# Patient Record
Sex: Female | Born: 1935
Health system: Southern US, Community
[De-identification: ages and names within clinical notes are randomized; demographics above are authoritative.]

## PROBLEM LIST (undated history)

## (undated) DIAGNOSIS — Z9889 Other specified postprocedural states: Secondary | ICD-10-CM

## (undated) DIAGNOSIS — Z8719 Personal history of other diseases of the digestive system: Secondary | ICD-10-CM

## (undated) DIAGNOSIS — I2510 Atherosclerotic heart disease of native coronary artery without angina pectoris: Secondary | ICD-10-CM

## (undated) DIAGNOSIS — F411 Generalized anxiety disorder: Secondary | ICD-10-CM

## (undated) DIAGNOSIS — H3530 Unspecified macular degeneration: Secondary | ICD-10-CM

## (undated) DIAGNOSIS — Z972 Presence of dental prosthetic device (complete) (partial): Secondary | ICD-10-CM

## (undated) DIAGNOSIS — I480 Paroxysmal atrial fibrillation: Secondary | ICD-10-CM

## (undated) DIAGNOSIS — Z955 Presence of coronary angioplasty implant and graft: Secondary | ICD-10-CM

## (undated) DIAGNOSIS — E039 Hypothyroidism, unspecified: Secondary | ICD-10-CM

## (undated) DIAGNOSIS — I5032 Chronic diastolic (congestive) heart failure: Secondary | ICD-10-CM

## (undated) DIAGNOSIS — M4620 Osteomyelitis of vertebra, site unspecified: Secondary | ICD-10-CM

## (undated) DIAGNOSIS — Z973 Presence of spectacles and contact lenses: Secondary | ICD-10-CM

## (undated) DIAGNOSIS — N183 Chronic kidney disease, stage 3 unspecified: Secondary | ICD-10-CM

## (undated) DIAGNOSIS — E7800 Pure hypercholesterolemia, unspecified: Secondary | ICD-10-CM

## (undated) DIAGNOSIS — F3289 Other specified depressive episodes: Secondary | ICD-10-CM

## (undated) DIAGNOSIS — D500 Iron deficiency anemia secondary to blood loss (chronic): Secondary | ICD-10-CM

## (undated) DIAGNOSIS — Z85820 Personal history of malignant melanoma of skin: Secondary | ICD-10-CM

## (undated) DIAGNOSIS — D126 Benign neoplasm of colon, unspecified: Secondary | ICD-10-CM

## (undated) DIAGNOSIS — I272 Pulmonary hypertension, unspecified: Secondary | ICD-10-CM

## (undated) DIAGNOSIS — C801 Malignant (primary) neoplasm, unspecified: Secondary | ICD-10-CM

## (undated) DIAGNOSIS — G062 Extradural and subdural abscess, unspecified: Secondary | ICD-10-CM

## (undated) DIAGNOSIS — Z933 Colostomy status: Secondary | ICD-10-CM

## (undated) DIAGNOSIS — K435 Parastomal hernia without obstruction or  gangrene: Secondary | ICD-10-CM

## (undated) DIAGNOSIS — K8020 Calculus of gallbladder without cholecystitis without obstruction: Secondary | ICD-10-CM

## (undated) DIAGNOSIS — K219 Gastro-esophageal reflux disease without esophagitis: Secondary | ICD-10-CM

## (undated) DIAGNOSIS — I10 Essential (primary) hypertension: Secondary | ICD-10-CM

## (undated) DIAGNOSIS — J189 Pneumonia, unspecified organism: Secondary | ICD-10-CM

## (undated) DIAGNOSIS — N261 Atrophy of kidney (terminal): Secondary | ICD-10-CM

## (undated) DIAGNOSIS — C189 Malignant neoplasm of colon, unspecified: Secondary | ICD-10-CM

## (undated) DIAGNOSIS — C182 Malignant neoplasm of ascending colon: Secondary | ICD-10-CM

## (undated) DIAGNOSIS — K5289 Other specified noninfective gastroenteritis and colitis: Secondary | ICD-10-CM

## (undated) DIAGNOSIS — R112 Nausea with vomiting, unspecified: Secondary | ICD-10-CM

## (undated) DIAGNOSIS — F329 Major depressive disorder, single episode, unspecified: Secondary | ICD-10-CM

## (undated) HISTORY — PX: ROTATOR CUFF REPAIR: SHX139

## (undated) HISTORY — DX: Malignant (primary) neoplasm, unspecified: C80.1

## (undated) HISTORY — DX: Atherosclerotic heart disease of native coronary artery without angina pectoris: I25.10

## (undated) HISTORY — DX: Osteomyelitis of vertebra, site unspecified: M46.20

## (undated) HISTORY — DX: Pulmonary hypertension, unspecified: I27.20

## (undated) HISTORY — DX: Benign neoplasm of colon, unspecified: D12.6

## (undated) HISTORY — DX: Other specified noninfective gastroenteritis and colitis: K52.89

## (undated) HISTORY — PX: TONSILLECTOMY: SUR1361

## (undated) HISTORY — PX: COLON SURGERY: SHX602

## (undated) HISTORY — DX: Pure hypercholesterolemia, unspecified: E78.00

## (undated) HISTORY — DX: Major depressive disorder, single episode, unspecified: F32.9

## (undated) HISTORY — DX: Unspecified macular degeneration: H35.30

## (undated) HISTORY — PX: SHOULDER ARTHROSCOPY: SHX128

## (undated) HISTORY — DX: Other specified depressive episodes: F32.89

## (undated) HISTORY — DX: Generalized anxiety disorder: F41.1

## (undated) HISTORY — DX: Malignant neoplasm of colon, unspecified: C18.9

## (undated) HISTORY — PX: CATARACT EXTRACTION W/ INTRAOCULAR LENS  IMPLANT, BILATERAL: SHX1307

## (undated) HISTORY — DX: Parastomal hernia without obstruction or gangrene: K43.5

## (undated) HISTORY — DX: Colostomy status: Z93.3

---

## 1970-04-01 HISTORY — PX: TUBAL LIGATION: SHX77

## 1989-04-01 HISTORY — PX: NISSEN FUNDOPLICATION: SHX2091

## 1999-06-26 ENCOUNTER — Encounter: Payer: Self-pay | Admitting: Orthopedic Surgery

## 1999-06-27 ENCOUNTER — Observation Stay (HOSPITAL_COMMUNITY): Admission: RE | Admit: 1999-06-27 | Discharge: 1999-06-28 | Payer: Self-pay | Admitting: Orthopedic Surgery

## 1999-10-12 ENCOUNTER — Encounter: Admission: RE | Admit: 1999-10-12 | Discharge: 1999-10-12 | Payer: Self-pay | Admitting: General Surgery

## 1999-10-12 ENCOUNTER — Encounter: Payer: Self-pay | Admitting: General Surgery

## 2002-08-10 ENCOUNTER — Encounter: Admission: RE | Admit: 2002-08-10 | Discharge: 2002-09-29 | Payer: Self-pay | Admitting: Orthopedic Surgery

## 2003-08-02 ENCOUNTER — Encounter: Admission: RE | Admit: 2003-08-02 | Discharge: 2003-08-02 | Payer: Self-pay | Admitting: Orthopedic Surgery

## 2003-09-08 ENCOUNTER — Encounter: Admission: RE | Admit: 2003-09-08 | Discharge: 2003-09-19 | Payer: Self-pay | Admitting: Orthopedic Surgery

## 2004-10-22 ENCOUNTER — Encounter: Admission: RE | Admit: 2004-10-22 | Discharge: 2004-11-22 | Payer: Self-pay | Admitting: *Deleted

## 2005-03-02 ENCOUNTER — Emergency Department (HOSPITAL_COMMUNITY): Admission: EM | Admit: 2005-03-02 | Discharge: 2005-03-03 | Payer: Self-pay | Admitting: Emergency Medicine

## 2006-04-03 ENCOUNTER — Ambulatory Visit: Payer: Self-pay | Admitting: Cardiology

## 2006-04-24 ENCOUNTER — Ambulatory Visit: Payer: Self-pay | Admitting: Cardiology

## 2006-08-05 ENCOUNTER — Ambulatory Visit: Payer: Self-pay | Admitting: Gastroenterology

## 2006-08-05 LAB — CONVERTED CEMR LAB
Iron: 133 ug/dL (ref 42–145)
Saturation Ratios: 35.9 % (ref 20.0–50.0)
Transferrin: 264.8 mg/dL (ref >=212.0)
Vitamin B-12: 812 pg/mL (ref 211–911)

## 2006-08-06 ENCOUNTER — Encounter: Payer: Self-pay | Admitting: Gastroenterology

## 2006-08-06 ENCOUNTER — Ambulatory Visit: Payer: Self-pay | Admitting: Gastroenterology

## 2006-09-01 ENCOUNTER — Ambulatory Visit: Payer: Self-pay | Admitting: Gastroenterology

## 2006-09-05 ENCOUNTER — Ambulatory Visit (HOSPITAL_COMMUNITY): Admission: RE | Admit: 2006-09-05 | Discharge: 2006-09-05 | Payer: Self-pay | Admitting: Gastroenterology

## 2006-09-23 ENCOUNTER — Ambulatory Visit: Payer: Self-pay | Admitting: Gastroenterology

## 2006-10-17 ENCOUNTER — Ambulatory Visit: Payer: Self-pay | Admitting: Gastroenterology

## 2006-10-23 ENCOUNTER — Ambulatory Visit (HOSPITAL_COMMUNITY): Admission: RE | Admit: 2006-10-23 | Discharge: 2006-10-23 | Payer: Self-pay | Admitting: Gastroenterology

## 2007-01-19 ENCOUNTER — Ambulatory Visit: Payer: Self-pay | Admitting: Cardiology

## 2007-01-22 ENCOUNTER — Encounter: Payer: Self-pay | Admitting: Cardiology

## 2007-02-23 ENCOUNTER — Ambulatory Visit: Payer: Self-pay | Admitting: Cardiology

## 2007-06-23 ENCOUNTER — Ambulatory Visit: Payer: Self-pay | Admitting: Cardiology

## 2007-07-16 DIAGNOSIS — E7800 Pure hypercholesterolemia, unspecified: Secondary | ICD-10-CM

## 2007-07-16 DIAGNOSIS — K2270 Barrett's esophagus without dysplasia: Secondary | ICD-10-CM

## 2007-07-16 DIAGNOSIS — F329 Major depressive disorder, single episode, unspecified: Secondary | ICD-10-CM

## 2007-07-16 DIAGNOSIS — K219 Gastro-esophageal reflux disease without esophagitis: Secondary | ICD-10-CM

## 2007-07-16 DIAGNOSIS — I509 Heart failure, unspecified: Secondary | ICD-10-CM | POA: Insufficient documentation

## 2007-07-16 DIAGNOSIS — K5900 Constipation, unspecified: Secondary | ICD-10-CM | POA: Insufficient documentation

## 2007-07-16 DIAGNOSIS — I519 Heart disease, unspecified: Secondary | ICD-10-CM

## 2007-07-16 DIAGNOSIS — F411 Generalized anxiety disorder: Secondary | ICD-10-CM

## 2007-07-16 HISTORY — DX: Gastro-esophageal reflux disease without esophagitis: K21.9

## 2007-11-03 ENCOUNTER — Encounter: Payer: Self-pay | Admitting: Gastroenterology

## 2008-04-27 ENCOUNTER — Encounter: Payer: Self-pay | Admitting: Cardiology

## 2008-05-12 ENCOUNTER — Ambulatory Visit: Payer: Self-pay | Admitting: Cardiology

## 2008-07-19 ENCOUNTER — Ambulatory Visit: Payer: Self-pay | Admitting: Cardiology

## 2008-07-20 ENCOUNTER — Encounter: Payer: Self-pay | Admitting: Cardiology

## 2008-07-27 ENCOUNTER — Ambulatory Visit: Payer: Self-pay | Admitting: Cardiology

## 2008-08-19 ENCOUNTER — Ambulatory Visit: Payer: Self-pay | Admitting: Cardiology

## 2008-09-01 ENCOUNTER — Encounter: Payer: Self-pay | Admitting: Cardiology

## 2009-02-22 ENCOUNTER — Ambulatory Visit: Payer: Self-pay | Admitting: Cardiology

## 2009-02-22 ENCOUNTER — Encounter (INDEPENDENT_AMBULATORY_CARE_PROVIDER_SITE_OTHER): Payer: Self-pay | Admitting: *Deleted

## 2009-02-22 DIAGNOSIS — R0789 Other chest pain: Secondary | ICD-10-CM

## 2009-12-01 ENCOUNTER — Ambulatory Visit: Payer: Self-pay | Admitting: Cardiology

## 2010-05-01 NOTE — Assessment & Plan Note (Signed)
Summary: 6 no fu per may reminder   Visit Type:  Follow-up Primary Provider:  Prudy Feeler, NP   History of Present Illness: patient is a 76 year old female with history of valvular heart disease and diastolic dysfunction. The patient actually remarkably well. Her weight is stable. Her blood pressures controlled. She reports no short of breath. She is very active. She denies any symptoms. Should his stress test done in April of last year which was within normal limits. She has no other cardiovascular complaints.  Note is that the patient has a melanoma survivor initially diagnosed in 82.  Preventive Screening-Counseling & Management  Alcohol-Tobacco     Smoking Status: quit     Year Quit: 1990  Current Medications (verified): 1)  Nexium 40 Mg  Cpdr (Esomeprazole Magnesium) .Marland Kitchen.. 1 Capsule Each Day 30 Minutes Before Meal 2)  Amlodipine Besylate 10 Mg Tabs (Amlodipine Besylate) .... Take 1 Tablet By Mouth Once A Day 3)  Furosemide 40 Mg Tabs (Furosemide) .... Take 1 Tablet By Mouth Once A Day 4)  Klor-Con M10 10 Meq Cr-Tabs (Potassium Chloride Crys Cr) .... Take 1 Tablet By Mouth Once A Day 5)  Diovan 320 Mg Tabs (Valsartan) .... Take One Tablet By Mouth Daily 6)  Fexofenadine Hcl 180 Mg Tabs (Fexofenadine Hcl) .... Take 1 Tablet By Mouth Every Morning 7)  Meloxicam 7.5 Mg Tabs (Meloxicam) .... Take 1 Tablet By Mouth Every Morning 8)  Boniva 150 Mg Tabs (Ibandronate Sodium) .... Take 1 Tablet By Mouth Monthly 9)  Crestor 5 Mg Tabs (Rosuvastatin Calcium) .... Take One Tablet By Mouth Daily. 10)  Niaspan 500 Mg Cr-Tabs (Niacin (Antihyperlipidemic)) .... Take 1 Tab By Mouth At Bedtime 11)  Citalopram Hydrobromide 40 Mg Tabs (Citalopram Hydrobromide) .... Take 1 Tab By Mouth At Bedtime 12)  Ambien 10 Mg Tabs (Zolpidem Tartrate) .... Take 1 Tab By Mouth At Bedtime 13)  Trazodone Hcl 100 Mg Tabs (Trazodone Hcl) .... Take 1 Tab By Mouth At Bedtime 14)  Xanax 0.25 Mg Tabs (Alprazolam) .... As  Needed 15)  Risperidone 1 Mg Tabs (Risperidone) .... Take 1 Tab By Mouth At Bedtime 16)  Iron 325 (65 Fe) Mg Tabs (Ferrous Sulfate) .... Take 1 Tablet By Mouth Every Morning 17)  Calcium Carbonate-Vitamin D 600-400 Mg-Unit Tabs (Calcium Carbonate-Vitamin D) .... Take 2 Tablets Every Morning. 18)  Aspirin Ec 325 Mg Tbec (Aspirin) .... Take One Tablet By Mouth Daily 19)  Icaps Mv  Tabs (Multiple Vitamins-Minerals) .... Take 1 Tablet By Mouth Two Times A Day 20)  Fish Oil 1000 Mg Caps (Omega-3 Fatty Acids) .... Take 2 Tablet By Mouth Two Times A Day  Allergies (verified): 1)  Sulfamethoxazole (Sulfamethoxazole)  Comments:  Nurse/Medical Assistant: The patient's medication list and allergies were reviewed with the patient and were updated in the Medication and Allergy Lists.  Past History:  Past Medical History: Last updated: July 23, 2008 Current Problems:  DEPRESSION, CHRONIC (ICD-311) ANXIETY (ICD-300.00) HYPERCHOLESTEROLEMIA (ICD-272.0) CONSTIPATION (ICD-564.00) GERD (ICD-530.81) BARRETTS ESOPHAGUS (ICD-530.85) DIASTOLIC DYSFUNCTION (ICD-429.9) CHF (ICD-428.0) Pulmonary hypertension Symptomatic bradycardia, on beta blocker. Valvular heart disease A.  moderate mitral and tricuspid regurgitation. Hypertension  Past Surgical History: Last updated: 07/16/2007 removal of back melanoma 1995 hiatal hernia repair 1991 rotator cuff repair x 2  Family History: Last updated: 07/23/2008 Father: sudden death at age 26, presumed secondary to myocardial infarction.  Social History: Last updated: July 23, 2008 Tobacco Use - No.  Alcohol Use - no  Risk Factors: Smoking Status: quit (12/01/2009)  Social History: Smoking  Status:  quit  Review of Systems  The patient denies fatigue, malaise, fever, weight gain/loss, vision loss, decreased hearing, hoarseness, chest pain, palpitations, shortness of breath, prolonged cough, wheezing, sleep apnea, coughing up blood, abdominal pain, blood  in stool, nausea, vomiting, diarrhea, heartburn, incontinence, blood in urine, muscle weakness, joint pain, leg swelling, rash, skin lesions, headache, fainting, dizziness, depression, anxiety, enlarged lymph nodes, easy bruising or bleeding, and environmental allergies.    Vital Signs:  Patient profile:   76 year old female Height:      64.5 inches Weight:      164 pounds BMI:     27.82 Pulse rate:   67 / minute BP sitting:   133 / 72  (left arm) Cuff size:   regular  Vitals Entered By: Carlye Grippe (December 01, 2009 8:59 AM)  Nutrition Counseling: Patient's BMI is greater than 25 and therefore counseled on weight management options.  Physical Exam  Additional Exam:  General: Well-developed, well-nourished in no distress head: Normocephalic and atraumatic eyes PERRLA/EOMI intact, conjunctiva and lids normal nose: No deformity or lesions mouth normal dentition, normal posterior pharynx neck: Supple, no JVD.  No masses, thyromegaly or abnormal cervical nodes lungs: Normal breath sounds bilaterally without wheezing.  Normal percussion heart: regular rate and rhythm with normal S1 and S2, no S3 or S4.  PMI is normal.  No pathological murmurs abdomen: Normal bowel sounds, abdomen is soft and nontender without masses, organomegaly or hernias noted.  No hepatosplenomegaly musculoskeletal: Back normal, normal gait muscle strength and tone normal pulsus: Pulse is normal in all 4 extremities Extremities: No peripheral pitting edema neurologic: Alert and oriented x 3 skin: Intact without lesions or rashes cervical nodes: No significant adenopathy psychologic: Normal affect    Impression & Recommendations:  Problem # 1:  CHEST DISCOMFORT (ICD-786.59) no recurrence. Negative stress test April 2010. Her updated medication list for this problem includes:    Amlodipine Besylate 10 Mg Tabs (Amlodipine besylate) .Marland Kitchen... Take 1 tablet by mouth once a day    Aspirin Ec 325 Mg Tbec (Aspirin)  .Marland Kitchen... Take one tablet by mouth daily  Problem # 2:  DEPRESSION, CHRONIC (ICD-311) on medical therapy and managed by her primary care physician.  Problem # 3:  DIASTOLIC DYSFUNCTION (ICD-429.9) no evidence of volume overload.  Patient Instructions: 1)  Your physician wants you to follow-up in: 1 year. You will receive a reminder letter in the mail one-two months in advance. If you don't receive a letter, please call our office to schedule the follow-up appointment. 2)  Your physician recommends that you continue on your current medications as directed. Please refer to the Current Medication list given to you today.

## 2010-08-14 NOTE — Assessment & Plan Note (Signed)
[facility]                          [facility] NOTE   NAME:Chipley, [name]                           MRN:          [id number]  DATE:01/19/2007                            DOB:          08-04-1934    CARDIOLOGIST:  Dr. Andee Lineman.   PRIMARY CARE PHYSICIAN:  Dr. Samuel Jester and her physician assistant,  Prudy Feeler.   SUMMARY/HISTORY:  Ms. Fairclough is a 76 year old white female who presents to  our office today for a 66-month followup.  However, she has also noticed  some increased dyspnea on exertion.   Since her hospitalization in January, she states that she initially did  well for the first several months.  Her Lasix and potassium prescription  ran out some time, she thinks, in April, and this has not been refilled.  Since June, she states that she has not felt quite as well.  She  describes this as a gradual worsening of her shortness of breath.  Her  schedule is very busy, for which she participates in senior aerobics on  Tuesday, walks on Wednesday, line dancing on Thursday, and piano playing  and cleaning her daughter's house on Friday.  By the end of the day, she  is fatigued and she notices pedal edema.  However, this does decrease at  night.  Since January, she has gained approximately 20 pounds.  She  feels, in the last several weeks, that her exertional shortness of  breath is getting worse, particularly due to decreased provocation.  However, she does not specifically need to stop her activities to rest.  For example, at line dancing, she just pushes through the activity and  takes a break when everybody takes a break.  She does not check her  blood pressure at home.  Rarely, she checks it at [facility] and says that  it ranges between 120s and 150s, but at bedtime cannot recall the last  time she has had this checked.   She specifically denies any problems with chest discomfort, orthopnea,  PND, palpitations, or syncope.  She denies  any other concerns.   PAST MEDICAL HISTORY:   ALLERGIES:  SULFA.   MEDICATIONS:  1. Os-Cal with vitamin D t.i.d.  2. Crestor 5 mg daily.  3. Niaspan 500 mg nightly.  4. Trazodone 100 daily.  5. Aspirin 325 daily.  6. Nexium 40 mg daily.  7. Tekturna 150 mg daily.  8. Diovan 320 mg daily.  9. Meloxicam 7.5 mg daily.  10.Fexofenadine 180 mg daily.  11.Boniva 150 monthly.  12.Citalopram 40 mg daily.  13.Zolpidem 10 mg daily.  14.Iron daily.   PAST MEDICAL HISTORY:  Notable for CHF secondary to diastolic  dysfunction with echocardiogram in January showing an EF of 55-60%  without definitive wall motion abnormalities.  She did have moderate MR  and TR with moderately severe left atrial enlargement and moderate  pulmonary hypertension, and trivial pericardial effusion.  At that time,  Toprol was discontinued secondary to bradycardia.  This had been  initiated in the past secondary to tachycardia.   EKG in our office  today shows sinus bradycardia with a ventricular rate  of 55.  Normal axis.  Nonspecific ST-T wave changes.  No apparent change  from old EKG.   PHYSICAL EXAM:  GENERAL:  Well-developed, well-nourished pleasant white  female who appears younger than her stated again, in no apparent  distress.  Blood pressure 170/76, heart rate is 58 and regular, weight is 149.2  pounds.  HEENT:  Unremarkable.  NECK:  Supple without thyromegaly, adenopathy, carotid bruits.  She does  have approximately 8 cm of JVD.  LUNGS:  Symmetrical excursion.  Clear to auscultation bilaterally.  HEART:  PMI is not displaced.  Regular rate and rhythm.  Possible S4.  She does have a very soft 2/6 systolic murmur best appreciated at the  base.  I do not appreciate any gallops, clicks, or rubs.  ABDOMEN:  Flat.  Bowel sounds present without organomegaly, masses, or  tenderness.  EXTREMITIES:  Negative cyanosis, clubbing, or edema.  All pulses were  symmetrical and intact.  NEURO:   Unremarkable.  MUSCULOSKELETAL:  Unremarkable.   IMPRESSION:  1. Dyspnea on exertion, probably related to chronic diastolic      dysfunction and hypertension.  2. Hypertension.  Does not appear to be well controlled just based on      this office visit.  3. Medication duplication regarding Tekturna and Diovan, which are      both angiotensin-receptor blockers.  History as noted previously.   DISPOSITION:  Dr. Andee Lineman reviewed the patient's history, spoke with and  examined the patient, and agrees with the above.  We have asked her to  discontinue the Tekturna as that this is an ARB and she is already on  diovan. I have written her a prescription for Norvasc 5 mg daily, as  well as resumed her Lasix 20 mg daily and her K-Dur 10 mEq daily.  I  have asked her to obtain a blood pressure cuff and begin a blood  pressure diary, and to return in 1 month for followup on her symptoms,  and to review her blood pressures.  We also discussed cardiac  catheterization for continued evaluation of her diastolic heart failure  with valvular disease.  However, I do not think that she needs cardiac  catheterization at this time.  She was agreeable  with the plan.  We will also check a BMET in 1 week to follow up on her  kidney function and potassium, given the Lasix and K-Dur initiation.      Joellyn Rued, PA-C  Electronically Signed      Learta Codding, MD,FACC  Electronically Signed   EW/MedQ  DD: 01/19/2007  DT: 01/20/2007  Job #: [id number]   cc:   Jolayne Panther, PA  Vania Rea Jarold Motto, MD, Caleen Essex, FAGA

## 2010-08-14 NOTE — Assessment & Plan Note (Signed)
[facility]                          [facility] NOTE   NAME:[name], [name]                           MRN:          [id number]  DATE:08/19/2008                            DOB:          [birth year]    REFERRING PHYSICIAN:  Prudy Feeler, PA   HISTORY OF PRESENT ILLNESS:  The patient is a 76 year old female with  history of chronic diastolic heart failure, hypertension, mitral  regurgitation, moderate pulmonary hypertension.  The patient was last  seen in the office on July 19, 2008, and reported dyspnea.  She denies  having any substernal chest pain.  The patient was set up for Cardiolite  stress test, which was on July 27, 2008, which showed no ischemia and  normal LV function.  Of note is also the patient has a history of  chronic depression and was recently started on Abilify with marked  improvement in her symptoms.  The patient stated I feel I have my body  back.  The patient actually is quite tearful today and is complaining  of less shortness of breath.  Of note is that also her Lasix was  increased during the last office visit due to her history of significant  diastolic heart failure.   MEDICATIONS:  1. Boniva 150 mg p.o. q. month.  2. Citalopram 40 mg p.o. daily.  3. Zolpidem 10 mg daily.  4. Iron 325/25 mg p.o. daily.  5. Furosemide 20 mg daily.  6. Klor-Con 10 mg p.o. daily.  7. Os-Cal.  8. Vitamin D 60 mg as directed.  9. Amlodipine besylate 10 mg p.o. daily.  10.Fish oil 4000 mg p.o. daily.  11.Abilify 10 mg p.o. daily.  12.AzaSite eye drops.   PHYSICAL EXAMINATION:  VITAL SIGNS:  Blood pressure of 142/78, heart  rate 55, and weight 170 pounds.  GENERAL:  A well-nourished white female in no apparent distress.  HEENT:  Pupils, eyes clear; conjunctivae clear.  NECK:  Supple.  Normal carotid upstroke.  No carotid bruits.  LUNGS:  Clear breath sounds bilaterally.  HEART:  Regular rate and rhythm with normal S1 and S2.  ABDOMEN:  Soft and nontender.  No rebound or guarding.  Good bowel  sounds.  EXTREMITIES:  No cyanosis, clubbing, or edema.  NEUROLOGIC:  The patient is alert, oriented, and grossly nonfocal.   PROBLEM LIST:  1. Chronic diastolic heart failure, improved with additional Lasix.  2. Moderate pulmonary hypertension secondary to elevated left atrial      pressure.  3. Bradycardia.  No evidence of chronotropic insufficiency.  4. Negative Cardiolite study with no evidence of ischemic heart      disease.  5. Depression, improved significantly with Abilify.   PLAN:  1. The patient has improved with increase of her Lasix.  She is less      short of breath.  We reviewed her Cardiolite stress study and there      is no evidence of ischemia.  No further testing is necessary.  2. The patient does have a significant mood disorder and has had a  dramatic response with the addition of Abilify.  3. From a cardiac standpoint, the patient appears to be now stable and      can be followed up in the office in 6 months.     Learta Codding, MD,FACC  Electronically Signed    GED/MedQ  DD: 08/21/2008  DT: 08/22/2008  Job #: [id number]   cc:   Prudy Feeler, PA

## 2010-08-14 NOTE — Assessment & Plan Note (Signed)
[facility] [place]                         [facility]STRO[facility]OLOGY OFFICE NOTE   NAME:Forti, ITZABELLA SORRELS                           MRN:          [id number]  DATE:10/17/2006                            DOB:          Aug 04, 1934    Stanley had an amazing response to treatment for bacterial overgrowth  syndrome with Xifaxan and Align probiotic therapy.  This lasted for  about 2 weeks, and then her complaints of abdominal gas, bloating, and  discomfort with nausea and early satiety returned.  She has had a Nissen  fundoplication by Dr. Jamey Ripa, and had a repeat endoscopy in May which  showed her fundoplication to be intact.  Small bowel biopsy at that time  was unremarkable and esophageal biopsy did show some [name]'s mucosa.  She is supposed to be taking Nexium, but apparently is out of this  medication.  She is on a variety of other multiple drugs listed and  reviewed in her chart.  She denies other neuromuscular or  gastrointestinal problems.  She currently has some mild constipation.  She has a very long, redundant colon noted on Aug 06, 2006 at the time of  her colonoscopy.  However, no other lesions were noted.   She weighs 141 pounds, which is up some 10 pounds from previous weight.  Blood pressure 130/72 and pulse was 60 and regular.  She is a healthy-appearing white female in no distress, appearing her  stated age.  There was no evidence of peripheral edema or stigmata of chronic liver  disease.  Her abdomen showed no distension, organomegaly, masses, or tenderness,  but she did have very loud, hyperactive bowel sounds.  Rectal exam was deferred.   ASSESSMENT:  I think Mrs. Chiarelli has a gastrointestinal motility disorder,  perhaps related to her fundoplication and vagotomy.  She had a rather  amazing response to treatment for bacterial overgrowth.  She has a GI  motility disorder and a current bacterial overgrowth syndrome.   RECOMMENDATIONS:  1. Given samples and  prescription for Nexium and reflux regimen      reviewed.  2. Check technetium-TC gastric emptying scan.  3. Depending on results of emptying scan, may need to place her on      metoclopramide therapy.  4. Depending on emptying scan results, will probably resume periodic      treatment with Xifaxan, and, in the interim, placed her on      erythromycin 1/4 of a tsp of 200 mg per 5 cc at bedtime as a      prokinetic agent.  5. Alternated probiotics, and today start her on Flora-Q for the next      several weeks, alternating      with Nutritional therapist.  6. Other medications per primary care physicians.     [name], MD, Caleen Essex, FAGA  Electronically Signed    DRP/MedQ  DD: 10/17/2006  DT: 10/17/2006  Job #: [id number]   cc:   Currie Paris, M.D.  Aniceto Boss, MD

## 2010-08-14 NOTE — Assessment & Plan Note (Signed)
[facility]                          [facility] NOTE   NAME:[name]                           MRN:          [id number]  DATE:06/23/2007                            DOB:          October 04, 1934    PRIMARY CARDIOLOGIST:  Learta Codding, MD.   REASON FOR VISIT:  Scheduled follow-up.   [name] returns for ongoing monitoring of chronic diastolic heart  failure, hypertension and mitral regurgitation. The patient also has  history of symptomatic bradycardia, which resolved with cessation of  Toprol.   Her last echocardiogram in January 2008 revealed normal LVF (55-60%),  with no segmental wall motion abnormalities.  There was moderate MR and  TR, and moderately severe left atrial enlargement.  There is also  moderate pulmonary hypertension (50 mmHg).   Clinically, the patient denies any history of chest pain.  In fact, she  continues to remain quite active and engaged in regular aerobics and  dance classes.  She denies any history of tobacco smoking.  She denies  any symptoms suggestive of PND, orthopnea or lower extremity edema.   The patient is reporting anxiety with respect to recurrent allergies,  however, and also seems to suggest that she has white coat  hypertension.  With respect to this issue, she states that her systolic  blood pressure typically runs in the high 130-140s range.  Apparently  she has not had any further adjustment of her medication regimen, since  we up-titrated her amlodipine to 10 mg daily at time of her last clinic  visit.   CURRENT MEDICATIONS:  1. Amlodipine 10 mg daily.  2. Furosemide 20 mg daily.  3. Potassium chloride 10 mEq daily.  4. Iron supplement.  5. Citalopram 40 mg daily.  6. Boniva monthly.  7. Crestor 5 mg daily.  8. Niaspan 500 mg nightly.  9. Trazodone 100 mg daily.  10.Full-dose aspirin.  11.Nexium.  12.Diovan 320 mg daily.  13.Fexofenadine 180 mg daily.  14.Meloxicam 7.5 mg daily.   PHYSICAL EXAM:  Blood pressure initially 149/77, 175 systolic  bilaterally, per my follow-up.  Heart rate 60, regular.  Weight 154.  GENERAL:  A 76 year old female sitting upright in no distress.  HEENT:  Normocephalic, atraumatic.  NECK:  Palpable bilateral carotid pulse without bruits; no JVD.  LUNGS:  Clear to auscultation in all fields.  HEART:  Regular rate and rhythm (S1, S2), no significant murmurs.  No  rubs.  ABDOMEN:  Soft, nontender, intact bowel sounds.  No upper quadrant  bruits.  EXTREMITIES:  Palpable dorsalis pedis pulses with no edema.  NEUROLOGIC:  Mildly anxious but no focal deficit.   IMPRESSION:  1. Chronic diastolic heart failure.      a.     Currently euvolemic.  2. Valvular heart disease.      a.     Moderate mitral regurgitation/tricuspid regurgitation by 2-D       echo, January 2008.  3. Hypertension, labile.  4. Moderate pulmonary hypertension.  5. History of symptomatic bradycardia, on Toprol.   PLAN:  1. Down-titrate aspirin from full  dose to 162 mg daily, 81 mg      indefinitely.  2. Patient advised to document her blood pressure at home with twice-      daily readings, and to then present this data to her primary care      physician for review. I also asked her to have a copy faxed to [place],      as well.  3. Recommend surveillance 2-D echocardiogram in 1 year, followed by      annual follow-up with Dr. Lewayne Bunting.      Gene Serpe, PA-C  Electronically Signed      Learta Codding, MD,FACC  Electronically Signed   GS/MedQ  DD: 06/23/2007  DT: 06/23/2007  Job #: [id number]   cc:   Prudy Feeler, PA-C  Samuel Jester

## 2010-08-14 NOTE — Assessment & Plan Note (Signed)
[facility]                          [facility] [facility]   NAME:[name], [name]                           MRN:          [id number]  DATE:02/23/2007                            DOB:          April 10, 1934    CARDIOLOGIST:  [name].   PRIMARY CARE [name]:  Heather Feeler, PA-C.   REASON FOR VISIT:  One-month followup.   HISTORY OF PRESENT ILLNESS:  [name] is a 76 year old female patient  with chronic diastolic congestive heart failure who presents to the  office today for followup.  Please see Heather Pugh, PA-C note from  January 19, 2007 for complete details.  Briefly, at that appointment,  she was taken off of her Tekturna and placed on Norvasc 5 mg a day.  She  had Lasix and potassium added to her medical regimen.  She returns today  for a followup.   The patient notes that she has had URI symptoms for the last couple of  weeks.  She has already seen [name] once and had antibiotics started.  She has completed these.  She denies much in the way of cough.  She  denies any fevers or chills.  She has mainly excessive post-nasal drip  and head congestion.   She thinks that her breathing is better since she was started back on  her Lasix.  As noted previously, she is very active and dances several  times a week.  She exhibits NYHA class I to II symptoms.  She denies  orthopnea, PND, or pedal edema.  She denies any chest pain.  She denies  any exertional chest heaviness or tightness, arm or jaw discomfort.   CURRENT MEDICATIONS:  1. Boniva 150 mg q. month.  2. Citalopram 40 mg daily.  3. Zolpidem 10 mg daily.  4. Iron 325 mg daily.  5. Furosemide 20 mg daily.  6. Klor-Con 10 mEq daily.  7. Amlodipine 5 mg daily.  8. Calcium.  9. Crestor 5 mg daily.  10.Niaspan 500 mg nightly.  11.Trazodone 100 mg daily.  12.Aspirin 325 mg daily.  13.Nexium 40 mg daily.  14.Diovan 320 mg daily.  15.Meloxicam 7.5 mg daily.  16.Fexofenadine 180  mg daily.  17.Xanax p.r.n.   PHYSICAL EXAM:  She is a well-nourished, well-developed female in no  acute distress.  Blood pressure 124/69, pulse 65, weight 148.8 pounds.  HEENT:  Normal.  NECK:  Without JVD.  LYMPHATICS:  Without lymphadenopathy.  CARDIAC:  Normal S1, S2.  Regular rate and rhythm without murmur.  LUNGS:  Clear to auscultation bilaterally.  ABDOMEN:  Soft and nontender.  EXTREMITIES:  Without edema.  Calves are soft and nontender.  SKIN:  Warm and dry.  NEUROLOGIC:  She is alert and oriented x3.  Cranial nerves 2-12 grossly  intact.   BLOOD PRESSURE DIARY:  The patient brings in the listing of her blood  pressures since last being seen.  Her pressures range anywhere from 156  to 118 over 84 to 67.  The majority of her blood pressures are in the  upper 130s to  lower 140s range systolically.   IMPRESSION:  1. Hypertension, better control.  2. Chronic diastolic congestive heart failure - optivolemic.  3. Good left ventricular function.  4. Moderate mitral regurgitation and moderate tricuspid regurgitation.  5. Moderate pulmonary hypertension.  6. Ex-smoker.  7. Hyperlipidemia.  8. History of symptomatic bradycardia - resolved off of beta blocker.  9. Recent upper respiratory infection.   PLAN:  The patient presents to the office today for followup.  Unfortunately, she is suffering from URI symptoms.  Overall, her  breathing seems to be better.  She did have followup blood work on  October 28 that revealed a stable creatinine at 1.2 and a potassium at  3.8.  She exhibits NYHA class I to II symptoms.  Her blood pressure is  better.  It is somewhat borderline.  I have elected to go ahead and  increase her Norvasc to 10 mg daily.  I think that is all she needs for  management of her blood pressure.  The question of cardiac  catheterization has been raised in the past.  I do not believe that is  necessary at this time, and I have discussed this  further with Dr.  Andee Richardson who agreed.  We will see her back in 3 months  for routine followup or sooner p.r.n.      Tereso Newcomer, PA-C  Electronically Signed      Heather Codding, MD,FACC  Electronically Signed   SW/MedQ  DD: 02/23/2007  DT: 02/23/2007  Job #: [id number]   cc:   Heather Feeler, PA-C

## 2010-08-14 NOTE — Assessment & Plan Note (Signed)
[place] [facility]                         GASTROENTEROLOGY OFFICE NOTE   NAME:[name]                           MRN:          [id number]  DATE:09/01/2006                            DOB:          [birth year]    [name] underwent endoscopy and colonoscopy on Aug 06, 2006.  She did  have a very short segment of [name]'s mucosa of distal esophagus with a  very intact fundoplication.  Small bowel biopsy was normal.  Colonoscopy  showed a very long, redundant, and tortuous colon.  Trials of Amitiza  did not alleviate her constipation.  She did not fill her Xifaxan  prescription as prescribed because of financial constraints.  She did  try probiotic therapy with Align daily x10 days, again without  improvement.   She continues with constipation, abdominal cramping, gas, and bloating.  However, on close questioning, she does go the bathroom some every day  and does not have to abuse laxatives.  She is on multiple medications  listed and reviewed in the chart.   She is a nontoxic, healthy-appearing white female in no distress.  She  weighs 132 pounds today which is up 6 pounds from her previous weight.  Blood pressure is 110/50 in her left arm and 110/60 in her right.  Pulse  was 72 and regular.  Abdominal exam showed no organomegaly, masses,  tenderness, or distention.  Bowel sounds were normal.   ASSESSMENT:  1. Chronic complaints of constipation with an anatomically very long,      torturous, redundant colon.  I suspect she has normal transit      constipation, however.  2. Possible element of bacterial overgrowth syndrome.  3. Multiple medications that are probably contributing to her      constipation.  4. Hypertensive cardiovascular disease followed at the [facility] and Dr. Samuel Jester and Aniceto Boss, PA.   RECOMMENDATIONS:  1. Discontinue HCTZ, calcium, and iron therapy while continuing other      medications.  2. Sitz  marker study to better anatomically define her type of      constipation.  3. Other medications as per Dr. Charm Barges.  4. Consider a small bowel series.  I will see her back in 2 weeks'      time for followup.     [name]. Heather Motto, MD, Caleen Essex, FAGA  Electronically Signed    DRP/MedQ  DD: 09/01/2006  DT: 09/01/2006  Job #: [id number]   cc:   Aniceto Boss, PA  Currie Paris, M.D.

## 2010-08-14 NOTE — Assessment & Plan Note (Signed)
[place] [facility]                         GASTROENTEROLOGY OFFICE NOTE   NAME:Richardson Richardson DIVEN                           MRN:          [id number]  DATE:09/23/2006                            DOB:          02/27/1935    [name] continues to have mild nausea and mild constipation.  Her appetite  is good and her weight is stable, and she really has no other systemic  or alarming complaints.   Her Sitz marker study was entirely normal, consistent with normal  transient constipation.  Thyroid function tests also were checked and  were normal.  As per my previous notes, we have held many of her  medications, but she still remains constipated and has some nausea.  I  have again reviewed all of her medications with her, and certainly feel  that some of these could be contributing to her nausea and constipation.  As above, she could not take Amitiza because of headaches.  She says  that she has tried lactose and MiraLax in the past without benefit.  This is somewhat surprising since her Sitz marker study was normal.   VITAL SIGNS:  Today are all normal.  GENERAL:  Physical exam was not repeated.   ASSESSMENT:  1. Normal transient constipation with possible element of underlying      bacterial overgrowth syndrome.  2. Chronic gastroesophageal reflux disease with known [name]'s mucosa      in her esophagus and no proton pump inhibitor therapy.  3. History of multiple medical problems as per my previous note.  4. Status post fundoplication surgery.   RECOMMENDATIONS:  1. Trial of Nexium 40 mg q. a.m.  2. High fiber diet with daily Perdiem with senna in the morning and      liberal p.o. fluids.  3. MiraLax as tolerated regularly at bedtime.  4. Empiric trial of Xifaxan 400 mg t.i.d. for a week along with Align      probiotic therapy.  She did not get this medication as previously      requested because of unanswered concerns.  5. Follow up in 1 month's time.     [name], MD, Caleen Essex, FAGA  Electronically Signed    DRP/MedQ  DD: 09/23/2006  DT: 09/23/2006  Job #: [id number]   cc:   Dr. Aniceto Boss  Currie Paris, M.D.

## 2010-08-14 NOTE — Assessment & Plan Note (Signed)
[facility]                          [facility]   NAME:[name]                           MRN:          [id number]  DATE:07/19/2008                            DOB:          1934-05-26    REFERRING PHYSICIAN:  Prudy Feeler   HISTORY OF PRESENT ILLNESS:  The patient is a 76 year old female with a  history of chronic diastolic heart failure, hypertension, mitral  regurgitation, and moderate pulmonary hypertension.  An echo in 2008  demonstrated moderate MR and TR and mildly severe left atrial  enlargement as well as moderate pulmonary hypertension.  Recent echo was  done, which showed essentially similar findings, although the mitral  regurgitation was slightly better, but there was significant left atrial  enlargement consistent with elevated left atrial pressure.  The patient  now reports that since late 2009 she experienced some chest tightness,  which is left-sided, which can lasts sometimes minutes to up to half an  hour.  It is both exertional and nonexertional.  Her previous problem,  however is a complaint of marked dyspnea and she feels short of breath  most of the time.  She tells me today that she took 2 boxes of trash can  this morning and became extremely dyspneic and also feels a sensation of  extreme fatigue.   MEDICATIONS:  1. Boniva 150 mg p.o. every month.  2. Citalopram 40 mg p.o. daily.  3. Zolpidem 5-10 mg p.o. daily.  4. Iron.  5. Furosemide 20 mg p.o. daily.  6. Klor-Con.  7. Os-Cal.  8. Amlodipine.  9. Vesanoid 10 mg p.o. daily.  10.Fish oil 4000 mg p.o. daily.  11.Crestor.   PHYSICAL EXAMINATION:  VITAL SIGNS:  Blood pressure 142/75, heart rate  59, weight is 169 pounds.  GENERAL:  Well nourished white female, in no apparent distress.  HEENT:  Pupils; eyes are clear.  Conjunctivae are clear.  NECK:  Supple.  No carotid upstroke.  No carotid bruits.  LUNGS:  Clear breath sounds bilaterally.  HEART:   Regular rate and rhythm.  Normal S1 and S2.  ABDOMEN:  Soft and nontender.  No rebound or guarding.  Good bowel  sounds.  EXTREMITIES:  No cyanosis or clubbing.  There is trace peripheral  pitting edema.   PROBLEM LIST:  1. Chronic diastolic heart failure.  2. Moderate pulmonary hypertension likely secondary to elevated left      atrial pressure and diastolic heart failure.  3. (unchanged however from 2008 on echocardiogram), mild mitral      regurgitation and tricuspid regurgitation.  4. Bradycardia.  Rule out chronotropic insufficiency.  5. Rule out coronary artery disease.   PLAN:  1. I have asked the patient to increase her Lasix to 40 mg a day as I      do think she has significant diastolic heart failure, which is      causing her dyspnea.  2. The patient will be set up for an exercise Cardiolite stress study      also to rule out the possibility of ischemic heart disease,  although I think this is less likely could also evaluate her at the      same time chronotropic insufficiency.  3. The patient can follow up with [place] in 1 month.     Learta Codding, MD,FACC  Electronically Signed    GED/MedQ  DD: 07/19/2008  DT: 07/20/2008  Job #: [id number]   cc:   Dr. Jolayne Panther

## 2010-08-17 NOTE — Assessment & Plan Note (Signed)
[facility]                          [facility]   NAME:[name], [name]                           MRN:          [id number]  DATE:04/24/2006                            DOB:          04/03/1934    PRIMARY CARDIOLOGIST:  Dr. Andee Lineman (new).   REASON FOR CONSULTATION:  [name] is a 76 year old female, with no  prior cardiac history, recently hospitalized here at [facility] with  congestive heart failure and symptomatic bradycardia.  She was not  formerly seen by our team in consultation during her brief stay, but is  now referred for post hospital follow up cardiology evaluation.   The patient presents with no past history of known coronary artery  disease or congestive heart failure.  Her cardiac risk factors are  notable for hypertension, hyperlipidemia, remote tobacco, and family  history of coronary artery disease.   The patient recalls feeling quite flushed and fatigued and a little  lightheaded and was referred to the emergency room where she was found  to be in congestive heart failure and in sinus bradycardia at a rate of  41 BPM.   The patient was hospitalized for 5 days, had no obvious evidence of  ischemia, and had a 2-D echocardiogram, reviewed by Dr. Andee Lineman,  revealing normal left ventricular function (EF of 55% - 60%) with no  definite WMAs.  There was; however, valvular heart disease with moderate  mitral regurgitation/tricuspid regurgitation with moderately severe left  atrial enlargement.  Moderate pulmonary hypertension (50 mmHg) was also  noted, with a trivial posterior pericardial effusion.   Medications were adjusted with discontinuation of Toprol, of which  patient reports to me today she had been on since 2001 for treatment of  sinus tachycardia, in addition of low dose diuretic with supplemental  potassium.   The patient has felt remarkably better since her recent hospitalization.  She reports no symptoms  suggestive of congestive heart failure and has  had no lightheadedness/near syncope.  She does have some chronic  exertional dyspnea, but no PND, orthopnea, or lower extremity edema.  The patient denies any remote, or recent, history of exertional chest  discomfort.   Electrocardiogram today reveals sinus bradycardia of 52 BPM with normal  axis and isolated PVC.   ALLERGIES:  SULFA   CURRENT MEDICATIONS.:  1. Aspirin 325 daily.  2. Lasix 20 daily.  3. KCL 10 daily.  4. Trazodone 100 daily.  5. Niaspan 500 daily.  6. Crestor 5 daily.  7. Iron 325 daily.  8. Allegra 180 daily.  9. Lexapro 20 daily.  10.Diovan 160 daily.  11.Fosamax 70 weekly.   PAST MEDICAL HISTORY:  1. Hypertension.  2. Hyperlipidemia.  3. Chronic anemia.  4. Osteoporosis.  5. Depression.   SURGICAL HISTORY:  Status post hiatal hernia repair, rotator cuff  surgery, and history of melanoma.   SOCIAL HISTORY:  The patient lives alone, and is quite self-sufficient.  She has not smoked tobacco since 1990 and denies alcohol use.   FAMILY HISTORY:  Her father died suddenly at age 67, most likely  secondary to myocardial infarction.   REVIEW OF SYSTEMS:  As noted per HPI.  The remaining systems negative.   PHYSICAL EXAMINATION:  Blood pressure 166/74, pulse 52, regular. Weight  121.9.  GENERAL:  A 76 year old female sitting upright in no distress.  HEENT:  Normocephalic, atraumatic.  NECK:  Palpable bilateral carotid pulse without bruits; no JVD at 90  degrees.  LUNGS:  Clear to auscultation in all fields.  HEART:  Regular rate and rhythm (S1 and S2).  Soft grade 2/6 systolic  murmur heard loudest at the base.  No gallops, clicks or rubs.  ABDOMEN:  Is soft, nontender __________ bowel sounds.  EXTREMITIES:  Palpable distal pulses with trace pedal edema.  NEURO:  No focal deficit.   IMPRESSION:  1. Status post diastolic heart failure.      a.     Ejection fraction of 55%-60% by recent 2-D echo.  2.  Status post symptomatic bradycardia.      a.     Most likely secondary to development of sinus node       dysfunction.      b.     Toprol recently discontinued.  3. Valvular heart disease.      a.     Moderate mitral regurgitation/tricuspid regurgitation.  4. Pulmonary hypertension.  5. History of tobacco.  6. Hyperlipidemia.  7. Hypertension.   PLAN:  1. Resume increased dose of Diovan at 320 daily for aggressive blood      pressure control.  2. Decrease aspirin to 81 mg daily for primary risk factor      modification.  3. Consider elective diagnostic coronary angiography, including right      heart catheterization, for further evaluation of noted significant      pulmonary hypertension and mitral regurgitation.  4. Schedule a return clinic followup with myself and Dr. Andee Lineman in the      next 3 months.      Gene Serpe, PA-C  Electronically Signed      Learta Codding, MD,FACC  Electronically Signed   GS/MedQ  DD: 04/24/2006  DT: 04/24/2006  Job #: [id number]   cc:   Samuel Jester

## 2010-08-17 NOTE — Assessment & Plan Note (Signed)
[place] [facility]                         GASTROENTEROLOGY OFFICE NOTE   NAME:[name], [name]                           MRN:          [id number]  DATE:08/05/2006                            DOB:          1934/10/18    REASON FOR VISIT:  [name] is a 76 year old white female retiree from  [place].  [name] is referred by Prudy Feeler, P.A. for  evaluation of multiple GI complaints, mostly surrounding chronic  constipation with gas and bloating.   HISTORY OF PRESENT ILLNESS:  [name] has a very long and involved  gastroenterological history and I do not have records for review.  Apparently for many years, [name] was under the care of Dr. Nadine Counts Buccini for  refractory acid reflux disease and had a fundoplication performed in  1991 by Dr. Cicero Duck.  [name] really had no reflux symptoms since that  time but has had rather marked gas, bloating and rather severe  constipation.  [name] relates that [name] may go up to two to three weeks at a  time without a bowel movement.  [name] says has tried all laxatives and  medications including Amitiza without relief.  The constipation is  rather severe with gas, bloating and rather foul flatus.  [name] denies any  specific hepatobiliary complaints and relates that [name] had a gallbladder  ultrasound some two years ago that was normal.  [name] also relates that  [name] had a colonoscopy by Dr. Cleotis Nipper at [facility] 2 years ago  that was unremarkable.  His records are not available for review.   Her other main complaint is one of constant nausea with occasional  emesis of bilious-like material.  [name] denies real anorexia and has had a  voluntary weight loss because of cardiac concerns.  [name] denies melena or  hematochezia, fever, chills or any specific hepatobiliary complaints.  [name] has never had known hepatitis or pancreatitis.  [name] denies abuse of  Sorbitol or Fructose and has no known history of lactose intolerance.  [name]  does suffer from rather severe osteoporosis, however.   PAST MEDICAL HISTORY:  Remarkable for well controlled essential  hypertension followed by Dr. Andee Lineman and a history of cardiac  arrhythmia's that sound mostly like bradycardia related to beta blocker  use.  [name] also has had hypercholesterolemia, chronic anxiety and  depression, removal of a back melanoma in 1995, hiatal hernia repair in  1991 and several rotator cuff operations.   MEDICATIONS:  1. Diovan 320 mg a day.  2. Lexapro 20 mg at bedtime.  3. Fexofenadine 180 mg at bedtime.  4. Iron 65 mg a day.  5. Crestor 5 mg daily.  6. Niaspan 500 mg at bedtime.  7. Zolpidem 10 mg at bedtime.  8. Trazodone 100 mg at bedtime.  9. Boniva 20 mg a month.  10.Meloxicam 7.5 mg daily.  11.Tekturana 150 mg daily.  12.Hydrochlorothiazide 25 mg daily.  13.Calcium with vitamin D, two a day.  14.Aspirin 325 mg daily.  15.P.r.n. alprazolam and Amitiza use.   ALLERGIES:  [name], in the past, has had reactions to  SULFA with rash and  edema.   FAMILY HISTORY:  Remarkable for atherogenesis in her father and several  members with diabetes.   SOCIAL HISTORY:  The patient is divorced and lives alone.  [name] does not  smoke and denies ethanol abuse.   REVIEW OF SYSTEMS:  Surprisingly otherwise noncontributory except for  some edema of her lower extremities.  [name] denies any current  CARDIOVASCULAR, PULMONARY, GENITOURINARY, NEUROLOGICAL, other  ORTHOPEDIC, ENDOCRINE or NEUROPSYCHIATRIC problems.   CLINICAL DATA:  Recent work up [facility] was remarkable for  a normal CBC except for a platelet count of 133,000, normal metabolic  profile including liver function tests, hemoglobin of 10.9 with  normochromic, normocytic indices.   PHYSICAL EXAMINATION:  GENERAL APPEARANCE:  [name] is a healthy-appearing  white female appearing her stated age.  VITAL SIGNS:  [name] is 5 feet, 5 inches tall and weighs 127 pounds.  Blood  pressure is 120/70.   Pulse was 64 and regular.  I could not appreciate  stigmata of chronic liver disease and there was no thyromegaly or  lymphadenopathy.  CHEST:  Clear.  CARDIOVASCULAR:  [name] appeared to be in a regular rhythm without murmurs,  rubs or gallops.  ABDOMEN:  Flat, nontender without distention or organomegaly.  There are  very active high-pitched loud bowel sounds noted.  EXTREMITIES:  Peripheral extremities were unremarkable.  PSYCHIATRIC:  Mental status was clear.  RECTAL:  Exam showed no masses or tenderness.  There was no stool in the  rectal vault but what was present was Guaiac negative.   ASSESSMENT:  1. Severe chronic functional constipation with probable colonic atony      which may be worsened by increased amounts of fiber in her diet.      We need to consider bacterial overgrowth syndrome as a contributing      factor to her problems.  2. Rule out underlying endocrine metabolic disorders.  3. Status post fundoplication for refractory acid reflux.  4. Rule out celiac disease.  5. History of hypertension, hypercholesterolemia.  6. History of chronic anxiety and depression.  7. Status post melanoma removal from her back.  8. Status post multiple orthopedic procedures.  9. Rather severe osteoporosis - rule out celiac disease.   RECOMMENDATIONS:  1. Check screening laboratory parameters including celiac panel.  2. Outpatient endoscopy with small bowel biopsy.  3. Repeat colonoscopy exam.  4. Consider SITZ marker study to assess type of constipation.  5. Continue other medications per Prudy Feeler, P.A.     [name], MD, Caleen Essex, FAGA  Electronically Signed    DRP/MedQ  DD: 08/05/2006  DT: 08/05/2006  Job #: [id number]

## 2011-01-31 ENCOUNTER — Institutional Professional Consult (permissible substitution): Payer: Self-pay | Admitting: Cardiology

## 2011-02-04 ENCOUNTER — Encounter: Payer: Self-pay | Admitting: *Deleted

## 2011-02-04 ENCOUNTER — Encounter: Payer: Self-pay | Admitting: Cardiology

## 2011-02-05 ENCOUNTER — Ambulatory Visit (INDEPENDENT_AMBULATORY_CARE_PROVIDER_SITE_OTHER): Payer: Medicare Other | Admitting: Physician Assistant

## 2011-02-05 ENCOUNTER — Encounter: Payer: Self-pay | Admitting: Cardiology

## 2011-02-05 VITALS — BP 137/75 | HR 60 | Ht 64.5 in | Wt 167.0 lb

## 2011-02-05 DIAGNOSIS — I509 Heart failure, unspecified: Secondary | ICD-10-CM

## 2011-02-05 DIAGNOSIS — R001 Bradycardia, unspecified: Secondary | ICD-10-CM | POA: Insufficient documentation

## 2011-02-05 DIAGNOSIS — R002 Palpitations: Secondary | ICD-10-CM | POA: Insufficient documentation

## 2011-02-05 DIAGNOSIS — I10 Essential (primary) hypertension: Secondary | ICD-10-CM | POA: Insufficient documentation

## 2011-02-05 DIAGNOSIS — R0609 Other forms of dyspnea: Secondary | ICD-10-CM

## 2011-02-05 DIAGNOSIS — I498 Other specified cardiac arrhythmias: Secondary | ICD-10-CM

## 2011-02-05 NOTE — Assessment & Plan Note (Signed)
We'll order a 21 day continuous monitor to rule out dysrhythmia. Will check baseline labs, including TSH. We'll schedule return visit in one month, for review of studies and further recommendations.

## 2011-02-05 NOTE — Assessment & Plan Note (Signed)
Followed by Prudy Feeler, NP

## 2011-02-05 NOTE — Patient Instructions (Signed)
Follow up as scheduled. Your physician recommends that you continue on your current medications as directed. Please refer to the Current Medication list given to you today. Your physician recommends that you go to the [facility] for lab work: BMET/TSH Your physician has requested that you have an echocardiogram. Echocardiography is a painless test that uses sound waves to create images of your heart. It provides your doctor with information about the size and shape of your heart and how well your heart's chambers and valves are working. This procedure takes approximately one hour. There are no restrictions for this procedure. Your physician has recommended that you wear an event monitor. Event monitors are medical devices that record the heart's electrical activity. Doctors most often [place] these monitors to diagnose arrhythmias. Arrhythmias are problems with the speed or rhythm of the heartbeat. The monitor is a small, portable device. You can wear one while you do your normal daily activities. This is usually used to diagnose what is causing palpitations/syncope (passing out). If the results of your test are normal or stable, you will receive a letter. If they are abnormal, the nurse will contact you by phone.

## 2011-02-05 NOTE — Assessment & Plan Note (Signed)
Previously documented; on no rate controlling agents

## 2011-02-05 NOTE — Assessment & Plan Note (Signed)
Stable on current medication regimen 

## 2011-02-05 NOTE — Progress Notes (Signed)
Cc: Irregular heartbeat   HPI: Patient returns to clinic, since last visit, November 2011, with Dr. Andee Lineman. Recently found to have an "irregular" heartbeat on PE, and now referred for further evaluation.  Patient recently seen for evaluation of possible sinusitis, and has been placed on antibiotics. During the exam, it was noted that her heart rhythm sounded "irregular". An EKG was not ordered. The patient, herself, suggests new onset palpitations, oftentimes lasting 20 minutes in duration, with some associated dizziness. She denies frank syncope or falls.  Patient also complains of DOE, but no exertional CP. She has no known history of CAD, and has not undergone prior coronary angiography. She had a negative stress test, April 2010. She has history of diastolic dysfunction and moderate mitral and tricuspid regurgitation.   PMH: reviewed and listed in Problem List in electronic Records (and see below)  Allergies/SH/FH: available in Electronic Records for review  Current Outpatient Prescriptions  Medication Sig Dispense Refill  . ALPRAZolam (XANAX) 0.25 MG tablet Take 0.25 mg by mouth at bedtime as needed.        Marland Kitchen amLODipine (NORVASC) 10 MG tablet Take 10 mg by mouth daily.        Marland Kitchen aspirin 325 MG tablet Take 325 mg by mouth daily.        . citalopram (CELEXA) 40 MG tablet Take 40 mg by mouth daily.        Marland Kitchen esomeprazole (NEXIUM) 40 MG capsule Take 40 mg by mouth daily before breakfast.        . ferrous sulfate 325 (65 FE) MG EC tablet Take 325 mg by mouth daily with breakfast.        . fexofenadine (ALLEGRA) 180 MG tablet Take 180 mg by mouth daily.        . furosemide (LASIX) 20 MG tablet Take 20 mg by mouth daily.        Marland Kitchen ibandronate (BONIVA) 150 MG tablet Take 150 mg by mouth every 30 (thirty) days. Take in the morning with a full glass of water, on an empty stomach, and do not take anything else by mouth or lie down for the next 30 min.       Marland Kitchen LUTEIN PO Take 1 tablet by mouth daily.         . meloxicam (MOBIC) 7.5 MG tablet Take 7.5 mg by mouth daily.        . niacin (NIASPAN) 500 MG CR tablet Take 500 mg by mouth at bedtime.        . NON FORMULARY Take 0.5 tablets by mouth daily. Active Senior just 1       . omega-3 acid ethyl esters (LOVAZA) 1 G capsule Take 1 g by mouth daily.        . potassium chloride (KLOR-CON) 10 MEQ CR tablet Take 10 mEq by mouth daily.        . rosuvastatin (CRESTOR) 5 MG tablet Take 5 mg by mouth daily.        . traZODone (DESYREL) 100 MG tablet Take 100 mg by mouth at bedtime.        . valsartan (DIOVAN) 320 MG tablet Take 320 mg by mouth daily.        Marland Kitchen zolpidem (AMBIEN) 10 MG tablet Take 10 mg by mouth at bedtime.          ROS: no nausea, vomiting; no fever, chills; no melena, hematochezia; no claudication; no frank syncope/falls  PHYSICAL EXAM:  Ht 5' 4.5" (1.638 m)  Wt 167 lb (75.751 kg)  BMI 28.22 kg/m2  GENERAL: 76 year-old female, sitting upright; NAD HEENT: NCAT, PERRLA, EOMI; sclera clear; no xanthelasma NECK: palpable bilateral carotid pulses, no bruits; no JVD; no TM LUNGS: CTA bilaterally CARDIAC: RRR (S1, S2); no significant murmurs; no rubs or gallops ABDOMEN: soft, non-tender; intact BS EXTREMETIES: intact distal pulses; no significant peripheral edema SKIN: warm/dry; no obvious rash/lesions MUSCULOSKELETAL: no joint deformity NEURO: no focal deficit; NL affect   EKG: reviewed and available in Electronic Records    ASSESSMENT & PLAN:

## 2011-02-05 NOTE — Assessment & Plan Note (Signed)
We'll further assess with a 2-D echocardiogram, given history of moderate MR/TR and diastolic dysfunction. She had a negative stress test, 4/10, and has no known CAD.

## 2011-02-07 DIAGNOSIS — R002 Palpitations: Secondary | ICD-10-CM

## 2011-02-14 ENCOUNTER — Other Ambulatory Visit (INDEPENDENT_AMBULATORY_CARE_PROVIDER_SITE_OTHER): Payer: Medicare Other | Admitting: *Deleted

## 2011-02-14 DIAGNOSIS — I509 Heart failure, unspecified: Secondary | ICD-10-CM

## 2011-02-14 DIAGNOSIS — R002 Palpitations: Secondary | ICD-10-CM

## 2011-02-14 DIAGNOSIS — R0609 Other forms of dyspnea: Secondary | ICD-10-CM

## 2011-02-14 DIAGNOSIS — R0989 Other specified symptoms and signs involving the circulatory and respiratory systems: Secondary | ICD-10-CM

## 2011-03-06 ENCOUNTER — Encounter: Payer: Self-pay | Admitting: *Deleted

## 2011-03-06 ENCOUNTER — Telehealth: Payer: Self-pay | Admitting: *Deleted

## 2011-03-06 NOTE — Telephone Encounter (Signed)
Per Gene Serpe, PA-C's review of [employer]: "NSR w/freq PVCs (& beat NSVT ~150 bpm) No Afib/flutter."  Pt notified of results and verbalized understanding.

## 2011-03-18 ENCOUNTER — Encounter: Payer: Self-pay | Admitting: Cardiology

## 2011-03-18 ENCOUNTER — Ambulatory Visit (INDEPENDENT_AMBULATORY_CARE_PROVIDER_SITE_OTHER): Payer: Medicare Other | Admitting: Cardiology

## 2011-03-18 ENCOUNTER — Encounter: Payer: Self-pay | Admitting: *Deleted

## 2011-03-18 VITALS — BP 115/69 | HR 70 | Ht 64.0 in | Wt 169.0 lb

## 2011-03-18 DIAGNOSIS — I10 Essential (primary) hypertension: Secondary | ICD-10-CM

## 2011-03-18 DIAGNOSIS — R0602 Shortness of breath: Secondary | ICD-10-CM

## 2011-03-18 DIAGNOSIS — I2789 Other specified pulmonary heart diseases: Secondary | ICD-10-CM

## 2011-03-18 DIAGNOSIS — I272 Pulmonary hypertension, unspecified: Secondary | ICD-10-CM

## 2011-03-18 DIAGNOSIS — I519 Heart disease, unspecified: Secondary | ICD-10-CM

## 2011-03-18 DIAGNOSIS — R002 Palpitations: Secondary | ICD-10-CM

## 2011-03-18 NOTE — Assessment & Plan Note (Signed)
Pulmonary hypertension by echocardiogram. Patient has a history of use of anorexic medications. This will need further evaluation with a right heart catheterization. The patient also will undergo a GXT to assess her function capacity.

## 2011-03-18 NOTE — Assessment & Plan Note (Signed)
Blood pressure is well controlled 

## 2011-03-18 NOTE — Patient Instructions (Addendum)
   GXT (exercise stress test) this week If the results of your test are normal or stable, you will receive a letter.  If they are abnormal, the nurse will contact you by phone. Right heart cath - 2nd week of January 2013 - will call when scheduled Your physician wants you to follow up in:  3 months.  You will receive a reminder letter in the mail one-two months in advance.  If you don't receive a letter, please call our office to schedule the follow up appointment

## 2011-03-18 NOTE — Progress Notes (Signed)
Heather Bottoms, MD, [facility] ABIM Board Certified in Adult Cardiovascular Medicine,Internal Medicine and Critical Care Medicine    CC: Followup patient with a history of palpitations and no shortness of breath.  HPI:  The patient is a 76 year old female with no known history of coronary artery disease. She had a negative stress test in April 2010. By echocardiogram shows an ejection fraction of 55-60% with moderate tricuspid regurgitation and moderate to severe pulmonary hypertension. PA systolic pressures are 55-65 mm of mercury. She has diastolic dysfunction.  Palpitations appear to have resolved clinically. She status post an event monitor which only showed occasional PACs and PVCs. Heart rate was consistently between 60 and 80 beats per minute. She feels that her palpitations have significantly improved.  The main clinical complaint the patient has said over the last month she has become increasingly short of breath. 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 She was able to do disease with a year ago. She is wondering if this would secondary to deconditioning as she doesn't feel like doing much but on the other hand she is afraid of getting short of breath when doing something. She denies any chest pain. She doesn't element of anxiety and depression and has had a difficult social situation with her sister for the last year and a half.  Of note is that the patient took about 30-40 years ago for approximately 5 year anorexic medications. She cannot recall the medications.  She denies any chest pain orthopnea PND, presyncope or syncope   PMH: reviewed and listed in Problem List in Electronic Records (and see below) Past Medical History  Diagnosis Date  . Other chest pain   . Depressive disorder, not elsewhere classified   . Heart disease, unspecified   . Anxiety state, unspecified   . Pure hypercholesterolemia   . Esophageal reflux   . Infection  of esophagostomy   . Congestive heart failure, unspecified   . Pulmonary hypertension   . Symptomatic bradycardia     on beta blocker  . Valvular heart disease   . Mitral regurgitation   . Tricuspid regurgitation   . Melanoma 1995    removal on back    Past Surgical History  Procedure Date  . Hiatal hernia repair   . Rotator cuff repair      Allergies/SH/FHX : available in Electronic Records for review  Medications: Current Outpatient Prescriptions  Medication Sig Dispense Refill  . ALPRAZolam (XANAX) 0.25 MG tablet Take 0.25 mg by mouth at bedtime as needed.        Marland Kitchen amLODipine (NORVASC) 10 MG tablet Take 10 mg by mouth daily.        Marland Kitchen aspirin 325 MG tablet Take 325 mg by mouth daily.        . citalopram (CELEXA) 40 MG tablet Take 40 mg by mouth daily.        Marland Kitchen esomeprazole (NEXIUM) 40 MG capsule Take 40 mg by mouth daily before breakfast.        . ferrous sulfate 325 (65 FE) MG EC tablet Take 325 mg by mouth daily with breakfast.        . fexofenadine (ALLEGRA) 180 MG tablet Take 180 mg by mouth daily.        . furosemide (LASIX) 20 MG tablet Take 20 mg by mouth daily.        Marland Kitchen ibandronate (BONIVA) 150 MG tablet Take 150 mg by mouth every  30 (thirty) days. Take in the morning with a full glass of water, on an empty stomach, and do not take anything else by mouth or lie down for the next 30 min.       Marland Kitchen losartan (COZAAR) 100 MG tablet Take 100 mg by mouth daily.        . LUTEIN PO Take 1 tablet by mouth daily.        . meloxicam (MOBIC) 7.5 MG tablet Take 7.5 mg by mouth daily.        . niacin (NIASPAN) 500 MG CR tablet Take 500 mg by mouth at bedtime.        . NON FORMULARY Take 0.5 tablets by mouth daily. Active Senior just 1       . omega-3 acid ethyl esters (LOVAZA) 1 G capsule Take 1 g by mouth daily.        . potassium chloride (KLOR-CON) 10 MEQ CR tablet Take 10 mEq by mouth daily.        . rosuvastatin (CRESTOR) 5 MG tablet Take 5 mg by mouth daily.        . traZODone  (DESYREL) 100 MG tablet Take 100 mg by mouth at bedtime.        Marland Kitchen zolpidem (AMBIEN) 10 MG tablet Take 10 mg by mouth at bedtime.          ROS: No nausea or vomiting. No fever or chills.No melena or hematochezia.No bleeding.No claudication.  Physical Exam: BP 115/69  Pulse 70  Ht 5\' 4"  (1.626 m)  Wt 169 lb (76.658 kg)  BMI 29.01 kg/m2 General: Well-nourished white female in no apparent distress. Neck: Normal carotid upstroke no carotid bruits. No thyromegaly nonnodular thyroid. JVP was difficult to evaluate Lungs: Clear breath sounds bilaterally without wheezing Cardiac: Regular rate and rhythm with normal S1-S2 and no murmur rubs or gallops. I could not hear a holosystolic murmur of tricuspid regurgitation Vascular: No edema. Normal distal pulses Skin:  12lead ECG: Not obtained Limited bedside ECHO:N/A   Assessment and Plan

## 2011-03-18 NOTE — Assessment & Plan Note (Signed)
The patient has significant shortness of breath and this may be secondary to diastolic dysfunction although the last echo report did not comment on the degree of diastolic dysfunction.

## 2011-03-18 NOTE — Assessment & Plan Note (Signed)
Evaluated with an event monitor and no significant abnormalities.

## 2011-03-19 ENCOUNTER — Other Ambulatory Visit: Payer: Self-pay | Admitting: Cardiology

## 2011-03-19 DIAGNOSIS — R0602 Shortness of breath: Secondary | ICD-10-CM

## 2011-03-21 DIAGNOSIS — R0602 Shortness of breath: Secondary | ICD-10-CM

## 2011-04-09 ENCOUNTER — Encounter: Payer: Self-pay | Admitting: *Deleted

## 2011-04-09 ENCOUNTER — Other Ambulatory Visit: Payer: Self-pay | Admitting: *Deleted

## 2011-04-09 ENCOUNTER — Telehealth: Payer: Self-pay | Admitting: *Deleted

## 2011-04-09 DIAGNOSIS — R0602 Shortness of breath: Secondary | ICD-10-CM

## 2011-04-09 DIAGNOSIS — I270 Primary pulmonary hypertension: Secondary | ICD-10-CM

## 2011-04-09 DIAGNOSIS — Z01810 Encounter for preprocedural cardiovascular examination: Secondary | ICD-10-CM

## 2011-04-09 NOTE — Telephone Encounter (Signed)
Message copied by Murriel Hopper on Tue Apr 09, 2011  3:15 PM ------      Message from: Carmelina Paddock      Created: Tue Apr 09, 2011 12:01 PM       No precert required            Thank you,            Morrie Sheldon            ----- Message -----         From: Hoover Brunette, LPN         Sent: 04/09/2011  11:48 AM           To: Megan Salon, Charmaine M Hall            Right JV heart cath scheduled for Friday, 04/12/2011 at 10:30 with Dr. Elease Hashimoto.            Vicky still out.              thanks

## 2011-04-11 ENCOUNTER — Other Ambulatory Visit: Payer: Self-pay | Admitting: Cardiology

## 2011-04-11 ENCOUNTER — Other Ambulatory Visit: Payer: Self-pay | Admitting: Physician Assistant

## 2011-04-11 DIAGNOSIS — I270 Primary pulmonary hypertension: Secondary | ICD-10-CM

## 2011-04-12 ENCOUNTER — Other Ambulatory Visit: Payer: Self-pay | Admitting: Physician Assistant

## 2011-04-12 ENCOUNTER — Encounter (HOSPITAL_BASED_OUTPATIENT_CLINIC_OR_DEPARTMENT_OTHER)
Admission: RE | Disposition: A | Discharge status: Home / Self Care | Payer: Self-pay | Source: Ambulatory Visit | Attending: Cardiovascular Disease

## 2011-04-12 ENCOUNTER — Inpatient Hospital Stay (HOSPITAL_BASED_OUTPATIENT_CLINIC_OR_DEPARTMENT_OTHER)
Admission: RE | Admit: 2011-04-12 | Discharge: 2011-04-12 | Disposition: A | Discharge status: Home / Self Care | Payer: Medicare Other | Source: Ambulatory Visit | Attending: Cardiovascular Disease | Admitting: Cardiovascular Disease

## 2011-04-12 ENCOUNTER — Encounter (HOSPITAL_BASED_OUTPATIENT_CLINIC_OR_DEPARTMENT_OTHER): Payer: Self-pay | Admitting: Cardiovascular Disease

## 2011-04-12 DIAGNOSIS — I2510 Atherosclerotic heart disease of native coronary artery without angina pectoris: Secondary | ICD-10-CM

## 2011-04-12 DIAGNOSIS — I270 Primary pulmonary hypertension: Secondary | ICD-10-CM

## 2011-04-12 DIAGNOSIS — R0609 Other forms of dyspnea: Secondary | ICD-10-CM | POA: Insufficient documentation

## 2011-04-12 DIAGNOSIS — R0989 Other specified symptoms and signs involving the circulatory and respiratory systems: Secondary | ICD-10-CM | POA: Insufficient documentation

## 2011-04-12 SURGERY — JV RIGHT HEART CATHETERIZATION
Anesthesia: Moderate Sedation

## 2011-04-12 MED ORDER — SODIUM CHLORIDE 0.9 % IV SOLN
INTRAVENOUS | Status: DC
  Administered 2011-04-12: 10:00:00 via INTRAVENOUS

## 2011-04-12 MED ORDER — ASPIRIN 81 MG PO CHEW
324.0000 mg | CHEWABLE_TABLET | ORAL | Status: AC
  Administered 2011-04-12: 324 mg via ORAL

## 2011-04-12 MED ORDER — DIAZEPAM 5 MG PO TABS
5.0000 mg | ORAL_TABLET | ORAL | Status: AC
  Administered 2011-04-12: 5 mg via ORAL

## 2011-04-12 NOTE — Op Note (Addendum)
    Cardiac Cath Note  [name] [id number] 25-Oct-1934  Procedure: Right and Left Heart Cardiac Catheterization Note Indications: Dyspnea  Procedure Details Consent: Obtained Time Out: Verified patient identification, verified procedure, site/side was marked, verified correct patient position, special equipment/implants available, Radiology Safety Procedures followed,  medications/allergies/relevent history reviewed, required imaging and test results available.  Performed   Medications: Fentanyl: 25 mcg IV Versed: 2 mg IV  The right femoral artery was easily canulated using a modified Seldinger technique.  Hemodynamics:   RA: 11/8/7 RV: 35/11 PCWP: 14/11/10 PA:  33/10/20  Cardiac Output   Thermodilution: 3.7  Index 2.0  Fick : 4.3 lpm,  Index of 2.4  Arterial Sat: 94 % PA Sat: 62%.  LV pressure: 137/23 Aortic pressure: 136/53  Angiography   Left Main: There is moderate to severe calcification in the mid and distal left main. The calcification appears to be extrinsic to the vessel and there is no significant luminal compromise.  Left anterior Descending: The proximal left anterior descending artery is moderately calcified. There is a 60-70% stenosis at the origin of the left anterior descending artery. This is followed by an he sent her, irregular stenosis of between 70 and 75%. It is very difficult to visualize. The remaining LAD has minor little regular disease. They're several small diagonal branches which have only minor luminal regular disease.  Left Circumflex: The left circumflex artery is a moderate-sized vessel. It gives off a large first obtuse marginal artery which is normal. The second 2 2 artery is moderate-sized  artery which is tortuous but is otherwise unremarkable.  Right Coronary Artery: The right coronary artery is heavily calcified throughout its course. We're able to visualize the course of the entire right coronary artery even without contrast.  Coronary angiography revealed that the vessel is moderate to large in size and is dominant. There are proximal luminal or right leg is between 20 and 30%. The mid vessel has sequential 40% stenoses. The posterior lateral branch and posterior descending artery are free of critical disease.  LV Gram: The left ventriculogram was performed in the 30 RAO position. It reveals overall well-preserved left systolic function. There is some question of a mid left ventricular outflow tract obstruction. No gradient was measured at this is just visualize on the left ventricular gram. The overall ejection fraction is normal at 65 and 70%.  Complications: No apparent complications Patient did tolerate procedure well.   Contrast used: 70 cc  Conclusions:   1. Moderate to severe coronary artery disease primarily involving the left anterior descending artery and right coronary artery.  The sequential stenosis in the left anterior descending artery circumflex could be ischemic. This will most likely need further evaluation.  The right coronary artery is moderate in severity and is likely not to be ischemic.  2. Normal left ventricular systolic function. The mid LV OT is hyperdynamic and under certain conditions she may have a minute LVOT obstruction. This was not demonstrated during the heart catheterization.  The right heart pressures are at the upper limits of normal. The patient does have an elevated wedge pressure and may need additional diuresis.  [name], [name]., MD, [facility] 04/12/2011, 12:29 PM

## 2011-04-12 NOTE — Interval H&P Note (Signed)
History and Physical Interval Note:  04/12/2011 12:26 PM  [name]  has presented today for surgery, with the diagnosis of sob,                phtn  The various methods of treatment have been discussed with the patient and family. After consideration of risks, benefits and other options for treatment, the patient has consented to  Procedure(s): JV RIGHT HEART CATHETERIZATION as a surgical intervention .  The patients' history has been reviewed, patient examined, no change in status, stable for surgery.  I have reviewed the patients' chart and labs.  Questions were answered to the patient's satisfaction.     [name].

## 2011-04-12 NOTE — Interval H&P Note (Signed)
History and Physical Interval Note:  04/12/2011 12:27 PM  [name]  has presented today for surgery, with the diagnosis of sob,                phtn  The various methods of treatment have been discussed with the patient and family. After consideration of risks, benefits and other options for treatment, the patient has consented to  Procedure(s): JV RIGHT and Left HEART CATHETERIZATION as a surgical intervention .  The patients' history has been reviewed, patient examined, no change in status, stable for surgery.  I have reviewed the patients' chart and labs.  Questions were answered to the patient's satisfaction.     [name].

## 2011-04-12 NOTE — H&P (View-Only) (Signed)
 Heather De Gent, MD, FACC ABIM Board Certified in Adult Cardiovascular Medicine,Internal Medicine and Critical Care Medicine    CC: Followup patient with a history of palpitations and no shortness of breath.  HPI:  The patient is a 77-year-old female with no known history of coronary artery disease. She had a negative stress test in April 2010. By echocardiogram shows an ejection fraction of 55-60% with moderate tricuspid regurgitation and moderate to severe pulmonary hypertension. PA systolic pressures are 55-65 mm of mercury. She has diastolic dysfunction.  Palpitations appear to have resolved clinically. She status post an event monitor which only showed occasional PACs and PVCs. Heart rate was consistently between 60 and 80 beats per minute. She feels that her palpitations have significantly improved.  The main clinical complaint the patient has said over the last month she has become increasingly short of breath. 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 She was able to do disease with a year ago. She is wondering if this would secondary to deconditioning as she doesn't feel like doing much but on the other hand she is afraid of getting short of breath when doing something. She denies any chest pain. She doesn't element of anxiety and depression and has had a difficult social situation with her sister for the last year and a half.  Of note is that the patient took about 30-40 years ago for approximately 5 year anorexic medications. She cannot recall the medications.  She denies any chest pain orthopnea PND, presyncope or syncope   PMH: reviewed and listed in Problem List in Electronic Records (and see below) Past Medical History  Diagnosis Date  . Other chest pain   . Depressive disorder, not elsewhere classified   . Heart disease, unspecified   . Anxiety state, unspecified   . Pure hypercholesterolemia   . Esophageal reflux   . Infection  of esophagostomy   . Congestive heart failure, unspecified   . Pulmonary hypertension   . Symptomatic bradycardia     on beta blocker  . Valvular heart disease   . Mitral regurgitation   . Tricuspid regurgitation   . Melanoma 1995    removal on back    Past Surgical History  Procedure Date  . Hiatal hernia repair   . Rotator cuff repair      Allergies/SH/FHX : available in Electronic Records for review  Medications: Current Outpatient Prescriptions  Medication Sig Dispense Refill  . ALPRAZolam (XANAX) 0.25 MG tablet Take 0.25 mg by mouth at bedtime as needed.        . amLODipine (NORVASC) 10 MG tablet Take 10 mg by mouth daily.        . aspirin 325 MG tablet Take 325 mg by mouth daily.        . citalopram (CELEXA) 40 MG tablet Take 40 mg by mouth daily.        . esomeprazole (NEXIUM) 40 MG capsule Take 40 mg by mouth daily before breakfast.        . ferrous sulfate 325 (65 FE) MG EC tablet Take 325 mg by mouth daily with breakfast.        . fexofenadine (ALLEGRA) 180 MG tablet Take 180 mg by mouth daily.        . furosemide (LASIX) 20 MG tablet Take 20 mg by mouth daily.        . ibandronate (BONIVA) 150 MG tablet Take 150 mg by mouth every   30 (thirty) days. Take in the morning with a full glass of water, on an empty stomach, and do not take anything else by mouth or lie down for the next 30 min.       . losartan (COZAAR) 100 MG tablet Take 100 mg by mouth daily.        . LUTEIN PO Take 1 tablet by mouth daily.        . meloxicam (MOBIC) 7.5 MG tablet Take 7.5 mg by mouth daily.        . niacin (NIASPAN) 500 MG CR tablet Take 500 mg by mouth at bedtime.        . NON FORMULARY Take 0.5 tablets by mouth daily. Active Senior just 1       . omega-3 acid ethyl esters (LOVAZA) 1 G capsule Take 1 g by mouth daily.        . potassium chloride (KLOR-CON) 10 MEQ CR tablet Take 10 mEq by mouth daily.        . rosuvastatin (CRESTOR) 5 MG tablet Take 5 mg by mouth daily.        . traZODone  (DESYREL) 100 MG tablet Take 100 mg by mouth at bedtime.        . zolpidem (AMBIEN) 10 MG tablet Take 10 mg by mouth at bedtime.          ROS: No nausea or vomiting. No fever or chills.No melena or hematochezia.No bleeding.No claudication.  Physical Exam: BP 115/69  Pulse 70  Ht 5' 4" (1.626 m)  Wt 169 lb (76.658 kg)  BMI 29.01 kg/m2 General: Well-nourished white female in no apparent distress. Neck: Normal carotid upstroke no carotid bruits. No thyromegaly nonnodular thyroid. JVP was difficult to evaluate Lungs: Clear breath sounds bilaterally without wheezing Cardiac: Regular rate and rhythm with normal S1-S2 and no murmur rubs or gallops. I could not hear a holosystolic murmur of tricuspid regurgitation Vascular: No edema. Normal distal pulses Skin:  12lead ECG: Not obtained Limited bedside ECHO:N/A   Assessment and Plan    

## 2011-04-12 NOTE — Progress Notes (Signed)
Bedrest begins @ 1245.  Tegaderm dressing applied. Level 0 right groin.

## 2011-04-12 NOTE — Brief Op Note (Addendum)
    Cardiac Cath Note  [name] [id number] Jun 25, 1934  Procedure: Right and Left Heart Cardiac Catheterization Note Indications: Dyspnea  Procedure Details Consent: Obtained Time Out: Verified patient identification, verified procedure, site/side was marked, verified correct patient position, special equipment/implants available, Radiology Safety Procedures followed,  medications/allergies/relevent history reviewed, required imaging and test results available.  Performed   Medications: Fentanyl: 25 mcg IV Versed: 2 mg IV  The right femoral artery was easily canulated using a modified Seldinger technique.  Hemodynamics:   RA: 11/8/7 RV: 35/11 PCWP: 14/11/10 PA:  33/10/20  Cardiac Output   Thermodilution: 3.7  Index 2.0  Fick : 4.3 lpm,  Index of 2.4  Arterial Sat: 94 % PA Sat: 62%.  LV pressure: 137/23 Aortic pressure: 136/53  Angiography   Left Main: There is moderate to severe calcification in the mid and distal left main. The calcification appears to be extrinsic to the vessel and there is no significant luminal compromise.  Left anterior Descending: The proximal left anterior descending artery is moderately calcified. There is a 60-70% stenosis at the origin of the left anterior descending artery. This is followed by an he sent her, irregular stenosis of between 70 and 75%. It is very difficult to visualize. The remaining LAD has minor little regular disease. They're several small diagonal branches which have only minor luminal regular disease.  Left Circumflex: The left circumflex artery is a moderate-sized vessel. It gives off a large first obtuse marginal artery which is normal. The second 2 2 artery is moderate-sized  artery which is tortuous but is otherwise unremarkable.  Right Coronary Artery: The right coronary artery is heavily calcified throughout its course. We're able to visualize the course of the entire right coronary artery even without contrast.  Coronary angiography revealed that the vessel is moderate to large in size and is dominant. There are proximal luminal or right leg is between 20 and 30%. The mid vessel has sequential 40% stenoses. The posterior lateral branch and posterior descending artery are free of critical disease.  LV Gram: The left ventriculogram was performed in the 30 RAO position. It reveals overall well-preserved left systolic function. There is some question of a mid left ventricular outflow tract obstruction. No gradient was measured at this is just visualize on the left ventricular gram. The overall ejection fraction is normal at 65 and 70%.  Complications: No apparent complications Patient did tolerate procedure well.  Conclusions:   1. Moderate to severe coronary artery disease primarily involving the left anterior descending artery and right coronary artery.  The sequential stenosis in the left anterior descending artery circumflex could be ischemic. This will most likely need further evaluation.  The right coronary artery is moderate in severity and is likely not to be ischemic.  2. Normal left ventricular systolic function. The mid LV OT is hyperdynamic and under certain conditions she may have a minute LVOT obstruction. This was not demonstrated during the heart catheterization.  The right heart pressures are at the upper limits of normal. The patient does have an elevated wedge pressure and may need additional diuresis.  [name], [name]., MD, [facility] 04/12/2011, 12:29 PM

## 2011-04-12 NOTE — Progress Notes (Signed)
Discharge instructions completed, patient voiced verbal understanding.  Ambulated to bathroom without bleeding from roght groin site.  Discharged to home via wheelchair with daughter.

## 2011-04-15 LAB — POCT I-STAT 3, VENOUS BLOOD GAS (G3P V)
Bicarbonate: 24.9 mEq/L — ABNORMAL HIGH (ref 20.0–24.0)
O2 Saturation: 62 %
TCO2: 26 mmol/L (ref 0–100)
pCO2, Ven: 45.4 mmHg (ref 45.0–50.0)
pH, Ven: 7.347 — ABNORMAL HIGH (ref 7.250–7.300)
pO2, Ven: 34 mmHg (ref 30.0–45.0)

## 2011-04-29 ENCOUNTER — Ambulatory Visit (INDEPENDENT_AMBULATORY_CARE_PROVIDER_SITE_OTHER): Payer: Medicare Other | Admitting: Cardiology

## 2011-04-29 ENCOUNTER — Encounter: Payer: Self-pay | Admitting: Cardiology

## 2011-04-29 VITALS — BP 125/75 | HR 60 | Ht 64.0 in | Wt 173.0 lb

## 2011-04-29 DIAGNOSIS — I071 Rheumatic tricuspid insufficiency: Secondary | ICD-10-CM

## 2011-04-29 DIAGNOSIS — R072 Precordial pain: Secondary | ICD-10-CM

## 2011-04-29 DIAGNOSIS — I498 Other specified cardiac arrhythmias: Secondary | ICD-10-CM

## 2011-04-29 DIAGNOSIS — R001 Bradycardia, unspecified: Secondary | ICD-10-CM

## 2011-04-29 DIAGNOSIS — I2510 Atherosclerotic heart disease of native coronary artery without angina pectoris: Secondary | ICD-10-CM

## 2011-04-29 DIAGNOSIS — R1031 Right lower quadrant pain: Secondary | ICD-10-CM

## 2011-04-29 DIAGNOSIS — R0602 Shortness of breath: Secondary | ICD-10-CM

## 2011-04-29 DIAGNOSIS — I079 Rheumatic tricuspid valve disease, unspecified: Secondary | ICD-10-CM

## 2011-04-29 MED ORDER — NITROGLYCERIN 0.4 MG SL SUBL: 0.4000 mg | SUBLINGUAL_TABLET | SUBLINGUAL | Status: DC | PRN

## 2011-04-29 MED ORDER — ISOSORBIDE MONONITRATE ER 30 MG PO TB24: 30.0000 mg | ORAL_TABLET | Freq: Every day | ORAL | Status: DC

## 2011-04-29 MED ORDER — CARVEDILOL 3.125 MG PO TABS: 3.1250 mg | ORAL_TABLET | Freq: Two times a day (BID) | ORAL | Status: DC

## 2011-04-29 NOTE — Patient Instructions (Signed)
   Imdur 30mg  daily  Coreg 3.125mg  twice a day   Nitroglycerin as needed for severe chest pain and/or shortness of breath  Right arterial doppler this morning

## 2011-05-02 ENCOUNTER — Encounter: Payer: Self-pay | Admitting: *Deleted

## 2011-05-02 NOTE — Progress Notes (Signed)
Patient ID: [name], female   DOB: [birth year], 77 y.o.   MRN: [id number]  PCI scheduled for Wednesday, February 6 at 2:00.  Patient to arrive at 12:00 noon.  Main lab - [facility] with Dr. Kirke Corin.  Patient informed & instructions given.

## 2011-05-03 ENCOUNTER — Telehealth: Payer: Self-pay | Admitting: *Deleted

## 2011-05-03 ENCOUNTER — Encounter (HOSPITAL_COMMUNITY): Payer: Self-pay | Admitting: Pharmacy Technician

## 2011-05-03 MED ORDER — CLOPIDOGREL BISULFATE 75 MG PO TABS: 75.0000 mg | ORAL_TABLET | Freq: Every day | ORAL | Status: DC

## 2011-05-03 NOTE — Telephone Encounter (Signed)
Patient notified and verbalized understanding.  Will send new rx to [facility].

## 2011-05-03 NOTE — Telephone Encounter (Signed)
Message copied by Murriel Hopper on Fri May 03, 2011 10:16 AM ------      Message from: Lorine Bears A      Created: Fri May 03, 2011  8:54 AM       I did the orders. I want her to be started on Plavix 75 mg once daily starting today. # 30 with 6 refills.             ----- Message -----         From: Hoover Brunette, LPN         Sent: 05/02/2011   3:41 PM           To: Iran Ouch, MD            Please do orders for PCI on patient.  Scheduled for Wednesday, 2/6 at 2:00.              Thanks.

## 2011-05-03 NOTE — Telephone Encounter (Signed)
Left message to return call 

## 2011-05-04 ENCOUNTER — Encounter: Payer: Self-pay | Admitting: Cardiology

## 2011-05-04 DIAGNOSIS — R001 Bradycardia, unspecified: Secondary | ICD-10-CM | POA: Insufficient documentation

## 2011-05-04 DIAGNOSIS — I2510 Atherosclerotic heart disease of native coronary artery without angina pectoris: Secondary | ICD-10-CM | POA: Insufficient documentation

## 2011-05-04 NOTE — Progress Notes (Signed)
Heather Bottoms, MD, [facility] Board Certified in Adult Cardiovascular Medicine,Internal Medicine and Critical Care Medicine    CC: followup patient has a recent catheterization with significant coronary artery disease  HPI:  The patient is a 77 year old female referred for cardiac catheterization because of dyspnea and exertion and chest tightness.  She has a history of use of anorexic agents and has valvular heart disease and a right heart catheterization was requested to rule out pulmonary hypertension.  She does have moderate tricuspid regurgitation.  Despite echocardiographic findings of significant pulmonary hypertension, right heart catheterization demonstrated normal PA pressures.  However, the patient did have significant coronary artery disease with sequential lesions in the LAD, in particularly the very high-grade mid LAD lesion sequentially after a more moderate ostial LAD lesion.  There was also significant disease in the distal RCA but less likely to be a cause of ischemia.  There appeared to be a small outflow gradient but no Brockenbrough maneuver was performed.  There was no prior evidence of outflow gradient by echocardiogram.  The patient also does not have the physical examination consistent with outflow obstruction. She reports post cardiac catheterization that showed some swelling and pain in the right groin.  The patient was examined today to rule out pseudoaneurysm or AV fistula. She reports that she continues to have shortness of breath on exertion and some mild chest pain also on exertion, but otherwise asymptomatic at rest. I reviewed the diagnostic cardiac catheterization images with Dr. Kirke Corin, we then went to see the patient and talked with her.  After brief discussion and after extensively reviewing the angiographic images we felt that the patient should be referred for intervention to the LAD.  PMH: reviewed and listed in Problem List in Electronic Records (and see  below) Past Medical History  Diagnosis Date  . Depressive disorder, not elsewhere classified   . Anxiety state, unspecified   . Pure hypercholesterolemia   . Esophageal reflux   . Infection of esophagostomy   . Congestive heart failure, unspecified     diastolic heart failure  . Pulmonary hypertension     PA systolic pressure 55-65 mmHg by echocardiogram, PA pressure 33/10 by cardiac catheterization PA saturation 62% thermodilution cardiac index 2.0 thick cardiac index 2.4  . Symptomatic bradycardia     on beta blocker  . Tricuspid regurgitation     moderate tricuspid regurgitation by echocardiogram, prior use of anorexic agents  . Melanoma 1995    removal on back   . Coronary artery disease     Moderate to severe coronary disease involving the left anterior descending artery and right coronary artery.  Sequential stenosis in the LAD is significant.  Right coronary artery is moderate to severely likely nonischemic.,  Questionable small LVOT obstruction.  No Brockenbrough maneuver was performed.  Catheterization January 2013   Past Surgical History  Procedure Date  . Hiatal hernia repair   . Rotator cuff repair     Allergies/SH/FHX : available in Electronic Records for review  Allergies  Allergen Reactions  . Sulfamethoxazole     REACTION: rash   History   Social History  . Marital Status: Divorced    Spouse Name: N/A    Number of Children: N/A  . Years of Education: N/A   Occupational History  . Not on file.   Social History Main Topics  . Smoking status: Former Smoker -- 1.0 packs/day for 20 years    Types: Cigarettes    Quit date: 04/01/1988  .  Smokeless tobacco: Never Used  . Alcohol Use: No  . Drug Use: No  . Sexually Active: Not on file   Other Topics Concern  . Not on file   Social History Narrative  . No narrative on file   Family History  Problem Relation Age of Onset  . Heart attack Father     Medications: Current Outpatient Prescriptions   Medication Sig Dispense Refill  . ALPRAZolam (XANAX) 0.25 MG tablet Take 0.25 mg by mouth at bedtime as needed.        Marland Kitchen amLODipine (NORVASC) 10 MG tablet Take 10 mg by mouth daily.        Marland Kitchen aspirin 325 MG tablet Take 325 mg by mouth daily.        . citalopram (CELEXA) 40 MG tablet Take 40 mg by mouth daily.        Marland Kitchen esomeprazole (NEXIUM) 40 MG capsule Take 40 mg by mouth daily before breakfast.        . ferrous sulfate 325 (65 FE) MG EC tablet Take 325 mg by mouth daily with breakfast.        . fexofenadine (ALLEGRA) 180 MG tablet Take 180 mg by mouth daily.        . furosemide (LASIX) 20 MG tablet Take 20 mg by mouth daily.        Marland Kitchen ibandronate (BONIVA) 150 MG tablet Take 150 mg by mouth every 30 (thirty) days. Take in the morning with a full glass of water, on an empty stomach, and do not take anything else by mouth or lie down for the next 30 min.       Marland Kitchen losartan (COZAAR) 100 MG tablet Take 100 mg by mouth daily.        . LUTEIN PO Take 1 tablet by mouth daily.        . meloxicam (MOBIC) 7.5 MG tablet Take 7.5 mg by mouth daily.        . niacin (NIASPAN) 500 MG CR tablet Take 500 mg by mouth at bedtime.        Marland Kitchen omega-3 acid ethyl esters (LOVAZA) 1 G capsule Take 1 g by mouth daily.        . potassium chloride (KLOR-CON) 10 MEQ CR tablet Take 10 mEq by mouth daily.        . rosuvastatin (CRESTOR) 5 MG tablet Take 5 mg by mouth daily.        . traZODone (DESYREL) 100 MG tablet Take 100 mg by mouth at bedtime.        Marland Kitchen zolpidem (AMBIEN) 10 MG tablet Take 10 mg by mouth at bedtime.        . carvedilol (COREG) 3.125 MG tablet Take 1 tablet (3.125 mg total) by mouth 2 (two) times daily with a meal.  60 tablet  6  . clopidogrel (PLAVIX) 75 MG tablet Take 1 tablet (75 mg total) by mouth daily.  30 tablet  6  . isosorbide mononitrate (IMDUR) 30 MG 24 hr tablet Take 1 tablet (30 mg total) by mouth daily.  30 tablet  6  . Multiple Vitamin (MULITIVITAMIN WITH MINERALS) TABS Take 1 tablet by mouth  daily. Centrum silver (equate)      . nitroGLYCERIN (NITROSTAT) 0.4 MG SL tablet Place 1 tablet (0.4 mg total) under the tongue every 5 (five) minutes as needed for chest pain.  25 tablet  3    ROS: No nausea or vomiting. No fever or chills.No melena or hematochezia.No  bleeding.No claudication  Physical Exam: BP 125/75  Pulse 60  Ht 5\' 4"  (1.626 m)  Wt 173 lb (78.472 kg)  BMI 29.70 kg/m2 The patient was examined in the lower extremities to rule out pseudoaneurysm.  The patient was placed in a gown.  The nurse assisted me in the exam. The patient had swelling and somewhat of a pulsatile mass size of a golf ball in the right groin.  There was a systolic bruit present, over the right femoral artery.  No bruit was heard over the left femoral artery.  12lead ECG:not performed Limited bedside ECHO:N/A   Patient Active Problem List  Diagnoses   . HYPERCHOLESTEROLEMIA   . ANXIETY   . DEPRESSION, CHRONIC   . CHF   . DIASTOLIC DYSFUNCTION-normal LV filling pressures   . GERD   . BARRETTS ESOPHAGUS   . CONSTIPATION   . CHEST DISCOMFORT   . DOE (dyspnea on exertion)-likely angina equivalent   . Palpitations   . HTN (hypertension)   . Sinus bradycardia   . Pulmonary hypertension-moderate by echocardiogram and no significant pulmonary hypertension by cardiac catheterization   . Coronary artery disease-significant CAD as outlined above   . Tricuspid regurgitation-moderate   .   Symptomatic bradycardia Right groin pain-rule out pseudoaneurysm    PLAN   Diagnostic images were reviewed and given the patient's ongoing symptoms the patient will be scheduled for an intervention to the LAD as outlined above.  Risks and benefits of the procedure were discussed with the patient and she is willing to proceed.  In the interim the patient will be placed on Coreg, isosorbide mononitrate and when necessary nitroglycerin.  The patient has an abnormal bruit in the right groin  and she will be referred for a arterial Doppler to rule out pseudoaneurysm.  Patient will be referred for coronary intervention next week.    Addendum: Patient already had arterial Doppler studies done and there was no presence of a pseudoaneurysm in the right groin.

## 2011-05-06 ENCOUNTER — Telehealth: Payer: Self-pay | Admitting: *Deleted

## 2011-05-06 NOTE — Telephone Encounter (Signed)
LAD/PCI ON 2/6 with Dr. Kirke Corin Main Lab Checking percert

## 2011-05-06 NOTE — Telephone Encounter (Signed)
Pt has MCR&MCD, no precert required. °

## 2011-05-08 ENCOUNTER — Ambulatory Visit (HOSPITAL_COMMUNITY)
Admission: RE | Admit: 2011-05-08 | Discharge: 2011-05-09 | Disposition: A | Discharge status: Home / Self Care | Payer: Medicare Other | Source: Ambulatory Visit | Attending: Cardiovascular Disease | Admitting: Cardiovascular Disease

## 2011-05-08 ENCOUNTER — Other Ambulatory Visit: Payer: Self-pay

## 2011-05-08 ENCOUNTER — Encounter (HOSPITAL_COMMUNITY)
Admission: RE | Disposition: A | Discharge status: Home / Self Care | Payer: Self-pay | Source: Ambulatory Visit | Attending: Cardiovascular Disease

## 2011-05-08 ENCOUNTER — Encounter (HOSPITAL_COMMUNITY): Payer: Self-pay | Admitting: General Practice

## 2011-05-08 DIAGNOSIS — F3289 Other specified depressive episodes: Secondary | ICD-10-CM | POA: Insufficient documentation

## 2011-05-08 DIAGNOSIS — I10 Essential (primary) hypertension: Secondary | ICD-10-CM | POA: Insufficient documentation

## 2011-05-08 DIAGNOSIS — I5030 Unspecified diastolic (congestive) heart failure: Secondary | ICD-10-CM | POA: Insufficient documentation

## 2011-05-08 DIAGNOSIS — I509 Heart failure, unspecified: Secondary | ICD-10-CM | POA: Insufficient documentation

## 2011-05-08 DIAGNOSIS — I498 Other specified cardiac arrhythmias: Secondary | ICD-10-CM | POA: Insufficient documentation

## 2011-05-08 DIAGNOSIS — Z955 Presence of coronary angioplasty implant and graft: Secondary | ICD-10-CM

## 2011-05-08 DIAGNOSIS — I079 Rheumatic tricuspid valve disease, unspecified: Secondary | ICD-10-CM | POA: Insufficient documentation

## 2011-05-08 DIAGNOSIS — K219 Gastro-esophageal reflux disease without esophagitis: Secondary | ICD-10-CM | POA: Insufficient documentation

## 2011-05-08 DIAGNOSIS — F329 Major depressive disorder, single episode, unspecified: Secondary | ICD-10-CM | POA: Insufficient documentation

## 2011-05-08 DIAGNOSIS — Z85820 Personal history of malignant melanoma of skin: Secondary | ICD-10-CM | POA: Insufficient documentation

## 2011-05-08 DIAGNOSIS — I2510 Atherosclerotic heart disease of native coronary artery without angina pectoris: Secondary | ICD-10-CM

## 2011-05-08 DIAGNOSIS — E7800 Pure hypercholesterolemia, unspecified: Secondary | ICD-10-CM | POA: Insufficient documentation

## 2011-05-08 DIAGNOSIS — I2789 Other specified pulmonary heart diseases: Secondary | ICD-10-CM | POA: Insufficient documentation

## 2011-05-08 DIAGNOSIS — F411 Generalized anxiety disorder: Secondary | ICD-10-CM | POA: Insufficient documentation

## 2011-05-08 HISTORY — DX: Essential (primary) hypertension: I10

## 2011-05-08 HISTORY — DX: Other specified postprocedural states: Z98.890

## 2011-05-08 HISTORY — DX: Presence of coronary angioplasty implant and graft: Z95.5

## 2011-05-08 HISTORY — DX: Other specified postprocedural states: R11.2

## 2011-05-08 HISTORY — DX: Personal history of other diseases of the digestive system: Z87.19

## 2011-05-08 HISTORY — PX: PERCUTANEOUS CORONARY STENT INTERVENTION (PCI-S): SHX5485

## 2011-05-08 LAB — BASIC METABOLIC PANEL
BUN: 18 mg/dL (ref 6–23)
CO2: 25 mEq/L (ref 19–32)
Chloride: 107 mEq/L (ref 96–112)
Creatinine, Ser: 1.11 mg/dL — ABNORMAL HIGH (ref 0.50–1.10)
GFR calc Af Amer: 55 mL/min — ABNORMAL LOW (ref >90)
Potassium: 3.7 mEq/L (ref 3.5–5.1)

## 2011-05-08 LAB — CBC
Hemoglobin: 11.4 g/dL — ABNORMAL LOW (ref 12.0–15.0)
MCH: 27.7 pg (ref 26.0–34.0)
MCV: 82.3 fL (ref 78.0–100.0)
Platelets: 135 10*3/uL — ABNORMAL LOW (ref 150–400)
RBC: 4.12 MIL/uL (ref 3.87–5.11)
WBC: 4.9 10*3/uL (ref 4.0–10.5)

## 2011-05-08 LAB — POCT ACTIVATED CLOTTING TIME: Activated Clotting Time: 430 seconds

## 2011-05-08 SURGERY — PERCUTANEOUS CORONARY STENT INTERVENTION (PCI-S)
Anesthesia: LOCAL

## 2011-05-08 MED ORDER — LIDOCAINE HCL (PF) 1 % IJ SOLN
INTRAMUSCULAR | Status: AC
  Filled 2011-05-08: qty 30

## 2011-05-08 MED ORDER — ZOLPIDEM TARTRATE 5 MG PO TABS
5.0000 mg | ORAL_TABLET | Freq: Every day | ORAL | Status: DC
  Administered 2011-05-08: 5 mg via ORAL
  Filled 2011-05-08: qty 1

## 2011-05-08 MED ORDER — NITROGLYCERIN 0.2 MG/ML ON CALL CATH LAB
INTRAVENOUS | Status: AC
  Filled 2011-05-08: qty 1

## 2011-05-08 MED ORDER — MELOXICAM 7.5 MG PO TABS
7.5000 mg | ORAL_TABLET | Freq: Every day | ORAL | Status: DC
  Administered 2011-05-09: 7.5 mg via ORAL
  Filled 2011-05-08 (×2): qty 1

## 2011-05-08 MED ORDER — CARVEDILOL 3.125 MG PO TABS
3.1250 mg | ORAL_TABLET | Freq: Two times a day (BID) | ORAL | Status: DC
  Administered 2011-05-08 – 2011-05-09 (×2): 3.125 mg via ORAL
  Filled 2011-05-08 (×3): qty 1

## 2011-05-08 MED ORDER — OMEGA-3-ACID ETHYL ESTERS 1 G PO CAPS
1.0000 g | ORAL_CAPSULE | Freq: Every day | ORAL | Status: DC
  Administered 2011-05-09: 1 g via ORAL
  Filled 2011-05-08: qty 1

## 2011-05-08 MED ORDER — TRAZODONE HCL 100 MG PO TABS
100.0000 mg | ORAL_TABLET | Freq: Every day | ORAL | Status: DC
  Administered 2011-05-08: 100 mg via ORAL
  Filled 2011-05-08 (×2): qty 1

## 2011-05-08 MED ORDER — DIAZEPAM 5 MG PO TABS
ORAL_TABLET | ORAL | Status: AC
  Administered 2011-05-08: 5 mg
  Filled 2011-05-08: qty 1

## 2011-05-08 MED ORDER — ISOSORBIDE MONONITRATE ER 30 MG PO TB24
30.0000 mg | ORAL_TABLET | Freq: Every day | ORAL | Status: DC
  Administered 2011-05-09: 30 mg via ORAL
  Filled 2011-05-08 (×2): qty 1

## 2011-05-08 MED ORDER — SODIUM CHLORIDE 0.9 % IV SOLN
INTRAVENOUS | Status: DC
  Administered 2011-05-08: 13:00:00 via INTRAVENOUS

## 2011-05-08 MED ORDER — ADULT MULTIVITAMIN W/MINERALS CH
1.0000 | ORAL_TABLET | Freq: Every day | ORAL | Status: DC
  Administered 2011-05-09: 1 via ORAL
  Filled 2011-05-08: qty 1

## 2011-05-08 MED ORDER — CITALOPRAM HYDROBROMIDE 40 MG PO TABS
40.0000 mg | ORAL_TABLET | Freq: Every day | ORAL | Status: DC
  Administered 2011-05-09: 40 mg via ORAL
  Filled 2011-05-08 (×2): qty 1

## 2011-05-08 MED ORDER — ASPIRIN 81 MG PO CHEW: 81.0000 mg | CHEWABLE_TABLET | Freq: Every day | ORAL | Status: DC

## 2011-05-08 MED ORDER — HEPARIN (PORCINE) IN NACL 2-0.9 UNIT/ML-% IJ SOLN
INTRAMUSCULAR | Status: AC
  Filled 2011-05-08: qty 2000

## 2011-05-08 MED ORDER — FUROSEMIDE 20 MG PO TABS
20.0000 mg | ORAL_TABLET | Freq: Every day | ORAL | Status: DC
  Administered 2011-05-09: 20 mg via ORAL
  Filled 2011-05-08 (×2): qty 1

## 2011-05-08 MED ORDER — FENTANYL CITRATE 0.05 MG/ML IJ SOLN
INTRAMUSCULAR | Status: AC
Start: 2011-05-08 — End: 2011-05-08
  Filled 2011-05-08: qty 2

## 2011-05-08 MED ORDER — ROSUVASTATIN CALCIUM 5 MG PO TABS
5.0000 mg | ORAL_TABLET | Freq: Every day | ORAL | Status: DC
  Filled 2011-05-08: qty 1

## 2011-05-08 MED ORDER — LOSARTAN POTASSIUM 50 MG PO TABS
100.0000 mg | ORAL_TABLET | Freq: Every day | ORAL | Status: DC
  Administered 2011-05-09: 100 mg via ORAL
  Filled 2011-05-08: qty 2

## 2011-05-08 MED ORDER — CLOPIDOGREL BISULFATE 75 MG PO TABS
150.0000 mg | ORAL_TABLET | ORAL | Status: AC
  Administered 2011-05-08: 75 mg via ORAL

## 2011-05-08 MED ORDER — AMLODIPINE BESYLATE 10 MG PO TABS
10.0000 mg | ORAL_TABLET | Freq: Every day | ORAL | Status: DC
  Administered 2011-05-09: 10 mg via ORAL
  Filled 2011-05-08 (×2): qty 1

## 2011-05-08 MED ORDER — LORATADINE 10 MG PO TABS
10.0000 mg | ORAL_TABLET | Freq: Every day | ORAL | Status: DC
  Administered 2011-05-09: 10 mg via ORAL
  Filled 2011-05-08: qty 1

## 2011-05-08 MED ORDER — NITROGLYCERIN 0.4 MG SL SUBL: 0.4000 mg | SUBLINGUAL_TABLET | SUBLINGUAL | Status: DC | PRN

## 2011-05-08 MED ORDER — ONDANSETRON HCL 4 MG/2ML IJ SOLN: 4.0000 mg | Freq: Four times a day (QID) | INTRAMUSCULAR | Status: DC | PRN

## 2011-05-08 MED ORDER — FERROUS SULFATE 325 (65 FE) MG PO TBEC: 325.0000 mg | DELAYED_RELEASE_TABLET | Freq: Every day | ORAL | Status: DC

## 2011-05-08 MED ORDER — ASPIRIN 81 MG PO CHEW
324.0000 mg | CHEWABLE_TABLET | ORAL | Status: AC
  Administered 2011-05-08: 324 mg via ORAL

## 2011-05-08 MED ORDER — ZOLPIDEM TARTRATE 5 MG PO TABS: 10.0000 mg | ORAL_TABLET | Freq: Every day | ORAL | Status: DC

## 2011-05-08 MED ORDER — POTASSIUM CHLORIDE CRYS ER 10 MEQ PO TBCR
10.0000 meq | EXTENDED_RELEASE_TABLET | Freq: Every day | ORAL | Status: DC
  Administered 2011-05-09: 10 meq via ORAL
  Filled 2011-05-08 (×2): qty 1

## 2011-05-08 MED ORDER — SODIUM CHLORIDE 0.9 % IV SOLN
INTRAVENOUS | Status: AC
  Administered 2011-05-08: 19:00:00 via INTRAVENOUS

## 2011-05-08 MED ORDER — SODIUM CHLORIDE 0.9 % IJ SOLN
3.0000 mL | INTRAMUSCULAR | Status: DC | PRN
  Administered 2011-05-08: 3 mL via INTRAVENOUS

## 2011-05-08 MED ORDER — PANTOPRAZOLE SODIUM 40 MG PO TBEC: 40.0000 mg | DELAYED_RELEASE_TABLET | Freq: Every day | ORAL | Status: DC

## 2011-05-08 MED ORDER — ACETAMINOPHEN 325 MG PO TABS: 650.0000 mg | ORAL_TABLET | ORAL | Status: DC | PRN

## 2011-05-08 MED ORDER — CLOPIDOGREL BISULFATE 75 MG PO TABS
ORAL_TABLET | ORAL | Status: AC
  Filled 2011-05-08: qty 2

## 2011-05-08 MED ORDER — CLOPIDOGREL BISULFATE 75 MG PO TABS
75.0000 mg | ORAL_TABLET | Freq: Every day | ORAL | Status: DC
  Administered 2011-05-09: 75 mg via ORAL
  Filled 2011-05-08: qty 1

## 2011-05-08 MED ORDER — NIACIN ER (ANTIHYPERLIPIDEMIC) 500 MG PO TBCR
500.0000 mg | EXTENDED_RELEASE_TABLET | Freq: Every day | ORAL | Status: DC
  Filled 2011-05-08 (×2): qty 1

## 2011-05-08 MED ORDER — BIVALIRUDIN 250 MG IV SOLR
INTRAVENOUS | Status: AC
  Filled 2011-05-08: qty 250

## 2011-05-08 MED ORDER — ADENOSINE 12 MG/4ML IV SOLN
12.0000 mL | INTRAVENOUS | Status: DC
  Filled 2011-05-08: qty 12

## 2011-05-08 MED ORDER — ASPIRIN 81 MG PO CHEW
CHEWABLE_TABLET | ORAL | Status: AC
  Filled 2011-05-08: qty 4

## 2011-05-08 MED ORDER — LOSARTAN POTASSIUM 50 MG PO TABS: 100.0000 mg | ORAL_TABLET | Freq: Every day | ORAL | Status: DC

## 2011-05-08 MED ORDER — FERROUS SULFATE 325 (65 FE) MG PO TABS
325.0000 mg | ORAL_TABLET | Freq: Every day | ORAL | Status: DC
  Administered 2011-05-09: 325 mg via ORAL
  Filled 2011-05-08 (×2): qty 1

## 2011-05-08 MED ORDER — CLOPIDOGREL BISULFATE 75 MG PO TABS
ORAL_TABLET | ORAL | Status: AC
  Filled 2011-05-08: qty 1

## 2011-05-08 MED ORDER — SODIUM CHLORIDE 0.9 % IV SOLN: 250.0000 mL | INTRAVENOUS | Status: DC | PRN

## 2011-05-08 MED ORDER — ALPRAZOLAM 0.25 MG PO TABS: 0.2500 mg | ORAL_TABLET | Freq: Every evening | ORAL | Status: DC | PRN

## 2011-05-08 MED ORDER — SODIUM CHLORIDE 0.9 % IJ SOLN: 3.0000 mL | Freq: Two times a day (BID) | INTRAMUSCULAR | Status: DC

## 2011-05-08 MED ORDER — MIDAZOLAM HCL 2 MG/2ML IJ SOLN
INTRAMUSCULAR | Status: AC
  Filled 2011-05-08: qty 2

## 2011-05-08 NOTE — Interval H&P Note (Signed)
History and Physical Interval Note:  05/08/2011 3:35 PM  [name]  has presented today for surgery, with the diagnosis of CAD  The various methods of treatment have been discussed with the patient and family. After consideration of risks, benefits and other options for treatment, the patient has consented to  Procedure(s): PERCUTANEOUS CORONARY STENT INTERVENTION (PCI-S) as a surgical intervention .  The patients' history has been reviewed, patient examined, no change in status, stable for surgery.  I have reviewed the patients' chart and labs.  Questions were answered to the patient's satisfaction.     Lorine Bears

## 2011-05-08 NOTE — Op Note (Signed)
CARDIAC CATH NOTE  Name: [name] MRN: [id number] DOB: [birth year]  Procedure: PTCA and stenting of the proximal left anterior descending artery with a drug-eluting stent. Pressure wire interrogation post-stent placement to evaluate the significance of the ostial LAD stenosis.  Indication: This is a 77 year old female who recently underwent cardiac catheterization for symptoms of exertional chest pain and dyspnea. Her cardiac catheterization showed a 60% ostial LAD stenosis and 70-80% proximal LAD discreet stenosis. The patient was brought back for treatment of the proximal LAD stenosis and pressure wire interrogation of the ostial lesion.  Medications:  Sedation:   1 mg IV Versed, 25 mcg IV Fentanyl  Contrast:  85 mL Omnipaque  Procedural Details: The right wrist was prepped, draped, and anesthetized with 1% lidocaine. Using the modified Seldinger technique, a 6 Fr sheath was introduced into the radial artery. 3 mg verapamil was administered through the radial sheath. Weight-based bivalirudin was given for anticoagulation. Once a therapeutic ACT was achieved, a 6 [place] XB 3.0 guide catheter was inserted.  A [place] pressure coronary guidewire was used to cross the lesion. There was some difficulty due to steep angulation of the left anterior descending artery.  The lesion in the proximal LAD was predilated with a 2.5 x 12 mm balloon.  The lesion was then stented with a 2.75 x 14 mm resolute integrity drug-eluting stent.  The stent was postdilated with a 2.75 noncompliant balloon to 16 atmospheres.  Following PCI, there was 0% residual stenosis and TIMI-3 flow. Final angiography confirmed an excellent result. The pressure wire was then connected back. Intravenous adenosine was started at a dose of 140 mcg per kilogram per minute. Pressure was recorded. The patient tolerated the procedure well. There were no immediate procedural complications. A TR band was used for radial hemostasis. The patient was  transferred to the post catheterization recovery area for further monitoring.  PCI Data: Vessel - left anterior descending artery/Segment - proximal (12) Percent Stenosis (pre)  80% TIMI-flow 3 Stent 2.75 x 14 mm resolute integrity stent Percent Stenosis (post) 0% TIMI-flow (post) 3  FFR post-PCI: The resting gradient ratio was 0.9. FFR was 0.83. The ostial lesion was thus not treated with PCI.  Final Conclusions:  1. Successful angioplasty and drug-eluting stent placement to the 80% lesion in proximal left anterior descending artery.  2. The ostial lesion in the LAD was estimated to be 60% with an FFR ratio of 0.83. Thus, medical therapy is recommended.  Recommendations:  I recommend dual antiplatelet therapy for at least one year. Continue aggressive treatment of risk factors. Consider cardiac rehabilitation.  Lorine Bears MD, [facility] 05/08/2011, 3:26 PM

## 2011-05-08 NOTE — H&P (View-Only) (Signed)
 Guy De Gent, MD, FACC ABIM Board Certified in Adult Cardiovascular Medicine,Internal Medicine and Critical Care Medicine    CC: followup patient has a recent catheterization with significant coronary artery disease  HPI:  The patient is a 77-year-old female referred for cardiac catheterization because of dyspnea and exertion and chest tightness.  She has a history of use of anorexic agents and has valvular heart disease and a right heart catheterization was requested to rule out pulmonary hypertension.  She does have moderate tricuspid regurgitation.  Despite echocardiographic findings of significant pulmonary hypertension, right heart catheterization demonstrated normal PA pressures.  However, the patient did have significant coronary artery disease with sequential lesions in the LAD, in particularly the very high-grade mid LAD lesion sequentially after a more moderate ostial LAD lesion.  There was also significant disease in the distal RCA but less likely to be a cause of ischemia.  There appeared to be a small outflow gradient but no Brockenbrough maneuver was performed.  There was no prior evidence of outflow gradient by echocardiogram.  The patient also does not have the physical examination consistent with outflow obstruction. She reports post cardiac catheterization that showed some swelling and pain in the right groin.  The patient was examined today to rule out pseudoaneurysm or AV fistula. She reports that she continues to have shortness of breath on exertion and some mild chest pain also on exertion, but otherwise asymptomatic at rest. I reviewed the diagnostic cardiac catheterization images with Dr. Arida, we then went to see the patient and talked with her.  After brief discussion and after extensively reviewing the angiographic images we felt that the patient should be referred for intervention to the LAD.  PMH: reviewed and listed in Problem List in Electronic Records (and see  below) Past Medical History  Diagnosis Date  . Depressive disorder, not elsewhere classified   . Anxiety state, unspecified   . Pure hypercholesterolemia   . Esophageal reflux   . Infection of esophagostomy   . Congestive heart failure, unspecified     diastolic heart failure  . Pulmonary hypertension     PA systolic pressure 55-65 mmHg by echocardiogram, PA pressure 33/10 by cardiac catheterization PA saturation 62% thermodilution cardiac index 2.0 thick cardiac index 2.4  . Symptomatic bradycardia     on beta blocker  . Tricuspid regurgitation     moderate tricuspid regurgitation by echocardiogram, prior use of anorexic agents  . Melanoma 1995    removal on back   . Coronary artery disease     Moderate to severe coronary disease involving the left anterior descending artery and right coronary artery.  Sequential stenosis in the LAD is significant.  Right coronary artery is moderate to severely likely nonischemic.,  Questionable small LVOT obstruction.  No Brockenbrough maneuver was performed.  Catheterization January 2013   Past Surgical History  Procedure Date  . Hiatal hernia repair   . Rotator cuff repair     Allergies/SH/FHX : available in Electronic Records for review  Allergies  Allergen Reactions  . Sulfamethoxazole     REACTION: rash   History   Social History  . Marital Status: Divorced    Spouse Name: N/A    Number of Children: N/A  . Years of Education: N/A   Occupational History  . Not on file.   Social History Main Topics  . Smoking status: Former Smoker -- 1.0 packs/day for 20 years    Types: Cigarettes    Quit date: 04/01/1988  .   Smokeless tobacco: Never Used  . Alcohol Use: No  . Drug Use: No  . Sexually Active: Not on file   Other Topics Concern  . Not on file   Social History Narrative  . No narrative on file   Family History  Problem Relation Age of Onset  . Heart attack Father     Medications: Current Outpatient Prescriptions   Medication Sig Dispense Refill  . ALPRAZolam (XANAX) 0.25 MG tablet Take 0.25 mg by mouth at bedtime as needed.        . amLODipine (NORVASC) 10 MG tablet Take 10 mg by mouth daily.        . aspirin 325 MG tablet Take 325 mg by mouth daily.        . citalopram (CELEXA) 40 MG tablet Take 40 mg by mouth daily.        . esomeprazole (NEXIUM) 40 MG capsule Take 40 mg by mouth daily before breakfast.        . ferrous sulfate 325 (65 FE) MG EC tablet Take 325 mg by mouth daily with breakfast.        . fexofenadine (ALLEGRA) 180 MG tablet Take 180 mg by mouth daily.        . furosemide (LASIX) 20 MG tablet Take 20 mg by mouth daily.        . ibandronate (BONIVA) 150 MG tablet Take 150 mg by mouth every 30 (thirty) days. Take in the morning with a full glass of water, on an empty stomach, and do not take anything else by mouth or lie down for the next 30 min.       . losartan (COZAAR) 100 MG tablet Take 100 mg by mouth daily.        . LUTEIN PO Take 1 tablet by mouth daily.        . meloxicam (MOBIC) 7.5 MG tablet Take 7.5 mg by mouth daily.        . niacin (NIASPAN) 500 MG CR tablet Take 500 mg by mouth at bedtime.        . omega-3 acid ethyl esters (LOVAZA) 1 G capsule Take 1 g by mouth daily.        . potassium chloride (KLOR-CON) 10 MEQ CR tablet Take 10 mEq by mouth daily.        . rosuvastatin (CRESTOR) 5 MG tablet Take 5 mg by mouth daily.        . traZODone (DESYREL) 100 MG tablet Take 100 mg by mouth at bedtime.        . zolpidem (AMBIEN) 10 MG tablet Take 10 mg by mouth at bedtime.        . carvedilol (COREG) 3.125 MG tablet Take 1 tablet (3.125 mg total) by mouth 2 (two) times daily with a meal.  60 tablet  6  . clopidogrel (PLAVIX) 75 MG tablet Take 1 tablet (75 mg total) by mouth daily.  30 tablet  6  . isosorbide mononitrate (IMDUR) 30 MG 24 hr tablet Take 1 tablet (30 mg total) by mouth daily.  30 tablet  6  . Multiple Vitamin (MULITIVITAMIN WITH MINERALS) TABS Take 1 tablet by mouth  daily. Centrum silver (equate)      . nitroGLYCERIN (NITROSTAT) 0.4 MG SL tablet Place 1 tablet (0.4 mg total) under the tongue every 5 (five) minutes as needed for chest pain.  25 tablet  3    ROS: No nausea or vomiting. No fever or chills.No melena or hematochezia.No   bleeding.No claudication  Physical Exam: BP 125/75  Pulse 60  Ht 5' 4" (1.626 m)  Wt 173 lb (78.472 kg)  BMI 29.70 kg/m2 The patient was examined in the lower extremities to rule out pseudoaneurysm.  The patient was placed in a gown.  The nurse assisted me in the exam. The patient had swelling and somewhat of a pulsatile mass size of a golf ball in the right groin.  There was a systolic bruit present, over the right femoral artery.  No bruit was heard over the left femoral artery.  12lead ECG:not performed Limited bedside ECHO:N/A   Patient Active Problem List  Diagnoses   . HYPERCHOLESTEROLEMIA   . ANXIETY   . DEPRESSION, CHRONIC   . CHF   . DIASTOLIC DYSFUNCTION-normal LV filling pressures   . GERD   . BARRETTS ESOPHAGUS   . CONSTIPATION   . CHEST DISCOMFORT   . DOE (dyspnea on exertion)-likely angina equivalent   . Palpitations   . HTN (hypertension)   . Sinus bradycardia   . Pulmonary hypertension-moderate by echocardiogram and no significant pulmonary hypertension by cardiac catheterization   . Coronary artery disease-significant CAD as outlined above   . Tricuspid regurgitation-moderate   .   Symptomatic bradycardia Right groin pain-rule out pseudoaneurysm    PLAN   Diagnostic images were reviewed and given the patient's ongoing symptoms the patient will be scheduled for an intervention to the LAD as outlined above.  Risks and benefits of the procedure were discussed with the patient and she is willing to proceed.  In the interim the patient will be placed on Coreg, isosorbide mononitrate and when necessary nitroglycerin.  The patient has an abnormal bruit in the right groin  and she will be referred for a arterial Doppler to rule out pseudoaneurysm.  Patient will be referred for coronary intervention next week.    Addendum: Patient already had arterial Doppler studies done and there was no presence of a pseudoaneurysm in the right groin.       

## 2011-05-09 ENCOUNTER — Telehealth: Payer: Self-pay | Admitting: *Deleted

## 2011-05-09 ENCOUNTER — Other Ambulatory Visit: Payer: Self-pay

## 2011-05-09 DIAGNOSIS — I2510 Atherosclerotic heart disease of native coronary artery without angina pectoris: Secondary | ICD-10-CM

## 2011-05-09 LAB — BASIC METABOLIC PANEL
BUN: 17 mg/dL (ref 6–23)
Chloride: 106 mEq/L (ref 96–112)
Creatinine, Ser: 1.14 mg/dL — ABNORMAL HIGH (ref 0.50–1.10)
GFR calc Af Amer: 54 mL/min — ABNORMAL LOW (ref >90)

## 2011-05-09 LAB — CBC
HCT: 32.9 % — ABNORMAL LOW (ref 36.0–46.0)
MCV: 82.9 fL (ref 78.0–100.0)
RDW: 13.7 % (ref 11.5–15.5)
WBC: 5.1 10*3/uL (ref 4.0–10.5)

## 2011-05-09 MED ORDER — ASPIRIN 81 MG PO CHEW: 81.0000 mg | CHEWABLE_TABLET | Freq: Every day | ORAL | Status: DC

## 2011-05-09 MED ORDER — PANTOPRAZOLE SODIUM 40 MG PO TBEC: 40.0000 mg | DELAYED_RELEASE_TABLET | Freq: Every day | ORAL | Status: DC

## 2011-05-09 MED ORDER — ASPIRIN 81 MG PO CHEW: 81.0000 mg | CHEWABLE_TABLET | Freq: Every day | ORAL | Status: AC

## 2011-05-09 NOTE — Telephone Encounter (Signed)
Received call from pharmacist saying that patient was under the assumption that another medication besides protonix was going to be sent to them. Please advise.

## 2011-05-09 NOTE — Discharge Summary (Signed)
Discharge Summary   Patient ID: [name] MRN: [id number], DOB/AGE: 75-Jul-1936 75 y.o.  Primary MD: Providence Lanius, PA Primary Cardiologist: Lewayne Bunting MD  Admit date: 05/08/2011 D/C date:     05/09/2011      Primary Discharge Diagnoses:  1. Coronary Artery Disease  - Diagnostic cath on 04/12/11 revealed 60% ostial LAD stenosis and 70-80% proximal LAD discreet stenosis, EF 65-70%  - Returned for intervention on 05/08/11, s/p PTCA/DES to prox LAD  Secondary Discharge Diagnoses:  1. Diastolic CHF 2. Sinus Bradycardia, symptomatic 3. Pulmonary HTN - moderate by echocardiogram and no significant pulmonary hypertension by cardiac catheterization  4. Tricuspid Regurgitation, moderate 5. HLD 6. HTN 7. Melanoma s/p excision '95 8. Anxiety 9. Depression 10. GERD 11. [name]'s Esophagus  12. Constipation  Allergies Allergies  Allergen Reactions  . Sulfamethoxazole     REACTION: rash    Diagnostic Studies/Procedures:   05/08/11 - PTCA and stenting of the proximal left anterior descending artery with a drug-eluting stent PCI Data:  Vessel - left anterior descending artery/Segment - proximal (12)  Percent Stenosis (pre) 80%  TIMI-flow 3  Stent 2.75 x 14 mm resolute integrity stent  Percent Stenosis (post) 0%  TIMI-flow (post) 3  FFR post-PCI: The resting gradient ratio was 0.9. FFR was 0.83. The ostial lesion was thus not treated with PCI.  Final Conclusions:  1. Successful angioplasty and 2.75 x 14 mm resolute integrity drug-eluting stent placement to the 80% lesion in proximal left anterior descending artery.  2. The ostial lesion in the LAD was estimated to be 60% with an FFR ratio of 0.83. Thus, medical therapy is recommended.  Recommendations:  I recommend dual antiplatelet therapy for at least one year. Continue aggressive treatment of risk factors. Consider cardiac rehabilitation.   History of Present Illness: 77 y.o. female w/ PMHx significant for Diastolic CHF,  HLD, HTN, and recently diagnosed CAD who presented to [facility] on 05/08/11 for planned PCI.  She underwent diagnostic cardiac catheterization on 04/12/11 for symptoms of exertional chest pain and dyspnea. Her cardiac catheterization showed a 60% ostial LAD stenosis and 70-80% proximal LAD discreet stenosis. There was also significant disease in the distal RCA but less likely to be a cause of ischemia. During a follow up visit with Dr. Andee Lineman, planned intervention of the LAD was scheduled after discussions with the patient.  Hospital Course: Cardiac catheterization was performed on 05/08/11 with successful angioplasty and DES placement to the 80% lesion in the proximal LAD. The ostial lesion in the LAD was not intervened upon and recommendations were made for medical therapy. She tolerated the procedure well without complications. She will be on dual antiplatelet therapy with Plavix and ASA for at least one year with continued aggressive treatment of risk factors. Due to her history of bleeding she will need close monitoring while on DAPT. Her Nexium will be changed to Protonix due to Plavix metabolism.  She was able to ambulate with cardiac rehab without complaints of chest pain or shortness of breath. She was seen and evaluated by Dr. Riley Kill who felt she was stable for discharge home with plans for follow up as scheduled below.  Discharge Vitals: Blood pressure 114/71, pulse 65, temperature 98.4 F (36.9 C), temperature source Oral, resp. rate 18, height 5\' 4"  (1.626 m), weight 176 lb 5.9 oz (80 kg), SpO2 100.00%.  Labs: Component Value Date   WBC 5.1 05/09/2011   HGB 10.8* 05/09/2011   HCT 32.9* 05/09/2011  MCV 82.9 05/09/2011   PLT 126* 05/09/2011    Lab 05/09/11 0506  NA 140  K 3.6  CL 106  CO2 27  BUN 17  CREATININE 1.14*  CALCIUM 9.4  GLUCOSE 109*    Discharge Medications   Medication List  As of 05/09/2011  1:37 PM   STOP taking these medications         aspirin 325 MG tablet       esomeprazole 40 MG capsule         TAKE these medications         ALPRAZolam 0.25 MG tablet   Commonly known as: XANAX   Take 0.25 mg by mouth at bedtime as needed.      amLODipine 10 MG tablet   Commonly known as: NORVASC   Take 10 mg by mouth daily.      aspirin 81 MG chewable tablet   Chew 1 tablet (81 mg total) by mouth daily.      carvedilol 3.125 MG tablet   Commonly known as: COREG   Take 1 tablet (3.125 mg total) by mouth 2 (two) times daily with a meal.      citalopram 40 MG tablet   Commonly known as: CELEXA   Take 40 mg by mouth daily.      clopidogrel 75 MG tablet   Commonly known as: PLAVIX   Take 1 tablet (75 mg total) by mouth daily.      ferrous sulfate 325 (65 FE) MG EC tablet   Take 325 mg by mouth daily with breakfast.      fexofenadine 180 MG tablet   Commonly known as: ALLEGRA   Take 180 mg by mouth daily.      furosemide 20 MG tablet   Commonly known as: LASIX   Take 20 mg by mouth daily.      ibandronate 150 MG tablet   Commonly known as: BONIVA   Take 150 mg by mouth every 30 (thirty) days. Take in the morning with a full glass of water, on an empty stomach, and do not take anything else by mouth or lie down for the next 30 min.      isosorbide mononitrate 30 MG 24 hr tablet   Commonly known as: IMDUR   Take 1 tablet (30 mg total) by mouth daily.      losartan 100 MG tablet   Commonly known as: COZAAR   Take 100 mg by mouth daily.      LOVAZA 1 G capsule   Generic drug: omega-3 acid ethyl esters   Take 1 g by mouth daily.      LUTEIN PO   Take 1 tablet by mouth daily.      meloxicam 7.5 MG tablet   Commonly known as: MOBIC   Take 7.5 mg by mouth daily.      mulitivitamin with minerals Tabs   Take 1 tablet by mouth daily. Centrum silver (equate)      niacin 500 MG CR tablet   Commonly known as: NIASPAN   Take 500 mg by mouth at bedtime.      nitroGLYCERIN 0.4 MG SL tablet   Commonly known as: NITROSTAT   Place 1 tablet  (0.4 mg total) under the tongue every 5 (five) minutes as needed for chest pain.      pantoprazole 40 MG tablet   Commonly known as: PROTONIX   Take 1 tablet (40 mg total) by mouth daily.  potassium chloride 10 MEQ CR tablet   Commonly known as: KLOR-CON   Take 10 mEq by mouth daily.      rosuvastatin 5 MG tablet   Commonly known as: CRESTOR   Take 5 mg by mouth daily.      traZODone 100 MG tablet   Commonly known as: DESYREL   Take 100 mg by mouth at bedtime.      zolpidem 10 MG tablet   Commonly known as: AMBIEN   Take 10 mg by mouth at bedtime.            Disposition   Discharge Orders    Future Appointments: Provider: Department: Dept Phone: Center:   05/31/2011 10:45 AM Peyton Bottoms, MD [facility] [phone] [id number]     Future Orders Please Complete By Expires   Diet - low sodium heart healthy      Increase activity slowly      Call MD for:  redness, tenderness, or signs of infection (pain, swelling, redness, odor or green/yellow discharge around incision site)      Discharge instructions      Comments:   **PLEASE REMEMBER TO BRING ALL OF YOUR MEDICATIONS TO EACH OF YOUR FOLLOW-UP OFFICE VISITS.  *KEEP WRIST CATHETERIZATION SITE CLEAN AND DRY. Call the office for any signs of bleeding, pus, swelling, increased pain, or any other concerns. *NO HEAVY LIFTING (>10lbs) OR SEXUAL ACTIVITY X 7 DAYS. * NO DRIVING X 3-5 DAYS. * NO SOAKING BATHS, HOT TUBS, POOLS, ETC., X 7 DAYS.      Follow-up Information    Follow up with Peyton Bottoms, MD on 05/31/2011. (10:45)    Contact information:   [facility] [address] [place] [place] [address] [phone]           Outstanding Labs/Studies: None  Duration of Discharge Encounter: Greater than 30 minutes including physician and PA time.  Signed, Jazariah Teall PA-C 05/09/2011, 1:37 PM

## 2011-05-09 NOTE — Progress Notes (Signed)
CARDIAC REHAB PHASE I   PRE:  Rate/Rhythm: 58 SB    BP: sitting 134/52    SaO2: 100 RA  MODE:  Ambulation: 340 ft   POST:  Rate/Rhythm: 92     BP: sitting 114/71     SaO2: 98 RA  Tolerated well. Feels much better. Anxious to get active again. Ed completed and requests her name be sent to [facility]. [id number]  Harriet Masson CES, ACSM

## 2011-05-09 NOTE — Progress Notes (Signed)
Subjective:  Doing well and wants to go home.  No chest pain.  No wrist discomfort.  Did well without difficulty.    Objective:  Vital Signs in the last 24 hours: Temp:  [97.5 F (36.4 C)-98.6 F (37 C)] 98.4 F (36.9 C) (02/07 0809) Pulse Rate:  [58-77] 65  (02/07 0809) Resp:  [16-20] 18  (02/07 0809) BP: (111-141)/(50-94) 114/71 mmHg (02/07 0809) SpO2:  [95 %-100 %] 100 % (02/07 0809) Weight:  [160 lb (72.576 kg)-176 lb 5.9 oz (80 kg)] 176 lb 5.9 oz (80 kg) (02/07 0730)  Intake/Output from previous day: 02/06 0701 - 02/07 0700 In: 1180 [P.O.:680; I.V.:500] Out: -    Physical Exam: General: Well developed, well nourished, in no acute distress. Head:  Normocephalic and atraumatic. Lungs: Clear to auscultation and percussion. Heart: Normal S1 and S2.  No murmur, rubs or gallops.  Wrist site looks good, and no hematoma.  Warm hand.  Neurologic: Alert and oriented x 3.    Lab Results:  Basename 05/09/11 0506 05/08/11 1255  WBC 5.1 4.9  HGB 10.8* 11.4*  PLT 126* 135*    Basename 05/09/11 0506 05/08/11 1255  NA 140 142  K 3.6 3.7  CL 106 107  CO2 27 25  GLUCOSE 109* 108*  BUN 17 18  CREATININE 1.14* 1.11*   No results found for this basename: TROPONINI:2,CK,MB:2 in the last 72 hours Hepatic Function Panel No results found for this basename: PROT,ALBUMIN,AST,ALT,ALKPHOS,BILITOT,BILIDIR,IBILI in the last 72 hours No results found for this basename: CHOL in the last 72 hours No results found for this basename: PROTIME in the last 72 hours  Imaging: No results found.  EKG:  No acute changes.  Delay in R wave progression.     Assessment/Plan:  1.  CAD  --  Successful PCI. 2. GERD  --- has HH with history of bleeding so she takes iron.  Dr. Andee Lineman notified.  Will change Nexium to Protonix because of clopidogrel metabolism.   Also duration of DAPT discussed with Dr. Andee Lineman.  She will need to be monitored closely with a history of bleeding.    I made her aware of  all of these issues and she understands.  Dr. Andee Lineman also aware.  Follow up in two weeks.    DC time greater than 30 min     Shawnie Pons, MD, [facility], [facility] 05/09/2011, 10:30 AM

## 2011-05-10 MED FILL — Dextrose Inj 5%: INTRAVENOUS | Qty: 50 | Status: AC

## 2011-05-10 NOTE — Telephone Encounter (Signed)
Only Protonix is new. The other new one is Plavix but we already started her on that before the angioplasty.

## 2011-05-10 NOTE — Telephone Encounter (Signed)
Called and informed Arlys John at [employer].

## 2011-05-31 ENCOUNTER — Ambulatory Visit (INDEPENDENT_AMBULATORY_CARE_PROVIDER_SITE_OTHER): Payer: Medicare Other | Admitting: Cardiology

## 2011-05-31 ENCOUNTER — Encounter: Payer: Self-pay | Admitting: Cardiology

## 2011-05-31 VITALS — BP 102/62 | HR 63 | Ht 64.0 in | Wt 172.0 lb

## 2011-05-31 DIAGNOSIS — Z9889 Other specified postprocedural states: Secondary | ICD-10-CM

## 2011-05-31 DIAGNOSIS — Z9861 Coronary angioplasty status: Secondary | ICD-10-CM

## 2011-05-31 DIAGNOSIS — I2510 Atherosclerotic heart disease of native coronary artery without angina pectoris: Secondary | ICD-10-CM

## 2011-05-31 NOTE — Patient Instructions (Signed)
Your physician wants you to follow-up in: 6 months. You will receive a reminder letter in the mail one-two months in advance. If you don't receive a letter, please call our office to schedule the follow-up appointment. Your physician recommends that you continue on your current medications as directed. Please refer to the Current Medication list given to you today. 

## 2011-06-02 ENCOUNTER — Encounter: Payer: Self-pay | Admitting: Cardiology

## 2011-06-02 DIAGNOSIS — Z9861 Coronary angioplasty status: Secondary | ICD-10-CM | POA: Insufficient documentation

## 2011-06-02 NOTE — Progress Notes (Signed)
Heather Bottoms, MD, [facility] ABIM Board Certified in Adult Cardiovascular Medicine,Internal Medicine and Critical Care Medicine    CC: followup after recent catheterization for percutaneous intervention to the proximal LAD lesion  HPI:  The patient is a 77 year old female with history of coronary artery disease underwent recent stent placement to an 80% lesion in the proximal LAD.  The ostial lesion was treated medically because of an FFR ratio of 0.83.  She has experienced no recurrent chest pain.  However she's not struggling with an acute bronchitis.  She has been placed on antibiotics just recently.  She still has some phlegm which is yellow color.  She has no fever or chills.  She feels tired and fatigued from her current illness.  From a cardiac past for standpoint however she's stable she denies any chest pain, orthopnea or PND.  She reports no bleeding complications after recent catheterization.  PMH: reviewed and listed in Problem List in Electronic Records (and see below) Past Medical History  Diagnosis Date  . Depressive disorder, not elsewhere classified   . Anxiety state, unspecified   . Pure hypercholesterolemia   . Esophageal reflux   . Infection of esophagostomy   . Congestive heart failure, unspecified     diastolic heart failure  . Pulmonary hypertension     PA systolic pressure 55-65 mmHg by echocardiogram, PA pressure 33/10 by cardiac catheterization PA saturation 62% thermodilution cardiac index 2.0 thick cardiac index 2.4  . Symptomatic bradycardia     on beta blocker  . Tricuspid regurgitation     moderate tricuspid regurgitation by echocardiogram, prior use of anorexic agents  . Melanoma 1995    removal on back   . Coronary artery disease     Moderate to severe coronary disease involving the left anterior descending artery and right coronary artery.  Sequential stenosis in the LAD is significant.  Right coronary artery is moderate to severely likely nonischemic.,   Questionable small LVOT obstruction.  No Brockenbrough maneuver was performed.  Catheterization January 2013  . PONV (postoperative nausea and vomiting)   . Hypertension   . Dysrhythmia   . H/O hiatal hernia   . History of percutaneous coronary intervention     Successful angioplasty and drug-eluting stent placed to an 80% lesion in the proximal LAD.,  Ostial lesion in the LAD was estimated to be 60% with an FFR ratio of 0.83, medical therapy was recommended for the latter lesion.  Dual antiplatelet therapy for one year.  Procedure performed May 08, 2011   Past Surgical History  Procedure Date  . Hiatal hernia repair 1993  . Rotator cuff repair ~ 2001    right  . Coronary angioplasty with stent placement 05/08/11    "1"  . Tonsillectomy ~ 1944  . Shoulder arthroscopy ~ 2004; 2005    left; "joint's wore out"  . Cataract extraction w/ intraocular lens  implant, bilateral ~ 2002  . Tubal ligation 1972    Allergies/SH/FHX : available in Electronic Records for review  Allergies  Allergen Reactions  . Sulfamethoxazole     REACTION: rash   History   Social History  . Marital Status: Divorced    Spouse Name: N/A    Number of Children: N/A  . Years of Education: N/A   Occupational History  . Not on file.   Social History Main Topics  . Smoking status: Former Smoker -- 1.0 packs/day for 20 years    Types: Cigarettes    Quit date: 04/26/1988  .  Smokeless tobacco: Never Used  . Alcohol Use: No  . Drug Use: No  . Sexually Active: No   Other Topics Concern  . Not on file   Social History Narrative  . No narrative on file   Family History  Problem Relation Age of Onset  . Heart attack Father     Medications: Current Outpatient Prescriptions  Medication Sig Dispense Refill  . albuterol (PROAIR HFA) 108 (90 BASE) MCG/ACT inhaler Inhale 2 puffs into the lungs every 6 (six) hours as needed.      . ALPRAZolam (XANAX) 0.25 MG tablet Take 1/2 to 1 by mouth three times daily       . amLODipine (NORVASC) 10 MG tablet Take 10 mg by mouth daily.        Marland Kitchen aspirin 81 MG chewable tablet Chew 1 tablet (81 mg total) by mouth daily.      . carvedilol (COREG) 3.125 MG tablet Take 1 tablet (3.125 mg total) by mouth 2 (two) times daily with a meal.  60 tablet  6  . citalopram (CELEXA) 40 MG tablet Take 40 mg by mouth daily.        . clarithromycin (BIAXIN) 500 MG tablet Take 1,000 mg by mouth daily.      . clopidogrel (PLAVIX) 75 MG tablet Take 1 tablet (75 mg total) by mouth daily.  30 tablet  6  . ferrous sulfate 325 (65 FE) MG EC tablet Take 325 mg by mouth daily with breakfast.        . fexofenadine (ALLEGRA) 180 MG tablet Take 180 mg by mouth daily.        . furosemide (LASIX) 40 MG tablet Take 40 mg by mouth daily.      Marland Kitchen ibandronate (BONIVA) 150 MG tablet Take 150 mg by mouth every 30 (thirty) days. Take in the morning with a full glass of water, on an empty stomach, and do not take anything else by mouth or lie down for the next 30 min.       . isosorbide mononitrate (IMDUR) 30 MG 24 hr tablet Take 1 tablet (30 mg total) by mouth daily.  30 tablet  6  . losartan (COZAAR) 100 MG tablet Take 100 mg by mouth daily.        . LUTEIN PO Take 1 tablet by mouth daily.        . meloxicam (MOBIC) 7.5 MG tablet Take 7.5 mg by mouth daily.        . Multiple Vitamin (MULITIVITAMIN WITH MINERALS) TABS Take 1 tablet by mouth daily. Centrum silver (equate)       . niacin (NIASPAN) 500 MG CR tablet Take 500 mg by mouth at bedtime.        . nitroGLYCERIN (NITROSTAT) 0.4 MG SL tablet Place 1 tablet (0.4 mg total) under the tongue every 5 (five) minutes as needed for chest pain.  25 tablet  3  . omega-3 acid ethyl esters (LOVAZA) 1 G capsule Take 1 g by mouth daily.        . pantoprazole (PROTONIX) 40 MG tablet Take 1 tablet (40 mg total) by mouth daily.  30 tablet  6  . potassium chloride (KLOR-CON) 10 MEQ CR tablet Take 10 mEq by mouth daily.        . rosuvastatin (CRESTOR) 10 MG tablet  Take 10 mg by mouth daily.      . traZODone (DESYREL) 100 MG tablet Take 100 mg by mouth at bedtime.        Marland Kitchen  zolpidem (AMBIEN) 10 MG tablet Take 10 mg by mouth at bedtime.          ROS: No nausea or vomiting. No fever or chills.No melena or hematochezia.No bleeding.No claudication  Physical Exam: BP 102/62  Pulse 63  Ht 5\' 4"  (1.626 m)  Wt 172 lb (78.019 kg)  BMI 29.52 kg/m2  SpO2 97% General:well-nourished white female in no obvious distress Neck:upstroke no carotid bruit Lungs:lungs clear breath sounds bilaterally no wheezing Cardiac:regular rate and rhythm with normal S1-S2 no murmurs rubs or gallops Vascular:no edema.  Well-healed radial side from cardiac catheterization.  No bruising Skin:warm and dry Physcologic:normal affect  12lead ECG:not obtained Limited bedside ECHO:N/A No images are attached to the encounter.    Patient Active Problem List  Diagnoses  . HYPERCHOLESTEROLEMIA  . ANXIETY  . DEPRESSION, CHRONIC  . DIASTOLIC DYSFUNCTION  . GERD  . BARRETTS ESOPHAGUS  . CONSTIPATION  . DOE (dyspnea on exertion)-current bronchitis  . Palpitations  . HTN (hypertension)  . Sinus bradycardia  . Pulmonary hypertension-no pulmonary hypertension by recent catheterization-history of anorexic medication use  . Coronary artery disease-status post drug-eluting stent to the proximal LAD in February 2013  . Tricuspid regurgitation-moderate asymptomatic  . Symptomatic bradycardia-resolved  . History of percutaneous coronary intervention-stent to 80% lesion proximal LAD ostial lesion in the LAD treated medically with an FFR of 0.83 February 2013.    PLAN   The patient is doing well from a cardiovascular perspective.  She will continue for one year on dual antiplatelet therapy with aspirin and Plavix.  She reports no recurrent chest pain.  Catheter site shows no complications.  The patient is currently treated for acute bronchitis with antibiotics.  She will be  followed in 6 months.  She needs to adhere to therapeutic lifestyle changes and lipid panel can be monitored by her primary care physician.

## 2011-08-06 ENCOUNTER — Telehealth: Payer: Self-pay | Admitting: *Deleted

## 2011-08-06 NOTE — Telephone Encounter (Signed)
Patient needs routine dental cleaning and questions if she needs pre-medication in light of recent stent placement in February.  [name] notified that patient does not need any pre-meds as she does not have valve replacement or history of endocarditis.

## 2011-08-11 NOTE — Telephone Encounter (Signed)
That's is correct no endocarditis premedication is required.

## 2011-08-12 NOTE — Telephone Encounter (Signed)
Noted  

## 2011-10-28 ENCOUNTER — Other Ambulatory Visit: Payer: Self-pay | Admitting: *Deleted

## 2011-10-28 MED ORDER — CLOPIDOGREL BISULFATE 75 MG PO TABS: 75.0000 mg | ORAL_TABLET | Freq: Every day | ORAL | Status: DC

## 2011-10-28 MED ORDER — ISOSORBIDE MONONITRATE ER 30 MG PO TB24: 30.0000 mg | ORAL_TABLET | Freq: Every day | ORAL | Status: DC

## 2011-10-28 MED ORDER — CARVEDILOL 3.125 MG PO TABS: 3.1250 mg | ORAL_TABLET | Freq: Two times a day (BID) | ORAL | Status: DC

## 2011-11-28 ENCOUNTER — Ambulatory Visit (INDEPENDENT_AMBULATORY_CARE_PROVIDER_SITE_OTHER): Payer: Medicare Other | Admitting: Cardiology

## 2011-11-28 ENCOUNTER — Encounter: Payer: Self-pay | Admitting: Cardiology

## 2011-11-28 VITALS — BP 126/67 | HR 55 | Ht 64.0 in | Wt 151.0 lb

## 2011-11-28 DIAGNOSIS — Z9889 Other specified postprocedural states: Secondary | ICD-10-CM

## 2011-11-28 DIAGNOSIS — I498 Other specified cardiac arrhythmias: Secondary | ICD-10-CM

## 2011-11-28 DIAGNOSIS — R609 Edema, unspecified: Secondary | ICD-10-CM | POA: Insufficient documentation

## 2011-11-28 DIAGNOSIS — R001 Bradycardia, unspecified: Secondary | ICD-10-CM

## 2011-11-28 DIAGNOSIS — I2510 Atherosclerotic heart disease of native coronary artery without angina pectoris: Secondary | ICD-10-CM

## 2011-11-28 DIAGNOSIS — Z9861 Coronary angioplasty status: Secondary | ICD-10-CM

## 2011-11-28 NOTE — Assessment & Plan Note (Signed)
No recurrent chest pain. Stable 

## 2011-11-28 NOTE — Assessment & Plan Note (Signed)
Likely related to amlodipine will discontinue.

## 2011-11-28 NOTE — Assessment & Plan Note (Signed)
Currently no indication for ischemia testing but with ostial lesion future stress testing is indicated.

## 2011-11-28 NOTE — Assessment & Plan Note (Signed)
Heart rate stable at 55 beats per minute period

## 2011-11-28 NOTE — Patient Instructions (Signed)
   Stop Amlodipine Continue all other current medications. Follow up as needed

## 2011-11-28 NOTE — Progress Notes (Signed)
Heather Bottoms, MD, [facility] ABIM Board Certified in Adult Cardiovascular Medicine,Internal Medicine and Critical Care Medicine    CC:    Followup patient coronary artery disease                                                                              HPI:        The patient is a 77 year old female with a history of coronary disease status post stent to the LAD and ostial lesion that was felt not to be flow-limiting despite the fact he was 60%. She denies any chest pain orthopnea PND chest no palpitations or syncope. She is otherwise stable from a cardiovascular perspective. The patient exercises several times a week and does yoga as well as Silver sneakers. She is able to do this without any limitations in her activities. She reports no presyncope or syncope.  PMH: reviewed and listed in Problem List in Electronic Records (and see below) Past Medical History  Diagnosis Date  . Depressive disorder, not elsewhere classified   . Anxiety state, unspecified   . Pure hypercholesterolemia   . Esophageal reflux   . Infection of esophagostomy   . Congestive heart failure, unspecified     diastolic heart failure  . Pulmonary hypertension     PA systolic pressure 55-65 mmHg by echocardiogram, PA pressure 33/10 by cardiac catheterization PA saturation 62% thermodilution cardiac index 2.0 thick cardiac index 2.4  . Symptomatic bradycardia     on beta blocker  . Tricuspid regurgitation     moderate tricuspid regurgitation by echocardiogram, prior use of anorexic agents  . Melanoma 1995    removal on back   . Coronary artery disease     Moderate to severe coronary disease involving the left anterior descending artery and right coronary artery.  Sequential stenosis in the LAD is significant.  Right coronary artery is moderate to severely likely nonischemic.,  Questionable small LVOT obstruction.  No Brockenbrough maneuver was performed.  Catheterization January 2013  . PONV (postoperative nausea  and vomiting)   . Hypertension   . Dysrhythmia   . H/O hiatal hernia   . History of percutaneous coronary intervention     Successful angioplasty and drug-eluting stent placed to an 80% lesion in the proximal LAD.,  Ostial lesion in the LAD was estimated to be 60% with an FFR ratio of 0.83, medical therapy was recommended for the latter lesion.  Dual antiplatelet therapy for one year.  Procedure performed May 08, 2011   Past Surgical History  Procedure Date  . Hiatal hernia repair 1993  . Rotator cuff repair ~ 2001    right  . Coronary angioplasty with stent placement 05/08/11    "1"  . Tonsillectomy ~ 1944  . Shoulder arthroscopy ~ 2004; 2005    left; "joint's wore out"  . Cataract extraction w/ intraocular lens  implant, bilateral ~ 2002  . Tubal ligation 1972    Allergies/SH/FHX : available in Electronic Records for review  Allergies  Allergen Reactions  . Sulfamethoxazole     REACTION: rash   History   Social History  . Marital Status: Divorced    Spouse Name: N/A    Number  of Children: N/A  . Years of Education: N/A   Occupational History  . Not on file.   Social History Main Topics  . Smoking status: Former Smoker -- 1.0 packs/day for 20 years    Types: Cigarettes    Quit date: 04/26/1988  . Smokeless tobacco: Never Used  . Alcohol Use: No  . Drug Use: No  . Sexually Active: No   Other Topics Concern  . Not on file   Social History Narrative  . No narrative on file   Family History  Problem Relation Age of Onset  . Heart attack Father     Medications: Current Outpatient Prescriptions  Medication Sig Dispense Refill  . albuterol (PROAIR HFA) 108 (90 BASE) MCG/ACT inhaler Inhale 2 puffs into the lungs every 6 (six) hours as needed.      . ALPRAZolam (XANAX) 0.25 MG tablet Take 1/2 to 1 by mouth three times daily      . aspirin 81 MG chewable tablet Chew 1 tablet (81 mg total) by mouth daily.      . carvedilol (COREG) 3.125 MG tablet Take 1 tablet  (3.125 mg total) by mouth 2 (two) times daily with a meal.  60 tablet  6  . citalopram (CELEXA) 40 MG tablet Take 40 mg by mouth daily.        . clopidogrel (PLAVIX) 75 MG tablet Take 1 tablet (75 mg total) by mouth daily.  30 tablet  6  . ferrous sulfate 325 (65 FE) MG EC tablet Take 325 mg by mouth daily with breakfast.        . fexofenadine (ALLEGRA) 180 MG tablet Take 180 mg by mouth daily.        . furosemide (LASIX) 40 MG tablet Take 40 mg by mouth daily.      Marland Kitchen ibandronate (BONIVA) 150 MG tablet Take 150 mg by mouth every 30 (thirty) days. Take in the morning with a full glass of water, on an empty stomach, and do not take anything else by mouth or lie down for the next 30 min.       . isosorbide mononitrate (IMDUR) 30 MG 24 hr tablet Take 1 tablet (30 mg total) by mouth daily.  30 tablet  6  . losartan (COZAAR) 100 MG tablet Take 100 mg by mouth daily.        . meloxicam (MOBIC) 7.5 MG tablet Take 7.5 mg by mouth daily.        . Multiple Vitamin (MULITIVITAMIN WITH MINERALS) TABS Take 1 tablet by mouth daily. Centrum silver (equate)       . niacin (NIASPAN) 500 MG CR tablet Take 500 mg by mouth at bedtime.        . nitroGLYCERIN (NITROSTAT) 0.4 MG SL tablet Place 1 tablet (0.4 mg total) under the tongue every 5 (five) minutes as needed for chest pain.  25 tablet  3  . omega-3 acid ethyl esters (LOVAZA) 1 G capsule Take 1 g by mouth daily.        . pantoprazole (PROTONIX) 40 MG tablet Take 1 tablet (40 mg total) by mouth daily.  30 tablet  6  . potassium chloride (KLOR-CON) 10 MEQ CR tablet Take 10 mEq by mouth daily.        . rosuvastatin (CRESTOR) 10 MG tablet Take 10 mg by mouth daily.      . traZODone (DESYREL) 100 MG tablet Take 100 mg by mouth at bedtime.        Marland Kitchen  zolpidem (AMBIEN) 10 MG tablet Take 10 mg by mouth at bedtime.          ROS: No nausea or vomiting. No fever or chills.No melena or hematochezia.No bleeding.No claudication  Physical Exam: BP 126/67  Pulse 55  Ht 5'  4" (1.626 m)  Wt 151 lb (68.493 kg)  BMI 25.92 kg/m2 General: Well-nourished female in no distress Neck: Normal carotid upstroke no carotid bruits. No thyromegaly nonnodular thyroid. JVP is 6-7 cm Lungs: Clear breath sounds bilaterally without wheezing Cardiac: Regular rate and rhythm with normal S1-S2 no murmurs or gallops Vascular: No edema normal distal pulses Skin: Warm and dry. Physcologic: Normal affect not obtained  12lead ECG: Limited bedside ECHO:N/A No images are attached to the encounter.   I reviewed and summarized the old records. I reviewed ECG and prior blood work.  Assessment and Plan  Coronary artery disease No recurrent chest pain. Stable  History of percutaneous coronary intervention Currently no indication for ischemia testing but with ostial lesion future stress testing is indicated.  Symptomatic bradycardia Heart rate stable at 55 beats per minute period  Edema Likely related to amlodipine will discontinue.    Patient Active Problem List  Diagnosis  . HYPERCHOLESTEROLEMIA  . ANXIETY  . DEPRESSION, CHRONIC  . DIASTOLIC DYSFUNCTION  . GERD  . BARRETTS ESOPHAGUS  . CONSTIPATION  . DOE (dyspnea on exertion)  . Palpitations  . HTN (hypertension)  . Sinus bradycardia  . Pulmonary hypertension  . Coronary artery disease  . Tricuspid regurgitation  . Symptomatic bradycardia  . History of percutaneous coronary intervention  . Edema

## 2012-09-05 ENCOUNTER — Encounter (HOSPITAL_COMMUNITY): Payer: Self-pay

## 2012-09-05 ENCOUNTER — Emergency Department (HOSPITAL_COMMUNITY)
Admission: EM | Admit: 2012-09-05 | Discharge: 2012-09-05 | Disposition: A | Discharge status: Home / Self Care | Payer: Medicare Other | Source: Home / Self Care | Attending: Emergency Medicine | Admitting: Emergency Medicine

## 2012-09-05 DIAGNOSIS — Z87891 Personal history of nicotine dependence: Secondary | ICD-10-CM | POA: Insufficient documentation

## 2012-09-05 DIAGNOSIS — I509 Heart failure, unspecified: Secondary | ICD-10-CM | POA: Insufficient documentation

## 2012-09-05 DIAGNOSIS — Z7983 Long term (current) use of bisphosphonates: Secondary | ICD-10-CM | POA: Insufficient documentation

## 2012-09-05 DIAGNOSIS — I499 Cardiac arrhythmia, unspecified: Secondary | ICD-10-CM | POA: Insufficient documentation

## 2012-09-05 DIAGNOSIS — Z9861 Coronary angioplasty status: Secondary | ICD-10-CM | POA: Insufficient documentation

## 2012-09-05 DIAGNOSIS — Z85820 Personal history of malignant melanoma of skin: Secondary | ICD-10-CM | POA: Insufficient documentation

## 2012-09-05 DIAGNOSIS — I2510 Atherosclerotic heart disease of native coronary artery without angina pectoris: Secondary | ICD-10-CM | POA: Insufficient documentation

## 2012-09-05 DIAGNOSIS — L03119 Cellulitis of unspecified part of limb: Secondary | ICD-10-CM | POA: Insufficient documentation

## 2012-09-05 DIAGNOSIS — Z8719 Personal history of other diseases of the digestive system: Secondary | ICD-10-CM | POA: Insufficient documentation

## 2012-09-05 DIAGNOSIS — Z9889 Other specified postprocedural states: Secondary | ICD-10-CM | POA: Insufficient documentation

## 2012-09-05 DIAGNOSIS — F3289 Other specified depressive episodes: Secondary | ICD-10-CM | POA: Insufficient documentation

## 2012-09-05 DIAGNOSIS — F329 Major depressive disorder, single episode, unspecified: Secondary | ICD-10-CM | POA: Insufficient documentation

## 2012-09-05 DIAGNOSIS — L0390 Cellulitis, unspecified: Secondary | ICD-10-CM

## 2012-09-05 DIAGNOSIS — F411 Generalized anxiety disorder: Secondary | ICD-10-CM | POA: Insufficient documentation

## 2012-09-05 DIAGNOSIS — R6883 Chills (without fever): Secondary | ICD-10-CM | POA: Insufficient documentation

## 2012-09-05 DIAGNOSIS — Z79899 Other long term (current) drug therapy: Secondary | ICD-10-CM | POA: Insufficient documentation

## 2012-09-05 DIAGNOSIS — L02419 Cutaneous abscess of limb, unspecified: Secondary | ICD-10-CM | POA: Insufficient documentation

## 2012-09-05 DIAGNOSIS — Z7982 Long term (current) use of aspirin: Secondary | ICD-10-CM | POA: Insufficient documentation

## 2012-09-05 DIAGNOSIS — E7800 Pure hypercholesterolemia, unspecified: Secondary | ICD-10-CM | POA: Insufficient documentation

## 2012-09-05 DIAGNOSIS — Z791 Long term (current) use of non-steroidal anti-inflammatories (NSAID): Secondary | ICD-10-CM | POA: Insufficient documentation

## 2012-09-05 DIAGNOSIS — I10 Essential (primary) hypertension: Secondary | ICD-10-CM | POA: Insufficient documentation

## 2012-09-05 DIAGNOSIS — K219 Gastro-esophageal reflux disease without esophagitis: Secondary | ICD-10-CM | POA: Insufficient documentation

## 2012-09-05 DIAGNOSIS — I5030 Unspecified diastolic (congestive) heart failure: Secondary | ICD-10-CM | POA: Insufficient documentation

## 2012-09-05 DIAGNOSIS — Z8679 Personal history of other diseases of the circulatory system: Secondary | ICD-10-CM | POA: Insufficient documentation

## 2012-09-05 LAB — CBC WITH DIFFERENTIAL/PLATELET
Basophils Absolute: 0 10*3/uL (ref 0.0–0.1)
Basophils Relative: 0 % (ref 0–1)
Eosinophils Absolute: 0 10*3/uL (ref 0.0–0.7)
HCT: 33.8 % — ABNORMAL LOW (ref 36.0–46.0)
Hemoglobin: 11 g/dL — ABNORMAL LOW (ref 12.0–15.0)
Lymphocytes Relative: 7 % — ABNORMAL LOW (ref 12–46)
Lymphs Abs: 0.8 10*3/uL (ref 0.7–4.0)
MCHC: 32.5 g/dL (ref 30.0–36.0)
MCV: 80.7 fL (ref 78.0–100.0)
Monocytes Relative: 7 % (ref 3–12)
Neutro Abs: 9.1 10*3/uL — ABNORMAL HIGH (ref 1.7–7.7)
RDW: 13.6 % (ref 11.5–15.5)

## 2012-09-05 LAB — BASIC METABOLIC PANEL
BUN: 25 mg/dL — ABNORMAL HIGH (ref 6–23)
CO2: 24 mEq/L (ref 19–32)
Glucose, Bld: 103 mg/dL — ABNORMAL HIGH (ref 70–99)
Potassium: 3.5 mEq/L (ref 3.5–5.1)
Sodium: 129 mEq/L — ABNORMAL LOW (ref 135–145)

## 2012-09-05 MED ORDER — DEXTROSE 5 % IV SOLN
1.0000 g | INTRAVENOUS | Status: DC
  Administered 2012-09-05: 1 g via INTRAVENOUS
  Filled 2012-09-05: qty 10

## 2012-09-05 MED ORDER — TRAMADOL HCL 50 MG PO TABS: 25.0000 mg | ORAL_TABLET | Freq: Four times a day (QID) | ORAL | Status: DC | PRN

## 2012-09-05 MED ORDER — SODIUM CHLORIDE 0.9 % IV SOLN
INTRAVENOUS | Status: DC
  Administered 2012-09-05: 16:00:00 via INTRAVENOUS

## 2012-09-05 MED ORDER — CEPHALEXIN 500 MG PO CAPS: 500.0000 mg | ORAL_CAPSULE | Freq: Three times a day (TID) | ORAL | Status: DC

## 2012-09-05 NOTE — ED Provider Notes (Signed)
History    CSN: [id number] Arrival date & time 09/05/12  1343 First MD Initiated Contact with Patient 09/05/12 1506      Chief Complaint  Patient presents with  . Wound Infection     HPI Pt noticed redness on her inner thigh that started yesterday.  It seems to be getting a little bigger and a little more painful.  No known fevers although she felt chilled. She has noticed a little sore on her toe in the same leg but did not think much of it.  No vomiting, no diarrhea.  Past Medical History  Diagnosis Date  . Depressive disorder, not elsewhere classified   . Anxiety state, unspecified   . Pure hypercholesterolemia   . Esophageal reflux   . Infection of esophagostomy   . Congestive heart failure, unspecified     diastolic heart failure  . Pulmonary hypertension     PA systolic pressure 55-65 mmHg by echocardiogram, PA pressure 33/10 by cardiac catheterization PA saturation 62% thermodilution cardiac index 2.0 thick cardiac index 2.4  . Symptomatic bradycardia     on beta blocker  . Tricuspid regurgitation     moderate tricuspid regurgitation by echocardiogram, prior use of anorexic agents  . Melanoma 1995    removal on back   . Coronary artery disease     Moderate to severe coronary disease involving the left anterior descending artery and right coronary artery.  Sequential stenosis in the LAD is significant.  Right coronary artery is moderate to severely likely nonischemic.,  Questionable small LVOT obstruction.  No Brockenbrough maneuver was performed.  Catheterization January 2013  . PONV (postoperative nausea and vomiting)   . Hypertension   . Dysrhythmia   . H/O hiatal hernia   . History of percutaneous coronary intervention     Successful angioplasty and drug-eluting stent placed to an 80% lesion in the proximal LAD.,  Ostial lesion in the LAD was estimated to be 60% with an FFR ratio of 0.83, medical therapy was recommended for the latter lesion.  Dual antiplatelet therapy  for one year.  Procedure performed May 08, 2011    Past Surgical History  Procedure Laterality Date  . Hiatal hernia repair  1993  . Rotator cuff repair  ~ 2001    right  . Coronary angioplasty with stent placement  05/08/11    "1"  . Tonsillectomy  ~ 1944  . Shoulder arthroscopy  ~ 2004; 2005    left; "joint's wore out"  . Cataract extraction w/ intraocular lens  implant, bilateral  ~ 2002  . Tubal ligation  1972    Family History  Problem Relation Age of Onset  . Heart attack Father     History  Substance Use Topics  . Smoking status: Former Smoker -- 1.00 packs/day for 20 years    Types: Cigarettes    Quit date: 04/26/1988  . Smokeless tobacco: Never Used  . Alcohol Use: No    OB History   Grav Para Term Preterm Abortions TAB SAB Ect Mult Living                  Review of Systems  All other systems reviewed and are negative.    Allergies  Sulfamethoxazole  Home Medications   Current Outpatient Rx  Name  Route  Sig  Dispense  Refill  . albuterol (PROAIR HFA) 108 (90 BASE) MCG/ACT inhaler   Inhalation   Inhale 2 puffs into the lungs every 6 (six) hours as needed.         Marland Kitchen  alendronate (FOSAMAX) 70 MG tablet   Oral   Take 70 mg by mouth every 7 (seven) days. Take with a full glass of water on an empty stomach.         . ALPRAZolam (XANAX) 0.25 MG tablet      Take 1/2 to 1 by mouth three times daily         . aspirin 81 MG chewable tablet   Oral   Chew 81 mg by mouth every evening.         . carvedilol (COREG) 3.125 MG tablet   Oral   Take 1 tablet (3.125 mg total) by mouth 2 (two) times daily with a meal.   60 tablet   6   . citalopram (CELEXA) 40 MG tablet   Oral   Take 40 mg by mouth daily.           . ferrous sulfate 325 (65 FE) MG EC tablet   Oral   Take 325 mg by mouth daily with breakfast.           . furosemide (LASIX) 40 MG tablet   Oral   Take 40 mg by mouth daily.         Marland Kitchen losartan (COZAAR) 100 MG tablet    Oral   Take 100 mg by mouth daily.           . meloxicam (MOBIC) 7.5 MG tablet   Oral   Take 7.5 mg by mouth daily.           . Multiple Vitamin (MULITIVITAMIN WITH MINERALS) TABS   Oral   Take 1 tablet by mouth daily. Centrum silver (equate)          . nitroGLYCERIN (NITROSTAT) 0.4 MG SL tablet   Sublingual   Place 1 tablet (0.4 mg total) under the tongue every 5 (five) minutes as needed for chest pain.   25 tablet   3   . pantoprazole (PROTONIX) 40 MG tablet   Oral   Take 1 tablet (40 mg total) by mouth daily.   30 tablet   6   . potassium chloride (KLOR-CON) 10 MEQ CR tablet   Oral   Take 10 mEq by mouth daily.           . rosuvastatin (CRESTOR) 10 MG tablet   Oral   Take 10 mg by mouth daily.         . traZODone (DESYREL) 100 MG tablet   Oral   Take 100 mg by mouth at bedtime.           Marland Kitchen zolpidem (AMBIEN) 10 MG tablet   Oral   Take 10 mg by mouth at bedtime.           . cephALEXin (KEFLEX) 500 MG capsule   Oral   Take 1 capsule (500 mg total) by mouth 3 (three) times daily.   30 capsule   0   . traMADol (ULTRAM) 50 MG tablet   Oral   Take 0.5 tablets (25 mg total) by mouth every 6 (six) hours as needed for pain.   15 tablet   0     BP 153/86  Pulse 98  Temp(Src) 98 F (36.7 C) (Oral)  Resp 20  Ht 5\' 2"  (1.575 m)  Wt 151 lb (68.493 kg)  BMI 27.61 kg/m2  SpO2 97%  Physical Exam  Nursing note and vitals reviewed. Constitutional: She appears well-developed and well-nourished. No distress.  HENT:  Head: Normocephalic  and atraumatic.  Right Ear: External ear normal.  Left Ear: External ear normal.  Eyes: Conjunctivae are normal. Right eye exhibits no discharge. Left eye exhibits no discharge. No scleral icterus.  Neck: Neck supple. No tracheal deviation present.  Cardiovascular: Normal rate, regular rhythm and intact distal pulses.   Pulmonary/Chest: Effort normal and breath sounds normal. No stridor. No respiratory distress. She  has no wheezes. She has no rales.  Abdominal: Soft. Bowel sounds are normal. She exhibits no distension. There is no tenderness. There is no rebound and no guarding.  Musculoskeletal: She exhibits no edema and no tenderness.  Small ulceration right great toe adjacent to second toe, no erythema, no drainage; area of erythema and calor left medial proximal thigh, no induration, no fluctuance, no hernia  Neurological: She is alert. She has normal strength. No sensory deficit. Cranial nerve deficit:  no gross defecits noted. She exhibits normal muscle tone. She displays no seizure activity. Coordination normal.  Skin: Skin is warm and dry. No rash noted.  Psychiatric: She has a normal mood and affect.    ED Course  Procedures (including critical care time) Medications  0.9 %  sodium chloride infusion ( Intravenous New Bag/Given 09/05/12 1607)  cefTRIAXone (ROCEPHIN) 1 g in dextrose 5 % 50 mL IVPB (0 g Intravenous Stopped 09/05/12 1748)    Labs Reviewed  CBC WITH DIFFERENTIAL - Abnormal; Notable for the following:    WBC 10.7 (*)    Hemoglobin 11.0 (*)    HCT 33.8 (*)    Platelets 88 (*)    Neutrophils Relative % 86 (*)    Lymphocytes Relative 7 (*)    Neutro Abs 9.1 (*)    All other components within normal limits  BASIC METABOLIC PANEL - Abnormal; Notable for the following:    Sodium 129 (*)    Chloride 93 (*)    Glucose, Bld 103 (*)    BUN 25 (*)    Creatinine, Ser 1.99 (*)    GFR calc non Af Amer 23 (*)    GFR calc Af Amer 27 (*)    All other components within normal limits   No results found.   1. Cellulitis       MDM  Patient was given a dose of IV Rocephin for a cellulitis of her right side. The patient does not appear toxic. Her laboratory tests show mild abnormalities these very well may be chronic. The patient has not had any trouble with vomiting or diarrhea. She is not hypotensive. I discussed with the family about a   course of  antibiotics as an outpatient. She is  instructed to follow up with her doctor in the next 24-48 hours to be rechecked. She is to return to the emergency room for worsening symptoms.        Celene Kras, MD 09/05/12 [id number]

## 2012-09-05 NOTE — ED Notes (Signed)
Pt from home and reports that she started having redness and selling to upper R thigh on Friday. Pt now has large area of swelling, redness with warmth to upper thigh. No open sores noted and pt does not know a source of the swelling. Pt is A&O and in NAD. Pt denies CP, SOB.

## 2012-09-05 NOTE — ED Notes (Signed)
Unable to obtain blood due to blowing veins.Phlebotomy called

## 2012-09-05 NOTE — ED Notes (Signed)
Pt refused to ask MD for pain meds. Pt aware to press call bell if she chnages her mind

## 2012-09-05 NOTE — ED Notes (Signed)
She c/o erythema at right upper, inner thigh/groin area.  She points out a wound at inner right great toe area which is in appearance as a popped vesicle, and is not infected in appearance. She c/o one isolated incidence of chills yesterday afternoon.  She is in no distress.

## 2012-09-06 ENCOUNTER — Encounter (HOSPITAL_COMMUNITY): Payer: Self-pay | Admitting: Emergency Medicine

## 2012-09-06 ENCOUNTER — Emergency Department (HOSPITAL_COMMUNITY): Payer: Medicare Other

## 2012-09-06 ENCOUNTER — Inpatient Hospital Stay (HOSPITAL_COMMUNITY)
Admission: EM | Admit: 2012-09-06 | Discharge: 2012-09-12 | DRG: 871 | Disposition: A | Discharge status: Home Health | Payer: Medicare Other | Attending: Internal Medicine | Admitting: Internal Medicine

## 2012-09-06 ENCOUNTER — Inpatient Hospital Stay (HOSPITAL_COMMUNITY): Payer: Medicare Other

## 2012-09-06 DIAGNOSIS — A419 Sepsis, unspecified organism: Principal | ICD-10-CM | POA: Diagnosis present

## 2012-09-06 DIAGNOSIS — R0609 Other forms of dyspnea: Secondary | ICD-10-CM

## 2012-09-06 DIAGNOSIS — R001 Bradycardia, unspecified: Secondary | ICD-10-CM

## 2012-09-06 DIAGNOSIS — Z87891 Personal history of nicotine dependence: Secondary | ICD-10-CM

## 2012-09-06 DIAGNOSIS — N179 Acute kidney failure, unspecified: Secondary | ICD-10-CM

## 2012-09-06 DIAGNOSIS — I5030 Unspecified diastolic (congestive) heart failure: Secondary | ICD-10-CM | POA: Diagnosis present

## 2012-09-06 DIAGNOSIS — D696 Thrombocytopenia, unspecified: Secondary | ICD-10-CM

## 2012-09-06 DIAGNOSIS — I2510 Atherosclerotic heart disease of native coronary artery without angina pectoris: Secondary | ICD-10-CM

## 2012-09-06 DIAGNOSIS — L03115 Cellulitis of right lower limb: Secondary | ICD-10-CM

## 2012-09-06 DIAGNOSIS — K219 Gastro-esophageal reflux disease without esophagitis: Secondary | ICD-10-CM

## 2012-09-06 DIAGNOSIS — L03119 Cellulitis of unspecified part of limb: Secondary | ICD-10-CM

## 2012-09-06 DIAGNOSIS — I272 Pulmonary hypertension, unspecified: Secondary | ICD-10-CM

## 2012-09-06 DIAGNOSIS — R0600 Dyspnea, unspecified: Secondary | ICD-10-CM

## 2012-09-06 DIAGNOSIS — F411 Generalized anxiety disorder: Secondary | ICD-10-CM

## 2012-09-06 DIAGNOSIS — K2270 Barrett's esophagus without dysplasia: Secondary | ICD-10-CM

## 2012-09-06 DIAGNOSIS — Z8249 Family history of ischemic heart disease and other diseases of the circulatory system: Secondary | ICD-10-CM

## 2012-09-06 DIAGNOSIS — D638 Anemia in other chronic diseases classified elsewhere: Secondary | ICD-10-CM | POA: Diagnosis present

## 2012-09-06 DIAGNOSIS — I519 Heart disease, unspecified: Secondary | ICD-10-CM

## 2012-09-06 DIAGNOSIS — I4891 Unspecified atrial fibrillation: Secondary | ICD-10-CM

## 2012-09-06 DIAGNOSIS — I071 Rheumatic tricuspid insufficiency: Secondary | ICD-10-CM

## 2012-09-06 DIAGNOSIS — E871 Hypo-osmolality and hyponatremia: Secondary | ICD-10-CM

## 2012-09-06 DIAGNOSIS — R002 Palpitations: Secondary | ICD-10-CM

## 2012-09-06 DIAGNOSIS — I4892 Unspecified atrial flutter: Secondary | ICD-10-CM

## 2012-09-06 DIAGNOSIS — L02419 Cutaneous abscess of limb, unspecified: Secondary | ICD-10-CM | POA: Diagnosis present

## 2012-09-06 DIAGNOSIS — F3289 Other specified depressive episodes: Secondary | ICD-10-CM

## 2012-09-06 DIAGNOSIS — K5900 Constipation, unspecified: Secondary | ICD-10-CM

## 2012-09-06 DIAGNOSIS — E7800 Pure hypercholesterolemia, unspecified: Secondary | ICD-10-CM

## 2012-09-06 DIAGNOSIS — I10 Essential (primary) hypertension: Secondary | ICD-10-CM

## 2012-09-06 DIAGNOSIS — R6521 Severe sepsis with septic shock: Secondary | ICD-10-CM | POA: Diagnosis present

## 2012-09-06 DIAGNOSIS — Z9861 Coronary angioplasty status: Secondary | ICD-10-CM

## 2012-09-06 DIAGNOSIS — R609 Edema, unspecified: Secondary | ICD-10-CM

## 2012-09-06 DIAGNOSIS — I2789 Other specified pulmonary heart diseases: Secondary | ICD-10-CM | POA: Diagnosis present

## 2012-09-06 DIAGNOSIS — E876 Hypokalemia: Secondary | ICD-10-CM

## 2012-09-06 DIAGNOSIS — F329 Major depressive disorder, single episode, unspecified: Secondary | ICD-10-CM

## 2012-09-06 DIAGNOSIS — I509 Heart failure, unspecified: Secondary | ICD-10-CM | POA: Diagnosis present

## 2012-09-06 LAB — LACTIC ACID, PLASMA: Lactic Acid, Venous: 2.2 mmol/L (ref 0.5–2.2)

## 2012-09-06 LAB — TROPONIN I: Troponin I: <0.3 ng/mL (ref <0.30)

## 2012-09-06 LAB — COMPREHENSIVE METABOLIC PANEL
ALT: 9 U/L (ref 0–35)
AST: 25 U/L (ref 0–37)
Albumin: 2.9 g/dL — ABNORMAL LOW (ref 3.5–5.2)
Alkaline Phosphatase: 47 U/L (ref 39–117)
Chloride: 90 mEq/L — ABNORMAL LOW (ref 96–112)
Potassium: 3 mEq/L — ABNORMAL LOW (ref 3.5–5.1)
Sodium: 127 mEq/L — ABNORMAL LOW (ref 135–145)
Total Bilirubin: 0.6 mg/dL (ref 0.3–1.2)

## 2012-09-06 LAB — URINALYSIS, ROUTINE W REFLEX MICROSCOPIC
Bilirubin Urine: NEGATIVE
Glucose, UA: NEGATIVE mg/dL
Ketones, ur: NEGATIVE mg/dL
Protein, ur: 100 mg/dL — AB

## 2012-09-06 LAB — CBC WITH DIFFERENTIAL/PLATELET
Basophils Relative: 0 % (ref 0–1)
Eosinophils Relative: 0 % (ref 0–5)
HCT: 35.5 % — ABNORMAL LOW (ref 36.0–46.0)
Hemoglobin: 12.1 g/dL (ref 12.0–15.0)
Lymphocytes Relative: 4 % — ABNORMAL LOW (ref 12–46)
Neutrophils Relative %: 92 % — ABNORMAL HIGH (ref 43–77)
RBC: 4.43 MIL/uL (ref 3.87–5.11)

## 2012-09-06 LAB — URINE MICROSCOPIC-ADD ON

## 2012-09-06 LAB — MAGNESIUM: Magnesium: 1.8 mg/dL (ref 1.5–2.5)

## 2012-09-06 LAB — TSH: TSH: 0.991 u[IU]/mL (ref 0.350–4.500)

## 2012-09-06 MED ORDER — DILTIAZEM HCL 100 MG IV SOLR
10.0000 mg/h | INTRAVENOUS | Status: DC
  Administered 2012-09-06: 10 mg/h via INTRAVENOUS
  Filled 2012-09-06: qty 100

## 2012-09-06 MED ORDER — PANTOPRAZOLE SODIUM 40 MG PO TBEC
40.0000 mg | DELAYED_RELEASE_TABLET | Freq: Every day | ORAL | Status: DC
  Administered 2012-09-06 – 2012-09-12 (×8): 40 mg via ORAL
  Filled 2012-09-06 (×7): qty 1

## 2012-09-06 MED ORDER — IBUPROFEN 800 MG PO TABS
800.0000 mg | ORAL_TABLET | Freq: Once | ORAL | Status: AC
  Administered 2012-09-06: 800 mg via ORAL
  Filled 2012-09-06: qty 1

## 2012-09-06 MED ORDER — ACETAMINOPHEN 325 MG PO TABS: 650.0000 mg | ORAL_TABLET | Freq: Four times a day (QID) | ORAL | Status: DC | PRN

## 2012-09-06 MED ORDER — VANCOMYCIN HCL IN DEXTROSE 750-5 MG/150ML-% IV SOLN
750.0000 mg | INTRAVENOUS | Status: DC
  Administered 2012-09-07 – 2012-09-10 (×4): 750 mg via INTRAVENOUS
  Filled 2012-09-06 (×5): qty 150

## 2012-09-06 MED ORDER — ATORVASTATIN CALCIUM 20 MG PO TABS
20.0000 mg | ORAL_TABLET | Freq: Every day | ORAL | Status: DC
  Administered 2012-09-06 – 2012-09-11 (×6): 20 mg via ORAL
  Filled 2012-09-06 (×6): qty 1

## 2012-09-06 MED ORDER — ACETAMINOPHEN 650 MG RE SUPP: 650.0000 mg | Freq: Four times a day (QID) | RECTAL | Status: DC | PRN

## 2012-09-06 MED ORDER — TRAZODONE HCL 50 MG PO TABS
100.0000 mg | ORAL_TABLET | Freq: Every day | ORAL | Status: DC
  Administered 2012-09-07 – 2012-09-11 (×5): 100 mg via ORAL
  Filled 2012-09-06 (×5): qty 2

## 2012-09-06 MED ORDER — DEXTROSE 5 % IV SOLN
30.0000 ug/min | INTRAVENOUS | Status: DC
  Administered 2012-09-06: 50 ug/min via INTRAVENOUS
  Filled 2012-09-06: qty 1

## 2012-09-06 MED ORDER — PIPERACILLIN-TAZOBACTAM 3.375 G IVPB
3.3750 g | Freq: Three times a day (TID) | INTRAVENOUS | Status: DC
  Administered 2012-09-06 – 2012-09-12 (×17): 3.375 g via INTRAVENOUS
  Filled 2012-09-06 (×25): qty 50

## 2012-09-06 MED ORDER — SODIUM CHLORIDE 0.9 % IV SOLN: INTRAVENOUS | Status: DC

## 2012-09-06 MED ORDER — TRAMADOL HCL 50 MG PO TABS: 25.0000 mg | ORAL_TABLET | Freq: Four times a day (QID) | ORAL | Status: DC | PRN

## 2012-09-06 MED ORDER — DEXTROSE 5 % IV SOLN
1.0000 g | INTRAVENOUS | Status: DC
  Administered 2012-09-06: 1 g via INTRAVENOUS
  Filled 2012-09-06: qty 10

## 2012-09-06 MED ORDER — ZOLPIDEM TARTRATE 5 MG PO TABS: 10.0000 mg | ORAL_TABLET | Freq: Every day | ORAL | Status: DC

## 2012-09-06 MED ORDER — DILTIAZEM HCL 25 MG/5ML IV SOLN
15.0000 mg | Freq: Once | INTRAVENOUS | Status: AC
  Administered 2012-09-06: 15 mg via INTRAVENOUS

## 2012-09-06 MED ORDER — POTASSIUM CHLORIDE CRYS ER 20 MEQ PO TBCR
40.0000 meq | EXTENDED_RELEASE_TABLET | ORAL | Status: AC
  Administered 2012-09-06 (×2): 40 meq via ORAL
  Filled 2012-09-06: qty 4

## 2012-09-06 MED ORDER — ONDANSETRON HCL 4 MG PO TABS: 4.0000 mg | ORAL_TABLET | Freq: Four times a day (QID) | ORAL | Status: DC | PRN

## 2012-09-06 MED ORDER — VANCOMYCIN HCL IN DEXTROSE 1-5 GM/200ML-% IV SOLN
1000.0000 mg | Freq: Once | INTRAVENOUS | Status: AC
  Administered 2012-09-06: 1000 mg via INTRAVENOUS
  Filled 2012-09-06: qty 200

## 2012-09-06 MED ORDER — SODIUM CHLORIDE 0.9 % IJ SOLN
10.0000 mL | INTRAMUSCULAR | Status: DC | PRN
  Administered 2012-09-07: 10 mL

## 2012-09-06 MED ORDER — ASPIRIN 81 MG PO CHEW
81.0000 mg | CHEWABLE_TABLET | Freq: Every evening | ORAL | Status: DC
  Administered 2012-09-06 – 2012-09-11 (×6): 81 mg via ORAL
  Filled 2012-09-06 (×6): qty 1

## 2012-09-06 MED ORDER — PHENYLEPHRINE HCL 10 MG/ML IJ SOLN
INTRAMUSCULAR | Status: AC
  Filled 2012-09-06: qty 1

## 2012-09-06 MED ORDER — ALPRAZOLAM 0.25 MG PO TABS: 0.1250 mg | ORAL_TABLET | Freq: Three times a day (TID) | ORAL | Status: DC | PRN

## 2012-09-06 MED ORDER — PHENYLEPHRINE HCL 10 MG/ML IJ SOLN
INTRAMUSCULAR | Status: AC
  Filled 2012-09-06: qty 2

## 2012-09-06 MED ORDER — ONDANSETRON HCL 4 MG/2ML IJ SOLN: 4.0000 mg | Freq: Four times a day (QID) | INTRAMUSCULAR | Status: DC | PRN

## 2012-09-06 MED ORDER — POTASSIUM CHLORIDE IN NACL 20-0.9 MEQ/L-% IV SOLN
INTRAVENOUS | Status: DC
  Administered 2012-09-06 – 2012-09-07 (×2): via INTRAVENOUS

## 2012-09-06 MED ORDER — FERROUS SULFATE 325 (65 FE) MG PO TABS
325.0000 mg | ORAL_TABLET | Freq: Every day | ORAL | Status: DC
  Administered 2012-09-07 – 2012-09-12 (×6): 325 mg via ORAL
  Filled 2012-09-06 (×6): qty 1

## 2012-09-06 MED ORDER — ALBUTEROL SULFATE HFA 108 (90 BASE) MCG/ACT IN AERS: 2.0000 | INHALATION_SPRAY | Freq: Four times a day (QID) | RESPIRATORY_TRACT | Status: DC | PRN

## 2012-09-06 MED ORDER — DEXTROSE 5 % IV SOLN
INTRAVENOUS | Status: AC
  Filled 2012-09-06: qty 10

## 2012-09-06 MED ORDER — CITALOPRAM HYDROBROMIDE 20 MG PO TABS
20.0000 mg | ORAL_TABLET | Freq: Every day | ORAL | Status: DC
  Administered 2012-09-06 – 2012-09-12 (×7): 20 mg via ORAL
  Filled 2012-09-06 (×7): qty 1

## 2012-09-06 MED ORDER — ZOLPIDEM TARTRATE 5 MG PO TABS
5.0000 mg | ORAL_TABLET | Freq: Every day | ORAL | Status: DC
  Administered 2012-09-07 – 2012-09-11 (×5): 5 mg via ORAL
  Filled 2012-09-06 (×5): qty 1

## 2012-09-06 MED ORDER — PHENYLEPHRINE HCL 10 MG/ML IJ SOLN
30.0000 ug/min | INTRAMUSCULAR | Status: DC
  Administered 2012-09-06: 100 ug/min via INTRAVENOUS
  Administered 2012-09-07: 75 ug/min via INTRAVENOUS
  Administered 2012-09-07: 200 ug/min via INTRAVENOUS
  Administered 2012-09-07: 50 ug/min via INTRAVENOUS
  Filled 2012-09-06 (×2): qty 2

## 2012-09-06 MED ORDER — SODIUM CHLORIDE 0.9 % IJ SOLN
10.0000 mL | Freq: Two times a day (BID) | INTRAMUSCULAR | Status: DC
  Administered 2012-09-06: 10 mL
  Administered 2012-09-07: 30 mL
  Administered 2012-09-07 – 2012-09-10 (×5): 10 mL

## 2012-09-06 MED ORDER — SODIUM CHLORIDE 0.9 % IV SOLN
Freq: Once | INTRAVENOUS | Status: AC
  Administered 2012-09-06: 14:00:00 via INTRAVENOUS

## 2012-09-06 MED ORDER — PIPERACILLIN-TAZOBACTAM 3.375 G IVPB
INTRAVENOUS | Status: AC
  Filled 2012-09-06: qty 100

## 2012-09-06 MED ORDER — SODIUM CHLORIDE 0.9 % IV BOLUS (SEPSIS)
1000.0000 mL | Freq: Once | INTRAVENOUS | Status: AC
  Administered 2012-09-06: 1000 mL via INTRAVENOUS

## 2012-09-06 NOTE — Procedures (Signed)
Central Venous Catheter Insertion Procedure Note Saoirse Legere Kropp [id number] [birth year]  Procedure: Insertion of Central Venous Catheter Indications: Assessment of intravascular volume, Drug and/or fluid administration and Frequent blood sampling  Procedure Details Consent: Risks of procedure as well as the alternatives and risks of each were explained to the (patient/caregiver).  Consent for procedure obtained. Time Out: Verified patient identification, verified procedure, site/side was marked, verified correct patient position, special equipment/implants available, medications/allergies/relevent history reviewed, required imaging and test results available.  Performed  Maximum sterile technique was used including antiseptics, gloves, gown, hand hygiene, mask and sheet. Skin prep: Chlorhexidine; local anesthetic administered A antimicrobial bonded/coated triple lumen catheter was placed in the left subclavian vein using the Seldinger technique.  Evaluation Blood flow good Complications: No apparent complications Patient did tolerate procedure well. Chest X-Jatoria ordered to verify placement.  CXR: pending.  Jermany Sundell A 09/06/2012, 11:22 PM

## 2012-09-06 NOTE — Progress Notes (Signed)
eLink Physician-Brief Progress Note Patient Name: [name] DOB: December 07, 1934 MRN: [id number]  Date of Service  09/06/2012   HPI/Events of Note   Hypotensive, nml lactate  eICU Interventions  NS bolus 1L over 2h   Intervention Category Intermediate Interventions: Hypotension - evaluation and management  ALVA,RAKESH V. 09/06/2012, 8:17 PM

## 2012-09-06 NOTE — ED Notes (Signed)
Pt c/o redness to right thigh since Tuesday, some weakness/nausea. Pt seen at [facility] ED yesterday and dx with cellulitis-tx with IV rocephin and sent home on po cephalexin. Pt states redness has spread since yesterday. Nad noted.

## 2012-09-06 NOTE — ED Provider Notes (Addendum)
History  This chart was scribed for Osvaldo Human, MD by Greggory Stallion, ED Scribe. This patient was seen in room APA09/APA09 and the patient's care was started at 1:04 PM.  CSN: [id number]  Arrival date & time 09/06/12  1024    Chief Complaint  Patient presents with  . Cellulitis    The history is provided by the patient. No language interpreter was used.    HPI Comments: [name] is a 78 y.o. female who presents to the Emergency Department complaining of cellulitis on her right leg that started Tuesday. Pt's family membrane states she had a temperature of 101.3. Pt states she has an injury to her right big toe. She states she was seen at [facility] yesterday for the same thing and was told to come back if she got a fever. Pt's family member states she's had diarrhea and nothing to eat since Thursday. She states she had a cough yesterday. Pt denies neck pain, sore throat, visual disturbance, LOC episodes, CP, SOB, abdominal pain, nausea, emesis, urinary symptoms, back pain, HA, weakness, and numbness as associated symptoms.    Past Medical History  Diagnosis Date  . Depressive disorder, not elsewhere classified   . Anxiety state, unspecified   . Pure hypercholesterolemia   . Esophageal reflux   . Infection of esophagostomy   . Congestive heart failure, unspecified     diastolic heart failure  . Pulmonary hypertension     PA systolic pressure 55-65 mmHg by echocardiogram, PA pressure 33/10 by cardiac catheterization PA saturation 62% thermodilution cardiac index 2.0 thick cardiac index 2.4  . Symptomatic bradycardia     on beta blocker  . Tricuspid regurgitation     moderate tricuspid regurgitation by echocardiogram, prior use of anorexic agents  . Melanoma 1995    removal on back   . Coronary artery disease     Moderate to severe coronary disease involving the left anterior descending artery and right coronary artery.  Sequential stenosis in the LAD is significant.  Right  coronary artery is moderate to severely likely nonischemic.,  Questionable small LVOT obstruction.  No Brockenbrough maneuver was performed.  Catheterization January 2013  . PONV (postoperative nausea and vomiting)   . Hypertension   . Dysrhythmia   . H/O hiatal hernia   . History of percutaneous coronary intervention     Successful angioplasty and drug-eluting stent placed to an 80% lesion in the proximal LAD.,  Ostial lesion in the LAD was estimated to be 60% with an FFR ratio of 0.83, medical therapy was recommended for the latter lesion.  Dual antiplatelet therapy for one year.  Procedure performed May 08, 2011    Past Surgical History  Procedure Laterality Date  . Hiatal hernia repair  1993  . Rotator cuff repair  ~ 2001    right  . Coronary angioplasty with stent placement  05/08/11    "1"  . Tonsillectomy  ~ 1944  . Shoulder arthroscopy  ~ 2004; 2005    left; "joint's wore out"  . Cataract extraction w/ intraocular lens  implant, bilateral  ~ 2002  . Tubal ligation  1972    Family History  Problem Relation Age of Onset  . Heart attack Father     History  Substance Use Topics  . Smoking status: Former Smoker -- 1.00 packs/day for 20 years    Types: Cigarettes    Quit date: 04/26/1988  . Smokeless tobacco: Never Used  . Alcohol Use: No  OB History   Grav Para Term Preterm Abortions TAB SAB Ect Mult Living                  Review of Systems  Constitutional: Positive for fever. Negative for chills.  HENT: Negative for sore throat and neck pain.   Eyes: Negative for visual disturbance.  Respiratory: Negative for shortness of breath.   Cardiovascular: Negative for chest pain.  Gastrointestinal: Positive for diarrhea. Negative for nausea and vomiting.  Genitourinary: Negative for dysuria and difficulty urinating.  Musculoskeletal: Negative for back pain.  Skin: Positive for rash.  Neurological: Negative for headaches.  Psychiatric/Behavioral: Negative for  confusion.  All other systems reviewed and are negative.    Allergies  Sulfamethoxazole  Home Medications   Current Outpatient Rx  Name  Route  Sig  Dispense  Refill  . albuterol (PROAIR HFA) 108 (90 BASE) MCG/ACT inhaler   Inhalation   Inhale 2 puffs into the lungs every 6 (six) hours as needed.         Marland Kitchen alendronate (FOSAMAX) 70 MG tablet   Oral   Take 70 mg by mouth every 7 (seven) days. Take with a full glass of water on an empty stomach.         . ALPRAZolam (XANAX) 0.25 MG tablet      Take 1/2 to 1 by mouth three times daily         . aspirin 81 MG chewable tablet   Oral   Chew 81 mg by mouth every evening.         . carvedilol (COREG) 3.125 MG tablet   Oral   Take 1 tablet (3.125 mg total) by mouth 2 (two) times daily with a meal.   60 tablet   6   . cephALEXin (KEFLEX) 500 MG capsule   Oral   Take 1 capsule (500 mg total) by mouth 3 (three) times daily.   30 capsule   0   . citalopram (CELEXA) 40 MG tablet   Oral   Take 40 mg by mouth daily.           . ferrous sulfate 325 (65 FE) MG EC tablet   Oral   Take 325 mg by mouth daily with breakfast.           . furosemide (LASIX) 40 MG tablet   Oral   Take 40 mg by mouth daily.         Marland Kitchen losartan (COZAAR) 100 MG tablet   Oral   Take 100 mg by mouth daily.           . meloxicam (MOBIC) 7.5 MG tablet   Oral   Take 7.5 mg by mouth daily.           . Multiple Vitamin (MULITIVITAMIN WITH MINERALS) TABS   Oral   Take 1 tablet by mouth daily. Centrum silver (equate)          . EXPIRED: nitroGLYCERIN (NITROSTAT) 0.4 MG SL tablet   Sublingual   Place 1 tablet (0.4 mg total) under the tongue every 5 (five) minutes as needed for chest pain.   25 tablet   3   . EXPIRED: pantoprazole (PROTONIX) 40 MG tablet   Oral   Take 1 tablet (40 mg total) by mouth daily.   30 tablet   6   . potassium chloride (KLOR-CON) 10 MEQ CR tablet   Oral   Take 10 mEq by mouth daily.           Marland Kitchen  rosuvastatin (CRESTOR) 10 MG tablet   Oral   Take 10 mg by mouth daily.         . traMADol (ULTRAM) 50 MG tablet   Oral   Take 0.5 tablets (25 mg total) by mouth every 6 (six) hours as needed for pain.   15 tablet   0   . traZODone (DESYREL) 100 MG tablet   Oral   Take 100 mg by mouth at bedtime.           Marland Kitchen zolpidem (AMBIEN) 10 MG tablet   Oral   Take 10 mg by mouth at bedtime.             BP 115/37  Pulse 73  Temp(Src) 101.3 F (38.5 C) (Oral)  Resp 18  Ht 5\' 2"  (1.575 m)  Wt 146 lb (66.225 kg)  BMI 26.7 kg/m2  SpO2 92%  Physical Exam  Nursing note and vitals reviewed. Constitutional: She is oriented to person, place, and time. She appears well-developed and well-nourished.  HENT:  Head: Normocephalic and atraumatic.  Right Ear: External ear normal.  Left Ear: External ear normal.  Mouth dry.   Eyes: Conjunctivae and EOM are normal. Pupils are equal, round, and reactive to light.  Neck: Normal range of motion. Neck supple.  Cardiovascular: Normal rate, regular rhythm and normal heart sounds.   Pulmonary/Chest: Effort normal and breath sounds normal.  Abdominal: Soft. There is no tenderness.  Musculoskeletal: Normal range of motion.  Neurological: She is alert and oriented to person, place, and time. No cranial nerve deficit. Coordination normal.  Skin: Skin is warm and dry.  Large area of cellulitis on inner aspect of left thigh. Small blister on right toe.     ED Course  CRITICAL CARE Performed by: Osvaldo Human Authorized by: Osvaldo Human Total critical care time: 30 minutes Critical care was necessary to treat or prevent imminent or life-threatening deterioration of the following conditions: Pt discovered to have rapid atrial flutter on EKG done prior to admission for cellulitis of right thigh.  Rx with IV diltiazem bolus and continuous infusion, admit to stepdown unit. Critical care was time spent personally by me on the following  activities: evaluation of patient's response to treatment, examination of patient, obtaining history from patient or surrogate, ordering and performing treatments and interventions, ordering and review of laboratory studies, ordering and review of radiographic studies, re-evaluation of patient's condition and review of old charts.   (including critical care time)  DIAGNOSTIC STUDIES: Oxygen Saturation is 92% on RA, adequate by my interpretation.    COORDINATION OF CARE: 1:13 PM-Discussed treatment plan with pt at bedside and pt agreed to plan.   Labs Reviewed  CULTURE, BLOOD (ROUTINE X 2)  CULTURE, BLOOD (ROUTINE X 2)  CBC WITH DIFFERENTIAL  COMPREHENSIVE METABOLIC PANEL  URINALYSIS, ROUTINE W REFLEX MICROSCOPIC  LACTIC ACID, PLASMA    Date: 09/06/2012  Rate: 145  Rhythm: atrial flutter  QRS Axis: normal  Intervals: QT prolonged  ST/T Wave abnormalities: nonspecific T wave changes  Conduction Disutrbances:none  Narrative Interpretation: Abnormal EKG  Old EKG Reviewed: changes noted--was in sinus rhythm on tracing done on 05/09/2011.  1:44 PM EKG showed atrial flutter with rate 145, an unexpected finding.  Will Rx with IV diltiazem.  Pt was unaware of her rapid heart rate.  Pt's [place] discussed with Dr. Kerry Hough.  Admit to [facility] unit as she is going to be on a cardizem drip.   1. Cellulitis of right thigh  2. Atrial flutter     I personally performed the services described in this documentation, which was scribed in my presence. The recorded information has been reviewed and is accurate.  Osvaldo Human, MD      Carleene Cooper III, MD 09/06/12 1346     Carleene Cooper III, MD 09/06/12 1350

## 2012-09-06 NOTE — Progress Notes (Signed)
eLink Physician-Brief Progress Note Patient Name: [name] DOB: [birth year] MRN: [id number]  Date of Service  09/06/2012   HPI/Events of Note  Hypotensive despite bolus   eICU Interventions  Neo gtt May need CVL ? Need for imaging area of cellulitis   Intervention Category Intermediate Interventions: Hypotension - evaluation and management  ALVA,RAKESH V. 09/06/2012, 9:23 PM

## 2012-09-06 NOTE — Progress Notes (Signed)
78 yr old female admitted today with cellulitis, started on vancomycin and rocephin. Patient was on Cardizem earlier for a fib which was discontinued in the ED. Now patient is hypotensive despite fluid boluses, and her BP is now in 70's, she is somnolent but arousable. E link has ordered neo synephrine, she does not have central access.  Exam: Gen: Somnolent but rousable Chest: clear bilaterally Heart : s1s2 RRR Ext: erythema noted in the right thigh, warm to touch  Assessment; Septic shock secondary to cellulitis Continue with pressor support with neo synephrine  Will get surgery for central line insertion. Will also add zosyn to broaden the coverage of the antibiotics.  I will continue with vancomycin and d/c rocephin. Will continue to monitor Called and discussed with E Link  on call Dr Vassie Loll.

## 2012-09-06 NOTE — H&P (Signed)
[facility] History and Physical  [name] Andal AVW:[id number] DOB: [birth year] DOA: 09/06/2012  Referring physician: Vanetta Richardson PCP: Heather Loffler, PA-C  Specialists: [name] cardiology  Chief Complaint: Erythema over right thigh  HPI: [name] is a 78 y.o. female presents to the emergency room with complaints of right thigh erythema. Her daughter reports that she had noted a small coin-sized area of erythema over right medial thigh approximately 5-6 days ago. Over the last few days, this is progressively gotten worse and has become a circumferential erythema over right side. She had presented to the emergency room at [facility] yesterday. She received a dose of IV Rocephin and was given a prescription for Keflex and discharged home. Her erythema was demarcated with a pen. Overnight, erythema is progressively gotten worse and began to spread further distally. She has been having fevers greater than 101. She denies any dysuria, no vomiting, she does report having a cough for the past 2 days. Denies any shortness of breath. No chest pain. She's also had some loose stools which she describes as dark green. She was evaluated in the emergency room and noted to be in acute renal failure, dehydration and atrial fibrillation without ventricular response. She was started on a Cardizem infusion, given antibiotics and referred for admission to  Review of Systems: Pertinent positives as per history of present illness, otherwise negative  Past Medical History  Diagnosis Date  . Depressive disorder, not elsewhere classified   . Anxiety state, unspecified   . Pure hypercholesterolemia   . Esophageal reflux   . Infection of esophagostomy   . Congestive heart failure, unspecified     diastolic heart failure  . Pulmonary hypertension     PA systolic pressure 55-65 mmHg by echocardiogram, PA pressure 33/10 by cardiac catheterization PA saturation 62% thermodilution cardiac index 2.0 thick  cardiac index 2.4  . Symptomatic bradycardia     on beta blocker  . Tricuspid regurgitation     moderate tricuspid regurgitation by echocardiogram, prior use of anorexic agents  . Melanoma 1995    removal on back   . Coronary artery disease     Moderate to severe coronary disease involving the left anterior descending artery and right coronary artery.  Sequential stenosis in the LAD is significant.  Right coronary artery is moderate to severely likely nonischemic.,  Questionable small LVOT obstruction.  No Brockenbrough maneuver was performed.  Catheterization January 2013  . PONV (postoperative nausea and vomiting)   . Hypertension   . Dysrhythmia   . H/O hiatal hernia   . History of percutaneous coronary intervention     Successful angioplasty and drug-eluting stent placed to an 80% lesion in the proximal LAD.,  Ostial lesion in the LAD was estimated to be 60% with an FFR ratio of 0.83, medical therapy was recommended for the latter lesion.  Dual antiplatelet therapy for one year.  Procedure performed May 08, 2011   Past Surgical History  Procedure Laterality Date  . Hiatal hernia repair  1993  . Rotator cuff repair  ~ 2001    right  . Coronary angioplasty with stent placement  05/08/11    "1"  . Tonsillectomy  ~ 1944  . Shoulder arthroscopy  ~ 2004; 2005    left; "joint's wore out"  . Cataract extraction w/ intraocular lens  implant, bilateral  ~ 2002  . Tubal ligation  1972   Social History:  reports that she quit smoking about 24 years ago. Her  smoking use included Cigarettes. She has a 20 pack-year smoking history. She has never used smokeless tobacco. She reports that she does not drink alcohol or use illicit drugs.   Allergies  Allergen Reactions  . Sulfamethoxazole     REACTION: rash    Family History  Problem Relation Age of Onset  . Heart attack Father     Prior to Admission medications   Medication Sig Start Date End Date Taking? Authorizing Provider   albuterol (PROAIR HFA) 108 (90 BASE) MCG/ACT inhaler Inhale 2 puffs into the lungs every 6 (six) hours as needed.   Yes Historical Provider, MD  ALPRAZolam (XANAX) 0.25 MG tablet 0.25 mg 3 (three) times daily as needed for anxiety. Take 1/2 to 1 by mouth three times daily   Yes Historical Provider, MD  carvedilol (COREG) 3.125 MG tablet Take 1 tablet (3.125 mg total) by mouth 2 (two) times daily with a meal. 10/28/11 10/27/12 Yes Heather Leap, MD  cephALEXin (KEFLEX) 500 MG capsule Take 1 capsule (500 mg total) by mouth 3 (three) times daily. 09/05/12  Yes Heather Kras, MD  citalopram (CELEXA) 40 MG tablet Take 40 mg by mouth daily.    Yes Historical Provider, MD  esomeprazole (NEXIUM) 40 MG capsule Take 40 mg by mouth daily before breakfast.   Yes Historical Provider, MD  ferrous sulfate 325 (65 FE) MG EC tablet Take 325 mg by mouth daily with breakfast.     Yes Historical Provider, MD  furosemide (LASIX) 40 MG tablet Take 40 mg by mouth daily.   Yes Historical Provider, MD  losartan (COZAAR) 100 MG tablet Take 100 mg by mouth daily.     Yes Historical Provider, MD  meloxicam (MOBIC) 7.5 MG tablet Take 7.5 mg by mouth daily.     Yes Historical Provider, MD  Multiple Vitamin (MULITIVITAMIN WITH MINERALS) TABS Take 1 tablet by mouth daily. Centrum silver (equate)    Yes Historical Provider, MD  potassium chloride (KLOR-CON) 10 MEQ CR tablet Take 10 mEq by mouth daily.     Yes Historical Provider, MD  traMADol (ULTRAM) 50 MG tablet Take 0.5 tablets (25 mg total) by mouth every 6 (six) hours as needed for pain. 09/05/12  Yes Heather Kras, MD  Vitamin D, Ergocalciferol, (DRISDOL) 50000 UNITS CAPS Take 50,000 Units by mouth every 7 (seven) days. Patient takes on Tuesdays   Yes Historical Provider, MD  alendronate (FOSAMAX) 70 MG tablet Take 70 mg by mouth every 7 (seven) days. Take with a full glass of water on an empty stomach on Mondays    Historical Provider, MD  aspirin 81 MG chewable tablet Chew 81 mg by  mouth every evening.    Historical Provider, MD  nitroGLYCERIN (NITROSTAT) 0.4 MG SL tablet Place 1 tablet (0.4 mg total) under the tongue every 5 (five) minutes as needed for chest pain. 04/29/11 09/05/12  Heather Leap, MD  rosuvastatin (CRESTOR) 10 MG tablet Take 10 mg by mouth at bedtime.     Historical Provider, MD  traZODone (DESYREL) 100 MG tablet Take 100 mg by mouth at bedtime.      Historical Provider, MD  zolpidem (AMBIEN) 10 MG tablet Take 10 mg by mouth at bedtime.      Historical Provider, MD   Physical Exam: Filed Vitals:   09/06/12 1207 09/06/12 1317 09/06/12 1413 09/06/12 1417  BP: 115/37 123/95 99/52 82/53   Pulse: 73 56 133 104  Temp: 101.3 F (38.5 C) 99.5 F (  37.5 C)    TempSrc:  Oral    Resp: 18 18 20 20   Height:      Weight:      SpO2: 92% 94% 94% 94%     General:  No acute distress, alert and oriented x3  Eyes: Pupils are equal round react to light  ENT: Mucous membranes are very dry  Neck: Supple  Cardiovascular: S1, S2, regular rate and rhythm  Respiratory: Clear to auscultation bilaterally  Abdomen: Soft, nontender, nondistended, bowel sounds are active  Skin: Large area of worsening circumferential erythema over right thigh. Erythema has a past previous margins that were demarcated with pen.  Musculoskeletal: No pedal edema bilaterally  Psychiatric: Normal affect, cooperative with exam  Neurologic: Grossly intact, nonfocal  Labs on Admission:  Basic Metabolic Panel:  Recent Labs Lab 09/05/12 1657 09/06/12 1337  NA 129* 127*  K 3.5 3.0*  CL 93* 90*  CO2 24 22  GLUCOSE 103* 108*  BUN 25* 39*  CREATININE 1.99* 1.93*  CALCIUM 8.5 8.3*   Liver Function Tests:  Recent Labs Lab 09/06/12 1337  AST 25  ALT 9  ALKPHOS 47  BILITOT 0.6  PROT 6.3  ALBUMIN 2.9*   No results found for this basename: LIPASE, AMYLASE,  in the last 168 hours No results found for this basename: AMMONIA,  in the last 168 hours CBC:  Recent Labs Lab  09/05/12 1657 09/06/12 1337  WBC 10.7* 8.8  NEUTROABS 9.1* 8.0*  HGB 11.0* 12.1  HCT 33.8* 35.5*  MCV 80.7 80.1  PLT 88* 88*   Cardiac Enzymes: No results found for this basename: CKTOTAL, CKMB, CKMBINDEX, TROPONINI,  in the last 168 hours  BNP (last 3 results) No results found for this basename: PROBNP,  in the last 8760 hours CBG: No results found for this basename: GLUCAP,  in the last 168 hours  Radiological Exams on Admission: Dg Chest 1 View  09/06/2012   *RADIOLOGY REPORT*  Clinical Data: Right thigh cellulitis  CHEST - 1 VIEW  Comparison: Prior chest x-Malva 03/02/2005  Findings: The lungs are clear.  Stable cardiomegaly.  Moderately large hiatal hernia.  Central bronchitic changes are stable and unchanged.  Surgical clips again noted in the mid epigastric region.  No acute osseous abnormality.  IMPRESSION: No acute cardiopulmonary disease. Stable chest x-Casy.   Original Report Authenticated By: Malachy Moan, M.D.    EKG: Independently reviewed. Rapid atrial fibrillation  Assessment/Plan Active Problems:   Coronary artery disease   Atrial fibrillation with RVR   Cellulitis of extremity   Acute renal failure   Hypokalemia   Hyponatremia   Thrombocytopenia, unspecified   Sepsis   1. Sepsis secondary to cellulitis of right thigh. Will start the patient on IV vancomycin, continue Rocephin. Blood cultures have been sent. Urine cultures have also been sent. Will monitor for any recurrence of fevers. Follow cellulitis clinically. Keep lower extremity elevated. Check lactic acid. 2. Atrial fibrillation with a ventricular response. Patient was started on diltiazem infusion. She does not have a chronic history of atrial fibrillation. It is likely that her arrhythmia is secondary to ongoing sepsis and volume depletion. Since she's been started on diltiazem, she has converted to sinus rhythm. We will discontinue diltiazem for now since her she is mildly hypotensive. Continue to  monitor the step down unit. We'll check echocardiogram, TSH and cardiac markers. 3. Hyponatremia. Likely secondary to volume depletion. Continue his saline infusion. 4. Acute renal failure in the setting of hypovolemia and ARB.  We will hold nephrotoxic agents at this time. Continue IV fluids and monitor urine output. 5. Thrombocytopenia. Likely secondary to underlying infection. Will hold off on any heparin products for DVT prophylaxis and use SCDs. Continue to follow serial CBCs. No evidence of bleeding at this time. 6. Hypokalemia. Replace, check magnesium   Code Status: full code Family Communication: discussed with patient and daughter at the bedside Disposition Plan: discharge home once improved  Time spent:  Izaya Netherton [facility] Pager [phone]  If 7PM-7AM, please contact night-coverage [email or web address] Password [email or web address] 09/06/2012, 3:50 PM

## 2012-09-06 NOTE — Progress Notes (Addendum)
ANTIBIOTIC CONSULT NOTE  Pharmacy Consult for Vancomycin Indication: cellulitis  Allergies  Allergen Reactions  . Sulfamethoxazole     REACTION: rash    Patient Measurements: Height: 5\' 2"  (157.5 cm) Weight: 146 lb 13.2 oz (66.6 kg) IBW/kg (Calculated) : 50.1  Vital Signs: Temp: 99.1 F (37.3 C) (06/08 1553) Temp src: Oral (06/08 1553) BP: 83/42 mmHg (06/08 1553) Pulse Rate: 108 (06/08 1553) Intake/Output from previous day:   Intake/Output from this shift: Total I/O In: 200 [IV Piggyback:200] Out: -   Labs:  Recent Labs  09/05/12 1657 09/06/12 1337  WBC 10.7* 8.8  HGB 11.0* 12.1  PLT 88* 88*  CREATININE 1.99* 1.93*   Estimated Creatinine Clearance: 22.5 ml/min (by C-G formula based on Cr of 1.93). No results found for this basename: VANCOTROUGH, Leodis Binet, VANCORANDOM, GENTTROUGH, GENTPEAK, GENTRANDOM, TOBRATROUGH, TOBRAPEAK, TOBRARND, AMIKACINPEAK, AMIKACINTROU, AMIKACIN,  in the last 72 hours   Microbiology: Recent Results (from the past 720 hour(s))  CULTURE, BLOOD (ROUTINE X 2)     Status: None   Collection Time    09/06/12  1:38 PM      Result Value Range Status   Specimen Description RIGHT ANTECUBITAL   Final   Special Requests BOTTLES DRAWN AEROBIC AND ANAEROBIC 6CC   Final   Culture PENDING   Incomplete   Report Status PENDING   Incomplete  CULTURE, BLOOD (ROUTINE X 2)     Status: None   Collection Time    09/06/12  1:44 PM      Result Value Range Status   Specimen Description BLOOD RIGHT HAND   Final   Special Requests BOTTLES DRAWN AEROBIC ONLY 6CC   Final   Culture PENDING   Incomplete   Report Status PENDING   Incomplete    Medical History: Past Medical History  Diagnosis Date  . Depressive disorder, not elsewhere classified   . Anxiety state, unspecified   . Pure hypercholesterolemia   . Esophageal reflux   . Infection of esophagostomy   . Congestive heart failure, unspecified     diastolic heart failure  . Pulmonary  hypertension     PA systolic pressure 55-65 mmHg by echocardiogram, PA pressure 33/10 by cardiac catheterization PA saturation 62% thermodilution cardiac index 2.0 thick cardiac index 2.4  . Symptomatic bradycardia     on beta blocker  . Tricuspid regurgitation     moderate tricuspid regurgitation by echocardiogram, prior use of anorexic agents  . Melanoma 1995    removal on back   . Coronary artery disease     Moderate to severe coronary disease involving the left anterior descending artery and right coronary artery.  Sequential stenosis in the LAD is significant.  Right coronary artery is moderate to severely likely nonischemic.,  Questionable small LVOT obstruction.  No Brockenbrough maneuver was performed.  Catheterization January 2013  . PONV (postoperative nausea and vomiting)   . Hypertension   . Dysrhythmia   . H/O hiatal hernia   . History of percutaneous coronary intervention     Successful angioplasty and drug-eluting stent placed to an 80% lesion in the proximal LAD.,  Ostial lesion in the LAD was estimated to be 60% with an FFR ratio of 0.83, medical therapy was recommended for the latter lesion.  Dual antiplatelet therapy for one year.  Procedure performed May 08, 2011   Medications:  Scheduled:  . sodium chloride   Intravenous STAT  . aspirin  81 mg Oral QPM  . atorvastatin  20 mg Oral q1800  .  cefTRIAXone (ROCEPHIN)  IV  1 g Intravenous Q24H  . citalopram  20 mg Oral Daily  . [START ON 09/07/2012] ferrous sulfate  325 mg Oral Q breakfast  . pantoprazole  40 mg Oral Daily  . potassium chloride  40 mEq Oral Q3H  . traZODone  100 mg Oral QHS  . zolpidem  5 mg Oral QHS   Assessment: Okay for Protocol Estimated Creatinine Clearance: 22.5 ml/min (by C-G formula based on Cr of 1.93). Patient received 1000mg  Vancomycin earlier today in ED.  Goal of Therapy:  Vancomycin trough level 10-15 mcg/ml  Plan:  Vancomycin 750mg  IV every 24 hours. Measure antibiotic drug  levels at steady state Follow up culture results  Zosyn 3.375gm IV every 8 hours. Follow-up micro data, labs, vitals.   Mady Gemma 09/06/2012,4:03 PM

## 2012-09-06 NOTE — ED Notes (Signed)
Per Dr. Jacques Navy request - patient placed on cardiac monitor.

## 2012-09-07 ENCOUNTER — Inpatient Hospital Stay (HOSPITAL_COMMUNITY): Payer: Medicare Other

## 2012-09-07 DIAGNOSIS — I369 Nonrheumatic tricuspid valve disorder, unspecified: Secondary | ICD-10-CM

## 2012-09-07 LAB — CBC
HCT: 32.2 % — ABNORMAL LOW (ref 36.0–46.0)
MCV: 80.9 fL (ref 78.0–100.0)
Platelets: 97 10*3/uL — ABNORMAL LOW (ref 150–400)
RBC: 3.98 MIL/uL (ref 3.87–5.11)
RDW: 14 % (ref 11.5–15.5)
WBC: 14.2 10*3/uL — ABNORMAL HIGH (ref 4.0–10.5)

## 2012-09-07 LAB — BASIC METABOLIC PANEL
BUN: 42 mg/dL — ABNORMAL HIGH (ref 6–23)
CO2: 20 mEq/L (ref 19–32)
Chloride: 98 mEq/L (ref 96–112)
Creatinine, Ser: 1.96 mg/dL — ABNORMAL HIGH (ref 0.50–1.10)
GFR calc Af Amer: 28 mL/min — ABNORMAL LOW (ref >90)
Potassium: 4.5 mEq/L (ref 3.5–5.1)

## 2012-09-07 LAB — URINE CULTURE: Culture: NO GROWTH

## 2012-09-07 LAB — COMPREHENSIVE METABOLIC PANEL
Albumin: 2.1 g/dL — ABNORMAL LOW (ref 3.5–5.2)
BUN: 40 mg/dL — ABNORMAL HIGH (ref 6–23)
Creatinine, Ser: 1.8 mg/dL — ABNORMAL HIGH (ref 0.50–1.10)
GFR calc Af Amer: 31 mL/min — ABNORMAL LOW (ref >90)
Glucose, Bld: 160 mg/dL — ABNORMAL HIGH (ref 70–99)
Total Protein: 5.2 g/dL — ABNORMAL LOW (ref 6.0–8.3)

## 2012-09-07 LAB — TROPONIN I
Troponin I: <0.3 ng/mL (ref <0.30)
Troponin I: <0.3 ng/mL (ref <0.30)

## 2012-09-07 MED ORDER — PHENYLEPHRINE HCL 10 MG/ML IJ SOLN
INTRAMUSCULAR | Status: AC
  Filled 2012-09-07: qty 2

## 2012-09-07 MED ORDER — SODIUM CHLORIDE 0.9 % IV SOLN
INTRAVENOUS | Status: DC
  Administered 2012-09-07 – 2012-09-09 (×4): via INTRAVENOUS

## 2012-09-07 NOTE — Progress Notes (Signed)
UR Chart Review Completed  

## 2012-09-07 NOTE — Progress Notes (Signed)
*  PRELIMINARY RESULTS* Echocardiogram 2D Echocardiogram has been performed.  Conrad [place] 09/07/2012, 11:36 AM

## 2012-09-07 NOTE — Progress Notes (Signed)
[facility] PROGRESS NOTE  [name] UJW:[id number] DOB: [birth year] DOA: 09/06/2012 PCP: Heather Loffler, PA-C  Assessment/Plan: Septic shock. Patient has central in place overnight and started on vasopressors. Continue fluids and antibiotics. Wean down pressors as tolerated. Followup cultures.  cellulitis of right thigh. We'll obtain CT of right lower shoulder region rule out any underlying abscess. Continue current antibiotics.  Atrial Fibrillation with rapid ventricular response. Patient was initially started on Cardizem infusion the emergency room. She has since converted to sinus rhythm and Cardizem as been discontinued. Echocardiogram is pending. Cardiac markers are negative. TSH normal  Hyponatremia due to depletion. Improved with IV fluids.  Thrombocytopenia. It is secondary to infection. Improving. Continue to follow.  Acute renal failure, likely secondary to volume depletion as well as ARB. Continue IV fluids and follow urine output.   Code Status: full code Family Communication: discussed with patient Disposition Plan: discharge home once improved   Consultants:  Gen surgery for central line placement  Procedures: Central line placement  By Dr. Lovell Sheehan 6/8  Antibiotics:  Vancomycin 6/8  Rocephin 6/8 - 6/9  Zosyn 6/9  HPI/Subjective: Feels a little better today, no shortness of breath, right leg is sore  Objective: Filed Vitals:   09/07/12 0745 09/07/12 0800 09/07/12 0815 09/07/12 0830  BP: 127/45 119/44 126/53 105/86  Pulse: 51 50 59 56  Temp:    98.2 F (36.8 C)  TempSrc:    Axillary  Resp: 22 21 20 22   Height:      Weight:      SpO2: 100% 100% 100% 100%    Intake/Output Summary (Last 24 hours) at 09/07/12 0850 Last data filed at 09/07/12 0630  Gross per 24 hour  Intake 3023.95 ml  Output    700 ml  Net 2323.95 ml   Filed Weights   09/06/12 1020 09/06/12 1553 09/07/12 0500  Weight: 66.225 kg (146 lb) 66.6 kg (146 lb 13.2 oz) 74.3 kg  (163 lb 12.8 oz)    Exam:   General:  NAD  Cardiovascular: S1, S2 RRR  Respiratory: CTA B  Abdomen: soft, nt, nd, bs+  Musculoskeletal: continued erythema of right thigh   Data Reviewed: Basic Metabolic Panel:  Recent Labs Lab 09/05/12 1657 09/06/12 1337 09/06/12 1605 09/07/12 0104 09/07/12 0459  NA 129* 127*  --  129* 134*  K 3.5 3.0*  --  4.5 4.6  CL 93* 90*  --  98 103  CO2 24 22  --  20 20  GLUCOSE 103* 108*  --  193* 160*  BUN 25* 39*  --  42* 40*  CREATININE 1.99* 1.93*  --  1.96* 1.80*  CALCIUM 8.5 8.3*  --  7.0* 7.0*  MG  --   --  1.8  --   --    Liver Function Tests:  Recent Labs Lab 09/06/12 1337 09/07/12 0459  AST 25 45*  ALT 9 23  ALKPHOS 47 79  BILITOT 0.6 0.3  PROT 6.3 5.2*  ALBUMIN 2.9* 2.1*   No results found for this basename: LIPASE, AMYLASE,  in the last 168 hours No results found for this basename: AMMONIA,  in the last 168 hours CBC:  Recent Labs Lab 09/05/12 1657 09/06/12 1337 09/07/12 0459  WBC 10.7* 8.8 14.2*  NEUTROABS 9.1* 8.0*  --   HGB 11.0* 12.1 11.0*  HCT 33.8* 35.5* 32.2*  MCV 80.7 80.1 80.9  PLT 88* 88* 97*   Cardiac Enzymes:  Recent Labs Lab 09/06/12 1605 09/07/12 0104  09/07/12 0637  TROPONINI <0.30 <0.30 <0.30   BNP (last 3 results) No results found for this basename: PROBNP,  in the last 8760 hours CBG: No results found for this basename: GLUCAP,  in the last 168 hours  Recent Results (from the past 240 hour(s))  CULTURE, BLOOD (ROUTINE X 2)     Status: None   Collection Time    09/06/12  1:38 PM      Result Value Range Status   Specimen Description RIGHT ANTECUBITAL   Final   Special Requests BOTTLES DRAWN AEROBIC AND ANAEROBIC 6CC   Final   Culture PENDING   Incomplete   Report Status PENDING   Incomplete  CULTURE, BLOOD (ROUTINE X 2)     Status: None   Collection Time    09/06/12  1:44 PM      Result Value Range Status   Specimen Description BLOOD RIGHT HAND   Final   Special Requests  BOTTLES DRAWN AEROBIC ONLY 6CC   Final   Culture PENDING   Incomplete   Report Status PENDING   Incomplete  MRSA PCR SCREENING     Status: None   Collection Time    09/06/12  4:00 PM      Result Value Range Status   MRSA by PCR NEGATIVE  NEGATIVE Final   Comment:            The GeneXpert MRSA Assay (FDA     approved for NASAL specimens     only), is one component of a     comprehensive MRSA colonization     surveillance program. It is not     intended to diagnose MRSA     infection nor to guide or     monitor treatment for     MRSA infections.     Studies: Dg Chest 1 View  09/06/2012   *RADIOLOGY REPORT*  Clinical Data: Right thigh cellulitis  CHEST - 1 VIEW  Comparison: Prior chest x-[name] 03/02/2005  Findings: The lungs are clear.  Stable cardiomegaly.  Moderately large hiatal hernia.  Central bronchitic changes are stable and unchanged.  Surgical clips again noted in the mid epigastric region.  No acute osseous abnormality.  IMPRESSION: No acute cardiopulmonary disease. Stable chest x-[name].   Original Report Authenticated By: Malachy Moan, M.D.   Dg Chest Port 1 View  09/06/2012   *RADIOLOGY REPORT*  Clinical Data: New left central venous line placement.  PORTABLE CHEST - 1 VIEW  Comparison: 09/06/2012 at 1400 hours.  Findings: Interval placement of a left subclavian central venous catheter with tip overlying the mid SVC region.  No pneumothorax. Shallow inspiration.  Mild cardiac enlargement with normal pulmonary vascularity.  No pleural effusion or consolidation. Surgical clips in the left upper quadrant.  Previous resection or resorption of the distal right clavicle.  IMPRESSION: Left central venous catheter placed with tip over the mid SVC region.  No pneumothorax.  Otherwise stable appearance of the chest.   Original Report Authenticated By: Burman Nieves, M.D.    Scheduled Meds: . aspirin  81 mg Oral QPM  . atorvastatin  20 mg Oral q1800  . citalopram  20 mg Oral Daily  .  ferrous sulfate  325 mg Oral Q breakfast  . pantoprazole  40 mg Oral Daily  . piperacillin-tazobactam (ZOSYN)  IV  3.375 g Intravenous Q8H  . sodium chloride  10-40 mL Intracatheter Q12H  . traZODone  100 mg Oral QHS  . vancomycin  750 mg Intravenous Q24H  .  zolpidem  5 mg Oral QHS   Continuous Infusions: . 0.9 % NaCl with KCl 20 mEq / L 125 mL/hr at 09/07/12 0127  . phenylephrine (NEO-SYNEPHRINE) Adult infusion 75 mcg/min (09/07/12 0630)    Active Problems:   Coronary artery disease   Atrial fibrillation with RVR   Cellulitis of extremity   Acute renal failure   Hypokalemia   Hyponatremia   Thrombocytopenia, unspecified   Sepsis    Time spent: critical care:    MEMON,JEHANZEB  [facility] Pager [phone]. If 7PM-7AM, please contact night-coverage at [email or web address], password [facility] 09/07/2012, 8:50 AM  LOS: 1 day

## 2012-09-07 NOTE — Care Management Note (Signed)
    Page 1 of 2   09/11/2012     1:29:21 PM   CARE MANAGEMENT NOTE 09/11/2012  Patient:  [name], [name]   Account Number:  [id number]  Date Initiated:  09/07/2012  Documentation initiated by:  Anibal Henderson  Subjective/Objective Assessment:   Pt admitted to ICU with sepsis, and cellulitis. Also had a fib without RVR. on IV antibiotics and IV Cardizem. Patient is from home, lives alone, and is completely independent. She has family available to assist as needed.     Action/Plan:   patient plans to return home at discharge. No CM needs identified at this time. Will continue to follow   Anticipated DC Date:  09/09/2012   Anticipated DC Plan:  HOME/SELF CARE      DC Planning Services  CM consult      [facility] Choice  HOME HEALTH   Choice offered to / List presented to:  C-1 Patient        HH arranged  HH-1 RN  HH-4 NURSE'S AIDE      HH agency  [facility]   Status of service:  Completed, signed off Medicare Important Message given?  YES (If response is "NO", the following Medicare IM given date fields will be blank) Date Medicare IM given:  09/11/2012 Date Additional Medicare IM given:    Discharge Disposition:  HOME W HOME HEALTH SERVICES  Per UR Regulation:  Reviewed for med. necessity/level of care/duration of stay  If discussed at Long Length of Stay Meetings, dates discussed:    Comments:  09/11/12 1327 Arlyss Queen, RN BSN CM Pt to be discharged over the weekend. Pt would like HH RN and aide with [facility]. Alroy Bailiff of [facility] is aware and will collect the pts information from the chart. No DME needs noted. HH services to start within 48 hours of discharge. Pt and pts nurse aware of discharge arrangements.  09/07/12 1100 Anibal Henderson RN/CM

## 2012-09-07 NOTE — Progress Notes (Addendum)
Spoke with Dr. Kerry Hough regarding patients BP being stable x 1.5hrs after neo gtt stopped. No complaints per patient. MD stated OK for patient to come off telemetry for CT scan. CT informed and will send for patient when spot available.   CT inquired about patient receiving MRI vs CT. Asked Dr. Kerry Hough which he preferred. MD to change order from CT to MRI.

## 2012-09-08 ENCOUNTER — Inpatient Hospital Stay (HOSPITAL_COMMUNITY): Payer: Medicare Other

## 2012-09-08 LAB — CBC
HCT: 29.2 % — ABNORMAL LOW (ref 36.0–46.0)
MCV: 80.7 fL (ref 78.0–100.0)
Platelets: 98 10*3/uL — ABNORMAL LOW (ref 150–400)
RBC: 3.62 MIL/uL — ABNORMAL LOW (ref 3.87–5.11)
RDW: 14.6 % (ref 11.5–15.5)
WBC: 11.6 10*3/uL — ABNORMAL HIGH (ref 4.0–10.5)

## 2012-09-08 LAB — BASIC METABOLIC PANEL
CO2: 18 mEq/L — ABNORMAL LOW (ref 19–32)
Chloride: 107 mEq/L (ref 96–112)
Creatinine, Ser: 1.26 mg/dL — ABNORMAL HIGH (ref 0.50–1.10)
GFR calc Af Amer: 47 mL/min — ABNORMAL LOW (ref >90)
Potassium: 4 mEq/L (ref 3.5–5.1)

## 2012-09-08 NOTE — Progress Notes (Signed)
Pt came up from ICU. Pt is alert and oriented and in NAD. Will continue to monitor.

## 2012-09-08 NOTE — Progress Notes (Signed)
PT TRANSFERING TO ROOM 318. PT ALERT AND ORIENTED. HR 67 IN NSL.LUNG SOUNDS ARE CLEAR. O2 SAT 97% ON ROOM AIR. NSL PATENT. TRANSFER REPORT CALLED TO SARA RN ON 300.

## 2012-09-08 NOTE — Progress Notes (Addendum)
[facility] PROGRESS NOTE  [name] ZOX:[id number] DOB: 08-21-1934 DOA: 09/06/2012 PCP: Remus Loffler, PA-C  Assessment/Plan: Septic shock. Continue broad-spectrum antibiotics. Cultures have not shown any growth to date. Patient has been weaned off of vasopressors. Continue current management.  cellulitis of right thigh. Erythema still presents and appears to be extending down right side. MRI of the lower extremity to rule out any deep tissue infection is pending. Continue current antibiotics  Atrial Fibrillation with rapid ventricular response. Patient was initially started on Cardizem infusion the emergency room. She has since converted to sinus rhythm and Cardizem was discontinued. Echocardiogram is pending. Cardiac markers are negative. TSH normal  Hyponatremia due to depletion. Improved with IV fluids.  Thrombocytopenia. It is secondary to infection. Improving. Continue to follow.  Acute renal failure, likely secondary to volume depletion as well as ARB. Creatinine has improved with IV fluids.    Code Status: full code Family Communication: discussed with patient Disposition Plan: discharge home once improved, transfer to telemetry   Consultants:  Gen surgery for central line placement  Procedures: Central line placement  By Dr. Lovell Sheehan 6/8  Antibiotics:  Vancomycin 6/8  Rocephin 6/8 - 6/9  Zosyn 6/9  HPI/Subjective: Denies any shortness of breath or chest pain. She is feeling better today. Her right leg is still sore, but she feels that it may be improving.  Objective: Filed Vitals:   09/08/12 0300 09/08/12 0400 09/08/12 0500 09/08/12 0600  BP: 119/42 116/49 121/45 114/63  Pulse: 65 65 73 70  Temp:  98.5 F (36.9 C)    TempSrc:  Oral    Resp: 27 24 26 24   Height:      Weight:   75.2 kg (165 lb 12.6 oz)   SpO2: 95% 95% 96% 96%    Intake/Output Summary (Last 24 hours) at 09/08/12 0828 Last data filed at 09/08/12 0600  Gross per 24 hour  Intake  3665.99 ml  Output    725 ml  Net 2940.99 ml   Filed Weights   09/06/12 1553 09/07/12 0500 09/08/12 0500  Weight: 66.6 kg (146 lb 13.2 oz) 74.3 kg (163 lb 12.8 oz) 75.2 kg (165 lb 12.6 oz)    Exam:   General:  NAD  Cardiovascular: S1, S2 RRR  Respiratory: CTA B  Abdomen: soft, nt, nd, bs+  Musculoskeletal: Erythema appears to be continuing to extend down the right thigh distally.  Data Reviewed: Basic Metabolic Panel:  Recent Labs Lab 09/05/12 1657 09/06/12 1337 09/06/12 1605 09/07/12 0104 09/07/12 0459 09/08/12 0552  NA 129* 127*  --  129* 134* 135  K 3.5 3.0*  --  4.5 4.6 4.0  CL 93* 90*  --  98 103 107  CO2 24 22  --  20 20 18*  GLUCOSE 103* 108*  --  193* 160* 104*  BUN 25* 39*  --  42* 40* 24*  CREATININE 1.99* 1.93*  --  1.96* 1.80* 1.26*  CALCIUM 8.5 8.3*  --  7.0* 7.0* 7.0*  MG  --   --  1.8  --   --   --    Liver Function Tests:  Recent Labs Lab 09/06/12 1337 09/07/12 0459  AST 25 45*  ALT 9 23  ALKPHOS 47 79  BILITOT 0.6 0.3  PROT 6.3 5.2*  ALBUMIN 2.9* 2.1*   No results found for this basename: LIPASE, AMYLASE,  in the last 168 hours No results found for this basename: AMMONIA,  in the last 168 hours CBC:  Recent Labs Lab 09/05/12 1657 09/06/12 1337 09/07/12 0459 09/08/12 0552  WBC 10.7* 8.8 14.2* 11.6*  NEUTROABS 9.1* 8.0*  --   --   HGB 11.0* 12.1 11.0* 9.9*  HCT 33.8* 35.5* 32.2* 29.2*  MCV 80.7 80.1 80.9 80.7  PLT 88* 88* 97* 98*   Cardiac Enzymes:  Recent Labs Lab 09/06/12 1605 09/07/12 0104 09/07/12 0637  TROPONINI <0.30 <0.30 <0.30   BNP (last 3 results) No results found for this basename: PROBNP,  in the last 8760 hours CBG: No results found for this basename: GLUCAP,  in the last 168 hours  Recent Results (from the past 240 hour(s))  CULTURE, BLOOD (ROUTINE X 2)     Status: None   Collection Time    09/06/12  1:38 PM      Result Value Range Status   Specimen Description RIGHT ANTECUBITAL   Final   Special  Requests BOTTLES DRAWN AEROBIC AND ANAEROBIC 6CC   Final   Culture NO GROWTH 1 DAY   Final   Report Status PENDING   Incomplete  CULTURE, BLOOD (ROUTINE X 2)     Status: None   Collection Time    09/06/12  1:44 PM      Result Value Range Status   Specimen Description BLOOD RIGHT HAND   Final   Special Requests BOTTLES DRAWN AEROBIC ONLY 6CC   Final   Culture NO GROWTH 1 DAY   Final   Report Status PENDING   Incomplete  URINE CULTURE     Status: None   Collection Time    09/06/12  1:57 PM      Result Value Range Status   Specimen Description URINE, CLEAN CATCH   Final   Special Requests NONE   Final   Culture  Setup Time 09/06/2012 21:03   Final   Colony Count NO GROWTH   Final   Culture NO GROWTH   Final   Report Status 09/07/2012 FINAL   Final  MRSA PCR SCREENING     Status: None   Collection Time    09/06/12  4:00 PM      Result Value Range Status   MRSA by PCR NEGATIVE  NEGATIVE Final   Comment:            The GeneXpert MRSA Assay (FDA     approved for NASAL specimens     only), is one component of a     comprehensive MRSA colonization     surveillance program. It is not     intended to diagnose MRSA     infection nor to guide or     monitor treatment for     MRSA infections.     Studies: Dg Chest 1 View  09/06/2012   *RADIOLOGY REPORT*  Clinical Data: Right thigh cellulitis  CHEST - 1 VIEW  Comparison: Prior chest x-[place] 03/02/2005  Findings: The lungs are clear.  Stable cardiomegaly.  Moderately large hiatal hernia.  Central bronchitic changes are stable and unchanged.  Surgical clips again noted in the mid epigastric region.  No acute osseous abnormality.  IMPRESSION: No acute cardiopulmonary disease. Stable chest x-[place].   Original Report Authenticated By: Malachy Moan, M.D.   Dg Chest Port 1 View  09/06/2012   *RADIOLOGY REPORT*  Clinical Data: New left central venous line placement.  PORTABLE CHEST - 1 VIEW  Comparison: 09/06/2012 at 1400 hours.  Findings: Interval  placement of a left subclavian central venous catheter with tip overlying the mid  SVC region.  No pneumothorax. Shallow inspiration.  Mild cardiac enlargement with normal pulmonary vascularity.  No pleural effusion or consolidation. Surgical clips in the left upper quadrant.  Previous resection or resorption of the distal right clavicle.  IMPRESSION: Left central venous catheter placed with tip over the mid SVC region.  No pneumothorax.  Otherwise stable appearance of the chest.   Original Report Authenticated By: Burman Nieves, M.D.    Scheduled Meds: . aspirin  81 mg Oral QPM  . atorvastatin  20 mg Oral q1800  . citalopram  20 mg Oral Daily  . ferrous sulfate  325 mg Oral Q breakfast  . pantoprazole  40 mg Oral Daily  . piperacillin-tazobactam (ZOSYN)  IV  3.375 g Intravenous Q8H  . sodium chloride  10-40 mL Intracatheter Q12H  . traZODone  100 mg Oral QHS  . vancomycin  750 mg Intravenous Q24H  . zolpidem  5 mg Oral QHS   Continuous Infusions: . sodium chloride 125 mL/hr at 09/08/12 0600  . phenylephrine (NEO-SYNEPHRINE) Adult infusion Stopped (09/07/12 1308)    Active Problems:   Coronary artery disease   Atrial fibrillation with RVR   Cellulitis of extremity   Acute renal failure   Hypokalemia   Hyponatremia   Thrombocytopenia, unspecified   Sepsis    Time spent:    Deronte Solis  [facility] Pager [phone]. If 7PM-7AM, please contact night-coverage at [email or web address], password [facility] 09/08/2012, 8:28 AM  LOS: 2 days

## 2012-09-09 LAB — CBC
HCT: 27.9 % — ABNORMAL LOW (ref 36.0–46.0)
Hemoglobin: 9.4 g/dL — ABNORMAL LOW (ref 12.0–15.0)
MCH: 26.9 pg (ref 26.0–34.0)
MCV: 79.9 fL (ref 78.0–100.0)
Platelets: 102 10*3/uL — ABNORMAL LOW (ref 150–400)
RBC: 3.49 MIL/uL — ABNORMAL LOW (ref 3.87–5.11)
WBC: 12.1 10*3/uL — ABNORMAL HIGH (ref 4.0–10.5)

## 2012-09-09 LAB — BASIC METABOLIC PANEL
CO2: 19 mEq/L (ref 19–32)
Chloride: 108 mEq/L (ref 96–112)
Glucose, Bld: 101 mg/dL — ABNORMAL HIGH (ref 70–99)
Potassium: 3.8 mEq/L (ref 3.5–5.1)
Sodium: 138 mEq/L (ref 135–145)

## 2012-09-09 NOTE — Progress Notes (Signed)
ANTIBIOTIC CONSULT NOTE  Pharmacy Consult for Vancomycin Indication: cellulitis  Allergies  Allergen Reactions  . Sulfamethoxazole     REACTION: rash   Patient Measurements: Height: 5\' 2"  (157.5 cm) Weight: 173 lb 11.6 oz (78.8 kg) IBW/kg (Calculated) : 50.1  Vital Signs: Temp: 98.6 F (37 C) (06/11 0514) Temp src: Oral (06/11 0514) BP: 127/63 mmHg (06/11 0514) Pulse Rate: 74 (06/11 0514) Intake/Output from previous day: 06/10 0701 - 06/11 0700 In: 1695 [P.O.:1320; I.V.:375] Out: 602 [Urine:602] Intake/Output from this shift:    Labs:  Recent Labs  09/07/12 0459 09/08/12 0552 09/09/12 0538  WBC 14.2* 11.6* 12.1*  HGB 11.0* 9.9* 9.4*  PLT 97* 98* 102*  CREATININE 1.80* 1.26* 1.13*   Estimated Creatinine Clearance: 41.8 ml/min (by C-G formula based on Cr of 1.13). No results found for this basename: VANCOTROUGH, Leodis Binet, VANCORANDOM, GENTTROUGH, GENTPEAK, GENTRANDOM, TOBRATROUGH, TOBRAPEAK, TOBRARND, AMIKACINPEAK, AMIKACINTROU, AMIKACIN,  in the last 72 hours   Microbiology: Recent Results (from the past 720 hour(s))  CULTURE, BLOOD (ROUTINE X 2)     Status: None   Collection Time    09/06/12  1:38 PM      Result Value Range Status   Specimen Description RIGHT ANTECUBITAL   Final   Special Requests BOTTLES DRAWN AEROBIC AND ANAEROBIC 6CC   Final   Culture NO GROWTH 2 DAYS   Final   Report Status PENDING   Incomplete  CULTURE, BLOOD (ROUTINE X 2)     Status: None   Collection Time    09/06/12  1:44 PM      Result Value Range Status   Specimen Description BLOOD RIGHT HAND   Final   Special Requests BOTTLES DRAWN AEROBIC ONLY 6CC   Final   Culture NO GROWTH 2 DAYS   Final   Report Status PENDING   Incomplete  URINE CULTURE     Status: None   Collection Time    09/06/12  1:57 PM      Result Value Range Status   Specimen Description URINE, CLEAN CATCH   Final   Special Requests NONE   Final   Culture  Setup Time 09/06/2012 21:03   Final   Colony Count NO  GROWTH   Final   Culture NO GROWTH   Final   Report Status 09/07/2012 FINAL   Final  MRSA PCR SCREENING     Status: None   Collection Time    09/06/12  4:00 PM      Result Value Range Status   MRSA by PCR NEGATIVE  NEGATIVE Final   Comment:            The GeneXpert MRSA Assay (FDA     approved for NASAL specimens     only), is one component of a     comprehensive MRSA colonization     surveillance program. It is not     intended to diagnose MRSA     infection nor to guide or     monitor treatment for     MRSA infections.   Medical History: Past Medical History  Diagnosis Date  . Depressive disorder, not elsewhere classified   . Anxiety state, unspecified   . Pure hypercholesterolemia   . Esophageal reflux   . Infection of esophagostomy   . Congestive heart failure, unspecified     diastolic heart failure  . Pulmonary hypertension     PA systolic pressure 55-65 mmHg by echocardiogram, PA pressure 33/10 by cardiac catheterization PA saturation 62%  thermodilution cardiac index 2.0 thick cardiac index 2.4  . Symptomatic bradycardia     on beta blocker  . Tricuspid regurgitation     moderate tricuspid regurgitation by echocardiogram, prior use of anorexic agents  . Melanoma 1995    removal on back   . Coronary artery disease     Moderate to severe coronary disease involving the left anterior descending artery and right coronary artery.  Sequential stenosis in the LAD is significant.  Right coronary artery is moderate to severely likely nonischemic.,  Questionable small LVOT obstruction.  No Brockenbrough maneuver was performed.  Catheterization January 2013  . PONV (postoperative nausea and vomiting)   . Hypertension   . Dysrhythmia   . H/O hiatal hernia   . History of percutaneous coronary intervention     Successful angioplasty and drug-eluting stent placed to an 80% lesion in the proximal LAD.,  Ostial lesion in the LAD was estimated to be 60% with an FFR ratio of 0.83,  medical therapy was recommended for the latter lesion.  Dual antiplatelet therapy for one year.  Procedure performed May 08, 2011   Medications:  Scheduled:  . aspirin  81 mg Oral QPM  . atorvastatin  20 mg Oral q1800  . citalopram  20 mg Oral Daily  . ferrous sulfate  325 mg Oral Q breakfast  . pantoprazole  40 mg Oral Daily  . piperacillin-tazobactam (ZOSYN)  IV  3.375 g Intravenous Q8H  . sodium chloride  10-40 mL Intracatheter Q12H  . traZODone  100 mg Oral QHS  . vancomycin  750 mg Intravenous Q24H  . zolpidem  5 mg Oral QHS   Assessment: Okay for Protocol Estimated Creatinine Clearance: 41.8 ml/min (by C-G formula based on Cr of 1.13).  SCr has improved since admission.  Goal of Therapy:  Vancomycin trough level 10-15 mcg/ml  Plan:  Vancomycin 750mg  IV every 24 hours. Check trough level tomorrow (6/12) Measure antibiotic drug levels at steady state Follow up culture results  Zosyn 3.375gm IV every 8 hours. Follow-up micro data, labs, vitals.  Valrie Hart A 09/09/2012,8:47 AM

## 2012-09-09 NOTE — Progress Notes (Signed)
[facility] PROGRESS NOTE  [name] WGN:[id number] DOB: 01-09-1935 DOA: 09/06/2012 PCP: Remus Loffler, PA-C  Brief narrative :  This lady was admitted to the hospital with right thigh cellulitis. She was also noted to be significantly dehydrated, in acute renal failure, and developed atrial fibrillation with rapid ventricular response. She was admitted to the ICU for initial management. She was started on broad-spectrum antibiotics, IV fluids, and a Cardizem infusion. Her atrial fibrillation subsequently converted to sinus rhythm and she has maintained sinus rhythm since then. Unfortunately, she did develop some hypotension and in the setting of sepsis, central line was placed for vasopressors. Patient was briefly on vasopressors and was able to come off of them the following day. Hemodynamically she has since been stable. She is continued on IV fluids and antibiotics. Her renal failure has also improved. MRI of the right thigh was done to rule out any deep tissue infection. There was no evidence of abscess or underlying osteomyelitis. Her cellulitis has been slow to improve. Today, it does appear that her erythema is mildly improving. Clinically overall she appears to be significantly improved. Anticipate that she'll be in the hospital for another few days.  Assessment/Plan: Septic shock. Improved. Continue broad-spectrum antibiotics. Cultures have not shown any growth to date. Patient has been weaned off of vasopressors. Continue current management.  cellulitis of right thigh. Erythema still presents and appears to be extending down right side. Intensity of erythema appears to be improving. MRI of the lower extremity to rule out any deep tissue infection is pending. Continue current antibiotics  Atrial Fibrillation with rapid ventricular response. Patient was initially started on Cardizem infusion the emergency room. She has since converted to sinus rhythm and Cardizem was discontinued.  Echocardiogram shows normal ejection fraction. Cardiac markers are negative. TSH normal  Hyponatremia due to depletion. Improved with IV fluids.  Thrombocytopenia. It is secondary to infection. Improving. Continue to follow.  Acute renal failure, likely secondary to volume depletion as well as ARB. Creatinine has improved with IV fluids.    Code Status: full code Family Communication: discussed with patient Disposition Plan: discharge home once improved   Consultants:  Gen surgery for central line placement  Procedures: Central line placement  By Dr. Lovell Sheehan 6/8  Antibiotics:  Vancomycin 6/8 -   Rocephin 6/8 - 6/9  Zosyn 6/9 -   HPI/Subjective: She does not have any new complaints today. She feels that her right leg is still sore, but may be feeling a little better.  Objective: Filed Vitals:   09/08/12 2045 09/09/12 0514 09/09/12 1424 09/09/12 1444  BP: 143/73 127/63  128/67  Pulse: 73 74  67  Temp: 97.6 F (36.4 C) 98.6 F (37 C)  98.2 F (36.8 C)  TempSrc: Oral Oral    Resp: 20 20  16   Height:      Weight:  78.8 kg (173 lb 11.6 oz)    SpO2: 97% 96% 97% 97%    Intake/Output Summary (Last 24 hours) at 09/09/12 1449 Last data filed at 09/09/12 1200  Gross per 24 hour  Intake    780 ml  Output      3 ml  Net    777 ml   Filed Weights   09/07/12 0500 09/08/12 0500 09/09/12 0514  Weight: 74.3 kg (163 lb 12.8 oz) 75.2 kg (165 lb 12.6 oz) 78.8 kg (173 lb 11.6 oz)    Exam:   General:  NAD  Cardiovascular: S1, S2 RRR  Respiratory: CTA  B  Abdomen: soft, nt, nd, bs+  Musculoskeletal: Although erythema is still quite widespread, intensity appears to be improving.  Data Reviewed: Basic Metabolic Panel:  Recent Labs Lab 09/06/12 1337 09/06/12 1605 09/07/12 0104 09/07/12 0459 09/08/12 0552 09/09/12 0538  NA 127*  --  129* 134* 135 138  K 3.0*  --  4.5 4.6 4.0 3.8  CL 90*  --  98 103 107 108  CO2 22  --  20 20 18* 19  GLUCOSE 108*  --  193*  160* 104* 101*  BUN 39*  --  42* 40* 24* 18  CREATININE 1.93*  --  1.96* 1.80* 1.26* 1.13*  CALCIUM 8.3*  --  7.0* 7.0* 7.0* 7.8*  MG  --  1.8  --   --   --   --    Liver Function Tests:  Recent Labs Lab 09/06/12 1337 09/07/12 0459  AST 25 45*  ALT 9 23  ALKPHOS 47 79  BILITOT 0.6 0.3  PROT 6.3 5.2*  ALBUMIN 2.9* 2.1*   No results found for this basename: LIPASE, AMYLASE,  in the last 168 hours No results found for this basename: AMMONIA,  in the last 168 hours CBC:  Recent Labs Lab 09/05/12 1657 09/06/12 1337 09/07/12 0459 09/08/12 0552 09/09/12 0538  WBC 10.7* 8.8 14.2* 11.6* 12.1*  NEUTROABS 9.1* 8.0*  --   --   --   HGB 11.0* 12.1 11.0* 9.9* 9.4*  HCT 33.8* 35.5* 32.2* 29.2* 27.9*  MCV 80.7 80.1 80.9 80.7 79.9  PLT 88* 88* 97* 98* 102*   Cardiac Enzymes:  Recent Labs Lab 09/06/12 1605 09/07/12 0104 09/07/12 0637  TROPONINI <0.30 <0.30 <0.30   BNP (last 3 results) No results found for this basename: PROBNP,  in the last 8760 hours CBG: No results found for this basename: GLUCAP,  in the last 168 hours  Recent Results (from the past 240 hour(s))  CULTURE, BLOOD (ROUTINE X 2)     Status: None   Collection Time    09/06/12  1:38 PM      Result Value Range Status   Specimen Description RIGHT ANTECUBITAL   Final   Special Requests BOTTLES DRAWN AEROBIC AND ANAEROBIC 6CC   Final   Culture NO GROWTH 3 DAYS   Final   Report Status PENDING   Incomplete  CULTURE, BLOOD (ROUTINE X 2)     Status: None   Collection Time    09/06/12  1:44 PM      Result Value Range Status   Specimen Description BLOOD RIGHT HAND   Final   Special Requests BOTTLES DRAWN AEROBIC ONLY 6CC   Final   Culture NO GROWTH 3 DAYS   Final   Report Status PENDING   Incomplete  URINE CULTURE     Status: None   Collection Time    09/06/12  1:57 PM      Result Value Range Status   Specimen Description URINE, CLEAN CATCH   Final   Special Requests NONE   Final   Culture  Setup Time  09/06/2012 21:03   Final   Colony Count NO GROWTH   Final   Culture NO GROWTH   Final   Report Status 09/07/2012 FINAL   Final  MRSA PCR SCREENING     Status: None   Collection Time    09/06/12  4:00 PM      Result Value Range Status   MRSA by PCR NEGATIVE  NEGATIVE Final  Comment:            The GeneXpert MRSA Assay (FDA     approved for NASAL specimens     only), is one component of a     comprehensive MRSA colonization     surveillance program. It is not     intended to diagnose MRSA     infection nor to guide or     monitor treatment for     MRSA infections.     Studies: Mr Femur Right Wo Contrast  09/08/2012   *RADIOLOGY REPORT*  Clinical Data: Right thigh redness and swelling for 1 week.  MRI RIGHT THIGH WITHOUT CONTRAST  Technique:  Multiplanar, multisequence MR imaging of the right thigh was performed.  No intravenous contrast was administered.  Comparison:   None  Findings:  The axial and coronal images incidentally include the left thigh.  There is subcutaneous edema about the thighs bilaterally appearing much worse on the right.  Edema appears most intense in the medial aspect of the right thigh.  No focal fluid collection is identified.  Mild edema is seen within the anterior right gracilis muscle.  There is fatty replacement of the rectus femoris.  Visualized musculature is otherwise unremarkable.  No bone marrow signal abnormality to suggest osteomyelitis is identified.  IMPRESSION:  1.  Subcutaneous edema the thighs bilaterally is much worse on the right and could be due to cellulitis and/or dependent change.  No abscess is identified.  Very mild edema within the substance of the right gracilis could be due to strain or myositis.  Negative for osteomyelitis or abscess. 2.  Fatty replacement of the right rectus femoris is likely due to old injury.   Original Report Authenticated By: Holley Dexter, M.D.    Scheduled Meds: . aspirin  81 mg Oral QPM  . atorvastatin  20 mg  Oral q1800  . citalopram  20 mg Oral Daily  . ferrous sulfate  325 mg Oral Q breakfast  . pantoprazole  40 mg Oral Daily  . piperacillin-tazobactam (ZOSYN)  IV  3.375 g Intravenous Q8H  . sodium chloride  10-40 mL Intracatheter Q12H  . traZODone  100 mg Oral QHS  . vancomycin  750 mg Intravenous Q24H  . zolpidem  5 mg Oral QHS   Continuous Infusions: . sodium chloride 75 mL/hr at 09/09/12 1040    Active Problems:   Coronary artery disease   Atrial fibrillation with RVR   Cellulitis of extremity   Acute renal failure   Hypokalemia   Hyponatremia   Thrombocytopenia, unspecified   Sepsis    Time spent:    Keylon Labelle  [facility] Pager [phone]. If 7PM-7AM, please contact night-coverage at [email or web address], password [email or web address] 09/09/2012, 2:49 PM  LOS: 3 days

## 2012-09-10 DIAGNOSIS — D696 Thrombocytopenia, unspecified: Secondary | ICD-10-CM

## 2012-09-10 DIAGNOSIS — I10 Essential (primary) hypertension: Secondary | ICD-10-CM

## 2012-09-10 LAB — CBC
HCT: 25.8 % — ABNORMAL LOW (ref 36.0–46.0)
MCH: 27.3 pg (ref 26.0–34.0)
MCV: 80.1 fL (ref 78.0–100.0)
Platelets: 128 10*3/uL — ABNORMAL LOW (ref 150–400)
RDW: 15.3 % (ref 11.5–15.5)
WBC: 9.7 10*3/uL (ref 4.0–10.5)

## 2012-09-10 LAB — BASIC METABOLIC PANEL
BUN: 14 mg/dL (ref 6–23)
CO2: 22 mEq/L (ref 19–32)
Calcium: 8.2 mg/dL — ABNORMAL LOW (ref 8.4–10.5)
Chloride: 111 mEq/L (ref 96–112)
Creatinine, Ser: 0.98 mg/dL (ref 0.50–1.10)
Glucose, Bld: 99 mg/dL (ref 70–99)

## 2012-09-10 MED ORDER — VANCOMYCIN HCL 10 G IV SOLR
1500.0000 mg | INTRAVENOUS | Status: DC
  Administered 2012-09-11: 1500 mg via INTRAVENOUS
  Filled 2012-09-10 (×4): qty 1500

## 2012-09-10 MED ORDER — VANCOMYCIN HCL IN DEXTROSE 750-5 MG/150ML-% IV SOLN
750.0000 mg | INTRAVENOUS | Status: AC
  Administered 2012-09-10: 750 mg via INTRAVENOUS
  Filled 2012-09-10: qty 150

## 2012-09-10 NOTE — Progress Notes (Signed)
ANTIBIOTIC CONSULT NOTE  Pharmacy Consult for Vancomycin Indication: cellulitis  Allergies  Allergen Reactions  . Sulfamethoxazole     REACTION: rash   Patient Measurements: Height: 5\' 2"  (157.5 cm) Weight: 173 lb 11.6 oz (78.8 kg) IBW/kg (Calculated) : 50.1  Vital Signs: Temp: 98.3 F (36.8 C) (06/12 0606) BP: 163/76 mmHg (06/12 0606) Pulse Rate: 83 (06/12 0606) Intake/Output from previous day: 06/11 0701 - 06/12 0700 In: 780 [P.O.:780] Out: 2 [Urine:2] Intake/Output from this shift:    Labs:  Recent Labs  09/08/12 0552 09/09/12 0538 09/10/12 0542  WBC 11.6* 12.1* 9.7  HGB 9.9* 9.4* 8.8*  PLT 98* 102* 128*  CREATININE 1.26* 1.13* 0.98   Estimated Creatinine Clearance: 48.2 ml/min (by C-G formula based on Cr of 0.98).  Recent Labs  09/10/12 1100  VANCOTROUGH 5.9*    Microbiology: Recent Results (from the past 720 hour(s))  CULTURE, BLOOD (ROUTINE X 2)     Status: None   Collection Time    09/06/12  1:38 PM      Result Value Range Status   Specimen Description RIGHT ANTECUBITAL   Final   Special Requests BOTTLES DRAWN AEROBIC AND ANAEROBIC 6CC   Final   Culture NO GROWTH 4 DAYS   Final   Report Status PENDING   Incomplete  CULTURE, BLOOD (ROUTINE X 2)     Status: None   Collection Time    09/06/12  1:44 PM      Result Value Range Status   Specimen Description BLOOD RIGHT HAND   Final   Special Requests BOTTLES DRAWN AEROBIC ONLY 6CC   Final   Culture NO GROWTH 4 DAYS   Final   Report Status PENDING   Incomplete  URINE CULTURE     Status: None   Collection Time    09/06/12  1:57 PM      Result Value Range Status   Specimen Description URINE, CLEAN CATCH   Final   Special Requests NONE   Final   Culture  Setup Time 09/06/2012 21:03   Final   Colony Count NO GROWTH   Final   Culture NO GROWTH   Final   Report Status 09/07/2012 FINAL   Final  MRSA PCR SCREENING     Status: None   Collection Time    09/06/12  4:00 PM      Result Value Range  Status   MRSA by PCR NEGATIVE  NEGATIVE Final   Comment:            The GeneXpert MRSA Assay (FDA     approved for NASAL specimens     only), is one component of a     comprehensive MRSA colonization     surveillance program. It is not     intended to diagnose MRSA     infection nor to guide or     monitor treatment for     MRSA infections.   Medical History: Past Medical History  Diagnosis Date  . Depressive disorder, not elsewhere classified   . Anxiety state, unspecified   . Pure hypercholesterolemia   . Esophageal reflux   . Infection of esophagostomy   . Congestive heart failure, unspecified     diastolic heart failure  . Pulmonary hypertension     PA systolic pressure 55-65 mmHg by echocardiogram, PA pressure 33/10 by cardiac catheterization PA saturation 62% thermodilution cardiac index 2.0 thick cardiac index 2.4  . Symptomatic bradycardia     on beta blocker  .  Tricuspid regurgitation     moderate tricuspid regurgitation by echocardiogram, prior use of anorexic agents  . Melanoma 1995    removal on back   . Coronary artery disease     Moderate to severe coronary disease involving the left anterior descending artery and right coronary artery.  Sequential stenosis in the LAD is significant.  Right coronary artery is moderate to severely likely nonischemic.,  Questionable small LVOT obstruction.  No Brockenbrough maneuver was performed.  Catheterization January 2013  . PONV (postoperative nausea and vomiting)   . Hypertension   . Dysrhythmia   . H/O hiatal hernia   . History of percutaneous coronary intervention     Successful angioplasty and drug-eluting stent placed to an 80% lesion in the proximal LAD.,  Ostial lesion in the LAD was estimated to be 60% with an FFR ratio of 0.83, medical therapy was recommended for the latter lesion.  Dual antiplatelet therapy for one year.  Procedure performed May 08, 2011   Medications:  Scheduled:  . aspirin  81 mg Oral QPM   . atorvastatin  20 mg Oral q1800  . citalopram  20 mg Oral Daily  . ferrous sulfate  325 mg Oral Q breakfast  . pantoprazole  40 mg Oral Daily  . piperacillin-tazobactam (ZOSYN)  IV  3.375 g Intravenous Q8H  . sodium chloride  10-40 mL Intracatheter Q12H  . traZODone  100 mg Oral QHS  . vancomycin  750 mg Intravenous Q24H  . zolpidem  5 mg Oral QHS   Assessment: Okay for Protocol Estimated Creatinine Clearance: 48.2 ml/min (by C-G formula based on Cr of 0.98).  SCr has improved since admission.  Trough level is below goal.  Goal of Therapy:  Vancomycin trough level 10-15 mcg/ml  Plan:  Increase Vancomycin to 1500mg  IV every 24 hours. Check trough level weekly while on Vancomycin Check SCr at least twice weekly while on Vancomycin Measure antibiotic drug levels at steady state Follow up culture results Zosyn 3.375gm IV every 8 hours.  Margo Aye, Federick Levene A 09/10/2012,11:51 AM

## 2012-09-10 NOTE — Progress Notes (Signed)
Called to room by family stating they wanted RN to look at patient's leg.  Found patient's right inner thigh to have a whelped up, water-filled area.  Patient's daughter stated that this place occurred after her sister had placed a pillow under her leg for elevation.  Removed pillow and instructed patient to keep area free from pillows d/t heat and moisture.  Will continue to monitor patient's leg.  Schonewitz, Candelaria Stagers 09/10/2012

## 2012-09-10 NOTE — Progress Notes (Signed)
UR chart review completed.  

## 2012-09-10 NOTE — Progress Notes (Signed)
[name] WUJ:[id number] DOB: 08/02/1934 DOA: 09/06/2012 PCP: Remus Loffler, PA-C   Subjective: This lady says her leg has improved since yesterday. She does not have a fever.           Physical Exam: Blood pressure 163/76, pulse 83, temperature 98.3 F (36.8 C), temperature source Oral, resp. rate 18, height 5\' 2"  (1.575 m), weight 78.8 kg (173 lb 11.6 oz), SpO2 95.00%. She looks systemically well. She is nontoxic or septic. She is hemodynamic is stable. Her right leg does show extensive cellulitis extending from the inguinal area down to the right knee. The skin is warm, red and angry looking. Lung fields are clear. She is alert and orientated.   Investigations:  Recent Results (from the past 240 hour(s))  CULTURE, BLOOD (ROUTINE X 2)     Status: None   Collection Time    09/06/12  1:38 PM      Result Value Range Status   Specimen Description RIGHT ANTECUBITAL   Final   Special Requests BOTTLES DRAWN AEROBIC AND ANAEROBIC 6CC   Final   Culture NO GROWTH 3 DAYS   Final   Report Status PENDING   Incomplete  CULTURE, BLOOD (ROUTINE X 2)     Status: None   Collection Time    09/06/12  1:44 PM      Result Value Range Status   Specimen Description BLOOD RIGHT HAND   Final   Special Requests BOTTLES DRAWN AEROBIC ONLY 6CC   Final   Culture NO GROWTH 3 DAYS   Final   Report Status PENDING   Incomplete  URINE CULTURE     Status: None   Collection Time    09/06/12  1:57 PM      Result Value Range Status   Specimen Description URINE, CLEAN CATCH   Final   Special Requests NONE   Final   Culture  Setup Time 09/06/2012 21:03   Final   Colony Count NO GROWTH   Final   Culture NO GROWTH   Final   Report Status 09/07/2012 FINAL   Final  MRSA PCR SCREENING     Status: None   Collection Time    09/06/12  4:00 PM      Result Value Range Status   MRSA by PCR NEGATIVE  NEGATIVE Final   Comment:            The GeneXpert MRSA Assay (FDA     approved for NASAL specimens   only), is one component of a     comprehensive MRSA colonization     surveillance program. It is not     intended to diagnose MRSA     infection nor to guide or     monitor treatment for     MRSA infections.     Basic Metabolic Panel:  Recent Labs  95/62/13 0538 09/10/12 0542  NA 138 138  K 3.8 3.8  CL 108 111  CO2 19 22  GLUCOSE 101* 99  BUN 18 14  CREATININE 1.13* 0.98  CALCIUM 7.8* 8.2*       CBC:  Recent Labs  09/09/12 0538 09/10/12 0542  WBC 12.1* 9.7  HGB 9.4* 8.8*  HCT 27.9* 25.8*  MCV 79.9 80.1  PLT 102* 128*    Mr Femur Right Wo Contrast  09/08/2012   *RADIOLOGY REPORT*  Clinical Data: Right thigh redness and swelling for 1 week.  MRI RIGHT THIGH WITHOUT CONTRAST  Technique:  Multiplanar, multisequence MR imaging  of the right thigh was performed.  No intravenous contrast was administered.  Comparison:   None  Findings:  The axial and coronal images incidentally include the left thigh.  There is subcutaneous edema about the thighs bilaterally appearing much worse on the right.  Edema appears most intense in the medial aspect of the right thigh.  No focal fluid collection is identified.  Mild edema is seen within the anterior right gracilis muscle.  There is fatty replacement of the rectus femoris.  Visualized musculature is otherwise unremarkable.  No bone marrow signal abnormality to suggest osteomyelitis is identified.  IMPRESSION:  1.  Subcutaneous edema the thighs bilaterally is much worse on the right and could be due to cellulitis and/or dependent change.  No abscess is identified.  Very mild edema within the substance of the right gracilis could be due to strain or myositis.  Negative for osteomyelitis or abscess. 2.  Fatty replacement of the right rectus femoris is likely due to old injury.   Original Report Authenticated By: Holley Dexter, M.D.      Medications: I have reviewed the patient's current medications.  Impression: 1. Right leg  cellulitis, improving slowly. 2. Septic shock secondary to #1, improved. 3. Acute renal failure secondary to volume depletion and compounded by ARB, resolved. 4. Anemia, normocytic, stable. 5. Thrombocytopenia, likely related to sepsis, improving.     Plan: 1. Continue with current therapy. 2. IV fluids to [facility]. 3. Repeat blood work tomorrow including CBC and complete metabolic panel. 4. Possible discharge home in the next 1-2 days.  Consultants:  None.   Procedures:  None.   Antibiotics:   intravenous vancomycin, started 09/06/2012.  Intravenous Zosyn started 09/07/2012.                   Code Status: Full code.  Family Communication: Discussed plan with patient at the bedside.   Disposition Plan: Home when medically stable and improved.  Time spent: 20 minutes.   LOS: 4 days   Wilson Singer Pager [phone]  09/10/2012, 8:34 AM

## 2012-09-11 LAB — BASIC METABOLIC PANEL
CO2: 24 mEq/L (ref 19–32)
Calcium: 8.4 mg/dL (ref 8.4–10.5)
Creatinine, Ser: 0.97 mg/dL (ref 0.50–1.10)
GFR calc non Af Amer: 56 mL/min — ABNORMAL LOW (ref >90)
Glucose, Bld: 87 mg/dL (ref 70–99)

## 2012-09-11 LAB — CBC
MCH: 27.1 pg (ref 26.0–34.0)
MCHC: 33.6 g/dL (ref 30.0–36.0)
MCV: 80.6 fL (ref 78.0–100.0)
Platelets: 160 10*3/uL (ref 150–400)
RBC: 3.1 MIL/uL — ABNORMAL LOW (ref 3.87–5.11)

## 2012-09-11 NOTE — Progress Notes (Signed)
[name] ZOX:[id number] DOB: 05/16/1934 DOA: 09/06/2012 PCP: Remus Loffler, PA-C   Subjective: This lady says her leg has improved further since yesterday. She does not have a fever.           Physical Exam: Blood pressure 131/63, pulse 87, temperature 98.9 F (37.2 C), temperature source Oral, resp. rate 20, height 5\' 2"  (1.575 m), weight 82 kg (180 lb 12.4 oz), SpO2 93.00%. She looks systemically well. She is nontoxic or septic. She is hemodynamic is stable. Her right leg does show extensive cellulitis extending from the inguinal area down to the right knee, however this is better than yesterday. The skin is warm, red and less angry looking. Lung fields are clear. She is alert and orientated.   Investigations:  Recent Results (from the past 240 hour(s))  CULTURE, BLOOD (ROUTINE X 2)     Status: None   Collection Time    09/06/12  1:38 PM      Result Value Range Status   Specimen Description RIGHT ANTECUBITAL   Final   Special Requests BOTTLES DRAWN AEROBIC AND ANAEROBIC 6CC   Final   Culture NO GROWTH 4 DAYS   Final   Report Status PENDING   Incomplete  CULTURE, BLOOD (ROUTINE X 2)     Status: None   Collection Time    09/06/12  1:44 PM      Result Value Range Status   Specimen Description BLOOD RIGHT HAND   Final   Special Requests BOTTLES DRAWN AEROBIC ONLY 6CC   Final   Culture NO GROWTH 4 DAYS   Final   Report Status PENDING   Incomplete  URINE CULTURE     Status: None   Collection Time    09/06/12  1:57 PM      Result Value Range Status   Specimen Description URINE, CLEAN CATCH   Final   Special Requests NONE   Final   Culture  Setup Time 09/06/2012 21:03   Final   Colony Count NO GROWTH   Final   Culture NO GROWTH   Final   Report Status 09/07/2012 FINAL   Final  MRSA PCR SCREENING     Status: None   Collection Time    09/06/12  4:00 PM      Result Value Range Status   MRSA by PCR NEGATIVE  NEGATIVE Final   Comment:            The GeneXpert MRSA Assay  (FDA     approved for NASAL specimens     only), is one component of a     comprehensive MRSA colonization     surveillance program. It is not     intended to diagnose MRSA     infection nor to guide or     monitor treatment for     MRSA infections.     Basic Metabolic Panel:  Recent Labs  81/19/14 0542 09/11/12 0504  NA 138 136  K 3.8 3.8  CL 111 106  CO2 22 24  GLUCOSE 99 87  BUN 14 13  CREATININE 0.98 0.97  CALCIUM 8.2* 8.4       CBC:  Recent Labs  09/10/12 0542 09/11/12 0504  WBC 9.7 9.2  HGB 8.8* 8.4*  HCT 25.8* 25.0*  MCV 80.1 80.6  PLT 128* 160    No results found.    Medications: I have reviewed the patient's current medications.  Impression: 1. Right leg cellulitis, improving slowly. 2. Septic  shock secondary to #1, improved. 3. Acute renal failure secondary to volume depletion and compounded by ARB, resolved. 4. Anemia, normocytic, stable. 5. Thrombocytopenia, likely related to sepsis, improving.     Plan: 1. Continue with current therapy. 2. I think she will be ready for discharge tomorrow if she continues to improve in this manner.  Consultants:  None.   Procedures:  None.   Antibiotics:   intravenous vancomycin, started 09/06/2012.  Intravenous Zosyn started 09/07/2012.                   Code Status: Full code.  Family Communication: Discussed plan with patient at the bedside.   Disposition Plan: Home when medically stable and improved.  Time spent: 20 minutes.   LOS: 5 days   Wilson Singer Pager [phone]  09/11/2012, 8:33 AM

## 2012-09-12 DIAGNOSIS — R609 Edema, unspecified: Secondary | ICD-10-CM

## 2012-09-12 MED ORDER — LEVOFLOXACIN 750 MG PO TABS: 750.0000 mg | ORAL_TABLET | Freq: Every day | ORAL | Status: AC

## 2012-09-12 MED ORDER — CITALOPRAM HYDROBROMIDE 40 MG PO TABS: 20.0000 mg | ORAL_TABLET | Freq: Every day | ORAL | Status: DC

## 2012-09-12 NOTE — Progress Notes (Signed)
D/c instructions reviewed with patient.  Verbalized understanding.  Pt to be dc'd to home with family. Schonewitz, Candelaria Stagers 09/12/2012

## 2012-09-12 NOTE — Discharge Summary (Signed)
Physician Discharge Summary  [name] ZOX:[id number] DOB: [birth year] DOA: 09/06/2012  PCP: Remus Loffler, PA-C  Admit date: 09/06/2012 Discharge date: 09/12/2012  Time spent: Greater than 30 minutes  Recommendations for Outpatient Follow-up:  1. Followup with primary care physician in one week.  Discharge Diagnoses:  1. Right leg cellulitis, improving. 2. Acute renal failure, resolved. 3. Sepsis and septic shock, resolved. 4. Anemia of chronic disease, will need to be monitored.   Discharge Condition: Stable and improving.  Diet recommendation: Regular.  Filed Weights   09/08/12 0500 09/09/12 0514 09/11/12 0553  Weight: 75.2 kg (165 lb 12.6 oz) 78.8 kg (173 lb 11.6 oz) 82 kg (180 lb 12.4 oz)    History of present illness:  This very pleasant 78 year old lady presents to the hospital with symptoms of erythema over the right thigh. Please see initial history as outlined below: HPI: [name] is a 78 y.o. female presents to the emergency room with complaints of right thigh erythema. Her daughter reports that she had noted a small coin-sized area of erythema over right medial thigh approximately 5-6 days ago. Over the last few days, this is progressively gotten worse and has become a circumferential erythema over right side. She had presented to the emergency room at [facility] yesterday. She received a dose of IV Rocephin and was given a prescription for Keflex and discharged home. Her erythema was demarcated with a pen. Overnight, erythema is progressively gotten worse and began to spread further distally. She has been having fevers greater than 101. She denies any dysuria, no vomiting, she does report having a cough for the past 2 days. Denies any shortness of breath. No chest pain. She's also had some loose stools which she describes as dark green. She was evaluated in the emergency room and noted to be in acute renal failure, dehydration and atrial fibrillation without  ventricular response. She was started on a Cardizem infusion, given antibiotics and referred for admission to  Hospital Course:  This lady was admitted with right thigh cellulitis. She had a fairly complicated course, as she was significantly dehydrated and in acute renal failure initially and soon developed atrial fibrillation with rapid ventricular response. Therefore, she was managed in the intensive care unit with broad-spectrum antibiotics and Cardizem infusion. Subsequently, her atrial fibrillation converted back to sinus rhythm, unfortunately she developed hypotension in the setting of sepsis and she required vasopressors for a short while. She improved with intravenous fluids and antibiotics, her renal function also improved. MRI of the right thigh was done and this did not show any evidence of abscess or underlying osteomyelitis. She has been gradually improving every day and today she looks well enough that she could be managed on oral antibiotics for another week or so. Blood cultures have been negative. She is now stable for discharge.  Procedures:  None.  Consultations:  None.  Discharge Exam: Filed Vitals:   09/11/12 0553 09/11/12 1632 09/11/12 2100 09/12/12 0628  BP: 131/63 141/52 146/74 149/73  Pulse: 87 67 68 73  Temp: 98.9 F (37.2 C) 98.6 F (37 C)  98.1 F (36.7 C)  TempSrc: Oral Oral  Oral  Resp: 20 20 18 19   Height:      Weight: 82 kg (180 lb 12.4 oz)     SpO2: 93% 98% 96% 93%    General: She looks systemically well. She does not look toxic or septic. Cardiovascular: Heart sounds are present without murmurs or added sounds. She is  in sinus rhythm. Respiratory: Lung fields are clear. She is alert and orientated. The right thigh still has cellulitis but significantly improved from a couple of days ago.  Discharge Instructions  Discharge Orders   Future Appointments Provider Department Dept Phone   11/11/2012 1:00 PM Laqueta Linden, MD  [facility] (near [place]) [phone]   Future Orders Complete By Expires     Diet - low sodium heart healthy  As directed     Increase activity slowly  As directed         Medication List    STOP taking these medications       cephALEXin 500 MG capsule  Commonly known as:  KEFLEX     meloxicam 7.5 MG tablet  Commonly known as:  MOBIC      TAKE these medications       alendronate 70 MG tablet  Commonly known as:  FOSAMAX  Take 70 mg by mouth every 7 (seven) days. Take with a full glass of water on an empty stomach on Mondays     ALPRAZolam 0.25 MG tablet  Commonly known as:  XANAX  0.25 mg 3 (three) times daily as needed for anxiety. Take 1/2 to 1 by mouth three times daily     aspirin 81 MG chewable tablet  Chew 81 mg by mouth every evening.     carvedilol 3.125 MG tablet  Commonly known as:  COREG  Take 1 tablet (3.125 mg total) by mouth 2 (two) times daily with a meal.     citalopram 40 MG tablet  Commonly known as:  CELEXA  Take 0.5 tablets (20 mg total) by mouth daily.     esomeprazole 40 MG capsule  Commonly known as:  NEXIUM  Take 40 mg by mouth daily before breakfast.     ferrous sulfate 325 (65 FE) MG EC tablet  Take 325 mg by mouth daily with breakfast.     furosemide 40 MG tablet  Commonly known as:  LASIX  Take 40 mg by mouth daily.     levofloxacin 750 MG tablet  Commonly known as:  LEVAQUIN  Take 1 tablet (750 mg total) by mouth daily.     losartan 100 MG tablet  Commonly known as:  COZAAR  Take 100 mg by mouth daily.     multivitamin with minerals Tabs  Take 1 tablet by mouth daily. Centrum silver (equate)     nitroGLYCERIN 0.4 MG SL tablet  Commonly known as:  NITROSTAT  Place 1 tablet (0.4 mg total) under the tongue every 5 (five) minutes as needed for chest pain.     potassium chloride 10 MEQ CR tablet  Commonly known as:  KLOR-CON  Take 10 mEq by mouth daily.     PROAIR HFA 108 (90 BASE) MCG/ACT inhaler  Generic drug:  albuterol   Inhale 2 puffs into the lungs every 6 (six) hours as needed.     rosuvastatin 10 MG tablet  Commonly known as:  CRESTOR  Take 10 mg by mouth at bedtime.     traMADol 50 MG tablet  Commonly known as:  ULTRAM  Take 0.5 tablets (25 mg total) by mouth every 6 (six) hours as needed for pain.     traZODone 100 MG tablet  Commonly known as:  DESYREL  Take 100 mg by mouth at bedtime.     Vitamin D (Ergocalciferol) 50000 UNITS Caps  Commonly known as:  DRISDOL  Take 50,000 Units by mouth every  7 (seven) days. Patient takes on Tuesdays     zolpidem 10 MG tablet  Commonly known as:  AMBIEN  Take 10 mg by mouth at bedtime.       Allergies  Allergen Reactions  . Sulfamethoxazole     REACTION: rash       Follow-up Information   Follow up with [facility].   Contact information:   [address] [place] [address] [phone]      Follow up with Remus Loffler, PA-C. Schedule an appointment as soon as possible for a visit in 1 week.   Contact information:   [address] [place] [address] [phone]        The results of significant diagnostics from this hospitalization (including imaging, microbiology, ancillary and laboratory) are listed below for reference.    Significant Diagnostic Studies: Dg Chest 1 View  09/06/2012   *RADIOLOGY REPORT*  Clinical Data: Right thigh cellulitis  CHEST - 1 VIEW  Comparison: Prior chest x-[place] 03/02/2005  Findings: The lungs are clear.  Stable cardiomegaly.  Moderately large hiatal hernia.  Central bronchitic changes are stable and unchanged.  Surgical clips again noted in the mid epigastric region.  No acute osseous abnormality.  IMPRESSION: No acute cardiopulmonary disease. Stable chest x-[place].   Original Report Authenticated By: Malachy Moan, M.D.   Mr Femur Right Wo Contrast  09/08/2012   *RADIOLOGY REPORT*  Clinical Data: Right thigh redness and swelling for 1 week.  MRI RIGHT THIGH WITHOUT CONTRAST  Technique:   Multiplanar, multisequence MR imaging of the right thigh was performed.  No intravenous contrast was administered.  Comparison:   None  Findings:  The axial and coronal images incidentally include the left thigh.  There is subcutaneous edema about the thighs bilaterally appearing much worse on the right.  Edema appears most intense in the medial aspect of the right thigh.  No focal fluid collection is identified.  Mild edema is seen within the anterior right gracilis muscle.  There is fatty replacement of the rectus femoris.  Visualized musculature is otherwise unremarkable.  No bone marrow signal abnormality to suggest osteomyelitis is identified.  IMPRESSION:  1.  Subcutaneous edema the thighs bilaterally is much worse on the right and could be due to cellulitis and/or dependent change.  No abscess is identified.  Very mild edema within the substance of the right gracilis could be due to strain or myositis.  Negative for osteomyelitis or abscess. 2.  Fatty replacement of the right rectus femoris is likely due to old injury.   Original Report Authenticated By: Holley Dexter, M.D.   Dg Chest Port 1 View  09/06/2012   *RADIOLOGY REPORT*  Clinical Data: New left central venous line placement.  PORTABLE CHEST - 1 VIEW  Comparison: 09/06/2012 at 1400 hours.  Findings: Interval placement of a left subclavian central venous catheter with tip overlying the mid SVC region.  No pneumothorax. Shallow inspiration.  Mild cardiac enlargement with normal pulmonary vascularity.  No pleural effusion or consolidation. Surgical clips in the left upper quadrant.  Previous resection or resorption of the distal right clavicle.  IMPRESSION: Left central venous catheter placed with tip over the mid SVC region.  No pneumothorax.  Otherwise stable appearance of the chest.   Original Report Authenticated By: Burman Nieves, M.D.    Microbiology: Recent Results (from the past 240 hour(s))  CULTURE, BLOOD (ROUTINE X 2)     Status:  None   Collection Time  09/06/12  1:38 PM      Result Value Range Status   Specimen Description RIGHT ANTECUBITAL   Final   Special Requests BOTTLES DRAWN AEROBIC AND ANAEROBIC 6CC   Final   Culture NO GROWTH 4 DAYS   Final   Report Status PENDING   Incomplete  CULTURE, BLOOD (ROUTINE X 2)     Status: None   Collection Time    09/06/12  1:44 PM      Result Value Range Status   Specimen Description BLOOD RIGHT HAND   Final   Special Requests BOTTLES DRAWN AEROBIC ONLY 6CC   Final   Culture NO GROWTH 4 DAYS   Final   Report Status PENDING   Incomplete  URINE CULTURE     Status: None   Collection Time    09/06/12  1:57 PM      Result Value Range Status   Specimen Description URINE, CLEAN CATCH   Final   Special Requests NONE   Final   Culture  Setup Time 09/06/2012 21:03   Final   Colony Count NO GROWTH   Final   Culture NO GROWTH   Final   Report Status 09/07/2012 FINAL   Final  MRSA PCR SCREENING     Status: None   Collection Time    09/06/12  4:00 PM      Result Value Range Status   MRSA by PCR NEGATIVE  NEGATIVE Final   Comment:            The GeneXpert MRSA Assay (FDA     approved for NASAL specimens     only), is one component of a     comprehensive MRSA colonization     surveillance program. It is not     intended to diagnose MRSA     infection nor to guide or     monitor treatment for     MRSA infections.     Labs: Basic Metabolic Panel:  Recent Labs Lab 09/06/12 1337 09/06/12 1605  09/07/12 0459 09/08/12 0552 09/09/12 0538 09/10/12 0542 09/11/12 0504  NA 127*  --   < > 134* 135 138 138 136  K 3.0*  --   < > 4.6 4.0 3.8 3.8 3.8  CL 90*  --   < > 103 107 108 111 106  CO2 22  --   < > 20 18* 19 22 24   GLUCOSE 108*  --   < > 160* 104* 101* 99 87  BUN 39*  --   < > 40* 24* 18 14 13   CREATININE 1.93*  --   < > 1.80* 1.26* 1.13* 0.98 0.97  CALCIUM 8.3*  --   < > 7.0* 7.0* 7.8* 8.2* 8.4  MG  --  1.8  --   --   --   --   --   --   < > = values in this  interval not displayed. Liver Function Tests:  Recent Labs Lab 09/06/12 1337 09/07/12 0459  AST 25 45*  ALT 9 23  ALKPHOS 47 79  BILITOT 0.6 0.3  PROT 6.3 5.2*  ALBUMIN 2.9* 2.1*     CBC:  Recent Labs Lab 09/05/12 1657 09/06/12 1337 09/07/12 0459 09/08/12 0552 09/09/12 0538 09/10/12 0542 09/11/12 0504  WBC 10.7* 8.8 14.2* 11.6* 12.1* 9.7 9.2  NEUTROABS 9.1* 8.0*  --   --   --   --   --   HGB 11.0* 12.1 11.0* 9.9* 9.4* 8.8* 8.4*  HCT 33.8* 35.5* 32.2* 29.2* 27.9* 25.8* 25.0*  MCV 80.7 80.1 80.9 80.7 79.9 80.1 80.6  PLT 88* 88* 97* 98* 102* 128* 160   Cardiac Enzymes:  Recent Labs Lab 09/06/12 1605 09/07/12 0104 09/07/12 0637  TROPONINI <0.30 <0.30 <0.30        Signed:  Lilly Cove C  [facility] 09/12/2012, 8:20 AM

## 2012-09-13 LAB — CULTURE, BLOOD (ROUTINE X 2)
Culture: NO GROWTH
Culture: NO GROWTH

## 2012-10-23 ENCOUNTER — Observation Stay (HOSPITAL_COMMUNITY)
Admission: EM | Admit: 2012-10-23 | Discharge: 2012-10-24 | Disposition: A | Discharge status: Home / Self Care | Payer: Medicare Other | Attending: Internal Medicine | Admitting: Internal Medicine

## 2012-10-23 ENCOUNTER — Emergency Department (HOSPITAL_COMMUNITY): Payer: Medicare Other

## 2012-10-23 DIAGNOSIS — N179 Acute kidney failure, unspecified: Secondary | ICD-10-CM

## 2012-10-23 DIAGNOSIS — I4891 Unspecified atrial fibrillation: Secondary | ICD-10-CM

## 2012-10-23 DIAGNOSIS — I509 Heart failure, unspecified: Secondary | ICD-10-CM | POA: Insufficient documentation

## 2012-10-23 DIAGNOSIS — R609 Edema, unspecified: Secondary | ICD-10-CM

## 2012-10-23 DIAGNOSIS — I272 Pulmonary hypertension, unspecified: Secondary | ICD-10-CM

## 2012-10-23 DIAGNOSIS — R5381 Other malaise: Secondary | ICD-10-CM | POA: Insufficient documentation

## 2012-10-23 DIAGNOSIS — E7800 Pure hypercholesterolemia, unspecified: Secondary | ICD-10-CM

## 2012-10-23 DIAGNOSIS — F3289 Other specified depressive episodes: Secondary | ICD-10-CM

## 2012-10-23 DIAGNOSIS — I519 Heart disease, unspecified: Secondary | ICD-10-CM | POA: Diagnosis present

## 2012-10-23 DIAGNOSIS — F329 Major depressive disorder, single episode, unspecified: Secondary | ICD-10-CM

## 2012-10-23 DIAGNOSIS — I10 Essential (primary) hypertension: Secondary | ICD-10-CM

## 2012-10-23 DIAGNOSIS — I2789 Other specified pulmonary heart diseases: Secondary | ICD-10-CM | POA: Insufficient documentation

## 2012-10-23 DIAGNOSIS — R0609 Other forms of dyspnea: Secondary | ICD-10-CM

## 2012-10-23 DIAGNOSIS — A419 Sepsis, unspecified organism: Secondary | ICD-10-CM

## 2012-10-23 DIAGNOSIS — K2270 Barrett's esophagus without dysplasia: Secondary | ICD-10-CM

## 2012-10-23 DIAGNOSIS — Z9861 Coronary angioplasty status: Secondary | ICD-10-CM

## 2012-10-23 DIAGNOSIS — K5900 Constipation, unspecified: Secondary | ICD-10-CM

## 2012-10-23 DIAGNOSIS — E876 Hypokalemia: Secondary | ICD-10-CM

## 2012-10-23 DIAGNOSIS — L03119 Cellulitis of unspecified part of limb: Secondary | ICD-10-CM

## 2012-10-23 DIAGNOSIS — D696 Thrombocytopenia, unspecified: Secondary | ICD-10-CM

## 2012-10-23 DIAGNOSIS — I498 Other specified cardiac arrhythmias: Secondary | ICD-10-CM | POA: Insufficient documentation

## 2012-10-23 DIAGNOSIS — R002 Palpitations: Secondary | ICD-10-CM

## 2012-10-23 DIAGNOSIS — E871 Hypo-osmolality and hyponatremia: Secondary | ICD-10-CM

## 2012-10-23 DIAGNOSIS — I2510 Atherosclerotic heart disease of native coronary artery without angina pectoris: Secondary | ICD-10-CM

## 2012-10-23 DIAGNOSIS — R001 Bradycardia, unspecified: Secondary | ICD-10-CM

## 2012-10-23 DIAGNOSIS — K219 Gastro-esophageal reflux disease without esophagitis: Secondary | ICD-10-CM

## 2012-10-23 DIAGNOSIS — F411 Generalized anxiety disorder: Secondary | ICD-10-CM

## 2012-10-23 DIAGNOSIS — I071 Rheumatic tricuspid insufficiency: Secondary | ICD-10-CM

## 2012-10-23 DIAGNOSIS — R079 Chest pain, unspecified: Principal | ICD-10-CM

## 2012-10-23 DIAGNOSIS — K449 Diaphragmatic hernia without obstruction or gangrene: Secondary | ICD-10-CM | POA: Insufficient documentation

## 2012-10-23 LAB — URINALYSIS, ROUTINE W REFLEX MICROSCOPIC
Glucose, UA: NEGATIVE mg/dL
Hgb urine dipstick: NEGATIVE
Specific Gravity, Urine: 1.011 (ref 1.005–1.030)
Urobilinogen, UA: 0.2 mg/dL (ref 0.0–1.0)
pH: 6 (ref 5.0–8.0)

## 2012-10-23 LAB — CBC
MCH: 27.2 pg (ref 26.0–34.0)
MCV: 80.8 fL (ref 78.0–100.0)
Platelets: 121 10*3/uL — ABNORMAL LOW (ref 150–400)
RDW: 14.5 % (ref 11.5–15.5)
WBC: 4.6 10*3/uL (ref 4.0–10.5)

## 2012-10-23 LAB — TROPONIN I: Troponin I: <0.3 ng/mL (ref <0.30)

## 2012-10-23 LAB — COMPREHENSIVE METABOLIC PANEL
AST: 13 U/L (ref 0–37)
Albumin: 3.6 g/dL (ref 3.5–5.2)
Calcium: 10.3 mg/dL (ref 8.4–10.5)
Creatinine, Ser: 1.53 mg/dL — ABNORMAL HIGH (ref 0.50–1.10)
Sodium: 140 mEq/L (ref 135–145)

## 2012-10-23 MED ORDER — ONDANSETRON HCL 4 MG/2ML IJ SOLN: 4.0000 mg | Freq: Four times a day (QID) | INTRAMUSCULAR | Status: DC | PRN

## 2012-10-23 MED ORDER — TRAMADOL HCL 50 MG PO TABS: 25.0000 mg | ORAL_TABLET | Freq: Four times a day (QID) | ORAL | Status: DC | PRN

## 2012-10-23 MED ORDER — NITROGLYCERIN 0.4 MG SL SUBL: 0.4000 mg | SUBLINGUAL_TABLET | SUBLINGUAL | Status: DC | PRN

## 2012-10-23 MED ORDER — ATORVASTATIN CALCIUM 20 MG PO TABS
20.0000 mg | ORAL_TABLET | Freq: Every day | ORAL | Status: DC
  Filled 2012-10-23: qty 1

## 2012-10-23 MED ORDER — PANTOPRAZOLE SODIUM 40 MG PO TBEC
40.0000 mg | DELAYED_RELEASE_TABLET | Freq: Two times a day (BID) | ORAL | Status: DC
  Administered 2012-10-24: 40 mg via ORAL
  Filled 2012-10-23 (×2): qty 1

## 2012-10-23 MED ORDER — NITROGLYCERIN 0.4 MG/SPRAY TL SOLN: 1.0000 | Status: DC | PRN

## 2012-10-23 MED ORDER — NITROGLYCERIN 0.4 MG SL SUBL
0.4000 mg | SUBLINGUAL_TABLET | SUBLINGUAL | Status: DC | PRN
  Administered 2012-10-23: 0.4 mg via SUBLINGUAL

## 2012-10-23 MED ORDER — ZOLPIDEM TARTRATE 5 MG PO TABS: 5.0000 mg | ORAL_TABLET | Freq: Every evening | ORAL | Status: DC | PRN

## 2012-10-23 MED ORDER — FERROUS SULFATE 325 (65 FE) MG PO TABS
325.0000 mg | ORAL_TABLET | Freq: Every day | ORAL | Status: DC
  Administered 2012-10-24: 325 mg via ORAL
  Filled 2012-10-23 (×2): qty 1

## 2012-10-23 MED ORDER — MORPHINE SULFATE 2 MG/ML IJ SOLN: 0.5000 mg | INTRAMUSCULAR | Status: DC | PRN

## 2012-10-23 MED ORDER — TRAZODONE HCL 100 MG PO TABS
100.0000 mg | ORAL_TABLET | Freq: Every day | ORAL | Status: DC
  Administered 2012-10-23: 100 mg via ORAL
  Filled 2012-10-23 (×2): qty 1

## 2012-10-23 MED ORDER — SODIUM CHLORIDE 0.9 % IV SOLN: INTRAVENOUS | Status: DC

## 2012-10-23 MED ORDER — ACETAMINOPHEN 325 MG PO TABS
650.0000 mg | ORAL_TABLET | Freq: Four times a day (QID) | ORAL | Status: DC | PRN
  Administered 2012-10-24: 650 mg via ORAL
  Filled 2012-10-23: qty 2

## 2012-10-23 MED ORDER — ONDANSETRON HCL 4 MG PO TABS: 4.0000 mg | ORAL_TABLET | Freq: Four times a day (QID) | ORAL | Status: DC | PRN

## 2012-10-23 MED ORDER — CITALOPRAM HYDROBROMIDE 20 MG PO TABS
20.0000 mg | ORAL_TABLET | Freq: Every day | ORAL | Status: DC
  Administered 2012-10-23 – 2012-10-24 (×2): 20 mg via ORAL
  Filled 2012-10-23 (×2): qty 1

## 2012-10-23 MED ORDER — SODIUM CHLORIDE 0.9 % IJ SOLN
3.0000 mL | Freq: Two times a day (BID) | INTRAMUSCULAR | Status: DC
  Administered 2012-10-23 – 2012-10-24 (×2): 3 mL via INTRAVENOUS

## 2012-10-23 MED ORDER — ASPIRIN 81 MG PO CHEW
81.0000 mg | CHEWABLE_TABLET | Freq: Every evening | ORAL | Status: DC
  Filled 2012-10-23: qty 1

## 2012-10-23 MED ORDER — ACETAMINOPHEN 650 MG RE SUPP: 650.0000 mg | Freq: Four times a day (QID) | RECTAL | Status: DC | PRN

## 2012-10-23 MED ORDER — ALBUTEROL SULFATE HFA 108 (90 BASE) MCG/ACT IN AERS
2.0000 | INHALATION_SPRAY | RESPIRATORY_TRACT | Status: DC | PRN
  Filled 2012-10-23: qty 6.7

## 2012-10-23 MED ORDER — ENOXAPARIN SODIUM 30 MG/0.3ML ~~LOC~~ SOLN
30.0000 mg | SUBCUTANEOUS | Status: DC
  Administered 2012-10-23: 30 mg via SUBCUTANEOUS
  Filled 2012-10-23 (×2): qty 0.3

## 2012-10-23 MED ORDER — ALPRAZOLAM 0.25 MG PO TABS: 0.1250 mg | ORAL_TABLET | Freq: Three times a day (TID) | ORAL | Status: DC | PRN

## 2012-10-23 NOTE — ED Notes (Signed)
Pt from home c/o chest tightness. Pain in left chest, radiation to left neck, w/ nausea and generalized weakness beginning yesterday. Pt alert and oriented, no signs of distress.

## 2012-10-23 NOTE — ED Notes (Signed)
Patient transported to [facility] 

## 2012-10-23 NOTE — ED Notes (Addendum)
Per pt she began having upper left chest pain/ tightness and pressure yesterday morning.  Pt states the pressure feeling is going into he left neck and throat. Pt states pain isnt good description its just pressure. Pt has hx of stent. Pt has felt bad all week. Pt was given 324 aspirin by ems.  No other meds given. Pt alert oriented X4

## 2012-10-23 NOTE — ED Provider Notes (Signed)
CSN: [id number]     Arrival date & time 10/23/12  1313 History     First MD Initiated Contact with Patient 10/23/12 1425     Chief Complaint  Patient presents with  . Chest Pain   (Consider location/radiation/quality/duration/timing/severity/associated sxs/prior Treatment) HPI Comments: [name] is a 78yo woman with PMH of bradycardia, HTN, HLD, CHF (NOS), pulmonary HTN, and CAD.  She presents to ED today stating that she noticed gradual-onset, left-side CP that is described as pressure and radiating to left-shoulder and left arm/neck.  She states the pain has pretty much been persistent since yesterday and is worse with exertion.  She states she has not had a feeling like this before.  Nothing makes the pain better or worse.  She states "I just haven't felt well all week."  She denies any other significant related symptoms  Patient is a 78 y.o. female presenting with chest pain.  Chest Pain Associated symptoms: fatigue     Past Medical History  Diagnosis Date  . Depressive disorder, not elsewhere classified   . Anxiety state, unspecified   . Pure hypercholesterolemia   . Esophageal reflux   . Infection of esophagostomy   . Congestive heart failure, unspecified     diastolic heart failure  . Pulmonary hypertension     PA systolic pressure 55-65 mmHg by echocardiogram, PA pressure 33/10 by cardiac catheterization PA saturation 62% thermodilution cardiac index 2.0 thick cardiac index 2.4  . Symptomatic bradycardia     on beta blocker  . Tricuspid regurgitation     moderate tricuspid regurgitation by echocardiogram, prior use of anorexic agents  . Melanoma 1995    removal on back   . Coronary artery disease     Moderate to severe coronary disease involving the left anterior descending artery and right coronary artery.  Sequential stenosis in the LAD is significant.  Right coronary artery is moderate to severely likely nonischemic.,  Questionable small LVOT obstruction.  No  Brockenbrough maneuver was performed.  Catheterization January 2013  . PONV (postoperative nausea and vomiting)   . Hypertension   . Dysrhythmia   . H/O hiatal hernia   . History of percutaneous coronary intervention     Successful angioplasty and drug-eluting stent placed to an 80% lesion in the proximal LAD.,  Ostial lesion in the LAD was estimated to be 60% with an FFR ratio of 0.83, medical therapy was recommended for the latter lesion.  Dual antiplatelet therapy for one year.  Procedure performed May 08, 2011   Past Surgical History  Procedure Laterality Date  . Hiatal hernia repair  1993  . Rotator cuff repair  ~ 2001    right  . Coronary angioplasty with stent placement  05/08/11    "1"  . Tonsillectomy  ~ 1944  . Shoulder arthroscopy  ~ 2004; 2005    left; "joint's wore out"  . Cataract extraction w/ intraocular lens  implant, bilateral  ~ 2002  . Tubal ligation  1972   Family History  Problem Relation Age of Onset  . Heart attack Father    History  Substance Use Topics  . Smoking status: Former Smoker -- 1.00 packs/day for 20 years    Types: Cigarettes    Quit date: 04/26/1988  . Smokeless tobacco: Never Used  . Alcohol Use: No   OB History   Grav Para Term Preterm Abortions TAB SAB Ect Mult Living  Review of Systems  Constitutional: Positive for fatigue.  HENT: Negative.   Eyes: Negative.   Respiratory: Negative.   Cardiovascular: Positive for chest pain.  Gastrointestinal: Negative.   Endocrine: Negative.   Genitourinary: Negative.   Musculoskeletal: Negative.   Skin: Negative.   Allergic/Immunologic: Negative.   Neurological: Negative.   Hematological: Negative.   Psychiatric/Behavioral: Negative.   All other systems reviewed and are negative.    Allergies  Sulfamethoxazole  Home Medications   Current Outpatient Rx  Name  Route  Sig  Dispense  Refill  . albuterol (PROAIR HFA) 108 (90 BASE) MCG/ACT inhaler   Inhalation    Inhale 2 puffs into the lungs every 6 (six) hours as needed.         Marland Kitchen alendronate (FOSAMAX) 70 MG tablet   Oral   Take 70 mg by mouth every 7 (seven) days. Take with a full glass of water on an empty stomach on Mondays         . ALPRAZolam (XANAX) 0.25 MG tablet      0.25 mg 3 (three) times daily as needed for anxiety. Take 1/2 to 1 by mouth three times daily         . aspirin 81 MG chewable tablet   Oral   Chew 81 mg by mouth every evening.         . carvedilol (COREG) 3.125 MG tablet   Oral   Take 1 tablet (3.125 mg total) by mouth 2 (two) times daily with a meal.   60 tablet   6   . citalopram (CELEXA) 40 MG tablet   Oral   Take 0.5 tablets (20 mg total) by mouth daily.         Marland Kitchen esomeprazole (NEXIUM) 40 MG capsule   Oral   Take 40 mg by mouth daily before breakfast.         . ferrous sulfate 325 (65 FE) MG EC tablet   Oral   Take 325 mg by mouth daily with breakfast.           . furosemide (LASIX) 40 MG tablet   Oral   Take 40 mg by mouth daily.         Marland Kitchen losartan (COZAAR) 100 MG tablet   Oral   Take 100 mg by mouth daily.           . Multiple Vitamin (MULITIVITAMIN WITH MINERALS) TABS   Oral   Take 1 tablet by mouth daily. Centrum silver (equate)          . EXPIRED: nitroGLYCERIN (NITROSTAT) 0.4 MG SL tablet   Sublingual   Place 1 tablet (0.4 mg total) under the tongue every 5 (five) minutes as needed for chest pain.   25 tablet   3   . potassium chloride (KLOR-CON) 10 MEQ CR tablet   Oral   Take 10 mEq by mouth daily.           . rosuvastatin (CRESTOR) 10 MG tablet   Oral   Take 10 mg by mouth at bedtime.          . traMADol (ULTRAM) 50 MG tablet   Oral   Take 0.5 tablets (25 mg total) by mouth every 6 (six) hours as needed for pain.   15 tablet   0   . traZODone (DESYREL) 100 MG tablet   Oral   Take 100 mg by mouth at bedtime.           Marland Kitchen  Vitamin D, Ergocalciferol, (DRISDOL) 50000 UNITS CAPS   Oral   Take 50,000  Units by mouth every 7 (seven) days. Patient takes on Tuesdays         . zolpidem (AMBIEN) 10 MG tablet   Oral   Take 10 mg by mouth at bedtime.            BP 157/58  Pulse 44  Resp 14  SpO2 97% Physical Exam  Nursing note and vitals reviewed. Constitutional: She is oriented to person, place, and time. She appears well-developed and well-nourished.  HENT:  Head: Normocephalic and atraumatic.  Eyes: EOM are normal.  Neck: Normal range of motion. Neck supple. No JVD present.  Cardiovascular:  Bradycardic rate. nml rhythm. No sig m/r/g 2+ pulses throughout No sig LEE or calf TTP  Pulmonary/Chest: No respiratory distress. She has no wheezes. She has no rales. She exhibits no tenderness.  Abdominal: Soft. Bowel sounds are normal. There is no tenderness.  Genitourinary:  deferred  Musculoskeletal: Normal range of motion.  Neurological: She is alert and oriented to person, place, and time.  Skin: Skin is warm and dry.  Psychiatric: She has a normal mood and affect.    ED Course   Procedures (including critical care time)  EKG: sinus brady with rate 45.  PR ok.  QRS not prolonged.  No sig changes when compared to EKG from Sep 07, 2012.  Results for orders placed during the hospital encounter of 10/23/12  CBC      Result Value Range   WBC 4.6  4.0 - 10.5 K/uL   RBC 4.52  3.87 - 5.11 MIL/uL   Hemoglobin 12.3  12.0 - 15.0 g/dL   HCT 16.1  09.6 - 04.5 %   MCV 80.8  78.0 - 100.0 fL   MCH 27.2  26.0 - 34.0 pg   MCHC 33.7  30.0 - 36.0 g/dL   RDW 40.9  81.1 - 91.4 %   Platelets 121 (*) 150 - 400 K/uL  COMPREHENSIVE METABOLIC PANEL      Result Value Range   Sodium 140  135 - 145 mEq/L   Potassium 4.4  3.5 - 5.1 mEq/L   Chloride 101  96 - 112 mEq/L   CO2 30  19 - 32 mEq/L   Glucose, Bld 92  70 - 99 mg/dL   BUN 29 (*) 6 - 23 mg/dL   Creatinine, Ser 7.82 (*) 0.50 - 1.10 mg/dL   Calcium 95.6  8.4 - 21.3 mg/dL   Total Protein 6.6  6.0 - 8.3 g/dL   Albumin 3.6  3.5 - 5.2  g/dL   AST 13  0 - 37 U/L   ALT 6  0 - 35 U/L   Alkaline Phosphatase 60  39 - 117 U/L   Total Bilirubin 0.3  0.3 - 1.2 mg/dL   GFR calc non Af Amer 32 (*) >90 mL/min   GFR calc Af Amer 37 (*) >90 mL/min  URINALYSIS, ROUTINE W REFLEX MICROSCOPIC      Result Value Range   Color, Urine YELLOW  YELLOW   APPearance CLEAR  CLEAR   Specific Gravity, Urine 1.011  1.005 - 1.030   pH 6.0  5.0 - 8.0   Glucose, UA NEGATIVE  NEGATIVE mg/dL   Hgb urine dipstick NEGATIVE  NEGATIVE   Bilirubin Urine NEGATIVE  NEGATIVE   Ketones, ur NEGATIVE  NEGATIVE mg/dL   Protein, ur NEGATIVE  NEGATIVE mg/dL   Urobilinogen, UA 0.2  0.0 -  1.0 mg/dL   Nitrite NEGATIVE  NEGATIVE   Leukocytes, UA NEGATIVE  NEGATIVE  PRO B NATRIURETIC PEPTIDE      Result Value Range   Pro B Natriuretic peptide (BNP) 415.9  0 - 450 pg/mL  TSH      Result Value Range   TSH 3.494  0.350 - 4.500 uIU/mL  TROPONIN I      Result Value Range   Troponin I <0.30  <0.30 ng/mL  TROPONIN I      Result Value Range   Troponin I <0.30  <0.30 ng/mL  BASIC METABOLIC PANEL      Result Value Range   Sodium 140  135 - 145 mEq/L   Potassium 3.5  3.5 - 5.1 mEq/L   Chloride 102  96 - 112 mEq/L   CO2 29  19 - 32 mEq/L   Glucose, Bld 122 (*) 70 - 99 mg/dL   BUN 33 (*) 6 - 23 mg/dL   Creatinine, Ser 2.13 (*) 0.50 - 1.10 mg/dL   Calcium 9.5  8.4 - 08.6 mg/dL   GFR calc non Af Amer 30 (*) >90 mL/min   GFR calc Af Amer 35 (*) >90 mL/min  CBC      Result Value Range   WBC 4.2  4.0 - 10.5 K/uL   RBC 4.05  3.87 - 5.11 MIL/uL   Hemoglobin 10.9 (*) 12.0 - 15.0 g/dL   HCT 57.8 (*) 46.9 - 62.9 %   MCV 80.2  78.0 - 100.0 fL   MCH 26.9  26.0 - 34.0 pg   MCHC 33.5  30.0 - 36.0 g/dL   RDW 52.8  41.3 - 24.4 %   Platelets 108 (*) 150 - 400 K/uL  POCT I-STAT TROPONIN I      Result Value Range   Troponin i, poc 0.03  0.00 - 0.08 ng/mL   Comment 3            Dg Chest 2 View  10/23/2012   *RADIOLOGY REPORT*  Clinical Data: Chest pain  CHEST - 2 VIEW   Comparison: 09/06/2012  Findings: The cardiac shadow is stable.  A large hiatal hernia is again noted.  The lungs are clear bilaterally.  No acute bony abnormality is noted.  IMPRESSION: Large hiatal hernia.  No acute abnormality is seen.   Original Report Authenticated By: Alcide Clever, M.D.      MDM  Pt with hx of HLD, CHF (NOS), Pulmonary HTN, symptomatic bradycardia, and CAD presents with vague left-side chest pain which started yesterday.  She was admitted to the hospital last month for sepsis of unknown source, and since discharge has had an uneventful recovery.  She states she has "not felt right all week" but denies any other specific symptoms. Here vitals show HR upper 40s-50s.  Other vitals stable.  Exam with bradycardia but no other sig findings.  EKG shows no sig ischemic findings, only bradycardia c/w previous EKGs.  Labs show acute kidney injury with Cr > 50% rise over baseline. Due to risk factors, age, and complaint of chest pain, feel patient warrants inpatient workup for possible ACS.  Do not suspect PE, PNA, PTX, Ao dissection.  She has hiatal hernia on CXR; however, story is not typical for this being the cause of her symptoms at this time.  Doubt pericarditis/tamponade.  Will give nitrates, ASA and admit for further workup.  Regarding AKI, will defer further management to inpt team and avoid nephrotoxic meds.  Ashby Dawes, MD  10/24/12 0918 

## 2012-10-23 NOTE — H&P (Addendum)
[facility] History and Physical  [name] ZOX:[id number] DOB: [birth year] DOA: 10/23/2012  Referring physician: Dr Dan Humphreys PCP: Remus Loffler, PA-C  Specialists:   Chief Complaint: chest pressure  HPI: [name] is a 78 y.o. female with h/o CAD s/p stent, diastolic CHF, Afib, symptomatic brady on B blocker in the past, pulm HTN and multiple medical problems as listed below,  Last admitted in O6/2014 for cellulitis and Afib with RVR who presents with the above c/o. She states that she has L. Sided chest pressure x1day, 5/10 in intensity at its worst, associated with SOB, no radiation and it has been continuous since onset. She reports it radiates to her neck, admits to nausea but states she has it 'all the time', denies diaphoresis. She was seen in the ED and initially bradycardic at 37, EKG showed sinus brady at 45 with no acute ischemic changes and CXR neg for acute infiltrates-large hiatal hernia noted. She is admitted for further eval and mangement   Review of Systems: The patient denies anorexia, fever, weight loss, vision loss, decreased hearing, hoarseness, syncope, peripheral edema, balance deficits, hemoptysis, abdominal pain, melena, hematochezia, hematuria, incontinence, genital sores, muscle weakness, suspicious skin lesions, transient blindness, difficulty walking, depression, unusual weight change, abnormal bleeding, enlarged lymph nodes, angioedema, and breast masses.    Past Medical History  Diagnosis Date  . Depressive disorder, not elsewhere classified   . Anxiety state, unspecified   . Pure hypercholesterolemia   . Esophageal reflux   . Infection of esophagostomy   . Congestive heart failure, unspecified     diastolic heart failure  . Pulmonary hypertension     PA systolic pressure 55-65 mmHg by echocardiogram, PA pressure 33/10 by cardiac catheterization PA saturation 62% thermodilution cardiac index 2.0 thick cardiac index 2.4  . Symptomatic bradycardia     on  beta blocker  . Tricuspid regurgitation     moderate tricuspid regurgitation by echocardiogram, prior use of anorexic agents  . Melanoma 1995    removal on back   . Coronary artery disease     Moderate to severe coronary disease involving the left anterior descending artery and right coronary artery.  Sequential stenosis in the LAD is significant.  Right coronary artery is moderate to severely likely nonischemic.,  Questionable small LVOT obstruction.  No Brockenbrough maneuver was performed.  Catheterization January 2013  . PONV (postoperative nausea and vomiting)   . Hypertension   . Dysrhythmia   . H/O hiatal hernia   . History of percutaneous coronary intervention     Successful angioplasty and drug-eluting stent placed to an 80% lesion in the proximal LAD.,  Ostial lesion in the LAD was estimated to be 60% with an FFR ratio of 0.83, medical therapy was recommended for the latter lesion.  Dual antiplatelet therapy for one year.  Procedure performed May 08, 2011   Past Surgical History  Procedure Laterality Date  . Hiatal hernia repair  1993  . Rotator cuff repair  ~ 2001    right  . Coronary angioplasty with stent placement  05/08/11    "1"  . Tonsillectomy  ~ 1944  . Shoulder arthroscopy  ~ 2004; 2005    left; "joint's wore out"  . Cataract extraction w/ intraocular lens  implant, bilateral  ~ 2002  . Tubal ligation  1972   Social History:  reports that she quit smoking about 24 years ago. Her smoking use included Cigarettes. She has a 20 pack-year smoking history. She has  never used smokeless tobacco. She reports that she does not drink alcohol or use illicit drugs.  where does patient live--home  Can patient participate in ADLs-yes  Allergies  Allergen Reactions  . Sulfamethoxazole     REACTION: rash    Family History  Problem Relation Age of Onset  . Heart attack Father     Prior to Admission medications   Medication Sig Start Date End Date Taking? Authorizing  Provider  alendronate (FOSAMAX) 70 MG tablet Take 70 mg by mouth every 7 (seven) days. Take with a full glass of water on an empty stomach on Mondays   Yes Historical Provider, MD  ALPRAZolam (XANAX) 0.25 MG tablet 0.25 mg 3 (three) times daily as needed for anxiety. Take 1/2 to 1 by mouth three times daily   Yes Historical Provider, MD  aspirin 81 MG chewable tablet Chew 81 mg by mouth every evening.   Yes Historical Provider, MD  carvedilol (COREG) 3.125 MG tablet Take 3.125 mg by mouth 2 (two) times daily with a meal. 10/28/11 10/27/12 Yes June Leap, MD  citalopram (CELEXA) 40 MG tablet Take 0.5 tablets (20 mg total) by mouth daily. 09/12/12  Yes Nimish Normajean Glasgow, MD  esomeprazole (NEXIUM) 40 MG capsule Take 40 mg by mouth daily before breakfast.   Yes Historical Provider, MD  ferrous sulfate 325 (65 FE) MG EC tablet Take 325 mg by mouth daily with breakfast.     Yes Historical Provider, MD  furosemide (LASIX) 40 MG tablet Take 40 mg by mouth daily.   Yes Historical Provider, MD  losartan (COZAAR) 100 MG tablet Take 100 mg by mouth daily.     Yes Historical Provider, MD  potassium chloride (KLOR-CON) 10 MEQ CR tablet Take 10 mEq by mouth daily.     Yes Historical Provider, MD  Propylene Glycol (SYSTANE BALANCE OP) Place 1-2 drops into both eyes daily as needed (dry eyes).   Yes Historical Provider, MD  rosuvastatin (CRESTOR) 10 MG tablet Take 10 mg by mouth at bedtime.    Yes Historical Provider, MD  traZODone (DESYREL) 100 MG tablet Take 100 mg by mouth at bedtime.    Yes Historical Provider, MD  zolpidem (AMBIEN) 10 MG tablet Take 10 mg by mouth at bedtime.     Yes Historical Provider, MD  albuterol (PROAIR HFA) 108 (90 BASE) MCG/ACT inhaler Inhale 2 puffs into the lungs every 6 (six) hours as needed.    Historical Provider, MD  nitroGLYCERIN (NITROSTAT) 0.4 MG SL tablet Place 1 tablet (0.4 mg total) under the tongue every 5 (five) minutes as needed for chest pain. 04/29/11 09/05/12  June Leap, MD  traMADol (ULTRAM) 50 MG tablet Take 0.5 tablets (25 mg total) by mouth every 6 (six) hours as needed for pain. 09/05/12   Celene Kras, MD  Vitamin D, Ergocalciferol, (DRISDOL) 50000 UNITS CAPS Take 50,000 Units by mouth every 7 (seven) days. Patient takes on Tuesdays    Historical Provider, MD   Physical Exam: Filed Vitals:   10/23/12 1600 10/23/12 1630 10/23/12 1730 10/23/12 1800  BP: 130/55 129/58 131/80 137/83  Pulse: 50 38 52 55  Temp:    98.2 F (36.8 C)  TempSrc:    Oral  Resp:    16  Height:    5\' 2"  (1.575 m)  Weight:    66.361 kg (146 lb 4.8 oz)  SpO2: 99% 99% 100% 98%    Constitutional: Vital signs reviewed.  Patient is  a well-developed and well-nourished  in no acute distress and cooperative with exam. Alert and oriented x3.  Head: Normocephalic and atraumatic Mouth: no erythema or exudates, MMM Eyes: PERRL, EOMI, conjunctivae normal, No scleral icterus.  Neck: Supple, Trachea midline normal ROM, No JVD, mass, thyromegaly, or carotid bruit present.  Cardiovascular: brady, regular, S1 normal, S2 normal, no MRG, pulses symmetric and intact bilaterally Pulmonary/Chest: normal respiratory effort, CTAB, no wheezes, rales, or rhonchi Abdominal: Soft. Non-tender, non-distended, bowel sounds are normal, no masses, organomegaly, or guarding present.  GU: no CVA tenderness Extremities: no cyanosis and no edema Neurological: A&O x3, Strength is normal and symmetric bilaterally, cranial nerve II-XII are grossly intact, no focal motor deficit, sensory intact to light touch bilaterally.  Skin: Warm, dry and intact. No rash, cyanosis.  Psychiatric: Normal mood and affect. speech and behavior is normal.   Labs on Admission:  Basic Metabolic Panel:  Recent Labs Lab 10/23/12 1420  NA 140  K 4.4  CL 101  CO2 30  GLUCOSE 92  BUN 29*  CREATININE 1.53*  CALCIUM 10.3   Liver Function Tests:  Recent Labs Lab 10/23/12 1420  AST 13  ALT 6  ALKPHOS 60  BILITOT 0.3   PROT 6.6  ALBUMIN 3.6   No results found for this basename: LIPASE, AMYLASE,  in the last 168 hours No results found for this basename: AMMONIA,  in the last 168 hours CBC:  Recent Labs Lab 10/23/12 1420  WBC 4.6  HGB 12.3  HCT 36.5  MCV 80.8  PLT 121*   Cardiac Enzymes: No results found for this basename: CKTOTAL, CKMB, CKMBINDEX, TROPONINI,  in the last 168 hours  BNP (last 3 results)  Recent Labs  10/23/12 1420  PROBNP 415.9   CBG: No results found for this basename: GLUCAP,  in the last 168 hours  Radiological Exams on Admission: Dg Chest 2 View  10/23/2012   *RADIOLOGY REPORT*  Clinical Data: Chest pain  CHEST - 2 VIEW  Comparison: 09/06/2012  Findings: The cardiac shadow is stable.  A large hiatal hernia is again noted.  The lungs are clear bilaterally.  No acute bony abnormality is noted.  IMPRESSION: Large hiatal hernia.  No acute abnormality is seen.   Original Report Authenticated By: Alcide Clever, M.D.    EKG: EKG: sinus brady with rate 45, no acute ischemic changes   Assessment/Plan Active Problems: Chest pain -as discussed above in pt with CAD s/p stent in past -continue ASA, prn NTG -cycle cardiac enzymes, follow consult cards- she goes to [facility] in [place] -bradycardia may be contributing>> will hold coreg and follow Sinus bradycardia -as above, hold coreg, monitor on tele -follow and consult cards for further recs   Acute kidney injury -last cr in 08/2012 was about 1, will hold lasix and ARB for now>>cautious fluids and follow   DIASTOLIC DYSFUNCTION -compensated, monitor fluid status closely with lasix on hold due to AKI, follow and resume when appropriate   GERD -continue PPI   HTN (hypertension) -Holding losartan and coreg as above monitor BP   Coronary artery disease -outpt meds as above, follow troponins   Atrial fibrillation    -monitor off rate control meds due to brady as above and further treat accordingly    Code Status:  full Family Communication: 2 daughters at bedside Disposition Plan: admit to tele for obsv  Time spent: >30  Kela Millin [facility] Pager [phone]  If 7PM-7AM, please contact night-coverage [email or web address] Password [facility] 10/23/2012, 7:12  PM       

## 2012-10-24 DIAGNOSIS — I4891 Unspecified atrial fibrillation: Secondary | ICD-10-CM

## 2012-10-24 DIAGNOSIS — I279 Pulmonary heart disease, unspecified: Secondary | ICD-10-CM

## 2012-10-24 DIAGNOSIS — I369 Nonrheumatic tricuspid valve disorder, unspecified: Secondary | ICD-10-CM

## 2012-10-24 LAB — TROPONIN I
Troponin I: <0.3 ng/mL (ref <0.30)
Troponin I: <0.3 ng/mL (ref <0.30)

## 2012-10-24 LAB — CBC
HCT: 32.5 % — ABNORMAL LOW (ref 36.0–46.0)
RBC: 4.05 MIL/uL (ref 3.87–5.11)
RDW: 14.7 % (ref 11.5–15.5)
WBC: 4.2 10*3/uL (ref 4.0–10.5)

## 2012-10-24 LAB — BASIC METABOLIC PANEL
Chloride: 102 mEq/L (ref 96–112)
Creatinine, Ser: 1.61 mg/dL — ABNORMAL HIGH (ref 0.50–1.10)
GFR calc Af Amer: 35 mL/min — ABNORMAL LOW (ref >90)
Potassium: 3.5 mEq/L (ref 3.5–5.1)
Sodium: 140 mEq/L (ref 135–145)

## 2012-10-24 NOTE — Progress Notes (Signed)
Patient ID: [name], female   DOB: 03/18/1935, 78 y.o.   MRN: [id number]   CARDIOLOGY CONSULT NOTE  Patient ID: [name] MRN: [id number], DOB/AGE: Jun 05, 1934   Admit date: 10/23/2012 Date of Consult: 10/24/2012   Primary Physician: Remus Loffler, PA-C Primary Cardiologist: New  Pt. Profile  78 year old white female with history of symptomatic bradycardia presents with worsening bradycardia. Asked by Dr. Suanne Marker to consult on need for pacemaker.  Problem List  Past Medical History  Diagnosis Date  . Depressive disorder, not elsewhere classified   . Anxiety state, unspecified   . Pure hypercholesterolemia   . Esophageal reflux   . Infection of esophagostomy   . Congestive heart failure, unspecified     diastolic heart failure  . Pulmonary hypertension     PA systolic pressure 55-65 mmHg by echocardiogram, PA pressure 33/10 by cardiac catheterization PA saturation 62% thermodilution cardiac index 2.0 thick cardiac index 2.4  . Symptomatic bradycardia     on beta blocker  . Tricuspid regurgitation     moderate tricuspid regurgitation by echocardiogram, prior use of anorexic agents  . Melanoma 1995    removal on back   . Coronary artery disease     Moderate to severe coronary disease involving the left anterior descending artery and right coronary artery.  Sequential stenosis in the LAD is significant.  Right coronary artery is moderate to severely likely nonischemic.,  Questionable small LVOT obstruction.  No Brockenbrough maneuver was performed.  Catheterization January 2013  . PONV (postoperative nausea and vomiting)   . Hypertension   . Dysrhythmia   . H/O hiatal hernia   . History of percutaneous coronary intervention     Successful angioplasty and drug-eluting stent placed to an 80% lesion in the proximal LAD.,  Ostial lesion in the LAD was estimated to be 60% with an FFR ratio of 0.83, medical therapy was recommended for the latter lesion.  Dual antiplatelet therapy for  one year.  Procedure performed May 08, 2011    Past Surgical History  Procedure Laterality Date  . Hiatal hernia repair  1993  . Rotator cuff repair  ~ 2001    right  . Coronary angioplasty with stent placement  05/08/11    "1"  . Tonsillectomy  ~ 1944  . Shoulder arthroscopy  ~ 2004; 2005    left; "joint's wore out"  . Cataract extraction w/ intraocular lens  implant, bilateral  ~ 2002  . Tubal ligation  1972     Allergies  Allergies  Allergen Reactions  . Sulfamethoxazole     REACTION: rash    HPI   Delightful 78 year old white female who began to have extreme fatigue and weakness about a week ago. She then developed a uneasy feeling in her chest with no other associated symptoms. She has had a couple episodes of diaphoresis but not associated with chest pain. She denies any syncope.  She was last admitted with a progressive cellulitis resulting in sepsis, acute renal failure, and paroxysmal A. fib with a rate of 150 beats per minute. It was the first time she had atrial fibrillation. She has a long cardiac history as outlined in the past medical history.  She was placed on carvedilol 3.125 after that discharge. She had been on this prior to admission with heart rates in the 50s and sinus bradycardia. On this admission she presented with a heart rate of 37 beats per minute. I cannot find EKG from the ED today but she  heard by the admitting team this was the Hubert. Carvedilol was held and she is now 60 beats per minute and in sinus rhythm. Cardiac markers are negative.  Inpatient Medications  . aspirin  81 mg Oral QPM  . atorvastatin  20 mg Oral q1800  . citalopram  20 mg Oral Daily  . enoxaparin (LOVENOX) injection  30 mg Subcutaneous Q24H  . ferrous sulfate  325 mg Oral Q breakfast  . pantoprazole  40 mg Oral BID AC  . sodium chloride  3 mL Intravenous Q12H  . traZODone  100 mg Oral QHS    Family History Family History  Problem Relation Age of Onset  . Heart attack  Father      Social History History   Social History  . Marital Status: Divorced    Spouse Name: N/A    Number of Children: N/A  . Years of Education: N/A   Occupational History  . Not on file.   Social History Main Topics  . Smoking status: Former Smoker -- 1.00 packs/day for 20 years    Types: Cigarettes    Quit date: 04/26/1988  . Smokeless tobacco: Never Used  . Alcohol Use: No  . Drug Use: No  . Sexually Active: No   Other Topics Concern  . Not on file   Social History Narrative  . No narrative on file     Review of Systems  General:  No chills, fever, night sweats or weight changes.  Cardiovascular:  No  dyspnea on exertion, edema, orthopnea, palpitations, paroxysmal nocturnal dyspnea. Dermatological: No rash, lesions/masses Respiratory: No cough, dyspnea Urologic: No hematuria, dysuria Abdominal:   No nausea, vomiting, diarrhea, bright red blood per rectum, melena, or hematemesis Neurologic:  No visual changes, wkns, changes in mental status. All other systems reviewed and are otherwise negative except as noted above.  Physical Exam  Blood pressure 121/68, pulse 52, temperature 98.1 F (36.7 C), temperature source Oral, resp. rate 18, height 5\' 2"  (1.575 m), weight 146 lb 4.8 oz (66.361 kg), SpO2 97.00%.  General: Pleasant, NAD Psych: Normal affect. Neuro: Alert and oriented X 3. Moves all extremities spontaneously. HEENT: Normal  Neck: Supple without bruits or JVD. Lungs:  Resp regular and unlabored, CTA. Heart: RRR no s3, s4, or murmurs. Abdomen: Soft, non-tender, non-distended, BS + x 4.  Extremities: No clubbing, cyanosis or edema. DP/PT/Radials 2+ and equal bilaterally.  Labs   Recent Labs  10/23/12 2015 10/24/12 0225 10/24/12 0746  TROPONINI <0.30 <0.30 <0.30   Lab Results  Component Value Date   WBC 4.2 10/24/2012   HGB 10.9* 10/24/2012   HCT 32.5* 10/24/2012   MCV 80.2 10/24/2012   PLT 108* 10/24/2012    Recent Labs Lab  10/23/12 1420 10/24/12 0225  NA 140 140  K 4.4 3.5  CL 101 102  CO2 30 29  BUN 29* 33*  CREATININE 1.53* 1.61*  CALCIUM 10.3 9.5  PROT 6.6  --   BILITOT 0.3  --   ALKPHOS 60  --   ALT 6  --   AST 13  --   GLUCOSE 92 122*   No results found for this basename: CHOL, HDL, LDLCALC, TRIG   No results found for this basename: DDIMER    Radiology/Studies  Dg Chest 2 View  10/23/2012   *RADIOLOGY REPORT*  Clinical Data: Chest pain  CHEST - 2 VIEW  Comparison: 09/06/2012  Findings: The cardiac shadow is stable.  A large hiatal hernia is again noted.  The lungs are clear bilaterally.  No acute bony abnormality is noted.  IMPRESSION: Large hiatal hernia.  No acute abnormality is seen.   Original Report Authenticated By: Alcide Clever, M.D.    ECG    ASSESSMENT AND PLAN #1 symptomatic bradycardia on a very low dose of carvedilol.  #2 history of paroxysmal atrial fibrillation with a rapid ventricular rate in the setting of sepsis.  #3 history of coronary artery disease and PCI of LAD  #4 history of chronic diastolic dysfunction  #5 pulmonary hypertension with moderate to severe tricuspid regurgitation  I discussed the [place] with our electrophysiologist Dr. Graciela Husbands. We'll discharge her home without carvedilol. I will schedule a BP evaluation and followup in [place] with Dr. Johney Frame. We'll continue aspirin for now but may need to consider anticoagulation in the future. Dr. Graciela Husbands feels she needs to clear herself having recurrent atrial fib, not in the setting of an acute illness as she did in June, before declaring she needs a pacemaker. I've discussed this with the patient and the admitting team. I'll make the appointment. All are comfortable with this plan.   Signed, Valera Castle, MD 10/24/2012, 12:02 PM

## 2012-10-24 NOTE — Discharge Summary (Signed)
Physician Discharge Summary  [name] ZOX:[id number] DOB: [birth year] DOA: 10/23/2012  PCP: Remus Loffler, PA-C  Admit date: 10/23/2012 Discharge date: 10/24/2012  Time spent: >30 minutes  Recommendations for Outpatient Follow-up:      Follow-up Information   Follow up with Remus Loffler, PA-C. (next week Mon or Tues to have Bmet f/u Cr. and BP check)    Contact information:   [address] [place] [address] [phone]       Please follow up. Corinda Gubler Cardiologist/Dr Allred, in [place] as directed)       Discharge Diagnoses:  Active Problems:   DIASTOLIC DYSFUNCTION   GERD   HTN (hypertension)   Sinus bradycardia   Coronary artery disease   Atrial fibrillation with RVR   Acute renal failure   Chest pain   Acute kidney injury   Discharge Condition: Improved/stable  Diet recommendation: HEART healthy  Filed Weights   10/23/12 1800  Weight: 66.361 kg (146 lb 4.8 oz)    History of present illness:  [name] is a 78 y.o. female with h/o CAD s/p stent, diastolic CHF, Afib, symptomatic brady on B blocker in the past, pulm HTN and multiple medical problems as listed below, Last admitted in O6/2014 for cellulitis and Afib with RVR who presents with the above c/o. She states that she has L. Sided chest pressure x1day, 5/10 in intensity at its worst, associated with SOB, no radiation and it has been continuous since onset. She reports it radiates to her neck, admits to nausea but states she has it 'all the time', denies diaphoresis. She was seen in the ED and initially bradycardic at 37, EKG showed sinus brady at 45 with no acute ischemic changes and CXR neg for acute infiltrates-large hiatal hernia noted. She is admitted for further eval and mangement   Hospital Course:  Active Problems:  Chest pain  -as discussed above in pt with CAD s/p stent in past  -continue ASA, prn NTG  -cardiac enzymes, were cycled and came back negative  -her CP resolved on follow up -the  impresssion was that bradycardia was contributing>> her coreg was held and this improved. Cariology was consulted and saw pt and recommended to keep her off coreg and have her follow up outpt with cardiologist in [place]. Sinus bradycardia  -as above, coreg was held, Hr improved on follow up. -cards saw pt for further recs, and after discussing pt with electrophysiolgist recommended to d/c her home off carvedilol and she was to follow  Up with Dr Johney Frame in [place] -Pt was instructed to D/C THE COReg  As above. Acute kidney injury  -last cr in 08/2012 was about 1,  Lasix was held and ARB for now>>cautious fluids. On follow her Cr was trending up and she ws intructed to hold off losartan and lasix an follow up with her PCP the next week for follow up and recheck of her renal function and further management as appropriate. DIASTOLIC DYSFUNCTION  -compensated, monitor fluid status closely with lasix on hold due to AKI, follow and resume when appropriate  GERD  -continue PPI  HTN (hypertension)  -Holding losartan and coreg as above, follow up with PCP  Coronary artery disease  -outpt meds as above, troponins neg  Atrial fibrillation  -Hr/brady improved off coreg as above, no tachy as dicussed above per cards she is to follow up oupt and antcoagulation to be considered in the future.    Procedures:  none  Consultations:  Cardiology-Dr.  Wall  Discharge Exam: Filed Vitals:   10/23/12 1800 10/23/12 2052 10/24/12 0604 10/24/12 1300  BP: 137/83 116/67 121/68 134/69  Pulse: 55 60 52 62  Temp: 98.2 F (36.8 C) 97.9 F (36.6 C) 98.1 F (36.7 C) 98.2 F (36.8 C)  TempSrc: Oral Oral Oral Oral  Resp: 16 18 18 18   Height: 5\' 2"  (1.575 m)     Weight: 66.361 kg (146 lb 4.8 oz)     SpO2: 98% 98% 97% 98%   In Gen: alert and oriented x3 in NAD  Cardiovascular: brady, regular, S1 normal, S2 normal, no MRG, pulses symmetric and intact bilaterally  Pulmonary/Chest: normal respiratory effort, CTAB, no  wheezes, rales, or rhonchi  Abdominal: Soft. Non-tender, non-distended, bowel sounds are normal, no masses, organomegaly, or guarding present.  Extremities: no cyanosis and no edema     Discharge Instructions  Discharge Orders   Future Appointments Provider Department Dept Phone   11/11/2012 1:00 PM Laqueta Linden, MD [facility] (near [place]) [phone]   Future Orders Complete By Expires     Diet - low sodium heart healthy  As directed     Discharge instructions  As directed     Comments:      STOP COREG.  Only Hold off Losartan and Lasix till follow up with PCP Early this next week, then further directions per PCP    Increase activity slowly  As directed         Medication List    STOP taking these medications carvedilol 3.125 MG tablet  Commonly known as:  COREG  Take 3.125 mg by mouth 2 (two) times daily with a meal.        furosemide 40 MG tablet  Commonly known as:  LASIX     losartan 100 MG tablet  Commonly known as:  COZAAR     nitroGLYCERIN 0.4 MG SL tablet  Commonly known as:  NITROSTAT     potassium chloride 10 MEQ CR tablet  Commonly known as:  KLOR-CON      TAKE these medications       alendronate 70 MG tablet  Commonly known as:  FOSAMAX  Take 70 mg by mouth every 7 (seven) days. Take with a full glass of water on an empty stomach on Mondays     ALPRAZolam 0.25 MG tablet  Commonly known as:  XANAX  0.25 mg 3 (three) times daily as needed for anxiety. Take 1/2 to 1 by mouth three times daily     aspirin 81 MG chewable tablet  Chew 81 mg by mouth every evening.        citalopram 40 MG tablet  Commonly known as:  CELEXA  Take 0.5 tablets (20 mg total) by mouth daily.     esomeprazole 40 MG capsule  Commonly known as:  NEXIUM  Take 40 mg by mouth daily before breakfast.     ferrous sulfate 325 (65 FE) MG EC tablet  Take 325 mg by mouth daily with breakfast.     PROAIR HFA 108 (90 BASE) MCG/ACT inhaler  Generic drug:   albuterol  Inhale 2 puffs into the lungs every 6 (six) hours as needed.     rosuvastatin 10 MG tablet  Commonly known as:  CRESTOR  Take 10 mg by mouth at bedtime.     SYSTANE BALANCE OP  Place 1-2 drops into both eyes daily as needed (dry eyes).     traMADol 50 MG tablet  Commonly known as:  ULTRAM  Take 0.5 tablets (25 mg total) by mouth every 6 (six) hours as needed for pain.     traZODone 100 MG tablet  Commonly known as:  DESYREL  Take 100 mg by mouth at bedtime.     Vitamin D (Ergocalciferol) 50000 UNITS Caps  Commonly known as:  DRISDOL  Take 50,000 Units by mouth every 7 (seven) days. Patient takes on Tuesdays     zolpidem 10 MG tablet  Commonly known as:  AMBIEN  Take 10 mg by mouth at bedtime.       Allergies  Allergen Reactions  . Sulfamethoxazole     REACTION: rash       Follow-up Information   Follow up with Remus Loffler, PA-C. (next week Mon or Tues to have Bmet f/u Cr. and BP check)    Contact information:   [address] [place] [address] [phone]       Please follow up. Corinda Gubler Cardiologist/Dr Allred, in [place] as directed)        The results of significant diagnostics from this hospitalization (including imaging, microbiology, ancillary and laboratory) are listed below for reference.    Significant Diagnostic Studies: Dg Chest 2 View  10/23/2012   *RADIOLOGY REPORT*  Clinical Data: Chest pain  CHEST - 2 VIEW  Comparison: 09/06/2012  Findings: The cardiac shadow is stable.  A large hiatal hernia is again noted.  The lungs are clear bilaterally.  No acute bony abnormality is noted.  IMPRESSION: Large hiatal hernia.  No acute abnormality is seen.   Original Report Authenticated By: Alcide Clever, M.D.    Microbiology: No results found for this or any previous visit (from the past 240 hour(s)).   Labs: Basic Metabolic Panel:  Recent Labs Lab 10/23/12 1420 10/24/12 0225  NA 140 140  K 4.4 3.5  CL 101 102  CO2 30 29  GLUCOSE 92  122*  BUN 29* 33*  CREATININE 1.53* 1.61*  CALCIUM 10.3 9.5   Liver Function Tests:  Recent Labs Lab 10/23/12 1420  AST 13  ALT 6  ALKPHOS 60  BILITOT 0.3  PROT 6.6  ALBUMIN 3.6   No results found for this basename: LIPASE, AMYLASE,  in the last 168 hours No results found for this basename: AMMONIA,  in the last 168 hours CBC:  Recent Labs Lab 10/23/12 1420 10/24/12 0225  WBC 4.6 4.2  HGB 12.3 10.9*  HCT 36.5 32.5*  MCV 80.8 80.2  PLT 121* 108*   Cardiac Enzymes:  Recent Labs Lab 10/23/12 2015 10/24/12 0225 10/24/12 0746  TROPONINI <0.30 <0.30 <0.30   BNP: BNP (last 3 results)  Recent Labs  10/23/12 1420  PROBNP 415.9   CBG: No results found for this basename: GLUCAP,  in the last 168 hours     Signed:  Vianka Ertel C  [facility] 10/24/2012, 3:12 PM

## 2012-11-11 ENCOUNTER — Ambulatory Visit (INDEPENDENT_AMBULATORY_CARE_PROVIDER_SITE_OTHER): Payer: Medicare Other | Admitting: Cardiovascular Disease

## 2012-11-11 ENCOUNTER — Encounter: Payer: Self-pay | Admitting: Cardiovascular Disease

## 2012-11-11 ENCOUNTER — Other Ambulatory Visit: Payer: Self-pay | Admitting: *Deleted

## 2012-11-11 VITALS — BP 188/77 | HR 60 | Ht 62.0 in | Wt 151.0 lb

## 2012-11-11 DIAGNOSIS — I4891 Unspecified atrial fibrillation: Secondary | ICD-10-CM

## 2012-11-11 DIAGNOSIS — R002 Palpitations: Secondary | ICD-10-CM

## 2012-11-11 DIAGNOSIS — I2510 Atherosclerotic heart disease of native coronary artery without angina pectoris: Secondary | ICD-10-CM

## 2012-11-11 DIAGNOSIS — I079 Rheumatic tricuspid valve disease, unspecified: Secondary | ICD-10-CM

## 2012-11-11 DIAGNOSIS — I071 Rheumatic tricuspid insufficiency: Secondary | ICD-10-CM

## 2012-11-11 DIAGNOSIS — Z9889 Other specified postprocedural states: Secondary | ICD-10-CM

## 2012-11-11 DIAGNOSIS — I2789 Other specified pulmonary heart diseases: Secondary | ICD-10-CM

## 2012-11-11 DIAGNOSIS — Z9861 Coronary angioplasty status: Secondary | ICD-10-CM

## 2012-11-11 DIAGNOSIS — I272 Pulmonary hypertension, unspecified: Secondary | ICD-10-CM

## 2012-11-11 DIAGNOSIS — I498 Other specified cardiac arrhythmias: Secondary | ICD-10-CM

## 2012-11-11 DIAGNOSIS — R001 Bradycardia, unspecified: Secondary | ICD-10-CM

## 2012-11-11 DIAGNOSIS — I10 Essential (primary) hypertension: Secondary | ICD-10-CM

## 2012-11-11 MED ORDER — LOSARTAN POTASSIUM 100 MG PO TABS: 100.0000 mg | ORAL_TABLET | Freq: Every day | ORAL | Status: DC

## 2012-11-11 NOTE — Patient Instructions (Addendum)
   Restart Losartan 100mg  daily - new sent to pharm  E-cardio 30 day heart monitor - will be mailed to your home Office will contact with results via phone or letter.    Continue all other medications.   Follow up in  6 weeks

## 2012-11-11 NOTE — Progress Notes (Signed)
Patient ID: MERLE "Jennefer" I Lesnick, female   DOB: 14-Jun-1934, 78 y.o.   MRN: [id number]    SUBJECTIVE:  Mrs. Lakey has a history of coronary disease status post stent to the LAD and an ostial lesion that was felt not to be flow-limiting. She also has paroxysmal atrial fibrillation (with RVR in the setting of sepsis), symptomatic bradycardia (Carvedilol recently d/c during recent hospitalization), chronic diastolic dysfunction, and pulmonary hypertension (mild TR in June 2014, had been moderate to severe). She also had some renal insufficiency and her creatinine was 1.61 on 7/26 and her Losartan and Lasix were also held. She may have been dehydrated. She had been on Amlodipine in the past but it caused her leg swelling.  She denies any chest pain, orthopnea, and PND. She did have a sensation of a rapid heart beat in her neck last night. She does get short of breath with exertion while lifting heavy objects.  The patient exercises several times a week and does yoga and pilates, as well as Silver sneakers.   She has 8 grandchildren and likes to stay active, attending all their activities.  She's been checking her HR and it ranges from 50's-75 bpm. Her BP has been ranging from 180-190/70 mmHg.  Her BUN was most recently 17 and her creatinine was 1.02 on 10-27-2012.  Past Medical History   Diagnosis  Date   .  Depressive disorder, not elsewhere classified    .  Anxiety state, unspecified    .  Pure hypercholesterolemia    .  Esophageal reflux    .  Infection of esophagostomy    .  Congestive heart failure, unspecified      diastolic heart failure   .  Pulmonary hypertension      PA systolic pressure 55-65 mmHg by echocardiogram, PA pressure 33/10 by cardiac catheterization PA saturation 62% thermodilution cardiac index 2.0 thick cardiac index 2.4   .  Symptomatic bradycardia      on beta blocker   .  Tricuspid regurgitation      moderate tricuspid regurgitation by echocardiogram, prior use of  anorexic agents   .  Melanoma  1995     removal on back   .  Coronary artery disease      Moderate to severe coronary disease involving the left anterior descending artery and right coronary artery. Sequential stenosis in the LAD is significant. Right coronary artery is moderate to severely likely nonischemic., Questionable small LVOT obstruction. No Brockenbrough maneuver was performed. Catheterization January 2013   .  PONV (postoperative nausea and vomiting)    .  Hypertension    .  Dysrhythmia    .  H/O hiatal hernia    .  History of percutaneous coronary intervention      Successful angioplasty and drug-eluting stent placed to an 80% lesion in the proximal LAD., Ostial lesion in the LAD was estimated to be 60% with an FFR ratio of 0.83, medical therapy was recommended for the latter lesion. Dual antiplatelet therapy for one year. Procedure performed May 08, 2011    Past Surgical History   Procedure  Laterality  Date   .  Hiatal hernia repair   1993   .  Rotator cuff repair   ~ 2001     right   .  Coronary angioplasty with stent placement   05/08/11     "1"   .  Tonsillectomy   ~ 1944   .  Shoulder arthroscopy   ~ 2004;  2005     left; "joint's wore out"   .  Cataract extraction w/ intraocular lens implant, bilateral   ~ 2002   .  Tubal ligation   1972        Filed Vitals:   11/11/12 1258  BP: 188/77  Pulse: 60  Height: 5\' 2"  (1.575 m)  Weight: 151 lb (68.493 kg)     PHYSICAL EXAM General: NAD Neck: No JVD, no thyromegaly or thyroid nodule.  Lungs: Clear to auscultation bilaterally with normal respiratory effort. CV: Nondisplaced PMI.  Heart regular S1/S2, no S3/S4, no murmur.  No peripheral edema.  No carotid bruit.  Normal pedal pulses.  Abdomen: Soft, nontender, no hepatosplenomegaly, no distention.  Neurologic: Alert and oriented x 3.  Psych: Normal affect. Extremities: No clubbing or cyanosis.     LABS: Basic Metabolic Panel: No results found for this  basename: NA, K, CL, CO2, GLUCOSE, BUN, CREATININE, CALCIUM, MG, PHOS,  in the last 72 hours Liver Function Tests: No results found for this basename: AST, ALT, ALKPHOS, BILITOT, PROT, ALBUMIN,  in the last 72 hours No results found for this basename: LIPASE, AMYLASE,  in the last 72 hours CBC: No results found for this basename: WBC, NEUTROABS, HGB, HCT, MCV, PLT,  in the last 72 hours Cardiac Enzymes: No results found for this basename: CKTOTAL, CKMB, CKMBINDEX, TROPONINI,  in the last 72 hours BNP: No components found with this basename: POCBNP,  D-Dimer: No results found for this basename: DDIMER,  in the last 72 hours Hemoglobin A1C: No results found for this basename: HGBA1C,  in the last 72 hours Fasting Lipid Panel: No results found for this basename: CHOL, HDL, LDLCALC, TRIG, CHOLHDL, LDLDIRECT,  in the last 72 hours Thyroid Function Tests: No results found for this basename: TSH, T4TOTAL, FREET3, T3FREE, THYROIDAB,  in the last 72 hours Anemia Panel: No results found for this basename: VITAMINB12, FOLATE, FERRITIN, TIBC, IRON, RETICCTPCT,  in the last 72 hours  RADIOLOGY: Dg Chest 2 View  10/23/2012   *RADIOLOGY REPORT*  Clinical Data: Chest pain  CHEST - 2 VIEW  Comparison: 09/06/2012  Findings: The cardiac shadow is stable.  A large hiatal hernia is again noted.  The lungs are clear bilaterally.  No acute bony abnormality is noted.  IMPRESSION: Large hiatal hernia.  No acute abnormality is seen.   Original Report Authenticated By: Alcide Clever, M.D.    ECHO (June 2014): - Left ventricle: The cavity size was normal. Wall thickness was normal. Systolic function was normal. The estimated ejection fraction was in the range of 55% to 60%. - Aortic valve: Mildly calcified annulus. Trileaflet; mildly calcified leaflets. - Mitral valve: Calcified annulus. - Left atrium: The atrium was mildly to moderately dilated. - Atrial septum: No defect or patent foramen ovale  was identified. - Pulmonary arteries: PA peak pressure: 43mm Hg (S).    ASSESSMENT AND PLAN: 1. CAD s/p LAD stent: continue ASA. No beta blockers due to symptomatic bradycardia. 2. HTN: uncontrolled. It appears renal function has improved considerably. I will restart Losartan at 100 mg daily, and have her check it at home and inform me of these values. 3. Symptomatic bradycardia/Paroxysmal atrial fibrillation: will have her an event monitor for 30 days to see if she's experiencing any tachy-brady arrhythmias. If so, a pacemaker will be warranted. 4. Pulm HTN: now only mild TR, stable. 5. Chronic diastolic dysfunction: no need for Lasix at present. Need to get BP under optimal control to avoid decompensation.  6. Paroxysmal atrial fibrillation: if she indeed has recurrences and we can document this, will need to consider anticoagulation, and I discussed this with the patient.   Prentice Docker, M.D., F.A.C.C.

## 2012-11-13 DIAGNOSIS — I498 Other specified cardiac arrhythmias: Secondary | ICD-10-CM

## 2012-11-13 DIAGNOSIS — R002 Palpitations: Secondary | ICD-10-CM

## 2012-11-13 DIAGNOSIS — I4891 Unspecified atrial fibrillation: Secondary | ICD-10-CM

## 2012-12-02 ENCOUNTER — Ambulatory Visit: Payer: Medicare Other | Admitting: Internal Medicine

## 2012-12-10 ENCOUNTER — Encounter: Payer: Self-pay | Admitting: Internal Medicine

## 2012-12-10 ENCOUNTER — Ambulatory Visit (INDEPENDENT_AMBULATORY_CARE_PROVIDER_SITE_OTHER): Payer: Medicare Other | Admitting: Internal Medicine

## 2012-12-10 VITALS — BP 150/85 | HR 85 | Ht 62.0 in | Wt 152.0 lb

## 2012-12-10 DIAGNOSIS — I2789 Other specified pulmonary heart diseases: Secondary | ICD-10-CM

## 2012-12-10 DIAGNOSIS — I498 Other specified cardiac arrhythmias: Secondary | ICD-10-CM

## 2012-12-10 DIAGNOSIS — I272 Pulmonary hypertension, unspecified: Secondary | ICD-10-CM

## 2012-12-10 DIAGNOSIS — I4891 Unspecified atrial fibrillation: Secondary | ICD-10-CM

## 2012-12-10 DIAGNOSIS — R001 Bradycardia, unspecified: Secondary | ICD-10-CM

## 2012-12-10 NOTE — Patient Instructions (Signed)
Continue all current medications. Follow up as needed with Dr. Johney Frame

## 2012-12-10 NOTE — Progress Notes (Signed)
Primary Care Physician: Caryl Never Referring Physician:  Dr Daleen Squibb Primary Cardiologist : Dr Yevette Edwards "[name] is a 78 y.o. female with a h/o pulmonary hypertension who presents for EP consultation regarding possible tachy/brady syndrome.  She was apparently hospitalized 6/14 with cellulitis/ acute medical illness.  In this setting, she developed afib with RVR. She was placed on low dose coreg.  She returned in early July with symptomatic bradycardia in the setting of coreg.  Her coreg was held and she has done well since.  She denies any further symptomatic afib and also is unaware of symptomatic bradycardia off of coreg.  She was evaluated by Dr Purvis Sheffield and had an event monitor placed.  This documented sinus rhythm, without afib.  Ventricular rates were preserved.  She had mild nocturnal bradycardia only and no significant pauses were observed. She is doing reasonably well for her age at this time.  She does not feel limited from a CV standpoint presently. Today, she denies symptoms of chest pain, shortness of breath, orthopnea, PND, lower extremity edema, dizziness, presyncope, syncope, or neurologic sequela. The patient is tolerating medications without difficulties and is otherwise without complaint today.   Past Medical History  Diagnosis Date  . Depressive disorder, not elsewhere classified   . Anxiety state, unspecified   . Pure hypercholesterolemia   . Esophageal reflux   . Infection of esophagostomy   . Congestive heart failure, unspecified     diastolic heart failure  . Pulmonary hypertension     PA systolic pressure 55-65 mmHg by echocardiogram, PA pressure 33/10 by cardiac catheterization PA saturation 62% thermodilution cardiac index 2.0 thick cardiac index 2.4  . Bradycardia     previously with coreg  . Tricuspid regurgitation     moderate tricuspid regurgitation by echocardiogram, prior use of anorexic agents  . Melanoma 1995    removal on back     . Coronary artery disease     Moderate to severe coronary disease involving the left anterior descending artery and right coronary artery.  Sequential stenosis in the LAD is significant.  Right coronary artery is moderate to severely likely nonischemic.,  Questionable small LVOT obstruction.  No Brockenbrough maneuver was performed.  Catheterization January 2013  . PONV (postoperative nausea and vomiting)   . Hypertension   . Dysrhythmia   . H/O hiatal hernia   . History of percutaneous coronary intervention     Successful angioplasty and drug-eluting stent placed to an 80% lesion in the proximal LAD.,  Ostial lesion in the LAD was estimated to be 60% with an FFR ratio of 0.83, medical therapy was recommended for the latter lesion.  Dual antiplatelet therapy for one year.  Procedure performed May 08, 2011   Past Surgical History  Procedure Laterality Date  . Hiatal hernia repair  1993  . Rotator cuff repair  ~ 2001    right  . Coronary angioplasty with stent placement  05/08/11    "1"  . Tonsillectomy  ~ 1944  . Shoulder arthroscopy  ~ 2004; 2005    left; "joint's wore out"  . Cataract extraction w/ intraocular lens  implant, bilateral  ~ 2002  . Tubal ligation  1972    Current Outpatient Prescriptions  Medication Sig Dispense Refill  . albuterol (PROAIR HFA) 108 (90 BASE) MCG/ACT inhaler Inhale 2 puffs into the lungs every 6 (six) hours as needed.      Marland Kitchen alendronate (FOSAMAX) 70 MG tablet Take 70 mg  by mouth every 7 (seven) days. Take with a full glass of water on an empty stomach on Mondays      . ALPRAZolam (XANAX) 0.25 MG tablet 0.25 mg 3 (three) times daily as needed for anxiety. Take 1/2 to 1 by mouth three times daily      . amLODipine (NORVASC) 5 MG tablet Take 5 mg by mouth daily.      Marland Kitchen aspirin 81 MG chewable tablet Chew 81 mg by mouth every evening.      . citalopram (CELEXA) 40 MG tablet Take 0.5 tablets (20 mg total) by mouth daily.      Marland Kitchen esomeprazole (NEXIUM) 40 MG  capsule Take 40 mg by mouth daily before breakfast.      . ferrous sulfate 325 (65 FE) MG EC tablet Take 325 mg by mouth daily with breakfast.        . loratadine (CLARITIN) 10 MG tablet Take 10 mg by mouth daily.      Marland Kitchen losartan (COZAAR) 100 MG tablet Take 1 tablet (100 mg total) by mouth daily.  30 tablet  6  . Propylene Glycol (SYSTANE BALANCE OP) Place 1-2 drops into both eyes daily as needed (dry eyes).      . rosuvastatin (CRESTOR) 10 MG tablet Take 10 mg by mouth at bedtime.       . traZODone (DESYREL) 100 MG tablet Take 100 mg by mouth at bedtime.       . Vitamin D, Ergocalciferol, (DRISDOL) 50000 UNITS CAPS Take 50,000 Units by mouth every 7 (seven) days. Patient takes on Tuesdays      . zolpidem (AMBIEN) 10 MG tablet Take 10 mg by mouth at bedtime.         No current facility-administered medications for this visit.    Allergies  Allergen Reactions  . Sulfamethoxazole     REACTION: rash    History   Social History  . Marital Status: Divorced    Spouse Name: N/A    Number of Children: N/A  . Years of Education: N/A   Occupational History  . Not on file.   Social History Main Topics  . Smoking status: Former Smoker -- 1.00 packs/day for 20 years    Types: Cigarettes    Quit date: 04/26/1988  . Smokeless tobacco: Never Used  . Alcohol Use: No  . Drug Use: No  . Sexual Activity: No   Other Topics Concern  . Not on file   Social History Narrative  . No narrative on file    Family History  Problem Relation Age of Onset  . Heart attack Father     ROS- All systems are reviewed and negative except as per the HPI above  Physical Exam: Filed Vitals:   12/10/12 1108  BP: 150/85  Pulse: 85  Height: 5\' 2"  (1.575 m)  Weight: 152 lb (68.947 kg)  SpO2: 97%    GEN- The patient is well appearing, alert and oriented x 3 today.   Head- normocephalic, atraumatic Eyes-  Sclera clear, conjunctiva pink Ears- hearing intact Oropharynx- clear Neck- supple, no  JVP Lymph- no cervical lymphadenopathy Lungs- Clear to ausculation bilaterally, normal work of breathing Heart- Regular rate and rhythm, no murmurs, rubs or gallops, PMI not laterally displaced GI- soft, NT, ND, + BS Extremities- no clubbing, cyanosis, or edema  Event monitor, echo, Dr Caesar Chestnut notes, Dr Anola Gurney notes, and epic hospital records were reviewed extensively today  Assessment and Plan:  1. Tachy/brady She has had only  a single episode of afib in the setting of acute medical illness.   Her recent event monitor did not reveal afib.  She has had no symptomatic bradycardia off of coreg.  At this time, I do not feel that she has an indication for pacing.  She is clear that she would like to avoid PPM if possible.  If she has further tachypalpitations then using short acting cardizem as a prn medicine may be a reasonable option.  I have made to changes today.  I will see her as needed going forward.  2. afib Only documented in the setting of acute medical illness.  Given her pulmonary hypertension, she is certainly at risk for recurrence. If afib recurs then she will need anticoagulant therapy.  Cardizem as a pill in the pocket medicine may be a reasonable option.  She will continue follow with Dr Purvis Sheffield.  I will see as needed going forward.

## 2012-12-28 ENCOUNTER — Encounter: Payer: Self-pay | Admitting: Cardiovascular Disease

## 2012-12-28 ENCOUNTER — Ambulatory Visit (INDEPENDENT_AMBULATORY_CARE_PROVIDER_SITE_OTHER): Payer: Medicare Other | Admitting: Cardiovascular Disease

## 2012-12-28 VITALS — BP 112/62 | HR 67 | Ht 62.0 in | Wt 155.0 lb

## 2012-12-28 DIAGNOSIS — Z9889 Other specified postprocedural states: Secondary | ICD-10-CM

## 2012-12-28 DIAGNOSIS — I498 Other specified cardiac arrhythmias: Secondary | ICD-10-CM

## 2012-12-28 DIAGNOSIS — I10 Essential (primary) hypertension: Secondary | ICD-10-CM

## 2012-12-28 DIAGNOSIS — I2510 Atherosclerotic heart disease of native coronary artery without angina pectoris: Secondary | ICD-10-CM

## 2012-12-28 DIAGNOSIS — R001 Bradycardia, unspecified: Secondary | ICD-10-CM

## 2012-12-28 DIAGNOSIS — R609 Edema, unspecified: Secondary | ICD-10-CM

## 2012-12-28 DIAGNOSIS — Z9861 Coronary angioplasty status: Secondary | ICD-10-CM

## 2012-12-28 DIAGNOSIS — I4891 Unspecified atrial fibrillation: Secondary | ICD-10-CM

## 2012-12-28 NOTE — Progress Notes (Signed)
Patient ID: [name], female   DOB: 09/19/1934, 78 y.o.   MRN: [id number]   SUBJECTIVE: [name] is doing well, denying chest pain, shortness of breath, palpitations, leg swelling, lightheadedness, and dizziness. Her event monitor did not reveal any evidence of atrial fibrillation.    Allergies  Allergen Reactions  . Sulfamethoxazole     REACTION: rash    Current Outpatient Prescriptions  Medication Sig Dispense Refill  . albuterol (PROAIR HFA) 108 (90 BASE) MCG/ACT inhaler Inhale 2 puffs into the lungs every 6 (six) hours as needed.      Marland Kitchen alendronate (FOSAMAX) 70 MG tablet Take 70 mg by mouth every 7 (seven) days. Take with a full glass of water on an empty stomach on Mondays      . ALPRAZolam (XANAX) 0.25 MG tablet 0.25 mg 3 (three) times daily as needed for anxiety. Take 1/2 to 1 by mouth three times daily      . amLODipine (NORVASC) 5 MG tablet Take 5 mg by mouth daily.      Marland Kitchen aspirin 81 MG chewable tablet Chew 81 mg by mouth every evening.      . citalopram (CELEXA) 40 MG tablet Take 0.5 tablets (20 mg total) by mouth daily.      Marland Kitchen esomeprazole (NEXIUM) 40 MG capsule Take 40 mg by mouth daily before breakfast.      . ferrous sulfate 325 (65 FE) MG EC tablet Take 325 mg by mouth daily with breakfast.        . loratadine (CLARITIN) 10 MG tablet Take 10 mg by mouth daily.      Marland Kitchen losartan (COZAAR) 100 MG tablet Take 1 tablet (100 mg total) by mouth daily.  30 tablet  6  . Propylene Glycol (SYSTANE BALANCE OP) Place 1-2 drops into both eyes daily as needed (dry eyes).      . rosuvastatin (CRESTOR) 10 MG tablet Take 10 mg by mouth at bedtime.       . traZODone (DESYREL) 100 MG tablet Take 100 mg by mouth at bedtime.       . Vitamin D, Ergocalciferol, (DRISDOL) 50000 UNITS CAPS Take 50,000 Units by mouth every 7 (seven) days. Patient takes on Tuesdays      . zolpidem (AMBIEN) 10 MG tablet Take 10 mg by mouth at bedtime.         No current facility-administered medications for  this visit.    Past Medical History  Diagnosis Date  . Depressive disorder, not elsewhere classified   . Anxiety state, unspecified   . Pure hypercholesterolemia   . Esophageal reflux   . Infection of esophagostomy   . Congestive heart failure, unspecified     diastolic heart failure  . Pulmonary hypertension     PA systolic pressure 55-65 mmHg by echocardiogram, PA pressure 33/10 by cardiac catheterization PA saturation 62% thermodilution cardiac index 2.0 thick cardiac index 2.4  . Bradycardia     previously with coreg  . Tricuspid regurgitation     moderate tricuspid regurgitation by echocardiogram, prior use of anorexic agents  . Melanoma 1995    removal on back   . Coronary artery disease     Moderate to severe coronary disease involving the left anterior descending artery and right coronary artery.  Sequential stenosis in the LAD is significant.  Right coronary artery is moderate to severely likely nonischemic.,  Questionable small LVOT obstruction.  No Brockenbrough maneuver was performed.  Catheterization January 2013  . PONV (postoperative  nausea and vomiting)   . Hypertension   . Dysrhythmia   . H/O hiatal hernia   . History of percutaneous coronary intervention     Successful angioplasty and drug-eluting stent placed to an 80% lesion in the proximal LAD.,  Ostial lesion in the LAD was estimated to be 60% with an FFR ratio of 0.83, medical therapy was recommended for the latter lesion.  Dual antiplatelet therapy for one year.  Procedure performed May 08, 2011    Past Surgical History  Procedure Laterality Date  . Hiatal hernia repair  1993  . Rotator cuff repair  ~ 2001    right  . Coronary angioplasty with stent placement  05/08/11    "1"  . Tonsillectomy  ~ 1944  . Shoulder arthroscopy  ~ 2004; 2005    left; "joint's wore out"  . Cataract extraction w/ intraocular lens  implant, bilateral  ~ 2002  . Tubal ligation  1972    History   Social History  .  Marital Status: Divorced    Spouse Name: N/A    Number of Children: N/A  . Years of Education: N/A   Occupational History  . Not on file.   Social History Main Topics  . Smoking status: Former Smoker -- 1.00 packs/day for 20 years    Types: Cigarettes    Quit date: 04/26/1988  . Smokeless tobacco: Never Used  . Alcohol Use: No  . Drug Use: No  . Sexual Activity: No   Other Topics Concern  . Not on file   Social History Narrative  . No narrative on file     Filed Vitals:   12/28/12 0817  BP: 112/62  Pulse: 67  Height: 5\' 2"  (1.575 m)  Weight: 155 lb (70.308 kg)    PHYSICAL EXAM General: NAD Neck: No JVD, no thyromegaly or thyroid nodule.  Lungs: Clear to auscultation bilaterally with normal respiratory effort. CV: Nondisplaced PMI.  Heart mostly regular wit occasional premature contractions noted, normal S1/S2, no S3/S4, no murmur.  No peripheral edema.  No carotid bruit.  Normal pedal pulses.  Abdomen: Soft, nontender, no hepatosplenomegaly, no distention.  Neurologic: Alert and oriented x 3.  Psych: Normal affect. Extremities: No clubbing or cyanosis.   ECG: reviewed and available in electronic records.      ASSESSMENT AND PLAN: 1. CAD s/p LAD stent: continue ASA. No beta blockers due to symptomatic bradycardia.  2. HTN: controlled after restarting Losartan 100 mg daily at last visit. 3. Symptomatic bradycardia/Paroxysmal atrial fibrillation: event monitor did not reveal any tachy-brady arrhythmias. 4. Pulm HTN: now only mild TR, stable.  5. Chronic diastolic dysfunction: she takes prn Lasix for leg swelling.  6. Paroxysmal atrial fibrillation: if she indeed has recurrences and we can document this, will need to consider anticoagulation, and I discussed this with the patient.       Prentice Docker, M.D., F.A.C.C.

## 2012-12-28 NOTE — Patient Instructions (Signed)
Continue all current medications. Your physician wants you to follow up in: 6 months.  You will receive a reminder letter in the mail one-two months in advance.  If you don't receive a letter, please call our office to schedule the follow up appointment   

## 2013-03-09 ENCOUNTER — Encounter: Payer: Self-pay | Admitting: Cardiovascular Disease

## 2013-06-17 ENCOUNTER — Ambulatory Visit: Payer: Medicare Other | Admitting: Cardiovascular Disease

## 2013-06-29 ENCOUNTER — Ambulatory Visit (INDEPENDENT_AMBULATORY_CARE_PROVIDER_SITE_OTHER): Payer: Medicare Other | Admitting: Cardiovascular Disease

## 2013-06-29 ENCOUNTER — Encounter: Payer: Self-pay | Admitting: Cardiovascular Disease

## 2013-06-29 VITALS — BP 157/87 | HR 61 | Ht 62.0 in | Wt 168.0 lb

## 2013-06-29 DIAGNOSIS — Z9889 Other specified postprocedural states: Secondary | ICD-10-CM

## 2013-06-29 DIAGNOSIS — I071 Rheumatic tricuspid insufficiency: Secondary | ICD-10-CM

## 2013-06-29 DIAGNOSIS — I4891 Unspecified atrial fibrillation: Secondary | ICD-10-CM

## 2013-06-29 DIAGNOSIS — I2510 Atherosclerotic heart disease of native coronary artery without angina pectoris: Secondary | ICD-10-CM

## 2013-06-29 DIAGNOSIS — Z9861 Coronary angioplasty status: Secondary | ICD-10-CM

## 2013-06-29 DIAGNOSIS — I2789 Other specified pulmonary heart diseases: Secondary | ICD-10-CM

## 2013-06-29 DIAGNOSIS — I498 Other specified cardiac arrhythmias: Secondary | ICD-10-CM

## 2013-06-29 DIAGNOSIS — I272 Pulmonary hypertension, unspecified: Secondary | ICD-10-CM

## 2013-06-29 DIAGNOSIS — R001 Bradycardia, unspecified: Secondary | ICD-10-CM

## 2013-06-29 DIAGNOSIS — I10 Essential (primary) hypertension: Secondary | ICD-10-CM

## 2013-06-29 DIAGNOSIS — I079 Rheumatic tricuspid valve disease, unspecified: Secondary | ICD-10-CM

## 2013-06-29 NOTE — Patient Instructions (Signed)
Continue all current medications. Your physician wants you to follow up in: 6 months.  You will receive a reminder letter in the mail one-two months in advance.  If you don't receive a letter, please call our office to schedule the follow up appointment   

## 2013-06-29 NOTE — Progress Notes (Signed)
Patient ID: MERLE "Shavanna" I Tignor, female   DOB: 04-15-1934, 79 y.o.   MRN: [id number]      SUBJECTIVE: Mrs. Canning has been doing well from a cardiovascular perspective. She denies chest pain, palpitations, lightheadedness, dizziness, leg swelling and syncope. She has not had to use any Lasix for leg swelling for several months. Unfortunately, she has had a bad [place] of bronchitis for over 2 weeks and has been using prednisone and is on her second course of antibiotics.    Allergies  Allergen Reactions  . Sulfamethoxazole     REACTION: rash    Current Outpatient Prescriptions  Medication Sig Dispense Refill  . albuterol (PROAIR HFA) 108 (90 BASE) MCG/ACT inhaler Inhale 2 puffs into the lungs every 6 (six) hours as needed.      Marland Kitchen alendronate (FOSAMAX) 70 MG tablet Take 70 mg by mouth every 7 (seven) days. Take with a full glass of water on an empty stomach on Mondays      . ALPRAZolam (XANAX) 0.25 MG tablet 0.25 mg 3 (three) times daily as needed for anxiety. Take 1/2 to 1 by mouth three times daily      . aspirin 81 MG chewable tablet Chew 81 mg by mouth every evening.      . citalopram (CELEXA) 40 MG tablet Take 0.5 tablets (20 mg total) by mouth daily.      Marland Kitchen esomeprazole (NEXIUM) 40 MG capsule Take 40 mg by mouth daily before breakfast.      . ferrous sulfate 325 (65 FE) MG EC tablet Take 325 mg by mouth daily with breakfast.        . levofloxacin (LEVAQUIN) 500 MG tablet Take 500 mg by mouth daily.      Marland Kitchen loratadine (CLARITIN) 10 MG tablet Take 10 mg by mouth daily.      Marland Kitchen losartan (COZAAR) 100 MG tablet Take 1 tablet (100 mg total) by mouth daily.  30 tablet  6  . Propylene Glycol (SYSTANE BALANCE OP) Place 1-2 drops into both eyes daily as needed (dry eyes).      . rosuvastatin (CRESTOR) 10 MG tablet Take 10 mg by mouth at bedtime.       . traZODone (DESYREL) 100 MG tablet Take 100 mg by mouth at bedtime.       . Vitamin D, Ergocalciferol, (DRISDOL) 50000 UNITS CAPS Take 50,000 Units by  mouth every 7 (seven) days. Patient takes on Tuesdays      . zolpidem (AMBIEN) 10 MG tablet Take 10 mg by mouth at bedtime.         No current facility-administered medications for this visit.    Past Medical History  Diagnosis Date  . Depressive disorder, not elsewhere classified   . Anxiety state, unspecified   . Pure hypercholesterolemia   . Esophageal reflux   . Infection of esophagostomy   . Congestive heart failure, unspecified     diastolic heart failure  . Pulmonary hypertension     PA systolic pressure 37-16 mmHg by echocardiogram, PA pressure 33/10 by cardiac catheterization PA saturation 62% thermodilution cardiac index 2.0 thick cardiac index 2.4  . Bradycardia     previously with coreg  . Tricuspid regurgitation     moderate tricuspid regurgitation by echocardiogram, prior use of anorexic agents  . Melanoma 1995    removal on back   . Coronary artery disease     Moderate to severe coronary disease involving the left anterior descending artery and right coronary artery.  Sequential stenosis in the LAD is significant.  Right coronary artery is moderate to severely likely nonischemic.,  Questionable small LVOT obstruction.  No Brockenbrough maneuver was performed.  Catheterization January 2013  . PONV (postoperative nausea and vomiting)   . Hypertension   . Dysrhythmia   . H/O hiatal hernia   . History of percutaneous coronary intervention     Successful angioplasty and drug-eluting stent placed to an 80% lesion in the proximal LAD.,  Ostial lesion in the LAD was estimated to be 60% with an FFR ratio of 0.83, medical therapy was recommended for the latter lesion.  Dual antiplatelet therapy for one year.  Procedure performed May 08, 2011    Past Surgical History  Procedure Laterality Date  . Hiatal hernia repair  1993  . Rotator cuff repair  ~ 2001    right  . Coronary angioplasty with stent placement  05/08/11    "1"  . Tonsillectomy  ~ 1944  . Shoulder  arthroscopy  ~ 2004; 2005    left; "joint's wore out"  . Cataract extraction w/ intraocular lens  implant, bilateral  ~ 2002  . Tubal ligation  1972    History   Social History  . Marital Status: Divorced    Spouse Name: N/A    Number of Children: N/A  . Years of Education: N/A   Occupational History  . Not on file.   Social History Main Topics  . Smoking status: Former Smoker -- 1.00 packs/day for 20 years    Types: Cigarettes    Quit date: 04/26/1988  . Smokeless tobacco: Never Used  . Alcohol Use: No  . Drug Use: No  . Sexual Activity: No   Other Topics Concern  . Not on file   Social History Narrative  . No narrative on file     Filed Vitals:   06/29/13 1502  BP: 157/87  Pulse: 61  Height: 5\' 2"  (1.575 m)  Weight: 168 lb (76.204 kg)  SpO2: 98%    PHYSICAL EXAM General: NAD Neck: No JVD, no thyromegaly. Lungs: Clear to auscultation bilaterally with normal respiratory effort. CV: Nondisplaced PMI.  Regular rate and rhythm, normal S1/S2, no S3/S4, no murmur. No pretibial or periankle edema.  No carotid bruit.  Normal pedal pulses.  Abdomen: Soft, nontender, no hepatosplenomegaly, no distention.  Neurologic: Alert and oriented x 3.  Psych: Normal affect. Extremities: No clubbing or cyanosis.   ECG: reviewed and available in electronic records.      ASSESSMENT AND PLAN: 1. CAD s/p LAD stent: Symptomatically stable. Continue ASA and statin. No beta blockers due to symptomatic bradycardia.  2. HTN: presently uncontrolled but says it has been normal, and thinks it may be elevated due to her ongoing bronchitis and concurrent medications. She takes losartan 100 mg daily. I will not make any adjustments today. 3. Symptomatic bradycardia/Paroxysmal atrial fibrillation: event monitor in 2014 did not reveal any tachy-brady arrhythmias.  4. Pulm HTN: mild TR, stable.  5. Chronic diastolic dysfunction: symptomatically stable, with no use of Lasix for several  months. 6. Paroxysmal atrial fibrillation: if she indeed has recurrences and we can document this, will need to consider anticoagulation.  Dispo:  F/u 6 months.  Kate Sable, M.D., F.A.C.C.

## 2013-12-08 ENCOUNTER — Encounter: Payer: Self-pay | Admitting: Cardiovascular Disease

## 2013-12-08 ENCOUNTER — Ambulatory Visit (INDEPENDENT_AMBULATORY_CARE_PROVIDER_SITE_OTHER): Payer: Medicare Other | Admitting: Cardiovascular Disease

## 2013-12-08 VITALS — BP 180/75 | HR 61 | Ht 62.0 in | Wt 164.0 lb

## 2013-12-08 DIAGNOSIS — I10 Essential (primary) hypertension: Secondary | ICD-10-CM

## 2013-12-08 DIAGNOSIS — R609 Edema, unspecified: Secondary | ICD-10-CM

## 2013-12-08 DIAGNOSIS — I071 Rheumatic tricuspid insufficiency: Secondary | ICD-10-CM

## 2013-12-08 DIAGNOSIS — R001 Bradycardia, unspecified: Secondary | ICD-10-CM

## 2013-12-08 DIAGNOSIS — I2789 Other specified pulmonary heart diseases: Secondary | ICD-10-CM

## 2013-12-08 DIAGNOSIS — I480 Paroxysmal atrial fibrillation: Secondary | ICD-10-CM

## 2013-12-08 DIAGNOSIS — I079 Rheumatic tricuspid valve disease, unspecified: Secondary | ICD-10-CM

## 2013-12-08 DIAGNOSIS — Z9889 Other specified postprocedural states: Secondary | ICD-10-CM

## 2013-12-08 DIAGNOSIS — I498 Other specified cardiac arrhythmias: Secondary | ICD-10-CM

## 2013-12-08 DIAGNOSIS — I272 Pulmonary hypertension, unspecified: Secondary | ICD-10-CM

## 2013-12-08 DIAGNOSIS — I4891 Unspecified atrial fibrillation: Secondary | ICD-10-CM

## 2013-12-08 DIAGNOSIS — I2510 Atherosclerotic heart disease of native coronary artery without angina pectoris: Secondary | ICD-10-CM

## 2013-12-08 DIAGNOSIS — Z9861 Coronary angioplasty status: Secondary | ICD-10-CM

## 2013-12-08 NOTE — Patient Instructions (Signed)
There were no changes to your medications. Continue as directed. Your physician wants you to follow up in: 6 months.  You will receive a reminder letter in the mail one-two months in advance.  If you don't receive a letter, please call our office to schedule the follow up appointment.

## 2013-12-08 NOTE — Progress Notes (Signed)
Patient ID: [name], female   DOB: July 16, 1934, 79 y.o.   MRN: [id number]      SUBJECTIVE: The patient continues to do well from a cardiovascular perspective, denying chest pain, shortness of breath, palpitations and leg swelling. She has not had to use any Lasix. She stays very active participating in multiple classes at the [facility] including yoga, and also does line dancing and plays pickle ball. Her primary complaints are related to abdominal discomfort and she is scheduled to see a gastroenterologist. She had some cramping this morning and feels that this is the reason her blood pressure is elevated. She had it checked at her primary provider's office yesterday and it was 120/82. ECG performed in the office today demonstrates normal sinus rhythm, heart rate 61 beats per minute.  Review of Systems: As per "subjective", otherwise negative.  Allergies  Allergen Reactions  . Sulfamethoxazole     REACTION: rash    Current Outpatient Prescriptions  Medication Sig Dispense Refill  . albuterol (PROAIR HFA) 108 (90 BASE) MCG/ACT inhaler Inhale 2 puffs into the lungs every 6 (six) hours as needed.      Marland Kitchen alendronate (FOSAMAX) 70 MG tablet Take 70 mg by mouth every 7 (seven) days. Take with a full glass of water on an empty stomach on Mondays      . ALPRAZolam (XANAX) 0.25 MG tablet 0.25 mg 3 (three) times daily as needed for anxiety. Take 1/2 to 1 by mouth three times daily      . aspirin 81 MG chewable tablet Chew 81 mg by mouth every evening.      . citalopram (CELEXA) 40 MG tablet Take 0.5 tablets (20 mg total) by mouth daily.      Marland Kitchen esomeprazole (NEXIUM) 40 MG capsule Take 40 mg by mouth daily before breakfast.      . ferrous sulfate 325 (65 FE) MG EC tablet Take 325 mg by mouth daily with breakfast.        . loratadine (CLARITIN) 10 MG tablet Take 10 mg by mouth daily.      Marland Kitchen losartan (COZAAR) 100 MG tablet Take 1 tablet (100 mg total) by mouth daily.  30 tablet  6  . Propylene Glycol  (SYSTANE BALANCE OP) Place 1-2 drops into both eyes daily as needed (dry eyes).      . rosuvastatin (CRESTOR) 10 MG tablet Take 10 mg by mouth at bedtime.       . traZODone (DESYREL) 100 MG tablet Take 100 mg by mouth at bedtime.       . Vitamin D, Ergocalciferol, (DRISDOL) 50000 UNITS CAPS Take 50,000 Units by mouth every 7 (seven) days. Patient takes on Tuesdays      . zolpidem (AMBIEN) 10 MG tablet Take 10 mg by mouth at bedtime.         No current facility-administered medications for this visit.    Past Medical History  Diagnosis Date  . Depressive disorder, not elsewhere classified   . Anxiety state, unspecified   . Pure hypercholesterolemia   . Esophageal reflux   . Infection of esophagostomy   . Congestive heart failure, unspecified     diastolic heart failure  . Pulmonary hypertension     PA systolic pressure 81-82 mmHg by echocardiogram, PA pressure 33/10 by cardiac catheterization PA saturation 62% thermodilution cardiac index 2.0 thick cardiac index 2.4  . Bradycardia     previously with coreg  . Tricuspid regurgitation     moderate tricuspid regurgitation  by echocardiogram, prior use of anorexic agents  . Melanoma 1995    removal on back   . Coronary artery disease     Moderate to severe coronary disease involving the left anterior descending artery and right coronary artery.  Sequential stenosis in the LAD is significant.  Right coronary artery is moderate to severely likely nonischemic.,  Questionable small LVOT obstruction.  No Brockenbrough maneuver was performed.  Catheterization January 2013  . PONV (postoperative nausea and vomiting)   . Hypertension   . Dysrhythmia   . H/O hiatal hernia   . History of percutaneous coronary intervention     Successful angioplasty and drug-eluting stent placed to an 80% lesion in the proximal LAD.,  Ostial lesion in the LAD was estimated to be 60% with an FFR ratio of 0.83, medical therapy was recommended for the latter lesion.   Dual antiplatelet therapy for one year.  Procedure performed May 08, 2011    Past Surgical History  Procedure Laterality Date  . Hiatal hernia repair  1993  . Rotator cuff repair  ~ 2001    right  . Coronary angioplasty with stent placement  05/08/11    "1"  . Tonsillectomy  ~ 1944  . Shoulder arthroscopy  ~ 2004; 2005    left; "joint's wore out"  . Cataract extraction w/ intraocular lens  implant, bilateral  ~ 2002  . Tubal ligation  1972    History   Social History  . Marital Status: Divorced    Spouse Name: N/A    Number of Children: N/A  . Years of Education: N/A   Occupational History  . Not on file.   Social History Main Topics  . Smoking status: Former Smoker -- 1.00 packs/day for 20 years    Types: Cigarettes    Quit date: 04/26/1988  . Smokeless tobacco: Never Used  . Alcohol Use: No  . Drug Use: No  . Sexual Activity: No   Other Topics Concern  . Not on file   Social History Narrative  . No narrative on file     Filed Vitals:   12/08/13 0844  BP: 180/75  Pulse: 61  Height: 5\' 2"  (1.575 m)  Weight: 164 lb (74.39 kg)    PHYSICAL EXAM General: NAD HEENT: Normal. Neck: No JVD, no thyromegaly. Lungs: Clear to auscultation bilaterally with normal respiratory effort. CV: Nondisplaced PMI.  Regular rate and rhythm, normal S1/S2, no S3/S4, no murmur. No pretibial or periankle edema.  No carotid bruit.  Normal pedal pulses.  Abdomen: Soft, nontender, no hepatosplenomegaly, no distention.  Neurologic: Alert and oriented x 3.  Psych: Normal affect. Skin: Normal. Musculoskeletal: Normal range of motion, no gross deformities. Extremities: No clubbing or cyanosis.   ECG: Most recent ECG reviewed.      ASSESSMENT AND PLAN: 1. CAD s/p LAD stent: Stable ischemic heart disease. Continue ASA and statin. No beta blockers due to symptomatic bradycardia.  2. Essential HTN: Remains uncontrolled but pt attributes this to abdominal discomfort earlier this  morning, as it was normal yesterday. On losartan 100 mg daily. I've asked her to monitor this at home. If it remains elevated, I will add amlodipine 5 mg daily.  3. Symptomatic bradycardia/Paroxysmal atrial fibrillation: Event monitor in 2014 did not reveal any tachy-brady arrhythmias.  4. Pulm HTN: Stable.  5. Chronic diastolic dysfunction: Symptomatically stable, with no recent use of Lasix. 6. Paroxysmal atrial fibrillation: If she indeed has recurrences and we can document this, will need to consider  anticoagulation.   Dispo: F/u 6 months.   Kate Sable, M.D., F.A.C.C.

## 2013-12-09 ENCOUNTER — Encounter: Payer: Self-pay | Admitting: Internal Medicine

## 2014-02-09 ENCOUNTER — Encounter: Payer: Self-pay | Admitting: *Deleted

## 2014-02-11 ENCOUNTER — Encounter: Payer: Self-pay | Admitting: *Deleted

## 2014-02-17 ENCOUNTER — Other Ambulatory Visit (INDEPENDENT_AMBULATORY_CARE_PROVIDER_SITE_OTHER): Payer: PRIVATE HEALTH INSURANCE

## 2014-02-17 ENCOUNTER — Other Ambulatory Visit: Payer: Medicare Other

## 2014-02-17 ENCOUNTER — Ambulatory Visit (INDEPENDENT_AMBULATORY_CARE_PROVIDER_SITE_OTHER): Payer: PRIVATE HEALTH INSURANCE | Admitting: Internal Medicine

## 2014-02-17 ENCOUNTER — Encounter: Payer: Self-pay | Admitting: Internal Medicine

## 2014-02-17 VITALS — BP 158/70 | HR 68 | Ht 62.0 in | Wt 169.4 lb

## 2014-02-17 DIAGNOSIS — Z9889 Other specified postprocedural states: Secondary | ICD-10-CM

## 2014-02-17 DIAGNOSIS — K5909 Other constipation: Secondary | ICD-10-CM

## 2014-02-17 DIAGNOSIS — K5900 Constipation, unspecified: Secondary | ICD-10-CM

## 2014-02-17 DIAGNOSIS — R140 Abdominal distension (gaseous): Secondary | ICD-10-CM

## 2014-02-17 DIAGNOSIS — R1013 Epigastric pain: Secondary | ICD-10-CM

## 2014-02-17 DIAGNOSIS — K219 Gastro-esophageal reflux disease without esophagitis: Secondary | ICD-10-CM

## 2014-02-17 DIAGNOSIS — K2270 Barrett's esophagus without dysplasia: Secondary | ICD-10-CM

## 2014-02-17 LAB — COMPREHENSIVE METABOLIC PANEL
ALBUMIN: 4 g/dL (ref 3.5–5.2)
ALT: 13 U/L (ref 0–35)
AST: 14 U/L (ref 0–37)
Alkaline Phosphatase: 62 U/L (ref 39–117)
BUN: 19 mg/dL (ref 6–23)
CALCIUM: 9.5 mg/dL (ref 8.4–10.5)
CHLORIDE: 104 meq/L (ref 96–112)
CO2: 26 mEq/L (ref 19–32)
Creatinine, Ser: 1.2 mg/dL (ref 0.4–1.2)
GFR: 48.59 mL/min — ABNORMAL LOW (ref >60.00)
Glucose, Bld: 91 mg/dL (ref 70–99)
POTASSIUM: 4.1 meq/L (ref 3.5–5.1)
SODIUM: 138 meq/L (ref 135–145)
Total Bilirubin: 0.6 mg/dL (ref 0.2–1.2)
Total Protein: 6.2 g/dL (ref 6.0–8.3)

## 2014-02-17 LAB — CBC WITH DIFFERENTIAL/PLATELET
BASOS ABS: 0 10*3/uL (ref 0.0–0.1)
Basophils Relative: 0.4 % (ref 0.0–3.0)
EOS ABS: 0.1 10*3/uL (ref 0.0–0.7)
Eosinophils Relative: 2.8 % (ref 0.0–5.0)
HCT: 36.1 % (ref 36.0–46.0)
Hemoglobin: 11.7 g/dL — ABNORMAL LOW (ref 12.0–15.0)
LYMPHS PCT: 25.1 % (ref 12.0–46.0)
Lymphs Abs: 1.3 10*3/uL (ref 0.7–4.0)
MCHC: 32.4 g/dL (ref 30.0–36.0)
MCV: 83 fl (ref 78.0–100.0)
Monocytes Absolute: 0.3 10*3/uL (ref 0.1–1.0)
Monocytes Relative: 6.8 % (ref 3.0–12.0)
NEUTROS PCT: 64.9 % (ref 43.0–77.0)
Neutro Abs: 3.3 10*3/uL (ref 1.4–7.7)
Platelets: 180 10*3/uL (ref 150.0–400.0)
RBC: 4.35 Mil/uL (ref 3.87–5.11)
RDW: 14.8 % (ref 11.5–15.5)
WBC: 5.1 10*3/uL (ref 4.0–10.5)

## 2014-02-17 LAB — LIPASE: Lipase: 20 U/L (ref 11.0–59.0)

## 2014-02-17 MED ORDER — METRONIDAZOLE 250 MG PO TABS: 250.0000 mg | ORAL_TABLET | Freq: Three times a day (TID) | ORAL | Status: DC

## 2014-02-17 MED ORDER — LINACLOTIDE 290 MCG PO CAPS: 290.0000 ug | ORAL_CAPSULE | Freq: Every day | ORAL | Status: DC

## 2014-02-17 NOTE — Patient Instructions (Signed)
You have been scheduled for an endoscopy. Please follow written instructions given to you at your visit today. If you use inhalers (even only as needed), please bring them with you on the day of your procedure. Your physician has requested that you go to www.startemmi.com and enter the access code given to you at your visit today. This web site gives a general overview about your procedure. However, you should still follow specific instructions given to you by our office regarding your preparation for the procedure.  We have sent the following medications to your pharmacy for you to pick up at your convenience: Linzess 290 mg -Take 1 capsule by mouth once daily Flagyl 250- Take 1 tablet 3 times daily x 7 days  Your physician has requested that you go to the basement for the following lab work before leaving today: CMP, CBC, Lipase  You have been scheduled for an abdominal ultrasound at [place] (1st floor of hospital) on 02/21/14 at 8:00 am. Please arrive 15 minutes prior to your appointment for registration. Make certain not to have anything to eat or drink 6 hours prior to your appointment. Should you need to reschedule your appointment, please contact radiology at [phone]. This test typically takes about 30 minutes to perform.  CC:Dr Particia Nearing

## 2014-02-17 NOTE — Progress Notes (Signed)
Patient ID: [name], female   DOB: [birth year], 79 y.o.   MRN: [id number] HPI: [name] is a 79 year old female with a past medical history of GERD status post Nissen fundoplication, chronic nausea, [name]'s esophagus, IBS, chronic constipation, CAD, hypertension, chronic diastolic CHF, macular degeneration who is seen in consultation at the request of Particia Nearing, PA-C to evaluate epigastric pain and bloating. She is here today with her sister. She reports a history of chronic almost lifelong nausea. In the past she has seen Dr. Cristina Gong and also Dr. Sharlett Iles. She takes Nexium 40 mg daily for her history of [name]'s esophagus though she denies heartburn or dysphagia. Over the last 2-3 months she's had epigastric pain near the xiphoid radiating to her mid abdomen around her umbilicus. This pain feels like intense pressure and swelling which is worse after eating. It is affecting her activity such as dancing and walking. She reports chronic constipation with incomplete evacuation and hard stools. She is tried multiple laxatives in the past including lubiprostone, MiraLAX, Dulcolax, senna. In the past she is tried gluten-free diet and been tested for celiac disease but this has been negative or unhelpful. She does feel chronically bloated but denies belching or lower intestinal gas. No weight loss. No fevers or chills. No blood in her stool or melena.  Last upper endoscopy performed 08/06/2006, Dr. Verl Blalock. Prior antireflux surgery noted, [name]'s esophagus without dysplasia. Nodular antral mucosa. Normal duodenum. Colonoscopy performed 08/06/2006, Dr. Verl Blalock. Very long redundant and tortuous colon but normal otherwise. No polyps noted.  Past Medical History  Diagnosis Date  . Depressive disorder, not elsewhere classified   . Anxiety state, unspecified   . Pure hypercholesterolemia   . Esophageal reflux   . Infection of esophagostomy   . Congestive heart failure,  unspecified     diastolic heart failure  . Pulmonary hypertension     PA systolic pressure 54-62 mmHg by echocardiogram, PA pressure 33/10 by cardiac catheterization PA saturation 62% thermodilution cardiac index 2.0 thick cardiac index 2.4  . Bradycardia     previously with coreg  . Tricuspid regurgitation     moderate tricuspid regurgitation by echocardiogram, prior use of anorexic agents  . Melanoma 1995    removal on back   . Coronary artery disease     Moderate to severe coronary disease involving the left anterior descending artery and right coronary artery.  Sequential stenosis in the LAD is significant.  Right coronary artery is moderate to severely likely nonischemic.,  Questionable small LVOT obstruction.  No Brockenbrough maneuver was performed.  Catheterization January 2013  . PONV (postoperative nausea and vomiting)   . Hypertension   . Dysrhythmia   . H/O hiatal hernia   . History of percutaneous coronary intervention     Successful angioplasty and drug-eluting stent placed to an 80% lesion in the proximal LAD.,  Ostial lesion in the LAD was estimated to be 60% with an FFR ratio of 0.83, medical therapy was recommended for the latter lesion.  Dual antiplatelet therapy for one year.  Procedure performed May 08, 2011  . Gastritis   . [name]'s esophagus   . IBS (irritable bowel syndrome)   . Macular degeneration     Past Surgical History  Procedure Laterality Date  . Nissen fundoplication  7035  . Rotator cuff repair  ~ 2001    right  . Coronary angioplasty with stent placement  05/08/11    "1"  . Tonsillectomy  ~ 1944  .  Shoulder arthroscopy  ~ 2004; 2005    left; "joint's wore out"  . Cataract extraction w/ intraocular lens  implant, bilateral  ~ 2002  . Tubal ligation  1972    Outpatient Prescriptions Prior to Visit  Medication Sig Dispense Refill  . albuterol (PROAIR HFA) 108 (90 BASE) MCG/ACT inhaler Inhale 2 puffs into the lungs every 6 (six) hours as  needed.    Marland Kitchen alendronate (FOSAMAX) 70 MG tablet Take 70 mg by mouth every 7 (seven) days. Take with a full glass of water on an empty stomach on Mondays    . ALPRAZolam (XANAX) 0.25 MG tablet 0.25 mg 3 (three) times daily as needed for anxiety. Take 1/2 to 1 by mouth three times daily    . aspirin 81 MG chewable tablet Chew 81 mg by mouth every evening.    . citalopram (CELEXA) 40 MG tablet Take 0.5 tablets (20 mg total) by mouth daily.    Marland Kitchen esomeprazole (NEXIUM) 40 MG capsule Take 40 mg by mouth daily before breakfast.    . ferrous sulfate 325 (65 FE) MG EC tablet Take 325 mg by mouth daily with breakfast.      . loratadine (CLARITIN) 10 MG tablet Take 10 mg by mouth daily.    Marland Kitchen losartan (COZAAR) 100 MG tablet Take 1 tablet (100 mg total) by mouth daily. 30 tablet 6  . Propylene Glycol (SYSTANE BALANCE OP) Place 1-2 drops into both eyes daily as needed (dry eyes).    . rosuvastatin (CRESTOR) 10 MG tablet Take 10 mg by mouth at bedtime.     . traZODone (DESYREL) 100 MG tablet Take 100 mg by mouth at bedtime.     . Vitamin D, Ergocalciferol, (DRISDOL) 50000 UNITS CAPS Take 50,000 Units by mouth every 7 (seven) days. Patient takes on Tuesdays    . zolpidem (AMBIEN) 10 MG tablet Take 10 mg by mouth at bedtime.       No facility-administered medications prior to visit.    Allergies  Allergen Reactions  . Sulfamethoxazole     REACTION: rash    Family History  Problem Relation Age of Onset  . Heart attack Father   . Diabetes Mother   . Hypertension Mother   . Hypertension Father     History  Substance Use Topics  . Smoking status: Former Smoker -- 1.00 packs/day for 20 years    Types: Cigarettes    Quit date: 04/26/1988  . Smokeless tobacco: Never Used  . Alcohol Use: No    ROS: As per history of present illness, otherwise negative  BP 158/70 mmHg  Pulse 68  Ht 5\' 2"  (1.575 m)  Wt 169 lb 6.4 oz (76.839 kg)  BMI 30.98 kg/m2 Constitutional: Well-developed and well-nourished.  No distress. HEENT: Normocephalic and atraumatic. Oropharynx is clear and moist. No oropharyngeal exudate. Conjunctivae are normal.  No scleral icterus. Neck: Neck supple. Trachea midline. Cardiovascular: Normal rate, regular rhythm and intact distal pulses. No M/R/G Pulmonary/chest: Effort normal and breath sounds normal. No wheezing, rales or rhonchi. Abdominal: Soft, nontender, nondistended. Bowel sounds active throughout. There are no masses palpable. No hepatosplenomegaly. Extremities: no clubbing, cyanosis, or edema Lymphadenopathy: No cervical adenopathy noted. Neurological: Alert and oriented to person place and time. Skin: Skin is warm and dry. No rashes noted. Psychiatric: Normal mood and affect. Behavior is normal.  RELEVANT LABS AND IMAGING: CBC    Component Value Date/Time   WBC 4.2 10/24/2012 0225   RBC 4.05 10/24/2012 0225   HGB  10.9* 10/24/2012 0225   HCT 32.5* 10/24/2012 0225   PLT 108* 10/24/2012 0225   MCV 80.2 10/24/2012 0225   MCH 26.9 10/24/2012 0225   MCHC 33.5 10/24/2012 0225   RDW 14.7 10/24/2012 0225   LYMPHSABS 0.4* 09/06/2012 1337   MONOABS 0.4 09/06/2012 1337   EOSABS 0.0 09/06/2012 1337   BASOSABS 0.0 09/06/2012 1337    CMP     Component Value Date/Time   NA 140 10/24/2012 0225   K 3.5 10/24/2012 0225   CL 102 10/24/2012 0225   CO2 29 10/24/2012 0225   GLUCOSE 122* 10/24/2012 0225   BUN 33* 10/24/2012 0225   CREATININE 1.61* 10/24/2012 0225   CALCIUM 9.5 10/24/2012 0225   PROT 6.6 10/23/2012 1420   ALBUMIN 3.6 10/23/2012 1420   AST 13 10/23/2012 1420   ALT 6 10/23/2012 1420   ALKPHOS 60 10/23/2012 1420   BILITOT 0.3 10/23/2012 1420   GFRNONAA 30* 10/24/2012 0225   GFRAA 35* 10/24/2012 0225    ASSESSMENT/PLAN: 79 year old female with a past medical history of GERD status post Nissen fundoplication, chronic nausea, [name]'s esophagus, IBS, chronic constipation, CAD, hypertension, chronic diastolic CHF, macular degeneration who is seen  in consultation at the request of Particia Nearing, PA-C to evaluate epigastric pain and bloating.   1. Epigastric pain/bloating/chronic nausea -- considerations include recurrent hiatal hernia/slipped fundoplication, gallbladder disease, ulcer disease, bacterial overgrowth, less likely pancreatitis.  I recommended abdominal ultrasound to evaluate liver and gallbladder. Upper endoscopy to evaluate for recurrent hiatal hernia and exclude ulcer disease and inflammation as a source of pain. Discussed the test including the risks and benefits and she is agreeable to proceed. CMP, CBC and lipase today. I'm empirically treating for bacterial overgrowth with metronidazole 250 mg 3 times daily 7 days. Nausea is chronic and she states not a big issue for her given she's dealt with it for so long. She does not feel like she needs anti-emetic therapy.  2. Chronic constipation -- lifelong. Trial of Linzess 290 g daily.  3. CRC screening -- last colonoscopy 2008, would be due 2018. Screening may be discontinued secondary to age, can discuss at that time  4. [name]'s esophagus -- last endoscopy 2008, due for surveillance biopsies. Will be completed with upper endoscopy as discussed in #1, continue Nexium 40 mg daily

## 2014-02-21 ENCOUNTER — Ambulatory Visit (HOSPITAL_COMMUNITY)
Admission: RE | Admit: 2014-02-21 | Discharge: 2014-02-21 | Disposition: A | Discharge status: Home / Self Care | Payer: PRIVATE HEALTH INSURANCE | Source: Ambulatory Visit | Attending: Internal Medicine | Admitting: Internal Medicine

## 2014-02-21 DIAGNOSIS — R109 Unspecified abdominal pain: Secondary | ICD-10-CM | POA: Insufficient documentation

## 2014-02-21 DIAGNOSIS — K5909 Other constipation: Secondary | ICD-10-CM

## 2014-02-21 DIAGNOSIS — R140 Abdominal distension (gaseous): Secondary | ICD-10-CM

## 2014-02-21 DIAGNOSIS — K7689 Other specified diseases of liver: Secondary | ICD-10-CM | POA: Diagnosis not present

## 2014-02-21 DIAGNOSIS — N281 Cyst of kidney, acquired: Secondary | ICD-10-CM | POA: Diagnosis not present

## 2014-02-21 DIAGNOSIS — R1013 Epigastric pain: Secondary | ICD-10-CM

## 2014-03-09 ENCOUNTER — Telehealth: Payer: Self-pay

## 2014-03-09 ENCOUNTER — Ambulatory Visit (AMBULATORY_SURGERY_CENTER): Payer: PRIVATE HEALTH INSURANCE | Admitting: Internal Medicine

## 2014-03-09 ENCOUNTER — Other Ambulatory Visit: Payer: Self-pay

## 2014-03-09 ENCOUNTER — Encounter: Payer: Self-pay | Admitting: Internal Medicine

## 2014-03-09 VITALS — BP 156/62 | HR 60 | Temp 95.9°F | Resp 21 | Ht 62.0 in | Wt 169.0 lb

## 2014-03-09 DIAGNOSIS — R110 Nausea: Secondary | ICD-10-CM | POA: Diagnosis not present

## 2014-03-09 DIAGNOSIS — R1013 Epigastric pain: Secondary | ICD-10-CM | POA: Diagnosis not present

## 2014-03-09 DIAGNOSIS — K219 Gastro-esophageal reflux disease without esophagitis: Secondary | ICD-10-CM

## 2014-03-09 DIAGNOSIS — K3189 Other diseases of stomach and duodenum: Secondary | ICD-10-CM

## 2014-03-09 MED ORDER — LUBIPROSTONE 24 MCG PO CAPS: 24.0000 ug | ORAL_CAPSULE | Freq: Two times a day (BID) | ORAL | Status: DC

## 2014-03-09 MED ORDER — SODIUM CHLORIDE 0.9 % IV SOLN: 500.0000 mL | INTRAVENOUS | Status: DC

## 2014-03-09 MED ORDER — PEG-KCL-NACL-NASULF-NA ASC-C 100 G PO SOLR: 1.0000 | Freq: Once | ORAL | Status: DC

## 2014-03-09 NOTE — Progress Notes (Signed)
A/ox3 pleased with MAC, report to Annette RN 

## 2014-03-09 NOTE — Op Note (Signed)
[place]  [address]. [place], [id number]   ENDOSCOPY PROCEDURE REPORT  PATIENT: [name], [name]  MR#: #[id number] BIRTHDATE: February 22, 1935 , 79  yrs. old GENDER: female ENDOSCOPIST: Jerene Bears, MD REFERRED BY:  Particia Nearing, PA-C PROCEDURE DATE:  03/09/2014 PROCEDURE:  EGD w/ biopsy ASA CLASS:     Class III INDICATIONS:  epigastric pain, nausea, and bloating, and history of [name]'s esophagus. MEDICATIONS: Monitored anesthesia care and Propofol 100 mg IV TOPICAL ANESTHETIC: none  DESCRIPTION OF PROCEDURE: After the risks benefits and alternatives of the procedure were thoroughly explained, informed consent was obtained.  The LB [id number] endoscope was introduced through the mouth and advanced to the second portion of the duodenum , Without limitations.  The instrument was slowly withdrawn as the mucosa was fully examined.   ESOPHAGUS: The esophagus was tortuous in the mid and distal segments. The mucosa of the esophagus appeared normal.   There was no evidence of [name]'s esophagus.  The Z line was located at 30 cm from the incisors  STOMACH: A large hiatal hernia was noted with diaphragmatic hiatus at 38 cm from the incisors (8 cm hernia)..   Moderate gastritis characterized by erythema and hemorrhagic appearing mucosa (inflammation) was found in the gastric antrum.  Cold forcep biopsies were taken at the gastric body, antrum and angularis to evaluate for h.  pylori.   No ulcers were seen.  DUODENUM: The duodenal mucosa showed no abnormalities in the bulb and 2nd part of the duodenum.  Retroflexed views revealed a hiatal hernia as described above.  The scope was then withdrawn from the patient and the procedure completed.  COMPLICATIONS: There were no immediate complications.  ENDOSCOPIC IMPRESSION: 1.   The mucosa of the esophagus appeared normal without evidence of [name]'s esophagus 2.   Large hiatal hernia 3.   Gastritis  (inflammation) was found in the gastric antrum 4.   The duodenal mucosa showed no abnormalities in the bulb and 2nd part of the duodenum  RECOMMENDATIONS: 1.  Await biopsy results 2.  Continue taking your PPI (antiacid medicine) once daily.  It is best to be taken 20-30 minutes prior to breakfast meal..  eSigned:  Jerene Bears, MD 03/09/2014 8:59 AM    CC:The Patient, PCP  PATIENT NAME:  [name], [name] MR#: #[id number]

## 2014-03-09 NOTE — Telephone Encounter (Signed)
Pt scheduled for upper gi at [facility] 03/14/14@11am , pt to arrive there at 10:45am and be NPO after midnight. Scripts called to pharmacy for moviprep and amitiza per Dr. Hilarie Fredrickson. Pt aware.

## 2014-03-09 NOTE — Progress Notes (Signed)
No problems noted in the recovery room. maw 

## 2014-03-09 NOTE — Progress Notes (Signed)
Called to room to assist during endoscopic procedure.  Patient ID and intended procedure confirmed with present staff. Received instructions for my participation in the procedure from the performing physician.  

## 2014-03-09 NOTE — Patient Instructions (Signed)

## 2014-03-10 ENCOUNTER — Telehealth: Payer: Self-pay | Admitting: *Deleted

## 2014-03-10 ENCOUNTER — Encounter (HOSPITAL_COMMUNITY): Payer: Self-pay | Admitting: Cardiovascular Disease

## 2014-03-10 NOTE — Telephone Encounter (Signed)
  Follow up Call-  Call back number 03/09/2014  Post procedure Call Back phone  # [phone]  Permission to leave phone message Yes     Patient questions:  Do you have a fever, pain , or abdominal swelling? No. Pain Score  0 *  Have you tolerated food without any problems? Yes.    Have you been able to return to your normal activities? Yes.    Do you have any questions about your discharge instructions: Diet   No. Medications  No. Follow up visit  No.  Do you have questions or concerns about your Care? No.  Actions: * If pain score is 4 or above: No action needed, pain <4.

## 2014-03-14 ENCOUNTER — Ambulatory Visit (HOSPITAL_COMMUNITY)
Admission: RE | Admit: 2014-03-14 | Discharge: 2014-03-14 | Disposition: A | Discharge status: Home / Self Care | Payer: PRIVATE HEALTH INSURANCE | Source: Ambulatory Visit | Attending: Internal Medicine | Admitting: Internal Medicine

## 2014-03-14 ENCOUNTER — Encounter: Payer: Self-pay | Admitting: Internal Medicine

## 2014-03-14 ENCOUNTER — Other Ambulatory Visit: Payer: Self-pay | Admitting: Internal Medicine

## 2014-03-14 DIAGNOSIS — R1013 Epigastric pain: Secondary | ICD-10-CM

## 2014-03-14 DIAGNOSIS — K449 Diaphragmatic hernia without obstruction or gangrene: Secondary | ICD-10-CM | POA: Diagnosis not present

## 2014-03-21 ENCOUNTER — Telehealth: Payer: Self-pay

## 2014-03-21 ENCOUNTER — Encounter: Payer: Self-pay | Admitting: Internal Medicine

## 2014-03-21 NOTE — Telephone Encounter (Signed)
Pt called and states that she had to stop the amitiza. States she was having terrible stomach pain, nausea, red face, and unable to walk. She has not taken another pill since Thursday and states the side effects went away. Pt wanted to let Dr. Hilarie Fredrickson know. Dr. Hilarie Fredrickson notified.

## 2014-03-21 NOTE — Telephone Encounter (Signed)
She has severe constipation and has now had trouble with Linzess and lubiprostone I would begin MiraLAX 17 g twice daily to see if this helps with constipation, if not taken be increased to 3 times daily if stools are too loose can be decreased to once daily Have her keep me updated

## 2014-03-22 NOTE — Telephone Encounter (Signed)
Spoke with pt and she is aware.

## 2014-04-01 DIAGNOSIS — K5289 Other specified noninfective gastroenteritis and colitis: Secondary | ICD-10-CM

## 2014-04-01 HISTORY — DX: Other specified noninfective gastroenteritis and colitis: K52.89

## 2014-04-25 ENCOUNTER — Ambulatory Visit: Payer: PRIVATE HEALTH INSURANCE | Admitting: Internal Medicine

## 2014-05-01 ENCOUNTER — Other Ambulatory Visit: Payer: Self-pay | Admitting: Internal Medicine

## 2014-05-07 ENCOUNTER — Encounter (HOSPITAL_COMMUNITY): Payer: Self-pay | Admitting: Emergency Medicine

## 2014-05-07 ENCOUNTER — Emergency Department (HOSPITAL_COMMUNITY): Payer: Medicare Other

## 2014-05-07 ENCOUNTER — Observation Stay (HOSPITAL_COMMUNITY)
Admission: EM | Admit: 2014-05-07 | Discharge: 2014-05-08 | Disposition: A | Discharge status: Home / Self Care | Payer: Medicare Other | Attending: Cardiovascular Disease | Admitting: Cardiovascular Disease

## 2014-05-07 DIAGNOSIS — K219 Gastro-esophageal reflux disease without esophagitis: Secondary | ICD-10-CM | POA: Diagnosis not present

## 2014-05-07 DIAGNOSIS — Z961 Presence of intraocular lens: Secondary | ICD-10-CM | POA: Diagnosis not present

## 2014-05-07 DIAGNOSIS — Z9841 Cataract extraction status, right eye: Secondary | ICD-10-CM | POA: Insufficient documentation

## 2014-05-07 DIAGNOSIS — Z955 Presence of coronary angioplasty implant and graft: Secondary | ICD-10-CM | POA: Insufficient documentation

## 2014-05-07 DIAGNOSIS — I209 Angina pectoris, unspecified: Secondary | ICD-10-CM

## 2014-05-07 DIAGNOSIS — I272 Other secondary pulmonary hypertension: Secondary | ICD-10-CM | POA: Insufficient documentation

## 2014-05-07 DIAGNOSIS — R079 Chest pain, unspecified: Secondary | ICD-10-CM

## 2014-05-07 DIAGNOSIS — Z9889 Other specified postprocedural states: Secondary | ICD-10-CM | POA: Diagnosis not present

## 2014-05-07 DIAGNOSIS — Z8719 Personal history of other diseases of the digestive system: Secondary | ICD-10-CM | POA: Diagnosis not present

## 2014-05-07 DIAGNOSIS — F419 Anxiety disorder, unspecified: Secondary | ICD-10-CM | POA: Diagnosis not present

## 2014-05-07 DIAGNOSIS — E7800 Pure hypercholesterolemia, unspecified: Secondary | ICD-10-CM | POA: Diagnosis present

## 2014-05-07 DIAGNOSIS — I10 Essential (primary) hypertension: Secondary | ICD-10-CM | POA: Diagnosis not present

## 2014-05-07 DIAGNOSIS — Z87891 Personal history of nicotine dependence: Secondary | ICD-10-CM | POA: Insufficient documentation

## 2014-05-07 DIAGNOSIS — Z882 Allergy status to sulfonamides status: Secondary | ICD-10-CM | POA: Diagnosis not present

## 2014-05-07 DIAGNOSIS — Z9851 Tubal ligation status: Secondary | ICD-10-CM | POA: Diagnosis not present

## 2014-05-07 DIAGNOSIS — R0789 Other chest pain: Secondary | ICD-10-CM | POA: Diagnosis not present

## 2014-05-07 DIAGNOSIS — F329 Major depressive disorder, single episode, unspecified: Secondary | ICD-10-CM

## 2014-05-07 DIAGNOSIS — I480 Paroxysmal atrial fibrillation: Secondary | ICD-10-CM | POA: Diagnosis not present

## 2014-05-07 DIAGNOSIS — H3530 Unspecified macular degeneration: Secondary | ICD-10-CM | POA: Diagnosis not present

## 2014-05-07 DIAGNOSIS — I2510 Atherosclerotic heart disease of native coronary artery without angina pectoris: Secondary | ICD-10-CM | POA: Diagnosis present

## 2014-05-07 DIAGNOSIS — Z9842 Cataract extraction status, left eye: Secondary | ICD-10-CM | POA: Diagnosis not present

## 2014-05-07 DIAGNOSIS — R001 Bradycardia, unspecified: Secondary | ICD-10-CM | POA: Diagnosis present

## 2014-05-07 DIAGNOSIS — I509 Heart failure, unspecified: Secondary | ICD-10-CM | POA: Insufficient documentation

## 2014-05-07 DIAGNOSIS — R0600 Dyspnea, unspecified: Secondary | ICD-10-CM

## 2014-05-07 LAB — BASIC METABOLIC PANEL
Anion gap: 6 (ref 5–15)
BUN: 15 mg/dL (ref 6–23)
CALCIUM: 9.5 mg/dL (ref 8.4–10.5)
CO2: 26 mmol/L (ref 19–32)
CREATININE: 1.08 mg/dL (ref 0.50–1.10)
Chloride: 108 mmol/L (ref 96–112)
GFR, EST AFRICAN AMERICAN: 56 mL/min — AB (ref >90)
GFR, EST NON AFRICAN AMERICAN: 48 mL/min — AB (ref >90)
Glucose, Bld: 101 mg/dL — ABNORMAL HIGH (ref 70–99)
Potassium: 3.8 mmol/L (ref 3.5–5.1)
SODIUM: 140 mmol/L (ref 135–145)

## 2014-05-07 LAB — CBC
HCT: 36.9 % (ref 36.0–46.0)
HEMOGLOBIN: 12 g/dL (ref 12.0–15.0)
MCH: 26.2 pg (ref 26.0–34.0)
MCHC: 32.5 g/dL (ref 30.0–36.0)
MCV: 80.6 fL (ref 78.0–100.0)
Platelets: 200 10*3/uL (ref 150–400)
RBC: 4.58 MIL/uL (ref 3.87–5.11)
RDW: 13.9 % (ref 11.5–15.5)
WBC: 5.8 10*3/uL (ref 4.0–10.5)

## 2014-05-07 LAB — I-STAT TROPONIN, ED: Troponin i, poc: 0.02 ng/mL (ref 0.00–0.08)

## 2014-05-07 LAB — TROPONIN I: Troponin I: <0.03 ng/mL (ref <0.031)

## 2014-05-07 MED ORDER — AMLODIPINE BESYLATE 10 MG PO TABS
10.0000 mg | ORAL_TABLET | Freq: Every day | ORAL | Status: DC
  Administered 2014-05-08: 10 mg via ORAL
  Filled 2014-05-07: qty 1

## 2014-05-07 MED ORDER — ACETAMINOPHEN 325 MG PO TABS: 650.0000 mg | ORAL_TABLET | ORAL | Status: DC | PRN

## 2014-05-07 MED ORDER — TRAZODONE HCL 100 MG PO TABS
100.0000 mg | ORAL_TABLET | Freq: Every day | ORAL | Status: DC
  Administered 2014-05-07: 100 mg via ORAL
  Filled 2014-05-07 (×2): qty 1

## 2014-05-07 MED ORDER — ROSUVASTATIN CALCIUM 10 MG PO TABS
10.0000 mg | ORAL_TABLET | Freq: Every day | ORAL | Status: DC
  Administered 2014-05-07: 10 mg via ORAL
  Filled 2014-05-07 (×2): qty 1

## 2014-05-07 MED ORDER — FERROUS SULFATE 325 (65 FE) MG PO TBEC: 325.0000 mg | DELAYED_RELEASE_TABLET | Freq: Every day | ORAL | Status: DC

## 2014-05-07 MED ORDER — LOSARTAN POTASSIUM 50 MG PO TABS: 100.0000 mg | ORAL_TABLET | Freq: Every day | ORAL | Status: DC

## 2014-05-07 MED ORDER — ENOXAPARIN SODIUM 40 MG/0.4ML ~~LOC~~ SOLN
40.0000 mg | SUBCUTANEOUS | Status: DC
  Administered 2014-05-07: 40 mg via SUBCUTANEOUS
  Filled 2014-05-07 (×2): qty 0.4

## 2014-05-07 MED ORDER — ASPIRIN 81 MG PO CHEW
81.0000 mg | CHEWABLE_TABLET | Freq: Every evening | ORAL | Status: DC
  Filled 2014-05-07: qty 1

## 2014-05-07 MED ORDER — FERROUS SULFATE 325 (65 FE) MG PO TABS
325.0000 mg | ORAL_TABLET | Freq: Every day | ORAL | Status: DC
  Filled 2014-05-07 (×2): qty 1

## 2014-05-07 MED ORDER — VITAMIN D (ERGOCALCIFEROL) 1.25 MG (50000 UNIT) PO CAPS: 50000.0000 [IU] | ORAL_CAPSULE | ORAL | Status: DC

## 2014-05-07 MED ORDER — ALPRAZOLAM 0.25 MG PO TABS
0.1250 mg | ORAL_TABLET | Freq: Three times a day (TID) | ORAL | Status: DC | PRN
  Filled 2014-05-07: qty 1

## 2014-05-07 MED ORDER — ZOLPIDEM TARTRATE 5 MG PO TABS
5.0000 mg | ORAL_TABLET | Freq: Every day | ORAL | Status: DC
  Administered 2014-05-07: 5 mg via ORAL
  Filled 2014-05-07: qty 1

## 2014-05-07 MED ORDER — ALBUTEROL SULFATE (2.5 MG/3ML) 0.083% IN NEBU: 3.0000 mL | INHALATION_SOLUTION | Freq: Four times a day (QID) | RESPIRATORY_TRACT | Status: DC | PRN

## 2014-05-07 MED ORDER — LEVOFLOXACIN 500 MG PO TABS
500.0000 mg | ORAL_TABLET | Freq: Every day | ORAL | Status: DC
  Filled 2014-05-07: qty 1

## 2014-05-07 MED ORDER — ONDANSETRON HCL 4 MG/2ML IJ SOLN: 4.0000 mg | Freq: Four times a day (QID) | INTRAMUSCULAR | Status: DC | PRN

## 2014-05-07 MED ORDER — LORATADINE 10 MG PO TABS
10.0000 mg | ORAL_TABLET | Freq: Every day | ORAL | Status: DC
  Administered 2014-05-08: 10 mg via ORAL
  Filled 2014-05-07: qty 1

## 2014-05-07 MED ORDER — LOSARTAN POTASSIUM 50 MG PO TABS
100.0000 mg | ORAL_TABLET | Freq: Every day | ORAL | Status: DC
  Administered 2014-05-08: 100 mg via ORAL
  Filled 2014-05-07: qty 2

## 2014-05-07 MED ORDER — PANTOPRAZOLE SODIUM 40 MG PO TBEC: 80.0000 mg | DELAYED_RELEASE_TABLET | Freq: Every day | ORAL | Status: DC

## 2014-05-07 MED ORDER — CITALOPRAM HYDROBROMIDE 20 MG PO TABS
20.0000 mg | ORAL_TABLET | Freq: Every day | ORAL | Status: DC
  Administered 2014-05-07: 20 mg via ORAL
  Filled 2014-05-07 (×2): qty 1

## 2014-05-07 NOTE — ED Provider Notes (Signed)
CSN: [id number]     Arrival date & time    History   First MD Initiated Contact with Patient 05/07/14 1510     Chief Complaint  Patient presents with  . Chest Pain     (Consider location/radiation/quality/duration/timing/severity/associated sxs/prior Treatment) HPI Comments: Patient is a 80 year old female with a past medical history of hypertension, hiatal hernia, CAD with stent in the LAD placed in 2013, atrial fibrillation, pulmonary hypertension, and hypercholesterolemia who presents with chest pain that started last night. The pain started suddenly and remained constant until prior to arrival. The pain is described as a "heaviness" and located in her left chest with radiation to her left upper back. She reports associated nausea. Patient is pain free at after receiving nitro prior to arrival. No aggravating/alleviating factors. No other associated symptoms.    Past Medical History  Diagnosis Date  . Depressive disorder, not elsewhere classified   . Anxiety state, unspecified   . Pure hypercholesterolemia   . Esophageal reflux   . Infection of esophagostomy   . Congestive heart failure, unspecified     diastolic heart failure  . Pulmonary hypertension     PA systolic pressure 83-38 mmHg by echocardiogram, PA pressure 33/10 by cardiac catheterization PA saturation 62% thermodilution cardiac index 2.0 thick cardiac index 2.4  . Bradycardia     previously with coreg  . Tricuspid regurgitation     moderate tricuspid regurgitation by echocardiogram, prior use of anorexic agents  . Melanoma 1995    removal on back   . Coronary artery disease     Moderate to severe coronary disease involving the left anterior descending artery and right coronary artery.  Sequential stenosis in the LAD is significant.  Right coronary artery is moderate to severely likely nonischemic.,  Questionable small LVOT obstruction.  No Brockenbrough maneuver was performed.  Catheterization January 2013  . PONV  (postoperative nausea and vomiting)   . Hypertension   . Dysrhythmia   . H/O hiatal hernia   . History of percutaneous coronary intervention     Successful angioplasty and drug-eluting stent placed to an 80% lesion in the proximal LAD.,  Ostial lesion in the LAD was estimated to be 60% with an FFR ratio of 0.83, medical therapy was recommended for the latter lesion.  Dual antiplatelet therapy for one year.  Procedure performed May 08, 2011  . Gastritis   . [name]'s esophagus   . IBS (irritable bowel syndrome)   . Macular degeneration    Past Surgical History  Procedure Laterality Date  . Nissen fundoplication  2505  . Rotator cuff repair  ~ 2001    right  . Coronary angioplasty with stent placement  05/08/11    "1"  . Tonsillectomy  ~ 1944  . Shoulder arthroscopy  ~ 2004; 2005    left; "joint's wore out"  . Cataract extraction w/ intraocular lens  implant, bilateral  ~ 2002  . Tubal ligation  1972  . Percutaneous coronary stent intervention (pci-s) N/A 05/08/2011    Procedure: PERCUTANEOUS CORONARY STENT INTERVENTION (PCI-S);  Surgeon: Wellington Hampshire, MD;  Location: [facility] CATH [facility];  Service: Cardiovascular;  Laterality: N/A;   Family History  Problem Relation Age of Onset  . Heart attack Father   . Hypertension Father   . Diabetes Mother   . Hypertension Mother   . Colon cancer Neg Hx   . Esophageal cancer Neg Hx   . Rectal cancer Neg Hx   . Stomach cancer Neg  Hx    History  Substance Use Topics  . Smoking status: Former Smoker -- 1.00 packs/day for 20 years    Types: Cigarettes    Quit date: 04/26/1988  . Smokeless tobacco: Never Used  . Alcohol Use: No   OB History    No data available     Review of Systems  Constitutional: Negative for fever, chills and fatigue.  HENT: Negative for trouble swallowing.   Eyes: Negative for visual disturbance.  Respiratory: Positive for shortness of breath.   Cardiovascular: Positive for chest pain. Negative for palpitations.   Gastrointestinal: Negative for nausea, vomiting, abdominal pain and diarrhea.  Genitourinary: Negative for dysuria and difficulty urinating.  Musculoskeletal: Negative for arthralgias and neck pain.  Skin: Negative for color change.  Neurological: Negative for dizziness and weakness.  Psychiatric/Behavioral: Negative for dysphoric mood.      Allergies  Sulfamethoxazole  Home Medications   Prior to Admission medications   Medication Sig Start Date End Date Taking? Authorizing Provider  albuterol (PROAIR HFA) 108 (90 BASE) MCG/ACT inhaler Inhale 2 puffs into the lungs every 6 (six) hours as needed.    Historical Provider, MD  alendronate (FOSAMAX) 70 MG tablet Take 70 mg by mouth every 7 (seven) days. Take with a full glass of water on an empty stomach on Mondays    Historical Provider, MD  ALPRAZolam (XANAX) 0.25 MG tablet 0.25 mg 3 (three) times daily as needed for anxiety. Take 1/2 to 1 by mouth three times daily    Historical Provider, MD  amLODipine (NORVASC) 10 MG tablet Take 10 mg by mouth daily.    Historical Provider, MD  aspirin 81 MG chewable tablet Chew 81 mg by mouth every evening.    Historical Provider, MD  citalopram (CELEXA) 40 MG tablet Take 0.5 tablets (20 mg total) by mouth daily. 09/12/12   Nimish Luther Parody, MD  esomeprazole (NEXIUM) 40 MG capsule Take 40 mg by mouth daily before breakfast.    Historical Provider, MD  ferrous sulfate 325 (65 FE) MG EC tablet Take 325 mg by mouth daily with breakfast.      Historical Provider, MD  LINZESS 290 MCG CAPS capsule TAKE ONE (1) CAPSULE EACH DAY 05/02/14   Jerene Bears, MD  loratadine (CLARITIN) 10 MG tablet Take 10 mg by mouth daily.    Historical Provider, MD  losartan (COZAAR) 100 MG tablet Take 1 tablet (100 mg total) by mouth daily. 11/11/12   Herminio Commons, MD  lubiprostone (AMITIZA) 24 MCG capsule Take 1 capsule (24 mcg total) by mouth 2 (two) times daily with a meal. 03/09/14   Jerene Bears, MD  Multiple  Vitamins-Minerals (PRESERVISION AREDS) TABS Take 2 tablets by mouth daily.    Historical Provider, MD  peg 3350 powder (MOVIPREP) 100 G SOLR Take 1 kit (200 g total) by mouth once. 03/09/14   Jerene Bears, MD  Propylene Glycol (SYSTANE BALANCE OP) Place 1-2 drops into both eyes daily as needed (dry eyes).    Historical Provider, MD  rosuvastatin (CRESTOR) 10 MG tablet Take 10 mg by mouth at bedtime.     Historical Provider, MD  traZODone (DESYREL) 100 MG tablet Take 100 mg by mouth at bedtime.     Historical Provider, MD  Vitamin D, Ergocalciferol, (DRISDOL) 50000 UNITS CAPS Take 50,000 Units by mouth every 7 (seven) days. Patient takes on Tuesdays    Historical Provider, MD  zolpidem (AMBIEN) 10 MG tablet Take 10 mg by mouth at  bedtime.      Historical Provider, MD   BP 130/106 mmHg  Pulse 62  Temp(Src) 97.5 F (36.4 C)  Resp 25  Ht 5' 2"  (1.575 m)  Wt 166 lb (75.297 kg)  BMI 30.35 kg/m2  SpO2 100% Physical Exam  Constitutional: She is oriented to person, place, and time. She appears well-developed and well-nourished. No distress.  HENT:  Head: Normocephalic and atraumatic.  Eyes: Conjunctivae and EOM are normal.  Neck: Normal range of motion.  Cardiovascular: Normal rate and regular rhythm.  Exam reveals no gallop and no friction rub.   No murmur heard. Pulmonary/Chest: Effort normal and breath sounds normal. She has no wheezes. She has no rales. She exhibits no tenderness.  Abdominal: Soft. She exhibits no distension. There is no tenderness. There is no rebound.  Musculoskeletal: Normal range of motion.  No lower extremity edema or calf tenderness to palpation.   Neurological: She is alert and oriented to person, place, and time. Coordination normal.  Speech is goal-oriented. Moves limbs without ataxia.   Skin: Skin is warm and dry.  Psychiatric: She has a normal mood and affect. Her behavior is normal.  Nursing note and vitals reviewed.   ED Course  Procedures (including  critical care time) Labs Review Labs Reviewed  BASIC METABOLIC PANEL - Abnormal; Notable for the following:    Glucose, Bld 101 (*)    GFR calc non Af Amer 48 (*)    GFR calc Af Amer 56 (*)    All other components within normal limits  CBC  TROPONIN I  TROPONIN I  TROPONIN I  CBC  CREATININE, SERUM  I-STAT TROPOININ, ED    Imaging Review Dg Chest Port 1 View  05/07/2014   CLINICAL DATA:  Left-sided chest pain began yesterday afternoon, worse today. Difficulty breathing, cough. No radiation.  EXAM: PORTABLE CHEST - 1 VIEW  COMPARISON:  10/23/2012.  FINDINGS: Trachea is deviated slightly to the right at the level of the aortic arch, unchanged. Heart is enlarged. Large hiatal hernia with an air-fluid level. Lungs are clear. No pleural fluid.  IMPRESSION: 1. No acute findings. 2. Large hiatal hernia.   Electronically Signed   By: Lorin Picket M.D.   On: 05/07/2014 15:36     EKG Interpretation   Date/Time:  Saturday May 07 2014 14:03:58 EST Ventricular Rate:  57 PR Interval:  145 QRS Duration: 100 QT Interval:  463 QTC Calculation: 451 R Axis:   36 Text Interpretation:  Sinus rhythm Improved rate from prior Confirmed by  [place] ([id number]) on 05/07/2014 2:47:53 PM      MDM   Final diagnoses:  None    3:36 PM Labs pending. EKG unremarkable for acute changes.   7:32 PM Labs unremarkable for acute changes. Troponin negative. Cardiology will see the patient.    Alvina Chou, PA-C 05/07/14 2133  Charlesetta Shanks, MD 05/07/14 [phone]

## 2014-05-07 NOTE — ED Notes (Signed)
Per ems, pt c/o chest pain x 2 days and shortness of breath x 1 month.  Pt received 324 asa and 3 nitro pta w/relief of chest pain.  Initial pain was 7/10, now reports 0/10
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
## 2014-05-07 NOTE — ED Notes (Signed)
Pt undressed, in gown, on monitor, continuous pulse oximetry and blood pressure cuff 

## 2014-05-08 ENCOUNTER — Encounter (HOSPITAL_COMMUNITY): Payer: Self-pay | Admitting: Nurse Practitioner

## 2014-05-08 ENCOUNTER — Observation Stay (HOSPITAL_COMMUNITY): Payer: Medicare Other

## 2014-05-08 DIAGNOSIS — E780 Pure hypercholesterolemia: Secondary | ICD-10-CM

## 2014-05-08 DIAGNOSIS — R079 Chest pain, unspecified: Secondary | ICD-10-CM

## 2014-05-08 DIAGNOSIS — R0789 Other chest pain: Secondary | ICD-10-CM

## 2014-05-08 DIAGNOSIS — R001 Bradycardia, unspecified: Secondary | ICD-10-CM

## 2014-05-08 DIAGNOSIS — I2510 Atherosclerotic heart disease of native coronary artery without angina pectoris: Secondary | ICD-10-CM

## 2014-05-08 DIAGNOSIS — R0609 Other forms of dyspnea: Secondary | ICD-10-CM

## 2014-05-08 DIAGNOSIS — K219 Gastro-esophageal reflux disease without esophagitis: Secondary | ICD-10-CM

## 2014-05-08 DIAGNOSIS — I480 Paroxysmal atrial fibrillation: Secondary | ICD-10-CM | POA: Diagnosis not present

## 2014-05-08 DIAGNOSIS — I10 Essential (primary) hypertension: Secondary | ICD-10-CM | POA: Diagnosis not present

## 2014-05-08 LAB — TROPONIN I

## 2014-05-08 LAB — BRAIN NATRIURETIC PEPTIDE: B NATRIURETIC PEPTIDE 5: 182.8 pg/mL — AB (ref 0.0–100.0)

## 2014-05-08 MED ORDER — PANTOPRAZOLE SODIUM 40 MG PO TBEC
40.0000 mg | DELAYED_RELEASE_TABLET | Freq: Every day | ORAL | Status: DC
  Administered 2014-05-08: 40 mg via ORAL
  Filled 2014-05-08: qty 1

## 2014-05-08 MED ORDER — TECHNETIUM TC 99M SESTAMIBI GENERIC - CARDIOLITE
10.0000 | Freq: Once | INTRAVENOUS | Status: AC | PRN
  Administered 2014-05-08: 10 via INTRAVENOUS

## 2014-05-08 MED ORDER — REGADENOSON 0.4 MG/5ML IV SOLN: 0.4000 mg | Freq: Once | INTRAVENOUS | Status: AC

## 2014-05-08 MED ORDER — REGADENOSON 0.4 MG/5ML IV SOLN
0.4000 mg | Freq: Once | INTRAVENOUS | Status: AC
  Administered 2014-05-08: 0.4 mg via INTRAVENOUS
  Filled 2014-05-08: qty 5

## 2014-05-08 MED ORDER — REGADENOSON 0.4 MG/5ML IV SOLN
INTRAVENOUS | Status: AC
  Filled 2014-05-08: qty 5

## 2014-05-08 MED ORDER — TECHNETIUM TC 99M SESTAMIBI - CARDIOLITE
30.0000 | Freq: Once | INTRAVENOUS | Status: AC | PRN
  Administered 2014-05-08: 30 via INTRAVENOUS

## 2014-05-08 NOTE — Progress Notes (Signed)
Utilization Review Completed.   Elvira Langston, RN, BSN Nurse [employer] Manager  

## 2014-05-08 NOTE — Progress Notes (Signed)
05/08/2014 5:00 PM  Discharge AVS meds taken today and those due this evening reviewed.  Follow-up appointments and when to call md reviewed.  D/C IV and TELE.  Questions and concerns addressed.   D/C home per orders. [place]
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
## 2014-05-08 NOTE — Progress Notes (Signed)
Patient Name: [name] Date of Encounter: 05/08/2014     Active Problems:   Chest pain    SUBJECTIVE  No CP. Some SOB.   CURRENT MEDS . amLODipine  10 mg Oral Daily  . aspirin  81 mg Oral QPM  . citalopram  20 mg Oral QHS  . enoxaparin (LOVENOX) injection  40 mg Subcutaneous Q24H  . ferrous sulfate  325 mg Oral Q breakfast  . loratadine  10 mg Oral Daily  . losartan  100 mg Oral Daily  . pantoprazole  80 mg Oral Q1200  . rosuvastatin  10 mg Oral QHS  . traZODone  100 mg Oral QHS  . [START ON 05/10/2014] Vitamin D (Ergocalciferol)  50,000 Units Oral Q Tue  . zolpidem  5 mg Oral QHS    OBJECTIVE  Filed Vitals:   05/07/14 1940 05/07/14 2000 05/08/14 0358 05/08/14 0556  BP: 144/88 159/71 146/48 132/57  Pulse: 64 68 58 61  Temp: 99 F (37.2 C)  97.7 F (36.5 C) 98.3 F (36.8 C)  TempSrc: Oral  Oral Oral  Resp: 13 15 16 17   Height:      Weight:      SpO2: 99% 99% 96% 96%   No intake or output data in the 24 hours ending 05/08/14 0717 Filed Weights   05/07/14 1404  Weight: 166 lb (75.297 kg)    PHYSICAL EXAM  General: Pleasant, NAD. Neuro: Alert and oriented X 3. Moves all extremities spontaneously. Psych: Normal affect. HEENT:  Normal  Neck: Supple without bruits or JVD. Lungs:  Resp regular and unlabored, CTA. Heart: RRR no s3, s4, or murmurs. Abdomen: Soft, non-tender, non-distended, BS + x 4.  Extremities: No clubbing, cyanosis or edema. DP/PT/Radials 2+ and equal bilaterally.  Accessory Clinical Findings  CBC  Recent Labs  05/07/14 1517  WBC 5.8  HGB 12.0  HCT 36.9  MCV 80.6  PLT 161   Basic Metabolic Panel  Recent Labs  05/07/14 1517  NA 140  K 3.8  CL 108  CO2 26  GLUCOSE 101*  BUN 15  CREATININE 1.08  CALCIUM 9.5   Cardiac Enzymes  Recent Labs  05/07/14 2220 05/08/14 0025 05/08/14 0330  TROPONINI <0.03 <0.03 <0.03    TELE  NSR with freq PVCs, PACs and one 3 beat run of NSVT  Radiology/Studies  Dg Chest  Port 1 View  05/07/2014   CLINICAL DATA:  Left-sided chest pain began yesterday afternoon, worse today. Difficulty breathing, cough. No radiation.  EXAM: PORTABLE CHEST - 1 VIEW  COMPARISON:  10/23/2012.  FINDINGS: Trachea is deviated slightly to the right at the level of the aortic arch, unchanged. Heart is enlarged. Large hiatal hernia with an air-fluid level. Lungs are clear. No pleural fluid.  IMPRESSION: 1. No acute findings. 2. Large hiatal hernia.   Electronically Signed   By: Lorin Picket M.D.   On: 05/07/2014 15:36    ASSESSMENT AND PLAN 22F with depression, anxiety, HFpEF, HTN, pAF (remote), hiatal hernia despite Nissen fundoplication, and CAD s/p pLAD PCI (DES, 2013) who presents with chest pain in the setting of worsened dyspnea. The chest pain is fairly typical in many regards and the patient has a history of CAD and a borderline lesion (by FFR) at the LAD ostium that was not treated at the time of her cath in 2013. She also has a history of progressive dyspnea without other symptoms that are suggestive of HF (e.g. No PND, orthopnea, edema).  Chest  pain -- continue aspirin, crestor -- no BB due to history of bradycardia -- Troponin neg x3. ECG with no acute ST or TW changes.  -- Plan for nuclear stress test this AM.  HTN -- Continue norvasc and losartan  Dyspnea- worsening since December -- BNP minimally elevated at 182.8  -- Ischemia eval as per above -- Consider outpatient pulm evaluation if  stress test unrevealing -- Placed on Levaquin by her PCP, this was discontinued given low probability of infectious etiology   Paroxysmal AF. This is apparently a remote diagnosis with no recent recurrences.  -- Currently maintaining NSR -- If any evidence of recurrence, would need [place] given CHADSVASC of 6 (CHF, HTN, female, Age > 51, vasc dz)   Depression, anxiety, possible prescription drug abuse, family issues. 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  No further CP and cardiac enzymes are normal.  BNP slightly elevated but lungs are clear.  She has a large hiatal hernia on chest xray which may be causing her CP and SOB.  Will add Protonix 40mg  daily.  Await nuclear stress test later today.  Signed: Fransico Him, MD 05/08/2014

## 2014-05-08 NOTE — Discharge Instructions (Signed)
***  PLEASE REMEMBER TO BRING ALL OF YOUR MEDICATIONS TO EACH OF YOUR FOLLOW-UP OFFICE VISITS.  

## 2014-05-11 ENCOUNTER — Ambulatory Visit (INDEPENDENT_AMBULATORY_CARE_PROVIDER_SITE_OTHER)
Admission: RE | Admit: 2014-05-11 | Discharge: 2014-05-11 | Disposition: A | Discharge status: Home / Self Care | Payer: 59 | Source: Ambulatory Visit | Attending: Internal Medicine | Admitting: Internal Medicine

## 2014-05-11 ENCOUNTER — Encounter: Payer: Self-pay | Admitting: Internal Medicine

## 2014-05-11 ENCOUNTER — Ambulatory Visit (INDEPENDENT_AMBULATORY_CARE_PROVIDER_SITE_OTHER): Payer: 59 | Admitting: Internal Medicine

## 2014-05-11 VITALS — BP 132/80 | HR 64 | Ht 62.0 in | Wt 164.0 lb

## 2014-05-11 DIAGNOSIS — M5489 Other dorsalgia: Secondary | ICD-10-CM

## 2014-05-11 DIAGNOSIS — R0602 Shortness of breath: Secondary | ICD-10-CM

## 2014-05-11 DIAGNOSIS — K449 Diaphragmatic hernia without obstruction or gangrene: Secondary | ICD-10-CM

## 2014-05-11 MED ORDER — METOCLOPRAMIDE HCL 5 MG PO TABS: ORAL_TABLET | ORAL | Status: DC

## 2014-05-11 NOTE — Patient Instructions (Signed)
You have been scheduled for an esophageal manometry at [facility] on 06/06/14 at 10:30 am . Please arrive 30 minutes prior to your procedure for registration. You will need to go to outpatient registration (1st floor of the hospital) first. Make certain to bring your insurance cards as well as a complete list of medications.  Please remember the following:  1) Nothing to eat or drink after 12:00 midnight on the night before your test.  2) Hold all diabetic medications/insulin the morning of the test. You may eat and take your medications after the test.  3) For 3 days prior to your test do not take: Dexilant, Prevacid, Nexium, Protonix, Aciphex, Zegerid, Pantoprazole, Prilosec or omeprazole.  4) For 2 days prior to your test, do not take: Reglan, Tagamet, Zantac, Axid or Pepcid.  5) You MAY use an antacid such as Rolaids or Tums up to 12 hours prior to your test.  It will take at least 2 weeks to receive the results of this test from your physician. ------------------------------------------ ABOUT ESOPHAGEAL MANOMETRY Esophageal manometry (muh-NOM-uh-tree) is a test that gauges how well your esophagus works. Your esophagus is the long, muscular tube that connects your throat to your stomach. Esophageal manometry measures the rhythmic muscle contractions (peristalsis) that occur in your esophagus when you swallow. Esophageal manometry also measures the coordination and force exerted by the muscles of your esophagus.  During esophageal manometry, a thin, flexible tube (catheter) that contains sensors is passed through your nose, down your esophagus and into your stomach. Esophageal manometry can be helpful in diagnosing some mostly uncommon disorders that affect your esophagus.  Why it's done Esophageal manometry is used to evaluate the movement (motility) of food through the esophagus and into the stomach. The test measures how well the circular bands of muscle (sphincters) at the top and  bottom of your esophagus open and close, as well as the pressure, strength and pattern of the wave of esophageal muscle contractions that moves food along.  What you can expect Esophageal manometry is an outpatient procedure done without sedation. Most people tolerate it well. You may be asked to change into a hospital gown before the test starts.  During esophageal manometry  While you are sitting up, a member of your health care team sprays your throat with a numbing medication or puts numbing gel in your nose or both.  A catheter is guided through your nose into your esophagus. The catheter may be sheathed in a water-filled sleeve. It doesn't interfere with your breathing. However, your eyes may water, and you may gag. You may have a slight nosebleed from irritation.  After the catheter is in place, you may be asked to lie on your back on an exam table, or you may be asked to remain seated.  You then swallow small sips of water. As you do, a computer connected to the catheter records the pressure, strength and pattern of your esophageal muscle contractions.  During the test, you'll be asked to breathe slowly and smoothly, remain as still as possible, and swallow only when you're asked to do so.  A member of your health care team may move the catheter down into your stomach while the catheter continues its measurements.  The catheter then is slowly withdrawn. The test usually lasts 20 to 30 minutes.  After esophageal manometry  When your esophageal manometry is complete, you may return to your normal activities  This test typically takes 30-45 minutes to complete. ________________________________________________________________________________  Your  provider has requested that you have an thoracic x [place] before leaving today. Please go to the basement floor to our Radiology department for the test.  We have sent the following medications to your pharmacy for you to pick up at your  convenience: Reglan 5 mg 2-4 tablets daily as needed  You have been scheduled for an appointment with Dr Lake Bells at [facility] (2nd floor) on 06/09/14 at 4:15 pm.  Please purchase the following medications over the counter and take as directed: Ensure OR Boost  CC:Dr McQuaid, Dr Zella Richer, Dr Particia Nearing

## 2014-05-11 NOTE — Progress Notes (Signed)
Subjective:    Patient ID: [name], female    DOB: 04/19/1934, 80 y.o.   MRN: [id number]  HPI [name] is a 80 yo female with PMH of large hiatal hernia, GERD, chronic nausea, IBS, chronic constipation, CAD, hypertension, chronic diastolic heart failure who is seen in follow-up. She was initially seen in November 2015 to evaluate epigastric pain and bloating. Upper endoscopy was performed on 03/09/2014 which showed a tortuous esophagus and a large 8 cm hiatal hernia. There was moderate gastritis which was found to be reactive gastropathy after biopsy. No H. pylori, metaplasia or dysplasia. The examined duodenum was normal. Following this she had an upper GI series which showed again large hiatal hernia with the majority of stomach located above the diaphragmatic hiatus. She also tried Linzess and lubiprostone with no response for chronic constipation.  She was hospitalized very recently with epigastric and chest pressure and discomfort. She ruled out for myocardial infarction and had a negative stress test. She has continued to have epigastric and mid to lower chest pressure and discomfort. This is relieved by lying down. It is worsened significantly by eating. She has avoided eating due to the increased discomfort. She is also noted increased shortness of breath, particularly with exertion. Is particularly hard to take a deep breath after she has eaten. It started as congestion and she was treated with antibiotics for several days without improvement. Chest x-Dodie during hospitalization was negative for pneumonia or overt abnormalities. She's also noted mid back pain around her bra line for the last few days. This pain is somewhat severe at times. She denies falling. Bowel movements are still infrequent and she denies rectal bleeding or melena.  Last colonoscopy 2008 normal though tortuous performed by Dr. Sharlett Iles  Review of Systems  as per history of present illness, otherwise  negative  Current Medications, Allergies, Past Medical History, Past Surgical History, Family History and Social History were reviewed in [facility] record.     Objective:   Physical Exam BP 132/80 mmHg  Pulse 64  Ht 5\' 2"  (1.575 m)  Wt 164 lb (74.39 kg)  BMI 29.99 kg/m2  SpO2 98% Constitutional: Well-developed and well-nourished. No distress. HEENT: Normocephalic and atraumatic. Oropharynx is clear and moist. No oropharyngeal exudate. Conjunctivae are normal.  No scleral icterus. Neck: Neck supple. Trachea midline. Cardiovascular: Normal rate, regular rhythm and intact distal pulses. No M/R/G Pulmonary/chest: Effort normal and breath sounds normal. No wheezing, rales or rhonchi. Abdominal: Soft, epigastric tenderness without rebound or guarding, nondistended. Bowel sounds active throughout.  Extremities: no clubbing, cyanosis, or edema Lymphadenopathy: No cervical adenopathy noted. Neurological: Alert and oriented to person place and time. Skin: Skin is warm and dry. No rashes noted. Psychiatric: Normal mood and affect. Behavior is normal.  Upper GI series and complete abdominal ultrasound from December and November 2015, respectively reviewed. EGD reviewed with the patient along with pathology results  CBC    Component Value Date/Time   WBC 5.8 05/07/2014 1517   RBC 4.58 05/07/2014 1517   HGB 12.0 05/07/2014 1517   HCT 36.9 05/07/2014 1517   PLT 200 05/07/2014 1517   MCV 80.6 05/07/2014 1517   MCH 26.2 05/07/2014 1517   MCHC 32.5 05/07/2014 1517   RDW 13.9 05/07/2014 1517   LYMPHSABS 1.3 02/17/2014 1228   MONOABS 0.3 02/17/2014 1228   EOSABS 0.1 02/17/2014 1228   BASOSABS 0.0 02/17/2014 1228    CMP     Component Value Date/Time  NA 140 05/07/2014 1517   K 3.8 05/07/2014 1517   CL 108 05/07/2014 1517   CO2 26 05/07/2014 1517   GLUCOSE 101* 05/07/2014 1517   BUN 15 05/07/2014 1517   CREATININE 1.08 05/07/2014 1517   CALCIUM 9.5  05/07/2014 1517   PROT 6.2 02/17/2014 1228   ALBUMIN 4.0 02/17/2014 1228   AST 14 02/17/2014 1228   ALT 13 02/17/2014 1228   ALKPHOS 62 02/17/2014 1228   BILITOT 0.6 02/17/2014 1228   GFRNONAA 48* 05/07/2014 1517   GFRAA 56* 05/07/2014 1517      Assessment & Plan:  80 yo female with PMH of large hiatal hernia, GERD, chronic nausea, IBS, chronic constipation, CAD, hypertension, chronic diastolic heart failure who is seen in follow-up.   1. Symptomatic large hiatal hernia -- based on the nature of her symptoms and very large hernia seen recently at endoscopy and with imaging, it is most likely symptomatic hiatal hernia. I would like to refer her to Dr. Lake Bells with pulmonary to exclude other causes of dyspnea and for preoperative evaluation for possible hernia repair. I'm referring her to Dr. Zella Richer with general surgery to discuss hiatal hernia repair as she is quite symptomatic and I do not have any great medical options. She previously had a hernia repair in 1993. I have ordered an esophageal manometry as part of the preoperative evaluation. I have encouraged her to drink boost or ensure to help supplement her nutrition given that she has avoided eating and is having a hard time eating large meals. Finally after a thorough discussion of the potential risks and downside of metoclopramide therapy, I have prescribed metoclopramide 5 mg initially to start twice daily but can be increased to 4 times daily if the official. Hopefully this medication will benefit nausea and also some of her upper abdominal fullness and pressure associated with eating. She is asked to notify me should she develop any tremor, headache, confusion, or any other side effect of this therapy. She and her sister voiced understanding.  2. Mid back pain -- thoracic spine films today to exclude compression fracture. No evidence of rash to suggest shingles, pain is also bilateral making this less likely.  Over 40 minutes spent  with the patient and her sister today of which at least 50% of the time was discussed counseling the above issues

## 2014-05-29 ENCOUNTER — Other Ambulatory Visit: Payer: Self-pay | Admitting: Internal Medicine

## 2014-06-06 ENCOUNTER — Encounter (HOSPITAL_COMMUNITY)
Admission: RE | Disposition: A | Discharge status: Home / Self Care | Payer: Self-pay | Source: Ambulatory Visit | Attending: Internal Medicine

## 2014-06-06 ENCOUNTER — Ambulatory Visit (HOSPITAL_COMMUNITY)
Admission: RE | Admit: 2014-06-06 | Discharge: 2014-06-06 | Disposition: A | Discharge status: Home / Self Care | Payer: Medicare Other | Source: Ambulatory Visit | Attending: Internal Medicine | Admitting: Internal Medicine

## 2014-06-06 DIAGNOSIS — K219 Gastro-esophageal reflux disease without esophagitis: Secondary | ICD-10-CM

## 2014-06-06 DIAGNOSIS — K449 Diaphragmatic hernia without obstruction or gangrene: Secondary | ICD-10-CM | POA: Diagnosis present

## 2014-06-06 DIAGNOSIS — R0600 Dyspnea, unspecified: Secondary | ICD-10-CM | POA: Insufficient documentation

## 2014-06-06 HISTORY — PX: ESOPHAGEAL MANOMETRY: SHX5429

## 2014-06-06 SURGERY — MANOMETRY, ESOPHAGUS

## 2014-06-06 MED ORDER — LIDOCAINE VISCOUS 2 % MT SOLN
OROMUCOSAL | Status: AC
  Filled 2014-06-06: qty 15

## 2014-06-06 SURGICAL SUPPLY — 2 items
FACESHIELD LNG OPTICON STERILE (SAFETY) IMPLANT
GLOVE BIO SURGEON STRL SZ8 (GLOVE) ×6 IMPLANT

## 2014-06-07 ENCOUNTER — Encounter (INDEPENDENT_AMBULATORY_CARE_PROVIDER_SITE_OTHER): Payer: Self-pay | Admitting: General Surgery

## 2014-06-07 ENCOUNTER — Encounter (HOSPITAL_COMMUNITY): Payer: Self-pay | Admitting: Internal Medicine

## 2014-06-07 ENCOUNTER — Ambulatory Visit: Payer: Medicare Other | Admitting: Cardiovascular Disease

## 2014-06-07 NOTE — Progress Notes (Unsigned)
Patient ID: [name], female   DOB: [birth year], 80 y.o.   MRN: [id number]  [name] 06/07/2014 9:15 AM Location: [facility] Patient #: [id number] DOB: Oct 06, 1934 Divorced / Language: [name] / Race: White Female History of Present Illness [name]; 06/07/2014 11:36 AM) Patient words: hernia.  The patient is a 80 year old female    Note:She is referred by Dr. Hilarie Fredrickson because of a symptomatic recurrent hiatal hernia. She underwent open hiatal hernia repair and Nissen fundoplication by [name] in 1993. She is very active and in the fall she noted that she was becoming short of breath while exercising. She also developed epigastric bloating and discomfort after eating. She saw Dr. Raquel James and EGD showed a hiatal hernia which was also demonstrated on an UGI. She has also had a full cardiac workup because of her symptoms that ruled out a cardiac cause. She is scheduled to see an Pulmonologist later this week to evaluate her for her dyspnea. She has gained weight over the past year. No significant reflux symptoms. Reglan has not helped her symptoms. She is interested in repair of her recurrent hiatal hernia. She had esophageal manometry done yesterday, the results are pending.  Other Problems Davy Pique Bynum, CMA; 06/07/2014 10:06 AM) Depression Gastroesophageal Reflux Disease High blood pressure Hypercholesterolemia Melanoma  Past Surgical History Marjean Donna, CMA; 06/07/2014 10:06 AM) Cataract Surgery Bilateral. Knee Surgery Left. Resection of Stomach Shoulder Surgery Bilateral. Tonsillectomy  Diagnostic Studies History Marjean Donna, CMA; 06/07/2014 10:06 AM) Colonoscopy 5-10 years ago Mammogram within last year Pap Smear 1-5 years ago  Allergies Marjean Donna, CMA; 06/07/2014 9:16 AM) Sulfa Antibiotics  Medication History (Sonya Bynum, CMA; 06/07/2014 9:21 AM) Albuterol Sulfate ER (8MG  Tablet ER 12HR, Oral) Active. Fosamax (70MG  Tablet,  Oral) Active. Xanax (0.25MG  Tablet, Oral) Active. AmLODIPine Besylate (10MG  Tablet, Oral) Active. Aspirin EC (81MG  Tablet DR, Oral) Active. Citalopram Hydrobromide (40MG  Tablet, Oral) Active. NexIUM (40MG  Capsule DR, Oral) Active. Ferrous Sulfate (325 (65 Fe)MG Tablet, Oral) Active. Claritin (10MG  Tablet, Oral) Active. Losartan Potassium (100MG  Tablet, Oral) Active. Multivitamins (Oral) Active. Systane Balance (0.6% Solution, Ophthalmic as needed) Active. TraZODone HCl (100MG  Tablet, Oral) Active. Vitamin D3 (5000UNIT Tablet Chewable, Oral) Active. Zolpidem Tartrate (10MG  Tablet, Oral) Active. Metoclopramide HCl (5MG  Tablet, Oral) Active. Medications Reconciled  Social History Marjean Donna, CMA; 06/07/2014 10:06 AM) Alcohol use Remotely quit alcohol use. Caffeine use Coffee. No drug use Tobacco use Former smoker.  Family History Marjean Donna, [place]; 06/07/2014 10:06 AM) Alcohol Abuse Father. Hypertension Mother. Melanoma Brother, Sister. Prostate Cancer Brother.  Pregnancy / Birth History Marjean Donna, [place]; 06/07/2014 10:06 AM) Age at menarche 83 years. Age of menopause 15-55 Gravida 3 Maternal age 62-25 Para 3     Review of Systems ([place]; 06/07/2014 10:06 AM) General Not Present- Appetite Loss, Chills, Fatigue, Fever, Night Sweats, Weight Gain and Weight Loss. Skin Not Present- Change in Wart/Mole, Dryness, Hives, Jaundice, New Lesions, Non-Healing Wounds, Rash and Ulcer. Respiratory Not Present- Bloody sputum, Chronic Cough, Difficulty Breathing, Snoring and Wheezing. Breast Not Present- Breast Mass, Breast Pain, Nipple Discharge and Skin Changes. Gastrointestinal Present- Abdominal Pain, Bloating and Nausea. Not Present- Bloody Stool, Change in Bowel Habits, Chronic diarrhea, Constipation, Difficulty Swallowing, Excessive gas, Gets full quickly at meals, Hemorrhoids, Indigestion, Rectal Pain and Vomiting. Psychiatric Present- Anxiety and  Depression. Not Present- Bipolar, Change in Sleep Pattern, Fearful and Frequent crying. Hematology Present- Easy Bruising. Not Present- Excessive bleeding, Gland problems, HIV and Persistent Infections.  Vitals (Sonya Bynum CMA; 06/07/2014 9:16 AM) 06/07/2014 9:15 AM Weight: 168 lb Height: 62in Body Surface Area: 1.83 m Body Mass Index: 30.73 kg/m Temp.: 97.31F(Temporal)  Pulse: 76 (Regular)  BP: 136/80 (Sitting, Left Arm, Standard)     Physical Exam [name]; 06/07/2014 11:37 AM)  The physical exam findings are as follows: Note:General: Overweight female in NAD. Pleasant and cooperative.  HEENT: [place]/AT, no facial masses  EYES: EOMI, no icterus  CV: RRR, no murmur, no JVD.  CHEST: Breath sounds distant but equal and clear. Respirations nonlabored.  BREASTS: Symmetrical in size. No dominant masses, nipple discharge or suspicious skin lesions.  ABDOMEN: Soft, nontender, nondistended, no masses, no organomegaly, active bowel sounds, upper midline scar, no hernias.  MUSCULOSKELETAL: FROM, good muscle tone, no edema, no venous stasis changes  SKIN: No jaundice or suspicious rashes.  NEUROLOGIC: Alert and oriented, answers questions appropriately.  PSYCHIATRIC: Normal mood, affect , and behavior.    Assessment & Plan [name]; 06/07/2014 11:41 AM)  HIATAL HERNIA (553.3  K44.9) Impression: Recurrent hiatal hernia-has symptoms of epigastric bloating and dyspnea which may be related.  Plan: Wait for results of Pulmonary consult/work up. If pulmonary symptoms are felt to be due to the hernia she would like to proceed with repair-Laparoscopic possible open repair of recurrent hiatal hernia with or without mesh, Nissen fundoplication, possible gastrostomy tube. I have explained the procedure, risks, and aftercare. Risks include but are not limited to bleeding, infection, wound problems, anesthesia, dysphagia, injury to  esophagus/stomach/liver/spleen/intestine,gas bloat, diarrhea, failure to resolve symptoms, recurrence. We discussed the fact that the risks are higher in redo operation.  Strict dietary restriction were explained as well as changes in lifestyle.  Current Plans Follow up as needed Instructions: Eat 6 small meals per day. Avoid lifting over 20 pounds. Call us when your evaluation by the Pulmonary doctor is complete.  [name], MD

## 2014-06-09 ENCOUNTER — Ambulatory Visit (INDEPENDENT_AMBULATORY_CARE_PROVIDER_SITE_OTHER): Payer: Medicare Other | Admitting: Pulmonary Disease

## 2014-06-09 ENCOUNTER — Encounter: Payer: Self-pay | Admitting: Pulmonary Disease

## 2014-06-09 VITALS — BP 138/84 | HR 70 | Ht 62.0 in | Wt 167.0 lb

## 2014-06-09 DIAGNOSIS — R0609 Other forms of dyspnea: Secondary | ICD-10-CM | POA: Diagnosis not present

## 2014-06-09 DIAGNOSIS — R072 Precordial pain: Secondary | ICD-10-CM | POA: Diagnosis not present

## 2014-06-09 DIAGNOSIS — R0789 Other chest pain: Secondary | ICD-10-CM

## 2014-06-09 DIAGNOSIS — R0602 Shortness of breath: Secondary | ICD-10-CM | POA: Diagnosis not present

## 2014-06-09 NOTE — Assessment & Plan Note (Signed)
She has a history of coronary artery disease but was recently hospitalized for mid substernal chest pain. During that time she underwent a nuclear medicine test which showed no evidence of her personal ischemia and the LVEF was normal. Again, it sounds as if the midsternal chest pressure is related to her hiatal hernia.

## 2014-06-09 NOTE — Patient Instructions (Signed)
I recommend that you proceed with surgery for your hiatal hernia. Follow up with [place] as needed

## 2014-06-09 NOTE — Progress Notes (Signed)
Subjective:    Patient ID: [name] [name], female    DOB: 03-28-1935, 80 y.o.   MRN: [id number]  HPI Chief Complaint  Patient presents with  . Advice Only    Referred by Dr. Hilarie Fredrickson for SOB.  Pt states this is from a hiatal hernia which requires surgical clearance from pulmonary.     This is a very pleasant 80 year old female who smoked one pack of cigarettes daily for approximately 23 years and quit in 1990 comes to my clinic today for evaluation of shortness of breath. She states that in the last several months she has had a somewhat abrupt onset of the sensation of fullness in her chest as well as shortness of breath with any activity. She says that she can walk through grocery store without difficulty and she can carry groceries but activities like running the vacuum cleaner make her cough short of breath and she has to stop and catch her breath. She does not have a cough. She denies frank chest pain but she does have a substernal chest pressure. She was hospitalized for this chest pressure back in February and she had a nuclear stress test which was felt to be low risk. During that time her hemoglobin was checked and was found to be normal.  She tells me that she does not wheeze and she has never been told in the past that she has a lung problem.  In 2014 she had to have a stent placed for coronary artery disease. At that time showed a right heart catheterization which is detailed below.   Past Medical History  Diagnosis Date  . Depressive disorder, not elsewhere classified   . Anxiety state, unspecified   . Pure hypercholesterolemia   . Esophageal reflux   . Infection of esophagostomy   . Congestive heart failure, unspecified     diastolic heart failure  . Pulmonary hypertension     PA systolic pressure 33-35 mmHg by echocardiogram, PA pressure 33/10 by cardiac catheterization PA saturation 62% thermodilution cardiac index 2.0 thick cardiac index 2.4  . Bradycardia     previously  with coreg  . Tricuspid regurgitation     moderate tricuspid regurgitation by echocardiogram, prior use of anorexic agents  . Melanoma 1995    removal on back   . Coronary artery disease     a. Moderate to severe coronary disease involving the left anterior descending artery and right coronary artery.  Sequential stenosis in the LAD is significant.  Right coronary artery is moderate to severely likely nonischemic. Questionable small LVOT obstruction.  No Brockenbrough maneuver was performed.  Catheterization January 2013; b. 05/2014 MV no isch/infarct, EF 60%.  Marland Kitchen PONV (postoperative nausea and vomiting)   . Hypertension   . Dysrhythmia   . H/O hiatal hernia   . Gastritis   . [name]'s esophagus   . IBS (irritable bowel syndrome)   . Macular degeneration      Family History  Problem Relation Age of Onset  . Heart attack Father   . Hypertension Father   . Diabetes Mother   . Hypertension Mother   . Colon cancer Neg Hx   . Esophageal cancer Neg Hx   . Rectal cancer Neg Hx   . Stomach cancer Neg Hx   . Skin cancer Brother     melanoma     History   Social History  . Marital Status: Divorced    Spouse Name: N/A  . Number of Children: 3  .  Years of Education: N/A   Occupational History  . Retired    Social History Main Topics  . Smoking status: Former Smoker -- 1.00 packs/day for 20 years    Types: Cigarettes    Quit date: 04/26/1988  . Smokeless tobacco: Never Used     Comment: off and on smoker  . Alcohol Use: No  . Drug Use: No  . Sexual Activity: No   Other Topics Concern  . Not on file   Social History Narrative     Allergies  Allergen Reactions  . Sulfamethoxazole Rash     Outpatient Prescriptions Prior to Visit  Medication Sig Dispense Refill  . albuterol (PROAIR HFA) 108 (90 BASE) MCG/ACT inhaler Inhale 2 puffs into the lungs every 6 (six) hours as needed for shortness of breath.     Marland Kitchen alendronate (FOSAMAX) 70 MG tablet Take 70 mg by mouth every 7  (seven) days. Take with a full glass of water on an empty stomach on Mondays    . ALPRAZolam (XANAX) 0.25 MG tablet Take 0.25 mg by mouth 3 (three) times daily as needed for anxiety. Take 1/2 to 1 by mouth three times daily    . amLODipine (NORVASC) 10 MG tablet Take 10 mg by mouth daily.    Marland Kitchen aspirin 81 MG chewable tablet Chew 81 mg by mouth every evening.    . citalopram (CELEXA) 40 MG tablet Take 0.5 tablets (20 mg total) by mouth daily. (Patient taking differently: Take 20 mg by mouth at bedtime. )    . esomeprazole (NEXIUM) 40 MG capsule Take 20 mg by mouth daily before breakfast.     . ferrous sulfate 325 (65 FE) MG EC tablet Take 325 mg by mouth daily with breakfast.      . loratadine (CLARITIN) 10 MG tablet Take 10 mg by mouth daily.    Marland Kitchen losartan (COZAAR) 100 MG tablet Take 1 tablet (100 mg total) by mouth daily. 30 tablet 6  . Multiple Vitamins-Minerals (PRESERVISION AREDS) TABS Take 2 tablets by mouth daily.    Marland Kitchen Propylene Glycol (SYSTANE BALANCE OP) Place 1-2 drops into both eyes daily as needed (dry eyes).    . rosuvastatin (CRESTOR) 10 MG tablet Take 10 mg by mouth at bedtime.     . traZODone (DESYREL) 100 MG tablet Take 100 mg by mouth at bedtime.     . Vitamin D, Ergocalciferol, (DRISDOL) 50000 UNITS CAPS Take 50,000 Units by mouth every 7 (seven) days. Patient takes on Tuesdays    . zolpidem (AMBIEN) 10 MG tablet Take 10 mg by mouth at bedtime.      . metoCLOPramide (REGLAN) 5 MG tablet Take 1 tablet by mouth 2-4 times daily 120 tablet 1  . levofloxacin (LEVAQUIN) 500 MG tablet Take 500 mg by mouth daily. Started 05/03/14, for 10 days ending 05/13/14     No facility-administered medications prior to visit.       Review of Systems  Constitutional: Negative for fever and unexpected weight change.  HENT: Positive for postnasal drip. Negative for congestion, dental problem, ear pain, nosebleeds, rhinorrhea, sinus pressure, sneezing, sore throat and trouble swallowing.   Eyes:  Negative for redness and itching.  Respiratory: Positive for shortness of breath. Negative for cough, chest tightness and wheezing.   Cardiovascular: Negative for palpitations and leg swelling.  Gastrointestinal: Negative for nausea and vomiting.  Genitourinary: Negative for dysuria.  Musculoskeletal: Negative for joint swelling.  Skin: Negative for rash.  Neurological: Negative for headaches.  Hematological:  Does not bruise/bleed easily.  Psychiatric/Behavioral: Negative for dysphoric mood. The patient is not nervous/anxious.        Objective:   Physical Exam  Filed Vitals:   06/09/14 1640  BP: 138/84  Pulse: 70  Height: 5\' 2"  (1.575 m)  Weight: 167 lb (75.751 kg)  SpO2: 94%  RA  Ambulated 500 feet on RA and O2 saturation stayed above 97%  Gen: well appearing, no acute distress HEENT: NCAT, PERRL, EOMi, OP clear, neck supple without masses PULM: Barrell chest, CTA B CV: RRR, no mgr, no JVD AB: BS+, soft, nontender, no hsm Ext: warm, no edema, no clubbing, no cyanosis Derm: no rash or skin breakdown Neuro: A&Ox4, CN II-XII intact, strength 5/5 in all 4 extremities  06/09/2014 office spirometry> ratio 78%, FEV1 1.69 L (92% predicted), FVC 2.17 L (88% predicted)  2014 RHC: RA: 11/8/7 RV: 35/11 PCWP: 14/11/10 PA: 33/10/20  Cardiac Output  Thermodilution: 3.7 Index 2.0 Fick : 4.3 lpm, Index of 2.4  Arterial Sat: 94 % PA Sat: 62%.    Assessment & Plan:   Shortness of breath I have been asked to evaluate [name] to see if there is evidence of underlying pulmonary disease contribute to her shortness of breath. I have reviewed her records in detail. On physical exam today she has clear lungs, her ambulatory oximetry was normal (97% on room air) and her simple spirometry test was also completely normal. There is no evidence of airflow obstruction and the FVC was normal. A recent CBC did not show anemia.  She has a past medical history  significant for pulmonary hypertension which is interesting because when you look at her right heart catheterization she actually does not have pulmonary hypertension. In fact her numbers at that time showed a mean pulmonary arterial pressure of only 20. This may be at the upper limit of normal but it is certainly not diagnostic of pulmonary hypertension and it would not be explained her symptoms.  So in summary, at this point I do not see clear evidence of underlying lung disease and I feel that her shortness of breath and intermittent chest tightness is likely related to her underlying hiatal hernia.  Plan: -Would proceed with hiatal hernia repair   Midsternal chest pain She has a history of coronary artery disease but was recently hospitalized for mid substernal chest pain. During that time she underwent a nuclear medicine test which showed no evidence of her personal ischemia and the LVEF was normal. Again, it sounds as if the midsternal chest pressure is related to her hiatal hernia.    Updated Medication List Outpatient Encounter Prescriptions as of 06/09/2014  Medication Sig  . albuterol (PROAIR HFA) 108 (90 BASE) MCG/ACT inhaler Inhale 2 puffs into the lungs every 6 (six) hours as needed for shortness of breath.   Marland Kitchen alendronate (FOSAMAX) 70 MG tablet Take 70 mg by mouth every 7 (seven) days. Take with a full glass of water on an empty stomach on Mondays  . ALPRAZolam (XANAX) 0.25 MG tablet Take 0.25 mg by mouth 3 (three) times daily as needed for anxiety. Take 1/2 to 1 by mouth three times daily  . amLODipine (NORVASC) 10 MG tablet Take 10 mg by mouth daily.  Marland Kitchen aspirin 81 MG chewable tablet Chew 81 mg by mouth every evening.  . citalopram (CELEXA) 40 MG tablet Take 0.5 tablets (20 mg total) by mouth daily. (Patient taking differently: Take 20 mg by mouth at bedtime. )  . esomeprazole ([place])  40 MG capsule Take 20 mg by mouth daily before breakfast.   . ferrous sulfate 325 (65 FE) MG EC  tablet Take 325 mg by mouth daily with breakfast.    . loratadine (CLARITIN) 10 MG tablet Take 10 mg by mouth daily.  Marland Kitchen losartan (COZAAR) 100 MG tablet Take 1 tablet (100 mg total) by mouth daily.  . Multiple Vitamins-Minerals (PRESERVISION AREDS) TABS Take 2 tablets by mouth daily.  Marland Kitchen Propylene Glycol (SYSTANE BALANCE OP) Place 1-2 drops into both eyes daily as needed (dry eyes).  . rosuvastatin (CRESTOR) 10 MG tablet Take 10 mg by mouth at bedtime.   . traZODone (DESYREL) 100 MG tablet Take 100 mg by mouth at bedtime.   . Vitamin D, Ergocalciferol, (DRISDOL) 50000 UNITS CAPS Take 50,000 Units by mouth every 7 (seven) days. Patient takes on Tuesdays  . zolpidem (AMBIEN) 10 MG tablet Take 10 mg by mouth at bedtime.    . [DISCONTINUED] metoCLOPramide (REGLAN) 5 MG tablet Take 1 tablet by mouth 2-4 times daily  . [DISCONTINUED] levofloxacin (LEVAQUIN) 500 MG tablet Take 500 mg by mouth daily. Started 05/03/14, for 10 days ending 05/13/14

## 2014-06-09 NOTE — Assessment & Plan Note (Addendum)
I have been asked to evaluate [name] to see if there is evidence of underlying pulmonary disease contribute to her shortness of breath. I have reviewed her records in detail. On physical exam today she has clear lungs, her ambulatory oximetry was normal (97% on room air) and her simple spirometry test was also completely normal. There is no evidence of airflow obstruction and the FVC was normal. A recent CBC did not show anemia.  She has a past medical history significant for pulmonary hypertension which is interesting because when you look at her right heart catheterization she actually does not have pulmonary hypertension. In fact her numbers at that time showed a mean pulmonary arterial pressure of only 20. This may be at the upper limit of normal but it is certainly not diagnostic of pulmonary hypertension and it would not be explained her symptoms.  So in summary, at this point I do not see clear evidence of underlying lung disease and I feel that her shortness of breath and intermittent chest tightness is likely related to her underlying hiatal hernia.  Plan: -Would proceed with hiatal hernia repair

## 2014-06-15 ENCOUNTER — Encounter: Payer: Self-pay | Admitting: General Surgery

## 2014-06-15 NOTE — Progress Notes (Signed)
Patient ID: [name], female   DOB: 01-25-1935, 80 y.o.   MRN: [id number] Dr. Anastasia Pall opinion noted.  I spoke with her and she would like to proceed with surgery.  We will work on scheduling this.

## 2014-07-08 ENCOUNTER — Telehealth: Payer: Self-pay | Admitting: Internal Medicine

## 2014-07-08 NOTE — Telephone Encounter (Signed)
Pt called and is upset because surgery with Rosenbower was scheduled for 5/27 and that is the one date that she cannot do. States she was supposed to be getting a call back to see if it could be scheduled in April. Pt states she has called the office and has not heard anything back. Pt is quite frustrated and wanted to let Dr. Hilarie Fredrickson know and see if he thinks she should try to see another surgeon. Call placed to [place] office to see if any update on moving surgery, message left on voicemail. Dr. Hilarie Fredrickson notified.

## 2014-07-08 NOTE — Telephone Encounter (Signed)
Sorry to hear her frustration, but I do not have any control over surgical scheduling. I have great confidence in Dr. Zella Richer, but if she wishes for another opinion then referral would be [facility], [place] or [facility]

## 2014-07-08 NOTE — Telephone Encounter (Signed)
Pt is now currently scheduled for June 27th with Dr. Zella Richer. She had several dates offered earlier but the patient had conflicts with those dates.

## 2014-07-12 ENCOUNTER — Ambulatory Visit: Payer: 59 | Admitting: Cardiovascular Disease

## 2014-07-27 ENCOUNTER — Ambulatory Visit (INDEPENDENT_AMBULATORY_CARE_PROVIDER_SITE_OTHER): Payer: Medicare Other | Admitting: Cardiovascular Disease

## 2014-07-27 ENCOUNTER — Encounter: Payer: Self-pay | Admitting: Cardiovascular Disease

## 2014-07-27 VITALS — BP 157/76 | HR 62 | Ht 62.0 in | Wt 171.0 lb

## 2014-07-27 DIAGNOSIS — I2510 Atherosclerotic heart disease of native coronary artery without angina pectoris: Secondary | ICD-10-CM | POA: Diagnosis not present

## 2014-07-27 DIAGNOSIS — Z9289 Personal history of other medical treatment: Secondary | ICD-10-CM

## 2014-07-27 DIAGNOSIS — K449 Diaphragmatic hernia without obstruction or gangrene: Secondary | ICD-10-CM

## 2014-07-27 DIAGNOSIS — R001 Bradycardia, unspecified: Secondary | ICD-10-CM | POA: Diagnosis not present

## 2014-07-27 DIAGNOSIS — I480 Paroxysmal atrial fibrillation: Secondary | ICD-10-CM

## 2014-07-27 DIAGNOSIS — I10 Essential (primary) hypertension: Secondary | ICD-10-CM

## 2014-07-27 DIAGNOSIS — Z9889 Other specified postprocedural states: Secondary | ICD-10-CM

## 2014-07-27 DIAGNOSIS — Z87898 Personal history of other specified conditions: Secondary | ICD-10-CM

## 2014-07-27 DIAGNOSIS — Z9861 Coronary angioplasty status: Secondary | ICD-10-CM

## 2014-07-27 NOTE — Patient Instructions (Signed)

## 2014-07-27 NOTE — Progress Notes (Signed)
Patient ID: [name], female   DOB: [birth year], 79 y.o.   MRN: [id number]      SUBJECTIVE: The patient presents for routine cardiovascular follow-up. She was hospitalized for chest pain in February 2016 and underwent a normal nuclear stress test, LVEF 60%. She saw a pulmonologist earlier this year who determined she did not have any pulmonary hypertension or significant pulmonary disease whatsoever. Her symptoms of both chest pain and shortness of breath were treated to her hiatal hernia. Her daughter apparently indicated that the patient has a history of abusing prescription medications.  She used to stay very active participating in multiple classes at the [facility] including yoga, line dancing, and pickle ball, but has been unable to do so due to symptoms from her hiatal hernia. She is scheduled for hiatal hernia surgery at [facility] on 09/26/14. She has not had much of an appetite.  Review of Systems: As per "subjective", otherwise negative.  Allergies  Allergen Reactions  . Sulfamethoxazole Rash    Current Outpatient Prescriptions  Medication Sig Dispense Refill  . albuterol (PROAIR HFA) 108 (90 BASE) MCG/ACT inhaler Inhale 2 puffs into the lungs every 6 (six) hours as needed for shortness of breath.     Marland Kitchen alendronate (FOSAMAX) 70 MG tablet Take 70 mg by mouth every 7 (seven) days. Take with a full glass of water on an empty stomach on Mondays    . ALPRAZolam (XANAX) 0.25 MG tablet Take 0.25 mg by mouth 3 (three) times daily as needed for anxiety. Take 1/2 to 1 by mouth three times daily    . amLODipine (NORVASC) 10 MG tablet Take 10 mg by mouth daily.    Marland Kitchen aspirin 81 MG chewable tablet Chew 81 mg by mouth every evening.    . citalopram (CELEXA) 40 MG tablet Take 0.5 tablets (20 mg total) by mouth daily. (Patient taking differently: Take 20 mg by mouth at bedtime. )    . esomeprazole (NEXIUM) 40 MG capsule Take 20 mg by mouth daily before breakfast.     . ferrous sulfate 325  (65 FE) MG EC tablet Take 325 mg by mouth daily with breakfast.      . loratadine (CLARITIN) 10 MG tablet Take 10 mg by mouth daily.    Marland Kitchen losartan (COZAAR) 100 MG tablet Take 1 tablet (100 mg total) by mouth daily. 30 tablet 6  . Multiple Vitamins-Minerals (PRESERVISION AREDS) TABS Take 2 tablets by mouth daily.    Marland Kitchen Propylene Glycol (SYSTANE BALANCE OP) Place 1-2 drops into both eyes daily as needed (dry eyes).    . rosuvastatin (CRESTOR) 10 MG tablet Take 10 mg by mouth at bedtime.     . traZODone (DESYREL) 100 MG tablet Take 100 mg by mouth at bedtime.     . Vitamin D, Ergocalciferol, (DRISDOL) 50000 UNITS CAPS Take 50,000 Units by mouth every 7 (seven) days. Patient takes on Tuesdays    . zolpidem (AMBIEN) 10 MG tablet Take 10 mg by mouth at bedtime.       No current facility-administered medications for this visit.    Past Medical History  Diagnosis Date  . Depressive disorder, not elsewhere classified   . Anxiety state, unspecified   . Pure hypercholesterolemia   . Esophageal reflux   . Infection of esophagostomy   . Congestive heart failure, unspecified     diastolic heart failure  . Pulmonary hypertension     PA systolic pressure 50-93 mmHg by echocardiogram, PA pressure 33/10 by  cardiac catheterization PA saturation 62% thermodilution cardiac index 2.0 thick cardiac index 2.4  . Bradycardia     previously with coreg  . Tricuspid regurgitation     moderate tricuspid regurgitation by echocardiogram, prior use of anorexic agents  . Melanoma 1995    removal on back   . Coronary artery disease     a. Moderate to severe coronary disease involving the left anterior descending artery and right coronary artery.  Sequential stenosis in the LAD is significant.  Right coronary artery is moderate to severely likely nonischemic. Questionable small LVOT obstruction.  No Brockenbrough maneuver was performed.  Catheterization January 2013; b. 05/2014 MV no isch/infarct, EF 60%.  Marland Kitchen PONV  (postoperative nausea and vomiting)   . Hypertension   . Dysrhythmia   . H/O hiatal hernia   . Gastritis   . [name]'s esophagus   . IBS (irritable bowel syndrome)   . Macular degeneration     Past Surgical History  Procedure Laterality Date  . Nissen fundoplication  9323  . Rotator cuff repair  ~ 2001    right  . Coronary angioplasty with stent placement  05/08/11    "1"  . Tonsillectomy  ~ 1944  . Shoulder arthroscopy  ~ 2004; 2005    left; "joint's wore out"  . Cataract extraction w/ intraocular lens  implant, bilateral  ~ 2002  . Tubal ligation  1972  . Percutaneous coronary stent intervention (pci-s) N/A 05/08/2011    Procedure: PERCUTANEOUS CORONARY STENT INTERVENTION (PCI-S);  Surgeon: Wellington Hampshire, MD;  Location: [facility] CATH LAB;  Service: Cardiovascular;  Laterality: N/A;  . Esophageal manometry N/A 06/06/2014    Procedure: ESOPHAGEAL MANOMETRY (EM);  Surgeon: Jerene Bears, MD;  Location: [facility] ENDOSCOPY;  Service: Gastroenterology;  Laterality: N/A;    History   Social History  . Marital Status: Divorced    Spouse Name: N/A  . Number of Children: 3  . Years of Education: N/A   Occupational History  . Retired    Social History Main Topics  . Smoking status: Former Smoker -- 1.00 packs/day for 20 years    Types: Cigarettes    Start date: 10/12/1958    Quit date: 04/26/1988  . Smokeless tobacco: Never Used     Comment: off and on smoker  . Alcohol Use: No  . Drug Use: No  . Sexual Activity: No   Other Topics Concern  . Not on file   Social History Narrative     Filed Vitals:   07/27/14 1053  BP: 157/76  Pulse: 62  Height: 5\' 2"  (1.575 m)  Weight: 171 lb (77.565 kg)    PHYSICAL EXAM General: NAD HEENT: Normal. Neck: No JVD, no thyromegaly. Lungs: Clear to auscultation bilaterally with normal respiratory effort. CV: Nondisplaced PMI.  Regular rate and rhythm, normal S1/S2, no S3/S4, no murmur. No pretibial or periankle edema.  No carotid bruit.   Normal pedal pulses.  Abdomen: Soft, mild diffuse tenderness, no hepatosplenomegaly, no distention.  Neurologic: Alert and oriented x 3.  Psych: Normal affect. Skin: Normal. Musculoskeletal: Normal range of motion, no gross deformities. Extremities: No clubbing or cyanosis.   ECG: Most recent ECG reviewed.      ASSESSMENT AND PLAN: 1. CAD s/p LAD stent: Stable ischemic heart disease. Continue ASA and statin. No beta blockers due to symptomatic bradycardia.  2. Essential HTN: Elevated. Will monitor for now until after surgery for hiatal hernia. On amlodipine 10 mg and losartan 100 mg. 3. Symptomatic bradycardia/Paroxysmal  atrial fibrillation: Event monitor in 2014 did not reveal any tachy-brady arrhythmias.  4. Chronic diastolic dysfunction: Symptomatically stable, with no recent use of Lasix. 5. Paroxysmal atrial fibrillation: If she indeed has recurrences and we can document this, will need to consider anticoagulation.  6. Hiatal hernia: Surgery planned for 09/26/14.  Dispo: f/u 6 months.  Time spent: 40 minutes, of which greater than 50% was spent reviewing symptoms, relevant blood tests and studies, and discussing management plan with the patient.   Kate Sable, M.D., F.A.C.C.

## 2014-09-19 ENCOUNTER — Encounter (HOSPITAL_COMMUNITY): Payer: Self-pay | Admitting: *Deleted

## 2014-09-20 NOTE — Progress Notes (Signed)
Lov/cardiac clearance note dr Bronson Ing cardiology 07-27-14 epic Pulmonary clearance note dr [place] Bells 06-09-14 epic Stress test 05-08-14 epic Chest xray 05-07-14 epic Echo 09-07-12 epic ekg 05-07-14 epic

## 2014-09-20 NOTE — Progress Notes (Signed)
Need orders for 09-26-14 surgery in epic, pre op is 09-21-14

## 2014-09-20 NOTE — Patient Instructions (Addendum)
[name]  09/20/2014   Your procedure is scheduled on: Monday 09-26-2014  Report to [name]  Entrance take [name]  elevators to 3rd floor   to  [name] at 530 AM.  Call this number if you have problems the morning of surgery [phone]   Remember: ONLY 1 PERSON MAY GO WITH YOU TO SHORT STAY TO GET  READY MORNING OF [name].  Do not eat food or drink liquids :After Midnight.     Take these medicines the morning of surgery with A SIP OF WATER:Albuterol Inhaler if needed and bring with you and leave with your family, Xanax if needed, Norvasc, Nexium, Claritin, Systane eye drop                                You may not have any metal on your body including hair pins and              piercings  Do not wear jewelry, make-up, lotions, powders or perfumes, deodorant             Do not wear nail polish.  Do not shave  48 hours prior to surgery.              Men may shave face and neck.   Do not bring valuables to the [facility]. [name].  Contacts, dentures or bridgework may not be worn into surgery.  Leave suitcase in the car. After surgery it may be brought to your room.     Patients discharged the day of surgery will not be allowed to drive home.  Name and phone number of your driver:  Special Instructions: N/A              Please read over the following fact sheets you were given: _____________________________________________________________________             [name] - Preparing for Surgery Before surgery, you can play an important role.  Because skin is not sterile, your skin needs to be as free of germs as possible.  You can reduce the number of germs on your skin by washing with CHG (chlorahexidine gluconate) soap before surgery.  CHG is an antiseptic cleaner which kills germs and bonds with the skin to continue killing germs even after washing. Please DO NOT use if you have an  allergy to CHG or antibacterial soaps.  If your skin becomes reddened/irritated stop using the CHG and inform your nurse when you arrive at Short Stay. Do not shave (including legs and underarms) for at least 48 hours prior to the first CHG shower.  You may shave your face/neck. Please follow these instructions carefully:  1.  Shower with CHG Soap the night before surgery and the  morning of Surgery.  2.  If you choose to wash your hair, wash your hair first as usual with your  normal  shampoo.  3.  After you shampoo, rinse your hair and body thoroughly to remove the  shampoo.                           4.  Use CHG as you would any other liquid soap.  You  can apply chg directly  to the skin and wash                       Gently with a scrungie or clean washcloth.  5.  Apply the CHG Soap to your body ONLY FROM THE NECK DOWN.   Do not use on face/ open                           Wound or open sores. Avoid contact with eyes, ears mouth and genitals (private parts).                       Wash face,  Genitals (private parts) with your normal soap.             6.  Wash thoroughly, paying special attention to the area where your surgery  will be performed.  7.  Thoroughly rinse your body with warm water from the neck down.  8.  DO NOT shower/wash with your normal soap after using and rinsing off  the CHG Soap.                9.  Pat yourself dry with a clean towel.            10.  Wear clean pajamas.            11.  Place clean sheets on your bed the night of your first shower and do not  sleep with pets. Day of Surgery : Do not apply any lotions/deodorants the morning of surgery.  Please wear clean clothes to the [facility]/surgery center.  FAILURE TO FOLLOW THESE INSTRUCTIONS MAY RESULT IN THE CANCELLATION OF YOUR SURGERY PATIENT SIGNATURE_________________________________  NURSE SIGNATURE__________________________________  ________________________________________________________________________

## 2014-09-21 ENCOUNTER — Encounter (HOSPITAL_COMMUNITY)
Admission: RE | Admit: 2014-09-21 | Discharge: 2014-09-21 | Disposition: A | Discharge status: Home / Self Care | Payer: Medicare Other | Source: Ambulatory Visit | Attending: General Surgery | Admitting: General Surgery

## 2014-09-21 ENCOUNTER — Encounter (HOSPITAL_COMMUNITY): Payer: Self-pay

## 2014-09-21 DIAGNOSIS — K449 Diaphragmatic hernia without obstruction or gangrene: Secondary | ICD-10-CM | POA: Insufficient documentation

## 2014-09-21 DIAGNOSIS — Z01812 Encounter for preprocedural laboratory examination: Secondary | ICD-10-CM | POA: Diagnosis present

## 2014-09-21 LAB — CBC
HCT: 41.1 % (ref 36.0–46.0)
Hemoglobin: 13 g/dL (ref 12.0–15.0)
MCH: 26.8 pg (ref 26.0–34.0)
MCHC: 31.6 g/dL (ref 30.0–36.0)
MCV: 84.7 fL (ref 78.0–100.0)
Platelets: 191 K/uL (ref 150–400)
RBC: 4.85 MIL/uL (ref 3.87–5.11)
RDW: 14.1 % (ref 11.5–15.5)
WBC: 6.4 K/uL (ref 4.0–10.5)

## 2014-09-21 LAB — BASIC METABOLIC PANEL
ANION GAP: 10 (ref 5–15)
BUN: 27 mg/dL — ABNORMAL HIGH (ref 6–20)
CO2: 28 mmol/L (ref 22–32)
Calcium: 9.7 mg/dL (ref 8.9–10.3)
Chloride: 102 mmol/L (ref 101–111)
Creatinine, Ser: 1.12 mg/dL — ABNORMAL HIGH (ref 0.44–1.00)
GFR calc non Af Amer: 46 mL/min — ABNORMAL LOW (ref >60)
GFR, EST AFRICAN AMERICAN: 53 mL/min — AB (ref >60)
Glucose, Bld: 109 mg/dL — ABNORMAL HIGH (ref 65–99)
POTASSIUM: 4.2 mmol/L (ref 3.5–5.1)
Sodium: 140 mmol/L (ref 135–145)

## 2014-09-21 NOTE — Progress Notes (Signed)
bmet results sent by epic to dr Zella Richer Suanne Marker

## 2014-09-23 ENCOUNTER — Other Ambulatory Visit: Payer: Self-pay | Admitting: General Surgery

## 2014-09-23 NOTE — Progress Notes (Signed)
Spoke with Sunday Spillers at Dr. Bertrum Sol office. She will have him put orders in

## 2014-09-25 NOTE — Anesthesia Preprocedure Evaluation (Addendum)
Anesthesia Evaluation  Patient identified by MRN, date of birth, ID band Patient awake    Reviewed: Allergy & Precautions, NPO status , Patient's Chart, lab work & pertinent test results  History of Anesthesia Complications (+) PONV and history of anesthetic complications  Airway Mallampati: II  TM Distance: >3 FB Neck ROM: Full    Dental  (+) Edentulous Lower, Missing, Dental Advisory Given   Pulmonary former smoker,    Pulmonary exam normal       Cardiovascular hypertension, Pt. on medications + CAD, + Cardiac Stents, +CHF and + DOE Normal cardiovascular exam+ dysrhythmias Atrial Fibrillation + Valvular Problems/Murmurs  Echo 08/2012 Study Conclusions - Left ventricle: The cavity size was normal. Wall thicknesswas normal. Systolic function was normal. The estimated ejection fraction was in the range of 55% to 60%. - Aortic valve: Mildly calcified annulus. Trileaflet; mildly calcified leaflets. - Mitral valve: Calcified annulus. - Left atrium: The atrium was mildly to moderately dilated. - Atrial septum: No defect or patent foramen ovale was identified. - Pulmonary arteries: PA peak pressure: 67mm Hg (S)  Lexiscan Cardiolite 2.7.2016 IMPRESSION: 1. No reversible ischemia or infarction. 2. Normal left ventricular wall motion. 3. Left ventricular ejection fraction 60% 4. Low-risk stress test findings*.     Neuro/Psych    GI/Hepatic Neg liver ROS, hiatal hernia, GERD-  ,  Endo/Other    Renal/GU      Musculoskeletal   Abdominal   Peds  Hematology   Anesthesia Other Findings   Reproductive/Obstetrics                       Anesthesia Physical Anesthesia Plan  ASA: III  Anesthesia Plan: General   Post-op Pain Management:    Induction: Intravenous  Airway Management Planned: Oral ETT  Additional Equipment:   Intra-op Plan:   Post-operative Plan: Possible Post-op  intubation/ventilation  Informed Consent: I have reviewed the patients History and Physical, chart, labs and discussed the procedure including the risks, benefits and alternatives for the proposed anesthesia with the patient or authorized representative who has indicated his/her understanding and acceptance.   Dental advisory given  Plan Discussed with: CRNA, Anesthesiologist and Surgeon  Anesthesia Plan Comments: (Consider a-line placement)       Anesthesia Quick Evaluation

## 2014-09-26 ENCOUNTER — Inpatient Hospital Stay (HOSPITAL_COMMUNITY)
Admission: RE | Admit: 2014-09-26 | Discharge: 2014-09-28 | DRG: 327 | Disposition: A | Discharge status: Home / Self Care | Payer: Medicare Other | Source: Ambulatory Visit | Attending: General Surgery | Admitting: General Surgery

## 2014-09-26 ENCOUNTER — Inpatient Hospital Stay (HOSPITAL_COMMUNITY): Payer: Medicare Other | Admitting: Anesthesiology

## 2014-09-26 ENCOUNTER — Encounter (HOSPITAL_COMMUNITY)
Admission: RE | Disposition: A | Discharge status: Home / Self Care | Payer: Self-pay | Source: Ambulatory Visit | Attending: General Surgery

## 2014-09-26 ENCOUNTER — Encounter (HOSPITAL_COMMUNITY): Payer: Self-pay | Admitting: *Deleted

## 2014-09-26 DIAGNOSIS — Z8249 Family history of ischemic heart disease and other diseases of the circulatory system: Secondary | ICD-10-CM

## 2014-09-26 DIAGNOSIS — K449 Diaphragmatic hernia without obstruction or gangrene: Secondary | ICD-10-CM | POA: Diagnosis present

## 2014-09-26 DIAGNOSIS — H3530 Unspecified macular degeneration: Secondary | ICD-10-CM | POA: Diagnosis present

## 2014-09-26 DIAGNOSIS — Z9842 Cataract extraction status, left eye: Secondary | ICD-10-CM

## 2014-09-26 DIAGNOSIS — F329 Major depressive disorder, single episode, unspecified: Secondary | ICD-10-CM | POA: Diagnosis present

## 2014-09-26 DIAGNOSIS — Z85820 Personal history of malignant melanoma of skin: Secondary | ICD-10-CM | POA: Diagnosis not present

## 2014-09-26 DIAGNOSIS — Z882 Allergy status to sulfonamides status: Secondary | ICD-10-CM

## 2014-09-26 DIAGNOSIS — F411 Generalized anxiety disorder: Secondary | ICD-10-CM | POA: Diagnosis present

## 2014-09-26 DIAGNOSIS — Z961 Presence of intraocular lens: Secondary | ICD-10-CM | POA: Diagnosis present

## 2014-09-26 DIAGNOSIS — K2970 Gastritis, unspecified, without bleeding: Secondary | ICD-10-CM | POA: Diagnosis present

## 2014-09-26 DIAGNOSIS — I5032 Chronic diastolic (congestive) heart failure: Secondary | ICD-10-CM | POA: Diagnosis present

## 2014-09-26 DIAGNOSIS — K660 Peritoneal adhesions (postprocedural) (postinfection): Secondary | ICD-10-CM | POA: Diagnosis present

## 2014-09-26 DIAGNOSIS — E780 Pure hypercholesterolemia: Secondary | ICD-10-CM | POA: Diagnosis present

## 2014-09-26 DIAGNOSIS — Z791 Long term (current) use of non-steroidal anti-inflammatories (NSAID): Secondary | ICD-10-CM | POA: Diagnosis not present

## 2014-09-26 DIAGNOSIS — Z7982 Long term (current) use of aspirin: Secondary | ICD-10-CM | POA: Diagnosis not present

## 2014-09-26 DIAGNOSIS — Z79899 Other long term (current) drug therapy: Secondary | ICD-10-CM | POA: Diagnosis not present

## 2014-09-26 DIAGNOSIS — Z9841 Cataract extraction status, right eye: Secondary | ICD-10-CM | POA: Diagnosis not present

## 2014-09-26 DIAGNOSIS — K219 Gastro-esophageal reflux disease without esophagitis: Secondary | ICD-10-CM | POA: Diagnosis present

## 2014-09-26 DIAGNOSIS — I071 Rheumatic tricuspid insufficiency: Secondary | ICD-10-CM | POA: Diagnosis present

## 2014-09-26 DIAGNOSIS — I2510 Atherosclerotic heart disease of native coronary artery without angina pectoris: Secondary | ICD-10-CM | POA: Diagnosis present

## 2014-09-26 DIAGNOSIS — Z808 Family history of malignant neoplasm of other organs or systems: Secondary | ICD-10-CM

## 2014-09-26 DIAGNOSIS — Z833 Family history of diabetes mellitus: Secondary | ICD-10-CM | POA: Diagnosis not present

## 2014-09-26 DIAGNOSIS — Z01812 Encounter for preprocedural laboratory examination: Secondary | ICD-10-CM | POA: Diagnosis not present

## 2014-09-26 DIAGNOSIS — I10 Essential (primary) hypertension: Secondary | ICD-10-CM | POA: Diagnosis present

## 2014-09-26 DIAGNOSIS — Z87891 Personal history of nicotine dependence: Secondary | ICD-10-CM | POA: Diagnosis not present

## 2014-09-26 DIAGNOSIS — R609 Edema, unspecified: Secondary | ICD-10-CM | POA: Diagnosis present

## 2014-09-26 DIAGNOSIS — I272 Other secondary pulmonary hypertension: Secondary | ICD-10-CM | POA: Diagnosis present

## 2014-09-26 DIAGNOSIS — K589 Irritable bowel syndrome without diarrhea: Secondary | ICD-10-CM | POA: Diagnosis present

## 2014-09-26 DIAGNOSIS — K2270 Barrett's esophagus without dysplasia: Secondary | ICD-10-CM | POA: Diagnosis present

## 2014-09-26 HISTORY — PX: LAPAROSCOPIC NISSEN FUNDOPLICATION: SHX1932

## 2014-09-26 LAB — ABO/RH: ABO/RH(D): O POS

## 2014-09-26 LAB — HEMOGLOBIN AND HEMATOCRIT, BLOOD
HCT: 35.8 % — ABNORMAL LOW (ref 36.0–46.0)
Hemoglobin: 11.3 g/dL — ABNORMAL LOW (ref 12.0–15.0)

## 2014-09-26 LAB — TYPE AND SCREEN
ABO/RH(D): O POS
Antibody Screen: NEGATIVE

## 2014-09-26 LAB — PROTIME-INR
INR: 0.97 (ref 0.00–1.49)
Prothrombin Time: 13.1 seconds (ref 11.6–15.2)

## 2014-09-26 SURGERY — FUNDOPLICATION, NISSEN, LAPAROSCOPIC
Anesthesia: General

## 2014-09-26 MED ORDER — LIDOCAINE HCL (CARDIAC) 20 MG/ML IV SOLN
INTRAVENOUS | Status: DC | PRN
  Administered 2014-09-26: 50 mg via INTRAVENOUS

## 2014-09-26 MED ORDER — HYDROMORPHONE HCL 1 MG/ML IJ SOLN
0.5000 mg | INTRAMUSCULAR | Status: DC | PRN
  Administered 2014-09-26 – 2014-09-27 (×3): 0.5 mg via INTRAVENOUS
  Filled 2014-09-26 (×3): qty 1

## 2014-09-26 MED ORDER — GLYCOPYRROLATE 0.2 MG/ML IJ SOLN
INTRAMUSCULAR | Status: AC
  Filled 2014-09-26: qty 3

## 2014-09-26 MED ORDER — HYDRALAZINE HCL 20 MG/ML IJ SOLN
20.0000 mg | INTRAMUSCULAR | Status: DC | PRN
  Administered 2014-09-26: 20 mg via INTRAVENOUS
  Filled 2014-09-26: qty 1

## 2014-09-26 MED ORDER — FENTANYL CITRATE (PF) 250 MCG/5ML IJ SOLN
INTRAMUSCULAR | Status: AC
  Filled 2014-09-26: qty 5

## 2014-09-26 MED ORDER — MIDAZOLAM HCL 2 MG/2ML IJ SOLN
INTRAMUSCULAR | Status: AC
  Filled 2014-09-26: qty 2

## 2014-09-26 MED ORDER — DEXTROSE-NACL 5-0.9 % IV SOLN
INTRAVENOUS | Status: DC
  Administered 2014-09-26: 1000 mL via INTRAVENOUS
  Administered 2014-09-26 – 2014-09-27 (×2): via INTRAVENOUS
  Administered 2014-09-27: 100 mL/h via INTRAVENOUS

## 2014-09-26 MED ORDER — LACTATED RINGERS IV SOLN
INTRAVENOUS | Status: DC | PRN
  Administered 2014-09-26: 07:00:00 via INTRAVENOUS

## 2014-09-26 MED ORDER — CEFAZOLIN SODIUM 1-5 GM-% IV SOLN
1.0000 g | Freq: Three times a day (TID) | INTRAVENOUS | Status: AC
  Administered 2014-09-26 – 2014-09-27 (×3): 1 g via INTRAVENOUS
  Filled 2014-09-26 (×3): qty 50

## 2014-09-26 MED ORDER — CEFAZOLIN SODIUM-DEXTROSE 2-3 GM-% IV SOLR
INTRAVENOUS | Status: AC
  Filled 2014-09-26: qty 50

## 2014-09-26 MED ORDER — MORPHINE SULFATE 2 MG/ML IJ SOLN
2.0000 mg | INTRAMUSCULAR | Status: DC | PRN
  Administered 2014-09-26: 2 mg via INTRAVENOUS
  Filled 2014-09-26: qty 1

## 2014-09-26 MED ORDER — MIDAZOLAM HCL 5 MG/5ML IJ SOLN
INTRAMUSCULAR | Status: DC | PRN
  Administered 2014-09-26 (×2): 1 mg via INTRAVENOUS

## 2014-09-26 MED ORDER — PHENYLEPHRINE 40 MCG/ML (10ML) SYRINGE FOR IV PUSH (FOR BLOOD PRESSURE SUPPORT)
PREFILLED_SYRINGE | INTRAVENOUS | Status: AC
  Filled 2014-09-26: qty 10

## 2014-09-26 MED ORDER — DEXAMETHASONE SODIUM PHOSPHATE 10 MG/ML IJ SOLN
INTRAMUSCULAR | Status: AC
  Filled 2014-09-26: qty 1

## 2014-09-26 MED ORDER — PROPOFOL 10 MG/ML IV BOLUS
INTRAVENOUS | Status: DC | PRN
  Administered 2014-09-26: 120 mg via INTRAVENOUS

## 2014-09-26 MED ORDER — PHENYLEPHRINE HCL 10 MG/ML IJ SOLN
INTRAMUSCULAR | Status: DC | PRN
  Administered 2014-09-26: 40 ug via INTRAVENOUS

## 2014-09-26 MED ORDER — EPHEDRINE SULFATE 50 MG/ML IJ SOLN
INTRAMUSCULAR | Status: AC
  Filled 2014-09-26: qty 1

## 2014-09-26 MED ORDER — SCOPOLAMINE 1 MG/3DAYS TD PT72
1.0000 | MEDICATED_PATCH | TRANSDERMAL | Status: DC
  Administered 2014-09-26: 1.5 mg via TRANSDERMAL
  Filled 2014-09-26: qty 1

## 2014-09-26 MED ORDER — DEXAMETHASONE SODIUM PHOSPHATE 10 MG/ML IJ SOLN
INTRAMUSCULAR | Status: DC | PRN
  Administered 2014-09-26: 10 mg via INTRAVENOUS

## 2014-09-26 MED ORDER — EPHEDRINE SULFATE 50 MG/ML IJ SOLN
INTRAMUSCULAR | Status: DC | PRN
  Administered 2014-09-26: 5 mg via INTRAVENOUS
  Administered 2014-09-26: 10 mg via INTRAVENOUS

## 2014-09-26 MED ORDER — PROPOFOL 10 MG/ML IV BOLUS
INTRAVENOUS | Status: AC
  Filled 2014-09-26: qty 20

## 2014-09-26 MED ORDER — ONDANSETRON HCL 4 MG/2ML IJ SOLN
4.0000 mg | INTRAMUSCULAR | Status: DC | PRN
  Administered 2014-09-26 – 2014-09-27 (×3): 4 mg via INTRAVENOUS
  Filled 2014-09-26 (×3): qty 2

## 2014-09-26 MED ORDER — ALBUMIN HUMAN 25 % IV SOLN
INTRAVENOUS | Status: DC | PRN
  Administered 2014-09-26: 08:00:00 via INTRAVENOUS

## 2014-09-26 MED ORDER — NEOSTIGMINE METHYLSULFATE 10 MG/10ML IV SOLN
INTRAVENOUS | Status: AC
  Filled 2014-09-26: qty 1

## 2014-09-26 MED ORDER — HEPARIN SODIUM (PORCINE) 5000 UNIT/ML IJ SOLN
5000.0000 [IU] | Freq: Three times a day (TID) | INTRAMUSCULAR | Status: DC
  Administered 2014-09-27 – 2014-09-28 (×4): 5000 [IU] via SUBCUTANEOUS
  Filled 2014-09-26 (×7): qty 1

## 2014-09-26 MED ORDER — GLYCOPYRROLATE 0.2 MG/ML IJ SOLN
INTRAMUSCULAR | Status: DC | PRN
  Administered 2014-09-26: 0.6 mg via INTRAVENOUS

## 2014-09-26 MED ORDER — ALBUMIN HUMAN 5 % IV SOLN
INTRAVENOUS | Status: AC
  Filled 2014-09-26: qty 250

## 2014-09-26 MED ORDER — BUPIVACAINE HCL (PF) 0.5 % IJ SOLN
INTRAMUSCULAR | Status: AC
  Filled 2014-09-26: qty 30

## 2014-09-26 MED ORDER — LACTATED RINGERS IV SOLN: INTRAVENOUS | Status: DC

## 2014-09-26 MED ORDER — ROCURONIUM BROMIDE 100 MG/10ML IV SOLN
INTRAVENOUS | Status: AC
  Filled 2014-09-26: qty 1

## 2014-09-26 MED ORDER — BUPIVACAINE HCL (PF) 0.5 % IJ SOLN
INTRAMUSCULAR | Status: DC | PRN
  Administered 2014-09-26: 10 mL

## 2014-09-26 MED ORDER — LACTATED RINGERS IR SOLN
Status: DC | PRN
  Administered 2014-09-26: 1000 mL

## 2014-09-26 MED ORDER — ONDANSETRON HCL 4 MG/2ML IJ SOLN
INTRAMUSCULAR | Status: DC | PRN
  Administered 2014-09-26: 4 mg via INTRAVENOUS

## 2014-09-26 MED ORDER — LIDOCAINE HCL (CARDIAC) 20 MG/ML IV SOLN
INTRAVENOUS | Status: AC
  Filled 2014-09-26: qty 5

## 2014-09-26 MED ORDER — CEFAZOLIN SODIUM-DEXTROSE 2-3 GM-% IV SOLR
2.0000 g | INTRAVENOUS | Status: AC
  Administered 2014-09-26: 2 g via INTRAVENOUS

## 2014-09-26 MED ORDER — PROMETHAZINE HCL 25 MG/ML IJ SOLN
6.2500 mg | INTRAMUSCULAR | Status: DC | PRN
  Administered 2014-09-26 (×2): 6.25 mg via INTRAVENOUS
  Filled 2014-09-26 (×2): qty 1

## 2014-09-26 MED ORDER — NEOSTIGMINE METHYLSULFATE 10 MG/10ML IV SOLN
INTRAVENOUS | Status: DC | PRN
  Administered 2014-09-26: 4 mg via INTRAVENOUS

## 2014-09-26 MED ORDER — FENTANYL CITRATE (PF) 100 MCG/2ML IJ SOLN
INTRAMUSCULAR | Status: DC | PRN
  Administered 2014-09-26 (×3): 50 ug via INTRAVENOUS
  Administered 2014-09-26 (×2): 25 ug via INTRAVENOUS
  Administered 2014-09-26: 50 ug via INTRAVENOUS

## 2014-09-26 MED ORDER — ALBUTEROL SULFATE HFA 108 (90 BASE) MCG/ACT IN AERS: 2.0000 | INHALATION_SPRAY | Freq: Four times a day (QID) | RESPIRATORY_TRACT | Status: DC | PRN

## 2014-09-26 MED ORDER — ONDANSETRON HCL 4 MG/2ML IJ SOLN
INTRAMUSCULAR | Status: AC
Start: 2014-09-26 — End: 2014-09-26
  Filled 2014-09-26: qty 2

## 2014-09-26 MED ORDER — PANTOPRAZOLE SODIUM 40 MG IV SOLR
40.0000 mg | Freq: Two times a day (BID) | INTRAVENOUS | Status: DC
  Administered 2014-09-26 – 2014-09-28 (×4): 40 mg via INTRAVENOUS
  Filled 2014-09-26 (×5): qty 40

## 2014-09-26 MED ORDER — ROCURONIUM BROMIDE 100 MG/10ML IV SOLN
INTRAVENOUS | Status: DC | PRN
  Administered 2014-09-26: 40 mg via INTRAVENOUS
  Administered 2014-09-26 (×3): 10 mg via INTRAVENOUS

## 2014-09-26 MED ORDER — CHLORHEXIDINE GLUCONATE 0.12 % MT SOLN
15.0000 mL | Freq: Two times a day (BID) | OROMUCOSAL | Status: DC
  Administered 2014-09-26 – 2014-09-28 (×4): 15 mL via OROMUCOSAL
  Filled 2014-09-26 (×5): qty 15

## 2014-09-26 MED ORDER — SUCCINYLCHOLINE CHLORIDE 20 MG/ML IJ SOLN
INTRAMUSCULAR | Status: DC | PRN
  Administered 2014-09-26: 100 mg via INTRAVENOUS

## 2014-09-26 MED ORDER — ALBUTEROL SULFATE (2.5 MG/3ML) 0.083% IN NEBU: 2.5000 mg | INHALATION_SOLUTION | Freq: Four times a day (QID) | RESPIRATORY_TRACT | Status: DC | PRN

## 2014-09-26 SURGICAL SUPPLY — 58 items
APL SKNCLS STERI-STRIP NONHPOA (GAUZE/BANDAGES/DRESSINGS) ×1
APPLIER CLIP ROT 10 11.4 M/L (STAPLE)
APR CLP MED LRG 11.4X10 (STAPLE)
BENZOIN TINCTURE PRP APPL 2/3 (GAUZE/BANDAGES/DRESSINGS) ×3 IMPLANT
CLAMP ENDO BABCK 10MM (STAPLE) IMPLANT
CLIP APPLIE ROT 10 11.4 M/L (STAPLE) ×1 IMPLANT
CLOSURE WOUND 1/2 X4 (GAUZE/BANDAGES/DRESSINGS)
DECANTER SPIKE VIAL GLASS SM (MISCELLANEOUS) ×1 IMPLANT
DEVICE SUT QUICK LOAD TK 5 (STAPLE) ×4 IMPLANT
DEVICE SUT TI-KNOT TK 5X26 (MISCELLANEOUS) ×1 IMPLANT
DEVICE SUTURE ENDOST 10MM (ENDOMECHANICALS) ×3 IMPLANT
DEVICE TI KNOT TK5 (MISCELLANEOUS) ×1
DISSECTOR BLUNT TIP ENDO 5MM (MISCELLANEOUS) ×3 IMPLANT
DRAIN CHANNEL 19F RND (DRAIN) ×2 IMPLANT
DRAIN PENROSE 18X1/2 LTX STRL (DRAIN) ×3 IMPLANT
DRAPE LAPAROSCOPIC ABDOMINAL (DRAPES) ×3 IMPLANT
DRSG TEGADERM 2-3/8X2-3/4 SM (GAUZE/BANDAGES/DRESSINGS) IMPLANT
ELECT REM PT RETURN 9FT ADLT (ELECTROSURGICAL) ×3
ELECTRODE REM PT RTRN 9FT ADLT (ELECTROSURGICAL) ×1 IMPLANT
EVACUATOR SILICONE 100CC (DRAIN) ×2 IMPLANT
FELT TEFLON 4 X1 (Mesh General) ×1 IMPLANT
FILTER SMOKE EVAC LAPAROSHD (FILTER) ×3 IMPLANT
GLOVE ECLIPSE 8.0 STRL XLNG CF (GLOVE) ×15 IMPLANT
GLOVE INDICATOR 8.0 STRL GRN (GLOVE) ×6 IMPLANT
GOWN STRL REUS W/TWL LRG LVL3 (GOWN DISPOSABLE) ×5 IMPLANT
GOWN STRL REUS W/TWL XL LVL3 (GOWN DISPOSABLE) ×6 IMPLANT
GRASPER ENDO BABCOCK 10 (MISCELLANEOUS) IMPLANT
GRASPER ENDO BABCOCK 10MM (MISCELLANEOUS)
HANDLE STAPLE EGIA 4 XL (STAPLE) ×2 IMPLANT
HEMOSTAT SNOW SURGICEL 2X4 (HEMOSTASIS) ×4 IMPLANT
KIT BASIN OR (CUSTOM PROCEDURE TRAY) ×3 IMPLANT
NS IRRIG 1000ML POUR BTL (IV SOLUTION) ×3 IMPLANT
PENCIL BUTTON HOLSTER BLD 10FT (ELECTRODE) IMPLANT
QUICK LOAD TK 5 (STAPLE) ×4
RELOAD TRI 60 ART MED THCK PUR (STAPLE) ×6 IMPLANT
RETRACTOR LAPSCP 12X46 CVD (ENDOMECHANICALS) IMPLANT
RTRCTR LAPSCP 12X46 CVD (ENDOMECHANICALS)
SET IRRIG TUBING LAPAROSCOPIC (IRRIGATION / IRRIGATOR) ×3 IMPLANT
SHEARS HARMONIC ACE PLUS 36CM (ENDOMECHANICALS) ×3 IMPLANT
SOLUTION ANTI FOG 6CC (MISCELLANEOUS) ×3 IMPLANT
STAPLER VISISTAT 35W (STAPLE) ×2 IMPLANT
STRIP CLOSURE SKIN 1/2X4 (GAUZE/BANDAGES/DRESSINGS) IMPLANT
SUT ETHILON 3 0 PS 1 (SUTURE) ×2 IMPLANT
SUT MNCRL AB 4-0 PS2 18 (SUTURE) ×3 IMPLANT
SUT SURGIDAC NAB ES-9 0 48 120 (SUTURE) ×12 IMPLANT
TIP INNERVISION DETACH 40FR (MISCELLANEOUS) IMPLANT
TIP INNERVISION DETACH 50FR (MISCELLANEOUS) IMPLANT
TIP INNERVISION DETACH 56FR (MISCELLANEOUS) IMPLANT
TIPS INNERVISION DETACH 40FR (MISCELLANEOUS)
TOWEL OR 17X26 10 PK STRL BLUE (TOWEL DISPOSABLE) ×6 IMPLANT
TRAY FOLEY W/METER SILVER 14FR (SET/KITS/TRAYS/PACK) ×4 IMPLANT
TRAY LAPAROSCOPIC (CUSTOM PROCEDURE TRAY) ×3 IMPLANT
TROCAR BLADELESS OPT 5 75 (ENDOMECHANICALS) ×7 IMPLANT
TROCAR XCEL 12X100 BLDLESS (ENDOMECHANICALS) ×2 IMPLANT
TROCAR XCEL BLUNT TIP 100MML (ENDOMECHANICALS) ×3 IMPLANT
TROCAR XCEL NON-BLD 11X100MML (ENDOMECHANICALS) ×3 IMPLANT
TROCAR XCEL UNIV SLVE 11M 100M (ENDOMECHANICALS) ×4 IMPLANT
TUBING INSUFFLATION 10FT LAP (TUBING) ×3 IMPLANT

## 2014-09-26 NOTE — Anesthesia Procedure Notes (Signed)
Procedure Name: Intubation Date/Time: 09/26/2014 7:38 AM Performed by: Noralyn Pick D Pre-anesthesia Checklist: Patient identified, Emergency Drugs available, Suction available and Patient being monitored Patient Re-evaluated:Patient Re-evaluated prior to inductionOxygen Delivery Method: Circle System Utilized Preoxygenation: Pre-oxygenation with 100% oxygen Intubation Type: IV induction, Rapid sequence and Cricoid Pressure applied Laryngoscope Size: Mac and 4 Grade View: Grade II Tube type: Oral Tube size: 7.5 mm Number of attempts: 1 Airway Equipment and Method: Stylet and Oral airway Placement Confirmation: ETT inserted through vocal cords under direct vision,  positive ETCO2 and breath sounds checked- equal and bilateral Secured at: 21 cm Tube secured with: Tape Dental Injury: Teeth and Oropharynx as per pre-operative assessment

## 2014-09-26 NOTE — Interval H&P Note (Signed)
History and Physical Interval Note:  09/26/2014 7:26 AM  [name]  has presented today for surgery, with the diagnosis of RECURRENT HIATAL HERNIA   The various methods of treatment have been discussed with the patient and family. After consideration of risks, benefits and other options for treatment, the patient has consented to  Procedure(s): LAPAROSCOPIC POSSIBLE OPEN RECURRENT HIATAL HERNA REPAIR AND NISSEN FUNDOPLICATION (N/A) as a surgical intervention .  The patient's history has been reviewed, patient examined, no change in status, stable for surgery.  I have reviewed the patient's chart and labs.  Questions were answered to the patient's satisfaction.     [name] [name]

## 2014-09-26 NOTE — Op Note (Signed)
Operative Note  [name] female 80 y.o. 09/26/2014  PREOPERATIVE DX:  Recurrent hiatal hernia  POSTOPERATIVE DX:  Same  PROCEDURE:   Laparoscopic lysis of adhesions for 2 hours. Laparoscopic segmental gastrectomy. Upper endoscopy by Dr. Hassell Done.         Surgeon: [name]   Assistants: Johnathan Hausen M.D.  Anesthesia: General endotracheal anesthesia  Indications:   This is a 80 year old female who had an open repair of a hiatal hernia and Nissen fundoplication in 6237. She is developed symptoms of epigastric bloating and discomfort after eating as well as some shortness of breath. The hiatal hernia is large with most of her stomach in the chest and it is felt that this is causing her shortness of breath.  She now presents for laparoscopic possible open redo repair.    Procedure Detail:  She was brought to the operating room placed supine on the operating table and a general anesthetic was given. A Foley catheter was inserted. An oral gastric tube was attempted to be inserted but some resistance was met. The abdominal wall was widely sterilely prepped and draped.  A 5 mm incision was made in the right upper quadrant subcostal area. Using a 5 mm laparoscope and Optiview trocar, access was gained to the peritoneal cavity in a pneumoperitoneum was created. Introduction of laparoscope demonstrated no evidence of bleeding or organ injury. There were not any adhesions to the midline. An 11 mm trocar was placed lateral to the umbilicus on the left side. An 11 mm trocar was placed through part of the previous upper midline incision. Two 5 mm trocars were placed in the left upper quadrant.   Adhesions between the omentum and left lobe of the liver were taken down with a harmonic scalpel. Part of the stomach was densely adherent to the left lobe of the liver with no discernible plane. I subsequently directed my attention to the right crus and was able to reduce some of the hernia sac and  identify the right crus as well as part of the apex. However, in order to completely identify the anterior aspect of the hiatus, I needed to separate the stomach from the liver and there was no discernible plane. Using sharp dissection and the Harmonic scalpel I attempted to separate the stomach from the liver leaving some liver on the stomach and controlling bleeding from the liver with electrocautery. In order to separate the liver from the stomach, however, a gastrotomy was made because there is no discernible plane. Once I had the stomach separated from the left lobe of the liver, I then dissected it free from the left crus using blunt dissection and the Harmonic scalpel. A large hiatal hernia was present with at least two thirds of the stomach up in the chest. The fundus and cardia of the stomach were densely adherent to the pericardium. There was no good plane to separate these. I could not identify the gastroesophageal junction. Dr. Hassell Done performed upper endoscopy and he was able to identify the gastroesophageal junction but I was not able to see him transilluminate.  I tried to mobilize more of the stomach but it was being kept up in the mediastinum by the dense adhesions between the stomach and the pericardium which I could not separate safely. I subsequently closed the gastrotomy did a segmental gastric resection using the endoscopic stapler with staple line reinforcement. Upper endoscopy was performed and demonstrated patency of the stomach but some narrowing at the staple line. The endoscope easily  went through to the pylorus.  There was no evidence of staple line leak. I attempted to place a nasogastric tube using endoscopic guidance but could not get the tube into the stomach thus it was removed.  I drain was then placed into the abdominal cavity (#19 Blake) and brought out the lateral left upper quadrant 5 mm trocar incision. It was anchored to the skin with 3-0 nylon suture. The drain was then  placed in the mediastinum.  I then inspected the abdominal cavity and there is no evidence of bleeding or organ injury. This piece of Surgicel was placed in the area of the left lobe of liver.  The pneumoperitoneum was released and the trocars were removed. All trocar sites skin incisions were closed with 4-0 Monocryl subcuticular stitches. Steri-Strips and sterile dressings were applied.  She tolerated the procedure well without any apparent complications and she was taken to the recovery room in satisfactory condition.  Estimated Blood Loss:  300 mL         Drains: #19 Blake         Specimens: Segment of stomach        Complications:  * No complications entered in OR log *         Disposition: PACU - hemodynamically stable.         Condition: stable

## 2014-09-26 NOTE — H&P (Signed)
[name] is an 80 y.o. female.   Chief Complaint: Here for repair of recurrent hiatal hernia HPI:   She underwent open repair of a hiatal hernia and Nissen fundoplication in 4081.  She has epigastric bloating and discomfort after eating and well as dyspnea on exertion.  Only has intermittent reflux.  These symptoms are attributed to her large, recurrent hiatal hernia.  Past Medical History  Diagnosis Date  . Depressive disorder, not elsewhere classified   . Anxiety state, unspecified   . Pure hypercholesterolemia   . Esophageal reflux   . Infection of esophagostomy   . Congestive heart failure, unspecified     diastolic heart failure  . Pulmonary hypertension     PA systolic pressure 44-81 mmHg by echocardiogram, PA pressure 33/10 by cardiac catheterization PA saturation 62% thermodilution cardiac index 2.0 thick cardiac index 2.4  . Bradycardia     previously with coreg  . Tricuspid regurgitation     moderate tricuspid regurgitation by echocardiogram, prior use of anorexic agents  . Melanoma 1995    removal on back   . Coronary artery disease     a. Moderate to severe coronary disease involving the left anterior descending artery and right coronary artery.  Sequential stenosis in the LAD is significant.  Right coronary artery is moderate to severely likely nonischemic. Questionable small LVOT obstruction.  No Brockenbrough maneuver was performed.  Catheterization January 2013; b. 05/2014 MV no isch/infarct, EF 60%.  Marland Kitchen PONV (postoperative nausea and vomiting)   . Hypertension   . H/O hiatal hernia   . Gastritis   . [name]'s esophagus   . IBS (irritable bowel syndrome)   . Macular degeneration   . Dysrhythmia     Past Surgical History  Procedure Laterality Date  . Nissen fundoplication  8563  . Rotator cuff repair  ~ 2001    right  . Coronary angioplasty with stent placement  05/08/11    "1"  . Tonsillectomy  ~ 1944  . Shoulder arthroscopy  ~ 2004; 2005    left; "joint's  wore out"  . Cataract extraction w/ intraocular lens  implant, bilateral  ~ 2002  . Tubal ligation  1972  . Percutaneous coronary stent intervention (pci-s) N/A 05/08/2011    Procedure: PERCUTANEOUS CORONARY STENT INTERVENTION (PCI-S);  Surgeon: Wellington Hampshire, MD;  Location: [facility] C[facility] LAB;  Service: Cardiovascular;  Laterality: N/A;  . Esophageal manometry N/A 06/06/2014    Procedure: ESOPHAGEAL MANOMETRY (EM);  Surgeon: Jerene Bears, MD;  Location: [facility] ENDOSCOPY;  Service: Gastroenterology;  Laterality: N/A;    Family History  Problem Relation Age of Onset  . Heart attack Father   . Hypertension Father   . Diabetes Mother   . Hypertension Mother   . Colon cancer Neg Hx   . Esophageal cancer Neg Hx   . Rectal cancer Neg Hx   . Stomach cancer Neg Hx   . Skin cancer Brother     melanoma   Social History:  reports that she quit smoking about 26 years ago. Her smoking use included Cigarettes. She started smoking about 55 years ago. She has a 20 pack-year smoking history. She has never used smokeless tobacco. She reports that she does not drink alcohol or use illicit drugs.  Allergies:  Allergies  Allergen Reactions  . Sulfamethoxazole Rash    Medications Prior to Admission  Medication Sig Dispense Refill  . alendronate (FOSAMAX) 70 MG tablet Take 70 mg by mouth every 7 (seven) days.  Take with a full glass of water on an empty stomach on Mondays    . ALPRAZolam (XANAX) 0.25 MG tablet Take 0.25 mg by mouth 3 (three) times daily as needed for anxiety.     Marland Kitchen amLODipine (NORVASC) 10 MG tablet Take 10 mg by mouth daily.    Marland Kitchen aspirin 81 MG chewable tablet Chew 81 mg by mouth every evening.    . citalopram (CELEXA) 40 MG tablet Take 0.5 tablets (20 mg total) by mouth daily. (Patient taking differently: Take 20 mg by mouth at bedtime. )    . esomeprazole (NEXIUM) 40 MG capsule Take 20 mg by mouth daily before breakfast.     . ferrous sulfate 325 (65 FE) MG EC tablet Take 325 mg by mouth daily  with breakfast.      . loratadine (CLARITIN) 10 MG tablet Take 10 mg by mouth daily.    Marland Kitchen losartan (COZAAR) 100 MG tablet Take 1 tablet (100 mg total) by mouth daily. 30 tablet 6  . Multiple Vitamins-Minerals (PRESERVISION AREDS) TABS Take 2 tablets by mouth daily.    Marland Kitchen Propylene Glycol (SYSTANE BALANCE OP) Place 1-2 drops into both eyes daily as needed (dry eyes).    . rosuvastatin (CRESTOR) 10 MG tablet Take 10 mg by mouth at bedtime.     . traZODone (DESYREL) 100 MG tablet Take 100 mg by mouth at bedtime.     . Vitamin D, Ergocalciferol, (DRISDOL) 50000 UNITS CAPS Take 50,000 Units by mouth every 7 (seven) days. Patient takes on Tuesdays    . zolpidem (AMBIEN) 10 MG tablet Take 10 mg by mouth at bedtime.      Marland Kitchen albuterol (PROAIR HFA) 108 (90 BASE) MCG/ACT inhaler Inhale 2 puffs into the lungs every 6 (six) hours as needed for shortness of breath.     Marland Kitchen ibuprofen (ADVIL,MOTRIN) 200 MG tablet Take 400 mg by mouth every 6 (six) hours as needed for moderate pain.      Results for orders placed or performed during the hospital encounter of 09/26/14 (from the past 48 hour(s))  Protime-INR     Status: None   Collection Time: 09/26/14  6:50 AM  Result Value Ref Range   Prothrombin Time 13.1 11.6 - 15.2 seconds   INR 0.97 0.00 - 1.49   No results found.  Review of Systems  Constitutional: Negative for fever and chills.  Gastrointestinal: Positive for heartburn (Occasional) and nausea.    Blood pressure 181/72, pulse 61, temperature 97.8 F (36.6 C), temperature source Oral, resp. rate 18, height 5\' 2"  (1.575 m), weight 76.204 kg (168 lb), SpO2 100 %. Physical Exam  Constitutional:  Elderly female in NAD.  HENT:  Head: Normocephalic and atraumatic.  Cardiovascular: Normal rate and regular rhythm.   Respiratory: Effort normal and breath sounds normal.  GI: Soft. She exhibits no mass.  Upper midline scar.  Musculoskeletal: She exhibits no edema.  Neurological: She is alert.  Skin: Skin  is warm and dry.     Assessment/Plan Recurrent symptomatic large hiatal hernia  Plan:  Laparoscopic, possible open repair of recurrent hiatal hernia +/- Nissen fundoplication and gastrostomy tube.  Janaiyah Blackard J 09/26/2014, 7:20 AM

## 2014-09-26 NOTE — Op Note (Signed)
[name] [id number] Aug 04, 1934 09/26/2014  Preoperative diagnosis: recurrent hiatus hernia after a prior open Nissen in 1992  Postoperative diagnosis: Same   Procedure: Upper endoscopy   Surgeon: Catalina Antigua B. Hassell Done  M.D., FACS   Anesthesia: Gen.   Indications for procedure: This patient was undergoing a laparoscopic takedown of hiatus hernia.  Upon taking down the wrap, a gastrotomy was seen.    Description of procedure: The endoscopy was placed in the mouth and into the oropharynx and under endoscopic vision it was advanced to the esophagogastric junction.  This was performed with lap viewing also by Dr. Zella Richer.  The EG junciton was at 38 cm and the hole was at about 48 cm.    Next we began the closure of the gastrotomy.  This was performed with the Covidien 6 cm with TRS purple load.  Endoscopy was performed and prior to the final firing we verified that we were not crossing the stomach.    Following completion, we scoped her again and veritifed the integrity of the gastric closure.  There was a site of proximal old food that was pushed down into the gastric pouch.  We were unable to place an NG tube distally into the stomach and didn't want to risk esophageal perforation.  We did get down into the stomach and suck out the air and then removed the endoscope.   No bleeding or leaks were detected.  The scope was withdrawn without difficulty.     Matt B. Hassell Done, MD, FACS General, Bariatric, & Minimally Invasive Surgery [facility], [place]

## 2014-09-26 NOTE — Transfer of Care (Signed)
Immediate Anesthesia Transfer of Care Note  Patient: [name]  Procedure(s) Performed: Procedure(s): LAPAROSCOPIC  LYSIS OF ADHESIONS, SEGMENTAL GASTRECTOMY, PARTIAL REDUCTION OF HERNIA (N/A)  Patient Location: PACU  Anesthesia Type:General  Level of Consciousness: awake, alert  and oriented  Airway & Oxygen Therapy: Patient Spontanous Breathing and Patient connected to face mask oxygen  Post-op Assessment: Report given to RN and Post -op Vital signs reviewed and stable  Post vital signs: Reviewed and stable  Last Vitals:  Filed Vitals:   09/26/14 0547  BP: 181/72  Pulse: 61  Temp: 36.6 C  Resp: 18    Complications: No apparent anesthesia complications

## 2014-09-26 NOTE — Anesthesia Postprocedure Evaluation (Signed)
Anesthesia Post Note  Patient: [name]  Procedure(s) Performed: Procedure(s) (LRB): LAPAROSCOPIC  LYSIS OF ADHESIONS, SEGMENTAL GASTRECTOMY, PARTIAL REDUCTION OF HERNIA (N/A)  Anesthesia type: general  Patient location: PACU  Post pain: Pain level controlled  Post assessment: Patient's Cardiovascular Status Stable  Last Vitals:  Filed Vitals:   09/26/14 1130  BP: 158/79  Pulse: 77  Temp:   Resp: 20    Post vital signs: Reviewed and stable  Level of consciousness: sedated  Complications: No apparent anesthesia complications

## 2014-09-27 ENCOUNTER — Inpatient Hospital Stay (HOSPITAL_COMMUNITY): Payer: Medicare Other

## 2014-09-27 ENCOUNTER — Encounter (HOSPITAL_COMMUNITY): Payer: Self-pay | Admitting: General Surgery

## 2014-09-27 LAB — COMPREHENSIVE METABOLIC PANEL
ALK PHOS: 49 U/L (ref 38–126)
ALT: 92 U/L — AB (ref 14–54)
AST: 72 U/L — ABNORMAL HIGH (ref 15–41)
Albumin: 3.6 g/dL (ref 3.5–5.0)
Anion gap: 8 (ref 5–15)
BUN: 21 mg/dL — ABNORMAL HIGH (ref 6–20)
CO2: 25 mmol/L (ref 22–32)
Calcium: 7.9 mg/dL — ABNORMAL LOW (ref 8.9–10.3)
Chloride: 105 mmol/L (ref 101–111)
Creatinine, Ser: 1.04 mg/dL — ABNORMAL HIGH (ref 0.44–1.00)
GFR calc Af Amer: 59 mL/min — ABNORMAL LOW (ref >60)
GFR calc non Af Amer: 50 mL/min — ABNORMAL LOW (ref >60)
GLUCOSE: 141 mg/dL — AB (ref 65–99)
POTASSIUM: 3.8 mmol/L (ref 3.5–5.1)
SODIUM: 138 mmol/L (ref 135–145)
Total Bilirubin: 0.3 mg/dL (ref 0.3–1.2)
Total Protein: 5.8 g/dL — ABNORMAL LOW (ref 6.5–8.1)

## 2014-09-27 LAB — CBC WITH DIFFERENTIAL/PLATELET
Basophils Absolute: 0 10*3/uL (ref 0.0–0.1)
Basophils Relative: 0 % (ref 0–1)
EOS ABS: 0 10*3/uL (ref 0.0–0.7)
Eosinophils Relative: 0 % (ref 0–5)
HCT: 33.1 % — ABNORMAL LOW (ref 36.0–46.0)
Hemoglobin: 10.3 g/dL — ABNORMAL LOW (ref 12.0–15.0)
LYMPHS ABS: 0.9 10*3/uL (ref 0.7–4.0)
Lymphocytes Relative: 10 % — ABNORMAL LOW (ref 12–46)
MCH: 26.3 pg (ref 26.0–34.0)
MCHC: 31.1 g/dL (ref 30.0–36.0)
MCV: 84.7 fL (ref 78.0–100.0)
MONOS PCT: 9 % (ref 3–12)
Monocytes Absolute: 0.9 10*3/uL (ref 0.1–1.0)
Neutro Abs: 8 10*3/uL — ABNORMAL HIGH (ref 1.7–7.7)
Neutrophils Relative %: 81 % — ABNORMAL HIGH (ref 43–77)
PLATELETS: 148 10*3/uL — AB (ref 150–400)
RBC: 3.91 MIL/uL (ref 3.87–5.11)
RDW: 14.7 % (ref 11.5–15.5)
WBC: 9.8 10*3/uL (ref 4.0–10.5)

## 2014-09-27 MED ORDER — IOHEXOL 300 MG/ML  SOLN
150.0000 mL | Freq: Once | INTRAMUSCULAR | Status: AC | PRN
  Administered 2014-09-27: 50 mL via ORAL

## 2014-09-27 NOTE — Progress Notes (Signed)
Contrast has passed through to the colon.  Will start liquds.

## 2014-09-27 NOTE — Progress Notes (Signed)
Utilization review completed.  

## 2014-09-27 NOTE — Progress Notes (Signed)
1 Day Post-Op  Subjective: Some nausea.  Objective: Vital signs in last 24 hours: Temp:  [97.4 F (36.3 C)-99.9 F (37.7 C)] 98.7 F (37.1 C) (06/28 1001) Pulse Rate:  [62-95] 84 (06/28 1001) Resp:  [16-18] 18 (06/28 1001) BP: (135-182)/(55-96) 155/74 mmHg (06/28 1001) SpO2:  [92 %-98 %] 96 % (06/28 1001) Last BM Date: 09/24/14  Intake/Output from previous day: 06/27 0701 - 06/28 0700 In: 1650 [I.V.:1400; IV Piggyback:250] Out: 1192 [Urine:895; Drains:147; Blood:150] Intake/Output this shift:    PE: General- In NAD Abdomen- soft, distended, active bowel sounds, dried drainage on dressings, serosanguinous drain output  Lab Results:   Recent Labs  09/26/14 1145 09/27/14 0505  WBC  --  9.8  HGB 11.3* 10.3*  HCT 35.8* 33.1*  PLT  --  148*   BMET  Recent Labs  09/27/14 0505  NA 138  K 3.8  CL 105  CO2 25  GLUCOSE 141*  BUN 21*  CREATININE 1.04*  CALCIUM 7.9*   PT/INR  Recent Labs  09/26/14 0650  LABPROT 13.1  INR 0.97   Comprehensive Metabolic Panel:    Component Value Date/Time   NA 138 09/27/2014 0505   NA 140 09/21/2014 0950   K 3.8 09/27/2014 0505   K 4.2 09/21/2014 0950   CL 105 09/27/2014 0505   CL 102 09/21/2014 0950   CO2 25 09/27/2014 0505   CO2 28 09/21/2014 0950   BUN 21* 09/27/2014 0505   BUN 27* 09/21/2014 0950   CREATININE 1.04* 09/27/2014 0505   CREATININE 1.12* 09/21/2014 0950   GLUCOSE 141* 09/27/2014 0505   GLUCOSE 109* 09/21/2014 0950   CALCIUM 7.9* 09/27/2014 0505   CALCIUM 9.7 09/21/2014 0950   AST 72* 09/27/2014 0505   AST 14 02/17/2014 1228   ALT 92* 09/27/2014 0505   ALT 13 02/17/2014 1228   ALKPHOS 49 09/27/2014 0505   ALKPHOS 62 02/17/2014 1228   BILITOT 0.3 09/27/2014 0505   BILITOT 0.6 02/17/2014 1228   PROT 5.8* 09/27/2014 0505   PROT 6.2 02/17/2014 1228   ALBUMIN 3.6 09/27/2014 0505   ALBUMIN 4.0 02/17/2014 1228     Studies/Results: No results found.  Anti-infectives: Anti-infectives    Start      Dose/Rate Route Frequency Ordered Stop   09/26/14 1600  ceFAZolin (ANCEF) IVPB 1 g/50 mL premix     1 g 100 mL/hr over 30 Minutes Intravenous Every 8 hours 09/26/14 1214 09/27/14 1037   09/26/14 0613  ceFAZolin (ANCEF) IVPB 2 g/50 mL premix     2 g 100 mL/hr over 30 Minutes Intravenous On call to O.R. 09/26/14 0762 09/26/14 0740      Assessment Active Problems:  1. Recurrent hiatal hernia s/p attempted repair-no leak on UGI but stomach does not completely empty; most likely has some edema at site where gastrotomy was closed as the area was widely patent and easily allowed passage of an endoscope multiple times intraoperatively.  2.  HTN-on prn Hydralazine.      LOS: 1 day   Plan: Check portable abdominal x-[place] this afternoon to see if contrast passes into small bowel.  Remove foley.  OOB.    [name] 09/27/2014

## 2014-09-28 LAB — CBC WITH DIFFERENTIAL/PLATELET
BASOS ABS: 0 10*3/uL (ref 0.0–0.1)
Basophils Relative: 0 % (ref 0–1)
Eosinophils Absolute: 0.1 10*3/uL (ref 0.0–0.7)
Eosinophils Relative: 1 % (ref 0–5)
HCT: 32.8 % — ABNORMAL LOW (ref 36.0–46.0)
Hemoglobin: 10.3 g/dL — ABNORMAL LOW (ref 12.0–15.0)
LYMPHS ABS: 1 10*3/uL (ref 0.7–4.0)
Lymphocytes Relative: 12 % (ref 12–46)
MCH: 27.2 pg (ref 26.0–34.0)
MCHC: 31.4 g/dL (ref 30.0–36.0)
MCV: 86.5 fL (ref 78.0–100.0)
Monocytes Absolute: 0.7 10*3/uL (ref 0.1–1.0)
Monocytes Relative: 8 % (ref 3–12)
NEUTROS PCT: 79 % — AB (ref 43–77)
Neutro Abs: 6.3 10*3/uL (ref 1.7–7.7)
PLATELETS: 140 10*3/uL — AB (ref 150–400)
RBC: 3.79 MIL/uL — ABNORMAL LOW (ref 3.87–5.11)
RDW: 14.6 % (ref 11.5–15.5)
WBC: 8 10*3/uL (ref 4.0–10.5)

## 2014-09-28 MED ORDER — OXYCODONE HCL 5 MG PO TABS: 5.0000 mg | ORAL_TABLET | ORAL | Status: DC | PRN

## 2014-09-28 NOTE — Discharge Instructions (Signed)
No lifting pushing or pulling over 10 pounds for 6 weeks.  Empty drain and record output twice a day.  Appointment with Dr. Zella Richer in the office on Wednesday, July 6 at 8:30 am.  Drink plenty of fluids.  Eat six small meals per day.  Level 1 diet which includes thin and thick liquids and "blendarized food."  Walk!  Call for high fever (>101.5), vomiting, wound problems.  Laxative of choice as needed.   [facility]   EATING AFTER YOUR ESOPHAGEAL SURGERY  After your esophageal surgery, you can expect some difficulty swallowing.  If food sticks when you eat, it is called "dysphagia".  This is due to swelling around your surgery site and will most likely resolve within a few weeks.  To help you through this temporary phase, we start you out on a pureed diet.  Your first meal in the hospital was clear liquids.  You should have been given a pureed diet by the time you left the hospital.  We ask patients to stay on a pureed diet for the first two weeks to avoid anything getting "stuck" near your recent surgery.  Don't be alarmed if your ability to swallow doesn't progress according to this plan.  Everyone is different and some take longer or shorter.  Use common sense.  If you are having trouble swallowing a particular food, then avoid it.  If food is sticking when you advance your diet, go back to the previous day or two.  In general some simple rules to follow are:  Maintain an upright position (as near 90 degrees as possible) whenever eating or drinking.  Take small bites - only 1/2 to 1 teaspoon at a time.  Eat slowly.  It may also help to eat only one food at a time.  Avoid talking while eating.  Do not mix solid foods and liquids in the same mouthful and do not "wash foods down" with liquids, unless you have been instructed to do so by your surgeon.  Eat in a relaxed atmosphere, with no distractions.  Following each meal, sit in an upright position (90  degree angle) for 60 minutes.  Avoid carbonated (bubbly) drinks.  If food does stick, don't panic.  Try to relax and let the food pass on its own.  Sipping strong hot black tea can also help.  If you have any questions please call our office at [phone].   LEVEL 1 PUREED FOODS:  1ST 2 WEEKS AFTER SURGERY Foods in this group are pureed or blenderized to a smooth, mashed potato-like consistency.  If necessary, the pureed foods can keep their shape with the addition of a thickening agent.  Meat should be pureed to a smooth pasty consistency.  Hot broth or gravy may be added to the pureed meat, approximately 1 oz. of liquid per 3 oz. serving of meat. CAUTION:  If any foods do not puree into a smooth consistency, it may make eating for swallowing more difficult.  For example, zucchini seeds sometimes do not blend well. Hot Foods Cold Foods  Pureed scrambled eggs and cheese Pureed cottage cheese  Baby cereals Thickened juices and nectars  Thinned cooked cereals (no lumps) Thickened milk or eggnog  Pureed [place] toast or pancakes Ensure  Mashed potatoes Ice cream  Pureed parsley, au gratin, scalloped potatoes, candied sweet potatoes Fruit or [place] ice, sherbet  Pureed buttered or alfredo noodles Plain yogurt  Pureed vegetables (no corn or peas) Instant breakfast  Pureed soups and  creamed soups Smooth pudding, mousse, custard  Pureed scalloped apples Whipped gelatin  Gravies Sugar, syrup, honey, jelly  Sauces, cheese, tomato, barbecue, white, creamed Cream  Any baby food Creamer  Alcohol in moderation (not beer or champagne) Margarine  Coffee or tea Mayonnaise   Ketchup, mustard   Apple sauce   SAMPLE MENU:  PUREED DIET Breakfast Lunch Dinner   Orange juice, 1/2 cup  Cream of wheat, 1/2 cup  Pineapple juice, 1/2 cup  Pureed [place], barley soup, 3/4 cup  Pureed Hawaiian chicken, 3 oz   Scrambled eggs, mashed or blended with cheese, 1/2 cup  Tea or coffee, 1 cup   Whole  milk, 1 cup   Non-dairy creamer, 2 Tbsp.  Mashed potatoes, 1/2 cup  Pureed cooled broccoli, 1/2 cup  Apple sauce, 1/2 cup  Coffee or tea  Mashed potatoes, 1/2 cup  Pureed spinach, 1/2 cup  Frozen yogurt, 1/2 cup  Tea or coffee    LEVEL 2 After your first 2 weeks, you can advance to a soft diet.  Keep on this diet until everything goes down easily. Hot Foods Cold Foods  White fish Cottage cheese  Stuffed fish Junior baby fruit  Baby food meals Semi thickened juices  Minced soft cooked, scrambled, poached eggs nectars  Souffle & omelets Ripe mashed bananas  Cooked cereals Canned fruit, pineapple sauce, milk  potatoes Milkshake  Buttered or Alfredo noodles Custard  Cooked cooled vegetable Puddings, including tapioca  Sherbet Yogurt  Vegetable soup or alphabet soup Fruit ice, [place] ice  Gravies Whipped gelatin  Sugar, syrup, honey, jelly Junior baby desserts  Sauces:  Cheese, creamed, barbecue, tomato, white Cream  Coffee or tea Margarine   SAMPLE MENU:  LEVEL 2 Breakfast Lunch Dinner   Orange juice, 1/2 cup  Oatmeal, 1/2 cup  Scrambled eggs with cheese, 1/2 cup  Decaffeinated tea, 1 cup  Whole milk, 1 cup  Non-dairy creamer, 2 Tbsp  Pineapple juice, 1/2 cup  Minced beef, 3 oz  Gravy, 2 Tbsp  Mashed potatoes, 1/2 cup  Minced fresh broccoli, 1/2 cup  Applesauce, 1/2 cup  Coffee, 1 cup  [place], barley soup, 3/4 cup  Minced Hawaiian chicken, 3 oz  Mashed potatoes, 1/2 cup  Cooked spinach, 1/2 cup  Frozen yogurt, 1/2 cup  Non-dairy creamer, 2 Tbsp    LEVEL 3 After all the foods in level 2 (soft diet) are passing through well you should advance up to the next level.  It is still important to cut these foods into small pieces and eat slowly. Hot Foods Cold Foods  Poultry Cottage cheese  Chopped Swedish meatballs Yogurt  Meat salads (ground or flaked meat) Milk  Flaked fish (tuna) Milkshakes  Poached or scrambled eggs Soft, cold, dry cereal    Souffles and omelets Fruit juices or nectars  Cooked cereals Chopped canned fruit  Chopped [place] toast or pancakes Canned fruit cocktail  Noodles or pasta (no rice) Pudding, mousse, custard  Cooked vegetables (no frozen peas, corn, or mixed vegetables) Green salad  Canned small sweet peas Ice cream  Creamed soup or vegetable soup Fruit ice, [place] ice  Pureed vegetable soup or alphabet soup Non-dairy creamer  Ground scalloped apples Margarine  Gravies Mayonnaise  Sauces:  Cheese, creamed, barbecue, tomato, white Ketchup  Coffee or tea Mustard   SAMPLE MENU:  LEVEL 3 Breakfast Lunch Dinner   Orange juice, 1/2 cup  Oatmeal, 1/2 cup  Scrambled eggs with cheese, 1/2 cup  Decaffeinated tea, 1 cup  Whole  milk, 1 cup  Non-dairy creamer, 2 Tbsp  Ketchup, 1 Tbsp  Margarine, 1 tsp  Salt, 1/4 tsp  Sugar, 2 tsp  Pineapple juice, 1/2 cup  Ground beef, 3 oz  Gravy, 2 Tbsp  Mashed potatoes, 1/2 cup  Cooked spinach, 1/2 cup  Applesauce, 1/2 cup  Decaffeinated coffee  Whole milk  Non-dairy creamer, 2 Tbsp  Margarine, 1 tsp  Salt, 1/4 tsp  Pureed [place], barley soup, 3/4 cup  Barbecue chicken, 3 oz  Mashed potatoes, 1/2 cup  Ground fresh broccoli, 1/2 cup  Frozen yogurt, 1/2 cup  Decaffeinated tea, 1 cup  Non-dairy creamer, 2 Tbsp  Margarine, 1 tsp  Salt, 1/4 tsp  Sugar, 1 tsp    LEVEL 4:  REGULAR FOODS Foods in this group are soft, moist, regularly textured foods.  This level includes red meat and breads, which tend to be the hardest things to swallow.  Eat very slow, chew well and continue to avoid carbonated drinks. Hot Foods Cold Foods  Baked fish or skinned Soft cheeses - cottage cheese  Souffles and omelets Cream cheese  Eggs Yogurt  Stuffed shells Milk  Spaghetti with meat sauce Milkshakes  Cooked cereal Cold dry cereals (no nuts, dried fruit, coconut)  [place] toast or pancakes Crackers  Buttered toast Fruit juices or nectars  Noodles  or pasta (no rice) Canned fruit  Potatoes (all types) Ripe bananas  Soft, cooked vegetables (no corn, lima, or baked beans) Peeled, ripe, fresh fruit  Creamed soups or vegetable soup Cakes (no nuts, dried fruit, coconut)  Canned chicken noodle soup Plain doughnuts  Gravies Ice cream  Bacon dressing Pudding, mousse, custard  Sauces:  Cheese, creamed, barbecue, tomato, white Fruit ice, [place] ice, sherbet  Decaffeinated tea or coffee Whipped gelatin  Pork chops Regular gelatin   Canned fruited gelatin molds   Sugar, syrup, honey, jam, jelly   Cream   Non-dairy   Margarine   Oil   Mayonnaise   Ketchup   Mustard

## 2014-09-28 NOTE — Progress Notes (Signed)
2 Days Post-Op  Subjective: Feels much better this morning.  Tolerating full liquid diet.  Passing gas.  Objective: Vital signs in last 24 hours: Temp:  [98.4 F (36.9 C)-99.5 F (37.5 C)] 98.4 F (36.9 C) (06/29 0549) Pulse Rate:  [84-93] 86 (06/29 0549) Resp:  [18] 18 (06/29 0549) BP: (154-171)/(66-90) 171/84 mmHg (06/29 0549) SpO2:  [94 %-96 %] 96 % (06/29 0549) Last BM Date: 09/24/14  Intake/Output from previous day: 06/28 0701 - 06/29 0700 In: 1760 [P.O.:960; I.V.:800] Out: 2855 [Urine:2650; Drains:205] Intake/Output this shift:    PE: General- In NAD Abdomen- soft, much less distended, incisions are clean and intact, serosanguinous drain output  Lab Results:   Recent Labs  09/27/14 0505 09/28/14 0438  WBC 9.8 8.0  HGB 10.3* 10.3*  HCT 33.1* 32.8*  PLT 148* 140*   BMET  Recent Labs  09/27/14 0505  NA 138  K 3.8  CL 105  CO2 25  GLUCOSE 141*  BUN 21*  CREATININE 1.04*  CALCIUM 7.9*   PT/INR  Recent Labs  09/26/14 0650  LABPROT 13.1  INR 0.97   Comprehensive Metabolic Panel:    Component Value Date/Time   NA 138 09/27/2014 0505   NA 140 09/21/2014 0950   K 3.8 09/27/2014 0505   K 4.2 09/21/2014 0950   CL 105 09/27/2014 0505   CL 102 09/21/2014 0950   CO2 25 09/27/2014 0505   CO2 28 09/21/2014 0950   BUN 21* 09/27/2014 0505   BUN 27* 09/21/2014 0950   CREATININE 1.04* 09/27/2014 0505   CREATININE 1.12* 09/21/2014 0950   GLUCOSE 141* 09/27/2014 0505   GLUCOSE 109* 09/21/2014 0950   CALCIUM 7.9* 09/27/2014 0505   CALCIUM 9.7 09/21/2014 0950   AST 72* 09/27/2014 0505   AST 14 02/17/2014 1228   ALT 92* 09/27/2014 0505   ALT 13 02/17/2014 1228   ALKPHOS 49 09/27/2014 0505   ALKPHOS 62 02/17/2014 1228   BILITOT 0.3 09/27/2014 0505   BILITOT 0.6 02/17/2014 1228   PROT 5.8* 09/27/2014 0505   PROT 6.2 02/17/2014 1228   ALBUMIN 3.6 09/27/2014 0505   ALBUMIN 4.0 02/17/2014 1228     Studies/Results: Dg Abd Portable 1v  09/27/2014    CLINICAL DATA:  Recent hiatal hernia repair, history melanoma, coronary artery disease, pulmonary hypertension, CHF, esophageal reflux  EXAM: PORTABLE ABDOMEN - 1 VIEW  COMPARISON:  Portable exam 1616 hours compared to earlier 09/27/2014 upper GI exam  FINDINGS: Oral contrast has progressed into the colon to the mid transverse colon with a small amount of residual contrast in distal small bowel.  No evidence of bowel obstruction.  Gas in distal colon from distal transverse colon to rectum.  Osseous structures demineralized.  IMPRESSION: GI contrast from upper GI exam has progressed to the distal small bowel and proximal half of colon.   Electronically Signed   By: Lavonia Dana M.D.   On: 09/27/2014 16:29   Dg Duanne Limerick W/water Sol Cm  09/27/2014   CLINICAL DATA:  Attempted hiatal hernia repair, postop lysis of adhesions, laparoscopic segmental gastrectomy  EXAM: WATER SOLUBLE UPPER GI SERIES  TECHNIQUE: Single-column upper GI series was performed using water soluble contrast.  CONTRAST:  31mL OMNIPAQUE IOHEXOL 300 MG/ML  SOLN  COMPARISON:  03/14/2014  FLUOROSCOPY TIME:  Fluoroscopy Time (in minutes and seconds): 2 minutes 21 seconds  Number of Acquired Images:  17  FINDINGS: Scout radiograph demonstrates surgical clips in the mid abdomen and a surgical drain overlying the right  heart.  Moderate to large hiatal hernia overlying the right heart border.  No evidence of leak.  Contrast does not opacify the distal gastric antrum/proximal duodenum, likely secondary to postoperative edema.  IMPRESSION: Moderate to large hiatal hernia.  No evidence of leak.  Contrast does not opacify the distal gastric antrum/proximal duodenum, likely secondary to postoperative edema.   Electronically Signed   By: Julian Hy M.D.   On: 09/27/2014 12:05    Anti-infectives: Anti-infectives    Start     Dose/Rate Route Frequency Ordered Stop   09/26/14 1600  ceFAZolin (ANCEF) IVPB 1 g/50 mL premix     1 g 100 mL/hr over 30 Minutes  Intravenous Every 8 hours 09/26/14 1214 09/27/14 1037   09/26/14 0613  ceFAZolin (ANCEF) IVPB 2 g/50 mL premix     2 g 100 mL/hr over 30 Minutes Intravenous On call to O.R. 09/26/14 5670 09/26/14 0740      Assessment Active Problems:  1. Recurrent hiatal hernia s/p attempted repair-doing much better today; tolerating full liquid diet. 2.  HTN-on prn Hydralazine.      LOS: 2 days   Plan:  Discharge today with drain in.  Follow up in office in one week.  Discharge instructions given to her.   Arlena Marsan [name] 09/28/2014

## 2014-09-28 NOTE — Progress Notes (Signed)
Patient is alert and oriented. VS Stable. No s/s of acute distress. Tolerating diet well. Patient being discharged to home. Reviewed patient medications, education and instructions. Patient states understanding, including home diet instructions for blendarized and pureed foods. Patient is to be discharged when family is able to pick her up. Will leave IV in until her ride is available. Prescriptions given to patient.

## 2014-10-19 NOTE — Discharge Summary (Signed)
Physician Discharge Summary  Patient ID: [name] MRN: [id number] DOB/AGE: 1934/07/18 80 y.o.  Admit date: 09/26/2014 Discharge date: 10/19/2014  Admission Diagnoses:  Recurrent hiatal hernia  Discharge Diagnoses: Recurrent hiatal hernia  Hypertension  Coronary artery disease    Discharged Condition: good  Hospital Course: She was admitted and underwent attempted laparoscopic reduction of her hiatal hernia which could not be done because the stomach was so densely adherent to the pericardium and pleura to the gastroesophageal junction could not be seen. The stomach could only be partially reduced. Gastrotomy was made dissecting the stomach free from from the liver and this was closed by way of staples. Postoperatively, the upper GI showed delayed emptying of the stomach but with time contrast went through. She subsequently started on a full liquid diet and tolerated this well. She was able to be discharged to home on her second postoperative day. She was put on a limited diet strict dietary instructions were also given to her. Activity was strictly limited. She will follow-up in the office in 2-3 weeks.  Discharge Exam: Blood pressure 171/84, pulse 86, temperature 98.4 F (36.9 C), temperature source Oral, resp. rate 18, height 5\' 2"  (1.575 m), weight 76.204 kg (168 lb), SpO2 96 %.   Disposition: 01-Home or Self Care     Medication List    TAKE these medications        alendronate 70 MG tablet  Commonly known as:  FOSAMAX  Take 70 mg by mouth every 7 (seven) days. Take with a full glass of water on an empty stomach on Mondays     ALPRAZolam 0.25 MG tablet  Commonly known as:  XANAX  Take 0.25 mg by mouth 3 (three) times daily as needed for anxiety.     amLODipine 10 MG tablet  Commonly known as:  NORVASC  Take 10 mg by mouth daily.     aspirin 81 MG chewable tablet  Chew 81 mg by mouth every evening.     citalopram 40 MG tablet  Commonly known as:  CELEXA  Take  0.5 tablets (20 mg total) by mouth daily.     esomeprazole 40 MG capsule  Commonly known as:  NEXIUM  Take 20 mg by mouth daily before breakfast.     ferrous sulfate 325 (65 FE) MG EC tablet  Take 325 mg by mouth daily with breakfast.     ibuprofen 200 MG tablet  Commonly known as:  ADVIL,MOTRIN  Take 400 mg by mouth every 6 (six) hours as needed for moderate pain.     loratadine 10 MG tablet  Commonly known as:  CLARITIN  Take 10 mg by mouth daily.     losartan 100 MG tablet  Commonly known as:  COZAAR  Take 1 tablet (100 mg total) by mouth daily.     oxyCODONE 5 MG immediate release tablet  Commonly known as:  Oxy IR/ROXICODONE  Take 1-2 tablets (5-10 mg total) by mouth every 4 (four) hours as needed for moderate pain, severe pain or breakthrough pain.     PRESERVISION AREDS Tabs  Take 2 tablets by mouth daily.     PROAIR HFA 108 (90 BASE) MCG/ACT inhaler  Generic drug:  albuterol  Inhale 2 puffs into the lungs every 6 (six) hours as needed for shortness of breath.     rosuvastatin 10 MG tablet  Commonly known as:  CRESTOR  Take 10 mg by mouth at bedtime.     SYSTANE BALANCE OP  Place  1-2 drops into both eyes daily as needed (dry eyes).     traZODone 100 MG tablet  Commonly known as:  DESYREL  Take 100 mg by mouth at bedtime.     Vitamin D (Ergocalciferol) 50000 UNITS Caps capsule  Commonly known as:  DRISDOL  Take 50,000 Units by mouth every 7 (seven) days. Patient takes on Tuesdays     zolpidem 10 MG tablet  Commonly known as:  AMBIEN  Take 10 mg by mouth at bedtime.         Signed: [name] 10/19/2014, 7:49 AM

## 2015-01-09 ENCOUNTER — Ambulatory Visit (INDEPENDENT_AMBULATORY_CARE_PROVIDER_SITE_OTHER): Payer: Medicare Other | Admitting: Cardiovascular Disease

## 2015-01-09 ENCOUNTER — Encounter: Payer: Self-pay | Admitting: Cardiovascular Disease

## 2015-01-09 VITALS — BP 130/82 | HR 66 | Ht 62.0 in | Wt 147.0 lb

## 2015-01-09 DIAGNOSIS — I10 Essential (primary) hypertension: Secondary | ICD-10-CM

## 2015-01-09 DIAGNOSIS — I2510 Atherosclerotic heart disease of native coronary artery without angina pectoris: Secondary | ICD-10-CM | POA: Diagnosis not present

## 2015-01-09 DIAGNOSIS — Z9889 Other specified postprocedural states: Secondary | ICD-10-CM | POA: Diagnosis not present

## 2015-01-09 DIAGNOSIS — I480 Paroxysmal atrial fibrillation: Secondary | ICD-10-CM

## 2015-01-09 DIAGNOSIS — R109 Unspecified abdominal pain: Secondary | ICD-10-CM

## 2015-01-09 DIAGNOSIS — K449 Diaphragmatic hernia without obstruction or gangrene: Secondary | ICD-10-CM

## 2015-01-09 DIAGNOSIS — Z9861 Coronary angioplasty status: Secondary | ICD-10-CM

## 2015-01-09 NOTE — Progress Notes (Signed)
Patient ID: [name], female   DOB: January 16, 1935, 80 y.o.   MRN: [id number]      SUBJECTIVE: The patient presents for routine cardiovascular follow-up. She was hospitalized for chest pain in February 2016 and underwent a normal nuclear stress test, LVEF 60%. She saw a pulmonologist earlier this year who determined she did not have any pulmonary hypertension or significant pulmonary disease whatsoever. Her symptoms of both chest pain and shortness of breath were attributed to a hiatal hernia. Surgical repair was attempted but was not entirely successful in 09/2014 as it was adherent to the pericardium and pleura.  She experienced some right flank pain last week which did not allow her to sleep. She has some mild residual soreness. She denies hematuria and dysuria. She has not seen her PCP since this episode.  She denies exertional chest pain, shortness of breath, palpitations, and leg swelling.  She tries to stay very active participating in multiple classes at the [facility] including yoga, line dancing, and pickle ball.  Review of Systems: As per "subjective", otherwise negative.  Allergies  Allergen Reactions  . Sulfamethoxazole Rash    Current Outpatient Prescriptions  Medication Sig Dispense Refill  . albuterol (PROAIR HFA) 108 (90 BASE) MCG/ACT inhaler Inhale 2 puffs into the lungs every 6 (six) hours as needed for shortness of breath.     Marland Kitchen alendronate (FOSAMAX) 70 MG tablet Take 70 mg by mouth every 7 (seven) days. Take with a full glass of water on an empty stomach on Mondays    . ALPRAZolam (XANAX) 0.25 MG tablet Take 0.25 mg by mouth 3 (three) times daily as needed for anxiety.     Marland Kitchen amLODipine (NORVASC) 10 MG tablet Take 10 mg by mouth daily.    Marland Kitchen aspirin 81 MG chewable tablet Chew 81 mg by mouth every evening.    . citalopram (CELEXA) 40 MG tablet Take 0.5 tablets (20 mg total) by mouth daily. (Patient taking differently: Take 20 mg by mouth at bedtime. )    . doxycycline  (VIBRAMYCIN) 100 MG capsule Take 100 mg by mouth 2 (two) times daily.    Marland Kitchen esomeprazole (NEXIUM) 40 MG capsule Take 20 mg by mouth daily before breakfast.     . ferrous sulfate 325 (65 FE) MG EC tablet Take 325 mg by mouth daily with breakfast.      . ibuprofen (ADVIL,MOTRIN) 200 MG tablet Take 400 mg by mouth every 6 (six) hours as needed for moderate pain.    Marland Kitchen loratadine (CLARITIN) 10 MG tablet Take 10 mg by mouth daily.    Marland Kitchen losartan (COZAAR) 100 MG tablet Take 1 tablet (100 mg total) by mouth daily. 30 tablet 6  . Multiple Vitamins-Minerals (PRESERVISION AREDS) TABS Take 2 tablets by mouth daily.    Marland Kitchen Propylene Glycol (SYSTANE BALANCE OP) Place 1-2 drops into both eyes daily as needed (dry eyes).    . rosuvastatin (CRESTOR) 10 MG tablet Take 10 mg by mouth at bedtime.     . traZODone (DESYREL) 100 MG tablet Take 100 mg by mouth at bedtime.     . Vitamin D, Ergocalciferol, (DRISDOL) 50000 UNITS CAPS Take 50,000 Units by mouth every 7 (seven) days. Patient takes on Tuesdays    . zolpidem (AMBIEN) 10 MG tablet Take 10 mg by mouth at bedtime.       No current facility-administered medications for this visit.    Past Medical History  Diagnosis Date  . Depressive disorder, not elsewhere classified   .  Anxiety state, unspecified   . Pure hypercholesterolemia   . Esophageal reflux   . Infection of esophagostomy ([place])   . Congestive heart failure, unspecified     diastolic heart failure  . Pulmonary hypertension (HCC)     PA systolic pressure 42-35 mmHg by echocardiogram, PA pressure 33/10 by cardiac catheterization PA saturation 62% thermodilution cardiac index 2.0 thick cardiac index 2.4  . Bradycardia     previously with coreg  . Tricuspid regurgitation     moderate tricuspid regurgitation by echocardiogram, prior use of anorexic agents  . Melanoma ([place]) 1995    removal on back   . Coronary artery disease     a. Moderate to severe coronary disease involving the left anterior  descending artery and right coronary artery.  Sequential stenosis in the LAD is significant.  Right coronary artery is moderate to severely likely nonischemic. Questionable small LVOT obstruction.  No Brockenbrough maneuver was performed.  Catheterization January 2013; b. 05/2014 MV no isch/infarct, EF 60%.  Marland Kitchen PONV (postoperative nausea and vomiting)   . Hypertension   . H/O hiatal hernia   . Gastritis   . [name]'s esophagus   . IBS (irritable bowel syndrome)   . Macular degeneration   . Dysrhythmia     Past Surgical History  Procedure Laterality Date  . Nissen fundoplication  3614  . Rotator cuff repair  ~ 2001    right  . Coronary angioplasty with stent placement  05/08/11    "1"  . Tonsillectomy  ~ 1944  . Shoulder arthroscopy  ~ 2004; 2005    left; "joint's wore out"  . Cataract extraction w/ intraocular lens  implant, bilateral  ~ 2002  . Tubal ligation  1972  . Percutaneous coronary stent intervention (pci-s) N/A 05/08/2011    Procedure: PERCUTANEOUS CORONARY STENT INTERVENTION (PCI-S);  Surgeon: Wellington Hampshire, MD;  Location: [facility] CATH LAB;  Service: Cardiovascular;  Laterality: N/A;  . Esophageal manometry N/A 06/06/2014    Procedure: ESOPHAGEAL MANOMETRY (EM);  Surgeon: Jerene Bears, MD;  Location: [facility] ENDOSCOPY;  Service: Gastroenterology;  Laterality: N/A;  . Laparoscopic nissen fundoplication N/A 4/31/5400    Procedure: LAPAROSCOPIC  LYSIS OF ADHESIONS, SEGMENTAL GASTRECTOMY, PARTIAL REDUCTION OF HERNIA;  Surgeon: [name], MD;  Location: [facility] ORS;  Service: General;  Laterality: N/A;    Social History   Social History  . Marital Status: Divorced    Spouse Name: N/A  . Number of Children: 3  . Years of Education: N/A   Occupational History  . Retired    Social History Main Topics  . Smoking status: Former Smoker -- 1.00 packs/day for 20 years    Types: Cigarettes    Start date: 10/12/1958    Quit date: 04/26/1988  . Smokeless tobacco: Never Used  . Alcohol  Use: No  . Drug Use: No  . Sexual Activity: No   Other Topics Concern  . Not on file   Social History Narrative     Filed Vitals:   01/09/15 0942  BP: 130/82  Pulse: 66  Height: 5\' 2"  (1.575 m)  Weight: 147 lb (66.679 kg)  SpO2: 99%    PHYSICAL EXAM General: NAD HEENT: Normal. Neck: No JVD, no thyromegaly. Lungs: Clear to auscultation bilaterally with normal respiratory effort. CV: Nondisplaced PMI.  Regular rate and rhythm, normal S1/S2, no S3/S4, no murmur. No pretibial or periankle edema.  No carotid bruit.  Normal pedal pulses.  Abdomen: Soft, nontender, no hepatosplenomegaly, no distention. No costovertebroangle  tenderness. Neurologic: Alert and oriented x 3.  Psych: Normal affect. Skin: Normal. Musculoskeletal: Normal range of motion, no gross deformities. Extremities: No clubbing or cyanosis.   ECG: Most recent ECG reviewed.      ASSESSMENT AND PLAN: 1. CAD s/p LAD stent: Stable ischemic heart disease. Continue ASA and statin. No beta blockers due to symptomatic bradycardia.   2. Essential HTN: Controlled on amlodipine 10 mg and losartan 100 mg. No changes.  3. Symptomatic bradycardia/Paroxysmal atrial fibrillation: Event monitor in 2014 did not reveal any tachy-brady arrhythmias.   4. Chronic diastolic dysfunction: Symptomatically stable, with no recent use of Lasix.  5. Paroxysmal atrial fibrillation: If she indeed has recurrences and we can document this, will need to consider anticoagulation.   6. Right flank pain: No tenderness, and denies dysuria and hematuria. May have represented a kidney stone. Encouraged to seek PCP assistance if symptoms recur.  Dispo: f/u 1 year.  Kate Sable, M.D., F.A.C.C.

## 2015-01-09 NOTE — Patient Instructions (Signed)
Continue all current medications. Your physician wants you to follow up in:  1 year.  You will receive a reminder letter in the mail one-two months in advance.  If you don't receive a letter, please call our office to schedule the follow up appointment   

## 2015-01-16 NOTE — Telephone Encounter (Signed)
Error

## 2015-02-10 ENCOUNTER — Encounter (HOSPITAL_COMMUNITY): Payer: Self-pay | Admitting: Emergency Medicine

## 2015-02-10 ENCOUNTER — Emergency Department (HOSPITAL_COMMUNITY): Payer: Medicare Other

## 2015-02-10 ENCOUNTER — Emergency Department (HOSPITAL_COMMUNITY)
Admission: EM | Admit: 2015-02-10 | Discharge: 2015-02-10 | Disposition: A | Discharge status: Home / Self Care | Payer: Medicare Other | Attending: Emergency Medicine | Admitting: Emergency Medicine

## 2015-02-10 DIAGNOSIS — I10 Essential (primary) hypertension: Secondary | ICD-10-CM | POA: Diagnosis not present

## 2015-02-10 DIAGNOSIS — F329 Major depressive disorder, single episode, unspecified: Secondary | ICD-10-CM | POA: Insufficient documentation

## 2015-02-10 DIAGNOSIS — K219 Gastro-esophageal reflux disease without esophagitis: Secondary | ICD-10-CM | POA: Insufficient documentation

## 2015-02-10 DIAGNOSIS — I2510 Atherosclerotic heart disease of native coronary artery without angina pectoris: Secondary | ICD-10-CM | POA: Diagnosis not present

## 2015-02-10 DIAGNOSIS — R52 Pain, unspecified: Secondary | ICD-10-CM

## 2015-02-10 DIAGNOSIS — R109 Unspecified abdominal pain: Secondary | ICD-10-CM | POA: Diagnosis present

## 2015-02-10 DIAGNOSIS — Z85820 Personal history of malignant melanoma of skin: Secondary | ICD-10-CM | POA: Insufficient documentation

## 2015-02-10 DIAGNOSIS — K449 Diaphragmatic hernia without obstruction or gangrene: Secondary | ICD-10-CM | POA: Insufficient documentation

## 2015-02-10 DIAGNOSIS — E7800 Pure hypercholesterolemia, unspecified: Secondary | ICD-10-CM | POA: Diagnosis not present

## 2015-02-10 DIAGNOSIS — F411 Generalized anxiety disorder: Secondary | ICD-10-CM | POA: Insufficient documentation

## 2015-02-10 DIAGNOSIS — I509 Heart failure, unspecified: Secondary | ICD-10-CM | POA: Diagnosis not present

## 2015-02-10 DIAGNOSIS — Z7982 Long term (current) use of aspirin: Secondary | ICD-10-CM | POA: Insufficient documentation

## 2015-02-10 DIAGNOSIS — Z87891 Personal history of nicotine dependence: Secondary | ICD-10-CM | POA: Diagnosis not present

## 2015-02-10 DIAGNOSIS — Z9861 Coronary angioplasty status: Secondary | ICD-10-CM | POA: Diagnosis not present

## 2015-02-10 DIAGNOSIS — Z79899 Other long term (current) drug therapy: Secondary | ICD-10-CM | POA: Insufficient documentation

## 2015-02-10 LAB — CBC WITH DIFFERENTIAL/PLATELET
Basophils Absolute: 0 10*3/uL (ref 0.0–0.1)
Basophils Relative: 0 %
EOS ABS: 0.1 10*3/uL (ref 0.0–0.7)
Eosinophils Relative: 2 %
HCT: 39.2 % (ref 36.0–46.0)
Hemoglobin: 12.5 g/dL (ref 12.0–15.0)
Lymphocytes Relative: 20 %
Lymphs Abs: 1.4 10*3/uL (ref 0.7–4.0)
MCH: 26.8 pg (ref 26.0–34.0)
MCHC: 31.9 g/dL (ref 30.0–36.0)
MCV: 84.1 fL (ref 78.0–100.0)
MONOS PCT: 8 %
Monocytes Absolute: 0.6 10*3/uL (ref 0.1–1.0)
NEUTROS PCT: 70 %
Neutro Abs: 4.9 10*3/uL (ref 1.7–7.7)
Platelets: 194 10*3/uL (ref 150–400)
RBC: 4.66 MIL/uL (ref 3.87–5.11)
RDW: 14.3 % (ref 11.5–15.5)
WBC: 7 10*3/uL (ref 4.0–10.5)

## 2015-02-10 LAB — URINALYSIS, ROUTINE W REFLEX MICROSCOPIC
Bilirubin Urine: NEGATIVE
Glucose, UA: NEGATIVE mg/dL
Hgb urine dipstick: NEGATIVE
KETONES UR: NEGATIVE mg/dL
Leukocytes, UA: NEGATIVE
NITRITE: NEGATIVE
PROTEIN: NEGATIVE mg/dL
Specific Gravity, Urine: 1.015 (ref 1.005–1.030)
UROBILINOGEN UA: 0.2 mg/dL (ref 0.0–1.0)
pH: 7.5 (ref 5.0–8.0)

## 2015-02-10 LAB — COMPREHENSIVE METABOLIC PANEL
ALK PHOS: 68 U/L (ref 38–126)
ALT: 17 U/L (ref 14–54)
ANION GAP: 10 (ref 5–15)
AST: 19 U/L (ref 15–41)
Albumin: 4.6 g/dL (ref 3.5–5.0)
BUN: 14 mg/dL (ref 6–20)
CO2: 26 mmol/L (ref 22–32)
Calcium: 9.7 mg/dL (ref 8.9–10.3)
Chloride: 103 mmol/L (ref 101–111)
Creatinine, Ser: 1.18 mg/dL — ABNORMAL HIGH (ref 0.44–1.00)
GFR, EST AFRICAN AMERICAN: 50 mL/min — AB (ref >60)
GFR, EST NON AFRICAN AMERICAN: 43 mL/min — AB (ref >60)
Glucose, Bld: 103 mg/dL — ABNORMAL HIGH (ref 65–99)
Potassium: 3.8 mmol/L (ref 3.5–5.1)
SODIUM: 139 mmol/L (ref 135–145)
Total Bilirubin: 0.4 mg/dL (ref 0.3–1.2)
Total Protein: 7.3 g/dL (ref 6.5–8.1)

## 2015-02-10 LAB — D-DIMER, QUANTITATIVE (NOT AT ARMC): D-Dimer, Quant: 0.29 ug/mL-FEU (ref 0.00–0.48)

## 2015-02-10 MED ORDER — GI COCKTAIL ~~LOC~~
30.0000 mL | Freq: Once | ORAL | Status: AC
  Administered 2015-02-10: 30 mL via ORAL
  Filled 2015-02-10: qty 30

## 2015-02-10 MED ORDER — HYDROMORPHONE HCL 1 MG/ML IJ SOLN
0.5000 mg | Freq: Once | INTRAMUSCULAR | Status: AC
  Administered 2015-02-10: 0.5 mg via INTRAVENOUS
  Filled 2015-02-10: qty 1

## 2015-02-10 MED ORDER — ONDANSETRON HCL 4 MG/2ML IJ SOLN
4.0000 mg | Freq: Once | INTRAMUSCULAR | Status: AC
  Administered 2015-02-10: 4 mg via INTRAVENOUS
  Filled 2015-02-10: qty 2

## 2015-02-10 MED ORDER — HYDROMORPHONE HCL 1 MG/ML IJ SOLN
1.0000 mg | Freq: Once | INTRAMUSCULAR | Status: AC
  Administered 2015-02-10: 1 mg via INTRAVENOUS
  Filled 2015-02-10: qty 1

## 2015-02-10 MED ORDER — PANTOPRAZOLE SODIUM 40 MG IV SOLR
40.0000 mg | Freq: Once | INTRAVENOUS | Status: AC
  Administered 2015-02-10: 40 mg via INTRAVENOUS
  Filled 2015-02-10: qty 40

## 2015-02-10 MED ORDER — IOHEXOL 350 MG/ML SOLN
75.0000 mL | Freq: Once | INTRAVENOUS | Status: AC | PRN
  Administered 2015-02-10: 75 mL via INTRAVENOUS

## 2015-02-10 MED ORDER — ONDANSETRON 4 MG PO TBDP: ORAL_TABLET | ORAL | Status: DC

## 2015-02-10 MED ORDER — OXYCODONE-ACETAMINOPHEN 5-325 MG PO TABS: 1.0000 | ORAL_TABLET | Freq: Four times a day (QID) | ORAL | Status: DC | PRN

## 2015-02-10 MED ORDER — ONDANSETRON HCL 4 MG/2ML IJ SOLN
INTRAMUSCULAR | Status: DC
Start: 2015-02-10 — End: 2015-02-11
  Filled 2015-02-10: qty 2

## 2015-02-10 MED ORDER — ONDANSETRON HCL 4 MG/2ML IJ SOLN
4.0000 mg | Freq: Once | INTRAMUSCULAR | Status: AC
  Administered 2015-02-10: 4 mg via INTRAVENOUS

## 2015-02-10 NOTE — Discharge Instructions (Signed)
Follow up with your gi md.  Return to the hospital this weekend if vomiting and worsening pain

## 2015-02-10 NOTE — ED Notes (Signed)
Pt sent by urgent care for possible kidney stone. Pt c/o RT sided flank pain, emesis, and urinary frequency.

## 2015-02-10 NOTE — ED Notes (Signed)
Medication given per order. Pt vomited after taking GI cocktail small amount of emesis.

## 2015-02-10 NOTE — ED Provider Notes (Signed)
CSN: [id number]     Arrival date & time 02/10/15  1727 History   First MD Initiated Contact with Patient 02/10/15 1737     Chief Complaint  Patient presents with  . Flank Pain     (Consider location/radiation/quality/duration/timing/severity/associated sxs/prior Treatment) Patient is a 80 y.o. female presenting with flank pain. The history is provided by the patient (The patient complains of right flank pain and vomiting today.).  Flank Pain This is a new problem. The current episode started 12 to 24 hours ago. The problem occurs constantly. The problem has not changed since onset.Pertinent negatives include no chest pain, no abdominal pain and no headaches. Nothing aggravates the symptoms. Nothing relieves the symptoms.    Past Medical History  Diagnosis Date  . Depressive disorder, not elsewhere classified   . Anxiety state, unspecified   . Pure hypercholesterolemia   . Esophageal reflux   . Infection of esophagostomy ([place])   . Congestive heart failure, unspecified     diastolic heart failure  . Pulmonary hypertension (HCC)     PA systolic pressure [id number] mmHg by echocardiogram, PA pressure 33/10 by cardiac catheterization PA saturation 62% thermodilution cardiac index 2.0 thick cardiac index 2.4  . Bradycardia     previously with coreg  . Tricuspid regurgitation     moderate tricuspid regurgitation by echocardiogram, prior use of anorexic agents  . Melanoma ([place]) 1995    removal on back   . Coronary artery disease     a. Moderate to severe coronary disease involving the left anterior descending artery and right coronary artery.  Sequential stenosis in the LAD is significant.  Right coronary artery is moderate to severely likely nonischemic. Questionable small LVOT obstruction.  No Brockenbrough maneuver was performed.  Catheterization January 2013; b. 05/2014 MV no isch/infarct, EF 60%.  Marland Kitchen PONV (postoperative nausea and vomiting)   . Hypertension   . H/O hiatal hernia   .  Gastritis   . [name]'s esophagus   . IBS (irritable bowel syndrome)   . Macular degeneration   . Dysrhythmia    Past Surgical History  Procedure Laterality Date  . Nissen fundoplication  [id number]  . Rotator cuff repair  ~ 2001    right  . Coronary angioplasty with stent placement  05/08/11    "1"  . Tonsillectomy  ~ 1944  . Shoulder arthroscopy  ~ 2004; 2005    left; "joint's wore out"  . Cataract extraction w/ intraocular lens  implant, bilateral  ~ 2002  . Tubal ligation  1972  . Percutaneous coronary stent intervention (pci-s) N/A 05/08/2011    Procedure: PERCUTANEOUS CORONARY STENT INTERVENTION (PCI-S);  Surgeon: Wellington Hampshire, MD;  Location[facility] CATH LAB;  Service: Cardiovascular;  Laterality: N/A;  . Esophageal manometry N/A 06/06/2014    Procedure: ESOPHAGEAL MANOMETRY (EM);  Surgeon: Jerene Bears, MD;  Location: [facility] ENDOSCOPY;  Service: Gastroenterology;  Laterality: N/A;  . Laparoscopic nissen fundoplication N/A [id number]    Procedure: LAPAROSCOPIC  LYSIS OF ADHESIONS, SEGMENTAL GASTRECTOMY, PARTIAL REDUCTION OF HERNIA;  Surgeon: [name], MD;  Location: [facility] ORS;  Service: General;  Laterality: N/A;  . Hernia repair     Family History  Problem Relation Age of Onset  . Heart attack Father   . Hypertension Father   . Diabetes Mother   . Hypertension Mother   . Colon cancer Neg Hx   . Esophageal cancer Neg Hx   . Rectal cancer Neg Hx   . Stomach cancer Neg Hx   .  Skin cancer Brother     melanoma   Social History  Substance Use Topics  . Smoking status: Former Smoker -- 1.00 packs/day for 20 years    Types: Cigarettes    Start date: 10/12/1958    Quit date: 04/26/1988  . Smokeless tobacco: Never Used  . Alcohol Use: No   OB History    No data available     Review of Systems  Constitutional: Negative for appetite change and fatigue.  HENT: Negative for congestion, ear discharge and sinus pressure.   Eyes: Negative for discharge.  Respiratory: Negative for  cough.   Cardiovascular: Negative for chest pain.  Gastrointestinal: Negative for abdominal pain and diarrhea.  Genitourinary: Positive for flank pain. Negative for frequency and hematuria.  Musculoskeletal: Negative for back pain.  Skin: Negative for rash.  Neurological: Negative for seizures and headaches.  Psychiatric/Behavioral: Negative for hallucinations.      Allergies  Sulfamethoxazole  Home Medications   Prior to Admission medications   Medication Sig Start Date End Date Taking? Authorizing Provider  albuterol (PROAIR HFA) 108 (90 BASE) MCG/ACT inhaler Inhale 2 puffs into the lungs every 6 (six) hours as needed for shortness of breath.    Yes Historical Provider, MD  alendronate (FOSAMAX) 70 MG tablet Take 70 mg by mouth every 7 (seven) days. Take with a full glass of water on an empty stomach on Mondays   Yes Historical Provider, MD  ALPRAZolam (XANAX) 0.25 MG tablet Take 0.25 mg by mouth 3 (three) times daily as needed for anxiety.    Yes Historical Provider, MD  amLODipine (NORVASC) 10 MG tablet Take 10 mg by mouth daily.   Yes Historical Provider, MD  aspirin 81 MG chewable tablet Chew 81 mg by mouth every evening.   Yes Historical Provider, MD  citalopram (CELEXA) 40 MG tablet Take 0.5 tablets (20 mg total) by mouth daily. Patient taking differently: Take 20 mg by mouth at bedtime.  09/12/12  Yes Nimish Luther Parody, MD  esomeprazole (NEXIUM) 40 MG capsule Take 20 mg by mouth daily before breakfast.    Yes Historical Provider, MD  ferrous sulfate 325 (65 FE) MG EC tablet Take 325 mg by mouth daily with breakfast.     Yes Historical Provider, MD  loratadine (CLARITIN) 10 MG tablet Take 10 mg by mouth daily.   Yes Historical Provider, MD  losartan (COZAAR) 100 MG tablet Take 1 tablet (100 mg total) by mouth daily. 11/11/12  Yes Herminio Commons, MD  Multiple Vitamins-Minerals (PRESERVISION AREDS) TABS Take 1 tablet by mouth 2 (two) times daily.    Yes Historical Provider, MD   Propylene Glycol (SYSTANE BALANCE OP) Place 1-2 drops into both eyes daily as needed (dry eyes).   Yes Historical Provider, MD  rosuvastatin (CRESTOR) 10 MG tablet Take 10 mg by mouth at bedtime.    Yes Historical Provider, MD  traZODone (DESYREL) 100 MG tablet Take 100 mg by mouth at bedtime.    Yes Historical Provider, MD  Vitamin D, Ergocalciferol, (DRISDOL) 50000 UNITS CAPS Take 50,000 Units by mouth every 7 (seven) days. Patient takes on Tuesdays   Yes Historical Provider, MD  zolpidem (AMBIEN) 10 MG tablet Take 10 mg by mouth at bedtime.     Yes Historical Provider, MD  ondansetron (ZOFRAN ODT) 4 MG disintegrating tablet 4mg  ODT q4 hours prn nausea/vomit 02/10/15   Milton Ferguson, MD  oxyCODONE-acetaminophen (PERCOCET/ROXICET) 5-325 MG tablet Take 1 tablet by mouth every 6 (six) hours as needed. 02/10/15  Milton Ferguson, MD   BP 166/77 mmHg  Pulse 83  Temp(Src) 98 F (36.7 C) (Oral)  Resp 15  Wt 148 lb (67.132 kg)  SpO2 98% Physical Exam  Constitutional: She is oriented to person, place, and time. She appears well-developed.  HENT:  Head: Normocephalic.  Eyes: Conjunctivae and EOM are normal. No scleral icterus.  Neck: Neck supple. No thyromegaly present.  Cardiovascular: Normal rate and regular rhythm.  Exam reveals no gallop and no friction rub.   No murmur heard. Pulmonary/Chest: No stridor. She has no wheezes. She has no rales. She exhibits no tenderness.  Abdominal: She exhibits no distension. There is no tenderness. There is no rebound.  Musculoskeletal: Normal range of motion. She exhibits no edema.  Lymphadenopathy:    She has no cervical adenopathy.  Neurological: She is oriented to person, place, and time. She exhibits normal muscle tone. Coordination normal.  Skin: No rash noted. No erythema.  Psychiatric: She has a normal mood and affect. Her behavior is normal.    ED Course  Procedures (including critical care time) Labs Review Labs Reviewed  COMPREHENSIVE  METABOLIC PANEL - Abnormal; Notable for the following:    Glucose, Bld 103 (*)    Creatinine, Ser 1.18 (*)    GFR calc non Af Amer 43 (*)    GFR calc Af Amer 50 (*)    All other components within normal limits  CBC WITH DIFFERENTIAL/PLATELET  URINALYSIS, ROUTINE W REFLEX MICROSCOPIC (NOT AT [facility])  D-DIMER, QUANTITATIVE (NOT AT [facility])    Imaging Review Dg Chest 2 View  02/10/2015  CLINICAL DATA:  Right flank pain with vomiting urinary frequency since this morning. History of melanoma and CHF and hypertension. EXAM: CHEST  2 VIEW COMPARISON:  05/07/2014 FINDINGS: Large hiatal hernia is similar to the prior study. Cardiac silhouette is mildly enlarged. No mediastinal or hilar masses or convincing adenopathy. Clear lungs.  No pleural effusion or pneumothorax. Bony thorax is demineralized but grossly intact. IMPRESSION: No acute cardiopulmonary disease. Electronically Signed   By: Lajean Manes M.D.   On: 02/10/2015 18:22   Ct Angio Chest Pe W/cm &/or Wo Cm  02/10/2015  CLINICAL DATA:  Nausea and vomiting and upper back pain. EXAM: CT ANGIOGRAPHY CHEST WITH CONTRAST TECHNIQUE: Multidetector CT imaging of the chest was performed using the standard protocol during bolus administration of intravenous contrast. Multiplanar CT image reconstructions and MIPs were obtained to evaluate the vascular anatomy. CONTRAST:  4mL OMNIPAQUE IOHEXOL 350 MG/ML SOLN COMPARISON:  Radiography same day FINDINGS: Pulmonary arterial opacification is excellent. There are no pulmonary emboli. The aorta shows atherosclerosis but there is no aneurysm or dissection. There is extensive coronary artery calcification. There is a very large hiatal hernia apparently filled with ingested material. There is not much within the intra abdominal portion of the gastric antrum. This raises the question/ potential for some degree of obstruction. The esophagus proximal to that is dilated and fluid and air-filled. The lung parenchyma shows  emphysema. There is compressive volume loss primarily in the right medial lower lung secondary to the large hiatal hernia. There is no pleural or pericardial fluid. Scans in the upper abdomen do not show any significant lesion. No significant bone finding. Review of the MIP images confirms the above findings. IMPRESSION: Large hiatal hernia filled with ingested material. There could possibly be relative outflow obstruction from this hernia given this appearance. No pulmonary emboli. Electronically Signed   By: Nelson Chimes M.D.   On: 02/10/2015 21:52  Ct Renal Stone Study  02/10/2015  CLINICAL DATA:  Right flank pain since 4 p.m. today. Urinary frequency. Nausea and vomiting. EXAM: CT ABDOMEN AND PELVIS WITHOUT CONTRAST TECHNIQUE: Multidetector CT imaging of the abdomen and pelvis was performed following the standard protocol without IV contrast. COMPARISON:  None. FINDINGS: Lung bases: Large hiatal hernia. There are bowel anastomosis staples along the anterior margin of the herniated stomach. Minor subsegmental atelectasis in the right lower lobe adjacent to the hernia. 3-4 mm nodule in the lateral left lung base, image 7, series 6. Heart normal in size. There are coronary artery calcifications. Liver, spleen, gallbladder, pancreas, adrenal glands:  Unremarkable. There are surgical vascular clips adjacent to the pancreatic tail. Kidneys, ureters, bladder: No ureteral stone or obstructive uropathy. No collecting system dilation. Single 3-4 mm nonobstructing stone in the lower pole the right kidney. 4 mm nonobstructing stone in the lower pole of the left kidney. Bilateral low-density renal masses consistent with cysts. Ureters normal course and in caliber. Bladder is mostly decompressed but otherwise unremarkable. Uterus and adnexa:  Unremarkable. Lymph nodes:  No adenopathy. Ascites:  None. Gastrointestinal: Cecum extends across midline to the left central abdomen. There are few left colon diverticula. No  diverticulitis. Colon otherwise unremarkable. Normal small bowel. Normal appendix visualized. Abdominal wall:  No significant hernia. Vascular: Atherosclerotic calcifications are noted along the aorta and its branch vessels. No aneurysm. Musculoskeletal: Disc and facet degenerative changes in the lower lumbar spine. No osteoblastic or osteolytic lesions. IMPRESSION: 1. No acute findings. 2. No evidence of a ureteral stone or obstructive uropathy. No findings to explain the patient's right flank pain. 3. There are single nonobstructing intrarenal stones in each kidney as well as bilateral renal cysts. 4. Scattered left colon diverticula without diverticulitis. 5. Normal appendix visualized. 6. Large hiatal hernia. Electronically Signed   By: Lajean Manes M.D.   On: 02/10/2015 18:21   I have personally reviewed and evaluated these images and lab results as part of my medical decision-making.   EKG Interpretation None      MDM   Final diagnoses:  Pain  Hiatal hernia    Labs were unremarkable on this [place]. CT of the abdomen did not show any ureteral kidney stones. CT the chest chest shows a large hiatal hernia. I suspect her symptoms were related to her hiatal hernia she's had surgery twice for this the last time the surgeon stated he was not able to fixate completely I offered the patient admission to the hospital for pain medicines and IV fluids and to see a GI doctor. She stated she would rather go home with pain and nausea medicine and follow-up with her GI doctor next week. She understands that she can return if she gets worse   The chart was scribed for me under my direct supervision.  I personally performed the history, physical, and medical decision making and all procedures in the evaluation of this patient.Milton Ferguson, MD 02/10/15 [phone]

## 2015-02-10 NOTE — ED Notes (Signed)
Discharge instructions given, pt demonstrated teach back and verbal understanding. No concerns voiced.  

## 2015-02-10 NOTE — ED Notes (Signed)
MD at bedside. 

## 2015-02-13 ENCOUNTER — Telehealth: Payer: Self-pay | Admitting: Internal Medicine

## 2015-02-13 NOTE — Telephone Encounter (Signed)
Pt states she was seen in the ER recently at [facility]. States she was having pain that started in her back and went around to her abdomen and she was very nauseated with the pain. Scans and labs were done but unrevealing. Pt requests OV for follow-up. Pt scheduled to see Alonza Bogus PA tomorrow at 2pm. Pt aware of appt.

## 2015-02-14 ENCOUNTER — Ambulatory Visit (INDEPENDENT_AMBULATORY_CARE_PROVIDER_SITE_OTHER): Payer: Medicare Other | Admitting: Gastroenterology

## 2015-02-14 ENCOUNTER — Telehealth: Payer: Self-pay | Admitting: *Deleted

## 2015-02-14 ENCOUNTER — Encounter: Payer: Self-pay | Admitting: Gastroenterology

## 2015-02-14 VITALS — BP 144/80 | HR 86 | Ht 62.0 in | Wt 147.8 lb

## 2015-02-14 DIAGNOSIS — K449 Diaphragmatic hernia without obstruction or gangrene: Secondary | ICD-10-CM

## 2015-02-14 DIAGNOSIS — R112 Nausea with vomiting, unspecified: Secondary | ICD-10-CM

## 2015-02-14 DIAGNOSIS — M5441 Lumbago with sciatica, right side: Secondary | ICD-10-CM | POA: Diagnosis not present

## 2015-02-14 NOTE — Progress Notes (Addendum)
     02/14/2015 Porfirio Oar Alexopoulos [id number] 1934/04/05   History of Present Illness:  This is a pleasant 80 year old female who is known to Dr. Hilarie Fredrickson. She has history of a large hiatal hernia. Upper endoscopy on 03/09/2014 showed a tortuous esophagus and a large 8 cm hiatal hernia with moderate gastritis, which was found to be reactive gastropathy after biopsy with no H. pylori or dysplasia present. Upper GI confirmed large hiatal hernia with the majority of the stomach located above the diaphragmatic hiatus. She was referred to Dr. Zella Richer at [facility] surgery and eventually underwent laparoscopic lysis of adhesions, segmental gastrectomy, and partial reduction of the hiatal hernia on 09/26/2014.  She presents for office today with complaints of episodes of severe right sided back pain associated with nausea and vomiting. These began at the beginning of October and she's had a few episodes since that time. In between she feels that the right side of her back is sore, but is otherwise asymptomatic. She denies any associated abdominal pain, however. She has Zofran that she takes at home for the nausea and vomiting, but says that her symptoms are extremely debilitating when these episodes are present.  Symptoms tend to occur after eating even if it just a small amount. She has not been eating much at all due to fear of developing symptoms.   Current Medications, Allergies, Past Medical History, Past Surgical History, Family History and Social History were reviewed in [employer] record.   Physical Exam: BP 144/80 mmHg  Pulse 86  Ht 5\' 2"  (1.575 m)  Wt 147 lb 12.8 oz (67.042 kg)  BMI 27.03 kg/m2  SpO2 98% General: Well developed white female in no acute distress Head: Normocephalic and atraumatic Eyes:  Sclerae anicteric, conjunctiva pink  Ears: Normal auditory acuity Lungs: Clear throughout to auscultation Heart: Regular rate and rhythm Abdomen: Soft,  non-distended.  Normal bowel sounds.  Non-tender. Musculoskeletal: Symmetrical with no gross deformities.  Slightly tender over right para-spinal muscles.  Extremities: No edema  Neurological: Alert oriented x 4, grossly non-focal Psychological:  Alert and cooperative. Normal mood and affect  Assessment and Recommendations: -Episodic nausea and vomiting along with right sided back pain in a patient with large hiatal hernia:  These episodes have only been present since her last surgery in June of this year. Unsure if this is related to her still present large hiatal hernia versus scar tissue , or if this could possibly be gallbladder dysfunction although she has no abdominal pain.  Symptoms very debilitating to patient when they occur.  Will check UGI series since there was question of possible relative outflow obstruction from the hernia on CT scan. We will also refer her back to see Dr. Zella Richer since this is a surgical issue whether it be due to her hiatal hernia, gallbladder disease, or adhesive disease.  Can use her zofran prn for now.  Addendum: Reviewed and agree with initial management. Jerene Bears, MD

## 2015-02-14 NOTE — Patient Instructions (Addendum)
You have been scheduled for an Upper GI test at [facility] (1st floor of hospital) on Friday 02-17-2015 at 10:30 am . Please arrive  At 10:15 minutes prior to your appointment for registration. Make certain not to have anything to eat or drink after midnight. Should you need to reschedule your appointment, please contact radiology at [phone]. This test typically takes about 30 minutes to perform.  We will call you with the appointment with Dr. Zella Richer at [facility].  We got you an appointment with Dr. Zella Richer at [place] for TU[place]day 02-21-2015 at 3:15 PM.

## 2015-02-14 NOTE — Telephone Encounter (Signed)
LM for the patient to advise her appointment with Dr. Zella Richer is on 02-21-2015 at 3:15 PM.

## 2015-02-17 ENCOUNTER — Ambulatory Visit (HOSPITAL_COMMUNITY)
Admission: RE | Admit: 2015-02-17 | Discharge: 2015-02-17 | Disposition: A | Discharge status: Home / Self Care | Payer: Medicare Other | Source: Ambulatory Visit | Attending: Gastroenterology | Admitting: Gastroenterology

## 2015-02-17 DIAGNOSIS — M5441 Lumbago with sciatica, right side: Secondary | ICD-10-CM | POA: Diagnosis not present

## 2015-02-17 DIAGNOSIS — R112 Nausea with vomiting, unspecified: Secondary | ICD-10-CM

## 2015-02-17 DIAGNOSIS — K449 Diaphragmatic hernia without obstruction or gangrene: Secondary | ICD-10-CM | POA: Diagnosis not present

## 2015-03-02 ENCOUNTER — Emergency Department (HOSPITAL_COMMUNITY): Payer: Medicare Other

## 2015-03-02 ENCOUNTER — Encounter (HOSPITAL_COMMUNITY): Payer: Self-pay

## 2015-03-02 ENCOUNTER — Inpatient Hospital Stay (HOSPITAL_COMMUNITY)
Admission: EM | Admit: 2015-03-02 | Discharge: 2015-03-14 | DRG: 330 | Disposition: A | Discharge status: Skilled Nursing Facility | Payer: Medicare Other | Attending: Internal Medicine | Admitting: Internal Medicine

## 2015-03-02 DIAGNOSIS — Z808 Family history of malignant neoplasm of other organs or systems: Secondary | ICD-10-CM

## 2015-03-02 DIAGNOSIS — K219 Gastro-esophageal reflux disease without esophagitis: Secondary | ICD-10-CM | POA: Diagnosis present

## 2015-03-02 DIAGNOSIS — N183 Chronic kidney disease, stage 3 unspecified: Secondary | ICD-10-CM | POA: Diagnosis present

## 2015-03-02 DIAGNOSIS — E872 Acidosis, unspecified: Secondary | ICD-10-CM | POA: Diagnosis present

## 2015-03-02 DIAGNOSIS — D649 Anemia, unspecified: Secondary | ICD-10-CM | POA: Diagnosis present

## 2015-03-02 DIAGNOSIS — N179 Acute kidney failure, unspecified: Secondary | ICD-10-CM | POA: Diagnosis present

## 2015-03-02 DIAGNOSIS — Z87891 Personal history of nicotine dependence: Secondary | ICD-10-CM

## 2015-03-02 DIAGNOSIS — I5032 Chronic diastolic (congestive) heart failure: Secondary | ICD-10-CM | POA: Diagnosis present

## 2015-03-02 DIAGNOSIS — Z452 Encounter for adjustment and management of vascular access device: Secondary | ICD-10-CM

## 2015-03-02 DIAGNOSIS — R1032 Left lower quadrant pain: Secondary | ICD-10-CM

## 2015-03-02 DIAGNOSIS — K922 Gastrointestinal hemorrhage, unspecified: Secondary | ICD-10-CM

## 2015-03-02 DIAGNOSIS — K631 Perforation of intestine (nontraumatic): Secondary | ICD-10-CM

## 2015-03-02 DIAGNOSIS — R9431 Abnormal electrocardiogram [ECG] [EKG]: Secondary | ICD-10-CM | POA: Diagnosis present

## 2015-03-02 DIAGNOSIS — Z833 Family history of diabetes mellitus: Secondary | ICD-10-CM

## 2015-03-02 DIAGNOSIS — E7800 Pure hypercholesterolemia, unspecified: Secondary | ICD-10-CM | POA: Diagnosis present

## 2015-03-02 DIAGNOSIS — H3530 Unspecified macular degeneration: Secondary | ICD-10-CM | POA: Diagnosis present

## 2015-03-02 DIAGNOSIS — E785 Hyperlipidemia, unspecified: Secondary | ICD-10-CM | POA: Diagnosis present

## 2015-03-02 DIAGNOSIS — I10 Essential (primary) hypertension: Secondary | ICD-10-CM | POA: Diagnosis present

## 2015-03-02 DIAGNOSIS — I4892 Unspecified atrial flutter: Secondary | ICD-10-CM | POA: Diagnosis present

## 2015-03-02 DIAGNOSIS — I130 Hypertensive heart and chronic kidney disease with heart failure and stage 1 through stage 4 chronic kidney disease, or unspecified chronic kidney disease: Secondary | ICD-10-CM | POA: Diagnosis present

## 2015-03-02 DIAGNOSIS — E876 Hypokalemia: Secondary | ICD-10-CM | POA: Diagnosis present

## 2015-03-02 DIAGNOSIS — Z85820 Personal history of malignant melanoma of skin: Secondary | ICD-10-CM

## 2015-03-02 DIAGNOSIS — Z95828 Presence of other vascular implants and grafts: Secondary | ICD-10-CM

## 2015-03-02 DIAGNOSIS — F411 Generalized anxiety disorder: Secondary | ICD-10-CM | POA: Diagnosis present

## 2015-03-02 DIAGNOSIS — L0291 Cutaneous abscess, unspecified: Secondary | ICD-10-CM

## 2015-03-02 DIAGNOSIS — D72829 Elevated white blood cell count, unspecified: Secondary | ICD-10-CM

## 2015-03-02 DIAGNOSIS — I2510 Atherosclerotic heart disease of native coronary artery without angina pectoris: Secondary | ICD-10-CM | POA: Diagnosis present

## 2015-03-02 DIAGNOSIS — R109 Unspecified abdominal pain: Secondary | ICD-10-CM | POA: Diagnosis present

## 2015-03-02 DIAGNOSIS — Z8249 Family history of ischemic heart disease and other diseases of the circulatory system: Secondary | ICD-10-CM

## 2015-03-02 DIAGNOSIS — I519 Heart disease, unspecified: Secondary | ICD-10-CM | POA: Diagnosis present

## 2015-03-02 DIAGNOSIS — Z955 Presence of coronary angioplasty implant and graft: Secondary | ICD-10-CM

## 2015-03-02 DIAGNOSIS — I480 Paroxysmal atrial fibrillation: Secondary | ICD-10-CM | POA: Diagnosis present

## 2015-03-02 DIAGNOSIS — R0902 Hypoxemia: Secondary | ICD-10-CM

## 2015-03-02 DIAGNOSIS — I272 Other secondary pulmonary hypertension: Secondary | ICD-10-CM | POA: Diagnosis present

## 2015-03-02 DIAGNOSIS — E878 Other disorders of electrolyte and fluid balance, not elsewhere classified: Secondary | ICD-10-CM | POA: Diagnosis present

## 2015-03-02 LAB — URINALYSIS, ROUTINE W REFLEX MICROSCOPIC
BILIRUBIN URINE: NEGATIVE
Glucose, UA: NEGATIVE mg/dL
KETONES UR: 15 mg/dL — AB
LEUKOCYTES UA: NEGATIVE
NITRITE: NEGATIVE
Protein, ur: NEGATIVE mg/dL
SPECIFIC GRAVITY, URINE: 1.01 (ref 1.005–1.030)
pH: 6.5 (ref 5.0–8.0)

## 2015-03-02 LAB — CBC WITH DIFFERENTIAL/PLATELET
Basophils Absolute: 0 10*3/uL (ref 0.0–0.1)
Basophils Relative: 0 %
EOS ABS: 0.1 10*3/uL (ref 0.0–0.7)
EOS PCT: 1 %
HCT: 38.7 % (ref 36.0–46.0)
Hemoglobin: 12.8 g/dL (ref 12.0–15.0)
LYMPHS ABS: 1.6 10*3/uL (ref 0.7–4.0)
LYMPHS PCT: 12 %
MCH: 27.2 pg (ref 26.0–34.0)
MCHC: 33.1 g/dL (ref 30.0–36.0)
MCV: 82.2 fL (ref 78.0–100.0)
MONO ABS: 0.6 10*3/uL (ref 0.1–1.0)
MONOS PCT: 5 %
Neutro Abs: 10.8 10*3/uL — ABNORMAL HIGH (ref 1.7–7.7)
Neutrophils Relative %: 82 %
PLATELETS: 189 10*3/uL (ref 150–400)
RBC: 4.71 MIL/uL (ref 3.87–5.11)
RDW: 13.9 % (ref 11.5–15.5)
WBC: 13.2 10*3/uL — ABNORMAL HIGH (ref 4.0–10.5)

## 2015-03-02 LAB — COMPREHENSIVE METABOLIC PANEL
ALT: 14 U/L (ref 14–54)
ANION GAP: 14 (ref 5–15)
AST: 21 U/L (ref 15–41)
Albumin: 4.5 g/dL (ref 3.5–5.0)
Alkaline Phosphatase: 59 U/L (ref 38–126)
BUN: 17 mg/dL (ref 6–20)
CHLORIDE: 102 mmol/L (ref 101–111)
CO2: 21 mmol/L — ABNORMAL LOW (ref 22–32)
CREATININE: 1.29 mg/dL — AB (ref 0.44–1.00)
Calcium: 9.3 mg/dL (ref 8.9–10.3)
GFR, EST AFRICAN AMERICAN: 45 mL/min — AB (ref >60)
GFR, EST NON AFRICAN AMERICAN: 39 mL/min — AB (ref >60)
Glucose, Bld: 121 mg/dL — ABNORMAL HIGH (ref 65–99)
Potassium: 3.3 mmol/L — ABNORMAL LOW (ref 3.5–5.1)
SODIUM: 137 mmol/L (ref 135–145)
Total Bilirubin: 0.9 mg/dL (ref 0.3–1.2)
Total Protein: 7.1 g/dL (ref 6.5–8.1)

## 2015-03-02 LAB — URINE MICROSCOPIC-ADD ON

## 2015-03-02 LAB — I-STAT CG4 LACTIC ACID, ED: LACTIC ACID, VENOUS: 4.01 mmol/L — AB (ref 0.5–2.0)

## 2015-03-02 LAB — PROTIME-INR
INR: 1.07 (ref 0.00–1.49)
Prothrombin Time: 14.1 seconds (ref 11.6–15.2)

## 2015-03-02 LAB — POC OCCULT BLOOD, ED: Fecal Occult Bld: POSITIVE — AB

## 2015-03-02 LAB — TROPONIN I: Troponin I: <0.03 ng/mL (ref <0.031)

## 2015-03-02 LAB — LIPASE, BLOOD: LIPASE: 24 U/L (ref 11–51)

## 2015-03-02 MED ORDER — ONDANSETRON HCL 4 MG/2ML IJ SOLN
4.0000 mg | Freq: Once | INTRAMUSCULAR | Status: AC
  Administered 2015-03-02: 4 mg via INTRAVENOUS
  Filled 2015-03-02: qty 2

## 2015-03-02 MED ORDER — HYDROMORPHONE HCL 1 MG/ML IJ SOLN
INTRAMUSCULAR | Status: AC
  Administered 2015-03-02: 1 mg
  Filled 2015-03-02: qty 1

## 2015-03-02 MED ORDER — HYDROMORPHONE HCL 1 MG/ML IJ SOLN
1.0000 mg | Freq: Once | INTRAMUSCULAR | Status: AC
  Administered 2015-03-02: 1 mg via INTRAVENOUS
  Filled 2015-03-02: qty 1

## 2015-03-02 MED ORDER — FENTANYL CITRATE (PF) 100 MCG/2ML IJ SOLN
25.0000 ug | Freq: Once | INTRAMUSCULAR | Status: AC
  Administered 2015-03-02: 25 ug via INTRAVENOUS
  Filled 2015-03-02: qty 2

## 2015-03-02 MED ORDER — PROMETHAZINE HCL 25 MG/ML IJ SOLN
12.5000 mg | Freq: Once | INTRAMUSCULAR | Status: AC
  Administered 2015-03-02: 12.5 mg via INTRAVENOUS
  Filled 2015-03-02: qty 1

## 2015-03-02 MED ORDER — HYDROMORPHONE HCL 1 MG/ML IJ SOLN: 1.0000 mg | Freq: Once | INTRAMUSCULAR | Status: AC

## 2015-03-02 MED ORDER — SODIUM CHLORIDE 0.9 % IV BOLUS (SEPSIS)
250.0000 mL | Freq: Once | INTRAVENOUS | Status: AC
  Administered 2015-03-02: 250 mL via INTRAVENOUS

## 2015-03-02 MED ORDER — PANTOPRAZOLE SODIUM 40 MG IV SOLR
40.0000 mg | Freq: Once | INTRAVENOUS | Status: AC
  Administered 2015-03-02: 40 mg via INTRAVENOUS
  Filled 2015-03-02: qty 40

## 2015-03-02 MED ORDER — ONDANSETRON HCL 4 MG/2ML IJ SOLN
4.0000 mg | Freq: Once | INTRAMUSCULAR | Status: DC
  Filled 2015-03-02: qty 2

## 2015-03-02 MED ORDER — SODIUM CHLORIDE 0.9 % IV SOLN
INTRAVENOUS | Status: DC
  Administered 2015-03-02: via INTRAVENOUS

## 2015-03-02 NOTE — ED Provider Notes (Addendum)
CSN: [id number]     Arrival date & time 03/02/15  2032 History  By signing my name below, I, Ahley Aguillon, attest that this documentation has been prepared under the direction and in the presence of Fredia Sorrow, MD. Electronically Signed: Jolayne Panther, Scribe. 03/02/2015. 10:42 PM.    Chief Complaint  Patient presents with  . Abdominal Pain    The history is provided by the patient. No language interpreter was used.    HPI Comments: [name] is a 80 y.o. female with a h/o abdominal surgery who presents to the Emergency Department complaining of 10/10 sudden onset intermittent abdominal pain in her LLQ onset about 5:00 PM today. Pt has had three bouts of this abdominal pain so far today. She also reports associated nausea, dry heave vomiting, dark maroon bloody stool and diarrhea. Pt also complains of congestion. She reports the last time she ate was 3:00 pm today when she ate soup. She reports that she has never had blood in her bowel movements prior to this. Pt also had similar symptoms in the middle of the night two weeks before the 11th of November. Pt reports she was supposed to be set up for an ultra sounds which has not yet happened. Pt's most recent abdominal surgery was in June of this past year when a laparoscopic nissen fundoplication and hernia repair were performed. She denies fever, chills, visual disturbance, HA, chest pain, SOB, leg swelling, back pain, dysuria, hematuria and back pain.  Past Medical History  Diagnosis Date  . Depressive disorder, not elsewhere classified   . Anxiety state, unspecified   . Pure hypercholesterolemia   . Esophageal reflux   . Infection of esophagostomy ([place])   . Congestive heart failure, unspecified     diastolic heart failure  . Pulmonary hypertension (HCC)     PA systolic pressure [id number] mmHg by echocardiogram, PA pressure 33/10 by cardiac catheterization PA saturation 62% thermodilution cardiac index 2.0 thick cardiac  index 2.4  . Bradycardia     previously with coreg  . Tricuspid regurgitation     moderate tricuspid regurgitation by echocardiogram, prior use of anorexic agents  . Melanoma ([place]) 1995    removal on back   . Coronary artery disease     a. Moderate to severe coronary disease involving the left anterior descending artery and right coronary artery.  Sequential stenosis in the LAD is significant.  Right coronary artery is moderate to severely likely nonischemic. Questionable small LVOT obstruction.  No Brockenbrough maneuver was performed.  Catheterization January 2013; b. 05/2014 MV no isch/infarct, EF 60%.  Marland Kitchen PONV (postoperative nausea and vomiting)   . Hypertension   . H/O hiatal hernia   . Gastritis   . [name]'s esophagus   . IBS (irritable bowel syndrome)   . Macular degeneration   . Dysrhythmia    Past Surgical History  Procedure Laterality Date  . Nissen fundoplication  [id number]  . Rotator cuff repair  ~ 2001    right  . Coronary angioplasty with stent placement  05/08/11    "1"  . Tonsillectomy  ~ 1944  . Shoulder arthroscopy  ~ 2004; 2005    left; "joint's wore out"  . Cataract extraction w/ intraocular lens  implant, bilateral  ~ 2002  . Tubal ligation  1972  . Percutaneous coronary stent intervention (pci-s) N/A 05/08/2011    Procedure: PERCUTANEOUS CORONARY STENT INTERVENTION (PCI-S);  Surgeon: Wellington Hampshire, MD;  Location: [facility] CATH LAB;  Service:  Cardiovascular;  Laterality: N/A;  . Esophageal manometry N/A 06/06/2014    Procedure: ESOPHAGEAL MANOMETRY (EM);  Surgeon: Jerene Bears, MD;  Location: [facility] ENDOSCOPY;  Service: Gastroenterology;  Laterality: N/A;  . Laparoscopic nissen fundoplication N/A [id number]    Procedure: LAPAROSCOPIC  LYSIS OF ADHESIONS, SEGMENTAL GASTRECTOMY, PARTIAL REDUCTION OF HERNIA;  Surgeon: [name], MD;  Location: [facility] ORS;  Service: General;  Laterality: N/A;  . Hernia repair     Family History  Problem Relation Age of Onset  . Heart attack  Father   . Hypertension Father   . Diabetes Mother   . Hypertension Mother   . Colon cancer Neg Hx   . Esophageal cancer Neg Hx   . Rectal cancer Neg Hx   . Stomach cancer Neg Hx   . Skin cancer Brother     melanoma   Social History  Substance Use Topics  . Smoking status: Former Smoker -- 1.00 packs/day for 20 years    Types: Cigarettes    Start date: 10/12/1958    Quit date: 04/26/1988  . Smokeless tobacco: Never Used  . Alcohol Use: No   OB History    No data available     Review of Systems  Constitutional: Negative for fever and chills.  HENT: Positive for congestion. Negative for sore throat.   Eyes: Negative for visual disturbance.  Respiratory: Negative for cough and shortness of breath.   Cardiovascular: Negative for chest pain and leg swelling.  Gastrointestinal: Positive for nausea, vomiting, abdominal pain and diarrhea ( bloody stools ).  Genitourinary: Negative for dysuria and hematuria.  Musculoskeletal: Negative for back pain.  Skin: Negative for rash.  Neurological: Negative for headaches.  Hematological: Does not bruise/bleed easily.  Psychiatric/Behavioral: Negative for confusion.      Allergies  Sulfamethoxazole  Home Medications   Prior to Admission medications   Medication Sig Start Date End Date Taking? Authorizing Provider  albuterol (PROAIR HFA) 108 (90 BASE) MCG/ACT inhaler Inhale 2 puffs into the lungs every 6 (six) hours as needed for shortness of breath.    Yes Historical Provider, MD  alendronate (FOSAMAX) 70 MG tablet Take 70 mg by mouth every 7 (seven) days. Take with a full glass of water on an empty stomach on Mondays   Yes Historical Provider, MD  ALPRAZolam (XANAX) 0.25 MG tablet Take 0.25 mg by mouth 3 (three) times daily as needed for anxiety.    Yes Historical Provider, MD  aspirin 81 MG chewable tablet Chew 81 mg by mouth every evening.   Yes Historical Provider, MD  citalopram (CELEXA) 40 MG tablet Take 0.5 tablets (20 mg  total) by mouth daily. Patient taking differently: Take 20 mg by mouth at bedtime.  09/12/12  Yes Nimish Luther Parody, MD  esomeprazole (NEXIUM) 40 MG capsule Take 20 mg by mouth daily before breakfast.    Yes Historical Provider, MD  ferrous sulfate 325 (65 FE) MG EC tablet Take 325 mg by mouth daily with breakfast.     Yes Historical Provider, MD  ibuprofen (ADVIL,MOTRIN) 200 MG tablet Take 200 mg by mouth every 6 (six) hours as needed for moderate pain.   Yes Historical Provider, MD  loratadine (CLARITIN) 10 MG tablet Take 10 mg by mouth daily.   Yes Historical Provider, MD  losartan (COZAAR) 100 MG tablet Take 1 tablet (100 mg total) by mouth daily. 11/11/12  Yes Herminio Commons, MD  Multiple Vitamins-Minerals (PRESERVISION AREDS) TABS Take 1 tablet by mouth 2 (two) times  daily.    Yes Historical Provider, MD  nitroGLYCERIN (NITROSTAT) 0.4 MG SL tablet Place 0.4 mg under the tongue every 5 (five) minutes as needed for chest pain.   Yes Historical Provider, MD  ondansetron (ZOFRAN ODT) 4 MG disintegrating tablet 4mg  ODT q4 hours prn nausea/vomit Patient taking differently: Take 4 mg by mouth every 4 (four) hours as needed for nausea or vomiting. 4mg  ODT q4 hours prn nausea/vomit 02/10/15  Yes Milton Ferguson, MD  Propylene Glycol (SYSTANE BALANCE OP) Place 1-2 drops into both eyes daily as needed (dry eyes).   Yes Historical Provider, MD  rosuvastatin (CRESTOR) 10 MG tablet Take 10 mg by mouth at bedtime.    Yes Historical Provider, MD  traZODone (DESYREL) 100 MG tablet Take 100 mg by mouth at bedtime.    Yes Historical Provider, MD  Vitamin D, Ergocalciferol, (DRISDOL) 50000 UNITS CAPS Take 50,000 Units by mouth every 7 (seven) days. Patient takes on Tuesdays   Yes Historical Provider, MD  zolpidem (AMBIEN) 10 MG tablet Take 10 mg by mouth at bedtime.     Yes Historical Provider, MD  oxyCODONE-acetaminophen (PERCOCET/ROXICET) 5-325 MG tablet Take 1 tablet by mouth every 6 (six) hours as  needed. Patient not taking: Reported on 03/02/2015 02/10/15   Milton Ferguson, MD   BP 180/60 mmHg  Pulse 88  Temp(Src) 98 F (36.7 C) (Oral)  Resp 14  Ht 5\' 2"  (1.575 m)  Wt 67.132 kg  BMI 27.06 kg/m2  SpO2 99% Physical Exam  Constitutional: She is oriented to person, place, and time. She appears well-developed and well-nourished. No distress.  HENT:  Head: Normocephalic.  Mouth/Throat: Oropharynx is clear and moist.  Eyes: Conjunctivae and EOM are normal. Pupils are equal, round, and reactive to light. No scleral icterus.  Cardiovascular: Regular rhythm.   Tachycardic   Pulmonary/Chest: Effort normal and breath sounds normal.  Lungs clear bilaterally   Abdominal: Soft. She exhibits no distension. There is no tenderness.  Bowel sounds decreased   Musculoskeletal: She exhibits no edema.  Moving normally all over her extremities  Neurological: She is alert and oriented to person, place, and time. No cranial nerve deficit. She exhibits normal muscle tone. Coordination normal.  Skin: Skin is warm and dry.  Psychiatric: She has a normal mood and affect.  Nursing note and vitals reviewed.   ED Course  Procedures  DIAGNOSTIC STUDIES:    Oxygen Saturation is 100% on RA, normal by my interpretation.   COORDINATION OF CARE:  11:53 PM Will perform a CT scan of the abdomen. Will administer pain medication in the ED. Discussed treatment plan with pt at bedside and pt agreed to plan.     Labs Review Labs Reviewed  CBC WITH DIFFERENTIAL/PLATELET - Abnormal; Notable for the following:    WBC 13.2 (*)    Neutro Abs 10.8 (*)    All other components within normal limits  COMPREHENSIVE METABOLIC PANEL - Abnormal; Notable for the following:    Potassium 3.3 (*)    CO2 21 (*)    Glucose, Bld 121 (*)    Creatinine, Ser 1.29 (*)    GFR calc non Af Amer 39 (*)    GFR calc Af Amer 45 (*)    All other components within normal limits  URINALYSIS, ROUTINE W REFLEX MICROSCOPIC (NOT AT  [facility]) - Abnormal; Notable for the following:    Hgb urine dipstick LARGE (*)    Ketones, ur 15 (*)    All other components within normal  limits  URINE MICROSCOPIC-ADD ON - Abnormal; Notable for the following:    Squamous Epithelial / LPF 0-5 (*)    Bacteria, UA FEW (*)    All other components within normal limits  I-STAT CG4 LACTIC ACID, ED - Abnormal; Notable for the following:    Lactic Acid, Venous 4.01 (*)    All other components within normal limits  POC OCCULT BLOOD, ED - Abnormal; Notable for the following:    Fecal Occult Bld POSITIVE (*)    All other components within normal limits  CULTURE, BLOOD (ROUTINE X 2)  CULTURE, BLOOD (ROUTINE X 2)  LIPASE, BLOOD  PROTIME-INR  TROPONIN I  HEMOGLOBIN AND HEMATOCRIT, BLOOD  TYPE AND SCREEN   Results for orders placed or performed during the hospital encounter of 03/02/15  CBC with Differential  Result Value Ref Range   WBC 13.2 (H) 4.0 - 10.5 K/uL   RBC 4.71 3.87 - 5.11 MIL/uL   Hemoglobin 12.8 12.0 - 15.0 g/dL   HCT 38.7 36.0 - 46.0 %   MCV 82.2 78.0 - 100.0 fL   MCH 27.2 26.0 - 34.0 pg   MCHC 33.1 30.0 - 36.0 g/dL   RDW 13.9 11.5 - 15.5 %   Platelets 189 150 - 400 K/uL   Neutrophils Relative % 82 %   Neutro Abs 10.8 (H) 1.7 - 7.7 K/uL   Lymphocytes Relative 12 %   Lymphs Abs 1.6 0.7 - 4.0 K/uL   Monocytes Relative 5 %   Monocytes Absolute 0.6 0.1 - 1.0 K/uL   Eosinophils Relative 1 %   Eosinophils Absolute 0.1 0.0 - 0.7 K/uL   Basophils Relative 0 %   Basophils Absolute 0.0 0.0 - 0.1 K/uL  Comprehensive metabolic panel  Result Value Ref Range   Sodium 137 135 - 145 mmol/L   Potassium 3.3 (L) 3.5 - 5.1 mmol/L   Chloride 102 101 - 111 mmol/L   CO2 21 (L) 22 - 32 mmol/L   Glucose, Bld 121 (H) 65 - 99 mg/dL   BUN 17 6 - 20 mg/dL   Creatinine, Ser 1.29 (H) 0.44 - 1.00 mg/dL   Calcium 9.3 8.9 - 10.3 mg/dL   Total Protein 7.1 6.5 - 8.1 g/dL   Albumin 4.5 3.5 - 5.0 g/dL   AST 21 15 - 41 U/L   ALT 14 14 - 54 U/L    Alkaline Phosphatase 59 38 - 126 U/L   Total Bilirubin 0.9 0.3 - 1.2 mg/dL   GFR calc non Af Amer 39 (L) >60 mL/min   GFR calc Af Amer 45 (L) >60 mL/min   Anion gap 14 5 - 15  Urinalysis, Routine w reflex microscopic (not at [facility])  Result Value Ref Range   Color, Urine YELLOW YELLOW   APPearance CLEAR CLEAR   Specific Gravity, Urine 1.010 1.005 - 1.030   pH 6.5 5.0 - 8.0   Glucose, UA NEGATIVE NEGATIVE mg/dL   Hgb urine dipstick LARGE (A) NEGATIVE   Bilirubin Urine NEGATIVE NEGATIVE   Ketones, ur 15 (A) NEGATIVE mg/dL   Protein, ur NEGATIVE NEGATIVE mg/dL   Nitrite NEGATIVE NEGATIVE   Leukocytes, UA NEGATIVE NEGATIVE  Lipase, blood  Result Value Ref Range   Lipase 24 11 - 51 U/L  Protime-INR  Result Value Ref Range   Prothrombin Time 14.1 11.6 - 15.2 seconds   INR 1.07 0.00 - 1.49  Troponin I  Result Value Ref Range   Troponin I <0.03 <0.031 ng/mL  Urine microscopic-add  on  Result Value Ref Range   Squamous Epithelial / LPF 0-5 (A) NONE SEEN   WBC, UA 0-5 0 - 5 WBC/hpf   RBC / HPF 6-30 0 - 5 RBC/hpf   Bacteria, UA FEW (A) NONE SEEN  I-Stat CG4 Lactic Acid, ED  Result Value Ref Range   Lactic Acid, Venous 4.01 (HH) 0.5 - 2.0 mmol/L   Comment NOTIFIED PHYSICIAN   POC occult blood, ED RN will collect  Result Value Ref Range   Fecal Occult Bld POSITIVE (A) NEGATIVE     Imaging Review Dg Abd Acute W/chest  03/02/2015  CLINICAL DATA:  Left lower quadrant pain. Bloody diarrhea. Shortness of breath. EXAM: DG ABDOMEN ACUTE W/ 1V CHEST COMPARISON:  None. FINDINGS: There is no evidence of dilated bowel loops or free intraperitoneal air. Large amount of stool throughout the colon. No radiopaque calculi or other significant radiographic abnormality is seen. Heart size and mediastinal contours are within normal limits. Both lungs are clear. IMPRESSION: Large amount of stool throughout the colon. No acute cardiopulmonary disease. Electronically Signed   By: Kathreen Devoid   On:  03/02/2015 23:15   I have personally reviewed and evaluated these images and lab results as part of my medical decision-making.   EKG Interpretation   Date/Time:  Thursday March 02 2015 21:57:41 EST Ventricular Rate:  93 PR Interval:  156 QRS Duration: 98 QT Interval:  407 QTC Calculation: 506 R Axis:   -17 Text Interpretation:  Sinus rhythm Multiple ventricular premature  complexes Borderline left axis deviation Minimal ST depression, lateral  leads Prolonged QT interval inferior ST depression Confirmed by Wyvonnia Dusky   MD, STEPHEN [phone]) on 03/02/2015 10:01:53 PM Also confirmed by Rogene Houston   MD, Vlada Uriostegui [id number])  on 03/02/2015 10:44:42 PM      MDM   Final diagnoses:  Left lower quadrant pain  Gastrointestinal hemorrhage, unspecified gastritis, unspecified gastrointestinal hemorrhage type    Patient with acute onset around 5:00 of left-sided abdominal pain. Episodic in nature but severe pain when it occurs. Associated with nausea and dry heaves. Associated with loose bowel movements that have had dark blood in them. Patient's had 3 other episodes of pain similar to this that were episodic lasting about a day or day and a half and then resolving workups in the past have been negative. This pain is similar is in a different location however.  Patient's stool is maroon in color and is heme positive. Patient without significant tachycardia no hypotension actually hypertension. Patient's hemoglobin and hematocrit currently stable. Patient will require admission just for the GI bleed. However she need CT abdomen with contrast to further evaluate this abdominal pain. Clinically it seems like it could be consistent with ischemic bowel patient did not eat anything right prior to that happening and the other episodes seem not to be associated with food as well.  Lactic acid was elevated electrolytes without significant abnormalities other than CO2 being 21. Patient's oxygen saturations of the  defined. Plain films of the abdomen showed no free air or large amount of stool nothing consistent with a bowel obstruction.  Patient will be turned over to the overnight emergency physician for CT results. Patient will require admission. We'll repeat her hemoglobin and hematocrit. Patient's picture does not seem to be consistent with sepsis at this time lactic acid could as stated be due to ischemic bowel.  I personally performed the services described in this documentation, which was scribed in my presence. The recorded information has been  reviewed and is accurate.      Fredia Sorrow, MD 03/02/15 2356  Fredia Sorrow, MD 03/02/15 2356  Patient feeling better with pain medicine and antinausea medicine. Patient able to drink her contrast for the CT scan.  Fredia Sorrow, MD 03/03/15 0025

## 2015-03-02 NOTE — ED Notes (Signed)
Pt states she "cant drink contrast for CT", due to emesis. Advised pt that she needed to try and get contrast down due to need for scan. Pt again stated she couldn't. Updated EDP, he spoke to pt, ordered phenergan and pain medication.

## 2015-03-02 NOTE — ED Notes (Signed)
Pt reports generalized abd pain for several hours, states has been vomiting, no diarrhea

## 2015-03-03 ENCOUNTER — Encounter (HOSPITAL_COMMUNITY): Payer: Self-pay | Admitting: *Deleted

## 2015-03-03 DIAGNOSIS — D649 Anemia, unspecified: Secondary | ICD-10-CM | POA: Diagnosis present

## 2015-03-03 DIAGNOSIS — I5032 Chronic diastolic (congestive) heart failure: Secondary | ICD-10-CM | POA: Diagnosis present

## 2015-03-03 DIAGNOSIS — Z8249 Family history of ischemic heart disease and other diseases of the circulatory system: Secondary | ICD-10-CM | POA: Diagnosis not present

## 2015-03-03 DIAGNOSIS — E876 Hypokalemia: Secondary | ICD-10-CM | POA: Diagnosis present

## 2015-03-03 DIAGNOSIS — Z833 Family history of diabetes mellitus: Secondary | ICD-10-CM | POA: Diagnosis not present

## 2015-03-03 DIAGNOSIS — E785 Hyperlipidemia, unspecified: Secondary | ICD-10-CM | POA: Diagnosis present

## 2015-03-03 DIAGNOSIS — I2510 Atherosclerotic heart disease of native coronary artery without angina pectoris: Secondary | ICD-10-CM | POA: Diagnosis present

## 2015-03-03 DIAGNOSIS — E872 Acidosis, unspecified: Secondary | ICD-10-CM | POA: Diagnosis present

## 2015-03-03 DIAGNOSIS — R1032 Left lower quadrant pain: Secondary | ICD-10-CM | POA: Diagnosis present

## 2015-03-03 DIAGNOSIS — I480 Paroxysmal atrial fibrillation: Secondary | ICD-10-CM | POA: Diagnosis present

## 2015-03-03 DIAGNOSIS — Z87891 Personal history of nicotine dependence: Secondary | ICD-10-CM | POA: Diagnosis not present

## 2015-03-03 DIAGNOSIS — N183 Chronic kidney disease, stage 3 unspecified: Secondary | ICD-10-CM | POA: Diagnosis present

## 2015-03-03 DIAGNOSIS — I272 Other secondary pulmonary hypertension: Secondary | ICD-10-CM | POA: Diagnosis present

## 2015-03-03 DIAGNOSIS — F411 Generalized anxiety disorder: Secondary | ICD-10-CM | POA: Diagnosis present

## 2015-03-03 DIAGNOSIS — K631 Perforation of intestine (nontraumatic): Secondary | ICD-10-CM | POA: Diagnosis present

## 2015-03-03 DIAGNOSIS — Z85820 Personal history of malignant melanoma of skin: Secondary | ICD-10-CM | POA: Diagnosis not present

## 2015-03-03 DIAGNOSIS — K922 Gastrointestinal hemorrhage, unspecified: Secondary | ICD-10-CM | POA: Diagnosis present

## 2015-03-03 DIAGNOSIS — E7800 Pure hypercholesterolemia, unspecified: Secondary | ICD-10-CM | POA: Diagnosis present

## 2015-03-03 DIAGNOSIS — I130 Hypertensive heart and chronic kidney disease with heart failure and stage 1 through stage 4 chronic kidney disease, or unspecified chronic kidney disease: Secondary | ICD-10-CM | POA: Diagnosis present

## 2015-03-03 DIAGNOSIS — I4581 Long QT syndrome: Secondary | ICD-10-CM

## 2015-03-03 DIAGNOSIS — N179 Acute kidney failure, unspecified: Secondary | ICD-10-CM | POA: Diagnosis present

## 2015-03-03 DIAGNOSIS — R9431 Abnormal electrocardiogram [ECG] [EKG]: Secondary | ICD-10-CM | POA: Diagnosis present

## 2015-03-03 DIAGNOSIS — Z808 Family history of malignant neoplasm of other organs or systems: Secondary | ICD-10-CM | POA: Diagnosis not present

## 2015-03-03 DIAGNOSIS — E878 Other disorders of electrolyte and fluid balance, not elsewhere classified: Secondary | ICD-10-CM | POA: Diagnosis present

## 2015-03-03 DIAGNOSIS — H3530 Unspecified macular degeneration: Secondary | ICD-10-CM | POA: Diagnosis present

## 2015-03-03 DIAGNOSIS — I10 Essential (primary) hypertension: Secondary | ICD-10-CM | POA: Diagnosis not present

## 2015-03-03 DIAGNOSIS — I4892 Unspecified atrial flutter: Secondary | ICD-10-CM | POA: Diagnosis present

## 2015-03-03 DIAGNOSIS — K219 Gastro-esophageal reflux disease without esophagitis: Secondary | ICD-10-CM | POA: Diagnosis present

## 2015-03-03 DIAGNOSIS — Z955 Presence of coronary angioplasty implant and graft: Secondary | ICD-10-CM | POA: Diagnosis not present

## 2015-03-03 DIAGNOSIS — R109 Unspecified abdominal pain: Secondary | ICD-10-CM | POA: Diagnosis present

## 2015-03-03 DIAGNOSIS — D72829 Elevated white blood cell count, unspecified: Secondary | ICD-10-CM | POA: Diagnosis present

## 2015-03-03 LAB — CBC
HCT: 37.4 % (ref 36.0–46.0)
Hemoglobin: 12.3 g/dL (ref 12.0–15.0)
MCH: 27.4 pg (ref 26.0–34.0)
MCHC: 32.9 g/dL (ref 30.0–36.0)
MCV: 83.3 fL (ref 78.0–100.0)
PLATELETS: 172 10*3/uL (ref 150–400)
RBC: 4.49 MIL/uL (ref 3.87–5.11)
RDW: 14.4 % (ref 11.5–15.5)
WBC: 10.1 10*3/uL (ref 4.0–10.5)

## 2015-03-03 LAB — BASIC METABOLIC PANEL
Anion gap: 9 (ref 5–15)
BUN: 16 mg/dL (ref 6–20)
CALCIUM: 7.4 mg/dL — AB (ref 8.9–10.3)
CHLORIDE: 110 mmol/L (ref 101–111)
CO2: 21 mmol/L — ABNORMAL LOW (ref 22–32)
CREATININE: 1.23 mg/dL — AB (ref 0.44–1.00)
GFR calc non Af Amer: 41 mL/min — ABNORMAL LOW (ref >60)
GFR, EST AFRICAN AMERICAN: 47 mL/min — AB (ref >60)
Glucose, Bld: 124 mg/dL — ABNORMAL HIGH (ref 65–99)
Potassium: 3.4 mmol/L — ABNORMAL LOW (ref 3.5–5.1)
SODIUM: 140 mmol/L (ref 135–145)

## 2015-03-03 LAB — LACTIC ACID, PLASMA
LACTIC ACID, VENOUS: 3 mmol/L — AB (ref 0.5–2.0)
LACTIC ACID, VENOUS: 5.6 mmol/L — AB (ref 0.5–2.0)
LACTIC ACID, VENOUS: 2.5 mmol/L — AB (ref 0.5–2.0)
LACTIC ACID, VENOUS: 2.7 mmol/L — AB (ref 0.5–2.0)

## 2015-03-03 LAB — HEMOGLOBIN AND HEMATOCRIT, BLOOD
HCT: 39.7 % (ref 36.0–46.0)
HEMOGLOBIN: 13.1 g/dL (ref 12.0–15.0)

## 2015-03-03 LAB — PROTIME-INR
INR: 1.28 (ref 0.00–1.49)
PROTHROMBIN TIME: 16.1 s — AB (ref 11.6–15.2)

## 2015-03-03 LAB — I-STAT CG4 LACTIC ACID, ED: Lactic Acid, Venous: 4.73 mmol/L (ref 0.5–2.0)

## 2015-03-03 LAB — SURGICAL PCR SCREEN
MRSA, PCR: NEGATIVE
Staphylococcus aureus: NEGATIVE

## 2015-03-03 MED ORDER — IOHEXOL 300 MG/ML  SOLN
100.0000 mL | Freq: Once | INTRAMUSCULAR | Status: AC | PRN
  Administered 2015-03-03: 100 mL via INTRAVENOUS

## 2015-03-03 MED ORDER — SODIUM CHLORIDE 0.9 % IV BOLUS (SEPSIS)
700.0000 mL | Freq: Once | INTRAVENOUS | Status: AC
  Administered 2015-03-03: 700 mL via INTRAVENOUS

## 2015-03-03 MED ORDER — BARIUM SULFATE 2 % PO SUSP
450.0000 mL | Freq: Once | ORAL | Status: DC
  Filled 2015-03-03: qty 450

## 2015-03-03 MED ORDER — SODIUM CHLORIDE 0.9 % IV BOLUS (SEPSIS)
1000.0000 mL | Freq: Once | INTRAVENOUS | Status: AC
  Administered 2015-03-03: 1000 mL via INTRAVENOUS

## 2015-03-03 MED ORDER — SODIUM CHLORIDE 0.9 % IV BOLUS (SEPSIS)
500.0000 mL | Freq: Once | INTRAVENOUS | Status: AC
  Administered 2015-03-03: 500 mL via INTRAVENOUS

## 2015-03-03 MED ORDER — MORPHINE SULFATE (PF) 2 MG/ML IV SOLN: 2.0000 mg | INTRAVENOUS | Status: DC | PRN

## 2015-03-03 MED ORDER — PIPERACILLIN-TAZOBACTAM 3.375 G IVPB
3.3750 g | Freq: Three times a day (TID) | INTRAVENOUS | Status: DC
Start: 2015-03-03 — End: 2015-03-14
  Administered 2015-03-03 – 2015-03-14 (×33): 3.375 g via INTRAVENOUS
  Filled 2015-03-03 (×31): qty 50

## 2015-03-03 MED ORDER — PIPERACILLIN-TAZOBACTAM 3.375 G IVPB 30 MIN
3.3750 g | Freq: Once | INTRAVENOUS | Status: AC
  Administered 2015-03-03: 3.375 g via INTRAVENOUS
  Filled 2015-03-03: qty 50

## 2015-03-03 MED ORDER — SODIUM CHLORIDE 0.9 % IJ SOLN
3.0000 mL | Freq: Two times a day (BID) | INTRAMUSCULAR | Status: DC
  Administered 2015-03-03 – 2015-03-14 (×11): 3 mL via INTRAVENOUS

## 2015-03-03 MED ORDER — HYDROMORPHONE HCL 1 MG/ML IJ SOLN
2.0000 mg | INTRAMUSCULAR | Status: DC | PRN
  Administered 2015-03-03 – 2015-03-04 (×12): 2 mg via INTRAVENOUS
  Filled 2015-03-03 (×12): qty 2

## 2015-03-03 MED ORDER — ONDANSETRON HCL 4 MG/2ML IJ SOLN
4.0000 mg | Freq: Four times a day (QID) | INTRAMUSCULAR | Status: DC | PRN
  Administered 2015-03-03 – 2015-03-04 (×4): 4 mg via INTRAVENOUS
  Filled 2015-03-03 (×4): qty 2

## 2015-03-03 MED ORDER — ENOXAPARIN SODIUM 40 MG/0.4ML ~~LOC~~ SOLN
40.0000 mg | SUBCUTANEOUS | Status: DC
  Administered 2015-03-03 – 2015-03-04 (×2): 40 mg via SUBCUTANEOUS
  Filled 2015-03-03 (×2): qty 0.4

## 2015-03-03 MED ORDER — LACTATED RINGERS IV SOLN
INTRAVENOUS | Status: AC
  Administered 2015-03-03: 04:00:00 via INTRAVENOUS

## 2015-03-03 MED ORDER — ONDANSETRON HCL 4 MG PO TABS: 4.0000 mg | ORAL_TABLET | Freq: Four times a day (QID) | ORAL | Status: DC | PRN

## 2015-03-03 MED ORDER — SODIUM CHLORIDE 0.9 % IV SOLN
INTRAVENOUS | Status: DC
  Administered 2015-03-03: 16:00:00 via INTRAVENOUS
  Administered 2015-03-04: 1000 mL via INTRAVENOUS
  Administered 2015-03-04: 03:00:00 via INTRAVENOUS
  Administered 2015-03-04: 800 mL via INTRAVENOUS
  Administered 2015-03-05: 1000 mL via INTRAVENOUS

## 2015-03-03 NOTE — Progress Notes (Signed)
Initial Nutrition Assessment  DOCUMENTATION CODES:  Not applicable (Cant determine exact etiology of pt wt loss)  INTERVENTION:  Anticipate prolonged NPO status. Will monitor for advancement and add appropriate supplements as able.   NUTRITION DIAGNOSIS:  Predicted suboptimal nutrient intake related to inability to eat as evidenced by NPO status.  GOAL:  Patient will meet greater than or equal to 90% of their needs  MONITOR:  PO intake, Diet advancement, I & O's  REASON FOR ASSESSMENT:  Malnutrition Screening Tool    ASSESSMENT:  80 y/o female PMHx significant for depression, anxiety, CAD, HTN, CHF, nissen fundoplication, IBS, Gastritis, hiatal hernia who presents with new onset abdominal pain. Ct shows perforated colon.   Pt heavily medicated on RD arrival, received most information from family member. She reports that pt had a hernia repair in June. It was slightly complicated and pt had to have part of stomach resected. Family member reports that patient's stomach "is like she had gastric bypass". Family member reports that pt also exercises multiple times each day. These are the main reasons pt has lost about 20 lbs in 5 months and not because of this recent perforation.   Pt mainly follows a full liquid diet because she can "only eat 2 tablespoons of food at a time because her stomach is hourglass shaped". Pt has been instructed to drink 3 Ensure supplements a day. Unfortunately, pt has been struggling with these because she does not like the consistency. RD reccommended trying Ensure Clear/Boost breeze though advised that these are not as high in kcal/pro.   She did not take any vitamin/mineral supplements.  Pt is currently undergoing close observation in [place] she requires immediate surgery. Hopefully, pt will become stable enough to be discharged and go for OP surgery at a later date. For now pt is to remain NPO in [place] of need for surgery. Likely will not be able to meet  nutritional needs in the near future. Will continue to monitor  NFPE: Mild fat loss, but some of this was an intentional/side effect of being so active. Some may be related to altered GI function. Potential temporal/clavicle muscle wasting but daughter states these areas are unchanged.   Diet Order:  Diet NPO time specified  Skin:  Generalized bruising, MSAD to sacrum  Last BM:  12/2  Height:  Ht Readings from Last 1 Encounters:  03/03/15 5\' 2"  (1.575 m)   Weight:  Wt Readings from Last 1 Encounters:  03/03/15 152 lb 1.9 oz (69 kg)   Wt Readings from Last 10 Encounters:  03/03/15 152 lb 1.9 oz (69 kg)  02/14/15 147 lb 12.8 oz (67.042 kg)  02/10/15 148 lb (67.132 kg)  01/09/15 147 lb (66.679 kg)  09/26/14 168 lb (76.204 kg)  09/21/14 168 lb 12.8 oz (76.567 kg)  07/27/14 171 lb (77.565 kg)  06/09/14 167 lb (75.751 kg)  05/11/14 164 lb (74.39 kg)  05/07/14 166 lb (75.297 kg)   Admit weight: 148 lbs.   Ideal Body Weight:     BMI:  Body mass index is 27.82 kg/(m^2).  Estimated Nutritional Needs:  Kcal:  1650-1850 kcals Protein:  67-80 g Pro (1-1.2 g/kg bw) Fluid:  1.7-1.9 liters fluid  EDUCATION NEEDS:  No education needs identified at this time  Burtis Junes RD, LDN Nutrition Pager: [phone] 03/03/2015 1:17 PM

## 2015-03-03 NOTE — Progress Notes (Signed)
Notified MD of pt's EKG results. Awaiting response.

## 2015-03-03 NOTE — H&P (Signed)
PCP:   Terald Sleeper, PA-C   Chief Complaint:  Abdominal pain  HPI: 80 yo female h/o CAD, hiatal hernia, several months of epigastric /back pain being seen and work up by GI as outpatient comes in tonight with a different kind of abdominal pain new and severe since around 4p in the [place] mostly of her abdomen with associated nausea no vomiting.  No fevers.  No chills.  No diarrhea.  The pain was sudden onset and has been persistent since around 4pm.  Ct shows perforated bowel.  Dr Arnoldo Morale called with general surgery by EDP.  Pt has not had any vomiting since arrival, pain was better with dilaudid in the ED, but is now again a 9/10.  Pt referred for admission for perforated bowel.  Review of Systems:  Positive and negative as per HPI otherwise all other systems are negative  Past Medical History: Past Medical History  Diagnosis Date  . Depressive disorder, not elsewhere classified   . Anxiety state, unspecified   . Pure hypercholesterolemia   . Esophageal reflux   . Infection of esophagostomy ([place])   . Congestive heart failure, unspecified     diastolic heart failure  . Pulmonary hypertension (HCC)     PA systolic pressure [id number] mmHg by echocardiogram, PA pressure 33/10 by cardiac catheterization PA saturation 62% thermodilution cardiac index 2.0 thick cardiac index 2.4  . Bradycardia     previously with coreg  . Tricuspid regurgitation     moderate tricuspid regurgitation by echocardiogram, prior use of anorexic agents  . Melanoma ([place]) 1995    removal on back   . Coronary artery disease     a. Moderate to severe coronary disease involving the left anterior descending artery and right coronary artery.  Sequential stenosis in the LAD is significant.  Right coronary artery is moderate to severely likely nonischemic. Questionable small LVOT obstruction.  No Brockenbrough maneuver was performed.  Catheterization January 2013; b. 05/2014 MV no isch/infarct, EF 60%.  Marland Kitchen PONV (postoperative  nausea and vomiting)   . Hypertension   . H/O hiatal hernia   . Gastritis   . [name]'s esophagus   . IBS (irritable bowel syndrome)   . Macular degeneration   . Dysrhythmia    Past Surgical History  Procedure Laterality Date  . Nissen fundoplication  [id number]  . Rotator cuff repair  ~ 2001    right  . Coronary angioplasty with stent placement  05/08/11    "1"  . Tonsillectomy  ~ 1944  . Shoulder arthroscopy  ~ 2004; 2005    left; "joint's wore out"  . Cataract extraction w/ intraocular lens  implant, bilateral  ~ 2002  . Tubal ligation  1972  . Percutaneous coronary stent intervention (pci-s) N/A 05/08/2011    Procedure: PERCUTANEOUS CORONARY STENT INTERVENTION (PCI-S);  Surgeon: Wellington Hampshire, MD;  Location: [facility] CATH LAB;  Service: Cardiovascular;  Laterality: N/A;  . Esophageal manometry N/A 06/06/2014    Procedure: ESOPHAGEAL MANOMETRY (EM);  Surgeon: Jerene Bears, MD;  Location: [facility] ENDOSCOPY;  Service: Gastroenterology;  Laterality: N/A;  . Laparoscopic nissen fundoplication N/A [id number]    Procedure: LAPAROSCOPIC  LYSIS OF ADHESIONS, SEGMENTAL GASTRECTOMY, PARTIAL REDUCTION OF HERNIA;  Surgeon: [name], MD;  Location: [facility] ORS;  Service: General;  Laterality: N/A;  . Hernia repair      Medications: Prior to Admission medications   Medication Sig Start Date End Date Taking? Authorizing Provider  albuterol (PROAIR HFA) 108 (90 BASE) MCG/ACT inhaler  Inhale 2 puffs into the lungs every 6 (six) hours as needed for shortness of breath.    Yes Historical Provider, MD  alendronate (FOSAMAX) 70 MG tablet Take 70 mg by mouth every 7 (seven) days. Take with a full glass of water on an empty stomach on Mondays   Yes Historical Provider, MD  ALPRAZolam (XANAX) 0.25 MG tablet Take 0.25 mg by mouth 3 (three) times daily as needed for anxiety.    Yes Historical Provider, MD  aspirin 81 MG chewable tablet Chew 81 mg by mouth every evening.   Yes Historical Provider, MD  citalopram (CELEXA)  40 MG tablet Take 0.5 tablets (20 mg total) by mouth daily. Patient taking differently: Take 20 mg by mouth at bedtime.  09/12/12  Yes Nimish Luther Parody, MD  esomeprazole (NEXIUM) 40 MG capsule Take 20 mg by mouth daily before breakfast.    Yes Historical Provider, MD  ferrous sulfate 325 (65 FE) MG EC tablet Take 325 mg by mouth daily with breakfast.     Yes Historical Provider, MD  ibuprofen (ADVIL,MOTRIN) 200 MG tablet Take 200 mg by mouth every 6 (six) hours as needed for moderate pain.   Yes Historical Provider, MD  loratadine (CLARITIN) 10 MG tablet Take 10 mg by mouth daily.   Yes Historical Provider, MD  losartan (COZAAR) 100 MG tablet Take 1 tablet (100 mg total) by mouth daily. 11/11/12  Yes Herminio Commons, MD  Multiple Vitamins-Minerals (PRESERVISION AREDS) TABS Take 1 tablet by mouth 2 (two) times daily.    Yes Historical Provider, MD  nitroGLYCERIN (NITROSTAT) 0.4 MG SL tablet Place 0.4 mg under the tongue every 5 (five) minutes as needed for chest pain.   Yes Historical Provider, MD  ondansetron (ZOFRAN ODT) 4 MG disintegrating tablet 4mg  ODT q4 hours prn nausea/vomit Patient taking differently: Take 4 mg by mouth every 4 (four) hours as needed for nausea or vomiting. 4mg  ODT q4 hours prn nausea/vomit 02/10/15  Yes Milton Ferguson, MD  Propylene Glycol (SYSTANE BALANCE OP) Place 1-2 drops into both eyes daily as needed (dry eyes).   Yes Historical Provider, MD  rosuvastatin (CRESTOR) 10 MG tablet Take 10 mg by mouth at bedtime.    Yes Historical Provider, MD  traZODone (DESYREL) 100 MG tablet Take 100 mg by mouth at bedtime.    Yes Historical Provider, MD  Vitamin D, Ergocalciferol, (DRISDOL) 50000 UNITS CAPS Take 50,000 Units by mouth every 7 (seven) days. Patient takes on Tuesdays   Yes Historical Provider, MD  zolpidem (AMBIEN) 10 MG tablet Take 10 mg by mouth at bedtime.     Yes Historical Provider, MD  oxyCODONE-acetaminophen (PERCOCET/ROXICET) 5-325 MG tablet Take 1 tablet by  mouth every 6 (six) hours as needed. Patient not taking: Reported on 03/02/2015 02/10/15   Milton Ferguson, MD    Allergies:   Allergies  Allergen Reactions  . Sulfamethoxazole Rash    Social History:  reports that she quit smoking about 26 years ago. Her smoking use included Cigarettes. She started smoking about 56 years ago. She has a 20 pack-year smoking history. She has never used smokeless tobacco. She reports that she does not drink alcohol or use illicit drugs.  Family History: Family History  Problem Relation Age of Onset  . Heart attack Father   . Hypertension Father   . Diabetes Mother   . Hypertension Mother   . Colon cancer Neg Hx   . Esophageal cancer Neg Hx   . Rectal cancer Neg  Hx   . Stomach cancer Neg Hx   . Skin cancer Brother     melanoma    Physical Exam: Filed Vitals:   03/02/15 2215 03/02/15 2314 03/02/15 2348 03/03/15 0000  BP: 153/100 189/82 180/60 179/78  Pulse: 73 106 88   Temp:   98 F (36.7 C)   TempSrc:   Oral   Resp:  19 14 15   Height:      Weight:      SpO2: 100% 100% 99%    General appearance: alert, cooperative and no distress Head: Normocephalic, without obvious abnormality, atraumatic Eyes: negative Nose: Nares normal. Septum midline. Mucosa normal. No drainage or sinus tenderness. Neck: no JVD and supple, symmetrical, trachea midline Lungs: clear to auscultation bilaterally Heart: regular rate and rhythm, S1, S2 normal, no murmur, click, rub or gallop Abdomen: soft, nd diffusely ttp, hypoactive bs no r/g nonacute abdomen Extremities: extremities normal, atraumatic, no cyanosis or edema Pulses: 2+ and symmetric Skin: Skin color, texture, turgor normal. No rashes or lesions Neurologic: Grossly normal    Labs on Admission:   Recent Labs  03/02/15 2045  NA 137  K 3.3*  CL 102  CO2 21*  GLUCOSE 121*  BUN 17  CREATININE 1.29*  CALCIUM 9.3    Recent Labs  03/02/15 2045  AST 21  ALT 14  ALKPHOS 59  BILITOT 0.9   PROT 7.1  ALBUMIN 4.5    Recent Labs  03/02/15 2045  LIPASE 24    Recent Labs  03/02/15 2045 03/03/15  WBC 13.2*  --   NEUTROABS 10.8*  --   HGB 12.8 13.1  HCT 38.7 39.7  MCV 82.2  --   PLT 189  --     Recent Labs  03/02/15 2045  TROPONINI <0.03   Radiological Exams on Admission: Ct Abdomen Pelvis W Contrast  03/03/2015  CLINICAL DATA:  Sudden onset abdominal pain in the left lower quadrant at 17:00. EXAM: CT ABDOMEN AND PELVIS WITH CONTRAST TECHNIQUE: Multidetector CT imaging of the abdomen and pelvis was performed using the standard protocol following bolus administration of intravenous contrast. CONTRAST:  140mL OMNIPAQUE IOHEXOL 300 MG/ML  SOLN COMPARISON:  02/10/2015 FINDINGS: There is a large volume of extraluminal air, predominantly retroperitoneal. The extraluminal air probably originates from a perforation of the mid sigmoid colon. There is no bowel obstruction. No abscess or drainable fluid collection is evident. Large hiatal hernia again noted. There are unremarkable appearances of the liver, spleen, pancreas, adrenals and kidneys except for a few benign renal cysts. Uterus and ovaries are unremarkable. Abdominal aorta is normal in caliber with moderate atherosclerotic calcification. No significant abnormalities evident in the lower chest. No significant musculoskeletal lesion is evident. IMPRESSION: Large volume extraluminal air, predominantly retroperitoneal, probably originating from a perforation of the mid sigmoid colon. Critical Value/emergent results were called by telephone at the time of interpretation on 03/03/2015 at 12:45 am to Dr. Tomi Bamberger , who verbally acknowledged these results. Electronically Signed   By: Andreas Newport M.D.   On: 03/03/2015 00:49   Dg Abd Acute W/chest  03/02/2015  CLINICAL DATA:  Left lower quadrant pain. Bloody diarrhea. Shortness of breath. EXAM: DG ABDOMEN ACUTE W/ 1V CHEST COMPARISON:  None. FINDINGS: There is no evidence of dilated  bowel loops or free intraperitoneal air. Large amount of stool throughout the colon. No radiopaque calculi or other significant radiographic abnormality is seen. Heart size and mediastinal contours are within normal limits. Both lungs are clear. IMPRESSION: Large amount of  stool throughout the colon. No acute cardiopulmonary disease. Electronically Signed   By: Kathreen Devoid   On: 03/02/2015 23:15   Old record reviewed AAS reviewed no acute issues ekg reviewed nsr no acute issues prolonged qtc over 500 Almgren discussed with dr knapp  Assessment/Plan  80 yo female with acute lower abdominal pain found to have perforated mid sigmoid colon  Principal Problem:   Perforated sigmoid colon ([place])-  Pt placed on iv zosyn.  Keep npo and hold anticoagulants for likely surgery in the next couple of hours.  Dr Arnoldo Morale called and aware of patient [place].  Will give another liter of ivf, given 1.5 liters in the ED.    Active Problems:   Anxiety state- noted   DIASTOLIC DYSFUNCTION- noted   Coronary artery disease- noted   Abdominal pain- due to above   Lactic acidosis-  Serial lactic acid levels,  ivf   Prolonged Q-T interval on ECG-  Repeat 12 lead ekg in am, avoid qt prolonging agents  Admit to tele floor.  Full code.  Marquist Binstock A 03/03/2015, 1:49 AM

## 2015-03-03 NOTE — ED Provider Notes (Signed)
Patient with left with me at change of shift to get results of her CT scan.  0047 radiologist called her CT report.  Patient was started on IV Zosyn which is the recommended antibiotic for severe diverticulitis.  Patient reports she started getting left lower and lower midline abdominal pain about 4 PM this afternoon. She has been having some abdominal pain intermittently for the last couple weeks. She was seen in the ED on November 11 and had a CT angina of her chest done which showed a large hiatal hernia but no pulmonary emboli. She also had a CT renal stone study done which showed no acute findings. At that point she was having right flank pain. She was noted to have some intrarenal stones. She had diverticula without diverticulitis.  On exam patient has decreased bowel sounds. Her abdomen is tender in the left lower quadrant.  I discussed her CT report with the patient and her daughter. Although patient had surgery earlier this year by Dr. Zella Richer and saw his PA in the office last week she does not want to go to [facility] for her care. She prefers to stay in [place].   01:01 Dr Arnoldo Morale, general surgery, have medicine admit and he will see patient.   Patient's lactic acid is rising from 4-4.7. She was given another bolus of IV fluids.  01:23 Dr Shanon Brow, hospitalist, admit to tele  Ct Abdomen Pelvis W Contrast  03/03/2015  CLINICAL DATA:  Sudden onset abdominal pain in the left lower quadrant at 17:00. EXAM: CT ABDOMEN AND PELVIS WITH CONTRAST TECHNIQUE: Multidetector CT imaging of the abdomen and pelvis was performed using the standard protocol following bolus administration of intravenous contrast. CONTRAST:  172mL OMNIPAQUE IOHEXOL 300 MG/ML  SOLN COMPARISON:  02/10/2015 FINDINGS: There is a large volume of extraluminal air, predominantly retroperitoneal. The extraluminal air probably originates from a perforation of the mid sigmoid colon. There is no bowel obstruction. No abscess or  drainable fluid collection is evident. Large hiatal hernia again noted. There are unremarkable appearances of the liver, spleen, pancreas, adrenals and kidneys except for a few benign renal cysts. Uterus and ovaries are unremarkable. Abdominal aorta is normal in caliber with moderate atherosclerotic calcification. No significant abnormalities evident in the lower chest. No significant musculoskeletal lesion is evident. IMPRESSION: Large volume extraluminal air, predominantly retroperitoneal, probably originating from a perforation of the mid sigmoid colon. Critical Value/emergent results were called by telephone at the time of interpretation on 03/03/2015 at 12:45 am to Dr. Tomi Bamberger , who verbally acknowledged these results. Electronically Signed   By: Andreas Newport M.D.   On: 03/03/2015 00:49   Dg Abd Acute W/chest  03/02/2015  CLINICAL DATA:  Left lower quadrant pain. Bloody diarrhea. Shortness of breath. EXAM: DG ABDOMEN ACUTE W/ 1V CHEST COMPARISON:  None. FINDINGS: There is no evidence of dilated bowel loops or free intraperitoneal air. Large amount of stool throughout the colon. No radiopaque calculi or other significant radiographic abnormality is seen. Heart size and mediastinal contours are within normal limits. Both lungs are clear. IMPRESSION: Large amount of stool throughout the colon. No acute cardiopulmonary disease. Electronically Signed   By: Kathreen Devoid   On: 03/02/2015 23:15   Diagnoses that have been ruled out:  None  Diagnoses that are still under consideration:  None  Final diagnoses:  Left lower quadrant pain  Gastrointestinal hemorrhage, unspecified gastritis, unspecified gastrointestinal hemorrhage type  Perforated sigmoid colon ([place])    Plan admission   CRITICAL CARE Performed by:  Amra Shukla L Total critical care time: 31 minutes Critical care time was exclusive of separately billable procedures and treating other patients. Critical care was necessary to treat or  prevent imminent or life-threatening deterioration. Critical care was time spent personally by me on the following activities: development of treatment plan with patient and/or surrogate as well as nursing, discussions with consultants, evaluation of patient's response to treatment, examination of patient, obtaining history from patient or surrogate, ordering and performing treatments and interventions, ordering and review of laboratory studies, ordering and review of radiographic studies, pulse oximetry and re-evaluation of patient's condition.     Rolland Porter, MD 03/03/15 [phone]

## 2015-03-03 NOTE — Progress Notes (Signed)
CRITICAL VALUE ALERT  Critical value received:  Lactic  Acid 2.7  Date of notification:  03/03/15  Time of notification:  1907  Critical value read back:Yes.    Nurse who received alert:  Karolee Ohs, RN  MD notified (1st page):  Schorr  Time of first page:  1930  MD notified (2nd page):  Time of second page:  Responding MD:    Time MD responded:

## 2015-03-03 NOTE — Progress Notes (Signed)
CRITICAL VALUE ALERT  Critical value received:  Lactic Acid 2.5  Date of notification:  03/03/15  Time of notification:  1650  Critical value read back:Yes.    Nurse who received alert:  Karolee Ohs, RN  MD notified (1st page):  Dr. Roderic Palau  Time of first page:  1653  MD notified (2nd page):  Time of second page:  Responding MD:  Dr. Roderic Palau  Time MD responded:  [phone]

## 2015-03-03 NOTE — Progress Notes (Signed)
MD phoned and states to give pt another 1L NS bolus of fluid. Will continue to monitor pt.

## 2015-03-03 NOTE — Plan of Care (Signed)
Problem: Pain Managment: Goal: General experience of comfort will improve Outcome: Progressing Pt states her pain and nausea have been better controlled on the floor. Given Dilaudid and Zofran. Will continue to monitor pain and nausea.   Problem: Physical Regulation: Goal: Ability to maintain clinical measurements within normal limits will improve Outcome: Progressing See flowsheet. Lab phoned and pt had a lactic acid of 5.6. Notified MD who ordered a bolus of fluid.   Problem: Skin Integrity: Goal: Risk for impaired skin integrity will decrease Outcome: Progressing Monitoring pink spot on pt's sacrum. Pt states the area came from using a bedpan.   Problem: Fluid Volume: Goal: Ability to maintain a balanced intake and output will improve Outcome: Progressing Pt received a bolus of NS due to Lactic Acid being a 5.6. She will also be on lactate ringers at 100 mLs/hr. Pt is tolerating fluids well.   Problem: Nutrition: Goal: Adequate nutrition will be maintained Outcome: Not Progressing Pt is NPO in preparation for surgery.

## 2015-03-03 NOTE — ED Notes (Signed)
Pt states pain is "ok right now, but I feel loopy", noted pt has been given phenergan and dilaudid for pain and nausea.

## 2015-03-03 NOTE — Consult Note (Signed)
Reason for Consult: Perforated sigmoid colon Referring Physician: Hospitalist  [name] is an 80 y.o. female.  HPI: Patient is a 80 year old white female who has been having intermittent lower abdominal pain over the past few weeks. She had worsening left lower quadrant pain yesterday and presented emergency room for further evaluation and treatment. She has been on CT scan the abdomen have perforated sigmoid colon with some pneumoperitoneum but significant air in the mesentery. No abscess cavity was noted. No contrast extravasation was noted. The patient was admitted the hospital for further evaluation treatment. Her lactic acid have slowly improved. Clinically, the patient does feel better with pain medication.  Past Medical History  Diagnosis Date  . Depressive disorder, not elsewhere classified   . Anxiety state, unspecified   . Pure hypercholesterolemia   . Esophageal reflux   . Infection of esophagostomy ([place])   . Congestive heart failure, unspecified     diastolic heart failure  . Pulmonary hypertension (HCC)     PA systolic pressure 19-50 mmHg by echocardiogram, PA pressure 33/10 by cardiac catheterization PA saturation 62% thermodilution cardiac index 2.0 thick cardiac index 2.4  . Bradycardia     previously with coreg  . Tricuspid regurgitation     moderate tricuspid regurgitation by echocardiogram, prior use of anorexic agents  . Melanoma ([place]) 1995    removal on back   . Coronary artery disease     a. Moderate to severe coronary disease involving the left anterior descending artery and right coronary artery.  Sequential stenosis in the LAD is significant.  Right coronary artery is moderate to severely likely nonischemic. Questionable small LVOT obstruction.  No Brockenbrough maneuver was performed.  Catheterization January 2013; b. 05/2014 MV no isch/infarct, EF 60%.  Marland Kitchen PONV (postoperative nausea and vomiting)   . Hypertension   . H/O hiatal hernia   . Gastritis   .  [name]'s esophagus   . IBS (irritable bowel syndrome)   . Macular degeneration   . Dysrhythmia     Past Surgical History  Procedure Laterality Date  . Nissen fundoplication  9326  . Rotator cuff repair  ~ 2001    right  . Coronary angioplasty with stent placement  05/08/11    "1"  . Tonsillectomy  ~ 1944  . Shoulder arthroscopy  ~ 2004; 2005    left; "joint's wore out"  . Cataract extraction w/ intraocular lens  implant, bilateral  ~ 2002  . Tubal ligation  1972  . Percutaneous coronary stent intervention (pci-s) N/A 05/08/2011    Procedure: PERCUTANEOUS CORONARY STENT INTERVENTION (PCI-S);  Surgeon: Wellington Hampshire, MD;  Location: [facility] CATH LAB;  Service: Cardiovascular;  Laterality: N/A;  . Esophageal manometry N/A 06/06/2014    Procedure: ESOPHAGEAL MANOMETRY (EM);  Surgeon: Jerene Bears, MD;  Location: [facility] ENDOSCOPY;  Service: Gastroenterology;  Laterality: N/A;  . Laparoscopic nissen fundoplication N/A 10/11/4578    Procedure: LAPAROSCOPIC  LYSIS OF ADHESIONS, SEGMENTAL GASTRECTOMY, PARTIAL REDUCTION OF HERNIA;  Surgeon: [name], MD;  Location: [facility] ORS;  Service: General;  Laterality: N/A;  . Hernia repair      Family History  Problem Relation Age of Onset  . Heart attack Father   . Hypertension Father   . Diabetes Mother   . Hypertension Mother   . Colon cancer Neg Hx   . Esophageal cancer Neg Hx   . Rectal cancer Neg Hx   . Stomach cancer Neg Hx   . Skin cancer Brother  melanoma    Social History:  reports that she quit smoking about 26 years ago. Her smoking use included Cigarettes. She started smoking about 56 years ago. She has a 20 pack-year smoking history. She has never used smokeless tobacco. She reports that she does not drink alcohol or use illicit drugs.  Allergies:  Allergies  Allergen Reactions  . Sulfamethoxazole Rash    Medications: I have reviewed the patient's current medications.  Results for orders placed or performed during the hospital  encounter of 03/02/15 (from the past 48 hour(s))  CBC with Differential     Status: Abnormal   Collection Time: 03/02/15  8:45 PM  Result Value Ref Range   WBC 13.2 (H) 4.0 - 10.5 K/uL   RBC 4.71 3.87 - 5.11 MIL/uL   Hemoglobin 12.8 12.0 - 15.0 g/dL   HCT 38.7 36.0 - 46.0 %   MCV 82.2 78.0 - 100.0 fL   MCH 27.2 26.0 - 34.0 pg   MCHC 33.1 30.0 - 36.0 g/dL   RDW 13.9 11.5 - 15.5 %   Platelets 189 150 - 400 K/uL   Neutrophils Relative % 82 %   Neutro Abs 10.8 (H) 1.7 - 7.7 K/uL   Lymphocytes Relative 12 %   Lymphs Abs 1.6 0.7 - 4.0 K/uL   Monocytes Relative 5 %   Monocytes Absolute 0.6 0.1 - 1.0 K/uL   Eosinophils Relative 1 %   Eosinophils Absolute 0.1 0.0 - 0.7 K/uL   Basophils Relative 0 %   Basophils Absolute 0.0 0.0 - 0.1 K/uL  Comprehensive metabolic panel     Status: Abnormal   Collection Time: 03/02/15  8:45 PM  Result Value Ref Range   Sodium 137 135 - 145 mmol/L   Potassium 3.3 (L) 3.5 - 5.1 mmol/L   Chloride 102 101 - 111 mmol/L   CO2 21 (L) 22 - 32 mmol/L   Glucose, Bld 121 (H) 65 - 99 mg/dL   BUN 17 6 - 20 mg/dL   Creatinine, Ser 1.29 (H) 0.44 - 1.00 mg/dL   Calcium 9.3 8.9 - 10.3 mg/dL   Total Protein 7.1 6.5 - 8.1 g/dL   Albumin 4.5 3.5 - 5.0 g/dL   AST 21 15 - 41 U/L   ALT 14 14 - 54 U/L   Alkaline Phosphatase 59 38 - 126 U/L   Total Bilirubin 0.9 0.3 - 1.2 mg/dL   GFR calc non Af Amer 39 (L) >60 mL/min   GFR calc Af Amer 45 (L) >60 mL/min    Comment: (NOTE) The eGFR has been calculated using the CKD EPI equation. This calculation has not been validated in all clinical situations. eGFR's persistently <60 mL/min signify possible Chronic Kidney Disease.    Anion gap 14 5 - 15  Lipase, blood     Status: None   Collection Time: 03/02/15  8:45 PM  Result Value Ref Range   Lipase 24 11 - 51 U/L  Protime-INR     Status: None   Collection Time: 03/02/15  8:45 PM  Result Value Ref Range   Prothrombin Time 14.1 11.6 - 15.2 seconds   INR 1.07 0.00 - 1.49   Troponin I     Status: None   Collection Time: 03/02/15  8:45 PM  Result Value Ref Range   Troponin I <0.03 <0.031 ng/mL    Comment:        NO INDICATION OF MYOCARDIAL INJURY.   Urinalysis, Routine w reflex microscopic (not at [facility])  Status: Abnormal   Collection Time: 03/02/15  9:50 PM  Result Value Ref Range   Color, Urine YELLOW YELLOW   APPearance CLEAR CLEAR   Specific Gravity, Urine 1.010 1.005 - 1.030   pH 6.5 5.0 - 8.0   Glucose, UA NEGATIVE NEGATIVE mg/dL   Hgb urine dipstick LARGE (A) NEGATIVE   Bilirubin Urine NEGATIVE NEGATIVE   Ketones, ur 15 (A) NEGATIVE mg/dL   Protein, ur NEGATIVE NEGATIVE mg/dL   Nitrite NEGATIVE NEGATIVE   Leukocytes, UA NEGATIVE NEGATIVE  Urine microscopic-add on     Status: Abnormal   Collection Time: 03/02/15  9:50 PM  Result Value Ref Range   Squamous Epithelial / LPF 0-5 (A) NONE SEEN   WBC, UA 0-5 0 - 5 WBC/hpf   RBC / HPF 6-30 0 - 5 RBC/hpf   Bacteria, UA FEW (A) NONE SEEN  I-Stat CG4 Lactic Acid, ED     Status: Abnormal   Collection Time: 03/02/15 10:09 PM  Result Value Ref Range   Lactic Acid, Venous 4.01 (HH) 0.5 - 2.0 mmol/L   Comment NOTIFIED PHYSICIAN   POC occult blood, ED RN will collect     Status: Abnormal   Collection Time: 03/02/15 10:18 PM  Result Value Ref Range   Fecal Occult Bld POSITIVE (A) NEGATIVE  Type and screen [facility]     Status: None   Collection Time: 03/02/15 11:10 PM  Result Value Ref Range   ABO/RH(D) O POS    Antibody Screen NEG    Sample Expiration 03/05/2015   Culture, blood (routine x 2)     Status: None (Preliminary result)   Collection Time: 03/02/15 11:10 PM  Result Value Ref Range   Specimen Description BLOOD RIGHT HAND    Special Requests BOTTLES DRAWN AEROBIC AND ANAEROBIC 4CC EACH    Culture PENDING    Report Status PENDING   Culture, blood (routine x 2)     Status: None (Preliminary result)   Collection Time: 03/02/15 11:23 PM  Result Value Ref Range   Specimen  Description BLOOD LEFT FOREARM    Special Requests      BOTTLES DRAWN AEROBIC AND ANAEROBIC AEB 4CC ANA [place]   Culture PENDING    Report Status PENDING   Hemoglobin and hematocrit, blood     Status: None   Collection Time: 03/03/15 12:00 AM  Result Value Ref Range   Hemoglobin 13.1 12.0 - 15.0 g/dL   HCT 39.7 36.0 - 46.0 %  Lactic acid, plasma     Status: Abnormal   Collection Time: 03/03/15 12:00 AM  Result Value Ref Range   Lactic Acid, Venous 5.6 (HH) 0.5 - 2.0 mmol/L    Comment: CRITICAL RESULT CALLED TO, READ BACK BY AND VERIFIED WITH: HAMILTON S AT 0312 ON [id number] BY FORSYTH K   I-Stat CG4 Lactic Acid, ED     Status: Abnormal   Collection Time: 03/03/15  1:12 AM  Result Value Ref Range   Lactic Acid, Venous 4.73 (HH) 0.5 - 2.0 mmol/L   Comment NOTIFIED PHYSICIAN   Basic metabolic panel     Status: Abnormal   Collection Time: 03/03/15  6:26 AM  Result Value Ref Range   Sodium 140 135 - 145 mmol/L   Potassium 3.4 (L) 3.5 - 5.1 mmol/L   Chloride 110 101 - 111 mmol/L   CO2 21 (L) 22 - 32 mmol/L   Glucose, Bld 124 (H) 65 - 99 mg/dL   BUN 16 6 - 20  mg/dL   Creatinine, Ser 1.23 (H) 0.44 - 1.00 mg/dL   Calcium 7.4 (L) 8.9 - 10.3 mg/dL   GFR calc non Af Amer 41 (L) >60 mL/min   GFR calc Af Amer 47 (L) >60 mL/min    Comment: (NOTE) The eGFR has been calculated using the CKD EPI equation. This calculation has not been validated in all clinical situations. eGFR's persistently <60 mL/min signify possible Chronic Kidney Disease.    Anion gap 9 5 - 15  CBC     Status: None   Collection Time: 03/03/15  6:26 AM  Result Value Ref Range   WBC 10.1 4.0 - 10.5 K/uL   RBC 4.49 3.87 - 5.11 MIL/uL   Hemoglobin 12.3 12.0 - 15.0 g/dL   HCT 37.4 36.0 - 46.0 %   MCV 83.3 78.0 - 100.0 fL   MCH 27.4 26.0 - 34.0 pg   MCHC 32.9 30.0 - 36.0 g/dL   RDW 14.4 11.5 - 15.5 %   Platelets 172 150 - 400 K/uL  Protime-INR     Status: Abnormal   Collection Time: 03/03/15  6:26 AM  Result Value Ref  Range   Prothrombin Time 16.1 (H) 11.6 - 15.2 seconds   INR 1.28 0.00 - 1.49  Lactic acid, plasma     Status: Abnormal   Collection Time: 03/03/15  6:26 AM  Result Value Ref Range   Lactic Acid, Venous 3.0 (HH) 0.5 - 2.0 mmol/L    Comment: CRITICAL RESULT CALLED TO, READ BACK BY AND VERIFIED WITH: HAMILTON,S AT 7:15AM ON 03/03/15 BY FESTERMAN,C     Ct Abdomen Pelvis W Contrast  03/03/2015  CLINICAL DATA:  Sudden onset abdominal pain in the left lower quadrant at 17:00. EXAM: CT ABDOMEN AND PELVIS WITH CONTRAST TECHNIQUE: Multidetector CT imaging of the abdomen and pelvis was performed using the standard protocol following bolus administration of intravenous contrast. CONTRAST:  162m OMNIPAQUE IOHEXOL 300 MG/ML  SOLN COMPARISON:  02/10/2015 FINDINGS: There is a large volume of extraluminal air, predominantly retroperitoneal. The extraluminal air probably originates from a perforation of the mid sigmoid colon. There is no bowel obstruction. No abscess or drainable fluid collection is evident. Large hiatal hernia again noted. There are unremarkable appearances of the liver, spleen, pancreas, adrenals and kidneys except for a few benign renal cysts. Uterus and ovaries are unremarkable. Abdominal aorta is normal in caliber with moderate atherosclerotic calcification. No significant abnormalities evident in the lower chest. No significant musculoskeletal lesion is evident. IMPRESSION: Large volume extraluminal air, predominantly retroperitoneal, probably originating from a perforation of the mid sigmoid colon. Critical Value/emergent results were called by telephone at the time of interpretation on 03/03/2015 at 12:45 am to Dr. KTomi Bamberger, who verbally acknowledged these results. Electronically Signed   By: DAndreas NewportM.D.   On: 03/03/2015 00:49   Dg Abd Acute W/chest  03/02/2015  CLINICAL DATA:  Left lower quadrant pain. Bloody diarrhea. Shortness of breath. EXAM: DG ABDOMEN ACUTE W/ 1V CHEST  COMPARISON:  None. FINDINGS: There is no evidence of dilated bowel loops or free intraperitoneal air. Large amount of stool throughout the colon. No radiopaque calculi or other significant radiographic abnormality is seen. Heart size and mediastinal contours are within normal limits. Both lungs are clear. IMPRESSION: Large amount of stool throughout the colon. No acute cardiopulmonary disease. Electronically Signed   By: HKathreen Devoid  On: 03/02/2015 23:15    ROS: See chart Blood pressure 113/53, pulse 104, temperature 99.4 F (37.4 C),  temperature source Oral, resp. rate 18, height 5' 2"  (1.575 m), weight 69 kg (152 lb 1.9 oz), SpO2 95 %. Physical Exam: Pleasant white female who is resting comfortably in the bed in no acute distress. Abdomen is soft with tenderness noted in the left lower quadrant to palpation. Localized guarding is noted. She does not have a rigid abdomen at this time. She is not particularly distended. Rectal examination was deferred at this time.  Assessment/Plan: Impression: Perforated sigmoid colon, most likely secondary to diverticulum. Plan: We'll monitor patient closely as going to the operating room emergently may result in colostomy formation. I had extensive discussion with the patient and family and told them that she may need emergency surgery should her condition not improve. Ideally, one with what to do elective partial colectomy with primary anastomosis as an outpatient. It is encouraging that her lactic acid level is improving and she does not have a leukocytosis at this time. Would continue IV Zosyn. She can only have ice chips or by mouth meds.  Mikias Lanz A 03/03/2015, 10:09 AM

## 2015-03-03 NOTE — Care Management Important Message (Signed)
Important Message  Patient Details  Name: [name] MRN: [id number] Date of Birth: [birth year]   Medicare Important Message Given:  Yes    Joylene Draft, RN 03/03/2015, 2:30 PM

## 2015-03-03 NOTE — Progress Notes (Signed)
ANTIBIOTIC CONSULT NOTE - INITIAL  Pharmacy Consult for Zosyn Indication: intra-abdominal infection  Allergies  Allergen Reactions  . Sulfamethoxazole Rash   Patient Measurements: Height: 5\' 2"  (157.5 cm) Weight: 152 lb 1.9 oz (69 kg) IBW/kg (Calculated) : 50.1  Vital Signs: Temp: 98.1 F (36.7 C) (12/02 0243) Temp Source: Oral (12/02 0243) BP: 160/62 mmHg (12/02 0243) Pulse Rate: 110 (12/02 0243) Intake/Output from previous day:   Intake/Output from this shift:    Labs:  Recent Labs  03/02/15 2045 03/03/15 03/03/15 0626  WBC 13.2*  --  10.1  HGB 12.8 13.1 12.3  PLT 189  --  172  CREATININE 1.29*  --  1.23*   Estimated Creatinine Clearance: 34.3 mL/min (by C-G formula based on Cr of 1.23). No results for input(s): VANCOTROUGH, VANCOPEAK, VANCORANDOM, GENTTROUGH, GENTPEAK, GENTRANDOM, TOBRATROUGH, TOBRAPEAK, TOBRARND, AMIKACINPEAK, AMIKACINTROU, AMIKACIN in the last 72 hours.   Microbiology: Recent Results (from the past 720 hour(s))  Culture, blood (routine x 2)     Status: None (Preliminary result)   Collection Time: 03/02/15 11:10 PM  Result Value Ref Range Status   Specimen Description BLOOD RIGHT HAND  Final   Special Requests BOTTLES DRAWN AEROBIC AND ANAEROBIC 4CC EACH  Final   Culture PENDING  Incomplete   Report Status PENDING  Incomplete  Culture, blood (routine x 2)     Status: None (Preliminary result)   Collection Time: 03/02/15 11:23 PM  Result Value Ref Range Status   Specimen Description BLOOD LEFT FOREARM  Final   Special Requests   Final    BOTTLES DRAWN AEROBIC AND ANAEROBIC AEB 4CC ANA 2CC   Culture PENDING  Incomplete   Report Status PENDING  Incomplete   Medical History: Past Medical History  Diagnosis Date  . Depressive disorder, not elsewhere classified   . Anxiety state, unspecified   . Pure hypercholesterolemia   . Esophageal reflux   . Infection of esophagostomy ([place])   . Congestive heart failure, unspecified     diastolic  heart failure  . Pulmonary hypertension (HCC)     PA systolic pressure [id number] mmHg by echocardiogram, PA pressure 33/10 by cardiac catheterization PA saturation 62% thermodilution cardiac index 2.0 thick cardiac index 2.4  . Bradycardia     previously with coreg  . Tricuspid regurgitation     moderate tricuspid regurgitation by echocardiogram, prior use of anorexic agents  . Melanoma ([place]) 1995    removal on back   . Coronary artery disease     a. Moderate to severe coronary disease involving the left anterior descending artery and right coronary artery.  Sequential stenosis in the LAD is significant.  Right coronary artery is moderate to severely likely nonischemic. Questionable small LVOT obstruction.  No Brockenbrough maneuver was performed.  Catheterization January 2013; b. 05/2014 MV no isch/infarct, EF 60%.  Marland Kitchen PONV (postoperative nausea and vomiting)   . Hypertension   . H/O hiatal hernia   . Gastritis   . [name]'s esophagus   . IBS (irritable bowel syndrome)   . Macular degeneration   . Dysrhythmia    Anti-infectives    Start     Dose/Rate Route Frequency Ordered Stop   03/03/15 0900  piperacillin-tazobactam (ZOSYN) IVPB 3.375 g     3.375 g 12.5 mL/hr over 240 Minutes Intravenous Every 8 hours 03/03/15 0739     03/03/15 0100  piperacillin-tazobactam (ZOSYN) IVPB 3.375 g     3.375 g 100 mL/hr over 30 Minutes Intravenous  Once 03/03/15 0049 03/03/15 0121  Assessment: 80yo female with c/o abdominal pain in LLQ.  No fevers or chills reported.  CT shows perforated bowel.  Asked to initiate Zosyn for intra-abdominal infection.  Goal of Therapy:  Eradicate infection.  Plan:  Zosyn 3.375gm IV q8h, EID Monitor labs, renal fxn, progress and c/s  [place] Crane, Takoda Janowiak A 03/03/2015,7:40 AM

## 2015-03-03 NOTE — Care Management Note (Signed)
[facility] Management Note  Patient Details  Name: [name] MRN: [id number] Date of Birth: 02-10-1935  Subjective/Objective:                  Pt admitted from home with perforated sigmoid colon. Pt lives alone and will return home at discharge. Pt is independent with ADL's.   Action/Plan: No Cm needs anticipated.  Expected Discharge Date:                  Expected Discharge Plan:  Home/Self Care  In-House Referral:  NA  Discharge planning Services  CM Consult  Post Acute Care Choice:  NA Choice offered to:  NA  DME Arranged:    DME Agency:     HH Arranged:    HH Agency:     Status of Service:  Completed, signed off  Medicare Important Message Given:  Yes Date Medicare IM Given:    Medicare IM give by:    Date Additional Medicare IM Given:    Additional Medicare Important Message give by:     If discussed at [place] of Stay Meetings, dates discussed:    Additional Comments:  Joylene Draft, RN 03/03/2015, 2:30 PM

## 2015-03-03 NOTE — Progress Notes (Addendum)
Patient was admitted to the hospital earlier this morning by Dr. Shanon Brow. Patient seen and examined. She reports that her abdominal pain although still present has improved since admission. She continues to have nausea and dry heaves. Exam shows that her abdomen is diffusely tender but soft. Bowel sounds are absent.    Patient had a CT scan of the A/P that indicated a perforation of the mid sigmoid colon. She was noted to have a significantly elevated lactic acid on admission. She has been treated with aggressive IV hydration, pain medicine and antiemetics. She is also on IV abx. She does not have any fever or leukocytosis. General surgery is following and recommends observation at this point. Will follow closely with serial abdominal exams.    By signing my name below, I, Rosalie Doctor, attest that this documentation has been prepared under the direction and in the presence of [facility]. MD Electronically Signed: Rosalie Doctor, Scribe.   I, Dr. Kathie Dike, personally performed the services described in this documentaiton. All medical record entries made by the scribe were at my direction and in my presence. I have reviewed the chart and agree that the record reflects my personal performance and is accurate and complete  Kathie Dike, MD, 03/03/2015 2:12 PM   Addendum 18:30:  Patient re evaluated. She is laying in bed. Continues to complain of abdominal pain. She does not appear to be in distress. Abdomen is diffusely tender but still soft. Heart rate is in 90's and she is afebrile. Blood pressures are lower than admission, but stable. Lactic acid is trending down with hydration. Will continue current treatments and observation. Change vitals to q4h.   I, Dr. Kathie Dike, personally performed the services described in this documentaiton. All medical record entries made by the scribe were at my direction and in my presence. I have reviewed the chart and agree that the record  reflects my personal performance and is accurate and complete  Kathie Dike, MD, 03/03/2015 6:56 PM

## 2015-03-03 NOTE — Progress Notes (Signed)
CRITICAL VALUE ALERT  Critical value received:  Lactic Acid  Date of notification:  03/03/2015  Time of notification:  03:11  Critical value read back: yes  Nurse who received alert:  Stephannie Peters  MD notified (1st page):  Dr. Shanon Brow  Time of first page:  03:13  MD notified (2nd page):  Time of second page:  Responding MD:  Dr. Shanon Brow  Time MD responded:  03:15

## 2015-03-04 ENCOUNTER — Inpatient Hospital Stay (HOSPITAL_COMMUNITY): Payer: Medicare Other

## 2015-03-04 DIAGNOSIS — N179 Acute kidney failure, unspecified: Secondary | ICD-10-CM

## 2015-03-04 LAB — BASIC METABOLIC PANEL
Anion gap: 7 (ref 5–15)
BUN: 37 mg/dL — AB (ref 6–20)
CHLORIDE: 111 mmol/L (ref 101–111)
CO2: 22 mmol/L (ref 22–32)
Calcium: 7.1 mg/dL — ABNORMAL LOW (ref 8.9–10.3)
Creatinine, Ser: 2.09 mg/dL — ABNORMAL HIGH (ref 0.44–1.00)
GFR, EST AFRICAN AMERICAN: 25 mL/min — AB (ref >60)
GFR, EST NON AFRICAN AMERICAN: 22 mL/min — AB (ref >60)
Glucose, Bld: 108 mg/dL — ABNORMAL HIGH (ref 65–99)
POTASSIUM: 4.6 mmol/L (ref 3.5–5.1)
SODIUM: 140 mmol/L (ref 135–145)

## 2015-03-04 LAB — CBC
HCT: 35.9 % — ABNORMAL LOW (ref 36.0–46.0)
HEMOGLOBIN: 11 g/dL — AB (ref 12.0–15.0)
MCH: 26.5 pg (ref 26.0–34.0)
MCHC: 30.6 g/dL (ref 30.0–36.0)
MCV: 86.5 fL (ref 78.0–100.0)
PLATELETS: 182 10*3/uL (ref 150–400)
RBC: 4.15 MIL/uL (ref 3.87–5.11)
RDW: 15.5 % (ref 11.5–15.5)
WBC: 13.7 10*3/uL — AB (ref 4.0–10.5)

## 2015-03-04 LAB — TROPONIN I
Troponin I: 0.03 ng/mL (ref <0.031)
Troponin I: 0.05 ng/mL — ABNORMAL HIGH (ref <0.031)

## 2015-03-04 LAB — LACTIC ACID, PLASMA
Lactic Acid, Venous: 1.8 mmol/L (ref 0.5–2.0)
Lactic Acid, Venous: 1.9 mmol/L (ref 0.5–2.0)

## 2015-03-04 LAB — TSH: TSH: 7.104 u[IU]/mL — AB (ref 0.350–4.500)

## 2015-03-04 LAB — PHOSPHORUS: PHOSPHORUS: 4.7 mg/dL — AB (ref 2.5–4.6)

## 2015-03-04 LAB — MRSA PCR SCREENING: MRSA by PCR: NEGATIVE

## 2015-03-04 LAB — MAGNESIUM: MAGNESIUM: 1.7 mg/dL (ref 1.7–2.4)

## 2015-03-04 MED ORDER — MAGNESIUM CITRATE PO SOLN
1.0000 | Freq: Once | ORAL | Status: DC
  Filled 2015-03-04: qty 296

## 2015-03-04 MED ORDER — ACETAMINOPHEN 10 MG/ML IV SOLN
INTRAVENOUS | Status: AC
  Filled 2015-03-04: qty 200

## 2015-03-04 MED ORDER — ACETAMINOPHEN 10 MG/ML IV SOLN
1000.0000 mg | Freq: Four times a day (QID) | INTRAVENOUS | Status: AC
  Administered 2015-03-04 – 2015-03-05 (×3): 1000 mg via INTRAVENOUS
  Filled 2015-03-04 (×4): qty 100

## 2015-03-04 MED ORDER — DILTIAZEM HCL 100 MG IV SOLR
5.0000 mg/h | INTRAVENOUS | Status: DC
  Administered 2015-03-04: 5 mg/h via INTRAVENOUS
  Administered 2015-03-04: 15 mg/h via INTRAVENOUS
  Administered 2015-03-05: 7.5 mg/h via INTRAVENOUS
  Administered 2015-03-06: 10 mg/h via INTRAVENOUS
  Administered 2015-03-07 (×2): 5 mg/h via INTRAVENOUS
  Filled 2015-03-04 (×7): qty 100

## 2015-03-04 MED ORDER — DILTIAZEM HCL 100 MG IV SOLR
INTRAVENOUS | Status: AC
  Administered 2015-03-04: 5 mg/h via INTRAVENOUS
  Filled 2015-03-04: qty 100

## 2015-03-04 MED ORDER — DILTIAZEM LOAD VIA INFUSION
10.0000 mg | Freq: Once | INTRAVENOUS | Status: AC
  Administered 2015-03-04: 10 mg via INTRAVENOUS

## 2015-03-04 MED ORDER — SODIUM CHLORIDE 0.9 % IV BOLUS (SEPSIS)
1000.0000 mL | Freq: Once | INTRAVENOUS | Status: AC
  Administered 2015-03-04: 1000 mL via INTRAVENOUS

## 2015-03-04 MED ORDER — ENOXAPARIN SODIUM 30 MG/0.3ML ~~LOC~~ SOLN: 30.0000 mg | SUBCUTANEOUS | Status: DC

## 2015-03-04 MED ORDER — ACETAMINOPHEN 325 MG PO TABS
650.0000 mg | ORAL_TABLET | Freq: Four times a day (QID) | ORAL | Status: DC | PRN
  Administered 2015-03-04 – 2015-03-06 (×2): 650 mg via ORAL
  Filled 2015-03-04 (×2): qty 2

## 2015-03-04 NOTE — Progress Notes (Signed)
Patient reported to have 20 beat run of v-tach this morning per central telemetry, also reported to have heart rate in the 170s about 0900. Nursing was at bedside assisting patient with turning in bed to change linen. Denies any feelings of palpitations, dizziness, chest pain, shortness of breath. C/o abdominal pain, abdomen soft. Alert to self and place, forgetful at times this morning. Notified Dr. Roderic Palau. Also notified BP 102/53 prior to starting NS 1000 ml bolus per Dr Arnoldo Morale order this am. Orders received to continue dilaudid as ordered PRN pain and d/c morphine order. EKG stat. MD to see patient. Donavan Foil, RN

## 2015-03-04 NOTE — Progress Notes (Signed)
Subjective: Patient still has left lower quadrant abdominal pain, but states it is somewhat better. She claims she has passed flatus.  Objective: Vital signs in last 24 hours: Temp:  [98.2 F (36.8 C)-99.4 F (37.4 C)] 98.9 F (37.2 C) (12/03 0702) Pulse Rate:  [97-108] 104 (12/03 0702) Resp:  [18-20] 18 (12/03 0702) BP: (92-126)/(48-77) 119/61 mmHg (12/03 0702) SpO2:  [92 %-97 %] 92 % (12/03 0702) Weight:  [69 kg (152 lb 1.9 oz)] 69 kg (152 lb 1.9 oz) (12/03 0304) Last BM Date: 03/03/15  Intake/Output from previous day: 12/02 0701 - 12/03 0700 In: 480 [P.O.:480] Out: 2 [Urine:2] Intake/Output this shift:    General appearance: cooperative, appears stated age, fatigued and no distress GI: Soft with tenderness noted to palpation of left lower quadrant. It is nondistended. Bowel sounds are minimal. No rigidity noted.  Lab Results:   Recent Labs  03/03/15 0626 03/04/15 0633  WBC 10.1 13.7*  HGB 12.3 11.0*  HCT 37.4 35.9*  PLT 172 182   BMET  Recent Labs  03/03/15 0626 03/04/15 0633  NA 140 140  K 3.4* 4.6  CL 110 111  CO2 21* 22  GLUCOSE 124* 108*  BUN 16 37*  CREATININE 1.23* 2.09*  CALCIUM 7.4* 7.1*   PT/INR  Recent Labs  03/02/15 2045 03/03/15 0626  LABPROT 14.1 16.1*  INR 1.07 1.28    Studies/Results: Ct Abdomen Pelvis W Contrast  03/03/2015  CLINICAL DATA:  Sudden onset abdominal pain in the left lower quadrant at 17:00. EXAM: CT ABDOMEN AND PELVIS WITH CONTRAST TECHNIQUE: Multidetector CT imaging of the abdomen and pelvis was performed using the standard protocol following bolus administration of intravenous contrast. CONTRAST:  13mL OMNIPAQUE IOHEXOL 300 MG/ML  SOLN COMPARISON:  02/10/2015 FINDINGS: There is a large volume of extraluminal air, predominantly retroperitoneal. The extraluminal air probably originates from a perforation of the mid sigmoid colon. There is no bowel obstruction. No abscess or drainable fluid collection is evident.  Large hiatal hernia again noted. There are unremarkable appearances of the liver, spleen, pancreas, adrenals and kidneys except for a few benign renal cysts. Uterus and ovaries are unremarkable. Abdominal aorta is normal in caliber with moderate atherosclerotic calcification. No significant abnormalities evident in the lower chest. No significant musculoskeletal lesion is evident. IMPRESSION: Large volume extraluminal air, predominantly retroperitoneal, probably originating from a perforation of the mid sigmoid colon. Critical Value/emergent results were called by telephone at the time of interpretation on 03/03/2015 at 12:45 am to Dr. Tomi Bamberger , who verbally acknowledged these results. Electronically Signed   By: Andreas Newport M.D.   On: 03/03/2015 00:49   Dg Abd Acute W/chest  03/02/2015  CLINICAL DATA:  Left lower quadrant pain. Bloody diarrhea. Shortness of breath. EXAM: DG ABDOMEN ACUTE W/ 1V CHEST COMPARISON:  None. FINDINGS: There is no evidence of dilated bowel loops or free intraperitoneal air. Large amount of stool throughout the colon. No radiopaque calculi or other significant radiographic abnormality is seen. Heart size and mediastinal contours are within normal limits. Both lungs are clear. IMPRESSION: Large amount of stool throughout the colon. No acute cardiopulmonary disease. Electronically Signed   By: Kathreen Devoid   On: 03/02/2015 23:15    Anti-infectives: Anti-infectives    Start     Dose/Rate Route Frequency Ordered Stop   03/03/15 0900  piperacillin-tazobactam (ZOSYN) IVPB 3.375 g     3.375 g 12.5 mL/hr over 240 Minutes Intravenous Every 8 hours 03/03/15 0739     03/03/15 0100  piperacillin-tazobactam (ZOSYN) IVPB 3.375 g     3.375 g 100 mL/hr over 30 Minutes Intravenous  Once 03/03/15 0049 03/03/15 0121      Assessment/Plan: Impression: Sigmoid colon perforation with mostly mesenteric/retroperitoneal air present. Mild renal insufficiency noted. Encouraged her lactic acid  level has normalized. She does have leukocytosis, which is not surprising.  Blood pressure is stable at this time. Plan: Would continue current therapy with increasing her fluid input. No need for acute surgical intervention, though if she does not significantly improve over the next 24-48 hours, a partial colectomy would be warranted.   LOS: 1 day    Michalina Calbert A 03/04/2015

## 2015-03-04 NOTE — Progress Notes (Signed)
Patient transferred to [facility] Room 6 via bed. Received by Rica Koyanagi, RN. Daughters were at bedside and aware of transfer. Received call from central telemetry just before transfer that patient had converted to afib. HR 140s. Text-paged Dr. Roderic Palau to notify. RT obtained EKG upon arrival to stepdown unit. Donavan Foil, RN

## 2015-03-04 NOTE — Progress Notes (Signed)
Patient to be transferred to stepdown unit. Report given to [facility], RN. Donavan Foil, RN

## 2015-03-04 NOTE — Progress Notes (Addendum)
[facility] PROGRESS NOTE  [name] [id number] DOB: 1935/03/13 DOA: 03/02/2015 PCP: Terald Sleeper, PA-C  Assessment/Plan: 1. Perforated sigmoid colon, revealed on CT A/P 12/2. Dr. Arnoldo Morale following closely and will perform emergent surgery if she worsens. For now continue to observe and monitor closely. Lactic acid has normalized following IVF. WBC 13.7, afebrile. Continue antiemetics, pain management, and IVF.  Continue abx as well as NPO status. 2. AKI superimposed on CKD stage 3, likely due to IV contrast. Will continue IVF and monitor. If renal function does not improve, can consider nephrology consult 3. GERD, continue PPI. 4. Essential HTN, stable. Borderline hypotensive during course of admission so will hold antihypertensive medications. 5. CAD, stable. Continue ASA  6. HLD, continue statin.   Code Status: Full DVT prophylaxis: Lovenox Family Communication: No family at bedside. Discussed with patient who understands and has no concerns at this time.   Disposition Plan: Discharge once improved   Consultants:  General surgery  Procedures:    Antibiotics:  Zosyn 12/2>>  HPI/Subjective: Feels better. Abdominal pain and nausea are improved. Has not had a BM since admission but is passing flatus.   Objective: Filed Vitals:   03/04/15 0304 03/04/15 0702  BP: 126/48 119/61  Pulse: 108 104  Temp: 98.9 F (37.2 C) 98.9 F (37.2 C)  Resp: 18 18    Intake/Output Summary (Last 24 hours) at 03/04/15 0746 Last data filed at 03/04/15 0003  Gross per 24 hour  Intake    480 ml  Output      2 ml  Net    478 ml   Filed Weights   03/02/15 2042 03/03/15 0243 03/04/15 0304  Weight: 67.132 kg (148 lb) 69 kg (152 lb 1.9 oz) 69 kg (152 lb 1.9 oz)    Exam:  General: NAD, looks comfortable, fatigued Cardiovascular: tachycardic, regular rhythm  Respiratory: clear bilaterally, No wheezing, rales or rhonchi Abdomen: soft, Diffuse TTP, worse in the LLQ, no  distention, bowel sounds normal Musculoskeletal: No edema b/l   Data Reviewed: Basic Metabolic Panel:  Recent Labs Lab 03/02/15 2045 03/03/15 0626 03/04/15 0633  NA 137 140 140  K 3.3* 3.4* 4.6  CL 102 110 111  CO2 21* 21* 22  GLUCOSE 121* 124* 108*  BUN 17 16 37*  CREATININE 1.29* 1.23* 2.09*  CALCIUM 9.3 7.4* 7.1*  MG  --   --  1.7  PHOS  --   --  4.7*   Liver Function Tests:  Recent Labs Lab 03/02/15 2045  AST 21  ALT 14  ALKPHOS 59  BILITOT 0.9  PROT 7.1  ALBUMIN 4.5    Recent Labs Lab 03/02/15 2045  LIPASE 24   No results for input(s): AMMONIA in the last 168 hours. CBC:  Recent Labs Lab 03/02/15 2045 03/03/15 03/03/15 0626 03/04/15 0633  WBC 13.2*  --  10.1 13.7*  NEUTROABS 10.8*  --   --   --   HGB 12.8 13.1 12.3 11.0*  HCT 38.7 39.7 37.4 35.9*  MCV 82.2  --  83.3 86.5  PLT 189  --  172 182   Cardiac Enzymes:  Recent Labs Lab 03/02/15 2045  TROPONINI <0.03   BNP (last 3 results)  Recent Labs  05/08/14 0315  BNP 182.8*      Recent Results (from the past 240 hour(s))  Culture, blood (routine x 2)     Status: None (Preliminary result)   Collection Time: 03/02/15 11:10 PM  Result Value Ref Range Status  Specimen Description BLOOD RIGHT HAND  Final   Special Requests BOTTLES DRAWN AEROBIC AND ANAEROBIC 4CC EACH  Final   Culture PENDING  Incomplete   Report Status PENDING  Incomplete  Culture, blood (routine x 2)     Status: None (Preliminary result)   Collection Time: 03/02/15 11:23 PM  Result Value Ref Range Status   Specimen Description BLOOD LEFT FOREARM  Final   Special Requests   Final    BOTTLES DRAWN AEROBIC AND ANAEROBIC AEB 4CC ANA 2CC   Culture PENDING  Incomplete   Report Status PENDING  Incomplete  Surgical pcr screen     Status: None   Collection Time: 03/03/15  2:45 AM  Result Value Ref Range Status   MRSA, PCR NEGATIVE NEGATIVE Final   Staphylococcus aureus NEGATIVE NEGATIVE Final    Comment:        The  Xpert SA Assay (FDA approved for NASAL specimens in patients over 39 years of age), is one component of a comprehensive surveillance program.  Test performance has been validated by [facility] for patients greater than or equal to 11 year old. It is not intended to diagnose infection nor to guide or monitor treatment.      Studies: Ct Abdomen Pelvis W Contrast  03/03/2015  CLINICAL DATA:  Sudden onset abdominal pain in the left lower quadrant at 17:00. EXAM: CT ABDOMEN AND PELVIS WITH CONTRAST TECHNIQUE: Multidetector CT imaging of the abdomen and pelvis was performed using the standard protocol following bolus administration of intravenous contrast. CONTRAST:  129mL OMNIPAQUE IOHEXOL 300 MG/ML  SOLN COMPARISON:  02/10/2015 FINDINGS: There is a large volume of extraluminal air, predominantly retroperitoneal. The extraluminal air probably originates from a perforation of the mid sigmoid colon. There is no bowel obstruction. No abscess or drainable fluid collection is evident. Large hiatal hernia again noted. There are unremarkable appearances of the liver, spleen, pancreas, adrenals and kidneys except for a few benign renal cysts. Uterus and ovaries are unremarkable. Abdominal aorta is normal in caliber with moderate atherosclerotic calcification. No significant abnormalities evident in the lower chest. No significant musculoskeletal lesion is evident. IMPRESSION: Large volume extraluminal air, predominantly retroperitoneal, probably originating from a perforation of the mid sigmoid colon. Critical Value/emergent results were called by telephone at the time of interpretation on 03/03/2015 at 12:45 am to Dr. Tomi Bamberger , who verbally acknowledged these results. Electronically Signed   By: Andreas Newport M.D.   On: 03/03/2015 00:49   Dg Abd Acute W/chest  03/02/2015  CLINICAL DATA:  Left lower quadrant pain. Bloody diarrhea. Shortness of breath. EXAM: DG ABDOMEN ACUTE W/ 1V CHEST COMPARISON:  None.  FINDINGS: There is no evidence of dilated bowel loops or free intraperitoneal air. Large amount of stool throughout the colon. No radiopaque calculi or other significant radiographic abnormality is seen. Heart size and mediastinal contours are within normal limits. Both lungs are clear. IMPRESSION: Large amount of stool throughout the colon. No acute cardiopulmonary disease. Electronically Signed   By: Kathreen Devoid   On: 03/02/2015 23:15    Scheduled Meds: . enoxaparin (LOVENOX) injection  40 mg Subcutaneous Q24H  . piperacillin-tazobactam (ZOSYN)  IV  3.375 g Intravenous Q8H  . sodium chloride  3 mL Intravenous Q12H   Continuous Infusions: . sodium chloride 100 mL/hr at 03/04/15 0251    Principal Problem:   Perforated sigmoid colon (HCC) Active Problems:   HYPERCHOLESTEROLEMIA   Anxiety state   DIASTOLIC DYSFUNCTION   GERD   HTN (hypertension)  Coronary artery disease   Abdominal pain   Lactic acidosis   Prolonged Q-T interval on ECG   CKD (chronic kidney disease) stage 3, GFR 30-59 ml/min    Time spent: 25 minutes   Fredna Stricker. MD  [facility] Pager [phone]. If 7PM-7AM, please contact night-coverage at [email or web address], password [facility] 03/04/2015, 7:46 AM  LOS: 1 day     By signing my name below, I, Rosalie Doctor, attest that this documentation has been prepared under the direction and in the presence of [facility]. MD Electronically Signed: Rosalie Doctor, Scribe. 03/04/2015 9:46am  I, Dr. Kathie Dike, personally performed the services described in this documentaiton. All medical record entries made by the scribe were at my direction and in my presence. I have reviewed the chart and agree that the record reflects my personal performance and is accurate and complete  Kathie Dike, MD, 03/04/2015 9:57 AM    Addendum 12:45:  Called to evaluate patient for tachycardia. She recently had gotten up to use the bathroom. She continues to complain of  abdominal pain. Heart rate is sustaining in 140-150 range. Appears regular on telemetry, but will check EKG. BP is stable in 130s. Abdomen is diffusely tender, but still feels soft. Tachycardia may be reactive to pain from moving around. Will continue with pain management and IVF. Since it appears that patient would benefit from closer monitoring, will move her to the step down unit.   Payal Stanforth  Addendum 18:30:  Recheck patient. She continues to have abdominal pain, no vomiting. Abdomen still feels soft. EKG shows tachycardia may be atrial flutter. She has been started on cardizem for rate control. Cardiac enzymes thus far negative. Echo has been ordered. She is starting to develop low grade fevers at 100.28F. Blood pressure stable in 130s. Will increase cardizem as tolerated for better rate control. Tylenol for fever which when resolved, should hopefully help with tachycardia. Continue IV antibiotics and fluids. Discussed with Dr. Arnoldo Morale who will evaluate the patient.  Momo Braun

## 2015-03-04 NOTE — Progress Notes (Signed)
Patient tachycardic with HR in the 150s-160s sustaining. Denies any complaints. Notified Dr. Roderic Palau. MD to assess. Donavan Foil, RN

## 2015-03-04 NOTE — Progress Notes (Signed)
Transferred to ICU noted. Patient currently in atrial flutter with variable block. Troponin was negative. Patient did have an episode of emesis but feels comfortable now. She denies any abdominal pain. Her blood pressure is stable on Cardizem drip. While I was in the room, she was starting to break into normal sinus rhythm.  Unfortunately she will need partial colectomy with possible colostomy as she is failing conservative therapy. Will reassess cardiac situation that a.m. Should she be in normal sinus rhythm, will proceed to the operating room. This was discussed extensively with the patient's daughter. All questions were answered.

## 2015-03-05 ENCOUNTER — Encounter (HOSPITAL_COMMUNITY): Payer: Self-pay | Admitting: Anesthesiology

## 2015-03-05 ENCOUNTER — Inpatient Hospital Stay (HOSPITAL_COMMUNITY): Payer: Medicare Other | Admitting: Anesthesiology

## 2015-03-05 ENCOUNTER — Inpatient Hospital Stay (HOSPITAL_COMMUNITY): Payer: Medicare Other

## 2015-03-05 ENCOUNTER — Encounter (HOSPITAL_COMMUNITY)
Admission: EM | Disposition: A | Discharge status: Skilled Nursing Facility | Payer: Self-pay | Source: Home / Self Care | Attending: Internal Medicine

## 2015-03-05 HISTORY — PX: COLECTOMY WITH COLOSTOMY CREATION/HARTMANN PROCEDURE: SHX6598

## 2015-03-05 HISTORY — PX: CARDIAC CATHETERIZATION: SHX172

## 2015-03-05 LAB — ABO/RH: ABO/RH(D): O POS

## 2015-03-05 LAB — BASIC METABOLIC PANEL WITH GFR
Anion gap: 8 (ref 5–15)
BUN: 33 mg/dL — ABNORMAL HIGH (ref 6–20)
CO2: 19 mmol/L — ABNORMAL LOW (ref 22–32)
Calcium: 7 mg/dL — ABNORMAL LOW (ref 8.9–10.3)
Chloride: 114 mmol/L — ABNORMAL HIGH (ref 101–111)
Creatinine, Ser: 1.3 mg/dL — ABNORMAL HIGH (ref 0.44–1.00)
GFR calc Af Amer: 44 mL/min — ABNORMAL LOW
GFR calc non Af Amer: 38 mL/min — ABNORMAL LOW
Glucose, Bld: 121 mg/dL — ABNORMAL HIGH (ref 65–99)
Potassium: 3.7 mmol/L (ref 3.5–5.1)
Sodium: 141 mmol/L (ref 135–145)

## 2015-03-05 LAB — CBC
HCT: 33.4 % — ABNORMAL LOW (ref 36.0–46.0)
Hemoglobin: 10.7 g/dL — ABNORMAL LOW (ref 12.0–15.0)
MCH: 26.8 pg (ref 26.0–34.0)
MCHC: 32 g/dL (ref 30.0–36.0)
MCV: 83.5 fL (ref 78.0–100.0)
Platelets: 154 10*3/uL (ref 150–400)
RBC: 4 MIL/uL (ref 3.87–5.11)
RDW: 15.1 % (ref 11.5–15.5)
WBC: 9.8 10*3/uL (ref 4.0–10.5)

## 2015-03-05 LAB — PHOSPHORUS: Phosphorus: 2.3 mg/dL — ABNORMAL LOW (ref 2.5–4.6)

## 2015-03-05 LAB — MAGNESIUM: Magnesium: 2.1 mg/dL (ref 1.7–2.4)

## 2015-03-05 LAB — TROPONIN I: Troponin I: 0.05 ng/mL — ABNORMAL HIGH

## 2015-03-05 LAB — PREPARE RBC (CROSSMATCH)

## 2015-03-05 SURGERY — COLECTOMY, WITH COLOSTOMY CREATION
Anesthesia: General | Site: Chest

## 2015-03-05 MED ORDER — LACTATED RINGERS IV SOLN
INTRAVENOUS | Status: DC | PRN
  Administered 2015-03-05: 10:00:00 via INTRAVENOUS

## 2015-03-05 MED ORDER — ONDANSETRON HCL 4 MG/2ML IJ SOLN
INTRAMUSCULAR | Status: DC | PRN
  Administered 2015-03-05: 4 mg via INTRAVENOUS

## 2015-03-05 MED ORDER — NEOSTIGMINE METHYLSULFATE 10 MG/10ML IV SOLN
INTRAVENOUS | Status: DC | PRN
  Administered 2015-03-05: 3 mg via INTRAVENOUS

## 2015-03-05 MED ORDER — LIDOCAINE HCL (CARDIAC) 20 MG/ML IV SOLN
INTRAVENOUS | Status: DC | PRN
  Administered 2015-03-05: 30 mg via INTRAVENOUS

## 2015-03-05 MED ORDER — KCL IN DEXTROSE-NACL 20-5-0.45 MEQ/L-%-% IV SOLN
INTRAVENOUS | Status: DC
  Administered 2015-03-05: 1000 mL via INTRAVENOUS

## 2015-03-05 MED ORDER — KETOROLAC TROMETHAMINE 30 MG/ML IJ SOLN
30.0000 mg | Freq: Once | INTRAMUSCULAR | Status: AC
  Administered 2015-03-05: 30 mg via INTRAVENOUS
  Filled 2015-03-05: qty 1

## 2015-03-05 MED ORDER — PROPOFOL 10 MG/ML IV BOLUS
INTRAVENOUS | Status: DC | PRN
  Administered 2015-03-05: 110 mg via INTRAVENOUS

## 2015-03-05 MED ORDER — SUCCINYLCHOLINE CHLORIDE 20 MG/ML IJ SOLN
INTRAMUSCULAR | Status: DC | PRN
  Administered 2015-03-05: 100 mg via INTRAVENOUS

## 2015-03-05 MED ORDER — SODIUM CHLORIDE 0.9 % IJ SOLN: 10.0000 mL | INTRAMUSCULAR | Status: DC | PRN

## 2015-03-05 MED ORDER — PANTOPRAZOLE SODIUM 40 MG IV SOLR
40.0000 mg | INTRAVENOUS | Status: DC
  Administered 2015-03-05 – 2015-03-07 (×3): 40 mg via INTRAVENOUS
  Filled 2015-03-05 (×3): qty 40

## 2015-03-05 MED ORDER — SODIUM CHLORIDE 0.9 % IJ SOLN
10.0000 mL | Freq: Two times a day (BID) | INTRAMUSCULAR | Status: DC
  Administered 2015-03-05 – 2015-03-09 (×4): 10 mL

## 2015-03-05 MED ORDER — FENTANYL CITRATE (PF) 250 MCG/5ML IJ SOLN
INTRAMUSCULAR | Status: DC | PRN
  Administered 2015-03-05 (×4): 50 ug via INTRAVENOUS

## 2015-03-05 MED ORDER — MAGNESIUM HYDROXIDE 400 MG/5ML PO SUSP: 30.0000 mL | Freq: Every day | ORAL | Status: DC | PRN

## 2015-03-05 MED ORDER — LACTATED RINGERS IV SOLN
INTRAVENOUS | Status: DC | PRN
  Administered 2015-03-05 (×2): via INTRAVENOUS

## 2015-03-05 MED ORDER — GLYCOPYRROLATE 0.2 MG/ML IJ SOLN
INTRAMUSCULAR | Status: DC | PRN
  Administered 2015-03-05: .5 mg via INTRAVENOUS

## 2015-03-05 MED ORDER — 0.9 % SODIUM CHLORIDE (POUR BTL) OPTIME
TOPICAL | Status: DC | PRN
  Administered 2015-03-05 (×4): 1000 mL

## 2015-03-05 MED ORDER — HYDRALAZINE HCL 20 MG/ML IJ SOLN
10.0000 mg | Freq: Four times a day (QID) | INTRAMUSCULAR | Status: DC | PRN
  Administered 2015-03-07 – 2015-03-08 (×3): 10 mg via INTRAVENOUS
  Filled 2015-03-05 (×3): qty 1

## 2015-03-05 MED ORDER — POTASSIUM CHLORIDE 2 MEQ/ML IV SOLN: INTRAVENOUS | Status: DC

## 2015-03-05 MED ORDER — LACTATED RINGERS IV SOLN
INTRAVENOUS | Status: DC
  Administered 2015-03-05: 1000 mL via INTRAVENOUS

## 2015-03-05 MED ORDER — ROCURONIUM BROMIDE 100 MG/10ML IV SOLN
INTRAVENOUS | Status: DC | PRN
  Administered 2015-03-05 (×2): 5 mg via INTRAVENOUS
  Administered 2015-03-05: 25 mg via INTRAVENOUS

## 2015-03-05 MED ORDER — SIMETHICONE 80 MG PO CHEW
40.0000 mg | CHEWABLE_TABLET | Freq: Four times a day (QID) | ORAL | Status: DC | PRN
  Administered 2015-03-05: 40 mg via ORAL
  Filled 2015-03-05: qty 1

## 2015-03-05 MED ORDER — SODIUM CHLORIDE 0.9 % IJ SOLN
INTRAMUSCULAR | Status: AC
  Filled 2015-03-05: qty 20

## 2015-03-05 MED ORDER — POVIDONE-IODINE 10 % EX OINT
TOPICAL_OINTMENT | CUTANEOUS | Status: AC
  Filled 2015-03-05: qty 1

## 2015-03-05 MED ORDER — LORAZEPAM 2 MG/ML IJ SOLN
1.0000 mg | INTRAMUSCULAR | Status: DC | PRN
  Administered 2015-03-06 (×3): 1 mg via INTRAVENOUS
  Filled 2015-03-05 (×3): qty 1

## 2015-03-05 MED ORDER — HYDROMORPHONE HCL 1 MG/ML IJ SOLN
1.0000 mg | INTRAMUSCULAR | Status: DC | PRN
  Administered 2015-03-07 – 2015-03-09 (×7): 1 mg via INTRAVENOUS
  Filled 2015-03-05 (×7): qty 1

## 2015-03-05 MED ORDER — MIDAZOLAM HCL 5 MG/5ML IJ SOLN
INTRAMUSCULAR | Status: DC | PRN
  Administered 2015-03-05 (×2): 1 mg via INTRAVENOUS

## 2015-03-05 SURGICAL SUPPLY — 55 items
APPLIER CLIP 13 LRG OPEN (CLIP) ×4
APR CLP LRG 13 20 CLIP (CLIP) ×2
BAG HAMPER (MISCELLANEOUS) ×2 IMPLANT
BARRIER SKIN 2 1/4 (WOUND CARE) ×3 IMPLANT
BARRIER SKIN 2 1/4INCH (WOUND CARE) ×1
BARRIER SKIN OD1.75 2 1/4 FLNG (WOUND CARE) IMPLANT
BRR SKN FLT 1.75X2.25 2 PC (WOUND CARE) ×2
CLIP APPLIE 13 LRG OPEN (CLIP) IMPLANT
CLOTH BEACON ORANGE TIMEOUT ST (SAFETY) ×2 IMPLANT
COVER LIGHT HANDLE STERIS (MISCELLANEOUS) ×4 IMPLANT
COVER MAYO STAND XLG (DRAPE) ×2 IMPLANT
DRAPE UTILITY W/TAPE 26X15 (DRAPES) ×4 IMPLANT
DRAPE WARM FLUID 44X44 (DRAPE) ×2 IMPLANT
DRSG OPSITE POSTOP 4X10 (GAUZE/BANDAGES/DRESSINGS) ×2 IMPLANT
ELECT BLADE 6 FLAT ULTRCLN (ELECTRODE) ×2 IMPLANT
ELECT REM PT RETURN 9FT ADLT (ELECTROSURGICAL) ×4
ELECTRODE REM PT RTRN 9FT ADLT (ELECTROSURGICAL) IMPLANT
EVACUATOR DRAINAGE 10X20 100CC (DRAIN) IMPLANT
EVACUATOR SILICONE 100CC (DRAIN) ×4
GLOVE BIOGEL PI IND STRL 7.0 (GLOVE) IMPLANT
GLOVE BIOGEL PI IND STRL 8 (GLOVE) IMPLANT
GLOVE BIOGEL PI INDICATOR 7.0 (GLOVE) ×6
GLOVE BIOGEL PI INDICATOR 8 (GLOVE) ×2
GLOVE EXAM NITRILE MD LF STRL (GLOVE) ×2 IMPLANT
GLOVE SURG SS PI 7.5 STRL IVOR (GLOVE) ×8 IMPLANT
GOWN STRL REUS W/TWL LRG LVL3 (GOWN DISPOSABLE) ×6 IMPLANT
INST SET MAJOR GENERAL (KITS) ×2 IMPLANT
KIT BLADEGUARD II DBL (SET/KITS/TRAYS/PACK) ×2 IMPLANT
LIGASURE IMPACT 36 18CM CVD LR (INSTRUMENTS) ×2 IMPLANT
MANIFOLD NEPTUNE II (INSTRUMENTS) ×2 IMPLANT
NS IRRIG 1000ML POUR BTL (IV SOLUTION) ×8 IMPLANT
PACK ABDOMINAL MAJOR (CUSTOM PROCEDURE TRAY) ×2 IMPLANT
PAD ARMBOARD 7.5X6 YLW CONV (MISCELLANEOUS) ×2 IMPLANT
PENCIL HANDSWITCHING (ELECTRODE) ×2 IMPLANT
POUCH OSTOMY 2 PC DRNBL 2.25 (WOUND CARE) IMPLANT
POUCH OSTOMY DRNBL 2 1/4 (WOUND CARE) ×4
RELOAD PROXIMATE 75MM BLUE (ENDOMECHANICALS) ×4 IMPLANT
RELOAD STAPLE 75 3.8 BLU REG (ENDOMECHANICALS) IMPLANT
RETRACTOR WND ALEXIS 25 LRG (MISCELLANEOUS) IMPLANT
RTRCTR WOUND ALEXIS 25CM LRG (MISCELLANEOUS) ×4
SET BASIN LINEN APH (SET/KITS/TRAYS/PACK) ×2 IMPLANT
SPONGE DRAIN TRACH 4X4 STRL 2S (GAUZE/BANDAGES/DRESSINGS) ×2 IMPLANT
SPONGE LAP 18X18 X RAY DECT (DISPOSABLE) ×8 IMPLANT
STAPLER GUN LINEAR PROX 60 (STAPLE) ×2 IMPLANT
STAPLER PROXIMATE 75MM BLUE (STAPLE) ×4 IMPLANT
STAPLER VISISTAT (STAPLE) ×2 IMPLANT
SUT CHROMIC 0 SH (SUTURE) ×2 IMPLANT
SUT CHROMIC 2 0 SH (SUTURE) ×2 IMPLANT
SUT CHROMIC 3 0 SH 27 (SUTURE) ×8 IMPLANT
SUT ETHILON 3 0 FSL (SUTURE) ×2 IMPLANT
SUT PDS AB 0 CTX 60 (SUTURE) ×4 IMPLANT
SUT PROLENE 2 0 SH 30 (SUTURE) ×2 IMPLANT
TOWEL BLUE STERILE X RAY DET (MISCELLANEOUS) IMPLANT
TOWEL OR 17X26 4PK STRL BLUE (TOWEL DISPOSABLE) ×2 IMPLANT
YANKAUER SUCT BULB TIP NO VENT (SUCTIONS) ×2 IMPLANT

## 2015-03-05 NOTE — Progress Notes (Signed)
Arrived back on flow and settled. Connected all lines and IV access ordered.

## 2015-03-05 NOTE — Progress Notes (Signed)
Transported to pre-op on our bed without incident.

## 2015-03-05 NOTE — Transfer of Care (Signed)
Immediate Anesthesia Transfer of Care Note  Patient: [name]  Procedure(s) Performed: Procedure(s): PARTIAL COLECTOMY WITH COLOSTOMY CREATION/HARTMANN PROCEDURE (N/A) CENTRAL LINE INSERTION (Left)  Patient Location: PACU  Anesthesia Type:General  Level of Consciousness: awake  Airway & Oxygen Therapy: Patient Spontanous Breathing and Patient connected to face mask oxygen  Post-op Assessment: Report given to RN and Post -op Vital signs reviewed and stable  Post vital signs: Reviewed and stable  Last Vitals:  Filed Vitals:   03/05/15 0600 03/05/15 0800  BP: 171/102 160/88  Pulse: 98 99  Temp: 37.8 C 37.7 C  Resp: 21 28    Complications: No apparent anesthesia complications

## 2015-03-05 NOTE — Anesthesia Postprocedure Evaluation (Signed)
Anesthesia Post Note  Patient: [name]  Procedure(s) Performed: Procedure(s) (LRB): PARTIAL COLECTOMY WITH COLOSTOMY CREATION/HARTMANN PROCEDURE (N/A) CENTRAL LINE INSERTION (Left)  Patient location during evaluation: PACU Anesthesia Type: General Level of consciousness: awake and alert (Initially confused, clearing now.) Pain management: pain level controlled Vital Signs Assessment: post-procedure vital signs reviewed and stable Respiratory status: spontaneous breathing and patient connected to nasal cannula oxygen Cardiovascular status: blood pressure returned to baseline and stable Postop Assessment: no signs of nausea or vomiting Anesthetic complications: no    Last Vitals:  Filed Vitals:   03/05/15 0800 03/05/15 1245  BP: 160/88   Pulse: 99   Temp: 37.7 C 37.1 C  Resp: 28     Last Pain:  Filed Vitals:   03/05/15 1256  PainSc: 0-No pain                 Claudett Bayly

## 2015-03-05 NOTE — Anesthesia Preprocedure Evaluation (Addendum)
Anesthesia Evaluation  Patient identified by MRN, date of birth, ID band Patient awake    Reviewed: Allergy & Precautions, NPO status   History of Anesthesia Complications Negative for: history of anesthetic complications  Airway Mallampati: II  TM Distance: >3 FB     Dental  (+) Teeth Intact, Caps   Pulmonary former smoker,    breath sounds clear to auscultation       Cardiovascular hypertension, + CAD and + Cardiac Stents  + dysrhythmias Atrial Fibrillation  Rhythm:Regular Rate:Normal  History a-fib, now SR with PAC.s and PVC.s.  On cardizem drip 7.5   Neuro/Psych    GI/Hepatic   Endo/Other    Renal/GU History renal failure     Musculoskeletal   Abdominal   Peds  Hematology   Anesthesia Other Findings   Reproductive/Obstetrics                           Anesthesia Physical Anesthesia Plan  ASA: III and emergent  Anesthesia Plan: General   Post-op Pain Management:    Induction: Intravenous and Rapid sequence  Airway Management Planned: Oral ETT  Additional Equipment:   Intra-op Plan:   Post-operative Plan:   Informed Consent: I have reviewed the patients History and Physical, chart, labs and discussed the procedure including the risks, benefits and alternatives for the proposed anesthesia with the patient or authorized representative who has indicated his/her understanding and acceptance.   Dental advisory given  Plan Discussed with:   Anesthesia Plan Comments: (Discussed possible post op intubation.  Patient understands.)        Anesthesia Quick Evaluation

## 2015-03-05 NOTE — Op Note (Signed)
Patient:  [name]  DOB:  1934-06-21  MRN:  [id number]   Preop Diagnosis:  Perforated colon  Postop Diagnosis:  Same  Procedure:  Partial colectomy with colostomy (Hartman's procedure), Central line placement  Surgeon:  Aviva Signs, M.D.  Anes:  Gen. endotracheal  Indications:  Patient is a 80 year old white female who presented with one-week course. Worsening lower abdominal pain. She was initially admitted to the hospital on 03/03/2015 was found to have perforated sigmoid colon, with mostly air in the retroperitoneal mesentery. No free fluid or intra-abdominal abscess was noted. She did not have peritoneal signs. Surgical management was tried, but the patient started having cardiac arrhythmias. Those have since stabilized the patient now comes the operating room for exploratory laparotomy, partial colectomy with possible colostomy, Central line placement. The risks and benefits of the procedures including bleeding, infection, cardiopulmonary difficulties, the possibility of a blood transfusion, and the possibility of being on a ventilator postoperatively were fully explained to the patient and family, who gave informed consent.  Procedure note:  The patient was placed the supine position. After induction of general endotracheal anesthesia, the abdomen was prepped and draped using usual sterile technique with ChloraPrep. Surgical site confirmation was performed.  A midline incision was made from the umbilicus to the suprapubic region without difficulty. The peritoneal cavity was entered into without difficulty. Cloudy fluid was noted within the pelvis. On inspection, the sigmoid colon mesentery was noted to be necrotic and full of stool. Mesentery was opened during the dissection, stool was noted to be spilling out. This spillage was isolated to the left lower quadrant and pelvic regions using lap pads. A GIA stapler was placed across the proximal sigmoid colon and fired. A TA 60 stapler was  placed across the colorectal juncture and fired. The mesentery was scored and once all the stool had been removed, the mesentery was divided using the LigaSure. The specimen was then removed from the operative field and sent to pathology further examination. Care was taken to avoid the left ureter. Significant inflammatory response was noted in this region. The pelvis left lower quadrant with then copiously irrigated normal saline. A 2-0 Prolene suture was placed on the distal end of the Hartman's pouch. A colostomy was then formed to the left of the umbilicus. The proximal sigmoid colon was mobilized and brought out through this ostomy site. A #10 flat Jackson-Pratt drain was placed into the pelvis and brought out through separate stab wound to the right midline incision. It was secured to skin wall using 3-0 nylon interrupted suture. All operating personnel changed gowns and gloves. A new setup was then used. The fascia was reapproximated using an 0 PDS running suture. Subcutaneous layer was irrigated normal saline and the skin was closed loosely with staples with iodoform Nu Gauze placed into the subcutaneous tissue. A dry sterile dressing was then applied. The colostomy was matured using 3-0 chromic gut interrupted sutures. A colostomy appliance was then applied.  Next, a left subclavian vein triple-lumen catheter was inserted. The left subclavian region was prepped and draped using usual sterile technique with ChloraPrep. A needle is advanced into left subclavian vein using the Seldinger technique without difficulty. Guidewire was then inserted and a triple-lumen catheter was inserted over the wire. It was secured at the skin level at the 18 cm Ogden Handlin using 3-0 silk sutures. Good backflow blood was noted in all 3 ports. All 3 ports were flushed with saline. A dry sterile dressing was then applied.  All  tape and needle counts were correct at the end of both procedures. The patient was extubated in the  operating room and transferred to PACU in guarded condition.  Complications:  None  EBL:  150 mL  Specimen:  Sigmoid colon, staple line proximal  Drains: JP drain to pelvis

## 2015-03-05 NOTE — Progress Notes (Signed)
  Subjective: Patient denies any significant abdominal pain. Some nausea noted.  Objective: Vital signs in last 24 hours: Temp:  [99.9 F (37.7 C)-100.7 F (38.2 C)] 99.9 F (37.7 C) (12/04 0800) Pulse Rate:  [55-156] 99 (12/04 0800) Resp:  [14-28] 28 (12/04 0800) BP: (123-186)/(53-142) 160/88 mmHg (12/04 0800) SpO2:  [79 %-98 %] 93 % (12/04 0800) FiO2 (%):  [2 %-28 %] 2 % (12/04 0400) Weight:  [73.5 kg (162 lb 0.6 oz)-74.6 kg (164 lb 7.4 oz)] 73.5 kg (162 lb 0.6 oz) (12/04 0500) Last BM Date: 03/03/15 (per dtr Clarene Critchley)  Intake/Output from previous day: 12/03 0701 - 12/04 0700 In: 2448.1 [I.V.:2098.1; IV Piggyback:350] Out: 1250 [Urine:1250] Intake/Output this shift:    General appearance: alert, cooperative, appears stated age and no distress Resp: clear to auscultation bilaterally Cardio: Normal sinus rhythm with occasional PVCs. GI: Soft with tenderness noted in the left lateral lower quadrant palpation.  Lab Results:   Recent Labs  03/04/15 0633 03/05/15 0223  WBC 13.7* 9.8  HGB 11.0* 10.7*  HCT 35.9* 33.4*  PLT 182 154   BMET  Recent Labs  03/04/15 0633 03/05/15 0223  NA 140 141  K 4.6 3.7  CL 111 114*  CO2 22 19*  GLUCOSE 108* 121*  BUN 37* 33*  CREATININE 2.09* 1.30*  CALCIUM 7.1* 7.0*   PT/INR  Recent Labs  03/02/15 2045 03/03/15 0626  LABPROT 14.1 16.1*  INR 1.07 1.28    Studies/Results: Dg Chest Port 1 View  03/04/2015  CLINICAL DATA:  Hypoxia EXAM: PORTABLE CHEST 1 VIEW COMPARISON:  Two days ago FINDINGS: There is streaky right infrahilar lung opacity which is newly developed. No edema, effusion, or pneumothorax. Lung volumes are low. Chronic cardiomegaly with superimposed large hiatal hernia. IMPRESSION: New right middle lobe atelectasis or consolidation. Given gas-filled hiatal hernia, question underlying aspiration. Electronically Signed   By: Monte Fantasia M.D.   On: 03/04/2015 15:43    Anti-infectives: Anti-infectives    Start     Dose/Rate Route Frequency Ordered Stop   03/03/15 0900  piperacillin-tazobactam (ZOSYN) IVPB 3.375 g     3.375 g 12.5 mL/hr over 240 Minutes Intravenous Every 8 hours 03/03/15 0739     03/03/15 0100  piperacillin-tazobactam (ZOSYN) IVPB 3.375 g     3.375 g 100 mL/hr over 30 Minutes Intravenous  Once 03/03/15 0049 03/03/15 0121      Assessment/Plan: s/p Procedure(s): COLECTOMY WITH COLOSTOMY CREATION/HARTMANN PROCEDURE Impression: Patient has converted back to normal sinus rhythm. Pulmonary 2-D echo shows normal ejection fraction. Renal function has improved.  Plan: We will proceed with partial colectomy with possible colostomy this morning. The risks and benefits of the procedure including bleeding, infection, blood transfusion, cardiopulmonary difficulties, colostomy formation, and the possibility of remaining intubated postoperatively were fully explained to the patient, who gave informed consent.  LOS: 2 days    Koral Thaden A 03/05/2015

## 2015-03-05 NOTE — Anesthesia Procedure Notes (Signed)
Procedure Name: Intubation Date/Time: 03/05/2015 10:29 AM Performed by: Tressie Stalker E Pre-anesthesia Checklist: Patient identified, Patient being monitored, Timeout performed, Emergency Drugs available and Suction available Patient Re-evaluated:Patient Re-evaluated prior to inductionOxygen Delivery Method: Circle System Utilized Preoxygenation: Pre-oxygenation with 100% oxygen Intubation Type: IV induction, Rapid sequence and Cricoid Pressure applied Ventilation: Mask ventilation without difficulty Laryngoscope Size: Mac and 3 Grade View: Grade I Tube type: Oral Tube size: 7.0 mm Number of attempts: 1 Airway Equipment and Method: Stylet Placement Confirmation: ETT inserted through vocal cords under direct vision,  positive ETCO2 and breath sounds checked- equal and bilateral Secured at: 21 cm Tube secured with: Tape Dental Injury: Teeth and Oropharynx as per pre-operative assessment

## 2015-03-05 NOTE — Progress Notes (Signed)
[facility] PROGRESS NOTE  [name] [id number] DOB: [birth year] DOA: 03/02/2015 PCP: Terald Sleeper, PA-C  Assessment/Plan: 1. Perforated sigmoid colon, revealed on CT A/P 12/2. Dr. Arnoldo Morale following closely and will perform partial colectomy this morning given her HR is stable. Lactic acid has normalized following IVF. WBC wnl, currently afebrile but did have a temp of 100.7 overnight. Continue abx, antiemetics, and pain management.  2. AKI superimposed on CKD stage 3, likely due to IV contrast. Improved with IVF. Will continue current treatments. 3. Non-anion gap metabolic acidosis, likely related to hyperchloremia. Will change fluids to D5 1/2 NS.  4. Atrial flutter. HR controlled and patient is now in SR following Cardizem infusion. Her BP is stable. Troponin 0.05. ECHO pending. Will continue Cardizem infusion for now and transition to PO after surgery.  5. GERD, continue PPI. 6. Essential HTN,  BP elevated. Continue Cardizem for now and PRN Hydralazine.  7. CAD, stable. Continue ASA  8. HLD, continue statin.   Code Status: Full DVT prophylaxis: Lovenox Family Communication:   Discussed with patient who understands and has no concerns at this time.   Disposition Plan: Discharge once improved   Consultants:  General surgery  Procedures:    Antibiotics:  Zosyn 12/2>>  HPI/Subjective:  Has continued abdominal pain but does not report that it is any worse. Had some vomiting, continues with nausea. Denies any SOB.   Objective: Filed Vitals:   03/05/15 0545 03/05/15 0600  BP: 169/79 171/102  Pulse: 81 98  Temp: 100.1 F (37.8 C) 100.1 F (37.8 C)  Resp: 21 21    Intake/Output Summary (Last 24 hours) at 03/05/15 0741 Last data filed at 03/05/15 0600  Gross per 24 hour  Intake 2448.08 ml  Output   1250 ml  Net 1198.08 ml   Filed Weights   03/04/15 0304 03/04/15 1400 03/05/15 0500  Weight: 69 kg (152 lb 1.9 oz) 74.6 kg (164 lb 7.4 oz) 73.5 kg (162 lb 0.6  oz)    Exam:  General: NAD, looks comfortable,  Cardiovascular: regular with occasional PVCs Respiratory: clear bilaterally, No wheezing, rales or rhonchi Abdomen: soft, Diffuse TTP,  no distention, bowel sounds normal Musculoskeletal: No edema b/l   Data Reviewed: Basic Metabolic Panel:  Recent Labs Lab 03/02/15 2045 03/03/15 0626 03/04/15 0633 03/05/15 0223  NA 137 140 140 141  K 3.3* 3.4* 4.6 3.7  CL 102 110 111 114*  CO2 21* 21* 22 19*  GLUCOSE 121* 124* 108* 121*  BUN 17 16 37* 33*  CREATININE 1.29* 1.23* 2.09* 1.30*  CALCIUM 9.3 7.4* 7.1* 7.0*  MG  --   --  1.7 2.1  PHOS  --   --  4.7* 2.3*   Liver Function Tests:  Recent Labs Lab 03/02/15 2045  AST 21  ALT 14  ALKPHOS 59  BILITOT 0.9  PROT 7.1  ALBUMIN 4.5    Recent Labs Lab 03/02/15 2045  LIPASE 24   CBC:  Recent Labs Lab 03/02/15 2045 03/03/15 03/03/15 0626 03/04/15 0633 03/05/15 0223  WBC 13.2*  --  10.1 13.7* 9.8  NEUTROABS 10.8*  --   --   --   --   HGB 12.8 13.1 12.3 11.0* 10.7*  HCT 38.7 39.7 37.4 35.9* 33.4*  MCV 82.2  --  83.3 86.5 83.5  PLT 189  --  172 182 154   Cardiac Enzymes:  Recent Labs Lab 03/02/15 2045 03/04/15 1512 03/04/15 2128 03/05/15 0223  TROPONINI <0.03 0.03 0.05* 0.05*  BNP (last 3 results)  Recent Labs  05/08/14 0315  BNP 182.8*      Recent Results (from the past 240 hour(s))  Culture, blood (routine x 2)     Status: None (Preliminary result)   Collection Time: 03/02/15 11:10 PM  Result Value Ref Range Status   Specimen Description BLOOD RIGHT HAND  Final   Special Requests BOTTLES DRAWN AEROBIC AND ANAEROBIC 4CC EACH  Final   Culture NO GROWTH 2 DAYS  Final   Report Status PENDING  Incomplete  Culture, blood (routine x 2)     Status: None (Preliminary result)   Collection Time: 03/02/15 11:23 PM  Result Value Ref Range Status   Specimen Description BLOOD LEFT FOREARM  Final   Special Requests   Final    BOTTLES DRAWN AEROBIC AND  ANAEROBIC AEB 4CC ANA 2CC   Culture NO GROWTH 2 DAYS  Final   Report Status PENDING  Incomplete  Surgical pcr screen     Status: None   Collection Time: 03/03/15  2:45 AM  Result Value Ref Range Status   MRSA, PCR NEGATIVE NEGATIVE Final   Staphylococcus aureus NEGATIVE NEGATIVE Final    Comment:        The Xpert SA Assay (FDA approved for NASAL specimens in patients over 78 years of age), is one component of a comprehensive surveillance program.  Test performance has been validated by [facility] for patients greater than or equal to 78 year old. It is not intended to diagnose infection nor to guide or monitor treatment.   MRSA PCR Screening     Status: None   Collection Time: 03/04/15  1:43 PM  Result Value Ref Range Status   MRSA by PCR NEGATIVE NEGATIVE Final    Comment:        The GeneXpert MRSA Assay (FDA approved for NASAL specimens only), is one component of a comprehensive MRSA colonization surveillance program. It is not intended to diagnose MRSA infection nor to guide or monitor treatment for MRSA infections.      Studies: Dg Chest Port 1 View  03/04/2015  CLINICAL DATA:  Hypoxia EXAM: PORTABLE CHEST 1 VIEW COMPARISON:  Two days ago FINDINGS: There is streaky right infrahilar lung opacity which is newly developed. No edema, effusion, or pneumothorax. Lung volumes are low. Chronic cardiomegaly with superimposed large hiatal hernia. IMPRESSION: New right middle lobe atelectasis or consolidation. Given gas-filled hiatal hernia, question underlying aspiration. Electronically Signed   By: Monte Fantasia M.D.   On: 03/04/2015 15:43    Scheduled Meds: . acetaminophen  1,000 mg Intravenous 4 times per day  . enoxaparin (LOVENOX) injection  30 mg Subcutaneous Q24H  . magnesium citrate  1 Bottle Oral Once  . piperacillin-tazobactam (ZOSYN)  IV  3.375 g Intravenous Q8H  . sodium chloride  3 mL Intravenous Q12H   Continuous Infusions: . sodium chloride 125 mL/hr at  03/05/15 0600  . diltiazem (CARDIZEM) infusion 7.5 mg/hr (03/05/15 0600)    Principal Problem:   Perforated sigmoid colon (HCC) Active Problems:   HYPERCHOLESTEROLEMIA   Anxiety state   DIASTOLIC DYSFUNCTION   GERD   HTN (hypertension)   Coronary artery disease   Abdominal pain   Lactic acidosis   Prolonged Q-T interval on ECG   CKD (chronic kidney disease) stage 3, GFR 30-59 ml/min   AKI (acute kidney injury) ([place])    Time spent: 25 minutes   Jehanzeb Memon. MD  [facility] Pager [phone]. If 7PM-7AM, please contact night-coverage  at [email or web address], password [facility] 03/05/2015, 7:41 AM  LOS: 2 days     By signing my name below, I, Rosalie Doctor, attest that this documentation has been prepared under the direction and in the presence of [facility]. MD Electronically Signed: Rosalie Doctor, Scribe. 03/05/2015 8:31am  I, Dr. Kathie Dike, personally performed the services described in this documentaiton. All medical record entries made by the scribe were at my direction and in my presence. I have reviewed the chart and agree that the record reflects my personal performance and is accurate and complete  Kathie Dike, MD, 03/05/2015 8:58 AM

## 2015-03-06 DIAGNOSIS — I4892 Unspecified atrial flutter: Secondary | ICD-10-CM

## 2015-03-06 LAB — COMPREHENSIVE METABOLIC PANEL
ALK PHOS: 65 U/L (ref 38–126)
ALT: 19 U/L (ref 14–54)
AST: 24 U/L (ref 15–41)
Albumin: 2.3 g/dL — ABNORMAL LOW (ref 3.5–5.0)
Anion gap: 8 (ref 5–15)
BUN: 20 mg/dL (ref 6–20)
CALCIUM: 7.3 mg/dL — AB (ref 8.9–10.3)
CO2: 21 mmol/L — AB (ref 22–32)
CREATININE: 0.92 mg/dL (ref 0.44–1.00)
Chloride: 111 mmol/L (ref 101–111)
GFR, EST NON AFRICAN AMERICAN: 58 mL/min — AB (ref >60)
Glucose, Bld: 123 mg/dL — ABNORMAL HIGH (ref 65–99)
Potassium: 2.7 mmol/L — CL (ref 3.5–5.1)
Sodium: 140 mmol/L (ref 135–145)
Total Bilirubin: 0.9 mg/dL (ref 0.3–1.2)
Total Protein: 5.3 g/dL — ABNORMAL LOW (ref 6.5–8.1)

## 2015-03-06 LAB — TYPE AND SCREEN
ABO/RH(D): O POS
ANTIBODY SCREEN: NEGATIVE
UNIT DIVISION: 0
Unit division: 0

## 2015-03-06 LAB — BASIC METABOLIC PANEL
Anion gap: 5 (ref 5–15)
BUN: 20 mg/dL (ref 6–20)
CALCIUM: 7 mg/dL — AB (ref 8.9–10.3)
CO2: 22 mmol/L (ref 22–32)
CREATININE: 0.91 mg/dL (ref 0.44–1.00)
Chloride: 112 mmol/L — ABNORMAL HIGH (ref 101–111)
GFR calc Af Amer: >60 mL/min (ref >60)
GFR, EST NON AFRICAN AMERICAN: 59 mL/min — AB (ref >60)
GLUCOSE: 123 mg/dL — AB (ref 65–99)
Potassium: 2.9 mmol/L — ABNORMAL LOW (ref 3.5–5.1)
Sodium: 139 mmol/L (ref 135–145)

## 2015-03-06 LAB — MAGNESIUM
MAGNESIUM: 2.2 mg/dL (ref 1.7–2.4)
Magnesium: 2.2 mg/dL (ref 1.7–2.4)

## 2015-03-06 LAB — CBC
HEMATOCRIT: 28.6 % — AB (ref 36.0–46.0)
Hemoglobin: 9.5 g/dL — ABNORMAL LOW (ref 12.0–15.0)
MCH: 26.8 pg (ref 26.0–34.0)
MCHC: 33.2 g/dL (ref 30.0–36.0)
MCV: 80.8 fL (ref 78.0–100.0)
PLATELETS: 150 10*3/uL (ref 150–400)
RBC: 3.54 MIL/uL — ABNORMAL LOW (ref 3.87–5.11)
RDW: 14.6 % (ref 11.5–15.5)
WBC: 10.8 10*3/uL — AB (ref 4.0–10.5)

## 2015-03-06 LAB — PHOSPHORUS: Phosphorus: 1.5 mg/dL — ABNORMAL LOW (ref 2.5–4.6)

## 2015-03-06 LAB — POTASSIUM: POTASSIUM: 3.8 mmol/L (ref 3.5–5.1)

## 2015-03-06 MED ORDER — SODIUM CHLORIDE 0.9 % IJ SOLN
10.0000 mL | Freq: Two times a day (BID) | INTRAMUSCULAR | Status: DC
  Administered 2015-03-06: 10 mL
  Administered 2015-03-06: 13 mL
  Administered 2015-03-07 – 2015-03-10 (×4): 10 mL

## 2015-03-06 MED ORDER — POTASSIUM CHLORIDE CRYS ER 20 MEQ PO TBCR
40.0000 meq | EXTENDED_RELEASE_TABLET | ORAL | Status: AC
  Administered 2015-03-06: 40 meq via ORAL
  Filled 2015-03-06: qty 2

## 2015-03-06 MED ORDER — METOPROLOL TARTRATE 25 MG PO TABS: 25.0000 mg | ORAL_TABLET | Freq: Two times a day (BID) | ORAL | Status: DC

## 2015-03-06 MED ORDER — POTASSIUM CHLORIDE 10 MEQ/100ML IV SOLN
10.0000 meq | INTRAVENOUS | Status: AC
  Administered 2015-03-06 (×4): 10 meq via INTRAVENOUS
  Filled 2015-03-06 (×3): qty 100

## 2015-03-06 MED ORDER — POTASSIUM PHOSPHATES 15 MMOLE/5ML IV SOLN
20.0000 mmol | Freq: Once | INTRAVENOUS | Status: AC
  Administered 2015-03-06: 20 mmol via INTRAVENOUS
  Filled 2015-03-06: qty 6.67

## 2015-03-06 MED ORDER — POTASSIUM CHLORIDE CRYS ER 20 MEQ PO TBCR
40.0000 meq | EXTENDED_RELEASE_TABLET | ORAL | Status: DC
  Administered 2015-03-06: 40 meq via ORAL
  Filled 2015-03-06: qty 2

## 2015-03-06 MED ORDER — METOPROLOL TARTRATE 25 MG PO TABS
25.0000 mg | ORAL_TABLET | Freq: Four times a day (QID) | ORAL | Status: DC
  Administered 2015-03-06 – 2015-03-08 (×9): 25 mg via ORAL
  Filled 2015-03-06 (×9): qty 1

## 2015-03-06 MED ORDER — GUAIFENESIN ER 600 MG PO TB12
1200.0000 mg | ORAL_TABLET | Freq: Two times a day (BID) | ORAL | Status: DC
  Administered 2015-03-06 – 2015-03-14 (×17): 1200 mg via ORAL
  Filled 2015-03-06 (×17): qty 2

## 2015-03-06 MED ORDER — HALOPERIDOL LACTATE 5 MG/ML IJ SOLN: 5.0000 mg | Freq: Four times a day (QID) | INTRAMUSCULAR | Status: DC | PRN

## 2015-03-06 MED ORDER — KCL IN DEXTROSE-NACL 40-5-0.45 MEQ/L-%-% IV SOLN
INTRAVENOUS | Status: DC
  Administered 2015-03-06 – 2015-03-07 (×3): via INTRAVENOUS

## 2015-03-06 MED ORDER — ENOXAPARIN SODIUM 40 MG/0.4ML ~~LOC~~ SOLN
40.0000 mg | SUBCUTANEOUS | Status: DC
  Administered 2015-03-06 – 2015-03-07 (×2): 40 mg via SUBCUTANEOUS
  Filled 2015-03-06 (×2): qty 0.4

## 2015-03-06 MED ORDER — SODIUM CHLORIDE 0.9 % IJ SOLN: 10.0000 mL | INTRAMUSCULAR | Status: DC | PRN

## 2015-03-06 MED ORDER — POTASSIUM CHLORIDE 10 MEQ/100ML IV SOLN
INTRAVENOUS | Status: AC
  Filled 2015-03-06: qty 100

## 2015-03-06 NOTE — Consult Note (Signed)
WOC consulted for new ostomy.  Discussed patient with bedside nurse this am. Patient awake/alert.  [place] nurse will plan to make first teaching visit tom.  Bedside nurse in agreement.    [place], [place]

## 2015-03-06 NOTE — Progress Notes (Signed)
Dr. Roderic Palau notified of patients increased confusion and hallucinations. After ativan able to put blood pressure cuff back on and get vital signs. Tylenol given for temp of 100.4 as per Dr. Roderic Palau verbal order. Family called to come in. Daughter at bedside at this time.

## 2015-03-06 NOTE — Progress Notes (Signed)
MEDICATION RELATED CONSULT NOTE - INITIAL   Pharmacy Consult for Phosphorus Replacement Indication: Hypophosphatemia  Allergies  Allergen Reactions  . Sulfamethoxazole Rash    Patient Measurements: Height: 5\' 3"  (160 cm) Weight: 163 lb 12.8 oz (74.3 kg) IBW/kg (Calculated) : 52.4  Vital Signs: Temp: 99.7 F (37.6 C) (12/05 0600) BP: 149/135 mmHg (12/05 0600) Pulse Rate: 49 (12/05 0600) Intake/Output from previous day: 12/04 0701 - 12/05 0700 In: 2517.5 [I.V.:2452.5] Out: 2520 [Urine:2295; Drains:75; Blood:150] Intake/Output from this shift:    Labs:  Recent Labs  03/04/15 0633 03/05/15 0223 03/06/15 0040  WBC 13.7* 9.8 10.8*  HGB 11.0* 10.7* 9.5*  HCT 35.9* 33.4* 28.6*  PLT 182 154 150  CREATININE 2.09* 1.30* 0.91  MG 1.7 2.1 2.2  PHOS 4.7* 2.3* 1.5*   Estimated Creatinine Clearance: 49.2 mL/min (by C-G formula based on Cr of 0.91).   Microbiology: Recent Results (from the past 720 hour(s))  Culture, blood (routine x 2)     Status: None (Preliminary result)   Collection Time: 03/02/15 11:10 PM  Result Value Ref Range Status   Specimen Description BLOOD RIGHT HAND  Final   Special Requests BOTTLES DRAWN AEROBIC AND ANAEROBIC 4CC EACH  Final   Culture NO GROWTH 2 DAYS  Final   Report Status PENDING  Incomplete  Culture, blood (routine x 2)     Status: None (Preliminary result)   Collection Time: 03/02/15 11:23 PM  Result Value Ref Range Status   Specimen Description BLOOD LEFT FOREARM  Final   Special Requests   Final    BOTTLES DRAWN AEROBIC AND ANAEROBIC AEB 4CC ANA 2CC   Culture NO GROWTH 2 DAYS  Final   Report Status PENDING  Incomplete  Surgical pcr screen     Status: None   Collection Time: 03/03/15  2:45 AM  Result Value Ref Range Status   MRSA, PCR NEGATIVE NEGATIVE Final   Staphylococcus aureus NEGATIVE NEGATIVE Final    Comment:        The Xpert SA Assay (FDA approved for NASAL specimens in patients over 77 years of age), is one component  of a comprehensive surveillance program.  Test performance has been validated by [facility] for patients greater than or equal to 56 year old. It is not intended to diagnose infection nor to guide or monitor treatment.   MRSA PCR Screening     Status: None   Collection Time: 03/04/15  1:43 PM  Result Value Ref Range Status   MRSA by PCR NEGATIVE NEGATIVE Final    Comment:        The GeneXpert MRSA Assay (FDA approved for NASAL specimens only), is one component of a comprehensive MRSA colonization surveillance program. It is not intended to diagnose MRSA infection nor to guide or monitor treatment for MRSA infections.    Medical History: Past Medical History  Diagnosis Date  . Depressive disorder, not elsewhere classified   . Anxiety state, unspecified   . Pure hypercholesterolemia   . Esophageal reflux   . Infection of esophagostomy ([place])   . Congestive heart failure, unspecified     diastolic heart failure  . Pulmonary hypertension (HCC)     PA systolic pressure [id number] mmHg by echocardiogram, PA pressure 33/10 by cardiac catheterization PA saturation 62% thermodilution cardiac index 2.0 thick cardiac index 2.4  . Bradycardia     previously with coreg  . Tricuspid regurgitation     moderate tricuspid regurgitation by echocardiogram, prior use of anorexic agents  .  Melanoma ([place]) 1995    removal on back   . Coronary artery disease     a. Moderate to severe coronary disease involving the left anterior descending artery and right coronary artery.  Sequential stenosis in the LAD is significant.  Right coronary artery is moderate to severely likely nonischemic. Questionable small LVOT obstruction.  No Brockenbrough maneuver was performed.  Catheterization January 2013; b. 05/2014 MV no isch/infarct, EF 60%.  Marland Kitchen PONV (postoperative nausea and vomiting)   . Hypertension   . H/O hiatal hernia   . Gastritis   . [name]'s esophagus   . IBS (irritable bowel syndrome)   . Macular  degeneration   . Dysrhythmia    Assessment: 80yo female.  K+ and Phos low.  Pt is receiving 4 runs of KCL IV today. SCr has improved since admission. Estimated Creatinine Clearance: 49.2 mL/min (by C-G formula based on Cr of 0.91).  Goal of Therapy:  Replace K+ and Phos to WNL  Plan:  K-Phos 62mMols IV today x 1 over 6 hrs F/U labs in am  Hart Robinsons A 03/06/2015,7:52 AM

## 2015-03-06 NOTE — Progress Notes (Signed)
Paged Dr. Roderic Palau to notify that patient has increased confusion and is hallucinating. Notified that patient keeps trying to get out of bed and has been combative. Ativan has been given and patient is no longer combative but still tries to get out of bed. Security in room with patient at this time. Jomarie Longs, RN also given this information during shift report.

## 2015-03-06 NOTE — Progress Notes (Signed)
1 Day Post-Op  Subjective: Feels fine. No significant incisional pain noted.  Objective: Vital signs in last 24 hours: Temp:  [98.8 F (37.1 C)-99.9 F (37.7 C)] 99.7 F (37.6 C) (12/05 0600) Pulse Rate:  [42-126] 49 (12/05 0600) Resp:  [18-27] 23 (12/05 0600) BP: (112-180)/(64-165) 149/135 mmHg (12/05 0600) SpO2:  [95 %-100 %] 99 % (12/05 0600) Weight:  [74.3 kg (163 lb 12.8 oz)] 74.3 kg (163 lb 12.8 oz) (12/05 0500) Last BM Date:  (colostomy)  Intake/Output from previous day: 12/04 0701 - 12/05 0700 In: 2517.5 [I.V.:2452.5] Out: 2520 [Urine:2295; Drains:75; Blood:150] Intake/Output this shift:    General appearance: alert, cooperative and no distress Resp: clear to auscultation bilaterally Cardio: irregularly irregular rhythm GI: Soft. Dressing dry and intact. Ostomy pink. Minimal output noted.  Lab Results:   Recent Labs  03/05/15 0223 03/06/15 0040  WBC 9.8 10.8*  HGB 10.7* 9.5*  HCT 33.4* 28.6*  PLT 154 150   BMET  Recent Labs  03/06/15 0040 03/06/15 0801  NA 139 140  K 2.9* 2.7*  CL 112* 111  CO2 22 21*  GLUCOSE 123* 123*  BUN 20 20  CREATININE 0.91 0.92  CALCIUM 7.0* 7.3*   PT/INR No results for input(s): LABPROT, INR in the last 72 hours.  Studies/Results: Dg Chest Port 1 View  03/05/2015  CLINICAL DATA:  Central line placement. EXAM: PORTABLE CHEST 1 VIEW COMPARISON:  03/04/2015 and 03/02/2015. FINDINGS: 1304 hours. New left subclavian central venous catheter projected laterally at the mid SVC level near the origin of the azygos vein. The heart size and mediastinal contours are stable with a known hiatal hernia. There is no pneumothorax. Medial right basilar airspace disease appears unchanged. The left lung is clear. IMPRESSION: Central venous catheter tip at the mid SVC level near the origin of the azygos vein. No pneumothorax. No significant change in medial right basilar airspace disease. Electronically Signed   By: Richardean Sale M.D.   On:  03/05/2015 14:15   Dg Chest Port 1 View  03/04/2015  CLINICAL DATA:  Hypoxia EXAM: PORTABLE CHEST 1 VIEW COMPARISON:  Two days ago FINDINGS: There is streaky right infrahilar lung opacity which is newly developed. No edema, effusion, or pneumothorax. Lung volumes are low. Chronic cardiomegaly with superimposed large hiatal hernia. IMPRESSION: New right middle lobe atelectasis or consolidation. Given gas-filled hiatal hernia, question underlying aspiration. Electronically Signed   By: Monte Fantasia M.D.   On: 03/04/2015 15:43    Anti-infectives: Anti-infectives    Start     Dose/Rate Route Frequency Ordered Stop   03/03/15 0900  piperacillin-tazobactam (ZOSYN) IVPB 3.375 g     3.375 g 12.5 mL/hr over 240 Minutes Intravenous Every 8 hours 03/03/15 0739     03/03/15 0100  piperacillin-tazobactam (ZOSYN) IVPB 3.375 g     3.375 g 100 mL/hr over 30 Minutes Intravenous  Once 03/03/15 0049 03/03/15 0121      Assessment/Plan: s/p Procedure(s): PARTIAL COLECTOMY WITH COLOSTOMY CREATION/HARTMANN PROCEDURE CENTRAL LINE INSERTION Impression: Stable on postoperative day one. Patient does have hypokalemia and hypophosphatemia which will be addressed. Ostomy nurse has been consulted. Would defer anticoagulation for 24 more hours until hemoglobin is noted be stable. Renal insufficiency has resolved.  LOS: 3 days    [name] A 03/06/2015

## 2015-03-06 NOTE — Progress Notes (Signed)
ANTIBIOTIC CONSULT NOTE - follow up  Pharmacy Consult for Zosyn Indication: intra-abdominal infection  Allergies  Allergen Reactions  . Sulfamethoxazole Rash   Patient Measurements: Height: 5\' 3"  (160 cm) Weight: 163 lb 12.8 oz (74.3 kg) IBW/kg (Calculated) : 52.4  Vital Signs: Temp: 99.7 F (37.6 C) (12/05 0600) BP: 149/135 mmHg (12/05 0600) Pulse Rate: 49 (12/05 0600) Intake/Output from previous day: 12/04 0701 - 12/05 0700 In: 2517.5 [I.V.:2452.5] Out: 2520 [Urine:2295; Drains:75; Blood:150] Intake/Output from this shift:    Labs:  Recent Labs  03/04/15 0633 03/05/15 0223 03/06/15 0040  WBC 13.7* 9.8 10.8*  HGB 11.0* 10.7* 9.5*  PLT 182 154 150  CREATININE 2.09* 1.30* 0.91   Estimated Creatinine Clearance: 49.2 mL/min (by C-G formula based on Cr of 0.91). No results for input(s): VANCOTROUGH, VANCOPEAK, VANCORANDOM, GENTTROUGH, GENTPEAK, GENTRANDOM, TOBRATROUGH, TOBRAPEAK, TOBRARND, AMIKACINPEAK, AMIKACINTROU, AMIKACIN in the last 72 hours.   Microbiology: Recent Results (from the past 720 hour(s))  Culture, blood (routine x 2)     Status: None (Preliminary result)   Collection Time: 03/02/15 11:10 PM  Result Value Ref Range Status   Specimen Description BLOOD RIGHT HAND  Final   Special Requests BOTTLES DRAWN AEROBIC AND ANAEROBIC 4CC EACH  Final   Culture NO GROWTH 2 DAYS  Final   Report Status PENDING  Incomplete  Culture, blood (routine x 2)     Status: None (Preliminary result)   Collection Time: 03/02/15 11:23 PM  Result Value Ref Range Status   Specimen Description BLOOD LEFT FOREARM  Final   Special Requests   Final    BOTTLES DRAWN AEROBIC AND ANAEROBIC AEB 4CC ANA 2CC   Culture NO GROWTH 2 DAYS  Final   Report Status PENDING  Incomplete  Surgical pcr screen     Status: None   Collection Time: 03/03/15  2:45 AM  Result Value Ref Range Status   MRSA, PCR NEGATIVE NEGATIVE Final   Staphylococcus aureus NEGATIVE NEGATIVE Final    Comment:         The Xpert SA Assay (FDA approved for NASAL specimens in patients over 23 years of age), is one component of a comprehensive surveillance program.  Test performance has been validated by [facility] for patients greater than or equal to 47 year old. It is not intended to diagnose infection nor to guide or monitor treatment.   MRSA PCR Screening     Status: None   Collection Time: 03/04/15  1:43 PM  Result Value Ref Range Status   MRSA by PCR NEGATIVE NEGATIVE Final    Comment:        The GeneXpert MRSA Assay (FDA approved for NASAL specimens only), is one component of a comprehensive MRSA colonization surveillance program. It is not intended to diagnose MRSA infection nor to guide or monitor treatment for MRSA infections.    Medical History: Past Medical History  Diagnosis Date  . Depressive disorder, not elsewhere classified   . Anxiety state, unspecified   . Pure hypercholesterolemia   . Esophageal reflux   . Infection of esophagostomy ([place])   . Congestive heart failure, unspecified     diastolic heart failure  . Pulmonary hypertension (HCC)     PA systolic pressure [id number] mmHg by echocardiogram, PA pressure 33/10 by cardiac catheterization PA saturation 62% thermodilution cardiac index 2.0 thick cardiac index 2.4  . Bradycardia     previously with coreg  . Tricuspid regurgitation     moderate tricuspid regurgitation by echocardiogram, prior use  of anorexic agents  . Melanoma ([place]) 1995    removal on back   . Coronary artery disease     a. Moderate to severe coronary disease involving the left anterior descending artery and right coronary artery.  Sequential stenosis in the LAD is significant.  Right coronary artery is moderate to severely likely nonischemic. Questionable small LVOT obstruction.  No Brockenbrough maneuver was performed.  Catheterization January 2013; b. 05/2014 MV no isch/infarct, EF 60%.  Marland Kitchen PONV (postoperative nausea and vomiting)   . Hypertension    . H/O hiatal hernia   . Gastritis   . [name]'s esophagus   . IBS (irritable bowel syndrome)   . Macular degeneration   . Dysrhythmia    Anti-infectives    Start     Dose/Rate Route Frequency Ordered Stop   03/03/15 0900  piperacillin-tazobactam (ZOSYN) IVPB 3.375 g     3.375 g 12.5 mL/hr over 240 Minutes Intravenous Every 8 hours 03/03/15 0739     03/03/15 0100  piperacillin-tazobactam (ZOSYN) IVPB 3.375 g     3.375 g 100 mL/hr over 30 Minutes Intravenous  Once 03/03/15 0049 03/03/15 0121     Assessment: 80yo female with c/o abdominal pain in LLQ.  No fevers or chills reported.  CT shows perforated bowel.  Zosyn for intra-abdominal infection.  Afebrile.    Goal of Therapy:  Eradicate infection.  Plan:  Zosyn 3.375gm IV q8h, EID Monitor labs, renal fxn, progress and c/s  [place] Crane, Somaya Grassi A 03/06/2015,7:50 AM

## 2015-03-06 NOTE — Progress Notes (Addendum)
Paged Dr. Roderic Palau to notify of change in patient's mental status and that patient is hypertensive. Pulse 100. Temp 100.6 O2 99%. Resp 30. BP 160/120. Metoprolol given. Ativan given at 09:43.

## 2015-03-06 NOTE — Progress Notes (Signed)
[facility] PROGRESS NOTE  [name] [id number] DOB: July 21, 1934 DOA: 03/02/2015 PCP: Terald Sleeper, PA-C  Assessment/Plan: 1. Perforated sigmoid colon, revealed on CT A/P 12/2. Dr. Arnoldo Morale following closely and performed partial colectomy 12/04. Marland Kitchen Continue abx, antiemetics, and pain management.  2. AKI superimposed on CKD stage 3, likely due to IV contrast. Resolved with IVF. Continue current treatments. 3. Non-anion gap metabolic acidosis, likely related to hyperchloremia. Improved with switching fluids to D5 1/2 NS.  4. Atrial fibrillation.Currenlty on Cardizem infusion. Will start patient on oral Metoprolol and wean off Cardizem. Her BP remains stable. ECHO as below. Her chadsvasc score is 4.  Will start anticoagulation therapy when cleared by general surgery. 5. GERD, continue PPI. 6. Essential HTN,  BP stable. Continue PRN Hydralazine.  7. CAD, stable. Continue ASA  8. HLD, continue statin.   Code Status: Full DVT prophylaxis: Lovenox Family Communication:   Discussed with patient who understands and has no concerns at this time.   Disposition Plan: Anticipate discharge once improved.    Consultants:  General surgery  Procedures:  ECHO  Study Conclusions  - Left ventricle: The cavity size was normal. Systolic function was normal. The estimated ejection fraction was in the range of 55% to 60%. Wall motion was normal; there were no regional wall motion abnormalities. - Mitral valve: Mildly calcified annulus. There was mild regurgitation. - Left atrium: The atrium was moderately to severely dilated. - Pulmonary arteries: Systolic pressure was moderately increased. PA peak pressure: 59 mm Hg (S).  Antibiotics:  Zosyn 12/2>>  HPI/Subjective:  Feeling good abdominal pain has improved. Breathing is fine, still has a mild cough. Denies nausea and vomiting.   Objective: Filed Vitals:   03/06/15 0500 03/06/15 0600  BP: 177/165 149/135  Pulse: 86 49   Temp: 99.8 F (37.7 C) 99.7 F (37.6 C)  Resp: 22 23    Intake/Output Summary (Last 24 hours) at 03/06/15 0705 Last data filed at 03/06/15 0600  Gross per 24 hour  Intake 2517.5 ml  Output   2520 ml  Net   -2.5 ml   Filed Weights   03/04/15 1400 03/05/15 0500 03/06/15 0500  Weight: 74.6 kg (164 lb 7.4 oz) 73.5 kg (162 lb 0.6 oz) 74.3 kg (163 lb 12.8 oz)    Exam:  General: NAD. Appears calm and looks comfortable lying in bed.  Cardiovascular: Irregular  Respiratory: clear bilaterally, No wheezing, rales or rhonchi Abdomen: soft, no distention, diffused abd tenderness, sluggish bowel sounds. Colostomy noted. Musculoskeletal: No edema b/l   Data Reviewed: Basic Metabolic Panel:  Recent Labs Lab 03/02/15 2045 03/03/15 0626 03/04/15 0633 03/05/15 0223 03/06/15 0040  NA 137 140 140 141 139  K 3.3* 3.4* 4.6 3.7 2.9*  CL 102 110 111 114* 112*  CO2 21* 21* 22 19* 22  GLUCOSE 121* 124* 108* 121* 123*  BUN 17 16 37* 33* 20  CREATININE 1.29* 1.23* 2.09* 1.30* 0.91  CALCIUM 9.3 7.4* 7.1* 7.0* 7.0*  MG  --   --  1.7 2.1 2.2  PHOS  --   --  4.7* 2.3* 1.5*   Liver Function Tests:  Recent Labs Lab 03/02/15 2045  AST 21  ALT 14  ALKPHOS 59  BILITOT 0.9  PROT 7.1  ALBUMIN 4.5    Recent Labs Lab 03/02/15 2045  LIPASE 24   CBC:  Recent Labs Lab 03/02/15 2045 03/03/15 03/03/15 0626 03/04/15 0633 03/05/15 0223 03/06/15 0040  WBC 13.2*  --  10.1 13.7* 9.8  10.8*  NEUTROABS 10.8*  --   --   --   --   --   HGB 12.8 13.1 12.3 11.0* 10.7* 9.5*  HCT 38.7 39.7 37.4 35.9* 33.4* 28.6*  MCV 82.2  --  83.3 86.5 83.5 80.8  PLT 189  --  172 182 154 150   Cardiac Enzymes:  Recent Labs Lab 03/02/15 2045 03/04/15 1512 03/04/15 2128 03/05/15 0223  TROPONINI <0.03 0.03 0.05* 0.05*   BNP (last 3 results)  Recent Labs  05/08/14 0315  BNP 182.8*      Recent Results (from the past 240 hour(s))  Culture, blood (routine x 2)     Status: None (Preliminary  result)   Collection Time: 03/02/15 11:10 PM  Result Value Ref Range Status   Specimen Description BLOOD RIGHT HAND  Final   Special Requests BOTTLES DRAWN AEROBIC AND ANAEROBIC 4CC EACH  Final   Culture NO GROWTH 2 DAYS  Final   Report Status PENDING  Incomplete  Culture, blood (routine x 2)     Status: None (Preliminary result)   Collection Time: 03/02/15 11:23 PM  Result Value Ref Range Status   Specimen Description BLOOD LEFT FOREARM  Final   Special Requests   Final    BOTTLES DRAWN AEROBIC AND ANAEROBIC AEB 4CC ANA 2CC   Culture NO GROWTH 2 DAYS  Final   Report Status PENDING  Incomplete  Surgical pcr screen     Status: None   Collection Time: 03/03/15  2:45 AM  Result Value Ref Range Status   MRSA, PCR NEGATIVE NEGATIVE Final   Staphylococcus aureus NEGATIVE NEGATIVE Final    Comment:        The Xpert SA Assay (FDA approved for NASAL specimens in patients over 25 years of age), is one component of a comprehensive surveillance program.  Test performance has been validated by [facility] for patients greater than or equal to 19 year old. It is not intended to diagnose infection nor to guide or monitor treatment.   MRSA PCR Screening     Status: None   Collection Time: 03/04/15  1:43 PM  Result Value Ref Range Status   MRSA by PCR NEGATIVE NEGATIVE Final    Comment:        The GeneXpert MRSA Assay (FDA approved for NASAL specimens only), is one component of a comprehensive MRSA colonization surveillance program. It is not intended to diagnose MRSA infection nor to guide or monitor treatment for MRSA infections.      Studies: Dg Chest Port 1 View  03/05/2015  CLINICAL DATA:  Central line placement. EXAM: PORTABLE CHEST 1 VIEW COMPARISON:  03/04/2015 and 03/02/2015. FINDINGS: 1304 hours. New left subclavian central venous catheter projected laterally at the mid SVC level near the origin of the azygos vein. The heart size and mediastinal contours are stable with a  known hiatal hernia. There is no pneumothorax. Medial right basilar airspace disease appears unchanged. The left lung is clear. IMPRESSION: Central venous catheter tip at the mid SVC level near the origin of the azygos vein. No pneumothorax. No significant change in medial right basilar airspace disease. Electronically Signed   By: Richardean Sale M.D.   On: 03/05/2015 14:15   Dg Chest Port 1 View  03/04/2015  CLINICAL DATA:  Hypoxia EXAM: PORTABLE CHEST 1 VIEW COMPARISON:  Two days ago FINDINGS: There is streaky right infrahilar lung opacity which is newly developed. No edema, effusion, or pneumothorax. Lung volumes are low. Chronic cardiomegaly with  superimposed large hiatal hernia. IMPRESSION: New right middle lobe atelectasis or consolidation. Given gas-filled hiatal hernia, question underlying aspiration. Electronically Signed   By: Monte Fantasia M.D.   On: 03/04/2015 15:43    Scheduled Meds: . enoxaparin (LOVENOX) injection  30 mg Subcutaneous Q24H  . pantoprazole (PROTONIX) IV  40 mg Intravenous Q24H  . piperacillin-tazobactam (ZOSYN)  IV  3.375 g Intravenous Q8H  . sodium chloride  10-40 mL Intracatheter Q12H  . sodium chloride  3 mL Intravenous Q12H   Continuous Infusions: . diltiazem (CARDIZEM) infusion 7.5 mg/hr (03/06/15 0600)  . lactated ringers 1,000 mL (03/05/15 1422)    Principal Problem:   Perforated sigmoid colon ([place]) Active Problems:   HYPERCHOLESTEROLEMIA   Anxiety state   DIASTOLIC DYSFUNCTION   GERD   HTN (hypertension)   Coronary artery disease   Abdominal pain   Lactic acidosis   Prolonged Q-T interval on ECG   CKD (chronic kidney disease) stage 3, GFR 30-59 ml/min   AKI (acute kidney injury) ([place])   Atrial flutter ([place])    Time spent: 20  minutes   Jehanzeb Memon. MD  [facility] Pager [phone]. If 7PM-7AM, please contact night-coverage at [email or web address], password [facility] 03/06/2015, 7:05 AM  LOS: 3 days     By signing my name below, I,  Rennis Harding, attest that this documentation has been prepared under the direction and in the presence of Kathie Dike, MD. Electronically signed: Rennis Harding, Scribe. 03/06/2015 9:20am   I, Dr. Kathie Dike, personally performed the services described in this documentaiton. All medical record entries made by the scribe were at my direction and in my presence. I have reviewed the chart and agree that the record reflects my personal performance and is accurate and complete  Kathie Dike, MD, 03/06/2015 10:13 AM

## 2015-03-06 NOTE — Progress Notes (Signed)
CRITICAL VALUE ALERT  Critical value received:  K+ 2.7  Date of notification:  12-5  Time of notification:  08:45   Critical value read back:Yes.    Nurse who received alert: Deno Etienne  MD notified (1st page):  Memon   Time of first page:  [phone]  MD notified (2nd page):  Time of second page:  Responding MD:  Roderic Palau  Time MD responded:  [phone]

## 2015-03-06 NOTE — Progress Notes (Signed)
Dr. Arnoldo Morale stated patient's central line is ready to use.

## 2015-03-07 ENCOUNTER — Encounter (HOSPITAL_COMMUNITY): Payer: Self-pay | Admitting: General Surgery

## 2015-03-07 LAB — COMPREHENSIVE METABOLIC PANEL
ALT: 16 U/L (ref 14–54)
ANION GAP: 5 (ref 5–15)
AST: 22 U/L (ref 15–41)
Albumin: 2.1 g/dL — ABNORMAL LOW (ref 3.5–5.0)
Alkaline Phosphatase: 64 U/L (ref 38–126)
BUN: 14 mg/dL (ref 6–20)
CHLORIDE: 107 mmol/L (ref 101–111)
CO2: 23 mmol/L (ref 22–32)
Calcium: 7.6 mg/dL — ABNORMAL LOW (ref 8.9–10.3)
Creatinine, Ser: 0.79 mg/dL (ref 0.44–1.00)
GFR calc non Af Amer: >60 mL/min (ref >60)
Glucose, Bld: 122 mg/dL — ABNORMAL HIGH (ref 65–99)
Potassium: 4.3 mmol/L (ref 3.5–5.1)
SODIUM: 135 mmol/L (ref 135–145)
Total Bilirubin: 0.5 mg/dL (ref 0.3–1.2)
Total Protein: 4.9 g/dL — ABNORMAL LOW (ref 6.5–8.1)

## 2015-03-07 LAB — CBC
HEMATOCRIT: 26.5 % — AB (ref 36.0–46.0)
HEMOGLOBIN: 8.9 g/dL — AB (ref 12.0–15.0)
MCH: 26.9 pg (ref 26.0–34.0)
MCHC: 33.6 g/dL (ref 30.0–36.0)
MCV: 80.1 fL (ref 78.0–100.0)
Platelets: 167 10*3/uL (ref 150–400)
RBC: 3.31 MIL/uL — ABNORMAL LOW (ref 3.87–5.11)
RDW: 15 % (ref 11.5–15.5)
WBC: 5.7 10*3/uL (ref 4.0–10.5)

## 2015-03-07 LAB — MAGNESIUM: MAGNESIUM: 1.9 mg/dL (ref 1.7–2.4)

## 2015-03-07 LAB — PHOSPHORUS
Phosphorus: 1 mg/dL — CL (ref 2.5–4.6)
Phosphorus: 1.5 mg/dL — ABNORMAL LOW (ref 2.5–4.6)

## 2015-03-07 LAB — CULTURE, BLOOD (ROUTINE X 2)
CULTURE: NO GROWTH
Culture: NO GROWTH

## 2015-03-07 MED ORDER — SODIUM PHOSPHATE 3 MMOLE/ML IV SOLN
10.0000 mmol | Freq: Once | INTRAVENOUS | Status: DC
  Filled 2015-03-07: qty 3.33

## 2015-03-07 MED ORDER — LOSARTAN POTASSIUM 50 MG PO TABS
100.0000 mg | ORAL_TABLET | Freq: Every day | ORAL | Status: DC
  Administered 2015-03-07 – 2015-03-14 (×8): 100 mg via ORAL
  Filled 2015-03-07 (×7): qty 2

## 2015-03-07 MED ORDER — SODIUM PHOSPHATE 3 MMOLE/ML IV SOLN
10.0000 mmol | Freq: Once | INTRAVENOUS | Status: AC
  Administered 2015-03-07: 10 mmol via INTRAVENOUS
  Filled 2015-03-07: qty 3.33

## 2015-03-07 MED ORDER — ALPRAZOLAM 0.25 MG PO TABS
0.2500 mg | ORAL_TABLET | Freq: Three times a day (TID) | ORAL | Status: DC | PRN
  Administered 2015-03-07 – 2015-03-13 (×7): 0.25 mg via ORAL
  Filled 2015-03-07 (×8): qty 1

## 2015-03-07 MED ORDER — HEPARIN (PORCINE) IN NACL 100-0.45 UNIT/ML-% IJ SOLN
1300.0000 [IU]/h | INTRAMUSCULAR | Status: DC
  Administered 2015-03-07: 1000 [IU]/h via INTRAVENOUS
  Filled 2015-03-07: qty 250

## 2015-03-07 MED ORDER — POTASSIUM & SODIUM PHOSPHATES 280-160-250 MG PO PACK
1.0000 | PACK | Freq: Three times a day (TID) | ORAL | Status: DC
  Administered 2015-03-07 – 2015-03-14 (×29): 1 via ORAL
  Filled 2015-03-07 (×33): qty 1

## 2015-03-07 NOTE — Consult Note (Signed)
Real nurse in touch with her daughter Alcario Drought.  WOC will meet with daughters today at 4pm with patient to provide initial education for ostomy care.    [place], [place]

## 2015-03-07 NOTE — Evaluation (Signed)
Physical Therapy Evaluation Patient Details Name: [name] MRN: [id number] DOB: 1934-12-19 Today's Date: 03/07/2015   History of Present Illness  80 yo female h/o CAD, hiatal hernia, several months of epigastric /back pain being seen and work up by GI as outpatient comes in tonight with a different kind of abdominal pain new and severe since around 4p in the LLQ mostly of her abdomen with associated nausea no vomiting. No fevers. No chills. No diarrhea. The pain was sudden onset and has been persistent since around 4pm. Ct shows perforated bowel. Dr Arnoldo Morale called with general surgery by EDP. Pt has not had any vomiting since arrival, pain was better with dilaudid in the ED, but is now again a 9/10. Pt referred for admission for perforated bowel.  Pt underwent a partial colectomy with colostomy on 03-05-15.  She lives alone in an apartment and is normally independent with no assistive device.  Clinical Impression   Pt was seen for evaluation.  She was alert and oriented, very cooperative.  She was found to be mildly deconditioned from baseline and needed assist for transfers OOB.  She was able to stand at the EOB and step in place for about 20 seconds and then transferred to a chair.  Gait is limited by multiple ICU lines.  I am recommending SNF at d/c as pt will not be able to independently care for herself at d/c.  She is an excellent rehab pt and should progress rapidly to her baseline.  Pt is agreeable to this.    Follow Up Recommendations SNF    Equipment Recommendations  None recommended by PT    Recommendations for Other Services OT consult     Precautions / Restrictions Precautions Precautions: Fall Restrictions Weight Bearing Restrictions: No      Mobility  Bed Mobility Overal bed mobility: Needs Assistance Bed Mobility: Supine to Sit     Supine to sit: Min assist;HOB elevated        Transfers Overall transfer level: Needs assistance Equipment used: Rolling  walker (2 wheeled) Transfers: Sit to/from Stand Sit to Stand: Min guard         General transfer comment: mobility is limited by ICU lines but pt was able to stand to a walker and march in place for 20 seconds  Ambulation/Gait             General Gait Details: no gait...limited by ICU lines  Stairs            Wheelchair Mobility    Modified Rankin (Stroke Patients Only)       Balance Overall balance assessment: No apparent balance deficits (not formally assessed)                                           Pertinent Vitals/Pain Pain Assessment: No/denies pain    Home Living                   Additional Comments: has a new colostomy in right lower quadrant of abdomen with midline incision at inferior region of abdomen    Prior Function Level of Independence: Independent               Hand Dominance        Extremity/Trunk Assessment   Upper Extremity Assessment: Generalized weakness           Lower Extremity  Assessment: Generalized weakness      Cervical / Trunk Assessment: Normal  Communication   Communication: HOH  Cognition Arousal/Alertness: Awake/alert Behavior During Therapy: WFL for tasks assessed/performed Overall Cognitive Status: Within Functional Limits for tasks assessed                      General Comments      Exercises General Exercises - Lower Extremity Ankle Circles/Pumps: AROM;Both;10 reps;Supine Short Arc Quad: AROM;Both;10 reps;Supine Heel Slides: AAROM;Both;5 reps;Supine Hip ABduction/ADduction: AAROM;Both;5 reps;Supine      Assessment/Plan    PT Assessment Patient needs continued PT services  PT Diagnosis Difficulty walking;Generalized weakness   PT Problem List Decreased strength;Decreased activity tolerance;Decreased mobility;Decreased knowledge of use of DME;Decreased knowledge of precautions;Cardiopulmonary status limiting activity  PT Treatment Interventions Gait  training;Functional mobility training;Therapeutic exercise   PT Goals (Current goals can be found in the Care Plan section) Acute Rehab PT Goals Patient Stated Goal: none stated PT Goal Formulation: With patient Time For Goal Achievement: 03/21/15 Potential to Achieve Goals: Good    Frequency Min 3X/week   Barriers to discharge Decreased caregiver support lives alone    Co-evaluation               End of Session Equipment Utilized During Treatment: Gait belt;Oxygen Activity Tolerance: Patient tolerated treatment well;Patient limited by fatigue Patient left: in chair;with call bell/phone within reach Nurse Communication: Mobility status         Time: [id number] PT Time Calculation (min) (ACUTE ONLY): 38 min   Charges:   PT Evaluation $Initial PT Evaluation Tier I: 1 Procedure PT Treatments $Therapeutic Exercise: 8-22 mins   PT G CodesSable Feil  PT 03/07/2015, 12:28 PM [phone]

## 2015-03-07 NOTE — Consult Note (Signed)
WOC ostomy consult note Stoma type/location: LLQ, end colostomy Stomal assessment/size: 1 3/8" round, slight dip at distal edge of stoma. Stoma is pink, moist. Assessment through pouch. Patient had a bit of a rough night last evening and got quite combative, she seems clear this am and is able to converse with me about her family and living situation.  She lives along in a independent center apartment complex near her daughters.  I talked with Dr. Arnoldo Morale as well about her, she will most likely need short term rehab prior to returning home.  He daughters both work and will not be with her full time. I will discuss meeting with them this week to provide education and discuss the goals for the patient and the family.  Peristomal assessment: did not change pouch today Treatment options for stomal/peristomal skin: NA, may need additional of skin barrier ring due to the dip at the edge of the stoma.  Output: none Ostomy pouching: 2pc. In place from the OR.   Education provided: Basic review of ostomy creation with patient and discussion of basic care.  [name] was able to take a look at her abdomen and the pouch but it was apparent she appears a bit confused about the situation.  She wanted to wait until she was feeling a bit better to change the pouch and I agreed that based on her confusion over night that we would wait and try to meet with her and her daughters if possible. If she DC to rehab will need continued education for ostomy care in that setting as well.   Enrolled patient in [place] program: No, secure start DC card signed however.   [place] nurse will follow along with you for support with ostomy care [place], [place]

## 2015-03-07 NOTE — Progress Notes (Signed)
CRITICAL VALUE ALERT  Critical value received:  Phosphorus 1.0  Date of notification:  03/07/2015  Time of notification:  0617  Critical value read back:Yes.    Nurse who received alert:  Wonda Cheng, RN  MD notified (1st page):  Dr. Loralee Pacas  Time of first page:  0627  MD notified (2nd page):  Time of second page:  Responding MD:  Dr Loralee Pacas  Time MD responded:  0630  Orders received to replete phosphorus with Sodium Phos and repeat phosphorus level 1 hour after infusion is complete.

## 2015-03-07 NOTE — Progress Notes (Signed)
MEDICATION RELATED CONSULT NOTE - follow up  Pharmacy Consult for Phosphorus Replacement Indication: Hypophosphatemia  Allergies  Allergen Reactions  . Sulfamethoxazole Rash   Patient Measurements: Height: 5\' 3"  (160 cm) Weight: 162 lb 4.1 oz (73.6 kg) IBW/kg (Calculated) : 52.4  Vital Signs: Temp: 99.8 F (37.7 C) (12/06 0645) Temp Source: Core (Comment) (12/06 0400) BP: 158/58 mmHg (12/06 0645) Pulse Rate: 66 (12/06 0645) Intake/Output from previous day: 12/05 0701 - 12/06 0700 In: 2605.7 [I.V.:2505.7; IV Piggyback:100] Out: 1660 [Urine:1600; Drains:60] Intake/Output from this shift:    Labs:  Recent Labs  03/05/15 0223 03/06/15 0040 03/06/15 0801 03/07/15 0420  WBC 9.8 10.8*  --  5.7  HGB 10.7* 9.5*  --  8.9*  HCT 33.4* 28.6*  --  26.5*  PLT 154 150  --  167  CREATININE 1.30* 0.91 0.92 0.79  MG 2.1 2.2 2.2 1.9  PHOS 2.3* 1.5*  --  1.0*  ALBUMIN  --   --  2.3* 2.1*  PROT  --   --  5.3* 4.9*  AST  --   --  24 22  ALT  --   --  19 16  ALKPHOS  --   --  65 64  BILITOT  --   --  0.9 0.5   Estimated Creatinine Clearance: 55.7 mL/min (by C-G formula based on Cr of 0.79).  Microbiology: Recent Results (from the past 720 hour(s))  Culture, blood (routine x 2)     Status: None (Preliminary result)   Collection Time: 03/02/15 11:10 PM  Result Value Ref Range Status   Specimen Description BLOOD RIGHT HAND  Final   Special Requests BOTTLES DRAWN AEROBIC AND ANAEROBIC 4CC EACH  Final   Culture NO GROWTH 4 DAYS  Final   Report Status PENDING  Incomplete  Culture, blood (routine x 2)     Status: None (Preliminary result)   Collection Time: 03/02/15 11:23 PM  Result Value Ref Range Status   Specimen Description BLOOD LEFT FOREARM  Final   Special Requests   Final    BOTTLES DRAWN AEROBIC AND ANAEROBIC AEB 4CC ANA 2CC   Culture NO GROWTH 4 DAYS  Final   Report Status PENDING  Incomplete  Surgical pcr screen     Status: None   Collection Time: 03/03/15  2:45 AM   Result Value Ref Range Status   MRSA, PCR NEGATIVE NEGATIVE Final   Staphylococcus aureus NEGATIVE NEGATIVE Final    Comment:        The Xpert SA Assay (FDA approved for NASAL specimens in patients over 25 years of age), is one component of a comprehensive surveillance program.  Test performance has been validated by [facility] for patients greater than or equal to 101 year old. It is not intended to diagnose infection nor to guide or monitor treatment.   MRSA PCR Screening     Status: None   Collection Time: 03/04/15  1:43 PM  Result Value Ref Range Status   MRSA by PCR NEGATIVE NEGATIVE Final    Comment:        The GeneXpert MRSA Assay (FDA approved for NASAL specimens only), is one component of a comprehensive MRSA colonization surveillance program. It is not intended to diagnose MRSA infection nor to guide or monitor treatment for MRSA infections.    Medical History: Past Medical History  Diagnosis Date  . Depressive disorder, not elsewhere classified   . Anxiety state, unspecified   . Pure hypercholesterolemia   . Esophageal  reflux   . Infection of esophagostomy ([place])   . Congestive heart failure, unspecified     diastolic heart failure  . Pulmonary hypertension (HCC)     PA systolic pressure [id number] mmHg by echocardiogram, PA pressure 33/10 by cardiac catheterization PA saturation 62% thermodilution cardiac index 2.0 thick cardiac index 2.4  . Bradycardia     previously with coreg  . Tricuspid regurgitation     moderate tricuspid regurgitation by echocardiogram, prior use of anorexic agents  . Melanoma ([place]) 1995    removal on back   . Coronary artery disease     a. Moderate to severe coronary disease involving the left anterior descending artery and right coronary artery.  Sequential stenosis in the LAD is significant.  Right coronary artery is moderate to severely likely nonischemic. Questionable small LVOT obstruction.  No Brockenbrough maneuver was  performed.  Catheterization January 2013; b. 05/2014 MV no isch/infarct, EF 60%.  Marland Kitchen PONV (postoperative nausea and vomiting)   . Hypertension   . H/O hiatal hernia   . Gastritis   . [name]'s esophagus   . IBS (irritable bowel syndrome)   . Macular degeneration   . Dysrhythmia    Assessment: 80yo female.  K+ and Phos low.  Pt is received KCL IV runs on 12/5. SCr has improved since admission. Phosphorus lower today despite K-Phos 64mMols IV yesterday (charted as given) K+ is WNL today MD has ordered Na-Phos 42mMOls IV for this morning Estimated Creatinine Clearance: 55.7 mL/min (by C-G formula based on Cr of 0.79).  Goal of Therapy:  Replace K+ and Phos to WNL  Plan:  Add Neutra-Phos 1 packet PO ac/hs starting today at lunch F/U labs in am  Hart Robinsons A 03/07/2015,7:45 AM

## 2015-03-07 NOTE — Progress Notes (Signed)
2 Days Post-Op  Subjective: Patient alert. She still seems to be a little confused as to her recent surgical history. She denies any abdominal pain.  Objective: Vital signs in last 24 hours: Temp:  [99.7 F (37.6 C)-100.5 F (38.1 C)] 99.8 F (37.7 C) (12/06 0645) Pulse Rate:  [41-148] 66 (12/06 0645) Resp:  [18-33] 24 (12/06 0645) BP: (139-206)/(54-194) 158/58 mmHg (12/06 0645) SpO2:  [87 %-100 %] 96 % (12/06 0645) Weight:  [73.6 kg (162 lb 4.1 oz)] 73.6 kg (162 lb 4.1 oz) (12/06 0500) Last BM Date:  (colostomy)  Intake/Output from previous day: 12/05 0701 - 12/06 0700 In: 2605.7 [I.V.:2505.7; IV Piggyback:100] Out: 1660 [Urine:1600; Drains:60] Intake/Output this shift: Total I/O In: -  Out: 700 [Urine:700]  General appearance: alert, cooperative and appears stated age GI: Soft, incision healing well. Ostomy pink. No stool in ostomy yet.  Lab Results:   Recent Labs  03/06/15 0040 03/07/15 0420  WBC 10.8* 5.7  HGB 9.5* 8.9*  HCT 28.6* 26.5*  PLT 150 167   BMET  Recent Labs  03/06/15 0801 03/06/15 1625 03/07/15 0420  NA 140  --  135  K 2.7* 3.8 4.3  CL 111  --  107  CO2 21*  --  23  GLUCOSE 123*  --  122*  BUN 20  --  14  CREATININE 0.92  --  0.79  CALCIUM 7.3*  --  7.6*   PT/INR No results for input(s): LABPROT, INR in the last 72 hours.  Studies/Results: Dg Chest Port 1 View  03/05/2015  CLINICAL DATA:  Central line placement. EXAM: PORTABLE CHEST 1 VIEW COMPARISON:  03/04/2015 and 03/02/2015. FINDINGS: 1304 hours. New left subclavian central venous catheter projected laterally at the mid SVC level near the origin of the azygos vein. The heart size and mediastinal contours are stable with a known hiatal hernia. There is no pneumothorax. Medial right basilar airspace disease appears unchanged. The left lung is clear. IMPRESSION: Central venous catheter tip at the mid SVC level near the origin of the azygos vein. No pneumothorax. No significant change in  medial right basilar airspace disease. Electronically Signed   By: Richardean Sale M.D.   On: 03/05/2015 14:15    Anti-infectives: Anti-infectives    Start     Dose/Rate Route Frequency Ordered Stop   03/03/15 0900  piperacillin-tazobactam (ZOSYN) IVPB 3.375 g     3.375 g 12.5 mL/hr over 240 Minutes Intravenous Every 8 hours 03/03/15 0739     03/03/15 0100  piperacillin-tazobactam (ZOSYN) IVPB 3.375 g     3.375 g 100 mL/hr over 30 Minutes Intravenous  Once 03/03/15 0049 03/03/15 0121      Assessment/Plan: s/p Procedure(s): PARTIAL COLECTOMY WITH COLOSTOMY CREATION/HARTMANN PROCEDURE CENTRAL LINE INSERTION Impression: Stable on postoperative day 2, status post Hartman's procedure for perforated sigmoid colon. Hypophosphatemia still present. Awaiting return of bowel function. Appreciate ostomy consult. Will get [place] manager involved for possible short-term placement. Will get patient up to chair. Adjust IV fluids. May start anticoagulation due to history of atrial fibrillation.  LOS: 4 days    [name] A 03/07/2015

## 2015-03-07 NOTE — Progress Notes (Signed)
[facility] PROGRESS NOTE  [name] [id number] DOB: 01/13/1935 DOA: 03/02/2015 PCP: Terald Sleeper, PA-C Summary  35 yow presented with complaints of LLQ abdominal pain with associated nausea. CT scan reveal  perforated bowel. General surgery preformed partial colectomy 120/04 with no immediate complications. Her overall hospital course has been complicated by development of Afib. She has been started on rate control and since converted back to SR. Her CHADSVASc score is 4, so she will need to start on anticoagulation once cleared by surgery. Anticipate discharge in the next 2-3 days.   Assessment/Plan: 1. Perforated sigmoid colon, revealed on CT A/P 12/2. Dr. Arnoldo Morale following closely and performed partial colectomy 12/04. Marland Kitchen Continue abx, antiemetics, and pain management.  2. AKI superimposed on CKD stage 3, likely due to IV contrast. Resolved with IVF. Continue current treatments. 3. Non-anion gap metabolic acidosis, likely related to hyperchloremia. Improved with switching fluids to D5 1/2 NS.  4. Atrial fibrillation. Appears she has converted to SR. Continue Metoprolol.Marland Kitchen ECHO as below. Her chadsvasc score is 4.  Will start anticoagulation therapy when cleared by general surgery. 5. Hypokalemia, resolved.  6. Hypophosphatemia, replace.  7. GERD, continue PPI. 8. Essential HTN,  BP remains elevated. Will restart home dose of Losartan. Continue PRN Hydralazine.  9. CAD, stable. Continue ASA  10. HLD, continue statin.  11. Fever. Noted to run a mild fever overnight that resolved with Tylenol. She is currently on abc for #1.  Code Status: Full DVT prophylaxis: Lovenox Family Communication:   Discussed with patient who understands and has no concerns at this time.   Disposition Plan: Anticipate discharge once improved.    Consultants:  General surgery  Procedures:  ECHO  Study Conclusions  - Left ventricle: The cavity size was normal. Systolic function was normal. The  estimated ejection fraction was in the range of 55% to 60%. Wall motion was normal; there were no regional wall motion abnormalities. - Mitral valve: Mildly calcified annulus. There was mild regurgitation. - Left atrium: The atrium was moderately to severely dilated. - Pulmonary arteries: Systolic pressure was moderately increased. PA peak pressure: 59 mm Hg (S).  Antibiotics:  Zosyn 12/2>>  HPI/Subjective: Overnight nurse reports patient ran a fever, started hallucinating and became combative. Was able to calm patient and stop hallucinations with Ativan and control fever with Tylenol.  Stomach and breathing feels fine. Able to tolerate small amounts of liquid. Denies any confusion.   Objective: Filed Vitals:   03/07/15 0615 03/07/15 0630  BP: 155/81 152/62  Pulse: 67 66  Temp: 99.8 F (37.7 C) 99.8 F (37.7 C)  Resp: 24 22    Intake/Output Summary (Last 24 hours) at 03/07/15 0646 Last data filed at 03/07/15 0600  Gross per 24 hour  Intake 2605.67 ml  Output   1630 ml  Net 975.67 ml   Filed Weights   03/05/15 0500 03/06/15 0500 03/07/15 0500  Weight: 73.5 kg (162 lb 0.6 oz) 74.3 kg (163 lb 12.8 oz) 73.6 kg (162 lb 4.1 oz)    Exam:  General: NAD. Appears calm and looks comfortable lying in bed.  Cardiovascular: Irregular  Respiratory: clear bilaterally, No wheezing, rales or rhonchi Abdomen: soft, no distention, Colostomy noted. Musculoskeletal: No edema b/l Psychiatric: Oriented to self, place, and reason for being in the hospital.       Data Reviewed: Basic Metabolic Panel:  Recent Labs Lab 03/04/15 0633 03/05/15 0223 03/06/15 0040 03/06/15 0801 03/06/15 1625 03/07/15 0420  NA 140 141 139 140  --  135  K 4.6 3.7 2.9* 2.7* 3.8 4.3  CL 111 114* 112* 111  --  107  CO2 22 19* 22 21*  --  23  GLUCOSE 108* 121* 123* 123*  --  122*  BUN 37* 33* 20 20  --  14  CREATININE 2.09* 1.30* 0.91 0.92  --  0.79  CALCIUM 7.1* 7.0* 7.0* 7.3*  --  7.6*  MG  1.7 2.1 2.2 2.2  --  1.9  PHOS 4.7* 2.3* 1.5*  --   --  1.0*   Liver Function Tests:  Recent Labs Lab 03/02/15 2045 03/06/15 0801 03/07/15 0420  AST 21 24 22   ALT 14 19 16   ALKPHOS 59 65 64  BILITOT 0.9 0.9 0.5  PROT 7.1 5.3* 4.9*  ALBUMIN 4.5 2.3* 2.1*    Recent Labs Lab 03/02/15 2045  LIPASE 24   CBC:  Recent Labs Lab 03/02/15 2045  03/03/15 0626 03/04/15 0633 03/05/15 0223 03/06/15 0040 03/07/15 0420  WBC 13.2*  --  10.1 13.7* 9.8 10.8* 5.7  NEUTROABS 10.8*  --   --   --   --   --   --   HGB 12.8  < > 12.3 11.0* 10.7* 9.5* 8.9*  HCT 38.7  < > 37.4 35.9* 33.4* 28.6* 26.5*  MCV 82.2  --  83.3 86.5 83.5 80.8 80.1  PLT 189  --  172 182 154 150 167  < > = values in this interval not displayed. Cardiac Enzymes:  Recent Labs Lab 03/02/15 2045 03/04/15 1512 03/04/15 2128 03/05/15 0223  TROPONINI <0.03 0.03 0.05* 0.05*   BNP (last 3 results)  Recent Labs  05/08/14 0315  BNP 182.8*      Recent Results (from the past 240 hour(s))  Culture, blood (routine x 2)     Status: None (Preliminary result)   Collection Time: 03/02/15 11:10 PM  Result Value Ref Range Status   Specimen Description BLOOD RIGHT HAND  Final   Special Requests BOTTLES DRAWN AEROBIC AND ANAEROBIC 4CC EACH  Final   Culture NO GROWTH 4 DAYS  Final   Report Status PENDING  Incomplete  Culture, blood (routine x 2)     Status: None (Preliminary result)   Collection Time: 03/02/15 11:23 PM  Result Value Ref Range Status   Specimen Description BLOOD LEFT FOREARM  Final   Special Requests   Final    BOTTLES DRAWN AEROBIC AND ANAEROBIC AEB 4CC ANA 2CC   Culture NO GROWTH 4 DAYS  Final   Report Status PENDING  Incomplete  Surgical pcr screen     Status: None   Collection Time: 03/03/15  2:45 AM  Result Value Ref Range Status   MRSA, PCR NEGATIVE NEGATIVE Final   Staphylococcus aureus NEGATIVE NEGATIVE Final    Comment:        The Xpert SA Assay (FDA approved for NASAL specimens in  patients over 65 years of age), is one component of a comprehensive surveillance program.  Test performance has been validated by [facility] for patients greater than or equal to 42 year old. It is not intended to diagnose infection nor to guide or monitor treatment.   MRSA PCR Screening     Status: None   Collection Time: 03/04/15  1:43 PM  Result Value Ref Range Status   MRSA by PCR NEGATIVE NEGATIVE Final    Comment:        The GeneXpert MRSA Assay (FDA approved for NASAL specimens only), is one component  of a comprehensive MRSA colonization surveillance program. It is not intended to diagnose MRSA infection nor to guide or monitor treatment for MRSA infections.      Studies: Dg Chest Port 1 View  03/05/2015  CLINICAL DATA:  Central line placement. EXAM: PORTABLE CHEST 1 VIEW COMPARISON:  03/04/2015 and 03/02/2015. FINDINGS: 1304 hours. New left subclavian central venous catheter projected laterally at the mid SVC level near the origin of the azygos vein. The heart size and mediastinal contours are stable with a known hiatal hernia. There is no pneumothorax. Medial right basilar airspace disease appears unchanged. The left lung is clear. IMPRESSION: Central venous catheter tip at the mid SVC level near the origin of the azygos vein. No pneumothorax. No significant change in medial right basilar airspace disease. Electronically Signed   By: Richardean Sale M.D.   On: 03/05/2015 14:15    Scheduled Meds: . enoxaparin (LOVENOX) injection  40 mg Subcutaneous Q24H  . guaiFENesin  1,200 mg Oral BID  . metoprolol tartrate  25 mg Oral Q6H  . pantoprazole (PROTONIX) IV  40 mg Intravenous Q24H  . piperacillin-tazobactam (ZOSYN)  IV  3.375 g Intravenous Q8H  . sodium chloride  10-40 mL Intracatheter Q12H  . sodium chloride  10-40 mL Intracatheter Q12H  . sodium chloride  3 mL Intravenous Q12H  . sodium phosphate  Dextrose 5% IVPB  10 mmol Intravenous Once   Continuous Infusions: .  dextrose 5 % and 0.45 % NaCl with KCl 40 mEq/L 100 mL/hr at 03/07/15 0600  . diltiazem (CARDIZEM) infusion 5 mg/hr (03/07/15 0600)    Principal Problem:   Perforated sigmoid colon (HCC) Active Problems:   HYPERCHOLESTEROLEMIA   Anxiety state   DIASTOLIC DYSFUNCTION   GERD   HTN (hypertension)   Coronary artery disease   Abdominal pain   Lactic acidosis   Prolonged Q-T interval on ECG   CKD (chronic kidney disease) stage 3, GFR 30-59 ml/min   AKI (acute kidney injury) ([place])   Atrial flutter ([place])    Time spent: 20  minutes   Jehanzeb Memon. MD  [facility] Pager [phone]. If 7PM-7AM, please contact night-coverage at [email or web address], password [facility] 03/07/2015, 6:46 AM  LOS: 4 days     By signing my name below, I, Rennis Harding, attest that this documentation has been prepared under the direction and in the presence of Kathie Dike, MD. Electronically signed: Rennis Harding, Scribe. 03/07/2015 8:38am  I, Dr. Kathie Dike, personally performed the services described in this documentaiton. All medical record entries made by the scribe were at my direction and in my presence. I have reviewed the chart and agree that the record reflects my personal performance and is accurate and complete  Kathie Dike, MD, 03/07/2015 8:49 AM

## 2015-03-07 NOTE — Progress Notes (Signed)
PT'S THREE CHILDREN MET W/ WOUND/OSTOMY NURSE THIS AFTERNOON AND BEGAN CLOSTOMY CARE TEACHING,ALL OF THEM SEEMED VERY INTERESTED TO LEARN.

## 2015-03-07 NOTE — Care Management Note (Signed)
[place] Management Note  Patient Details  Name: [name] MRN: [id number] Date of Birth: [birth year]   Expected Discharge Date:                  Expected Discharge Plan:  Skilled Nursing Facility  In-House Referral:  Clinical Social Work  Discharge planning Services  CM Consult  Post Acute Care Choice:  NA Choice offered to:  NA  DME Arranged:    DME Agency:     HH Arranged:    [place] Agency:     Status of Service:  In process, will continue to follow  Medicare Important Message Given:  Yes Date Medicare IM Given:    Medicare IM give by:    Date Additional Medicare IM Given:    Additional Medicare Important Message give by:     If discussed at [place] of Stay Meetings, dates discussed:  03/07/2015  Additional Comments:  Sherald Barge, RN 03/07/2015, 3:31 PM

## 2015-03-07 NOTE — Progress Notes (Signed)
ANTICOAGULATION CONSULT NOTE - Initial Consult  Pharmacy Consult for Heparin  Indication: atrial fibrillation  Allergies  Allergen Reactions  . Sulfamethoxazole Rash    Patient Measurements: Height: 5\' 3"  (160 cm) Weight: 162 lb 4.1 oz (73.6 kg) IBW/kg (Calculated) : 52.4 HEPARIN DW (KG): 67.9   Vital Signs: Temp: 99.6 F (37.6 C) (12/06 1800) BP: 134/54 mmHg (12/06 1800) Pulse Rate: 60 (12/06 1800)  Labs:  Recent Labs  03/04/15 2128  03/05/15 0223 03/06/15 0040 03/06/15 0801 03/07/15 0420  HGB  --   < > 10.7* 9.5*  --  8.9*  HCT  --   --  33.4* 28.6*  --  26.5*  PLT  --   --  154 150  --  167  CREATININE  --   < > 1.30* 0.91 0.92 0.79  TROPONINI 0.05*  --  0.05*  --   --   --   < > = values in this interval not displayed.  Estimated Creatinine Clearance: 55.7 mL/min (by C-G formula based on Cr of 0.79).   Medical History: Past Medical History  Diagnosis Date  . Depressive disorder, not elsewhere classified   . Anxiety state, unspecified   . Pure hypercholesterolemia   . Esophageal reflux   . Infection of esophagostomy ([place])   . Congestive heart failure, unspecified     diastolic heart failure  . Pulmonary hypertension (HCC)     PA systolic pressure [id number] mmHg by echocardiogram, PA pressure 33/10 by cardiac catheterization PA saturation 62% thermodilution cardiac index 2.0 thick cardiac index 2.4  . Bradycardia     previously with coreg  . Tricuspid regurgitation     moderate tricuspid regurgitation by echocardiogram, prior use of anorexic agents  . Melanoma ([place]) 1995    removal on back   . Coronary artery disease     a. Moderate to severe coronary disease involving the left anterior descending artery and right coronary artery.  Sequential stenosis in the LAD is significant.  Right coronary artery is moderate to severely likely nonischemic. Questionable small LVOT obstruction.  No Brockenbrough maneuver was performed.  Catheterization January 2013; b.  05/2014 MV no isch/infarct, EF 60%.  Marland Kitchen PONV (postoperative nausea and vomiting)   . Hypertension   . H/O hiatal hernia   . Gastritis   . [name]'s esophagus   . IBS (irritable bowel syndrome)   . Macular degeneration   . Dysrhythmia    Medications:  Scheduled:  . enoxaparin (LOVENOX) injection  40 mg Subcutaneous Q24H  . guaiFENesin  1,200 mg Oral BID  . losartan  100 mg Oral Daily  . metoprolol tartrate  25 mg Oral Q6H  . pantoprazole (PROTONIX) IV  40 mg Intravenous Q24H  . piperacillin-tazobactam (ZOSYN)  IV  3.375 g Intravenous Q8H  . potassium & sodium phosphates  1 packet Oral TID WC & HS  . sodium chloride  10-40 mL Intracatheter Q12H  . sodium chloride  10-40 mL Intracatheter Q12H  . sodium chloride  3 mL Intravenous Q12H    Assessment: Okay for Protocol, s/p Colectomy on 12/04. New Afib with chadsvasc score is 4.  Received Lovenox this AM.  Goal of Therapy:  Heparin level 0.3-0.7 units/ml Monitor platelets by anticoagulation protocol: Yes   Plan:  No Bolus Start heparin infusion at 1000 units/hr Check anti-Xa level in 6-8 hours and daily while on heparin Continue to monitor H&H and platelets  Pricilla Larsson 03/07/2015,7:17 PM

## 2015-03-08 DIAGNOSIS — K631 Perforation of intestine (nontraumatic): Principal | ICD-10-CM

## 2015-03-08 DIAGNOSIS — N183 Chronic kidney disease, stage 3 (moderate): Secondary | ICD-10-CM

## 2015-03-08 DIAGNOSIS — I10 Essential (primary) hypertension: Secondary | ICD-10-CM

## 2015-03-08 DIAGNOSIS — E7800 Pure hypercholesterolemia, unspecified: Secondary | ICD-10-CM

## 2015-03-08 DIAGNOSIS — Z955 Presence of coronary angioplasty implant and graft: Secondary | ICD-10-CM

## 2015-03-08 DIAGNOSIS — Z5181 Encounter for therapeutic drug level monitoring: Secondary | ICD-10-CM

## 2015-03-08 DIAGNOSIS — I2510 Atherosclerotic heart disease of native coronary artery without angina pectoris: Secondary | ICD-10-CM

## 2015-03-08 DIAGNOSIS — Z7901 Long term (current) use of anticoagulants: Secondary | ICD-10-CM

## 2015-03-08 DIAGNOSIS — I480 Paroxysmal atrial fibrillation: Secondary | ICD-10-CM

## 2015-03-08 LAB — CBC
HCT: 27.2 % — ABNORMAL LOW (ref 36.0–46.0)
Hemoglobin: 9.1 g/dL — ABNORMAL LOW (ref 12.0–15.0)
MCH: 26.5 pg (ref 26.0–34.0)
MCHC: 33.5 g/dL (ref 30.0–36.0)
MCV: 79.1 fL (ref 78.0–100.0)
Platelets: 173 10*3/uL (ref 150–400)
RBC: 3.44 MIL/uL — ABNORMAL LOW (ref 3.87–5.11)
RDW: 15 % (ref 11.5–15.5)
WBC: 11.5 10*3/uL — ABNORMAL HIGH (ref 4.0–10.5)

## 2015-03-08 LAB — BASIC METABOLIC PANEL
ANION GAP: 5 (ref 5–15)
BUN: 11 mg/dL (ref 6–20)
CALCIUM: 7.7 mg/dL — AB (ref 8.9–10.3)
CO2: 23 mmol/L (ref 22–32)
CREATININE: 0.77 mg/dL (ref 0.44–1.00)
Chloride: 104 mmol/L (ref 101–111)
Glucose, Bld: 112 mg/dL — ABNORMAL HIGH (ref 65–99)
Potassium: 4.2 mmol/L (ref 3.5–5.1)
Sodium: 132 mmol/L — ABNORMAL LOW (ref 135–145)

## 2015-03-08 LAB — PHOSPHORUS: PHOSPHORUS: 1.7 mg/dL — AB (ref 2.5–4.6)

## 2015-03-08 LAB — MAGNESIUM: MAGNESIUM: 1.7 mg/dL (ref 1.7–2.4)

## 2015-03-08 LAB — HEPARIN LEVEL (UNFRACTIONATED): Heparin Unfractionated: 0.18 IU/mL — ABNORMAL LOW (ref 0.30–0.70)

## 2015-03-08 MED ORDER — APIXABAN 5 MG PO TABS
5.0000 mg | ORAL_TABLET | Freq: Two times a day (BID) | ORAL | Status: DC
  Administered 2015-03-08 (×2): 5 mg via ORAL
  Filled 2015-03-08 (×3): qty 1

## 2015-03-08 MED ORDER — METOPROLOL TARTRATE 25 MG PO TABS
25.0000 mg | ORAL_TABLET | Freq: Two times a day (BID) | ORAL | Status: DC
  Administered 2015-03-08 – 2015-03-14 (×12): 25 mg via ORAL
  Filled 2015-03-08 (×12): qty 1

## 2015-03-08 MED ORDER — PANTOPRAZOLE SODIUM 40 MG PO TBEC
40.0000 mg | DELAYED_RELEASE_TABLET | Freq: Every day | ORAL | Status: DC
  Administered 2015-03-08 – 2015-03-14 (×7): 40 mg via ORAL
  Filled 2015-03-08 (×7): qty 1

## 2015-03-08 MED ORDER — ENSURE ENLIVE PO LIQD
237.0000 mL | Freq: Two times a day (BID) | ORAL | Status: DC
  Administered 2015-03-08 – 2015-03-14 (×10): 237 mL via ORAL

## 2015-03-08 MED ORDER — SODIUM PHOSPHATE 3 MMOLE/ML IV SOLN
10.0000 mmol | Freq: Once | INTRAVENOUS | Status: DC
  Filled 2015-03-08: qty 3.33

## 2015-03-08 MED ORDER — SODIUM PHOSPHATE 3 MMOLE/ML IV SOLN
30.0000 mmol | Freq: Once | INTRAVENOUS | Status: DC
Start: 2015-03-08 — End: 2015-03-08
  Filled 2015-03-08: qty 10

## 2015-03-08 MED ORDER — MAGNESIUM SULFATE IN D5W 10-5 MG/ML-% IV SOLN
1.0000 g | Freq: Once | INTRAVENOUS | Status: AC
  Administered 2015-03-08: 1 g via INTRAVENOUS
  Filled 2015-03-08: qty 100

## 2015-03-08 MED ORDER — SODIUM PHOSPHATE 3 MMOLE/ML IV SOLN
20.0000 mmol | Freq: Once | INTRAVENOUS | Status: AC
  Administered 2015-03-08: 20 mmol via INTRAVENOUS
  Filled 2015-03-08: qty 6.67

## 2015-03-08 NOTE — Clinical Social Work Note (Signed)
Clinical Social Work Assessment  Patient Details  Name: [name] MRN: [id number] Date of Birth: 09-17-1934  Date of referral:  03/08/15               Reason for consult:  Facility Placement                Permission sought to share information with:    Permission granted to share information::     Name::        Agency::     Relationship::     Contact Information:     Housing/Transportation Living arrangements for the past 2 months:  Single Family Home Source of Information:  Patient Patient Interpreter Needed:  None Criminal Activity/Legal Involvement Pertinent to Current Situation/Hospitalization:  No - Comment as needed Significant Relationships:  Adult Children, Other Family Members Lives with:  Self Do you feel safe going back to the place where you live?  Yes Need for family participation in patient care:  Yes (Comment)  Care giving concerns: None identified.    Social Worker assessment / plan:  CSW met with patient who indicated that she lives alone and ambulates unassisted at baseline. Patient identified that she also completes her ADLs unassisted.  She stated that she has a very supportive family. CSW discussed with patient her PT evaluation recommendation.  Patient was agreeable to go to SNF. She identified that she lives in the [place] area and would be interested in [facility], [facility] or [facility].  She stated that these facilities were closest to her home.    Employment status:  Retired Nurse, adult PT Recommendations:  [place] / Referral to community resources:  [place]  Patient/Family's Response to care:  Patient is agreeable to go to SNF.  Patient/Family's Understanding of and Emotional Response to Diagnosis, Current Treatment, and Prognosis:  Patient has understanding of her diagnosis, treatment and prognosis.    Emotional Assessment Appearance:  Appears  stated age Attitude/Demeanor/Rapport:   (Cooperative) Affect (typically observed):  Calm Orientation:  Oriented to Self, Oriented to Place, Oriented to  Time, Oriented to Situation Alcohol / Substance use:  Not Applicable Psych involvement (Current and /or in the community):  No (Comment)  Discharge Needs  Concerns to be addressed:  Discharge Planning Concerns Readmission within the last 30 days:  No Current discharge risk:  None Barriers to Discharge:  No Barriers Identified   Ihor Gully, LCSW 03/08/2015, 11:01 AM [phone]

## 2015-03-08 NOTE — Progress Notes (Signed)
Initial Nutrition Assessment  DOCUMENTATION CODES:  Not applicable  INTERVENTION:  Ensure Enlive po BID, each supplement provides 350 kcal and 20 grams of protein  NUTRITION DIAGNOSIS:  Increased nutrient needs related to new surgical wound healing as evidenced by estimated nutritional requirements for the condition  GOAL:  Patient will meet greater than or equal to 90% of their needs  MONITOR:  PO intake, Supplement acceptance, Diet advancement, Skin   ASSESSMENT:  80 y/o female PMHx significant for depression, anxiety, CAD, HTN, CHF, nissen fundoplication, IBS, Gastritis, hiatal hernia who presents with new onset abdominal pain. Ct shows perforated colon.   Interval Hx: 12/2-12/7: Transferred to ICU 12/3 for A fib. 12/4: Partial colectomy with colostomy (Hartman's procedure), Central line placement  Following up with pt in light of new colostomy.  She has been able to tolerate ~25% of her CL diet. She was agreeable to Ensure, just not at this time as she is very tired. Briefly discussed diet for colostomy which is very similar to a normal diet. Reccommended GI soft and explained what it was, but she essentially follows that at her baseline anyway. No nutrition related questions at this time.   Diet Order:  Diet full liquid Room service appropriate?: Yes; Fluid consistency:: Thin  Skin:  New surgical incision to abdomen w/ colostomy, MSAD to sacrum/perineum.   Last BM:  Has had only slight output from new colostomy  Height:  Ht Readings from Last 1 Encounters:  03/07/15 5\' 3"  (1.6 m)   Weight:  Wt Readings from Last 1 Encounters:  03/08/15 163 lb 2.3 oz (74 kg)   Wt Readings from Last 10 Encounters:  03/08/15 163 lb 2.3 oz (74 kg)  02/14/15 147 lb 12.8 oz (67.042 kg)  02/10/15 148 lb (67.132 kg)  01/09/15 147 lb (66.679 kg)  09/26/14 168 lb (76.204 kg)  09/21/14 168 lb 12.8 oz (76.567 kg)  07/27/14 171 lb (77.565 kg)  06/09/14 167 lb (75.751 kg)  05/11/14 164 lb  (74.39 kg)  05/07/14 166 lb (75.297 kg)   Admit weight: 148 lbs. (67.27 kg)  Ideal Body Weight:  52.27 kg  BMI:  Body mass index using admit weight  is 27.1 kg/(m^2).  Estimated Nutritional Needs:  Kcal:  1600-1850 kcals (24-27 kcal/kg) Protein:  74-87 g pro (1.1-1.3 g/kg bw) Fluid:  1.7-1.9 liters  EDUCATION NEEDS:  No education needs identified at this time  Burtis Junes RD, LDN Nutrition Pager: [phone] 03/08/2015 1:31 PM

## 2015-03-08 NOTE — Progress Notes (Signed)
ANTICOAGULATION CONSULT NOTE - follow up  Pharmacy Consult for Heparin  Indication: atrial fibrillation  Allergies  Allergen Reactions  . Sulfamethoxazole Rash    Patient Measurements: Height: 5\' 3"  (160 cm) Weight: 163 lb 2.3 oz (74 kg) IBW/kg (Calculated) : 52.4 HEPARIN DW (KG): 67.9   Vital Signs: Temp: 99.4 F (37.4 C) (12/07 0730) Temp Source: Core (Comment) (12/07 0400) BP: 159/62 mmHg (12/07 0730) Pulse Rate: 69 (12/07 0730)  Labs:  Recent Labs  03/06/15 0040 03/06/15 0801 03/07/15 0420 03/08/15 0333  HGB 9.5*  --  8.9* 9.1*  HCT 28.6*  --  26.5* 27.2*  PLT 150  --  167 173  HEPARINUNFRC  --   --   --  0.18*  CREATININE 0.91 0.92 0.79 0.77    Estimated Creatinine Clearance: 55.8 mL/min (by C-G formula based on Cr of 0.77).   Medical History: Past Medical History  Diagnosis Date  . Depressive disorder, not elsewhere classified   . Anxiety state, unspecified   . Pure hypercholesterolemia   . Esophageal reflux   . Infection of esophagostomy ([place])   . Congestive heart failure, unspecified     diastolic heart failure  . Pulmonary hypertension (HCC)     PA systolic pressure [id number] mmHg by echocardiogram, PA pressure 33/10 by cardiac catheterization PA saturation 62% thermodilution cardiac index 2.0 thick cardiac index 2.4  . Bradycardia     previously with coreg  . Tricuspid regurgitation     moderate tricuspid regurgitation by echocardiogram, prior use of anorexic agents  . Melanoma ([place]) 1995    removal on back   . Coronary artery disease     a. Moderate to severe coronary disease involving the left anterior descending artery and right coronary artery.  Sequential stenosis in the LAD is significant.  Right coronary artery is moderate to severely likely nonischemic. Questionable small LVOT obstruction.  No Brockenbrough maneuver was performed.  Catheterization January 2013; b. 05/2014 MV no isch/infarct, EF 60%.  Marland Kitchen PONV (postoperative nausea and vomiting)    . Hypertension   . H/O hiatal hernia   . Gastritis   . [name]'s esophagus   . IBS (irritable bowel syndrome)   . Macular degeneration   . Dysrhythmia    Medications:  Scheduled:  . guaiFENesin  1,200 mg Oral BID  . losartan  100 mg Oral Daily  . metoprolol tartrate  25 mg Oral Q6H  . pantoprazole (PROTONIX) IV  40 mg Intravenous Q24H  . piperacillin-tazobactam (ZOSYN)  IV  3.375 g Intravenous Q8H  . potassium & sodium phosphates  1 packet Oral TID WC & HS  . sodium chloride  10-40 mL Intracatheter Q12H  . sodium chloride  10-40 mL Intracatheter Q12H  . sodium chloride  3 mL Intravenous Q12H  . sodium phosphate  Dextrose 5% IVPB  10 mmol Intravenous Once    Assessment: Okay for Protocol, s/p Colectomy on 12/04. New Afib with chadsvasc score is 4.  HL subtherapeutic at 0.18, increase rate.  Goal of Therapy:  Heparin level 0.3-0.7 units/ml Monitor platelets by anticoagulation protocol: Yes   Plan:  No Bolus Increase heparin to 1300 units/hr Check anti-Xa level in 8 hours and daily while on heparin Continue to monitor H&H and platelets  Isac Sarna, BS Vena Austria, BCPS Clinical Pharmacist Pager [phone] 03/08/2015,7:56 AM

## 2015-03-08 NOTE — Consult Note (Signed)
Reason for Consult:? Need for anticoagulation for atrial fib. Referring Physician: [facility] Cardiologist: Dr. Rodena Medin [name] is an 80 y.o. female.  HPI: This is a 80 yr old female patient of Dr. Bronson Ing who was admitted with a  perforated sigmoid colon and underwent partial colectomy and colostomy 12/4. She had post op atrial fib converted to NSR with metoprolol. CHADSVASc score=6. We are asked to see if she needs anticoagulation. She has had a prior history of PAF and bradycardia. Dr. Court Joy note from 12/2014 states she'll need anticoagulation if she has recurrence. 2Decho 03/05/15 normal LV function EF 55-60% mod-severe dilated LA,PA peak pressure 27m Hg(saw pulmonologist who ruled out pulmonary HTN)  She also has a hx of CAD S/P DES prox LAD 2013, also had residual disease in RCA, normal LV function. Last stress myoview 05/2014 no ischemia/infarct EF 60%. She has chronic diastolic CHF, HTN, PAF, symptomatic bradycardia, HLD.  Patient currently sitting up in chair without cardiac complaints. Denies chest pain, palpitations, dyspnea, dyspnea on exertion, dizziness or presyncope. Nausea and trouble eating.    Past Medical History  Diagnosis Date  . Depressive disorder, not elsewhere classified   . Anxiety state, unspecified   . Pure hypercholesterolemia   . Esophageal reflux   . [place] of esophagostomy ([place]   . Congestive heart failure, unspecified     diastolic heart failure  . Pulmonary hypertension (HCC)     PA systolic pressure 572-53mmHg by echocardiogram, PA pressure 33/10 by cardiac catheterization PA saturation 62% thermodilution cardiac index 2.0 thick cardiac index 2.4  . Bradycardia     previously with coreg  . Tricuspid regurgitation     moderate tricuspid regurgitation by echocardiogram, prior use of anorexic agents  . [place]    removal on back   . Coronary artery disease     a. Moderate to severe coronary disease involving the left anterior  descending artery and right coronary artery.  Sequential stenosis in the LAD is significant.  Right coronary artery is moderate to severely likely nonischemic. Questionable small LVOT obstruction.  No Brockenbrough maneuver was performed.  Catheterization January 2013; b. 05/2014 MV no isch/infarct, EF 60%.  .Marland KitchenPONV (postoperative nausea and vomiting)   . Hypertension   . H/O hiatal hernia   . Gastritis   . [name]'s esophagus   . IBS (irritable bowel syndrome)   . Macular degeneration   . Dysrhythmia     Past Surgical History  Procedure Laterality Date  . Nissen fundoplication  16644 . Rotator cuff repair  ~ 2001    right  . Coronary angioplasty with stent placement  05/08/11    "1"  . Tonsillectomy  ~ 1944  . Shoulder arthroscopy  ~ 2004; 2005    left; "joint's wore out"  . Cataract extraction w/ intraocular lens  implant, bilateral  ~ 2002  . Tubal ligation  1972  . Percutaneous coronary stent intervention (pci-s) N/A 05/08/2011    Procedure: PERCUTANEOUS CORONARY STENT INTERVENTION (PCI-S);  Surgeon: MWellington Hampshire MD;  [facility];  Service: Cardiovascular;  Laterality: N/A;  . Esophageal manometry N/A 06/06/2014    Procedure: ESOPHAGEAL MANOMETRY (EM);  Surgeon: JJerene Bears MD;  Location: [facility];  Service: Gastroenterology;  Laterality: N/A;  . Laparoscopic nissen fundoplication N/A 60/34/7425   Procedure: LAPAROSCOPIC  LYSIS OF ADHESIONS, SEGMENTAL GASTRECTOMY, PARTIAL REDUCTION OF HERNIA;  Surgeon: TJackolyn Confer MD;  Location: [facility]S;  Service: General;  Laterality: N/A;  .  Hernia repair    . Colectomy with colostomy creation/hartmann procedure N/A 03/05/2015    Procedure: PARTIAL COLECTOMY WITH COLOSTOMY CREATION/HARTMANN PROCEDURE;  Surgeon: Aviva Signs, MD;  Location: [facility];  Service: General;  Laterality: N/A;  . Cardiac catheterization Left 03/05/2015    Procedure: CENTRAL LINE INSERTION;  Surgeon: Aviva Signs, MD;  Location: [facility] ORS;  Service: General;   Laterality: Left;    Family History  Problem Relation Age of Onset  . Heart attack Father   . Hypertension Father   . Diabetes Mother   . Hypertension Mother   . Colon cancer Neg Hx   . Esophageal cancer Neg Hx   . Rectal cancer Neg Hx   . Stomach cancer Neg Hx   . Skin cancer Brother     melanoma    Social History:  reports that she quit smoking about 26 years ago. Her smoking use included Cigarettes. She started smoking about 56 years ago. She has a 20 pack-year smoking history. She has never used smokeless tobacco. She reports that she does not drink alcohol or use illicit drugs.  Allergies:  Allergies  Allergen Reactions  . Sulfamethoxazole Rash    Medications:  Scheduled Meds: . guaiFENesin  1,200 mg Oral BID  . losartan  100 mg Oral Daily  . metoprolol tartrate  25 mg Oral Q6H  . pantoprazole (PROTONIX) IV  40 mg Intravenous Q24H  . piperacillin-tazobactam (ZOSYN)  IV  3.375 g Intravenous Q8H  . potassium & sodium phosphates  1 packet Oral TID WC & HS  . sodium chloride  10-40 mL Intracatheter Q12H  . sodium chloride  10-40 mL Intracatheter Q12H  . sodium chloride  3 mL Intravenous Q12H  . sodium phosphate  Dextrose 5% IVPB  20 mmol Intravenous Once   Continuous Infusions: . diltiazem (CARDIZEM) infusion Stopped (03/08/15 0700)  . heparin 1,300 Units/hr (03/08/15 0807)   PRN Meds:.acetaminophen, ALPRAZolam, haloperidol lactate, hydrALAZINE, HYDROmorphone (DILAUDID) injection, LORazepam, magnesium hydroxide, ondansetron **OR** ondansetron (ZOFRAN) IV, simethicone, sodium chloride, sodium chloride   Results for orders placed or performed during the hospital encounter of 03/02/15 (from the past 48 hour(s))  Potassium     Status: None   Collection Time: 03/06/15  4:25 PM  Result Value Ref Range   Potassium 3.8 3.5 - 5.1 mmol/L    Comment: DELTA CHECK NOTED  Comprehensive metabolic panel     Status: Abnormal   Collection Time: 03/07/15  4:20 AM  Result Value Ref  Range   Sodium 135 135 - 145 mmol/L   Potassium 4.3 3.5 - 5.1 mmol/L   Chloride 107 101 - 111 mmol/L   CO2 23 22 - 32 mmol/L   Glucose, Bld 122 (H) 65 - 99 mg/dL   BUN 14 6 - 20 mg/dL   Creatinine, Ser 0.79 0.44 - 1.00 mg/dL   Calcium 7.6 (L) 8.9 - 10.3 mg/dL   Total Protein 4.9 (L) 6.5 - 8.1 g/dL   Albumin 2.1 (L) 3.5 - 5.0 g/dL   AST 22 15 - 41 U/L   ALT 16 14 - 54 U/L   Alkaline Phosphatase 64 38 - 126 U/L   Total Bilirubin 0.5 0.3 - 1.2 mg/dL   GFR calc non Af Amer >60 >60 mL/min   GFR calc Af Amer >60 >60 mL/min    Comment: (NOTE) The eGFR has been calculated using the CKD EPI equation. This calculation has not been validated in all clinical situations. eGFR's persistently <60 mL/min signify possible Chronic Kidney  Disease.    Anion gap 5 5 - 15  Phosphorus     Status: Abnormal   Collection Time: 03/07/15  4:20 AM  Result Value Ref Range   Phosphorus 1.0 (LL) 2.5 - 4.6 mg/dL    Comment: CRITICAL RESULT CALLED TO, READ BACK BY AND VERIFIED WITH: MCDANIEL,M AT 6:15AM ON 03/07/15 BY FESTERMAN,C   Magnesium     Status: None   Collection Time: 03/07/15  4:20 AM  Result Value Ref Range   Magnesium 1.9 1.7 - 2.4 mg/dL  CBC     Status: Abnormal   Collection Time: 03/07/15  4:20 AM  Result Value Ref Range   WBC 5.7 4.0 - 10.5 K/uL   RBC 3.31 (L) 3.87 - 5.11 MIL/uL   Hemoglobin 8.9 (L) 12.0 - 15.0 g/dL   HCT 26.5 (L) 36.0 - 46.0 %   MCV 80.1 78.0 - 100.0 fL   MCH 26.9 26.0 - 34.0 pg   MCHC 33.6 30.0 - 36.0 g/dL   RDW 15.0 11.5 - 15.5 %   Platelets 167 150 - 400 K/uL  Phosphorus     Status: Abnormal   Collection Time: 03/07/15  4:32 PM  Result Value Ref Range   Phosphorus 1.5 (L) 2.5 - 4.6 mg/dL  Basic metabolic panel     Status: Abnormal   Collection Time: 03/08/15  3:33 AM  Result Value Ref Range   Sodium 132 (L) 135 - 145 mmol/L   Potassium 4.2 3.5 - 5.1 mmol/L   Chloride 104 101 - 111 mmol/L   CO2 23 22 - 32 mmol/L   Glucose, Bld 112 (H) 65 - 99 mg/dL   BUN 11  6 - 20 mg/dL   Creatinine, Ser 0.77 0.44 - 1.00 mg/dL   Calcium 7.7 (L) 8.9 - 10.3 mg/dL   GFR calc non Af Amer >60 >60 mL/min   GFR calc Af Amer >60 >60 mL/min    Comment: (NOTE) The eGFR has been calculated using the CKD EPI equation. This calculation has not been validated in all clinical situations. eGFR's persistently <60 mL/min signify possible Chronic Kidney Disease.    Anion gap 5 5 - 15  Heparin level (unfractionated)     Status: Abnormal   Collection Time: 03/08/15  3:33 AM  Result Value Ref Range   Heparin Unfractionated 0.18 (L) 0.30 - 0.70 IU/mL    Comment:        IF HEPARIN RESULTS ARE BELOW EXPECTED VALUES, AND PATIENT DOSAGE HAS BEEN CONFIRMED, SUGGEST FOLLOW UP TESTING OF ANTITHROMBIN III LEVELS.   Magnesium     Status: None   Collection Time: 03/08/15  3:33 AM  Result Value Ref Range   Magnesium 1.7 1.7 - 2.4 mg/dL  Phosphorus     Status: Abnormal   Collection Time: 03/08/15  3:33 AM  Result Value Ref Range   Phosphorus 1.7 (L) 2.5 - 4.6 mg/dL  CBC     Status: Abnormal   Collection Time: 03/08/15  3:33 AM  Result Value Ref Range   WBC 11.5 (H) 4.0 - 10.5 K/uL   RBC 3.44 (L) 3.87 - 5.11 MIL/uL   Hemoglobin 9.1 (L) 12.0 - 15.0 g/dL   HCT 27.2 (L) 36.0 - 46.0 %   MCV 79.1 78.0 - 100.0 fL   MCH 26.5 26.0 - 34.0 pg   MCHC 33.5 30.0 - 36.0 g/dL   RDW 15.0 11.5 - 15.5 %   Platelets 173 150 - 400 K/uL    No results found.  ROS  See HPI Eyes: Negative Ears:hard of hearing Cardiovascular: Negative for chest pain, palpitations, dyspnea, dyspnea on exertion, near-syncope, orthopnea, paroxysmal nocturnal dyspnea and syncope,edema, claudication, cyanosis,.  Respiratory:   Negative for cough, hemoptysis, shortness of breath, sleep disturbances due to breathing, sputum production and wheezing.   Endocrine: Negative for cold intolerance and heat intolerance.  Hematologic/Lymphatic: Negative for adenopathy and bleeding problem. Does not bruise/bleed easily.   Musculoskeletal: Negative.   Gastrointestinal: positive  for nausea,colostomy, .   Genitourinary: Negative for bladder incontinence, dysuria, flank pain, frequency, hematuria, hesitancy, nocturia and urgency.  Neurological: Negative.  Allergic/Immunologic: Negative for environmental allergies.  Blood pressure 177/55, pulse 67, temperature 99.8 F (37.7 C), temperature source Core (Comment), resp. rate 20, height 5' 3" (1.6 m), weight 163 lb 2.3 oz (74 kg), SpO2 96 %. Physical Exam PHYSICAL EXAM: This, elderly, in no acute distress. Neck: No JVD, HJR, Bruit, or thyroid enlargement Lungs: Decreased Breath sounds,No tachypnea, clear without wheezing, rales, or rhonchi Cardiovascular: RRR, PMI not displaced, 1/6 systolic, gallops, bruit, thrill, or heave. Abdomen: BS normal. Soft without organomegaly, masses, lesions or tenderness. Extremities: without cyanosis, clubbing or edema. Good distal pulses bilateral SKin: Warm, no lesions or rashes  Musculoskeletal: No deformities Neuro: no focal signs  05/08/11 - PTCA and stenting of the proximal left anterior descending artery with a drug-eluting stent PCI Data:  Vessel - left anterior descending artery/Segment - proximal (12)   Percent Stenosis (pre) 80%   TIMI-flow 3   Stent 2.75 x 14 mm resolute integrity stent   Percent Stenosis (post) 0%   TIMI-flow (post) 3   FFR post-PCI: The resting gradient ratio was 0.9. FFR was 0.83. The ostial lesion was thus not treated with PCI.   Final Conclusions:  1. Successful angioplasty and 2.75 x 14 mm resolute integrity drug-eluting stent placement to the 80% lesion in proximal left anterior descending artery.  2. The ostial lesion in the LAD was estimated to be 60% with an FFR ratio of 0.83. Thus, medical therapy is recommended.  Recommendations:  I recommend dual antiplatelet therapy for at least one year. Continue aggressive treatment of risk factors. Consider cardiac rehabilitation.  2Decho  03/05/15  IMPRESSION: 1. No reversible ischemia or infarction.   2. Normal left ventricular wall motion.   3. Left ventricular ejection fraction 60%   4. Low-risk stress test findings*.   *2012 Appropriate Use Criteria for Coronary Revascularization Focused Update: J Am Coll Cardiol. 1448;18(5):631-497. http://content.airportbarriers.com.aspx?articleid=1201161     Electronically Signed   By: Julian Hy M.D.   On: 05/08/2014 14:54   Study Conclusions  - Left ventricle: The cavity size was normal. Systolic function was   normal. The estimated ejection fraction was in the range of 55%   to 60%. Wall motion was normal; there were no regional wall   motion abnormalities. - Mitral valve: Mildly calcified annulus. There was mild   regurgitation. - Left atrium: The atrium was moderately to severely dilated. - Pulmonary arteries: Systolic pressure was moderately increased.   PA peak pressure: 59 mm Hg (S).   Assessment/Plan: S/P partial colectomy and colostomy for perforated sigmoid colon 03/05/15  PAF with RVR converted to NSR. Has prior history of PAF and CHADSVASC=6 (female,age,CHF,HTN,CAD). Recommend NOAC- Dr. Arnoldo Morale says ok to start. On Metoprolol 25 mg q 6 hrs and rate controlled. Has prior history of bradycardia so will watch.  CAD S/P DES LAD 2013, residual RCA disease- nuclear stress without ischemia EF 60% 05/2014  History of Diastolic  CHF compensated.  HTN  Hyperlipidemia  [name] 03/08/2015, 10:18 AM   The patient was seen and examined, and I agree with the assessment and plan as documented above, with modifications as noted below. Pt well known to me from clinic with aforementioned history and presentation. Had post-operative atrial fibrillation which has since converted to sinus rhythm. Pt currently denies chest pain, leg swelling, and shortness of breath. Surgery has said patient can be anticoagulated. Will stop heparin and start apixaban 5 mg  bid this evening. Will switch metoprolol to 25 mg bid and monitor for bradycardia.   Kate Sable, MD, [facility]  03/08/2015 11:40 AM

## 2015-03-08 NOTE — [place] FL2 (Deleted)
[place] MEDICAID FL2 LEVEL OF CARE SCREENING TOOL     IDENTIFICATION  Patient Name: [name] Birthdate: Oct 27, 1934 Sex: female Admission Date (Current Location): 03/02/2015  [place] and [place] Number: [place] [facility]  [id number] [place] and Address:  [facility] [address], [place]      Provider Number: [id number]  Attending Physician Name and Address:  Albertine Patricia, MD  Relative Name and Phone Number:       Current Level of Care: Hospital Recommended Level of Care: [place] Prior Approval Number:    Date Approved/Denied:   PASRR Number:    Discharge Plan: SNF    Current Diagnoses: Patient Active Problem List   Diagnosis Date Noted  . Atrial flutter ([place]) 03/05/2015  . AKI (acute kidney injury) ([place]) 03/04/2015  . Perforated sigmoid colon ([place]) 03/03/2015  . Abdominal pain 03/03/2015  . Perforated bowel ([place]) 03/03/2015  . Lactic acidosis 03/03/2015  . Prolonged Q-T interval on ECG 03/03/2015  . CKD (chronic kidney disease) stage 3, GFR 30-59 ml/min 03/03/2015  . Nausea with vomiting 02/14/2015  . Right-sided low back pain with right-sided sciatica 02/14/2015  . Hiatal hernia 09/26/2014  . Shortness of breath 06/09/2014  . Midsternal chest pain 05/08/2014  . Chest pain 10/23/2012  . Acute kidney injury ([place]) 10/23/2012  . Atrial fibrillation with RVR ([place]) 09/06/2012  . Cellulitis of extremity 09/06/2012  . Acute renal failure ([place]) 09/06/2012  . Hypokalemia 09/06/2012  . Hyponatremia 09/06/2012  . Thrombocytopenia, unspecified ([place]) 09/06/2012  . Sepsis ([place]) 09/06/2012  . Edema 11/28/2011  . History of percutaneous coronary intervention   . Coronary artery disease   . Tricuspid regurgitation   . Symptomatic bradycardia   . DOE (dyspnea on exertion) 02/05/2011  . Palpitations 02/05/2011  . HTN (hypertension) 02/05/2011  . Sinus bradycardia 02/05/2011  . HYPERCHOLESTEROLEMIA 07/16/2007  . Anxiety  state 07/16/2007  . DEPRESSION, CHRONIC 07/16/2007  . DIASTOLIC DYSFUNCTION [id number]  . GERD 07/16/2007  . BARRETTS ESOPHAGUS 07/16/2007  . CONSTIPATION 07/16/2007    Orientation ACTIVITIES/SOCIAL BLADDER RESPIRATION    Self, Time, Situation, Place  Family supportive Continent Normal  BEHAVIORAL SYMPTOMS/MOOD NEUROLOGICAL BOWEL NUTRITION STATUS      Continent    PHYSICIAN VISITS COMMUNICATION OF NEEDS Height & Weight Skin    Verbally 5\' 3"  (160 cm) 163 lbs. Surgical wounds          AMBULATORY STATUS RESPIRATION    Supervision limited Normal      Personal Care Assistance Level of Assistance  Bathing, Dressing Bathing Assistance: Limited assistance   Dressing Assistance: Limited assistance      Functional Limitations Info                SPECIAL CARE FACTORS FREQUENCY                      Additional Factors Info  Allergies, Psychotropic   Allergies Info: Suflamethoxazole Psychotropic Info: Haldol, Xanax, Ativan         Current Medications (03/08/2015):  This is the current hospital active medication list Current Facility-Administered Medications  Medication Dose Route Frequency Provider Last Rate Last Dose  . acetaminophen (TYLENOL) tablet 650 mg  650 mg Oral Q6H PRN Kathie Dike, MD   650 mg at 03/06/15 1643  . ALPRAZolam Duanne Moron) tablet 0.25 mg  0.25 mg Oral TID PRN Aviva Signs, MD   0.25 mg at 03/08/15 1047  . diltiazem (CARDIZEM) 100 mg in dextrose 5 %  100 mL (1 mg/mL) infusion  5-15 mg/hr Intravenous Continuous Kathie Dike, MD   Stopped at 03/08/15 0700  . guaiFENesin (MUCINEX) 12 hr tablet 1,200 mg  1,200 mg Oral BID Kathie Dike, MD   1,200 mg at 03/08/15 1048  . haloperidol lactate (HALDOL) injection 5 mg  5 mg Intravenous Q6H PRN Kathie Dike, MD      . heparin ADULT infusion 100 units/mL (25000 units/250 mL)  1,300 Units/hr Intravenous Continuous Albertine Patricia, MD 13 mL/hr at 03/08/15 1100 1,300 Units/hr at 03/08/15 1100  .  hydrALAZINE (APRESOLINE) injection 10 mg  10 mg Intravenous Q6H PRN Kathie Dike, MD   10 mg at 03/07/15 1549  . HYDROmorphone (DILAUDID) injection 1 mg  1 mg Intravenous Q2H PRN Aviva Signs, MD   1 mg at 03/08/15 0727  . LORazepam (ATIVAN) injection 1 mg  1 mg Intravenous Q4H PRN Aviva Signs, MD   1 mg at 03/06/15 1524  . losartan (COZAAR) tablet 100 mg  100 mg Oral Daily Kathie Dike, MD   100 mg at 03/08/15 1048  . magnesium hydroxide (MILK OF MAGNESIA) suspension 30 mL  30 mL Oral Daily PRN Aviva Signs, MD      . metoprolol tartrate (LOPRESSOR) tablet 25 mg  25 mg Oral Q6H Kathie Dike, MD   25 mg at 03/08/15 1047  . ondansetron (ZOFRAN) tablet 4 mg  4 mg Oral Q6H PRN Phillips Grout, MD       Or  . ondansetron [facility]) injection 4 mg  4 mg Intravenous Q6H PRN Phillips Grout, MD   4 mg at 03/04/15 2146  . pantoprazole (PROTONIX) injection 40 mg  40 mg Intravenous Q24H Aviva Signs, MD   40 mg at 03/07/15 1515  . piperacillin-tazobactam (ZOSYN) IVPB 3.375 g  3.375 g Intravenous Q8H Kathie Dike, MD   3.375 g at 03/08/15 0727  . potassium & sodium phosphates (PHOS-NAK) 280-160-250 MG packet 1 packet  1 packet Oral TID WC & HS Kathie Dike, MD   1 packet at 03/08/15 1051  . simethicone (MYLICON) chewable tablet 40 mg  40 mg Oral Q6H PRN Aviva Signs, MD   40 mg at 03/05/15 1440  . sodium chloride 0.9 % injection 10-40 mL  10-40 mL Intracatheter Q12H Aviva Signs, MD   10 mL at 03/07/15 2255  . sodium chloride 0.9 % injection 10-40 mL  10-40 mL Intracatheter PRN Aviva Signs, MD      . sodium chloride 0.9 % injection 10-40 mL  10-40 mL Intracatheter Q12H Aviva Signs, MD   10 mL at 03/07/15 2254  . sodium chloride 0.9 % injection 10-40 mL  10-40 mL Intracatheter PRN Aviva Signs, MD      . sodium chloride 0.9 % injection 3 mL  3 mL Intravenous Q12H Phillips Grout, MD   3 mL at 03/07/15 2255  . sodium phosphate 20 mmol in dextrose 5 % 250 mL infusion  20 mmol Intravenous Once Albertine Patricia, MD   20 mmol at 03/08/15 [id number]     Discharge Medications: Please see discharge summary for a list of discharge medications.  Relevant Imaging Results:  Relevant Lab Results:  Recent Labs    Additional Information    Konstantine Gervasi, Clydene Pugh, LCSW

## 2015-03-08 NOTE — Progress Notes (Signed)
Patient Demographics  [name], is a 80 y.o. female, DOB - 01-28-1935, [id number]  Admit date - 03/02/2015   Admitting Physician Phillips Grout, MD  Outpatient Primary MD for the patient is [name]  LOS - 5   Chief Complaint  Patient presents with  . Abdominal Pain       Admission HPI/Brief narrative: 67 yow presented with complaints of LLQ abdominal pain with associated nausea. CT scan reveal perforated bowel. General surgery preformed partial colectomy 120/04 with no immediate complications. Her overall hospital course has been complicated by development of Afib. She has been started on rate control and since converted back to SR. Her CHADSVASc score is 6, cardiology consulted, patient started on liquids for anticoagulation. Subjective:   Heather Dost today has, No headache, No chest pain, No new weakness tingling or numbness, No Cough - SOB, reports abdominal pain improving , tolerating clear liquid diet .  Assessment & Plan    Principal Problem:   Perforated sigmoid colon ([place]) Active Problems:   HYPERCHOLESTEROLEMIA   Anxiety state   DIASTOLIC DYSFUNCTION   GERD   HTN (hypertension)   Coronary artery disease   Abdominal pain   Lactic acidosis   Prolonged Q-T interval on ECG   CKD (chronic kidney disease) stage 3, GFR 30-59 ml/min   AKI (acute kidney injury) ([place])   Atrial flutter ([place])   perforated sigmoid colon  - As evident on CT abdomen pelvis on 12/2 . - Surgical consult greatly appreciated, status post partial colectomy with colostomy/Hartmann procedure  on 03/05/2015 - Management per surgery, pain is well controlled, surgery advancing to full liquid diet today . - Continue with IV Zosyn as still having low-grade temperature   paroxysmal A. fib with RVR - Postoperative, currently converted to normal sinus rhythm, off Cardizem drip, started on metoprolol.  -  CHADSVASC=6 , initially on heparin drip, currently transitioned to Eliquis  Acute on chronic kidney  stage III  - Likely related to IV contrast, resolved with IV fluids   Hypokalemia/hypophosphatemia  - Repleting , check an a.m.   GERD  - Continue with PPI   Coronary artery disease  - Continue with aspirin and statin  Hypertension  - Uncontrolled, monitor as metoprolol was added today .   hyperlipidemia - Continue with statin  Code Status: full  Family Communication: discussed with patient  Disposition Plan: transferred to telemetry   Procedures  - partial colectomy with colostomy/Hartmann procedure  on 03/05/2015    Consults   Gen. surgery   cardiology    Medications  Scheduled Meds: . apixaban  5 mg Oral BID  . feeding supplement (ENSURE ENLIVE)  237 mL Oral BID BM  . guaiFENesin  1,200 mg Oral BID  . losartan  100 mg Oral Daily  . metoprolol tartrate  25 mg Oral BID  . pantoprazole  40 mg Oral Q1200  . piperacillin-tazobactam (ZOSYN)  IV  3.375 g Intravenous Q8H  . potassium & sodium phosphates  1 packet Oral TID WC & HS  . sodium chloride  10-40 mL Intracatheter Q12H  . sodium chloride  10-40 mL Intracatheter Q12H  . sodium chloride  3 mL Intravenous Q12H  . sodium phosphate  Dextrose 5% IVPB  20 mmol Intravenous Once   Continuous Infusions:  PRN Meds:.acetaminophen, ALPRAZolam, haloperidol lactate, hydrALAZINE, HYDROmorphone (DILAUDID) injection, LORazepam, magnesium hydroxide, ondansetron **OR** ondansetron (ZOFRAN) IV, simethicone, sodium chloride, sodium chloride  DVT Prophylaxis  Eliquis  Lab Results  Component Value Date   PLT 173 03/08/2015    Antibiotics    Anti-infectives    Start     Dose/Rate Route Frequency Ordered Stop   03/03/15 0900  piperacillin-tazobactam (ZOSYN) IVPB 3.375 g     3.375 g 12.5 mL/hr over 240 Minutes Intravenous Every 8 hours 03/03/15 0739     03/03/15 0100  piperacillin-tazobactam (ZOSYN) IVPB 3.375 g     3.375  g 100 mL/hr over 30 Minutes Intravenous  Once 03/03/15 0049 03/03/15 0121          Objective:   Filed Vitals:   03/08/15 1030 03/08/15 1100 03/08/15 1200 03/08/15 1300  BP:  165/120 171/72 165/62  Pulse: 66 65 63 58  Temp: 99.8 F (37.7 C) 99.7 F (37.6 C)    TempSrc:      Resp: 19 16 17 18   Height:      Weight:      SpO2: 97% 95% 98% 96%    Wt Readings from Last 3 Encounters:  03/08/15 74 kg (163 lb 2.3 oz)  02/14/15 67.042 kg (147 lb 12.8 oz)  02/10/15 67.132 kg (148 lb)     Intake/Output Summary (Last 24 hours) at 03/08/15 1502 Last data filed at 03/08/15 1200  Gross per 24 hour  Intake 3158.5 ml  Output   1188 ml  Net 1970.5 ml     Physical Exam  General: NAD. Appears calm and looks comfortable lying in bed.   Cardiovascular: Irregular   Respiratory: clear bilaterally, No wheezing, rales or rhonchi  Abdomen: soft, no distention, Colostomy noted, midline surgical scar covered with mesh, JP drain right lower abdomen  Musculoskeletal: No edema b/l  Psychiatric: Oriented to self, place, and reason for being in the hospital.    Data Review   Micro Results Recent Results (from the past 240 hour(s))  Culture, blood (routine x 2)     Status: None   Collection Time: 03/02/15 11:10 PM  Result Value Ref Range Status   Specimen Description BLOOD RIGHT HAND  Final   Special Requests BOTTLES DRAWN AEROBIC AND ANAEROBIC 4CC EACH  Final   Culture NO GROWTH 5 DAYS  Final   Report Status 03/07/2015 FINAL  Final  Culture, blood (routine x 2)     Status: None   Collection Time: 03/02/15 11:23 PM  Result Value Ref Range Status   Specimen Description BLOOD LEFT FOREARM  Final   Special Requests   Final    BOTTLES DRAWN AEROBIC AND ANAEROBIC AEB=4CC ANA=2CC   Culture NO GROWTH 5 DAYS  Final   Report Status 03/07/2015 FINAL  Final  Surgical pcr screen     Status: None   Collection Time: 03/03/15  2:45 AM  Result Value Ref Range Status   MRSA, PCR  NEGATIVE NEGATIVE Final   Staphylococcus aureus NEGATIVE NEGATIVE Final    Comment:        The Xpert SA Assay (FDA approved for NASAL specimens in patients over 9 years of age), is one component of a comprehensive surveillance program.  Test performance has been validated by [facility] for patients greater than or equal to 68 year old. It is not intended to diagnose infection nor to guide or monitor treatment.   MRSA PCR Screening  Status: None   Collection Time: 03/04/15  1:43 PM  Result Value Ref Range Status   MRSA by PCR NEGATIVE NEGATIVE Final    Comment:        The GeneXpert MRSA Assay (FDA approved for NASAL specimens only), is one component of a comprehensive MRSA colonization surveillance program. It is not intended to diagnose MRSA infection nor to guide or monitor treatment for MRSA infections.     Radiology Reports Dg Chest 2 View  02/10/2015  CLINICAL DATA:  Right flank pain with vomiting urinary frequency since this morning. History of melanoma and CHF and hypertension. EXAM: CHEST  2 VIEW COMPARISON:  05/07/2014 FINDINGS: Large hiatal hernia is similar to the prior study. Cardiac silhouette is mildly enlarged. No mediastinal or hilar masses or convincing adenopathy. Clear lungs.  No pleural effusion or pneumothorax. Bony thorax is demineralized but grossly intact. IMPRESSION: No acute cardiopulmonary disease. Electronically Signed   By: Lajean Manes M.D.   On: 02/10/2015 18:22   Ct Angio Chest Pe W/cm &/or Wo Cm  02/10/2015  CLINICAL DATA:  Nausea and vomiting and upper back pain. EXAM: CT ANGIOGRAPHY CHEST WITH CONTRAST TECHNIQUE: Multidetector CT imaging of the chest was performed using the standard protocol during bolus administration of intravenous contrast. Multiplanar CT image reconstructions and MIPs were obtained to evaluate the vascular anatomy. CONTRAST:  15mL OMNIPAQUE IOHEXOL 350 MG/ML SOLN COMPARISON:  Radiography same day FINDINGS: Pulmonary  arterial opacification is excellent. There are no pulmonary emboli. The aorta shows atherosclerosis but there is no aneurysm or dissection. There is extensive coronary artery calcification. There is a very large hiatal hernia apparently filled with ingested material. There is not much within the intra abdominal portion of the gastric antrum. This raises the question/ potential for some degree of obstruction. The esophagus proximal to that is dilated and fluid and air-filled. The lung parenchyma shows emphysema. There is compressive volume loss primarily in the right medial lower lung secondary to the large hiatal hernia. There is no pleural or pericardial fluid. Scans in the upper abdomen do not show any significant lesion. No significant bone finding. Review of the MIP images confirms the above findings. IMPRESSION: Large hiatal hernia filled with ingested material. There could possibly be relative outflow obstruction from this hernia given this appearance. No pulmonary emboli. Electronically Signed   By: Nelson Chimes M.D.   On: 02/10/2015 21:52   Ct Abdomen Pelvis W Contrast  03/03/2015  CLINICAL DATA:  Sudden onset abdominal pain in the left lower quadrant at 17:00. EXAM: CT ABDOMEN AND PELVIS WITH CONTRAST TECHNIQUE: Multidetector CT imaging of the abdomen and pelvis was performed using the standard protocol following bolus administration of intravenous contrast. CONTRAST:  117mL OMNIPAQUE IOHEXOL 300 MG/ML  SOLN COMPARISON:  02/10/2015 FINDINGS: There is a large volume of extraluminal air, predominantly retroperitoneal. The extraluminal air probably originates from a perforation of the mid sigmoid colon. There is no bowel obstruction. No abscess or drainable fluid collection is evident. Large hiatal hernia again noted. There are unremarkable appearances of the liver, spleen, pancreas, adrenals and kidneys except for a few benign renal cysts. Uterus and ovaries are unremarkable. Abdominal aorta is normal in  caliber with moderate atherosclerotic calcification. No significant abnormalities evident in the lower chest. No significant musculoskeletal lesion is evident. IMPRESSION: Large volume extraluminal air, predominantly retroperitoneal, probably originating from a perforation of the mid sigmoid colon. Critical Value/emergent results were called by telephone at the time of interpretation on 03/03/2015 at 12:45 am  to Dr. Tomi Bamberger , who verbally acknowledged these results. Electronically Signed   By: Andreas Newport M.D.   On: 03/03/2015 00:49   Dg Chest Port 1 View  03/05/2015  CLINICAL DATA:  Central line placement. EXAM: PORTABLE CHEST 1 VIEW COMPARISON:  03/04/2015 and 03/02/2015. FINDINGS: 1304 hours. New left subclavian central venous catheter projected laterally at the mid SVC level near the origin of the azygos vein. The heart size and mediastinal contours are stable with a known hiatal hernia. There is no pneumothorax. Medial right basilar airspace disease appears unchanged. The left lung is clear. IMPRESSION: Central venous catheter tip at the mid SVC level near the origin of the azygos vein. No pneumothorax. No significant change in medial right basilar airspace disease. Electronically Signed   By: Richardean Sale M.D.   On: 03/05/2015 14:15   Dg Chest Port 1 View  03/04/2015  CLINICAL DATA:  Hypoxia EXAM: PORTABLE CHEST 1 VIEW COMPARISON:  Two days ago FINDINGS: There is streaky right infrahilar lung opacity which is newly developed. No edema, effusion, or pneumothorax. Lung volumes are low. Chronic cardiomegaly with superimposed large hiatal hernia. IMPRESSION: New right middle lobe atelectasis or consolidation. Given gas-filled hiatal hernia, question underlying aspiration. Electronically Signed   By: Monte Fantasia M.D.   On: 03/04/2015 15:43   Dg Abd Acute W/chest  03/02/2015  CLINICAL DATA:  Left lower quadrant pain. Bloody diarrhea. Shortness of breath. EXAM: DG ABDOMEN ACUTE W/ 1V CHEST  COMPARISON:  None. FINDINGS: There is no evidence of dilated bowel loops or free intraperitoneal air. Large amount of stool throughout the colon. No radiopaque calculi or other significant radiographic abnormality is seen. Heart size and mediastinal contours are within normal limits. Both lungs are clear. IMPRESSION: Large amount of stool throughout the colon. No acute cardiopulmonary disease. Electronically Signed   By: Kathreen Devoid   On: 03/02/2015 23:15   Dg Duanne Limerick W/high Density W/kub  02/17/2015  CLINICAL DATA:  Patient status post partial reduction of esophageal large hiatal hernia. Patient has intermittent RIGHT-sided pain and vomiting. EXAM: UPPER GI SERIES WITH KUB TECHNIQUE: After obtaining a scout radiograph a routine upper GI series was performed using high-density FLUOROSCOPY TIME:  Radiation Exposure Index (as provided by the fluoroscopic device): If the device does not provide the exposure index: Fluoroscopy Time (in minutes and seconds):  1 minutes 48 seconds Number of Acquired Images:  15 COMPARISON:  CT 02/10/2015, upper GI 09/27/2014 FINDINGS: Initial KUB demonstrates surgical clips in the gastric cardia region. Administration of contrast demonstrate no stricture or mass within the esophagus. Contrast flowed into the stomach. There is a large hiatal hernia present with approximately 60% of the stomach above the diaphragm. No retained food stuff within the stomach. The herniated stomach is within the the central and RIGHT side of the mediastinum. The herniated stomach is patulous and retains barium but the barium eventually flows through the diaphragmatic hiatus into the gastric antrum. Ultimately contrast flows into the C-loop of the duodenum and ligament Treitz in the LEFT upper quadrant. There is some retention of contrast within the stomach at the end of the study. IMPRESSION: 1. Large sliding-type hiatal hernia which is patulous and retains oral contrast. 2. No evidence of high-grade  obstruction with contrast flowing into the proximal small bowel. 3. No evidence of esophageal obstruction. 4. No retained food stuff within the stomach at beginning of study. Electronically Signed   By: Suzy Bouchard M.D.   On: 02/17/2015 12:05   Ct  Renal Stone Study  02/10/2015  CLINICAL DATA:  Right flank pain since 4 p.m. today. Urinary frequency. Nausea and vomiting. EXAM: CT ABDOMEN AND PELVIS WITHOUT CONTRAST TECHNIQUE: Multidetector CT imaging of the abdomen and pelvis was performed following the standard protocol without IV contrast. COMPARISON:  None. FINDINGS: Lung bases: Large hiatal hernia. There are bowel anastomosis staples along the anterior margin of the herniated stomach. Minor subsegmental atelectasis in the right lower lobe adjacent to the hernia. 3-4 mm nodule in the lateral left lung base, image 7, series 6. Heart normal in size. There are coronary artery calcifications. Liver, spleen, gallbladder, pancreas, adrenal glands:  Unremarkable. There are surgical vascular clips adjacent to the pancreatic tail. Kidneys, ureters, bladder: No ureteral stone or obstructive uropathy. No collecting system dilation. Single 3-4 mm nonobstructing stone in the lower pole the right kidney. 4 mm nonobstructing stone in the lower pole of the left kidney. Bilateral low-density renal masses consistent with cysts. Ureters normal course and in caliber. Bladder is mostly decompressed but otherwise unremarkable. Uterus and adnexa:  Unremarkable. Lymph nodes:  No adenopathy. Ascites:  None. Gastrointestinal: Cecum extends across midline to the left central abdomen. There are few left colon diverticula. No diverticulitis. Colon otherwise unremarkable. Normal small bowel. Normal appendix visualized. Abdominal wall:  No significant hernia. Vascular: Atherosclerotic calcifications are noted along the aorta and its branch vessels. No aneurysm. Musculoskeletal: Disc and facet degenerative changes in the lower lumbar  spine. No osteoblastic or osteolytic lesions. IMPRESSION: 1. No acute findings. 2. No evidence of a ureteral stone or obstructive uropathy. No findings to explain the patient's right flank pain. 3. There are single nonobstructing intrarenal stones in each kidney as well as bilateral renal cysts. 4. Scattered left colon diverticula without diverticulitis. 5. Normal appendix visualized. 6. Large hiatal hernia. Electronically Signed   By: Lajean Manes M.D.   On: 02/10/2015 18:21     CBC  Recent Labs Lab 03/02/15 2045  03/04/15 0633 03/05/15 0223 03/06/15 0040 03/07/15 0420 03/08/15 0333  WBC 13.2*  < > 13.7* 9.8 10.8* 5.7 11.5*  HGB 12.8  < > 11.0* 10.7* 9.5* 8.9* 9.1*  HCT 38.7  < > 35.9* 33.4* 28.6* 26.5* 27.2*  PLT 189  < > 182 154 150 167 173  MCV 82.2  < > 86.5 83.5 80.8 80.1 79.1  MCH 27.2  < > 26.5 26.8 26.8 26.9 26.5  MCHC 33.1  < > 30.6 32.0 33.2 33.6 33.5  RDW 13.9  < > 15.5 15.1 14.6 15.0 15.0  LYMPHSABS 1.6  --   --   --   --   --   --   MONOABS 0.6  --   --   --   --   --   --   EOSABS 0.1  --   --   --   --   --   --   BASOSABS 0.0  --   --   --   --   --   --   < > = values in this interval not displayed.  Chemistries   Recent Labs Lab 03/02/15 2045  03/05/15 0223 03/06/15 0040 03/06/15 0801 03/06/15 1625 03/07/15 0420 03/08/15 0333  NA 137  < > 141 139 140  --  135 132*  K 3.3*  < > 3.7 2.9* 2.7* 3.8 4.3 4.2  CL 102  < > 114* 112* 111  --  107 104  CO2 21*  < > 19* 22 21*  --  23 23  GLUCOSE 121*  < > 121* 123* 123*  --  122* 112*  BUN 17  < > 33* 20 20  --  14 11  CREATININE 1.29*  < > 1.30* 0.91 0.92  --  0.79 0.77  CALCIUM 9.3  < > 7.0* 7.0* 7.3*  --  7.6* 7.7*  MG  --   < > 2.1 2.2 2.2  --  1.9 1.7  AST 21  --   --   --  24  --  22  --   ALT 14  --   --   --  19  --  16  --   ALKPHOS 59  --   --   --  65  --  64  --   BILITOT 0.9  --   --   --  0.9  --  0.5  --   < > = values in this interval not  displayed. ------------------------------------------------------------------------------------------------------------------ estimated creatinine clearance is 55.8 mL/min (by C-G formula based on Cr of 0.77). ------------------------------------------------------------------------------------------------------------------ No results for input(s): HGBA1C in the last 72 hours. ------------------------------------------------------------------------------------------------------------------ No results for input(s): CHOL, HDL, LDLCALC, TRIG, CHOLHDL, LDLDIRECT in the last 72 hours. ------------------------------------------------------------------------------------------------------------------ No results for input(s): TSH, T4TOTAL, T3FREE, THYROIDAB in the last 72 hours.  Invalid input(s): FREET3 ------------------------------------------------------------------------------------------------------------------ No results for input(s): VITAMINB12, FOLATE, FERRITIN, TIBC, IRON, RETICCTPCT in the last 72 hours.  Coagulation profile  Recent Labs Lab 03/02/15 2045 03/03/15 0626  INR 1.07 1.28    No results for input(s): DDIMER in the last 72 hours.  Cardiac Enzymes  Recent Labs Lab 03/04/15 1512 03/04/15 2128 03/05/15 0223  TROPONINI 0.03 0.05* 0.05*   ------------------------------------------------------------------------------------------------------------------ Invalid input(s): POCBNP     Time Spent in minutes   30 minutes   Sayid Moll M.D on 03/08/2015 at 3:02 PM  Between 7am to 7pm - Pager - [phone]  After 7pm go to [email or web address] - password [facility]  [facility]   Office  [phone]

## 2015-03-08 NOTE — Consult Note (Signed)
WOC met with family to provide initial education on ostomy care.  Patient's 2 daughters and 1 son present and very engaged in learning care.  Their dad has an ostomy (patient's ex husband) and they have some familiarity with the concept.  I have reviewed educational materials, we discussed pouch change, diet, activity, care. The patient continues to feel a little confused over the whole situation and we did not change the pouch today per her wishes to wait until she felt a little more "clear headed".  I demonstrated Lock and roll closure and explained the differences in a 2 pc and a 1pc pouch, abdominal anatomy and routine care needed for ostomy.  We discussed the appearance of the stoma and the children did visualize the stoma through the pouch at the bedside.  The patient requested some pain medication during my visits and became quite sleepy.  I continued to answer questions about the ostomy and the care of the stoma with the patient's children.  Answered questions on supplies, I will provide them with an [place] on my next visit to assist with needed supplies after DC. Discussed with family plans for short term rehab care and agreement that this will assist patient with getting stronger and perhaps learning more of her ostomy care.  Spent greater than 1 hour with family answering questions.  Planned additional teaching session with patient and family on Thursday at [place], [place]

## 2015-03-08 NOTE — Progress Notes (Signed)
Called report to [phone] RN and then carried by wheelchair to [phone] and left in care of staff.

## 2015-03-08 NOTE — Progress Notes (Signed)
3 Days Post-Op  Subjective: Patient sitting up in the chair and looking good. Answers questions appropriately.  Objective: Vital signs in last 24 hours: Temp:  [99 F (37.2 C)-99.9 F (37.7 C)] 99.7 F (37.6 C) (12/07 1100) Pulse Rate:  [57-74] 65 (12/07 1100) Resp:  [12-28] 16 (12/07 1100) BP: (114-180)/(54-132) 165/120 mmHg (12/07 1100) SpO2:  [94 %-100 %] 95 % (12/07 1100) Weight:  [74 kg (163 lb 2.3 oz)] 74 kg (163 lb 2.3 oz) (12/07 0600) Last BM Date:  (known)  Intake/Output from previous day: 12/06 0701 - 12/07 0700 In: 3176.5 [P.O.:680; I.V.:2096.5; IV Piggyback:400] Out: 2288 [Urine:2250; Drains:38] Intake/Output this shift: Total I/O In: 692 [P.O.:240; I.V.:52; IV Piggyback:400] Out: -   General appearance: cooperative and no distress GI: Incision healing well. Iodoform Nu Gauze removed. No purulent drainage noted. Ostomy pink with some fluid and gas present in bag. Positive bowel sounds appreciated. JP drainage serosanguineous in nature.  Lab Results:   Recent Labs  03/07/15 0420 03/08/15 0333  WBC 5.7 11.5*  HGB 8.9* 9.1*  HCT 26.5* 27.2*  PLT 167 173   BMET  Recent Labs  03/07/15 0420 03/08/15 0333  NA 135 132*  K 4.3 4.2  CL 107 104  CO2 23 23  GLUCOSE 122* 112*  BUN 14 11  CREATININE 0.79 0.77  CALCIUM 7.6* 7.7*   PT/INR No results for input(s): LABPROT, INR in the last 72 hours.  Studies/Results: No results found.  Anti-infectives: Anti-infectives    Start     Dose/Rate Route Frequency Ordered Stop   03/03/15 0900  piperacillin-tazobactam (ZOSYN) IVPB 3.375 g     3.375 g 12.5 mL/hr over 240 Minutes Intravenous Every 8 hours 03/03/15 0739     03/03/15 0100  piperacillin-tazobactam (ZOSYN) IVPB 3.375 g     3.375 g 100 mL/hr over 30 Minutes Intravenous  Once 03/03/15 0049 03/03/15 0121      Assessment/Plan: s/p Procedure(s): PARTIAL COLECTOMY WITH COLOSTOMY CREATION/HARTMANN PROCEDURE CENTRAL LINE INSERTION  continues to recover  well from surgery. Bowel function starting to return. Hypomagnesemia and hypophosphatemia being addressed. Cardiology is consulted concerning her atrial fibrillation. She is on a heparin drip and can be switched to by mouth and a quietAs needed. We'll advance to full liquid diet. Will transfer patient to regular floor as she has off of the diltiazem. She will be on telemetry.  LOS: 5 days    Orianna Biskup A 03/08/2015

## 2015-03-09 LAB — BASIC METABOLIC PANEL
Anion gap: 8 (ref 5–15)
BUN: 13 mg/dL (ref 6–20)
CHLORIDE: 101 mmol/L (ref 101–111)
CO2: 24 mmol/L (ref 22–32)
CREATININE: 0.77 mg/dL (ref 0.44–1.00)
Calcium: 7.8 mg/dL — ABNORMAL LOW (ref 8.9–10.3)
Glucose, Bld: 83 mg/dL (ref 65–99)
POTASSIUM: 3.7 mmol/L (ref 3.5–5.1)
SODIUM: 133 mmol/L — AB (ref 135–145)

## 2015-03-09 LAB — CBC
HCT: 27.1 % — ABNORMAL LOW (ref 36.0–46.0)
HEMOGLOBIN: 8.9 g/dL — AB (ref 12.0–15.0)
MCH: 26 pg (ref 26.0–34.0)
MCHC: 32.8 g/dL (ref 30.0–36.0)
MCV: 79.2 fL (ref 78.0–100.0)
PLATELETS: 225 10*3/uL (ref 150–400)
RBC: 3.42 MIL/uL — AB (ref 3.87–5.11)
RDW: 15 % (ref 11.5–15.5)
WBC: 17.8 10*3/uL — ABNORMAL HIGH (ref 4.0–10.5)

## 2015-03-09 LAB — HEPARIN LEVEL (UNFRACTIONATED): Heparin Unfractionated: 1.42 IU/mL (ref 0.30–0.70)

## 2015-03-09 LAB — PHOSPHORUS: PHOSPHORUS: 3.5 mg/dL (ref 2.5–4.6)

## 2015-03-09 LAB — MAGNESIUM: MAGNESIUM: 1.7 mg/dL (ref 1.7–2.4)

## 2015-03-09 MED ORDER — MAGNESIUM SULFATE IN D5W 10-5 MG/ML-% IV SOLN
1.0000 g | Freq: Once | INTRAVENOUS | Status: AC
  Administered 2015-03-09: 1 g via INTRAVENOUS
  Filled 2015-03-09: qty 100

## 2015-03-09 MED ORDER — METRONIDAZOLE IN NACL 5-0.79 MG/ML-% IV SOLN
500.0000 mg | Freq: Three times a day (TID) | INTRAVENOUS | Status: DC
  Administered 2015-03-09 – 2015-03-14 (×16): 500 mg via INTRAVENOUS
  Filled 2015-03-09 (×16): qty 100

## 2015-03-09 MED ORDER — POTASSIUM CHLORIDE CRYS ER 20 MEQ PO TBCR
40.0000 meq | EXTENDED_RELEASE_TABLET | Freq: Four times a day (QID) | ORAL | Status: AC
  Administered 2015-03-09 (×2): 40 meq via ORAL
  Filled 2015-03-09 (×2): qty 2

## 2015-03-09 MED ORDER — HEPARIN (PORCINE) IN NACL 100-0.45 UNIT/ML-% IJ SOLN
950.0000 [IU]/h | INTRAMUSCULAR | Status: DC
  Administered 2015-03-09: 1300 [IU]/h via INTRAVENOUS
  Administered 2015-03-10: 1100 [IU]/h via INTRAVENOUS
  Filled 2015-03-09 (×2): qty 250

## 2015-03-09 MED ORDER — ZOLPIDEM TARTRATE 5 MG PO TABS
5.0000 mg | ORAL_TABLET | Freq: Once | ORAL | Status: AC
  Administered 2015-03-09: 5 mg via ORAL
  Filled 2015-03-09: qty 1

## 2015-03-09 NOTE — Progress Notes (Signed)
ANTICOAGULATION CONSULT NOTE - follow up  Pharmacy Consult for Heparin  Indication: atrial fibrillation  Allergies  Allergen Reactions  . Sulfamethoxazole Rash    Patient Measurements: Height: 5\' 3"  (160 cm) Weight: 163 lb 2.3 oz (74 kg) IBW/kg (Calculated) : 52.4 HEPARIN DW (KG): 67.9   Vital Signs: Temp: 99 F (37.2 C) (12/08 2042) Temp Source: Oral (12/08 2042) BP: 123/58 mmHg (12/08 2042) Pulse Rate: 64 (12/08 2042)  Labs:  Recent Labs  03/07/15 0420 03/08/15 0333 03/09/15 0455 03/09/15 2058  HGB 8.9* 9.1* 8.9*  --   HCT 26.5* 27.2* 27.1*  --   PLT 167 173 225  --   HEPARINUNFRC  --  0.18*  --  1.42  CREATININE 0.79 0.77 0.77  --     Estimated Creatinine Clearance: 55.8 mL/min (by C-G formula based on Cr of 0.77).   Medical History: Past Medical History  Diagnosis Date  . Depressive disorder, not elsewhere classified   . Anxiety state, unspecified   . Pure hypercholesterolemia   . Esophageal reflux   . Infection of esophagostomy ([place])   . Congestive heart failure, unspecified     diastolic heart failure  . Pulmonary hypertension (HCC)     PA systolic pressure [id number] mmHg by echocardiogram, PA pressure 33/10 by cardiac catheterization PA saturation 62% thermodilution cardiac index 2.0 thick cardiac index 2.4  . Bradycardia     previously with coreg  . Tricuspid regurgitation     moderate tricuspid regurgitation by echocardiogram, prior use of anorexic agents  . Melanoma ([place]) 1995    removal on back   . Coronary artery disease     a. Moderate to severe coronary disease involving the left anterior descending artery and right coronary artery.  Sequential stenosis in the LAD is significant.  Right coronary artery is moderate to severely likely nonischemic. Questionable small LVOT obstruction.  No Brockenbrough maneuver was performed.  Catheterization January 2013; b. 05/2014 MV no isch/infarct, EF 60%.  Marland Kitchen PONV (postoperative nausea and vomiting)   .  Hypertension   . H/O hiatal hernia   . Gastritis   . [name]'s esophagus   . IBS (irritable bowel syndrome)   . Macular degeneration   . Dysrhythmia    Medications:  Scheduled:  . feeding supplement (ENSURE ENLIVE)  237 mL Oral BID BM  . guaiFENesin  1,200 mg Oral BID  . losartan  100 mg Oral Daily  . metoprolol tartrate  25 mg Oral BID  . metronidazole  500 mg Intravenous Q8H  . pantoprazole  40 mg Oral Q1200  . piperacillin-tazobactam (ZOSYN)  IV  3.375 g Intravenous Q8H  . potassium & sodium phosphates  1 packet Oral TID WC & HS  . sodium chloride  10-40 mL Intracatheter Q12H  . sodium chloride  10-40 mL Intracatheter Q12H  . sodium chloride  3 mL Intravenous Q12H    Assessment: Okay for Protocol, s/p Colectomy on 12/04. New Afib with chadsvasc score is 6.  Plan to restart heparin and hold off on eliquis. Concerned patient may require additional surgical intervention with WBC increasing. Heparin level 1.42, will hold infusion for 1 hour and restart at 1100 units/hr.  Goal of Therapy:  Heparin level 0.3-0.7 units/ml Monitor platelets by anticoagulation protocol: Yes   Plan:  No Bolus Hold x 1 hr and reStart heparin at 1100 units/hr at midnight 2400 Check anti-Xa level in 8 hours and daily while on heparin Continue to monitor H&H and platelets  Isac Sarna, BS Pharm  D, BCPS Clinical Pharmacist Pager [phone] 03/09/2015,11:04 PM

## 2015-03-09 NOTE — Clinical Social Work Note (Signed)
CSW provided patient with bed offers from [facility] and [facility] [facility].  Patient advised that she wanted to speak to her daughter this afternoon prior to making a decision.    Ihor Gully, [place] [phone]

## 2015-03-09 NOTE — Progress Notes (Signed)
Patient Demographics  [name], is a 80 y.o. female, DOB - [birth year], [id number]  Admit date - 03/02/2015   Admitting Physician Phillips Grout, MD  Outpatient Primary MD for the patient is [name]  LOS - 6   Chief Complaint  Patient presents with  . Abdominal Pain       Admission HPI/Brief narrative: 65 yow presented with complaints of LLQ abdominal pain with associated nausea. CT scan reveal perforated bowel. General surgery preformed partial colectomy 120/04 with no immediate complications. Her overall hospital course has been complicated by development of Afib. She has been started on rate control and since converted back to SR. Her CHADSVASc score is 6, cardiology consulted, and recommended to continue patient on anticoagulation. Subjective:   [name] today has, No headache, No chest pain, No new weakness tingling or numbness, No Cough - SOB, reports abdominal pain improving , tolerating full liquid diet .  Assessment & Plan    Principal Problem:   Perforated sigmoid colon ([place]) Active Problems:   HYPERCHOLESTEROLEMIA   Anxiety state   DIASTOLIC DYSFUNCTION   GERD   HTN (hypertension)   Coronary artery disease   Abdominal pain   Lactic acidosis   Prolonged Q-T interval on ECG   CKD (chronic kidney disease) stage 3, GFR 30-59 ml/min   AKI (acute kidney injury) ([place])   Atrial flutter ([place])   perforated sigmoid colon  - As evident on CT abdomen pelvis on 12/2 . - Surgical consult greatly appreciated, status post partial colectomy with colostomy/Hartmann procedure  on 03/05/2015 - Management per surgery, pain is well controlled, tolerating full liquid diet today . - continue having worsening leukocytosis, this is secondary to fecal contamination in the pelvis as per surgery , JP drain still putting out dark cloudy material ,so will continue with IV Zosyn, IV Flagyl  added on 12/8 by surgery, recheck CBC in a.m.Marland Kitchen   paroxysmal A. fib with RVR - Postoperative, currently converted to normal sinus rhythm, off Cardizem drip, started on metoprolol.  - CHADSVASC=6  Started on eliquis, back on heparin GTT, pending further workup.  Acute on chronic kidney  stage III  - Likely related to IV contrast, resolved with IV fluids   Hypokalemia/hypophosphatemia  - Repleted  GERD  - Continue with PPI   Coronary artery disease  - Continue with aspirin and statin  Hypertension  - Uncontrolled, monitor as metoprolol was added today .   hyperlipidemia - Continue with statin  Code Status: full  Family Communication: discussed with patient  Disposition Plan: transferred to telemetry   Procedures  - partial colectomy with colostomy/Hartmann procedure  on 03/05/2015    Consults   Gen. surgery   cardiology    Medications  Scheduled Meds: . feeding supplement (ENSURE ENLIVE)  237 mL Oral BID BM  . guaiFENesin  1,200 mg Oral BID  . losartan  100 mg Oral Daily  . metoprolol tartrate  25 mg Oral BID  . metronidazole  500 mg Intravenous Q8H  . pantoprazole  40 mg Oral Q1200  . piperacillin-tazobactam (ZOSYN)  IV  3.375 g Intravenous Q8H  . potassium & sodium phosphates  1 packet Oral TID WC & HS  . potassium chloride  40  mEq Oral Q6H  . sodium chloride  10-40 mL Intracatheter Q12H  . sodium chloride  10-40 mL Intracatheter Q12H  . sodium chloride  3 mL Intravenous Q12H   Continuous Infusions:  PRN Meds:.acetaminophen, ALPRAZolam, haloperidol lactate, hydrALAZINE, HYDROmorphone (DILAUDID) injection, LORazepam, magnesium hydroxide, ondansetron **OR** ondansetron (ZOFRAN) IV, simethicone, sodium chloride, sodium chloride  DVT Prophylaxis  Eliquis  Lab Results  Component Value Date   PLT 225 03/09/2015    Antibiotics    Anti-infectives    Start     Dose/Rate Route Frequency Ordered Stop   03/09/15 0930  metroNIDAZOLE (FLAGYL) IVPB 500 mg      500 mg 100 mL/hr over 60 Minutes Intravenous Every 8 hours 03/09/15 0919     03/03/15 0900  piperacillin-tazobactam (ZOSYN) IVPB 3.375 g     3.375 g 12.5 mL/hr over 240 Minutes Intravenous Every 8 hours 03/03/15 0739     03/03/15 0100  piperacillin-tazobactam (ZOSYN) IVPB 3.375 g     3.375 g 100 mL/hr over 30 Minutes Intravenous  Once 03/03/15 0049 03/03/15 0121          Objective:   Filed Vitals:   03/08/15 1500 03/08/15 1600 03/08/15 2038 03/09/15 0432  BP: 177/62 163/65 157/55 162/61  Pulse: 62 63 80 76  Temp:   97.3 F (36.3 C) 97.8 F (36.6 C)  TempSrc:   Oral Oral  Resp: 15 15 16 16   Height:      Weight:      SpO2: 96% 95% 97% 95%    Wt Readings from Last 3 Encounters:  03/08/15 74 kg (163 lb 2.3 oz)  02/14/15 67.042 kg (147 lb 12.8 oz)  02/10/15 67.132 kg (148 lb)     Intake/Output Summary (Last 24 hours) at 03/09/15 1325 Last data filed at 03/09/15 1111  Gross per 24 hour  Intake    290 ml  Output    290 ml  Net      0 ml     Physical Exam  General: NAD. Appears calm and looks comfortable lying in bed.   Cardiovascular: Irregular   Respiratory: clear bilaterally, No wheezing, rales or rhonchi  Abdomen: soft, no distention, Colostomy noted, midline surgical scar covered with mesh, JP drain right lower abdomen  Musculoskeletal: No edema b/l  Psychiatric: Oriented to self, place, and reason for being in the hospital.    Data Review   Micro Results Recent Results (from the past 240 hour(s))  Culture, blood (routine x 2)     Status: None   Collection Time: 03/02/15 11:10 PM  Result Value Ref Range Status   Specimen Description BLOOD RIGHT HAND  Final   Special Requests BOTTLES DRAWN AEROBIC AND ANAEROBIC 4CC EACH  Final   Culture NO GROWTH 5 DAYS  Final   Report Status 03/07/2015 FINAL  Final  Culture, blood (routine x 2)     Status: None   Collection Time: 03/02/15 11:23 PM  Result Value Ref Range Status   Specimen Description  BLOOD LEFT FOREARM  Final   Special Requests   Final    BOTTLES DRAWN AEROBIC AND ANAEROBIC AEB=4CC ANA=2CC   Culture NO GROWTH 5 DAYS  Final   Report Status 03/07/2015 FINAL  Final  Surgical pcr screen     Status: None   Collection Time: 03/03/15  2:45 AM  Result Value Ref Range Status   MRSA, PCR NEGATIVE NEGATIVE Final   Staphylococcus aureus NEGATIVE NEGATIVE Final    Comment:  The Xpert SA Assay (FDA approved for NASAL specimens in patients over 23 years of age), is one component of a comprehensive surveillance program.  Test performance has been validated by [facility] for patients greater than or equal to 68 year old. It is not intended to diagnose infection nor to guide or monitor treatment.   MRSA PCR Screening     Status: None   Collection Time: 03/04/15  1:43 PM  Result Value Ref Range Status   MRSA by PCR NEGATIVE NEGATIVE Final    Comment:        The GeneXpert MRSA Assay (FDA approved for NASAL specimens only), is one component of a comprehensive MRSA colonization surveillance program. It is not intended to diagnose MRSA infection nor to guide or monitor treatment for MRSA infections.     Radiology Reports Dg Chest 2 View  02/10/2015  CLINICAL DATA:  Right flank pain with vomiting urinary frequency since this morning. History of melanoma and CHF and hypertension. EXAM: CHEST  2 VIEW COMPARISON:  05/07/2014 FINDINGS: Large hiatal hernia is similar to the prior study. Cardiac silhouette is mildly enlarged. No mediastinal or hilar masses or convincing adenopathy. Clear lungs.  No pleural effusion or pneumothorax. Bony thorax is demineralized but grossly intact. IMPRESSION: No acute cardiopulmonary disease. Electronically Signed   By: Lajean Manes M.D.   On: 02/10/2015 18:22   Ct Angio Chest Pe W/cm &/or Wo Cm  02/10/2015  CLINICAL DATA:  Nausea and vomiting and upper back pain. EXAM: CT ANGIOGRAPHY CHEST WITH CONTRAST TECHNIQUE: Multidetector CT  imaging of the chest was performed using the standard protocol during bolus administration of intravenous contrast. Multiplanar CT image reconstructions and MIPs were obtained to evaluate the vascular anatomy. CONTRAST:  3mL OMNIPAQUE IOHEXOL 350 MG/ML SOLN COMPARISON:  Radiography same day FINDINGS: Pulmonary arterial opacification is excellent. There are no pulmonary emboli. The aorta shows atherosclerosis but there is no aneurysm or dissection. There is extensive coronary artery calcification. There is a very large hiatal hernia apparently filled with ingested material. There is not much within the intra abdominal portion of the gastric antrum. This raises the question/ potential for some degree of obstruction. The esophagus proximal to that is dilated and fluid and air-filled. The lung parenchyma shows emphysema. There is compressive volume loss primarily in the right medial lower lung secondary to the large hiatal hernia. There is no pleural or pericardial fluid. Scans in the upper abdomen do not show any significant lesion. No significant bone finding. Review of the MIP images confirms the above findings. IMPRESSION: Large hiatal hernia filled with ingested material. There could possibly be relative outflow obstruction from this hernia given this appearance. No pulmonary emboli. Electronically Signed   By: Nelson Chimes M.D.   On: 02/10/2015 21:52   Ct Abdomen Pelvis W Contrast  03/03/2015  CLINICAL DATA:  Sudden onset abdominal pain in the left lower quadrant at 17:00. EXAM: CT ABDOMEN AND PELVIS WITH CONTRAST TECHNIQUE: Multidetector CT imaging of the abdomen and pelvis was performed using the standard protocol following bolus administration of intravenous contrast. CONTRAST:  18mL OMNIPAQUE IOHEXOL 300 MG/ML  SOLN COMPARISON:  02/10/2015 FINDINGS: There is a large volume of extraluminal air, predominantly retroperitoneal. The extraluminal air probably originates from a perforation of the mid sigmoid  colon. There is no bowel obstruction. No abscess or drainable fluid collection is evident. Large hiatal hernia again noted. There are unremarkable appearances of the liver, spleen, pancreas, adrenals and kidneys except for a few benign  renal cysts. Uterus and ovaries are unremarkable. Abdominal aorta is normal in caliber with moderate atherosclerotic calcification. No significant abnormalities evident in the lower chest. No significant musculoskeletal lesion is evident. IMPRESSION: Large volume extraluminal air, predominantly retroperitoneal, probably originating from a perforation of the mid sigmoid colon. Critical Value/emergent results were called by telephone at the time of interpretation on 03/03/2015 at 12:45 am to Dr. Tomi Bamberger , who verbally acknowledged these results. Electronically Signed   By: Andreas Newport M.D.   On: 03/03/2015 00:49   Dg Chest Port 1 View  03/05/2015  CLINICAL DATA:  Central line placement. EXAM: PORTABLE CHEST 1 VIEW COMPARISON:  03/04/2015 and 03/02/2015. FINDINGS: 1304 hours. New left subclavian central venous catheter projected laterally at the mid SVC level near the origin of the azygos vein. The heart size and mediastinal contours are stable with a known hiatal hernia. There is no pneumothorax. Medial right basilar airspace disease appears unchanged. The left lung is clear. IMPRESSION: Central venous catheter tip at the mid SVC level near the origin of the azygos vein. No pneumothorax. No significant change in medial right basilar airspace disease. Electronically Signed   By: Richardean Sale M.D.   On: 03/05/2015 14:15   Dg Chest Port 1 View  03/04/2015  CLINICAL DATA:  Hypoxia EXAM: PORTABLE CHEST 1 VIEW COMPARISON:  Two days ago FINDINGS: There is streaky right infrahilar lung opacity which is newly developed. No edema, effusion, or pneumothorax. Lung volumes are low. Chronic cardiomegaly with superimposed large hiatal hernia. IMPRESSION: New right middle lobe atelectasis  or consolidation. Given gas-filled hiatal hernia, question underlying aspiration. Electronically Signed   By: Monte Fantasia M.D.   On: 03/04/2015 15:43   Dg Abd Acute W/chest  03/02/2015  CLINICAL DATA:  Left lower quadrant pain. Bloody diarrhea. Shortness of breath. EXAM: DG ABDOMEN ACUTE W/ 1V CHEST COMPARISON:  None. FINDINGS: There is no evidence of dilated bowel loops or free intraperitoneal air. Large amount of stool throughout the colon. No radiopaque calculi or other significant radiographic abnormality is seen. Heart size and mediastinal contours are within normal limits. Both lungs are clear. IMPRESSION: Large amount of stool throughout the colon. No acute cardiopulmonary disease. Electronically Signed   By: Kathreen Devoid   On: 03/02/2015 23:15   Dg Duanne Limerick W/high Density W/kub  02/17/2015  CLINICAL DATA:  Patient status post partial reduction of esophageal large hiatal hernia. Patient has intermittent RIGHT-sided pain and vomiting. EXAM: UPPER GI SERIES WITH KUB TECHNIQUE: After obtaining a scout radiograph a routine upper GI series was performed using high-density FLUOROSCOPY TIME:  Radiation Exposure Index (as provided by the fluoroscopic device): If the device does not provide the exposure index: Fluoroscopy Time (in minutes and seconds):  1 minutes 48 seconds Number of Acquired Images:  15 COMPARISON:  CT 02/10/2015, upper GI 09/27/2014 FINDINGS: Initial KUB demonstrates surgical clips in the gastric cardia region. Administration of contrast demonstrate no stricture or mass within the esophagus. Contrast flowed into the stomach. There is a large hiatal hernia present with approximately 60% of the stomach above the diaphragm. No retained food stuff within the stomach. The herniated stomach is within the the central and RIGHT side of the mediastinum. The herniated stomach is patulous and retains barium but the barium eventually flows through the diaphragmatic hiatus into the gastric antrum.  Ultimately contrast flows into the C-loop of the duodenum and ligament Treitz in the LEFT upper quadrant. There is some retention of contrast within the stomach at the end  of the study. IMPRESSION: 1. Large sliding-type hiatal hernia which is patulous and retains oral contrast. 2. No evidence of high-grade obstruction with contrast flowing into the proximal small bowel. 3. No evidence of esophageal obstruction. 4. No retained food stuff within the stomach at beginning of study. Electronically Signed   By: Suzy Bouchard M.D.   On: 02/17/2015 12:05   Ct Renal Stone Study  02/10/2015  CLINICAL DATA:  Right flank pain since 4 p.m. today. Urinary frequency. Nausea and vomiting. EXAM: CT ABDOMEN AND PELVIS WITHOUT CONTRAST TECHNIQUE: Multidetector CT imaging of the abdomen and pelvis was performed following the standard protocol without IV contrast. COMPARISON:  None. FINDINGS: Lung bases: Large hiatal hernia. There are bowel anastomosis staples along the anterior margin of the herniated stomach. Minor subsegmental atelectasis in the right lower lobe adjacent to the hernia. 3-4 mm nodule in the lateral left lung base, image 7, series 6. Heart normal in size. There are coronary artery calcifications. Liver, spleen, gallbladder, pancreas, adrenal glands:  Unremarkable. There are surgical vascular clips adjacent to the pancreatic tail. Kidneys, ureters, bladder: No ureteral stone or obstructive uropathy. No collecting system dilation. Single 3-4 mm nonobstructing stone in the lower pole the right kidney. 4 mm nonobstructing stone in the lower pole of the left kidney. Bilateral low-density renal masses consistent with cysts. Ureters normal course and in caliber. Bladder is mostly decompressed but otherwise unremarkable. Uterus and adnexa:  Unremarkable. Lymph nodes:  No adenopathy. Ascites:  None. Gastrointestinal: Cecum extends across midline to the left central abdomen. There are few left colon diverticula. No  diverticulitis. Colon otherwise unremarkable. Normal small bowel. Normal appendix visualized. Abdominal wall:  No significant hernia. Vascular: Atherosclerotic calcifications are noted along the aorta and its branch vessels. No aneurysm. Musculoskeletal: Disc and facet degenerative changes in the lower lumbar spine. No osteoblastic or osteolytic lesions. IMPRESSION: 1. No acute findings. 2. No evidence of a ureteral stone or obstructive uropathy. No findings to explain the patient's right flank pain. 3. There are single nonobstructing intrarenal stones in each kidney as well as bilateral renal cysts. 4. Scattered left colon diverticula without diverticulitis. 5. Normal appendix visualized. 6. Large hiatal hernia. Electronically Signed   By: Lajean Manes M.D.   On: 02/10/2015 18:21     CBC  Recent Labs Lab 03/02/15 2045  03/05/15 0223 03/06/15 0040 03/07/15 0420 03/08/15 0333 03/09/15 0455  WBC 13.2*  < > 9.8 10.8* 5.7 11.5* 17.8*  HGB 12.8  < > 10.7* 9.5* 8.9* 9.1* 8.9*  HCT 38.7  < > 33.4* 28.6* 26.5* 27.2* 27.1*  PLT 189  < > 154 150 167 173 225  MCV 82.2  < > 83.5 80.8 80.1 79.1 79.2  MCH 27.2  < > 26.8 26.8 26.9 26.5 26.0  MCHC 33.1  < > 32.0 33.2 33.6 33.5 32.8  RDW 13.9  < > 15.1 14.6 15.0 15.0 15.0  LYMPHSABS 1.6  --   --   --   --   --   --   MONOABS 0.6  --   --   --   --   --   --   EOSABS 0.1  --   --   --   --   --   --   BASOSABS 0.0  --   --   --   --   --   --   < > = values in this interval not displayed.  Chemistries   Recent Labs Lab 03/02/15  2045  03/06/15 0040 03/06/15 0801 03/06/15 1625 03/07/15 0420 03/08/15 0333 03/09/15 0455  NA 137  < > 139 140  --  135 132* 133*  K 3.3*  < > 2.9* 2.7* 3.8 4.3 4.2 3.7  CL 102  < > 112* 111  --  107 104 101  CO2 21*  < > 22 21*  --  23 23 24   GLUCOSE 121*  < > 123* 123*  --  122* 112* 83  BUN 17  < > 20 20  --  14 11 13   CREATININE 1.29*  < > 0.91 0.92  --  0.79 0.77 0.77  CALCIUM 9.3  < > 7.0* 7.3*  --  7.6* 7.7*  7.8*  MG  --   < > 2.2 2.2  --  1.9 1.7 1.7  AST 21  --   --  24  --  22  --   --   ALT 14  --   --  19  --  16  --   --   ALKPHOS 59  --   --  65  --  64  --   --   BILITOT 0.9  --   --  0.9  --  0.5  --   --   < > = values in this interval not displayed. ------------------------------------------------------------------------------------------------------------------ estimated creatinine clearance is 55.8 mL/min (by C-G formula based on Cr of 0.77). ------------------------------------------------------------------------------------------------------------------ No results for input(s): HGBA1C in the last 72 hours. ------------------------------------------------------------------------------------------------------------------ No results for input(s): CHOL, HDL, LDLCALC, TRIG, CHOLHDL, LDLDIRECT in the last 72 hours. ------------------------------------------------------------------------------------------------------------------ No results for input(s): TSH, T4TOTAL, T3FREE, THYROIDAB in the last 72 hours.  Invalid input(s): FREET3 ------------------------------------------------------------------------------------------------------------------ No results for input(s): VITAMINB12, FOLATE, FERRITIN, TIBC, IRON, RETICCTPCT in the last 72 hours.  Coagulation profile  Recent Labs Lab 03/02/15 2045 03/03/15 0626  INR 1.07 1.28    No results for input(s): DDIMER in the last 72 hours.  Cardiac Enzymes  Recent Labs Lab 03/04/15 1512 03/04/15 2128 03/05/15 0223  TROPONINI 0.03 0.05* 0.05*   ------------------------------------------------------------------------------------------------------------------ Invalid input(s): POCBNP     Time Spent in minutes   30 minutes   Jenevieve Kirschbaum M.D on 03/09/2015 at 1:25 PM  Between 7am to 7pm - Pager - [phone]  After 7pm go to [email or web address] - password [email or web address]  [facility]   Office  [phone]

## 2015-03-09 NOTE — Progress Notes (Signed)
Physical Therapy Treatment Patient Details Name: [name] [place] MRN: [id number] DOB: 07/27/1934 Today's Date: 03/09/2015    History of Present Illness 80 yo female h/o CAD, hiatal hernia, several months of epigastric /back pain being seen and work up by GI as outpatient comes in tonight with a different kind of abdominal pain new and severe since around 4p in the LLQ mostly of her abdomen with associated nausea no vomiting. No fevers. No chills. No diarrhea. The pain was sudden onset and has been persistent since around 4pm. Ct shows perforated bowel. Dr Arnoldo Morale called with general surgery by EDP. Pt has not had any vomiting since arrival, pain was better with dilaudid in the ED, but is now again a 9/10. Pt referred for admission for perforated bowel.    PT Comments    Pt states that she doesn't feel very well but nothing specific-just general malaise.  She is now off of supplemental O2 with O2 sat=94-95% at rest and with activity.  She was instructed in gait with a walker and was able to ambulate 25' with good stability.  She was extremely tired from this small amount of activity but was able to do some very gentle seated exercise.  Follow Up Recommendations  SNF     Equipment Recommendations  None recommended by PT    Recommendations for Other Services  none     Precautions / Restrictions Precautions Precautions: Fall Restrictions Weight Bearing Restrictions: No    Mobility  Bed Mobility Overal bed mobility: Modified Independent       Supine to sit: HOB elevated        Transfers Overall transfer level: Needs assistance Equipment used: Rolling walker (2 wheeled) Transfers: Sit to/from Stand Sit to Stand: Supervision            Ambulation/Gait Ambulation/Gait assistance: Supervision Ambulation Distance (Feet): 25 Feet Assistive device: Rolling walker (2 wheeled) Gait Pattern/deviations: Trunk flexed;WFL(Within Functional Limits) Gait velocity: appropriate  for situation Gait velocity interpretation: <1.8 ft/sec, indicative of risk for recurrent falls     Stairs            Wheelchair Mobility    Modified Rankin (Stroke Patients Only)       Balance Overall balance assessment: No apparent balance deficits (not formally assessed)                                  Cognition Arousal/Alertness: Awake/alert Behavior During Therapy: WFL for tasks assessed/performed Overall Cognitive Status: Within Functional Limits for tasks assessed                      Exercises General Exercises - Lower Extremity Ankle Circles/Pumps: AROM;Both;10 reps;Seated Quad Sets: AROM;Both;10 reps;Seated Gluteal Sets: AROM;Both;10 reps;Seated Long Arc Quad: AROM;Both;10 reps;Seated    General Comments        Pertinent Vitals/Pain Pain Assessment: No/denies pain    Home Living                      Prior Function            PT Goals (current goals can now be found in the care plan section) Progress towards PT goals: Progressing toward goals    Frequency  Min 3X/week    PT Plan Current plan remains appropriate    Co-evaluation             End of Session Equipment  Utilized During Treatment: Gait belt Activity Tolerance: Patient tolerated treatment well Patient left: in chair;with call bell/phone within reach     Time: 0854-0920 PT Time Calculation (min) (ACUTE ONLY): 26 min  Charges:  $Gait Training: 8-22 mins $Therapeutic Exercise: 8-22 mins                    G CodesSable Feil  PT 03/09/2015, 9:26 AM [phone]

## 2015-03-09 NOTE — Care Management Note (Signed)
[facility] Management Note  Patient Details  Name: [name] MRN: [id number] Date of Birth: [birth year]  Subjective/Objective:                    Action/Plan:   Expected Discharge Date:     03/10/2015             Expected Discharge Plan:  Skilled Nursing Facility  In-House Referral:  Clinical Social Work  Discharge planning Services  CM Consult  Post Acute Care Choice:  NA Choice offered to:  NA  DME Arranged:    DME Agency:     HH Arranged:    [place] Agency:     Status of Service:  In process, will continue to follow  Medicare Important Message Given:  Yes Date Medicare IM Given:    Medicare IM give by:    Date Additional Medicare IM Given:    Additional Medicare Important Message give by:     If discussed at [place] of Stay Meetings, dates discussed:  03/09/2015  Additional Comments:  Sherald Barge, RN 03/09/2015, 11:27 AM

## 2015-03-09 NOTE — Clinical Social Work Placement (Signed)
   CLINICAL SOCIAL WORK PLACEMENT  NOTE  Date:  03/09/2015  Patient Details  Name: [name] MRN: [id number] Date of Birth: 1934/11/17  Clinical Social Work is seeking post-discharge placement for this patient at the [place] level of care (*CSW will initial, date and re-position this form in  chart as items are completed):  Yes   Patient/family provided with [place] Work Department's list of facilities offering this level of care within the geographic area requested by the patient (or if unable, by the patient's family).  Yes   Patient/family informed of their freedom to choose among providers that offer the needed level of care, that participate in Medicare, Medicaid or managed care program needed by the patient, have an available bed and are willing to accept the patient.  Yes   Patient/family informed of [facility]'s ownership interest in [facility] and [facility], as well as of the fact that they are under no obligation to receive care at these facilities.  PASRR submitted to EDS on 03/09/15     PASRR number received on       Existing PASRR number confirmed on       FL2 transmitted to all facilities in geographic area requested by pt/family on       FL2 transmitted to all facilities within larger geographic area on       Patient informed that his/her managed care company has contracts with or will negotiate with certain facilities, including the following:        Yes   Patient/family informed of bed offers received.  Patient chooses bed at       Physician recommends and patient chooses bed at      Patient to be transferred to   on  .  Patient to be transferred to facility by       Patient family notified on   of transfer.  Name of family member notified:        PHYSICIAN       Additional Comment:    _______________________________________________ Ihor Gully, LCSW 03/09/2015, 11:05 AM [phone]

## 2015-03-09 NOTE — Progress Notes (Signed)
4 Days Post-Op  Subjective: Patient seems more alert today. She denies any significant abdominal pain.  Objective: Vital signs in last 24 hours: Temp:  [97.3 F (36.3 C)-99.8 F (37.7 C)] 97.8 F (36.6 C) (12/08 0432) Pulse Rate:  [58-80] 76 (12/08 0432) Resp:  [13-19] 16 (12/08 0432) BP: (157-177)/(55-120) 162/61 mmHg (12/08 0432) SpO2:  [94 %-98 %] 95 % (12/08 0432) Last BM Date: 03/08/15  Intake/Output from previous day: 12/07 0701 - 12/08 0700 In: 1582 [P.O.:480; I.V.:52; IV Piggyback:450] Out: 250 [Urine:250] Intake/Output this shift:    General appearance: cooperative, appears stated age and no distress GI: Soft, incision healing well. Ostomy pink and patent. Bowel sounds appreciated.  Lab Results:   Recent Labs  03/08/15 0333 03/09/15 0455  WBC 11.5* 17.8*  HGB 9.1* 8.9*  HCT 27.2* 27.1*  PLT 173 225   BMET  Recent Labs  03/08/15 0333 03/09/15 0455  NA 132* 133*  K 4.2 3.7  CL 104 101  CO2 23 24  GLUCOSE 112* 83  BUN 11 13  CREATININE 0.77 0.77  CALCIUM 7.7* 7.8*   PT/INR No results for input(s): LABPROT, INR in the last 72 hours.  Studies/Results: No results found.  Anti-infectives: Anti-infectives    Start     Dose/Rate Route Frequency Ordered Stop   03/09/15 0930  metroNIDAZOLE (FLAGYL) IVPB 500 mg     500 mg 100 mL/hr over 60 Minutes Intravenous Every 8 hours 03/09/15 0919     03/03/15 0900  piperacillin-tazobactam (ZOSYN) IVPB 3.375 g     3.375 g 12.5 mL/hr over 240 Minutes Intravenous Every 8 hours 03/03/15 0739     03/03/15 0100  piperacillin-tazobactam (ZOSYN) IVPB 3.375 g     3.375 g 100 mL/hr over 30 Minutes Intravenous  Once 03/03/15 0049 03/03/15 0121      Assessment/Plan: s/p Procedure(s): PARTIAL COLECTOMY WITH COLOSTOMY CREATION/HARTMANN PROCEDURE CENTRAL LINE INSERTION Impression: Continues to slowly improve. The leukocytosis is secondary to fecal contamination in the pelvis. The JP drain is still putting out dark  cloudy material.  Hypophosphatemia has resolved. We'll add Flagyl to IV antibiotics. Repeat CBC in a.m.  LOS: 6 days    Amedee Cerrone A 03/09/2015

## 2015-03-09 NOTE — Progress Notes (Signed)
ANTICOAGULATION CONSULT NOTE - restart  Pharmacy Consult for Heparin  Indication: atrial fibrillation  Allergies  Allergen Reactions  . Sulfamethoxazole Rash    Patient Measurements: Height: 5\' 3"  (160 cm) Weight: 163 lb 2.3 oz (74 kg) IBW/kg (Calculated) : 52.4 HEPARIN DW (KG): 67.9   Vital Signs: Temp: 97.8 F (36.6 C) (12/08 0432) Temp Source: Oral (12/08 0432) BP: 162/61 mmHg (12/08 0432) Pulse Rate: 76 (12/08 0432)  Labs:  Recent Labs  03/07/15 0420 03/08/15 0333 03/09/15 0455  HGB 8.9* 9.1* 8.9*  HCT 26.5* 27.2* 27.1*  PLT 167 173 225  HEPARINUNFRC  --  0.18*  --   CREATININE 0.79 0.77 0.77    Estimated Creatinine Clearance: 55.8 mL/min (by C-G formula based on Cr of 0.77).   Medical History: Past Medical History  Diagnosis Date  . Depressive disorder, not elsewhere classified   . Anxiety state, unspecified   . Pure hypercholesterolemia   . Esophageal reflux   . Infection of esophagostomy ([place])   . Congestive heart failure, unspecified     diastolic heart failure  . Pulmonary hypertension (HCC)     PA systolic pressure [id number] mmHg by echocardiogram, PA pressure 33/10 by cardiac catheterization PA saturation 62% thermodilution cardiac index 2.0 thick cardiac index 2.4  . Bradycardia     previously with coreg  . Tricuspid regurgitation     moderate tricuspid regurgitation by echocardiogram, prior use of anorexic agents  . Melanoma ([place]) 1995    removal on back   . Coronary artery disease     a. Moderate to severe coronary disease involving the left anterior descending artery and right coronary artery.  Sequential stenosis in the LAD is significant.  Right coronary artery is moderate to severely likely nonischemic. Questionable small LVOT obstruction.  No Brockenbrough maneuver was performed.  Catheterization January 2013; b. 05/2014 MV no isch/infarct, EF 60%.  Marland Kitchen PONV (postoperative nausea and vomiting)   . Hypertension   . H/O hiatal hernia   .  Gastritis   . [name]'s esophagus   . IBS (irritable bowel syndrome)   . Macular degeneration   . Dysrhythmia    Medications:  Scheduled:  . feeding supplement (ENSURE ENLIVE)  237 mL Oral BID BM  . guaiFENesin  1,200 mg Oral BID  . losartan  100 mg Oral Daily  . metoprolol tartrate  25 mg Oral BID  . metronidazole  500 mg Intravenous Q8H  . pantoprazole  40 mg Oral Q1200  . piperacillin-tazobactam (ZOSYN)  IV  3.375 g Intravenous Q8H  . potassium & sodium phosphates  1 packet Oral TID WC & HS  . sodium chloride  10-40 mL Intracatheter Q12H  . sodium chloride  10-40 mL Intracatheter Q12H  . sodium chloride  3 mL Intravenous Q12H    Assessment: Okay for Protocol, s/p Colectomy on 12/04. New Afib with chadsvasc score is 6.  Plan to restart heparin and hold off on eliquis. Concerned patient may require additional surgical intervention with WBC increasing.   Goal of Therapy:  Heparin level 0.3-0.7 units/ml Monitor platelets by anticoagulation protocol: Yes   Plan:  No Bolus Start heparin at 1300 units/hr Check anti-Xa level in 8 hours and daily while on heparin Continue to monitor H&H and platelets  Isac Sarna, BS [place], BCPS Clinical Pharmacist Pager [phone] 03/09/2015,1:33 PM

## 2015-03-09 NOTE — Progress Notes (Signed)
Heparin drip held, will restart at midnight at new rate of 11 ml/hr per pharmacy instruction.

## 2015-03-09 NOTE — Progress Notes (Signed)
Patient passed a large amount of soft brown stool, colostomy bag changed.

## 2015-03-09 NOTE — Consult Note (Signed)
WOC ostomy follow up Stoma type/location:  LLQ, end colostomy Stomal assessment/size: 1 3/8", pink, moist, slightly budded however os points straight downward at 6 o'clock and the stoma is not budded at this area, additionally she has a crease in her abdomen at this same place.  Peristomal assessment: intact Treatment options for stomal/peristomal skin: added 2" barrier ring today for skin crease and dip with os directional Output brown, pasty stool  Ostomy pouching: 2pc. Placed today, I think this will be easier for the patient to manage, but I am fearful that it will not be flexible enough to maintain a seal with the abdominal crease and the direction of the os will be a little more challenging.  I have explained this to the patient and the family.  I have ordered additional 1pc flat flexible pouches and barrier rings for the staff to use if the 2pc is not a good option over the next 24 hours as she is more mobile.   Education provided:  Long educational session with 2 daughters and son present at the bedside. I demonstrated pouch change to the patient and family. Mrs. Flam did watch but seems a bit overwhelmed as does the family. Discussion on the pouch choices and I brought several different pouches to show the family (filtered, beige and closed end).  Provided the family with [place] catalog which I marked with both 2 pc and 1pc options.  Provided support to patient and family regarding need for rehab stay and suggested the family make sure that ostomy teaching was part of her goals and POC once they arrive at SNF of their choice.  Allowed patient and family to attach pouch to the wafer and practice Lock and Roll closure with new pouch prior to placement, however Mrs. Maulden was not able to attach pouch to wafer once the wafer was secured to her abdomen.  Will continue to support patient and family, if patient remains inpatient through the weekend I will plan to see them again on Monday.  Provided family  with my contact information.  Enrolled patient in [place] Start Discharge program: Yes   [place] team will remain available as needed for support with ostomy care.  Marielle Mantione [place] RN,CWOCN [phone]

## 2015-03-10 ENCOUNTER — Inpatient Hospital Stay (HOSPITAL_COMMUNITY): Payer: Medicare Other

## 2015-03-10 ENCOUNTER — Encounter (HOSPITAL_COMMUNITY): Payer: Medicare Other

## 2015-03-10 LAB — BASIC METABOLIC PANEL
Anion gap: 8 (ref 5–15)
BUN: 11 mg/dL (ref 6–20)
CO2: 25 mmol/L (ref 22–32)
Calcium: 7.9 mg/dL — ABNORMAL LOW (ref 8.9–10.3)
Chloride: 101 mmol/L (ref 101–111)
Creatinine, Ser: 0.96 mg/dL (ref 0.44–1.00)
GFR calc Af Amer: >60 mL/min (ref >60)
GFR, EST NON AFRICAN AMERICAN: 55 mL/min — AB (ref >60)
GLUCOSE: 110 mg/dL — AB (ref 65–99)
POTASSIUM: 4.1 mmol/L (ref 3.5–5.1)
Sodium: 134 mmol/L — ABNORMAL LOW (ref 135–145)

## 2015-03-10 LAB — HEPARIN LEVEL (UNFRACTIONATED)
HEPARIN UNFRACTIONATED: 0.64 [IU]/mL (ref 0.30–0.70)
Heparin Unfractionated: 1.14 IU/mL — ABNORMAL HIGH (ref 0.30–0.70)

## 2015-03-10 LAB — CBC
HEMATOCRIT: 26.5 % — AB (ref 36.0–46.0)
HEMOGLOBIN: 8.8 g/dL — AB (ref 12.0–15.0)
MCH: 26.4 pg (ref 26.0–34.0)
MCHC: 33.2 g/dL (ref 30.0–36.0)
MCV: 79.6 fL (ref 78.0–100.0)
Platelets: 299 10*3/uL (ref 150–400)
RBC: 3.33 MIL/uL — ABNORMAL LOW (ref 3.87–5.11)
RDW: 14.7 % (ref 11.5–15.5)
WBC: 18.8 10*3/uL — ABNORMAL HIGH (ref 4.0–10.5)

## 2015-03-10 MED ORDER — VANCOMYCIN HCL 10 G IV SOLR
1500.0000 mg | Freq: Once | INTRAVENOUS | Status: AC
  Administered 2015-03-10: 1500 mg via INTRAVENOUS
  Filled 2015-03-10: qty 1500

## 2015-03-10 MED ORDER — SODIUM CHLORIDE 0.9 % IJ SOLN
10.0000 mL | INTRAMUSCULAR | Status: DC | PRN
  Administered 2015-03-10: 70 mL
  Filled 2015-03-10: qty 40

## 2015-03-10 MED ORDER — AMLODIPINE BESYLATE 5 MG PO TABS
5.0000 mg | ORAL_TABLET | Freq: Every day | ORAL | Status: DC
Start: 2015-03-10 — End: 2015-03-14
  Administered 2015-03-10 – 2015-03-14 (×5): 5 mg via ORAL
  Filled 2015-03-10 (×5): qty 1

## 2015-03-10 MED ORDER — HYDROMORPHONE HCL 1 MG/ML IJ SOLN
0.5000 mg | INTRAMUSCULAR | Status: DC | PRN
  Administered 2015-03-11 – 2015-03-14 (×3): 0.5 mg via INTRAVENOUS
  Filled 2015-03-10 (×3): qty 1

## 2015-03-10 MED ORDER — IOHEXOL 300 MG/ML  SOLN
100.0000 mL | Freq: Once | INTRAMUSCULAR | Status: AC | PRN
  Administered 2015-03-10: 100 mL via INTRAVENOUS

## 2015-03-10 MED ORDER — SODIUM CHLORIDE 0.9 % IV SOLN
INTRAVENOUS | Status: DC
Start: 2015-03-10 — End: 2015-03-14
  Administered 2015-03-10 – 2015-03-13 (×5): via INTRAVENOUS

## 2015-03-10 MED ORDER — SODIUM CHLORIDE 0.9 % IJ SOLN
10.0000 mL | Freq: Two times a day (BID) | INTRAMUSCULAR | Status: DC
  Administered 2015-03-10: 20 mL
  Administered 2015-03-10 – 2015-03-14 (×6): 10 mL

## 2015-03-10 NOTE — Care Management Important Message (Signed)
Important Message  Patient Details  Name: [name] MRN: [id number] Date of Birth: 1934-08-12   Medicare Important Message Given:  Yes    Joylene Draft, RN 03/10/2015, 2:37 PM

## 2015-03-10 NOTE — Progress Notes (Signed)
Okay to give 10 AM dose of metoprolol per MD care order instruction.

## 2015-03-10 NOTE — Care Management Note (Signed)
[facility] Management Note  Patient Details  Name: Porfirio Oar [place] MRN: [id number] Date of Birth: 01/26/1935  Subjective/Objective:                    Action/Plan:   Expected Discharge Date:                  Expected Discharge Plan:  Skilled Nursing Facility  In-House Referral:  Clinical Social Work  Discharge planning Services  CM Consult  Post Acute Care Choice:  NA Choice offered to:  NA  DME Arranged:    DME Agency:     HH Arranged:    [place] Agency:     Status of Service:  Completed, signed off  Medicare Important Message Given:  Yes Date Medicare IM Given:    Medicare IM give by:    Date Additional Medicare IM Given:    Additional Medicare Important Message give by:     If discussed at [place] of Stay Meetings, dates discussed:    Additional Comments: Pt with elevated WBC. Repeating CT abd today. PICC line placed. Pt to discharge to SNF at discharge. CSW to arrange discharge when medically stable. Christinia Gully [place], RN 03/10/2015, 2:38 PM

## 2015-03-10 NOTE — Progress Notes (Signed)
ANTICOAGULATION CONSULT NOTE - follow up  Antibiotic- Initial consult  Pharmacy Consult for Heparin  Vancomycin  Indication: atrial fibrillation, r/o sepsis  Allergies  Allergen Reactions  . Sulfamethoxazole Rash    Patient Measurements: Height: 5\' 3"  (160 cm) Weight: 160 lb 4.8 oz (72.712 kg) IBW/kg (Calculated) : 52.4 HEPARIN DW (KG): 67.9   Vital Signs: Temp: 98.9 F (37.2 C) (12/09 0500) Temp Source: Oral (12/09 0500) BP: 176/70 mmHg (12/09 0500) Pulse Rate: 76 (12/09 0500)  Labs:  Recent Labs  03/08/15 0333 03/09/15 0455 03/09/15 2058 03/10/15 0813  HGB 9.1* 8.9*  --  8.8*  HCT 27.2* 27.1*  --  26.5*  PLT 173 225  --  299  HEPARINUNFRC 0.18*  --  1.42 1.14*  CREATININE 0.77 0.77  --  0.96    Estimated Creatinine Clearance: 46.1 mL/min (by C-G formula based on Cr of 0.96).   Medical History: Past Medical History  Diagnosis Date  . Depressive disorder, not elsewhere classified   . Anxiety state, unspecified   . Pure hypercholesterolemia   . Esophageal reflux   . Infection of esophagostomy ([place])   . Congestive heart failure, unspecified     diastolic heart failure  . Pulmonary hypertension (HCC)     PA systolic pressure [id number] mmHg by echocardiogram, PA pressure 33/10 by cardiac catheterization PA saturation 62% thermodilution cardiac index 2.0 thick cardiac index 2.4  . Bradycardia     previously with coreg  . Tricuspid regurgitation     moderate tricuspid regurgitation by echocardiogram, prior use of anorexic agents  . Melanoma ([place]) 1995    removal on back   . Coronary artery disease     a. Moderate to severe coronary disease involving the left anterior descending artery and right coronary artery.  Sequential stenosis in the LAD is significant.  Right coronary artery is moderate to severely likely nonischemic. Questionable small LVOT obstruction.  No Brockenbrough maneuver was performed.  Catheterization January 2013; b. 05/2014 MV no isch/infarct, EF  60%.  Marland Kitchen PONV (postoperative nausea and vomiting)   . Hypertension   . H/O hiatal hernia   . Gastritis   . [name]'s esophagus   . IBS (irritable bowel syndrome)   . Macular degeneration   . Dysrhythmia    Medications:  Scheduled:  . amLODipine  5 mg Oral Daily  . feeding supplement (ENSURE ENLIVE)  237 mL Oral BID BM  . guaiFENesin  1,200 mg Oral BID  . losartan  100 mg Oral Daily  . metoprolol tartrate  25 mg Oral BID  . metronidazole  500 mg Intravenous Q8H  . pantoprazole  40 mg Oral Q1200  . piperacillin-tazobactam (ZOSYN)  IV  3.375 g Intravenous Q8H  . potassium & sodium phosphates  1 packet Oral TID WC & HS  . sodium chloride  10-40 mL Intracatheter Q12H  . sodium chloride  10-40 mL Intracatheter Q12H  . sodium chloride  3 mL Intravenous Q12H  . vancomycin  1,500 mg Intravenous Once    Assessment: s/p Colectomy on 12/04. New Afib with chadsvasc score is 6. Heparin level this am remains elevated  Hg low but appears stable She will also add vancomycin to antibiotic regimen for r/o sepsis Goal of Therapy:  Vanc trough 15-20 mg/L Heparin level 0.3-0.7 units/ml Monitor platelets by anticoagulation protocol: Yes   Plan:  Decrease heparin to 950 units/hr Check anti-Xa level in 6 hours Vancomycin 1500 mg IV X 1 then 750 mg IV q12 hours F/u renal function,  cultures and clinical course  Thanks for allowing pharmacy to be a part of this patient's care.  Excell Seltzer, PharmD Clinical Pharmacist  03/10/2015,10:13 AM

## 2015-03-10 NOTE — Progress Notes (Signed)
Patient BP elevated this AM.  MD notified via [place].  Waiting for response.  Will continue to monitor.

## 2015-03-10 NOTE — Progress Notes (Signed)
Physical Therapy Treatment Patient Details Name: [name] MRN: [id number] DOB: 06/30/1934 Today's Date: 03/10/2015    History of Present Illness 80 yo female h/o CAD, hiatal hernia, several months of epigastric /back pain being seen and work up by GI as outpatient comes in tonight with a different kind of abdominal pain new and severe since around 4p in the LLQ mostly of her abdomen with associated nausea no vomiting. No fevers. No chills. No diarrhea. The pain was sudden onset and has been persistent since around 4pm. Ct shows perforated bowel. Dr Arnoldo Morale called with general surgery by EDP. Pt has not had any vomiting since arrival, pain was better with dilaudid in the ED, but is now again a 9/10. Pt referred for admission for perforated bowel.    PT Comments    Pt c/o significant fatigue and needed encouragement to just get up and walk.  She finally agreed and was able to transfer OOB independently.  She continues to need a walker to stabilize gait and she was able to ambulate 44' with good stability.  She was instructed in increased thoracic extension.  Follow Up Recommendations  SNF     Equipment Recommendations  None recommended by PT    Recommendations for Other Services  none     Precautions / Restrictions Precautions Precautions: Fall Restrictions Weight Bearing Restrictions: No    Mobility  Bed Mobility Overal bed mobility: Modified Independent Bed Mobility: Supine to Sit              Transfers Overall transfer level: Needs assistance Equipment used: Rolling walker (2 wheeled) Transfers: Sit to/from Stand Sit to Stand: Supervision         General transfer comment: has some difficulty rising from commode...instructed in anterior weight shift  Ambulation/Gait Ambulation/Gait assistance: Supervision Ambulation Distance (Feet): 80 Feet Assistive device: Rolling walker (2 wheeled) Gait Pattern/deviations: WFL(Within Functional Limits) Gait  velocity: appropriate for situation Gait velocity interpretation: <1.8 ft/sec, indicative of risk for recurrent falls     Stairs            Wheelchair Mobility    Modified Rankin (Stroke Patients Only)       Balance Overall balance assessment: No apparent balance deficits (not formally assessed)                                  Cognition Arousal/Alertness: Awake/alert Behavior During Therapy: WFL for tasks assessed/performed Overall Cognitive Status: Within Functional Limits for tasks assessed                      Exercises      General Comments        Pertinent Vitals/Pain Pain Assessment: No/denies pain    Home Living                      Prior Function            PT Goals (current goals can now be found in the care plan section) Progress towards PT goals: Progressing toward goals    Frequency  Min 3X/week    PT Plan Current plan remains appropriate    Co-evaluation             End of Session Equipment Utilized During Treatment: Gait belt Activity Tolerance: Patient tolerated treatment well Patient left: in chair;with call bell/phone within reach;with family/visitor present     Time:  [id number] PT Time Calculation (min) (ACUTE ONLY): 25 min  Charges:  $Gait Training: 8-22 mins                    G CodesSable Feil  PT 03/10/2015, 1:18 PM [phone]

## 2015-03-10 NOTE — Progress Notes (Signed)
5 Days Post-Op  Subjective: No complaints of pain. Is aware that she will be placed in skilled nursing unit for rehabilitation.  Objective: Vital signs in last 24 hours: Temp:  [97.9 F (36.6 C)-99.4 F (37.4 C)] 98.9 F (37.2 C) (12/09 0500) Pulse Rate:  [64-76] 76 (12/09 0500) Resp:  [15-16] 15 (12/08 2042) BP: (123-176)/(57-70) 176/70 mmHg (12/09 0500) SpO2:  [91 %-98 %] 93 % (12/09 0500) Weight:  [72.712 kg (160 lb 4.8 oz)] 72.712 kg (160 lb 4.8 oz) (12/09 0600) Last BM Date: 03/09/15  Intake/Output from previous day: 12/08 0701 - 12/09 0700 In: 720 [P.O.:720] Out: 1170 [Urine:300; Drains:70; Stool:800] Intake/Output this shift:    General appearance: cooperative and no distress GI: Soft. Incision healing well. JP drainage is clearing. Ostomy pink and patent.  Lab Results:   Recent Labs  03/08/15 0333 03/09/15 0455  WBC 11.5* 17.8*  HGB 9.1* 8.9*  HCT 27.2* 27.1*  PLT 173 225   BMET  Recent Labs  03/08/15 0333 03/09/15 0455  NA 132* 133*  K 4.2 3.7  CL 104 101  CO2 23 24  GLUCOSE 112* 83  BUN 11 13  CREATININE 0.77 0.77  CALCIUM 7.7* 7.8*   PT/INR No results for input(s): LABPROT, INR in the last 72 hours.  Studies/Results: No results found.  Anti-infectives: Anti-infectives    Start     Dose/Rate Route Frequency Ordered Stop   03/09/15 0930  metroNIDAZOLE (FLAGYL) IVPB 500 mg     500 mg 100 mL/hr over 60 Minutes Intravenous Every 8 hours 03/09/15 0919     03/03/15 0900  piperacillin-tazobactam (ZOSYN) IVPB 3.375 g     3.375 g 12.5 mL/hr over 240 Minutes Intravenous Every 8 hours 03/03/15 0739     03/03/15 0100  piperacillin-tazobactam (ZOSYN) IVPB 3.375 g     3.375 g 100 mL/hr over 30 Minutes Intravenous  Once 03/03/15 0049 03/03/15 0121      Assessment/Plan: s/p Procedure(s): PARTIAL COLECTOMY WITH COLOSTOMY CREATION/HARTMANN PROCEDURE CENTRAL LINE INSERTION Impression: Continues to recover well from emergent surgery. Atrial  fibrillation still being addressed by internal medicine. Labs are still pending from today. We'll advance to soft diet.  LOS: 7 days    Randi Poullard A 03/10/2015

## 2015-03-10 NOTE — Progress Notes (Signed)
[place] for Heparin   Indication: atrial fibrillation  Allergies  Allergen Reactions  . Sulfamethoxazole Rash    Patient Measurements: Height: 5\' 3"  (160 cm) Weight: 160 lb 4.8 oz (72.712 kg) IBW/kg (Calculated) : 52.4 HEPARIN DW (KG): 67.9   Vital Signs: Temp: 98.8 F (37.1 C) (12/09 1452) Temp Source: Oral (12/09 1452) BP: 136/102 mmHg (12/09 1452) Pulse Rate: 74 (12/09 1452)  Labs:  Recent Labs  03/08/15 0333 03/09/15 0455 03/09/15 2058 03/10/15 0813 03/10/15 1631  HGB 9.1* 8.9*  --  8.8*  --   HCT 27.2* 27.1*  --  26.5*  --   PLT 173 225  --  299  --   HEPARINUNFRC 0.18*  --  1.42 1.14* 0.64  CREATININE 0.77 0.77  --  0.96  --     Estimated Creatinine Clearance: 46.1 mL/min (by C-G formula based on Cr of 0.96).   Medical History: Past Medical History  Diagnosis Date  . Depressive disorder, not elsewhere classified   . Anxiety state, unspecified   . Pure hypercholesterolemia   . Esophageal reflux   . Infection of esophagostomy ([place])   . Congestive heart failure, unspecified     diastolic heart failure  . Pulmonary hypertension (HCC)     PA systolic pressure [id number] mmHg by echocardiogram, PA pressure 33/10 by cardiac catheterization PA saturation 62% thermodilution cardiac index 2.0 thick cardiac index 2.4  . Bradycardia     previously with coreg  . Tricuspid regurgitation     moderate tricuspid regurgitation by echocardiogram, prior use of anorexic agents  . Melanoma ([place]) 1995    removal on back   . Coronary artery disease     a. Moderate to severe coronary disease involving the left anterior descending artery and right coronary artery.  Sequential stenosis in the LAD is significant.  Right coronary artery is moderate to severely likely nonischemic. Questionable small LVOT obstruction.  No Brockenbrough maneuver was performed.  Catheterization January 2013; b. 05/2014 MV no isch/infarct, EF 60%.  Marland Kitchen PONV  (postoperative nausea and vomiting)   . Hypertension   . H/O hiatal hernia   . Gastritis   . [name]'s esophagus   . IBS (irritable bowel syndrome)   . Macular degeneration   . Dysrhythmia    Medications:  Scheduled:  . amLODipine  5 mg Oral Daily  . feeding supplement (ENSURE ENLIVE)  237 mL Oral BID BM  . guaiFENesin  1,200 mg Oral BID  . losartan  100 mg Oral Daily  . metoprolol tartrate  25 mg Oral BID  . metronidazole  500 mg Intravenous Q8H  . pantoprazole  40 mg Oral Q1200  . piperacillin-tazobactam (ZOSYN)  IV  3.375 g Intravenous Q8H  . potassium & sodium phosphates  1 packet Oral TID WC & HS  . sodium chloride  10-40 mL Intracatheter Q12H  . sodium chloride  10-40 mL Intracatheter Q12H  . sodium chloride  10-40 mL Intracatheter Q12H  . sodium chloride  3 mL Intravenous Q12H    Assessment: s/p Colectomy on 12/04. New Afib with chadsvasc score is 6. Heparin level this afternoon is therapeutic Goal of Therapy:  Heparin level 0.3-0.7 units/ml Monitor platelets by anticoagulation protocol: Yes   Plan:  Continue heparin at 950 units/hr Check anti-Xa level with am labs  Thanks for allowing pharmacy to be a part of this patient's care.  Excell Seltzer, PharmD Clinical Pharmacist  03/10/2015,6:41 PM

## 2015-03-10 NOTE — Progress Notes (Signed)
Peripherally Inserted Central Catheter/Midline Placement  The IV Nurse has discussed with the patient and/or persons authorized to consent for the patient, the purpose of this procedure and the potential benefits and risks involved with this procedure.  The benefits include less needle sticks, lab draws from the catheter and patient may be discharged home with the catheter.  Risks include, but not limited to, infection, bleeding, blood clot (thrombus formation), and puncture of an artery; nerve damage and irregular heat beat.  Alternatives to this procedure were also discussed.  PICC/Midline Placement Documentation  PICC Double Lumen [id number] PICC Right Basilic 37 cm 0 cm (Active)  Indication for Insertion or Continuance of Line Limited venous access - need for IV therapy >5 days (PICC only) 03/10/2015  2:13 PM  Exposed Catheter (cm) 0 cm 03/10/2015  2:13 PM  Site Assessment Clean;Intact;Dry 03/10/2015  2:13 PM  Lumen #1 Status Flushed;Capped (Central line);Blood return noted 03/10/2015  2:13 PM  Lumen #2 Status Flushed;Capped (Central line);Blood return noted 03/10/2015  2:13 PM  Dressing Type Transparent;Securing device 03/10/2015  2:13 PM  Dressing Status Clean;Dry;Antimicrobial disc in place;Intact 03/10/2015  2:13 PM  Line Care Connections checked and tightened 03/10/2015  2:13 PM  Line Adjustment (NICU/IV Team Only) No 03/10/2015  2:13 PM  Dressing Intervention New dressing 03/10/2015  2:13 PM  Dressing Change Due 03/17/15 03/10/2015  2:13 PM       Moriah Loughry, Essie Hart 03/10/2015, 2:14 PM

## 2015-03-10 NOTE — Progress Notes (Addendum)
Patient Demographics  [name], is a 80 y.o. female, DOB - 16-Nov-1934, [id number]  Admit date - 03/02/2015   Admitting Physician Phillips Grout, MD  Outpatient Primary MD for the patient is [name]  LOS - 7   Chief Complaint  Patient presents with  . Abdominal Pain       Admission HPI/Brief narrative: 49 yow presented with complaints of LLQ abdominal pain with associated nausea. CT scan reveal perforated bowel. General surgery preformed partial colectomy 120/04 with no immediate complications. Her overall hospital course has been complicated by development of Afib. She has been started on rate control and since converted back to SR. Her CHADSVASc score is 6, cardiology consulted, and recommended to continue patient on anticoagulation.  Subjective:   [name] today has, No headache, No chest pain, No new weakness tingling or numbness, No Cough - SOB, reports abdominal pain improving , tolerating full liquid diet .  Assessment & Plan    Principal Problem:   Perforated sigmoid colon ([place]) Active Problems:   HYPERCHOLESTEROLEMIA   Anxiety state   DIASTOLIC DYSFUNCTION   GERD   HTN (hypertension)   Coronary artery disease   Abdominal pain   Lactic acidosis   Prolonged Q-T interval on ECG   CKD (chronic kidney disease) stage 3, GFR 30-59 ml/min   AKI (acute kidney injury) ([place])   Atrial flutter ([place])   perforated sigmoid colon  - As evident on CT abdomen pelvis on 12/2 . - Surgical consult greatly appreciated, status post partial colectomy with colostomy/Hartmann procedure  on 03/05/2015 - Management per surgery, pain is well controlled, tolerating full liquid diet , advance to soft diet . - continue having worsening leukocytosis, this is secondary to fecal contamination in the pelvis as per surgery , JP drain still putting out dark cloudy material ,so will continue with  IV Zosyn, IV Flagyl added on 12/8 by surgery, CT abdomen pelvis with IV contrast pending today for further evaluation, as well  discontinue patient left subclavian TLC, will send blood cultures, will start empirically on IV vancomycin and blood culture results.   paroxysmal A. fib with RVR - Postoperative, currently converted to normal sinus rhythm, off Cardizem drip, started on metoprolol.  - CHADSVASC=6  Started on eliquis, back on heparin GTT, pending further workup.  Acute on chronic kidney  stage III  - Likely related to IV contrast, resolved with IV fluids   Hypokalemia/hypophosphatemia  - Repleted  GERD  - Continue with PPI   Coronary artery disease  - Continue with aspirin and statin  Hypertension  - Uncontrolled, continue with metoprolol and losartan, will add amlodipine.   hyperlipidemia - Continue with statin  Code Status: full  Family Communication: discussed with patient  Disposition Plan: SNF when stable   Procedures  - partial colectomy with colostomy/Hartmann procedure  on 03/05/2015    Consults   Gen. surgery   cardiology    Medications  Scheduled Meds: . feeding supplement (ENSURE ENLIVE)  237 mL Oral BID BM  . guaiFENesin  1,200 mg Oral BID  . losartan  100 mg Oral Daily  . metoprolol tartrate  25 mg Oral BID  . metronidazole  500 mg Intravenous Q8H  . pantoprazole  40 mg Oral Q1200  . piperacillin-tazobactam (ZOSYN)  IV  3.375 g Intravenous Q8H  . potassium & sodium phosphates  1 packet Oral TID WC & HS  . sodium chloride  10-40 mL Intracatheter Q12H  . sodium chloride  10-40 mL Intracatheter Q12H  . sodium chloride  3 mL Intravenous Q12H   Continuous Infusions: . heparin 1,100 Units/hr (03/10/15 0826)   PRN Meds:.acetaminophen, ALPRAZolam, haloperidol lactate, hydrALAZINE, HYDROmorphone (DILAUDID) injection, LORazepam, magnesium hydroxide, ondansetron **OR** ondansetron (ZOFRAN) IV, simethicone, sodium chloride, sodium chloride  DVT  Prophylaxis  Heparin GTT  Lab Results  Component Value Date   PLT 299 03/10/2015    Antibiotics    Anti-infectives    Start     Dose/Rate Route Frequency Ordered Stop   03/09/15 0930  metroNIDAZOLE (FLAGYL) IVPB 500 mg     500 mg 100 mL/hr over 60 Minutes Intravenous Every 8 hours 03/09/15 0919     03/03/15 0900  piperacillin-tazobactam (ZOSYN) IVPB 3.375 g     3.375 g 12.5 mL/hr over 240 Minutes Intravenous Every 8 hours 03/03/15 0739     03/03/15 0100  piperacillin-tazobactam (ZOSYN) IVPB 3.375 g     3.375 g 100 mL/hr over 30 Minutes Intravenous  Once 03/03/15 0049 03/03/15 0121          Objective:   Filed Vitals:   03/09/15 2029 03/09/15 2042 03/10/15 0500 03/10/15 0600  BP: 164/64 123/58 176/70   Pulse: 76 64 76   Temp: 99.4 F (37.4 C) 99 F (37.2 C) 98.9 F (37.2 C)   TempSrc: Oral Oral Oral   Resp: 15 15    Height:      Weight:    72.712 kg (160 lb 4.8 oz)  SpO2: 91% 98% 93%     Wt Readings from Last 3 Encounters:  03/10/15 72.712 kg (160 lb 4.8 oz)  02/14/15 67.042 kg (147 lb 12.8 oz)  02/10/15 67.132 kg (148 lb)     Intake/Output Summary (Last 24 hours) at 03/10/15 0955 Last data filed at 03/10/15 0936  Gross per 24 hour  Intake    600 ml  Output   1170 ml  Net   -570 ml     Physical Exam  General: NAD. Appears calm and looks comfortable lying in bed.   Cardiovascular: Irregular   Respiratory: clear bilaterally, No wheezing, rales or rhonchi  Abdomen: soft, no distention, Colostomy noted, midline surgical scar covered with mesh, JP drain right lower abdomen  Musculoskeletal: No edema b/l  Psychiatric: Oriented to self, place, and reason for being in the hospital.    Data Review   Micro Results Recent Results (from the past 240 hour(s))  Culture, blood (routine x 2)     Status: None   Collection Time: 03/02/15 11:10 PM  Result Value Ref Range Status   Specimen Description BLOOD RIGHT HAND  Final   Special Requests  BOTTLES DRAWN AEROBIC AND ANAEROBIC 4CC EACH  Final   Culture NO GROWTH 5 DAYS  Final   Report Status 03/07/2015 FINAL  Final  Culture, blood (routine x 2)     Status: None   Collection Time: 03/02/15 11:23 PM  Result Value Ref Range Status   Specimen Description BLOOD LEFT FOREARM  Final   Special Requests   Final    BOTTLES DRAWN AEROBIC AND ANAEROBIC AEB=4CC ANA=2CC   Culture NO GROWTH 5 DAYS  Final   Report Status 03/07/2015 FINAL  Final  Surgical pcr screen     Status:  None   Collection Time: 03/03/15  2:45 AM  Result Value Ref Range Status   MRSA, PCR NEGATIVE NEGATIVE Final   Staphylococcus aureus NEGATIVE NEGATIVE Final    Comment:        The Xpert SA Assay (FDA approved for NASAL specimens in patients over 25 years of age), is one component of a comprehensive surveillance program.  Test performance has been validated by [facility] for patients greater than or equal to 2 year old. It is not intended to diagnose infection nor to guide or monitor treatment.   MRSA PCR Screening     Status: None   Collection Time: 03/04/15  1:43 PM  Result Value Ref Range Status   MRSA by PCR NEGATIVE NEGATIVE Final    Comment:        The GeneXpert MRSA Assay (FDA approved for NASAL specimens only), is one component of a comprehensive MRSA colonization surveillance program. It is not intended to diagnose MRSA infection nor to guide or monitor treatment for MRSA infections.     Radiology Reports Dg Chest 2 View  02/10/2015  CLINICAL DATA:  Right flank pain with vomiting urinary frequency since this morning. History of melanoma and CHF and hypertension. EXAM: CHEST  2 VIEW COMPARISON:  05/07/2014 FINDINGS: Large hiatal hernia is similar to the prior study. Cardiac silhouette is mildly enlarged. No mediastinal or hilar masses or convincing adenopathy. Clear lungs.  No pleural effusion or pneumothorax. Bony thorax is demineralized but grossly intact. IMPRESSION: No acute  cardiopulmonary disease. Electronically Signed   By: Lajean Manes M.D.   On: 02/10/2015 18:22   Ct Angio Chest Pe W/cm &/or Wo Cm  02/10/2015  CLINICAL DATA:  Nausea and vomiting and upper back pain. EXAM: CT ANGIOGRAPHY CHEST WITH CONTRAST TECHNIQUE: Multidetector CT imaging of the chest was performed using the standard protocol during bolus administration of intravenous contrast. Multiplanar CT image reconstructions and MIPs were obtained to evaluate the vascular anatomy. CONTRAST:  43mL OMNIPAQUE IOHEXOL 350 MG/ML SOLN COMPARISON:  Radiography same day FINDINGS: Pulmonary arterial opacification is excellent. There are no pulmonary emboli. The aorta shows atherosclerosis but there is no aneurysm or dissection. There is extensive coronary artery calcification. There is a very large hiatal hernia apparently filled with ingested material. There is not much within the intra abdominal portion of the gastric antrum. This raises the question/ potential for some degree of obstruction. The esophagus proximal to that is dilated and fluid and air-filled. The lung parenchyma shows emphysema. There is compressive volume loss primarily in the right medial lower lung secondary to the large hiatal hernia. There is no pleural or pericardial fluid. Scans in the upper abdomen do not show any significant lesion. No significant bone finding. Review of the MIP images confirms the above findings. IMPRESSION: Large hiatal hernia filled with ingested material. There could possibly be relative outflow obstruction from this hernia given this appearance. No pulmonary emboli. Electronically Signed   By: Nelson Chimes M.D.   On: 02/10/2015 21:52   Ct Abdomen Pelvis W Contrast  03/03/2015  CLINICAL DATA:  Sudden onset abdominal pain in the left lower quadrant at 17:00. EXAM: CT ABDOMEN AND PELVIS WITH CONTRAST TECHNIQUE: Multidetector CT imaging of the abdomen and pelvis was performed using the standard protocol following bolus  administration of intravenous contrast. CONTRAST:  124mL OMNIPAQUE IOHEXOL 300 MG/ML  SOLN COMPARISON:  02/10/2015 FINDINGS: There is a large volume of extraluminal air, predominantly retroperitoneal. The extraluminal air probably originates from a perforation  of the mid sigmoid colon. There is no bowel obstruction. No abscess or drainable fluid collection is evident. Large hiatal hernia again noted. There are unremarkable appearances of the liver, spleen, pancreas, adrenals and kidneys except for a few benign renal cysts. Uterus and ovaries are unremarkable. Abdominal aorta is normal in caliber with moderate atherosclerotic calcification. No significant abnormalities evident in the lower chest. No significant musculoskeletal lesion is evident. IMPRESSION: Large volume extraluminal air, predominantly retroperitoneal, probably originating from a perforation of the mid sigmoid colon. Critical Value/emergent results were called by telephone at the time of interpretation on 03/03/2015 at 12:45 am to Dr. Tomi Bamberger , who verbally acknowledged these results. Electronically Signed   By: Andreas Newport M.D.   On: 03/03/2015 00:49   Dg Chest Port 1 View  03/05/2015  CLINICAL DATA:  Central line placement. EXAM: PORTABLE CHEST 1 VIEW COMPARISON:  03/04/2015 and 03/02/2015. FINDINGS: 1304 hours. New left subclavian central venous catheter projected laterally at the mid SVC level near the origin of the azygos vein. The heart size and mediastinal contours are stable with a known hiatal hernia. There is no pneumothorax. Medial right basilar airspace disease appears unchanged. The left lung is clear. IMPRESSION: Central venous catheter tip at the mid SVC level near the origin of the azygos vein. No pneumothorax. No significant change in medial right basilar airspace disease. Electronically Signed   By: Richardean Sale M.D.   On: 03/05/2015 14:15   Dg Chest Port 1 View  03/04/2015  CLINICAL DATA:  Hypoxia EXAM: PORTABLE CHEST 1  VIEW COMPARISON:  Two days ago FINDINGS: There is streaky right infrahilar lung opacity which is newly developed. No edema, effusion, or pneumothorax. Lung volumes are low. Chronic cardiomegaly with superimposed large hiatal hernia. IMPRESSION: New right middle lobe atelectasis or consolidation. Given gas-filled hiatal hernia, question underlying aspiration. Electronically Signed   By: Monte Fantasia M.D.   On: 03/04/2015 15:43   Dg Abd Acute W/chest  03/02/2015  CLINICAL DATA:  Left lower quadrant pain. Bloody diarrhea. Shortness of breath. EXAM: DG ABDOMEN ACUTE W/ 1V CHEST COMPARISON:  None. FINDINGS: There is no evidence of dilated bowel loops or free intraperitoneal air. Large amount of stool throughout the colon. No radiopaque calculi or other significant radiographic abnormality is seen. Heart size and mediastinal contours are within normal limits. Both lungs are clear. IMPRESSION: Large amount of stool throughout the colon. No acute cardiopulmonary disease. Electronically Signed   By: Kathreen Devoid   On: 03/02/2015 23:15   Dg Duanne Limerick W/high Density W/kub  02/17/2015  CLINICAL DATA:  Patient status post partial reduction of esophageal large hiatal hernia. Patient has intermittent RIGHT-sided pain and vomiting. EXAM: UPPER GI SERIES WITH KUB TECHNIQUE: After obtaining a scout radiograph a routine upper GI series was performed using high-density FLUOROSCOPY TIME:  Radiation Exposure Index (as provided by the fluoroscopic device): If the device does not provide the exposure index: Fluoroscopy Time (in minutes and seconds):  1 minutes 48 seconds Number of Acquired Images:  15 COMPARISON:  CT 02/10/2015, upper GI 09/27/2014 FINDINGS: Initial KUB demonstrates surgical clips in the gastric cardia region. Administration of contrast demonstrate no stricture or mass within the esophagus. Contrast flowed into the stomach. There is a large hiatal hernia present with approximately 60% of the stomach above the  diaphragm. No retained food stuff within the stomach. The herniated stomach is within the the central and RIGHT side of the mediastinum. The herniated stomach is patulous and retains barium but the  barium eventually flows through the diaphragmatic hiatus into the gastric antrum. Ultimately contrast flows into the C-loop of the duodenum and ligament Treitz in the LEFT upper quadrant. There is some retention of contrast within the stomach at the end of the study. IMPRESSION: 1. Large sliding-type hiatal hernia which is patulous and retains oral contrast. 2. No evidence of high-grade obstruction with contrast flowing into the proximal small bowel. 3. No evidence of esophageal obstruction. 4. No retained food stuff within the stomach at beginning of study. Electronically Signed   By: Suzy Bouchard M.D.   On: 02/17/2015 12:05   Ct Renal Stone Study  02/10/2015  CLINICAL DATA:  Right flank pain since 4 p.m. today. Urinary frequency. Nausea and vomiting. EXAM: CT ABDOMEN AND PELVIS WITHOUT CONTRAST TECHNIQUE: Multidetector CT imaging of the abdomen and pelvis was performed following the standard protocol without IV contrast. COMPARISON:  None. FINDINGS: Lung bases: Large hiatal hernia. There are bowel anastomosis staples along the anterior margin of the herniated stomach. Minor subsegmental atelectasis in the right lower lobe adjacent to the hernia. 3-4 mm nodule in the lateral left lung base, image 7, series 6. Heart normal in size. There are coronary artery calcifications. Liver, spleen, gallbladder, pancreas, adrenal glands:  Unremarkable. There are surgical vascular clips adjacent to the pancreatic tail. Kidneys, ureters, bladder: No ureteral stone or obstructive uropathy. No collecting system dilation. Single 3-4 mm nonobstructing stone in the lower pole the right kidney. 4 mm nonobstructing stone in the lower pole of the left kidney. Bilateral low-density renal masses consistent with cysts. Ureters normal  course and in caliber. Bladder is mostly decompressed but otherwise unremarkable. Uterus and adnexa:  Unremarkable. Lymph nodes:  No adenopathy. Ascites:  None. Gastrointestinal: Cecum extends across midline to the left central abdomen. There are few left colon diverticula. No diverticulitis. Colon otherwise unremarkable. Normal small bowel. Normal appendix visualized. Abdominal wall:  No significant hernia. Vascular: Atherosclerotic calcifications are noted along the aorta and its branch vessels. No aneurysm. Musculoskeletal: Disc and facet degenerative changes in the lower lumbar spine. No osteoblastic or osteolytic lesions. IMPRESSION: 1. No acute findings. 2. No evidence of a ureteral stone or obstructive uropathy. No findings to explain the patient's right flank pain. 3. There are single nonobstructing intrarenal stones in each kidney as well as bilateral renal cysts. 4. Scattered left colon diverticula without diverticulitis. 5. Normal appendix visualized. 6. Large hiatal hernia. Electronically Signed   By: Lajean Manes M.D.   On: 02/10/2015 18:21     CBC  Recent Labs Lab 03/06/15 0040 03/07/15 0420 03/08/15 0333 03/09/15 0455 03/10/15 0813  WBC 10.8* 5.7 11.5* 17.8* 18.8*  HGB 9.5* 8.9* 9.1* 8.9* 8.8*  HCT 28.6* 26.5* 27.2* 27.1* 26.5*  PLT 150 167 173 225 299  MCV 80.8 80.1 79.1 79.2 79.6  MCH 26.8 26.9 26.5 26.0 26.4  MCHC 33.2 33.6 33.5 32.8 33.2  RDW 14.6 15.0 15.0 15.0 14.7    Chemistries   Recent Labs Lab 03/06/15 0040 03/06/15 0801 03/06/15 1625 03/07/15 0420 03/08/15 0333 03/09/15 0455 03/10/15 0813  NA 139 140  --  135 132* 133* 134*  K 2.9* 2.7* 3.8 4.3 4.2 3.7 4.1  CL 112* 111  --  107 104 101 101  CO2 22 21*  --  23 23 24 25   GLUCOSE 123* 123*  --  122* 112* 83 110*  BUN 20 20  --  14 11 13 11   CREATININE 0.91 0.92  --  0.79 0.77 0.77  0.96  CALCIUM 7.0* 7.3*  --  7.6* 7.7* 7.8* 7.9*  MG 2.2 2.2  --  1.9 1.7 1.7  --   AST  --  24  --  22  --   --   --    ALT  --  19  --  16  --   --   --   ALKPHOS  --  65  --  64  --   --   --   BILITOT  --  0.9  --  0.5  --   --   --    ------------------------------------------------------------------------------------------------------------------ estimated creatinine clearance is 46.1 mL/min (by C-G formula based on Cr of 0.96). ------------------------------------------------------------------------------------------------------------------ No results for input(s): HGBA1C in the last 72 hours. ------------------------------------------------------------------------------------------------------------------ No results for input(s): CHOL, HDL, LDLCALC, TRIG, CHOLHDL, LDLDIRECT in the last 72 hours. ------------------------------------------------------------------------------------------------------------------ No results for input(s): TSH, T4TOTAL, T3FREE, THYROIDAB in the last 72 hours.  Invalid input(s): FREET3 ------------------------------------------------------------------------------------------------------------------ No results for input(s): VITAMINB12, FOLATE, FERRITIN, TIBC, IRON, RETICCTPCT in the last 72 hours.  Coagulation profile No results for input(s): INR, PROTIME in the last 168 hours.  No results for input(s): DDIMER in the last 72 hours.  Cardiac Enzymes  Recent Labs Lab 03/04/15 1512 03/04/15 2128 03/05/15 0223  TROPONINI 0.03 0.05* 0.05*   ------------------------------------------------------------------------------------------------------------------ Invalid input(s): POCBNP     Time Spent in minutes   30 minutes   Malaysha Arlen M.D on 03/10/2015 at 9:55 AM  Between 7am to 7pm - Pager - [phone]  After 7pm go to [email or web address] - password [facility]  [facility]   Office  [phone]

## 2015-03-10 NOTE — Clinical Social Work Placement (Signed)
   CLINICAL SOCIAL WORK PLACEMENT  NOTE  Date:  03/10/2015  Patient Details  Name: Porfirio Oar Ferrucci MRN: [id number] Date of Birth: 22-Apr-1934  Clinical Social Work is seeking post-discharge placement for this patient at the [place] level of care (*CSW will initial, date and re-position this form in  chart as items are completed):  Yes   Patient/family provided with [facility] Work Department's list of facilities offering this level of care within the geographic area requested by the patient (or if unable, by the patient's family).  Yes   Patient/family informed of their freedom to choose among providers that offer the needed level of care, that participate in Medicare, Medicaid or managed care program needed by the patient, have an available bed and are willing to accept the patient.  Yes   Patient/family informed of [facility]'s ownership interest in [facility] and [facility], as well as of the fact that they are under no obligation to receive care at these facilities.  PASRR submitted to EDS on 03/09/15     PASRR number received on       Existing PASRR number confirmed on       FL2 transmitted to all facilities in geographic area requested by pt/family on       FL2 transmitted to all facilities within larger geographic area on       Patient informed that his/her managed care company has contracts with or will negotiate with certain facilities, including the following:        Yes   Patient/family informed of bed offers received.  Patient chooses bed at [facility]     Physician recommends and patient chooses bed at      Patient to be transferred to [facility] on  .  Patient to be transferred to facility by       Patient family notified on   of transfer.  Name of family member notified:        PHYSICIAN       Additional Comment:    _______________________________________________ [name], LCSW 03/10/2015, 8:29 AM [phone]

## 2015-03-10 NOTE — Progress Notes (Signed)
PICC line and ok to use per Barnett Applebaum, RN IV team and confirmed by chest xray. Subclavian line removed. Fluids and antibiotics administered through PICC line with out complications.

## 2015-03-11 LAB — CBC
HCT: 23 % — ABNORMAL LOW (ref 36.0–46.0)
Hemoglobin: 7.9 g/dL — ABNORMAL LOW (ref 12.0–15.0)
MCH: 27.1 pg (ref 26.0–34.0)
MCHC: 34.3 g/dL (ref 30.0–36.0)
MCV: 78.8 fL (ref 78.0–100.0)
PLATELETS: 301 10*3/uL (ref 150–400)
RBC: 2.92 MIL/uL — AB (ref 3.87–5.11)
RDW: 14.8 % (ref 11.5–15.5)
WBC: 15.3 10*3/uL — AB (ref 4.0–10.5)

## 2015-03-11 LAB — BASIC METABOLIC PANEL
ANION GAP: 9 (ref 5–15)
BUN: 8 mg/dL (ref 6–20)
CHLORIDE: 101 mmol/L (ref 101–111)
CO2: 22 mmol/L (ref 22–32)
Calcium: 7.3 mg/dL — ABNORMAL LOW (ref 8.9–10.3)
Creatinine, Ser: 0.81 mg/dL (ref 0.44–1.00)
GFR calc Af Amer: >60 mL/min (ref >60)
GFR calc non Af Amer: >60 mL/min (ref >60)
Glucose, Bld: 93 mg/dL (ref 65–99)
POTASSIUM: 3.4 mmol/L — AB (ref 3.5–5.1)
SODIUM: 132 mmol/L — AB (ref 135–145)

## 2015-03-11 LAB — HEPARIN LEVEL (UNFRACTIONATED): Heparin Unfractionated: <0.1 IU/mL — ABNORMAL LOW (ref 0.30–0.70)

## 2015-03-11 LAB — PHOSPHORUS: PHOSPHORUS: 3.1 mg/dL (ref 2.5–4.6)

## 2015-03-11 LAB — PREPARE RBC (CROSSMATCH)

## 2015-03-11 MED ORDER — HEPARIN BOLUS VIA INFUSION
1000.0000 [IU] | Freq: Once | INTRAVENOUS | Status: AC
  Administered 2015-03-11: 1000 [IU] via INTRAVENOUS
  Filled 2015-03-11: qty 1000

## 2015-03-11 MED ORDER — ALUM & MAG HYDROXIDE-SIMETH 200-200-20 MG/5ML PO SUSP
30.0000 mL | Freq: Four times a day (QID) | ORAL | Status: DC | PRN
  Administered 2015-03-11: 30 mL via ORAL
  Filled 2015-03-11: qty 30

## 2015-03-11 MED ORDER — POTASSIUM CHLORIDE CRYS ER 20 MEQ PO TBCR
40.0000 meq | EXTENDED_RELEASE_TABLET | Freq: Once | ORAL | Status: AC
  Administered 2015-03-11: 40 meq via ORAL
  Filled 2015-03-11: qty 2

## 2015-03-11 MED ORDER — SODIUM CHLORIDE 0.9 % IV SOLN
Freq: Once | INTRAVENOUS | Status: AC
  Administered 2015-03-11: 13:00:00 via INTRAVENOUS

## 2015-03-11 MED ORDER — VANCOMYCIN HCL IN DEXTROSE 750-5 MG/150ML-% IV SOLN
750.0000 mg | Freq: Two times a day (BID) | INTRAVENOUS | Status: DC
  Administered 2015-03-11 – 2015-03-13 (×5): 750 mg via INTRAVENOUS
  Filled 2015-03-11 (×7): qty 150

## 2015-03-11 MED ORDER — HEPARIN (PORCINE) IN NACL 100-0.45 UNIT/ML-% IJ SOLN
1400.0000 [IU]/h | INTRAMUSCULAR | Status: DC
  Administered 2015-03-11: 1100 [IU]/h via INTRAVENOUS
  Administered 2015-03-12: 1200 [IU]/h via INTRAVENOUS
  Filled 2015-03-11 (×2): qty 250

## 2015-03-11 NOTE — Progress Notes (Signed)
6 Days Post-Op  Subjective: Patient has no complaints.  Objective: Vital signs in last 24 hours: Temp:  [97.7 F (36.5 C)-98.8 F (37.1 C)] 97.7 F (36.5 C) (12/09 2107) Pulse Rate:  [74-84] 84 (12/09 2107) Resp:  [16] 16 (12/09 1452) BP: (136-151)/(47-102) 151/47 mmHg (12/09 2107) SpO2:  [94 %-98 %] 98 % (12/09 2107) Weight:  [72.576 kg (160 lb)] 72.576 kg (160 lb) (12/10 0442) Last BM Date:  (colostomy)  Intake/Output from previous day: 12/09 0701 - 12/10 0700 In: 1835 [P.O.:840; I.V.:865; IV Piggyback:100] Out: 1105 [Drains:5; [id number] Intake/Output this shift:    General appearance: alert, cooperative and no distress GI: Soft, incision healing well. Ostomy pink and patent. JP output cloudy.  Lab Results:   Recent Labs  03/10/15 0813 03/11/15 0709  WBC 18.8* 15.3*  HGB 8.8* 7.9*  HCT 26.5* 23.0*  PLT 299 301   BMET  Recent Labs  03/10/15 0813 03/11/15 0709  NA 134* 132*  K 4.1 3.4*  CL 101 101  CO2 25 22  GLUCOSE 110* 93  BUN 11 8  CREATININE 0.96 0.81  CALCIUM 7.9* 7.3*   PT/INR No results for input(s): LABPROT, INR in the last 72 hours.  Studies/Results: Ct Abdomen Pelvis W Contrast  03/10/2015  CLINICAL DATA:  80 year old who underwent sigmoid colectomy and descending colostomy related to colonic perforation on 03/05/2015, now with worsening leukocytosis. EXAM: CT ABDOMEN AND PELVIS WITH CONTRAST TECHNIQUE: Multidetector CT imaging of the abdomen and pelvis was performed using the standard protocol following bolus administration of intravenous contrast. CONTRAST:  144mL OMNIPAQUE IOHEXOL 300 MG/ML IV. COMPARISON:  Preoperative CT abdomen and pelvis 03/03/2015, 02/10/2015. FINDINGS: Lower chest: Small to moderate-sized right pleural effusion and associated minimal passive atelectasis in the right lower lobe. Small left pleural effusion. Chronic elevation of the left hemidiaphragm with chronic scar/atelectasis in the left lower lobe. Heart moderately  enlarged, particularly the left atrium. Hepatobiliary: Liver normal in size and appearance. Gallbladder unremarkable by CT. No biliary ductal dilation. Pancreas: Severe diffuse atrophy. No focal pancreatic parenchymal abnormality. Spleen: Normal in size and appearance. Surgical clips again noted adjacent to the medial spleen Adrenals/Urinary Tract: Normal appearing adrenal glands. Mildly atrophic left kidney with diffuse cortical thinning. Nonobstructing approximate 5 mm calculus in a lower pole calix of the left kidney. No right urinary tract calculi. Cortical cysts involving both kidneys, the largest arising from the lower pole of the left kidney approximating 3.5 cm. No significant focal abnormality involving either kidney. Urinary bladder contains gas, presumably related to recent instrumentation, and is otherwise normal in appearance. Stomach/Bowel: Surgical suture material involving the stomach related to prior Nissen fundoplication. Recurrent very large hiatal hernia with at least 2/3 of the stomach present in the chest, unchanged from the prior examinations. Distended loops of jejunum in the left upper quadrant of the abdomen, with normal caliber loops of distal jejunum and ileum. Cecum decompressed which I believe accounts for apparent wall thickening, positioned anteriorly in the upper pelvis. Approximate 2.6 cm mass in the ascending colon, obscured by stool on the preoperative CT. Satisfactory appearance of the descending colostomy. Vascular/Lymphatic: Severe atherosclerosis involving the abdominal aorta and moderate iliofemoral atherosclerosis without aneurysm. No pathologic lymphadenopathy. Reproductive: Atrophic uterus consistent with age. No adnexal masses. Other: Adjacent fluid collections with enhancing wall, containing gas, in the right side of the mid and lower pelvis. The fluid collection more superiorly has adjacent phlegmonous change and residual extraluminal gas. In total, the phlegmon and  fluid collection measure approximately 4.2  x 6.6 x 4.5 cm. The fluid collection itself which contains a large amount of gas approximates 3.3 cm maximally. The fluid collection more inferiorly in the pelvis is oval-shaped and extends into the low pelvis just above the urinary bladder. The collection is difficult accurately measure, though extends over an approximate 9-10 cm superoinferior length. Phlegmonous change with associated extraluminal gas is present in the left side of the low pelvis extending into the presacral space, extending over an approximate 11 cm length. A surgical drain is in place in the low pelvis. Musculoskeletal: Degenerative disc disease at L4-5 and L5-S1. Generalized osseous demineralization. Lower thoracic spondylosis. IMPRESSION: 1. At least 3 abscesses in the left side of the pelvis as described above. Two of these collections have predominantly phlegmonous change with extraluminal gas. The largest fluid collection spans approximately 11 cm in length and would be amenable to percutaneous drainage. The other 2 predominantly phlegmonous collections would likely not be well treated by percutaneous drainage at this point. 2. Approximate 2.6 cm mass involving the ascending colon, best seen on the coronal reformatted images, worrisome for malignancy. This lesion was obscured by stool on the preoperative CT. 3. Dilated loops of jejunum in the left upper quadrant, more likely to represent postoperative ileus than obstruction, though followup abdominal x-rays may be helpful. 4. Bilateral pleural effusions, right greater than left, with associated mild passive atelectasis in the right lower lobe. 5. Gas within the urinary bladder, presumably related to recent instrumentation. If the patient has not had a Foley catheter placed recently, an enteric-vesical fistula is possible. 6. Numerous chronic findings including large hiatal hernia, severe pancreatic atrophy, mildly atrophic left kidney with a  nonobstructing 5 mm lower pole calculus. Electronically Signed   By: Evangeline Dakin M.D.   On: 03/10/2015 21:08   Dg Chest Port 1 View  03/10/2015  CLINICAL DATA:  PICC line placement EXAM: PORTABLE CHEST 1 VIEW COMPARISON:  Radiograph 03/05/2015 FINDINGS: Interval placement of a RIGHT PICC line which tip deflects into the brachiocephalic vein. LEFT central venous line with tip in the proximal brachiocephalic vein. No pneumothorax. Improvement in RIGHT lower lobe atelectasis. IMPRESSION: 1. RIGHT PICC line with tip deflected into the distal aspect the brachiocephalic vein. 2. Improvement in RIGHT lower lobe atelectasis. Electronically Signed   By: Suzy Bouchard M.D.   On: 03/10/2015 14:27   Dg Chest Port 1v Same Day  03/10/2015  CLINICAL DATA:  PICC line placement. Congestive heart failure and coronary artery disease. EXAM: PORTABLE CHEST 1 VIEW COMPARISON:  03/10/2015 FINDINGS: The right arm PICC line has been repositioned, with tip now all in the mid to distal SVC. Left subclavian central venous catheter tip overlies the left innominate vein is unchanged in position. No evidence of pneumothorax or pleural effusion. Heart size is stable. Both lungs are clear. Moderate to large hiatal hernia again noted. IMPRESSION: Right arm PICC line now in appropriate position. No active lung disease.  Hiatal hernia. Electronically Signed   By: Earle Gell M.D.   On: 03/10/2015 17:22   Dg Chest Port 1v Same Day  03/10/2015  CLINICAL DATA:  Re- ingested PICC EXAM: PORTABLE CHEST 1 VIEW COMPARISON:  Earlier today FINDINGS: Unchanged appearance of the right upper extremity PICC with tip in the distal left brachiocephalic vein. Unchanged left-sided PICC, tip at the left brachiocephalic vein. Chronic cardiomegaly. Right lower mediastinal contour abnormality correlating with large hiatal hernia. There is no edema, consolidation, effusion, or pneumothorax. IMPRESSION: Unchanged positioning of bilateral upper extremity  PICCs, with both tips at the left brachiocephalic vein. Electronically Signed   By: Monte Fantasia M.D.   On: 03/10/2015 15:14    Anti-infectives: Anti-infectives    Start     Dose/Rate Route Frequency Ordered Stop   03/10/15 1015  vancomycin (VANCOCIN) 1,500 mg in sodium chloride 0.9 % 500 mL IVPB     1,500 mg 250 mL/hr over 120 Minutes Intravenous  Once 03/10/15 1013 03/10/15 1252   03/09/15 0930  metroNIDAZOLE (FLAGYL) IVPB 500 mg     500 mg 100 mL/hr over 60 Minutes Intravenous Every 8 hours 03/09/15 0919     03/03/15 0900  piperacillin-tazobactam (ZOSYN) IVPB 3.375 g     3.375 g 12.5 mL/hr over 240 Minutes Intravenous Every 8 hours 03/03/15 0739     03/03/15 0100  piperacillin-tazobactam (ZOSYN) IVPB 3.375 g     3.375 g 100 mL/hr over 30 Minutes Intravenous  Once 03/03/15 0049 03/03/15 0121      Assessment/Plan: s/p Procedure(s): PARTIAL COLECTOMY WITH COLOSTOMY CREATION/HARTMANN PROCEDURE CENTRAL LINE INSERTION Impression: Leukocytosis slowly improving. Initial CT scan suggested possible multiple abscesses. In review with interventional radiology, they would not recommend CT-guided drainage at this point as this appeared to be mostly air. As patient is improved, will cancel procedure. Patient does have anemia, but does not require blood transfusion at this time. Will restart heparin. Recheck CBC in a.m. Discussed with patient's daughter.  LOS: 8 days    [name] A 03/11/2015

## 2015-03-11 NOTE — Progress Notes (Addendum)
Patient Demographics  [name], is a 80 y.o. female, DOB - 24-Jan-1935, [id number]  Admit date - 03/02/2015   Admitting Physician Phillips Grout, MD  Outpatient Primary MD for the patient is [name]  LOS - 8   Chief Complaint  Patient presents with  . Abdominal Pain       Admission HPI/Brief narrative: 7 yow presented with complaints of LLQ abdominal pain with associated nausea. CT scan reveal perforated bowel. General surgery preformed partial colectomy 120/04 with no immediate complications. Her overall hospital course has been complicated by development of Afib. She has been started on rate control and since converted back to SR. Her CHADSVASc score is 6, cardiology consulted, and recommended to continue patient on anticoagulation. Repeat CT abdomen with questionable abscess, Dr. Arnoldo Morale discussed with interventional radiology, these findings more consistent with postoperative changes.  Subjective:   [name] today has, No headache, No chest pain, No new weakness tingling or numbness, No Cough - SOB, reports abdominal pain improving , tolerating soft diet.  Assessment & Plan    Principal Problem:   Perforated sigmoid colon ([place]) Active Problems:   HYPERCHOLESTEROLEMIA   Anxiety state   DIASTOLIC DYSFUNCTION   GERD   HTN (hypertension)   Coronary artery disease   Abdominal pain   Lactic acidosis   Prolonged Q-T interval on ECG   CKD (chronic kidney disease) stage 3, GFR 30-59 ml/min   AKI (acute kidney injury) ([place])   Atrial flutter ([place])   perforated sigmoid colon  - As evident on CT abdomen pelvis on 12/2 . - Surgical consult greatly appreciated, status post partial colectomy with colostomy/Hartmann procedure  on 03/05/2015, pathology significant for DIVERTICULITIS WITH ABSCESS, PERFORATION AND ASSOCIATED ACUTE SEROSITIS. - Management per surgery, pain is well  controlled, tolerating soft diet. - Repeat CT abdomen pelvis 12/9 done secondary to worsening leukocytosis, initial findings suggestive of abscess, Dr. Arnoldo Morale discussed with interventional radiology, most of these findings are postoperative, or indeterminant, will hold on Protonix drain, will repeat imaging in 1 week for further evaluation.  . -  JP drain still putting out dark cloudy material ,so will continue with IV Zosyn, IV Flagyl added on 12/8 by surgery,  Leukocytosis - Please see above discussion. -  as well  discontinue patient left subclavian TLC 12/9 ,  started empirically on IV vancomycin pending blood cultures.  Finding of mass in ascending colon - This will need to be further worked up and patient is more stable from surgical point of view.   paroxysmal A. fib with RVR - Postoperative, currently converted to normal sinus rhythm, off Cardizem drip, started on metoprolol.  - CHADSVASC=6  Started on eliquis, back on heparin GTT, pending further workup.  Acute on chronic kidney  stage III  - Likely related to IV contrast, resolved with IV fluids   Anemia - hemoglobin of 7.9 today, transfuse 1 unit PRBC today.  Hypokalemia/hypophosphatemia  - Replete as needed  GERD  - Continue with PPI   Coronary artery disease  - Continue with aspirin and statin  Hypertension  - Uncontrolled, continue with metoprolol and losartan, will add amlodipine.   hyperlipidemia - Continue with statin  Code Status: full  Family Communication: discussed with  patient  Disposition Plan: SNF when stable   Procedures  - partial colectomy with colostomy/Hartmann procedure  on 03/05/2015 - PICC line 12/9 - Discontinued left subclavian TLC 12/9   Consults   Gen. surgery   cardiology    Medications  Scheduled Meds: . sodium chloride   Intravenous Once  . amLODipine  5 mg Oral Daily  . feeding supplement (ENSURE ENLIVE)  237 mL Oral BID BM  . guaiFENesin  1,200 mg Oral BID  .  losartan  100 mg Oral Daily  . metoprolol tartrate  25 mg Oral BID  . metronidazole  500 mg Intravenous Q8H  . pantoprazole  40 mg Oral Q1200  . piperacillin-tazobactam (ZOSYN)  IV  3.375 g Intravenous Q8H  . potassium & sodium phosphates  1 packet Oral TID WC & HS  . sodium chloride  10-40 mL Intracatheter Q12H  . sodium chloride  3 mL Intravenous Q12H  . vancomycin  750 mg Intravenous Q12H   Continuous Infusions: . sodium chloride 75 mL/hr at 03/10/15 2102  . heparin 950 Units/hr (03/11/15 0900)   PRN Meds:.acetaminophen, ALPRAZolam, hydrALAZINE, HYDROmorphone (DILAUDID) injection, magnesium hydroxide, ondansetron **OR** ondansetron (ZOFRAN) IV, simethicone, sodium chloride  DVT Prophylaxis  Heparin GTT  Lab Results  Component Value Date   PLT 301 03/11/2015    Antibiotics    Anti-infectives    Start     Dose/Rate Route Frequency Ordered Stop   03/11/15 0900  vancomycin (VANCOCIN) IVPB 750 mg/150 ml premix     750 mg 150 mL/hr over 60 Minutes Intravenous Every 12 hours 03/11/15 0850     03/10/15 1015  vancomycin (VANCOCIN) 1,500 mg in sodium chloride 0.9 % 500 mL IVPB     1,500 mg 250 mL/hr over 120 Minutes Intravenous  Once 03/10/15 1013 03/10/15 1252   03/09/15 0930  metroNIDAZOLE (FLAGYL) IVPB 500 mg     500 mg 100 mL/hr over 60 Minutes Intravenous Every 8 hours 03/09/15 0919     03/03/15 0900  piperacillin-tazobactam (ZOSYN) IVPB 3.375 g     3.375 g 12.5 mL/hr over 240 Minutes Intravenous Every 8 hours 03/03/15 0739     03/03/15 0100  piperacillin-tazobactam (ZOSYN) IVPB 3.375 g     3.375 g 100 mL/hr over 30 Minutes Intravenous  Once 03/03/15 0049 03/03/15 0121          Objective:   Filed Vitals:   03/10/15 0600 03/10/15 1452 03/10/15 2107 03/11/15 0442  BP:  136/102 151/47   Pulse:  74 84   Temp:  98.8 F (37.1 C) 97.7 F (36.5 C)   TempSrc:  Oral Oral   Resp:  16    Height:      Weight: 72.712 kg (160 lb 4.8 oz)   72.576 kg (160 lb)  SpO2:  94% 98%      Wt Readings from Last 3 Encounters:  03/11/15 72.576 kg (160 lb)  02/14/15 67.042 kg (147 lb 12.8 oz)  02/10/15 67.132 kg (148 lb)     Intake/Output Summary (Last 24 hours) at 03/11/15 1109 Last data filed at 03/11/15 0903  Gross per 24 hour  Intake   1715 ml  Output   1605 ml  Net    110 ml     Physical Exam  General: NAD. Appears calm and looks comfortable lying in bed.   Cardiovascular: Irregular   Respiratory: clear bilaterally, No wheezing, rales or rhonchi  Abdomen: soft, no distention, Colostomy noted, midline surgical scar with no bleed or discharge,  JP drain right lower abdomen  Musculoskeletal: No edema b/l  Psychiatric: Oriented to self, place, and reason for being in the hospital.    Data Review   Micro Results Recent Results (from the past 240 hour(s))  Culture, blood (routine x 2)     Status: None   Collection Time: 03/02/15 11:10 PM  Result Value Ref Range Status   Specimen Description BLOOD RIGHT HAND  Final   Special Requests BOTTLES DRAWN AEROBIC AND ANAEROBIC 4CC EACH  Final   Culture NO GROWTH 5 DAYS  Final   Report Status 03/07/2015 FINAL  Final  Culture, blood (routine x 2)     Status: None   Collection Time: 03/02/15 11:23 PM  Result Value Ref Range Status   Specimen Description BLOOD LEFT FOREARM  Final   Special Requests   Final    BOTTLES DRAWN AEROBIC AND ANAEROBIC AEB=4CC ANA=2CC   Culture NO GROWTH 5 DAYS  Final   Report Status 03/07/2015 FINAL  Final  Surgical pcr screen     Status: None   Collection Time: 03/03/15  2:45 AM  Result Value Ref Range Status   MRSA, PCR NEGATIVE NEGATIVE Final   Staphylococcus aureus NEGATIVE NEGATIVE Final    Comment:        The Xpert SA Assay (FDA approved for NASAL specimens in patients over 47 years of age), is one component of a comprehensive surveillance program.  Test performance has been validated by [facility] for patients greater than or equal to 52 year old. It is not  intended to diagnose infection nor to guide or monitor treatment.   MRSA PCR Screening     Status: None   Collection Time: 03/04/15  1:43 PM  Result Value Ref Range Status   MRSA by PCR NEGATIVE NEGATIVE Final    Comment:        The GeneXpert MRSA Assay (FDA approved for NASAL specimens only), is one component of a comprehensive MRSA colonization surveillance program. It is not intended to diagnose MRSA infection nor to guide or monitor treatment for MRSA infections.   Culture, blood (routine x 2)     Status: None (Preliminary result)   Collection Time: 03/10/15  9:12 AM  Result Value Ref Range Status   Specimen Description RIGHT ANTECUBITAL  Final   Special Requests BOTTLES DRAWN AEROBIC AND ANAEROBIC 12CC EACH  Final   Culture NO GROWTH < 24 HOURS  Final   Report Status PENDING  Incomplete  Culture, blood (routine x 2)     Status: None (Preliminary result)   Collection Time: 03/10/15  9:18 AM  Result Value Ref Range Status   Specimen Description ARM  Final   Special Requests BOTTLES DRAWN AEROBIC AND ANAEROBIC PICC 6 CC EACH  Final   Culture NO GROWTH < 24 HOURS  Final   Report Status PENDING  Incomplete    Radiology Reports Dg Chest 2 View  02/10/2015  CLINICAL DATA:  Right flank pain with vomiting urinary frequency since this morning. History of melanoma and CHF and hypertension. EXAM: CHEST  2 VIEW COMPARISON:  05/07/2014 FINDINGS: Large hiatal hernia is similar to the prior study. Cardiac silhouette is mildly enlarged. No mediastinal or hilar masses or convincing adenopathy. Clear lungs.  No pleural effusion or pneumothorax. Bony thorax is demineralized but grossly intact. IMPRESSION: No acute cardiopulmonary disease. Electronically Signed   By: Lajean Manes M.D.   On: 02/10/2015 18:22   Ct Angio Chest Pe W/cm &/or Wo Cm  02/10/2015  CLINICAL DATA:  Nausea and vomiting and upper back pain. EXAM: CT ANGIOGRAPHY CHEST WITH CONTRAST TECHNIQUE: Multidetector CT imaging of  the chest was performed using the standard protocol during bolus administration of intravenous contrast. Multiplanar CT image reconstructions and MIPs were obtained to evaluate the vascular anatomy. CONTRAST:  31mL OMNIPAQUE IOHEXOL 350 MG/ML SOLN COMPARISON:  Radiography same day FINDINGS: Pulmonary arterial opacification is excellent. There are no pulmonary emboli. The aorta shows atherosclerosis but there is no aneurysm or dissection. There is extensive coronary artery calcification. There is a very large hiatal hernia apparently filled with ingested material. There is not much within the intra abdominal portion of the gastric antrum. This raises the question/ potential for some degree of obstruction. The esophagus proximal to that is dilated and fluid and air-filled. The lung parenchyma shows emphysema. There is compressive volume loss primarily in the right medial lower lung secondary to the large hiatal hernia. There is no pleural or pericardial fluid. Scans in the upper abdomen do not show any significant lesion. No significant bone finding. Review of the MIP images confirms the above findings. IMPRESSION: Large hiatal hernia filled with ingested material. There could possibly be relative outflow obstruction from this hernia given this appearance. No pulmonary emboli. Electronically Signed   By: Nelson Chimes M.D.   On: 02/10/2015 21:52   Ct Abdomen Pelvis W Contrast  03/11/2015  ADDENDUM REPORT: 03/11/2015 08:44 ADDENDUM: Findings and [place] reviewed with Dr. Arnoldo Morale. Left hemipelvis and posterior sacral retroperitoneal collection is mostly air. No significant fluid component for drain catheter. Second Left lower quadrant small fluid collection with a single focus of air, image 64 extends slightly midline over the bladder. This remains indeterminate. White count has decreased from from 18 to 15. Patient is only 6 days postop. Will hold on percutaneous drainage and consider repeat CT next week.  Electronically Signed   By: Jerilynn Mages.  Shick M.D.   On: 03/11/2015 08:44  03/11/2015  CLINICAL DATA:  80 year old who underwent sigmoid colectomy and descending colostomy related to colonic perforation on 03/05/2015, now with worsening leukocytosis. EXAM: CT ABDOMEN AND PELVIS WITH CONTRAST TECHNIQUE: Multidetector CT imaging of the abdomen and pelvis was performed using the standard protocol following bolus administration of intravenous contrast. CONTRAST:  174mL OMNIPAQUE IOHEXOL 300 MG/ML IV. COMPARISON:  Preoperative CT abdomen and pelvis 03/03/2015, 02/10/2015. FINDINGS: Lower chest: Small to moderate-sized right pleural effusion and associated minimal passive atelectasis in the right lower lobe. Small left pleural effusion. Chronic elevation of the left hemidiaphragm with chronic scar/atelectasis in the left lower lobe. Heart moderately enlarged, particularly the left atrium. Hepatobiliary: Liver normal in size and appearance. Gallbladder unremarkable by CT. No biliary ductal dilation. Pancreas: Severe diffuse atrophy. No focal pancreatic parenchymal abnormality. Spleen: Normal in size and appearance. Surgical clips again noted adjacent to the medial spleen Adrenals/Urinary Tract: Normal appearing adrenal glands. Mildly atrophic left kidney with diffuse cortical thinning. Nonobstructing approximate 5 mm calculus in a lower pole calix of the left kidney. No right urinary tract calculi. Cortical cysts involving both kidneys, the largest arising from the lower pole of the left kidney approximating 3.5 cm. No significant focal abnormality involving either kidney. Urinary bladder contains gas, presumably related to recent instrumentation, and is otherwise normal in appearance. Stomach/Bowel: Surgical suture material involving the stomach related to prior Nissen fundoplication. Recurrent very large hiatal hernia with at least 2/3 of the stomach present in the chest, unchanged from the prior examinations. Distended loops  of jejunum in the  left upper quadrant of the abdomen, with normal caliber loops of distal jejunum and ileum. Cecum decompressed which I believe accounts for apparent wall thickening, positioned anteriorly in the upper pelvis. Approximate 2.6 cm mass in the ascending colon, obscured by stool on the preoperative CT. Satisfactory appearance of the descending colostomy. Vascular/Lymphatic: Severe atherosclerosis involving the abdominal aorta and moderate iliofemoral atherosclerosis without aneurysm. No pathologic lymphadenopathy. Reproductive: Atrophic uterus consistent with age. No adnexal masses. Other: Adjacent fluid collections with enhancing wall, containing gas, in the right side of the mid and lower pelvis. The fluid collection more superiorly has adjacent phlegmonous change and residual extraluminal gas. In total, the phlegmon and fluid collection measure approximately 4.2 x 6.6 x 4.5 cm. The fluid collection itself which contains a large amount of gas approximates 3.3 cm maximally. The fluid collection more inferiorly in the pelvis is oval-shaped and extends into the low pelvis just above the urinary bladder. The collection is difficult accurately measure, though extends over an approximate 9-10 cm superoinferior length. Phlegmonous change with associated extraluminal gas is present in the left side of the low pelvis extending into the presacral space, extending over an approximate 11 cm length. A surgical drain is in place in the low pelvis. Musculoskeletal: Degenerative disc disease at L4-5 and L5-S1. Generalized osseous demineralization. Lower thoracic spondylosis. IMPRESSION: 1. At least 3 abscesses in the left side of the pelvis as described above. Two of these collections have predominantly phlegmonous change with extraluminal gas. The largest fluid collection spans approximately 11 cm in length and would be amenable to percutaneous drainage. The other 2 predominantly phlegmonous collections would likely  not be well treated by percutaneous drainage at this point. 2. Approximate 2.6 cm mass involving the ascending colon, best seen on the coronal reformatted images, worrisome for malignancy. This lesion was obscured by stool on the preoperative CT. 3. Dilated loops of jejunum in the left upper quadrant, more likely to represent postoperative ileus than obstruction, though followup abdominal x-rays may be helpful. 4. Bilateral pleural effusions, right greater than left, with associated mild passive atelectasis in the right lower lobe. 5. Gas within the urinary bladder, presumably related to recent instrumentation. If the patient has not had a Foley catheter placed recently, an enteric-vesical fistula is possible. 6. Numerous chronic findings including large hiatal hernia, severe pancreatic atrophy, mildly atrophic left kidney with a nonobstructing 5 mm lower pole calculus. Electronically Signed: By: Evangeline Dakin M.D. On: 03/10/2015 21:08   Ct Abdomen Pelvis W Contrast  03/03/2015  CLINICAL DATA:  Sudden onset abdominal pain in the left lower quadrant at 17:00. EXAM: CT ABDOMEN AND PELVIS WITH CONTRAST TECHNIQUE: Multidetector CT imaging of the abdomen and pelvis was performed using the standard protocol following bolus administration of intravenous contrast. CONTRAST:  147mL OMNIPAQUE IOHEXOL 300 MG/ML  SOLN COMPARISON:  02/10/2015 FINDINGS: There is a large volume of extraluminal air, predominantly retroperitoneal. The extraluminal air probably originates from a perforation of the mid sigmoid colon. There is no bowel obstruction. No abscess or drainable fluid collection is evident. Large hiatal hernia again noted. There are unremarkable appearances of the liver, spleen, pancreas, adrenals and kidneys except for a few benign renal cysts. Uterus and ovaries are unremarkable. Abdominal aorta is normal in caliber with moderate atherosclerotic calcification. No significant abnormalities evident in the lower chest.  No significant musculoskeletal lesion is evident. IMPRESSION: Large volume extraluminal air, predominantly retroperitoneal, probably originating from a perforation of the mid sigmoid colon. Critical Value/emergent results were called by  telephone at the time of interpretation on 03/03/2015 at 12:45 am to Dr. Tomi Bamberger , who verbally acknowledged these results. Electronically Signed   By: Andreas Newport M.D.   On: 03/03/2015 00:49   Dg Chest Port 1 View  03/10/2015  CLINICAL DATA:  PICC line placement EXAM: PORTABLE CHEST 1 VIEW COMPARISON:  Radiograph 03/05/2015 FINDINGS: Interval placement of a RIGHT PICC line which tip deflects into the brachiocephalic vein. LEFT central venous line with tip in the proximal brachiocephalic vein. No pneumothorax. Improvement in RIGHT lower lobe atelectasis. IMPRESSION: 1. RIGHT PICC line with tip deflected into the distal aspect the brachiocephalic vein. 2. Improvement in RIGHT lower lobe atelectasis. Electronically Signed   By: Suzy Bouchard M.D.   On: 03/10/2015 14:27   Dg Chest Port 1 View  03/05/2015  CLINICAL DATA:  Central line placement. EXAM: PORTABLE CHEST 1 VIEW COMPARISON:  03/04/2015 and 03/02/2015. FINDINGS: 1304 hours. New left subclavian central venous catheter projected laterally at the mid SVC level near the origin of the azygos vein. The heart size and mediastinal contours are stable with a known hiatal hernia. There is no pneumothorax. Medial right basilar airspace disease appears unchanged. The left lung is clear. IMPRESSION: Central venous catheter tip at the mid SVC level near the origin of the azygos vein. No pneumothorax. No significant change in medial right basilar airspace disease. Electronically Signed   By: Richardean Sale M.D.   On: 03/05/2015 14:15   Dg Chest Port 1 View  03/04/2015  CLINICAL DATA:  Hypoxia EXAM: PORTABLE CHEST 1 VIEW COMPARISON:  Two days ago FINDINGS: There is streaky right infrahilar lung opacity which is newly developed.  No edema, effusion, or pneumothorax. Lung volumes are low. Chronic cardiomegaly with superimposed large hiatal hernia. IMPRESSION: New right middle lobe atelectasis or consolidation. Given gas-filled hiatal hernia, question underlying aspiration. Electronically Signed   By: Monte Fantasia M.D.   On: 03/04/2015 15:43   Dg Chest Port 1v Same Day  03/10/2015  CLINICAL DATA:  PICC line placement. Congestive heart failure and coronary artery disease. EXAM: PORTABLE CHEST 1 VIEW COMPARISON:  03/10/2015 FINDINGS: The right arm PICC line has been repositioned, with tip now all in the mid to distal SVC. Left subclavian central venous catheter tip overlies the left innominate vein is unchanged in position. No evidence of pneumothorax or pleural effusion. Heart size is stable. Both lungs are clear. Moderate to large hiatal hernia again noted. IMPRESSION: Right arm PICC line now in appropriate position. No active lung disease.  Hiatal hernia. Electronically Signed   By: Earle Gell M.D.   On: 03/10/2015 17:22   Dg Chest Port 1v Same Day  03/10/2015  CLINICAL DATA:  Re- ingested PICC EXAM: PORTABLE CHEST 1 VIEW COMPARISON:  Earlier today FINDINGS: Unchanged appearance of the right upper extremity PICC with tip in the distal left brachiocephalic vein. Unchanged left-sided PICC, tip at the left brachiocephalic vein. Chronic cardiomegaly. Right lower mediastinal contour abnormality correlating with large hiatal hernia. There is no edema, consolidation, effusion, or pneumothorax. IMPRESSION: Unchanged positioning of bilateral upper extremity PICCs, with both tips at the left brachiocephalic vein. Electronically Signed   By: Monte Fantasia M.D.   On: 03/10/2015 15:14   Dg Abd Acute W/chest  03/02/2015  CLINICAL DATA:  Left lower quadrant pain. Bloody diarrhea. Shortness of breath. EXAM: DG ABDOMEN ACUTE W/ 1V CHEST COMPARISON:  None. FINDINGS: There is no evidence of dilated bowel loops or free intraperitoneal air. Large  amount of stool  throughout the colon. No radiopaque calculi or other significant radiographic abnormality is seen. Heart size and mediastinal contours are within normal limits. Both lungs are clear. IMPRESSION: Large amount of stool throughout the colon. No acute cardiopulmonary disease. Electronically Signed   By: Kathreen Devoid   On: 03/02/2015 23:15   Dg Duanne Limerick W/high Density W/kub  02/17/2015  CLINICAL DATA:  Patient status post partial reduction of esophageal large hiatal hernia. Patient has intermittent RIGHT-sided pain and vomiting. EXAM: UPPER GI SERIES WITH KUB TECHNIQUE: After obtaining a scout radiograph a routine upper GI series was performed using high-density FLUOROSCOPY TIME:  Radiation Exposure Index (as provided by the fluoroscopic device): If the device does not provide the exposure index: Fluoroscopy Time (in minutes and seconds):  1 minutes 48 seconds Number of Acquired Images:  15 COMPARISON:  CT 02/10/2015, upper GI 09/27/2014 FINDINGS: Initial KUB demonstrates surgical clips in the gastric cardia region. Administration of contrast demonstrate no stricture or mass within the esophagus. Contrast flowed into the stomach. There is a large hiatal hernia present with approximately 60% of the stomach above the diaphragm. No retained food stuff within the stomach. The herniated stomach is within the the central and RIGHT side of the mediastinum. The herniated stomach is patulous and retains barium but the barium eventually flows through the diaphragmatic hiatus into the gastric antrum. Ultimately contrast flows into the C-loop of the duodenum and ligament Treitz in the LEFT upper quadrant. There is some retention of contrast within the stomach at the end of the study. IMPRESSION: 1. Large sliding-type hiatal hernia which is patulous and retains oral contrast. 2. No evidence of high-grade obstruction with contrast flowing into the proximal small bowel. 3. No evidence of esophageal obstruction. 4. No  retained food stuff within the stomach at beginning of study. Electronically Signed   By: Suzy Bouchard M.D.   On: 02/17/2015 12:05   Ct Renal Stone Study  02/10/2015  CLINICAL DATA:  Right flank pain since 4 p.m. today. Urinary frequency. Nausea and vomiting. EXAM: CT ABDOMEN AND PELVIS WITHOUT CONTRAST TECHNIQUE: Multidetector CT imaging of the abdomen and pelvis was performed following the standard protocol without IV contrast. COMPARISON:  None. FINDINGS: Lung bases: Large hiatal hernia. There are bowel anastomosis staples along the anterior margin of the herniated stomach. Minor subsegmental atelectasis in the right lower lobe adjacent to the hernia. 3-4 mm nodule in the lateral left lung base, image 7, series 6. Heart normal in size. There are coronary artery calcifications. Liver, spleen, gallbladder, pancreas, adrenal glands:  Unremarkable. There are surgical vascular clips adjacent to the pancreatic tail. Kidneys, ureters, bladder: No ureteral stone or obstructive uropathy. No collecting system dilation. Single 3-4 mm nonobstructing stone in the lower pole the right kidney. 4 mm nonobstructing stone in the lower pole of the left kidney. Bilateral low-density renal masses consistent with cysts. Ureters normal course and in caliber. Bladder is mostly decompressed but otherwise unremarkable. Uterus and adnexa:  Unremarkable. Lymph nodes:  No adenopathy. Ascites:  None. Gastrointestinal: Cecum extends across midline to the left central abdomen. There are few left colon diverticula. No diverticulitis. Colon otherwise unremarkable. Normal small bowel. Normal appendix visualized. Abdominal wall:  No significant hernia. Vascular: Atherosclerotic calcifications are noted along the aorta and its branch vessels. No aneurysm. Musculoskeletal: Disc and facet degenerative changes in the lower lumbar spine. No osteoblastic or osteolytic lesions. IMPRESSION: 1. No acute findings. 2. No evidence of a ureteral stone  or obstructive uropathy. No findings to explain  the patient's right flank pain. 3. There are single nonobstructing intrarenal stones in each kidney as well as bilateral renal cysts. 4. Scattered left colon diverticula without diverticulitis. 5. Normal appendix visualized. 6. Large hiatal hernia. Electronically Signed   By: Lajean Manes M.D.   On: 02/10/2015 18:21     CBC  Recent Labs Lab 03/07/15 0420 03/08/15 0333 03/09/15 0455 03/10/15 0813 03/11/15 0709  WBC 5.7 11.5* 17.8* 18.8* 15.3*  HGB 8.9* 9.1* 8.9* 8.8* 7.9*  HCT 26.5* 27.2* 27.1* 26.5* 23.0*  PLT 167 173 225 299 301  MCV 80.1 79.1 79.2 79.6 78.8  MCH 26.9 26.5 26.0 26.4 27.1  MCHC 33.6 33.5 32.8 33.2 34.3  RDW 15.0 15.0 15.0 14.7 14.8    Chemistries   Recent Labs Lab 03/06/15 0040 03/06/15 0801  03/07/15 0420 03/08/15 0333 03/09/15 0455 03/10/15 0813 03/11/15 0709  NA 139 140  --  135 132* 133* 134* 132*  K 2.9* 2.7*  < > 4.3 4.2 3.7 4.1 3.4*  CL 112* 111  --  107 104 101 101 101  CO2 22 21*  --  23 23 24 25 22   GLUCOSE 123* 123*  --  122* 112* 83 110* 93  BUN 20 20  --  14 11 13 11 8   CREATININE 0.91 0.92  --  0.79 0.77 0.77 0.96 0.81  CALCIUM 7.0* 7.3*  --  7.6* 7.7* 7.8* 7.9* 7.3*  MG 2.2 2.2  --  1.9 1.7 1.7  --   --   AST  --  24  --  22  --   --   --   --   ALT  --  19  --  16  --   --   --   --   ALKPHOS  --  65  --  64  --   --   --   --   BILITOT  --  0.9  --  0.5  --   --   --   --   < > = values in this interval not displayed. ------------------------------------------------------------------------------------------------------------------ estimated creatinine clearance is 54.7 mL/min (by C-G formula based on Cr of 0.81). ------------------------------------------------------------------------------------------------------------------ No results for input(s): HGBA1C in the last 72  hours. ------------------------------------------------------------------------------------------------------------------ No results for input(s): CHOL, HDL, LDLCALC, TRIG, CHOLHDL, LDLDIRECT in the last 72 hours. ------------------------------------------------------------------------------------------------------------------ No results for input(s): TSH, T4TOTAL, T3FREE, THYROIDAB in the last 72 hours.  Invalid input(s): FREET3 ------------------------------------------------------------------------------------------------------------------ No results for input(s): VITAMINB12, FOLATE, FERRITIN, TIBC, IRON, RETICCTPCT in the last 72 hours.  Coagulation profile No results for input(s): INR, PROTIME in the last 168 hours.  No results for input(s): DDIMER in the last 72 hours.  Cardiac Enzymes  Recent Labs Lab 03/04/15 1512 03/04/15 2128 03/05/15 0223  TROPONINI 0.03 0.05* 0.05*   ------------------------------------------------------------------------------------------------------------------ Invalid input(s): POCBNP     Time Spent in minutes   30 minutes   Nga Rabon M.D on 03/11/2015 at 11:09 AM  Between 7am to 7pm - Pager - [phone]  After 7pm go to [email or web address] - password [facility]  [facility]   Office  [phone]

## 2015-03-11 NOTE — Progress Notes (Addendum)
[place] for Heparin   Indication: atrial fibrillation  Allergies  Allergen Reactions  . Sulfamethoxazole Rash    Patient Measurements: Height: 5\' 3"  (160 cm) Weight: 160 lb (72.576 kg) IBW/kg (Calculated) : 52.4 HEPARIN DW (KG): 67.9   Vital Signs: Temp: 97.7 F (36.5 C) (12/09 2107) Temp Source: Oral (12/09 2107) BP: 151/47 mmHg (12/09 2107) Pulse Rate: 84 (12/09 2107)  Labs:  Recent Labs  03/09/15 0455  03/10/15 0813 03/10/15 1631 03/11/15 0709  HGB 8.9*  --  8.8*  --  7.9*  HCT 27.1*  --  26.5*  --  23.0*  PLT 225  --  299  --  301  HEPARINUNFRC  --   < > 1.14* 0.64 <0.10*  CREATININE 0.77  --  0.96  --  0.81  < > = values in this interval not displayed.  Estimated Creatinine Clearance: 54.7 mL/min (by C-G formula based on Cr of 0.81).   Medical History: Past Medical History  Diagnosis Date  . Depressive disorder, not elsewhere classified   . Anxiety state, unspecified   . Pure hypercholesterolemia   . Esophageal reflux   . Infection of esophagostomy ([place])   . Congestive heart failure, unspecified     diastolic heart failure  . Pulmonary hypertension (HCC)     PA systolic pressure [id number] by echocardiogram, PA pressure 33/10 by cardiac catheterization PA saturation 62% thermodilution cardiac index 2.0 thick cardiac index 2.4  . Bradycardia     previously with coreg  . Tricuspid regurgitation     moderate tricuspid regurgitation by echocardiogram, prior use of anorexic agents  . Melanoma ([place]) 1995    removal on back   . Coronary artery disease     a. Moderate to severe coronary disease involving the left anterior descending artery and right coronary artery.  Sequential stenosis in the LAD is significant.  Right coronary artery is moderate to severely likely nonischemic. Questionable small LVOT obstruction.  No Brockenbrough maneuver was performed.  Catheterization January 2013; b. 05/2014 MV no isch/infarct, EF  60%.  Marland Kitchen PONV (postoperative nausea and vomiting)   . Hypertension   . H/O hiatal hernia   . Gastritis   . [name]'s esophagus   . IBS (irritable bowel syndrome)   . Macular degeneration   . Dysrhythmia    Medications:  Scheduled:  . sodium chloride   Intravenous Once  . amLODipine  5 mg Oral Daily  . feeding supplement (ENSURE ENLIVE)  237 mL Oral BID BM  . guaiFENesin  1,200 mg Oral BID  . heparin  1,000 Units Intravenous Once  . losartan  100 mg Oral Daily  . metoprolol tartrate  25 mg Oral BID  . metronidazole  500 mg Intravenous Q8H  . pantoprazole  40 mg Oral Q1200  . piperacillin-tazobactam (ZOSYN)  IV  3.375 g Intravenous Q8H  . potassium & sodium phosphates  1 packet Oral TID WC & HS  . sodium chloride  10-40 mL Intracatheter Q12H  . sodium chloride  3 mL Intravenous Q12H  . vancomycin  750 mg Intravenous Q12H    Assessment: s/p Colectomy on 12/04. New Afib with chadsvasc score is 6. Heparin was held last night in anticipation of I&D.  I&D cancelled. Goal of Therapy:  Heparin level 0.3-0.7 units/ml Monitor platelets by anticoagulation protocol: Yes   Plan:  Restart heparin at 950 units/hr after 1000 unit bolus Check anti-Xa level in 6-8 hours  Thanks for allowing pharmacy to  be a part of this patient's care.  Excell Seltzer, PharmD Clinical Pharmacist  03/11/2015,8:52 AM  Addum:  Heparin level 0.1 units/ml.  No problems with infusion per RN.  Will increase drip to 1100 units/hr.  F/u am labs

## 2015-03-12 LAB — BASIC METABOLIC PANEL
Anion gap: 7 (ref 5–15)
BUN: 7 mg/dL (ref 6–20)
CHLORIDE: 105 mmol/L (ref 101–111)
CO2: 22 mmol/L (ref 22–32)
CREATININE: 0.78 mg/dL (ref 0.44–1.00)
Calcium: 7.6 mg/dL — ABNORMAL LOW (ref 8.9–10.3)
GFR calc Af Amer: >60 mL/min (ref >60)
GFR calc non Af Amer: >60 mL/min (ref >60)
GLUCOSE: 98 mg/dL (ref 65–99)
Potassium: 3.7 mmol/L (ref 3.5–5.1)
Sodium: 134 mmol/L — ABNORMAL LOW (ref 135–145)

## 2015-03-12 LAB — CBC
HEMATOCRIT: 29.6 % — AB (ref 36.0–46.0)
HEMOGLOBIN: 9.9 g/dL — AB (ref 12.0–15.0)
MCH: 26.9 pg (ref 26.0–34.0)
MCHC: 33.4 g/dL (ref 30.0–36.0)
MCV: 80.4 fL (ref 78.0–100.0)
Platelets: 338 10*3/uL (ref 150–400)
RBC: 3.68 MIL/uL — ABNORMAL LOW (ref 3.87–5.11)
RDW: 15 % (ref 11.5–15.5)
WBC: 14.2 10*3/uL — ABNORMAL HIGH (ref 4.0–10.5)

## 2015-03-12 LAB — TYPE AND SCREEN
ABO/RH(D): O POS
Antibody Screen: NEGATIVE
Unit division: 0

## 2015-03-12 LAB — MAGNESIUM: Magnesium: 1.8 mg/dL (ref 1.7–2.4)

## 2015-03-12 LAB — HEPARIN LEVEL (UNFRACTIONATED)
HEPARIN UNFRACTIONATED: 0.17 [IU]/mL — AB (ref 0.30–0.70)
Heparin Unfractionated: 0.12 IU/mL — ABNORMAL LOW (ref 0.30–0.70)

## 2015-03-12 LAB — PHOSPHORUS: PHOSPHORUS: 2.6 mg/dL (ref 2.5–4.6)

## 2015-03-12 MED ORDER — HEPARIN BOLUS VIA INFUSION
1000.0000 [IU] | Freq: Once | INTRAVENOUS | Status: AC
  Administered 2015-03-12: 1000 [IU] via INTRAVENOUS
  Filled 2015-03-12: qty 1000

## 2015-03-12 MED ORDER — POTASSIUM PHOSPHATES 15 MMOLE/5ML IV SOLN
20.0000 mmol | Freq: Once | INTRAVENOUS | Status: AC
  Administered 2015-03-12: 20 mmol via INTRAVENOUS
  Filled 2015-03-12: qty 6.67

## 2015-03-12 MED ORDER — MAGNESIUM SULFATE IN D5W 10-5 MG/ML-% IV SOLN
1.0000 g | Freq: Once | INTRAVENOUS | Status: AC
  Administered 2015-03-12: 1 g via INTRAVENOUS
  Filled 2015-03-12: qty 100

## 2015-03-12 NOTE — Progress Notes (Signed)
7 Days Post-Op  Subjective: No complaints.  Objective: Vital signs in last 24 hours: Temp:  [98 F (36.7 C)-99.2 F (37.3 C)] 98 F (36.7 C) (12/11 0535) Pulse Rate:  [60-87] 60 (12/11 0535) Resp:  [17-20] 17 (12/11 0535) BP: (121-158)/(62-87) 155/70 mmHg (12/11 0535) SpO2:  [96 %-98 %] 97 % (12/11 0535) Weight:  [72.439 kg (159 lb 11.2 oz)] 72.439 kg (159 lb 11.2 oz) (12/11 0539) Last BM Date: 03/11/15  Intake/Output from previous day: 12/10 0701 - 12/11 0700 In: 2243.8 [P.O.:120; I.V.:1823.8; IV Piggyback:300] Out: 950 [Urine:450; Drains:25; Stool:475] Intake/Output this shift:    General appearance: alert, cooperative and no distress GI: Soft. Active bowel sounds. Incision healing well. Ostomy pink and patent. JP drainage decreasing, still with cloudy fluid.  Lab Results:   Recent Labs  03/11/15 0709 03/12/15 0659  WBC 15.3* 14.2*  HGB 7.9* 9.9*  HCT 23.0* 29.6*  PLT 301 338   BMET  Recent Labs  03/11/15 0709 03/12/15 0659  NA 132* 134*  K 3.4* 3.7  CL 101 105  CO2 22 22  GLUCOSE 93 98  BUN 8 7  CREATININE 0.81 0.78  CALCIUM 7.3* 7.6*   PT/INR No results for input(s): LABPROT, INR in the last 72 hours.  Studies/Results: Ct Abdomen Pelvis W Contrast  03/11/2015  ADDENDUM REPORT: 03/11/2015 08:44 ADDENDUM: Findings and Spr[place] reviewed with Dr. Arnoldo Morale. Left hemipelvis and posterior sacral retroperitoneal collection is mostly air. No significant fluid component for drain catheter. Second Left lower quadrant small fluid collection with a single focus of air, image 64 extends slightly midline over the bladder. This remains indeterminate. White count has decreased from from 18 to 15. Patient is only 6 days postop. Will hold on percutaneous drainage and consider repeat CT next week. Electronically Signed   By: Jerilynn Mages.  Shick M.D.   On: 03/11/2015 08:44  03/11/2015  CLINICAL DATA:  80 year old who underwent sigmoid colectomy and descending colostomy related to colonic  perforation on 03/05/2015, now with worsening leukocytosis. EXAM: CT ABDOMEN AND PELVIS WITH CONTRAST TECHNIQUE: Multidetector CT imaging of the abdomen and pelvis was performed using the standard protocol following bolus administration of intravenous contrast. CONTRAST:  189mL OMNIPAQUE IOHEXOL 300 MG/ML IV. COMPARISON:  Preoperative CT abdomen and pelvis 03/03/2015, 02/10/2015. FINDINGS: Lower chest: Small to moderate-sized right pleural effusion and associated minimal passive atelectasis in the right lower lobe. Small left pleural effusion. Chronic elevation of the left hemidiaphragm with chronic scar/atelectasis in the left lower lobe. Heart moderately enlarged, particularly the left atrium. Hepatobiliary: Liver normal in size and appearance. Gallbladder unremarkable by CT. No biliary ductal dilation. Pancreas: Severe diffuse atrophy. No focal pancreatic parenchymal abnormality. Spleen: Normal in size and appearance. Surgical clips again noted adjacent to the medial spleen Adrenals/Urinary Tract: Normal appearing adrenal glands. Mildly atrophic left kidney with diffuse cortical thinning. Nonobstructing approximate 5 mm calculus in a lower pole calix of the left kidney. No right urinary tract calculi. Cortical cysts involving both kidneys, the largest arising from the lower pole of the left kidney approximating 3.5 cm. No significant focal abnormality involving either kidney. Urinary bladder contains gas, presumably related to recent instrumentation, and is otherwise normal in appearance. Stomach/Bowel: Surgical suture material involving the stomach related to prior Nissen fundoplication. Recurrent very large hiatal hernia with at least 2/3 of the stomach present in the chest, unchanged from the prior examinations. Distended loops of jejunum in the left upper quadrant of the abdomen, with normal caliber loops of distal jejunum and ileum.  Cecum decompressed which I believe accounts for apparent wall thickening,  positioned anteriorly in the upper pelvis. Approximate 2.6 cm mass in the ascending colon, obscured by stool on the preoperative CT. Satisfactory appearance of the descending colostomy. Vascular/Lymphatic: Severe atherosclerosis involving the abdominal aorta and moderate iliofemoral atherosclerosis without aneurysm. No pathologic lymphadenopathy. Reproductive: Atrophic uterus consistent with age. No adnexal masses. Other: Adjacent fluid collections with enhancing wall, containing gas, in the right side of the mid and lower pelvis. The fluid collection more superiorly has adjacent phlegmonous change and residual extraluminal gas. In total, the phlegmon and fluid collection measure approximately 4.2 x 6.6 x 4.5 cm. The fluid collection itself which contains a large amount of gas approximates 3.3 cm maximally. The fluid collection more inferiorly in the pelvis is oval-shaped and extends into the low pelvis just above the urinary bladder. The collection is difficult accurately measure, though extends over an approximate 9-10 cm superoinferior length. Phlegmonous change with associated extraluminal gas is present in the left side of the low pelvis extending into the presacral space, extending over an approximate 11 cm length. A surgical drain is in place in the low pelvis. Musculoskeletal: Degenerative disc disease at L4-5 and L5-S1. Generalized osseous demineralization. Lower thoracic spondylosis. IMPRESSION: 1. At least 3 abscesses in the left side of the pelvis as described above. Two of these collections have predominantly phlegmonous change with extraluminal gas. The largest fluid collection spans approximately 11 cm in length and would be amenable to percutaneous drainage. The other 2 predominantly phlegmonous collections would likely not be well treated by percutaneous drainage at this point. 2. Approximate 2.6 cm mass involving the ascending colon, best seen on the coronal reformatted images, worrisome for  malignancy. This lesion was obscured by stool on the preoperative CT. 3. Dilated loops of jejunum in the left upper quadrant, more likely to represent postoperative ileus than obstruction, though followup abdominal x-rays may be helpful. 4. Bilateral pleural effusions, right greater than left, with associated mild passive atelectasis in the right lower lobe. 5. Gas within the urinary bladder, presumably related to recent instrumentation. If the patient has not had a Foley catheter placed recently, an enteric-vesical fistula is possible. 6. Numerous chronic findings including large hiatal hernia, severe pancreatic atrophy, mildly atrophic left kidney with a nonobstructing 5 mm lower pole calculus. Electronically Signed: By: Evangeline Dakin M.D. On: 03/10/2015 21:08   Dg Chest Port 1 View  03/10/2015  CLINICAL DATA:  PICC line placement EXAM: PORTABLE CHEST 1 VIEW COMPARISON:  Radiograph 03/05/2015 FINDINGS: Interval placement of a RIGHT PICC line which tip deflects into the brachiocephalic vein. LEFT central venous line with tip in the proximal brachiocephalic vein. No pneumothorax. Improvement in RIGHT lower lobe atelectasis. IMPRESSION: 1. RIGHT PICC line with tip deflected into the distal aspect the brachiocephalic vein. 2. Improvement in RIGHT lower lobe atelectasis. Electronically Signed   By: Suzy Bouchard M.D.   On: 03/10/2015 14:27   Dg Chest Port 1v Same Day  03/10/2015  CLINICAL DATA:  PICC line placement. Congestive heart failure and coronary artery disease. EXAM: PORTABLE CHEST 1 VIEW COMPARISON:  03/10/2015 FINDINGS: The right arm PICC line has been repositioned, with tip now all in the mid to distal SVC. Left subclavian central venous catheter tip overlies the left innominate vein is unchanged in position. No evidence of pneumothorax or pleural effusion. Heart size is stable. Both lungs are clear. Moderate to large hiatal hernia again noted. IMPRESSION: Right arm PICC line now in appropriate  position. No active lung disease.  Hiatal hernia. Electronically Signed   By: Earle Gell M.D.   On: 03/10/2015 17:22   Dg Chest Port 1v Same Day  03/10/2015  CLINICAL DATA:  Re- ingested PICC EXAM: PORTABLE CHEST 1 VIEW COMPARISON:  Earlier today FINDINGS: Unchanged appearance of the right upper extremity PICC with tip in the distal left brachiocephalic vein. Unchanged left-sided PICC, tip at the left brachiocephalic vein. Chronic cardiomegaly. Right lower mediastinal contour abnormality correlating with large hiatal hernia. There is no edema, consolidation, effusion, or pneumothorax. IMPRESSION: Unchanged positioning of bilateral upper extremity PICCs, with both tips at the left brachiocephalic vein. Electronically Signed   By: Monte Fantasia M.D.   On: 03/10/2015 15:14    Anti-infectives: Anti-infectives    Start     Dose/Rate Route Frequency Ordered Stop   03/11/15 0900  vancomycin (VANCOCIN) IVPB 750 mg/150 ml premix     750 mg 150 mL/hr over 60 Minutes Intravenous Every 12 hours 03/11/15 0850     03/10/15 1015  vancomycin (VANCOCIN) 1,500 mg in sodium chloride 0.9 % 500 mL IVPB     1,500 mg 250 mL/hr over 120 Minutes Intravenous  Once 03/10/15 1013 03/10/15 1252   03/09/15 0930  metroNIDAZOLE (FLAGYL) IVPB 500 mg     500 mg 100 mL/hr over 60 Minutes Intravenous Every 8 hours 03/09/15 0919     03/03/15 0900  piperacillin-tazobactam (ZOSYN) IVPB 3.375 g     3.375 g 12.5 mL/hr over 240 Minutes Intravenous Every 8 hours 03/03/15 0739     03/03/15 0100  piperacillin-tazobactam (ZOSYN) IVPB 3.375 g     3.375 g 100 mL/hr over 30 Minutes Intravenous  Once 03/03/15 0049 03/03/15 0121      Assessment/Plan: s/p Procedure(s): PARTIAL COLECTOMY WITH COLOSTOMY CREATION/HARTMANN PROCEDURE CENTRAL LINE INSERTION Impression: Continuing to slowly improve. Leukocytosis slowly resolving. Continue current therapy.  LOS: 9 days    Saul Dorsi A 03/12/2015

## 2015-03-12 NOTE — Progress Notes (Signed)
Patient Demographics  [name], is a 80 y.o. female, DOB - [birth year], [id number]  Admit date - 03/02/2015   Admitting Physician Phillips Grout, MD  Outpatient Primary MD for the patient is [name]  LOS - 9   Chief Complaint  Patient presents with  . Abdominal Pain       Admission HPI/Brief narrative: 57 yow presented with complaints of LLQ abdominal pain with associated nausea. CT scan reveal perforated bowel. General surgery preformed partial colectomy 120/04 with no immediate complications. Her overall hospital course has been complicated by development of Afib. She has been started on rate control and since converted back to SR. Her CHADSVASc score is 6, cardiology consulted, and recommended to continue patient on anticoagulation. Repeat CT abdomen with questionable abscess, Dr. Arnoldo Morale discussed with interventional radiology, these findings more consistent with postoperative changes.  Subjective:   [name] today has, No headache, No chest pain, No new weakness tingling or numbness, No Cough - SOB, reports abdominal pain improving , tolerating soft diet.  Assessment & Plan    Principal Problem:   Perforated sigmoid colon ([place]) Active Problems:   HYPERCHOLESTEROLEMIA   Anxiety state   DIASTOLIC DYSFUNCTION   GERD   HTN (hypertension)   Coronary artery disease   Abdominal pain   Lactic acidosis   Prolonged Q-T interval on ECG   CKD (chronic kidney disease) stage 3, GFR 30-59 ml/min   AKI (acute kidney injury) ([place])   Atrial flutter ([place])   perforated sigmoid colon  - As evident on CT abdomen pelvis on 12/2 . - Surgical consult greatly appreciated, status post partial colectomy with colostomy/Hartmann procedure  on 03/05/2015, pathology significant for DIVERTICULITIS WITH ABSCESS, PERFORATION AND ASSOCIATED ACUTE SEROSITIS. - Management per surgery, pain is well  controlled, tolerating soft diet. - Repeat CT abdomen pelvis 12/9 done secondary to worsening leukocytosis, initial findings suggestive of abscess, Dr. Arnoldo Morale discussed with interventional radiology, most of these findings are postoperative, or indeterminant, will hold on Protonix drain, will repeat imaging in 1 week for further evaluation.  . -  JP drain still putting out dark cloudy material ,so will continue with IV Zosyn, IV Flagyl added on 12/8 by surgery,  Leukocytosis - Please see above discussion. -  as well  discontinue patient left subclavian TLC 12/9 ,  started empirically on IV vancomycin, blood cultures no growth to date so far, will discontinue IV vancomycin and a.m. if blood cultures remain negative.  Finding of mass in ascending colon - This will need to be further worked up and patient is more stable from surgical point of view.   paroxysmal A. fib with RVR - Postoperative, currently converted to normal sinus rhythm, off Cardizem drip, started on metoprolol.  - CHADSVASC=6  Started on eliquis, back on heparin GTT, pending further workup.  Acute on chronic kidney  stage III  - Likely related to IV contrast, resolved with IV fluids   Anemia - hemoglobin of 7.9 /10, transfuse 1 unit PRBC 12/10, glycohemoglobin is 9.9 today .  Hypokalemia/hypophosphatemia  - Replete as needed  GERD  - Continue with PPI   Coronary artery disease  - Continue with aspirin and statin  Hypertension  - Better controlled after starting  amlodipine, continue with metoprolol and losartan.   hyperlipidemia - Continue with statin  Code Status: full  Family Communication: discussed with patient  Disposition Plan: SNF when stable   Procedures  - partial colectomy with colostomy/Hartmann procedure  on 03/05/2015 - PICC line 12/9 - Discontinued left subclavian TLC 12/9 - 1 unit PRBC 12/10   Consults   Gen. surgery   cardiology    Medications  Scheduled Meds: . amLODipine  5 mg  Oral Daily  . feeding supplement (ENSURE ENLIVE)  237 mL Oral BID BM  . guaiFENesin  1,200 mg Oral BID  . losartan  100 mg Oral Daily  . magnesium sulfate 1 - 4 g bolus IVPB  1 g Intravenous Once  . metoprolol tartrate  25 mg Oral BID  . metronidazole  500 mg Intravenous Q8H  . pantoprazole  40 mg Oral Q1200  . piperacillin-tazobactam (ZOSYN)  IV  3.375 g Intravenous Q8H  . potassium & sodium phosphates  1 packet Oral TID WC & HS  . potassium phosphate IVPB (mmol)  20 mmol Intravenous Once  . sodium chloride  10-40 mL Intracatheter Q12H  . sodium chloride  3 mL Intravenous Q12H  . vancomycin  750 mg Intravenous Q12H   Continuous Infusions: . sodium chloride 75 mL/hr at 03/11/15 1902  . heparin 1,200 Units/hr (03/12/15 0754)   PRN Meds:.acetaminophen, ALPRAZolam, alum & mag hydroxide-simeth, hydrALAZINE, HYDROmorphone (DILAUDID) injection, magnesium hydroxide, ondansetron **OR** ondansetron (ZOFRAN) IV, simethicone, sodium chloride  DVT Prophylaxis  Heparin GTT  Lab Results  Component Value Date   PLT 338 03/12/2015    Antibiotics    Anti-infectives    Start     Dose/Rate Route Frequency Ordered Stop   03/11/15 0900  vancomycin (VANCOCIN) IVPB 750 mg/150 ml premix     750 mg 150 mL/hr over 60 Minutes Intravenous Every 12 hours 03/11/15 0850     03/10/15 1015  vancomycin (VANCOCIN) 1,500 mg in sodium chloride 0.9 % 500 mL IVPB     1,500 mg 250 mL/hr over 120 Minutes Intravenous  Once 03/10/15 1013 03/10/15 1252   03/09/15 0930  metroNIDAZOLE (FLAGYL) IVPB 500 mg     500 mg 100 mL/hr over 60 Minutes Intravenous Every 8 hours 03/09/15 0919     03/03/15 0900  piperacillin-tazobactam (ZOSYN) IVPB 3.375 g     3.375 g 12.5 mL/hr over 240 Minutes Intravenous Every 8 hours 03/03/15 0739     03/03/15 0100  piperacillin-tazobactam (ZOSYN) IVPB 3.375 g     3.375 g 100 mL/hr over 30 Minutes Intravenous  Once 03/03/15 0049 03/03/15 0121          Objective:   Filed Vitals:    03/11/15 2030 03/11/15 2252 03/12/15 0535 03/12/15 0539  BP: 121/87 156/84 155/70   Pulse: 78 87 60   Temp: 98.3 F (36.8 C) 99.2 F (37.3 C) 98 F (36.7 C)   TempSrc: Oral Oral Oral   Resp: 20 20 17    Height:      Weight:    72.439 kg (159 lb 11.2 oz)  SpO2: 98% 97% 97%     Wt Readings from Last 3 Encounters:  03/12/15 72.439 kg (159 lb 11.2 oz)  02/14/15 67.042 kg (147 lb 12.8 oz)  02/10/15 67.132 kg (148 lb)     Intake/Output Summary (Last 24 hours) at 03/12/15 1210 Last data filed at 03/12/15 0900  Gross per 24 hour  Intake 2363.75 ml  Output    200 ml  Net 2163.75  ml     Physical Exam  General: NAD. Appears calm and looks comfortable lying in bed.   Cardiovascular: Irregular   Respiratory: clear bilaterally, No wheezing, rales or rhonchi  Abdomen: soft, no distention, Colostomy noted, midline surgical scar with no bleed or discharge, JP drain right lower abdomen  Musculoskeletal: No edema b/l  Psychiatric: Oriented to self, place, and reason for being in the hospital.    Data Review   Micro Results Recent Results (from the past 240 hour(s))  Culture, blood (routine x 2)     Status: None   Collection Time: 03/02/15 11:10 PM  Result Value Ref Range Status   Specimen Description BLOOD RIGHT HAND  Final   Special Requests BOTTLES DRAWN AEROBIC AND ANAEROBIC 4CC EACH  Final   Culture NO GROWTH 5 DAYS  Final   Report Status 03/07/2015 FINAL  Final  Culture, blood (routine x 2)     Status: None   Collection Time: 03/02/15 11:23 PM  Result Value Ref Range Status   Specimen Description BLOOD LEFT FOREARM  Final   Special Requests   Final    BOTTLES DRAWN AEROBIC AND ANAEROBIC AEB=4CC ANA=2CC   Culture NO GROWTH 5 DAYS  Final   Report Status 03/07/2015 FINAL  Final  Surgical pcr screen     Status: None   Collection Time: 03/03/15  2:45 AM  Result Value Ref Range Status   MRSA, PCR NEGATIVE NEGATIVE Final   Staphylococcus aureus NEGATIVE NEGATIVE  Final    Comment:        The Xpert SA Assay (FDA approved for NASAL specimens in patients over 2 years of age), is one component of a comprehensive surveillance program.  Test performance has been validated by [facility] for patients greater than or equal to 103 year old. It is not intended to diagnose infection nor to guide or monitor treatment.   MRSA PCR Screening     Status: None   Collection Time: 03/04/15  1:43 PM  Result Value Ref Range Status   MRSA by PCR NEGATIVE NEGATIVE Final    Comment:        The GeneXpert MRSA Assay (FDA approved for NASAL specimens only), is one component of a comprehensive MRSA colonization surveillance program. It is not intended to diagnose MRSA infection nor to guide or monitor treatment for MRSA infections.   Culture, blood (routine x 2)     Status: None (Preliminary result)   Collection Time: 03/10/15  9:12 AM  Result Value Ref Range Status   Specimen Description RIGHT ANTECUBITAL  Final   Special Requests BOTTLES DRAWN AEROBIC AND ANAEROBIC 12CC EACH  Final   Culture NO GROWTH < 24 HOURS  Final   Report Status PENDING  Incomplete  Culture, blood (routine x 2)     Status: None (Preliminary result)   Collection Time: 03/10/15  9:18 AM  Result Value Ref Range Status   Specimen Description ARM  Final   Special Requests BOTTLES DRAWN AEROBIC AND ANAEROBIC PICC 6 CC EACH  Final   Culture NO GROWTH < 24 HOURS  Final   Report Status PENDING  Incomplete    Radiology Reports Dg Chest 2 View  02/10/2015  CLINICAL DATA:  Right flank pain with vomiting urinary frequency since this morning. History of melanoma and CHF and hypertension. EXAM: CHEST  2 VIEW COMPARISON:  05/07/2014 FINDINGS: Large hiatal hernia is similar to the prior study. Cardiac silhouette is mildly enlarged. No mediastinal or hilar masses or  convincing adenopathy. Clear lungs.  No pleural effusion or pneumothorax. Bony thorax is demineralized but grossly intact. IMPRESSION:  No acute cardiopulmonary disease. Electronically Signed   By: Lajean Manes M.D.   On: 02/10/2015 18:22   Ct Angio Chest Pe W/cm &/or Wo Cm  02/10/2015  CLINICAL DATA:  Nausea and vomiting and upper back pain. EXAM: CT ANGIOGRAPHY CHEST WITH CONTRAST TECHNIQUE: Multidetector CT imaging of the chest was performed using the standard protocol during bolus administration of intravenous contrast. Multiplanar CT image reconstructions and MIPs were obtained to evaluate the vascular anatomy. CONTRAST:  54mL OMNIPAQUE IOHEXOL 350 MG/ML SOLN COMPARISON:  Radiography same day FINDINGS: Pulmonary arterial opacification is excellent. There are no pulmonary emboli. The aorta shows atherosclerosis but there is no aneurysm or dissection. There is extensive coronary artery calcification. There is a very large hiatal hernia apparently filled with ingested material. There is not much within the intra abdominal portion of the gastric antrum. This raises the question/ potential for some degree of obstruction. The esophagus proximal to that is dilated and fluid and air-filled. The lung parenchyma shows emphysema. There is compressive volume loss primarily in the right medial lower lung secondary to the large hiatal hernia. There is no pleural or pericardial fluid. Scans in the upper abdomen do not show any significant lesion. No significant bone finding. Review of the MIP images confirms the above findings. IMPRESSION: Large hiatal hernia filled with ingested material. There could possibly be relative outflow obstruction from this hernia given this appearance. No pulmonary emboli. Electronically Signed   By: Nelson Chimes M.D.   On: 02/10/2015 21:52   Ct Abdomen Pelvis W Contrast  03/11/2015  ADDENDUM REPORT: 03/11/2015 08:44 ADDENDUM: Findings and [place] reviewed with Dr. Arnoldo Morale. Left hemipelvis and posterior sacral retroperitoneal collection is mostly air. No significant fluid component for drain catheter. Second Left lower  quadrant small fluid collection with a single focus of air, image 64 extends slightly midline over the bladder. This remains indeterminate. White count has decreased from from 18 to 15. Patient is only 6 days postop. Will hold on percutaneous drainage and consider repeat CT next week. Electronically Signed   By: Jerilynn Mages.  Shick M.D.   On: 03/11/2015 08:44  03/11/2015  CLINICAL DATA:  80 year old who underwent sigmoid colectomy and descending colostomy related to colonic perforation on 03/05/2015, now with worsening leukocytosis. EXAM: CT ABDOMEN AND PELVIS WITH CONTRAST TECHNIQUE: Multidetector CT imaging of the abdomen and pelvis was performed using the standard protocol following bolus administration of intravenous contrast. CONTRAST:  168mL OMNIPAQUE IOHEXOL 300 MG/ML IV. COMPARISON:  Preoperative CT abdomen and pelvis 03/03/2015, 02/10/2015. FINDINGS: Lower chest: Small to moderate-sized right pleural effusion and associated minimal passive atelectasis in the right lower lobe. Small left pleural effusion. Chronic elevation of the left hemidiaphragm with chronic scar/atelectasis in the left lower lobe. Heart moderately enlarged, particularly the left atrium. Hepatobiliary: Liver normal in size and appearance. Gallbladder unremarkable by CT. No biliary ductal dilation. Pancreas: Severe diffuse atrophy. No focal pancreatic parenchymal abnormality. Spleen: Normal in size and appearance. Surgical clips again noted adjacent to the medial spleen Adrenals/Urinary Tract: Normal appearing adrenal glands. Mildly atrophic left kidney with diffuse cortical thinning. Nonobstructing approximate 5 mm calculus in a lower pole calix of the left kidney. No right urinary tract calculi. Cortical cysts involving both kidneys, the largest arising from the lower pole of the left kidney approximating 3.5 cm. No significant focal abnormality involving either kidney. Urinary bladder contains gas, presumably related to recent  instrumentation,  and is otherwise normal in appearance. Stomach/Bowel: Surgical suture material involving the stomach related to prior Nissen fundoplication. Recurrent very large hiatal hernia with at least 2/3 of the stomach present in the chest, unchanged from the prior examinations. Distended loops of jejunum in the left upper quadrant of the abdomen, with normal caliber loops of distal jejunum and ileum. Cecum decompressed which I believe accounts for apparent wall thickening, positioned anteriorly in the upper pelvis. Approximate 2.6 cm mass in the ascending colon, obscured by stool on the preoperative CT. Satisfactory appearance of the descending colostomy. Vascular/Lymphatic: Severe atherosclerosis involving the abdominal aorta and moderate iliofemoral atherosclerosis without aneurysm. No pathologic lymphadenopathy. Reproductive: Atrophic uterus consistent with age. No adnexal masses. Other: Adjacent fluid collections with enhancing wall, containing gas, in the right side of the mid and lower pelvis. The fluid collection more superiorly has adjacent phlegmonous change and residual extraluminal gas. In total, the phlegmon and fluid collection measure approximately 4.2 x 6.6 x 4.5 cm. The fluid collection itself which contains a large amount of gas approximates 3.3 cm maximally. The fluid collection more inferiorly in the pelvis is oval-shaped and extends into the low pelvis just above the urinary bladder. The collection is difficult accurately measure, though extends over an approximate 9-10 cm superoinferior length. Phlegmonous change with associated extraluminal gas is present in the left side of the low pelvis extending into the presacral space, extending over an approximate 11 cm length. A surgical drain is in place in the low pelvis. Musculoskeletal: Degenerative disc disease at L4-5 and L5-S1. Generalized osseous demineralization. Lower thoracic spondylosis. IMPRESSION: 1. At least 3 abscesses in the left side of the  pelvis as described above. Two of these collections have predominantly phlegmonous change with extraluminal gas. The largest fluid collection spans approximately 11 cm in length and would be amenable to percutaneous drainage. The other 2 predominantly phlegmonous collections would likely not be well treated by percutaneous drainage at this point. 2. Approximate 2.6 cm mass involving the ascending colon, best seen on the coronal reformatted images, worrisome for malignancy. This lesion was obscured by stool on the preoperative CT. 3. Dilated loops of jejunum in the left upper quadrant, more likely to represent postoperative ileus than obstruction, though followup abdominal x-rays may be helpful. 4. Bilateral pleural effusions, right greater than left, with associated mild passive atelectasis in the right lower lobe. 5. Gas within the urinary bladder, presumably related to recent instrumentation. If the patient has not had a Foley catheter placed recently, an enteric-vesical fistula is possible. 6. Numerous chronic findings including large hiatal hernia, severe pancreatic atrophy, mildly atrophic left kidney with a nonobstructing 5 mm lower pole calculus. Electronically Signed: By: Evangeline Dakin M.D. On: 03/10/2015 21:08   Ct Abdomen Pelvis W Contrast  03/03/2015  CLINICAL DATA:  Sudden onset abdominal pain in the left lower quadrant at 17:00. EXAM: CT ABDOMEN AND PELVIS WITH CONTRAST TECHNIQUE: Multidetector CT imaging of the abdomen and pelvis was performed using the standard protocol following bolus administration of intravenous contrast. CONTRAST:  140mL OMNIPAQUE IOHEXOL 300 MG/ML  SOLN COMPARISON:  02/10/2015 FINDINGS: There is a large volume of extraluminal air, predominantly retroperitoneal. The extraluminal air probably originates from a perforation of the mid sigmoid colon. There is no bowel obstruction. No abscess or drainable fluid collection is evident. Large hiatal hernia again noted. There are  unremarkable appearances of the liver, spleen, pancreas, adrenals and kidneys except for a few benign renal cysts. Uterus and ovaries are unremarkable.  Abdominal aorta is normal in caliber with moderate atherosclerotic calcification. No significant abnormalities evident in the lower chest. No significant musculoskeletal lesion is evident. IMPRESSION: Large volume extraluminal air, predominantly retroperitoneal, probably originating from a perforation of the mid sigmoid colon. Critical Value/emergent results were called by telephone at the time of interpretation on 03/03/2015 at 12:45 am to Dr. Tomi Bamberger , who verbally acknowledged these results. Electronically Signed   By: Andreas Newport M.D.   On: 03/03/2015 00:49   Dg Chest Port 1 View  03/10/2015  CLINICAL DATA:  PICC line placement EXAM: PORTABLE CHEST 1 VIEW COMPARISON:  Radiograph 03/05/2015 FINDINGS: Interval placement of a RIGHT PICC line which tip deflects into the brachiocephalic vein. LEFT central venous line with tip in the proximal brachiocephalic vein. No pneumothorax. Improvement in RIGHT lower lobe atelectasis. IMPRESSION: 1. RIGHT PICC line with tip deflected into the distal aspect the brachiocephalic vein. 2. Improvement in RIGHT lower lobe atelectasis. Electronically Signed   By: Suzy Bouchard M.D.   On: 03/10/2015 14:27   Dg Chest Port 1 View  03/05/2015  CLINICAL DATA:  Central line placement. EXAM: PORTABLE CHEST 1 VIEW COMPARISON:  03/04/2015 and 03/02/2015. FINDINGS: 1304 hours. New left subclavian central venous catheter projected laterally at the mid SVC level near the origin of the azygos vein. The heart size and mediastinal contours are stable with a known hiatal hernia. There is no pneumothorax. Medial right basilar airspace disease appears unchanged. The left lung is clear. IMPRESSION: Central venous catheter tip at the mid SVC level near the origin of the azygos vein. No pneumothorax. No significant change in medial right  basilar airspace disease. Electronically Signed   By: Richardean Sale M.D.   On: 03/05/2015 14:15   Dg Chest Port 1 View  03/04/2015  CLINICAL DATA:  Hypoxia EXAM: PORTABLE CHEST 1 VIEW COMPARISON:  Two days ago FINDINGS: There is streaky right infrahilar lung opacity which is newly developed. No edema, effusion, or pneumothorax. Lung volumes are low. Chronic cardiomegaly with superimposed large hiatal hernia. IMPRESSION: New right middle lobe atelectasis or consolidation. Given gas-filled hiatal hernia, question underlying aspiration. Electronically Signed   By: Monte Fantasia M.D.   On: 03/04/2015 15:43   Dg Chest Port 1v Same Day  03/10/2015  CLINICAL DATA:  PICC line placement. Congestive heart failure and coronary artery disease. EXAM: PORTABLE CHEST 1 VIEW COMPARISON:  03/10/2015 FINDINGS: The right arm PICC line has been repositioned, with tip now all in the mid to distal SVC. Left subclavian central venous catheter tip overlies the left innominate vein is unchanged in position. No evidence of pneumothorax or pleural effusion. Heart size is stable. Both lungs are clear. Moderate to large hiatal hernia again noted. IMPRESSION: Right arm PICC line now in appropriate position. No active lung disease.  Hiatal hernia. Electronically Signed   By: Earle Gell M.D.   On: 03/10/2015 17:22   Dg Chest Port 1v Same Day  03/10/2015  CLINICAL DATA:  Re- ingested PICC EXAM: PORTABLE CHEST 1 VIEW COMPARISON:  Earlier today FINDINGS: Unchanged appearance of the right upper extremity PICC with tip in the distal left brachiocephalic vein. Unchanged left-sided PICC, tip at the left brachiocephalic vein. Chronic cardiomegaly. Right lower mediastinal contour abnormality correlating with large hiatal hernia. There is no edema, consolidation, effusion, or pneumothorax. IMPRESSION: Unchanged positioning of bilateral upper extremity PICCs, with both tips at the left brachiocephalic vein. Electronically Signed   By: Monte Fantasia M.D.   On: 03/10/2015 15:14  Dg Abd Acute W/chest  03/02/2015  CLINICAL DATA:  Left lower quadrant pain. Bloody diarrhea. Shortness of breath. EXAM: DG ABDOMEN ACUTE W/ 1V CHEST COMPARISON:  None. FINDINGS: There is no evidence of dilated bowel loops or free intraperitoneal air. Large amount of stool throughout the colon. No radiopaque calculi or other significant radiographic abnormality is seen. Heart size and mediastinal contours are within normal limits. Both lungs are clear. IMPRESSION: Large amount of stool throughout the colon. No acute cardiopulmonary disease. Electronically Signed   By: Kathreen Devoid   On: 03/02/2015 23:15   Dg Duanne Limerick W/high Density W/kub  02/17/2015  CLINICAL DATA:  Patient status post partial reduction of esophageal large hiatal hernia. Patient has intermittent RIGHT-sided pain and vomiting. EXAM: UPPER GI SERIES WITH KUB TECHNIQUE: After obtaining a scout radiograph a routine upper GI series was performed using high-density FLUOROSCOPY TIME:  Radiation Exposure Index (as provided by the fluoroscopic device): If the device does not provide the exposure index: Fluoroscopy Time (in minutes and seconds):  1 minutes 48 seconds Number of Acquired Images:  15 COMPARISON:  CT 02/10/2015, upper GI 09/27/2014 FINDINGS: Initial KUB demonstrates surgical clips in the gastric cardia region. Administration of contrast demonstrate no stricture or mass within the esophagus. Contrast flowed into the stomach. There is a large hiatal hernia present with approximately 60% of the stomach above the diaphragm. No retained food stuff within the stomach. The herniated stomach is within the the central and RIGHT side of the mediastinum. The herniated stomach is patulous and retains barium but the barium eventually flows through the diaphragmatic hiatus into the gastric antrum. Ultimately contrast flows into the C-loop of the duodenum and ligament Treitz in the LEFT upper quadrant. There is some  retention of contrast within the stomach at the end of the study. IMPRESSION: 1. Large sliding-type hiatal hernia which is patulous and retains oral contrast. 2. No evidence of high-grade obstruction with contrast flowing into the proximal small bowel. 3. No evidence of esophageal obstruction. 4. No retained food stuff within the stomach at beginning of study. Electronically Signed   By: Suzy Bouchard M.D.   On: 02/17/2015 12:05   Ct Renal Stone Study  02/10/2015  CLINICAL DATA:  Right flank pain since 4 p.m. today. Urinary frequency. Nausea and vomiting. EXAM: CT ABDOMEN AND PELVIS WITHOUT CONTRAST TECHNIQUE: Multidetector CT imaging of the abdomen and pelvis was performed following the standard protocol without IV contrast. COMPARISON:  None. FINDINGS: Lung bases: Large hiatal hernia. There are bowel anastomosis staples along the anterior margin of the herniated stomach. Minor subsegmental atelectasis in the right lower lobe adjacent to the hernia. 3-4 mm nodule in the lateral left lung base, image 7, series 6. Heart normal in size. There are coronary artery calcifications. Liver, spleen, gallbladder, pancreas, adrenal glands:  Unremarkable. There are surgical vascular clips adjacent to the pancreatic tail. Kidneys, ureters, bladder: No ureteral stone or obstructive uropathy. No collecting system dilation. Single 3-4 mm nonobstructing stone in the lower pole the right kidney. 4 mm nonobstructing stone in the lower pole of the left kidney. Bilateral low-density renal masses consistent with cysts. Ureters normal course and in caliber. Bladder is mostly decompressed but otherwise unremarkable. Uterus and adnexa:  Unremarkable. Lymph nodes:  No adenopathy. Ascites:  None. Gastrointestinal: Cecum extends across midline to the left central abdomen. There are few left colon diverticula. No diverticulitis. Colon otherwise unremarkable. Normal small bowel. Normal appendix visualized. Abdominal wall:  No significant  hernia. Vascular: Atherosclerotic calcifications  are noted along the aorta and its branch vessels. No aneurysm. Musculoskeletal: Disc and facet degenerative changes in the lower lumbar spine. No osteoblastic or osteolytic lesions. IMPRESSION: 1. No acute findings. 2. No evidence of a ureteral stone or obstructive uropathy. No findings to explain the patient's right flank pain. 3. There are single nonobstructing intrarenal stones in each kidney as well as bilateral renal cysts. 4. Scattered left colon diverticula without diverticulitis. 5. Normal appendix visualized. 6. Large hiatal hernia. Electronically Signed   By: Lajean Manes M.D.   On: 02/10/2015 18:21     CBC  Recent Labs Lab 03/08/15 0333 03/09/15 0455 03/10/15 0813 03/11/15 0709 03/12/15 0659  WBC 11.5* 17.8* 18.8* 15.3* 14.2*  HGB 9.1* 8.9* 8.8* 7.9* 9.9*  HCT 27.2* 27.1* 26.5* 23.0* 29.6*  PLT 173 225 299 301 338  MCV 79.1 79.2 79.6 78.8 80.4  MCH 26.5 26.0 26.4 27.1 26.9  MCHC 33.5 32.8 33.2 34.3 33.4  RDW 15.0 15.0 14.7 14.8 15.0    Chemistries   Recent Labs Lab 03/06/15 0801  03/07/15 0420 03/08/15 0333 03/09/15 0455 03/10/15 0813 03/11/15 0709 03/12/15 0659  NA 140  --  135 132* 133* 134* 132* 134*  K 2.7*  < > 4.3 4.2 3.7 4.1 3.4* 3.7  CL 111  --  107 104 101 101 101 105  CO2 21*  --  23 23 24 25 22 22   GLUCOSE 123*  --  122* 112* 83 110* 93 98  BUN 20  --  14 11 13 11 8 7   CREATININE 0.92  --  0.79 0.77 0.77 0.96 0.81 0.78  CALCIUM 7.3*  --  7.6* 7.7* 7.8* 7.9* 7.3* 7.6*  MG 2.2  --  1.9 1.7 1.7  --   --  1.8  AST 24  --  22  --   --   --   --   --   ALT 19  --  16  --   --   --   --   --   ALKPHOS 65  --  64  --   --   --   --   --   BILITOT 0.9  --  0.5  --   --   --   --   --   < > = values in this interval not displayed. ------------------------------------------------------------------------------------------------------------------ estimated creatinine clearance is 55.3 mL/min (by C-G formula  based on Cr of 0.78). ------------------------------------------------------------------------------------------------------------------ No results for input(s): HGBA1C in the last 72 hours. ------------------------------------------------------------------------------------------------------------------ No results for input(s): CHOL, HDL, LDLCALC, TRIG, CHOLHDL, LDLDIRECT in the last 72 hours. ------------------------------------------------------------------------------------------------------------------ No results for input(s): TSH, T4TOTAL, T3FREE, THYROIDAB in the last 72 hours.  Invalid input(s): FREET3 ------------------------------------------------------------------------------------------------------------------ No results for input(s): VITAMINB12, FOLATE, FERRITIN, TIBC, IRON, RETICCTPCT in the last 72 hours.  Coagulation profile No results for input(s): INR, PROTIME in the last 168 hours.  No results for input(s): DDIMER in the last 72 hours.  Cardiac Enzymes No results for input(s): CKMB, TROPONINI, MYOGLOBIN in the last 168 hours.  Invalid input(s): CK ------------------------------------------------------------------------------------------------------------------ Invalid input(s): POCBNP     Time Spent in minutes   30 minutes   Mikhail Hallenbeck M.D on 03/12/2015 at 12:10 PM  Between 7am to 7pm - Pager - [phone]  After 7pm go to [email or web address] - password [facility]  [facility]   Office  [phone]

## 2015-03-12 NOTE — Progress Notes (Addendum)
[place] for Heparin   Indication: atrial fibrillation  Allergies  Allergen Reactions  . Sulfamethoxazole Rash    Patient Measurements: Height: 5\' 3"  (160 cm) Weight: 159 lb 11.2 oz (72.439 kg) IBW/kg (Calculated) : 52.4 HEPARIN DW (KG): 67.9   Vital Signs: Temp: 98 F (36.7 C) (12/11 0535) Temp Source: Oral (12/11 0535) BP: 155/70 mmHg (12/11 0535) Pulse Rate: 60 (12/11 0535)  Labs:  Recent Labs  03/10/15 0813  03/11/15 0709 03/11/15 1808 03/12/15 0659  HGB 8.8*  --  7.9*  --  9.9*  HCT 26.5*  --  23.0*  --  29.6*  PLT 299  --  301  --  338  HEPARINUNFRC 1.14*  < > <0.10* <0.10* 0.12*  CREATININE 0.96  --  0.81  --  0.78  < > = values in this interval not displayed.  Estimated Creatinine Clearance: 55.3 mL/min (by C-G formula based on Cr of 0.78).   Medical History: Past Medical History  Diagnosis Date  . Depressive disorder, not elsewhere classified   . Anxiety state, unspecified   . Pure hypercholesterolemia   . Esophageal reflux   . 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 Congestive heart failure, unspecified     diastolic heart failure  . Pulmonary hypertension (HCC)     PA systolic pressure [id number] mmHg by echocardiogram, PA pressure 33/10 by cardiac catheterization PA saturation 62% thermodilution cardiac index 2.0 thick cardiac index 2.4  . Bradycardia     previously with coreg  . Tricuspid regurgitation     moderate tricuspid regurgitation by echocardiogram, prior use of anorexic agents  . Melanoma ([place]) 1995    removal on back   . Coronary artery disease     a. Moderate to severe coronary disease involving the left anterior descending artery and right coronary artery.  Sequential stenosis in the LAD is significant.  Right coronary artery is moderate to severely likely nonischemic. Questionable small LVOT obstruction.  No Brockenbrough maneuver was performed.  Catheterization January 2013; b. 05/2014 MV no  isch/infarct, EF 60%.  Marland Kitchen PONV (postoperative nausea and vomiting)   . Hypertension   . H/O hiatal hernia   . Gastritis   . [name]'s esophagus   . IBS (irritable bowel syndrome)   . Macular degeneration   . Dysrhythmia    Medications:  Scheduled:  . amLODipine  5 mg Oral Daily  . feeding supplement (ENSURE ENLIVE)  237 mL Oral BID BM  . guaiFENesin  1,200 mg Oral BID  . losartan  100 mg Oral Daily  . metoprolol tartrate  25 mg Oral BID  . metronidazole  500 mg Intravenous Q8H  . pantoprazole  40 mg Oral Q1200  . piperacillin-tazobactam (ZOSYN)  IV  3.375 g Intravenous Q8H  . potassium & sodium phosphates  1 packet Oral TID WC & HS  . sodium chloride  10-40 mL Intracatheter Q12H  . sodium chloride  3 mL Intravenous Q12H  . vancomycin  750 mg Intravenous Q12H    Assessment: s/p Colectomy on 12/04. New Afib with chadsvasc score of 6.  Heparin level this am is subtherapeutic.  No bleeding noted Goal of Therapy:  Heparin level 0.3-0.7 units/ml Monitor platelets by anticoagulation protocol: Yes   Plan:  Increase heparin drip to 1200 units/hr and give 1000 unit bolus Check anti-Xa level in 6-8 hours  Thanks for allowing pharmacy to be a part of this patient's care.  Excell Seltzer, PharmD Clinical Pharmacist  03/12/2015,8:12  AM    Addum:  Heparin level 0.17 units/ml.  Increase drip to 1400 units/hr.  F/u am labs

## 2015-03-13 LAB — BASIC METABOLIC PANEL
Anion gap: 6 (ref 5–15)
BUN: 6 mg/dL (ref 6–20)
CALCIUM: 7.6 mg/dL — AB (ref 8.9–10.3)
CHLORIDE: 106 mmol/L (ref 101–111)
CO2: 23 mmol/L (ref 22–32)
CREATININE: 0.81 mg/dL (ref 0.44–1.00)
GFR calc Af Amer: >60 mL/min (ref >60)
GFR calc non Af Amer: >60 mL/min (ref >60)
Glucose, Bld: 103 mg/dL — ABNORMAL HIGH (ref 65–99)
Potassium: 3.5 mmol/L (ref 3.5–5.1)
SODIUM: 135 mmol/L (ref 135–145)

## 2015-03-13 LAB — CBC
HEMATOCRIT: 27.5 % — AB (ref 36.0–46.0)
Hemoglobin: 9.3 g/dL — ABNORMAL LOW (ref 12.0–15.0)
MCH: 27.4 pg (ref 26.0–34.0)
MCHC: 33.8 g/dL (ref 30.0–36.0)
MCV: 81.1 fL (ref 78.0–100.0)
PLATELETS: 349 10*3/uL (ref 150–400)
RBC: 3.39 MIL/uL — ABNORMAL LOW (ref 3.87–5.11)
RDW: 15.3 % (ref 11.5–15.5)
WBC: 12.6 10*3/uL — ABNORMAL HIGH (ref 4.0–10.5)

## 2015-03-13 LAB — VANCOMYCIN, TROUGH: VANCOMYCIN TR: 19 ug/mL (ref 10.0–20.0)

## 2015-03-13 LAB — HEPARIN LEVEL (UNFRACTIONATED): Heparin Unfractionated: 0.41 IU/mL (ref 0.30–0.70)

## 2015-03-13 MED ORDER — APIXABAN 5 MG PO TABS
5.0000 mg | ORAL_TABLET | Freq: Two times a day (BID) | ORAL | Status: DC
  Administered 2015-03-13 – 2015-03-14 (×3): 5 mg via ORAL
  Filled 2015-03-13 (×3): qty 1

## 2015-03-13 MED ORDER — POTASSIUM CHLORIDE CRYS ER 20 MEQ PO TBCR
40.0000 meq | EXTENDED_RELEASE_TABLET | Freq: Once | ORAL | Status: AC
  Administered 2015-03-13: 40 meq via ORAL
  Filled 2015-03-13: qty 2

## 2015-03-13 NOTE — Progress Notes (Addendum)
[place] for Heparin   Indication: atrial fibrillation  Allergies  Allergen Reactions  . Sulfamethoxazole Rash    Patient Measurements: Height: 5\' 3"  (160 cm) Weight: 156 lb 1.6 oz (70.806 kg) IBW/kg (Calculated) : 52.4 HEPARIN DW (KG): 67.9   Vital Signs: Temp: 98.1 F (36.7 C) (12/12 0450) Temp Source: Oral (12/12 0450) BP: 146/70 mmHg (12/12 0450) Pulse Rate: 67 (12/12 0450)  Labs:  Recent Labs  03/11/15 0709  03/12/15 0659 03/12/15 1604 03/13/15 0628  HGB 7.9*  --  9.9*  --  9.3*  HCT 23.0*  --  29.6*  --  27.5*  PLT 301  --  338  --  349  HEPARINUNFRC <0.10*  < > 0.12* 0.17* 0.41  CREATININE 0.81  --  0.78  --  0.81  < > = values in this interval not displayed.  Estimated Creatinine Clearance: 54 mL/min (by C-G formula based on Cr of 0.81).   Medical History: Past Medical History  Diagnosis Date  . Depressive disorder, not elsewhere classified   . Anxiety state, unspecified   . Pure hypercholesterolemia   . Esophageal reflux   . 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 Congestive heart failure, unspecified     diastolic heart failure  . Pulmonary hypertension (HCC)     PA systolic pressure [id number] mmHg by echocardiogram, PA pressure 33/10 by cardiac catheterization PA saturation 62% thermodilution cardiac index 2.0 thick cardiac index 2.4  . Bradycardia     previously with coreg  . Tricuspid regurgitation     moderate tricuspid regurgitation by echocardiogram, prior use of anorexic agents  . Melanoma ([place]) 1995    removal on back   . Coronary artery disease     a. Moderate to severe coronary disease involving the left anterior descending artery and right coronary artery.  Sequential stenosis in the LAD is significant.  Right coronary artery is moderate to severely likely nonischemic. Questionable small LVOT obstruction.  No Brockenbrough maneuver was performed.  Catheterization January 2013; b. 05/2014 MV no  isch/infarct, EF 60%.  Marland Kitchen PONV (postoperative nausea and vomiting)   . Hypertension   . H/O hiatal hernia   . Gastritis   . [name]'s esophagus   . IBS (irritable bowel syndrome)   . Macular degeneration   . Dysrhythmia    Medications:  Scheduled:  . amLODipine  5 mg Oral Daily  . feeding supplement (ENSURE ENLIVE)  237 mL Oral BID BM  . guaiFENesin  1,200 mg Oral BID  . losartan  100 mg Oral Daily  . metoprolol tartrate  25 mg Oral BID  . metronidazole  500 mg Intravenous Q8H  . pantoprazole  40 mg Oral Q1200  . piperacillin-tazobactam (ZOSYN)  IV  3.375 g Intravenous Q8H  . potassium & sodium phosphates  1 packet Oral TID WC & HS  . potassium chloride  40 mEq Oral Once  . sodium chloride  10-40 mL Intracatheter Q12H  . sodium chloride  3 mL Intravenous Q12H    Assessment: s/p Colectomy on 12/04. New Afib with chadsvasc score of 6.  Heparin level this am is therapeutic.  No bleeding noted.  Note plans to change back to eliquis Goal of Therapy:  Heparin level 0.3-0.7 units/ml Monitor platelets by anticoagulation protocol: Yes   Plan:  Cont  heparin drip at 1400 units/hr Daily CBC and heparin level while on heparin  Thanks for allowing pharmacy to be a part of this patient's care.  Excell Seltzer, PharmD Clinical Pharmacist  03/13/2015,11:13 AM  Addum:  Resume eliquis 5 mg po bid.

## 2015-03-13 NOTE — Discharge Instructions (Signed)
Apixaban oral tablets °What is this medicine? °APIXABAN (a PIX a ban) is an anticoagulant (blood thinner). It is used to lower the chance of stroke in people with a medical condition called atrial fibrillation. It is also used to treat or prevent blood clots in the lungs or in the veins. °This medicine may be used for other purposes; ask your health care provider or pharmacist if you have questions. °What should I tell my health care provider before I take this medicine? °They need to know if you have any of these conditions: °-bleeding disorders °-bleeding in the brain °-blood in your stools (black or tarry stools) or if you have blood in your vomit °-history of stomach bleeding °-kidney disease °-liver disease °-mechanical heart valve °-an unusual or allergic reaction to apixaban, other medicines, foods, dyes, or preservatives °-pregnant or trying to get pregnant °-breast-feeding °How should I use this medicine? °Take this medicine by mouth with a glass of water. Follow the directions on the prescription label. You can take it with or without food. If it upsets your stomach, take it with food. Take your medicine at regular intervals. Do not take it more often than directed. Do not stop taking except on your doctor's advice. Stopping this medicine may increase your risk of a blot clot. Be sure to refill your prescription before you run out of medicine. °Talk to your pediatrician regarding the use of this medicine in children. Special care may be needed. °Overdosage: If you think you have taken too much of this medicine contact a poison control center or emergency room at once. °NOTE: This medicine is only for you. Do not share this medicine with others. °What if I miss a dose? °If you miss a dose, take it as soon as you can. If it is almost time for your next dose, take only that dose. Do not take double or extra doses. °What may interact with this medicine? °This medicine may interact with the following: °-aspirin  and aspirin-like medicines °-certain medicines for fungal infections like ketoconazole and itraconazole °-certain medicines for seizures like carbamazepine and phenytoin °-certain medicines that treat or prevent blood clots like warfarin, enoxaparin, and dalteparin °-clarithromycin °-NSAIDs, medicines for pain and inflammation, like ibuprofen or naproxen °-rifampin °-ritonavir °-St. John's wort °This list may not describe all possible interactions. Give your health care provider a list of all the medicines, herbs, non-prescription drugs, or dietary supplements you use. Also tell them if you smoke, drink alcohol, or use illegal drugs. Some items may interact with your medicine. °What should I watch for while using this medicine? °Notify your doctor or health care professional and seek emergency treatment if you develop breathing problems; changes in vision; chest pain; severe, sudden headache; pain, swelling, warmth in the leg; trouble speaking; sudden numbness or weakness of the face, arm, or leg. These can be signs that your condition has gotten worse. °If you are going to have surgery, tell your doctor or health care professional that you are taking this medicine. °Tell your health care professional that you use this medicine before you have a spinal or epidural procedure. Sometimes people who take this medicine have bleeding problems around the spine when they have a spinal or epidural procedure. This bleeding is very rare. If you have a spinal or epidural procedure while on this medicine, call your health care professional immediately if you have back pain, numbness or tingling (especially in your legs and feet), muscle weakness, paralysis, or loss of bladder or bowel   control. °Avoid sports and activities that might cause injury while you are using this medicine. Severe falls or injuries can cause unseen bleeding. Be careful when using sharp tools or knives. Consider using an electric razor. Take special care  brushing or flossing your teeth. Report any injuries, bruising, or red spots on the skin to your doctor or health care professional. °What side effects may I notice from receiving this medicine? °Side effects that you should report to your doctor or health care professional as soon as possible: °-allergic reactions like skin rash, itching or hives, swelling of the face, lips, or tongue °-signs and symptoms of bleeding such as bloody or black, tarry stools; red or dark-brown urine; spitting up blood or brown material that looks like coffee grounds; red spots on the skin; unusual bruising or bleeding from the eye, gums, or nose °This list may not describe all possible side effects. Call your doctor for medical advice about side effects. You may report side effects to FDA at 1-800-FDA-1088. °Where should I keep my medicine? °Keep out of the reach of children. °Store at room temperature between 20 and 25 degrees C (68 and 77 degrees F). Throw away any unused medicine after the expiration date. °NOTE: This sheet is a summary. It may not cover all possible information. If you have questions about this medicine, talk to your doctor, pharmacist, or health care provider. °  °© 2016, [employer]. (2012-11-20 11:59:24) ° °

## 2015-03-13 NOTE — Clinical Social Work Placement (Signed)
   CLINICAL SOCIAL WORK PLACEMENT  NOTE  Date:  03/13/2015  Patient Details  Name: [name] MRN: [id number] Date of Birth: 1934-07-23  Clinical Social Work is seeking post-discharge placement for this patient at the [place] level of care (*CSW will initial, date and re-position this form in  chart as items are completed):  Yes   Patient/family provided with [place] Work Department's list of facilities offering this level of care within the geographic area requested by the patient (or if unable, by the patient's family).  Yes   Patient/family informed of their freedom to choose among providers that offer the needed level of care, that participate in Medicare, Medicaid or managed care program needed by the patient, have an available bed and are willing to accept the patient.  Yes   Patient/family informed of [facility]'s ownership interest in [facility] and [facility], as well as of the fact that they are under no obligation to receive care at these facilities.  PASRR submitted to EDS on 03/09/15     PASRR number received on 03/09/15     Existing PASRR number confirmed on       FL2 transmitted to all facilities in geographic area requested by pt/family on       FL2 transmitted to all facilities within larger geographic area on       Patient informed that his/her managed care company has contracts with or will negotiate with certain facilities, including the following:        Yes   Patient/family informed of bed offers received.  Patient chooses bed at [facility]     Physician recommends and patient chooses bed at      Patient to be transferred to [facility] on  .  Patient to be transferred to facility by       Patient family notified on   of transfer.  Name of family member notified:        PHYSICIAN       Additional Comment:   73 day pasarr. Facility aware.   _______________________________________________ [name], [place] 03/13/2015, 10:54 AM [phone]

## 2015-03-13 NOTE — Progress Notes (Signed)
Patient Demographics  [name], is a 80 y.o. female, DOB - 07-03-1934, [id number]  Admit date - 03/02/2015   Admitting Physician Phillips Grout, MD  Outpatient Primary MD for the patient is [name]  LOS - 10   Chief Complaint  Patient presents with  . Abdominal Pain       Admission HPI/Brief narrative: 52 yow presented with complaints of LLQ abdominal pain with associated nausea. CT scan reveal perforated bowel. General surgery preformed partial colectomy 120/04 with no immediate complications. Her overall hospital course has been complicated by development of Afib. She has been started on rate control and since converted back to SR. Her CHADSVASc score is 6, cardiology consulted, and recommended to continue patient on anticoagulation. Repeat CT abdomen with questionable abscess, Dr. Arnoldo Morale discussed with interventional radiology, these findings more consistent with postoperative changes.  Subjective:   [name] today has, No headache, No chest pain, No new weakness tingling or numbness, No Cough - SOB, reports abdominal pain controlled, tolerating soft diet.  Assessment & Plan    Principal Problem:   Perforated sigmoid colon ([place]) Active Problems:   HYPERCHOLESTEROLEMIA   Anxiety state   DIASTOLIC DYSFUNCTION   GERD   HTN (hypertension)   Coronary artery disease   Abdominal pain   Lactic acidosis   Prolonged Q-T interval on ECG   CKD (chronic kidney disease) stage 3, GFR 30-59 ml/min   AKI (acute kidney injury) ([place])   Atrial flutter ([place])   perforated sigmoid colon  - As evident on CT abdomen pelvis on 12/2 . - Surgical consult greatly appreciated, status post partial colectomy with colostomy/Hartmann procedure  on 03/05/2015, pathology significant for DIVERTICULITIS WITH ABSCESS, PERFORATION AND ASSOCIATED ACUTE SEROSITIS. - Management per surgery, pain is well  controlled, tolerating soft diet. - Repeat CT abdomen pelvis 12/9 done secondary to worsening leukocytosis, initial findings suggestive of abscess, Dr. Arnoldo Morale discussed with interventional radiology, most of these findings are postoperative, or indeterminant, no indication for percutaneous drain currently, may need repeat CT abdomen and future if no improvement.  . -  JP drain still putting out dark cloudy material ,so will continue with IV Zosyn, IV Flagyl added on 12/8 by surgery, leukocytosis is improving, if continues to improve, plan to discharge tomorrow on oral Augmentin.  Leukocytosis - Please see above discussion. -  as well  discontinued patient left subclavian TLC 12/9 ,  started empirically on IV vancomycin, blood cultures no growth to date so far, so will discontinue IV vancomycin as blood cultures remain negative.  Finding of mass in ascending colon - This will need to be further worked up and patient is more stable from surgical point of view.   paroxysmal A. fib with RVR - Postoperative, currently converted to normal sinus rhythm, off Cardizem drip, started on metoprolol.  - CHADSVASC=6  heparin GTT, will transition to Eliquis..  Acute on chronic kidney  stage III  - Likely related to IV contrast, resolved with IV fluids   Anemia - hemoglobin of 7.9 /10, transfused 1 unit PRBC 12/10, glycohemoglobin is 9.3 today .  Hypokalemia/hypophosphatemia  - Replete as needed  GERD  - Continue with PPI   Coronary artery disease  - Continue with aspirin  and statin  Hypertension  - Better controlled after starting amlodipine, continue with metoprolol and losartan.   hyperlipidemia - Continue with statin  Code Status: full  Family Communication: discussed with patient  Disposition Plan: SNF in 24 hours if continues to improve   Procedures  - partial colectomy with colostomy/Hartmann procedure  on 03/05/2015 - PICC line 12/9 - Discontinued left subclavian TLC 12/9 - 1  unit PRBC 12/10   Consults   Gen. surgery   cardiology    Medications  Scheduled Meds: . amLODipine  5 mg Oral Daily  . feeding supplement (ENSURE ENLIVE)  237 mL Oral BID BM  . guaiFENesin  1,200 mg Oral BID  . losartan  100 mg Oral Daily  . metoprolol tartrate  25 mg Oral BID  . metronidazole  500 mg Intravenous Q8H  . pantoprazole  40 mg Oral Q1200  . piperacillin-tazobactam (ZOSYN)  IV  3.375 g Intravenous Q8H  . potassium & sodium phosphates  1 packet Oral TID WC & HS  . potassium chloride  40 mEq Oral Once  . sodium chloride  10-40 mL Intracatheter Q12H  . sodium chloride  3 mL Intravenous Q12H  . vancomycin  750 mg Intravenous Q12H   Continuous Infusions: . sodium chloride 75 mL/hr at 03/13/15 0628  . heparin 1,400 Units/hr (03/12/15 1655)   PRN Meds:.acetaminophen, ALPRAZolam, alum & mag hydroxide-simeth, hydrALAZINE, HYDROmorphone (DILAUDID) injection, magnesium hydroxide, ondansetron **OR** ondansetron (ZOFRAN) IV, simethicone, sodium chloride  DVT Prophylaxis  Heparin GTT  Lab Results  Component Value Date   PLT 349 03/13/2015    Antibiotics    Anti-infectives    Start     Dose/Rate Route Frequency Ordered Stop   03/11/15 0900  vancomycin (VANCOCIN) IVPB 750 mg/150 ml premix     750 mg 150 mL/hr over 60 Minutes Intravenous Every 12 hours 03/11/15 0850     03/10/15 1015  vancomycin (VANCOCIN) 1,500 mg in sodium chloride 0.9 % 500 mL IVPB     1,500 mg 250 mL/hr over 120 Minutes Intravenous  Once 03/10/15 1013 03/10/15 1252   03/09/15 0930  metroNIDAZOLE (FLAGYL) IVPB 500 mg     500 mg 100 mL/hr over 60 Minutes Intravenous Every 8 hours 03/09/15 0919     03/03/15 0900  piperacillin-tazobactam (ZOSYN) IVPB 3.375 g     3.375 g 12.5 mL/hr over 240 Minutes Intravenous Every 8 hours 03/03/15 0739     03/03/15 0100  piperacillin-tazobactam (ZOSYN) IVPB 3.375 g     3.375 g 100 mL/hr over 30 Minutes Intravenous  Once 03/03/15 0049 03/03/15 0121           Objective:   Filed Vitals:   03/12/15 0539 03/12/15 1512 03/12/15 2244 03/13/15 0450  BP:  146/74 150/70 146/70  Pulse:  68 67 67  Temp:  98 F (36.7 C) 98.3 F (36.8 C) 98.1 F (36.7 C)  TempSrc:  Oral Oral Oral  Resp:  16 17 18   Height:      Weight: 72.439 kg (159 lb 11.2 oz)   70.806 kg (156 lb 1.6 oz)  SpO2:  96% 99% 100%    Wt Readings from Last 3 Encounters:  03/13/15 70.806 kg (156 lb 1.6 oz)  02/14/15 67.042 kg (147 lb 12.8 oz)  02/10/15 67.132 kg (148 lb)     Intake/Output Summary (Last 24 hours) at 03/13/15 1057 Last data filed at 03/13/15 0630  Gross per 24 hour  Intake   1380 ml  Output  0 ml  Net   1380 ml     Physical Exam  General: NAD. Appears calm and looks comfortable lying in bed.   Cardiovascular: Irregular   Respiratory: clear bilaterally, No wheezing, rales or rhonchi  Abdomen: soft, no distention, Colostomy noted, midline surgical scar with no bleed or discharge, JP drain right lower abdomen  Musculoskeletal: No edema b/l  Psychiatric: Oriented to self, place, and reason for being in the hospital.    Data Review   Micro Results Recent Results (from the past 240 hour(s))  MRSA PCR Screening     Status: None   Collection Time: 03/04/15  1:43 PM  Result Value Ref Range Status   MRSA by PCR NEGATIVE NEGATIVE Final    Comment:        The GeneXpert MRSA Assay (FDA approved for NASAL specimens only), is one component of a comprehensive MRSA colonization surveillance program. It is not intended to diagnose MRSA infection nor to guide or monitor treatment for MRSA infections.   Culture, blood (routine x 2)     Status: None (Preliminary result)   Collection Time: 03/10/15  9:12 AM  Result Value Ref Range Status   Specimen Description RIGHT ANTECUBITAL  Final   Special Requests BOTTLES DRAWN AEROBIC AND ANAEROBIC 12CC EACH  Final   Culture NO GROWTH 3 DAYS  Final   Report Status PENDING  Incomplete  Culture, blood  (routine x 2)     Status: None (Preliminary result)   Collection Time: 03/10/15  9:18 AM  Result Value Ref Range Status   Specimen Description ARM  Final   Special Requests BOTTLES DRAWN AEROBIC AND ANAEROBIC PICC 6 CC EACH  Final   Culture NO GROWTH 3 DAYS  Final   Report Status PENDING  Incomplete    Radiology Reports Ct Abdomen Pelvis W Contrast  03/11/2015  ADDENDUM REPORT: 03/11/2015 08:44 ADDENDUM: Findings and [place] reviewed with Dr. Arnoldo Morale. Left hemipelvis and posterior sacral retroperitoneal collection is mostly air. No significant fluid component for drain catheter. Second Left lower quadrant small fluid collection with a single focus of air, image 64 extends slightly midline over the bladder. This remains indeterminate. White count has decreased from from 18 to 15. Patient is only 6 days postop. Will hold on percutaneous drainage and consider repeat CT next week. Electronically Signed   By: Jerilynn Mages.  Shick M.D.   On: 03/11/2015 08:44  03/11/2015  CLINICAL DATA:  80 year old who underwent sigmoid colectomy and descending colostomy related to colonic perforation on 03/05/2015, now with worsening leukocytosis. EXAM: CT ABDOMEN AND PELVIS WITH CONTRAST TECHNIQUE: Multidetector CT imaging of the abdomen and pelvis was performed using the standard protocol following bolus administration of intravenous contrast. CONTRAST:  173mL OMNIPAQUE IOHEXOL 300 MG/ML IV. COMPARISON:  Preoperative CT abdomen and pelvis 03/03/2015, 02/10/2015. FINDINGS: Lower chest: Small to moderate-sized right pleural effusion and associated minimal passive atelectasis in the right lower lobe. Small left pleural effusion. Chronic elevation of the left hemidiaphragm with chronic scar/atelectasis in the left lower lobe. Heart moderately enlarged, particularly the left atrium. Hepatobiliary: Liver normal in size and appearance. Gallbladder unremarkable by CT. No biliary ductal dilation. Pancreas: Severe diffuse atrophy. No focal  pancreatic parenchymal abnormality. Spleen: Normal in size and appearance. Surgical clips again noted adjacent to the medial spleen Adrenals/Urinary Tract: Normal appearing adrenal glands. Mildly atrophic left kidney with diffuse cortical thinning. Nonobstructing approximate 5 mm calculus in a lower pole calix of the left kidney. No right urinary tract calculi. Cortical cysts  involving both kidneys, the largest arising from the lower pole of the left kidney approximating 3.5 cm. No significant focal abnormality involving either kidney. Urinary bladder contains gas, presumably related to recent instrumentation, and is otherwise normal in appearance. Stomach/Bowel: Surgical suture material involving the stomach related to prior Nissen fundoplication. Recurrent very large hiatal hernia with at least 2/3 of the stomach present in the chest, unchanged from the prior examinations. Distended loops of jejunum in the left upper quadrant of the abdomen, with normal caliber loops of distal jejunum and ileum. Cecum decompressed which I believe accounts for apparent wall thickening, positioned anteriorly in the upper pelvis. Approximate 2.6 cm mass in the ascending colon, obscured by stool on the preoperative CT. Satisfactory appearance of the descending colostomy. Vascular/Lymphatic: Severe atherosclerosis involving the abdominal aorta and moderate iliofemoral atherosclerosis without aneurysm. No pathologic lymphadenopathy. Reproductive: Atrophic uterus consistent with age. No adnexal masses. Other: Adjacent fluid collections with enhancing wall, containing gas, in the right side of the mid and lower pelvis. The fluid collection more superiorly has adjacent phlegmonous change and residual extraluminal gas. In total, the phlegmon and fluid collection measure approximately 4.2 x 6.6 x 4.5 cm. The fluid collection itself which contains a large amount of gas approximates 3.3 cm maximally. The fluid collection more inferiorly in the  pelvis is oval-shaped and extends into the low pelvis just above the urinary bladder. The collection is difficult accurately measure, though extends over an approximate 9-10 cm superoinferior length. Phlegmonous change with associated extraluminal gas is present in the left side of the low pelvis extending into the presacral space, extending over an approximate 11 cm length. A surgical drain is in place in the low pelvis. Musculoskeletal: Degenerative disc disease at L4-5 and L5-S1. Generalized osseous demineralization. Lower thoracic spondylosis. IMPRESSION: 1. At least 3 abscesses in the left side of the pelvis as described above. Two of these collections have predominantly phlegmonous change with extraluminal gas. The largest fluid collection spans approximately 11 cm in length and would be amenable to percutaneous drainage. The other 2 predominantly phlegmonous collections would likely not be well treated by percutaneous drainage at this point. 2. Approximate 2.6 cm mass involving the ascending colon, best seen on the coronal reformatted images, worrisome for malignancy. This lesion was obscured by stool on the preoperative CT. 3. Dilated loops of jejunum in the left upper quadrant, more likely to represent postoperative ileus than obstruction, though followup abdominal x-rays may be helpful. 4. Bilateral pleural effusions, right greater than left, with associated mild passive atelectasis in the right lower lobe. 5. Gas within the urinary bladder, presumably related to recent instrumentation. If the patient has not had a Foley catheter placed recently, an enteric-vesical fistula is possible. 6. Numerous chronic findings including large hiatal hernia, severe pancreatic atrophy, mildly atrophic left kidney with a nonobstructing 5 mm lower pole calculus. Electronically Signed: By: Evangeline Dakin M.D. On: 03/10/2015 21:08   Ct Abdomen Pelvis W Contrast  03/03/2015  CLINICAL DATA:  Sudden onset abdominal pain in  the left lower quadrant at 17:00. EXAM: CT ABDOMEN AND PELVIS WITH CONTRAST TECHNIQUE: Multidetector CT imaging of the abdomen and pelvis was performed using the standard protocol following bolus administration of intravenous contrast. CONTRAST:  151mL OMNIPAQUE IOHEXOL 300 MG/ML  SOLN COMPARISON:  02/10/2015 FINDINGS: There is a large volume of extraluminal air, predominantly retroperitoneal. The extraluminal air probably originates from a perforation of the mid sigmoid colon. There is no bowel obstruction. No abscess or drainable fluid collection  is evident. Large hiatal hernia again noted. There are unremarkable appearances of the liver, spleen, pancreas, adrenals and kidneys except for a few benign renal cysts. Uterus and ovaries are unremarkable. Abdominal aorta is normal in caliber with moderate atherosclerotic calcification. No significant abnormalities evident in the lower chest. No significant musculoskeletal lesion is evident. IMPRESSION: Large volume extraluminal air, predominantly retroperitoneal, probably originating from a perforation of the mid sigmoid colon. Critical Value/emergent results were called by telephone at the time of interpretation on 03/03/2015 at 12:45 am to Dr. Tomi Bamberger , who verbally acknowledged these results. Electronically Signed   By: Andreas Newport M.D.   On: 03/03/2015 00:49   Dg Chest Port 1 View  03/10/2015  CLINICAL DATA:  PICC line placement EXAM: PORTABLE CHEST 1 VIEW COMPARISON:  Radiograph 03/05/2015 FINDINGS: Interval placement of a RIGHT PICC line which tip deflects into the brachiocephalic vein. LEFT central venous line with tip in the proximal brachiocephalic vein. No pneumothorax. Improvement in RIGHT lower lobe atelectasis. IMPRESSION: 1. RIGHT PICC line with tip deflected into the distal aspect the brachiocephalic vein. 2. Improvement in RIGHT lower lobe atelectasis. Electronically Signed   By: Suzy Bouchard M.D.   On: 03/10/2015 14:27   Dg Chest Port 1  View  03/05/2015  CLINICAL DATA:  Central line placement. EXAM: PORTABLE CHEST 1 VIEW COMPARISON:  03/04/2015 and 03/02/2015. FINDINGS: 1304 hours. New left subclavian central venous catheter projected laterally at the mid SVC level near the origin of the azygos vein. The heart size and mediastinal contours are stable with a known hiatal hernia. There is no pneumothorax. Medial right basilar airspace disease appears unchanged. The left lung is clear. IMPRESSION: Central venous catheter tip at the mid SVC level near the origin of the azygos vein. No pneumothorax. No significant change in medial right basilar airspace disease. Electronically Signed   By: Richardean Sale M.D.   On: 03/05/2015 14:15   Dg Chest Port 1 View  03/04/2015  CLINICAL DATA:  Hypoxia EXAM: PORTABLE CHEST 1 VIEW COMPARISON:  Two days ago FINDINGS: There is streaky right infrahilar lung opacity which is newly developed. No edema, effusion, or pneumothorax. Lung volumes are low. Chronic cardiomegaly with superimposed large hiatal hernia. IMPRESSION: New right middle lobe atelectasis or consolidation. Given gas-filled hiatal hernia, question underlying aspiration. Electronically Signed   By: Monte Fantasia M.D.   On: 03/04/2015 15:43   Dg Chest Port 1v Same Day  03/10/2015  CLINICAL DATA:  PICC line placement. Congestive heart failure and coronary artery disease. EXAM: PORTABLE CHEST 1 VIEW COMPARISON:  03/10/2015 FINDINGS: The right arm PICC line has been repositioned, with tip now all in the mid to distal SVC. Left subclavian central venous catheter tip overlies the left innominate vein is unchanged in position. No evidence of pneumothorax or pleural effusion. Heart size is stable. Both lungs are clear. Moderate to large hiatal hernia again noted. IMPRESSION: Right arm PICC line now in appropriate position. No active lung disease.  Hiatal hernia. Electronically Signed   By: Earle Gell M.D.   On: 03/10/2015 17:22   Dg Chest Port 1v Same  Day  03/10/2015  CLINICAL DATA:  Re- ingested PICC EXAM: PORTABLE CHEST 1 VIEW COMPARISON:  Earlier today FINDINGS: Unchanged appearance of the right upper extremity PICC with tip in the distal left brachiocephalic vein. Unchanged left-sided PICC, tip at the left brachiocephalic vein. Chronic cardiomegaly. Right lower mediastinal contour abnormality correlating with large hiatal hernia. There is no edema, consolidation, effusion, or pneumothorax.  IMPRESSION: Unchanged positioning of bilateral upper extremity PICCs, with both tips at the left brachiocephalic vein. Electronically Signed   By: Monte Fantasia M.D.   On: 03/10/2015 15:14   Dg Abd Acute W/chest  03/02/2015  CLINICAL DATA:  Left lower quadrant pain. Bloody diarrhea. Shortness of breath. EXAM: DG ABDOMEN ACUTE W/ 1V CHEST COMPARISON:  None. FINDINGS: There is no evidence of dilated bowel loops or free intraperitoneal air. Large amount of stool throughout the colon. No radiopaque calculi or other significant radiographic abnormality is seen. Heart size and mediastinal contours are within normal limits. Both lungs are clear. IMPRESSION: Large amount of stool throughout the colon. No acute cardiopulmonary disease. Electronically Signed   By: Kathreen Devoid   On: 03/02/2015 23:15   Dg Duanne Limerick W/high Density W/kub  02/17/2015  CLINICAL DATA:  Patient status post partial reduction of esophageal large hiatal hernia. Patient has intermittent RIGHT-sided pain and vomiting. EXAM: UPPER GI SERIES WITH KUB TECHNIQUE: After obtaining a scout radiograph a routine upper GI series was performed using high-density FLUOROSCOPY TIME:  Radiation Exposure Index (as provided by the fluoroscopic device): If the device does not provide the exposure index: Fluoroscopy Time (in minutes and seconds):  1 minutes 48 seconds Number of Acquired Images:  15 COMPARISON:  CT 02/10/2015, upper GI 09/27/2014 FINDINGS: Initial KUB demonstrates surgical clips in the gastric cardia region.  Administration of contrast demonstrate no stricture or mass within the esophagus. Contrast flowed into the stomach. There is a large hiatal hernia present with approximately 60% of the stomach above the diaphragm. No retained food stuff within the stomach. The herniated stomach is within the the central and RIGHT side of the mediastinum. The herniated stomach is patulous and retains barium but the barium eventually flows through the diaphragmatic hiatus into the gastric antrum. Ultimately contrast flows into the C-loop of the duodenum and ligament Treitz in the LEFT upper quadrant. There is some retention of contrast within the stomach at the end of the study. IMPRESSION: 1. Large sliding-type hiatal hernia which is patulous and retains oral contrast. 2. No evidence of high-grade obstruction with contrast flowing into the proximal small bowel. 3. No evidence of esophageal obstruction. 4. No retained food stuff within the stomach at beginning of study. Electronically Signed   By: Suzy Bouchard M.D.   On: 02/17/2015 12:05     CBC  Recent Labs Lab 03/09/15 0455 03/10/15 0813 03/11/15 0709 03/12/15 0659 03/13/15 0628  WBC 17.8* 18.8* 15.3* 14.2* 12.6*  HGB 8.9* 8.8* 7.9* 9.9* 9.3*  HCT 27.1* 26.5* 23.0* 29.6* 27.5*  PLT 225 299 301 338 349  MCV 79.2 79.6 78.8 80.4 81.1  MCH 26.0 26.4 27.1 26.9 27.4  MCHC 32.8 33.2 34.3 33.4 33.8  RDW 15.0 14.7 14.8 15.0 15.3    Chemistries   Recent Labs Lab 03/07/15 0420 03/08/15 0333 03/09/15 0455 03/10/15 0813 03/11/15 0709 03/12/15 0659 03/13/15 0628  NA 135 132* 133* 134* 132* 134* 135  K 4.3 4.2 3.7 4.1 3.4* 3.7 3.5  CL 107 104 101 101 101 105 106  CO2 23 23 24 25 22 22 23   GLUCOSE 122* 112* 83 110* 93 98 103*  BUN 14 11 13 11 8 7 6   CREATININE 0.79 0.77 0.77 0.96 0.81 0.78 0.81  CALCIUM 7.6* 7.7* 7.8* 7.9* 7.3* 7.6* 7.6*  MG 1.9 1.7 1.7  --   --  1.8  --   AST 22  --   --   --   --   --   --  ALT 16  --   --   --   --   --   --    ALKPHOS 64  --   --   --   --   --   --   BILITOT 0.5  --   --   --   --   --   --    ------------------------------------------------------------------------------------------------------------------ estimated creatinine clearance is 54 mL/min (by C-G formula based on Cr of 0.81). ------------------------------------------------------------------------------------------------------------------ No results for input(s): HGBA1C in the last 72 hours. ------------------------------------------------------------------------------------------------------------------ No results for input(s): CHOL, HDL, LDLCALC, TRIG, CHOLHDL, LDLDIRECT in the last 72 hours. ------------------------------------------------------------------------------------------------------------------ No results for input(s): TSH, T4TOTAL, T3FREE, THYROIDAB in the last 72 hours.  Invalid input(s): FREET3 ------------------------------------------------------------------------------------------------------------------ No results for input(s): VITAMINB12, FOLATE, FERRITIN, TIBC, IRON, RETICCTPCT in the last 72 hours.  Coagulation profile No results for input(s): INR, PROTIME in the last 168 hours.  No results for input(s): DDIMER in the last 72 hours.  Cardiac Enzymes No results for input(s): CKMB, TROPONINI, MYOGLOBIN in the last 168 hours.  Invalid input(s): CK ------------------------------------------------------------------------------------------------------------------ Invalid input(s): POCBNP     Time Spent in minutes   30 minutes   Winda Summerall M.D on 03/13/2015 at 10:57 AM  Between 7am to 7pm - Pager - [phone]  After 7pm go to [email or web address] - password [facility]  [facility]   Office  [phone]

## 2015-03-13 NOTE — Progress Notes (Signed)
8 Days Post-Op  Subjective: Patient has no complaints.  Objective: Vital signs in last 24 hours: Temp:  [98 F (36.7 C)-98.3 F (36.8 C)] 98.1 F (36.7 C) (12/12 0450) Pulse Rate:  [67-68] 67 (12/12 0450) Resp:  [16-18] 18 (12/12 0450) BP: (146-150)/(70-74) 146/70 mmHg (12/12 0450) SpO2:  [96 %-100 %] 100 % (12/12 0450) Weight:  [70.806 kg (156 lb 1.6 oz)] 70.806 kg (156 lb 1.6 oz) (12/12 0450) Last BM Date: 03/12/15  Intake/Output from previous day: 12/11 0701 - 12/12 0700 In: 1500 [P.O.:600; I.V.:800; IV Piggyback:100] Out: -  Intake/Output this shift:    General appearance: alert, cooperative, appears stated age and no distress GI: Soft, incision healing well. Ostomy pink and patent. JP drainage decreased.  Lab Results:   Recent Labs  03/12/15 0659 03/13/15 0628  WBC 14.2* 12.6*  HGB 9.9* 9.3*  HCT 29.6* 27.5*  PLT 338 349   BMET  Recent Labs  03/12/15 0659 03/13/15 0628  NA 134* 135  K 3.7 3.5  CL 105 106  CO2 22 23  GLUCOSE 98 103*  BUN 7 6  CREATININE 0.78 0.81  CALCIUM 7.6* 7.6*   PT/INR No results for input(s): LABPROT, INR in the last 72 hours.  Studies/Results: No results found.  Anti-infectives: Anti-infectives    Start     Dose/Rate Route Frequency Ordered Stop   03/11/15 0900  vancomycin (VANCOCIN) IVPB 750 mg/150 ml premix     750 mg 150 mL/hr over 60 Minutes Intravenous Every 12 hours 03/11/15 0850     03/10/15 1015  vancomycin (VANCOCIN) 1,500 mg in sodium chloride 0.9 % 500 mL IVPB     1,500 mg 250 mL/hr over 120 Minutes Intravenous  Once 03/10/15 1013 03/10/15 1252   03/09/15 0930  metroNIDAZOLE (FLAGYL) IVPB 500 mg     500 mg 100 mL/hr over 60 Minutes Intravenous Every 8 hours 03/09/15 0919     03/03/15 0900  piperacillin-tazobactam (ZOSYN) IVPB 3.375 g     3.375 g 12.5 mL/hr over 240 Minutes Intravenous Every 8 hours 03/03/15 0739     03/03/15 0100  piperacillin-tazobactam (ZOSYN) IVPB 3.375 g     3.375 g 100 mL/hr over  30 Minutes Intravenous  Once 03/03/15 0049 03/03/15 0121      Assessment/Plan: s/p Procedure(s): PARTIAL COLECTOMY WITH COLOSTOMY CREATION/HARTMANN PROCEDURE CENTRAL LINE INSERTION Impression: Patient's leukocytosis is resolving. No need for further surgical or radiologic intervention at this time. May be switched to oral anticoagulant. Okay for transfer to skilled nursing unit for further treatment. Would switch to Augmentin by mouth for another week.  LOS: 10 days    Javier Gell A 03/13/2015

## 2015-03-14 ENCOUNTER — Inpatient Hospital Stay
Admission: RE | Admit: 2015-03-14 | Discharge: 2015-03-28 | Disposition: A | Discharge status: Home / Self Care | Payer: Medicare Other | Source: Ambulatory Visit | Attending: Internal Medicine | Admitting: Internal Medicine

## 2015-03-14 LAB — CBC
HEMATOCRIT: 26.9 % — AB (ref 36.0–46.0)
HEMOGLOBIN: 8.9 g/dL — AB (ref 12.0–15.0)
MCH: 27.1 pg (ref 26.0–34.0)
MCHC: 33.1 g/dL (ref 30.0–36.0)
MCV: 81.8 fL (ref 78.0–100.0)
Platelets: 359 10*3/uL (ref 150–400)
RBC: 3.29 MIL/uL — AB (ref 3.87–5.11)
RDW: 15.8 % — ABNORMAL HIGH (ref 11.5–15.5)
WBC: 9.4 10*3/uL (ref 4.0–10.5)

## 2015-03-14 MED ORDER — METOPROLOL TARTRATE 25 MG PO TABS
25.0000 mg | ORAL_TABLET | Freq: Two times a day (BID) | ORAL | Status: DC
Start: 2015-03-14 — End: 2015-08-09

## 2015-03-14 MED ORDER — AMOXICILLIN-POT CLAVULANATE 875-125 MG PO TABS: 1.0000 | ORAL_TABLET | Freq: Two times a day (BID) | ORAL | Status: AC

## 2015-03-14 MED ORDER — OXYCODONE-ACETAMINOPHEN 5-325 MG PO TABS: 1.0000 | ORAL_TABLET | Freq: Four times a day (QID) | ORAL | Status: DC | PRN

## 2015-03-14 MED ORDER — AMLODIPINE BESYLATE 5 MG PO TABS: 5.0000 mg | ORAL_TABLET | Freq: Every day | ORAL | Status: DC

## 2015-03-14 MED ORDER — POTASSIUM & SODIUM PHOSPHATES 280-160-250 MG PO PACK: 1.0000 | PACK | Freq: Three times a day (TID) | ORAL | Status: DC

## 2015-03-14 MED ORDER — ALPRAZOLAM 0.25 MG PO TABS: 0.2500 mg | ORAL_TABLET | Freq: Two times a day (BID) | ORAL | Status: DC | PRN

## 2015-03-14 MED ORDER — ENSURE ENLIVE PO LIQD
237.0000 mL | Freq: Two times a day (BID) | ORAL | Status: DC
Start: 2015-03-14 — End: 2015-05-08

## 2015-03-14 MED ORDER — APIXABAN 5 MG PO TABS: 5.0000 mg | ORAL_TABLET | Freq: Two times a day (BID) | ORAL | Status: DC

## 2015-03-14 NOTE — Discharge Summary (Addendum)
[name], is a 80 y.o. female  DOB [birth year]  MRN [id number].  Admission date:  03/02/2015  Admitting Physician  Phillips Grout, MD  Discharge Date:  03/14/2015   Primary MD  Terald Sleeper, PA-C  Recommendations for primary care physician for things to follow:  - please check CBC, BMP, phosphorus in 3 days - Patient to follow with gastroenterology Dr. Frederico Hamman regarding: Mass finding on CT abdomen pelvis on scheduled appointment January 16th  at 3 PM    Admission Diagnosis  Left lower quadrant pain [R10.32] Perforated sigmoid colon ([place]) [K63.1] Gastrointestinal hemorrhage, unspecified gastritis, unspecified gastrointestinal hemorrhage type [K92.2]   Discharge Diagnosis  Left lower quadrant pain [R10.32] Perforated sigmoid colon ([place]) [K63.1] Gastrointestinal hemorrhage, unspecified gastritis, unspecified gastrointestinal hemorrhage type [K92.2]    Principal Problem:   Perforated sigmoid colon ([place]) Active Problems:   HYPERCHOLESTEROLEMIA   Anxiety state   DIASTOLIC DYSFUNCTION   GERD   HTN (hypertension)   Coronary artery disease   Abdominal pain   Lactic acidosis   Prolonged Q-T interval on ECG   CKD (chronic kidney disease) stage 3, GFR 30-59 ml/min   AKI (acute kidney injury) ([place])   Atrial flutter ([place])      Past Medical History  Diagnosis Date  . Depressive disorder, not elsewhere classified   . Anxiety state, unspecified   . Pure hypercholesterolemia   . Esophageal reflux   . Infection of esophagostomy ([place])   . Congestive heart failure, unspecified     diastolic heart failure  . Pulmonary hypertension (HCC)     PA systolic pressure [id number] mmHg by echocardiogram, PA pressure 33/10 by cardiac catheterization PA saturation 62% thermodilution cardiac index 2.0 thick cardiac index 2.4  . Bradycardia     previously with coreg  . Tricuspid regurgitation     moderate  tricuspid regurgitation by echocardiogram, prior use of anorexic agents  . Melanoma ([place]) 1995    removal on back   . Coronary artery disease     a. Moderate to severe coronary disease involving the left anterior descending artery and right coronary artery.  Sequential stenosis in the LAD is significant.  Right coronary artery is moderate to severely likely nonischemic. Questionable small LVOT obstruction.  No Brockenbrough maneuver was performed.  Catheterization January 2013; b. 05/2014 MV no isch/infarct, EF 60%.  Marland Kitchen PONV (postoperative nausea and vomiting)   . Hypertension   . H/O hiatal hernia   . Gastritis   . [name]'s esophagus   . IBS (irritable bowel syndrome)   . Macular degeneration   . Dysrhythmia     Past Surgical History  Procedure Laterality Date  . Nissen fundoplication  99991111  . Rotator cuff repair  ~ 2001    right  . Coronary angioplasty with stent placement  05/08/11    "1"  . Tonsillectomy  ~ 1944  . Shoulder arthroscopy  ~ 2004; 2005    left; "joint's wore out"  . Cataract extraction w/ intraocular lens  implant, bilateral  ~  2002  . Tubal ligation  1972  . Percutaneous coronary stent intervention (pci-s) N/A 05/08/2011    Procedure: PERCUTANEOUS CORONARY STENT INTERVENTION (PCI-S);  Surgeon: Wellington Hampshire, MD;  Location: [facility] CATH LAB;  Service: Cardiovascular;  Laterality: N/A;  . Esophageal manometry N/A 06/06/2014    Procedure: ESOPHAGEAL MANOMETRY (EM);  Surgeon: Jerene Bears, MD;  Location: [facility] ENDOSCOPY;  Service: Gastroenterology;  Laterality: N/A;  . Laparoscopic nissen fundoplication N/A [id number]    Procedure: LAPAROSCOPIC  LYSIS OF ADHESIONS, SEGMENTAL GASTRECTOMY, PARTIAL REDUCTION OF HERNIA;  Surgeon: [name], MD;  Location: [facility] ORS;  Service: General;  Laterality: N/A;  . Hernia repair    . Colectomy with colostomy creation/hartmann procedure N/A 03/05/2015    Procedure: PARTIAL COLECTOMY WITH COLOSTOMY CREATION/HARTMANN PROCEDURE;  Surgeon: Aviva Signs, MD;  Location: [facility] ORS;  Service: General;  Laterality: N/A;  . Cardiac catheterization Left 03/05/2015    Procedure: CENTRAL LINE INSERTION;  Surgeon: Aviva Signs, MD;  Location: [facility] ORS;  Service: General;  Laterality: Left;       History of present illness and  Hospital Course:     Kindly see H&P for history of present illness and admission details, please review complete Labs, Consult reports and Test reports for all details in brief  HPI  from the history and physical done on the day of admission  80 yo female h/o CAD, hiatal hernia, several months of epigastric /back pain being seen and work up by GI as outpatient comes in tonight with a different kind of abdominal pain new and severe since around 4p in the LLQ mostly of her abdomen with associated nausea no vomiting. No fevers. No chills. No diarrhea. The pain was sudden onset and has been persistent since around 4pm. Ct shows perforated bowel. Dr Arnoldo Morale called with general surgery by EDP. Pt has not had any vomiting since arrival, pain was better with dilaudid in the ED, but is now again a 9/10. Pt referred for admission for perforated bowel.  Hospital Course  17 yow presented with complaints of LLQ abdominal pain with associated nausea. CT scan reveal perforated bowel. General surgery preformed partial colectomy 120/04 with no immediate complications. Her overall hospital course has been complicated by development of Afib. She has been started on rate control and since converted back to SR. Her CHADSVASc score is 6, cardiology consulted, and recommended to continue patient on anticoagulation. Repeat CT abdomen with questionable abscess, Dr. Arnoldo Morale discussed with interventional radiology, these findings more consistent with postoperative changes.  perforated sigmoid colon  - As evident on CT abdomen pelvis on 12/2 . - Surgical consult greatly appreciated, status post partial colectomy with colostomy/Hartmann procedure  on 03/05/2015, pathology significant for DIVERTICULITIS WITH ABSCESS, PERFORATION AND ASSOCIATED ACUTE SEROSITIS. - Management per surgery, pain is well controlled, tolerating soft diet. - Repeat CT abdomen pelvis 12/9 done secondary to worsening leukocytosis, initial findings suggestive of abscess, Dr. Arnoldo Morale discussed with interventional radiology, most of these findings are postoperative, or indeterminant, no indication for percutaneous drain currently, may need repeat CT abdomen and future if no improvement. . - Treated  with IV Zosyn, IV Flagyl during hospital stay ,leukocytosis resolved , continue with another 5 days of oral Augmentin as an outpatient .  Finding of mass in ascending colon - This will need to be further worked up and patient is more stable from surgical point of view. - Appointment has been arranged with gastric neurology Dr. Coralee Rud at 3 PM, patient informed about  importance of follow-up  Leukocytosis - Please see above discussion. - as well discontinued patient left subclavian TLC 12/9 , started empirically on IV vancomycin, blood cultures no growth to date so far,  IV vancomycin  stoppedas blood cultures remain negative. - Resolved    paroxysmal A. fib with RVR - Postoperative, currently converted to normal sinus rhythm,  acquiringrdizem drip initially , started on metoprolol during hospital stay , continue on discharge. - CHADSVASC=6 Started on Eliquis  Acute on chronic kidney stage III  - Likely related to IV contrast, resolved with IV fluids   Anemia - hemoglobin of 7.9 /10, transfused 1 unit PRBC 12/10, glycohemoglobin is 8.9 today .  Hypokalemia/hypophosphatemia  - Repleted, continue with supplements on discharge   GERD  - Continue with PPI   Coronary artery disease  - Continue with and statin, aspirin has been stopped giving patient started on Eliquis.  Hypertension  - Better controlled after starting amlodipine, continue with  metoprolol and losartan.  hyperlipidemia - Continue with statin    Discharge Condition: stable   Follow UP  Follow-up Information    Schedule an appointment as soon as possible for a visit with Terald Sleeper, PA-C.   Specialty:  General Practice   Why:  After discharge from SNF   Contact information:   [address] [place] [address] [phone]       Go to Jerene Bears, MD.   Specialty:  Gastroenterology   Why:  office on January 16th at 3:00 PM for colon  Mass   Contact information:   [address] [place] [address] [phone]         Discharge Instructions  and  Discharge Medications       Discharge Instructions    Discharge instructions    Complete by:  As directed   Follow with Primary MD Terald Sleeper, PA-C after discharge from SNF  Get CBC, CMP,  checked  by Primary MD next visit.    Activity: As tolerated with Full fall precautions use walker/cane & assistance as needed   Disposition SNF   Diet: Heart Healthy , soft diet , with feeding assistance and aspiration precautions.  For Heart failure patients - Check your Weight same time everyday, if you gain over 2 pounds, or you develop in leg swelling, experience more shortness of breath or chest pain, call your Primary MD immediately. Follow Cardiac Low Salt Diet and 1.5 lit/day fluid restriction.   On your next visit with your primary care physician please Get Medicines reviewed and adjusted.   Please request your Prim.MD to go over all Hospital Tests and Procedure/Radiological results at the follow up, please get all Hospital records sent to your Prim MD by signing hospital release before you go home.   If you experience worsening of your admission symptoms, develop shortness of breath, life threatening emergency, suicidal or homicidal thoughts you must seek medical attention immediately by calling 911 or calling your MD immediately  if symptoms less severe.  You Must read  complete instructions/literature along with all the possible adverse reactions/side effects for all the Medicines you take and that have been prescribed to you. Take any new Medicines after you have completely understood and accpet all the possible adverse reactions/side effects.   Do not drive, operating heavy machinery, perform activities at heights, swimming or participation in water activities or provide baby sitting services if your were admitted for syncope or siezures until you have seen by Primary  MD or a Neurologist and advised to do so again.  Do not drive when taking Pain medications.    Do not take more than prescribed Pain, Sleep and Anxiety Medications  Special Instructions: If you have smoked or chewed Tobacco  in the last 2 yrs please stop smoking, stop any regular Alcohol  and or any Recreational drug use.  Wear Seat belts while driving.   Please note  You were cared for by a hospitalist during your hospital stay. If you have any questions about your discharge medications or the care you received while you were in the hospital after you are discharged, you can call the unit and asked to speak with the hospitalist on call if the hospitalist that took care of you is not available. Once you are discharged, your primary care physician will handle any further medical issues. Please note that NO REFILLS for any discharge medications will be authorized once you are discharged, as it is imperative that you return to your primary care physician (or establish a relationship with a primary care physician if you do not have one) for your aftercare needs so that they can reassess your need for medications and monitor your lab values.     Discharge wound care:    Complete by:  As directed   WOC ostomy : Stoma type/location: LLQ, end colostomy Stomal assessment/size: pink, moist, os points downward at 6 o'clock  Peristomal assessment: some mild redness, I will let nurses know at [facility]  to monitor may end up needing some antifungal powder to crust the area for a while.  Treatment options for stomal/peristomal skin: using 2" barrier ring to manage crease in the skin and the downward os.  Output none in pouch today Ostomy pouching: 1pc.with 2" barrier ring.  Education provided: Reviewed steps for pouch change with patient only today. She seems a bit more clear headed today. 1. Cut new pouch/pattern in the room -measure stoma at least weekly. Make sure new pouch is closed. 2. Remove old pouch gently  3. Clean around stoma and stoma with water only/washcloth. Allow peristomal skin to dry. 4. Stretch barrier ring to fit around the stoma. 5. Place new pouch on skin, place hand over wafer for about 10 mins to warm barrier to the skin.  Patient demonstrated lock and roll closure to [facility] nurse a couple of times.  Will need continued education at the [facility] on care, emptying. WOC will remain available as needed. Will contact [facility] staff as well.   Enrolled patient in [place] Start Discharge program: Yes  Melody Lynita [place], [place] [phone])        Complaint patient wound with soap and water and change to dry dressing every day     Increase activity slowly    Complete by:  As directed             Medication List    STOP taking these medications        alendronate 70 MG tablet  Commonly known as:  FOSAMAX     aspirin 81 MG chewable tablet     ibuprofen 200 MG tablet  Commonly known as:  ADVIL,MOTRIN     loratadine 10 MG tablet  Commonly known as:  CLARITIN     zolpidem 10 MG tablet  Commonly known as:  AMBIEN      TAKE these medications        ALPRAZolam 0.25 MG tablet  Commonly known as:  Duanne Moron  Take 1 tablet (0.25 mg total) by mouth 2 (two) times daily as needed for anxiety.     amLODipine 5 MG tablet  Commonly known as:  NORVASC  Take 1 tablet (5 mg total) by mouth daily.     amoxicillin-clavulanate 875-125 MG  tablet  Commonly known as:  AUGMENTIN  Take 1 tablet by mouth 2 (two) times daily. Take for 5 days then stop.     apixaban 5 MG Tabs tablet  Commonly known as:  ELIQUIS  Take 1 tablet (5 mg total) by mouth 2 (two) times daily.     citalopram 40 MG tablet  Commonly known as:  CELEXA  Take 0.5 tablets (20 mg total) by mouth daily.     esomeprazole 40 MG capsule  Commonly known as:  NEXIUM  Take 20 mg by mouth daily before breakfast.     feeding supplement (ENSURE ENLIVE) Liqd  Take 237 mLs by mouth 2 (two) times daily between meals.     ferrous sulfate 325 (65 FE) MG EC tablet  Take 325 mg by mouth daily with breakfast.     losartan 100 MG tablet  Commonly known as:  COZAAR  Take 1 tablet (100 mg total) by mouth daily.     metoprolol tartrate 25 MG tablet  Commonly known as:  LOPRESSOR  Take 1 tablet (25 mg total) by mouth 2 (two) times daily.     nitroGLYCERIN 0.4 MG SL tablet  Commonly known as:  NITROSTAT  Place 0.4 mg under the tongue every 5 (five) minutes as needed for chest pain.     ondansetron 4 MG disintegrating tablet  Commonly known as:  ZOFRAN ODT  4mg  ODT q4 hours prn nausea/vomit     oxyCODONE-acetaminophen 5-325 MG tablet  Commonly known as:  PERCOCET/ROXICET  Take 1 tablet by mouth every 6 (six) hours as needed.     potassium & sodium phosphates 280-160-250 MG Pack  Commonly known as:  PHOS-NAK  Take 1 packet by mouth 4 (four) times daily -  with meals and at bedtime. Take for 2 weeks then stop     PRESERVISION AREDS Tabs  Take 1 tablet by mouth 2 (two) times daily.     PROAIR HFA 108 (90 BASE) MCG/ACT inhaler  Generic drug:  albuterol  Inhale 2 puffs into the lungs every 6 (six) hours as needed for shortness of breath.     rosuvastatin 10 MG tablet  Commonly known as:  CRESTOR  Take 10 mg by mouth at bedtime.     SYSTANE BALANCE OP  Place 1-2 drops into both eyes daily as needed (dry eyes).     traZODone 100 MG tablet  Commonly known as:   DESYREL  Take 100 mg by mouth at bedtime.     Vitamin D (Ergocalciferol) 50000 UNITS Caps capsule  Commonly known as:  DRISDOL  Take 50,000 Units by mouth every 7 (seven) days. Patient takes on Tuesdays          Diet and Activity recommendation: See Discharge Instructions above   Consults obtained -  surgery   cardiology  Major procedures and Radiology Reports - PLEASE review detailed and final reports for all details, in brief -   - partial colectomy with colostomy/Hartmann procedure on 03/05/2015 - PICC line 12/9 - Discontinued left subclavian TLC 12/9 - 1 unit PRBC 12/10  Ct Abdomen Pelvis W Contrast  03/11/2015  ADDENDUM REPORT: 03/11/2015 08:44 ADDENDUM: Findings and [place] reviewed with Dr. Arnoldo Morale. Left hemipelvis and  posterior sacral retroperitoneal collection is mostly air. No significant fluid component for drain catheter. Second Left lower quadrant small fluid collection with a single focus of air, image 64 extends slightly midline over the bladder. This remains indeterminate. White count has decreased from from 18 to 15. Patient is only 6 days postop. Will hold on percutaneous drainage and consider repeat CT next week. Electronically Signed   By: Jerilynn Mages.  Shick M.D.   On: 03/11/2015 08:44  03/11/2015  CLINICAL DATA:  80 year old who underwent sigmoid colectomy and descending colostomy related to colonic perforation on 03/05/2015, now with worsening leukocytosis. EXAM: CT ABDOMEN AND PELVIS WITH CONTRAST TECHNIQUE: Multidetector CT imaging of the abdomen and pelvis was performed using the standard protocol following bolus administration of intravenous contrast. CONTRAST:  128mL OMNIPAQUE IOHEXOL 300 MG/ML IV. COMPARISON:  Preoperative CT abdomen and pelvis 03/03/2015, 02/10/2015. FINDINGS: Lower chest: Small to moderate-sized right pleural effusion and associated minimal passive atelectasis in the right lower lobe. Small left pleural effusion. Chronic elevation of the left  hemidiaphragm with chronic scar/atelectasis in the left lower lobe. Heart moderately enlarged, particularly the left atrium. Hepatobiliary: Liver normal in size and appearance. Gallbladder unremarkable by CT. No biliary ductal dilation. Pancreas: Severe diffuse atrophy. No focal pancreatic parenchymal abnormality. Spleen: Normal in size and appearance. Surgical clips again noted adjacent to the medial spleen Adrenals/Urinary Tract: Normal appearing adrenal glands. Mildly atrophic left kidney with diffuse cortical thinning. Nonobstructing approximate 5 mm calculus in a lower pole calix of the left kidney. No right urinary tract calculi. Cortical cysts involving both kidneys, the largest arising from the lower pole of the left kidney approximating 3.5 cm. No significant focal abnormality involving either kidney. Urinary bladder contains gas, presumably related to recent instrumentation, and is otherwise normal in appearance. Stomach/Bowel: Surgical suture material involving the stomach related to prior Nissen fundoplication. Recurrent very large hiatal hernia with at least 2/3 of the stomach present in the chest, unchanged from the prior examinations. Distended loops of jejunum in the left upper quadrant of the abdomen, with normal caliber loops of distal jejunum and ileum. Cecum decompressed which I believe accounts for apparent wall thickening, positioned anteriorly in the upper pelvis. Approximate 2.6 cm mass in the ascending colon, obscured by stool on the preoperative CT. Satisfactory appearance of the descending colostomy. Vascular/Lymphatic: Severe atherosclerosis involving the abdominal aorta and moderate iliofemoral atherosclerosis without aneurysm. No pathologic lymphadenopathy. Reproductive: Atrophic uterus consistent with age. No adnexal masses. Other: Adjacent fluid collections with enhancing wall, containing gas, in the right side of the mid and lower pelvis. The fluid collection more superiorly has  adjacent phlegmonous change and residual extraluminal gas. In total, the phlegmon and fluid collection measure approximately 4.2 x 6.6 x 4.5 cm. The fluid collection itself which contains a large amount of gas approximates 3.3 cm maximally. The fluid collection more inferiorly in the pelvis is oval-shaped and extends into the low pelvis just above the urinary bladder. The collection is difficult accurately measure, though extends over an approximate 9-10 cm superoinferior length. Phlegmonous change with associated extraluminal gas is present in the left side of the low pelvis extending into the presacral space, extending over an approximate 11 cm length. A surgical drain is in place in the low pelvis. Musculoskeletal: Degenerative disc disease at L4-5 and L5-S1. Generalized osseous demineralization. Lower thoracic spondylosis. IMPRESSION: 1. At least 3 abscesses in the left side of the pelvis as described above. Two of these collections have predominantly phlegmonous change with extraluminal gas. The  largest fluid collection spans approximately 11 cm in length and would be amenable to percutaneous drainage. The other 2 predominantly phlegmonous collections would likely not be well treated by percutaneous drainage at this point. 2. Approximate 2.6 cm mass involving the ascending colon, best seen on the coronal reformatted images, worrisome for malignancy. This lesion was obscured by stool on the preoperative CT. 3. Dilated loops of jejunum in the left upper quadrant, more likely to represent postoperative ileus than obstruction, though followup abdominal x-rays may be helpful. 4. Bilateral pleural effusions, right greater than left, with associated mild passive atelectasis in the right lower lobe. 5. Gas within the urinary bladder, presumably related to recent instrumentation. If the patient has not had a Foley catheter placed recently, an enteric-vesical fistula is possible. 6. Numerous chronic findings including  large hiatal hernia, severe pancreatic atrophy, mildly atrophic left kidney with a nonobstructing 5 mm lower pole calculus. Electronically Signed: By: Evangeline Dakin M.D. On: 03/10/2015 21:08   Ct Abdomen Pelvis W Contrast  03/03/2015  CLINICAL DATA:  Sudden onset abdominal pain in the left lower quadrant at 17:00. EXAM: CT ABDOMEN AND PELVIS WITH CONTRAST TECHNIQUE: Multidetector CT imaging of the abdomen and pelvis was performed using the standard protocol following bolus administration of intravenous contrast. CONTRAST:  169mL OMNIPAQUE IOHEXOL 300 MG/ML  SOLN COMPARISON:  02/10/2015 FINDINGS: There is a large volume of extraluminal air, predominantly retroperitoneal. The extraluminal air probably originates from a perforation of the mid sigmoid colon. There is no bowel obstruction. No abscess or drainable fluid collection is evident. Large hiatal hernia again noted. There are unremarkable appearances of the liver, spleen, pancreas, adrenals and kidneys except for a few benign renal cysts. Uterus and ovaries are unremarkable. Abdominal aorta is normal in caliber with moderate atherosclerotic calcification. No significant abnormalities evident in the lower chest. No significant musculoskeletal lesion is evident. IMPRESSION: Large volume extraluminal air, predominantly retroperitoneal, probably originating from a perforation of the mid sigmoid colon. Critical Value/emergent results were called by telephone at the time of interpretation on 03/03/2015 at 12:45 am to Dr. Tomi Bamberger , who verbally acknowledged these results. Electronically Signed   By: Andreas Newport M.D.   On: 03/03/2015 00:49   Dg Chest Port 1 View  03/10/2015  CLINICAL DATA:  PICC line placement EXAM: PORTABLE CHEST 1 VIEW COMPARISON:  Radiograph 03/05/2015 FINDINGS: Interval placement of a RIGHT PICC line which tip deflects into the brachiocephalic vein. LEFT central venous line with tip in the proximal brachiocephalic vein. No pneumothorax.  Improvement in RIGHT lower lobe atelectasis. IMPRESSION: 1. RIGHT PICC line with tip deflected into the distal aspect the brachiocephalic vein. 2. Improvement in RIGHT lower lobe atelectasis. Electronically Signed   By: Suzy Bouchard M.D.   On: 03/10/2015 14:27   Dg Chest Port 1 View  03/05/2015  CLINICAL DATA:  Central line placement. EXAM: PORTABLE CHEST 1 VIEW COMPARISON:  03/04/2015 and 03/02/2015. FINDINGS: 1304 hours. New left subclavian central venous catheter projected laterally at the mid SVC level near the origin of the azygos vein. The heart size and mediastinal contours are stable with a known hiatal hernia. There is no pneumothorax. Medial right basilar airspace disease appears unchanged. The left lung is clear. IMPRESSION: Central venous catheter tip at the mid SVC level near the origin of the azygos vein. No pneumothorax. No significant change in medial right basilar airspace disease. Electronically Signed   By: Richardean Sale M.D.   On: 03/05/2015 14:15   Dg Chest Port 1  View  03/04/2015  CLINICAL DATA:  Hypoxia EXAM: PORTABLE CHEST 1 VIEW COMPARISON:  Two days ago FINDINGS: There is streaky right infrahilar lung opacity which is newly developed. No edema, effusion, or pneumothorax. Lung volumes are low. Chronic cardiomegaly with superimposed large hiatal hernia. IMPRESSION: New right middle lobe atelectasis or consolidation. Given gas-filled hiatal hernia, question underlying aspiration. Electronically Signed   By: Monte Fantasia M.D.   On: 03/04/2015 15:43   Dg Chest Port 1v Same Day  03/10/2015  CLINICAL DATA:  PICC line placement. Congestive heart failure and coronary artery disease. EXAM: PORTABLE CHEST 1 VIEW COMPARISON:  03/10/2015 FINDINGS: The right arm PICC line has been repositioned, with tip now all in the mid to distal SVC. Left subclavian central venous catheter tip overlies the left innominate vein is unchanged in position. No evidence of pneumothorax or pleural effusion.  Heart size is stable. Both lungs are clear. Moderate to large hiatal hernia again noted. IMPRESSION: Right arm PICC line now in appropriate position. No active lung disease.  Hiatal hernia. Electronically Signed   By: Earle Gell M.D.   On: 03/10/2015 17:22   Dg Chest Port 1v Same Day  03/10/2015  CLINICAL DATA:  Re- ingested PICC EXAM: PORTABLE CHEST 1 VIEW COMPARISON:  Earlier today FINDINGS: Unchanged appearance of the right upper extremity PICC with tip in the distal left brachiocephalic vein. Unchanged left-sided PICC, tip at the left brachiocephalic vein. Chronic cardiomegaly. Right lower mediastinal contour abnormality correlating with large hiatal hernia. There is no edema, consolidation, effusion, or pneumothorax. IMPRESSION: Unchanged positioning of bilateral upper extremity PICCs, with both tips at the left brachiocephalic vein. Electronically Signed   By: Monte Fantasia M.D.   On: 03/10/2015 15:14   Dg Abd Acute W/chest  03/02/2015  CLINICAL DATA:  Left lower quadrant pain. Bloody diarrhea. Shortness of breath. EXAM: DG ABDOMEN ACUTE W/ 1V CHEST COMPARISON:  None. FINDINGS: There is no evidence of dilated bowel loops or free intraperitoneal air. Large amount of stool throughout the colon. No radiopaque calculi or other significant radiographic abnormality is seen. Heart size and mediastinal contours are within normal limits. Both lungs are clear. IMPRESSION: Large amount of stool throughout the colon. No acute cardiopulmonary disease. Electronically Signed   By: Kathreen Devoid   On: 03/02/2015 23:15   Dg Duanne Limerick W/high Density W/kub  02/17/2015  CLINICAL DATA:  Patient status post partial reduction of esophageal large hiatal hernia. Patient has intermittent RIGHT-sided pain and vomiting. EXAM: UPPER GI SERIES WITH KUB TECHNIQUE: After obtaining a scout radiograph a routine upper GI series was performed using high-density FLUOROSCOPY TIME:  Radiation Exposure Index (as provided by the fluoroscopic  device): If the device does not provide the exposure index: Fluoroscopy Time (in minutes and seconds):  1 minutes 48 seconds Number of Acquired Images:  15 COMPARISON:  CT 02/10/2015, upper GI 09/27/2014 FINDINGS: Initial KUB demonstrates surgical clips in the gastric cardia region. Administration of contrast demonstrate no stricture or mass within the esophagus. Contrast flowed into the stomach. There is a large hiatal hernia present with approximately 60% of the stomach above the diaphragm. No retained food stuff within the stomach. The herniated stomach is within the the central and RIGHT side of the mediastinum. The herniated stomach is patulous and retains barium but the barium eventually flows through the diaphragmatic hiatus into the gastric antrum. Ultimately contrast flows into the C-loop of the duodenum and ligament Treitz in the LEFT upper quadrant. There is some retention of contrast  within the stomach at the end of the study. IMPRESSION: 1. Large sliding-type hiatal hernia which is patulous and retains oral contrast. 2. No evidence of high-grade obstruction with contrast flowing into the proximal small bowel. 3. No evidence of esophageal obstruction. 4. No retained food stuff within the stomach at beginning of study. Electronically Signed   By: Suzy Bouchard M.D.   On: 02/17/2015 12:05    Micro Results     Recent Results (from the past 240 hour(s))  Culture, blood (routine x 2)     Status: None (Preliminary result)   Collection Time: 03/10/15  9:12 AM  Result Value Ref Range Status   Specimen Description BLOOD RIGHT ANTECUBITAL  Final   Special Requests BOTTLES DRAWN AEROBIC AND ANAEROBIC 12CC EACH  Final   Culture NO GROWTH 4 DAYS  Final   Report Status PENDING  Incomplete  Culture, blood (routine x 2)     Status: None (Preliminary result)   Collection Time: 03/10/15  9:18 AM  Result Value Ref Range Status   Specimen Description BLOOD PICC LINE DRAWN BY RN  Final   Special  Requests BOTTLES DRAWN AEROBIC AND ANAEROBIC 6CC EACH  Final   Culture NO GROWTH 4 DAYS  Final   Report Status PENDING  Incomplete       Today   Subjective:   [name] today has no headache,no chest abdominal pain,no new weakness tingling or numbness, feels much better wants to go home today.   Objective:   Blood pressure 173/64, pulse 65, temperature 98.1 F (36.7 C), temperature source Oral, resp. rate 20, height 5\' 3"  (1.6 m), weight 70.806 kg (156 lb 1.6 oz), SpO2 98 %.   Intake/Output Summary (Last 24 hours) at 03/14/15 1444 Last data filed at 03/14/15 0942  Gross per 24 hour  Intake   1812 ml  Output    350 ml  Net   1462 ml    Exam  General: NAD. Appears calm and looks comfortable lying in bed.   Cardiovascular: Irregular   Respiratory: clear bilaterally, No wheezing, rales or rhonchi  Abdomen: soft, no distention, Colostomy noted, midline surgical scar with no bleed or discharge,  Musculoskeletal: No edema b/l  Psychiatric: Oriented to self, place, and reason for being in the hospital.  Data Review   CBC w Diff:  Lab Results  Component Value Date   WBC 9.4 03/14/2015   HGB 8.9* 03/14/2015   HCT 26.9* 03/14/2015   PLT 359 03/14/2015   LYMPHOPCT 12 03/02/2015   MONOPCT 5 03/02/2015   EOSPCT 1 03/02/2015   BASOPCT 0 03/02/2015    CMP:  Lab Results  Component Value Date   NA 135 03/13/2015   K 3.5 03/13/2015   CL 106 03/13/2015   CO2 23 03/13/2015   BUN 6 03/13/2015   CREATININE 0.81 03/13/2015   PROT 4.9* 03/07/2015   ALBUMIN 2.1* 03/07/2015   BILITOT 0.5 03/07/2015   ALKPHOS 64 03/07/2015   AST 22 03/07/2015   ALT 16 03/07/2015  .   Total Time in preparing paper work, data evaluation and todays exam - 35 minutes  Jayshaun Phillips M.D on 03/14/2015 at 2:44 PM  [facility]   Office  [phone]

## 2015-03-14 NOTE — Progress Notes (Signed)
Merle R Betker discharged to [facility], Skilled nursing facility per MD order.  Report called to receiving nurse at 1640.     Medication List    STOP taking these medications        alendronate 70 MG tablet  Commonly known as:  FOSAMAX     aspirin 81 MG chewable tablet     ibuprofen 200 MG tablet  Commonly known as:  ADVIL,MOTRIN     loratadine 10 MG tablet  Commonly known as:  CLARITIN     zolpidem 10 MG tablet  Commonly known as:  AMBIEN      TAKE these medications        ALPRAZolam 0.25 MG tablet  Commonly known as:  XANAX  Take 1 tablet (0.25 mg total) by mouth 2 (two) times daily as needed for anxiety.     amLODipine 5 MG tablet  Commonly known as:  NORVASC  Take 1 tablet (5 mg total) by mouth daily.     amoxicillin-clavulanate 875-125 MG tablet  Commonly known as:  AUGMENTIN  Take 1 tablet by mouth 2 (two) times daily. Take for 5 days then stop.     apixaban 5 MG Tabs tablet  Commonly known as:  ELIQUIS  Take 1 tablet (5 mg total) by mouth 2 (two) times daily.     citalopram 40 MG tablet  Commonly known as:  CELEXA  Take 0.5 tablets (20 mg total) by mouth daily.     esomeprazole 40 MG capsule  Commonly known as:  NEXIUM  Take 20 mg by mouth daily before breakfast.     feeding supplement (ENSURE ENLIVE) Liqd  Take 237 mLs by mouth 2 (two) times daily between meals.     ferrous sulfate 325 (65 FE) MG EC tablet  Take 325 mg by mouth daily with breakfast.     losartan 100 MG tablet  Commonly known as:  COZAAR  Take 1 tablet (100 mg total) by mouth daily.     metoprolol tartrate 25 MG tablet  Commonly known as:  LOPRESSOR  Take 1 tablet (25 mg total) by mouth 2 (two) times daily.     nitroGLYCERIN 0.4 MG SL tablet  Commonly known as:  NITROSTAT  Place 0.4 mg under the tongue every 5 (five) minutes as needed for chest pain.     ondansetron 4 MG disintegrating tablet  Commonly known as:  ZOFRAN ODT  4mg  ODT q4 hours prn nausea/vomit     oxyCODONE-acetaminophen 5-325 MG tablet  Commonly known as:  PERCOCET/ROXICET  Take 1 tablet by mouth every 6 (six) hours as needed.     potassium & sodium phosphates 280-160-250 MG Pack  Commonly known as:  PHOS-NAK  Take 1 packet by mouth 4 (four) times daily -  with meals and at bedtime. Take for 2 weeks then stop     PRESERVISION AREDS Tabs  Take 1 tablet by mouth 2 (two) times daily.     PROAIR HFA 108 (90 BASE) MCG/ACT inhaler  Generic drug:  albuterol  Inhale 2 puffs into the lungs every 6 (six) hours as needed for shortness of breath.     rosuvastatin 10 MG tablet  Commonly known as:  CRESTOR  Take 10 mg by mouth at bedtime.     SYSTANE BALANCE OP  Place 1-2 drops into both eyes daily as needed (dry eyes).     traZODone 100 MG tablet  Commonly known as:  DESYREL  Take 100 mg by mouth at bedtime.  Vitamin D (Ergocalciferol) 50000 UNITS Caps capsule  Commonly known as:  DRISDOL  Take 50,000 Units by mouth every 7 (seven) days. Patient takes on Tuesdays        Patients skin is clean, dry and intact, no evidence of skin break down. IV site discontinued and catheter remains intact. Site without signs and symptoms of complications. Dressing and pressure applied.  Patient transported via wheelchair by NT. No distress noted upon discharge.  Polly Cobia 03/14/2015 4:40 PM

## 2015-03-14 NOTE — NC FL2 (Signed)
[place] MEDICAID FL2 LEVEL OF CARE SCREENING TOOL     IDENTIFICATION  Patient Name: [name] Birthdate: Nov 25, 1934 Sex: female Admission Date (Current Location): 03/02/2015  [place] and [place] Number: [facility]  [id number] [place] and Address:  [facility] [address], [place]      Provider Number: [id number]  Attending Physician Name and Address:  Albertine Patricia, MD  Relative Name and Phone Number:       Current Level of Care: Hospital Recommended Level of Care: [place] Prior Approval Number:    Date Approved/Denied:   PASRR Number: [id number]   Discharge Plan: SNF    Current Diagnoses: Patient Active Problem List   Diagnosis Date Noted  . Atrial flutter ([place]) 03/05/2015  . AKI (acute kidney injury) ([place]) 03/04/2015  . Perforated sigmoid colon ([place]) 03/03/2015  . Abdominal pain 03/03/2015  . Perforated bowel ([place]) 03/03/2015  . Lactic acidosis 03/03/2015  . Prolonged Q-T interval on ECG 03/03/2015  . CKD (chronic kidney disease) stage 3, GFR 30-59 ml/min 03/03/2015  . Nausea with vomiting 02/14/2015  . Right-sided low back pain with right-sided sciatica 02/14/2015  . Hiatal hernia 09/26/2014  . Shortness of breath 06/09/2014  . Midsternal chest pain 05/08/2014  . Chest pain 10/23/2012  . Acute kidney injury ([place]) 10/23/2012  . Atrial fibrillation with RVR ([place]) 09/06/2012  . Cellulitis of extremity 09/06/2012  . Acute renal failure ([place]) 09/06/2012  . Hypokalemia 09/06/2012  . Hyponatremia 09/06/2012  . Thrombocytopenia, unspecified ([place]) 09/06/2012  . Sepsis ([place]) 09/06/2012  . Edema 11/28/2011  . History of percutaneous coronary intervention   . Coronary artery disease   . Tricuspid regurgitation   . Symptomatic bradycardia   . DOE (dyspnea on exertion) 02/05/2011  . Palpitations 02/05/2011  . HTN (hypertension) 02/05/2011  . Sinus bradycardia 02/05/2011  . HYPERCHOLESTEROLEMIA 07/16/2007   . Anxiety state 07/16/2007  . DEPRESSION, CHRONIC 07/16/2007  . DIASTOLIC DYSFUNCTION [id number]  . GERD 07/16/2007  . BARRETTS ESOPHAGUS 07/16/2007  . CONSTIPATION 07/16/2007    Orientation RESPIRATION BLADDER Height & Weight    Self, Time, Situation, Place  Normal Continent 5\' 3"  (160 cm) 163 lbs.  BEHAVIORAL SYMPTOMS/MOOD NEUROLOGICAL BOWEL NUTRITION STATUS      Colostomy Diet (DIET SOFT)  AMBULATORY STATUS COMMUNICATION OF NEEDS Skin   Supervision limited Verbally Surgical wounds                       Personal Care Assistance Level of Assistance  Bathing, Dressing Bathing Assistance: Limited assistance   Dressing Assistance: Limited assistance     Functional Limitations Info             SPECIAL CARE FACTORS FREQUENCY                       Contractures      Additional Factors Info  Allergies, Psychotropic   Allergies Info: Suflamethoxazole Psychotropic Info: Haldol, Xanax, Ativan         Current Medications (03/14/2015):  This is the current hospital active medication list Current Facility-Administered Medications  Medication Dose Route Frequency Provider Last Rate Last Dose  . 0.9 %  sodium chloride infusion   Intravenous Continuous Albertine Patricia, MD 75 mL/hr at 03/13/15 [id number]    . acetaminophen (TYLENOL) tablet 650 mg  650 mg Oral Q6H PRN Kathie Dike, MD   650 mg at 03/06/15 1643  . ALPRAZolam (XANAX) tablet 0.25 mg  0.25 mg Oral TID PRN Aviva Signs, MD   0.25 mg at 03/13/15 2244  . alum & mag hydroxide-simeth (MAALOX/MYLANTA) 200-200-20 MG/5ML suspension 30 mL  30 mL Oral Q6H PRN Albertine Patricia, MD   30 mL at 03/11/15 1902  . amLODipine (NORVASC) tablet 5 mg  5 mg Oral Daily Albertine Patricia, MD   5 mg at 03/14/15 [id number]  . apixaban (ELIQUIS) tablet 5 mg  5 mg Oral BID Albertine Patricia, MD   5 mg at 03/14/15 [id number]  . feeding supplement (ENSURE ENLIVE) (ENSURE ENLIVE) liquid 237 mL  237 mL Oral BID BM Oswaldo Milian, RD   237 mL  at 03/14/15 0928  . guaiFENesin (MUCINEX) 12 hr tablet 1,200 mg  1,200 mg Oral BID Kathie Dike, MD   1,200 mg at 03/14/15 0927  . hydrALAZINE (APRESOLINE) injection 10 mg  10 mg Intravenous Q6H PRN Kathie Dike, MD   10 mg at 03/08/15 1605  . HYDROmorphone (DILAUDID) injection 0.5 mg  0.5 mg Intravenous Q4H PRN Albertine Patricia, MD   0.5 mg at 03/14/15 0940  . losartan (COZAAR) tablet 100 mg  100 mg Oral Daily Kathie Dike, MD   100 mg at 03/14/15 [id number]  . magnesium hydroxide (MILK OF MAGNESIA) suspension 30 mL  30 mL Oral Daily PRN Aviva Signs, MD      . metoprolol tartrate (LOPRESSOR) tablet 25 mg  25 mg Oral BID Herminio Commons, MD   25 mg at 03/14/15 [id number]  . metroNIDAZOLE (FLAGYL) IVPB 500 mg  500 mg Intravenous Q8H Aviva Signs, MD   500 mg at 03/14/15 [id number]  . ondansetron (ZOFRAN) tablet 4 mg  4 mg Oral Q6H PRN Phillips Grout, MD       Or  . ondansetron [facility]) injection 4 mg  4 mg Intravenous Q6H PRN Phillips Grout, MD   4 mg at 03/04/15 2146  . pantoprazole (PROTONIX) EC tablet 40 mg  40 mg Oral Q1200 Aviva Signs, MD   40 mg at 03/14/15 1210  . piperacillin-tazobactam (ZOSYN) IVPB 3.375 g  3.375 g Intravenous Q8H Kathie Dike, MD   3.375 g at 03/14/15 [id number]  . potassium & sodium phosphates (PHOS-NAK) 280-160-250 MG packet 1 packet  1 packet Oral TID WC & HS Kathie Dike, MD   1 packet at 03/14/15 1210  . simethicone (MYLICON) chewable tablet 40 mg  40 mg Oral Q6H PRN Aviva Signs, MD   40 mg at 03/05/15 1440  . sodium chloride 0.9 % injection 10-40 mL  10-40 mL Intracatheter Q12H Albertine Patricia, MD   10 mL at 03/14/15 0929  . sodium chloride 0.9 % injection 10-40 mL  10-40 mL Intracatheter PRN Albertine Patricia, MD   70 mL at 03/10/15 1541  . sodium chloride 0.9 % injection 3 mL  3 mL Intravenous Q12H Phillips Grout, MD   3 mL at 03/14/15 [id number]     Discharge Medications: Please see discharge summary for a list of discharge medications.  Relevant Imaging  Results:  Relevant Lab Results:   Additional Information [place] expires 04/07/14.  [name], LCSW

## 2015-03-14 NOTE — Care Management Important Message (Signed)
Important Message  Patient Details  Name: [name] MRN: [id number] Date of Birth: 1934-12-14   Medicare Important Message Given:  Yes    Sherald Barge, RN 03/14/2015, 2:47 PM

## 2015-03-14 NOTE — Care Management Note (Signed)
[facility] Management Note  Patient Details  Name: [name] MRN: [id number] Date of Birth: 1934-12-22  Pt discharging to SNF today. CSW has arranged for placement. Pt and family aware and agreeable to DC plan. No CM Needs.   Expected Discharge Date:       03/14/2015           Expected Discharge Plan:  Skilled Nursing Facility  In-House Referral:  Clinical Social Work  Discharge planning Services  CM Consult  Post Acute Care Choice:  NA Choice offered to:  NA  DME Arranged:    DME Agency:     HH Arranged:    [place] Agency:     Status of Service:  Completed, signed off  Medicare Important Message Given:  Yes Date Medicare IM Given:    Medicare IM give by:    Date Additional Medicare IM Given:    Additional Medicare Important Message give by:     If discussed at [place] of Stay Meetings, dates discussed:  03/14/2015  Additional Comments:  Sherald Barge, RN 03/14/2015, 2:47 PM

## 2015-03-14 NOTE — Clinical Social Work Placement (Signed)
   CLINICAL SOCIAL WORK PLACEMENT  NOTE  Date:  03/14/2015  Patient Details  Name: [name] MRN: [id number] Date of Birth: 1934-08-27  Clinical Social Work is seeking post-discharge placement for this patient at the [place] level of care (*CSW will initial, date and re-position this form in  chart as items are completed):  Yes   Patient/family provided with [place] Work Department's list of facilities offering this level of care within the geographic area requested by the patient (or if unable, by the patient's family).  Yes   Patient/family informed of their freedom to choose among providers that offer the needed level of care, that participate in Medicare, Medicaid or managed care program needed by the patient, have an available bed and are willing to accept the patient.  Yes   Patient/family informed of [place]'s ownership interest in [facility] and [facility], as well as of the fact that they are under no obligation to receive care at these facilities.  PASRR submitted to EDS on 03/09/15     PASRR number received on 03/09/15     Existing PASRR number confirmed on       FL2 transmitted to all facilities in geographic area requested by pt/family on       FL2 transmitted to all facilities within larger geographic area on       Patient informed that his/her managed care company has contracts with or will negotiate with certain facilities, including the following:        Yes   Patient/family informed of bed offers received.  Patient chooses bed at [facility]     Physician recommends and patient chooses bed at      Patient to be transferred to [facility] on 03/14/15.  Patient to be transferred to facility by staff     Patient family notified on 03/14/15 of transfer.  Name of family member notified:  Helene Kelp- daughter     PHYSICIAN       Additional Comment:     _______________________________________________ [name], LCSW 03/14/2015, 3:08 PM [phone]

## 2015-03-14 NOTE — NC FL2 (Signed)
[place] MEDICAID FL2 LEVEL OF CARE SCREENING TOOL     IDENTIFICATION  Patient Name: [name] Birthdate: 1934-06-02 Sex: female Admission Date (Current Location): 03/02/2015  [place] and [place] Number: [facility]  [id number] [place] and Address:  [place] [address], [place]      Provider Number: [id number]  Attending Physician Name and Address:  Albertine Patricia, MD  Relative Name and Phone Number:       Current Level of Care: Hospital Recommended Level of Care: [place] Prior Approval Number:    Date Approved/Denied:   PASRR Number: [id number]   Discharge Plan: SNF    Current Diagnoses: Patient Active Problem List   Diagnosis Date Noted  . Atrial flutter ([place]) 03/05/2015  . AKI (acute kidney injury) ([place]) 03/04/2015  . Perforated sigmoid colon ([place]) 03/03/2015  . Abdominal pain 03/03/2015  . Perforated bowel ([place]) 03/03/2015  . Lactic acidosis 03/03/2015  . Prolonged Q-T interval on ECG 03/03/2015  . CKD (chronic kidney disease) stage 3, GFR 30-59 ml/min 03/03/2015  . Nausea with vomiting 02/14/2015  . Right-sided low back pain with right-sided sciatica 02/14/2015  . Hiatal hernia 09/26/2014  . Shortness of breath 06/09/2014  . Midsternal chest pain 05/08/2014  . Chest pain 10/23/2012  . Acute kidney injury ([place]) 10/23/2012  . Atrial fibrillation with RVR ([place]) 09/06/2012  . Cellulitis of extremity 09/06/2012  . Acute renal failure ([place]) 09/06/2012  . Hypokalemia 09/06/2012  . Hyponatremia 09/06/2012  . Thrombocytopenia, unspecified ([place]) 09/06/2012  . Sepsis ([place]) 09/06/2012  . Edema 11/28/2011  . History of percutaneous coronary intervention   . Coronary artery disease   . Tricuspid regurgitation   . Symptomatic bradycardia   . DOE (dyspnea on exertion) 02/05/2011  . Palpitations 02/05/2011  . HTN (hypertension) 02/05/2011  . Sinus bradycardia 02/05/2011  . HYPERCHOLESTEROLEMIA 07/16/2007   . Anxiety state 07/16/2007  . DEPRESSION, CHRONIC 07/16/2007  . DIASTOLIC DYSFUNCTION [id number]  . GERD 07/16/2007  . BARRETTS ESOPHAGUS 07/16/2007  . CONSTIPATION 07/16/2007    Orientation RESPIRATION BLADDER Height & Weight    Self, Time, Situation, Place  Normal Continent 5\' 3"  (160 cm) 163 lbs.  BEHAVIORAL SYMPTOMS/MOOD NEUROLOGICAL BOWEL NUTRITION STATUS      Continent Diet (DIET SOFT)  AMBULATORY STATUS COMMUNICATION OF NEEDS Skin   Supervision limited Verbally Surgical wounds                       Personal Care Assistance Level of Assistance  Bathing, Dressing Bathing Assistance: Limited assistance   Dressing Assistance: Limited assistance     Functional Limitations Info             SPECIAL CARE FACTORS FREQUENCY                       Contractures      Additional Factors Info  Allergies, Psychotropic   Allergies Info: Suflamethoxazole Psychotropic Info: Haldol, Xanax, Ativan         Current Medications (03/14/2015):  This is the current hospital active medication list Current Facility-Administered Medications  Medication Dose Route Frequency Provider Last Rate Last Dose  . 0.9 %  sodium chloride infusion   Intravenous Continuous Albertine Patricia, MD 75 mL/hr at 03/13/15 [id number]    . acetaminophen (TYLENOL) tablet 650 mg  650 mg Oral Q6H PRN Kathie Dike, MD   650 mg at 03/06/15 1643  . ALPRAZolam (XANAX) tablet 0.25 mg  0.25 mg Oral TID PRN Aviva Signs, MD   0.25 mg at 03/13/15 2244  . alum & mag hydroxide-simeth (MAALOX/MYLANTA) 200-200-20 MG/5ML suspension 30 mL  30 mL Oral Q6H PRN Albertine Patricia, MD   30 mL at 03/11/15 1902  . amLODipine (NORVASC) tablet 5 mg  5 mg Oral Daily Albertine Patricia, MD   5 mg at 03/14/15 [id number]  . apixaban (ELIQUIS) tablet 5 mg  5 mg Oral BID Albertine Patricia, MD   5 mg at 03/14/15 [id number]  . feeding supplement (ENSURE ENLIVE) (ENSURE ENLIVE) liquid 237 mL  237 mL Oral BID BM Oswaldo Milian, RD   237 mL  at 03/14/15 0928  . guaiFENesin (MUCINEX) 12 hr tablet 1,200 mg  1,200 mg Oral BID Kathie Dike, MD   1,200 mg at 03/14/15 0927  . hydrALAZINE (APRESOLINE) injection 10 mg  10 mg Intravenous Q6H PRN Kathie Dike, MD   10 mg at 03/08/15 1605  . HYDROmorphone (DILAUDID) injection 0.5 mg  0.5 mg Intravenous Q4H PRN Albertine Patricia, MD   0.5 mg at 03/14/15 0940  . losartan (COZAAR) tablet 100 mg  100 mg Oral Daily Kathie Dike, MD   100 mg at 03/14/15 [id number]  . magnesium hydroxide (MILK OF MAGNESIA) suspension 30 mL  30 mL Oral Daily PRN Aviva Signs, MD      . metoprolol tartrate (LOPRESSOR) tablet 25 mg  25 mg Oral BID Herminio Commons, MD   25 mg at 03/14/15 [id number]  . metroNIDAZOLE (FLAGYL) IVPB 500 mg  500 mg Intravenous Q8H Aviva Signs, MD   500 mg at 03/14/15 [id number]  . ondansetron (ZOFRAN) tablet 4 mg  4 mg Oral Q6H PRN Phillips Grout, MD       Or  . ondansetron [facility]) injection 4 mg  4 mg Intravenous Q6H PRN Phillips Grout, MD   4 mg at 03/04/15 2146  . pantoprazole (PROTONIX) EC tablet 40 mg  40 mg Oral Q1200 Aviva Signs, MD   40 mg at 03/13/15 1323  . piperacillin-tazobactam (ZOSYN) IVPB 3.375 g  3.375 g Intravenous Q8H Kathie Dike, MD   3.375 g at 03/14/15 [id number]  . potassium & sodium phosphates (PHOS-NAK) 280-160-250 MG packet 1 packet  1 packet Oral TID WC & HS Kathie Dike, MD   1 packet at 03/14/15 [phone]  . simethicone (MYLICON) chewable tablet 40 mg  40 mg Oral Q6H PRN Aviva Signs, MD   40 mg at 03/05/15 1440  . sodium chloride 0.9 % injection 10-40 mL  10-40 mL Intracatheter Q12H Albertine Patricia, MD   10 mL at 03/14/15 0929  . sodium chloride 0.9 % injection 10-40 mL  10-40 mL Intracatheter PRN Albertine Patricia, MD   70 mL at 03/10/15 1541  . sodium chloride 0.9 % injection 3 mL  3 mL Intravenous Q12H Phillips Grout, MD   3 mL at 03/14/15 [id number]     Discharge Medications: Please see discharge summary for a list of discharge medications.  Relevant Imaging  Results:  Relevant Lab Results:   Additional Information [place] expires 04/07/14.  Benna Arno, Clydene Pugh, LCSW

## 2015-03-14 NOTE — Consult Note (Signed)
WOC ostomy follow up Stoma type/location: LLQ, end colostomy Stomal assessment/size: pink, moist, os points downward at 6 o'clock  Peristomal assessment: some mild redness, I will let nurses know at [facility] to monitor may end up needing some antifungal powder to crust the area for a while.  Treatment options for stomal/peristomal skin: using 2" barrier ring to manage crease in the skin and the downward os.  Output none in pouch today Ostomy pouching: 1pc.with 2" barrier ring.  Education provided: Reviewed steps for pouch change with patient only today.  She seems a bit more clear headed today. 1.  Cut new pouch/pattern in the room -measure stoma at least weekly. Make sure new pouch is closed. 2.  Remove old pouch gently  3.  Clean around stoma and stoma with water only/washcloth.  Allow peristomal skin to dry. 4.  Stretch barrier ring to fit around the stoma. 5.  Place new pouch on skin, place hand over wafer for about 10 mins to warm barrier to the skin.  Patient demonstrated lock and roll closure to [facility] nurse a couple of times.  Will need continued education at the [facility] on care, emptying.  WOC will remain available as needed.  Will contact [facility] staff as well.   Enrolled patient in [place] Start Discharge program: Yes  Discussed POC with patient and bedside nurse.  Re consult if needed, will not follow at this time. Thanks  Jarrius Huaracha Kellogg, [place] [phone])

## 2015-03-14 NOTE — Progress Notes (Signed)
9 Days Post-Op  Subjective: Patient overall looks good.  Objective: Vital signs in last 24 hours: Temp:  [98.1 F (36.7 C)-98.5 F (36.9 C)] 98.1 F (36.7 C) (12/13 0623) Pulse Rate:  [63-68] 65 (12/13 0623) Resp:  [18-20] 20 (12/13 0623) BP: (145-173)/(55-68) 173/64 mmHg (12/13 0623) SpO2:  [98 %] 98 % (12/13 0623) Last BM Date: 03/13/15 (per ostomy)  Intake/Output from previous day: 12/12 0701 - 12/13 0700 In: 1932 [P.O.:960; I.V.:972] Out: 350 [Stool:350] Intake/Output this shift:    General appearance: alert, cooperative and no distress GI: Soft, incision healing well by secondary intention. Ostomy pink and patent. JP drainage minimal and thus was removed.  Lab Results:   Recent Labs  03/13/15 0628 03/14/15 0754  WBC 12.6* 9.4  HGB 9.3* 8.9*  HCT 27.5* 26.9*  PLT 349 359   BMET  Recent Labs  03/12/15 0659 03/13/15 0628  NA 134* 135  K 3.7 3.5  CL 105 106  CO2 22 23  GLUCOSE 98 103*  BUN 7 6  CREATININE 0.78 0.81  CALCIUM 7.6* 7.6*   PT/INR No results for input(s): LABPROT, INR in the last 72 hours.  Studies/Results: No results found.  Anti-infectives: Anti-infectives    Start     Dose/Rate Route Frequency Ordered Stop   03/11/15 0900  vancomycin (VANCOCIN) IVPB 750 mg/150 ml premix  Status:  Discontinued     750 mg 150 mL/hr over 60 Minutes Intravenous Every 12 hours 03/11/15 0850 03/13/15 1057   03/10/15 1015  vancomycin (VANCOCIN) 1,500 mg in sodium chloride 0.9 % 500 mL IVPB     1,500 mg 250 mL/hr over 120 Minutes Intravenous  Once 03/10/15 1013 03/10/15 1252   03/09/15 0930  metroNIDAZOLE (FLAGYL) IVPB 500 mg     500 mg 100 mL/hr over 60 Minutes Intravenous Every 8 hours 03/09/15 0919     03/03/15 0900  piperacillin-tazobactam (ZOSYN) IVPB 3.375 g     3.375 g 12.5 mL/hr over 240 Minutes Intravenous Every 8 hours 03/03/15 0739     03/03/15 0100  piperacillin-tazobactam (ZOSYN) IVPB 3.375 g     3.375 g 100 mL/hr over 30 Minutes  Intravenous  Once 03/03/15 0049 03/03/15 0121      Assessment/Plan: s/p Procedure(s): PARTIAL COLECTOMY WITH COLOSTOMY CREATION/HARTMANN PROCEDURE CENTRAL LINE INSERTION Impression: Patient overall continues to progress slowly but well.  Plan: Okay for discharge to nursing facility. Would keep midline incision clean and dry. I will follow patient at [facility]. Agree with continuing Augmentin for 1 week.  LOS: 11 days    Abdul Beirne A 03/14/2015

## 2015-03-15 ENCOUNTER — Non-Acute Institutional Stay (SKILLED_NURSING_FACILITY): Payer: Medicare Other | Admitting: Internal Medicine

## 2015-03-15 ENCOUNTER — Other Ambulatory Visit: Payer: Self-pay | Admitting: *Deleted

## 2015-03-15 DIAGNOSIS — I480 Paroxysmal atrial fibrillation: Secondary | ICD-10-CM

## 2015-03-15 DIAGNOSIS — K631 Perforation of intestine (nontraumatic): Secondary | ICD-10-CM

## 2015-03-15 DIAGNOSIS — I2510 Atherosclerotic heart disease of native coronary artery without angina pectoris: Secondary | ICD-10-CM | POA: Diagnosis not present

## 2015-03-15 DIAGNOSIS — K5732 Diverticulitis of large intestine without perforation or abscess without bleeding: Secondary | ICD-10-CM | POA: Diagnosis not present

## 2015-03-15 DIAGNOSIS — E876 Hypokalemia: Secondary | ICD-10-CM

## 2015-03-15 LAB — CULTURE, BLOOD (ROUTINE X 2)
Culture: NO GROWTH
Culture: NO GROWTH

## 2015-03-15 MED ORDER — OXYCODONE-ACETAMINOPHEN 5-325 MG PO TABS: 1.0000 | ORAL_TABLET | Freq: Four times a day (QID) | ORAL | Status: DC | PRN

## 2015-03-15 MED ORDER — ALPRAZOLAM 0.25 MG PO TABS: 0.2500 mg | ORAL_TABLET | Freq: Two times a day (BID) | ORAL | Status: DC | PRN

## 2015-03-15 NOTE — Progress Notes (Signed)
Patient ID: [name], female   DOB: May 10, 1934, 80 y.o.   MRN: [id number]    Facility; [facility] Chief complaint; admission to SNF post admit to [facility] from 12/1 to 03/14/2015  History;  this is a 80 year old woman who developed the acute onset of left lower quadrant pain. CT scan of the abdomen done in the emergency room showed a perforated bowel. She was taken to the operating room for a partial colectomy on 12/4. I think this was felt secondary to a ruptured diverticulum associated with diverticulitis abscess and acute cervicitis. She has a colostomy with a Hartmann procedure. A repeat CT scan on 12/9 done secondary to worsening leukocytosis suggestive of abscess although after discussion with surgery the feeling was that this was mostly postoperative change. She also had postoperative poorly controlled atrial fibrillation. She converted back to sinus rhythm with rate control. It was recommended she continue on anticoagulation. His charge hemoglobin was 8.9 after transfusion of one unit. She had hypokalemia and hypophosphatemia both of which were replaced.   BMP Latest Ref Rng 03/13/2015 03/12/2015 03/11/2015  Glucose 65 - 99 mg/dL 103(H) 98 93  BUN 6 - 20 mg/dL 6 7 8   Creatinine 0.44 - 1.00 mg/dL 0.81 0.78 0.81  Sodium 135 - 145 mmol/L 135 134(L) 132(L)  Potassium 3.5 - 5.1 mmol/L 3.5 3.7 3.4(L)  Chloride 101 - 111 mmol/L 106 105 101  CO2 22 - 32 mmol/L 23 22 22   Calcium 8.9 - 10.3 mg/dL 7.6(L) 7.6(L) 7.3(L)   CBC Latest Ref Rng 03/14/2015 03/13/2015 03/12/2015  WBC 4.0 - 10.5 K/uL 9.4 12.6(H) 14.2(H)  Hemoglobin 12.0 - 15.0 g/dL 8.9(L) 9.3(L) 9.9(L)  Hematocrit 36.0 - 46.0 % 26.9(L) 27.5(L) 29.6(L)  Platelets 150 - 400 K/uL 359 349 338    Past Medical History  Diagnosis Date  . Depressive disorder, not elsewhere classified   . Anxiety state, unspecified   . Pure hypercholesterolemia   . Esophageal reflux   . Infection of esophagostomy ([place])   . Congestive heart failure,  unspecified     diastolic heart failure  . Pulmonary hypertension (HCC)     PA systolic pressure [id number] mmHg by echocardiogram, PA pressure 33/10 by cardiac catheterization PA saturation 62% thermodilution cardiac index 2.0 thick cardiac index 2.4  . Bradycardia     previously with coreg  . Tricuspid regurgitation     moderate tricuspid regurgitation by echocardiogram, prior use of anorexic agents  . Melanoma ([place]) 1995    removal on back   . Coronary artery disease     a. Moderate to severe coronary disease involving the left anterior descending artery and right coronary artery.  Sequential stenosis in the LAD is significant.  Right coronary artery is moderate to severely likely nonischemic. Questionable small LVOT obstruction.  No Brockenbrough maneuver was performed.  Catheterization January 2013; b. 05/2014 MV no isch/infarct, EF 60%.  Marland Kitchen PONV (postoperative nausea and vomiting)   . Hypertension   . H/O hiatal hernia   . Gastritis   . [name]'s esophagus   . IBS (irritable bowel syndrome)   . Macular degeneration   . Dysrhythmia    Past Surgical History  Procedure Laterality Date  . Nissen fundoplication  [id number]  . Rotator cuff repair  ~ 2001    right  . Coronary angioplasty with stent placement  05/08/11    "1"  . Tonsillectomy  ~ 1944  . Shoulder arthroscopy  ~ 2004; 2005    left; "joint's wore out"  .  Cataract extraction w/ intraocular lens  implant, bilateral  ~ 2002  . Tubal ligation  1972  . Percutaneous coronary stent intervention (pci-s) N/A 05/08/2011    Procedure: PERCUTANEOUS CORONARY STENT INTERVENTION (PCI-S);  Surgeon: Wellington Hampshire, MD;  Location: [facility] CATH LAB;  Service: Cardiovascular;  Laterality: N/A;  . Esophageal manometry N/A 06/06/2014    Procedure: ESOPHAGEAL MANOMETRY (EM);  Surgeon: Jerene Bears, MD;  Location: [facility] ENDOSCOPY;  Service: Gastroenterology;  Laterality: N/A;  . Laparoscopic nissen fundoplication N/A [id number]    Procedure: LAPAROSCOPIC  LYSIS OF  ADHESIONS, SEGMENTAL GASTRECTOMY, PARTIAL REDUCTION OF HERNIA;  Surgeon: [name], MD;  Location: [facility] ORS;  Service: General;  Laterality: N/A;  . Hernia repair    . Colectomy with colostomy creation/hartmann procedure N/A 03/05/2015    Procedure: PARTIAL COLECTOMY WITH COLOSTOMY CREATION/HARTMANN PROCEDURE;  Surgeon: Aviva Signs, MD;  Location: [facility] ORS;  Service: General;  Laterality: N/A;  . Cardiac catheterization Left 03/05/2015    Procedure: CENTRAL LINE INSERTION;  Surgeon: Aviva Signs, MD;  Location: [facility] ORS;  Service: General;  Laterality: Left;   Current Outpatient Prescriptions on File Prior to Visit  Medication Sig Dispense Refill  . albuterol (PROAIR HFA) 108 (90 BASE) MCG/ACT inhaler Inhale 2 puffs into the lungs every 6 (six) hours as needed for shortness of breath.     . ALPRAZolam (XANAX) 0.25 MG tablet Take 1 tablet (0.25 mg total) by mouth 2 (two) times daily as needed for anxiety. 30 tablet 0  . amLODipine (NORVASC) 5 MG tablet Take 1 tablet (5 mg total) by mouth daily.    Marland Kitchen amoxicillin-clavulanate (AUGMENTIN) 875-125 MG tablet Take 1 tablet by mouth 2 (two) times daily. Take for 5 days then stop. 10 tablet 0  . apixaban (ELIQUIS) 5 MG TABS tablet Take 1 tablet (5 mg total) by mouth 2 (two) times daily. 60 tablet   . citalopram (CELEXA) 40 MG tablet Take 0.5 tablets (20 mg total) by mouth daily. (Patient taking differently: Take 20 mg by mouth at bedtime. )    . esomeprazole (NEXIUM) 40 MG capsule Take 20 mg by mouth daily before breakfast.     . feeding supplement, ENSURE ENLIVE, (ENSURE ENLIVE) LIQD Take 237 mLs by mouth 2 (two) times daily between meals. 237 mL 12  . ferrous sulfate 325 (65 FE) MG EC tablet Take 325 mg by mouth daily with breakfast.      . losartan (COZAAR) 100 MG tablet Take 1 tablet (100 mg total) by mouth daily. 30 tablet 6  . metoprolol tartrate (LOPRESSOR) 25 MG tablet Take 1 tablet (25 mg total) by mouth 2 (two) times daily.    . Multiple  Vitamins-Minerals (PRESERVISION AREDS) TABS Take 1 tablet by mouth 2 (two) times daily.     . nitroGLYCERIN (NITROSTAT) 0.4 MG SL tablet Place 0.4 mg under the tongue every 5 (five) minutes as needed for chest pain.    Marland Kitchen ondansetron (ZOFRAN ODT) 4 MG disintegrating tablet 4mg  ODT q4 hours prn nausea/vomit (Patient taking differently: Take 4 mg by mouth every 4 (four) hours as needed for nausea or vomiting. 4mg  ODT q4 hours prn nausea/vomit) 12 tablet 0  . oxyCODONE-acetaminophen (PERCOCET/ROXICET) 5-325 MG tablet Take 1 tablet by mouth every 6 (six) hours as needed. 20 tablet 0  . potassium & sodium phosphates (PHOS-NAK) 280-160-250 MG PACK Take 1 packet by mouth 4 (four) times daily -  with meals and at bedtime. Take for 2 weeks then stop 120 packet   .  Propylene Glycol (SYSTANE BALANCE OP) Place 1-2 drops into both eyes daily as needed (dry eyes).    . rosuvastatin (CRESTOR) 10 MG tablet Take 10 mg by mouth at bedtime.     . traZODone (DESYREL) 100 MG tablet Take 100 mg by mouth at bedtime.     . Vitamin D, Ergocalciferol, (DRISDOL) 50000 UNITS CAPS Take 50,000 Units by mouth every 7 (seven) days. Patient takes on Tuesdays      Social history; patient lives in [place] and on her own but apparently has surrounding family members. She states she is independent with ADLs and IDL's. She is more than willing to learn her ostomy care but states she'll have surrounding family to help her  reports that she quit smoking about 26 years ago. Her smoking use included Cigarettes. She started smoking about 56 years ago. She has a 20 pack-year smoking history. She has never used smokeless tobacco. She reports that she does not drink alcohol or use illicit drugs.  Family History  Problem Relation Age of Onset  . Heart attack Father   . Hypertension Father   . Diabetes Mother   . Hypertension Mother   . Colon cancer Neg Hx   . Esophageal cancer Neg Hx   . Rectal cancer Neg Hx   . Stomach cancer Neg Hx   . Skin  cancer Brother     melanoma    Review of systems; Respiratory no shortness breath Cardiac she has a stent in place but does not describe any exertional chest pain. No current palpitations GI; no odynophagia no dysphagia no abdominal pain other than recent surgery GU no dysuria Endocrine no history of diabetes Neurologic; says her strength is good she is already been up walking.  Physical examination Gen. the patient looks remarkably well Vitals O2 sat 98% on room air pulse 96 respirations 18 and unlabored HEENT oral exam is normal Lymph nonpalpable in the cervical clavicular or axillary areas. Respiratory; clear air entry bilaterally no wheezing Cardiac soft systolic murmur no radiation no S3 she appears to be euvolemic Abdomen; colostomy in the left lower quadrant with greenish stool. Her surgical incision have staples. Bowel sounds are positive her abdominal examination is benign no palpable liver or spleen GU bladder is not distended there is no CVA tenderness. Extremities; minimal edema no evidence of a DVT Neurologic good strength in the lower extremities  Impression/plan #1 status post partial colectomy with colostomy secondary to a diverticular perforation with diverticulitis. She seems remarkably stable at the moment I will repeat her white count although this is already been trending down #2 atrial fibrillation converted into sinus rhythm now on any regulation will need to continue indefinitely. #3 history of coronary artery disease with a stent was no evidence at the bedside that this is active #4 postoperative anemia she left the hospital with a hemoglobin of 8.9 post 1 unit transfusion #5 hypokalemia and hypophosphatemia both of these have been replaced I will recheck. #6 history of severe gastroesophageal reflux with hiatal hernia. She had a laparoscopic surgical correction earlier this year. She has no current issues. #7 finding of a mass in her ascending colon I think  this was on her second CT scan of the abdomen a 2.6 cm mass involving the ascending colon. She will need to see GI and likely have a colonoscopy through the ostomy site  The patient looks remarkably well her hemoglobin, electrolytes will be repeated. We'll begin ostomy teaching.

## 2015-03-20 ENCOUNTER — Encounter (HOSPITAL_COMMUNITY)
Admission: RE | Admit: 2015-03-20 | Discharge: 2015-03-20 | Disposition: A | Discharge status: Home / Self Care | Payer: Medicare Other | Source: Skilled Nursing Facility | Attending: Internal Medicine | Admitting: Internal Medicine

## 2015-03-20 LAB — CBC WITH DIFFERENTIAL/PLATELET
BASOS PCT: 0 %
Basophils Absolute: 0 10*3/uL (ref 0.0–0.1)
EOS ABS: 0.1 10*3/uL (ref 0.0–0.7)
EOS PCT: 1 %
HCT: 31.1 % — ABNORMAL LOW (ref 36.0–46.0)
Hemoglobin: 10 g/dL — ABNORMAL LOW (ref 12.0–15.0)
LYMPHS ABS: 1.3 10*3/uL (ref 0.7–4.0)
Lymphocytes Relative: 14 %
MCH: 26.7 pg (ref 26.0–34.0)
MCHC: 32.2 g/dL (ref 30.0–36.0)
MCV: 83.2 fL (ref 78.0–100.0)
MONO ABS: 0.7 10*3/uL (ref 0.1–1.0)
MONOS PCT: 8 %
NEUTROS PCT: 77 %
Neutro Abs: 6.9 10*3/uL (ref 1.7–7.7)
Platelets: 350 10*3/uL (ref 150–400)
RBC: 3.74 MIL/uL — ABNORMAL LOW (ref 3.87–5.11)
RDW: 15.4 % (ref 11.5–15.5)
WBC: 9 10*3/uL (ref 4.0–10.5)

## 2015-03-20 LAB — COMPREHENSIVE METABOLIC PANEL
ALBUMIN: 2.6 g/dL — AB (ref 3.5–5.0)
ALK PHOS: 57 U/L (ref 38–126)
ALT: 10 U/L — ABNORMAL LOW (ref 14–54)
ANION GAP: 6 (ref 5–15)
AST: 13 U/L — ABNORMAL LOW (ref 15–41)
BUN: 6 mg/dL (ref 6–20)
CALCIUM: 8.6 mg/dL — AB (ref 8.9–10.3)
CO2: 27 mmol/L (ref 22–32)
Chloride: 101 mmol/L (ref 101–111)
Creatinine, Ser: 0.89 mg/dL (ref 0.44–1.00)
GFR calc non Af Amer: >60 mL/min (ref >60)
GLUCOSE: 94 mg/dL (ref 65–99)
POTASSIUM: 4 mmol/L (ref 3.5–5.1)
SODIUM: 134 mmol/L — AB (ref 135–145)
TOTAL PROTEIN: 6 g/dL — AB (ref 6.5–8.1)
Total Bilirubin: 0.5 mg/dL (ref 0.3–1.2)

## 2015-03-20 LAB — PHOSPHORUS: Phosphorus: 3.2 mg/dL (ref 2.5–4.6)

## 2015-03-22 ENCOUNTER — Encounter: Payer: Self-pay | Admitting: Internal Medicine

## 2015-03-22 ENCOUNTER — Non-Acute Institutional Stay (SKILLED_NURSING_FACILITY): Payer: Medicare Other | Admitting: Internal Medicine

## 2015-03-22 DIAGNOSIS — I10 Essential (primary) hypertension: Secondary | ICD-10-CM | POA: Diagnosis not present

## 2015-03-22 DIAGNOSIS — K219 Gastro-esophageal reflux disease without esophagitis: Secondary | ICD-10-CM

## 2015-03-22 DIAGNOSIS — I951 Orthostatic hypotension: Secondary | ICD-10-CM

## 2015-03-22 DIAGNOSIS — E876 Hypokalemia: Secondary | ICD-10-CM | POA: Diagnosis not present

## 2015-03-22 DIAGNOSIS — I4891 Unspecified atrial fibrillation: Secondary | ICD-10-CM | POA: Diagnosis not present

## 2015-03-22 DIAGNOSIS — K631 Perforation of intestine (nontraumatic): Secondary | ICD-10-CM | POA: Diagnosis not present

## 2015-03-22 DIAGNOSIS — I959 Hypotension, unspecified: Secondary | ICD-10-CM | POA: Insufficient documentation

## 2015-03-22 NOTE — Progress Notes (Signed)
Patient ID: [name], female   DOB: 11/01/1934, 80 y.o.   MRN: [id number]     This is an acute visit Facility; [facility]  Chief complaint; --acute visit secondary to question orthostatic hypotension  HPi;  this is a 80 year old woman who developed the acute onset of left lower quadrant pain. CT scan of the abdomen done in the emergency room showed a perforated bowel. She was taken to the operating room for a partial colectomy on 12/4. I think this was felt secondary to a ruptured diverticulum associated with diverticulitis abscess and acute cervicitis. She has a colostomy with a Hartmann procedure. A repeat CT scan on 12/9 done secondary to worsening leukocytosis suggestive of abscess although after discussion with surgery the feeling was that this was mostly postoperative change. She also had postoperative poorly controlled atrial fibrillation. She converted back to sinus rhythm with rate control. It was recommended she continue on anticoagulation. His charge hemoglobin was 8.9 after transfusion of one unit--this has since risen to 10.0. She had hypokalemia and hypophosphatemia both of which were replaced--most recent potassium 4.0 on lab done December 19.  Per nursing she is doing very well-apparently yesterday during therapy she did complain of a very short episode of dizziness blood pressures were taken which showed possibly some orthostasis-standing her blood pressure was 100/59-sitting 127/79-- She is on Losartan 100 mg a day Lopressor 25 twice a day as well as Norvasc 5 mg a day We did take it manually today lying her blood pressure was 130/70 this actually was her blood pressure was sitting and standing as well.  She complained of some weakness when she stood but did not specifically complain of any dizziness shortness of breath or chest pain.  Cardiac exam was unchanged largely regular with occasional irregular beats  Labs.  03/20/2015.  Sodium 134 potassium 4 BUN 6 creatinine  0.89.  Albumin 2.6 AST 13 ALT 10 otherwise liver function tests within normal limits phosphorus 3.2.  WBC 9.0 hemoglobin 10.0 platelets 350.   BMP Latest Ref Rng 03/13/2015 03/12/2015 03/11/2015  Glucose 65 - 99 mg/dL 103(H) 98 93  BUN 6 - 20 mg/dL 6 7 8   Creatinine 0.44 - 1.00 mg/dL 0.81 0.78 0.81  Sodium 135 - 145 mmol/L 135 134(L) 132(L)  Potassium 3.5 - 5.1 mmol/L 3.5 3.7 3.4(L)  Chloride 101 - 111 mmol/L 106 105 101  CO2 22 - 32 mmol/L 23 22 22   Calcium 8.9 - 10.3 mg/dL 7.6(L) 7.6(L) 7.3(L)   CBC Latest Ref Rng 03/14/2015 03/13/2015 03/12/2015  WBC 4.0 - 10.5 K/uL 9.4 12.6(H) 14.2(H)  Hemoglobin 12.0 - 15.0 g/dL 8.9(L) 9.3(L) 9.9(L)  Hematocrit 36.0 - 46.0 % 26.9(L) 27.5(L) 29.6(L)  Platelets 150 - 400 K/uL 359 349 338    Past Medical History  Diagnosis Date  . Depressive disorder, not elsewhere classified   . Anxiety state, unspecified   . Pure hypercholesterolemia   . Esophageal reflux   . Infection of esophagostomy ([place])   . Congestive heart failure, unspecified     diastolic heart failure  . Pulmonary hypertension (HCC)     PA systolic pressure [id number] mmHg by echocardiogram, PA pressure 33/10 by cardiac catheterization PA saturation 62% thermodilution cardiac index 2.0 thick cardiac index 2.4  . Bradycardia     previously with coreg  . Tricuspid regurgitation     moderate tricuspid regurgitation by echocardiogram, prior use of anorexic agents  . Melanoma ([place]) 1995    removal on back   . Coronary  artery disease     a. Moderate to severe coronary disease involving the left anterior descending artery and right coronary artery.  Sequential stenosis in the LAD is significant.  Right coronary artery is moderate to severely likely nonischemic. Questionable small LVOT obstruction.  No Brockenbrough maneuver was performed.  Catheterization January 2013; b. 05/2014 MV no isch/infarct, EF 60%.  Marland Kitchen PONV (postoperative nausea and vomiting)   . Hypertension   . H/O hiatal hernia    . Gastritis   . [name]'s esophagus   . IBS (irritable bowel syndrome)   . Macular degeneration   . Dysrhythmia    Past Surgical History  Procedure Laterality Date  . Nissen fundoplication  99991111  . Rotator cuff repair  ~ 2001    right  . Coronary angioplasty with stent placement  05/08/11    "1"  . Tonsillectomy  ~ 1944  . Shoulder arthroscopy  ~ 2004; 2005    left; "joint's wore out"  . Cataract extraction w/ intraocular lens  implant, bilateral  ~ 2002  . Tubal ligation  1972  . Percutaneous coronary stent intervention (pci-s) N/A 05/08/2011    Procedure: PERCUTANEOUS CORONARY STENT INTERVENTION (PCI-S);  Surgeon: Wellington Hampshire, MD;  Location: [facility] CATH LAB;  Service: Cardiovascular;  Laterality: N/A;  . Esophageal manometry N/A 06/06/2014    Procedure: ESOPHAGEAL MANOMETRY (EM);  Surgeon: Jerene Bears, MD;  Location: [facility] ENDOSCOPY;  Service: Gastroenterology;  Laterality: N/A;  . Laparoscopic nissen fundoplication N/A [id number]    Procedure: LAPAROSCOPIC  LYSIS OF ADHESIONS, SEGMENTAL GASTRECTOMY, PARTIAL REDUCTION OF HERNIA;  Surgeon: [name], MD;  Location: [facility] ORS;  Service: General;  Laterality: N/A;  . Hernia repair    . Colectomy with colostomy creation/hartmann procedure N/A 03/05/2015    Procedure: PARTIAL COLECTOMY WITH COLOSTOMY CREATION/HARTMANN PROCEDURE;  Surgeon: Aviva Signs, MD;  Location: [facility] ORS;  Service: General;  Laterality: N/A;  . Cardiac catheterization Left 03/05/2015    Procedure: CENTRAL LINE INSERTION;  Surgeon: Aviva Signs, MD;  Location: [facility] ORS;  Service: General;  Laterality: Left;   Current Outpatient Prescriptions on File Prior to Visit  Medication Sig Dispense Refill  . albuterol (PROAIR HFA) 108 (90 BASE) MCG/ACT inhaler Inhale 2 puffs into the lungs every 6 (six) hours as needed for shortness of breath.     . ALPRAZolam (XANAX) 0.25 MG tablet Take 1 tablet (0.25 mg total) by mouth 2 (two) times daily as needed for anxiety. 30 tablet 0  .  amLODipine (NORVASC) 5 MG tablet Take 1 tablet (5 mg total) by mouth daily.    Marland Kitchen      0  . apixaban (ELIQUIS) 5 MG TABS tablet Take 1 tablet (5 mg total) by mouth 2 (two) times daily. 60 tablet   . citalopram (CELEXA) 40 MG tablet Take 0.5 tablets (20 mg total) by mouth daily. (Patient taking differently: Take 20 mg by mouth at bedtime. )    . esomeprazole (NEXIUM) 40 MG capsule Take 20 mg by mouth daily before breakfast.     . feeding supplement, ENSURE ENLIVE, (ENSURE ENLIVE) LIQD Take 237 mLs by mouth 2 (two) times daily between meals. 237 mL 12  . ferrous sulfate 325 (65 FE) MG EC tablet Take 325 mg by mouth daily with breakfast.      . losartan (COZAAR) 100 MG tablet Take 1 tablet (100 mg total) by mouth daily. 30 tablet 6  . metoprolol tartrate (LOPRESSOR) 25 MG tablet Take 1 tablet (25 mg total) by  mouth 2 (two) times daily.    . Multiple Vitamins-Minerals (PRESERVISION AREDS) TABS Take 1 tablet by mouth 2 (two) times daily.     . nitroGLYCERIN (NITROSTAT) 0.4 MG SL tablet Place 0.4 mg under the tongue every 5 (five) minutes as needed for chest pain.    Marland Kitchen ondansetron (ZOFRAN ODT) 4 MG disintegrating tablet 4mg  ODT q4 hours prn nausea/vomit (Patient taking differently: Take 4 mg by mouth every 4 (four) hours as needed for nausea or vomiting. 4mg  ODT q4 hours prn nausea/vomit) 12 tablet 0  . oxyCODONE-acetaminophen (PERCOCET/ROXICET) 5-325 MG tablet Take 1 tablet by mouth every 6 (six) hours as needed. 20 tablet 0  . potassium & sodium phosphates (PHOS-NAK) 280-160-250 MG PACK Take 1 packet by mouth 4 (four) times daily -  with meals and at bedtime. Take for 2 weeks then stop 120 packet   . Propylene Glycol (SYSTANE BALANCE OP) Place 1-2 drops into both eyes daily as needed (dry eyes).    . rosuvastatin (CRESTOR) 10 MG tablet Take 10 mg by mouth at bedtime.     . traZODone (DESYREL) 100 MG tablet Take 100 mg by mouth at bedtime.     . Vitamin D, Ergocalciferol, (DRISDOL) 50000 UNITS CAPS Take  50,000 Units by mouth every 7 (seven) days. Patient takes on Tuesdays      Social history; patient lives in [place] and on her own but apparently has surrounding family members. She states she is independent with ADLs and IDL's. She is more than willing to learn her ostomy care but states she'll have surrounding family to help her  reports that she quit smoking about 26 years ago. Her smoking use included Cigarettes. She started smoking about 56 years ago. She has a 20 pack-year smoking history. She has never used smokeless tobacco. She reports that she does not drink alcohol or use illicit drugs.  Family History  Problem Relation Age of Onset  . Heart attack Father   . Hypertension Father   . Diabetes Mother   . Hypertension Mother   . Colon cancer Neg Hx   . Esophageal cancer Neg Hx   . Rectal cancer Neg Hx   . Stomach cancer Neg Hx   . Skin cancer Brother     melanoma    Review of systems; General does not complaining fever or chills appetite still continues be somewhat poor.  Skin does not complain of rashes or itching.  Head ears eyes nose mouth and throat does not complain of visual changes or sore throat Respiratory no shortness breath or cough Cardiac she has a stent in place but does not describe any exertional chest pain. No current palpitations GI; no odynophagia no dysphagia no abdominal pain other than recent surgery GU no dysuria Endocrine no history of diabetes Neurologic; does not complain of headaches or syncope-again does have a short episode of dizziness therapy yesterday apparently this did not occur today with therapy.  Physical examination She is afebrile pulse is 68 respirations 16 blood pressure as noted above 130/70 this appeared be fairly consistent lying sitting and standing this afternoon Gen. the patient looks frail but well   HEENT oral exam is normal   Respiratory; clear air entry bilaterally no wheezing Cardiac soft systolic murmur no radiation  no S3 she appears to be euvolemi--could not appreciate a carotid bruit does not appear to have significant lower extremity edema c Abdomen; colostomy in the left lower quadrant with  stool. Her surgical incision have steristips. Bowel  sounds are positive her abdominal examination is benign--some mild tenderness to palpation but this appears to be more secondary to the surgical incisions and not acute pain. Extremities; minimal edema moves all screws 4 is able to stand without assistance Neurologic good strength in the lower extremities--I do not see any lateralizing findings her speech is clear Psych she alert and oriented pleasant and appropriate  Impression/plan #1-hypotension-question orthostatic-clinically she appears to be stable orthostatic readings as noted above appear to be stable however apparently yesterday did have some orthostasis it appears-she is on numerous blood pressure agents including low dose Norvasc at this point we'll discontinue the Norvasc continue her beta blocker with history of atrial fibrillation as well as losartan-monitor orthostatic blood pressures daily with a log in provider book for review also monitor clinical status also how she does with therapy apparently today went fairly well   2 status post partial colectomy with colostomy secondary to a diverticular perforation with diverticulitis. She seems to be doing well in this regard her white count has now normalized at 9.0 #3 atrial fibrillation converted into sinus rhythm she continues on Lopressor 25 mg twice a day this appears rate controlled-patient states she's not tolerated a beta blocker in the past although appears she's been on Lopressor now for some time and doing well at this point will monitor pulse appears to be controlled.--continues on Eliquis for anticoagulation #4 history of coronary artery disease with a stent was no evidence at the bedside that this is active--she is on a statin as well as Eliquist  for anticoagulation #5 postoperative anemia sh ise left the hospital with a hemoglobin of 8.9 post 1 unit transfusion--this is now gradually risen up to 10--she is on iron #6 hypokalemia and hypophosphatemia both of these have been replaced lab done December 19 potassium is now 4.0 phosphorus is normal at 3.2.--She continues on Phos-NaK--will recheck this later this week-- #7 history of severe gastroesophageal reflux with hiatal hernia. She had a laparoscopic surgical correction earlier this year. She has no current issues.--She continues on Nexium #8 finding of a mass in her ascending colon --appears this was her her second CT scan of the abdomen a 2.6 cm mass involving the ascending colon. She will need to see GI and likely have a colonoscopy through the ostomy site  [id number]-of note greater than 35 minutes spent assessing patient-reviewing her chart-discussing her status with nursing staff-and coordinating and formulating a plan of care for numerous diagnoses-of note greater than 50% of time spent coordinating plan of care  .

## 2015-03-24 ENCOUNTER — Encounter: Payer: Self-pay | Admitting: Internal Medicine

## 2015-03-24 ENCOUNTER — Encounter (HOSPITAL_COMMUNITY)
Admission: AD | Admit: 2015-03-24 | Discharge: 2015-03-24 | Disposition: A | Discharge status: Home / Self Care | Payer: Medicare Other | Source: Skilled Nursing Facility | Attending: Internal Medicine | Admitting: Internal Medicine

## 2015-03-24 ENCOUNTER — Non-Acute Institutional Stay (SKILLED_NURSING_FACILITY): Payer: Medicare Other | Admitting: Internal Medicine

## 2015-03-24 DIAGNOSIS — I4891 Unspecified atrial fibrillation: Secondary | ICD-10-CM

## 2015-03-24 DIAGNOSIS — K631 Perforation of intestine (nontraumatic): Secondary | ICD-10-CM | POA: Diagnosis not present

## 2015-03-24 DIAGNOSIS — I519 Heart disease, unspecified: Secondary | ICD-10-CM

## 2015-03-24 DIAGNOSIS — N183 Chronic kidney disease, stage 3 unspecified: Secondary | ICD-10-CM

## 2015-03-24 LAB — BASIC METABOLIC PANEL
Anion gap: 8 (ref 5–15)
BUN: 14 mg/dL (ref 6–20)
CALCIUM: 7.9 mg/dL — AB (ref 8.9–10.3)
CO2: 25 mmol/L (ref 22–32)
CREATININE: 0.99 mg/dL (ref 0.44–1.00)
Chloride: 98 mmol/L — ABNORMAL LOW (ref 101–111)
GFR calc Af Amer: >60 mL/min (ref >60)
GFR, EST NON AFRICAN AMERICAN: 53 mL/min — AB (ref >60)
GLUCOSE: 93 mg/dL (ref 65–99)
Potassium: 3.7 mmol/L (ref 3.5–5.1)
Sodium: 131 mmol/L — ABNORMAL LOW (ref 135–145)

## 2015-03-24 NOTE — Progress Notes (Signed)
Patient ID: [name], female   DOB: Dec 05, 1934, 80 y.o.   MRN: [id number]      This is a discharge note Facility; [facility]  Chief complaint; -Discharge note  HPi;  this is a 80 year old woman who developed the acute onset of left lower quadrant pain. CT scan of the abdomen done in the emergency room showed a perforated bowel. She was taken to the operating room for a partial colectomy on 12/4. I think this was felt secondary to a ruptured diverticulum associated with diverticulitis abscess and acute cervicitis. She has a colostomy with a Hartmann procedure. A repeat CT scan on 12/9 done secondary to worsening leukocytosis suggestive of abscess although after discussion with surgery the feeling was that this was mostly postoperative change. She also had postoperative poorly controlled atrial fibrillation. She converted back to sinus rhythm with rate control. It was recommended she continue on anticoagulation. Dis charge hemoglobin was 8.9 after transfusion of one unit--this has since risen to 10.0. She had hypokalemia and hypophosphatemia both of which were replaced--most recent potassium 43.7 on lab done December 23  Per nursing she is doing very well-apparently several days ago during therapy she did complain of a very short episode of dizziness blood pressures were taken which showed possibly some orthostasis-standing her blood pressure was 100/59-sitting 127/79-- She is on Losartan 100 mg a day Lopressor 25 twice a day as well as Norvasc 5 mg a day We did take it manually earlier this week-- lying her blood pressure was 130/70 this actually was her blood pressure  sitting and standing as well--subsequent orthostatic readings also showed stability.  Today she has no complaints she is looking forward to going home she does live alone but has strong family support-she will need PT and OT for further strengthening as well as nursing support for her medical issues including the recent perforated  bowel-with a colostomy   Labs.  03/24/2015.  Sodium 131 potassium 3.7 BUN 14 creatinine 0.99.  Calcium 7.9  03/20/2015.  Sodium 134 potassium 4 BUN 6 creatinine 0.89.  Albumin 2.6 AST 13 ALT 10 otherwise liver function tests within normal limits phosphorus 3.2.  WBC 9.0 hemoglobin 10.0 platelets 350.   BMP Latest Ref Rng 03/13/2015 03/12/2015 03/11/2015  Glucose 65 - 99 mg/dL 103(H) 98 93  BUN 6 - 20 mg/dL 6 7 8   Creatinine 0.44 - 1.00 mg/dL 0.81 0.78 0.81  Sodium 135 - 145 mmol/L 135 134(L) 132(L)  Potassium 3.5 - 5.1 mmol/L 3.5 3.7 3.4(L)  Chloride 101 - 111 mmol/L 106 105 101  CO2 22 - 32 mmol/L 23 22 22   Calcium 8.9 - 10.3 mg/dL 7.6(L) 7.6(L) 7.3(L)   CBC Latest Ref Rng 03/14/2015 03/13/2015 03/12/2015  WBC 4.0 - 10.5 K/uL 9.4 12.6(H) 14.2(H)  Hemoglobin 12.0 - 15.0 g/dL 8.9(L) 9.3(L) 9.9(L)  Hematocrit 36.0 - 46.0 % 26.9(L) 27.5(L) 29.6(L)  Platelets 150 - 400 K/uL 359 349 338    Past Medical History  Diagnosis Date  . Depressive disorder, not elsewhere classified   . Anxiety state, unspecified   . Pure hypercholesterolemia   . Esophageal reflux   . Infection of esophagostomy ([place])   . Congestive heart failure, unspecified     diastolic heart failure  . Pulmonary hypertension (HCC)     PA systolic pressure [id number] mmHg by echocardiogram, PA pressure 33/10 by cardiac catheterization PA saturation 62% thermodilution cardiac index 2.0 thick cardiac index 2.4  . Bradycardia     previously with coreg  .  Tricuspid regurgitation     moderate tricuspid regurgitation by echocardiogram, prior use of anorexic agents  . Melanoma ([place]) 1995    removal on back   . Coronary artery disease     a. Moderate to severe coronary disease involving the left anterior descending artery and right coronary artery.  Sequential stenosis in the LAD is significant.  Right coronary artery is moderate to severely likely nonischemic. Questionable small LVOT obstruction.  No Brockenbrough  maneuver was performed.  Catheterization January 2013; b. 05/2014 MV no isch/infarct, EF 60%.  Marland Kitchen PONV (postoperative nausea and vomiting)   . Hypertension   . H/O hiatal hernia   . Gastritis   . [name]'s esophagus   . IBS (irritable bowel syndrome)   . Macular degeneration   . Dysrhythmia    Past Surgical History  Procedure Laterality Date  . Nissen fundoplication  [id number]  . Rotator cuff repair  ~ 2001    right  . Coronary angioplasty with stent placement  05/08/11    "1"  . Tonsillectomy  ~ 1944  . Shoulder arthroscopy  ~ 2004; 2005    left; "joint's wore out"  . Cataract extraction w/ intraocular lens  implant, bilateral  ~ 2002  . Tubal ligation  1972  . Percutaneous coronary stent intervention (pci-s) N/A 05/08/2011    Procedure: PERCUTANEOUS CORONARY STENT INTERVENTION (PCI-S);  Surgeon: Wellington Hampshire, MD;  Location: [facility] CATH LAB;  Service: Cardiovascular;  Laterality: N/A;  . Esophageal manometry N/A 06/06/2014    Procedure: ESOPHAGEAL MANOMETRY (EM);  Surgeon: Jerene Bears, MD;  Location: [facility] ENDOSCOPY;  Service: Gastroenterology;  Laterality: N/A;  . Laparoscopic nissen fundoplication N/A [id number]    Procedure: LAPAROSCOPIC  LYSIS OF ADHESIONS, SEGMENTAL GASTRECTOMY, PARTIAL REDUCTION OF HERNIA;  Surgeon: [name], MD;  Location: [facility] ORS;  Service: General;  Laterality: N/A;  . Hernia repair    . Colectomy with colostomy creation/hartmann procedure N/A 03/05/2015    Procedure: PARTIAL COLECTOMY WITH COLOSTOMY CREATION/HARTMANN PROCEDURE;  Surgeon: Aviva Signs, MD;  Location: [facility] ORS;  Service: General;  Laterality: N/A;  . Cardiac catheterization Left 03/05/2015    Procedure: CENTRAL LINE INSERTION;  Surgeon: Aviva Signs, MD;  Location: [facility] ORS;  Service: General;  Laterality: Left;   Current Outpatient Prescriptions on File Prior to Visit  Medication Sig Dispense Refill  . albuterol (PROAIR HFA) 108 (90 BASE) MCG/ACT inhaler Inhale 2 puffs into the lungs every 6 (six) hours  as needed for shortness of breath.     . ALPRAZolam (XANAX) 0.25 MG tablet Take 1 tablet (0.25 mg total) by mouth 2 (two) times daily as needed for anxiety. 30 tablet 0  . amLODipine (NORVASC) 5 MG tablet Take 1 tablet (5 mg total) by mouth daily.    Marland Kitchen      0  . apixaban (ELIQUIS) 5 MG TABS tablet Take 1 tablet (5 mg total) by mouth 2 (two) times daily. 60 tablet   . citalopram (CELEXA) 40 MG tablet Take 0.5 tablets (20 mg total) by mouth daily. (Patient taking differently: Take 20 mg by mouth at bedtime. )    . esomeprazole (NEXIUM) 40 MG capsule Take 20 mg by mouth daily before breakfast.     . feeding supplement, ENSURE ENLIVE, (ENSURE ENLIVE) LIQD Take 237 mLs by mouth 2 (two) times daily between meals. 237 mL 12  . ferrous sulfate 325 (65 FE) MG EC tablet Take 325 mg by mouth daily with breakfast.      . losartan (  COZAAR) 100 MG tablet Take 1 tablet (100 mg total) by mouth daily. 30 tablet 6  . metoprolol tartrate (LOPRESSOR) 25 MG tablet Take 1 tablet (25 mg total) by mouth 2 (two) times daily.    . Multiple Vitamins-Minerals (PRESERVISION AREDS) TABS Take 1 tablet by mouth 2 (two) times daily.     . nitroGLYCERIN (NITROSTAT) 0.4 MG SL tablet Place 0.4 mg under the tongue every 5 (five) minutes as needed for chest pain.    Marland Kitchen ondansetron (ZOFRAN ODT) 4 MG disintegrating tablet 4mg  ODT q4 hours prn nausea/vomit (Patient taking differently: Take 4 mg by mouth every 4 (four) hours as needed for nausea or vomiting. 4mg  ODT q4 hours prn nausea/vomit) 12 tablet 0  . oxyCODONE-acetaminophen (PERCOCET/ROXICET) 5-325 MG tablet Take 1 tablet by mouth every 6 (six) hours as needed. 20 tablet 0  . potassium & sodium phosphates (PHOS-NAK) 280-160-250 MG PACK Take 1 packet by mouth 4 (four) times daily -  with meals and at bedtime. Take for 2 weeks then stop 120 packet   . Propylene Glycol (SYSTANE BALANCE OP) Place 1-2 drops into both eyes daily as needed (dry eyes).    . rosuvastatin (CRESTOR) 10 MG  tablet Take 10 mg by mouth at bedtime.     . traZODone (DESYREL) 100 MG tablet Take 100 mg by mouth at bedtime.     . Vitamin D, Ergocalciferol, (DRISDOL) 50000 UNITS CAPS Take 50,000 Units by mouth every 7 (seven) days. Patient takes on Tuesdays      Social history; patient lives i on her own but apparently has surrounding family members. She states she is independent with ADLs and IDL's. She is more than willing to learn her ostomy care but states she'll have surrounding family to help her  reports that she quit smoking about 26 years ago. Her smoking use included Cigarettes. She started smoking about 56 years ago. She has a 20 pack-year smoking history. She has never used smokeless tobacco. She reports that she does not drink alcohol or use illicit drugs.  Family History  Problem Relation Age of Onset  . Heart attack Father   . Hypertension Father   . Diabetes Mother   . Hypertension Mother   . Colon cancer Neg Hx   . Esophageal cancer Neg Hx   . Rectal cancer Neg Hx   . Stomach cancer Neg Hx   . Skin cancer Brother     melanoma    Review of systems; General does not complaining fever or chills appetite still continues be somewhat poor.  Skin does not complain of rashes or itching.  Head ears eyes nose mouth and throat does not complain of visual changes or sore throat Respiratory no shortness breath or cough Cardiac she has a stent in place but does not describe any exertional chest pain. No current palpitations GI; no odynophagia no dysphagia no abdominal pain other than recent surgery GU no dysuria Endocrine no history of diabetes Neurologic; does not complain of headaches or syncope-again does have a short episode of dizziness during therapy several days ago there's been no reoccurrence.  Physical examination  She is afebrile pulse of 80 respirations 16 blood pressure 114/68 sitting-108/61 standing Gen. the patient looks frail but well Her skin is warm and dry   HEENT  oral exam is normal   Respiratory; clear air entry bilaterally no wheezing Cardiac soft systolic murmur no radiation no S3 she appears to be euvolemic- does not appear to have significant lower extremity  edema c Abdomen; colostomy in the left lower quadrant with  stool. Her surgical incision have steristips. Bowel sounds are positive her abdominal examination is benign--some mild tenderness to palpation but this appears to be more secondary to the surgical incisions and not acute pain. Extremities; minimal edema moves all ext 4 is able to stand without assistance and ambulate with her walker appears to be doing well with this Neurologic good strength in the lower extremities--I do not see any lateralizing findings her speech is clear Psych she alert and oriented pleasant and appropriate     Impression/plan #1  status post partial colectomy with colostomy secondary to a diverticular perforation with diverticulitis. She seems to be doing well in this regard --her white count has now normalized at 9.0-needs nursing support with ostomy care at home her family will be quite involved as well #2 atrial fibrillation converted into sinus rhythm she continues on Lopressor 25 mg twice a day this appears rate controlled-patient states she's not tolerated a beta blocker in the past although appears she's been on Lopressor now for some time and appears stable.--continues on Eliquis for anticoagulation #3 history of coronary artery disease with a stent was no evidence at the bedside that this is active--she is on a statin as well as Eliquist for anticoagulation #4 postoperative anemia sh ise left the hospital with a hemoglobin of 8.9 post 1 unit transfusion--this is now gradually risen up to 10--she is on iron #5 hypokalemia and hypophosphatemia both of these have been replaced lab done December 19 potassium is now 43.7phosphorus is normal at 3.2.--She continues on Phos-NaK--w #6 history of severe  gastroesophageal reflux with hiatal hernia. She had a laparoscopic surgical correction earlier this year. She has no current issues.--She continues on Nexium #7 finding of a mass in her ascending colon --appears this was her her second CT scan of the abdomen a 2.6 cm mass involving the ascending colon. She will need to see GI and likely have a colonoscopy through the ostomy site--GI follow-up is scheduled for 04/17/2015 #8 hypotensionn-at this point appears stable her Norvasc has been discontinued--she has a mild systolic drop onmost recent orthostatics but this does not appear very dramatic Would be hesitant to make medication adjustments right before discharge since she appears to be stable  [id number]-of note greater than 30 minutes spent on this discharge summary-greater than 50% of time spent coordinating plan of care for numerous diagnoses    .

## 2015-03-30 DIAGNOSIS — F419 Anxiety disorder, unspecified: Secondary | ICD-10-CM | POA: Diagnosis not present

## 2015-03-30 DIAGNOSIS — Z433 Encounter for attention to colostomy: Secondary | ICD-10-CM | POA: Diagnosis not present

## 2015-03-30 DIAGNOSIS — E7800 Pure hypercholesterolemia, unspecified: Secondary | ICD-10-CM | POA: Diagnosis not present

## 2015-03-30 DIAGNOSIS — N189 Chronic kidney disease, unspecified: Secondary | ICD-10-CM | POA: Diagnosis not present

## 2015-03-30 DIAGNOSIS — F329 Major depressive disorder, single episode, unspecified: Secondary | ICD-10-CM | POA: Diagnosis not present

## 2015-04-17 ENCOUNTER — Encounter: Payer: Self-pay | Admitting: *Deleted

## 2015-04-17 ENCOUNTER — Other Ambulatory Visit (INDEPENDENT_AMBULATORY_CARE_PROVIDER_SITE_OTHER): Payer: Medicare Other

## 2015-04-17 ENCOUNTER — Ambulatory Visit (INDEPENDENT_AMBULATORY_CARE_PROVIDER_SITE_OTHER): Payer: Medicare Other | Admitting: Internal Medicine

## 2015-04-17 VITALS — BP 146/52 | HR 60 | Ht 62.0 in | Wt 144.4 lb

## 2015-04-17 DIAGNOSIS — K449 Diaphragmatic hernia without obstruction or gangrene: Secondary | ICD-10-CM | POA: Diagnosis not present

## 2015-04-17 DIAGNOSIS — R933 Abnormal findings on diagnostic imaging of other parts of digestive tract: Secondary | ICD-10-CM | POA: Diagnosis not present

## 2015-04-17 DIAGNOSIS — K5900 Constipation, unspecified: Secondary | ICD-10-CM

## 2015-04-17 DIAGNOSIS — Z939 Artificial opening status, unspecified: Secondary | ICD-10-CM

## 2015-04-17 DIAGNOSIS — D509 Iron deficiency anemia, unspecified: Secondary | ICD-10-CM

## 2015-04-17 DIAGNOSIS — K5720 Diverticulitis of large intestine with perforation and abscess without bleeding: Secondary | ICD-10-CM

## 2015-04-17 DIAGNOSIS — Z87898 Personal history of other specified conditions: Secondary | ICD-10-CM

## 2015-04-17 DIAGNOSIS — K5730 Diverticulosis of large intestine without perforation or abscess without bleeding: Secondary | ICD-10-CM

## 2015-04-17 DIAGNOSIS — R5383 Other fatigue: Secondary | ICD-10-CM

## 2015-04-17 LAB — CBC WITH DIFFERENTIAL/PLATELET
BASOS ABS: 0 10*3/uL (ref 0.0–0.1)
Basophils Relative: 0.3 % (ref 0.0–3.0)
EOS ABS: 0 10*3/uL (ref 0.0–0.7)
Eosinophils Relative: 0.3 % (ref 0.0–5.0)
LYMPHS PCT: 37.2 % (ref 12.0–46.0)
Lymphs Abs: 4.7 10*3/uL — ABNORMAL HIGH (ref 0.7–4.0)
MCHC: 31 g/dL (ref 30.0–36.0)
MCV: 80.1 fl (ref 78.0–100.0)
Monocytes Absolute: 0.7 10*3/uL (ref 0.1–1.0)
Monocytes Relative: 5.7 % (ref 3.0–12.0)
Neutro Abs: 7.1 10*3/uL (ref 1.4–7.7)
Neutrophils Relative %: 56.5 % (ref 43.0–77.0)
PLATELETS: 312 10*3/uL (ref 150.0–400.0)
RBC: 2.98 Mil/uL — AB (ref 3.87–5.11)
RDW: 19 % — ABNORMAL HIGH (ref 11.5–15.5)
WBC: 12.6 10*3/uL — AB (ref 4.0–10.5)

## 2015-04-17 LAB — IBC PANEL
Iron: 19 ug/dL — ABNORMAL LOW (ref 42–145)
SATURATION RATIOS: 8.2 % — AB (ref 20.0–50.0)
TRANSFERRIN: 165 mg/dL — AB (ref 212.0–360.0)

## 2015-04-17 LAB — FERRITIN: FERRITIN: 62.2 ng/mL (ref 10.0–291.0)

## 2015-04-17 MED ORDER — POLYETHYLENE GLYCOL 3350 17 GM/SCOOP PO POWD: ORAL | Status: DC

## 2015-04-17 NOTE — Patient Instructions (Signed)
Please purchase the following medications over the counter and take as directed: Miralax twice daily  Your physician has requested that you go to the basement for the following lab work before leaving today: CBC, IBC, ferritin  Please make sure to eat small, frequent meals.  Follow up with Dr Hilarie Fredrickson on Wednesday, 06/14/15 @ 10:30 am.

## 2015-04-18 ENCOUNTER — Other Ambulatory Visit: Payer: Self-pay

## 2015-04-18 DIAGNOSIS — D509 Iron deficiency anemia, unspecified: Secondary | ICD-10-CM

## 2015-04-18 NOTE — Progress Notes (Signed)
Subjective:    Patient ID: [name], female    DOB: Jan 26, 1935, 81 y.o.   MRN: [id number]  HPI [name] is a 81 year old female with history of large hiatal hernia status post attempted hiatal hernia repair in June 2016 with partial reduction of the hernia, segmental gastrectomy and laparoscopic lysis of adhesions, recent sigmoid perforation from diverticulitis now with end colostomy/Hartmann's procedure with hospitalization in early December 2016 is here for follow-up. She reports while at home she developed severe back pain and also mid and lower abdominal pain. This had happened previously and she was seen after such an episode in November by Alonza Bogus, PA-C. On December 1 in the ER CT scan confirmed perforation and she went for surgery. She has recovered slowly and has been struggling with fatigue. She's also had constipation and hard stool passing through her stoma. Stool has been dark while on iron. There are times that she has to pull hard or solid stool from her stoma. She can go 4-5 days without having a bowel movement and she begins to feel nauseous and sick on her stomach. She had a recent fall at home and has sore ribs on the left for which she's been using Percocet very sparingly. She is also taking iron. She's been taking Nexium daily. She is also on Eliquis.  Review of Systems As per history of present illness, otherwise negative  Current Medications, Allergies, Past Medical History, Past Surgical History, Family History and Social History were reviewed in [employer] record.     Objective:   Physical Exam BP 146/52 mmHg  Pulse 60  Ht 5\' 2"  (1.575 m)  Wt 144 lb 6 oz (65.488 kg)  BMI 26.40 kg/m2 Constitutional: Chronically ill-appearing pale female in no acute distress  HEENT: Normocephalic and atraumatic. Oropharynx is clear and moist. No oropharyngeal exudate. Conjunctivae are pale.  No scleral icterus. Neck: Neck supple. Trachea  midline. Cardiovascular: Normal rate, regular rhythm and intact distal pulses. No M/R/G Pulmonary/chest: Effort normal and breath sounds normal. No wheezing, rales or rhonchi. Abdominal: Soft, nontender, nondistended. Bowel sounds active throughout.  colostomy left midabdomen with no stool in the bag  Extremities: no clubbing, cyanosis, or edema Neurological: Alert and oriented to person place and time. Skin: Skin is warm and dry. No rashes noted. Psychiatric: Normal mood and affect. Behavior is normal.  ADDENDUM: Findings and [place] reviewed with Dr. Arnoldo Morale. Left hemipelvis and posterior sacral retroperitoneal collection is mostly air. No significant fluid component for drain catheter. Second Left lower quadrant small fluid collection with a single focus of air, image 64 extends slightly midline over the bladder. This remains indeterminate. White count has decreased from from 18 to 15. Patient is only 6 days postop. Will hold on percutaneous drainage and consider repeat CT next week. Electronically Signed   By: Jerilynn Mages.  Shick M.D.   On: 03/11/2015 08:44       Study Result       CLINICAL DATA:  81 year old who underwent sigmoid colectomy and descending colostomy related to colonic perforation on 03/05/2015, now with worsening leukocytosis.   EXAM: CT ABDOMEN AND PELVIS WITH CONTRAST   TECHNIQUE: Multidetector CT imaging of the abdomen and pelvis was performed using the standard protocol following bolus administration of intravenous contrast.   CONTRAST:  179mL OMNIPAQUE IOHEXOL 300 MG/ML IV.   COMPARISON:  Preoperative CT abdomen and pelvis 03/03/2015, 02/10/2015.   FINDINGS: Lower chest: Small to moderate-sized right pleural effusion and associated minimal passive atelectasis  in the right lower lobe. Small left pleural effusion. Chronic elevation of the left hemidiaphragm with chronic scar/atelectasis in the left lower lobe. Heart moderately enlarged, particularly the left  atrium.   Hepatobiliary: Liver normal in size and appearance. Gallbladder unremarkable by CT. No biliary ductal dilation.   Pancreas: Severe diffuse atrophy. No focal pancreatic parenchymal abnormality.   Spleen: Normal in size and appearance. Surgical clips again noted adjacent to the medial spleen   Adrenals/Urinary Tract: Normal appearing adrenal glands. Mildly atrophic left kidney with diffuse cortical thinning. Nonobstructing approximate 5 mm calculus in a lower pole calix of the left kidney. No right urinary tract calculi. Cortical cysts involving both kidneys, the largest arising from the lower pole of the left kidney approximating 3.5 cm. No significant focal abnormality involving either kidney. Urinary bladder contains gas, presumably related to recent instrumentation, and is otherwise normal in appearance.   Stomach/Bowel: Surgical suture material involving the stomach related to prior Nissen fundoplication. Recurrent very large hiatal hernia with at least 2/3 of the stomach present in the chest, unchanged from the prior examinations. Distended loops of jejunum in the left upper quadrant of the abdomen, with normal caliber loops of distal jejunum and ileum. Cecum decompressed which I believe accounts for apparent wall thickening, positioned anteriorly in the upper pelvis. Approximate 2.6 cm mass in the ascending colon, obscured by stool on the preoperative CT. Satisfactory appearance of the descending colostomy.   Vascular/Lymphatic: Severe atherosclerosis involving the abdominal aorta and moderate iliofemoral atherosclerosis without aneurysm. No pathologic lymphadenopathy.   Reproductive: Atrophic uterus consistent with age. No adnexal masses.   Other: Adjacent fluid collections with enhancing wall, containing gas, in the right side of the mid and lower pelvis. The fluid collection more superiorly has adjacent phlegmonous change and residual extraluminal gas. In  total, the phlegmon and fluid collection measure approximately 4.2 x 6.6 x 4.5 cm. The fluid collection itself which contains a large amount of gas approximates 3.3 cm maximally.   The fluid collection more inferiorly in the pelvis is oval-shaped and extends into the low pelvis just above the urinary bladder. The collection is difficult accurately measure, though extends over an approximate 9-10 cm superoinferior length.   Phlegmonous change with associated extraluminal gas is present in the left side of the low pelvis extending into the presacral space, extending over an approximate 11 cm length. A surgical drain is in place in the low pelvis.   Musculoskeletal: Degenerative disc disease at L4-5 and L5-S1. Generalized osseous demineralization. Lower thoracic spondylosis.   IMPRESSION: 1. At least 3 abscesses in the left side of the pelvis as described above. Two of these collections have predominantly phlegmonous change with extraluminal gas. The largest fluid collection spans approximately 11 cm in length and would be amenable to percutaneous drainage. The other 2 predominantly phlegmonous collections would likely not be well treated by percutaneous drainage at this point. 2. Approximate 2.6 cm mass involving the ascending colon, best seen on the coronal reformatted images, worrisome for malignancy. This lesion was obscured by stool on the preoperative CT. 3. Dilated loops of jejunum in the left upper quadrant, more likely to represent postoperative ileus than obstruction, though followup abdominal x-rays may be helpful. 4. Bilateral pleural effusions, right greater than left, with associated mild passive atelectasis in the right lower lobe. 5. Gas within the urinary bladder, presumably related to recent instrumentation. If the patient has not had a Foley catheter placed recently, an enteric-vesical fistula is possible. 6. Numerous chronic findings  including large hiatal hernia,  severe pancreatic atrophy, mildly atrophic left kidney with a nonobstructing 5 mm lower pole calculus.   Electronically Signed: By: Evangeline Dakin M.D. On: 03/10/2015 21:08   Surgical report reviewed Pathology -- perforated sigmoid diverticulitis with abscess -- no evidence of malignancy    Assessment & Plan:   81 year old female with history of large hiatal hernia status post attempted hiatal hernia repair in June 2016 with partial reduction of the hernia, segmental gastrectomy and laparoscopic lysis of adhesions, recent sigmoid perforation from diverticulitis now with end colostomy/Hartmann's procedure with hospitalization in early December 2016 is here for follow-up.  1.  Perforated sigmoid diverticulitis -- she is having issues with constipation likely exacerbated by oral iron and also narcotic pain medication. Constipation has been an issue for her on a long-standing basis. I recommended MiraLAX 17 g  twice daily to help loosen stools and hopefully help her ostomy function more normally. I'll let her know it was okay to use 3 or 4 doses today to hopefully help start her bowel movements.   2.  Fatigue -- I'm concerned about anemia given her fatigue, recent surgery and her pale appearance today. CBC and iron studies today   3. Large hiatal hernia/attempted repair in June 2016  -- upper GI symptoms did improve, particularly respiratory compromise associated with eating, after surgery. She still has some reflux and I have recommended she remain on Nexium daily. Small more frequent meals. She understands this is not surgically repairable  4. Ascending colon mass seen by CT -- would like to get her bowels moving and she will need attempted colonoscopy through the stoma .Eliquis will need to be held under direction from her prescribing MD.   Hold Eliquis 5 days before procedure - will instruct when and how to resume after procedure. Risks and benefits of procedure including bleeding,  perforation, infection, missed lesions, medication reactions and possible hospitalization or surgery if complications occur explained. Additional rare but real risk of cardiovascular event such as heart attack or ischemia/infarct of other organs off Eliquis explained and need to seek urgent help if this occurs. Will communicate by phone or EMR with patient's prescribing provider that to confirm holding Eliquis is reasonable in this [place].   Return in 6-8 weeks 45 minutes spent with the patient today. Greater than 50% was spent in counseling and coordination of care with the patient   Addendum:  Hgb returned at 7.4 Lab Results  Component Value Date   HGB 7.4 cL* 04/17/2015   Iron/TIBC/Ferritin/ %Sat    Component Value Date/Time   IRON 19* 04/17/2015 1619   FERRITIN 62.2 04/17/2015 1619   IRONPCTSAT 8.2* 04/17/2015 1619   She is also iron deficient.   Increase ferrous sulfate to 325 mg TID and repeat CBC in 1 week Colonoscopy recommended as above, r/o bleeding from mass seen in ascending colon.

## 2015-04-19 ENCOUNTER — Other Ambulatory Visit: Payer: Self-pay

## 2015-04-19 DIAGNOSIS — D509 Iron deficiency anemia, unspecified: Secondary | ICD-10-CM

## 2015-04-21 ENCOUNTER — Telehealth: Payer: Self-pay | Admitting: *Deleted

## 2015-04-21 ENCOUNTER — Telehealth: Payer: Self-pay

## 2015-04-21 ENCOUNTER — Other Ambulatory Visit (INDEPENDENT_AMBULATORY_CARE_PROVIDER_SITE_OTHER): Payer: Medicare Other

## 2015-04-21 DIAGNOSIS — D649 Anemia, unspecified: Secondary | ICD-10-CM

## 2015-04-21 DIAGNOSIS — D509 Iron deficiency anemia, unspecified: Secondary | ICD-10-CM

## 2015-04-21 LAB — CBC WITH DIFFERENTIAL/PLATELET
BASOS PCT: 0.4 % (ref 0.0–3.0)
Basophils Absolute: 0.1 10*3/uL (ref 0.0–0.1)
EOS ABS: 0.1 10*3/uL (ref 0.0–0.7)
EOS PCT: 0.7 % (ref 0.0–5.0)
LYMPHS PCT: 34.7 % (ref 12.0–46.0)
Lymphs Abs: 4.3 10*3/uL — ABNORMAL HIGH (ref 0.7–4.0)
MCHC: 31.2 g/dL (ref 30.0–36.0)
MCV: 79.7 fl (ref 78.0–100.0)
MONO ABS: 0.7 10*3/uL (ref 0.1–1.0)
Monocytes Relative: 5.7 % (ref 3.0–12.0)
Neutro Abs: 7.3 10*3/uL (ref 1.4–7.7)
Neutrophils Relative %: 58.5 % (ref 43.0–77.0)
Platelets: 312 10*3/uL (ref 150.0–400.0)
RBC: 3.01 Mil/uL — AB (ref 3.87–5.11)
RDW: 18.9 % — AB (ref 11.5–15.5)
WBC: 12.5 10*3/uL — AB (ref 4.0–10.5)

## 2015-04-21 MED ORDER — NA SULFATE-K SULFATE-MG SULF 17.5-3.13-1.6 GM/177ML PO SOLN: ORAL | Status: DC

## 2015-04-21 NOTE — Telephone Encounter (Signed)
I have gotten a verbal order from Dr Octavio Graves (phone [phone]) to hold patient's Eliquis 5 days prior to scheduled colonoscopy on 04/26/15.

## 2015-04-21 NOTE — Telephone Encounter (Signed)
-----   Message from Jerene Bears, MD sent at 04/18/2015  5:49 PM EST ----- Regarding: colonoscopy Pt needs colonoscopy via stoma for mass suggested by CT in the ascending colon. Need permission to hold Eliquis from prescribing MD 1st Also need to get her to have BMs 1st before trying the prep. No BM since OV yesterday.  She had 2 doses of Miralax yesterday and 2 doses today. I recommended 2 more doses tonight and then continue 17 g BID tomorrow Recheck CBC Friday, if lower, I expect she will need admission for transfusion and possible colonoscopy while there. If stable, we need to have her continue oral iron, get her bowels moving with MiraLax and work for outpatient colonoscopy. I discussed this with her by phone this evening.  She is aware of the plan.  This can be copied to telephone note.  Thanks [employer]

## 2015-04-21 NOTE — Telephone Encounter (Signed)
Patient came today for CBC. Hemoglobin remains stable at 7.5. I have spoken to Dr Hilarie Fredrickson and he would like to make sure that the patient is taking her iron supplements, taking her miralax and having bowel movements now. Patient states that she is taking iron supplements and miralax as ordered. She is now having bowel movements. Dr Hilarie Fredrickson would also like patient to have repeat CBC in 1 week as well as be scheduled for a colonoscopy with 2 day prep in the near future. Labs entered in [facility] and colonoscopy scheduled for 04/26/15. I will get eliquis clearance from her physician. Patient and her daughter have both been instructed on time, location, prep for procedure and verbalize understanding. They also verbalize understanding regarding Dr Vena Rua other recommendations.  I have spoken to Dr Octavio Graves, patient's PCP personally and she states that patient may hold her Eliquis x 5 days prior to procedure. Patient and her daughter are advised.

## 2015-04-21 NOTE — Telephone Encounter (Signed)
See phone note from 04/21/15 for additional details.

## 2015-04-26 ENCOUNTER — Other Ambulatory Visit (INDEPENDENT_AMBULATORY_CARE_PROVIDER_SITE_OTHER): Payer: Medicare Other

## 2015-04-26 ENCOUNTER — Ambulatory Visit (AMBULATORY_SURGERY_CENTER): Payer: Medicare Other | Admitting: Internal Medicine

## 2015-04-26 ENCOUNTER — Encounter: Payer: Self-pay | Admitting: Internal Medicine

## 2015-04-26 ENCOUNTER — Other Ambulatory Visit: Payer: Self-pay

## 2015-04-26 VITALS — BP 164/79 | HR 68 | Temp 98.5°F | Resp 21 | Ht 62.0 in | Wt 144.0 lb

## 2015-04-26 DIAGNOSIS — R933 Abnormal findings on diagnostic imaging of other parts of digestive tract: Secondary | ICD-10-CM

## 2015-04-26 DIAGNOSIS — K6389 Other specified diseases of intestine: Secondary | ICD-10-CM

## 2015-04-26 DIAGNOSIS — D649 Anemia, unspecified: Secondary | ICD-10-CM

## 2015-04-26 DIAGNOSIS — D509 Iron deficiency anemia, unspecified: Secondary | ICD-10-CM

## 2015-04-26 DIAGNOSIS — D374 Neoplasm of uncertain behavior of colon: Secondary | ICD-10-CM

## 2015-04-26 DIAGNOSIS — Z8719 Personal history of other diseases of the digestive system: Secondary | ICD-10-CM

## 2015-04-26 DIAGNOSIS — Z933 Colostomy status: Secondary | ICD-10-CM | POA: Diagnosis not present

## 2015-04-26 LAB — CBC WITH DIFFERENTIAL/PLATELET
BASOS ABS: 0.1 10*3/uL (ref 0.0–0.1)
Basophils Relative: 0.3 % (ref 0.0–3.0)
EOS ABS: 0 10*3/uL (ref 0.0–0.7)
Eosinophils Relative: 0.2 % (ref 0.0–5.0)
HCT: 25.9 % — ABNORMAL LOW (ref 36.0–46.0)
Hemoglobin: 8.2 g/dL — ABNORMAL LOW (ref 12.0–15.0)
LYMPHS PCT: 20.8 % (ref 12.0–46.0)
Lymphs Abs: 3.5 10*3/uL (ref 0.7–4.0)
MCHC: 31.5 g/dL (ref 30.0–36.0)
MCV: 79 fl (ref 78.0–100.0)
Monocytes Absolute: 0.7 10*3/uL (ref 0.1–1.0)
Monocytes Relative: 4.2 % (ref 3.0–12.0)
NEUTROS ABS: 12.6 10*3/uL — AB (ref 1.4–7.7)
Neutrophils Relative %: 74.5 % (ref 43.0–77.0)
Platelets: 302 10*3/uL (ref 150.0–400.0)
RBC: 3.28 Mil/uL — AB (ref 3.87–5.11)
RDW: 18.9 % — ABNORMAL HIGH (ref 11.5–15.5)
WBC: 16.9 10*3/uL — AB (ref 4.0–10.5)

## 2015-04-26 MED ORDER — SODIUM CHLORIDE 0.9 % IV SOLN: 500.0000 mL | INTRAVENOUS | Status: DC

## 2015-04-26 NOTE — Progress Notes (Signed)
Stable to RR 

## 2015-04-26 NOTE — Patient Instructions (Signed)
YOU HAD AN ENDOSCOPIC PROCEDURE TODAY AT [facility]:   Refer to the procedure report that was given to you for any specific questions about what was found during the examination.  If the procedure report does not answer your questions, please call your gastroenterologist to clarify.  If you requested that your care partner not be given the details of your procedure findings, then the procedure report has been included in a sealed envelope for you to review at your convenience later.  YOU SHOULD EXPECT: Some feelings of bloating in the abdomen. Passage of more gas than usual.  Walking can help get rid of the air that was put into your GI tract during the procedure and reduce the bloating. If you had a lower endoscopy (such as a colonoscopy or flexible sigmoidoscopy) you may notice spotting of blood in your stool or on the toilet paper. If you underwent a bowel prep for your procedure, you may not have a normal bowel movement for a few days.  Please Note:  You might notice some irritation and congestion in your nose or some drainage.  This is from the oxygen used during your procedure.  There is no need for concern and it should clear up in a day or so.  SYMPTOMS TO REPORT IMMEDIATELY:   Following lower endoscopy (colonoscopy or flexible sigmoidoscopy):  Excessive amounts of blood in the stool  Significant tenderness or worsening of abdominal pains  Swelling of the abdomen that is new, acute  Fever of 100F or higherFor urgent or emergent issues, a gastroenterologist can be reached at any hour by calling [phone].   DIET: Your first meal following the procedure should be a small meal and then it is ok to progress to your normal diet. Heavy or fried foods are harder to digest and may make you feel nauseous or bloated.  Likewise, meals heavy in dairy and vegetables can increase bloating.  Drink plenty of fluids but you should avoid alcoholic beverages for 24 hours.  ACTIVITY:   You should plan to take it easy for the rest of today and you should NOT DRIVE or use heavy machinery until tomorrow (because of the sedation medicines used during the test).    FOLLOW UP: Our staff will call the number listed on your records the next business day following your procedure to check on you and address any questions or concerns that you may have regarding the information given to you following your procedure. If we do not reach you, we will leave a message.  However, if you are feeling well and you are not experiencing any problems, there is no need to return our call.  We will assume that you have returned to your regular daily activities without incident.  If any biopsies were taken you will be contacted by phone or by letter within the next 1-3 weeks.  Please call us at [phone] if you have not heard about the biopsies in 3 weeks.    SIGNATURES/CONFIDENTIALITY: You and/or your care partner have signed paperwork which will be entered into your electronic medical record.  These signatures attest to the fact that that the information above on your After Visit Summary has been reviewed and is understood.  Full responsibility of the confidentiality of this discharge information lies with you and/or your care-partner.  Resume Eliquis in 48 hours.  Dr. Vena Rua office will schedule a surgical referral.  Continue oral iron.

## 2015-04-26 NOTE — Op Note (Signed)
[place]  [address] [place], [id number]   COLONOSCOPY PROCEDURE REPORT  PATIENT: [name], [name]  MR#: [id number] BIRTHDATE: October 15, 1934 , 80  yrs. old GENDER: female ENDOSCOPIST: Jerene Bears, MD PROCEDURE DATE:  04/26/2015 PROCEDURE:   Colonoscopy, diagnostic, Colonoscopy with biopsy, and Submucosal injection, any substance First Screening Colonoscopy - Avg.  risk and is 50 yrs.  old or older - No.  Prior Negative Screening - Now for repeat screening. N/A  History of Adenoma - Now for follow-up colonoscopy & has been > or = to 3 yrs.  N/A  Polyps removed today? Yes ASA CLASS:   Class III INDICATIONS:an abnormal CT and recent perforated sigmoid diverticulitis status post Hartmann's procedure, iron deficiency anemia. MEDICATIONS: Monitored anesthesia care and Propofol 200 mg IV  DESCRIPTION OF PROCEDURE:   After the risks benefits and alternatives of the procedure were thoroughly explained, informed consent was obtained.  The digital rectal exam revealed no abnormalities of the rectum.   The LB [id number]  endoscope was introduced through the anus and advanced to the cecum, which was identified by both the appendix and ileocecal valve. No adverse events experienced.   The quality of the prep was good.  (Suprep was used)  The instrument was then slowly withdrawn as the colon was fully examined. Estimated blood loss is zero unless otherwise noted in this procedure report.    COLON FINDINGS: A half circumferential fungating mass, measuring 4 X 5cm in size, was found in the distal ascending colon approximately 60 cm from the colostomy.  Multiple biopsies of the lesion were performed using cold forceps.  A tattoo was applied distally on opposite walls.   The examination was otherwise normal. Retroflexion was not performed. The time to cecum = 3.6 Withdrawal time = 12.9   The scope was withdrawn and the procedure completed.  COMPLICATIONS: There were no  immediate complications.  ENDOSCOPIC IMPRESSION: 1.   Half circumferential mass, measuring 4 X 5cm in size, was found in the distal ascending colon; multiple biopsies of the lesion were performed; a tattoo was applied 2.   The examination was otherwise normal  RECOMMENDATIONS: 1.  Await biopsy results 2.  Repeat Hgb check today and monitor closely 3.  Hold Eliquis another 48 hours 4.  Surgical referral 5.  Continue oral iron  eSigned:  Jerene Bears, MD 04/26/2015 10:48 AM   cc:  the patient, PCP, cardiology   PATIENT NAME:  [name], [name] MR#: [id number]

## 2015-04-26 NOTE — Progress Notes (Signed)
Called to room to assist during endoscopic procedure.  Patient ID and intended procedure confirmed with present staff. Received instructions for my participation in the procedure from the performing physician.  

## 2015-04-27 ENCOUNTER — Telehealth: Payer: Self-pay | Admitting: *Deleted

## 2015-04-27 NOTE — Telephone Encounter (Signed)
  Follow up Call-  Call back number 04/26/2015 03/09/2014  Post procedure Call Back phone  # [phone]  Permission to leave phone message Yes Yes    [phone] Patient questions:  Do you have a fever, pain , or abdominal swelling? No. Pain Score  0 *  Have you tolerated food without any problems? Yes.    Have you been able to return to your normal activities? Yes.    Do you have any questions about your discharge instructions: Diet   No. Medications  No. Follow up visit  No.  Do you have questions or concerns about your Care? No.  Actions: * If pain score is 4 or above: No action needed, pain <4.

## 2015-05-01 ENCOUNTER — Ambulatory Visit (INDEPENDENT_AMBULATORY_CARE_PROVIDER_SITE_OTHER)
Admission: RE | Admit: 2015-05-01 | Discharge: 2015-05-01 | Disposition: A | Discharge status: Home / Self Care | Payer: Medicare Other | Source: Ambulatory Visit | Attending: Internal Medicine | Admitting: Internal Medicine

## 2015-05-01 ENCOUNTER — Telehealth: Payer: Self-pay | Admitting: Internal Medicine

## 2015-05-01 DIAGNOSIS — C182 Malignant neoplasm of ascending colon: Secondary | ICD-10-CM | POA: Diagnosis not present

## 2015-05-01 DIAGNOSIS — K6389 Other specified diseases of intestine: Secondary | ICD-10-CM

## 2015-05-01 MED ORDER — IOHEXOL 300 MG/ML  SOLN
100.0000 mL | Freq: Once | INTRAMUSCULAR | Status: AC | PRN
  Administered 2015-05-01: 100 mL via INTRAVENOUS

## 2015-05-01 NOTE — Telephone Encounter (Signed)
CT chest/abd/pelvis reviewed with Dr. Rosario Jacks and Dr. Anselm Pancoast (IR) by phone Pelvic fluid collection is now definitive abscess which Dr. Anselm Pancoast feels would be drainable Given her probable right middle lobe pneumonia and complex medical care including chronic anticoagulation, I feel that admission to the hospital for IV antibiotics while anticoagulation is held followed by interventional radiology drainage of pelvic fluid collection is the appropriate course of action I spoken to the patient by phone this afternoon.  She is feeling fairly well today. Has mild nausea. No fevers or chills. Mild nonproductive cough. No chest pain or dyspnea She was started on Levaquin on Friday by primary care for "bronchitis". I instructed she continue Levaquin She will hold Eliquis as of now. She will come to the office in the morning so that I can examine her and then we'll plan direct admission to the hospitalist service for IV antibiotics, IR consultation for pelvic abscess and treatment of pneumonia She is instructed to go to the ER tonight if her condition changes or worsens.

## 2015-05-01 NOTE — Telephone Encounter (Signed)
Spoke with pt and let her know she does not need to be on a special diet.

## 2015-05-01 NOTE — Telephone Encounter (Signed)
Left message for pt to call back  °

## 2015-05-02 ENCOUNTER — Encounter: Payer: Self-pay | Admitting: Internal Medicine

## 2015-05-02 ENCOUNTER — Inpatient Hospital Stay (HOSPITAL_COMMUNITY)
Admission: AD | Admit: 2015-05-02 | Discharge: 2015-05-08 | DRG: 862 | Disposition: A | Discharge status: Home Health | Payer: Medicare Other | Source: Ambulatory Visit | Attending: Internal Medicine | Admitting: Internal Medicine

## 2015-05-02 ENCOUNTER — Inpatient Hospital Stay (HOSPITAL_COMMUNITY): Payer: Medicare Other

## 2015-05-02 ENCOUNTER — Encounter (HOSPITAL_COMMUNITY): Payer: Self-pay | Admitting: Internal Medicine

## 2015-05-02 ENCOUNTER — Ambulatory Visit (INDEPENDENT_AMBULATORY_CARE_PROVIDER_SITE_OTHER): Payer: Medicare Other | Admitting: Internal Medicine

## 2015-05-02 VITALS — BP 170/88 | HR 70 | Wt 149.4 lb

## 2015-05-02 DIAGNOSIS — D500 Iron deficiency anemia secondary to blood loss (chronic): Secondary | ICD-10-CM | POA: Diagnosis present

## 2015-05-02 DIAGNOSIS — Z8249 Family history of ischemic heart disease and other diseases of the circulatory system: Secondary | ICD-10-CM | POA: Diagnosis not present

## 2015-05-02 DIAGNOSIS — Z961 Presence of intraocular lens: Secondary | ICD-10-CM | POA: Diagnosis present

## 2015-05-02 DIAGNOSIS — I130 Hypertensive heart and chronic kidney disease with heart failure and stage 1 through stage 4 chronic kidney disease, or unspecified chronic kidney disease: Secondary | ICD-10-CM | POA: Diagnosis present

## 2015-05-02 DIAGNOSIS — R531 Weakness: Secondary | ICD-10-CM | POA: Diagnosis present

## 2015-05-02 DIAGNOSIS — I10 Essential (primary) hypertension: Secondary | ICD-10-CM

## 2015-05-02 DIAGNOSIS — I2510 Atherosclerotic heart disease of native coronary artery without angina pectoris: Secondary | ICD-10-CM

## 2015-05-02 DIAGNOSIS — F419 Anxiety disorder, unspecified: Secondary | ICD-10-CM | POA: Diagnosis present

## 2015-05-02 DIAGNOSIS — Z9049 Acquired absence of other specified parts of digestive tract: Secondary | ICD-10-CM | POA: Diagnosis not present

## 2015-05-02 DIAGNOSIS — E7800 Pure hypercholesterolemia, unspecified: Secondary | ICD-10-CM

## 2015-05-02 DIAGNOSIS — Z933 Colostomy status: Secondary | ICD-10-CM | POA: Diagnosis not present

## 2015-05-02 DIAGNOSIS — Z833 Family history of diabetes mellitus: Secondary | ICD-10-CM

## 2015-05-02 DIAGNOSIS — N183 Chronic kidney disease, stage 3 unspecified: Secondary | ICD-10-CM | POA: Diagnosis present

## 2015-05-02 DIAGNOSIS — I272 Other secondary pulmonary hypertension: Secondary | ICD-10-CM | POA: Diagnosis present

## 2015-05-02 DIAGNOSIS — F329 Major depressive disorder, single episode, unspecified: Secondary | ICD-10-CM | POA: Diagnosis present

## 2015-05-02 DIAGNOSIS — D122 Benign neoplasm of ascending colon: Secondary | ICD-10-CM | POA: Diagnosis present

## 2015-05-02 DIAGNOSIS — Z9841 Cataract extraction status, right eye: Secondary | ICD-10-CM

## 2015-05-02 DIAGNOSIS — F411 Generalized anxiety disorder: Secondary | ICD-10-CM | POA: Diagnosis present

## 2015-05-02 DIAGNOSIS — S2249XA Multiple fractures of ribs, unspecified side, initial encounter for closed fracture: Secondary | ICD-10-CM | POA: Diagnosis present

## 2015-05-02 DIAGNOSIS — J189 Pneumonia, unspecified organism: Secondary | ICD-10-CM | POA: Diagnosis not present

## 2015-05-02 DIAGNOSIS — D374 Neoplasm of uncertain behavior of colon: Secondary | ICD-10-CM

## 2015-05-02 DIAGNOSIS — Z882 Allergy status to sulfonamides status: Secondary | ICD-10-CM | POA: Diagnosis not present

## 2015-05-02 DIAGNOSIS — Y95 Nosocomial condition: Secondary | ICD-10-CM | POA: Diagnosis present

## 2015-05-02 DIAGNOSIS — I5032 Chronic diastolic (congestive) heart failure: Secondary | ICD-10-CM | POA: Diagnosis present

## 2015-05-02 DIAGNOSIS — J181 Lobar pneumonia, unspecified organism: Secondary | ICD-10-CM | POA: Diagnosis present

## 2015-05-02 DIAGNOSIS — K2270 Barrett's esophagus without dysplasia: Secondary | ICD-10-CM | POA: Diagnosis present

## 2015-05-02 DIAGNOSIS — R296 Repeated falls: Secondary | ICD-10-CM | POA: Diagnosis present

## 2015-05-02 DIAGNOSIS — N739 Female pelvic inflammatory disease, unspecified: Secondary | ICD-10-CM

## 2015-05-02 DIAGNOSIS — K449 Diaphragmatic hernia without obstruction or gangrene: Secondary | ICD-10-CM | POA: Diagnosis present

## 2015-05-02 DIAGNOSIS — Z7901 Long term (current) use of anticoagulants: Secondary | ICD-10-CM | POA: Diagnosis not present

## 2015-05-02 DIAGNOSIS — Z87891 Personal history of nicotine dependence: Secondary | ICD-10-CM | POA: Diagnosis not present

## 2015-05-02 DIAGNOSIS — F552 Abuse of laxatives: Secondary | ICD-10-CM | POA: Diagnosis present

## 2015-05-02 DIAGNOSIS — Z79899 Other long term (current) drug therapy: Secondary | ICD-10-CM

## 2015-05-02 DIAGNOSIS — F32A Depression, unspecified: Secondary | ICD-10-CM | POA: Diagnosis present

## 2015-05-02 DIAGNOSIS — Z9861 Coronary angioplasty status: Secondary | ICD-10-CM

## 2015-05-02 DIAGNOSIS — Z85820 Personal history of malignant melanoma of skin: Secondary | ICD-10-CM

## 2015-05-02 DIAGNOSIS — Z9842 Cataract extraction status, left eye: Secondary | ICD-10-CM

## 2015-05-02 DIAGNOSIS — I480 Paroxysmal atrial fibrillation: Secondary | ICD-10-CM

## 2015-05-02 DIAGNOSIS — Z808 Family history of malignant neoplasm of other organs or systems: Secondary | ICD-10-CM | POA: Diagnosis not present

## 2015-05-02 DIAGNOSIS — E876 Hypokalemia: Secondary | ICD-10-CM | POA: Diagnosis not present

## 2015-05-02 DIAGNOSIS — Z955 Presence of coronary angioplasty implant and graft: Secondary | ICD-10-CM | POA: Diagnosis not present

## 2015-05-02 DIAGNOSIS — K219 Gastro-esophageal reflux disease without esophagitis: Secondary | ICD-10-CM | POA: Diagnosis present

## 2015-05-02 DIAGNOSIS — T814XXA Infection following a procedure, initial encounter: Secondary | ICD-10-CM | POA: Diagnosis present

## 2015-05-02 DIAGNOSIS — Z8659 Personal history of other mental and behavioral disorders: Secondary | ICD-10-CM | POA: Diagnosis not present

## 2015-05-02 DIAGNOSIS — R0600 Dyspnea, unspecified: Secondary | ICD-10-CM

## 2015-05-02 DIAGNOSIS — K589 Irritable bowel syndrome without diarrhea: Secondary | ICD-10-CM | POA: Diagnosis present

## 2015-05-02 DIAGNOSIS — D509 Iron deficiency anemia, unspecified: Secondary | ICD-10-CM

## 2015-05-02 DIAGNOSIS — N732 Unspecified parametritis and pelvic cellulitis: Secondary | ICD-10-CM

## 2015-05-02 DIAGNOSIS — S2243XA Multiple fractures of ribs, bilateral, initial encounter for closed fracture: Secondary | ICD-10-CM | POA: Diagnosis present

## 2015-05-02 DIAGNOSIS — H3530 Unspecified macular degeneration: Secondary | ICD-10-CM | POA: Diagnosis present

## 2015-05-02 DIAGNOSIS — K6389 Other specified diseases of intestine: Secondary | ICD-10-CM | POA: Diagnosis not present

## 2015-05-02 DIAGNOSIS — R609 Edema, unspecified: Secondary | ICD-10-CM

## 2015-05-02 DIAGNOSIS — F418 Other specified anxiety disorders: Secondary | ICD-10-CM | POA: Diagnosis not present

## 2015-05-02 LAB — RETICULOCYTES
RBC.: 3.21 MIL/uL — ABNORMAL LOW (ref 3.87–5.11)
RETIC COUNT ABSOLUTE: 102.7 10*3/uL (ref 19.0–186.0)
Retic Ct Pct: 3.2 % — ABNORMAL HIGH (ref 0.4–3.1)

## 2015-05-02 LAB — FOLATE: FOLATE: 8.5 ng/mL (ref >5.9)

## 2015-05-02 LAB — COMPREHENSIVE METABOLIC PANEL
ALBUMIN: 2.4 g/dL — AB (ref 3.5–5.0)
ALK PHOS: 90 U/L (ref 38–126)
ALT: 8 U/L — AB (ref 14–54)
ANION GAP: 11 (ref 5–15)
AST: 12 U/L — AB (ref 15–41)
BILIRUBIN TOTAL: 0.4 mg/dL (ref 0.3–1.2)
BUN: 14 mg/dL (ref 6–20)
CALCIUM: 8.2 mg/dL — AB (ref 8.9–10.3)
CO2: 22 mmol/L (ref 22–32)
CREATININE: 1.3 mg/dL — AB (ref 0.44–1.00)
Chloride: 107 mmol/L (ref 101–111)
GFR calc Af Amer: 44 mL/min — ABNORMAL LOW (ref >60)
GFR calc non Af Amer: 38 mL/min — ABNORMAL LOW (ref >60)
GLUCOSE: 86 mg/dL (ref 65–99)
Potassium: 3.5 mmol/L (ref 3.5–5.1)
Sodium: 140 mmol/L (ref 135–145)
TOTAL PROTEIN: 5.8 g/dL — AB (ref 6.5–8.1)

## 2015-05-02 LAB — CBC WITH DIFFERENTIAL/PLATELET
BASOS ABS: 0 10*3/uL (ref 0.0–0.1)
BASOS PCT: 0 %
EOS PCT: 0 %
Eosinophils Absolute: 0 10*3/uL (ref 0.0–0.7)
HCT: 25.4 % — ABNORMAL LOW (ref 36.0–46.0)
Hemoglobin: 7.8 g/dL — ABNORMAL LOW (ref 12.0–15.0)
Lymphocytes Relative: 25 %
Lymphs Abs: 2.2 10*3/uL (ref 0.7–4.0)
MCH: 24.3 pg — ABNORMAL LOW (ref 26.0–34.0)
MCHC: 30.7 g/dL (ref 30.0–36.0)
MCV: 79.1 fL (ref 78.0–100.0)
MONO ABS: 0.5 10*3/uL (ref 0.1–1.0)
Monocytes Relative: 5 %
Neutro Abs: 6.2 10*3/uL (ref 1.7–7.7)
Neutrophils Relative %: 70 %
PLATELETS: 263 10*3/uL (ref 150–400)
RBC: 3.21 MIL/uL — ABNORMAL LOW (ref 3.87–5.11)
RDW: 17.4 % — ABNORMAL HIGH (ref 11.5–15.5)
WBC: 9 10*3/uL (ref 4.0–10.5)

## 2015-05-02 LAB — IRON AND TIBC
IRON: 25 ug/dL — AB (ref 28–170)
Saturation Ratios: 12 % (ref 10.4–31.8)
TIBC: 210 ug/dL — AB (ref 250–450)
UIBC: 185 ug/dL

## 2015-05-02 LAB — VITAMIN B12: Vitamin B-12: 866 pg/mL (ref 180–914)

## 2015-05-02 LAB — FERRITIN: FERRITIN: 45 ng/mL (ref 11–307)

## 2015-05-02 LAB — OCCULT BLOOD X 1 CARD TO LAB, STOOL: Fecal Occult Bld: POSITIVE — AB

## 2015-05-02 LAB — STREP PNEUMONIAE URINARY ANTIGEN: Strep Pneumo Urinary Antigen: NEGATIVE

## 2015-05-02 MED ORDER — HYDRALAZINE HCL 20 MG/ML IJ SOLN
5.0000 mg | INTRAMUSCULAR | Status: DC | PRN
  Administered 2015-05-02 – 2015-05-05 (×6): 5 mg via INTRAVENOUS
  Filled 2015-05-02 (×6): qty 1

## 2015-05-02 MED ORDER — OCUVITE-LUTEIN PO CAPS
1.0000 | ORAL_CAPSULE | Freq: Every day | ORAL | Status: DC
  Administered 2015-05-03: 1 via ORAL
  Filled 2015-05-02 (×2): qty 1

## 2015-05-02 MED ORDER — METOPROLOL TARTRATE 25 MG PO TABS
12.5000 mg | ORAL_TABLET | Freq: Two times a day (BID) | ORAL | Status: DC
  Administered 2015-05-02 – 2015-05-07 (×11): 12.5 mg via ORAL
  Filled 2015-05-02 (×11): qty 1

## 2015-05-02 MED ORDER — SODIUM CHLORIDE 0.9% FLUSH: 3.0000 mL | INTRAVENOUS | Status: DC | PRN

## 2015-05-02 MED ORDER — TRAZODONE HCL 100 MG PO TABS
100.0000 mg | ORAL_TABLET | Freq: Every day | ORAL | Status: DC
  Administered 2015-05-02 – 2015-05-07 (×6): 100 mg via ORAL
  Filled 2015-05-02 (×6): qty 1

## 2015-05-02 MED ORDER — SODIUM CHLORIDE 0.9 % IV SOLN
INTRAVENOUS | Status: DC
  Administered 2015-05-02 – 2015-05-03 (×3): via INTRAVENOUS

## 2015-05-02 MED ORDER — GUAIFENESIN ER 600 MG PO TB12
600.0000 mg | ORAL_TABLET | Freq: Two times a day (BID) | ORAL | Status: DC
  Administered 2015-05-02 – 2015-05-08 (×13): 600 mg via ORAL
  Filled 2015-05-02 (×13): qty 1

## 2015-05-02 MED ORDER — ROSUVASTATIN CALCIUM 10 MG PO TABS
10.0000 mg | ORAL_TABLET | Freq: Every day | ORAL | Status: DC
  Administered 2015-05-02 – 2015-05-07 (×6): 10 mg via ORAL
  Filled 2015-05-02 (×6): qty 1

## 2015-05-02 MED ORDER — ACETAMINOPHEN 650 MG RE SUPP: 650.0000 mg | Freq: Four times a day (QID) | RECTAL | Status: DC | PRN

## 2015-05-02 MED ORDER — VANCOMYCIN HCL IN DEXTROSE 1-5 GM/200ML-% IV SOLN
1000.0000 mg | INTRAVENOUS | Status: DC
  Administered 2015-05-03 – 2015-05-04 (×2): 1000 mg via INTRAVENOUS
  Filled 2015-05-02 (×3): qty 200

## 2015-05-02 MED ORDER — FERROUS SULFATE 325 (65 FE) MG PO TABS
325.0000 mg | ORAL_TABLET | Freq: Three times a day (TID) | ORAL | Status: DC
  Administered 2015-05-02 – 2015-05-08 (×18): 325 mg via ORAL
  Filled 2015-05-02 (×19): qty 1

## 2015-05-02 MED ORDER — NITROGLYCERIN 0.4 MG SL SUBL: 0.4000 mg | SUBLINGUAL_TABLET | SUBLINGUAL | Status: DC | PRN

## 2015-05-02 MED ORDER — PRESERVISION AREDS PO TABS: 1.0000 | ORAL_TABLET | Freq: Two times a day (BID) | ORAL | Status: DC

## 2015-05-02 MED ORDER — PROPYLENE GLYCOL 0.6 % OP SOLN: Freq: Every day | OPHTHALMIC | Status: DC | PRN

## 2015-05-02 MED ORDER — CITALOPRAM HYDROBROMIDE 20 MG PO TABS: 20.0000 mg | ORAL_TABLET | Freq: Every day | ORAL | Status: DC

## 2015-05-02 MED ORDER — PIPERACILLIN-TAZOBACTAM 3.375 G IVPB
3.3750 g | Freq: Three times a day (TID) | INTRAVENOUS | Status: DC
  Administered 2015-05-02 – 2015-05-05 (×9): 3.375 g via INTRAVENOUS
  Filled 2015-05-02 (×10): qty 50

## 2015-05-02 MED ORDER — ONDANSETRON HCL 4 MG/2ML IJ SOLN
4.0000 mg | Freq: Four times a day (QID) | INTRAMUSCULAR | Status: DC | PRN
  Administered 2015-05-03 – 2015-05-04 (×2): 4 mg via INTRAVENOUS
  Filled 2015-05-02 (×2): qty 2

## 2015-05-02 MED ORDER — ZOLPIDEM TARTRATE 5 MG PO TABS
5.0000 mg | ORAL_TABLET | Freq: Every day | ORAL | Status: DC
  Administered 2015-05-02 – 2015-05-07 (×6): 5 mg via ORAL
  Filled 2015-05-02 (×6): qty 1

## 2015-05-02 MED ORDER — ONDANSETRON HCL 4 MG PO TABS
4.0000 mg | ORAL_TABLET | Freq: Four times a day (QID) | ORAL | Status: DC | PRN
  Administered 2015-05-03 – 2015-05-07 (×4): 4 mg via ORAL
  Filled 2015-05-02 (×4): qty 1

## 2015-05-02 MED ORDER — LOSARTAN POTASSIUM 50 MG PO TABS
100.0000 mg | ORAL_TABLET | Freq: Every day | ORAL | Status: DC
  Administered 2015-05-03 – 2015-05-06 (×4): 100 mg via ORAL
  Filled 2015-05-02 (×4): qty 2

## 2015-05-02 MED ORDER — CITALOPRAM HYDROBROMIDE 40 MG PO TABS
40.0000 mg | ORAL_TABLET | Freq: Every day | ORAL | Status: DC
  Administered 2015-05-02 – 2015-05-07 (×6): 40 mg via ORAL
  Filled 2015-05-02 (×6): qty 1

## 2015-05-02 MED ORDER — ALPRAZOLAM 0.25 MG PO TABS
0.2500 mg | ORAL_TABLET | Freq: Two times a day (BID) | ORAL | Status: DC | PRN
  Administered 2015-05-03 – 2015-05-08 (×6): 0.25 mg via ORAL
  Filled 2015-05-02 (×6): qty 1

## 2015-05-02 MED ORDER — VANCOMYCIN HCL 10 G IV SOLR
1250.0000 mg | Freq: Once | INTRAVENOUS | Status: AC
  Administered 2015-05-02: 1250 mg via INTRAVENOUS
  Filled 2015-05-02: qty 1250

## 2015-05-02 MED ORDER — DEXTROSE 5 % IV SOLN
1.0000 g | Freq: Three times a day (TID) | INTRAVENOUS | Status: DC
  Filled 2015-05-02: qty 1

## 2015-05-02 MED ORDER — SODIUM CHLORIDE 0.9% FLUSH
3.0000 mL | Freq: Two times a day (BID) | INTRAVENOUS | Status: DC
  Administered 2015-05-02 – 2015-05-07 (×8): 3 mL via INTRAVENOUS

## 2015-05-02 MED ORDER — ALBUTEROL SULFATE (2.5 MG/3ML) 0.083% IN NEBU
2.5000 mg | INHALATION_SOLUTION | Freq: Three times a day (TID) | RESPIRATORY_TRACT | Status: DC
  Administered 2015-05-03 – 2015-05-05 (×7): 2.5 mg via RESPIRATORY_TRACT
  Filled 2015-05-02 (×8): qty 3

## 2015-05-02 MED ORDER — FERROUS SULFATE 325 (65 FE) MG PO TABS: 325.0000 mg | ORAL_TABLET | Freq: Every day | ORAL | Status: DC

## 2015-05-02 MED ORDER — OXYCODONE-ACETAMINOPHEN 5-325 MG PO TABS: 1.0000 | ORAL_TABLET | Freq: Four times a day (QID) | ORAL | Status: DC | PRN

## 2015-05-02 MED ORDER — GUAIFENESIN-DM 100-10 MG/5ML PO SYRP: 5.0000 mL | ORAL_SOLUTION | ORAL | Status: DC | PRN

## 2015-05-02 MED ORDER — SODIUM CHLORIDE 0.9 % IV SOLN: 250.0000 mL | INTRAVENOUS | Status: DC | PRN

## 2015-05-02 MED ORDER — ACETAMINOPHEN 325 MG PO TABS: 650.0000 mg | ORAL_TABLET | Freq: Four times a day (QID) | ORAL | Status: DC | PRN

## 2015-05-02 MED ORDER — ALBUTEROL SULFATE (2.5 MG/3ML) 0.083% IN NEBU
3.0000 mL | INHALATION_SOLUTION | Freq: Four times a day (QID) | RESPIRATORY_TRACT | Status: DC | PRN
  Administered 2015-05-02 – 2015-05-07 (×2): 3 mL via RESPIRATORY_TRACT
  Filled 2015-05-02 (×2): qty 3

## 2015-05-02 MED ORDER — POLYVINYL ALCOHOL 1.4 % OP SOLN
1.0000 [drp] | Freq: Every day | OPHTHALMIC | Status: DC | PRN
  Filled 2015-05-02: qty 15

## 2015-05-02 MED ORDER — PANTOPRAZOLE SODIUM 40 MG PO TBEC
40.0000 mg | DELAYED_RELEASE_TABLET | Freq: Every day | ORAL | Status: DC
  Administered 2015-05-03 – 2015-05-08 (×6): 40 mg via ORAL
  Filled 2015-05-02 (×6): qty 1

## 2015-05-02 MED ORDER — VITAMIN D (ERGOCALCIFEROL) 1.25 MG (50000 UNIT) PO CAPS
50000.0000 [IU] | ORAL_CAPSULE | ORAL | Status: DC
  Administered 2015-05-02: 50000 [IU] via ORAL
  Filled 2015-05-02: qty 1

## 2015-05-02 NOTE — Progress Notes (Signed)
Pharmacy Antibiotic Note  [name] is a 81 y.o. female admitted on 05/02/2015 with pelvic abscess and pneumonia.  Pharmacy has been consulted for Vancomycin and Zosyn dosing.  Plan:  Zosyn 3.375g IV q8h (4 hour infusion time).   Vancomycin 1250 mg IV x 1 ordered while SCr in process.    After SCr returned, ordered maintenance vancomycin dose of 1g IV q24h to start tomorrow for CrCl~37. Trough goal 15-20 mcg/ml.    F/u SCr and trough levels for dose adjustments.  Height: 5\' 2"  (157.5 cm) Weight: 148 lb 11.2 oz (67.45 kg) IBW/kg (Calculated) : 50.1  Temp (24hrs), Avg:98.2 F (36.8 C), Min:98.2 F (36.8 C), Max:98.2 F (36.8 C)   Recent Labs Lab 04/26/15 1146  WBC 16.9*    CrCl cannot be calculated (Patient has no serum creatinine result on file.).    Allergies  Allergen Reactions  . Sulfamethoxazole Rash    Antimicrobials this admission: 1/31 Vancomycin >>  1/31 Zosyn >>   Dose adjustments this admission: -  Microbiology results: 1/31 BCx: sent 1/31 Sputum: ordered  1/31 Strep pneumo ur ag: ordered  Thank you for allowing pharmacy to be a part of this patient's care.  Hershal Coria 05/02/2015 11:56 AM

## 2015-05-02 NOTE — Progress Notes (Signed)
Per daughter, patient had several narcotic medications in her purse on admission. RN told daughter she must take all medications home per policy and patient's safety. RN told patient and daughter it would be best to send all valuables and medications home with daughter since patient will be having a procedure done tomorrow and she will be leaving her room. Daughter took patient's purse and stated she also took all medications home.

## 2015-05-02 NOTE — H&P (Signed)
History and Physical:    [name]   [id number] DOB: 1934-11-27 DOA: 05/02/2015  Referring MD/provider: Dr. Hilarie Fredrickson PCP: Terald Sleeper, PA-C   Chief Complaint: Weakness and fatigue  History of Present Illness:   Heather Oar Youtz is an 81 y.o. female with recent perforated sigmoid diverticulosis complicated by intra-abdominal fluid collections status post Hartman's procedure with end colostomy. She had a colonoscopy last week which revealed a large ascending colon mass with biopsy + tubular adenoma which will need to be removed.  She also has a history of large hiatal hernia with partial reduction and segmental gastrectomy.  She has had persistent anemia.  She is on Eliquis chronically for a history of atrial fibrillation, but has not taken it today upon the advice of Dr. Hilarie Fredrickson.  She was seen by Dr. Hilarie Fredrickson today for GI follow-up, who ordered a CT scan one day prior, which showed right sided pneumonia and a pelvic abscess. She was referred as a direct admission from his office. The patient does endorse shortness of breath, a cough productive of white sputum and an occasional wheeze. Symptoms have been present for several days. She also continues to feel weak and run down dating back to her original surgery. Denies any black or bloody bowel movements in her ostomy bag. She says she is persistently nauseated and occasionally has a dry heave, but no frank vomiting. The patient's daughter took me aside, and told me that the patient has a history of abusing prescription medications including opiates and Xanax. She thinks her falls are secondary to overmedicating. According to the daughter, she also has a history of bulimia and laxative abuse and feels this is the reason that her gastrointestinal tract is "messed up".  ROS:   Review of Systems  Constitutional: Positive for malaise/fatigue. Negative for fever, chills and weight loss.  Eyes: Negative.   Respiratory: Positive for cough, sputum  production, shortness of breath and wheezing. Negative for hemoptysis.        White sputum  Cardiovascular: Negative for chest pain and palpitations.  Gastrointestinal: Positive for nausea and diarrhea. Negative for heartburn, vomiting, abdominal pain, blood in stool and melena.       Colostomy: Loose stools.  Genitourinary: Negative.   Musculoskeletal: Positive for falls.       2-3 falls at home in the last month or two.  Skin: Negative.   Neurological: Positive for weakness. Negative for dizziness, focal weakness and headaches.  Endo/Heme/Allergies: Bruises/bleeds easily.       Cold intolerance  Psychiatric/Behavioral: Positive for depression. The patient is nervous/anxious.      Past Medical History:   Past Medical History  Diagnosis Date  . Depressive disorder, not elsewhere classified   . Anxiety state, unspecified   . Pure hypercholesterolemia   . Esophageal reflux   . Infection of esophagostomy ([place])   . Congestive heart failure, unspecified     diastolic heart failure  . Pulmonary hypertension (HCC)     PA systolic pressure [id number] mmHg by echocardiogram, PA pressure 33/10 by cardiac catheterization PA saturation 62% thermodilution cardiac index 2.0 thick cardiac index 2.4  . Bradycardia     previously with coreg  . Tricuspid regurgitation     moderate tricuspid regurgitation by echocardiogram, prior use of anorexic agents  . Melanoma ([place]) 1995    removal on back   . Coronary artery disease     a. Moderate to severe coronary disease involving the left anterior descending artery and right  coronary artery.  Sequential stenosis in the LAD is significant.  Right coronary artery is moderate to severely likely nonischemic. Questionable small LVOT obstruction.  No Brockenbrough maneuver was performed.  Catheterization January 2013; b. 05/2014 MV no isch/infarct, EF 60%.  Marland Kitchen PONV (postoperative nausea and vomiting)   . Hypertension   . H/O hiatal hernia   . Gastritis   .  [name]'s esophagus   . IBS (irritable bowel syndrome)   . Macular degeneration   . Dysrhythmia   . Diverticulitis   . Perforated bowel ([place])   . Anemia     post operative  . Atrial fibrillation with RVR ([place]) 09/06/2012  . Perforated sigmoid colon ([place]) 03/03/2015    Past Surgical History:   Past Surgical History  Procedure Laterality Date  . Nissen fundoplication  [id number]  . Rotator cuff repair  ~ 2001    right  . Coronary angioplasty with stent placement  05/08/11    "1"  . Tonsillectomy  ~ 1944  . Shoulder arthroscopy  ~ 2004; 2005    left; "joint's wore out"  . Cataract extraction w/ intraocular lens  implant, bilateral  ~ 2002  . Tubal ligation  1972  . Percutaneous coronary stent intervention (pci-s) N/A 05/08/2011    Procedure: PERCUTANEOUS CORONARY STENT INTERVENTION (PCI-S);  Surgeon: Wellington Hampshire, MD;  Location: [facility] CATH LAB;  Service: Cardiovascular;  Laterality: N/A;  . Esophageal manometry N/A 06/06/2014    Procedure: ESOPHAGEAL MANOMETRY (EM);  Surgeon: Jerene Bears, MD;  Location: [facility] ENDOSCOPY;  Service: Gastroenterology;  Laterality: N/A;  . Laparoscopic nissen fundoplication N/A [id number]    Procedure: LAPAROSCOPIC  LYSIS OF ADHESIONS, SEGMENTAL GASTRECTOMY, PARTIAL REDUCTION OF HERNIA;  Surgeon: [name], MD;  Location: [facility] ORS;  Service: General;  Laterality: N/A;  . Hernia repair    . Colectomy with colostomy creation/hartmann procedure N/A 03/05/2015    Procedure: PARTIAL COLECTOMY WITH COLOSTOMY CREATION/HARTMANN PROCEDURE;  Surgeon: Aviva Signs, MD;  Location: [facility] ORS;  Service: General;  Laterality: N/A;  . Cardiac catheterization Left 03/05/2015    Procedure: CENTRAL LINE INSERTION;  Surgeon: Aviva Signs, MD;  Location: [facility] ORS;  Service: General;  Laterality: Left;    Social History:   Social History   Social History  . Marital Status: Divorced    Spouse Name: N/A  . Number of Children: 3  . Years of Education: N/A   Occupational History  .  Retired    Social History Main Topics  . Smoking status: Former Smoker -- 1.00 packs/day for 20 years    Types: Cigarettes    Start date: 10/12/1958    Quit date: 04/26/1988  . Smokeless tobacco: Never Used  . Alcohol Use: No  . Drug Use: No  . Sexual Activity: No   Other Topics Concern  . Not on file   Social History Narrative   Divorced.  Lives alone.  Ambulates with a walker.     Family history:   Family History  Problem Relation Age of Onset  . Heart attack Father   . Hypertension Father   . Diabetes Mother   . Hypertension Mother   . Colon cancer Neg Hx   . Esophageal cancer Neg Hx   . Rectal cancer Neg Hx   . Stomach cancer Neg Hx   . Skin cancer Brother     melanoma    Allergies   Sulfamethoxazole  Current Medications:   Prior to Admission medications   Medication Sig Start Date End Date  Taking? Authorizing Provider  albuterol (PROAIR HFA) 108 (90 BASE) MCG/ACT inhaler Inhale 2 puffs into the lungs every 6 (six) hours as needed for shortness of breath.     Historical Provider, MD  ALPRAZolam Duanne Moron) 0.25 MG tablet Take 1 tablet (0.25 mg total) by mouth 2 (two) times daily as needed for anxiety. 03/15/15   Estill Dooms, MD  citalopram (CELEXA) 40 MG tablet Take 0.5 tablets (20 mg total) by mouth daily. Patient taking differently: Take 20 mg by mouth at bedtime.  09/12/12   Nimish Luther Parody, MD  esomeprazole (NEXIUM) 40 MG capsule Take 20 mg by mouth daily before breakfast.     Historical Provider, MD  feeding supplement, ENSURE ENLIVE, (ENSURE ENLIVE) LIQD Take 237 mLs by mouth 2 (two) times daily between meals. 03/14/15   Silver Huguenin Elgergawy, MD  ferrous sulfate 325 (65 FE) MG EC tablet Take 325 mg by mouth daily with breakfast.      Historical Provider, MD  losartan (COZAAR) 100 MG tablet Take 1 tablet (100 mg total) by mouth daily. 11/11/12   Herminio Commons, MD  metoprolol tartrate (LOPRESSOR) 25 MG tablet Take 1 tablet (25 mg total) by mouth 2 (two)  times daily. 03/14/15   Silver Huguenin Elgergawy, MD  Multiple Vitamins-Minerals (PRESERVISION AREDS) TABS Take 1 tablet by mouth 2 (two) times daily.     Historical Provider, MD  nitroGLYCERIN (NITROSTAT) 0.4 MG SL tablet Place 0.4 mg under the tongue every 5 (five) minutes as needed for chest pain. Reported on 04/26/2015    Historical Provider, MD  ondansetron (ZOFRAN ODT) 4 MG disintegrating tablet 4mg  ODT q4 hours prn nausea/vomit 02/10/15   Milton Ferguson, MD  oxyCODONE-acetaminophen (PERCOCET/ROXICET) 5-325 MG tablet Take 1 tablet by mouth every 6 (six) hours as needed. 03/15/15   Estill Dooms, MD  Propylene Glycol (SYSTANE BALANCE OP) Place 1-2 drops into both eyes daily as needed (dry eyes). Reported on 04/26/2015    Historical Provider, MD  rosuvastatin (CRESTOR) 10 MG tablet Take 10 mg by mouth at bedtime.     Historical Provider, MD  traZODone (DESYREL) 100 MG tablet Take 100 mg by mouth at bedtime.     Historical Provider, MD  Vitamin D, Ergocalciferol, (DRISDOL) 50000 UNITS CAPS Take 50,000 Units by mouth every 7 (seven) days. Patient takes on Tuesdays    Historical Provider, MD    Physical Exam:   Filed Vitals:   05/02/15 1114  BP: 185/89  Pulse: 64  Temp: 98.2 F (36.8 C)  TempSrc: Oral  Resp: 18  Height: 5\' 2"  (1.575 m)  Weight: 67.45 kg (148 lb 11.2 oz)  SpO2: 98%     Physical Exam: Blood pressure 185/89, pulse 64, temperature 98.2 F (36.8 C), temperature source Oral, resp. rate 18, height 5\' 2"  (1.575 m), weight 67.45 kg (148 lb 11.2 oz), SpO2 98 %. Gen: No acute distress. Head: Normocephalic, atraumatic. Eyes: PERRL, EOMI, sclerae nonicteric. Mouth: Oropharynx with dry mucous membranes Neck: Supple, no thyromegaly, no lymphadenopathy, no jugular venous distention. Chest: Lungs diminished right base.  No rhonchi, rales or wheezes. CV: Heart sounds are regular.  No M/R/G. Abdomen: Soft, nontender, nondistended with normal active bowel sounds. Extremities: Extremities  show 2-3+ pitting edema, worse on left. Skin: Warm and dry. Neuro: Alert and oriented times 3; grossly nonfocal. Psych: Mood and affect normal.   Data Review:    Labs: Basic Metabolic Panel: No results for input(s): NA, K, CL, CO2, GLUCOSE, BUN, CREATININE, CALCIUM, MG,  PHOS in the last 168 hours. Liver Function Tests: No results for input(s): AST, ALT, ALKPHOS, BILITOT, PROT, ALBUMIN in the last 168 hours. No results for input(s): LIPASE, AMYLASE in the last 168 hours. No results for input(s): AMMONIA in the last 168 hours. CBC:  Recent Labs Lab 04/26/15 1146  WBC 16.9*  NEUTROABS 12.6*  HGB 8.2 Repeated and verified X2.*  HCT 25.9 Repeated and verified X2.*  MCV 79.0  PLT 302.0   Cardiac Enzymes: No results for input(s): CKTOTAL, CKMB, CKMBINDEX, TROPONINI in the last 168 hours.  BNP (last 3 results) No results for input(s): PROBNP in the last 8760 hours. CBG: No results for input(s): GLUCAP in the last 168 hours.  Radiographic Studies:  Ct Chest Abdomen Pelvis W Contrast  05/01/2015  CLINICAL DATA:  Surgery for ruptured colon on 03/05/2015. Colonoscopy on 04/26/2015 showed possible colon mass. On antibiotic therapy now productive cough and chest congestion. EXAM: CT CHEST, ABDOMEN, AND PELVIS WITH CONTRAST TECHNIQUE: Multidetector CT imaging of the chest, abdomen and pelvis was performed following the standard protocol during bolus administration of intravenous contrast. CONTRAST:  136mL OMNIPAQUE IOHEXOL 300 MG/ML  SOLN COMPARISON:  CT abdomen pelvis 03/10/2015 and CT chest 02/10/2015. FINDINGS: CT CHEST FINDINGS Mediastinum/Lymph Nodes: No pathologically enlarged mediastinal, hilar or axillary lymph nodes. Coronary artery calcification. Heart is enlarged. No pericardial effusion. Moderate to large hiatal hernia. Lungs/Pleura: Large right pleural effusion and small to moderate left pleural effusion. Image quality is degraded by respiratory motion. Mild centrilobular  emphysema. New collapse/ consolidation in the right middle lobe. Compressive atelectasis in the right lower lobe. Scattered left lung nodules measure up to 4 mm in the left upper lobe (image 13), stable. Airway is unremarkable. Musculoskeletal: No worrisome lytic or sclerotic lesions. Degenerative changes are seen in the spine. There are several right lateral rib fractures, some of which appear new from the prior exam and may be healing. 102 in new left posterolateral rib fractures, also with evidence of healing. CT ABDOMEN PELVIS FINDINGS Hepatobiliary: Liver is somewhat heterogeneous in attenuation. Gallbladder is unremarkable. No biliary ductal dilatation. Pancreas: Negative. Spleen: Surgical clips in the splenic hilum. Spleen is otherwise unremarkable. Adrenals/Urinary Tract: Adrenal glands are unremarkable. Low-attenuation lesions in the kidneys measure up to 3.4 cm on the left and are likely cysts. At least 1 is too small to definitively characterize in the right kidney (series 8, image 18). There is decreased parenchymal attenuation in the upper/mid left kidney on nephrographic phase imaging, suggesting decreased function. Small left renal stone. Ureters are decompressed. Bladder is low in volume. Stomach/Bowel: Moderate to large hiatal hernia with postoperative changes. Stomach and small bowel are otherwise unremarkable. Left lower quadrant description a solid lesion is seen in the ascending colon, measuring approximately 2.9 x 3.0 cm (series 2, image 71), previously approximately 2.4 x 2.6 cm. Left lower quadrant colostomy. Colon is otherwise unremarkable. Vascular/Lymphatic: Atherosclerotic calcification of the arterial vasculature without abdominal aortic aneurysm. No pathologically enlarged lymph nodes. Reproductive: Uterus and ovaries are visualized. Other: An oblong collection of fluid and air is seen in the low left anatomic pelvis, adjacent to the residual sigmoid colon, measuring approximately 3.4 x  8.8 cm (series 2, image 91), decreased from approximately 3.6 x 11.2 cm. An adjacent collection of organized fluid in the low left iliac fossa is smaller, now measuring approximately 3.0 x 3.2 cm (series 2, images 86-88). These 2 collections may be communicative. No extraluminal oral contrast is seen in either collection. Mesenteries and peritoneum are  otherwise unremarkable. Musculoskeletal: No worrisome lytic or sclerotic lesions. Degenerative changes are seen in the spine. IMPRESSION: 1. Slight enlargement of an ascending colon mass. Per report, patient underwent colonoscopy on 04/26/2015. 2. New abscess in the low left anatomic pelvis. Adjacent abscess in the low left iliac fossa is smaller. These collections may be communicative. 3. Large right and small to moderate left pleural effusions. 4. New collapse/consolidation in the right middle lobe, possibly due to pneumonia. Attention on followup exams is warranted. 5. Several bilateral rib fractures, some of which appear new/healing. 6. Small left renal stone. 7. Moderate to large hiatal hernia. Electronically Signed   By: Lorin Picket M.D.   On: 05/01/2015 11:25   *I have personally reviewed the images above*  EKG: Ordered, not yet done.   Assessment/Plan:   Principal Problem:   Pelvic abscess in female - Interventional radiology consulted for draining. - Send cultures of pus. - Blood cultures 2. - Empiric vancomycin and Zosyn ordered.  Active Problems:   Stage III chronic kidney disease - Check renal function.    Coronary artery disease with history of PTCA - Continue beta blocker and statin. No aspirin in the setting of potential GI bleeding.    PAF - Anticoagulation currently on hold secondary to chronic blood loss anemia. - Continue metoprolol for rate control. - Check EKG.    HYPERCHOLESTEROLEMIA - Continue Crestor.    HTN (hypertension) - Continue Cozaar and metoprolol.    Edema - Significant lower extremity edema  noted. Check albumin and renal function.  - 2-D echo done 03/05/15: EF 55-60 percent with no regional wall motion abnormalities.     Hiatal hernia - Continue PPI.      lobar pneumonia, unspecified organism/HCAP (healthcare-associated pneumonia) - Mildly symptomatic. On empiric vancomycin and Zosyn.  - Send sputum culture.  - Check strep pneumonia antigen.  - Mucinex and antitussives ordered as needed.     Colonic mass - Will need surgical evaluation.     Anemia due to chronic blood loss - Check hemoglobin and hematocrit. Heme check stools.  - Check anemia panel.     Multiple fractures of ribs, bilateral, initial encounter for closed fracture - Secondary to frequent falls, likely from polypharmacy/prescription drug abuse.  - Physical therapy evaluation requested.     History of depression, anxiety and prescription drug abuse - Continue Celexa. - Continue low-dose Ativan twice a day when necessary.    DVT prophylaxis -  SCDs ordered.  Code Status / Family Communication / Disposition Plan:   Code Status: Full. Family Communication: Colleen Can (daughter): [phone]. Disposition Plan: Home when stable.  Attestation regarding necessity of inpatient status:   The appropriate admission status for this patient is INPATIENT. Inpatient status is judged to be reasonable and necessary in order to provide the required intensity of service to ensure the patient's safety. The patient's presenting symptoms, physical exam findings, and initial radiographic and laboratory data in the context of their chronic comorbidities is felt to place them at high risk for further clinical deterioration. Furthermore, it is not anticipated that the patient will be medically stable for discharge from the hospital within 2 midnights of admission. The following factors support the admission status of inpatient.   -The patient's presenting symptoms include weakness, fatigue, shortness of breath, cough,  nausea. - The worrisome physical exam findings include diminished breath sounds right base, 3+ lower extremity edema. - The initial radiographic and laboratory data are worrisome because of pelvic abscess and lobar pneumonia noted  on CT of the chest, abdomen and pelvis. - The chronic co-morbidities include Prescription drug abuse with frequent falls, status post recent colostomy for treatment of ruptured diverticulitis, coronary artery disease, PAF. - Patient requires inpatient status due to high intensity of service, high risk for further deterioration and high frequency of surveillance required. - I certify that at the point of admission it is my clinical judgment that the patient will require inpatient hospital care spanning beyond 2 midnights from the point of admission.   Time spent: 70 minutes.   Rhoda Waldvogel [facility] Pager [phone] Cell: [phone]   If 7PM-7AM, please contact night-coverage [email or web address] Password [email or web address] 05/02/2015, 12:13 PM

## 2015-05-02 NOTE — Consult Note (Signed)
Chief Complaint: pelvic abscess Referring Physician:Dr. Margreta Journey Rama HPI: [name] is an 81 y.o. female who underwent a Hartman's procedure by Dr. Aviva Signs on 03-05-15 for perforated diverticulitis.  She states she has done well since the surgery with no complications or problems except fatigue.  She has been noted to have an ascending colon mass and had a follow up CT scan for this yesterday by Dr. Hilarie Fredrickson who is her primary GI doctor.  On this scan, she was incidentally found to have a pelvic fluid collection.  She was sent to [facility] today for admission for drainage of this abscess.  She does take Eliquis at home and has not taken any since her tablet yesterday morning.    Past Medical History:  Past Medical History  Diagnosis Date  . Depressive disorder, not elsewhere classified   . Anxiety state, unspecified   . Pure hypercholesterolemia   . Esophageal reflux   . Infection of esophagostomy ([place])   . Congestive heart failure, unspecified     diastolic heart failure  . Pulmonary hypertension (HCC)     PA systolic pressure 13-08 mmHg by echocardiogram, PA pressure 33/10 by cardiac catheterization PA saturation 62% thermodilution cardiac index 2.0 thick cardiac index 2.4  . Bradycardia     previously with coreg  . Tricuspid regurgitation     moderate tricuspid regurgitation by echocardiogram, prior use of anorexic agents  . Melanoma ([place]) 1995    removal on back   . Coronary artery disease     a. Moderate to severe coronary disease involving the left anterior descending artery and right coronary artery.  Sequential stenosis in the LAD is significant.  Right coronary artery is moderate to severely likely nonischemic. Questionable small LVOT obstruction.  No Brockenbrough maneuver was performed.  Catheterization January 2013; b. 05/2014 MV no isch/infarct, EF 60%.  Marland Kitchen PONV (postoperative nausea and vomiting)   . Hypertension   . H/O hiatal hernia   . Gastritis   . [name]'s  esophagus   . IBS (irritable bowel syndrome)   . Macular degeneration   . Dysrhythmia   . Diverticulitis   . Perforated bowel ([place])   . Anemia     post operative  . Atrial fibrillation with RVR ([place]) 09/06/2012  . Perforated sigmoid colon ([place]) 03/03/2015  . Cellulitis of extremity 09/06/2012    Past Surgical History:  Past Surgical History  Procedure Laterality Date  . Nissen fundoplication  6578  . Rotator cuff repair  ~ 2001    right  . Coronary angioplasty with stent placement  05/08/11    "1"  . Tonsillectomy  ~ 1944  . Shoulder arthroscopy  ~ 2004; 2005    left; "joint's wore out"  . Cataract extraction w/ intraocular lens  implant, bilateral  ~ 2002  . Tubal ligation  1972  . Percutaneous coronary stent intervention (pci-s) N/A 05/08/2011    Procedure: PERCUTANEOUS CORONARY STENT INTERVENTION (PCI-S);  Surgeon: Wellington Hampshire, MD;  Location: [facility] CATH LAB;  Service: Cardiovascular;  Laterality: N/A;  . Esophageal manometry N/A 06/06/2014    Procedure: ESOPHAGEAL MANOMETRY (EM);  Surgeon: Jerene Bears, MD;  Location: [facility] ENDOSCOPY;  Service: Gastroenterology;  Laterality: N/A;  . Laparoscopic nissen fundoplication N/A 4/69/6295    Procedure: LAPAROSCOPIC  LYSIS OF ADHESIONS, SEGMENTAL GASTRECTOMY, PARTIAL REDUCTION OF HERNIA;  Surgeon: [name], MD;  Location: [facility] ORS;  Service: General;  Laterality: N/A;  . Hernia repair    . Colectomy with colostomy  creation/hartmann procedure N/A 03/05/2015    Procedure: PARTIAL COLECTOMY WITH COLOSTOMY CREATION/HARTMANN PROCEDURE;  Surgeon: Aviva Signs, MD;  Location: [facility] ORS;  Service: General;  Laterality: N/A;  . Cardiac catheterization Left 03/05/2015    Procedure: CENTRAL LINE INSERTION;  Surgeon: Aviva Signs, MD;  Location: [facility] ORS;  Service: General;  Laterality: Left;    Family History:  Family History  Problem Relation Age of Onset  . Heart attack Father   . Hypertension Father   . Diabetes Mother   . Hypertension Mother   .  Colon cancer Neg Hx   . Esophageal cancer Neg Hx   . Rectal cancer Neg Hx   . Stomach cancer Neg Hx   . Skin cancer Brother     melanoma    Social History:  reports that she quit smoking about 27 years ago. Her smoking use included Cigarettes. She started smoking about 56 years ago. She has a 20 pack-year smoking history. She has never used smokeless tobacco. She reports that she does not drink alcohol or use illicit drugs.  Allergies:  Allergies  Allergen Reactions  . Sulfamethoxazole Rash    Medications:   Medication List    ASK your doctor about these medications        ALPRAZolam 0.25 MG tablet  Commonly known as:  XANAX  Take 1 tablet (0.25 mg total) by mouth 2 (two) times daily as needed for anxiety.     citalopram 40 MG tablet  Commonly known as:  CELEXA  Take 0.5 tablets (20 mg total) by mouth daily.     esomeprazole 40 MG capsule  Commonly known as:  NEXIUM  Take 40 mg by mouth daily before breakfast.     feeding supplement (ENSURE ENLIVE) Liqd  Take 237 mLs by mouth 2 (two) times daily between meals.     ferrous sulfate 325 (65 FE) MG EC tablet  Take 325 mg by mouth 3 (three) times daily.     losartan 100 MG tablet  Commonly known as:  COZAAR  Take 1 tablet (100 mg total) by mouth daily.     metoprolol tartrate 25 MG tablet  Commonly known as:  LOPRESSOR  Take 1 tablet (25 mg total) by mouth 2 (two) times daily.     nitroGLYCERIN 0.4 MG SL tablet  Commonly known as:  NITROSTAT  Place 0.4 mg under the tongue every 5 (five) minutes as needed for chest pain. Reported on 04/26/2015     ondansetron 4 MG disintegrating tablet  Commonly known as:  ZOFRAN ODT  95m ODT q4 hours prn nausea/vomit     oxyCODONE-acetaminophen 5-325 MG tablet  Commonly known as:  PERCOCET/ROXICET  Take 1 tablet by mouth every 6 (six) hours as needed.     PRESERVISION AREDS Tabs  Take 1 tablet by mouth 2 (two) times daily.     PROAIR HFA 108 (90 Base) MCG/ACT inhaler  Generic  drug:  albuterol  Inhale 2 puffs into the lungs every 4 (four) hours.     rosuvastatin 10 MG tablet  Commonly known as:  CRESTOR  Take 10 mg by mouth at bedtime.     SYSTANE BALANCE OP  Place 1-2 drops into both eyes daily as needed (dry eyes). Reported on 04/26/2015     traZODone 100 MG tablet  Commonly known as:  DESYREL  Take 100 mg by mouth at bedtime.     Vitamin D (Ergocalciferol) 50000 units Caps capsule  Commonly known as:  DRISDOL  Take  50,000 Units by mouth every Tuesday.        Please HPI for pertinent positives, otherwise complete 10 system ROS negative.  Mallampati Score: MD Evaluation Airway: WNL Heart: WNL Abdomen: WNL Chest/ Lungs: WNL ASA  Classification: 3 Mallampati/Airway Score: Two  Physical Exam: BP 176/66 mmHg  Pulse 67  Temp(Src) 98.9 F (37.2 C) (Oral)  Resp 20  Ht _0  (1.575 m)  Wt 148 lb 11.2 oz (67.45 kg)  BMI 27.19 kg/m2  SpO2 98% Body mass index is 27.19 kg/(m^2). General: pleasant, WD, WN white female who is laying in bed in NAD HEENT: head is normocephalic, atraumatic.  Sclera are noninjected.  PERRL.  Ears and nose without any masses or lesions.  Mouth is pink and moist Heart: regular, rate, and rhythm.  Normal s1,s2. No obvious murmurs, gallops, or rubs noted.  Palpable radial and pedal pulses bilaterally Lungs: CTAB, no wheezes, rhonchi, or rales noted.  Respiratory effort nonlabored Abd: soft, NT, ND, +BS, no masses, hernias, or organomegaly.  LLQ colostomy in place with good feculent output MS: all 4 extremities are symmetrical with no cyanosis, clubbing, or edema. Psych: A&Ox3 with an appropriate affect.   Labs: Results for orders placed or performed during the hospital encounter of 05/02/15 (from the past 48 hour(s))  Comprehensive metabolic panel     Status: Abnormal   Collection Time: 05/02/15 12:37 PM  Result Value Ref Range   Sodium 140 135 - 145 mmol/L   Potassium 3.5 3.5 - 5.1 mmol/L   Chloride 107 101 - 111  mmol/L   CO2 22 22 - 32 mmol/L   Glucose, Bld 86 65 - 99 mg/dL   BUN 14 6 - 20 mg/dL   Creatinine, Ser 1.30 (H) 0.44 - 1.00 mg/dL   Calcium 8.2 (L) 8.9 - 10.3 mg/dL   Total Protein 5.8 (L) 6.5 - 8.1 g/dL   Albumin 2.4 (L) 3.5 - 5.0 g/dL   AST 12 (L) 15 - 41 U/L   ALT 8 (L) 14 - 54 U/L   Alkaline Phosphatase 90 38 - 126 U/L   Total Bilirubin 0.4 0.3 - 1.2 mg/dL   GFR calc non Af Amer 38 (L) >60 mL/min   GFR calc Af Amer 44 (L) >60 mL/min    Comment: (NOTE) The eGFR has been calculated using the CKD EPI equation. This calculation has not been validated in all clinical situations. eGFR's persistently <60 mL/min signify possible Chronic Kidney Disease.    Anion gap 11 5 - 15  CBC WITH DIFFERENTIAL     Status: Abnormal   Collection Time: 05/02/15 12:37 PM  Result Value Ref Range   WBC 9.0 4.0 - 10.5 K/uL   RBC 3.21 (L) 3.87 - 5.11 MIL/uL   Hemoglobin 7.8 (L) 12.0 - 15.0 g/dL   HCT 25.4 (L) 36.0 - 46.0 %   MCV 79.1 78.0 - 100.0 fL   MCH 24.3 (L) 26.0 - 34.0 pg   MCHC 30.7 30.0 - 36.0 g/dL   RDW 17.4 (H) 11.5 - 15.5 %   Platelets 263 150 - 400 K/uL   Neutrophils Relative % 70 %   Neutro Abs 6.2 1.7 - 7.7 K/uL   Lymphocytes Relative 25 %   Lymphs Abs 2.2 0.7 - 4.0 K/uL   Monocytes Relative 5 %   Monocytes Absolute 0.5 0.1 - 1.0 K/uL   Eosinophils Relative 0 %   Eosinophils Absolute 0.0 0.0 - 0.7 K/uL   Basophils Relative 0 %   Basophils  Absolute 0.0 0.0 - 0.1 K/uL  Reticulocytes     Status: Abnormal   Collection Time: 05/02/15 12:37 PM  Result Value Ref Range   Retic Ct Pct 3.2 (H) 0.4 - 3.1 %   RBC. 3.21 (L) 3.87 - 5.11 MIL/uL   Retic Count, Manual 102.7 19.0 - 186.0 K/uL  Occult blood card to lab, stool RN will collect     Status: Abnormal   Collection Time: 05/02/15  2:20 PM  Result Value Ref Range   Fecal Occult Bld POSITIVE (A) NEGATIVE    Imaging: Ct Chest W Contrast  05/01/2015  CLINICAL DATA:  Surgery for ruptured colon on 03/05/2015. Colonoscopy on 04/26/2015  showed possible colon mass. On antibiotic therapy now productive cough and chest congestion. EXAM: CT CHEST, ABDOMEN, AND PELVIS WITH CONTRAST TECHNIQUE: Multidetector CT imaging of the chest, abdomen and pelvis was performed following the standard protocol during bolus administration of intravenous contrast. CONTRAST:  154m OMNIPAQUE IOHEXOL 300 MG/ML  SOLN COMPARISON:  CT abdomen pelvis 03/10/2015 and CT chest 02/10/2015. FINDINGS: CT CHEST FINDINGS Mediastinum/Lymph Nodes: No pathologically enlarged mediastinal, hilar or axillary lymph nodes. Coronary artery calcification. Heart is enlarged. No pericardial effusion. Moderate to large hiatal hernia. Lungs/Pleura: Large right pleural effusion and small to moderate left pleural effusion. Image quality is degraded by respiratory motion. Mild centrilobular emphysema. New collapse/ consolidation in the right middle lobe. Compressive atelectasis in the right lower lobe. Scattered left lung nodules measure up to 4 mm in the left upper lobe (image 13), stable. Airway is unremarkable. Musculoskeletal: No worrisome lytic or sclerotic lesions. Degenerative changes are seen in the spine. There are several right lateral rib fractures, some of which appear new from the prior exam and may be healing. 102 in new left posterolateral rib fractures, also with evidence of healing. CT ABDOMEN PELVIS FINDINGS Hepatobiliary: Liver is somewhat heterogeneous in attenuation. Gallbladder is unremarkable. No biliary ductal dilatation. Pancreas: Negative. Spleen: Surgical clips in the splenic hilum. Spleen is otherwise unremarkable. Adrenals/Urinary Tract: Adrenal glands are unremarkable. Low-attenuation lesions in the kidneys measure up to 3.4 cm on the left and are likely cysts. At least 1 is too small to definitively characterize in the right kidney (series 8, image 18). There is decreased parenchymal attenuation in the upper/mid left kidney on nephrographic phase imaging, suggesting  decreased function. Small left renal stone. Ureters are decompressed. Bladder is low in volume. Stomach/Bowel: Moderate to large hiatal hernia with postoperative changes. Stomach and small bowel are otherwise unremarkable. Left lower quadrant description a solid lesion is seen in the ascending colon, measuring approximately 2.9 x 3.0 cm (series 2, image 71), previously approximately 2.4 x 2.6 cm. Left lower quadrant colostomy. Colon is otherwise unremarkable. Vascular/Lymphatic: Atherosclerotic calcification of the arterial vasculature without abdominal aortic aneurysm. No pathologically enlarged lymph nodes. Reproductive: Uterus and ovaries are visualized. Other: An oblong collection of fluid and air is seen in the low left anatomic pelvis, adjacent to the residual sigmoid colon, measuring approximately 3.4 x 8.8 cm (series 2, image 91), decreased from approximately 3.6 x 11.2 cm. An adjacent collection of organized fluid in the low left iliac fossa is smaller, now measuring approximately 3.0 x 3.2 cm (series 2, images 86-88). These 2 collections may be communicative. No extraluminal oral contrast is seen in either collection. Mesenteries and peritoneum are otherwise unremarkable. Musculoskeletal: No worrisome lytic or sclerotic lesions. Degenerative changes are seen in the spine. IMPRESSION: 1. Slight enlargement of an ascending colon mass. Per report, patient underwent colonoscopy  on 04/26/2015. 2. New abscess in the low left anatomic pelvis. Adjacent abscess in the low left iliac fossa is smaller. These collections may be communicative. 3. Large right and small to moderate left pleural effusions. 4. New collapse/consolidation in the right middle lobe, possibly due to pneumonia. Attention on followup exams is warranted. 5. Several bilateral rib fractures, some of which appear new/healing. 6. Small left renal stone. 7. Moderate to large hiatal hernia. Electronically Signed   By: Lorin Picket M.D.   On:  05/01/2015 11:25   Ct Abdomen Pelvis W Contrast  05/01/2015  CLINICAL DATA:  Surgery for ruptured colon on 03/05/2015. Colonoscopy on 04/26/2015 showed possible colon mass. On antibiotic therapy now productive cough and chest congestion. EXAM: CT CHEST, ABDOMEN, AND PELVIS WITH CONTRAST TECHNIQUE: Multidetector CT imaging of the chest, abdomen and pelvis was performed following the standard protocol during bolus administration of intravenous contrast. CONTRAST:  191m OMNIPAQUE IOHEXOL 300 MG/ML  SOLN COMPARISON:  CT abdomen pelvis 03/10/2015 and CT chest 02/10/2015. FINDINGS: CT CHEST FINDINGS Mediastinum/Lymph Nodes: No pathologically enlarged mediastinal, hilar or axillary lymph nodes. Coronary artery calcification. Heart is enlarged. No pericardial effusion. Moderate to large hiatal hernia. Lungs/Pleura: Large right pleural effusion and small to moderate left pleural effusion. Image quality is degraded by respiratory motion. Mild centrilobular emphysema. New collapse/ consolidation in the right middle lobe. Compressive atelectasis in the right lower lobe. Scattered left lung nodules measure up to 4 mm in the left upper lobe (image 13), stable. Airway is unremarkable. Musculoskeletal: No worrisome lytic or sclerotic lesions. Degenerative changes are seen in the spine. There are several right lateral rib fractures, some of which appear new from the prior exam and may be healing. 102 in new left posterolateral rib fractures, also with evidence of healing. CT ABDOMEN PELVIS FINDINGS Hepatobiliary: Liver is somewhat heterogeneous in attenuation. Gallbladder is unremarkable. No biliary ductal dilatation. Pancreas: Negative. Spleen: Surgical clips in the splenic hilum. Spleen is otherwise unremarkable. Adrenals/Urinary Tract: Adrenal glands are unremarkable. Low-attenuation lesions in the kidneys measure up to 3.4 cm on the left and are likely cysts. At least 1 is too small to definitively characterize in the right  kidney (series 8, image 18). There is decreased parenchymal attenuation in the upper/mid left kidney on nephrographic phase imaging, suggesting decreased function. Small left renal stone. Ureters are decompressed. Bladder is low in volume. Stomach/Bowel: Moderate to large hiatal hernia with postoperative changes. Stomach and small bowel are otherwise unremarkable. Left lower quadrant description a solid lesion is seen in the ascending colon, measuring approximately 2.9 x 3.0 cm (series 2, image 71), previously approximately 2.4 x 2.6 cm. Left lower quadrant colostomy. Colon is otherwise unremarkable. Vascular/Lymphatic: Atherosclerotic calcification of the arterial vasculature without abdominal aortic aneurysm. No pathologically enlarged lymph nodes. Reproductive: Uterus and ovaries are visualized. Other: An oblong collection of fluid and air is seen in the low left anatomic pelvis, adjacent to the residual sigmoid colon, measuring approximately 3.4 x 8.8 cm (series 2, image 91), decreased from approximately 3.6 x 11.2 cm. An adjacent collection of organized fluid in the low left iliac fossa is smaller, now measuring approximately 3.0 x 3.2 cm (series 2, images 86-88). These 2 collections may be communicative. No extraluminal oral contrast is seen in either collection. Mesenteries and peritoneum are otherwise unremarkable. Musculoskeletal: No worrisome lytic or sclerotic lesions. Degenerative changes are seen in the spine. IMPRESSION: 1. Slight enlargement of an ascending colon mass. Per report, patient underwent colonoscopy on 04/26/2015. 2. New abscess  in the low left anatomic pelvis. Adjacent abscess in the low left iliac fossa is smaller. These collections may be communicative. 3. Large right and small to moderate left pleural effusions. 4. New collapse/consolidation in the right middle lobe, possibly due to pneumonia. Attention on followup exams is warranted. 5. Several bilateral rib fractures, some of which  appear new/healing. 6. Small left renal stone. 7. Moderate to large hiatal hernia. Electronically Signed   By: Lorin Picket M.D.   On: 05/01/2015 11:25    Assessment/Plan 1. Pelvic abscess, s/p Hartman's procedure on 03-05-15 by Dr. Aviva Signs -Eliquis needs to be held for 48hrs.  I have d/w Dr. Barbie Banner who is agreeable to proceed with drain placement tomorrow which will be 48hrs since her last dose. -NPO p MN tonight with plans for procedure tomorrow -check labs in am -Risks and Benefits discussed with the patient including bleeding, infection, damage to adjacent structures, bowel perforation/fistula connection, and sepsis. All of the patient's questions were answered, patient is agreeable to proceed. Consent signed and in chart.   Thank you for this interesting consult.  I greatly enjoyed meeting Merle R Colan and look forward to participating in their care.  A copy of this report was sent to the requesting provider on this date.  Electronically Signed: Henreitta Cea 05/02/2015, 2:52 PM   I spent a total of 40 Minutes   in face to face in clinical consultation, greater than 50% of which was counseling/coordinating care for pelvic abscess, need for perc drain

## 2015-05-02 NOTE — Progress Notes (Signed)
Called to patient room to assess for prn BD. Wheezing upon exam. 2.5mg  albuterol administered. Patient agrees she is "breathing easier". Home med list noted for Q4 hour BD via [facility]I. Patient denies this to be so, but does state she was diagnosed by her PCP on this past Friday with bronchitis and she was told to use her inhaler Q4 hours as needed. RT protocol assessment completed. Orders changed according to acuity. RT will continue to follow.

## 2015-05-02 NOTE — Progress Notes (Signed)
Subjective:    Patient ID: [name], female    DOB: 1934-10-05, 81 y.o.   MRN: [id number]  HPI [name] is a  81 year old female with recent perforated sigmoid diverticulosis, complicated by intra-abdominal fluid collections status post Hartman's procedure with end colostomy,  Ascending colon mass found to be at least tubular adenoma, history of large hiatal hernia with partial reduction and segmental gastrectomy who is here for follow-up. She is here today with her daughter. She was in the office on 04/18/2015 and then came for colonoscopy last week. This showed a large ascending colon mass which was biopsied. This was found to be tubular adenoma , with possible sampling bias.   This was followed by repeat CT scan of the chest abdomen and pelvis which was performed yesterday. She is also had issues with anemia after her hospitalization and colon surgery which we have been following. There's been no overt bleeding, she's been on iron supplementation and hemoglobin has increased slightly. She remains on Eliquis,  But stopped her dose yesterday after I called to discuss her CT scan. She was started on Levaquin by primary care on Friday for "bronchitis".   She has continued to feel rundown and weak. Appetite has been up and down. She's had a cough but no fever. Bowel movements through the ostomy have been working better on MiraLAX and she has had frank diarrhea since contrast for CT scan yesterday. She denies shortness of breath or dyspnea today.  She denies blood through her ostomy or melena. No recent  Vomiting though she frequently has nausea.   CT scan revealed probable right sided pneumonia. Organized pelvic abscess. Ascending colon mass. Large right pleural effusion and small left pleural effusion.  Review of Systems  as per history of present illness, otherwise negative  Current Medications, Allergies, Past Medical History, Past Surgical History, Family History and Social History were  reviewed in [employer] record.     Objective:   Physical Exam BP 170/88 mmHg  Pulse 70  Wt 149 lb 6.4 oz (67.767 kg) Constitutional: Well-developed  Though chronically ill-appearing female in No distress. HEENT: Normocephalic and atraumatic. Oropharynx is clear and moist. No oropharyngeal exudate. Conjunctivae are normal.  No scleral icterus. Neck: Neck supple. Trachea midline. Cardiovascular: Normal rate, regular rhythm and intact distal pulses. No M/R/G Pulmonary/chest: Effort normal with dramatically decreased breath sounds on the right side middle and lower lobe,  Left lobe clear Abdominal: Soft, nontender, nondistended. Bowel sounds active throughout. Ostomy left middle quadrant Extremities: no clubbing, cyanosis, or edema Neurological: Alert and oriented to person place and time. Skin: Skin is warm,  pale and dry.  Psychiatric: Normal mood and affect. Behavior is normal.   EXAM: CT CHEST, ABDOMEN, AND PELVIS WITH CONTRAST   TECHNIQUE: Multidetector CT imaging of the chest, abdomen and pelvis was performed following the standard protocol during bolus administration of intravenous contrast.   CONTRAST:  180mL OMNIPAQUE IOHEXOL 300 MG/ML  SOLN   COMPARISON:  CT abdomen pelvis 03/10/2015 and CT chest 02/10/2015.   FINDINGS: CT CHEST FINDINGS   Mediastinum/Lymph Nodes: No pathologically enlarged mediastinal, hilar or axillary lymph nodes. Coronary artery calcification. Heart is enlarged. No pericardial effusion. Moderate to large hiatal hernia.   Lungs/Pleura: Large right pleural effusion and small to moderate left pleural effusion. Image quality is degraded by respiratory motion. Mild centrilobular emphysema. New collapse/ consolidation in the right middle lobe. Compressive atelectasis in the right lower lobe. Scattered left lung nodules measure up  to 4 mm in the left upper lobe (image 13), stable. Airway is unremarkable.   Musculoskeletal: No  worrisome lytic or sclerotic lesions. Degenerative changes are seen in the spine. There are several right lateral rib fractures, some of which appear new from the prior exam and may be healing. 102 in new left posterolateral rib fractures, also with evidence of healing.   CT ABDOMEN PELVIS FINDINGS   Hepatobiliary: Liver is somewhat heterogeneous in attenuation. Gallbladder is unremarkable. No biliary ductal dilatation.   Pancreas: Negative.   Spleen: Surgical clips in the splenic hilum. Spleen is otherwise unremarkable.   Adrenals/Urinary Tract: Adrenal glands are unremarkable. Low-attenuation lesions in the kidneys measure up to 3.4 cm on the left and are likely cysts. At least 1 is too small to definitively characterize in the right kidney (series 8, image 18). There is decreased parenchymal attenuation in the upper/mid left kidney on nephrographic phase imaging, suggesting decreased function. Small left renal stone. Ureters are decompressed. Bladder is low in volume.   Stomach/Bowel: Moderate to large hiatal hernia with postoperative changes. Stomach and small bowel are otherwise unremarkable. Left lower quadrant description a solid lesion is seen in the ascending colon, measuring approximately 2.9 x 3.0 cm (series 2, image 71), previously approximately 2.4 x 2.6 cm. Left lower quadrant colostomy. Colon is otherwise unremarkable.   Vascular/Lymphatic: Atherosclerotic calcification of the arterial vasculature without abdominal aortic aneurysm. No pathologically enlarged lymph nodes.   Reproductive: Uterus and ovaries are visualized.   Other: An oblong collection of fluid and air is seen in the low left anatomic pelvis, adjacent to the residual sigmoid colon, measuring approximately 3.4 x 8.8 cm (series 2, image 91), decreased from approximately 3.6 x 11.2 cm. An adjacent collection of organized fluid in the low left iliac fossa is smaller, now measuring approximately 3.0  x 3.2 cm (series 2, images 86-88). These 2 collections may be communicative. No extraluminal oral contrast is seen in either collection. Mesenteries and peritoneum are otherwise unremarkable.   Musculoskeletal: No worrisome lytic or sclerotic lesions. Degenerative changes are seen in the spine.   IMPRESSION: 1. Slight enlargement of an ascending colon mass. Per report, patient underwent colonoscopy on 04/26/2015. 2. New abscess in the low left anatomic pelvis. Adjacent abscess in the low left iliac fossa is smaller. These collections may be communicative. 3. Large right and small to moderate left pleural effusions. 4. New collapse/consolidation in the right middle lobe, possibly due to pneumonia. Attention on followup exams is warranted. 5. Several bilateral rib fractures, some of which appear new/healing. 6. Small left renal stone. 7. Moderate to large hiatal hernia.     Electronically Signed   By: Lorin Picket M.D.   On: 05/01/2015 11:25  CBC Latest Ref Rng 04/26/2015 04/21/2015 04/17/2015  WBC 4.0 - 10.5 K/uL 16.9(H) 12.5(H) 12.6(H)  Hemoglobin 12.0 - 15.0 g/dL 8.2 Repeated and verified X2.(L) 7.5 Repeated and verified X2.(LL) 7.4 cL(LL)  Hematocrit 36.0 - 46.0 % 25.9 Repeated and verified X2.(L) 24.0 Repeated and verified X2.(L) 23.9 aL(LL)  Platelets 150.0 - 400.0 K/uL 302.0 312.0 312.0    CMP     Component Value Date/Time   NA 131* 03/24/2015 0600   K 3.7 03/24/2015 0600   CL 98* 03/24/2015 0600   CO2 25 03/24/2015 0600   GLUCOSE 93 03/24/2015 0600   BUN 14 03/24/2015 0600   CREATININE 0.99 03/24/2015 0600   CALCIUM 7.9* 03/24/2015 0600   PROT 6.0* 03/20/2015 0700   ALBUMIN 2.6* 03/20/2015 0700  AST 13* 03/20/2015 0700   ALT 10* 03/20/2015 0700   ALKPHOS 57 03/20/2015 0700   BILITOT 0.5 03/20/2015 0700   GFRNONAA 53* 03/24/2015 0600   GFRAA >60 03/24/2015 0600      Assessment & Plan:  81 year old female with recent perforated sigmoid diverticulosis,  complicated by intra-abdominal fluid collections status post Hartman's procedure with end colostomy,  Ascending colon mass found to be at least tubular adenoma, history of large hiatal hernia with partial reduction and segmental gastrectomy who is here for follow-up.  1.  Pelvic abscess --  I discussed the CT findings with Dr. Anselm Pancoast with IR yesterday.   He feels that this will be accessible and drainable by posterior approach. This will need to be done off Eliquis and in the setting of IV antibiotics. Given the overall complexity of her current medical condition, I have recommended admission to the hospitalist service. She will need IV antibiotics and IR consultation for drainage of this abscess. She likely will then need , eventually, a tube study to be sure this abscess does not communicate with bowel.   2. Right colon mass --  At least tubular adenoma, cannot exclude malignancy. I discussed this with Dr. Zella Richer who plans to see the patient to discuss completion colectomy with probable end ileostomy. This appointment is scheduled for 05/11/2015. Definitive treatment of pelvic abscess prior to this consultation   3. Right lobe pneumonia --  On Levaquin, needs IV antibiotics. Being admitted for this issue   4. Anemia --  Improving without overt bleeding. Needs iron replacement which she has been doing orally. This is likely postsurgical an apartment related to chronic disease. I cannot exclude that the large polyp in the right colon is bleeding or oozing intermittently.   5. Leukocytosis --  Likely secondary to problem #1 and 3. Planning IV antibiotics in the hospital   I'm contacting [facility] hospitalist for admission

## 2015-05-02 NOTE — Consult Note (Addendum)
WOC ostomy consult note Stoma type/location: LLQ Colostomy. Previously seen by my partner, M. Austin.   Stomal assessment/size: Visualized through pouch-approximately 1 and 3/8 inches. Peristomal assessment: not seen today Treatment options for stomal/peristomal skin: skin barrier ring Output soft brown stool Ostomy pouching: 1pc. With skin barrier ring  Education provided: Extended session with both daughters Olivia Mackie by phone) outside the room.  Patient reports that everything is "fine", but daughters disagree.  State that mother has withdrawn from activities.  Both describe a reluctance on the patient's part to engage in self care. They describe that patient was independent when discharged by [facility], but now allows pouch to over-fill and "explode".  States that their Aunt, the patient's sister reinforces that this is "too hard" for anyone to do by themselves.  Daughters report frustration and fear that mother will not be able to continue living in her apartment, as she must be able to care for herself there. Suggested stand-by assistance only when their mother calls, or to not leave work and encourage their mother to be independent.  Also recommended that they discuss this situation with their physician, either PCP or Surgeon.  Will discuss with [facility] PA tomorrow. Enrolled patient in [place] program: Yes, previously. [facility] nursing team will not follow routinely, but will remain available to this patient, the nursing, surgical and medical teams.  Please re-consult if needed. Thanks, Maudie Flakes, MSN, RN, [place], Arther Abbott  Pager# [phone]  Time spent in counseling session with family = 60 minutes

## 2015-05-03 ENCOUNTER — Encounter (HOSPITAL_COMMUNITY): Payer: Self-pay | Admitting: Radiology

## 2015-05-03 ENCOUNTER — Inpatient Hospital Stay (HOSPITAL_COMMUNITY): Payer: Medicare Other

## 2015-05-03 DIAGNOSIS — D500 Iron deficiency anemia secondary to blood loss (chronic): Secondary | ICD-10-CM

## 2015-05-03 DIAGNOSIS — F418 Other specified anxiety disorders: Secondary | ICD-10-CM

## 2015-05-03 DIAGNOSIS — N732 Unspecified parametritis and pelvic cellulitis: Secondary | ICD-10-CM

## 2015-05-03 LAB — PROTIME-INR
INR: 1.35 (ref 0.00–1.49)
PROTHROMBIN TIME: 16.8 s — AB (ref 11.6–15.2)

## 2015-05-03 MED ORDER — MIDAZOLAM HCL 2 MG/2ML IJ SOLN
INTRAMUSCULAR | Status: AC | PRN
  Administered 2015-05-03: 1 mg via INTRAVENOUS

## 2015-05-03 MED ORDER — MIDAZOLAM HCL 2 MG/2ML IJ SOLN
INTRAMUSCULAR | Status: AC
  Filled 2015-05-03: qty 4

## 2015-05-03 MED ORDER — FENTANYL CITRATE (PF) 100 MCG/2ML IJ SOLN
INTRAMUSCULAR | Status: AC | PRN
Start: 2015-05-03 — End: 2015-05-03
  Administered 2015-05-03: 25 ug via INTRAVENOUS

## 2015-05-03 MED ORDER — FENTANYL CITRATE (PF) 100 MCG/2ML IJ SOLN
INTRAMUSCULAR | Status: AC
  Filled 2015-05-03: qty 2

## 2015-05-03 NOTE — Sedation Documentation (Signed)
Patient denies pain and is resting comfortably.  

## 2015-05-03 NOTE — Procedures (Signed)
Left trans-glueteal abscess drain 12 Fr 20 cc pus No comp/EBL

## 2015-05-03 NOTE — Consult Note (Addendum)
WOC ostomy follow up Stoma type/location: LLQ Colosotomy. Pouch applied on Monday is still intact. Stomal assessment/size: 1 and 3/8 inches as visualized through pouch. Peristomal assessment: Not seen today Treatment options for stomal/peristomal skin: Skin barrier ring Output brown soft stool Ostomy pouching: 1pc. With skin barrier ring Education provided: Patient's younger sister is present today; both daughters are at work. Patient has just returned from IR where a drain has been placed into the abscess.  Patient is comfortable. Request additional ostomy supplies; 5 pouches and 10 rings provided as they have no rings at home. Pouch changes every 2-3 days are advised. Enrolled patient in [facility] Start Discharge program: Yes [facility] nursing team will not follow, but will remain available to this patient, the nursing, surgical and medical teams.  Please re-consult if needed in between visits. Thanks, Maudie Flakes, MSN, RN, [place], Arther Abbott  Pager# [phone]

## 2015-05-03 NOTE — Progress Notes (Signed)
Utilization review completed.  

## 2015-05-03 NOTE — Progress Notes (Signed)
Patient ID: [name], female   DOB: May 18, 1934, 81 y.o.   MRN: [id number]  [facility] PROGRESS NOTE  [name] [id number] DOB: 19-Jun-1934 DOA: 05/02/2015 PCP: Terald Sleeper, PA-C   Brief narrative:    81 y.o. female with recent perforated sigmoid diverticulosis complicated by intra-abdominal fluid collections status post Hartman's procedure with end colostomy. She had a colonoscopy last week which revealed a large ascending colon mass with biopsy + tubular adenoma which will need to be removed. She also has a history of large hiatal hernia with partial reduction and segmental gastrectomy. She has had persistent anemia. She is on Eliquis chronically for a history of atrial fibrillation, but has not taken it the day of the admission upon the advice of Dr. Hilarie Fredrickson. She was seen by Dr. Hilarie Fredrickson for GI follow-up, who ordered a CT scan one day prior, which showed right sided pneumonia and a pelvic abscess. She was referred as a direct admission from his office.  Assessment/Plan:    Principal Problem:  Pelvic abscess in female - Interventional radiology consulted for draining. - continue with Empiric vancomycin and Zosyn day #2  Active Problems:  Stage III chronic kidney disease - monitor renal function    Coronary artery disease with history of PTCA - Continue beta blocker and statin. No aspirin in the setting of potential GI bleeding.   PAF - Anticoagulation currently on hold secondary to chronic blood loss anemia. - Continue metoprolol for rate control. - keep on tele    HYPERCHOLESTEROLEMIA - Continue Crestor.   HTN (hypertension), accelerated  - Continue Cozaar and metoprolol.   Edema - Significant lower extremity edema noted. - 2-D echo done 03/05/15: EF 55-60 percent with no regional wall motion abnormalities.    Hiatal hernia - Continue PPI.   Lobar pneumonia, unspecified organism/HCAP (healthcare-associated pneumonia) - Mildly symptomatic. On  empiric vancomycin and Zosyn.  - sputum culture pending  - Mucinex and antitussives ordered as needed.    Colonic mass - Will need surgical evaluation.    Anemia due to chronic blood loss - transfuse for Hg < 7 - CBC In AM   Multiple fractures of ribs, bilateral, initial encounter for closed fracture - Secondary to frequent falls, likely from polypharmacy/prescription drug abuse.  - Physical therapy evaluation requested.    History of depression, anxiety and prescription drug abuse - Continue Celexa. - Continue low-dose Ativan twice a day when necessary.   DVT prophylaxis - SCDs ordered.  Code Status: Full.  Family Communication:  plan of care discussed with the patient Disposition Plan: Home when abscess cleared and pt off IV ABX  IV access:  Peripheral IV  Procedures and diagnostic studies:     Ct Abdomen Pelvis W Contrast 05/01/2015 Slight enlargement of an ascending colon mass. Per report, patient underwent colonoscopy on 04/26/2015. 2. New abscess in the low left anatomic pelvis. Adjacent abscess in the low left iliac fossa is smaller. These collections may be communicative. 3. Large right and small to moderate left pleural effusions. 4. New collapse/consolidation in the right middle lobe, possibly due to pneumonia. Attention on followup exams is warranted. 5. Several bilateral rib fractures, some of which appear new/healing. 6. Small left renal stone. 7. Moderate to large hiatal hernia.   Ct Image Guided Drainage By Percutaneous Catheter 05/03/2015 Successful left 12 French trans gluteal pelvic abscess drain.   Medical Consultants:  IR  Other Consultants:  PT  IAnti-Infectives:   Vancomycin 1/31 --> Zosyn 1/31 -->  Faye Ramsay, MD  [facility] Pager [phone]  If 7PM-7AM, please contact night-coverage [email or web address] Password [facility] 05/03/2015, 6:47 PM   LOS: 1 day   HPI/Subjective: No events overnight.   Objective: Filed Vitals:   05/03/15 1057  05/03/15 1130 05/03/15 1256 05/03/15 1425  BP: 179/67 173/72 184/72 175/87  Pulse: 67 66 68 81  Temp:  98.1 F (36.7 C) 99.1 F (37.3 C) 98.1 F (36.7 C)  TempSrc:  Oral Oral Oral  Resp: 17 18 20 19   Height:      Weight:      SpO2: 95% 93% 94% 94%    Intake/Output Summary (Last 24 hours) at 05/03/15 1847 Last data filed at 05/03/15 1800  Gross per 24 hour  Intake   2870 ml  Output    750 ml  Net   2120 ml    Exam:   General:  Pt is alert, follows commands appropriately, not in acute distress  Cardiovascular: Regular rate and rhythm, no rubs, no gallops  Respiratory: Clear to auscultation bilaterally, no wheezing, no crackles, no rhonchi  Abdomen: Soft, non tender, non distended, bowel sounds present, no guarding   Data Reviewed: Basic Metabolic Panel:  Recent Labs Lab 05/02/15 1237  NA 140  K 3.5  CL 107  CO2 22  GLUCOSE 86  BUN 14  CREATININE 1.30*  CALCIUM 8.2*   Liver Function Tests:  Recent Labs Lab 05/02/15 1237  AST 12*  ALT 8*  ALKPHOS 90  BILITOT 0.4  PROT 5.8*  ALBUMIN 2.4*   CBC:  Recent Labs Lab 05/02/15 1237  WBC 9.0  NEUTROABS 6.2  HGB 7.8*  HCT 25.4*  MCV 79.1  PLT 263   Recent Results (from the past 240 hour(s))  Culture, blood (routine x 2) Call MD if unable to obtain prior to antibiotics being given     Status: None (Preliminary result)   Collection Time: 05/02/15 12:30 PM  Result Value Ref Range Status   Specimen Description BLOOD RIGHT ARM  Final   Special Requests BOTTLES DRAWN AEROBIC AND ANAEROBIC 6CC  Final   Culture   Final    NO GROWTH < 24 HOURS Performed at [facility]    Report Status PENDING  Incomplete  Culture, blood (routine x 2) Call MD if unable to obtain prior to antibiotics being given     Status: None (Preliminary result)   Collection Time: 05/02/15 12:30 PM  Result Value Ref Range Status   Specimen Description BLOOD LEFT ARM  Final   Special Requests BOTTLES DRAWN AEROBIC AND  ANAEROBIC 6CC  Final   Culture   Final    NO GROWTH < 24 HOURS Performed at [facility]    Report Status PENDING  Incomplete     Scheduled Meds: . albuterol  2.5 mg Nebulization TID  . citalopram  40 mg Oral QHS  . ferrous sulfate  325 mg Oral TID WC  . guaiFENesin  600 mg Oral BID  . losartan  100 mg Oral Daily  . metoprolol tartrate  12.5 mg Oral BID  . multivitamin-lutein  1 capsule Oral Daily  . pantoprazole  40 mg Oral Daily  . piperacillin-tazobactam (ZOSYN)  IV  3.375 g Intravenous 3 times per day  . rosuvastatin  10 mg Oral QHS  . sodium chloride flush  3 mL Intravenous Q12H  . traZODone  100 mg Oral QHS  . vancomycin  1,000 mg Intravenous Q24H  . Vitamin D (Ergocalciferol)  50,000 Units Oral  Q7 days  . zolpidem  5 mg Oral QHS   Continuous Infusions: . sodium chloride 100 mL/hr at 05/03/15 1301

## 2015-05-03 NOTE — Care Management Note (Signed)
[facility] Management Note  Patient Details  Name: [name] MRN: [id number] Date of Birth: 15-Jul-1934  Subjective/Objective: 81 y/o f admitted w/Pelvic abscesses. From home. PT cons-await recc.                   Action/Plan:d/c plan home.   Expected Discharge Date:   (unknown)               Expected Discharge Plan:  [place]  In-House Referral:     Discharge planning Services  CM Consult  Post Acute Care Choice:    Choice offered to:     DME Arranged:    DME Agency:     HH Arranged:    HH Agency:     Status of Service:  In process, will continue to follow  Medicare Important Message Given:    Date Medicare IM Given:    Medicare IM give by:    Date Additional Medicare IM Given:    Additional Medicare Important Message give by:     If discussed at [place] of Stay Meetings, dates discussed:    Additional Comments:  Dessa Phi, RN 05/03/2015, 12:27 PM

## 2015-05-03 NOTE — Progress Notes (Signed)
PT Cancellation Note  Patient Details Name: Porfirio Oar Dacy MRN: [id number] DOB: 06-11-1934   Cancelled Treatment:    Reason Eval/Treat Not Completed: Patient at procedure or test/unavailable. Pt being taken off of the floor by transport for CT.  Will check back as schedule permits.   Shakala Marlatt LUBECK 05/03/2015, 10:07 AM

## 2015-05-04 DIAGNOSIS — N183 Chronic kidney disease, stage 3 (moderate): Secondary | ICD-10-CM

## 2015-05-04 LAB — CBC
HEMATOCRIT: 23.8 % — AB (ref 36.0–46.0)
Hemoglobin: 7.1 g/dL — ABNORMAL LOW (ref 12.0–15.0)
MCH: 23.5 pg — AB (ref 26.0–34.0)
MCHC: 29.8 g/dL — ABNORMAL LOW (ref 30.0–36.0)
MCV: 78.8 fL (ref 78.0–100.0)
Platelets: 228 10*3/uL (ref 150–400)
RBC: 3.02 MIL/uL — AB (ref 3.87–5.11)
RDW: 17.7 % — AB (ref 11.5–15.5)
WBC: 8.7 10*3/uL (ref 4.0–10.5)

## 2015-05-04 LAB — BASIC METABOLIC PANEL
ANION GAP: 7 (ref 5–15)
BUN: 11 mg/dL (ref 6–20)
CO2: 22 mmol/L (ref 22–32)
Calcium: 8 mg/dL — ABNORMAL LOW (ref 8.9–10.3)
Chloride: 110 mmol/L (ref 101–111)
Creatinine, Ser: 1.22 mg/dL — ABNORMAL HIGH (ref 0.44–1.00)
GFR calc Af Amer: 48 mL/min — ABNORMAL LOW (ref >60)
GFR calc non Af Amer: 41 mL/min — ABNORMAL LOW (ref >60)
GLUCOSE: 79 mg/dL (ref 65–99)
POTASSIUM: 3.8 mmol/L (ref 3.5–5.1)
Sodium: 139 mmol/L (ref 135–145)

## 2015-05-04 MED ORDER — POLYETHYLENE GLYCOL 3350 17 G PO PACK
17.0000 g | PACK | Freq: Two times a day (BID) | ORAL | Status: DC
  Administered 2015-05-04 – 2015-05-08 (×6): 17 g via ORAL
  Filled 2015-05-04 (×8): qty 1

## 2015-05-04 MED ORDER — PROSIGHT PO TABS
1.0000 | ORAL_TABLET | Freq: Every day | ORAL | Status: DC
  Administered 2015-05-04 – 2015-05-08 (×5): 1 via ORAL
  Filled 2015-05-04 (×5): qty 1

## 2015-05-04 NOTE — Care Management Note (Signed)
[facility] Management Note  Patient Details  Name: [name] MRN: [id number] Date of Birth: 06/02/1934  Subjective/Objective: [facility] chosen for [employer] aware & following. Patient states she has a benfit w/her insurance for dme-will check w/[facility] rep Pura Spice or benefit check on process for 3n1.                   Action/Plan:d/c plan home w/HHC/DME   Expected Discharge Date:   (unknown)               Expected Discharge Plan:  [place]  In-House Referral:     Discharge planning Services  CM Consult  Post Acute Care Choice:    Choice offered to:  Patient  DME Arranged:    DME Agency:     HH Arranged:  PT [place]:  [place]  Status of Service:  In process, will continue to follow  Medicare Important Message Given:    Date Medicare IM Given:    Medicare IM give by:    Date Additional Medicare IM Given:    Additional Medicare Important Message give by:     If discussed at [place] of Stay Meetings, dates discussed:    Additional Comments:  Dessa Phi, RN 05/04/2015, 1:13 PM

## 2015-05-04 NOTE — Evaluation (Signed)
Physical Therapy Evaluation Patient Details Name: [name] MRN: [id number] DOB: [birth year] Today's Date: 05/04/2015   History of Present Illness  [name] is an 81 y.o. female with recent perforated sigmoid diverticulosis complicated by intra-abdominal fluid collections status post Hartman's procedure with end colostomy. She had a colonoscopy last week which revealed a large ascending colon mass with biopsy + tubular adenoma which will need to be removed. She also has a history of large hiatal hernia with partial reduction and segmental gastrectomy. She has had persistent anemia.The patient does endorse shortness of breath, a cough productive of white sputum and an occasional wheeze. Symptoms have been present for several days. She also continues to feel weak and run down dating back to her original surgery.   Clinical Impression  Pt admitted with above diagnosis. Pt currently with functional limitations due to the deficits listed below (see PT Problem List).  Pt will benefit from skilled PT to increase their independence and safety with mobility to allow discharge to the venue listed below.  Pt fatigued quickly with gait with RW.  Recommend HHPT and 3-1 BSC for home use.    Follow Up Recommendations Home health PT;Supervision - Intermittent    Equipment Recommendations  3in1 (PT)    Recommendations for Other Services       Precautions / Restrictions Precautions Precautions: Fall Precaution Comments: colostomy, drain, 2-3 falls in last month or two Restrictions Weight Bearing Restrictions: No      Mobility  Bed Mobility Overal bed mobility: Needs Assistance Bed Mobility: Supine to Sit     Supine to sit: Supervision;HOB elevated     General bed mobility comments: HOB elevated. No physical A given but slight difficulty  Transfers Overall transfer level: Needs assistance Equipment used: Rolling walker (2 wheeled) Transfers: Sit to/from Stand Sit to Stand: Min guard          General transfer comment: cues for hand placement  Ambulation/Gait Ambulation/Gait assistance: Min guard Ambulation Distance (Feet): 110 Feet Assistive device: Rolling walker (2 wheeled) Gait Pattern/deviations: Step-through pattern;Trunk flexed Gait velocity: decreased   General Gait Details: Pt had 1 small LOB when someone spoke to her, but pt able to self correct with min/guard. Pt fatigued after gait.  Stairs            Wheelchair Mobility    Modified Rankin (Stroke Patients Only)       Balance Overall balance assessment: History of Falls;Needs assistance   Sitting balance-Leahy Scale: Good       Standing balance-Leahy Scale: Poor                               Pertinent Vitals/Pain Pain Assessment: No/denies pain    Home Living Family/patient expects to be discharged to:: Private residence Living Arrangements: Alone Available Help at Discharge: Available 24 hours/day;Family;Friend(s) Type of Home: House Home Access: Level entry     Home Layout: One level Home Equipment: Walker - 2 wheels      Prior Function Level of Independence: Independent with assistive device(s)         Comments: Been using RW since December surgery for colostomy.  Prior to Dec, was very active, played pickle ball     Hand Dominance        Extremity/Trunk Assessment   Upper Extremity Assessment: Overall WFL for tasks assessed           Lower Extremity Assessment: Overall WFL for  tasks assessed;Generalized weakness         Communication   Communication: HOH  Cognition Arousal/Alertness: Awake/alert Behavior During Therapy: WFL for tasks assessed/performed Overall Cognitive Status: Within Functional Limits for tasks assessed                      General Comments General comments (skin integrity, edema, etc.): 1 LOB during gait which she self corrected.    Exercises        Assessment/Plan    PT Assessment Patient needs  continued PT services  PT Diagnosis Difficulty walking   PT Problem List Decreased activity tolerance;Decreased balance;Decreased mobility;Decreased strength  PT Treatment Interventions DME instruction;Gait training;Functional mobility training;Therapeutic activities;Therapeutic exercise;Balance training;Patient/family education   PT Goals (Current goals can be found in the Care Plan section) Acute Rehab PT Goals Patient Stated Goal: to get back to her active lifestyle PT Goal Formulation: With patient Time For Goal Achievement: 05/11/15 Potential to Achieve Goals: Good    Frequency Min 3X/week   Barriers to discharge        Co-evaluation               End of Session   Activity Tolerance: Patient tolerated treatment well;Patient limited by fatigue Patient left: in chair;with call bell/phone within reach;with chair alarm set           Time: [id number] PT Time Calculation (min) (ACUTE ONLY): 21 min   Charges:   PT Evaluation $PT Eval Moderate Complexity: 1 Procedure     PT G Codes:        [name] 05/04/2015, 10:10 AM

## 2015-05-04 NOTE — Clinical Documentation Improvement (Signed)
Internal Medicine  A cause and effect relationship may not be assumed and must be documented by a provider.  Please clarify the relationship, if any, between Pelvic Abscess and recent [facility] Procedure. Document findings in next progress note NOT in BPA drop down box.  Are the conditions:   Due to or associated with each other  Unrelated to each other  Other  Clinically Undetermined  Supporting Information (risk factors, sign and symptoms, diagnostics, treatment):   "Pelvic abscess, s/p Hartman's procedure on 03-05-15 by Dr. Aviva Signs" - Surgical Consult note on 05/01/14   Please exercise your independent, professional judgment when responding. A specific answer is not anticipated or expected.  Thank You,  Zoila Shutter RN, BSN, [place] [phone]; Cell: [phone]

## 2015-05-04 NOTE — Progress Notes (Signed)
Taking over care of pt, agree with previous RN assessment. Pt resting comfortably, will continue to monitor.  

## 2015-05-04 NOTE — Care Management Note (Signed)
[facility] Management Note  Patient Details  Name: Porfirio Oar Sessums MRN: [id number] Date of Birth: May 10, 1934  Subjective/Objective:  [facility] rep Stephanie-will check w/patient's insurance to find out if she has a copay, & will let me know for 3n1.[facility] rep Santiago Glad aware of [place] order.                  Action/Plan:d/c plan home w/HHC/DME.   Expected Discharge Date:   (unknown)               Expected Discharge Plan:  [place]  In-House Referral:     Discharge planning Services  CM Consult  Post Acute Care Choice:    Choice offered to:  Patient  DME Arranged:  3-N-1 DME Agency:  [place]:  PT [place]:  [place]  Status of Service:  In process, will continue to follow  Medicare Important Message Given:    Date Medicare IM Given:    Medicare IM give by:    Date Additional Medicare IM Given:    Additional Medicare Important Message give by:     If discussed at [place] of Stay Meetings, dates discussed:    Additional Comments:  Dessa Phi, RN 05/04/2015, 1:31 PM

## 2015-05-04 NOTE — Progress Notes (Addendum)
Patient ID: [name], female   DOB: 1934-06-15, 81 y.o.   MRN: [id number]  [facility] PROGRESS NOTE  [name] [id number] DOB: Aug 13, 1934 DOA: 05/02/2015 PCP: Terald Sleeper, PA-C   Brief narrative:    81 y.o. female with recent perforated sigmoid diverticulosis complicated by intra-abdominal fluid collections status post Hartman's procedure with end colostomy. She had a colonoscopy last week which revealed a large ascending colon mass with biopsy + tubular adenoma which will need to be removed. She also has a history of large hiatal hernia with partial reduction and segmental gastrectomy. She has had persistent anemia. She is on Eliquis chronically for a history of atrial fibrillation, but has not taken it the day of the admission upon the advice of Dr. Hilarie Fredrickson. She was seen by Dr. Hilarie Fredrickson for GI follow-up, who ordered a CT scan one day prior, which showed right sided pneumonia and a pelvic abscess. She was referred as a direct admission from his office.  Assessment/Plan:    Principal Problem:  Pelvic abscess in female s/p Hartman's procedure  - s/p drain, per IR, post op day #1 - continue with Empiric vancomycin and Zosyn day #3  Active Problems:  Stage III chronic kidney disease - monitor renal function, Cr is trending down  - BMP in AM   Coronary artery disease with history of PTCA - Continue beta blocker and statin. No aspirin in the setting of potential GI bleeding.   PAF - Anticoagulation currently on hold secondary to drop in Hg  - Continue metoprolol for rate control. - keep on tele    HYPERCHOLESTEROLEMIA - Continue Crestor.   HTN (hypertension), accelerated  - Continue Cozaar and metoprolol. - added hydralazine as needed    Edema - Significant lower extremity edema noted. - 2-D echo done 03/05/15: EF 55-60 percent with no regional wall motion abnormalities.  - somewhat better this AM    Hiatal hernia - Continue PPI.   Lobar  pneumonia, unspecified organism/HCAP (healthcare-associated pneumonia) - Mildly symptomatic. On empiric vancomycin and Zosyn.  - sputum culture pending  - Mucinex and antitussives ordered as needed.    Colonic mass - Will need surgical evaluation.    Anemia due to chronic blood loss - transfuse for Hg < 7 - CBC In AM   Multiple fractures of ribs, bilateral, initial encounter for closed fracture - Secondary to frequent falls, likely from polypharmacy/prescription drug abuse.  - Physical therapy evaluation requested. HH PT recommended    History of depression, anxiety and prescription drug abuse - Continue Celexa. - Continue low-dose Ativan twice a day when necessary. - stable for now   DVT prophylaxis - SCDs ordered.  Code Status: Full.  Family Communication:  plan of care discussed with the patient Disposition Plan: Home when abscess cleared and pt off IV ABX  IV access:  Peripheral IV  Procedures and diagnostic studies:     Ct Abdomen Pelvis W Contrast 05/01/2015 Slight enlargement of an ascending colon mass. Per report, patient underwent colonoscopy on 04/26/2015. 2. New abscess in the low left anatomic pelvis. Adjacent abscess in the low left iliac fossa is smaller. These collections may be communicative. 3. Large right and small to moderate left pleural effusions. 4. New collapse/consolidation in the right middle lobe, possibly due to pneumonia. Attention on followup exams is warranted. 5. Several bilateral rib fractures, some of which appear new/healing. 6. Small left renal stone. 7. Moderate to large hiatal hernia.   Ct Image Guided Drainage By Percutaneous  Catheter 05/03/2015 Successful left 12 French trans gluteal pelvic abscess drain.   Medical Consultants:  IR  Other Consultants:  PT  IAnti-Infectives:   Vancomycin 1/31 --> Zosyn 1/31 -->   Faye Ramsay, MD  [facility] Pager [phone]  If 7PM-7AM, please contact  night-coverage [email or web address] Password [email or web address] 05/04/2015, 1:28 PM   LOS: 2 days   HPI/Subjective: No events overnight.   Objective: Filed Vitals:   05/04/15 [id number] 05/04/15 0643 05/04/15 0754 05/04/15 0935  BP: 183/79 182/70  174/79  Pulse: 74   82  Temp: 98.5 F (36.9 C)     TempSrc: Oral     Resp: 18     Height:      Weight:      SpO2: 93%  93%     Intake/Output Summary (Last 24 hours) at 05/04/15 1328 Last data filed at 05/04/15 1100  Gross per 24 hour  Intake 2205.83 ml  Output    815 ml  Net 1390.83 ml    Exam:   General:  Pt is alert, follows commands appropriately, not in acute distress  Cardiovascular: Regular rate and rhythm, no rubs, no gallops  Respiratory: Clear to auscultation bilaterally, no wheezing, no crackles, no rhonchi  Abdomen: Soft, non tender, non distended, bowel sounds present, no guarding   Data Reviewed: Basic Metabolic Panel:  Recent Labs Lab 05/02/15 1237 05/04/15 0510  NA 140 139  K 3.5 3.8  CL 107 110  CO2 22 22  GLUCOSE 86 79  BUN 14 11  CREATININE 1.30* 1.22*  CALCIUM 8.2* 8.0*   Liver Function Tests:  Recent Labs Lab 05/02/15 1237  AST 12*  ALT 8*  ALKPHOS 90  BILITOT 0.4  PROT 5.8*  ALBUMIN 2.4*   CBC:  Recent Labs Lab 05/02/15 1237 05/04/15 0510  WBC 9.0 8.7  NEUTROABS 6.2  --   HGB 7.8* 7.1*  HCT 25.4* 23.8*  MCV 79.1 78.8  PLT 263 228   Recent Results (from the past 240 hour(s))  Culture, blood (routine x 2) Call MD if unable to obtain prior to antibiotics being given     Status: None (Preliminary result)   Collection Time: 05/02/15 12:30 PM  Result Value Ref Range Status   Specimen Description BLOOD RIGHT ARM  Final   Special Requests BOTTLES DRAWN AEROBIC AND ANAEROBIC 6CC  Final   Culture   Final    NO GROWTH < 24 HOURS Performed at [facility]    Report Status PENDING  Incomplete  Culture, blood (routine x 2) Call MD if unable to obtain prior to antibiotics being given      Status: None (Preliminary result)   Collection Time: 05/02/15 12:30 PM  Result Value Ref Range Status   Specimen Description BLOOD LEFT ARM  Final   Special Requests BOTTLES DRAWN AEROBIC AND ANAEROBIC 6CC  Final   Culture   Final    NO GROWTH < 24 HOURS Performed at [facility]    Report Status PENDING  Incomplete  Culture, routine-abscess     Status: None (Preliminary result)   Collection Time: 05/03/15 11:54 AM  Result Value Ref Range Status   Specimen Description ABSCESS LT TRANSQLUTEAL  Final   Special Requests NONE  Final   Gram Stain   Final    MODERATE WBC PRESENT,BOTH PMN AND MONONUCLEAR NO SQUAMOUS EPITHELIAL CELLS SEEN ABUNDANT GRAM POSITIVE COCCI IN PAIRS ABUNDANT GRAM NEGATIVE RODS Performed at [facility]    Culture NO GROWTH Performed at [facility]   Final   Report Status PENDING  Incomplete     Scheduled Meds: . albuterol  2.5 mg Nebulization TID  . citalopram  40 mg Oral QHS  . ferrous sulfate  325 mg Oral TID WC  . guaiFENesin  600 mg Oral BID  . losartan  100 mg Oral Daily  . metoprolol tartrate  12.5 mg Oral BID  . multivitamin  1 tablet Oral Daily  . pantoprazole  40 mg Oral Daily  . piperacillin-tazobactam (ZOSYN)  IV  3.375 g Intravenous 3 times per day  . rosuvastatin  10 mg Oral QHS  . sodium chloride flush  3 mL Intravenous Q12H  . traZODone  100 mg Oral QHS  . vancomycin  1,000 mg Intravenous Q24H  . Vitamin D (Ergocalciferol)  50,000 Units Oral Q7 days  . zolpidem  5 mg Oral QHS   Continuous Infusions: . sodium chloride 50 mL/hr at 05/03/15 1931

## 2015-05-04 NOTE — Progress Notes (Signed)
Patient ID: [name], female   DOB: [birth year], 81 y.o.   MRN: [id number]    Referring Physician(s): Rama  Chief Complaint: Pelvic abscess, s/p Hartmans in December  Subjective: Patient feels well.  On regular diet.  Pain is minimal  Allergies: Sulfamethoxazole  Medications: Prior to Admission medications   Medication Sig Start Date End Date Taking? Authorizing Provider  albuterol (PROAIR HFA) 108 (90 BASE) MCG/ACT inhaler Inhale 2 puffs into the lungs every 4 (four) hours.    Yes Historical Provider, MD  ALPRAZolam (XANAX) 0.25 MG tablet Take 1 tablet (0.25 mg total) by mouth 2 (two) times daily as needed for anxiety. 03/15/15  Yes Estill Dooms, MD  citalopram (CELEXA) 40 MG tablet Take 0.5 tablets (20 mg total) by mouth daily. Patient taking differently: Take 40 mg by mouth at bedtime.  09/12/12  Yes Nimish Luther Parody, MD  esomeprazole (NEXIUM) 40 MG capsule Take 40 mg by mouth daily before breakfast.    Yes Historical Provider, MD  ferrous sulfate 325 (65 FE) MG EC tablet Take 325 mg by mouth 3 (three) times daily.    Yes Historical Provider, MD  losartan (COZAAR) 100 MG tablet Take 1 tablet (100 mg total) by mouth daily. 11/11/12  Yes Herminio Commons, MD  metoprolol tartrate (LOPRESSOR) 25 MG tablet Take 1 tablet (25 mg total) by mouth 2 (two) times daily. Patient taking differently: Take 12.5 mg by mouth 2 (two) times daily.  03/14/15  Yes Albertine Patricia, MD  Multiple Vitamins-Minerals (PRESERVISION AREDS) TABS Take 1 tablet by mouth 2 (two) times daily.    Yes Historical Provider, MD  nitroGLYCERIN (NITROSTAT) 0.4 MG SL tablet Place 0.4 mg under the tongue every 5 (five) minutes as needed for chest pain. Reported on 04/26/2015   Yes Historical Provider, MD  ondansetron (ZOFRAN ODT) 4 MG disintegrating tablet 4mg  ODT q4 hours prn nausea/vomit 02/10/15  Yes Milton Ferguson, MD  Propylene Glycol (SYSTANE BALANCE OP) Place 1-2 drops into both eyes daily as needed (dry eyes).  Reported on 04/26/2015   Yes Historical Provider, MD  rosuvastatin (CRESTOR) 10 MG tablet Take 10 mg by mouth at bedtime.    Yes Historical Provider, MD  traZODone (DESYREL) 100 MG tablet Take 100 mg by mouth at bedtime.    Yes Historical Provider, MD  Vitamin D, Ergocalciferol, (DRISDOL) 50000 UNITS CAPS Take 50,000 Units by mouth every Tuesday.    Yes Historical Provider, MD  feeding supplement, ENSURE ENLIVE, (ENSURE ENLIVE) LIQD Take 237 mLs by mouth 2 (two) times daily between meals. Patient not taking: Reported on 05/02/2015 03/14/15   Silver Huguenin Elgergawy, MD  oxyCODONE-acetaminophen (PERCOCET/ROXICET) 5-325 MG tablet Take 1 tablet by mouth every 6 (six) hours as needed. Patient not taking: Reported on 05/02/2015 03/15/15   Estill Dooms, MD    Vital Signs: BP 182/70 mmHg  Pulse 74  Temp(Src) 98.5 F (36.9 C) (Oral)  Resp 18  Ht 5\' 2"  (1.575 m)  Wt 148 lb 11.2 oz (67.45 kg)  BMI 27.19 kg/m2  SpO2 93%  Physical Exam: Abd: soft, NT, ND, drain in place with minimal seropurulent drainage currently. (105cc/24hrs) drain site c/d/i  Imaging: Ct Chest W Contrast  05/01/2015  CLINICAL DATA:  Surgery for ruptured colon on 03/05/2015. Colonoscopy on 04/26/2015 showed possible colon mass. On antibiotic therapy now productive cough and chest congestion. EXAM: CT CHEST, ABDOMEN, AND PELVIS WITH CONTRAST TECHNIQUE: Multidetector CT imaging of the chest, abdomen and pelvis was performed following the  standard protocol during bolus administration of intravenous contrast. CONTRAST:  162mL OMNIPAQUE IOHEXOL 300 MG/ML  SOLN COMPARISON:  CT abdomen pelvis 03/10/2015 and CT chest 02/10/2015. FINDINGS: CT CHEST FINDINGS Mediastinum/Lymph Nodes: No pathologically enlarged mediastinal, hilar or axillary lymph nodes. Coronary artery calcification. Heart is enlarged. No pericardial effusion. Moderate to large hiatal hernia. Lungs/Pleura: Large right pleural effusion and small to moderate left pleural effusion.  Image quality is degraded by respiratory motion. Mild centrilobular emphysema. New collapse/ consolidation in the right middle lobe. Compressive atelectasis in the right lower lobe. Scattered left lung nodules measure up to 4 mm in the left upper lobe (image 13), stable. Airway is unremarkable. Musculoskeletal: No worrisome lytic or sclerotic lesions. Degenerative changes are seen in the spine. There are several right lateral rib fractures, some of which appear new from the prior exam and may be healing. 102 in new left posterolateral rib fractures, also with evidence of healing. CT ABDOMEN PELVIS FINDINGS Hepatobiliary: Liver is somewhat heterogeneous in attenuation. Gallbladder is unremarkable. No biliary ductal dilatation. Pancreas: Negative. Spleen: Surgical clips in the splenic hilum. Spleen is otherwise unremarkable. Adrenals/Urinary Tract: Adrenal glands are unremarkable. Low-attenuation lesions in the kidneys measure up to 3.4 cm on the left and are likely cysts. At least 1 is too small to definitively characterize in the right kidney (series 8, image 18). There is decreased parenchymal attenuation in the upper/mid left kidney on nephrographic phase imaging, suggesting decreased function. Small left renal stone. Ureters are decompressed. Bladder is low in volume. Stomach/Bowel: Moderate to large hiatal hernia with postoperative changes. Stomach and small bowel are otherwise unremarkable. Left lower quadrant description a solid lesion is seen in the ascending colon, measuring approximately 2.9 x 3.0 cm (series 2, image 71), previously approximately 2.4 x 2.6 cm. Left lower quadrant colostomy. Colon is otherwise unremarkable. Vascular/Lymphatic: Atherosclerotic calcification of the arterial vasculature without abdominal aortic aneurysm. No pathologically enlarged lymph nodes. Reproductive: Uterus and ovaries are visualized. Other: An oblong collection of fluid and air is seen in the low left anatomic pelvis,  adjacent to the residual sigmoid colon, measuring approximately 3.4 x 8.8 cm (series 2, image 91), decreased from approximately 3.6 x 11.2 cm. An adjacent collection of organized fluid in the low left iliac fossa is smaller, now measuring approximately 3.0 x 3.2 cm (series 2, images 86-88). These 2 collections may be communicative. No extraluminal oral contrast is seen in either collection. Mesenteries and peritoneum are otherwise unremarkable. Musculoskeletal: No worrisome lytic or sclerotic lesions. Degenerative changes are seen in the spine. IMPRESSION: 1. Slight enlargement of an ascending colon mass. Per report, patient underwent colonoscopy on 04/26/2015. 2. New abscess in the low left anatomic pelvis. Adjacent abscess in the low left iliac fossa is smaller. These collections may be communicative. 3. Large right and small to moderate left pleural effusions. 4. New collapse/consolidation in the right middle lobe, possibly due to pneumonia. Attention on followup exams is warranted. 5. Several bilateral rib fractures, some of which appear new/healing. 6. Small left renal stone. 7. Moderate to large hiatal hernia. Electronically Signed   By: Lorin Picket M.D.   On: 05/01/2015 11:25   Ct Abdomen Pelvis W Contrast  05/01/2015  CLINICAL DATA:  Surgery for ruptured colon on 03/05/2015. Colonoscopy on 04/26/2015 showed possible colon mass. On antibiotic therapy now productive cough and chest congestion. EXAM: CT CHEST, ABDOMEN, AND PELVIS WITH CONTRAST TECHNIQUE: Multidetector CT imaging of the chest, abdomen and pelvis was performed following the standard protocol during bolus administration  of intravenous contrast. CONTRAST:  137mL OMNIPAQUE IOHEXOL 300 MG/ML  SOLN COMPARISON:  CT abdomen pelvis 03/10/2015 and CT chest 02/10/2015. FINDINGS: CT CHEST FINDINGS Mediastinum/Lymph Nodes: No pathologically enlarged mediastinal, hilar or axillary lymph nodes. Coronary artery calcification. Heart is enlarged. No  pericardial effusion. Moderate to large hiatal hernia. Lungs/Pleura: Large right pleural effusion and small to moderate left pleural effusion. Image quality is degraded by respiratory motion. Mild centrilobular emphysema. New collapse/ consolidation in the right middle lobe. Compressive atelectasis in the right lower lobe. Scattered left lung nodules measure up to 4 mm in the left upper lobe (image 13), stable. Airway is unremarkable. Musculoskeletal: No worrisome lytic or sclerotic lesions. Degenerative changes are seen in the spine. There are several right lateral rib fractures, some of which appear new from the prior exam and may be healing. 102 in new left posterolateral rib fractures, also with evidence of healing. CT ABDOMEN PELVIS FINDINGS Hepatobiliary: Liver is somewhat heterogeneous in attenuation. Gallbladder is unremarkable. No biliary ductal dilatation. Pancreas: Negative. Spleen: Surgical clips in the splenic hilum. Spleen is otherwise unremarkable. Adrenals/Urinary Tract: Adrenal glands are unremarkable. Low-attenuation lesions in the kidneys measure up to 3.4 cm on the left and are likely cysts. At least 1 is too small to definitively characterize in the right kidney (series 8, image 18). There is decreased parenchymal attenuation in the upper/mid left kidney on nephrographic phase imaging, suggesting decreased function. Small left renal stone. Ureters are decompressed. Bladder is low in volume. Stomach/Bowel: Moderate to large hiatal hernia with postoperative changes. Stomach and small bowel are otherwise unremarkable. Left lower quadrant description a solid lesion is seen in the ascending colon, measuring approximately 2.9 x 3.0 cm (series 2, image 71), previously approximately 2.4 x 2.6 cm. Left lower quadrant colostomy. Colon is otherwise unremarkable. Vascular/Lymphatic: Atherosclerotic calcification of the arterial vasculature without abdominal aortic aneurysm. No pathologically enlarged lymph  nodes. Reproductive: Uterus and ovaries are visualized. Other: An oblong collection of fluid and air is seen in the low left anatomic pelvis, adjacent to the residual sigmoid colon, measuring approximately 3.4 x 8.8 cm (series 2, image 91), decreased from approximately 3.6 x 11.2 cm. An adjacent collection of organized fluid in the low left iliac fossa is smaller, now measuring approximately 3.0 x 3.2 cm (series 2, images 86-88). These 2 collections may be communicative. No extraluminal oral contrast is seen in either collection. Mesenteries and peritoneum are otherwise unremarkable. Musculoskeletal: No worrisome lytic or sclerotic lesions. Degenerative changes are seen in the spine. IMPRESSION: 1. Slight enlargement of an ascending colon mass. Per report, patient underwent colonoscopy on 04/26/2015. 2. New abscess in the low left anatomic pelvis. Adjacent abscess in the low left iliac fossa is smaller. These collections may be communicative. 3. Large right and small to moderate left pleural effusions. 4. New collapse/consolidation in the right middle lobe, possibly due to pneumonia. Attention on followup exams is warranted. 5. Several bilateral rib fractures, some of which appear new/healing. 6. Small left renal stone. 7. Moderate to large hiatal hernia. Electronically Signed   By: Lorin Picket M.D.   On: 05/01/2015 11:25   Ct Image Guided Drainage By Percutaneous Catheter  05/03/2015  CLINICAL DATA:  Pelvic abscess EXAM: CT IMAGE GUIDED DRAINAGE BY PERCUTANEOUS CATHETER FLUOROSCOPY TIME:  none MEDICATIONS AND MEDICAL HISTORY: Versed One mg, Fentanyl 25 mcg. Additional Medications: . ANESTHESIA/SEDATION: Moderate sedation time: 14 minutes CONTRAST:  Zero PROCEDURE: The procedure, risks, benefits, and alternatives were explained to the patient. Questions regarding the procedure  were encouraged and answered. The patient understands and consents to the procedure. The left gluteal was prepped with ChloraPrep in a  sterile fashion, and a sterile drape was applied covering the operative field. A sterile gown and sterile gloves were used for the procedure. Under CT guidance, an 18 gauge needle was inserted into the pelvic abscess via left trans gluteal approach. It was removed over an Amplatz wire. A 12 French dilator followed by a 12 [place] straight were inserted. It was looped and string fixed. Frank pus was aspirated. FINDINGS: Images document 23 French left trans gluteal pelvic abscess drain. COMPLICATIONS: Successful left 12 French trans gluteal pelvic abscess drain. Electronically Signed   By: Marybelle Killings M.D.   On: 05/03/2015 16:11    Labs:  CBC:  Recent Labs  04/21/15 0846 04/26/15 1146 05/02/15 1237 05/04/15 0510  WBC 12.5* 16.9* 9.0 8.7  HGB 7.5 Repeated and verified X2.* 8.2 Repeated and verified X2.* 7.8* 7.1*  HCT 24.0 Repeated and verified X2.* 25.9 Repeated and verified X2.* 25.4* 23.8*  PLT 312.0 302.0 263 228    COAGS:  Recent Labs  09/26/14 0650 03/02/15 2045 03/03/15 0626 05/03/15 0556  INR 0.97 1.07 1.28 1.35    BMP:  Recent Labs  03/20/15 0700 03/24/15 0600 05/02/15 1237 05/04/15 0510  NA 134* 131* 140 139  K 4.0 3.7 3.5 3.8  CL 101 98* 107 110  CO2 27 25 22 22   GLUCOSE 94 93 86 79  BUN 6 14 14 11   CALCIUM 8.6* 7.9* 8.2* 8.0*  CREATININE 0.89 0.99 1.30* 1.22*  GFRNONAA >60 53* 38* 41*  GFRAA >60 >60 44* 48*    LIVER FUNCTION TESTS:  Recent Labs  03/06/15 0801 03/07/15 0420 03/20/15 0700 05/02/15 1237  BILITOT 0.9 0.5 0.5 0.4  AST 24 22 13* 12*  ALT 19 16 10* 8*  ALKPHOS 65 64 57 90  PROT 5.3* 4.9* 6.0* 5.8*  ALBUMIN 2.3* 2.1* 2.6* 2.4*    Assessment and Plan: 1. Pelvic abscess, s/p Hartman's in December by Dr. Arnoldo Morale at [facility] drain by Dr. Barbie Banner 2-1 -cont drain and irrigations -will likely be DC home in the next day or so. -she will need to follow up with the IR drain clinic likely for repeat CT scan next week.   Electronically  Signed: Henreitta Cea 05/04/2015, 8:57 AM   I spent a total of 15 Minutes at the the patient's bedside AND on the patient's hospital floor or unit, greater than 50% of which was counseling/coordinating care for pelvic abscess, s/p perc drain

## 2015-05-05 LAB — BASIC METABOLIC PANEL
Anion gap: 8 (ref 5–15)
BUN: 12 mg/dL (ref 6–20)
CHLORIDE: 109 mmol/L (ref 101–111)
CO2: 23 mmol/L (ref 22–32)
Calcium: 8.5 mg/dL — ABNORMAL LOW (ref 8.9–10.3)
Creatinine, Ser: 1.09 mg/dL — ABNORMAL HIGH (ref 0.44–1.00)
GFR calc Af Amer: 55 mL/min — ABNORMAL LOW (ref >60)
GFR calc non Af Amer: 47 mL/min — ABNORMAL LOW (ref >60)
GLUCOSE: 98 mg/dL (ref 65–99)
POTASSIUM: 3.5 mmol/L (ref 3.5–5.1)
Sodium: 140 mmol/L (ref 135–145)

## 2015-05-05 LAB — CBC
HCT: 27.2 % — ABNORMAL LOW (ref 36.0–46.0)
HEMOGLOBIN: 8.2 g/dL — AB (ref 12.0–15.0)
MCH: 24.2 pg — AB (ref 26.0–34.0)
MCHC: 30.1 g/dL (ref 30.0–36.0)
MCV: 80.2 fL (ref 78.0–100.0)
Platelets: 267 10*3/uL (ref 150–400)
RBC: 3.39 MIL/uL — AB (ref 3.87–5.11)
RDW: 18 % — ABNORMAL HIGH (ref 11.5–15.5)
WBC: 8.3 10*3/uL (ref 4.0–10.5)

## 2015-05-05 LAB — CULTURE, ROUTINE-ABSCESS

## 2015-05-05 MED ORDER — SENNOSIDES-DOCUSATE SODIUM 8.6-50 MG PO TABS
1.0000 | ORAL_TABLET | Freq: Two times a day (BID) | ORAL | Status: DC
  Administered 2015-05-05: 1 via ORAL
  Filled 2015-05-05 (×4): qty 1

## 2015-05-05 MED ORDER — HYDRALAZINE HCL 20 MG/ML IJ SOLN
10.0000 mg | INTRAMUSCULAR | Status: DC | PRN
  Administered 2015-05-06 (×2): 10 mg via INTRAVENOUS
  Filled 2015-05-05 (×4): qty 1

## 2015-05-05 MED ORDER — AMOXICILLIN-POT CLAVULANATE 875-125 MG PO TABS
1.0000 | ORAL_TABLET | Freq: Two times a day (BID) | ORAL | Status: DC
  Administered 2015-05-05 – 2015-05-08 (×7): 1 via ORAL
  Filled 2015-05-05 (×7): qty 1

## 2015-05-05 MED ORDER — HYDRALAZINE HCL 10 MG PO TABS
10.0000 mg | ORAL_TABLET | Freq: Three times a day (TID) | ORAL | Status: DC
  Administered 2015-05-05 – 2015-05-06 (×3): 10 mg via ORAL
  Filled 2015-05-05 (×4): qty 1

## 2015-05-05 NOTE — Progress Notes (Addendum)
D- Left arm from just above antecubital to wrist area reddened and edematous.  Antecubital area and above with ecchymosis.  Area marked with a sharpie. A-  Ice pack provided, arm elevated.   R- Will continue to monitor.    D- Elevated BP's throughout shift.  Scheduled Metoprolol and Losartan given.  PRN IV Hydralazine 5mg  ineffective. A- new order for 10mg  PO Hydralazine TID and 10mg  IV Hydralazine PRN.  PO given. R- Will continue to monitor blood pressure.

## 2015-05-05 NOTE — Progress Notes (Addendum)
Patient ID: [name], female   DOB: 1934/10/08, 81 y.o.   MRN: [id number]  [facility] PROGRESS NOTE  [name] [id number] DOB: 1934-11-30 DOA: 05/02/2015 PCP: Terald Sleeper, PA-C   Brief narrative:    81 y.o. female with recent perforated sigmoid diverticulosis complicated by intra-abdominal fluid collections status post Hartman's procedure with end colostomy. She had a colonoscopy last week which revealed a large ascending colon mass with biopsy + tubular adenoma which will need to be removed. She also has a history of large hiatal hernia with partial reduction and segmental gastrectomy. She has had persistent anemia. She is on Eliquis chronically for a history of atrial fibrillation, but has not taken it the day of the admission upon the advice of Dr. Hilarie Fredrickson. She was seen by Dr. Hilarie Fredrickson for GI follow-up, who ordered a CT scan one day prior, which showed right sided pneumonia and a pelvic abscess. She was referred as a direct admission from his office.  Assessment/Plan:    Principal Problem:  Pelvic abscess in female s/p Hartman's procedure  - s/p drain, per IR, post op day #2 - was on empiric Vanc and Zosyn today is day #4, transition to oral Augmentin   Active Problems:  Stage III chronic kidney disease - monitor renal function, Cr is trending down, nearly WNL  - BMP in AM   Coronary artery disease with history of PTCA - Continue beta blocker and statin. No aspirin in the setting of potential GI bleeding.   PAF - Anticoagulation currently on hold secondary to drop in Hg  - Continue metoprolol for rate control. - keep on tele for now    HYPERCHOLESTEROLEMIA - Continue Crestor.   HTN (hypertension), accelerated  - Continue Cozaar and metoprolol. - added hydralazine as needed    Edema - improving LE edema  - 2-D echo done 03/05/15: EF 55-60 percent with no regional wall motion abnormalities.  - monitor    Hiatal hernia - Continue PPI.   Lobar  pneumonia, unspecified organism/HCAP (healthcare-associated pneumonia) - Mildly symptomatic. On empiric vancomycin and Zosyn, will change to oral ABX Augmentin  - sputum culture negative to date  - Mucinex and antitussives ordered as needed.    Colonic mass - Will need surgical evaluation pot discharge    Anemia due to chronic blood loss - transfuse for Hg < 7, Hg is up this AM from 7.1 --> 8.2 - CBC In AM   Multiple fractures of ribs, bilateral, initial encounter for closed fracture - Secondary to frequent falls, likely from polypharmacy/prescription drug abuse.  - Physical therapy evaluation requested. HH PT recommended    History of depression, anxiety and prescription drug abuse - Continue Celexa. - Continue low-dose Ativan twice a day when necessary. - stable for now   DVT prophylaxis - SCDs ordered, possible to resume AC prior to discharge if Hg stable   Code Status: Full.  Family Communication:  plan of care discussed with the patient Disposition Plan: Home when abscess cleared, possibly by 2/5  IV access:  Peripheral IV  Procedures and diagnostic studies:     Ct Abdomen Pelvis W Contrast 05/01/2015 Slight enlargement of an ascending colon mass. Per report, patient underwent colonoscopy on 04/26/2015. 2. New abscess in the low left anatomic pelvis. Adjacent abscess in the low left iliac fossa is smaller. These collections may be communicative. 3. Large right and small to moderate left pleural effusions. 4. New collapse/consolidation in the right middle lobe, possibly due to pneumonia.  Attention on followup exams is warranted. 5. Several bilateral rib fractures, some of which appear new/healing. 6. Small left renal stone. 7. Moderate to large hiatal hernia.   Ct Image Guided Drainage By Percutaneous Catheter 05/03/2015 Successful left 12 French trans gluteal pelvic abscess drain.   Medical Consultants:  IR  Other Consultants:  PT  IAnti-Infectives:   Vancomycin  1/31 --> 2/3 Zosyn 1/31 --> 2/3 Augmentin 2/3 -->  Faye Ramsay, MD  [facility] Pager [phone]  If 7PM-7AM, please contact night-coverage [email or web address] Password [facility] 05/05/2015, 12:33 PM   LOS: 3 days   HPI/Subjective: No events overnight.   Objective: Filed Vitals:   05/04/15 2150 05/05/15 0352 05/05/15 0815 05/05/15 0919  BP: 167/71 172/84 168/73   Pulse: 73 76 74   Temp:  98.3 F (36.8 C) 98.9 F (37.2 C)   TempSrc:  Oral Oral   Resp:  20 20   Height:      Weight:      SpO2:  96%  97%    Intake/Output Summary (Last 24 hours) at 05/05/15 1233 Last data filed at 05/05/15 [id number]  Gross per 24 hour  Intake    710 ml  Output    705 ml  Net      5 ml    Exam:   General:  Pt is alert, follows commands appropriately, not in acute distress  Cardiovascular: Regular rate and rhythm, no rubs, no gallops  Respiratory: Clear to auscultation bilaterally, no wheezing, no crackles, no rhonchi  Abdomen: Soft, non tender, non distended, bowel sounds present, colostomy bag in place   Data Reviewed: Basic Metabolic Panel:  Recent Labs Lab 05/02/15 1237 05/04/15 0510 05/05/15 0538  NA 140 139 140  K 3.5 3.8 3.5  CL 107 110 109  CO2 22 22 23   GLUCOSE 86 79 98  BUN 14 11 12   CREATININE 1.30* 1.22* 1.09*  CALCIUM 8.2* 8.0* 8.5*   Liver Function Tests:  Recent Labs Lab 05/02/15 1237  AST 12*  ALT 8*  ALKPHOS 90  BILITOT 0.4  PROT 5.8*  ALBUMIN 2.4*   CBC:  Recent Labs Lab 05/02/15 1237 05/04/15 0510 05/05/15 0538  WBC 9.0 8.7 8.3  NEUTROABS 6.2  --   --   HGB 7.8* 7.1* 8.2*  HCT 25.4* 23.8* 27.2*  MCV 79.1 78.8 80.2  PLT 263 228 267   Recent Results (from the past 240 hour(s))  Culture, blood (routine x 2) Call MD if unable to obtain prior to antibiotics being given     Status: None (Preliminary result)   Collection Time: 05/02/15 12:30 PM  Result Value Ref Range Status   Specimen Description BLOOD RIGHT ARM  Final   Special Requests BOTTLES  DRAWN AEROBIC AND ANAEROBIC 6CC  Final   Culture   Final    NO GROWTH 2 DAYS Performed at [facility]    Report Status PENDING  Incomplete  Culture, blood (routine x 2) Call MD if unable to obtain prior to antibiotics being given     Status: None (Preliminary result)   Collection Time: 05/02/15 12:30 PM  Result Value Ref Range Status   Specimen Description BLOOD LEFT ARM  Final   Special Requests BOTTLES DRAWN AEROBIC AND ANAEROBIC 6CC  Final   Culture   Final    NO GROWTH 2 DAYS Performed at [facility]    Report Status PENDING  Incomplete  Culture, routine-abscess     Status: None   Collection Time: 05/03/15 11:54  AM  Result Value Ref Range Status   Specimen Description ABSCESS LT TRANSQLUTEAL  Final   Special Requests NONE  Final   Gram Stain   Final    MODERATE WBC PRESENT,BOTH PMN AND MONONUCLEAR NO SQUAMOUS EPITHELIAL CELLS SEEN ABUNDANT GRAM POSITIVE COCCI IN PAIRS ABUNDANT GRAM NEGATIVE RODS Performed at [facility]    Culture   Final    MULTIPLE ORGANISMS PRESENT, NONE PREDOMINANT Note: NO STAPHYLOCOCCUS AUREUS ISOLATED NO GROUP A STREP (S.PYOGENES) ISOLATED Performed at [facility]    Report Status 05/05/2015 FINAL  Final     Scheduled Meds: . albuterol  2.5 mg Nebulization TID  . citalopram  40 mg Oral QHS  . ferrous sulfate  325 mg Oral TID WC  . guaiFENesin  600 mg Oral BID  . losartan  100 mg Oral Daily  . metoprolol tartrate  12.5 mg Oral BID  . multivitamin  1 tablet Oral Daily  . pantoprazole  40 mg Oral Daily  . piperacillin-tazobactam (ZOSYN)  IV  3.375 g Intravenous 3 times per day  . polyethylene glycol  17 g Oral BID  . rosuvastatin  10 mg Oral QHS  . sodium chloride flush  3 mL Intravenous Q12H  . traZODone  100 mg Oral QHS  . vancomycin  1,000 mg Intravenous Q24H  . Vitamin D (Ergocalciferol)  50,000 Units Oral Q7 days  . zolpidem  5 mg Oral QHS   Continuous Infusions:

## 2015-05-05 NOTE — Care Management Important Message (Signed)
Important Message  Patient Details  Name: [name] MRN: [id number] Date of Birth: 07/11/1934   Medicare Important Message Given:  Yes    Camillo Flaming 05/05/2015, 10:09 AMImportant Message  Patient Details  Name: [name] MRN: [id number] Date of Birth: 1935/02/24   Medicare Important Message Given:  Yes    Camillo Flaming 05/05/2015, 10:09 AM

## 2015-05-05 NOTE — Progress Notes (Signed)
Patient ID: [name], female   DOB: [birth year], 81 y.o.   MRN: [id number]    Referring Physician(s): TRH  Chief Complaint:  Pelvic abscess  Subjective:  Pt doing better; sitting up in chair; some dyspnea with exertion  Allergies: Sulfamethoxazole  Medications: Prior to Admission medications   Medication Sig Start Date End Date Taking? Authorizing Provider  albuterol (PROAIR HFA) 108 (90 BASE) MCG/ACT inhaler Inhale 2 puffs into the lungs every 4 (four) hours.    Yes Historical Provider, MD  ALPRAZolam (XANAX) 0.25 MG tablet Take 1 tablet (0.25 mg total) by mouth 2 (two) times daily as needed for anxiety. 03/15/15  Yes Estill Dooms, MD  citalopram (CELEXA) 40 MG tablet Take 0.5 tablets (20 mg total) by mouth daily. Patient taking differently: Take 40 mg by mouth at bedtime.  09/12/12  Yes Nimish Luther Parody, MD  esomeprazole (NEXIUM) 40 MG capsule Take 40 mg by mouth daily before breakfast.    Yes Historical Provider, MD  ferrous sulfate 325 (65 FE) MG EC tablet Take 325 mg by mouth 3 (three) times daily.    Yes Historical Provider, MD  losartan (COZAAR) 100 MG tablet Take 1 tablet (100 mg total) by mouth daily. 11/11/12  Yes Herminio Commons, MD  metoprolol tartrate (LOPRESSOR) 25 MG tablet Take 1 tablet (25 mg total) by mouth 2 (two) times daily. Patient taking differently: Take 12.5 mg by mouth 2 (two) times daily.  03/14/15  Yes Albertine Patricia, MD  Multiple Vitamins-Minerals (PRESERVISION AREDS) TABS Take 1 tablet by mouth 2 (two) times daily.    Yes Historical Provider, MD  nitroGLYCERIN (NITROSTAT) 0.4 MG SL tablet Place 0.4 mg under the tongue every 5 (five) minutes as needed for chest pain. Reported on 04/26/2015   Yes Historical Provider, MD  ondansetron (ZOFRAN ODT) 4 MG disintegrating tablet 4mg  ODT q4 hours prn nausea/vomit 02/10/15  Yes Milton Ferguson, MD  Propylene Glycol (SYSTANE BALANCE OP) Place 1-2 drops into both eyes daily as needed (dry eyes). Reported on  04/26/2015   Yes Historical Provider, MD  rosuvastatin (CRESTOR) 10 MG tablet Take 10 mg by mouth at bedtime.    Yes Historical Provider, MD  traZODone (DESYREL) 100 MG tablet Take 100 mg by mouth at bedtime.    Yes Historical Provider, MD  Vitamin D, Ergocalciferol, (DRISDOL) 50000 UNITS CAPS Take 50,000 Units by mouth every Tuesday.    Yes Historical Provider, MD  feeding supplement, ENSURE ENLIVE, (ENSURE ENLIVE) LIQD Take 237 mLs by mouth 2 (two) times daily between meals. Patient not taking: Reported on 05/02/2015 03/14/15   Silver Huguenin Elgergawy, MD  oxyCODONE-acetaminophen (PERCOCET/ROXICET) 5-325 MG tablet Take 1 tablet by mouth every 6 (six) hours as needed. Patient not taking: Reported on 05/02/2015 03/15/15   Estill Dooms, MD     Vital Signs: BP 168/73 mmHg  Pulse 72  Temp(Src) 97.4 F (36.3 C) (Oral)  Resp 20  Ht 5\' 2"  (1.575 m)  Wt 148 lb 11.2 oz (67.45 kg)  BMI 27.19 kg/m2  SpO2 98%  Physical Exam left TG drain intact, output 90 cc yesterday, about 20 cc in bulb now; drain irrigated today with minimal return; cx's- mult org  Imaging: Ct Image Guided Drainage By Percutaneous Catheter  05/03/2015  CLINICAL DATA:  Pelvic abscess EXAM: CT IMAGE GUIDED DRAINAGE BY PERCUTANEOUS CATHETER FLUOROSCOPY TIME:  none MEDICATIONS AND MEDICAL HISTORY: Versed One mg, Fentanyl 25 mcg. Additional Medications: . ANESTHESIA/SEDATION: Moderate sedation time: 14 minutes CONTRAST:  Zero PROCEDURE: The procedure, risks, benefits, and alternatives were explained to the patient. Questions regarding the procedure were encouraged and answered. The patient understands and consents to the procedure. The left gluteal was prepped with ChloraPrep in a sterile fashion, and a sterile drape was applied covering the operative field. A sterile gown and sterile gloves were used for the procedure. Under CT guidance, an 18 gauge needle was inserted into the pelvic abscess via left trans gluteal approach. It was removed  over an Amplatz wire. A 12 French dilator followed by a 12 [place] straight were inserted. It was looped and string fixed. Frank pus was aspirated. FINDINGS: Images document 76 French left trans gluteal pelvic abscess drain. COMPLICATIONS: Successful left 12 French trans gluteal pelvic abscess drain. Electronically Signed   By: Marybelle Killings M.D.   On: 05/03/2015 16:11    Labs:  CBC:  Recent Labs  04/26/15 1146 05/02/15 1237 05/04/15 0510 05/05/15 0538  WBC 16.9* 9.0 8.7 8.3  HGB 8.2 Repeated and verified X2.* 7.8* 7.1* 8.2*  HCT 25.9 Repeated and verified X2.* 25.4* 23.8* 27.2*  PLT 302.0 263 228 267    COAGS:  Recent Labs  09/26/14 0650 03/02/15 2045 03/03/15 0626 05/03/15 0556  INR 0.97 1.07 1.28 1.35    BMP:  Recent Labs  03/24/15 0600 05/02/15 1237 05/04/15 0510 05/05/15 0538  NA 131* 140 139 140  K 3.7 3.5 3.8 3.5  CL 98* 107 110 109  CO2 25 22 22 23   GLUCOSE 93 86 79 98  BUN 14 14 11 12   CALCIUM 7.9* 8.2* 8.0* 8.5*  CREATININE 0.99 1.30* 1.22* 1.09*  GFRNONAA 53* 38* 41* 47*  GFRAA >60 44* 48* 55*    LIVER FUNCTION TESTS:  Recent Labs  03/06/15 0801 03/07/15 0420 03/20/15 0700 05/02/15 1237  BILITOT 0.9 0.5 0.5 0.4  AST 24 22 13* 12*  ALT 19 16 10* 8*  ALKPHOS 65 64 57 90  PROT 5.3* 4.9* 6.0* 5.8*  ALBUMIN 2.3* 2.1* 2.6* 2.4*    Assessment and Plan: S/p pelvic abscess drainage 2/1; AF; WBC nl; hgb 8.2; creat 1.09; fluid cx- mult org; cont drain irrigation; if output cont to decrease would check f/u CT early next week (may also require drain inj before removal); monitor CXR with bil effusions   Electronically Signed: D. Rowe Robert 05/05/2015, 2:24 PM   I spent a total of 15 minutes at the the patient's bedside AND on the patient's hospital floor or unit, greater than 50% of which was counseling/coordinating care for pelvic abscess drain

## 2015-05-06 DIAGNOSIS — J181 Lobar pneumonia, unspecified organism: Secondary | ICD-10-CM

## 2015-05-06 DIAGNOSIS — S2243XA Multiple fractures of ribs, bilateral, initial encounter for closed fracture: Secondary | ICD-10-CM

## 2015-05-06 DIAGNOSIS — K6389 Other specified diseases of intestine: Secondary | ICD-10-CM

## 2015-05-06 LAB — BASIC METABOLIC PANEL
Anion gap: 9 (ref 5–15)
BUN: 11 mg/dL (ref 6–20)
CALCIUM: 8.2 mg/dL — AB (ref 8.9–10.3)
CO2: 20 mmol/L — AB (ref 22–32)
CREATININE: 1.4 mg/dL — AB (ref 0.44–1.00)
Chloride: 108 mmol/L (ref 101–111)
GFR calc Af Amer: 41 mL/min — ABNORMAL LOW (ref >60)
GFR, EST NON AFRICAN AMERICAN: 35 mL/min — AB (ref >60)
Glucose, Bld: 96 mg/dL (ref 65–99)
Potassium: 3.4 mmol/L — ABNORMAL LOW (ref 3.5–5.1)
Sodium: 137 mmol/L (ref 135–145)

## 2015-05-06 LAB — CBC
HEMATOCRIT: 28 % — AB (ref 36.0–46.0)
Hemoglobin: 8.4 g/dL — ABNORMAL LOW (ref 12.0–15.0)
MCH: 23.9 pg — AB (ref 26.0–34.0)
MCHC: 30 g/dL (ref 30.0–36.0)
MCV: 79.8 fL (ref 78.0–100.0)
PLATELETS: 284 10*3/uL (ref 150–400)
RBC: 3.51 MIL/uL — ABNORMAL LOW (ref 3.87–5.11)
RDW: 18.3 % — AB (ref 11.5–15.5)
WBC: 10.2 10*3/uL (ref 4.0–10.5)

## 2015-05-06 MED ORDER — HYDRALAZINE HCL 25 MG PO TABS
25.0000 mg | ORAL_TABLET | Freq: Three times a day (TID) | ORAL | Status: DC
  Administered 2015-05-06 – 2015-05-08 (×5): 25 mg via ORAL
  Filled 2015-05-06 (×6): qty 1

## 2015-05-06 MED ORDER — POTASSIUM CHLORIDE CRYS ER 20 MEQ PO TBCR
40.0000 meq | EXTENDED_RELEASE_TABLET | Freq: Once | ORAL | Status: AC
  Administered 2015-05-06: 40 meq via ORAL
  Filled 2015-05-06: qty 2

## 2015-05-06 NOTE — Progress Notes (Signed)
Referring Physician(s): TRH  Chief Complaint:  Pelvic abscess Drain placed 2/1  Subjective:  Better daily Drain still with output Eating better  Allergies: Sulfamethoxazole  Medications: Prior to Admission medications   Medication Sig Start Date End Date Taking? Authorizing Provider  albuterol (PROAIR HFA) 108 (90 BASE) MCG/ACT inhaler Inhale 2 puffs into the lungs every 4 (four) hours.    Yes Historical Provider, MD  ALPRAZolam (XANAX) 0.25 MG tablet Take 1 tablet (0.25 mg total) by mouth 2 (two) times daily as needed for anxiety. 03/15/15  Yes Estill Dooms, MD  citalopram (CELEXA) 40 MG tablet Take 0.5 tablets (20 mg total) by mouth daily. Patient taking differently: Take 40 mg by mouth at bedtime.  09/12/12  Yes Nimish Luther Parody, MD  esomeprazole (NEXIUM) 40 MG capsule Take 40 mg by mouth daily before breakfast.    Yes Historical Provider, MD  ferrous sulfate 325 (65 FE) MG EC tablet Take 325 mg by mouth 3 (three) times daily.    Yes Historical Provider, MD  losartan (COZAAR) 100 MG tablet Take 1 tablet (100 mg total) by mouth daily. 11/11/12  Yes Herminio Commons, MD  metoprolol tartrate (LOPRESSOR) 25 MG tablet Take 1 tablet (25 mg total) by mouth 2 (two) times daily. Patient taking differently: Take 12.5 mg by mouth 2 (two) times daily.  03/14/15  Yes Albertine Patricia, MD  Multiple Vitamins-Minerals (PRESERVISION AREDS) TABS Take 1 tablet by mouth 2 (two) times daily.    Yes Historical Provider, MD  nitroGLYCERIN (NITROSTAT) 0.4 MG SL tablet Place 0.4 mg under the tongue every 5 (five) minutes as needed for chest pain. Reported on 04/26/2015   Yes Historical Provider, MD  ondansetron (ZOFRAN ODT) 4 MG disintegrating tablet 4mg  ODT q4 hours prn nausea/vomit 02/10/15  Yes Milton Ferguson, MD  Propylene Glycol (SYSTANE BALANCE OP) Place 1-2 drops into both eyes daily as needed (dry eyes). Reported on 04/26/2015   Yes Historical Provider, MD  rosuvastatin (CRESTOR) 10 MG  tablet Take 10 mg by mouth at bedtime.    Yes Historical Provider, MD  traZODone (DESYREL) 100 MG tablet Take 100 mg by mouth at bedtime.    Yes Historical Provider, MD  Vitamin D, Ergocalciferol, (DRISDOL) 50000 UNITS CAPS Take 50,000 Units by mouth every Tuesday.    Yes Historical Provider, MD  feeding supplement, ENSURE ENLIVE, (ENSURE ENLIVE) LIQD Take 237 mLs by mouth 2 (two) times daily between meals. Patient not taking: Reported on 05/02/2015 03/14/15   Silver Huguenin Elgergawy, MD  oxyCODONE-acetaminophen (PERCOCET/ROXICET) 5-325 MG tablet Take 1 tablet by mouth every 6 (six) hours as needed. Patient not taking: Reported on 05/02/2015 03/15/15   Estill Dooms, MD     Vital Signs: BP 155/61 mmHg  Pulse 84  Temp(Src) 98.3 F (36.8 C) (Oral)  Resp 20  Ht 5\' 2"  (1.575 m)  Wt 148 lb 11.2 oz (67.45 kg)  BMI 27.19 kg/m2  SpO2 96%  Physical Exam  Constitutional: She appears well-nourished.  Abdominal: Soft. Bowel sounds are normal. There is no tenderness.  Skin: Skin is warm and dry.  Skin site of TG pelvic abscess drain is clean and dry NT No bleeding Output 20 cc yesterday Milky white output Cx: multiple orgs afeb  Nursing note and vitals reviewed.   Imaging: Ct Image Guided Drainage By Percutaneous Catheter  05/03/2015  CLINICAL DATA:  Pelvic abscess EXAM: CT IMAGE GUIDED DRAINAGE BY PERCUTANEOUS CATHETER FLUOROSCOPY TIME:  none MEDICATIONS AND MEDICAL HISTORY: Versed  One mg, Fentanyl 25 mcg. Additional Medications: . ANESTHESIA/SEDATION: Moderate sedation time: 14 minutes CONTRAST:  Zero PROCEDURE: The procedure, risks, benefits, and alternatives were explained to the patient. Questions regarding the procedure were encouraged and answered. The patient understands and consents to the procedure. The left gluteal was prepped with ChloraPrep in a sterile fashion, and a sterile drape was applied covering the operative field. A sterile gown and sterile gloves were used for the procedure.  Under CT guidance, an 18 gauge needle was inserted into the pelvic abscess via left trans gluteal approach. It was removed over an Amplatz wire. A 12 French dilator followed by a 12 [place] straight were inserted. It was looped and string fixed. Frank pus was aspirated. FINDINGS: Images document 28 French left trans gluteal pelvic abscess drain. COMPLICATIONS: Successful left 12 French trans gluteal pelvic abscess drain. Electronically Signed   By: Marybelle Killings M.D.   On: 05/03/2015 16:11    Labs:  CBC:  Recent Labs  05/02/15 1237 05/04/15 0510 05/05/15 0538 05/06/15 0555  WBC 9.0 8.7 8.3 10.2  HGB 7.8* 7.1* 8.2* 8.4*  HCT 25.4* 23.8* 27.2* 28.0*  PLT 263 228 267 284    COAGS:  Recent Labs  09/26/14 0650 03/02/15 2045 03/03/15 0626 05/03/15 0556  INR 0.97 1.07 1.28 1.35    BMP:  Recent Labs  05/02/15 1237 05/04/15 0510 05/05/15 0538 05/06/15 0555  NA 140 139 140 137  K 3.5 3.8 3.5 3.4*  CL 107 110 109 108  CO2 22 22 23  20*  GLUCOSE 86 79 98 96  BUN 14 11 12 11   CALCIUM 8.2* 8.0* 8.5* 8.2*  CREATININE 1.30* 1.22* 1.09* 1.40*  GFRNONAA 38* 41* 47* 35*  GFRAA 44* 48* 55* 41*    LIVER FUNCTION TESTS:  Recent Labs  03/06/15 0801 03/07/15 0420 03/20/15 0700 05/02/15 1237  BILITOT 0.9 0.5 0.5 0.4  AST 24 22 13* 12*  ALT 19 16 10* 8*  ALKPHOS 65 64 57 90  PROT 5.3* 4.9* 6.0* 5.8*  ALBUMIN 2.3* 2.1* 2.6* 2.4*    Assessment and Plan:  Pelvic abscess drain intact Will follow Will need Re CT when output 10 cc/24 hrs May need injection before pull If dc with drain----plan for [place] drain clinic follow up  Electronically Signed: Arlys Scatena A 05/06/2015, 12:02 PM   I spent a total of 15 Minutes at the the patient's bedside AND on the patient's hospital floor or unit, greater than 50% of which was counseling/coordinating care for pelvic abscess drain

## 2015-05-06 NOTE — Progress Notes (Addendum)
Patient ID: [name], female   DOB: 07-30-1934, 81 y.o.   MRN: [id number]  [facility] PROGRESS NOTE  Porfirio Oar Spoto [id number] DOB: November 11, 1934 DOA: 05/02/2015 PCP: Terald Sleeper, PA-C   Brief narrative:    80 y.o. female with recent perforated sigmoid diverticulosis complicated by intra-abdominal fluid collections status post Hartman's procedure with end colostomy. She had a colonoscopy last week which revealed a large ascending colon mass with biopsy + tubular adenoma which will need to be removed. She also has a history of large hiatal hernia with partial reduction and segmental gastrectomy. She has had persistent anemia. She is on Eliquis chronically for a history of atrial fibrillation, but has not taken it the day of the admission upon the advice of Dr. Hilarie Fredrickson. She was seen by Dr. Hilarie Fredrickson for GI follow-up, who ordered a CT scan one day prior, which showed right sided pneumonia and a pelvic abscess. She was referred as a direct admission from his office.  Assessment/Plan:    Principal Problem:  Pelvic abscess in female likely related to Hartman's procedure, post op complication  - s/p drain, per IR, post op day #3 - was on empiric Vanc and Zosyn today is day #4, transition to oral Augmentin 2/3 - continue same ABX - Will need Repeat CT when output 10 cc/24 hrs  Active Problems:  Stage III chronic kidney disease - monitor renal function, Cr is trending up in the past 24 hours - not clear if related to pre renal etiology vs vancomycin - stop Losartan for now until renal function stabilizes  - give small bolus NS - BMP in AM   Coronary artery disease with history of PTCA - Continue beta blocker and statin. No aspirin in the setting of potential GI bleeding.   PAF - Anticoagulation currently on hold secondary to drop in Hg and high risk fall - Continue metoprolol for rate control. - keep on tele for now    HYPERCHOLESTEROLEMIA - Continue Crestor.   HTN  (hypertension), accelerated  - Continue metoprolol. - added hydralazine scheduled and as needed  - stop Losartan for now until renal function stabilizes     Hypokalemia - supplement and repeat BMP in AM   Edema - improving LE edema  - 2-D echo done 03/05/15: EF 55-60 percent with no regional wall motion abnormalities.  - monitor    Hiatal hernia - Continue PPI.   Lobar pneumonia, unspecified organism/HCAP (healthcare-associated pneumonia) - Mildly symptomatic. Was on empiric vancomycin and Zosyn, changed to oral ABX Augmentin 2/3 - sputum culture negative to date  - Mucinex and antitussives ordered as needed.    Colonic mass - Will need surgical evaluation pot discharge    Anemia due to chronic blood loss - transfuse for Hg < 7, Hg is up this AM from 7.1 --> 8.2 --> 8.4 - CBC In AM   Multiple fractures of ribs, bilateral, initial encounter for closed fracture - Secondary to frequent falls, likely from polypharmacy/prescription drug abuse.  - Physical therapy evaluation requested. HH PT recommended    History of depression, anxiety and prescription drug abuse - Continue Celexa. - Continue low-dose Ativan twice a day when necessary. - stable for now   DVT prophylaxis - SCDs ordered, possible to resume AC prior to discharge if Hg stable   Code Status: Full.  Family Communication:  plan of care discussed with the patient Disposition Plan: Home when abscess cleared, possibly by 2/6  IV access:  Peripheral IV  Procedures  and diagnostic studies:     Ct Abdomen Pelvis W Contrast 05/01/2015 Slight enlargement of an ascending colon mass. Per report, patient underwent colonoscopy on 04/26/2015. 2. New abscess in the low left anatomic pelvis. Adjacent abscess in the low left iliac fossa is smaller. These collections may be communicative. 3. Large right and small to moderate left pleural effusions. 4. New collapse/consolidation in the right middle lobe, possibly due to  pneumonia. Attention on followup exams is warranted. 5. Several bilateral rib fractures, some of which appear new/healing. 6. Small left renal stone. 7. Moderate to large hiatal hernia.   Ct Image Guided Drainage By Percutaneous Catheter 05/03/2015 Successful left 12 French trans gluteal pelvic abscess drain.   Medical Consultants:  IR  Other Consultants:  PT  IAnti-Infectives:   Vancomycin 1/31 --> 2/3 Zosyn 1/31 --> 2/3 Augmentin 2/3 -->  Faye Ramsay, MD  [facility] Pager [phone]  If 7PM-7AM, please contact night-coverage [email or web address] Password [email or web address] 05/06/2015, 2:40 PM   LOS: 4 days   HPI/Subjective: No events overnight.   Objective: Filed Vitals:   05/05/15 2033 05/06/15 0518 05/06/15 0808 05/06/15 1415  BP: 172/63 185/89 155/61 187/72  Pulse: 82 84  65  Temp: 98.1 F (36.7 C) 98.3 F (36.8 C)  97.8 F (36.6 C)  TempSrc: Oral Oral  Oral  Resp: 20 20  20   Height:      Weight:      SpO2: 97% 96%  100%    Intake/Output Summary (Last 24 hours) at 05/06/15 1440 Last data filed at 05/06/15 1416  Gross per 24 hour  Intake    360 ml  Output    945 ml  Net   -585 ml    Exam:   General:  Pt is alert, follows commands appropriately, not in acute distress  Cardiovascular: Regular rate and rhythm, no rubs, no gallops  Respiratory: Clear to auscultation bilaterally, no wheezing, no crackles, no rhonchi  Abdomen: Soft, non tender, non distended, bowel sounds present, colostomy bag in place   Data Reviewed: Basic Metabolic Panel:  Recent Labs Lab 05/02/15 1237 05/04/15 0510 05/05/15 0538 05/06/15 0555  NA 140 139 140 137  K 3.5 3.8 3.5 3.4*  CL 107 110 109 108  CO2 22 22 23  20*  GLUCOSE 86 79 98 96  BUN 14 11 12 11   CREATININE 1.30* 1.22* 1.09* 1.40*  CALCIUM 8.2* 8.0* 8.5* 8.2*   Liver Function Tests:  Recent Labs Lab 05/02/15 1237  AST 12*  ALT 8*  ALKPHOS 90  BILITOT 0.4  PROT 5.8*  ALBUMIN 2.4*   CBC:  Recent Labs Lab  05/02/15 1237 05/04/15 0510 05/05/15 0538 05/06/15 0555  WBC 9.0 8.7 8.3 10.2  NEUTROABS 6.2  --   --   --   HGB 7.8* 7.1* 8.2* 8.4*  HCT 25.4* 23.8* 27.2* 28.0*  MCV 79.1 78.8 80.2 79.8  PLT 263 228 267 284   Recent Results (from the past 240 hour(s))  Culture, blood (routine x 2) Call MD if unable to obtain prior to antibiotics being given     Status: None (Preliminary result)   Collection Time: 05/02/15 12:30 PM  Result Value Ref Range Status   Specimen Description BLOOD RIGHT ARM  Final   Special Requests BOTTLES DRAWN AEROBIC AND ANAEROBIC 6CC  Final   Culture   Final    NO GROWTH 4 DAYS Performed at [facility]    Report Status PENDING  Incomplete  Culture, blood (routine x 2) Call  MD if unable to obtain prior to antibiotics being given     Status: None (Preliminary result)   Collection Time: 05/02/15 12:30 PM  Result Value Ref Range Status   Specimen Description BLOOD LEFT ARM  Final   Special Requests BOTTLES DRAWN AEROBIC AND ANAEROBIC 6CC  Final   Culture   Final    NO GROWTH 4 DAYS Performed at [facility]    Report Status PENDING  Incomplete  Culture, routine-abscess     Status: None   Collection Time: 05/03/15 11:54 AM  Result Value Ref Range Status   Specimen Description ABSCESS LT TRANSQLUTEAL  Final   Special Requests NONE  Final   Gram Stain   Final    MODERATE WBC PRESENT,BOTH PMN AND MONONUCLEAR NO SQUAMOUS EPITHELIAL CELLS SEEN ABUNDANT GRAM POSITIVE COCCI IN PAIRS ABUNDANT GRAM NEGATIVE RODS Performed at [facility]    Culture   Final    MULTIPLE ORGANISMS PRESENT, NONE PREDOMINANT Note: NO STAPHYLOCOCCUS AUREUS ISOLATED NO GROUP A STREP (S.PYOGENES) ISOLATED Performed at [facility]    Report Status 05/05/2015 FINAL  Final     Scheduled Meds: . amoxicillin-clavulanate  1 tablet Oral Q12H  . citalopram  40 mg Oral QHS  . ferrous sulfate  325 mg Oral TID WC  . guaiFENesin  600 mg Oral BID  . hydrALAZINE   10 mg Oral 3 times per day  . losartan  100 mg Oral Daily  . metoprolol tartrate  12.5 mg Oral BID  . multivitamin  1 tablet Oral Daily  . pantoprazole  40 mg Oral Daily  . polyethylene glycol  17 g Oral BID  . rosuvastatin  10 mg Oral QHS  . senna-docusate  1 tablet Oral BID  . sodium chloride flush  3 mL Intravenous Q12H  . traZODone  100 mg Oral QHS  . Vitamin D (Ergocalciferol)  50,000 Units Oral Q7 days  . zolpidem  5 mg Oral QHS   Continuous Infusions:

## 2015-05-07 ENCOUNTER — Inpatient Hospital Stay (HOSPITAL_COMMUNITY): Payer: Medicare Other

## 2015-05-07 LAB — CBC
HEMATOCRIT: 28 % — AB (ref 36.0–46.0)
HEMOGLOBIN: 8.4 g/dL — AB (ref 12.0–15.0)
MCH: 24.4 pg — ABNORMAL LOW (ref 26.0–34.0)
MCHC: 30 g/dL (ref 30.0–36.0)
MCV: 81.4 fL (ref 78.0–100.0)
Platelets: 276 10*3/uL (ref 150–400)
RBC: 3.44 MIL/uL — ABNORMAL LOW (ref 3.87–5.11)
RDW: 18.6 % — ABNORMAL HIGH (ref 11.5–15.5)
WBC: 8.8 10*3/uL (ref 4.0–10.5)

## 2015-05-07 LAB — BASIC METABOLIC PANEL
ANION GAP: 7 (ref 5–15)
BUN: 10 mg/dL (ref 6–20)
CHLORIDE: 110 mmol/L (ref 101–111)
CO2: 23 mmol/L (ref 22–32)
Calcium: 8.3 mg/dL — ABNORMAL LOW (ref 8.9–10.3)
Creatinine, Ser: 1.3 mg/dL — ABNORMAL HIGH (ref 0.44–1.00)
GFR calc Af Amer: 44 mL/min — ABNORMAL LOW (ref >60)
GFR calc non Af Amer: 38 mL/min — ABNORMAL LOW (ref >60)
GLUCOSE: 103 mg/dL — AB (ref 65–99)
POTASSIUM: 4 mmol/L (ref 3.5–5.1)
Sodium: 140 mmol/L (ref 135–145)

## 2015-05-07 LAB — CULTURE, BLOOD (ROUTINE X 2)
CULTURE: NO GROWTH
Culture: NO GROWTH

## 2015-05-07 MED ORDER — IPRATROPIUM-ALBUTEROL 0.5-2.5 (3) MG/3ML IN SOLN
3.0000 mL | Freq: Three times a day (TID) | RESPIRATORY_TRACT | Status: DC
  Administered 2015-05-07: 3 mL via RESPIRATORY_TRACT
  Filled 2015-05-07: qty 3

## 2015-05-07 MED ORDER — IPRATROPIUM-ALBUTEROL 0.5-2.5 (3) MG/3ML IN SOLN
3.0000 mL | Freq: Two times a day (BID) | RESPIRATORY_TRACT | Status: DC
  Administered 2015-05-08: 3 mL via RESPIRATORY_TRACT
  Filled 2015-05-07: qty 3

## 2015-05-07 NOTE — Progress Notes (Addendum)
Patient ID: [name], female   DOB: [birth year], 81 y.o.   MRN: [id number]  [facility] PROGRESS NOTE  Porfirio Oar Lorenson [id number] DOB: 1934-08-16 DOA: 05/02/2015 PCP: Terald Sleeper, PA-C   Brief narrative:    81 y.o. female with recent perforated sigmoid diverticulosis complicated by intra-abdominal fluid collections status post Hartman's procedure with end colostomy. She had a colonoscopy last week which revealed a large ascending colon mass with biopsy + tubular adenoma which will need to be removed. She also has a history of large hiatal hernia with partial reduction and segmental gastrectomy. She has had persistent anemia. She is on Eliquis chronically for a history of atrial fibrillation, but has not taken it the day of the admission upon the advice of Dr. Hilarie Fredrickson. She was seen by Dr. Hilarie Fredrickson for GI follow-up, who ordered a CT scan one day prior, which showed right sided pneumonia and a pelvic abscess. She was referred as a direct admission from his office.  Assessment/Plan:    Principal Problem:  Pelvic abscess in female likely related to Hartman's procedure, post op complication  - s/p drain, per IR, post op day #4 - was on empiric Vanc and Zosyn for 4 days, transitioned to oral Augmentin 2/3 - continue same ABX - Will need Repeat CT when output 10 cc/24 hrs  Active Problems:  Stage III chronic kidney disease - monitor renal function, Cr is trending up in the past 24 hours - not clear if related to pre renal etiology vs vancomycin - stopped Losartan 2/4, renal function improving  - BMP in AM   Coronary artery disease with history of PTCA - Continue beta blocker and statin. No aspirin in the setting of potential GI bleeding.   PAF - Anticoagulation was on hold secondary to drop in Hg and high risk fall - Continue metoprolol for rate control. - pt was determined to be high risk bleed and there was no plan to resume AC at this time - focus on rate control     HYPERCHOLESTEROLEMIA - Continue Crestor.   HTN (hypertension), accelerated  - Continue metoprolol. - added hydralazine scheduled and as needed  - stop Losartan for now until renal function stabilizes     Hypokalemia - supplemented and WNL this AM - BMP In AM   Edema - improving LE edema  - 2-D echo done 03/05/15: EF 55-60 percent with no regional wall motion abnormalities.  - monitor    Hiatal hernia - Continue PPI.   Lobar pneumonia, unspecified organism/HCAP (healthcare-associated pneumonia) - Mildly symptomatic. Was on empiric vancomycin and Zosyn, changed to oral ABX Augmentin 2/3 - sputum culture negative to date  - Mucinex and antitussives ordered as needed.  - more wheezing on exam this AM 2/5, repeat CXR for follow up and add BD's scheduled and as needed    Colonic mass - Will need surgical evaluation pot discharge    Anemia due to chronic blood loss - transfuse for Hg < 7, Hg is up this AM from 7.1 --> 8.2 --> 8.4 - CBC In AM   Multiple fractures of ribs, bilateral, initial encounter for closed fracture - Secondary to frequent falls, likely from polypharmacy/prescription drug abuse.  - Physical therapy evaluation requested. HH PT recommended    History of depression, anxiety and prescription drug abuse - Continue Celexa. - Continue low-dose Ativan twice a day when necessary. - stable for now   DVT prophylaxis - SCD's   Code Status: Full.  Family Communication:  plan of care discussed with the patient Disposition Plan: Home when abscess cleared, possibly by 2/6  IV access:  Peripheral IV  Procedures and diagnostic studies:     Ct Abdomen Pelvis W Contrast 05/01/2015 Slight enlargement of an ascending colon mass. Per report, patient underwent colonoscopy on 04/26/2015. 2. New abscess in the low left anatomic pelvis. Adjacent abscess in the low left iliac fossa is smaller. These collections may be communicative. 3. Large right and small to  moderate left pleural effusions. 4. New collapse/consolidation in the right middle lobe, possibly due to pneumonia. Attention on followup exams is warranted. 5. Several bilateral rib fractures, some of which appear new/healing. 6. Small left renal stone. 7. Moderate to large hiatal hernia.   Ct Image Guided Drainage By Percutaneous Catheter 05/03/2015 Successful left 12 French trans gluteal pelvic abscess drain.   Medical Consultants:  IR  Other Consultants:  PT  IAnti-Infectives:   Vancomycin 1/31 --> 2/3 Zosyn 1/31 --> 2/3 Augmentin 2/3 -->  Faye Ramsay, MD  [facility] Pager [phone]  If 7PM-7AM, please contact night-coverage [email or web address] Password [email or web address] 05/07/2015, 1:29 PM   LOS: 5 days   HPI/Subjective: No events overnight.   Objective: Filed Vitals:   05/06/15 2057 05/06/15 2303 05/07/15 0535 05/07/15 1308  BP: 187/73 174/64 157/77 153/66  Pulse: 78  73 89  Temp: 97.5 F (36.4 C)  98.1 F (36.7 C) 98.1 F (36.7 C)  TempSrc: Oral  Oral Oral  Resp: 18  18 20   Height:      Weight:      SpO2: 94%  95% 98%    Intake/Output Summary (Last 24 hours) at 05/07/15 1329 Last data filed at 05/07/15 0908  Gross per 24 hour  Intake      0 ml  Output   1560 ml  Net  -1560 ml    Exam:   General:  Pt is alert, follows commands appropriately, not in acute distress  Cardiovascular: Regular rate and rhythm, no rubs, no gallops  Respiratory: mild exp wheezing, diminished breath sounds at bases   Abdomen: Soft, non tender, non distended, bowel sounds present, colostomy bag in place   Data Reviewed: Basic Metabolic Panel:  Recent Labs Lab 05/02/15 1237 05/04/15 0510 05/05/15 0538 05/06/15 0555 05/07/15 0601  NA 140 139 140 137 140  K 3.5 3.8 3.5 3.4* 4.0  CL 107 110 109 108 110  CO2 22 22 23  20* 23  GLUCOSE 86 79 98 96 103*  BUN 14 11 12 11 10   CREATININE 1.30* 1.22* 1.09* 1.40* 1.30*  CALCIUM 8.2* 8.0* 8.5* 8.2* 8.3*   Liver Function Tests:  Recent  Labs Lab 05/02/15 1237  AST 12*  ALT 8*  ALKPHOS 90  BILITOT 0.4  PROT 5.8*  ALBUMIN 2.4*   CBC:  Recent Labs Lab 05/02/15 1237 05/04/15 0510 05/05/15 0538 05/06/15 0555 05/07/15 0601  WBC 9.0 8.7 8.3 10.2 8.8  NEUTROABS 6.2  --   --   --   --   HGB 7.8* 7.1* 8.2* 8.4* 8.4*  HCT 25.4* 23.8* 27.2* 28.0* 28.0*  MCV 79.1 78.8 80.2 79.8 81.4  PLT 263 228 267 284 276   Recent Results (from the past 240 hour(s))  Culture, blood (routine x 2) Call MD if unable to obtain prior to antibiotics being given     Status: None (Preliminary result)   Collection Time: 05/02/15 12:30 PM  Result Value Ref Range Status   Specimen Description BLOOD RIGHT ARM  Final  Special Requests BOTTLES DRAWN AEROBIC AND ANAEROBIC 6CC  Final   Culture   Final    NO GROWTH 4 DAYS Performed at [facility]    Report Status PENDING  Incomplete  Culture, blood (routine x 2) Call MD if unable to obtain prior to antibiotics being given     Status: None (Preliminary result)   Collection Time: 05/02/15 12:30 PM  Result Value Ref Range Status   Specimen Description BLOOD LEFT ARM  Final   Special Requests BOTTLES DRAWN AEROBIC AND ANAEROBIC 6CC  Final   Culture   Final    NO GROWTH 4 DAYS Performed at [facility]    Report Status PENDING  Incomplete  Culture, routine-abscess     Status: None   Collection Time: 05/03/15 11:54 AM  Result Value Ref Range Status   Specimen Description ABSCESS LT TRANSQLUTEAL  Final   Special Requests NONE  Final   Gram Stain   Final    MODERATE WBC PRESENT,BOTH PMN AND MONONUCLEAR NO SQUAMOUS EPITHELIAL CELLS SEEN ABUNDANT GRAM POSITIVE COCCI IN PAIRS ABUNDANT GRAM NEGATIVE RODS Performed at [facility]    Culture   Final    MULTIPLE ORGANISMS PRESENT, NONE PREDOMINANT Note: NO STAPHYLOCOCCUS AUREUS ISOLATED NO GROUP A STREP (S.PYOGENES) ISOLATED Performed at [facility]    Report Status 05/05/2015 FINAL  Final     Scheduled  Meds: . amoxicillin-clavulanate  1 tablet Oral Q12H  . citalopram  40 mg Oral QHS  . ferrous sulfate  325 mg Oral TID WC  . guaiFENesin  600 mg Oral BID  . hydrALAZINE  25 mg Oral 3 times per day  . ipratropium-albuterol  3 mL Nebulization TID  . metoprolol tartrate  12.5 mg Oral BID  . multivitamin  1 tablet Oral Daily  . pantoprazole  40 mg Oral Daily  . polyethylene glycol  17 g Oral BID  . rosuvastatin  10 mg Oral QHS  . senna-docusate  1 tablet Oral BID  . sodium chloride flush  3 mL Intravenous Q12H  . traZODone  100 mg Oral QHS  . Vitamin D (Ergocalciferol)  50,000 Units Oral Q7 days  . zolpidem  5 mg Oral QHS   Continuous Infusions:

## 2015-05-08 ENCOUNTER — Other Ambulatory Visit: Payer: Self-pay | Admitting: General Surgery

## 2015-05-08 ENCOUNTER — Other Ambulatory Visit: Payer: Self-pay | Admitting: Radiology

## 2015-05-08 DIAGNOSIS — N739 Female pelvic inflammatory disease, unspecified: Secondary | ICD-10-CM

## 2015-05-08 LAB — CBC
HCT: 26.3 % — ABNORMAL LOW (ref 36.0–46.0)
HEMOGLOBIN: 8.1 g/dL — AB (ref 12.0–15.0)
MCH: 24.8 pg — ABNORMAL LOW (ref 26.0–34.0)
MCHC: 30.8 g/dL (ref 30.0–36.0)
MCV: 80.7 fL (ref 78.0–100.0)
PLATELETS: 238 10*3/uL (ref 150–400)
RBC: 3.26 MIL/uL — AB (ref 3.87–5.11)
RDW: 18.4 % — ABNORMAL HIGH (ref 11.5–15.5)
WBC: 7.6 10*3/uL (ref 4.0–10.5)

## 2015-05-08 LAB — BASIC METABOLIC PANEL
ANION GAP: 6 (ref 5–15)
BUN: 9 mg/dL (ref 6–20)
CALCIUM: 8.5 mg/dL — AB (ref 8.9–10.3)
CO2: 23 mmol/L (ref 22–32)
CREATININE: 1.13 mg/dL — AB (ref 0.44–1.00)
Chloride: 109 mmol/L (ref 101–111)
GFR, EST AFRICAN AMERICAN: 53 mL/min — AB (ref >60)
GFR, EST NON AFRICAN AMERICAN: 45 mL/min — AB (ref >60)
Glucose, Bld: 83 mg/dL (ref 65–99)
Potassium: 3.7 mmol/L (ref 3.5–5.1)
SODIUM: 138 mmol/L (ref 135–145)

## 2015-05-08 MED ORDER — ALPRAZOLAM 0.25 MG PO TABS: 0.2500 mg | ORAL_TABLET | Freq: Two times a day (BID) | ORAL | Status: DC | PRN

## 2015-05-08 MED ORDER — AMOXICILLIN-POT CLAVULANATE 875-125 MG PO TABS: 1.0000 | ORAL_TABLET | Freq: Two times a day (BID) | ORAL | Status: DC

## 2015-05-08 MED ORDER — METOPROLOL TARTRATE 25 MG PO TABS
25.0000 mg | ORAL_TABLET | ORAL | Status: AC
  Administered 2015-05-08: 25 mg via ORAL
  Filled 2015-05-08: qty 1

## 2015-05-08 MED ORDER — HYDRALAZINE HCL 25 MG PO TABS: 25.0000 mg | ORAL_TABLET | Freq: Three times a day (TID) | ORAL | Status: DC

## 2015-05-08 MED ORDER — OXYCODONE-ACETAMINOPHEN 5-325 MG PO TABS: 1.0000 | ORAL_TABLET | Freq: Four times a day (QID) | ORAL | Status: DC | PRN

## 2015-05-08 MED ORDER — GUAIFENESIN ER 600 MG PO TB12: 600.0000 mg | ORAL_TABLET | Freq: Two times a day (BID) | ORAL | Status: DC

## 2015-05-08 NOTE — Discharge Instructions (Signed)

## 2015-05-08 NOTE — Care Management Note (Signed)
[facility] Management Note  Patient Details  Name: [name] MRN: [id number] Date of Birth: [birth year]  Subjective/Objective:    [facility] rep Santiago Glad aware of d/c & HHPT order.3N1 has already been delivered days ago.                Action/Plan:d/c home w/HHC   Expected Discharge Date:   (unknown)               Expected Discharge Plan:  [place]  In-House Referral:     Discharge planning Services  CM Consult  Post Acute Care Choice:    Choice offered to:  Patient  DME Arranged:  3-N-1 DME Agency:  [place]:  PT [place]:  [place]  Status of Service:  Completed, signed off  Medicare Important Message Given:  Yes Date Medicare IM Given:    Medicare IM give by:    Date Additional Medicare IM Given:    Additional Medicare Important Message give by:     If discussed at [place] of Stay Meetings, dates discussed:    Additional Comments:  Dessa Phi, RN 05/08/2015, 11:35 AM

## 2015-05-08 NOTE — Discharge Summary (Signed)
Physician Discharge Summary  [name] [id number] DOB: October 23, 1934 DOA: 05/02/2015  PCP: Terald Sleeper, PA-C  Admit date: 05/02/2015 Discharge date: 05/08/2015  Recommendations for Outpatient Follow-up:  1. Pt will need to follow up with PCP in 1-2 weeks post discharge 2. Please obtain BMP to evaluate electrolytes and kidney function 3. Please also check CBC to evaluate Hg and Hct levels 4. Pt was on empiric Vanc and Zosyn for 4 days, transitioned to oral Augmentin 2/3 adn will continue Augmentin upon discharge to complete therapy, this ABX should be adequate in treatment of pelvic abscess and PNA 5. Will need Repeat CT when output 10 cc/24 hrs  Discharge Diagnoses:  Principal Problem:   Pelvic abscess in female Active Problems:   Coronary artery disease   CKD (chronic kidney disease) stage 3, GFR 30-59 ml/min   Anemia due to chronic blood loss   Lobar pneumonia, unspecified organism ([place]) / HCAP   PAF (paroxysmal atrial fibrillation) (HCC)   HYPERCHOLESTEROLEMIA   HTN (hypertension)   History of percutaneous coronary intervention   Edema   Colonic mass   Multiple fractures of ribs, bilateral, initial encounter for closed fracture   Anxiety and depression  Discharge Condition: Stable  Diet recommendation: Heart healthy diet discussed in details    Brief narrative:    81 y.o. female with recent perforated sigmoid diverticulosis complicated by intra-abdominal fluid collections status post Hartman's procedure with end colostomy. She had a colonoscopy last week which revealed a large ascending colon mass with biopsy + tubular adenoma which will need to be removed. She also has a history of large hiatal hernia with partial reduction and segmental gastrectomy. She has had persistent anemia. She is on Eliquis chronically for a history of atrial fibrillation, but has not taken it the day of the admission upon the advice of Dr. Hilarie Fredrickson. She was seen by Dr. Hilarie Fredrickson for GI  follow-up, who ordered a CT scan one day prior, which showed right sided pneumonia and a pelvic abscess. She was referred as a direct admission from his office.  Assessment/Plan:    Principal Problem:  Pelvic abscess in female likely related to Hartman's procedure, post op complication  - s/p drain, per IR, post op day #5 - was on empiric Vanc and Zosyn for 4 days, transitioned to oral Augmentin 2/3 - continue same ABX upon discharge  - Will need Repeat CT when output 10 cc/24 hrs  Active Problems:  Stage III chronic kidney disease - monitor renal function, Cr is trending down in the past 24 hours - not clear if related to pre renal etiology vs vancomycin - losartan resumed upon discharged but again, pt needs close monitoring of renal function    Coronary artery disease with history of PTCA - Continue beta blocker and statin.   PAF - Anticoagulation was on hold secondary to drop in Hg and high risk fall - Continue metoprolol for rate control. - pt was determined to be high risk bleed and there was no plan to resume AC at this time - focus on rate control    HYPERCHOLESTEROLEMIA - Continue Crestor.   HTN (hypertension), accelerated  - continue home medical regimen    Hypokalemia - supplemented and WNL this AM   Edema - improving LE edema  - 2-D echo done 03/05/15: EF 55-60 percent with no regional wall motion abnormalities.  - monitor    Hiatal hernia - Continue PPI.   Lobar pneumonia, unspecified organism/HCAP (healthcare-associated pneumonia) - Mildly symptomatic. Was  on empiric vancomycin and Zosyn, changed to oral ABX Augmentin 2/3 - sputum culture negative to date    Colonic mass - Will need surgical evaluation post discharge    Anemia due to chronic blood loss - Hg overall stable    Multiple fractures of ribs, bilateral, initial encounter for closed fracture - Secondary to frequent falls, likely from polypharmacy/prescription drug abuse.   - Physical therapy evaluation requested. HH PT recommended    History of depression, anxiety and prescription drug abuse - Continue Celexa. - Continue low-dose Ativan twice a day when necessary. - stable for now    Code Status: Full.  Family Communication: plan of care discussed with the patient Disposition Plan: Home   IV access:  Peripheral IV  Procedures and diagnostic studies:    Ct Abdomen Pelvis W Contrast 05/01/2015 Slight enlargement of an ascending colon mass. Per report, patient underwent colonoscopy on 04/26/2015. 2. New abscess in the low left anatomic pelvis. Adjacent abscess in the low left iliac fossa is smaller. These collections may be communicative. 3. Large right and small to moderate left pleural effusions. 4. New collapse/consolidation in the right middle lobe, possibly due to pneumonia. Attention on followup exams is warranted. 5. Several bilateral rib fractures, some of which appear new/healing. 6. Small left renal stone. 7. Moderate to large hiatal hernia.   Ct Image Guided Drainage By Percutaneous Catheter 05/03/2015 Successful left 12 French trans gluteal pelvic abscess drain.   Medical Consultants:  IR  Other Consultants:  PT  IAnti-Infectives:   Vancomycin 1/31 --> 2/3 Zosyn 1/31 --> 2/3 Augmentin 2/3 -->      Discharge Exam: Filed Vitals:   05/08/15 0702 05/08/15 0736  BP: 202/77 182/67  Pulse:  70  Temp:    Resp:     Filed Vitals:   05/08/15 0636 05/08/15 0702 05/08/15 0736 05/08/15 0746  BP: 181/63 202/77 182/67   Pulse:   70   Temp:      TempSrc:      Resp:      Height:      Weight:      SpO2:    94%    General: Pt is alert, follows commands appropriately, not in acute distress Cardiovascular: Regular rate and rhythm, no rubs, no gallops Respiratory: Clear to auscultation bilaterally, no wheezing, no crackles, no rhonchi Abdominal: Soft, non tender, non distended, bowel sounds +, no guarding   Discharge  Instructions  Discharge Instructions    Diet - low sodium heart healthy    Complete by:  As directed      Increase activity slowly    Complete by:  As directed             Medication List    STOP taking these medications        feeding supplement (ENSURE ENLIVE) Liqd      TAKE these medications        ALPRAZolam 0.25 MG tablet  Commonly known as:  XANAX  Take 1 tablet (0.25 mg total) by mouth 2 (two) times daily as needed for anxiety.     amoxicillin-clavulanate 875-125 MG tablet  Commonly known as:  AUGMENTIN  Take 1 tablet by mouth every 12 (twelve) hours.     citalopram 40 MG tablet  Commonly known as:  CELEXA  Take 0.5 tablets (20 mg total) by mouth daily.     esomeprazole 40 MG capsule  Commonly known as:  NEXIUM  Take 40 mg by mouth daily before breakfast.  ferrous sulfate 325 (65 FE) MG EC tablet  Take 325 mg by mouth 3 (three) times daily.     guaiFENesin 600 MG 12 hr tablet  Commonly known as:  MUCINEX  Take 1 tablet (600 mg total) by mouth 2 (two) times daily.     hydrALAZINE 25 MG tablet  Commonly known as:  APRESOLINE  Take 1 tablet (25 mg total) by mouth every 8 (eight) hours.     losartan 100 MG tablet  Commonly known as:  COZAAR  Take 1 tablet (100 mg total) by mouth daily.     metoprolol tartrate 25 MG tablet  Commonly known as:  LOPRESSOR  Take 1 tablet (25 mg total) by mouth 2 (two) times daily.     nitroGLYCERIN 0.4 MG SL tablet  Commonly known as:  NITROSTAT  Place 0.4 mg under the tongue every 5 (five) minutes as needed for chest pain. Reported on 04/26/2015     ondansetron 4 MG disintegrating tablet  Commonly known as:  ZOFRAN ODT  4mg  ODT q4 hours prn nausea/vomit     oxyCODONE-acetaminophen 5-325 MG tablet  Commonly known as:  PERCOCET/ROXICET  Take 1 tablet by mouth every 6 (six) hours as needed.     PRESERVISION AREDS Tabs  Take 1 tablet by mouth 2 (two) times daily.     PROAIR HFA 108 (90 Base) MCG/ACT inhaler   Generic drug:  albuterol  Inhale 2 puffs into the lungs every 4 (four) hours.     rosuvastatin 10 MG tablet  Commonly known as:  CRESTOR  Take 10 mg by mouth at bedtime.     SYSTANE BALANCE OP  Place 1-2 drops into both eyes daily as needed (dry eyes). Reported on 04/26/2015     traZODone 100 MG tablet  Commonly known as:  DESYREL  Take 100 mg by mouth at bedtime.     Vitamin D (Ergocalciferol) 50000 units Caps capsule  Commonly known as:  DRISDOL  Take 50,000 Units by mouth every Tuesday.           Follow-up Information    Follow up with Terald Sleeper, PA-C.   Specialty:  General Practice   Contact information:   [address] [place] [address] [phone]        The results of significant diagnostics from this hospitalization (including imaging, microbiology, ancillary and laboratory) are listed below for reference.     Microbiology: Recent Results (from the past 240 hour(s))  Culture, blood (routine x 2) Call MD if unable to obtain prior to antibiotics being given     Status: None   Collection Time: 05/02/15 12:30 PM  Result Value Ref Range Status   Specimen Description BLOOD RIGHT ARM  Final   Special Requests BOTTLES DRAWN AEROBIC AND ANAEROBIC [place]  Final   Culture   Final    NO GROWTH 5 DAYS Performed at [facility]    Report Status 05/07/2015 FINAL  Final  Culture, blood (routine x 2) Call MD if unable to obtain prior to antibiotics being given     Status: None   Collection Time: 05/02/15 12:30 PM  Result Value Ref Range Status   Specimen Description BLOOD LEFT ARM  Final   Special Requests BOTTLES DRAWN AEROBIC AND ANAEROBIC [place]  Final   Culture   Final    NO GROWTH 5 DAYS Performed at [facility]    Report Status 05/07/2015 FINAL  Final  Culture, routine-abscess  Status: None   Collection Time: 05/03/15 11:54 AM  Result Value Ref Range Status   Specimen Description ABSCESS LT TRANSQLUTEAL  Final   Special Requests NONE   Final   Gram Stain   Final    MODERATE WBC PRESENT,BOTH PMN AND MONONUCLEAR NO SQUAMOUS EPITHELIAL CELLS SEEN ABUNDANT GRAM POSITIVE COCCI IN PAIRS ABUNDANT GRAM NEGATIVE RODS Performed at [facility]    Culture   Final    MULTIPLE ORGANISMS PRESENT, NONE PREDOMINANT Note: NO STAPHYLOCOCCUS AUREUS ISOLATED NO GROUP A STREP (S.PYOGENES) ISOLATED Performed at [facility]    Report Status 05/05/2015 FINAL  Final     Labs: Basic Metabolic Panel:  Recent Labs Lab 05/04/15 0510 05/05/15 0538 05/06/15 0555 05/07/15 0601 05/08/15 0631  NA 139 140 137 140 138  K 3.8 3.5 3.4* 4.0 3.7  CL 110 109 108 110 109  CO2 22 23 20* 23 23  GLUCOSE 79 98 96 103* 83  BUN 11 12 11 10 9   CREATININE 1.22* 1.09* 1.40* 1.30* 1.13*  CALCIUM 8.0* 8.5* 8.2* 8.3* 8.5*   Liver Function Tests:  Recent Labs Lab 05/02/15 1237  AST 12*  ALT 8*  ALKPHOS 90  BILITOT 0.4  PROT 5.8*  ALBUMIN 2.4*   CBC:  Recent Labs Lab 05/02/15 1237 05/04/15 0510 05/05/15 0538 05/06/15 0555 05/07/15 0601 05/08/15 0631  WBC 9.0 8.7 8.3 10.2 8.8 7.6  NEUTROABS 6.2  --   --   --   --   --   HGB 7.8* 7.1* 8.2* 8.4* 8.4* 8.1*  HCT 25.4* 23.8* 27.2* 28.0* 28.0* 26.3*  MCV 79.1 78.8 80.2 79.8 81.4 80.7  PLT 263 228 267 284 276 238   SIGNED: Time coordinating discharge: 30 minutes  Heather Eckford, MD  [facility] 05/08/2015, 10:49 AM Pager [phone]  If 7PM-7AM, please contact night-coverage [email or web address] Password [email or web address]

## 2015-05-18 ENCOUNTER — Other Ambulatory Visit: Payer: Self-pay | Admitting: Radiology

## 2015-05-18 ENCOUNTER — Other Ambulatory Visit: Payer: Self-pay | Admitting: General Surgery

## 2015-05-18 ENCOUNTER — Ambulatory Visit
Admission: RE | Admit: 2015-05-18 | Discharge: 2015-05-18 | Disposition: A | Discharge status: Home / Self Care | Payer: Medicare Other | Source: Ambulatory Visit | Attending: General Surgery | Admitting: General Surgery

## 2015-05-18 ENCOUNTER — Ambulatory Visit
Admission: RE | Admit: 2015-05-18 | Discharge: 2015-05-18 | Disposition: A | Discharge status: Home / Self Care | Payer: Medicare Other | Source: Ambulatory Visit | Attending: Radiology | Admitting: Radiology

## 2015-05-18 DIAGNOSIS — N739 Female pelvic inflammatory disease, unspecified: Secondary | ICD-10-CM

## 2015-05-18 MED ORDER — IOPAMIDOL (ISOVUE-300) INJECTION 61%
100.0000 mL | Freq: Once | INTRAVENOUS | Status: AC | PRN
  Administered 2015-05-18: 100 mL via INTRAVENOUS

## 2015-05-18 NOTE — Progress Notes (Signed)
Chief Complaint: Patient was seen in consultation today for follow up of pelvic abscess at the request of Dr. Aviva Signs  Referring Physician(s): Dr. Aviva Signs  History of Present Illness: [name] is a 81 y.o. female who underwent partial left colectomy with Hartman's procedure in Dec 2016 for perforated colon. She has had follow up CT scans and a pelvic abscess was noted on 05/01/2015 scan. She subsequently underwent perc drain placement on 05/03/15. She reports continued cloudy drainage in her drainage bulb. She reports the drain is not flushed daily, but no more than every other day. She denies abd pain, N/V. She is tolerating diet, but reports her appetite could be better. She had a follow up CT this morning and is here for drain injection and follow up.  Past Medical History  Diagnosis Date  . Depressive disorder, not elsewhere classified   . Anxiety state, unspecified   . Pure hypercholesterolemia   . Esophageal reflux   . Infection of esophagostomy ([place])   . Congestive heart failure, unspecified     diastolic heart failure  . Pulmonary hypertension (HCC)     PA systolic pressure [id number] mmHg by echocardiogram, PA pressure 33/10 by cardiac catheterization PA saturation 62% thermodilution cardiac index 2.0 thick cardiac index 2.4  . Bradycardia     previously with coreg  . Tricuspid regurgitation     moderate tricuspid regurgitation by echocardiogram, prior use of anorexic agents  . Melanoma ([place]) 1995    removal on back   . Coronary artery disease     a. Moderate to severe coronary disease involving the left anterior descending artery and right coronary artery.  Sequential stenosis in the LAD is significant.  Right coronary artery is moderate to severely likely nonischemic. Questionable small LVOT obstruction.  No Brockenbrough maneuver was performed.  Catheterization January 2013; b. 05/2014 MV no isch/infarct, EF 60%.  Marland Kitchen PONV (postoperative nausea and vomiting)     . Hypertension   . H/O hiatal hernia   . Gastritis   . [name]'s esophagus   . IBS (irritable bowel syndrome)   . Macular degeneration   . Dysrhythmia   . Diverticulitis   . Perforated bowel ([place])   . Anemia     post operative  . Atrial fibrillation with RVR ([place]) 09/06/2012  . Perforated sigmoid colon ([place]) 03/03/2015  . Cellulitis of extremity 09/06/2012    Past Surgical History  Procedure Laterality Date  . Nissen fundoplication  [id number]  . Rotator cuff repair  ~ 2001    right  . Coronary angioplasty with stent placement  05/08/11    "1"  . Tonsillectomy  ~ 1944  . Shoulder arthroscopy  ~ 2004; 2005    left; "joint's wore out"  . Cataract extraction w/ intraocular lens  implant, bilateral  ~ 2002  . Tubal ligation  1972  . Percutaneous coronary stent intervention (pci-s) N/A 05/08/2011    Procedure: PERCUTANEOUS CORONARY STENT INTERVENTION (PCI-S);  Surgeon: Wellington Hampshire, MD;  Location: [facility] CATH [facility];  Service: Cardiovascular;  Laterality: N/A;  . Esophageal manometry N/A 06/06/2014    Procedure: ESOPHAGEAL MANOMETRY (EM);  Surgeon: Jerene Bears, MD;  Location: [facility] END[facility];  Service: Gastroenterology;  Laterality: N/A;  . Laparoscopic nissen fundoplication N/A [id number]    Procedure: LAPAROSCOPIC  LYSIS OF ADHESIONS, SEGMENTAL GASTRECTOMY, PARTIAL REDUCTION OF HERNIA;  Surgeon: [name], MD;  Location: [facility] ORS;  Service: General;  Laterality: N/A;  . Hernia repair    .  Colectomy with colostomy creation/hartmann procedure N/A 03/05/2015    Procedure: PARTIAL COLECTOMY WITH COLOSTOMY CREATION/HARTMANN PROCEDURE;  Surgeon: Aviva Signs, MD;  Location: [facility];  Service: General;  Laterality: N/A;  . Cardiac catheterization Left 03/05/2015    Procedure: CENTRAL LINE INSERTION;  Surgeon: Aviva Signs, MD;  Location: [facility];  Service: General;  Laterality: Left;    Allergies: Sulfamethoxazole  Medications: Prior to Admission medications   Medication Sig Start Date End Date  Taking? Authorizing Provider  albuterol (PROAIR HFA) 108 (90 BASE) MCG/ACT inhaler Inhale 2 puffs into the lungs every 4 (four) hours.     Historical Provider, MD  ALPRAZolam Duanne Moron) 0.25 MG tablet Take 1 tablet (0.25 mg total) by mouth 2 (two) times daily as needed for anxiety. 05/08/15   Theodis Blaze, MD  amoxicillin-clavulanate (AUGMENTIN) 875-125 MG tablet Take 1 tablet by mouth every 12 (twelve) hours. 05/08/15   Theodis Blaze, MD  citalopram (CELEXA) 40 MG tablet Take 0.5 tablets (20 mg total) by mouth daily. Patient taking differently: Take 40 mg by mouth at bedtime.  09/12/12   Nimish Luther Parody, MD  esomeprazole (NEXIUM) 40 MG capsule Take 40 mg by mouth daily before breakfast.     Historical Provider, MD  ferrous sulfate 325 (65 FE) MG EC tablet Take 325 mg by mouth 3 (three) times daily.     Historical Provider, MD  guaiFENesin (MUCINEX) 600 MG 12 hr tablet Take 1 tablet (600 mg total) by mouth 2 (two) times daily. 05/08/15   Theodis Blaze, MD  hydrALAZINE (APRESOLINE) 25 MG tablet Take 1 tablet (25 mg total) by mouth every 8 (eight) hours. 05/08/15   Theodis Blaze, MD  losartan (COZAAR) 100 MG tablet Take 1 tablet (100 mg total) by mouth daily. 11/11/12   Herminio Commons, MD  metoprolol tartrate (LOPRESSOR) 25 MG tablet Take 1 tablet (25 mg total) by mouth 2 (two) times daily. Patient taking differently: Take 12.5 mg by mouth 2 (two) times daily.  03/14/15   Silver Huguenin Elgergawy, MD  Multiple Vitamins-Minerals (PRESERVISION AREDS) TABS Take 1 tablet by mouth 2 (two) times daily.     Historical Provider, MD  nitroGLYCERIN (NITROSTAT) 0.4 MG SL tablet Place 0.4 mg under the tongue every 5 (five) minutes as needed for chest pain. Reported on 04/26/2015    Historical Provider, MD  ondansetron (ZOFRAN ODT) 4 MG disintegrating tablet 4mg  ODT q4 hours prn nausea/vomit 02/10/15   Milton Ferguson, MD  oxyCODONE-acetaminophen (PERCOCET/ROXICET) 5-325 MG tablet Take 1 tablet by mouth every 6 (six) hours as  needed. 05/08/15   Theodis Blaze, MD  Propylene Glycol (SYSTANE BALANCE OP) Place 1-2 drops into both eyes daily as needed (dry eyes). Reported on 04/26/2015    Historical Provider, MD  rosuvastatin (CRESTOR) 10 MG tablet Take 10 mg by mouth at bedtime.     Historical Provider, MD  traZODone (DESYREL) 100 MG tablet Take 100 mg by mouth at bedtime.     Historical Provider, MD  Vitamin D, Ergocalciferol, (DRISDOL) 50000 UNITS CAPS Take 50,000 Units by mouth every Tuesday.     Historical Provider, MD     Family History  Problem Relation Age of Onset  . Heart attack Father   . Hypertension Father   . Diabetes Mother   . Hypertension Mother   . Colon cancer Neg Hx   . Esophageal cancer Neg Hx   . Rectal cancer Neg Hx   . Stomach cancer Neg Hx   .  Skin cancer Brother     melanoma    Social History   Social History  . Marital Status: Divorced    Spouse Name: N/A  . Number of Children: 3  . Years of Education: N/A   Occupational History  . Retired    Social History Main Topics  . Smoking status: Former Smoker -- 1.00 packs/day for 20 years    Types: Cigarettes    Start date: 10/12/1958    Quit date: 04/26/1988  . Smokeless tobacco: Never Used  . Alcohol Use: No  . Drug Use: No  . Sexual Activity: No   Other Topics Concern  . Not on file   Social History Narrative   Divorced.  Lives alone.  Ambulates with a walker.     Review of Systems: A 12 point ROS discussed and pertinent positives are indicated in the HPI above.  All other systems are negative.  Review of Systems  Vital Signs: There were no vitals taken for this visit.  Physical Exam Abd: soft, ND, NT TG drain intact, site clean. Cloudy but thin output in bulb  Imaging:  Ct Image Guided Drainage By Percutaneous Catheter  05/03/2015  CLINICAL DATA:  Pelvic abscess EXAM: CT IMAGE GUIDED DRAINAGE BY PERCUTANEOUS CATHETER FLUOROSCOPY TIME:  none MEDICATIONS AND MEDICAL HISTORY: Versed One mg, Fentanyl 25 mcg.  Additional Medications: . ANESTHESIA/SEDATION: Moderate sedation time: 14 minutes CONTRAST:  Zero PROCEDURE: The procedure, risks, benefits, and alternatives were explained to the patient. Questions regarding the procedure were encouraged and answered. The patient understands and consents to the procedure. The left gluteal was prepped with ChloraPrep in a sterile fashion, and a sterile drape was applied covering the operative field. A sterile gown and sterile gloves were used for the procedure. Under CT guidance, an 18 gauge needle was inserted into the pelvic abscess via left trans gluteal approach. It was removed over an Amplatz wire. A 12 French dilator followed by a 12 [place] straight were inserted. It was looped and string fixed. Frank pus was aspirated. FINDINGS: Images document 68 French left trans gluteal pelvic abscess drain. COMPLICATIONS: Successful left 12 French trans gluteal pelvic abscess drain. Electronically Signed   By: Marybelle Killings M.D.   On: 05/03/2015 16:11    Labs:  CBC:  Recent Labs  05/05/15 0538 05/06/15 0555 05/07/15 0601 05/08/15 0631  WBC 8.3 10.2 8.8 7.6  HGB 8.2* 8.4* 8.4* 8.1*  HCT 27.2* 28.0* 28.0* 26.3*  PLT 267 284 276 238    COAGS:  Recent Labs  09/26/14 0650 03/02/15 2045 03/03/15 0626 05/03/15 0556  INR 0.97 1.07 1.28 1.35    BMP:  Recent Labs  05/05/15 0538 05/06/15 0555 05/07/15 0601 05/08/15 0631  NA 140 137 140 138  K 3.5 3.4* 4.0 3.7  CL 109 108 110 109  CO2 23 20* 23 23  GLUCOSE 98 96 103* 83  BUN 12 11 10 9   CALCIUM 8.5* 8.2* 8.3* 8.5*  CREATININE 1.09* 1.40* 1.30* 1.13*  GFRNONAA 47* 35* 38* 45*  GFRAA 55* 41* 44* 53*    LIVER FUNCTION TESTS:  Recent Labs  03/06/15 0801 03/07/15 0420 03/20/15 0700 05/02/15 1237  BILITOT 0.9 0.5 0.5 0.4  AST 24 22 13* 12*  ALT 19 16 10* 8*  ALKPHOS 65 64 57 90  PROT 5.3* 4.9* 6.0* 5.8*  ALBUMIN 2.3* 2.1* 2.6* 2.4*    Assessment: Pelvic abscess, post recent partial colectomy  with Hartman's procedure. CT reviewed: interval near resolution of pelvic  abscess, though small amount residual fluid noted. Drain injection performed and shows apparent fistulous connection to decompressed colonic/Hartman's stump. Drain needs to remain, but we switched it from bulb suction to gravity drain. Encouraged/recommended daily flushes. Will bring her back in 2 weeks for repeat drain injection, no need for new CT.   Electronically Signed: Ascencion Dike 05/18/2015, 10:44 AM

## 2015-05-29 ENCOUNTER — Telehealth: Payer: Self-pay | Admitting: Internal Medicine

## 2015-05-29 NOTE — Telephone Encounter (Signed)
Appears pt had a pelvic abscess related to Hartmans surgery. Referred pt to her PCP to get the name of surgeon that did procedure.

## 2015-06-01 ENCOUNTER — Ambulatory Visit
Admission: RE | Admit: 2015-06-01 | Discharge: 2015-06-01 | Disposition: A | Discharge status: Home / Self Care | Payer: Medicare Other | Source: Ambulatory Visit | Attending: General Surgery | Admitting: General Surgery

## 2015-06-01 ENCOUNTER — Ambulatory Visit
Admission: RE | Admit: 2015-06-01 | Discharge: 2015-06-01 | Disposition: A | Discharge status: Home / Self Care | Payer: Medicare Other | Source: Ambulatory Visit | Attending: Radiology | Admitting: Radiology

## 2015-06-01 DIAGNOSIS — N739 Female pelvic inflammatory disease, unspecified: Secondary | ICD-10-CM

## 2015-06-01 NOTE — Progress Notes (Signed)
Patient ID: [name], female   DOB: [birth year], 81 y.o.   MRN: [id number]       Chief Complaint: Pelvic abscess drain, outpatient follow-up, decreased output   Referring Physician(s): Allred,Darrell K  History of Present Illness: [name] is a 81 y.o. female with a complex medical history. Prior diverticulitis with perforation of the sigmoid colon. Patient had a partial colectomy with colostomy creation and Hartman's pouch. Left pelvic abscess developed. This was drained percutaneously with image guidance. Initial outpatient follow-up demonstrated resolution of the abscess but a persistent fistula to rectum. She now returns for outpatient reevaluation. No interval fevers. No pelvic pain. Tolerating her diet. She has completed antibiotics. Overall she is improving.  Past Medical History  Diagnosis Date  . Depressive disorder, not elsewhere classified   . Anxiety state, unspecified   . Pure hypercholesterolemia   . Esophageal reflux   . Infection of esophagostomy ([place])   . Congestive heart failure, unspecified     diastolic heart failure  . Pulmonary hypertension (HCC)     PA systolic pressure [id number] mmHg by echocardiogram, PA pressure 33/10 by cardiac catheterization PA saturation 62% thermodilution cardiac index 2.0 thick cardiac index 2.4  . Bradycardia     previously with coreg  . Tricuspid regurgitation     moderate tricuspid regurgitation by echocardiogram, prior use of anorexic agents  . Melanoma ([place]) 1995    removal on back   . Coronary artery disease     a. Moderate to severe coronary disease involving the left anterior descending artery and right coronary artery.  Sequential stenosis in the LAD is significant.  Right coronary artery is moderate to severely likely nonischemic. Questionable small LVOT obstruction.  No Brockenbrough maneuver was performed.  Catheterization January 2013; b. 05/2014 MV no isch/infarct, EF 60%.  Marland Kitchen PONV (postoperative nausea and vomiting)   .  Hypertension   . H/O hiatal hernia   . Gastritis   . [name]'s esophagus   . IBS (irritable bowel syndrome)   . Macular degeneration   . Dysrhythmia   . Diverticulitis   . Perforated bowel ([place])   . Anemia     post operative  . Atrial fibrillation with RVR ([place]) 09/06/2012  . Perforated sigmoid colon ([place]) 03/03/2015  . Cellulitis of extremity 09/06/2012    Past Surgical History  Procedure Laterality Date  . Nissen fundoplication  99991111  . Rotator cuff repair  ~ 2001    right  . Coronary angioplasty with stent placement  05/08/11    "1"  . Tonsillectomy  ~ 1944  . Shoulder arthroscopy  ~ 2004; 2005    left; "joint's wore out"  . Cataract extraction w/ intraocular lens  implant, bilateral  ~ 2002  . Tubal ligation  1972  . Percutaneous coronary stent intervention (pci-s) N/A 05/08/2011    Procedure: PERCUTANEOUS CORONARY STENT INTERVENTION (PCI-S);  Surgeon: Wellington Hampshire, MD;  Location: [facility] CATH LAB;  Service: Cardiovascular;  Laterality: N/A;  . Esophageal manometry N/A 06/06/2014    Procedure: ESOPHAGEAL MANOMETRY (EM);  Surgeon: Jerene Bears, MD;  Location: [facility] ENDOSCOPY;  Service: Gastroenterology;  Laterality: N/A;  . Laparoscopic nissen fundoplication N/A [id number]    Procedure: LAPAROSCOPIC  LYSIS OF ADHESIONS, SEGMENTAL GASTRECTOMY, PARTIAL REDUCTION OF HERNIA;  Surgeon: [name], MD;  Location: [facility] ORS;  Service: General;  Laterality: N/A;  . Hernia repair    . Colectomy with colostomy creation/hartmann procedure N/A 03/05/2015    Procedure: PARTIAL COLECTOMY WITH COLOSTOMY CREATION/HARTMANN  PROCEDURE;  Surgeon: Aviva Signs, MD;  Location: [facility] ORS;  Service: General;  Laterality: N/A;  . Cardiac catheterization Left 03/05/2015    Procedure: CENTRAL LINE INSERTION;  Surgeon: Aviva Signs, MD;  Location: [facility] ORS;  Service: General;  Laterality: Left;    Allergies: Sulfamethoxazole  Medications: Prior to Admission medications   Medication Sig Start Date End Date Taking?  Authorizing Provider  albuterol (PROAIR HFA) 108 (90 BASE) MCG/ACT inhaler Inhale 2 puffs into the lungs every 4 (four) hours.     Historical Provider, MD  ALPRAZolam Duanne Moron) 0.25 MG tablet Take 1 tablet (0.25 mg total) by mouth 2 (two) times daily as needed for anxiety. 05/08/15   Theodis Blaze, MD  amoxicillin-clavulanate (AUGMENTIN) 875-125 MG tablet Take 1 tablet by mouth every 12 (twelve) hours. 05/08/15   Theodis Blaze, MD  citalopram (CELEXA) 40 MG tablet Take 0.5 tablets (20 mg total) by mouth daily. Patient taking differently: Take 40 mg by mouth at bedtime.  09/12/12   Nimish Luther Parody, MD  esomeprazole (NEXIUM) 40 MG capsule Take 40 mg by mouth daily before breakfast.     Historical Provider, MD  ferrous sulfate 325 (65 FE) MG EC tablet Take 325 mg by mouth 3 (three) times daily.     Historical Provider, MD  guaiFENesin (MUCINEX) 600 MG 12 hr tablet Take 1 tablet (600 mg total) by mouth 2 (two) times daily. 05/08/15   Theodis Blaze, MD  hydrALAZINE (APRESOLINE) 25 MG tablet Take 1 tablet (25 mg total) by mouth every 8 (eight) hours. 05/08/15   Theodis Blaze, MD  losartan (COZAAR) 100 MG tablet Take 1 tablet (100 mg total) by mouth daily. 11/11/12   Herminio Commons, MD  metoprolol tartrate (LOPRESSOR) 25 MG tablet Take 1 tablet (25 mg total) by mouth 2 (two) times daily. Patient taking differently: Take 12.5 mg by mouth 2 (two) times daily.  03/14/15   Silver Huguenin Elgergawy, MD  Multiple Vitamins-Minerals (PRESERVISION AREDS) TABS Take 1 tablet by mouth 2 (two) times daily.     Historical Provider, MD  nitroGLYCERIN (NITROSTAT) 0.4 MG SL tablet Place 0.4 mg under the tongue every 5 (five) minutes as needed for chest pain. Reported on 04/26/2015    Historical Provider, MD  ondansetron (ZOFRAN ODT) 4 MG disintegrating tablet 4mg  ODT q4 hours prn nausea/vomit 02/10/15   Milton Ferguson, MD  oxyCODONE-acetaminophen (PERCOCET/ROXICET) 5-325 MG tablet Take 1 tablet by mouth every 6 (six) hours as needed.  05/08/15   Theodis Blaze, MD  Propylene Glycol (SYSTANE BALANCE OP) Place 1-2 drops into both eyes daily as needed (dry eyes). Reported on 04/26/2015    Historical Provider, MD  rosuvastatin (CRESTOR) 10 MG tablet Take 10 mg by mouth at bedtime.     Historical Provider, MD  traZODone (DESYREL) 100 MG tablet Take 100 mg by mouth at bedtime.     Historical Provider, MD  Vitamin D, Ergocalciferol, (DRISDOL) 50000 UNITS CAPS Take 50,000 Units by mouth every Tuesday.     Historical Provider, MD     Family History  Problem Relation Age of Onset  . Heart attack Father   . Hypertension Father   . Diabetes Mother   . Hypertension Mother   . Colon cancer Neg Hx   . Esophageal cancer Neg Hx   . Rectal cancer Neg Hx   . Stomach cancer Neg Hx   . Skin cancer Brother     melanoma    Social History  Social History  . Marital Status: Divorced    Spouse Name: N/A  . Number of Children: 3  . Years of Education: N/A   Occupational History  . Retired    Social History Main Topics  . Smoking status: Former Smoker -- 1.00 packs/day for 20 years    Types: Cigarettes    Start date: 10/12/1958    Quit date: 04/26/1988  . Smokeless tobacco: Never Used  . Alcohol Use: No  . Drug Use: No  . Sexual Activity: No   Other Topics Concern  . Not on file   Social History Narrative   Divorced.  Lives alone.  Ambulates with a walker.      Review of Systems: A 12 point ROS discussed and pertinent positives are indicated in the HPI above.  All other systems are negative.  Review of Systems  Vital Signs: BP 188/84 mmHg  Pulse 63  Temp(Src) 98.1 F (36.7 C)  SpO2 96%  Physical Exam  Constitutional: She appears well-developed and well-nourished. No distress.  Genitourinary:  Pelvic abscess drain site is clean, dry and intact. Minimal serosanguineous drainage in the collection bag.  Skin: Skin is warm and dry. No rash noted. She is not diaphoretic. No erythema.     Imaging: Dg Chest 2  View  05/07/2015  CLINICAL DATA:  81 year old female recently admitted with pelvic abscess. Shortness of breath today. Initial encounter. EXAM: CHEST  2 VIEW COMPARISON:  CT Abdomen and Pelvis 05/01/2015 and earlier. FINDINGS: Moderate to large right and small to moderate left pleural effusions re- demonstrated and may be mildly increased from the recent CT. Stable cardiomegaly and mediastinal contours. Superimposed gastric hiatal hernia. Multiple epigastric surgical clips are stable. No pneumothorax or pulmonary edema. No acute osseous abnormality identified. Calcified aortic atherosclerosis. IMPRESSION: Moderate to large right greater than left pleural effusions may be mildly increased from the recent CT Abdomen and Pelvis. Superimposed cardiomegaly and hiatal hernia. Electronically Signed   By: Genevie Ann M.D.   On: 05/07/2015 13:33   Ct Abdomen Pelvis W Contrast  05/18/2015  CLINICAL DATA:  Followup pelvic drain EXAM: CT ABDOMEN AND PELVIS WITH CONTRAST TECHNIQUE: Multidetector CT imaging of the abdomen and pelvis was performed using the standard protocol following bolus administration of intravenous contrast. CONTRAST:  128mL ISOVUE-300 IOPAMIDOL (ISOVUE-300) INJECTION 61% COMPARISON:  05/01/2015 FINDINGS: Bilateral pleural effusions right greater than left moderate in size are stable. There is associated compressive atelectasis of the bilateral lungs. Stable moderate-sized hiatal hernia. Liver, gallbladder, spleen are within normal limits Pancreas is atrophic.  Adrenal glands are within normal limits Several renal cysts are stable. Small calculus in the lower pole of the left kidney is stable. Mass in the ascending colon in image 43 is less apparent. Left lower quadrant colostomy site is stable. Left trans gluteal pelvic abscess strain has been placed. Abscess has resolved. Atherosclerotic calcifications. Trace free fluid is layering in the pelvis. No abnormal retroperitoneal adenopathy. Posthysterectomy. No  vertebral compression deformity. Degenerative disc disease at L5-S1 with mild anterolisthesis L5 upon S1. IMPRESSION: Left pelvic abscess strain has been placed. Pelvic abscess has resolved. Mass in the ascending colon is again noted but is less apparent. It may have undergone treatment. Bilateral pleural effusions are stable. Electronically Signed   By: Marybelle Killings M.D.   On: 05/18/2015 11:45   Dg Sinus/fist Tube Chk-non Gi  06/01/2015  ADDENDUM REPORT: 06/01/2015 09:53 CONTRAST:  10 cc Omnipaque 300. Electronically Signed   By: Rodena Goldmann.D.  On: 06/01/2015 09:53  06/01/2015  INDICATION: Followup pelvic abscess EXAM: ABSCESS INJECTION MEDICATIONS: The patient is currently admitted to the hospital and receiving intravenous antibiotics. The antibiotics were administered within an appropriate time frame prior to the initiation of the procedure. ANESTHESIA/SEDATION: None COMPLICATIONS: None immediate. PROCEDURE: Informed written consent was obtained from the patient after a thorough discussion of the procedural risks, benefits and alternatives. All questions were addressed. Maximal Sterile Barrier Technique was utilized including caps, mask, sterile gowns, sterile gloves, sterile drape, hand hygiene and skin antiseptic. A timeout was performed prior to the initiation of the procedure. Contrast was injected into the right trans gluteal pelvic abscess strain and imaging was obtained. FINDINGS: The pigtail drain is coiled within the abscess cavity. The abscess cavity is completely decompressed. There is contrast filling of the rectum and anal region secondary to a small fistula adjacent to the pigtail drain. IMPRESSION: The abscess cavity has decompressed. A small fistula is present to the rectum and Hartmann's pouch. The patient was instructed to follow-up in 2 weeks with continued drainage. Electronically Signed: By: Marybelle Killings M.D. On: 05/18/2015 11:47   Ct Image Guided Drainage By Percutaneous  Catheter  05/03/2015  CLINICAL DATA:  Pelvic abscess EXAM: CT IMAGE GUIDED DRAINAGE BY PERCUTANEOUS CATHETER FLUOROSCOPY TIME:  none MEDICATIONS AND MEDICAL HISTORY: Versed One mg, Fentanyl 25 mcg. Additional Medications: . ANESTHESIA/SEDATION: Moderate sedation time: 14 minutes CONTRAST:  Zero PROCEDURE: The procedure, risks, benefits, and alternatives were explained to the patient. Questions regarding the procedure were encouraged and answered. The patient understands and consents to the procedure. The left gluteal was prepped with ChloraPrep in a sterile fashion, and a sterile drape was applied covering the operative field. A sterile gown and sterile gloves were used for the procedure. Under CT guidance, an 18 gauge needle was inserted into the pelvic abscess via left trans gluteal approach. It was removed over an Amplatz wire. A 12 French dilator followed by a 12 [place] straight were inserted. It was looped and string fixed. Frank pus was aspirated. FINDINGS: Images document 68 French left trans gluteal pelvic abscess drain. COMPLICATIONS: Successful left 12 French trans gluteal pelvic abscess drain. Electronically Signed   By: Marybelle Killings M.D.   On: 05/03/2015 16:11    Labs:  CBC:  Recent Labs  05/05/15 0538 05/06/15 0555 05/07/15 0601 05/08/15 0631  WBC 8.3 10.2 8.8 7.6  HGB 8.2* 8.4* 8.4* 8.1*  HCT 27.2* 28.0* 28.0* 26.3*  PLT 267 284 276 238    COAGS:  Recent Labs  09/26/14 0650 03/02/15 2045 03/03/15 0626 05/03/15 0556  INR 0.97 1.07 1.28 1.35    BMP:  Recent Labs  05/05/15 0538 05/06/15 0555 05/07/15 0601 05/08/15 0631  NA 140 137 140 138  K 3.5 3.4* 4.0 3.7  CL 109 108 110 109  CO2 23 20* 23 23  GLUCOSE 98 96 103* 83  BUN 12 11 10 9   CALCIUM 8.5* 8.2* 8.3* 8.5*  CREATININE 1.09* 1.40* 1.30* 1.13*  GFRNONAA 47* 35* 38* 45*  GFRAA 55* 41* 44* 53*    LIVER FUNCTION TESTS:  Recent Labs  03/06/15 0801 03/07/15 0420 03/20/15 0700 05/02/15 1237  BILITOT  0.9 0.5 0.5 0.4  AST 24 22 13* 12*  ALT 19 16 10* 8*  ALKPHOS 65 64 57 90  PROT 5.3* 4.9* 6.0* 5.8*  ALBUMIN 2.3* 2.1* 2.6* 2.4*    TUMOR MARKERS: No results for input(s): AFPTM, CEA, CA199, CHROMGRNA in the last 8760 hours.  Assessment and Plan:  Resolved pelvic abscess and fistula to the rectum.  Plan: Drain catheter removed today without difficulty.  Thank you for this interesting consult.  I greatly enjoyed meeting Merle R Whittingham and look forward to participating in their care.  A copy of this report was sent to the requesting provider on this date.  Electronically Signed: Greggory Keen 06/01/2015, 1:46 PM   I spent a total of    15 Minutes in face to face in clinical consultation, greater than 50% of which was counseling/coordinating care for this patient with pelvic abscess.

## 2015-06-08 ENCOUNTER — Encounter: Payer: Self-pay | Admitting: *Deleted

## 2015-06-14 ENCOUNTER — Encounter: Payer: Self-pay | Admitting: Internal Medicine

## 2015-06-14 ENCOUNTER — Ambulatory Visit (INDEPENDENT_AMBULATORY_CARE_PROVIDER_SITE_OTHER): Payer: Medicare Other | Admitting: Internal Medicine

## 2015-06-14 VITALS — BP 210/80 | HR 60 | Ht 62.0 in | Wt 132.0 lb

## 2015-06-14 DIAGNOSIS — K635 Polyp of colon: Secondary | ICD-10-CM

## 2015-06-14 DIAGNOSIS — IMO0001 Reserved for inherently not codable concepts without codable children: Secondary | ICD-10-CM

## 2015-06-14 DIAGNOSIS — R112 Nausea with vomiting, unspecified: Secondary | ICD-10-CM | POA: Diagnosis not present

## 2015-06-14 DIAGNOSIS — K449 Diaphragmatic hernia without obstruction or gangrene: Secondary | ICD-10-CM

## 2015-06-14 DIAGNOSIS — D126 Benign neoplasm of colon, unspecified: Secondary | ICD-10-CM

## 2015-06-14 DIAGNOSIS — K651 Peritoneal abscess: Secondary | ICD-10-CM

## 2015-06-14 DIAGNOSIS — I10 Essential (primary) hypertension: Secondary | ICD-10-CM

## 2015-06-14 DIAGNOSIS — T814XXD Infection following a procedure, subsequent encounter: Secondary | ICD-10-CM | POA: Diagnosis not present

## 2015-06-14 MED ORDER — ONDANSETRON 4 MG PO TBDP: ORAL_TABLET | ORAL | Status: DC

## 2015-06-14 MED ORDER — METOCLOPRAMIDE HCL 5 MG PO TABS: 5.0000 mg | ORAL_TABLET | Freq: Three times a day (TID) | ORAL | Status: DC

## 2015-06-14 NOTE — Patient Instructions (Signed)
Please increase your Hydralazine to 50 mg three times daily. You should have your primary care physician follow your blood pressure closely since it was so elevated today.  Continue Nexium.  You may increase your Zofran to 1-2 tablets every 6-8 hours as needed. Do NOT exceed 6 tablets in 24 hours.  We have sent the following medications to your pharmacy for you to pick up at your convenience: Reglan 5 mg three times daily before meals.   Call our office if you have side effects from Reglan or if you would like to discuss increasing dosage.

## 2015-06-14 NOTE — Progress Notes (Signed)
Subjective:    Patient ID: [name], female    DOB: 10-16-1934, 81 y.o.   MRN: [id number]  HPI [name] is a 81 year old female with history of perforated sigmoid diverticulitis complicated by intra-abdominal abscesses and status post Hartman's procedure with end colostomy, recently discovered ascending colon mass found to be tubulovillous adenoma, history of large hiatal hernia with partial reduction and segmental gastrectomy, and recent hospitalization for pneumonia and drainage of intra-abdominal abscesses here for follow-up. She is here today with her youngest sister, [name]. She was last seen in the office on 05/02/2015 after which time she was admitted to the hospital for pneumonia and her intra-abdominal abscesses. IR was involved in placed external drains which have some swelling been removed. She met with Dr. Zella Richer on 05/11/2015 to discuss right colectomy for her large right colon mass.  Overall she is feeling better. Her energy levels are starting to come back to normal. She's been a bit more active. She continues to have a difficult time eating a full meal and intermittent nausea with dry heaving. She is not having abdominal pain but just more the feeling of nausea and early satiety. This is been present for sometime after her attempted hiatal hernia repair and segmental gastrectomy. She is using Zofran 4 mg which takes "the edge off" but does not fully relieve symptoms. She is using Nexium 40 mg once daily. Bowel movements through her ostomy has been more normal and she has not been as constipated. She is using MiraLAX 17 g once daily. There has been no blood in her ostomy or melena. She's noticed a bulge of the ostomy which is not painful  Today her blood pressure is quite elevated and she has noted this at home as well. She was started on hydralazine in the hospital he continues on 25 mg every 8 hours. She's tried to take it exactly every 8 hours and so getting in only 2 doses on  some days.  Review of Systems As per history of present illness, otherwise negative  Current Medications, Allergies, Past Medical History, Past Surgical History, Family History and Social History were reviewed in [employer] record.     Objective:   Physical Exam BP 210/80 mmHg  Pulse 60  Ht 5' 2"  (1.575 m)  Wt 132 lb (59.875 kg)  BMI 24.14 kg/m2 Constitutional: Well-developed Somewhat chronically ill-appearing. No distress. HEENT: Normocephalic and atraumatic. Oropharynx is clear and moist. No oropharyngeal exudate. Conjunctivae are pale.  No scleral icterus. Neck: Neck supple. Trachea midline. Cardiovascular: Normal rate, regular rhythm and intact distal pulses. No M/R/G Pulmonary/chest: Effort normal and breath sounds normal slightly decreased in bilateral bases. No wheezing, rales or rhonchi. Abdominal: Soft, nontender, nondistended. Bowel sounds active throughout. There appears to be a stomal hernia which is easily reduced with the patient lies supine without tenderness, well-healed abdominal scars and ostomy in the left lower quadrant  Extremities: no clubbing, cyanosis, or edema Neurological: Alert and oriented to person place and time. Skin: Skin is warm and dry.  Psychiatric: Normal mood and affect. Behavior is normal.  Repeat BP is 186/96  Cross-sectional imaging reviewed from last several months.     Assessment & Plan:   81 year old female with history of perforated sigmoid diverticulitis complicated by intra-abdominal abscesses and status post Hartman's procedure with end colostomy, recently discovered ascending colon mass found to be tubulovillous adenoma, history of large hiatal hernia with partial reduction and segmental gastrectomy, and recent hospitalization for pneumonia and drainage  of intra-abdominal abscesses here for follow-up.  1. Intra-abdominal abscesses after perforated sigmoid diverticulitis status post Hartman's procedure --  improved after antibiotics and percutaneous drainage. Percutaneous drains have been removed.  2. Tubulovillous adenoma of the right colon -- large mass which needs surgical resection and she is following with Dr. Zella Richer. She understands that there may be cancer in some portions of this mass which was only sampled. Dr. Zella Richer understandably wanted her to be stronger while entering the surgery. She has follow-up with him in April 2017. It is not understanding that he is planning possible colonic resection with ileorectal anastomosis.  3. Pneumonia -- improved after hospitalization and antibiotics  4. History of partial gastrectomy/hiatal hernia/nausea -- this continues to be a problem for her regarding eating with early satiety and nausea. On that a try her on low-dose metoclopramide 5 mg before meals. We discussed the potential for side effects and she is to stop the medicine and notify me should she develop tremor or any concerning side effects. She may need a higher dose of this medicine is helpful. She can also use Zofran 4-8 mg every 6-8 hours as needed for nausea or dry heaving. Do not exceed 24 mg in 24 hours. Continue Nexium 40 mg daily  5. Hypertension -- significant issue for her today. She is on max dose losartan and metoprolol is at the max dose based on her heart rate. Hydralazine was started in the hospital I've asked that she take this 3 times a day and increase the dose to 50 mg with each dose. Monitor blood pressure at home and I have sent a letter to her primary care provider for close follow-up for this issue. She is also instructed to notify primary care of the changes I've made and the need for follow-up  Return office visit with me in 3-4 months, sooner if necessary 25 minutes spent with the patient today. Greater than 50% was spent in counseling and coordination of care with the patient

## 2015-06-16 ENCOUNTER — Observation Stay (HOSPITAL_COMMUNITY)
Admission: EM | Admit: 2015-06-16 | Discharge: 2015-06-19 | Disposition: A | Discharge status: Home / Self Care | Payer: Medicare Other | Attending: Internal Medicine | Admitting: Internal Medicine

## 2015-06-16 ENCOUNTER — Emergency Department (HOSPITAL_COMMUNITY): Payer: Medicare Other

## 2015-06-16 DIAGNOSIS — J90 Pleural effusion, not elsewhere classified: Secondary | ICD-10-CM | POA: Insufficient documentation

## 2015-06-16 DIAGNOSIS — N183 Chronic kidney disease, stage 3 unspecified: Secondary | ICD-10-CM | POA: Diagnosis present

## 2015-06-16 DIAGNOSIS — L8990 Pressure ulcer of unspecified site, unspecified stage: Secondary | ICD-10-CM | POA: Diagnosis present

## 2015-06-16 DIAGNOSIS — I2510 Atherosclerotic heart disease of native coronary artery without angina pectoris: Secondary | ICD-10-CM | POA: Diagnosis not present

## 2015-06-16 DIAGNOSIS — I130 Hypertensive heart and chronic kidney disease with heart failure and stage 1 through stage 4 chronic kidney disease, or unspecified chronic kidney disease: Secondary | ICD-10-CM | POA: Diagnosis not present

## 2015-06-16 DIAGNOSIS — R0602 Shortness of breath: Secondary | ICD-10-CM | POA: Diagnosis present

## 2015-06-16 DIAGNOSIS — S2243XA Multiple fractures of ribs, bilateral, initial encounter for closed fracture: Secondary | ICD-10-CM | POA: Diagnosis present

## 2015-06-16 DIAGNOSIS — F329 Major depressive disorder, single episode, unspecified: Secondary | ICD-10-CM | POA: Diagnosis not present

## 2015-06-16 DIAGNOSIS — I509 Heart failure, unspecified: Secondary | ICD-10-CM | POA: Diagnosis not present

## 2015-06-16 DIAGNOSIS — Z79899 Other long term (current) drug therapy: Secondary | ICD-10-CM | POA: Diagnosis not present

## 2015-06-16 DIAGNOSIS — F17210 Nicotine dependence, cigarettes, uncomplicated: Secondary | ICD-10-CM | POA: Diagnosis not present

## 2015-06-16 DIAGNOSIS — M549 Dorsalgia, unspecified: Secondary | ICD-10-CM | POA: Insufficient documentation

## 2015-06-16 DIAGNOSIS — I5031 Acute diastolic (congestive) heart failure: Secondary | ICD-10-CM | POA: Diagnosis present

## 2015-06-16 DIAGNOSIS — R0600 Dyspnea, unspecified: Secondary | ICD-10-CM

## 2015-06-16 DIAGNOSIS — I10 Essential (primary) hypertension: Secondary | ICD-10-CM | POA: Insufficient documentation

## 2015-06-16 DIAGNOSIS — K6389 Other specified diseases of intestine: Secondary | ICD-10-CM | POA: Diagnosis present

## 2015-06-16 DIAGNOSIS — D500 Iron deficiency anemia secondary to blood loss (chronic): Secondary | ICD-10-CM | POA: Diagnosis present

## 2015-06-16 DIAGNOSIS — F419 Anxiety disorder, unspecified: Secondary | ICD-10-CM | POA: Diagnosis present

## 2015-06-16 LAB — CBC WITH DIFFERENTIAL/PLATELET
BASOS ABS: 0 10*3/uL (ref 0.0–0.1)
Basophils Relative: 0 %
EOS PCT: 1 %
Eosinophils Absolute: 0.1 10*3/uL (ref 0.0–0.7)
HCT: 34.5 % — ABNORMAL LOW (ref 36.0–46.0)
Hemoglobin: 10.8 g/dL — ABNORMAL LOW (ref 12.0–15.0)
LYMPHS ABS: 2.8 10*3/uL (ref 0.7–4.0)
LYMPHS PCT: 35 %
MCH: 25.5 pg — AB (ref 26.0–34.0)
MCHC: 31.3 g/dL (ref 30.0–36.0)
MCV: 81.4 fL (ref 78.0–100.0)
MONO ABS: 0.6 10*3/uL (ref 0.1–1.0)
Monocytes Relative: 8 %
Neutro Abs: 4.5 10*3/uL (ref 1.7–7.7)
Neutrophils Relative %: 56 %
PLATELETS: 203 10*3/uL (ref 150–400)
RBC: 4.24 MIL/uL (ref 3.87–5.11)
RDW: 18.1 % — AB (ref 11.5–15.5)
WBC: 8 10*3/uL (ref 4.0–10.5)

## 2015-06-16 LAB — BASIC METABOLIC PANEL
Anion gap: 9 (ref 5–15)
BUN: 15 mg/dL (ref 6–20)
CALCIUM: 8.9 mg/dL (ref 8.9–10.3)
CO2: 27 mmol/L (ref 22–32)
CREATININE: 1.18 mg/dL — AB (ref 0.44–1.00)
Chloride: 104 mmol/L (ref 101–111)
GFR calc Af Amer: 50 mL/min — ABNORMAL LOW (ref >60)
GFR, EST NON AFRICAN AMERICAN: 43 mL/min — AB (ref >60)
GLUCOSE: 104 mg/dL — AB (ref 65–99)
Potassium: 3.6 mmol/L (ref 3.5–5.1)
Sodium: 140 mmol/L (ref 135–145)

## 2015-06-16 LAB — TROPONIN I: Troponin I: 0.03 ng/mL (ref <0.031)

## 2015-06-16 LAB — BRAIN NATRIURETIC PEPTIDE: B Natriuretic Peptide: 1250 pg/mL — ABNORMAL HIGH (ref 0.0–100.0)

## 2015-06-16 NOTE — ED Notes (Signed)
Pt in by ems for cough and sob.  Pt denies cp or other complaints

## 2015-06-16 NOTE — ED Provider Notes (Signed)
CSN: [id number]     Arrival date & time 06/16/15  2153 History  By signing my name below, I, Helane Gunther, attest that this documentation has been prepared under the direction and in the presence of Elnora Morrison, MD. Electronically Signed: Helane Gunther, ED Scribe. 06/16/2015. 10:50 PM.     Chief Complaint  Patient presents with  . Shortness of Breath    The history is provided by the patient and a relative. No language interpreter was used.   HPI Comments: [name] is a 81 y.o. female former smoker with a PMHx of A-fib, CAD, CHF, bradycardia, pulmonary HTN, and pure hypercholesteremia brought in by ambulance, who presents to the Emergency Department complaining of worsening SOB onset 1 day ago. She reports associated productive cough and mid-back pain. Per daughter, pt had a hernia surgery, and then on November 31st, 2015 pt was in the hospital and was diagnosed with a perforated colon on Dec 1st, when pt had to be operated again. She notes that pt seems to never have fully recovered from the surgeries and has recently had PNA. Pt notes she was hospitalized with CHF in the past, but that her cardiologist told her the slow heart rate was due to her pacemaker. She notes she is not on oxygen at home. She denies a PMHx of PE and DVT. Pt denies fever, chills, and leg-swelling.   Past Medical History  Diagnosis Date  . Depressive disorder, not elsewhere classified   . Anxiety state, unspecified   . Pure hypercholesterolemia   . Esophageal reflux   . Infection of esophagostomy ([place])   . Congestive heart failure, unspecified     diastolic heart failure  . Pulmonary hypertension (HCC)     PA systolic pressure [id number] mmHg by echocardiogram, PA pressure 33/10 by cardiac catheterization PA saturation 62% thermodilution cardiac index 2.0 thick cardiac index 2.4  . Bradycardia     previously with coreg  . Tricuspid regurgitation     moderate tricuspid regurgitation by echocardiogram, prior use  of anorexic agents  . Melanoma ([place]) 1995    removal on back   . Coronary artery disease     a. Moderate to severe coronary disease involving the left anterior descending artery and right coronary artery.  Sequential stenosis in the LAD is significant.  Right coronary artery is moderate to severely likely nonischemic. Questionable small LVOT obstruction.  No Brockenbrough maneuver was performed.  Catheterization January 2013; b. 05/2014 MV no isch/infarct, EF 60%.  Marland Kitchen PONV (postoperative nausea and vomiting)   . Hypertension   . H/O hiatal hernia   . Gastritis   . [name]'s esophagus   . IBS (irritable bowel syndrome)   . Macular degeneration   . Dysrhythmia   . Diverticulitis   . Perforated bowel ([place])   . Anemia     post operative  . Atrial fibrillation with RVR ([place]) 09/06/2012  . Perforated sigmoid colon ([place]) 03/03/2015  . Cellulitis of extremity 09/06/2012  . Pelvic abscess in female    Past Surgical History  Procedure Laterality Date  . Nissen fundoplication  99991111  . Rotator cuff repair  ~ 2001    right  . Coronary angioplasty with stent placement  05/08/11    "1"  . Tonsillectomy  ~ 1944  . Shoulder arthroscopy  ~ 2004; 2005    left; "joint's wore out"  . Cataract extraction w/ intraocular lens  implant, bilateral  ~ 2002  . Tubal ligation  1972  . Percutaneous  coronary stent intervention (pci-s) N/A 05/08/2011    Procedure: PERCUTANEOUS CORONARY STENT INTERVENTION (PCI-S);  Surgeon: Wellington Hampshire, MD;  Location[facility] CATH LAB;  Service: Cardiovascular;  Laterality: N/A;  . Esophageal manometry N/A 06/06/2014    Procedure: ESOPHAGEAL MANOMETRY (EM);  Surgeon: Jerene Bears, MD;  Location: [facility] ENDOSCOPY;  Service: Gastroenterology;  Laterality: N/A;  . Laparoscopic nissen fundoplication N/A [id number]    Procedure: LAPAROSCOPIC  LYSIS OF ADHESIONS, SEGMENTAL GASTRECTOMY, PARTIAL REDUCTION OF HERNIA;  Surgeon: [name], MD;  Location: [facility] ORS;  Service: General;  Laterality: N/A;   . Hernia repair    . Colectomy with colostomy creation/hartmann procedure N/A 03/05/2015    Procedure: PARTIAL COLECTOMY WITH COLOSTOMY CREATION/HARTMANN PROCEDURE;  Surgeon: Aviva Signs, MD;  Location: [facility] ORS;  Service: General;  Laterality: N/A;  . Cardiac catheterization Left 03/05/2015    Procedure: CENTRAL LINE INSERTION;  Surgeon: Aviva Signs, MD;  Location: [facility] ORS;  Service: General;  Laterality: Left;   Family History  Problem Relation Age of Onset  . Heart attack Father   . Hypertension Father   . Diabetes Mother   . Hypertension Mother   . Colon cancer Neg Hx   . Esophageal cancer Neg Hx   . Rectal cancer Neg Hx   . Stomach cancer Neg Hx   . Skin cancer Brother     melanoma   Social History  Substance Use Topics  . Smoking status: Former Smoker -- 1.00 packs/day for 20 years    Types: Cigarettes    Start date: 10/12/1958    Quit date: 04/26/1988  . Smokeless tobacco: Never Used  . Alcohol Use: No   OB History    No data available     Review of Systems  Constitutional: Negative for fever and chills.  Respiratory: Positive for cough and shortness of breath.   Cardiovascular: Negative for leg swelling.  Musculoskeletal: Positive for back pain.  All other systems reviewed and are negative.   Allergies  Sulfamethoxazole  Home Medications   Prior to Admission medications   Medication Sig Start Date End Date Taking? Authorizing Provider  albuterol (PROAIR HFA) 108 (90 BASE) MCG/ACT inhaler Inhale 2 puffs into the lungs every 4 (four) hours.    Yes Historical Provider, MD  ALPRAZolam (XANAX) 0.25 MG tablet Take 1 tablet (0.25 mg total) by mouth 2 (two) times daily as needed for anxiety. 05/08/15  Yes Theodis Blaze, MD  citalopram (CELEXA) 40 MG tablet Take 0.5 tablets (20 mg total) by mouth daily. Patient taking differently: Take 40 mg by mouth at bedtime.  09/12/12  Yes Nimish Luther Parody, MD  esomeprazole (NEXIUM) 40 MG capsule Take 40 mg by mouth daily before  breakfast.    Yes Historical Provider, MD  ferrous sulfate 325 (65 FE) MG EC tablet Take 325 mg by mouth 3 (three) times daily.    Yes Historical Provider, MD  hydrALAZINE (APRESOLINE) 25 MG tablet Take 1 tablet (25 mg total) by mouth every 8 (eight) hours. 05/08/15  Yes Theodis Blaze, MD  losartan (COZAAR) 100 MG tablet Take 1 tablet (100 mg total) by mouth daily. 11/11/12  Yes Herminio Commons, MD  metoCLOPramide (REGLAN) 5 MG tablet Take 1 tablet (5 mg total) by mouth 3 (three) times daily before meals. 06/14/15  Yes Jerene Bears, MD  metoprolol tartrate (LOPRESSOR) 25 MG tablet Take 1 tablet (25 mg total) by mouth 2 (two) times daily. 03/14/15  Yes Albertine Patricia, MD  Multiple  Vitamins-Minerals (PRESERVISION AREDS) TABS Take 1 tablet by mouth 2 (two) times daily.    Yes Historical Provider, MD  nitroGLYCERIN (NITROSTAT) 0.4 MG SL tablet Place 0.4 mg under the tongue every 5 (five) minutes as needed for chest pain. Reported on 04/26/2015   Yes Historical Provider, MD  ondansetron (ZOFRAN ODT) 4 MG disintegrating tablet Take 1-2 tablets by mouth every 6-8 hours AS NEEDED. Do not exceed 6 tablets in 24 hour period. 06/14/15  Yes Jerene Bears, MD  Propylene Glycol (SYSTANE BALANCE OP) Place 1-2 drops into both eyes daily as needed (dry eyes). Reported on 04/26/2015   Yes Historical Provider, MD  rosuvastatin (CRESTOR) 10 MG tablet Take 10 mg by mouth at bedtime.    Yes Historical Provider, MD  traZODone (DESYREL) 100 MG tablet Take 100 mg by mouth at bedtime.    Yes Historical Provider, MD  Vitamin D, Ergocalciferol, (DRISDOL) 50000 UNITS CAPS Take 50,000 Units by mouth every Tuesday.    Yes Historical Provider, MD   BP 229/77 mmHg  Pulse 75  Temp(Src) 98 F (36.7 C) (Oral)  Resp 24  Ht 5\' 2"  (1.575 m)  Wt 132 lb (59.875 kg)  BMI 24.14 kg/m2  SpO2 98% Physical Exam  Constitutional: She is oriented to person, place, and time. She appears well-developed and well-nourished.  HENT:  Head:  Normocephalic and atraumatic.  Mildly dry mucous membranes  Eyes: Conjunctivae are normal. Right eye exhibits no discharge. Left eye exhibits no discharge.  Cardiovascular: Normal rate.   Pulmonary/Chest: Effort normal. No respiratory distress. She has decreased breath sounds in the right middle field and the right lower field. She has rales.  Decreased breath sounds in right lower and mid, mild rales   Abdominal: Soft.  Musculoskeletal: She exhibits no edema.  Neurological: She is alert and oriented to person, place, and time. Coordination normal.  Skin: Skin is warm and dry. No rash noted. She is not diaphoretic. No erythema.  Psychiatric: She has a normal mood and affect.  Nursing note and vitals reviewed.   ED Course  Procedures  DIAGNOSTIC STUDIES: Oxygen Saturation is 98% on 2 L/min [place], normal by my interpretation.    COORDINATION OF CARE: 10:46 PM - Discussed plans to order diagnostic studies and imaging. Pt advised of plan for treatment and pt agrees.  Labs Review Labs Reviewed  CBC WITH DIFFERENTIAL/PLATELET - Abnormal; Notable for the following:    Hemoglobin 10.8 (*)    HCT 34.5 (*)    MCH 25.5 (*)    RDW 18.1 (*)    All other components within normal limits  BASIC METABOLIC PANEL - Abnormal; Notable for the following:    Glucose, Bld 104 (*)    Creatinine, Ser 1.18 (*)    GFR calc non Af Amer 43 (*)    GFR calc Af Amer 50 (*)    All other components within normal limits  BRAIN NATRIURETIC PEPTIDE - Abnormal; Notable for the following:    B Natriuretic Peptide 1250.0 (*)    All other components within normal limits  TROPONIN I    Imaging Review Dg Chest 2 View  06/16/2015  CLINICAL DATA:  Productive cough and shortness of breath for the past 2 days. Bradycardia. EXAM: CHEST  2 VIEW COMPARISON:  05/07/2015. FINDINGS: Stable enlarged cardiac silhouette. The moderate-sized right pleural effusion and small left pleural effusion, both decreased. Decreased associated  airspace opacity at both lung bases. Stable prominence of the interstitial markings. Previously noted hiatal hernia. Diffuse  osteopenia. Thoracic spine and bilateral shoulder degenerative changes. Upper abdominal surgical clips. IMPRESSION: 1. Bilateral pleural effusions, decreased. 2. Decreased bibasilar atelectasis and possible pneumonia. 3. Stable cardiomegaly, chronic interstitial lung disease and hiatal hernia. Electronically Signed   By: Claudie Revering M.D.   On: 06/16/2015 23:25   I have personally reviewed and evaluated these images and lab results as part of my medical decision-making.   EKG Interpretation   Date/Time:  Friday June 16 2015 22:46:02 EDT Ventricular Rate:  59 PR Interval:  121 QRS Duration: 101 QT Interval:  436 QTC Calculation: 432 R Axis:   13 Text Interpretation:  Sinus rhythm Ventricular trigeminy Anterior infarct,  old Confirmed by Raziel Koenigs  MD, Fredrik Mogel ([id number]) on 06/16/2015 11:52:24 PM      MDM   Final diagnoses:  Essential hypertension  Dyspnea  Pleural effusion  Acute diastolic heart failure ([place])   Patient presents with worsening dyspnea and cough. Patient blood pressure significantly elevated she has been compliant with her medications. Patient denies any known heart failure history. Patient's been treated for colon mass and recent surgeries and hospitalizations. No blood clot history, no unilateral leg swelling. Patient has poor kidney function unable to get CT angiogram of the chest. Concern for pleural effusion and possible pneumonia.  Patient 92% on arrival patient placed on nasal cannula. Discussed with hospitalist plan for walking pulse ox, CT chest for further details and then decision on admission versus close outpatient follow-up.  Patient care signed out to Dr. Kathrynn Humble.   Elnora Morrison, MD 06/17/15 2329

## 2015-06-17 ENCOUNTER — Emergency Department (HOSPITAL_COMMUNITY): Payer: Medicare Other

## 2015-06-17 ENCOUNTER — Encounter (HOSPITAL_COMMUNITY): Payer: Self-pay | Admitting: *Deleted

## 2015-06-17 ENCOUNTER — Observation Stay (HOSPITAL_BASED_OUTPATIENT_CLINIC_OR_DEPARTMENT_OTHER): Payer: Medicare Other

## 2015-06-17 DIAGNOSIS — I2510 Atherosclerotic heart disease of native coronary artery without angina pectoris: Secondary | ICD-10-CM

## 2015-06-17 DIAGNOSIS — I509 Heart failure, unspecified: Secondary | ICD-10-CM

## 2015-06-17 DIAGNOSIS — I130 Hypertensive heart and chronic kidney disease with heart failure and stage 1 through stage 4 chronic kidney disease, or unspecified chronic kidney disease: Secondary | ICD-10-CM | POA: Diagnosis not present

## 2015-06-17 DIAGNOSIS — F418 Other specified anxiety disorders: Secondary | ICD-10-CM | POA: Diagnosis not present

## 2015-06-17 DIAGNOSIS — I5031 Acute diastolic (congestive) heart failure: Secondary | ICD-10-CM | POA: Diagnosis present

## 2015-06-17 DIAGNOSIS — R0600 Dyspnea, unspecified: Secondary | ICD-10-CM | POA: Diagnosis not present

## 2015-06-17 DIAGNOSIS — D500 Iron deficiency anemia secondary to blood loss (chronic): Secondary | ICD-10-CM | POA: Diagnosis not present

## 2015-06-17 DIAGNOSIS — I10 Essential (primary) hypertension: Secondary | ICD-10-CM | POA: Diagnosis not present

## 2015-06-17 DIAGNOSIS — N183 Chronic kidney disease, stage 3 (moderate): Secondary | ICD-10-CM | POA: Diagnosis not present

## 2015-06-17 DIAGNOSIS — L8990 Pressure ulcer of unspecified site, unspecified stage: Secondary | ICD-10-CM | POA: Diagnosis present

## 2015-06-17 LAB — LACTIC ACID, PLASMA
LACTIC ACID, VENOUS: 1.3 mmol/L (ref 0.5–2.0)
Lactic Acid, Venous: 1.1 mmol/L (ref 0.5–2.0)

## 2015-06-17 LAB — FERRITIN: FERRITIN: 48 ng/mL (ref 11–307)

## 2015-06-17 LAB — ECHOCARDIOGRAM COMPLETE
HEIGHTINCHES: 62 in
WEIGHTICAEL: 2000 [oz_av]

## 2015-06-17 LAB — RETICULOCYTES
RBC.: 4.37 MIL/uL (ref 3.87–5.11)
RETIC CT PCT: 1.8 % (ref 0.4–3.1)
Retic Count, Absolute: 78.7 10*3/uL (ref 19.0–186.0)

## 2015-06-17 LAB — MAGNESIUM: MAGNESIUM: 1.6 mg/dL — AB (ref 1.7–2.4)

## 2015-06-17 LAB — FOLATE: Folate: 11.7 ng/mL (ref >5.9)

## 2015-06-17 LAB — VITAMIN B12: Vitamin B-12: 336 pg/mL (ref 180–914)

## 2015-06-17 LAB — PROCALCITONIN: Procalcitonin: <0.1 ng/mL

## 2015-06-17 LAB — IRON AND TIBC
Iron: 28 ug/dL (ref 28–170)
SATURATION RATIOS: 9 % — AB (ref 10.4–31.8)
TIBC: 302 ug/dL (ref 250–450)
UIBC: 274 ug/dL

## 2015-06-17 MED ORDER — SODIUM CHLORIDE 0.9 % IV SOLN: 250.0000 mL | INTRAVENOUS | Status: DC | PRN

## 2015-06-17 MED ORDER — MAGNESIUM SULFATE 2 GM/50ML IV SOLN
2.0000 g | Freq: Once | INTRAVENOUS | Status: AC
  Administered 2015-06-17: 2 g via INTRAVENOUS
  Filled 2015-06-17: qty 50

## 2015-06-17 MED ORDER — CETYLPYRIDINIUM CHLORIDE 0.05 % MT LIQD
7.0000 mL | Freq: Two times a day (BID) | OROMUCOSAL | Status: DC
  Administered 2015-06-17 – 2015-06-19 (×4): 7 mL via OROMUCOSAL

## 2015-06-17 MED ORDER — FUROSEMIDE 10 MG/ML IJ SOLN
20.0000 mg | Freq: Once | INTRAMUSCULAR | Status: AC
  Administered 2015-06-17: 20 mg via INTRAVENOUS
  Filled 2015-06-17: qty 2

## 2015-06-17 MED ORDER — ALBUTEROL SULFATE (2.5 MG/3ML) 0.083% IN NEBU
3.0000 mL | INHALATION_SOLUTION | RESPIRATORY_TRACT | Status: DC
  Administered 2015-06-17 (×4): 3 mL via RESPIRATORY_TRACT
  Filled 2015-06-17 (×4): qty 3

## 2015-06-17 MED ORDER — IOHEXOL 350 MG/ML SOLN
100.0000 mL | Freq: Once | INTRAVENOUS | Status: AC | PRN
  Administered 2015-06-17: 100 mL via INTRAVENOUS

## 2015-06-17 MED ORDER — LOSARTAN POTASSIUM 50 MG PO TABS
100.0000 mg | ORAL_TABLET | Freq: Every day | ORAL | Status: DC
  Administered 2015-06-17 – 2015-06-19 (×3): 100 mg via ORAL
  Filled 2015-06-17 (×3): qty 2

## 2015-06-17 MED ORDER — ALBUTEROL SULFATE (2.5 MG/3ML) 0.083% IN NEBU
3.0000 mL | INHALATION_SOLUTION | Freq: Four times a day (QID) | RESPIRATORY_TRACT | Status: DC
  Administered 2015-06-18 – 2015-06-19 (×6): 3 mL via RESPIRATORY_TRACT
  Filled 2015-06-17 (×7): qty 3

## 2015-06-17 MED ORDER — ISOSORB DINITRATE-HYDRALAZINE 20-37.5 MG PO TABS
1.0000 | ORAL_TABLET | Freq: Three times a day (TID) | ORAL | Status: DC
  Administered 2015-06-17 (×2): 1 via ORAL
  Filled 2015-06-17 (×7): qty 1

## 2015-06-17 MED ORDER — PRESERVISION AREDS PO TABS: 1.0000 | ORAL_TABLET | Freq: Two times a day (BID) | ORAL | Status: DC

## 2015-06-17 MED ORDER — ACETAMINOPHEN 325 MG PO TABS: 650.0000 mg | ORAL_TABLET | ORAL | Status: DC | PRN

## 2015-06-17 MED ORDER — HYDRALAZINE HCL 25 MG PO TABS
25.0000 mg | ORAL_TABLET | Freq: Three times a day (TID) | ORAL | Status: DC
  Administered 2015-06-17: 25 mg via ORAL
  Filled 2015-06-17: qty 1

## 2015-06-17 MED ORDER — SODIUM CHLORIDE 0.9% FLUSH: 3.0000 mL | INTRAVENOUS | Status: DC | PRN

## 2015-06-17 MED ORDER — NITROGLYCERIN 0.4 MG SL SUBL: 0.4000 mg | SUBLINGUAL_TABLET | SUBLINGUAL | Status: DC | PRN

## 2015-06-17 MED ORDER — OCUVITE-LUTEIN PO CAPS
1.0000 | ORAL_CAPSULE | Freq: Two times a day (BID) | ORAL | Status: DC
  Administered 2015-06-17 – 2015-06-19 (×5): 1 via ORAL
  Filled 2015-06-17 (×5): qty 1

## 2015-06-17 MED ORDER — VITAMIN D (ERGOCALCIFEROL) 1.25 MG (50000 UNIT) PO CAPS
50000.0000 [IU] | ORAL_CAPSULE | ORAL | Status: DC
  Filled 2015-06-17: qty 1

## 2015-06-17 MED ORDER — FERROUS SULFATE 325 (65 FE) MG PO TABS
325.0000 mg | ORAL_TABLET | Freq: Three times a day (TID) | ORAL | Status: DC
  Administered 2015-06-17 – 2015-06-19 (×7): 325 mg via ORAL
  Filled 2015-06-17 (×7): qty 1

## 2015-06-17 MED ORDER — FUROSEMIDE 10 MG/ML IJ SOLN
20.0000 mg | Freq: Two times a day (BID) | INTRAMUSCULAR | Status: DC
  Administered 2015-06-17 (×2): 20 mg via INTRAVENOUS
  Filled 2015-06-17 (×2): qty 2

## 2015-06-17 MED ORDER — PANTOPRAZOLE SODIUM 40 MG PO TBEC
40.0000 mg | DELAYED_RELEASE_TABLET | Freq: Every day | ORAL | Status: DC
  Administered 2015-06-17 – 2015-06-19 (×3): 40 mg via ORAL
  Filled 2015-06-17 (×3): qty 1

## 2015-06-17 MED ORDER — CITALOPRAM HYDROBROMIDE 20 MG PO TABS
40.0000 mg | ORAL_TABLET | Freq: Every day | ORAL | Status: DC
  Administered 2015-06-17 – 2015-06-18 (×2): 40 mg via ORAL
  Filled 2015-06-17 (×2): qty 2

## 2015-06-17 MED ORDER — ONDANSETRON 4 MG PO TBDP: 4.0000 mg | ORAL_TABLET | Freq: Three times a day (TID) | ORAL | Status: DC | PRN

## 2015-06-17 MED ORDER — HYDRALAZINE HCL 20 MG/ML IJ SOLN
10.0000 mg | INTRAMUSCULAR | Status: DC | PRN
  Administered 2015-06-17: 10 mg via INTRAVENOUS
  Filled 2015-06-17: qty 1

## 2015-06-17 MED ORDER — PROPYLENE GLYCOL 0.6 % OP SOLN: 2.0000 [drp] | Freq: Every day | OPHTHALMIC | Status: DC | PRN

## 2015-06-17 MED ORDER — ONDANSETRON HCL 4 MG/2ML IJ SOLN: 4.0000 mg | Freq: Four times a day (QID) | INTRAMUSCULAR | Status: DC | PRN

## 2015-06-17 MED ORDER — SODIUM CHLORIDE 0.9% FLUSH: 3.0000 mL | Freq: Two times a day (BID) | INTRAVENOUS | Status: DC

## 2015-06-17 MED ORDER — ROSUVASTATIN CALCIUM 10 MG PO TABS
10.0000 mg | ORAL_TABLET | Freq: Every day | ORAL | Status: DC
  Administered 2015-06-17 – 2015-06-18 (×2): 10 mg via ORAL
  Filled 2015-06-17 (×2): qty 1

## 2015-06-17 MED ORDER — METOPROLOL TARTRATE 25 MG PO TABS
12.5000 mg | ORAL_TABLET | Freq: Two times a day (BID) | ORAL | Status: DC
  Administered 2015-06-17 – 2015-06-19 (×5): 12.5 mg via ORAL
  Filled 2015-06-17 (×5): qty 1

## 2015-06-17 MED ORDER — POLYVINYL ALCOHOL 1.4 % OP SOLN: 2.0000 [drp] | OPHTHALMIC | Status: DC | PRN

## 2015-06-17 MED ORDER — HEPARIN SODIUM (PORCINE) 5000 UNIT/ML IJ SOLN
5000.0000 [IU] | Freq: Three times a day (TID) | INTRAMUSCULAR | Status: DC
  Administered 2015-06-17 – 2015-06-18 (×6): 5000 [IU] via SUBCUTANEOUS
  Filled 2015-06-17 (×6): qty 1

## 2015-06-17 MED ORDER — METOCLOPRAMIDE HCL 10 MG PO TABS
5.0000 mg | ORAL_TABLET | Freq: Three times a day (TID) | ORAL | Status: DC
  Administered 2015-06-17 – 2015-06-19 (×2): 5 mg via ORAL
  Filled 2015-06-17 (×3): qty 1

## 2015-06-17 MED ORDER — ALPRAZOLAM 0.25 MG PO TABS
0.2500 mg | ORAL_TABLET | Freq: Two times a day (BID) | ORAL | Status: DC | PRN
  Administered 2015-06-17 – 2015-06-18 (×3): 0.25 mg via ORAL
  Filled 2015-06-17 (×3): qty 1

## 2015-06-17 MED ORDER — LISINOPRIL 5 MG PO TABS: 5.0000 mg | ORAL_TABLET | Freq: Every day | ORAL | Status: DC

## 2015-06-17 MED ORDER — TRAZODONE HCL 50 MG PO TABS
100.0000 mg | ORAL_TABLET | Freq: Every day | ORAL | Status: DC
  Administered 2015-06-17 – 2015-06-18 (×2): 100 mg via ORAL
  Filled 2015-06-17 (×2): qty 2

## 2015-06-17 NOTE — ED Notes (Signed)
Pt was off her oxygen for a few minutes, pulse ox decreased to 89%RA, pt resp rate increased, pt placed back on oxygen at 2lpm via [place] with improvement in pulse ox to 99% and decrease in resp rate

## 2015-06-17 NOTE — ED Notes (Signed)
Pt c/o sob, cough that became worse today and is worse with exertion. Pt denies any fever, chills, states that her blood pressure was high in pcp office this past week and her blood pressure medication was increased,

## 2015-06-17 NOTE — Progress Notes (Addendum)
[facility] PROGRESS NOTE    Progress Note   Heather Oar Clodfelter [id number] DOB: Nov 16, 1934 DOA: 06/16/2015 PCP: Terald Sleeper, PA-C   Brief Narrative:   [name] is an 81 y.o. female with a complicated past medical history of paroxysmal atrial fibrillation not on anticoagulation, perforated sigmoid diverticulitis status post Hartmann pouch with a complicated intra-abdominal abscess requiring IR drainage placement, coronary artery sees heart failure, pulmonary hypertension who presents to the emergency room complaining of worsening shortness of breath that started 1 day prior to admission. She also relates productive cough. She relates she produces a thick white sputum, she denies any palpitations fever or chills. She relates no sick contacts. She was recently treated for pneumonia and she does relate achieving full recovery.   Assessment/Plan:   Acute diastolic (congestive) heart failure [facility]): Her BNP was 1250 on admission, requiring supplement oxygen. She had a recent 2-D echo in December 2016 that showed an EF of 55% no motion abnormality, and no diastolic dysfunction I agree with IV Lasix, follow strict I's and os and daily standing weights.  Cont Beta blocker, ARB and lasix. Repeat echo to re-evaluate systolic and diastolic function.   Essenital HTN (hypertension): Blood pressure is extremely high, add BiDil. She is borderline bradycardic.  Chronic kidney disease stage III: Going back through her records her creatinine has decreased a little bit, question if this likely due to volume overload. Cont check B-mets daily.  Coronary artery disease: No anginal signs, EKG without acute ischemic findings. Continue Crestor  Colonic mass: Will need follow-up as an outpatient.  Anemia due to chronic blood loss Check an anemia panel.  Anxiety and depression: Stable continue Celexa and Xanax. Trazodone for insomnia at bedtime.  DVT Prophylaxis - Lovenox ordered.  Family  Communication: none Disposition Plan: Home in 2-3 days Code Status:     Code Status Orders        Start     Ordered   06/17/15 0505  Full code   Continuous     06/17/15 0506    Code Status History    Date Active Date Inactive Code Status Order ID Comments User Context   05/02/2015 11:45 AM 05/08/2015  4:11 PM Full Code [id number]  Venetia Maxon Rama, MD Inpatient   03/05/2015  2:14 PM 03/14/2015  7:40 PM Full Code [id number]  Aviva Signs, MD Inpatient   03/03/2015  2:43 AM 03/05/2015  2:14 PM Full Code [id number]  Phillips Grout, MD Inpatient   09/26/2014 12:14 PM 09/28/2014  2:31 PM Full Code [id number]  [name], MD Inpatient   05/07/2014  9:25 PM 05/08/2014  8:12 PM Full Code [id number]  Lamar Sprinkles, MD Inpatient   09/06/2012  3:45 PM 09/12/2012  4:34 PM Full Code [id number]  Kathie Dike, MD Inpatient    Advance Directive Documentation        Most Recent Value   Type of Advance Directive  Living will   Pre-existing out of facility DNR order (yellow form or pink MOST form)     "MOST" Form in Place?          IV Access:    Peripheral IV   Procedures and diagnostic studies:   Dg Chest 2 View  06/16/2015  CLINICAL DATA:  Productive cough and shortness of breath for the past 2 days. Bradycardia. EXAM: CHEST  2 VIEW COMPARISON:  05/07/2015. FINDINGS: Stable enlarged cardiac silhouette. The moderate-sized right pleural effusion and small left pleural effusion, both decreased. Decreased associated airspace  opacity at both lung bases. Stable prominence of the interstitial markings. Previously noted hiatal hernia. Diffuse osteopenia. Thoracic spine and bilateral shoulder degenerative changes. Upper abdominal surgical clips. IMPRESSION: 1. Bilateral pleural effusions, decreased. 2. Decreased bibasilar atelectasis and possible pneumonia. 3. Stable cardiomegaly, chronic interstitial lung disease and hiatal hernia. Electronically Signed   By: Claudie Revering M.D.   On: 06/16/2015 23:25   Ct Chest  Wo Contrast  06/17/2015  CLINICAL DATA:  Acute onset of worsening shortness of breath, productive cough and mid back pain. Initial encounter. EXAM: CT CHEST WITHOUT CONTRAST TECHNIQUE: Multidetector CT imaging of the chest was performed following the standard protocol without IV contrast. COMPARISON:  CT of the chest performed 05/01/2015 FINDINGS: Moderate right and small left pleural effusions are noted, with bibasilar airspace opacities. This may reflect atelectasis or pneumonia. The expanded portions of both lungs appear grossly clear. Mild emphysematous change is noted at the upper lung lobes. Diffuse coronary artery calcifications are seen. A moderate hiatal hernia is noted, and the patient is status post recent fundoplication. Scattered calcifications noted along the aortic arch and proximal great vessels. No pericardial effusion is identified. The visualized portions of thyroid gland are grossly unremarkable. No axillary lymphadenopathy is seen. The visualized portions of the liver and spleen are grossly unremarkable. The visualized portions of the pancreas, adrenal glands and kidneys are within normal limits. No acute osseous abnormalities are seen. There are partially healed fractures of the left posterolateral eighth and ninth ribs, as well as partially healed fractures of the right lateral third through eighth ribs. IMPRESSION: 1. Moderate right and small left pleural effusions, with bibasilar airspace opacities. This may reflect atelectasis or pneumonia. 2. Mild emphysematous change at the upper lung lobes. 3. Diffuse coronary artery calcifications seen. 4. Moderate hiatal hernia noted. 5. Partially healed fractures of ribs bilaterally, more prominent on the right. Electronically Signed   By: Garald Balding M.D.   On: 06/17/2015 01:37   Ct Angio Chest Pe W/cm &/or Wo Cm  06/17/2015  CLINICAL DATA:  Acute onset worsening shortness of breath, productive cough and mid back pain. Initial encounter. EXAM:  CT ANGIOGRAPHY CHEST WITH CONTRAST TECHNIQUE: Multidetector CT imaging of the chest was performed using the standard protocol during bolus administration of intravenous contrast. Multiplanar CT image reconstructions and MIPs were obtained to evaluate the vascular anatomy. CONTRAST:  182mL OMNIPAQUE IOHEXOL 350 MG/ML SOLN COMPARISON:  CT the chest performed earlier today at 12:24 a.m. FINDINGS: There is no evidence of pulmonary embolus. Moderate right and small left pleural effusions are again noted, with partial consolidation of the right lower lobe. This may reflect atelectasis or pneumonia. Mild emphysematous change is noted at the lung apices. No pneumothorax is seen. No dominant masses are identified. Scattered coronary artery calcification is noted. The mediastinum is otherwise unremarkable. Scattered calcification is noted along the aortic arch and proximal great vessels. No significant pericardial effusion is seen. The great vessels are grossly unremarkable. No axillary lymphadenopathy is seen. The visualized portions of the thyroid gland are unremarkable in appearance. A moderate hiatal hernia is noted, with changes of prior Nissen fundoplication. The visualized portions of the liver and spleen are unremarkable. No acute osseous abnormalities are seen. Bilateral partially healed rib fractures are again noted. Review of the MIP images confirms the above findings. IMPRESSION: 1. No evidence of pulmonary embolus. 2. Moderate right and small left pleural effusions again noted, with partial consolidation of the right lower lobe. This may reflect atelectasis or pneumonia. 3.  Mild emphysematous change at the lung apices. 4. Scattered coronary artery calcifications noted. 5. Moderate hiatal hernia noted, with changes of prior Nissen fundoplication. 6. Bilateral partially healed rib fractures again noted. Electronically Signed   By: Garald Balding M.D.   On: 06/17/2015 01:46     Medical Consultants:     None.  Anti-Infectives:   Anti-infectives    None      Subjective:    [name] She relates her breathing is improved compare to admission. She still relates orthopnea.  Objective:    Filed Vitals:   06/17/15 0230 06/17/15 0300 06/17/15 0405 06/17/15 0807  BP: 215/85 218/85 200/84   Pulse: 66 61 72   Temp:   97.6 F (36.4 C)   TempSrc:      Resp: 14 22 20    Height:   5\' 2"  (1.575 m)   Weight:   56.7 kg (125 lb)   SpO2: 100% 100% 96% 99%    Intake/Output Summary (Last 24 hours) at 06/17/15 0913 Last data filed at 06/17/15 0223  Gross per 24 hour  Intake      0 ml  Output    700 ml  Net   -700 ml   Filed Weights   06/16/15 2159 06/17/15 0405  Weight: 59.875 kg (132 lb) 56.7 kg (125 lb)    Exam: Gen:  NAD Cardiovascular:  RRR,positive JVD  Chest and lungs:  Air movement with crackles on the right.  Abdomen:  Abdomen soft, NT/ND, + BS, ostomy bag in place no movement in the upper abdomen.  Extremities:  No edema.   Data Reviewed:    Labs: Basic Metabolic Panel:  Recent Labs Lab 06/16/15 2233 06/17/15 0515  NA 140  --   K 3.6  --   CL 104  --   CO2 27  --   GLUCOSE 104*  --   BUN 15  --   CREATININE 1.18*  --   CALCIUM 8.9  --   MG  --  1.6*   GFR Estimated Creatinine Clearance: 31.1 mL/min (by C-G formula based on Cr of 1.18). Liver Function Tests: No results for input(s): AST, ALT, ALKPHOS, BILITOT, PROT, ALBUMIN in the last 168 hours. No results for input(s): LIPASE, AMYLASE in the last 168 hours. No results for input(s): AMMONIA in the last 168 hours. Coagulation profile No results for input(s): INR, PROTIME in the last 168 hours.  CBC:  Recent Labs Lab 06/16/15 2233  WBC 8.0  NEUTROABS 4.5  HGB 10.8*  HCT 34.5*  MCV 81.4  PLT 203   Cardiac Enzymes:  Recent Labs Lab 06/16/15 2233  TROPONINI 0.03   BNP (last 3 results) No results for input(s): PROBNP in the last 8760 hours. CBG: No results for input(s): GLUCAP  in the last 168 hours. D-Dimer: No results for input(s): DDIMER in the last 72 hours. Hgb A1c: No results for input(s): HGBA1C in the last 72 hours. Lipid Profile: No results for input(s): CHOL, HDL, LDLCALC, TRIG, CHOLHDL, LDLDIRECT in the last 72 hours. Thyroid function studies: No results for input(s): TSH, T4TOTAL, T3FREE, THYROIDAB in the last 72 hours.  Invalid input(s): FREET3 Anemia work up: No results for input(s): VITAMINB12, FOLATE, FERRITIN, TIBC, IRON, RETICCTPCT in the last 72 hours. Sepsis Labs:  Recent Labs Lab 06/16/15 2233 06/17/15 0514 06/17/15 0515 06/17/15 0753  PROCALCITON  --   --  <0.10  --   WBC 8.0  --   --   --  LATICACIDVEN  --  1.3  --  1.1   Microbiology No results found for this or any previous visit (from the past 240 hour(s)).   Medications:   . albuterol  3 mL Inhalation Q4H  . antiseptic oral rinse  7 mL Mouth Rinse BID  . citalopram  40 mg Oral QHS  . ferrous sulfate  325 mg Oral TID  . furosemide  20 mg Intravenous BID  . heparin  5,000 Units Subcutaneous 3 times per day  . hydrALAZINE  25 mg Oral 3 times per day  . metoCLOPramide  5 mg Oral TID AC  . multivitamin-lutein  1 capsule Oral BID  . pantoprazole  40 mg Oral Daily  . rosuvastatin  10 mg Oral QHS  . sodium chloride flush  3 mL Intravenous Q12H  . traZODone  100 mg Oral QHS  . [START ON 06/20/2015] Vitamin D (Ergocalciferol)  50,000 Units Oral Q Tue   Continuous Infusions:   Time spent: 25 min     Charlynne Cousins  [facility] Pager [phone]  *Please refer to [email or web address], password [email or web address] to get updated schedule on who will round on this patient, as hospitalists switch teams weekly. If 7PM-7AM, please contact night-coverage at [email or web address], password [email or web address] for any overnight needs.  06/17/2015, 9:13 AM

## 2015-06-17 NOTE — ED Provider Notes (Signed)
  Physical Exam  BP 210/113 mmHg  Pulse 75  Temp(Src) 98 F (36.7 C) (Oral)  Resp 20  Ht 5\' 2"  (1.575 m)  Wt 132 lb (59.875 kg)  BMI 24.14 kg/m2  SpO2 98%  Physical Exam  ED Course  Procedures  MDM  Pt comes in with cc of dib. Hx of CHF, HTN, afib, colon mass that she is being worked up for. CXR shows pleural effusion - but improved compared to previous CXR.  Trop is neg. BNP is elevated.  CT w/o contrast ordered - r/o PNA.  If CT is neg - consider PE workup.  If entire workup is neg - she might need obs admission.   EKG Interpretation  Date/Time:  Friday June 16 2015 22:46:02 EDT Ventricular Rate:  59 PR Interval:  121 QRS Duration: 101 QT Interval:  436 QTC Calculation: 432 R Axis:   13 Text Interpretation:  Sinus rhythm Ventricular trigeminy Anterior infarct, old Confirmed by ZAVITZ  MD, JOSHUA ([id number]) on 06/16/2015 11:52:24 PM            Varney Biles, MD 06/17/15 [id number]

## 2015-06-17 NOTE — H&P (Signed)
[facility] History and Physical  Porfirio Oar Kushner [id number] DOB: 15-Sep-1934 DOA: 06/16/2015  Referring physician: ED physician PCP: Terald Sleeper, PA-C  Specialists: Dr. Zella Richer (surgery), Dr. Hilarie Fredrickson (GI)   Chief Complaint:  Cough and dyspnea   HPI: [name] is a 81 y.o. female with PMH of hypertension, depression, anxiety, paroxysmal atrial fibrillation no longer on anticoagulation, and perforated sigmoid diverticulitis status post Hartmann's pouch complicated by intra-abdominal abscess requiring IR drainage who now presents to the ED with 1 day of dyspnea with minimal exertion and cough. Patient has been undergoing workup for a large colonic mass, suspected of possibly representing a malignancy, but had otherwise been in her usual state until the development of fairly acute onset dyspnea the day prior to her admission. This was accompanied by cough productive of thick clear/white sputum. Patient denies chest pain, palpitations, fever, or chills. There is been no long distance travel or sick contacts. She was recently treated for a pneumonia but reports achieving full recovery since that time. Patient denies any lower extremity swelling at this time, though notes she has had it in the past. She reports previously being on a "water pill," but that had been discontinued.  Her symptoms were progressing to the point that she was dyspneic at rest, prompting her visit to the emergency department.  In ED, patient was found to be afebrile, requiring 2 L/m supplemental oxygen to maintain saturations in the 90s, and with blood pressure of 218/85. EKG was obtained and features a sinus rhythm with ventricular trigeminy. Chest x-Takesha is notable for bilateral pleural effusions which have improved since the previous study. Blood work was notable for a serum creatinine of 1.18, slightly above the patient's apparent baseline. CBC features a hemoglobin of 10.8, currently stable from priors. BNP is elevated  to a value of 1250 and troponin is 0.03. EDP obtained a noncontrast chest CT which confirmed the chest x-Razia revealed bilateral pleural effusions. Bibasilar atelectasis is also noted with pneumonia unable to be excluded. A CTA was then performed in the ED, negative for PE, and again replicating the initial findings of improved bilateral pleural effusions and bibasilar atelectasis with pneumonia unable to be excluded. Patient was taken off oxygen momentarily in the ED and quickly desaturated to the 80s. O2 sat recovered with 2 L/m supplemental oxygen restored. A 20 mg IV push of Lasix was given in the ED and, while unfortunately output was not measured, she reports large-volume urine and associated improvement in her symptoms. She has been admitted to the telemetry unit for ongoing evaluation and management of acute respiratory failure suspected secondary to acute decompensated CHF.   Where does patient live?   At home     Can patient participate in ADLs?  Yes         Review of Systems:   General: no fevers, chills, sweats, weight change, poor appetite, or fatigue HEENT: no blurry vision, hearing changes or sore throat Pulm: no wheeze. Dyspnea and cough.  CV: no chest pain or palpitations Abd: no nausea, vomiting, abdominal pain, diarrhea, or constipation GU: no dysuria, hematuria, increased urinary frequency, or urgency  Ext: no leg edema Neuro: no focal weakness, numbness, or tingling, no vision change or hearing loss Skin: no rash, no wounds MSK: No muscle spasm, no deformity, no red, hot, or swollen joint Heme: No easy bruising or bleeding Travel history: No recent long distant travel    Allergy:  Allergies  Allergen Reactions  . Sulfamethoxazole Rash  Past Medical History  Diagnosis Date  . Depressive disorder, not elsewhere classified   . Anxiety state, unspecified   . Pure hypercholesterolemia   . Esophageal reflux   . Infection of esophagostomy ([place])   . Congestive heart  failure, unspecified     diastolic heart failure  . Pulmonary hypertension (HCC)     PA systolic pressure [id number] mmHg by echocardiogram, PA pressure 33/10 by cardiac catheterization PA saturation 62% thermodilution cardiac index 2.0 thick cardiac index 2.4  . Bradycardia     previously with coreg  . Tricuspid regurgitation     moderate tricuspid regurgitation by echocardiogram, prior use of anorexic agents  . Melanoma ([place]) 1995    removal on back   . Coronary artery disease     a. Moderate to severe coronary disease involving the left anterior descending artery and right coronary artery.  Sequential stenosis in the LAD is significant.  Right coronary artery is moderate to severely likely nonischemic. Questionable small LVOT obstruction.  No Brockenbrough maneuver was performed.  Catheterization January 2013; b. 05/2014 MV no isch/infarct, EF 60%.  Marland Kitchen PONV (postoperative nausea and vomiting)   . Hypertension   . H/O hiatal hernia   . Gastritis   . [name]'s esophagus   . IBS (irritable bowel syndrome)   . Macular degeneration   . Dysrhythmia   . Diverticulitis   . Perforated bowel ([place])   . Anemia     post operative  . Atrial fibrillation with RVR ([place]) 09/06/2012  . Perforated sigmoid colon ([place]) 03/03/2015  . Cellulitis of extremity 09/06/2012  . Pelvic abscess in female     Past Surgical History  Procedure Laterality Date  . Nissen fundoplication  [id number]  . Rotator cuff repair  ~ 2001    right  . Coronary angioplasty with stent placement  05/08/11    "1"  . Tonsillectomy  ~ 1944  . Shoulder arthroscopy  ~ 2004; 2005    left; "joint's wore out"  . Cataract extraction w/ intraocular lens  implant, bilateral  ~ 2002  . Tubal ligation  1972  . Percutaneous coronary stent intervention (pci-s) N/A 05/08/2011    Procedure: PERCUTANEOUS CORONARY STENT INTERVENTION (PCI-S);  Surgeon: Wellington Hampshire, MD;  Location: [facility] CATH LAB;  Service: Cardiovascular;  Laterality: N/A;  . Esophageal  manometry N/A 06/06/2014    Procedure: ESOPHAGEAL MANOMETRY (EM);  Surgeon: Jerene Bears, MD;  Location: [facility] ENDOSCOPY;  Service: Gastroenterology;  Laterality: N/A;  . Laparoscopic nissen fundoplication N/A [id number]    Procedure: LAPAROSCOPIC  LYSIS OF ADHESIONS, SEGMENTAL GASTRECTOMY, PARTIAL REDUCTION OF HERNIA;  Surgeon: [name], MD;  Location: [facility] ORS;  Service: General;  Laterality: N/A;  . Hernia repair    . Colectomy with colostomy creation/hartmann procedure N/A 03/05/2015    Procedure: PARTIAL COLECTOMY WITH COLOSTOMY CREATION/HARTMANN PROCEDURE;  Surgeon: Aviva Signs, MD;  Location: [facility] ORS;  Service: General;  Laterality: N/A;  . Cardiac catheterization Left 03/05/2015    Procedure: CENTRAL LINE INSERTION;  Surgeon: Aviva Signs, MD;  Location: [facility] ORS;  Service: General;  Laterality: Left;    Social History:  reports that she quit smoking about 27 years ago. Her smoking use included Cigarettes. She started smoking about 56 years ago. She has a 20 pack-year smoking history. She has never used smokeless tobacco. She reports that she does not drink alcohol or use illicit drugs.  Family History:  Family History  Problem Relation Age of Onset  . Heart attack Father   .  Hypertension Father   . Diabetes Mother   . Hypertension Mother   . Colon cancer Neg Hx   . Esophageal cancer Neg Hx   . Rectal cancer Neg Hx   . Stomach cancer Neg Hx   . Skin cancer Brother     melanoma     Prior to Admission medications   Medication Sig Start Date End Date Taking? Authorizing Provider  albuterol (PROAIR HFA) 108 (90 BASE) MCG/ACT inhaler Inhale 2 puffs into the lungs every 4 (four) hours.    Yes Historical Provider, MD  ALPRAZolam (XANAX) 0.25 MG tablet Take 1 tablet (0.25 mg total) by mouth 2 (two) times daily as needed for anxiety. 05/08/15  Yes Theodis Blaze, MD  citalopram (CELEXA) 40 MG tablet Take 0.5 tablets (20 mg total) by mouth daily. Patient taking differently: Take 40 mg by mouth  at bedtime.  09/12/12  Yes Nimish Luther Parody, MD  esomeprazole (NEXIUM) 40 MG capsule Take 40 mg by mouth daily before breakfast.    Yes Historical Provider, MD  ferrous sulfate 325 (65 FE) MG EC tablet Take 325 mg by mouth 3 (three) times daily.    Yes Historical Provider, MD  hydrALAZINE (APRESOLINE) 25 MG tablet Take 1 tablet (25 mg total) by mouth every 8 (eight) hours. 05/08/15  Yes Theodis Blaze, MD  losartan (COZAAR) 100 MG tablet Take 1 tablet (100 mg total) by mouth daily. 11/11/12  Yes Herminio Commons, MD  metoCLOPramide (REGLAN) 5 MG tablet Take 1 tablet (5 mg total) by mouth 3 (three) times daily before meals. 06/14/15  Yes Jerene Bears, MD  metoprolol tartrate (LOPRESSOR) 25 MG tablet Take 1 tablet (25 mg total) by mouth 2 (two) times daily. 03/14/15  Yes Albertine Patricia, MD  Multiple Vitamins-Minerals (PRESERVISION AREDS) TABS Take 1 tablet by mouth 2 (two) times daily.    Yes Historical Provider, MD  nitroGLYCERIN (NITROSTAT) 0.4 MG SL tablet Place 0.4 mg under the tongue every 5 (five) minutes as needed for chest pain. Reported on 04/26/2015   Yes Historical Provider, MD  ondansetron (ZOFRAN ODT) 4 MG disintegrating tablet Take 1-2 tablets by mouth every 6-8 hours AS NEEDED. Do not exceed 6 tablets in 24 hour period. 06/14/15  Yes Jerene Bears, MD  Propylene Glycol (SYSTANE BALANCE OP) Place 1-2 drops into both eyes daily as needed (dry eyes). Reported on 04/26/2015   Yes Historical Provider, MD  rosuvastatin (CRESTOR) 10 MG tablet Take 10 mg by mouth at bedtime.    Yes Historical Provider, MD  traZODone (DESYREL) 100 MG tablet Take 100 mg by mouth at bedtime.    Yes Historical Provider, MD  Vitamin D, Ergocalciferol, (DRISDOL) 50000 UNITS CAPS Take 50,000 Units by mouth every Tuesday.    Yes Historical Provider, MD    Physical Exam: Filed Vitals:   06/17/15 0220 06/17/15 0230 06/17/15 0300 06/17/15 0405  BP: 219/84 215/85 218/85 200/84  Pulse: 67 66 61 72  Temp: 97.8 F (36.6 C)    97.6 F (36.4 C)  TempSrc: Oral     Resp: 20 14 22 20   Height:    5\' 2"  (1.575 m)  Weight:    56.7 kg (125 lb)  SpO2: 100% 100% 100% 96%   General: in moderate respiratory distress with accessory muscle recruitment  HEENT:       Eyes: PERRL, EOMI, no scleral icterus or conjunctival pallor.       ENT: No discharge from the ears or nose,  no pharyngeal ulcers.        Neck: No JVD, no bruit, no appreciable mass Heme: No cervical adenopathy, no pallor Cardiac: S1/S2, RRR, No murmurs, No gallops or rubs. Pulm: Good air movement bilaterally. Fine crackles at Rt base > Lt. Abd: Soft, nondistended, nontender, no rebound pain or gaurding, stoma site c/d/i without erythema or tenderness, BS present. Ext: No LE edema bilaterally. 2+DP/PT pulse bilaterally. Musculoskeletal: No gross deformity, no red, hot, swollen joints, no limitation in ROM  Skin: No rashes or wounds on exposed surfaces  Neuro: Alert, oriented X3, cranial nerves II-XII grossly intact. No focal findings Psych: Patient is not overtly psychotic, denies suicidal or homocidal ideation, no active hallucinations.  Labs on Admission:  Basic Metabolic Panel:  Recent Labs Lab 06/16/15 2233  NA 140  K 3.6  CL 104  CO2 27  GLUCOSE 104*  BUN 15  CREATININE 1.18*  CALCIUM 8.9   Liver Function Tests: No results for input(s): AST, ALT, ALKPHOS, BILITOT, PROT, ALBUMIN in the last 168 hours. No results for input(s): LIPASE, AMYLASE in the last 168 hours. No results for input(s): AMMONIA in the last 168 hours. CBC:  Recent Labs Lab 06/16/15 2233  WBC 8.0  NEUTROABS 4.5  HGB 10.8*  HCT 34.5*  MCV 81.4  PLT 203   Cardiac Enzymes:  Recent Labs Lab 06/16/15 2233  TROPONINI 0.03    BNP (last 3 results)  Recent Labs  06/16/15 2233  BNP 1250.0*    ProBNP (last 3 results) No results for input(s): PROBNP in the last 8760 hours.  CBG: No results for input(s): GLUCAP in the last 168 hours.  Radiological Exams on  Admission: Dg Chest 2 View  06/16/2015  CLINICAL DATA:  Productive cough and shortness of breath for the past 2 days. Bradycardia. EXAM: CHEST  2 VIEW COMPARISON:  05/07/2015. FINDINGS: Stable enlarged cardiac silhouette. The moderate-sized right pleural effusion and small left pleural effusion, both decreased. Decreased associated airspace opacity at both lung bases. Stable prominence of the interstitial markings. Previously noted hiatal hernia. Diffuse osteopenia. Thoracic spine and bilateral shoulder degenerative changes. Upper abdominal surgical clips. IMPRESSION: 1. Bilateral pleural effusions, decreased. 2. Decreased bibasilar atelectasis and possible pneumonia. 3. Stable cardiomegaly, chronic interstitial lung disease and hiatal hernia. Electronically Signed   By: Claudie Revering M.D.   On: 06/16/2015 23:25   Ct Chest Wo Contrast  06/17/2015  CLINICAL DATA:  Acute onset of worsening shortness of breath, productive cough and mid back pain. Initial encounter. EXAM: CT CHEST WITHOUT CONTRAST TECHNIQUE: Multidetector CT imaging of the chest was performed following the standard protocol without IV contrast. COMPARISON:  CT of the chest performed 05/01/2015 FINDINGS: Moderate right and small left pleural effusions are noted, with bibasilar airspace opacities. This may reflect atelectasis or pneumonia. The expanded portions of both lungs appear grossly clear. Mild emphysematous change is noted at the upper lung lobes. Diffuse coronary artery calcifications are seen. A moderate hiatal hernia is noted, and the patient is status post recent fundoplication. Scattered calcifications noted along the aortic arch and proximal great vessels. No pericardial effusion is identified. The visualized portions of thyroid gland are grossly unremarkable. No axillary lymphadenopathy is seen. The visualized portions of the liver and spleen are grossly unremarkable. The visualized portions of the pancreas, adrenal glands and kidneys  are within normal limits. No acute osseous abnormalities are seen. There are partially healed fractures of the left posterolateral eighth and ninth ribs, as well as partially healed fractures of  the right lateral third through eighth ribs. IMPRESSION: 1. Moderate right and small left pleural effusions, with bibasilar airspace opacities. This may reflect atelectasis or pneumonia. 2. Mild emphysematous change at the upper lung lobes. 3. Diffuse coronary artery calcifications seen. 4. Moderate hiatal hernia noted. 5. Partially healed fractures of ribs bilaterally, more prominent on the right. Electronically Signed   By: Garald Balding M.D.   On: 06/17/2015 01:37   Ct Angio Chest Pe W/cm &/or Wo Cm  06/17/2015  CLINICAL DATA:  Acute onset worsening shortness of breath, productive cough and mid back pain. Initial encounter. EXAM: CT ANGIOGRAPHY CHEST WITH CONTRAST TECHNIQUE: Multidetector CT imaging of the chest was performed using the standard protocol during bolus administration of intravenous contrast. Multiplanar CT image reconstructions and MIPs were obtained to evaluate the vascular anatomy. CONTRAST:  171mL OMNIPAQUE IOHEXOL 350 MG/ML SOLN COMPARISON:  CT the chest performed earlier today at 12:24 a.m. FINDINGS: There is no evidence of pulmonary embolus. Moderate right and small left pleural effusions are again noted, with partial consolidation of the right lower lobe. This may reflect atelectasis or pneumonia. Mild emphysematous change is noted at the lung apices. No pneumothorax is seen. No dominant masses are identified. Scattered coronary artery calcification is noted. The mediastinum is otherwise unremarkable. Scattered calcification is noted along the aortic arch and proximal great vessels. No significant pericardial effusion is seen. The great vessels are grossly unremarkable. No axillary lymphadenopathy is seen. The visualized portions of the thyroid gland are unremarkable in appearance. A moderate  hiatal hernia is noted, with changes of prior Nissen fundoplication. The visualized portions of the liver and spleen are unremarkable. No acute osseous abnormalities are seen. Bilateral partially healed rib fractures are again noted. Review of the MIP images confirms the above findings. IMPRESSION: 1. No evidence of pulmonary embolus. 2. Moderate right and small left pleural effusions again noted, with partial consolidation of the right lower lobe. This may reflect atelectasis or pneumonia. 3. Mild emphysematous change at the lung apices. 4. Scattered coronary artery calcifications noted. 5. Moderate hiatal hernia noted, with changes of prior Nissen fundoplication. 6. Bilateral partially healed rib fractures again noted. Electronically Signed   By: Garald Balding M.D.   On: 06/17/2015 01:46    EKG: Independently reviewed.  Abnormal findings:  Sinus rhythm, ventricular trigeminy  Assessment/Plan  1. Acute diastolic CHF with acute hypoxic respiratory failure  - Presents with SOB and new O2 requirement with BNP 1250  - Interestingly, there is no significant pulm edema or vascular congestion appreciated on advanced chest imaging, and no peripheral edema as she has had in the past   - She enjoyed subjective improvement in breathing with diuresis after initial Lasix dose in ED  - Will continue with IV Lasix BID while considering alternative etiologies  - SLIV, strict I/Os, daily wts, fluid-restrict diet   - Continuous pulse ox with titration of FiO2 to maintain sat >92%   2. HTN  - BP has been in 200/90 range   - Continue with IV hydralazine and Lasix  - Will likely need to add another agent  - Holding losartan while diuresing as SCr already above baseline  - Holding metoprolol with suspicion for decompensated HF   3. CKD stage III  - SCr 1.18 on arrival, just up from her apparent baseline of 0.8 - 0.9  - Anticipate may worsen initially with diuresis, but by returning her hemodynamics to the  Frank-Starling curve, diuresis may improve perfusion and SCr  -  Avoiding nephrotoxins where feasible; received contrast in ED for CTA   - Trend   4. Anemia  - Hgb 10.8 on admission, consistent with her baseline  - Attributed to chronic GI losses  - Continue iron supplementation  - No sign of active blood loss    5. CAD  - No angina or equivalent sxs  - Trop 0.03, EKG without acute ischemic findings  - Continue Crestor - Resume beta-blocker and ARB if renal fxn stable    6. Anxiety and depression  - Appears to be stable at this time  - Continue home-dose Celexa and prn Xanax  - Continue trazodone qHS prn insomnia    7. Colonic mass  - Currently undergoing workup for potential malignancy  - She has ostomy, reportedly with plans for reversal at some point   DVT ppx: SQ Heparin     Code Status: Full code Family Communication: None at bed side.               Disposition Plan: Admit to inpatient   Date of Service 06/17/2015    Vianne Bulls, MD [facility] Pager [phone]  If 7PM-7AM, please contact night-coverage [email or web address] Password [email or web address] 06/17/2015, 5:07 AM

## 2015-06-17 NOTE — Progress Notes (Signed)
*  PRELIMINARY RESULTS* Echocardiogram 2D Echocardiogram has been performed.  [name] 06/17/2015, 2:37 PM

## 2015-06-18 DIAGNOSIS — I5031 Acute diastolic (congestive) heart failure: Secondary | ICD-10-CM | POA: Diagnosis not present

## 2015-06-18 DIAGNOSIS — I130 Hypertensive heart and chronic kidney disease with heart failure and stage 1 through stage 4 chronic kidney disease, or unspecified chronic kidney disease: Secondary | ICD-10-CM | POA: Diagnosis not present

## 2015-06-18 DIAGNOSIS — I10 Essential (primary) hypertension: Secondary | ICD-10-CM | POA: Diagnosis not present

## 2015-06-18 DIAGNOSIS — N183 Chronic kidney disease, stage 3 (moderate): Secondary | ICD-10-CM | POA: Diagnosis not present

## 2015-06-18 DIAGNOSIS — D500 Iron deficiency anemia secondary to blood loss (chronic): Secondary | ICD-10-CM | POA: Diagnosis not present

## 2015-06-18 LAB — BASIC METABOLIC PANEL
Anion gap: 10 (ref 5–15)
BUN: 15 mg/dL (ref 6–20)
CALCIUM: 8.6 mg/dL — AB (ref 8.9–10.3)
CHLORIDE: 99 mmol/L — AB (ref 101–111)
CO2: 31 mmol/L (ref 22–32)
CREATININE: 1.19 mg/dL — AB (ref 0.44–1.00)
GFR calc non Af Amer: 43 mL/min — ABNORMAL LOW (ref >60)
GFR, EST AFRICAN AMERICAN: 49 mL/min — AB (ref >60)
GLUCOSE: 107 mg/dL — AB (ref 65–99)
Potassium: 2.7 mmol/L — CL (ref 3.5–5.1)
Sodium: 140 mmol/L (ref 135–145)

## 2015-06-18 MED ORDER — MAGNESIUM SULFATE 2 GM/50ML IV SOLN
2.0000 g | Freq: Once | INTRAVENOUS | Status: AC
  Administered 2015-06-18: 2 g via INTRAVENOUS
  Filled 2015-06-18: qty 50

## 2015-06-18 MED ORDER — FUROSEMIDE 40 MG PO TABS
40.0000 mg | ORAL_TABLET | Freq: Every day | ORAL | Status: DC
  Administered 2015-06-19: 40 mg via ORAL
  Filled 2015-06-18: qty 1

## 2015-06-18 MED ORDER — FUROSEMIDE 10 MG/ML IJ SOLN
40.0000 mg | Freq: Once | INTRAMUSCULAR | Status: AC
  Administered 2015-06-18: 40 mg via INTRAVENOUS
  Filled 2015-06-18: qty 4

## 2015-06-18 MED ORDER — FUROSEMIDE 40 MG PO TABS: 40.0000 mg | ORAL_TABLET | Freq: Every day | ORAL | Status: DC

## 2015-06-18 MED ORDER — POLYETHYLENE GLYCOL 3350 17 G PO PACK
17.0000 g | PACK | Freq: Every day | ORAL | Status: DC
  Filled 2015-06-18 (×2): qty 1

## 2015-06-18 MED ORDER — ISOSORB DINITRATE-HYDRALAZINE 20-37.5 MG PO TABS
2.0000 | ORAL_TABLET | Freq: Three times a day (TID) | ORAL | Status: DC
  Administered 2015-06-18 – 2015-06-19 (×3): 2 via ORAL
  Filled 2015-06-18 (×9): qty 2

## 2015-06-18 MED ORDER — ZOLPIDEM TARTRATE 5 MG PO TABS
5.0000 mg | ORAL_TABLET | Freq: Every evening | ORAL | Status: DC | PRN
  Administered 2015-06-18: 5 mg via ORAL
  Filled 2015-06-18: qty 1

## 2015-06-18 MED ORDER — POTASSIUM CHLORIDE CRYS ER 20 MEQ PO TBCR
40.0000 meq | EXTENDED_RELEASE_TABLET | Freq: Two times a day (BID) | ORAL | Status: AC
Start: 2015-06-18 — End: 2015-06-18
  Administered 2015-06-18 (×2): 40 meq via ORAL
  Filled 2015-06-18 (×2): qty 2

## 2015-06-18 NOTE — Progress Notes (Addendum)
Informed by CCMD that patient had 6 beat run VTach. Pt currently asymptomatic and denies any discomfort or pain. Pt's vitals are as follows Temperature 98.3, Pulse 85, Respirations 24, B/P 165/69, O2 94 %.   MD E-Paged.

## 2015-06-18 NOTE — Care Management Obs Status (Signed)
[facility] NOTIFICATION   Patient Details  Name: [name] MRN: [id number] Date of Birth: 1935-02-03   Medicare Observation Status Notification Given:  Yes    Briant Sites, RN 06/18/2015, 11:02 AM

## 2015-06-18 NOTE — Progress Notes (Signed)
CRITICAL VALUE ALERT  Critical value received: Potassium 2.7  Date of notification:  06/18/2015  Time of notification: 0750  Critical value read back: YES  Nurse who received alert:  Vista Deck  MD notified (1st page):  Dr. Olevia Bowens  Time of first page: 0800

## 2015-06-18 NOTE — Progress Notes (Signed)
[facility] PROGRESS NOTE    Progress Note   [name] [id number] DOB: 1934/04/20 DOA: 06/16/2015 PCP: Terald Sleeper, PA-C   Brief Narrative:   [name] is an 81 y.o. female with a complicated past medical history of paroxysmal atrial fibrillation not on anticoagulation, perforated sigmoid diverticulitis status post Hartmann pouch with a complicated intra-abdominal abscess requiring IR drainage placement, coronary artery sees heart failure, pulmonary hypertension who presents to the emergency room complaining of worsening shortness of breath that started 1 day prior to admission. She also relates productive cough. She relates she produces a thick white sputum, she denies any palpitations fever or chills. She relates no sick contacts. She was recently treated for pneumonia and she does relate achieving full recovery.   Assessment/Plan:   Acute diastolic (congestive) heart failure [facility]): Her BNP was 1250 on admission, requiring supplement oxygen. Her repeated 2-D echo showed an EF of [id number], with diastolic heart failure and elevated pulmonary pressures of 70 mmHg. Cont Beta blocker, ARB, BiDil and lasix. Change her diuretics to oral Lasix 20 mg. She is to be discharge and distalmost monitor for 24 hours repeat a basic metabolic panel in the morning. Seems to be euvolemic today.  Essenital HTN (hypertension): Blood pressure continues to be high increased BiDil.  Chronic kidney disease stage III: Going back through her records her creatinine has decreased a little bit, question if this likely due to volume overload. Creatinine has remained stable. Recheck a basic metabolic panel in the morning.  Coronary artery disease: No anginal signs, EKG without acute ischemic findings. Continue Crestor  Colonic mass: Will need follow-up as an outpatient.  Anemia due to chronic blood loss Iron deficiency anemia referred to [place], will start on oral iron. These to be follow-up as an  outpatient by [facility].  Anxiety and depression: Stable continue Celexa and Xanax. Trazodone for insomnia at bedtime.  DVT Prophylaxis - Lovenox ordered.  Family Communication: none Disposition Plan: Home in am Code Status:     Code Status Orders        Start     Ordered   06/17/15 0505  Full code   Continuous     06/17/15 0506    Code Status History    Date Active Date Inactive Code Status Order ID Comments User Context   05/02/2015 11:45 AM 05/08/2015  4:11 PM Full Code [id number]  Venetia Maxon Rama, MD Inpatient   03/05/2015  2:14 PM 03/14/2015  7:40 PM Full Code [id number]  Aviva Signs, MD Inpatient   03/03/2015  2:43 AM 03/05/2015  2:14 PM Full Code [id number]  Phillips Grout, MD Inpatient   09/26/2014 12:14 PM 09/28/2014  2:31 PM Full Code [id number]  [name], MD Inpatient   05/07/2014  9:25 PM 05/08/2014  8:12 PM Full Code [id number]  Lamar Sprinkles, MD Inpatient   09/06/2012  3:45 PM 09/12/2012  4:34 PM Full Code [id number]  Kathie Dike, MD Inpatient    Advance Directive Documentation        Most Recent Value   Type of Advance Directive  Living will   Pre-existing out of facility DNR order (yellow form or pink MOST form)     "MOST" Form in Place?          IV Access:    Peripheral IV   Procedures and diagnostic studies:   Dg Chest 2 View  06/16/2015  CLINICAL DATA:  Productive cough and shortness of breath for the past 2 days.  Bradycardia. EXAM: CHEST  2 VIEW COMPARISON:  05/07/2015. FINDINGS: Stable enlarged cardiac silhouette. The moderate-sized right pleural effusion and small left pleural effusion, both decreased. Decreased associated airspace opacity at both lung bases. Stable prominence of the interstitial markings. Previously noted hiatal hernia. Diffuse osteopenia. Thoracic spine and bilateral shoulder degenerative changes. Upper abdominal surgical clips. IMPRESSION: 1. Bilateral pleural effusions, decreased. 2. Decreased bibasilar atelectasis and  possible pneumonia. 3. Stable cardiomegaly, chronic interstitial lung disease and hiatal hernia. Electronically Signed   By: Claudie Revering M.D.   On: 06/16/2015 23:25   Ct Chest Wo Contrast  06/17/2015  CLINICAL DATA:  Acute onset of worsening shortness of breath, productive cough and mid back pain. Initial encounter. EXAM: CT CHEST WITHOUT CONTRAST TECHNIQUE: Multidetector CT imaging of the chest was performed following the standard protocol without IV contrast. COMPARISON:  CT of the chest performed 05/01/2015 FINDINGS: Moderate right and small left pleural effusions are noted, with bibasilar airspace opacities. This may reflect atelectasis or pneumonia. The expanded portions of both lungs appear grossly clear. Mild emphysematous change is noted at the upper lung lobes. Diffuse coronary artery calcifications are seen. A moderate hiatal hernia is noted, and the patient is status post recent fundoplication. Scattered calcifications noted along the aortic arch and proximal great vessels. No pericardial effusion is identified. The visualized portions of thyroid gland are grossly unremarkable. No axillary lymphadenopathy is seen. The visualized portions of the liver and spleen are grossly unremarkable. The visualized portions of the pancreas, adrenal glands and kidneys are within normal limits. No acute osseous abnormalities are seen. There are partially healed fractures of the left posterolateral eighth and ninth ribs, as well as partially healed fractures of the right lateral third through eighth ribs. IMPRESSION: 1. Moderate right and small left pleural effusions, with bibasilar airspace opacities. This may reflect atelectasis or pneumonia. 2. Mild emphysematous change at the upper lung lobes. 3. Diffuse coronary artery calcifications seen. 4. Moderate hiatal hernia noted. 5. Partially healed fractures of ribs bilaterally, more prominent on the right. Electronically Signed   By: Garald Balding M.D.   On:  06/17/2015 01:37   Ct Angio Chest Pe W/cm &/or Wo Cm  06/17/2015  CLINICAL DATA:  Acute onset worsening shortness of breath, productive cough and mid back pain. Initial encounter. EXAM: CT ANGIOGRAPHY CHEST WITH CONTRAST TECHNIQUE: Multidetector CT imaging of the chest was performed using the standard protocol during bolus administration of intravenous contrast. Multiplanar CT image reconstructions and MIPs were obtained to evaluate the vascular anatomy. CONTRAST:  184mL OMNIPAQUE IOHEXOL 350 MG/ML SOLN COMPARISON:  CT the chest performed earlier today at 12:24 a.m. FINDINGS: There is no evidence of pulmonary embolus. Moderate right and small left pleural effusions are again noted, with partial consolidation of the right lower lobe. This may reflect atelectasis or pneumonia. Mild emphysematous change is noted at the lung apices. No pneumothorax is seen. No dominant masses are identified. Scattered coronary artery calcification is noted. The mediastinum is otherwise unremarkable. Scattered calcification is noted along the aortic arch and proximal great vessels. No significant pericardial effusion is seen. The great vessels are grossly unremarkable. No axillary lymphadenopathy is seen. The visualized portions of the thyroid gland are unremarkable in appearance. A moderate hiatal hernia is noted, with changes of prior Nissen fundoplication. The visualized portions of the liver and spleen are unremarkable. No acute osseous abnormalities are seen. Bilateral partially healed rib fractures are again noted. Review of the MIP images confirms the above findings. IMPRESSION: 1. No  evidence of pulmonary embolus. 2. Moderate right and small left pleural effusions again noted, with partial consolidation of the right lower lobe. This may reflect atelectasis or pneumonia. 3. Mild emphysematous change at the lung apices. 4. Scattered coronary artery calcifications noted. 5. Moderate hiatal hernia noted, with changes of prior  Nissen fundoplication. 6. Bilateral partially healed rib fractures again noted. Electronically Signed   By: Garald Balding M.D.   On: 06/17/2015 01:46     Medical Consultants:    None.  Anti-Infectives:   Anti-infectives    None      Subjective:    [name] she relates her breathing is improved today, and eyes orthopnea.  Objective:    Filed Vitals:   06/18/15 0140 06/18/15 0622 06/18/15 0725 06/18/15 0800  BP:  168/76  165/69  Pulse:  70  85  Temp:  98.4 F (36.9 C)  98.3 F (36.8 C)  TempSrc:  Oral  Oral  Resp:  18  24  Height:      Weight:  55.067 kg (121 lb 6.4 oz)    SpO2: 98% 96% 98% 94%    Intake/Output Summary (Last 24 hours) at 06/18/15 1038 Last data filed at 06/18/15 0937  Gross per 24 hour  Intake    838 ml  Output   1300 ml  Net   -462 ml   Filed Weights   06/16/15 2159 06/17/15 0405 06/18/15 0622  Weight: 59.875 kg (132 lb) 56.7 kg (125 lb) 55.067 kg (121 lb 6.4 oz)    Exam: Gen:  NAD Cardiovascular:  RRR, negative JVD Chest and lungs:  Lungs are clear..  Abdomen:  Abdomen soft, NT/ND, + BS, ostomy bag in place no movement in the upper abdomen.  Extremities:  No edema.   Data Reviewed:    Labs: Basic Metabolic Panel:  Recent Labs Lab 06/16/15 2233 06/17/15 0515 06/18/15 0559  NA 140  --  140  K 3.6  --  2.7*  CL 104  --  99*  CO2 27  --  31  GLUCOSE 104*  --  107*  BUN 15  --  15  CREATININE 1.18*  --  1.19*  CALCIUM 8.9  --  8.6*  MG  --  1.6*  --    GFR Estimated Creatinine Clearance: 30.8 mL/min (by C-G formula based on Cr of 1.19). Liver Function Tests: No results for input(s): AST, ALT, ALKPHOS, BILITOT, PROT, ALBUMIN in the last 168 hours. No results for input(s): LIPASE, AMYLASE in the last 168 hours. No results for input(s): AMMONIA in the last 168 hours. Coagulation profile No results for input(s): INR, PROTIME in the last 168 hours.  CBC:  Recent Labs Lab 06/16/15 2233  WBC 8.0  NEUTROABS 4.5    HGB 10.8*  HCT 34.5*  MCV 81.4  PLT 203   Cardiac Enzymes:  Recent Labs Lab 06/16/15 2233  TROPONINI 0.03   BNP (last 3 results) No results for input(s): PROBNP in the last 8760 hours. CBG: No results for input(s): GLUCAP in the last 168 hours. D-Dimer: No results for input(s): DDIMER in the last 72 hours. Hgb A1c: No results for input(s): HGBA1C in the last 72 hours. Lipid Profile: No results for input(s): CHOL, HDL, LDLCALC, TRIG, CHOLHDL, LDLDIRECT in the last 72 hours. Thyroid function studies: No results for input(s): TSH, T4TOTAL, T3FREE, THYROIDAB in the last 72 hours.  Invalid input(s): FREET3 Anemia work up:  Recent Labs  06/17/15 0515 06/17/15 1010  VITAMINB12  --  336  FOLATE  --  11.7  FERRITIN  --  48  TIBC  --  302  IRON  --  28  RETICCTPCT 1.8  --    Sepsis Labs:  Recent Labs Lab 06/16/15 2233 06/17/15 0514 06/17/15 0515 06/17/15 0753  PROCALCITON  --   --  <0.10  --   WBC 8.0  --   --   --   LATICACIDVEN  --  1.3  --  1.1   Microbiology No results found for this or any previous visit (from the past 240 hour(s)).   Medications:   . albuterol  3 mL Inhalation Q6H WA  . antiseptic oral rinse  7 mL Mouth Rinse BID  . citalopram  40 mg Oral QHS  . ferrous sulfate  325 mg Oral TID  . furosemide  20 mg Intravenous BID  . heparin  5,000 Units Subcutaneous 3 times per day  . isosorbide-hydrALAZINE  1 tablet Oral TID  . losartan  100 mg Oral Daily  . metoCLOPramide  5 mg Oral TID AC  . metoprolol tartrate  12.5 mg Oral BID  . multivitamin-lutein  1 capsule Oral BID  . pantoprazole  40 mg Oral Daily  . potassium chloride  40 mEq Oral BID  . rosuvastatin  10 mg Oral QHS  . traZODone  100 mg Oral QHS  . [START ON 06/20/2015] Vitamin D (Ergocalciferol)  50,000 Units Oral Q Tue   Continuous Infusions:   Time spent: 15 min     Charlynne Cousins  [facility] Pager [phone]  *Please refer to [email or web address], password [email or web address] to get  updated schedule on who will round on this patient, as hospitalists switch teams weekly. If 7PM-7AM, please contact night-coverage at [email or web address], password [email or web address] for any overnight needs.  06/18/2015, 10:38 AM

## 2015-06-19 ENCOUNTER — Other Ambulatory Visit: Payer: Self-pay | Admitting: Internal Medicine

## 2015-06-19 DIAGNOSIS — N183 Chronic kidney disease, stage 3 (moderate): Secondary | ICD-10-CM

## 2015-06-19 DIAGNOSIS — I130 Hypertensive heart and chronic kidney disease with heart failure and stage 1 through stage 4 chronic kidney disease, or unspecified chronic kidney disease: Secondary | ICD-10-CM | POA: Diagnosis not present

## 2015-06-19 DIAGNOSIS — K6389 Other specified diseases of intestine: Secondary | ICD-10-CM

## 2015-06-19 DIAGNOSIS — I5031 Acute diastolic (congestive) heart failure: Secondary | ICD-10-CM | POA: Diagnosis not present

## 2015-06-19 DIAGNOSIS — D500 Iron deficiency anemia secondary to blood loss (chronic): Secondary | ICD-10-CM

## 2015-06-19 LAB — BASIC METABOLIC PANEL
ANION GAP: 6 (ref 5–15)
BUN: 14 mg/dL (ref 6–20)
CHLORIDE: 99 mmol/L — AB (ref 101–111)
CO2: 36 mmol/L — AB (ref 22–32)
Calcium: 8.9 mg/dL (ref 8.9–10.3)
Creatinine, Ser: 1.24 mg/dL — ABNORMAL HIGH (ref 0.44–1.00)
GFR calc Af Amer: 47 mL/min — ABNORMAL LOW (ref >60)
GFR calc non Af Amer: 41 mL/min — ABNORMAL LOW (ref >60)
GLUCOSE: 103 mg/dL — AB (ref 65–99)
POTASSIUM: 3.9 mmol/L (ref 3.5–5.1)
Sodium: 141 mmol/L (ref 135–145)

## 2015-06-19 LAB — BRAIN NATRIURETIC PEPTIDE: B Natriuretic Peptide: 217 pg/mL — ABNORMAL HIGH (ref 0.0–100.0)

## 2015-06-19 LAB — PROCALCITONIN: Procalcitonin: <0.1 ng/mL

## 2015-06-19 MED ORDER — ALBUTEROL SULFATE HFA 108 (90 BASE) MCG/ACT IN AERS: 2.0000 | INHALATION_SPRAY | RESPIRATORY_TRACT | Status: DC

## 2015-06-19 MED ORDER — POTASSIUM CHLORIDE ER 10 MEQ PO TBCR: 10.0000 meq | EXTENDED_RELEASE_TABLET | Freq: Every day | ORAL | Status: DC

## 2015-06-19 MED ORDER — ISOSORB DINITRATE-HYDRALAZINE 20-37.5 MG PO TABS: 2.0000 | ORAL_TABLET | Freq: Three times a day (TID) | ORAL | Status: DC

## 2015-06-19 MED ORDER — FUROSEMIDE 20 MG PO TABS: 20.0000 mg | ORAL_TABLET | Freq: Every day | ORAL | Status: DC

## 2015-06-19 NOTE — Care Management Note (Signed)
[facility] Management Note  Patient Details  Name: [name] MRN: [id number] Date of Birth: 10-Jun-1934  Subjective/Objective:                  Pt admitted for CHF. Pt is from home, ind with ADL's. Pt has no HH services or DME needs prior to admission. Pt plans to return home with self care today. Pt has met requirements for home O2. Pt would like DME to come from [facility].   Action/Plan: Romualdo Bolk, of [facility], made aware of referral and will obtain pt info from chart. Will deliver pt's port O2 tank to room prior to DC. No further CM needs.   Expected Discharge Date:    06/19/2015              Expected Discharge Plan:  Home/Self Care  In-House Referral:  NA  Discharge planning Services  CM Consult  Post Acute Care Choice:  Durable Medical Equipment Choice offered to:  Patient  DME Arranged:  Oxygen DME Agency:  [place]:    [facility] Agency:     Status of Service:  Completed, signed off  Medicare Important Message Given:    Date Medicare IM Given:    Medicare IM give by:    Date Additional Medicare IM Given:    Additional Medicare Important Message give by:     If discussed at [place] of Stay Meetings, dates discussed:    Additional Comments:  Sherald Barge, RN 06/19/2015, 11:49 AM

## 2015-06-19 NOTE — Progress Notes (Signed)
Patients RA sats 87% at rest, increased to 95% with 2L [place].

## 2015-06-19 NOTE — Progress Notes (Signed)
PT Cancellation Note  Patient Details Name: [name] MRN: [id number] DOB: 11/06/1934   Cancelled Treatment:    Reason Eval/Treat Not Completed: PT screened, no needs identified, will sign off. Chart reviewed, RN consulted. Holding pt evaluation at this time due to pt in process of DC. RN reports she has no concerns about pt leaving without PT evalaution. Will complete the order.    2:07 PM, 06/19/2015 Etta Grandchild, PT, DPT PRN Physical Therapist at [place] License # [id number] (wireless)  [phone] (mobile)

## 2015-06-19 NOTE — Progress Notes (Signed)
Patient discharged home.  IV removed - WNL.  Reviewed DC instructions and medications.  Educated on HF management at home.  Verbalizes understanding.  Follow up in place with PCP.  No questions at this time. Stable to DC home, assisted off unit via WC.

## 2015-06-19 NOTE — Care Management (Signed)
MD will add [facility] RN for f/u. Romualdo Bolk, of [facility], per pt's choice of agency, is aware of referral and will obtain pt info from chart. Pt is aware HH has 48 hours to initiate services.

## 2015-06-19 NOTE — Discharge Summary (Signed)
Physician Discharge Summary  Porfirio Oar Peppers [id number] DOB: Dec 10, 1934 DOA: 06/16/2015  PCP: Terald Sleeper, PA-C  Admit date: 06/16/2015 Discharge date: 06/19/2015  Time spent: >35 minutes  Recommendations for Outpatient Follow-up:  PCP in 1 week  Discharge Diagnoses:  Principal Problem:   Acute diastolic (congestive) heart failure (HCC) Active Problems:   HTN (hypertension)   Coronary artery disease   CKD (chronic kidney disease) stage 3, GFR 30-59 ml/min   Colonic mass   Anemia due to chronic blood loss   Multiple fractures of ribs, bilateral, initial encounter for closed fracture   Anxiety and depression   Pressure ulcer   Acute diastolic CHF (congestive heart failure) ([place])   Essential hypertension   Discharge Condition: stable   Diet recommendation: low sodium   Filed Weights   06/17/15 0405 06/18/15 0622 06/19/15 0620  Weight: 56.7 kg (125 lb) 55.067 kg (121 lb 6.4 oz) 54.568 kg (120 lb 4.8 oz)    History of present illness:  81 y.o. female with a complicated past medical history of HTN, h/o CHF (not on diuretics any more), paroxysmal atrial fibrillation not on anticoagulation, perforated sigmoid diverticulitis status post Hartmann pouch with a complicated intra-abdominal abscess requiring IR drainage placement, coronary artery sees heart failure, pulmonary hypertension who presents to the emergency room complaining of worsening shortness of breath that started 1 day prior to admission. She also relates productive cough. She relates she produces a thick white sputum, she denies any palpitations fever or chills. She relates no sick contacts. She was recently treated for pneumonia and she does relate achieving full recovery. Admitted with CHF exacerbation   Hospital Course:   Acute diastolic (congestive) heart failure [facility]): Her BNP was 1250 on admission, requiring supplement oxygen. Her repeated 2-D echo showed an EF of [id number], with diastolic heart failure and elevated  pulmonary pressures of 70 mmHg. -Pt responded well to diuresis, SOB-improved, then transitioned to oral diuretic regimen. Cont Beta blocker, ARB, BiDil and lasix. Seems to be euvolemic. recommended to f/u with PCP next week for labs, and reevlaution  -also ? Chronic bronchitis. CT showed mild emphysema. + h/o tobacco use. No wheezing on exam. Recommended prn bronchodilators, and PFTs as outpatient   Chronic kidney disease stage III: Going back through her records her creatinine has decreased a little bit, question if this likely due to volume overload. Creatinine has remained close to stable.  Coronary artery disease: No anginal signs, EKG without acute ischemic findings. Continue Crestor  Colonic mass: recommended to follow-up as an outpatient.  Anemia due to chronic blood loss Iron deficiency anemia referred to [place], will start on oral iron. These to be follow-up as an outpatient by [facility].  Procedures: Study Conclusions  - Left ventricle: The cavity size was normal. There was mild  concentric hypertrophy. Systolic function was vigorous. The  estimated ejection fraction was in the range of 65% to 70%. Wall  motion was normal; there were no regional wall motion  abnormalities. Doppler parameters are consistent with restrictive  left ventricular relaxation (grade 3 diastolic dysfunction). The  E/A ratio is >1.5 and the E/e&' ratio is >20, suggesting markedly  elevated LV filling pressure. - Aortic valve: Trileaflet. Sclerosis without stenosis. - Mitral valve: Calcified annulus. Mildly thickened leaflets .  There was mild regurgitation. - Left atrium: Severely dilated at 70 ml/m2. - Atrial septum: There was increased thickness of the septum,  consistent with lipomatous hypertrophy. - Tricuspid valve: There was moderate regurgitation. - Pulmonary arteries:  Dilated. PA peak pressure: 70 mm Hg (S). - Pericardium, extracardiac: A trivial pericardial effusion was   identified posterior to the heart. Features were not consistent   with tamponade physiology. There was a left pleural effusion. (i.e. Studies not automatically included, echos, thoracentesis, etc; not x-rays)  Consultations:  none  Discharge Exam: Filed Vitals:   06/18/15 2100 06/19/15 0620  BP: 170/74 186/78  Pulse: 78 64  Temp:  98.3 F (36.8 C)  Resp: 20 20    General: no distress  Cardiovascular: s1,s2 rrr Respiratory: no wheezing   Discharge Instructions  Discharge Instructions    Diet - low sodium heart healthy    Complete by:  As directed      Discharge instructions    Complete by:  As directed   Please follow up with Primary care doctor in 1 week     Increase activity slowly    Complete by:  As directed             Medication List    STOP taking these medications        hydrALAZINE 25 MG tablet  Commonly known as:  APRESOLINE      TAKE these medications        albuterol 108 (90 Base) MCG/ACT inhaler  Commonly known as:  PROAIR HFA  Inhale 2 puffs into the lungs every 4 (four) hours.     ALPRAZolam 0.25 MG tablet  Commonly known as:  XANAX  Take 1 tablet (0.25 mg total) by mouth 2 (two) times daily as needed for anxiety.     citalopram 40 MG tablet  Commonly known as:  CELEXA  Take 0.5 tablets (20 mg total) by mouth daily.     esomeprazole 40 MG capsule  Commonly known as:  NEXIUM  Take 40 mg by mouth daily before breakfast.     ferrous sulfate 325 (65 FE) MG EC tablet  Take 325 mg by mouth 3 (three) times daily.     furosemide 20 MG tablet  Commonly known as:  LASIX  Take 1 tablet (20 mg total) by mouth daily.     isosorbide-hydrALAZINE 20-37.5 MG tablet  Commonly known as:  BIDIL  Take 2 tablets by mouth 3 (three) times daily.     losartan 100 MG tablet  Commonly known as:  COZAAR  Take 1 tablet (100 mg total) by mouth daily.     metoCLOPramide 5 MG tablet  Commonly known as:  REGLAN  Take 1 tablet (5 mg total) by mouth 3  (three) times daily before meals.     metoprolol tartrate 25 MG tablet  Commonly known as:  LOPRESSOR  Take 1 tablet (25 mg total) by mouth 2 (two) times daily.     nitroGLYCERIN 0.4 MG SL tablet  Commonly known as:  NITROSTAT  Place 0.4 mg under the tongue every 5 (five) minutes as needed for chest pain. Reported on 04/26/2015     ondansetron 4 MG disintegrating tablet  Commonly known as:  ZOFRAN ODT  Take 1-2 tablets by mouth every 6-8 hours AS NEEDED. Do not exceed 6 tablets in 24 hour period.     potassium chloride 10 MEQ tablet  Commonly known as:  K-DUR  Take 1 tablet (10 mEq total) by mouth daily.     PRESERVISION AREDS Tabs  Take 1 tablet by mouth 2 (two) times daily.     rosuvastatin 10 MG tablet  Commonly known as:  CRESTOR  Take 10 mg by mouth  at bedtime.     SYSTANE BALANCE OP  Place 1-2 drops into both eyes daily as needed (dry eyes). Reported on 04/26/2015     traZODone 100 MG tablet  Commonly known as:  DESYREL  Take 100 mg by mouth at bedtime.     Vitamin D (Ergocalciferol) 50000 units Caps capsule  Commonly known as:  DRISDOL  Take 50,000 Units by mouth every Tuesday.       Allergies  Allergen Reactions  . Sulfamethoxazole Rash      The results of significant diagnostics from this hospitalization (including imaging, microbiology, ancillary and laboratory) are listed below for reference.    Significant Diagnostic Studies: Dg Chest 2 View  06/16/2015  CLINICAL DATA:  Productive cough and shortness of breath for the past 2 days. Bradycardia. EXAM: CHEST  2 VIEW COMPARISON:  05/07/2015. FINDINGS: Stable enlarged cardiac silhouette. The moderate-sized right pleural effusion and small left pleural effusion, both decreased. Decreased associated airspace opacity at both lung bases. Stable prominence of the interstitial markings. Previously noted hiatal hernia. Diffuse osteopenia. Thoracic spine and bilateral shoulder degenerative changes. Upper abdominal  surgical clips. IMPRESSION: 1. Bilateral pleural effusions, decreased. 2. Decreased bibasilar atelectasis and possible pneumonia. 3. Stable cardiomegaly, chronic interstitial lung disease and hiatal hernia. Electronically Signed   By: Claudie Revering M.D.   On: 06/16/2015 23:25   Ct Chest Wo Contrast  06/17/2015  CLINICAL DATA:  Acute onset of worsening shortness of breath, productive cough and mid back pain. Initial encounter. EXAM: CT CHEST WITHOUT CONTRAST TECHNIQUE: Multidetector CT imaging of the chest was performed following the standard protocol without IV contrast. COMPARISON:  CT of the chest performed 05/01/2015 FINDINGS: Moderate right and small left pleural effusions are noted, with bibasilar airspace opacities. This may reflect atelectasis or pneumonia. The expanded portions of both lungs appear grossly clear. Mild emphysematous change is noted at the upper lung lobes. Diffuse coronary artery calcifications are seen. A moderate hiatal hernia is noted, and the patient is status post recent fundoplication. Scattered calcifications noted along the aortic arch and proximal great vessels. No pericardial effusion is identified. The visualized portions of thyroid gland are grossly unremarkable. No axillary lymphadenopathy is seen. The visualized portions of the liver and spleen are grossly unremarkable. The visualized portions of the pancreas, adrenal glands and kidneys are within normal limits. No acute osseous abnormalities are seen. There are partially healed fractures of the left posterolateral eighth and ninth ribs, as well as partially healed fractures of the right lateral third through eighth ribs. IMPRESSION: 1. Moderate right and small left pleural effusions, with bibasilar airspace opacities. This may reflect atelectasis or pneumonia. 2. Mild emphysematous change at the upper lung lobes. 3. Diffuse coronary artery calcifications seen. 4. Moderate hiatal hernia noted. 5. Partially healed fractures of  ribs bilaterally, more prominent on the right. Electronically Signed   By: Garald Balding M.D.   On: 06/17/2015 01:37   Ct Angio Chest Pe W/cm &/or Wo Cm  06/17/2015  CLINICAL DATA:  Acute onset worsening shortness of breath, productive cough and mid back pain. Initial encounter. EXAM: CT ANGIOGRAPHY CHEST WITH CONTRAST TECHNIQUE: Multidetector CT imaging of the chest was performed using the standard protocol during bolus administration of intravenous contrast. Multiplanar CT image reconstructions and MIPs were obtained to evaluate the vascular anatomy. CONTRAST:  177mL OMNIPAQUE IOHEXOL 350 MG/ML SOLN COMPARISON:  CT the chest performed earlier today at 12:24 a.m. FINDINGS: There is no evidence of pulmonary embolus. Moderate right and small left  pleural effusions are again noted, with partial consolidation of the right lower lobe. This may reflect atelectasis or pneumonia. Mild emphysematous change is noted at the lung apices. No pneumothorax is seen. No dominant masses are identified. Scattered coronary artery calcification is noted. The mediastinum is otherwise unremarkable. Scattered calcification is noted along the aortic arch and proximal great vessels. No significant pericardial effusion is seen. The great vessels are grossly unremarkable. No axillary lymphadenopathy is seen. The visualized portions of the thyroid gland are unremarkable in appearance. A moderate hiatal hernia is noted, with changes of prior Nissen fundoplication. The visualized portions of the liver and spleen are unremarkable. No acute osseous abnormalities are seen. Bilateral partially healed rib fractures are again noted. Review of the MIP images confirms the above findings. IMPRESSION: 1. No evidence of pulmonary embolus. 2. Moderate right and small left pleural effusions again noted, with partial consolidation of the right lower lobe. This may reflect atelectasis or pneumonia. 3. Mild emphysematous change at the lung apices. 4.  Scattered coronary artery calcifications noted. 5. Moderate hiatal hernia noted, with changes of prior Nissen fundoplication. 6. Bilateral partially healed rib fractures again noted. Electronically Signed   By: Garald Balding M.D.   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 EXAM: ABSCESS INJECTION CONTRAST:  5 cc Omnipaque 300 FLUOROSCOPY TIME:  Radiation Exposure Index (as provided by the fluoroscopic device): 55 dGycm2 If the device does not provide the exposure index: Fluoroscopy Time (in minutes and seconds):  1 minutes 12 second Number of Acquired Images:  6 COMPARISON:  05/18/2015 FINDINGS: Under sterile conditions, the catheter was injected again under fluoroscopy. Images obtained for documentation. Stable drain catheter position in the pelvis. Abscess cavity remains collapsed. Previous fistula to the rectum is not visualized on today's exam. IMPRESSION: Abscess cavity remains collapse. Resolution of the previous fistula to the rectum. The drain catheter will be removed today. Electronically Signed   By: Jerilynn Mages.  Shick M.D.   On: 06/01/2015 13:46    Microbiology: No results found for this or any previous visit (from the past 240 hour(s)).   Labs: Basic Metabolic Panel:  Recent Labs Lab 06/16/15 2233 06/17/15 0515 06/18/15 0559 06/19/15 0555  NA 140  --  140 141  K 3.6  --  2.7* 3.9  CL 104  --  99* 99*  CO2 27  --  31 36*  GLUCOSE 104*  --  107* 103*  BUN 15  --  15 14  CREATININE 1.18*  --  1.19* 1.24*  CALCIUM 8.9  --  8.6* 8.9  MG  --  1.6*  --   --    Liver Function Tests: No results for input(s): AST, ALT, ALKPHOS, BILITOT, PROT, ALBUMIN in the last 168 hours. No results for input(s): LIPASE, AMYLASE in the last 168 hours. No results for input(s): AMMONIA in the last 168 hours. CBC:  Recent Labs Lab 06/16/15 2233  WBC 8.0  NEUTROABS 4.5  HGB 10.8*  HCT 34.5*  MCV 81.4  PLT 203    Cardiac Enzymes:  Recent Labs Lab 06/16/15 2233  TROPONINI 0.03   BNP: BNP (last 3 results)  Recent Labs  06/16/15 2233 06/19/15 0555  BNP 1250.0* 217.0*    ProBNP (last 3 results) No results for input(s): PROBNP in the last 8760 hours.  CBG: No results for input(s): GLUCAP in the last 168 hours.     Signed:  Rowe Clack N  [facility] 06/19/2015, 11:23 AM

## 2015-06-21 ENCOUNTER — Other Ambulatory Visit: Payer: Self-pay | Admitting: Internal Medicine

## 2015-06-29 ENCOUNTER — Telehealth: Payer: Self-pay | Admitting: Internal Medicine

## 2015-06-29 ENCOUNTER — Encounter (HOSPITAL_COMMUNITY): Payer: Self-pay

## 2015-06-29 ENCOUNTER — Emergency Department (HOSPITAL_COMMUNITY): Payer: Medicare Other

## 2015-06-29 ENCOUNTER — Emergency Department (HOSPITAL_COMMUNITY)
Admission: EM | Admit: 2015-06-29 | Discharge: 2015-06-29 | Disposition: A | Discharge status: Home / Self Care | Payer: Medicare Other | Attending: Emergency Medicine | Admitting: Emergency Medicine

## 2015-06-29 DIAGNOSIS — Z87891 Personal history of nicotine dependence: Secondary | ICD-10-CM | POA: Insufficient documentation

## 2015-06-29 DIAGNOSIS — I5030 Unspecified diastolic (congestive) heart failure: Secondary | ICD-10-CM | POA: Diagnosis not present

## 2015-06-29 DIAGNOSIS — K219 Gastro-esophageal reflux disease without esophagitis: Secondary | ICD-10-CM | POA: Insufficient documentation

## 2015-06-29 DIAGNOSIS — K2270 Barrett's esophagus without dysplasia: Secondary | ICD-10-CM | POA: Diagnosis not present

## 2015-06-29 DIAGNOSIS — F329 Major depressive disorder, single episode, unspecified: Secondary | ICD-10-CM | POA: Diagnosis not present

## 2015-06-29 DIAGNOSIS — K8020 Calculus of gallbladder without cholecystitis without obstruction: Secondary | ICD-10-CM | POA: Insufficient documentation

## 2015-06-29 DIAGNOSIS — D649 Anemia, unspecified: Secondary | ICD-10-CM | POA: Diagnosis not present

## 2015-06-29 DIAGNOSIS — I2510 Atherosclerotic heart disease of native coronary artery without angina pectoris: Secondary | ICD-10-CM | POA: Diagnosis not present

## 2015-06-29 DIAGNOSIS — I10 Essential (primary) hypertension: Secondary | ICD-10-CM | POA: Diagnosis not present

## 2015-06-29 DIAGNOSIS — Z85820 Personal history of malignant melanoma of skin: Secondary | ICD-10-CM | POA: Insufficient documentation

## 2015-06-29 DIAGNOSIS — Z79899 Other long term (current) drug therapy: Secondary | ICD-10-CM | POA: Insufficient documentation

## 2015-06-29 DIAGNOSIS — Z9889 Other specified postprocedural states: Secondary | ICD-10-CM | POA: Insufficient documentation

## 2015-06-29 DIAGNOSIS — F419 Anxiety disorder, unspecified: Secondary | ICD-10-CM | POA: Insufficient documentation

## 2015-06-29 DIAGNOSIS — Z872 Personal history of diseases of the skin and subcutaneous tissue: Secondary | ICD-10-CM | POA: Insufficient documentation

## 2015-06-29 DIAGNOSIS — E7800 Pure hypercholesterolemia, unspecified: Secondary | ICD-10-CM | POA: Diagnosis not present

## 2015-06-29 DIAGNOSIS — Z9861 Coronary angioplasty status: Secondary | ICD-10-CM | POA: Diagnosis not present

## 2015-06-29 DIAGNOSIS — R1011 Right upper quadrant pain: Secondary | ICD-10-CM | POA: Diagnosis present

## 2015-06-29 LAB — URINALYSIS, ROUTINE W REFLEX MICROSCOPIC
Bilirubin Urine: NEGATIVE
Glucose, UA: NEGATIVE mg/dL
Ketones, ur: 40 mg/dL — AB
Leukocytes, UA: NEGATIVE
NITRITE: NEGATIVE
PROTEIN: 100 mg/dL — AB
SPECIFIC GRAVITY, URINE: 1.023 (ref 1.005–1.030)
pH: 6.5 (ref 5.0–8.0)

## 2015-06-29 LAB — COMPREHENSIVE METABOLIC PANEL
ALK PHOS: 60 U/L (ref 38–126)
ALT: 13 U/L — ABNORMAL LOW (ref 14–54)
ANION GAP: 11 (ref 5–15)
AST: 19 U/L (ref 15–41)
Albumin: 3.9 g/dL (ref 3.5–5.0)
BILIRUBIN TOTAL: 0.5 mg/dL (ref 0.3–1.2)
BUN: 16 mg/dL (ref 6–20)
CALCIUM: 9.7 mg/dL (ref 8.9–10.3)
CO2: 27 mmol/L (ref 22–32)
Chloride: 103 mmol/L (ref 101–111)
Creatinine, Ser: 1.26 mg/dL — ABNORMAL HIGH (ref 0.44–1.00)
GFR, EST AFRICAN AMERICAN: 46 mL/min — AB (ref >60)
GFR, EST NON AFRICAN AMERICAN: 40 mL/min — AB (ref >60)
Glucose, Bld: 122 mg/dL — ABNORMAL HIGH (ref 65–99)
Potassium: 3.8 mmol/L (ref 3.5–5.1)
SODIUM: 141 mmol/L (ref 135–145)
TOTAL PROTEIN: 6.9 g/dL (ref 6.5–8.1)

## 2015-06-29 LAB — CBC
HCT: 32.9 % — ABNORMAL LOW (ref 36.0–46.0)
HEMOGLOBIN: 10.4 g/dL — AB (ref 12.0–15.0)
MCH: 24.8 pg — ABNORMAL LOW (ref 26.0–34.0)
MCHC: 31.6 g/dL (ref 30.0–36.0)
MCV: 78.3 fL (ref 78.0–100.0)
PLATELETS: 268 10*3/uL (ref 150–400)
RBC: 4.2 MIL/uL (ref 3.87–5.11)
RDW: 17.1 % — ABNORMAL HIGH (ref 11.5–15.5)
WBC: 6.4 10*3/uL (ref 4.0–10.5)

## 2015-06-29 LAB — LIPASE, BLOOD: Lipase: 24 U/L (ref 11–51)

## 2015-06-29 LAB — URINE MICROSCOPIC-ADD ON

## 2015-06-29 MED ORDER — SODIUM CHLORIDE 0.9 % IV BOLUS (SEPSIS)
250.0000 mL | Freq: Once | INTRAVENOUS | Status: AC
  Administered 2015-06-29: 250 mL via INTRAVENOUS

## 2015-06-29 MED ORDER — HYDROMORPHONE HCL 1 MG/ML IJ SOLN
0.5000 mg | Freq: Once | INTRAMUSCULAR | Status: AC
  Administered 2015-06-29: 0.5 mg via INTRAVENOUS
  Filled 2015-06-29: qty 1

## 2015-06-29 MED ORDER — ONDANSETRON HCL 4 MG/2ML IJ SOLN
4.0000 mg | INTRAMUSCULAR | Status: AC
  Administered 2015-06-29: 4 mg via INTRAVENOUS
  Filled 2015-06-29: qty 2

## 2015-06-29 NOTE — ED Notes (Signed)
Patient c/o right mid and right upper abdominal pain. Patient is unable to keep liquids down. Patient has a hiatal hernia and an abdominal mass. Patient as a colostomy and is having to empty it more frequent.

## 2015-06-29 NOTE — Discharge Instructions (Signed)
1. Medications: usual home medications 2. Treatment: rest, drink plenty of fluids 3. Follow Up: please followup with your primary doctor and with general surgery as scheduled for discussion of your diagnoses and further evaluation after today's visit; please return to the ER for increased pain, persistent vomiting, new or worsening symptoms   Biliary Colic Biliary colic is a pain in the upper abdomen. The pain:  Is usually felt on the right side of the abdomen, but it may also be felt in the center of the abdomen, just below the breastbone (sternum).  May spread back toward the right shoulder blade.  May be steady or irregular.  May be accompanied by nausea and vomiting. Most of the time, the pain goes away in 1-5 hours. After the most intense pain passes, the abdomen may continue to ache mildly for about 24 hours. Biliary colic is caused by a blockage in the bile duct. The bile duct is a pathway that carries bile--a liquid that helps to digest fats--from the gallbladder to the small intestine. Biliary colic usually occurs after eating, when the digestive system demands bile. The pain develops when muscle cells contract forcefully to try to move the blockage so that bile can get by. HOME CARE INSTRUCTIONS  Take medicines only as directed by your health care provider.  Drink enough fluid to keep your urine clear or pale yellow.  Avoid fatty, greasy, and fried foods. These kinds of foods increase your body's demand for bile.  Avoid any foods that make your pain worse.  Avoid overeating.  Avoid having a large meal after fasting. SEEK MEDICAL CARE IF:  You develop a fever.  Your pain gets worse.  You vomit.  You develop nausea that prevents you from eating and drinking. SEEK IMMEDIATE MEDICAL CARE IF:  You suddenly develop a fever and shaking chills.  You develop a yellowish discoloration (jaundice) of:  Skin.  Whites of the eyes.  Mucous membranes.  You have continuous  or severe pain that is not relieved with medicines.  You have nausea and vomiting that is not relieved with medicines.  You develop dizziness or you faint.   This information is not intended to replace advice given to you by your health care provider. Make sure you discuss any questions you have with your health care provider.   Document Released: 08/19/2005 Document Revised: 08/02/2014 Document Reviewed: 12/28/2013 Elsevier Interactive Patient Education [employer].

## 2015-06-29 NOTE — ED Notes (Signed)
Ultrasound at bedside

## 2015-06-29 NOTE — Telephone Encounter (Signed)
Pt is not able to keep food or liquid down. When she eats or drinks anything she is having bad abdominal pain and vomits everything she has taken in. Pt has not been able to take her medications today either. Pt has extensive history with diverticulitis that led to perforation and abdominal abscesses. Instructed pt to go to the ER at [facility] to be evaluated. Discussed with pts daughter that she should be evaluated to make sure no obstruction or new abscesses. Daughter verbalized understanding.

## 2015-06-29 NOTE — ED Provider Notes (Signed)
CSN: [id number]     Arrival date & time 06/29/15  1547 History   First MD Initiated Contact with Patient 06/29/15 1631     Chief Complaint  Patient presents with  . Emesis  . Abdominal Pain    HPI   [name] is a 81 y.o. female with a PMH of depression, anxiety, CHF, CAD, hiatal hernia, IBS, perforated bowel, atrial fibrillation with RVR who presents to the ED with RUQ abdominal pain, nausea, and vomiting, which she states started around 3 AM this morning and has been intermittent since that time. She notes aching pain with occasional sharp pain. She reports eating exacerbates her symptoms. She tried her home zofran, however, was not able to tolerate this PO. She denies fever, chills, chest pain, shortness of breath, diarrhea, constipation, dysuria, urgency, frequency. She states her colostomy has been functioning appropriately.    Past Medical History  Diagnosis Date  . Depressive disorder, not elsewhere classified   . Anxiety state, unspecified   . Pure hypercholesterolemia   . Esophageal reflux   . Infection of esophagostomy ([place])   . Congestive heart failure, unspecified     diastolic heart failure  . Pulmonary hypertension (HCC)     PA systolic pressure [id number] mmHg by echocardiogram, PA pressure 33/10 by cardiac catheterization PA saturation 62% thermodilution cardiac index 2.0 thick cardiac index 2.4  . Bradycardia     previously with coreg  . Tricuspid regurgitation     moderate tricuspid regurgitation by echocardiogram, prior use of anorexic agents  . Melanoma ([place]) 1995    removal on back   . Coronary artery disease     a. Moderate to severe coronary disease involving the left anterior descending artery and right coronary artery.  Sequential stenosis in the LAD is significant.  Right coronary artery is moderate to severely likely nonischemic. Questionable small LVOT obstruction.  No Brockenbrough maneuver was performed.  Catheterization January 2013; b. 05/2014 MV no  isch/infarct, EF 60%.  Marland Kitchen PONV (postoperative nausea and vomiting)   . Hypertension   . H/O hiatal hernia   . Gastritis   . [name]'s esophagus   . IBS (irritable bowel syndrome)   . Macular degeneration   . Dysrhythmia   . Diverticulitis   . Perforated bowel ([place])   . Anemia     post operative  . Atrial fibrillation with RVR ([place]) 09/06/2012  . Perforated sigmoid colon ([place]) 03/03/2015  . Cellulitis of extremity 09/06/2012  . Pelvic abscess in female    Past Surgical History  Procedure Laterality Date  . Nissen fundoplication  99991111  . Rotator cuff repair  ~ 2001    right  . Coronary angioplasty with stent placement  05/08/11    "1"  . Tonsillectomy  ~ 1944  . Shoulder arthroscopy  ~ 2004; 2005    left; "joint's wore out"  . Cataract extraction w/ intraocular lens  implant, bilateral  ~ 2002  . Tubal ligation  1972  . Percutaneous coronary stent intervention (pci-s) N/A 05/08/2011    Procedure: PERCUTANEOUS CORONARY STENT INTERVENTION (PCI-S);  Surgeon: Wellington Hampshire, MD;  Location: [facility] CATH LAB;  Service: Cardiovascular;  Laterality: N/A;  . Esophageal manometry N/A 06/06/2014    Procedure: ESOPHAGEAL MANOMETRY (EM);  Surgeon: Jerene Bears, MD;  Location: [facility] ENDOSCOPY;  Service: Gastroenterology;  Laterality: N/A;  . Laparoscopic nissen fundoplication N/A [id number]    Procedure: LAPAROSCOPIC  LYSIS OF ADHESIONS, SEGMENTAL GASTRECTOMY, PARTIAL REDUCTION OF HERNIA;  Surgeon: [name],  MD;  Location: [facility] ORS;  Service: General;  Laterality: N/A;  . Hernia repair    . Colectomy with colostomy creation/hartmann procedure N/A 03/05/2015    Procedure: PARTIAL COLECTOMY WITH COLOSTOMY CREATION/HARTMANN PROCEDURE;  Surgeon: Aviva Signs, MD;  Location: [facility] ORS;  Service: General;  Laterality: N/A;  . Cardiac catheterization Left 03/05/2015    Procedure: CENTRAL LINE INSERTION;  Surgeon: Aviva Signs, MD;  Location: [facility] ORS;  Service: General;  Laterality: Left;   Family History  Problem  Relation Age of Onset  . Heart attack Father   . Hypertension Father   . Diabetes Mother   . Hypertension Mother   . Colon cancer Neg Hx   . Esophageal cancer Neg Hx   . Rectal cancer Neg Hx   . Stomach cancer Neg Hx   . Skin cancer Brother     melanoma   Social History  Substance Use Topics  . Smoking status: Former Smoker -- 1.00 packs/day for 20 years    Types: Cigarettes    Start date: 10/12/1958    Quit date: 04/26/1988  . Smokeless tobacco: Never Used  . Alcohol Use: No   OB History    No data available      Review of Systems  Constitutional: Negative for fever and chills.  Respiratory: Negative for shortness of breath.   Cardiovascular: Negative for chest pain.  Gastrointestinal: Positive for nausea, vomiting and abdominal pain. Negative for diarrhea and constipation.  Genitourinary: Negative for dysuria, urgency and frequency.  All other systems reviewed and are negative.     Allergies  Sulfamethoxazole  Home Medications   Prior to Admission medications   Medication Sig Start Date End Date Taking? Authorizing Provider  albuterol (PROAIR HFA) 108 (90 Base) MCG/ACT inhaler Inhale 2 puffs into the lungs every 4 (four) hours. Patient taking differently: Inhale 2 puffs into the lungs every 4 (four) hours as needed for wheezing or shortness of breath.  06/19/15  Yes Kinnie Feil, MD  ALPRAZolam (XANAX) 0.25 MG tablet Take 1 tablet (0.25 mg total) by mouth 2 (two) times daily as needed for anxiety. 05/08/15  Yes Theodis Blaze, MD  citalopram (CELEXA) 40 MG tablet Take 0.5 tablets (20 mg total) by mouth daily. Patient taking differently: Take 20 mg by mouth at bedtime.  09/12/12  Yes Nimish Luther Parody, MD  esomeprazole (NEXIUM) 40 MG capsule Take 40 mg by mouth daily before breakfast.    Yes Historical Provider, MD  ferrous sulfate 325 (65 FE) MG EC tablet Take 325 mg by mouth 3 (three) times daily.    Yes Historical Provider, MD  furosemide (LASIX) 20 MG tablet Take  1 tablet (20 mg total) by mouth daily. 06/19/15  Yes Kinnie Feil, MD  isosorbide-hydrALAZINE (BIDIL) 20-37.5 MG tablet Take 2 tablets by mouth 3 (three) times daily. 06/19/15  Yes Kinnie Feil, MD  losartan (COZAAR) 100 MG tablet Take 1 tablet (100 mg total) by mouth daily. 11/11/12  Yes Herminio Commons, MD  metoCLOPramide (REGLAN) 5 MG tablet Take 1 tablet (5 mg total) by mouth 3 (three) times daily before meals. 06/14/15  Yes Jerene Bears, MD  metoprolol tartrate (LOPRESSOR) 25 MG tablet Take 1 tablet (25 mg total) by mouth 2 (two) times daily. 03/14/15  Yes Albertine Patricia, MD  Multiple Vitamins-Minerals (PRESERVISION AREDS) TABS Take 1 tablet by mouth 2 (two) times daily.    Yes Historical Provider, MD  nitroGLYCERIN (NITROSTAT) 0.4 MG SL tablet Place 0.4  mg under the tongue every 5 (five) minutes as needed for chest pain. Reported on 04/26/2015   Yes Historical Provider, MD  ondansetron (ZOFRAN ODT) 4 MG disintegrating tablet Take 1-2 tablets by mouth every 6-8 hours AS NEEDED. Do not exceed 6 tablets in 24 hour period. Patient taking differently: Take 4-8 mg by mouth every 6 (six) hours as needed for nausea or vomiting. Do not exceed 6 tablets in 24 hour period. 06/14/15  Yes Jerene Bears, MD  polyethylene glycol powder (GLYCOLAX/MIRALAX) powder MIX 17 GRAMS IN AT LEAST 8OZ OF WATER/JUICE AND DRINK TWICE DAILY 06/21/15  Yes Jerene Bears, MD  potassium chloride (K-DUR) 10 MEQ tablet Take 1 tablet (10 mEq total) by mouth daily. 06/19/15  Yes Kinnie Feil, MD  Propylene Glycol (SYSTANE BALANCE OP) Place 1-2 drops into both eyes daily as needed (dry eyes). Reported on 04/26/2015   Yes Historical Provider, MD  rosuvastatin (CRESTOR) 10 MG tablet Take 10 mg by mouth at bedtime.    Yes Historical Provider, MD  traZODone (DESYREL) 100 MG tablet Take 100 mg by mouth at bedtime.    Yes Historical Provider, MD  Vitamin D, Ergocalciferol, (DRISDOL) 50000 UNITS CAPS Take 50,000 Units by mouth every  Tuesday.    Yes Historical Provider, MD    BP 180/64 mmHg  Pulse 64  Temp(Src) 99.2 F (37.3 C) (Oral)  Resp 15  Ht 5\' 2"  (1.575 m)  Wt 54.885 kg  BMI 22.13 kg/m2  SpO2 93% Physical Exam  Constitutional: She is oriented to person, place, and time. She appears well-developed and well-nourished. No distress.  HENT:  Head: Normocephalic and atraumatic.  Right Ear: External ear normal.  Left Ear: External ear normal.  Nose: Nose normal.  Mouth/Throat: Uvula is midline, oropharynx is clear and moist and mucous membranes are normal.  Eyes: Conjunctivae, EOM and lids are normal. Pupils are equal, round, and reactive to light. Right eye exhibits no discharge. Left eye exhibits no discharge. No scleral icterus.  Neck: Normal range of motion. Neck supple.  Cardiovascular: Normal rate, regular rhythm, normal heart sounds, intact distal pulses and normal pulses.   Pulmonary/Chest: Effort normal and breath sounds normal. No respiratory distress. She has no wheezes. She has no rales.  Abdominal: Soft. Normal appearance and bowel sounds are normal. She exhibits no distension and no mass. There is tenderness. There is no rigidity, no rebound and no guarding.  Abdomen soft, non-distended, with TTP in RUQ and epigastrium. No rebound, guarding, or masses.  Musculoskeletal: Normal range of motion. She exhibits no edema or tenderness.  Neurological: She is alert and oriented to person, place, and time. She has normal strength. No sensory deficit.  Skin: Skin is warm, dry and intact. No rash noted. She is not diaphoretic. No erythema. No pallor.  Psychiatric: She has a normal mood and affect. Her speech is normal and behavior is normal.  Nursing note and vitals reviewed.   ED Course  Procedures (including critical care time)  Labs Review Labs Reviewed  COMPREHENSIVE METABOLIC PANEL - Abnormal; Notable for the following:    Glucose, Bld 122 (*)    Creatinine, Ser 1.26 (*)    ALT 13 (*)    GFR calc  non Af Amer 40 (*)    GFR calc Af Amer 46 (*)    All other components within normal limits  CBC - Abnormal; Notable for the following:    Hemoglobin 10.4 (*)    HCT 32.9 (*)  MCH 24.8 (*)    RDW 17.1 (*)    All other components within normal limits  URINALYSIS, ROUTINE W REFLEX MICROSCOPIC (NOT AT [facility]) - Abnormal; Notable for the following:    APPearance CLOUDY (*)    Hgb urine dipstick LARGE (*)    Ketones, ur 40 (*)    Protein, ur 100 (*)    All other components within normal limits  URINE MICROSCOPIC-ADD ON - Abnormal; Notable for the following:    Squamous Epithelial / LPF 0-5 (*)    Bacteria, UA RARE (*)    All other components within normal limits  LIPASE, BLOOD    Imaging Review US Abdomen Limited  06/29/2015  CLINICAL DATA:  Right upper quadrant abdominal pain. EXAM: US ABDOMEN LIMITED - RIGHT UPPER QUADRANT COMPARISON:  05/18/2015 CT abdomen/ pelvis. FINDINGS: Gallbladder: There are a few tiny layering shadowing gallstones measuring up to 6 mm in size in the gallbladder. No gallbladder wall thickening, pericholecystic fluid or sonographic Murphy's sign. Common bile duct: Diameter: 6 mm Liver: No focal lesion identified. Within normal limits in parenchymal echogenicity. Incidentally noted simple 2.4 cm renal cyst in the upper right kidney. Incidentally noted right pleural effusion. IMPRESSION: 1. Cholelithiasis.  No sonographic evidence of acute cholecystitis. 2. Common bile duct diameter 6 mm, within normal limits for age. 3. Normal liver. 4. Incidental right pleural effusion and simple right renal cyst. Electronically Signed   By: Ilona Sorrel M.D.   On: 06/29/2015 20:04   I have personally reviewed and evaluated these images and lab results as part of my medical decision-making.   EKG Interpretation   Date/Time:  Thursday June 29 2015 17:23:36 EDT Ventricular Rate:  61 PR Interval:  123 QRS Duration: 101 QT Interval:  479 QTC Calculation: 482 R Axis:   39 Text  Interpretation:  Sinus rhythm Atrial premature complex No significant  change since last tracing Confirmed by Christy Gentles  MD, Elenore Rota ([id number]) on  06/29/2015 5:37:15 PM      MDM   Final diagnoses:  Calculus of gallbladder without cholecystitis without obstruction    81 year old female with extensive PMH presents with abdominal pain, nausea, and vomiting, which started this morning prior to arrival. Denies fever, chills, chest pain, shortness of breath, diarrhea, constipation, dysuria, urgency, frequency. Patient is afebrile. Hypertensive. Abdomen soft, non-distended, with TTP in RUQ and epigastrium. No rebound, guarding, or masses.   Will give pain medicine, antiemetic, and 250 cc NS.  Patient discussed with Dr. Christy Gentles.  CBC negative for leukocytosis, hemoglobin 10.4, which appears stable from prior. CMP remarkable for creatinine 1.26. Lipase within normal limits. UA with hemoglobin and TNTC RBCs. No evidence of infection. RUQ [place] remarkable for cholelithiasis, no sonographic evidence of acute cholecystitis. Discussed findings with patient. On reassessment, she reports significant symptom improvement. Minimal TTP in RUQ on repeat abdominal exam. No peritoneal signs. Patient is non-toxic and well-appearing, feel she is stable for discharge at this time. Patient has zofran and pain medication at home. Patient to follow-up with general surgery as scheduled. Strict return precautions discussed. Patient verbalizes her understanding and is in agreement with plan.  BP 180/64 mmHg  Pulse 64  Temp(Src) 99.2 F (37.3 C) (Oral)  Resp 15  Ht 5\' 2"  (1.575 m)  Wt 54.885 kg  BMI 22.13 kg/m2  SpO2 93%     Marella Chimes, PA-C 06/30/15 0017  Ripley Fraise, MD 07/01/15 [phone]

## 2015-06-29 NOTE — ED Notes (Signed)
Below order not completed by EW. 

## 2015-07-07 ENCOUNTER — Ambulatory Visit (INDEPENDENT_AMBULATORY_CARE_PROVIDER_SITE_OTHER): Payer: Medicare Other | Admitting: Cardiovascular Disease

## 2015-07-07 ENCOUNTER — Encounter: Payer: Self-pay | Admitting: Cardiovascular Disease

## 2015-07-07 VITALS — BP 142/62 | HR 57 | Ht 62.0 in | Wt 121.0 lb

## 2015-07-07 DIAGNOSIS — Z5181 Encounter for therapeutic drug level monitoring: Secondary | ICD-10-CM

## 2015-07-07 DIAGNOSIS — I5032 Chronic diastolic (congestive) heart failure: Secondary | ICD-10-CM | POA: Diagnosis not present

## 2015-07-07 DIAGNOSIS — I10 Essential (primary) hypertension: Secondary | ICD-10-CM

## 2015-07-07 DIAGNOSIS — I480 Paroxysmal atrial fibrillation: Secondary | ICD-10-CM

## 2015-07-07 DIAGNOSIS — Z7901 Long term (current) use of anticoagulants: Secondary | ICD-10-CM

## 2015-07-07 DIAGNOSIS — I272 Other secondary pulmonary hypertension: Secondary | ICD-10-CM

## 2015-07-07 DIAGNOSIS — Z9889 Other specified postprocedural states: Secondary | ICD-10-CM

## 2015-07-07 DIAGNOSIS — I2510 Atherosclerotic heart disease of native coronary artery without angina pectoris: Secondary | ICD-10-CM

## 2015-07-07 DIAGNOSIS — R001 Bradycardia, unspecified: Secondary | ICD-10-CM

## 2015-07-07 DIAGNOSIS — Z87898 Personal history of other specified conditions: Secondary | ICD-10-CM

## 2015-07-07 DIAGNOSIS — Z9289 Personal history of other medical treatment: Secondary | ICD-10-CM

## 2015-07-07 DIAGNOSIS — Z9861 Coronary angioplasty status: Secondary | ICD-10-CM

## 2015-07-07 NOTE — Progress Notes (Signed)
Patient ID: [name], female   DOB: [birth year], 81 y.o.   MRN: [id number]      SUBJECTIVE: The patient returns for posthospitalization follow-up. She was hospitalized this past March for acute on chronic diastolic heart failure. Echocardiogram on 06/17/15 demonstrated vigorous left ventricular systolic function, EF [id number], grade 3 diastolic dysfunction, elevated filling pressures, and severe pulmonary hypertension, 70 mmHg.  She was hospitalized for perforated bowel in December 2016. She reportedly developed atrial fibrillation recurrence during that hospitalization and anticoagulation was recommended. However, she is no longer on it. Hemoglobin 06/28/1708.4, platelets 268. Hemoglobin had been as low as 8.1 on 05/08/15.  Upon review of the discharge summary dated 05/08/15, anticoagulation was on hold secondary to a drop in hemoglobin and a high risk of falls. She was also determined to be a high risk for rebleeding with no plan to resume anticoagulation.  She denies chest pain, palpitations, bleeding problems, and shortness of breath as well as leg swelling. She is gradually regaining her strength.  Review of Systems: As per "subjective", otherwise negative.  Allergies  Allergen Reactions  . Sulfamethoxazole Rash    Current Outpatient Prescriptions  Medication Sig Dispense Refill  . albuterol (PROAIR HFA) 108 (90 Base) MCG/ACT inhaler Inhale 2 puffs into the lungs every 4 (four) hours. (Patient taking differently: Inhale 2 puffs into the lungs every 4 (four) hours as needed for wheezing or shortness of breath. ) 1 Inhaler 2  . ALPRAZolam (XANAX) 0.25 MG tablet Take 1 tablet (0.25 mg total) by mouth 2 (two) times daily as needed for anxiety. 60 tablet 0  . citalopram (CELEXA) 40 MG tablet Take 0.5 tablets (20 mg total) by mouth daily. (Patient taking differently: Take 20 mg by mouth at bedtime. )    . esomeprazole (NEXIUM) 40 MG capsule Take 40 mg by mouth daily before breakfast.     . ferrous  sulfate 325 (65 FE) MG EC tablet Take 325 mg by mouth 3 (three) times daily.     . furosemide (LASIX) 20 MG tablet Take 1 tablet (20 mg total) by mouth daily. 30 tablet 0  . isosorbide-hydrALAZINE (BIDIL) 20-37.5 MG tablet Take 2 tablets by mouth 3 (three) times daily. 60 tablet 1  . losartan (COZAAR) 100 MG tablet Take 1 tablet (100 mg total) by mouth daily. 30 tablet 6  . metoCLOPramide (REGLAN) 5 MG tablet Take 1 tablet (5 mg total) by mouth 3 (three) times daily before meals. 90 tablet 2  . metoprolol tartrate (LOPRESSOR) 25 MG tablet Take 1 tablet (25 mg total) by mouth 2 (two) times daily.    . Multiple Vitamins-Minerals (PRESERVISION AREDS) TABS Take 1 tablet by mouth 2 (two) times daily.     . nitroGLYCERIN (NITROSTAT) 0.4 MG SL tablet Place 0.4 mg under the tongue every 5 (five) minutes as needed for chest pain. Reported on 04/26/2015    . ondansetron (ZOFRAN ODT) 4 MG disintegrating tablet Take 1-2 tablets by mouth every 6-8 hours AS NEEDED. Do not exceed 6 tablets in 24 hour period. (Patient taking differently: Take 4-8 mg by mouth every 6 (six) hours as needed for nausea or vomiting. Do not exceed 6 tablets in 24 hour period.) 60 tablet 2  . polyethylene glycol powder (GLYCOLAX/MIRALAX) powder MIX 17 GRAMS IN AT LEAST 8OZ OF WATER/JUICE AND DRINK TWICE DAILY 1054 g 0  . potassium chloride (K-DUR) 10 MEQ tablet Take 1 tablet (10 mEq total) by mouth daily. 30 tablet 0  . Propylene Glycol (SYSTANE BALANCE  OP) Place 1-2 drops into both eyes daily as needed (dry eyes). Reported on 04/26/2015    . rosuvastatin (CRESTOR) 10 MG tablet Take 10 mg by mouth at bedtime.     . traZODone (DESYREL) 100 MG tablet Take 100 mg by mouth at bedtime.     . Vitamin D, Ergocalciferol, (DRISDOL) 50000 UNITS CAPS Take 50,000 Units by mouth every Tuesday.      No current facility-administered medications for this visit.    Past Medical History  Diagnosis Date  . Depressive disorder, not elsewhere classified     . Anxiety state, unspecified   . Pure hypercholesterolemia   . Esophageal reflux   . Infection of esophagostomy ([place])   . Congestive heart failure, unspecified     diastolic heart failure  . Pulmonary hypertension (HCC)     PA systolic pressure [id number] mmHg by echocardiogram, PA pressure 33/10 by cardiac catheterization PA saturation 62% thermodilution cardiac index 2.0 thick cardiac index 2.4  . Bradycardia     previously with coreg  . Tricuspid regurgitation     moderate tricuspid regurgitation by echocardiogram, prior use of anorexic agents  . Melanoma ([place]) 1995    removal on back   . Coronary artery disease     a. Moderate to severe coronary disease involving the left anterior descending artery and right coronary artery.  Sequential stenosis in the LAD is significant.  Right coronary artery is moderate to severely likely nonischemic. Questionable small LVOT obstruction.  No Brockenbrough maneuver was performed.  Catheterization January 2013; b. 05/2014 MV no isch/infarct, EF 60%.  Marland Kitchen PONV (postoperative nausea and vomiting)   . Hypertension   . H/O hiatal hernia   . Gastritis   . [name]'s esophagus   . IBS (irritable bowel syndrome)   . Macular degeneration   . Dysrhythmia   . Diverticulitis   . Perforated bowel ([place])   . Anemia     post operative  . Atrial fibrillation with RVR ([place]) 09/06/2012  . Perforated sigmoid colon ([place]) 03/03/2015  . Cellulitis of extremity 09/06/2012  . Pelvic abscess in female     Past Surgical History  Procedure Laterality Date  . Nissen fundoplication  99991111  . Rotator cuff repair  ~ 2001    right  . Coronary angioplasty with stent placement  05/08/11    "1"  . Tonsillectomy  ~ 1944  . Shoulder arthroscopy  ~ 2004; 2005    left; "joint's wore out"  . Cataract extraction w/ intraocular lens  implant, bilateral  ~ 2002  . Tubal ligation  1972  . Percutaneous coronary stent intervention (pci-s) N/A 05/08/2011    Procedure: PERCUTANEOUS CORONARY  STENT INTERVENTION (PCI-S);  Surgeon: Wellington Hampshire, MD;  Location: [facility] CATH LAB;  Service: Cardiovascular;  Laterality: N/A;  . Esophageal manometry N/A 06/06/2014    Procedure: ESOPHAGEAL MANOMETRY (EM);  Surgeon: Jerene Bears, MD;  [facility] ENDOSCOPY;  Service: Gastroenterology;  Laterality: N/A;  . Laparoscopic nissen fundoplication N/A [id number]    Procedure: LAPAROSCOPIC  LYSIS OF ADHESIONS, SEGMENTAL GASTRECTOMY, PARTIAL REDUCTION OF HERNIA;  Surgeon: [name], MD;  Location: [facility] ORS;  Service: General;  Laterality: N/A;  . Hernia repair    . Colectomy with colostomy creation/hartmann procedure N/A 03/05/2015    Procedure: PARTIAL COLECTOMY WITH COLOSTOMY CREATION/HARTMANN PROCEDURE;  Surgeon: Aviva Signs, MD;  Location: [facility] ORS;  Service: General;  Laterality: N/A;  . Cardiac catheterization Left 03/05/2015    Procedure: CENTRAL LINE INSERTION;  Surgeon: Elta Guadeloupe  Arnoldo Morale, MD;  Location: [facility] ORS;  Service: General;  Laterality: Left;    Social History   Social History  . Marital Status: Divorced    Spouse Name: N/A  . Number of Children: 3  . Years of Education: N/A   Occupational History  . Retired    Social History Main Topics  . Smoking status: Former Smoker -- 1.00 packs/day for 20 years    Types: Cigarettes    Start date: 10/12/1958    Quit date: 04/26/1988  . Smokeless tobacco: Never Used  . Alcohol Use: No  . Drug Use: No  . Sexual Activity: No   Other Topics Concern  . Not on file   Social History Narrative   Divorced.  Lives alone.  Ambulates with a walker.      Filed Vitals:   07/07/15 1414  BP: 142/62  Pulse: 57  Height: 5\' 2"  (1.575 m)  Weight: 121 lb (54.885 kg)  SpO2: 97%    PHYSICAL EXAM General: NAD HEENT: Normal. Neck: No JVD, no thyromegaly. Lungs: Clear to auscultation bilaterally with normal respiratory effort. CV: Bradycardic, regular rhythm, normal S1/S2, no S3/S4, no murmur. No pretibial or periankle edema.   Abdomen: Soft, no  distention.  Neurologic: Alert and oriented.  Psych: Normal affect. Skin: Normal. Musculoskeletal: No gross deformities.  ECG: Most recent ECG reviewed.      ASSESSMENT AND PLAN: 1. CAD s/p LAD stent: Stable ischemic heart disease. Continue statin and Bidil. No longer on ASA presumably due to reasons mentioned above (high bleeding risk, anemic). Back on metoprolol apparently.   2. Essential HTN: Reasonably controlled for age. No changes. No longer on amlodipine.  3. Symptomatic bradycardia/Paroxysmal atrial fibrillation: Back on beta blockers. I started Eliquis last year. No longer on anticoagulation for reasons mentioned above (high bleeding risk, anemic, falls risk). Will refer to  Dr. Rayann Heman for Watchman device consideration given elevated thromboembolic risk.  4. Chronic diastolic heart failure: Symptomatically stable on current dose of Lasix.  Dispo: f/u  6 months.  Time spent: 40 minutes, of which greater than 50% was spent reviewing symptoms, relevant blood tests and studies, and discussing management plan with the patient.   Kate Sable, M.D., F.A.C.C.

## 2015-07-07 NOTE — Addendum Note (Signed)
Addended by: Julian Hy T on: 07/07/2015 02:57 PM   Modules accepted: Orders

## 2015-07-07 NOTE — Patient Instructions (Signed)
Your physician wants you to follow-up in: 6 months with Dr. Virgina Jock will receive a reminder letter in the mail two months in advance. If you don't receive a letter, please call our office to schedule the follow-up appointment.  Your physician recommends that you continue on your current medications as directed. Please refer to the Current Medication list given to you today.  You have been referred to Dr. Rayann Heman  Thank you for choosing [facility]!!

## 2015-07-12 ENCOUNTER — Encounter: Payer: Self-pay | Admitting: General Surgery

## 2015-07-12 NOTE — Progress Notes (Signed)
[name] 07/12/2015 11:08 AM Location: [facility] Patient #: [id number] DOB: [birth year] Divorced / Language: [name] / Race: White Female  History of Present Illness [name]; 07/12/2015 12:46 PM) The patient is a 81 year old female.   Note:She is here for a follow-up visit for multiple reasons. She was discharged from the hospital June 19, 2015. She is hospitalized because of acute diastolic congestive heart failure. She also has severe pulmonary hypertension. She has a tubulovillous adenoma of the colon and we were trying to get her stronger or she could tolerate the surgery. About 2 weeks ago, she presented to emergency department was right upper quadrant pain and was found to have symptomatic cholelithiasis. She is going to be referred to Dr. Rayann Heman for a procedure because of her elevated thromboembolic risk and her ability to take anticoagulation due to the slowly bleeding tubular villous adenoma. She has dry mouth. She is feeling a little stronger. Her daughters are here with her.  Allergies Elbert Ewings, [place]; 07/12/2015 11:08 AM) Sulfa Antibiotics  Medication History Elbert Ewings, CMA; 07/12/2015 11:09 AM) Citalopram Hydrobromide (40MG  Tablet, Oral) Active. Aspirin EC (81MG  Tablet DR, Oral) Active. Ferrous Sulfate (325 (65 Fe)MG Tablet, Oral) Active. NexIUM (40MG  Capsule DR, Oral) Active. Losartan Potassium (100MG  Tablet, Oral) Active. Systane Balance (0.6% Solution, Ophthalmic as needed) Active. TraZODone HCl (100MG  Tablet, Oral) Active. Vitamin D3 (5000UNIT Tablet Chewable, Oral) Active. Zolpidem Tartrate (10MG  Tablet, Oral) Active. HydroCHLOROthiazide (25MG  Tablet, Oral) Active. CeleXA (20MG  Tablet, Oral) Active. Guaifenesin (600MG  Tablet ER, Oral) Active. HydrALAZINE HCl (25MG  Tablet, Oral) Active. Metoprolol Succinate ER (25MG  Tablet ER 24HR, Oral) Active. Nitroglycerin (0.4MG  Tab Sublingual, Sublingual)  Active. Oxycodone-Acetaminophen (5-325MG  Tablet, Oral) Active. ProAir HFA (108 (90 Base)MCG/ACT Aerosol Soln, Inhalation) Active. Rosuvastatin Calcium (10MG  Tablet, Oral) Active. Albuterol Sulfate ER (8MG  Tablet ER 12HR, Oral) Active. Fosamax (70MG  Tablet, Oral) Active. AmLODIPine Besylate (10MG  Tablet, Oral) Active. Claritin (10MG  Tablet, Oral) Active. Multivitamins (Oral) Active. Medications Reconciled Xanax (0.25MG  Tablet, Oral) Active.    Vitals Elbert Ewings CMA; 07/12/2015 11:09 AM) 07/12/2015 11:09 AM Weight: 157 lb Height: 62in Body Surface Area: 1.72 m Body Mass Index: 28.72 kg/m  Temp.: 97.81F  Pulse: 71 (Regular)  BP: 126/70 (Sitting, Left Arm, Standard)      Physical Exam [name]; 07/12/2015 12:47 PM)  The physical exam findings are as follows: Note:General-cachectic-appearing female in no acute distress. She does hear to be little stronger and last time.  HEENT-dry lips and mucous membranes  Lungs-clear to auscultation.  Abdomen soft, colostomy looked upper quadrant, long midline scar, no tenderness.    Assessment & Plan [name]; 07/12/2015 12:49 PM)  TUBULOVILLOUS ADENOMA OF COLON (D12.6) Impression: Resection is still recommended which would likely include the rest of her colon and then an ileum to rectal anastomosis. We talked about increased frequency of bowel movements or needing a permanent ostomy if were not able to safely perform an anastomosis. I have explained the procedure and risks of colon resection. Risks include but are not limited to bleeding, infection, wound problems, anesthesia, anastomotic leak, need for colostomy, need for reoperative surgery, injury to intraabominal organs (such as bile duct, intestine, spleen, kidney, bladder, ureter, etc.), ileus, irregular bowel habits. Prior to this, she needs to be seen and have her cardiac procedure. After her procedure, as long as the cardiologist  feels safe to proceed with this. Half-hour complex surgery involving a partial colectomy and cholecystectomy, we can work on scheduling that when  Dr. Rayann Heman feels it is safe to do so.  [name], MD

## 2015-08-02 ENCOUNTER — Ambulatory Visit: Payer: Self-pay | Admitting: General Surgery

## 2015-08-02 ENCOUNTER — Encounter: Payer: Self-pay | Admitting: Nurse Practitioner

## 2015-08-02 ENCOUNTER — Encounter: Payer: Self-pay | Admitting: General Surgery

## 2015-08-02 ENCOUNTER — Telehealth: Payer: Self-pay | Admitting: Cardiovascular Disease

## 2015-08-02 ENCOUNTER — Telehealth: Payer: Self-pay | Admitting: Internal Medicine

## 2015-08-02 ENCOUNTER — Ambulatory Visit (INDEPENDENT_AMBULATORY_CARE_PROVIDER_SITE_OTHER): Payer: Medicare Other | Admitting: Internal Medicine

## 2015-08-02 VITALS — BP 164/92 | HR 59 | Ht 62.0 in | Wt 111.4 lb

## 2015-08-02 DIAGNOSIS — I2510 Atherosclerotic heart disease of native coronary artery without angina pectoris: Secondary | ICD-10-CM

## 2015-08-02 DIAGNOSIS — R0602 Shortness of breath: Secondary | ICD-10-CM

## 2015-08-02 DIAGNOSIS — I480 Paroxysmal atrial fibrillation: Secondary | ICD-10-CM | POA: Diagnosis not present

## 2015-08-02 DIAGNOSIS — I10 Essential (primary) hypertension: Secondary | ICD-10-CM

## 2015-08-02 MED ORDER — ONDANSETRON 4 MG PO TBDP: 4.0000 mg | ORAL_TABLET | Freq: Three times a day (TID) | ORAL | Status: DC | PRN

## 2015-08-02 NOTE — Progress Notes (Unsigned)
I spoke with her today.  She continues to slowly decline due to chronic nausea with intermittent vomiting which is felt to be from her gallbladder disease.  I have spoken with Dr. Bronson Ing and reviewed Dr. Jackalyn Lombard note.  She is at higher risk for perioperative morbidity and mortality.  At this time, I feel partial colectomy and cholecystectomy, possible gastrostomy is the best option for her.  I feel doing the colostomy closure at the same time may be too much for her unless her nutritional status is normal and she is significantly stronger by the time of surgery.  She agrees with this.  Will work on scheduling her operation.  If her prealbumin, which will be ordered and done soon, shows significant malnutrition, she would be to be on TPN for 7-10 before the surgery to reverse her malnutrition trend.

## 2015-08-02 NOTE — Telephone Encounter (Signed)
I was called by Dr. Zella Richer to discuss this patient's upcoming surgery, medication optimization, and perioperative risk. I agree with Dr. Rayann Heman that she is at the very least a moderate risk for major adverse cardiac events, given her chronic diastolic heart failure with grade III diastolic dysfunction (restrictive physiology) with consequent pulmonary hypertension, coronary artery disease, and atrial fibrillation.  However, it is clear the surgery needs to be pursued as this patient has had a gradual decline. At the present time, she is on optimal medical therapy. I suggest judicious administration of IV fluids during the intraoperative period so as not to precipitate acute diastolic heart failure and optimal BP control as well.

## 2015-08-02 NOTE — Telephone Encounter (Signed)
Pt having trouble keeping zofran tablet down. Discussed with pt that we can send in script for ODT. Pt would like to try this. Script sent to pharmacy.

## 2015-08-02 NOTE — Progress Notes (Signed)
Electrophysiology Office Note   Date:  08/02/2015   ID:  [name], DOB 10-05-1934, MRN [id number]  PCP:  Terald Sleeper, PA-C  Cardiologist:  Dr Bronson Ing Primary Electrophysiologist: Thompson Grayer, MD    Chief Complaint  Patient presents with  . Atrial Fibrillation     History of Present Illness: [name] is a 81 y.o. female who presents today for electrophysiology evaluation.   The patient has a h/o afib.  She was previously started on eliquis by Dr Bronson Ing. She has chronic anemia and unsteadiness.  Because of anemia, her eliquis was discontinued.  She is also off of ASA at this time. She was referred to me for consideration of Watchman left atrial appendage occlusion.  Unfortunately, she has since been found to have an abdominal mass.  Surgery is being considered as she has nause, poor intake, and significant weight loss.  She states that Dr Zella Richer would like for cardiac clearance prior to partial colectomy/ cholecystectomy. She seems to be doing well from a CV standpoint.  Myoview 05/2014 and recent echo are reviewed. She has chronic SOB but no recent increases in limitations.  Today, she denies symptoms of palpitations, chest pain, orthopnea, PND, lower extremity edema, claudication, dizziness, presyncope, syncope, or neurologic sequela. The patient is tolerating medications without difficulties and is otherwise without complaint today.    Past Medical History  Diagnosis Date  . Depressive disorder, not elsewhere classified   . Anxiety state, unspecified   . Pure hypercholesterolemia   . Esophageal reflux   . Infection of esophagostomy ([place])   . Congestive heart failure, unspecified     diastolic heart failure  . Pulmonary hypertension (HCC)     PA systolic pressure [id number] mmHg by echocardiogram, PA pressure 33/10 by cardiac catheterization PA saturation 62% thermodilution cardiac index 2.0 thick cardiac index 2.4  . Tricuspid regurgitation     moderate  tricuspid regurgitation by echocardiogram, prior use of anorexic agents  . Melanoma ([place])     removal on back   . Coronary artery disease     a. Moderate to severe coronary disease involving the left anterior descending artery and right coronary artery.  Sequential stenosis in the LAD is significant.  Right coronary artery is moderate to severely likely nonischemic. Questionable small LVOT obstruction.  No Brockenbrough maneuver was performed.  Catheterization January 2013; b. 05/2014 MV no isch/infarct, EF 60%.  . Hypertension   . H/O hiatal hernia   . Gastritis   . [name]'s esophagus   . IBS (irritable bowel syndrome)   . Macular degeneration   . Diverticulitis   . Perforated bowel ([place])   . Anemia   . Atrial fibrillation with RVR ([place])   . Perforated sigmoid colon ([place])   . Cellulitis of extremity   . Pelvic abscess in female    Past Surgical History  Procedure Laterality Date  . Nissen fundoplication  99991111  . Rotator cuff repair  ~ 2001    right  . Coronary angioplasty with stent placement  05/08/11    "1"  . Tonsillectomy  ~ 1944  . Shoulder arthroscopy  ~ 2004; 2005    left; "joint's wore out"  . Cataract extraction w/ intraocular lens  implant, bilateral  ~ 2002  . Tubal ligation  1972  . Percutaneous coronary stent intervention (pci-s) N/A 05/08/2011    Procedure: PERCUTANEOUS CORONARY STENT INTERVENTION (PCI-S);  Surgeon: Wellington Hampshire, MD;  Location: [facility] CATH LAB;  Service: Cardiovascular;  Laterality:  N/A;  . Esophageal manometry N/A 06/06/2014    Procedure: ESOPHAGEAL MANOMETRY (EM);  Surgeon: Jerene Bears, MD;  Location: [facility] ENDOSCOPY;  Service: Gastroenterology;  Laterality: N/A;  . Laparoscopic nissen fundoplication N/A [id number]    Procedure: LAPAROSCOPIC  LYSIS OF ADHESIONS, SEGMENTAL GASTRECTOMY, PARTIAL REDUCTION OF HERNIA;  Surgeon: [name], MD;  Location: [facility] ORS;  Service: General;  Laterality: N/A;  . Hernia repair    . Colectomy with colostomy  creation/hartmann procedure N/A 03/05/2015    Procedure: PARTIAL COLECTOMY WITH COLOSTOMY CREATION/HARTMANN PROCEDURE;  Surgeon: Aviva Signs, MD;  Location: [facility] ORS;  Service: General;  Laterality: N/A;  . Cardiac catheterization Left 03/05/2015    Procedure: CENTRAL LINE INSERTION;  Surgeon: Aviva Signs, MD;  Location: [facility] ORS;  Service: General;  Laterality: Left;     Current Outpatient Prescriptions  Medication Sig Dispense Refill  . albuterol (PROVENTIL HFA;VENTOLIN HFA) 108 (90 Base) MCG/ACT inhaler Inhale 2 puffs into the lungs as directed.    Marland Kitchen ALPRAZolam (XANAX) 0.25 MG tablet Take 1 tablet (0.25 mg total) by mouth 2 (two) times daily as needed for anxiety. 60 tablet 0  . citalopram (CELEXA) 40 MG tablet Take 40 mg by mouth at bedtime. Pt takes 20 mg daily at bedtime    . esomeprazole (NEXIUM) 40 MG capsule Take 40 mg by mouth daily before breakfast.     . ferrous sulfate 325 (65 FE) MG EC tablet Take 325 mg by mouth 3 (three) times daily.     . furosemide (LASIX) 20 MG tablet Take 1 tablet (20 mg total) by mouth daily. 30 tablet 0  . isosorbide-hydrALAZINE (BIDIL) 20-37.5 MG tablet Take 2 tablets by mouth 3 (three) times daily. 60 tablet 1  . losartan (COZAAR) 100 MG tablet Take 1 tablet (100 mg total) by mouth daily. 30 tablet 6  . metoCLOPramide (REGLAN) 5 MG tablet Take 1 tablet (5 mg total) by mouth 3 (three) times daily before meals. 90 tablet 2  . metoprolol tartrate (LOPRESSOR) 25 MG tablet Take 1 tablet (25 mg total) by mouth 2 (two) times daily.    . Multiple Vitamins-Minerals (PRESERVISION AREDS) TABS Take 1 tablet by mouth 2 (two) times daily.     . nitroGLYCERIN (NITROSTAT) 0.4 MG SL tablet Place 0.4 mg under the tongue every 5 (five) minutes as needed for chest pain. Reported on 04/26/2015    . ondansetron (ZOFRAN-ODT) 4 MG disintegrating tablet Take 4 mg by mouth as directed.    . polyethylene glycol powder (GLYCOLAX/MIRALAX) powder MIX 17 GRAMS IN AT LEAST 8OZ OF  WATER/JUICE AND DRINK TWICE DAILY 1054 g 0  . potassium chloride (K-DUR) 10 MEQ tablet Take 1 tablet (10 mEq total) by mouth daily. 30 tablet 0  . Propylene Glycol (SYSTANE BALANCE OP) Place 1-2 drops into both eyes daily as needed (dry eyes). Reported on 04/26/2015    . rosuvastatin (CRESTOR) 10 MG tablet Take 10 mg by mouth at bedtime.     . traZODone (DESYREL) 100 MG tablet Take 100 mg by mouth at bedtime.     . Vitamin D, Ergocalciferol, (DRISDOL) 50000 UNITS CAPS Take 50,000 Units by mouth every Tuesday.      No current facility-administered medications for this visit.    Allergies:   Sulfamethoxazole   Social History:  The patient  reports that she quit smoking about 27 years ago. Her smoking use included Cigarettes. She started smoking about 56 years ago. She has a 20 pack-year smoking history. She has never  used smokeless tobacco. She reports that she does not drink alcohol or use illicit drugs.   Family History:  The patient's  family history includes Diabetes in her mother; Heart attack in her father; Hypertension in her father and mother; Skin cancer in her brother. There is no history of Colon cancer, Esophageal cancer, Rectal cancer, or Stomach cancer.    ROS:  Please see the history of present illness.   All other systems are reviewed and negative.    PHYSICAL EXAM: VS:  BP 164/92 mmHg  Pulse 59  Ht 5\' 2"  (1.575 m)  Wt 111 lb 6.4 oz (50.531 kg)  BMI 20.37 kg/m2  SpO2 98% , BMI Body mass index is 20.37 kg/(m^2). GEN: elderly and frail, in no acute distress HEENT: normal Neck: no JVD, carotid bruits, or masses Cardiac: RRR; no murmurs, rubs, or gallops,no edema  Respiratory:  clear to auscultation bilaterally, normal work of breathing GI: soft, nontender, nondistended, + BS MS: no deformity or atrophy Skin: warm and dry  Neuro:  Strength and sensation are intact Psych: euthymic mood, full affect   Recent Labs: 03/04/2015: TSH 7.104* 06/17/2015: Magnesium  1.6* 06/19/2015: B Natriuretic Peptide 217.0* 06/29/2015: ALT 13*; BUN 16; Creatinine, Ser 1.26*; Hemoglobin 10.4*; Platelets 268; Potassium 3.8; Sodium 141    Lipid Panel  No results found for: CHOL, TRIG, HDL, CHOLHDL, VLDL, LDLCALC, LDLDIRECT   Wt Readings from Last 3 Encounters:  08/02/15 111 lb 6.4 oz (50.531 kg)  07/07/15 121 lb (54.885 kg)  06/29/15 121 lb (54.885 kg)    ASSESSMENT AND PLAN:  1.  afib The patient has stable afib.  Her chads2vasc score is at least 5.  She is not currently a candidate for anticoagulation due to anemia/ bleeding.  She has been found to have an abdominal mass/ polyp which may actually be the cause for her bleeding. At the present time, I would not favor watchman left atrial appendage occlusion.  I think that a better strategy would be to proceed with surgical colonic resection if medically indicated.  We can reassess her bleeding risks after she has recovered from this procedure and then determine whether resumption of eliquis or reconsideration of watchman are appropriate at that time. Continue current medical therapy for afib Dr Bronson Ing to assist with preoperative clearance from surgery.  From my standpoint, she is ok to proceed.  I do think that given her advanced age and comoribidites that she is at least moderate risk for surgery.  I do not think that we can reduce her surgical risks with CV procedures at this time.  Chanetta Marshall NP [employer] manager) will follow-up with the patient after she recovers from her GI procedure to determine if she is a Forensic scientist candidate at that time.  Current medicines are reviewed at length with the patient today.   The patient does not have concerns regarding her medicines.  The following changes were made today:  none  Signed, Thompson Grayer, MD  08/02/2015 11:27 AM     [facility] [address] [place] [address] [phone] (office) [phone] (fax)

## 2015-08-02 NOTE — Patient Instructions (Signed)
Medication Instructions:  Your physician recommends that you continue on your current medications as directed. Please refer to the Current Medication list given to you today.   Labwork: None ordered   Testing/Procedures: None ordered   Follow-Up:  Chanetta Marshall, NP will call you with plan and follow up  Any Other Special Instructions Will Be Listed Below (If Applicable).     If you need a refill on your cardiac medications before your next appointment, please call your pharmacy.

## 2015-08-03 ENCOUNTER — Inpatient Hospital Stay (HOSPITAL_COMMUNITY): Payer: Medicare Other

## 2015-08-03 ENCOUNTER — Encounter (HOSPITAL_COMMUNITY): Payer: Self-pay | Admitting: Emergency Medicine

## 2015-08-03 ENCOUNTER — Inpatient Hospital Stay (HOSPITAL_COMMUNITY)
Admission: EM | Admit: 2015-08-03 | Discharge: 2015-08-09 | DRG: 380 | Disposition: A | Discharge status: Home Health | Payer: Medicare Other | Attending: Internal Medicine | Admitting: Internal Medicine

## 2015-08-03 ENCOUNTER — Encounter: Payer: Self-pay | Admitting: General Surgery

## 2015-08-03 ENCOUNTER — Emergency Department (HOSPITAL_COMMUNITY): Payer: Medicare Other

## 2015-08-03 DIAGNOSIS — K2210 Ulcer of esophagus without bleeding: Secondary | ICD-10-CM | POA: Diagnosis present

## 2015-08-03 DIAGNOSIS — I110 Hypertensive heart disease with heart failure: Secondary | ICD-10-CM | POA: Diagnosis present

## 2015-08-03 DIAGNOSIS — I071 Rheumatic tricuspid insufficiency: Secondary | ICD-10-CM | POA: Diagnosis present

## 2015-08-03 DIAGNOSIS — K3189 Other diseases of stomach and duodenum: Secondary | ICD-10-CM | POA: Diagnosis present

## 2015-08-03 DIAGNOSIS — Z79899 Other long term (current) drug therapy: Secondary | ICD-10-CM

## 2015-08-03 DIAGNOSIS — N200 Calculus of kidney: Secondary | ICD-10-CM | POA: Diagnosis present

## 2015-08-03 DIAGNOSIS — Z961 Presence of intraocular lens: Secondary | ICD-10-CM | POA: Diagnosis present

## 2015-08-03 DIAGNOSIS — K219 Gastro-esophageal reflux disease without esophagitis: Secondary | ICD-10-CM | POA: Diagnosis present

## 2015-08-03 DIAGNOSIS — D122 Benign neoplasm of ascending colon: Secondary | ICD-10-CM | POA: Diagnosis present

## 2015-08-03 DIAGNOSIS — K440 Diaphragmatic hernia with obstruction, without gangrene: Secondary | ICD-10-CM | POA: Diagnosis present

## 2015-08-03 DIAGNOSIS — D490 Neoplasm of unspecified behavior of digestive system: Secondary | ICD-10-CM | POA: Diagnosis present

## 2015-08-03 DIAGNOSIS — Z9841 Cataract extraction status, right eye: Secondary | ICD-10-CM | POA: Diagnosis not present

## 2015-08-03 DIAGNOSIS — Z9842 Cataract extraction status, left eye: Secondary | ICD-10-CM

## 2015-08-03 DIAGNOSIS — E43 Unspecified severe protein-calorie malnutrition: Secondary | ICD-10-CM | POA: Diagnosis present

## 2015-08-03 DIAGNOSIS — K449 Diaphragmatic hernia without obstruction or gangrene: Secondary | ICD-10-CM

## 2015-08-03 DIAGNOSIS — Z955 Presence of coronary angioplasty implant and graft: Secondary | ICD-10-CM | POA: Diagnosis not present

## 2015-08-03 DIAGNOSIS — Z833 Family history of diabetes mellitus: Secondary | ICD-10-CM

## 2015-08-03 DIAGNOSIS — K311 Adult hypertrophic pyloric stenosis: Principal | ICD-10-CM | POA: Diagnosis present

## 2015-08-03 DIAGNOSIS — I4891 Unspecified atrial fibrillation: Secondary | ICD-10-CM | POA: Diagnosis not present

## 2015-08-03 DIAGNOSIS — Z85038 Personal history of other malignant neoplasm of large intestine: Secondary | ICD-10-CM

## 2015-08-03 DIAGNOSIS — Z87891 Personal history of nicotine dependence: Secondary | ICD-10-CM | POA: Diagnosis not present

## 2015-08-03 DIAGNOSIS — R112 Nausea with vomiting, unspecified: Secondary | ICD-10-CM | POA: Diagnosis present

## 2015-08-03 DIAGNOSIS — I272 Other secondary pulmonary hypertension: Secondary | ICD-10-CM | POA: Diagnosis present

## 2015-08-03 DIAGNOSIS — K210 Gastro-esophageal reflux disease with esophagitis: Secondary | ICD-10-CM | POA: Diagnosis present

## 2015-08-03 DIAGNOSIS — Z8249 Family history of ischemic heart disease and other diseases of the circulatory system: Secondary | ICD-10-CM | POA: Diagnosis not present

## 2015-08-03 DIAGNOSIS — K589 Irritable bowel syndrome without diarrhea: Secondary | ICD-10-CM | POA: Diagnosis present

## 2015-08-03 DIAGNOSIS — R627 Adult failure to thrive: Secondary | ICD-10-CM | POA: Diagnosis present

## 2015-08-03 DIAGNOSIS — I4892 Unspecified atrial flutter: Secondary | ICD-10-CM | POA: Diagnosis not present

## 2015-08-03 DIAGNOSIS — Z808 Family history of malignant neoplasm of other organs or systems: Secondary | ICD-10-CM | POA: Diagnosis not present

## 2015-08-03 DIAGNOSIS — F411 Generalized anxiety disorder: Secondary | ICD-10-CM | POA: Diagnosis present

## 2015-08-03 DIAGNOSIS — I4581 Long QT syndrome: Secondary | ICD-10-CM

## 2015-08-03 DIAGNOSIS — F329 Major depressive disorder, single episode, unspecified: Secondary | ICD-10-CM | POA: Diagnosis present

## 2015-08-03 DIAGNOSIS — E860 Dehydration: Secondary | ICD-10-CM | POA: Diagnosis present

## 2015-08-03 DIAGNOSIS — I10 Essential (primary) hypertension: Secondary | ICD-10-CM | POA: Diagnosis not present

## 2015-08-03 DIAGNOSIS — E7800 Pure hypercholesterolemia, unspecified: Secondary | ICD-10-CM | POA: Diagnosis present

## 2015-08-03 DIAGNOSIS — R933 Abnormal findings on diagnostic imaging of other parts of digestive tract: Secondary | ICD-10-CM | POA: Diagnosis not present

## 2015-08-03 DIAGNOSIS — I480 Paroxysmal atrial fibrillation: Secondary | ICD-10-CM | POA: Diagnosis present

## 2015-08-03 DIAGNOSIS — K8020 Calculus of gallbladder without cholecystitis without obstruction: Secondary | ICD-10-CM | POA: Diagnosis present

## 2015-08-03 DIAGNOSIS — Z933 Colostomy status: Secondary | ICD-10-CM | POA: Diagnosis not present

## 2015-08-03 DIAGNOSIS — E876 Hypokalemia: Secondary | ICD-10-CM | POA: Diagnosis present

## 2015-08-03 DIAGNOSIS — R1111 Vomiting without nausea: Secondary | ICD-10-CM | POA: Diagnosis not present

## 2015-08-03 DIAGNOSIS — R1110 Vomiting, unspecified: Secondary | ICD-10-CM | POA: Diagnosis present

## 2015-08-03 DIAGNOSIS — Z6820 Body mass index (BMI) 20.0-20.9, adult: Secondary | ICD-10-CM

## 2015-08-03 DIAGNOSIS — R4781 Slurred speech: Secondary | ICD-10-CM | POA: Diagnosis not present

## 2015-08-03 DIAGNOSIS — H3530 Unspecified macular degeneration: Secondary | ICD-10-CM | POA: Diagnosis present

## 2015-08-03 DIAGNOSIS — Z882 Allergy status to sulfonamides status: Secondary | ICD-10-CM | POA: Diagnosis not present

## 2015-08-03 DIAGNOSIS — D649 Anemia, unspecified: Secondary | ICD-10-CM | POA: Diagnosis present

## 2015-08-03 DIAGNOSIS — G459 Transient cerebral ischemic attack, unspecified: Secondary | ICD-10-CM | POA: Diagnosis not present

## 2015-08-03 DIAGNOSIS — Z9049 Acquired absence of other specified parts of digestive tract: Secondary | ICD-10-CM | POA: Diagnosis not present

## 2015-08-03 DIAGNOSIS — Z85820 Personal history of malignant melanoma of skin: Secondary | ICD-10-CM

## 2015-08-03 DIAGNOSIS — I5032 Chronic diastolic (congestive) heart failure: Secondary | ICD-10-CM | POA: Diagnosis present

## 2015-08-03 LAB — COMPREHENSIVE METABOLIC PANEL
ALT: 8 U/L — AB (ref 14–54)
AST: 17 U/L (ref 15–41)
Albumin: 4.2 g/dL (ref 3.5–5.0)
Alkaline Phosphatase: 64 U/L (ref 38–126)
Anion gap: 16 — ABNORMAL HIGH (ref 5–15)
BUN: 14 mg/dL (ref 6–20)
CHLORIDE: 97 mmol/L — AB (ref 101–111)
CO2: 24 mmol/L (ref 22–32)
CREATININE: 1.28 mg/dL — AB (ref 0.44–1.00)
Calcium: 9.6 mg/dL (ref 8.9–10.3)
GFR calc non Af Amer: 39 mL/min — ABNORMAL LOW (ref >60)
GFR, EST AFRICAN AMERICAN: 45 mL/min — AB (ref >60)
Glucose, Bld: 113 mg/dL — ABNORMAL HIGH (ref 65–99)
POTASSIUM: 2.9 mmol/L — AB (ref 3.5–5.1)
SODIUM: 137 mmol/L (ref 135–145)
Total Bilirubin: 1.1 mg/dL (ref 0.3–1.2)
Total Protein: 6.8 g/dL (ref 6.5–8.1)

## 2015-08-03 LAB — URINALYSIS, ROUTINE W REFLEX MICROSCOPIC
BILIRUBIN URINE: NEGATIVE
Glucose, UA: NEGATIVE mg/dL
Ketones, ur: 40 mg/dL — AB
NITRITE: NEGATIVE
PH: 7 (ref 5.0–8.0)
Protein, ur: 100 mg/dL — AB
SPECIFIC GRAVITY, URINE: 1.022 (ref 1.005–1.030)

## 2015-08-03 LAB — URINE MICROSCOPIC-ADD ON

## 2015-08-03 LAB — CBC WITH DIFFERENTIAL/PLATELET
Basophils Absolute: 0 10*3/uL (ref 0.0–0.1)
Basophils Relative: 0 %
Eosinophils Absolute: 0 10*3/uL (ref 0.0–0.7)
Eosinophils Relative: 0 %
HEMATOCRIT: 40.9 % (ref 36.0–46.0)
HEMOGLOBIN: 13.1 g/dL (ref 12.0–15.0)
LYMPHS ABS: 2.9 10*3/uL (ref 0.7–4.0)
LYMPHS PCT: 29 %
MCH: 25.5 pg — AB (ref 26.0–34.0)
MCHC: 32 g/dL (ref 30.0–36.0)
MCV: 79.7 fL (ref 78.0–100.0)
MONOS PCT: 6 %
Monocytes Absolute: 0.6 10*3/uL (ref 0.1–1.0)
NEUTROS ABS: 6.6 10*3/uL (ref 1.7–7.7)
NEUTROS PCT: 65 %
Platelets: 266 10*3/uL (ref 150–400)
RBC: 5.13 MIL/uL — AB (ref 3.87–5.11)
RDW: 16.5 % — ABNORMAL HIGH (ref 11.5–15.5)
WBC: 10.1 10*3/uL (ref 4.0–10.5)

## 2015-08-03 LAB — LIPASE, BLOOD: LIPASE: 18 U/L (ref 11–51)

## 2015-08-03 LAB — PREALBUMIN: Prealbumin: 15.6 mg/dL — ABNORMAL LOW (ref 18–38)

## 2015-08-03 MED ORDER — HYDRALAZINE HCL 20 MG/ML IJ SOLN
5.0000 mg | Freq: Once | INTRAMUSCULAR | Status: AC
  Administered 2015-08-03: 5 mg via INTRAVENOUS
  Filled 2015-08-03: qty 1

## 2015-08-03 MED ORDER — POTASSIUM CHLORIDE 10 MEQ/100ML IV SOLN
10.0000 meq | Freq: Once | INTRAVENOUS | Status: AC
  Administered 2015-08-03: 10 meq via INTRAVENOUS
  Filled 2015-08-03: qty 100

## 2015-08-03 MED ORDER — ONDANSETRON HCL 4 MG/2ML IJ SOLN
4.0000 mg | Freq: Once | INTRAMUSCULAR | Status: AC
  Administered 2015-08-03: 4 mg via INTRAVENOUS
  Filled 2015-08-03: qty 2

## 2015-08-03 MED ORDER — POTASSIUM CHLORIDE IN NACL 20-0.9 MEQ/L-% IV SOLN
INTRAVENOUS | Status: AC
  Administered 2015-08-03 – 2015-08-06 (×6): via INTRAVENOUS
  Filled 2015-08-03 (×7): qty 1000

## 2015-08-03 MED ORDER — POTASSIUM CHLORIDE 10 MEQ/100ML IV SOLN
10.0000 meq | INTRAVENOUS | Status: AC
  Administered 2015-08-03 – 2015-08-04 (×4): 10 meq via INTRAVENOUS
  Filled 2015-08-03 (×4): qty 100

## 2015-08-03 MED ORDER — PROCHLORPERAZINE EDISYLATE 5 MG/ML IJ SOLN: 5.0000 mg | Freq: Four times a day (QID) | INTRAMUSCULAR | Status: DC | PRN

## 2015-08-03 MED ORDER — ONDANSETRON HCL 4 MG/2ML IJ SOLN
4.0000 mg | Freq: Three times a day (TID) | INTRAMUSCULAR | Status: DC | PRN
  Administered 2015-08-04 – 2015-08-07 (×4): 4 mg via INTRAVENOUS
  Filled 2015-08-03 (×4): qty 2

## 2015-08-03 MED ORDER — HYDRALAZINE HCL 20 MG/ML IJ SOLN
5.0000 mg | Freq: Once | INTRAMUSCULAR | Status: AC
  Administered 2015-08-03: 5 mg via INTRAVENOUS

## 2015-08-03 MED ORDER — HYDRALAZINE HCL 20 MG/ML IJ SOLN
10.0000 mg | Freq: Three times a day (TID) | INTRAMUSCULAR | Status: DC | PRN
  Administered 2015-08-03: 5 mg via INTRAVENOUS
  Administered 2015-08-04 – 2015-08-05 (×3): 10 mg via INTRAVENOUS
  Filled 2015-08-03 (×5): qty 1

## 2015-08-03 MED ORDER — SODIUM CHLORIDE 0.9% FLUSH
3.0000 mL | Freq: Two times a day (BID) | INTRAVENOUS | Status: DC
  Administered 2015-08-05 – 2015-08-07 (×4): 3 mL via INTRAVENOUS
  Administered 2015-08-08: 10:00:00 via INTRAVENOUS
  Administered 2015-08-08 – 2015-08-09 (×2): 3 mL via INTRAVENOUS

## 2015-08-03 MED ORDER — ZOLPIDEM TARTRATE 5 MG PO TABS
5.0000 mg | ORAL_TABLET | Freq: Once | ORAL | Status: AC
  Administered 2015-08-03: 5 mg via ORAL
  Filled 2015-08-03: qty 1

## 2015-08-03 MED ORDER — NITROGLYCERIN 0.4 MG SL SUBL: 0.4000 mg | SUBLINGUAL_TABLET | SUBLINGUAL | Status: DC | PRN

## 2015-08-03 MED ORDER — SODIUM CHLORIDE 0.9 % IV BOLUS (SEPSIS)
500.0000 mL | Freq: Once | INTRAVENOUS | Status: AC
  Administered 2015-08-03: 500 mL via INTRAVENOUS

## 2015-08-03 MED ORDER — ENOXAPARIN SODIUM 30 MG/0.3ML ~~LOC~~ SOLN
30.0000 mg | SUBCUTANEOUS | Status: DC
  Administered 2015-08-03: 30 mg via SUBCUTANEOUS
  Filled 2015-08-03: qty 0.3

## 2015-08-03 MED ORDER — IOPAMIDOL (ISOVUE-300) INJECTION 61%
100.0000 mL | Freq: Once | INTRAVENOUS | Status: AC | PRN
  Administered 2015-08-03: 80 mL via INTRAVENOUS

## 2015-08-03 MED ORDER — ONDANSETRON HCL 4 MG/2ML IJ SOLN
4.0000 mg | Freq: Once | INTRAMUSCULAR | Status: AC | PRN
  Administered 2015-08-03: 4 mg via INTRAVENOUS
  Filled 2015-08-03: qty 2

## 2015-08-03 NOTE — Progress Notes (Signed)
Subjective: Patient seen and examined.  She has had a slow, progressive decline ever since her emergency Hartmann procedure in December. She has a slowly bleeding ascending colon tumor as well as symptomatic cholelithiasis. Recently, however, she's had an acute change with respect to severe failure to thrive. Most of this involved burning epigastric pain going through to the back as well as nausea and vomiting. She has an irreducible hiatal hernia. She has lost a significant amount of weight.  She has a number of significant co-morbidities as well.  Objective: Vital signs in last 24 hours: Temp:  [98.4 F (36.9 C)] 98.4 F (36.9 C) (05/04 1310) Pulse Rate:  [61] 61 (05/04 1310) Resp:  [16] 16 (05/04 1310) BP: (206)/(98) 206/98 mmHg (05/04 1310) SpO2:  [100 %] 100 % (05/04 1310)    Intake/Output from previous day:   Intake/Output this shift:    Prior to Admission medications   Medication Sig Start Date End Date Taking? Authorizing Provider  albuterol (PROVENTIL HFA;VENTOLIN HFA) 108 (90 Base) MCG/ACT inhaler Inhale 2 puffs into the lungs as directed.    Historical Provider, MD  ALPRAZolam Duanne Moron) 0.25 MG tablet Take 1 tablet (0.25 mg total) by mouth 2 (two) times daily as needed for anxiety. 05/08/15   Theodis Blaze, MD  citalopram (CELEXA) 40 MG tablet Take 40 mg by mouth at bedtime. Pt takes 20 mg daily at bedtime    Historical Provider, MD  esomeprazole (NEXIUM) 40 MG capsule Take 40 mg by mouth daily before breakfast.     Historical Provider, MD  ferrous sulfate 325 (65 FE) MG EC tablet Take 325 mg by mouth 3 (three) times daily.     Historical Provider, MD  furosemide (LASIX) 20 MG tablet Take 1 tablet (20 mg total) by mouth daily. 06/19/15   Kinnie Feil, MD  isosorbide-hydrALAZINE (BIDIL) 20-37.5 MG tablet Take 2 tablets by mouth 3 (three) times daily. 06/19/15   Kinnie Feil, MD  losartan (COZAAR) 100 MG tablet Take 1 tablet (100 mg total) by mouth daily. 11/11/12   Herminio Commons, MD  metoCLOPramide (REGLAN) 5 MG tablet Take 1 tablet (5 mg total) by mouth 3 (three) times daily before meals. 06/14/15   Jerene Bears, MD  metoprolol tartrate (LOPRESSOR) 25 MG tablet Take 1 tablet (25 mg total) by mouth 2 (two) times daily. 03/14/15   Silver Huguenin Elgergawy, MD  Multiple Vitamins-Minerals (PRESERVISION AREDS) TABS Take 1 tablet by mouth 2 (two) times daily.     Historical Provider, MD  nitroGLYCERIN (NITROSTAT) 0.4 MG SL tablet Place 0.4 mg under the tongue every 5 (five) minutes as needed for chest pain. Reported on 04/26/2015    Historical Provider, MD  ondansetron (ZOFRAN-ODT) 4 MG disintegrating tablet Take 4 mg by mouth as directed.    Historical Provider, MD  ondansetron (ZOFRAN-ODT) 4 MG disintegrating tablet Take 1 tablet (4 mg total) by mouth every 8 (eight) hours as needed for nausea or vomiting. 08/02/15   Jerene Bears, MD  polyethylene glycol powder (GLYCOLAX/MIRALAX) powder MIX 17 GRAMS IN AT LEAST 8OZ OF WATER/JUICE AND DRINK TWICE DAILY 06/21/15   Jerene Bears, MD  potassium chloride (K-DUR) 10 MEQ tablet Take 1 tablet (10 mEq total) by mouth daily. 06/19/15   Kinnie Feil, MD  Propylene Glycol (SYSTANE BALANCE OP) Place 1-2 drops into both eyes daily as needed (dry eyes). Reported on 04/26/2015    Historical Provider, MD  rosuvastatin (CRESTOR) 10 MG tablet Take 10  mg by mouth at bedtime.     Historical Provider, MD  traZODone (DESYREL) 100 MG tablet Take 100 mg by mouth at bedtime.     Historical Provider, MD  Vitamin D, Ergocalciferol, (DRISDOL) 50000 UNITS CAPS Take 50,000 Units by mouth every Tuesday.     Historical Provider, MD     PE: General- Weak appearing cachectic female HEENT-dry mucous membranes Abdomen-Soft, midline scar, left lower quadrant colostomy, no tenderness. No palpable mass.  Lab Results:   Recent Labs  08/03/15 1358  WBC 10.1  HGB 13.1  HCT 40.9  PLT 266   BMET No results for input(s): NA, K, CL, CO2, GLUCOSE, BUN,  CREATININE, CALCIUM in the last 72 hours. PT/INR No results for input(s): LABPROT, INR in the last 72 hours. Comprehensive Metabolic Panel:    Component Value Date/Time   NA 141 06/29/2015 1615   NA 141 06/19/2015 0555   K 3.8 06/29/2015 1615   K 3.9 06/19/2015 0555   CL 103 06/29/2015 1615   CL 99* 06/19/2015 0555   CO2 27 06/29/2015 1615   CO2 36* 06/19/2015 0555   BUN 16 06/29/2015 1615   BUN 14 06/19/2015 0555   CREATININE 1.26* 06/29/2015 1615   CREATININE 1.24* 06/19/2015 0555   GLUCOSE 122* 06/29/2015 1615   GLUCOSE 103* 06/19/2015 0555   CALCIUM 9.7 06/29/2015 1615   CALCIUM 8.9 06/19/2015 0555   AST 19 06/29/2015 1615   AST 12* 05/02/2015 1237   ALT 13* 06/29/2015 1615   ALT 8* 05/02/2015 1237   ALKPHOS 60 06/29/2015 1615   ALKPHOS 90 05/02/2015 1237   BILITOT 0.5 06/29/2015 1615   BILITOT 0.4 05/02/2015 1237   PROT 6.9 06/29/2015 1615   PROT 5.8* 05/02/2015 1237   ALBUMIN 3.9 06/29/2015 1615   ALBUMIN 2.4* 05/02/2015 1237     Studies/Results: No results found.  Anti-infectives: Anti-infectives    None      Assessment 1.  Severe failure to thrive with nausea and vomiting. Etiology unclear.   2.  Dehydration.  3.  Slowly bleeding ascending colon tumor and symptomatic cholelithiasis-two-week to withstand the surgery at this time.  4.  Severe malnutrition.      Rec:  I feel she needs a thorough medical evaluation to see why she is having this failure to thrive as well as IV fluid hydration. Would check a pre-albumin. I do not think it's safe to send her home in my opinion.  Will see her and assist with the evaluation is needed I'll she's here in the hospital. In order for her to have the major surgery, she needs to be significantly stronger and her malnutrition and needs to be reversed.   Adylin Hankey J 08/03/2015

## 2015-08-03 NOTE — H&P (Signed)
History and Physical  [name] [id number] DOB: [birth year] DOA: 08/03/2015  Referring physician: EDP PCP: Terald Sleeper, PA-C   Chief Complaint: intractable N/V for the last three weeks  HPI: [name] is a 81 y.o. female  H/o PAF , not on anticoagulation due to gi bleed/anemia, actively followed by cardiology, h/o larger hiatal hernia, h/o perforated diverticulitis s/p resection and colostomy, h/o right colon mass under evaluation for surgical resection who developed intractable n/v for the last three weeks, she was seen by gi and general surgery but her symptoms became worse, she presented to [facility] long ED. In the ED , she had multiple episodes of vomiting, no reported hematemesis, denies ab pain, reported colostomy output wnl. No fever, general surgery seen patient and recommended admit to hospitalist service, general surgery will consult. Labs showed hypokalemia, elevated cr at 1.28. lft wnl, no leukocytosis, hgb wnl. Patient does has elevated blood pressure in the ED, she denies headache, no chest pain, reported she has not been able to keep any her meds done. No abdominal imagine obtained when i was paged, i have request EDP to get CT ab.   Review of Systems:  Detail per HPI, Review of systems are otherwise negative  Past Medical History  Diagnosis Date  . Depressive disorder, not elsewhere classified   . Anxiety state, unspecified   . Pure hypercholesterolemia   . Esophageal reflux   . Infection of esophagostomy ([place])   . Congestive heart failure, unspecified     diastolic heart failure  . Pulmonary hypertension (HCC)     PA systolic pressure [id number] mmHg by echocardiogram, PA pressure 33/10 by cardiac catheterization PA saturation 62% thermodilution cardiac index 2.0 thick cardiac index 2.4  . Tricuspid regurgitation     moderate tricuspid regurgitation by echocardiogram, prior use of anorexic agents  . Melanoma ([place])     removal on back   . Coronary artery disease    a. Moderate to severe coronary disease involving the left anterior descending artery and right coronary artery.  Sequential stenosis in the LAD is significant.  Right coronary artery is moderate to severely likely nonischemic. Questionable small LVOT obstruction.  No Brockenbrough maneuver was performed.  Catheterization January 2013; b. 05/2014 MV no isch/infarct, EF 60%.  . Hypertension   . H/O hiatal hernia   . Gastritis   . [name]'s esophagus   . IBS (irritable bowel syndrome)   . Macular degeneration   . Diverticulitis   . Perforated bowel ([place])   . Anemia   . Atrial fibrillation with RVR ([place])   . Perforated sigmoid colon ([place])   . Cellulitis of extremity   . Pelvic abscess in female   . PONV (postoperative nausea and vomiting)    Past Surgical History  Procedure Laterality Date  . Nissen fundoplication  99991111  . Rotator cuff repair  ~ 2001    right  . Coronary angioplasty with stent placement  05/08/11    "1"  . Tonsillectomy  ~ 1944  . Shoulder arthroscopy  ~ 2004; 2005    left; "joint's wore out"  . Cataract extraction w/ intraocular lens  implant, bilateral  ~ 2002  . Tubal ligation  1972  . Percutaneous coronary stent intervention (pci-s) N/A 05/08/2011    Procedure: PERCUTANEOUS CORONARY STENT INTERVENTION (PCI-S);  Surgeon: Wellington Hampshire, MD;  Location: [facility] CATH LAB;  Service: Cardiovascular;  Laterality: N/A;  . Esophageal manometry N/A 06/06/2014    Procedure: ESOPHAGEAL MANOMETRY (EM);  Surgeon:  Jerene Bears, MD;  Location: [facility] ENDOSCOPY;  Service: Gastroenterology;  Laterality: N/A;  . Laparoscopic nissen fundoplication N/A [id number]    Procedure: LAPAROSCOPIC  LYSIS OF ADHESIONS, SEGMENTAL GASTRECTOMY, PARTIAL REDUCTION OF HERNIA;  Surgeon: [name], MD;  Location: [facility] ORS;  Service: General;  Laterality: N/A;  . Hernia repair    . Colectomy with colostomy creation/hartmann procedure N/A 03/05/2015    Procedure: PARTIAL COLECTOMY WITH COLOSTOMY CREATION/HARTMANN  PROCEDURE;  Surgeon: Aviva Signs, MD;  Location: [facility] ORS;  Service: General;  Laterality: N/A;  . Cardiac catheterization Left 03/05/2015    Procedure: CENTRAL LINE INSERTION;  Surgeon: Aviva Signs, MD;  Location: [facility] ORS;  Service: General;  Laterality: Left;   Social History:  reports that she quit smoking about 27 years ago. Her smoking use included Cigarettes. She started smoking about 56 years ago. She has a 20 pack-year smoking history. She has never used smokeless tobacco. She reports that she does not drink alcohol or use illicit drugs. Patient lives at home with good family support & is able to participate in activities of daily living independently   Allergies  Allergen Reactions  . Sulfamethoxazole Rash    Family History  Problem Relation Age of Onset  . Heart attack Father   . Hypertension Father   . Diabetes Mother   . Hypertension Mother   . Colon cancer Neg Hx   . Esophageal cancer Neg Hx   . Rectal cancer Neg Hx   . Stomach cancer Neg Hx   . Skin cancer Brother     melanoma      Prior to Admission medications   Medication Sig Start Date End Date Taking? Authorizing Provider  albuterol (PROVENTIL HFA;VENTOLIN HFA) 108 (90 Base) MCG/ACT inhaler Inhale 2 puffs into the lungs every 6 (six) hours as needed for wheezing or shortness of breath.    Yes Historical Provider, MD  ALPRAZolam (XANAX) 0.25 MG tablet Take 1 tablet (0.25 mg total) by mouth 2 (two) times daily as needed for anxiety. 05/08/15  Yes Theodis Blaze, MD  citalopram (CELEXA) 40 MG tablet Take 20 mg by mouth at bedtime.    Yes Historical Provider, MD  esomeprazole (NEXIUM) 40 MG capsule Take 40 mg by mouth daily before breakfast.    Yes Historical Provider, MD  ferrous sulfate 325 (65 FE) MG EC tablet Take 325 mg by mouth 3 (three) times daily.    Yes Historical Provider, MD  furosemide (LASIX) 20 MG tablet Take 1 tablet (20 mg total) by mouth daily. 06/19/15  Yes Kinnie Feil, MD  hydrALAZINE (APRESOLINE)  25 MG tablet Take 25 mg by mouth 3 (three) times daily. 07/03/15  Yes Historical Provider, MD  isosorbide mononitrate (IMDUR) 30 MG 24 hr tablet Take 30 mg by mouth daily. 07/21/15  Yes Historical Provider, MD  losartan (COZAAR) 100 MG tablet Take 1 tablet (100 mg total) by mouth daily. 11/11/12  Yes Herminio Commons, MD  metoCLOPramide (REGLAN) 5 MG tablet Take 1 tablet (5 mg total) by mouth 3 (three) times daily before meals. 06/14/15  Yes Jerene Bears, MD  metoprolol succinate (TOPROL-XL) 25 MG 24 hr tablet Take 25 mg by mouth 2 (two) times daily. 07/21/15  Yes Historical Provider, MD  Multiple Vitamins-Minerals (PRESERVISION AREDS) TABS Take 1 tablet by mouth 2 (two) times daily.    Yes Historical Provider, MD  nitroGLYCERIN (NITROSTAT) 0.4 MG SL tablet Place 0.4 mg under the tongue every 5 (five) minutes as needed for  chest pain. Reported on 04/26/2015   Yes Historical Provider, MD  ondansetron (ZOFRAN-ODT) 4 MG disintegrating tablet Take 1 tablet (4 mg total) by mouth every 8 (eight) hours as needed for nausea or vomiting. 08/02/15  Yes Jerene Bears, MD  polyethylene glycol powder (GLYCOLAX/MIRALAX) powder MIX 17 GRAMS IN AT LEAST 8OZ OF WATER/JUICE AND DRINK TWICE DAILY 06/21/15  Yes Jerene Bears, MD  potassium chloride (K-DUR) 10 MEQ tablet Take 1 tablet (10 mEq total) by mouth daily. 06/19/15  Yes Kinnie Feil, MD  Propylene Glycol (SYSTANE BALANCE OP) Place 1-2 drops into both eyes daily as needed (dry eyes). Reported on 04/26/2015   Yes Historical Provider, MD  rosuvastatin (CRESTOR) 10 MG tablet Take 10 mg by mouth at bedtime.    Yes Historical Provider, MD  traZODone (DESYREL) 100 MG tablet Take 100 mg by mouth at bedtime.    Yes Historical Provider, MD  Vitamin D, Ergocalciferol, (DRISDOL) 50000 UNITS CAPS Take 50,000 Units by mouth every Tuesday.    Yes Historical Provider, MD  isosorbide-hydrALAZINE (BIDIL) 20-37.5 MG tablet Take 2 tablets by mouth 3 (three) times daily. Patient not taking:  Reported on 08/03/2015 06/19/15   Kinnie Feil, MD  metoprolol tartrate (LOPRESSOR) 25 MG tablet Take 1 tablet (25 mg total) by mouth 2 (two) times daily. Patient not taking: Reported on 08/03/2015 03/14/15   Albertine Patricia, MD    Physical Exam: BP 198/79 mmHg  Pulse 56  Temp(Src) 98.4 F (36.9 C) (Oral)  Resp 19  SpO2 99%  General:  Frail, very thin Eyes: PERRL ENT: dry oral mucosa Neck: supple, no JVD Cardiovascular: RRR Respiratory: CTABL Abdomen: soft/NT/ND, positive bowel sounds, +colostomy  Skin: no rash Musculoskeletal:  No edema Psychiatric: calm/cooperative Neurologic: no focal findings            Labs on Admission:  Basic Metabolic Panel:  Recent Labs Lab 08/03/15 1359  NA 137  K 2.9*  CL 97*  CO2 24  GLUCOSE 113*  BUN 14  CREATININE 1.28*  CALCIUM 9.6   Liver Function Tests:  Recent Labs Lab 08/03/15 1359  AST 17  ALT 8*  ALKPHOS 64  BILITOT 1.1  PROT 6.8  ALBUMIN 4.2    Recent Labs Lab 08/03/15 1359  LIPASE 18   No results for input(s): AMMONIA in the last 168 hours. CBC:  Recent Labs Lab 08/03/15 1358  WBC 10.1  NEUTROABS 6.6  HGB 13.1  HCT 40.9  MCV 79.7  PLT 266   Cardiac Enzymes: No results for input(s): CKTOTAL, CKMB, CKMBINDEX, TROPONINI in the last 168 hours.  BNP (last 3 results)  Recent Labs  06/16/15 2233 06/19/15 0555  BNP 1250.0* 217.0*    ProBNP (last 3 results) No results for input(s): PROBNP in the last 8760 hours.  CBG: No results for input(s): GLUCAP in the last 168 hours.  Radiological Exams on Admission: No results found.  EKG: Independently reviewed. NSR, QTc prolongation (528ms)  Assessment/Plan Present on Admission:  **None**  Intractable N/V: with normal colostomy function, lft wnl, no ab pain, ct ab pending, npo/ ivf/prn antiemetics  Hypokalemia: replace k,   HTN: uncontrolled due to not able to keep oral meds down, prn hydralazine for now  H/o PAF, now in NSR, not on  anticoagulation due to h/o GI bleed anemia  H/o hiatal hernia, acid reflux, on iv ppi  H/o colonic mass, general surgery following     DVT prophylaxis: lovenox  Consultants: general surgery  Code  Status: full   Family Communication:  Patient and daughter in room  Disposition Plan: admit to med tele  Time spent: 42min  Britton Bera MD, PhD [facility] Pager [phone] If 7PM-7AM, please contact night-coverage at [email or web address], password [facility]

## 2015-08-03 NOTE — ED Notes (Addendum)
Patient with hx of colon cancer coming from [place] for chronic nausea and emesis x 1-2 weeks, extreme weight loss, sent here for weakness and malnutrition. Epigastric pain. Rosenbower is MD.

## 2015-08-03 NOTE — ED Provider Notes (Signed)
CSN: [id number]     Arrival date & time 08/03/15  1251 History   First MD Initiated Contact with Patient 08/03/15 1332     Chief Complaint  Patient presents with  . Emesis     (Consider location/radiation/quality/duration/timing/severity/associated sxs/prior Treatment) Patient is a 81 y.o. female presenting with vomiting. The history is provided by the patient (Patient presents with continued vomiting for the last 2 weeks with abdominal pain).  Emesis Severity:  Moderate Timing:  Constant Quality:  Bilious material Able to tolerate:  Liquids Progression:  Unchanged Chronicity:  Recurrent Recent urination:  Normal Associated symptoms: no abdominal pain, no diarrhea and no headaches     Past Medical History  Diagnosis Date  . Depressive disorder, not elsewhere classified   . Anxiety state, unspecified   . Pure hypercholesterolemia   . Esophageal reflux   . Infection of esophagostomy ([place])   . Congestive heart failure, unspecified     diastolic heart failure  . Pulmonary hypertension (HCC)     PA systolic pressure [id number] mmHg by echocardiogram, PA pressure 33/10 by cardiac catheterization PA saturation 62% thermodilution cardiac index 2.0 thick cardiac index 2.4  . Tricuspid regurgitation     moderate tricuspid regurgitation by echocardiogram, prior use of anorexic agents  . Melanoma ([place])     removal on back   . Coronary artery disease     a. Moderate to severe coronary disease involving the left anterior descending artery and right coronary artery.  Sequential stenosis in the LAD is significant.  Right coronary artery is moderate to severely likely nonischemic. Questionable small LVOT obstruction.  No Brockenbrough maneuver was performed.  Catheterization January 2013; b. 05/2014 MV no isch/infarct, EF 60%.  . Hypertension   . H/O hiatal hernia   . Gastritis   . [name]'s esophagus   . IBS (irritable bowel syndrome)   . Macular degeneration   . Diverticulitis   . Perforated  bowel ([place])   . Anemia   . Atrial fibrillation with RVR ([place])   . Perforated sigmoid colon ([place])   . Cellulitis of extremity   . Pelvic abscess in female   . PONV (postoperative nausea and vomiting)    Past Surgical History  Procedure Laterality Date  . Nissen fundoplication  99991111  . Rotator cuff repair  ~ 2001    right  . Coronary angioplasty with stent placement  05/08/11    "1"  . Tonsillectomy  ~ 1944  . Shoulder arthroscopy  ~ 2004; 2005    left; "joint's wore out"  . Cataract extraction w/ intraocular lens  implant, bilateral  ~ 2002  . Tubal ligation  1972  . Percutaneous coronary stent intervention (pci-s) N/A 05/08/2011    Procedure: PERCUTANEOUS CORONARY STENT INTERVENTION (PCI-S);  Surgeon: Wellington Hampshire, MD;  Location: [facility] CATH LAB;  Service: Cardiovascular;  Laterality: N/A;  . Esophageal manometry N/A 06/06/2014    Procedure: ESOPHAGEAL MANOMETRY (EM);  Surgeon: Jerene Bears, MD;  [facility] ENDOSCOPY;  Service: Gastroenterology;  Laterality: N/A;  . Laparoscopic nissen fundoplication N/A [id number]    Procedure: LAPAROSCOPIC  LYSIS OF ADHESIONS, SEGMENTAL GASTRECTOMY, PARTIAL REDUCTION OF HERNIA;  Surgeon: [name], MD;  Location: [facility] ORS;  Service: General;  Laterality: N/A;  . Hernia repair    . Colectomy with colostomy creation/hartmann procedure N/A 03/05/2015    Procedure: PARTIAL COLECTOMY WITH COLOSTOMY CREATION/HARTMANN PROCEDURE;  Surgeon: Aviva Signs, MD;  Location: [facility] ORS;  Service: General;  Laterality: N/A;  . Cardiac catheterization Left  03/05/2015    Procedure: CENTRAL LINE INSERTION;  Surgeon: Aviva Signs, MD;  Location: [facility] ORS;  Service: General;  Laterality: Left;   Family History  Problem Relation Age of Onset  . Heart attack Father   . Hypertension Father   . Diabetes Mother   . Hypertension Mother   . Colon cancer Neg Hx   . Esophageal cancer Neg Hx   . Rectal cancer Neg Hx   . Stomach cancer Neg Hx   . Skin cancer Brother     melanoma    Social History  Substance Use Topics  . Smoking status: Former Smoker -- 1.00 packs/day for 20 years    Types: Cigarettes    Start date: 10/12/1958    Quit date: 04/26/1988  . Smokeless tobacco: Never Used  . Alcohol Use: No   OB History    No data available     Review of Systems  Constitutional: Negative for appetite change and fatigue.  HENT: Negative for congestion, ear discharge and sinus pressure.   Eyes: Negative for discharge.  Respiratory: Negative for cough.   Cardiovascular: Negative for chest pain.  Gastrointestinal: Positive for vomiting. Negative for abdominal pain and diarrhea.  Genitourinary: Negative for frequency and hematuria.  Musculoskeletal: Negative for back pain.  Skin: Negative for rash.  Neurological: Negative for seizures and headaches.  Psychiatric/Behavioral: Negative for hallucinations.      Allergies  Sulfamethoxazole  Home Medications   Prior to Admission medications   Medication Sig Start Date End Date Taking? Authorizing Provider  albuterol (PROVENTIL HFA;VENTOLIN HFA) 108 (90 Base) MCG/ACT inhaler Inhale 2 puffs into the lungs every 6 (six) hours as needed for wheezing or shortness of breath.    Yes Historical Provider, MD  ALPRAZolam (XANAX) 0.25 MG tablet Take 1 tablet (0.25 mg total) by mouth 2 (two) times daily as needed for anxiety. 05/08/15  Yes Theodis Blaze, MD  citalopram (CELEXA) 40 MG tablet Take 20 mg by mouth at bedtime.    Yes Historical Provider, MD  esomeprazole (NEXIUM) 40 MG capsule Take 40 mg by mouth daily before breakfast.    Yes Historical Provider, MD  ferrous sulfate 325 (65 FE) MG EC tablet Take 325 mg by mouth 3 (three) times daily.    Yes Historical Provider, MD  furosemide (LASIX) 20 MG tablet Take 1 tablet (20 mg total) by mouth daily. 06/19/15  Yes Kinnie Feil, MD  hydrALAZINE (APRESOLINE) 25 MG tablet Take 25 mg by mouth 3 (three) times daily. 07/03/15  Yes Historical Provider, MD  isosorbide mononitrate  (IMDUR) 30 MG 24 hr tablet Take 30 mg by mouth daily. 07/21/15  Yes Historical Provider, MD  losartan (COZAAR) 100 MG tablet Take 1 tablet (100 mg total) by mouth daily. 11/11/12  Yes Herminio Commons, MD  metoCLOPramide (REGLAN) 5 MG tablet Take 1 tablet (5 mg total) by mouth 3 (three) times daily before meals. 06/14/15  Yes Jerene Bears, MD  metoprolol succinate (TOPROL-XL) 25 MG 24 hr tablet Take 25 mg by mouth 2 (two) times daily. 07/21/15  Yes Historical Provider, MD  Multiple Vitamins-Minerals (PRESERVISION AREDS) TABS Take 1 tablet by mouth 2 (two) times daily.    Yes Historical Provider, MD  nitroGLYCERIN (NITROSTAT) 0.4 MG SL tablet Place 0.4 mg under the tongue every 5 (five) minutes as needed for chest pain. Reported on 04/26/2015   Yes Historical Provider, MD  ondansetron (ZOFRAN-ODT) 4 MG disintegrating tablet Take 1 tablet (4 mg total) by mouth every  8 (eight) hours as needed for nausea or vomiting. 08/02/15  Yes Jerene Bears, MD  polyethylene glycol powder (GLYCOLAX/MIRALAX) powder MIX 17 GRAMS IN AT LEAST 8OZ OF WATER/JUICE AND DRINK TWICE DAILY 06/21/15  Yes Jerene Bears, MD  potassium chloride (K-DUR) 10 MEQ tablet Take 1 tablet (10 mEq total) by mouth daily. 06/19/15  Yes Kinnie Feil, MD  Propylene Glycol (SYSTANE BALANCE OP) Place 1-2 drops into both eyes daily as needed (dry eyes). Reported on 04/26/2015   Yes Historical Provider, MD  rosuvastatin (CRESTOR) 10 MG tablet Take 10 mg by mouth at bedtime.    Yes Historical Provider, MD  traZODone (DESYREL) 100 MG tablet Take 100 mg by mouth at bedtime.    Yes Historical Provider, MD  Vitamin D, Ergocalciferol, (DRISDOL) 50000 UNITS CAPS Take 50,000 Units by mouth every Tuesday.    Yes Historical Provider, MD  isosorbide-hydrALAZINE (BIDIL) 20-37.5 MG tablet Take 2 tablets by mouth 3 (three) times daily. Patient not taking: Reported on 08/03/2015 06/19/15   Kinnie Feil, MD  metoprolol tartrate (LOPRESSOR) 25 MG tablet Take 1 tablet (25  mg total) by mouth 2 (two) times daily. Patient not taking: Reported on 08/03/2015 03/14/15   Silver Huguenin Elgergawy, MD   BP 198/79 mmHg  Pulse 56  Temp(Src) 98.4 F (36.9 C) (Oral)  Resp 19  SpO2 99% Physical Exam  Constitutional: She is oriented to person, place, and time. She appears well-developed.  HENT:  Head: Normocephalic.  Eyes: Conjunctivae and EOM are normal. No scleral icterus.  Neck: Neck supple. No thyromegaly present.  Cardiovascular: Normal rate and regular rhythm.  Exam reveals no gallop and no friction rub.   No murmur heard. Pulmonary/Chest: No stridor. She has no wheezes. She has no rales. She exhibits no tenderness.  Abdominal: She exhibits no distension. There is tenderness. There is no rebound.  Mild epigastric tenderness  Musculoskeletal: Normal range of motion. She exhibits no edema.  Lymphadenopathy:    She has no cervical adenopathy.  Neurological: She is oriented to person, place, and time. She exhibits normal muscle tone. Coordination normal.  Skin: No rash noted. No erythema.  Psychiatric: She has a normal mood and affect. Her behavior is normal.    ED Course  Procedures (including critical care time) Labs Review Labs Reviewed  COMPREHENSIVE METABOLIC PANEL - Abnormal; Notable for the following:    Potassium 2.9 (*)    Chloride 97 (*)    Glucose, Bld 113 (*)    Creatinine, Ser 1.28 (*)    ALT 8 (*)    GFR calc non Af Amer 39 (*)    GFR calc Af Amer 45 (*)    Anion gap 16 (*)    All other components within normal limits  URINALYSIS, ROUTINE W REFLEX MICROSCOPIC (NOT AT [facility]) - Abnormal; Notable for the following:    Color, Urine AMBER (*)    APPearance CLOUDY (*)    Hgb urine dipstick LARGE (*)    Ketones, ur 40 (*)    Protein, ur 100 (*)    Leukocytes, UA TRACE (*)    All other components within normal limits  CBC WITH DIFFERENTIAL/PLATELET - Abnormal; Notable for the following:    RBC 5.13 (*)    MCH 25.5 (*)    RDW 16.5 (*)    All other  components within normal limits  URINE MICROSCOPIC-ADD ON - Abnormal; Notable for the following:    Squamous Epithelial / LPF 0-5 (*)  Bacteria, UA FEW (*)    Casts HYALINE CASTS (*)    All other components within normal limits  URINE CULTURE  LIPASE, BLOOD  PREALBUMIN    Imaging Review No results found. I have personally reviewed and evaluated these images and lab results as part of my medical decision-making.   EKG Interpretation None      MDM   Final diagnoses:  Intractable vomiting with nausea, vomiting of unspecified type    Patient with gallstones and vomiting. Also with colon tumor. She will be admitted hydrated with further workup. Surgery will follow patient    Milton Ferguson, MD 08/03/15 1616

## 2015-08-03 NOTE — Progress Notes (Signed)
Patient ID: [name], female   DOB: [birth year], 81 y.o.   MRN: [id number]  Her daughter called my assistant, Jearld Fenton today. We were going to send her to a lab to get her prealbumin checked. Her daughter states that she is concerned that [name] is so weak she may not be able to the make the trip. She continues to be weak and have problems with nausea and vomiting. I instructed my assistant, Jearld Fenton, to tell the daughter that if she was continuing to fail to thrive, she should go to the hospital. I feel she needs a thorough medical evaluation to try to discern why she continues to fail to thrive. It's possible, that it may be from her cholelithiasis although this is somewhat unusual. We would be available to see her, if needed, while in the hospital. She may end up needing TPN to reverse her malnutrition trend prior to having any surgery, depending on the results of the prealbumin.

## 2015-08-04 ENCOUNTER — Encounter (HOSPITAL_COMMUNITY): Payer: Self-pay | Admitting: *Deleted

## 2015-08-04 ENCOUNTER — Encounter (HOSPITAL_COMMUNITY)
Admission: EM | Disposition: A | Discharge status: Home Health | Payer: Self-pay | Source: Home / Self Care | Attending: Internal Medicine

## 2015-08-04 ENCOUNTER — Inpatient Hospital Stay (HOSPITAL_COMMUNITY): Payer: Medicare Other | Admitting: Registered Nurse

## 2015-08-04 DIAGNOSIS — K3189 Other diseases of stomach and duodenum: Secondary | ICD-10-CM

## 2015-08-04 DIAGNOSIS — R933 Abnormal findings on diagnostic imaging of other parts of digestive tract: Secondary | ICD-10-CM

## 2015-08-04 DIAGNOSIS — R1111 Vomiting without nausea: Secondary | ICD-10-CM

## 2015-08-04 DIAGNOSIS — R1110 Vomiting, unspecified: Secondary | ICD-10-CM

## 2015-08-04 DIAGNOSIS — K311 Adult hypertrophic pyloric stenosis: Principal | ICD-10-CM

## 2015-08-04 HISTORY — PX: ESOPHAGOGASTRODUODENOSCOPY (EGD) WITH PROPOFOL: SHX5813

## 2015-08-04 LAB — COMPREHENSIVE METABOLIC PANEL
ALBUMIN: 3.5 g/dL (ref 3.5–5.0)
ALT: 6 U/L — AB (ref 14–54)
ANION GAP: 13 (ref 5–15)
AST: 12 U/L — AB (ref 15–41)
Alkaline Phosphatase: 53 U/L (ref 38–126)
BILIRUBIN TOTAL: 0.8 mg/dL (ref 0.3–1.2)
BUN: 12 mg/dL (ref 6–20)
CO2: 22 mmol/L (ref 22–32)
Calcium: 9.1 mg/dL (ref 8.9–10.3)
Chloride: 103 mmol/L (ref 101–111)
Creatinine, Ser: 1.01 mg/dL — ABNORMAL HIGH (ref 0.44–1.00)
GFR, EST NON AFRICAN AMERICAN: 52 mL/min — AB (ref >60)
Glucose, Bld: 83 mg/dL (ref 65–99)
POTASSIUM: 3.8 mmol/L (ref 3.5–5.1)
Sodium: 138 mmol/L (ref 135–145)
TOTAL PROTEIN: 6 g/dL — AB (ref 6.5–8.1)

## 2015-08-04 LAB — CBC
HEMATOCRIT: 35.4 % — AB (ref 36.0–46.0)
HEMOGLOBIN: 11.5 g/dL — AB (ref 12.0–15.0)
MCH: 25.4 pg — ABNORMAL LOW (ref 26.0–34.0)
MCHC: 32.5 g/dL (ref 30.0–36.0)
MCV: 78.1 fL (ref 78.0–100.0)
Platelets: 219 10*3/uL (ref 150–400)
RBC: 4.53 MIL/uL (ref 3.87–5.11)
RDW: 16.7 % — ABNORMAL HIGH (ref 11.5–15.5)
WBC: 8.2 10*3/uL (ref 4.0–10.5)

## 2015-08-04 LAB — PROTIME-INR
INR: 1.19 (ref 0.00–1.49)
Prothrombin Time: 14.8 seconds (ref 11.6–15.2)

## 2015-08-04 LAB — MAGNESIUM: MAGNESIUM: 1.7 mg/dL (ref 1.7–2.4)

## 2015-08-04 SURGERY — ESOPHAGOGASTRODUODENOSCOPY (EGD) WITH PROPOFOL
Anesthesia: Monitor Anesthesia Care

## 2015-08-04 MED ORDER — HYDRALAZINE HCL 20 MG/ML IJ SOLN
5.0000 mg | Freq: Three times a day (TID) | INTRAMUSCULAR | Status: AC
  Administered 2015-08-04 (×3): 5 mg via INTRAVENOUS
  Filled 2015-08-04: qty 0.25
  Filled 2015-08-04 (×2): qty 1

## 2015-08-04 MED ORDER — HYDRALAZINE HCL 20 MG/ML IJ SOLN
5.0000 mg | Freq: Three times a day (TID) | INTRAMUSCULAR | Status: DC
Start: 2015-08-04 — End: 2015-08-04

## 2015-08-04 MED ORDER — LABETALOL HCL 5 MG/ML IV SOLN
5.0000 mg | INTRAVENOUS | Status: DC | PRN
  Administered 2015-08-04 (×2): 5 mg via INTRAVENOUS
  Filled 2015-08-04 (×3): qty 4

## 2015-08-04 MED ORDER — ALPRAZOLAM 0.25 MG PO TABS
0.2500 mg | ORAL_TABLET | Freq: Two times a day (BID) | ORAL | Status: DC | PRN
  Administered 2015-08-04 – 2015-08-08 (×7): 0.25 mg via ORAL
  Filled 2015-08-04 (×8): qty 1

## 2015-08-04 MED ORDER — PROPOFOL 500 MG/50ML IV EMUL
INTRAVENOUS | Status: DC | PRN
  Administered 2015-08-04: 50 ug/kg/min via INTRAVENOUS

## 2015-08-04 MED ORDER — LABETALOL HCL 5 MG/ML IV SOLN
5.0000 mg | INTRAVENOUS | Status: DC | PRN
  Administered 2015-08-04: 5 mg via INTRAVENOUS
  Filled 2015-08-04 (×2): qty 4

## 2015-08-04 MED ORDER — LACTATED RINGERS IV SOLN
INTRAVENOUS | Status: DC | PRN
  Administered 2015-08-04: 12:00:00 via INTRAVENOUS

## 2015-08-04 MED ORDER — LIDOCAINE HCL (CARDIAC) 20 MG/ML IV SOLN
INTRAVENOUS | Status: DC | PRN
  Administered 2015-08-04: 50 mg via INTRAVENOUS

## 2015-08-04 MED ORDER — ENOXAPARIN SODIUM 40 MG/0.4ML ~~LOC~~ SOLN
40.0000 mg | SUBCUTANEOUS | Status: DC
  Administered 2015-08-04: 40 mg via SUBCUTANEOUS
  Filled 2015-08-04 (×2): qty 0.4

## 2015-08-04 MED ORDER — PROPOFOL 10 MG/ML IV BOLUS
INTRAVENOUS | Status: AC
  Filled 2015-08-04: qty 20

## 2015-08-04 MED ORDER — ZOLPIDEM TARTRATE 5 MG PO TABS
5.0000 mg | ORAL_TABLET | Freq: Every evening | ORAL | Status: DC | PRN
  Administered 2015-08-04 – 2015-08-08 (×5): 5 mg via ORAL
  Filled 2015-08-04 (×5): qty 1

## 2015-08-04 MED ORDER — FAMOTIDINE IN NACL 20-0.9 MG/50ML-% IV SOLN
20.0000 mg | Freq: Two times a day (BID) | INTRAVENOUS | Status: DC
  Administered 2015-08-04 – 2015-08-09 (×10): 20 mg via INTRAVENOUS
  Filled 2015-08-04 (×11): qty 50

## 2015-08-04 MED ORDER — PROPOFOL 10 MG/ML IV BOLUS
INTRAVENOUS | Status: DC | PRN
  Administered 2015-08-04: 40 mg via INTRAVENOUS

## 2015-08-04 SURGICAL SUPPLY — 15 items

## 2015-08-04 NOTE — Consult Note (Signed)
Referring Provider: No ref. provider found Primary Care Physician:  Terald Sleeper, PA-C Primary Gastroenterologist:  Dr. Hilarie Fredrickson  Reason for Consultation:  Intractable nausea and vomiting; ?GOO  HPI: [name] is a 81 y.o. female with PMH of PAF not on anticoagulation due to history of gi bleed/anemia, h/o large hiatal hernia, h/o perforated diverticulitis s/p resection and colostomy, CAD disease, etc as listed below.  She developed intractable n/v for the last three weeks, she was seen by general surgery but her symptoms became worse, she presented to [facility] long ED.  Has experienced similar symptoms intermittently over the past several months as well but nothing that last this long.  In the ED, she had multiple episodes of vomiting, no reported hematemesis, denies ab pain, reported colostomy output wnl.  Surgery consulted.  CT scan abdomen and pelvis with contrast performed yesterday showed the following:  IMPRESSION: 1. Re-demonstrated large hiatal hernia - while there is no definitive evidence of gastric volvulus, there is marked fluid distention of the stomach and as such, gastric outlet obstruction is not excluded on the basis of this examination. Clinical correlation is advised. Further evaluation with endoscopy could be performed as clinically indicated. 2. Post left hemi colectomy and end colostomy without evidence of enteric obstruction. 3. Interval removal of left trans gluteal approach percutaneous drainage catheter without evidence of new definable/drainable abdominal or pelvic fluid collection. 4. Unchanged solitary punctate (approximately 4 mm) non-obstructing right-sided renal stone. 5. Interval resolution of left-sided pleural effusion. Residual small to moderate size right-sided effusion has decreased in size in interval though there is persistent partial atelectasis/collapse of the right lower lobe. 6. Atherosclerosis including coronary artery calcifications.    Past  Medical History  Diagnosis Date  . Depressive disorder, not elsewhere classified   . Anxiety state, unspecified   . Pure hypercholesterolemia   . Esophageal reflux   . Infection of esophagostomy ([place])   . Congestive heart failure, unspecified     diastolic heart failure  . Pulmonary hypertension (HCC)     PA systolic pressure [id number] mmHg by echocardiogram, PA pressure 33/10 by cardiac catheterization PA saturation 62% thermodilution cardiac index 2.0 thick cardiac index 2.4  . Tricuspid regurgitation     moderate tricuspid regurgitation by echocardiogram, prior use of anorexic agents  . Melanoma ([place])     removal on back   . Coronary artery disease     a. Moderate to severe coronary disease involving the left anterior descending artery and right coronary artery.  Sequential stenosis in the LAD is significant.  Right coronary artery is moderate to severely likely nonischemic. Questionable small LVOT obstruction.  No Brockenbrough maneuver was performed.  Catheterization January 2013; b. 05/2014 MV no isch/infarct, EF 60%.  . Hypertension   . H/O hiatal hernia   . Gastritis   . [name]'s esophagus   . IBS (irritable bowel syndrome)   . Macular degeneration   . Diverticulitis   . Perforated bowel ([place])   . Anemia   . Atrial fibrillation with RVR ([place])   . Perforated sigmoid colon ([place])   . Cellulitis of extremity   . Pelvic abscess in female   . PONV (postoperative nausea and vomiting)     Past Surgical History  Procedure Laterality Date  . Nissen fundoplication  [id number]  . Rotator cuff repair  ~ 2001    right  . Coronary angioplasty with stent placement  05/08/11    "1"  . Tonsillectomy  ~ 1944  . Shoulder  arthroscopy  ~ 2004; 2005    left; "joint's wore out"  . Cataract extraction w/ intraocular lens  implant, bilateral  ~ 2002  . Tubal ligation  1972  . Percutaneous coronary stent intervention (pci-s) N/A 05/08/2011    Procedure: PERCUTANEOUS CORONARY STENT INTERVENTION (PCI-S);   Surgeon: Wellington Hampshire, MD;  Location: [facility] C[facility] LAB;  Service: Cardiovascular;  Laterality: N/A;  . Esophageal manometry N/A 06/06/2014    Procedure: ESOPHAGEAL MANOMETRY (EM);  Surgeon: Jerene Bears, MD;  Location: [facility] ENDOSCOPY;  Service: Gastroenterology;  Laterality: N/A;  . Laparoscopic nissen fundoplication N/A [id number]    Procedure: LAPAROSCOPIC  LYSIS OF ADHESIONS, SEGMENTAL GASTRECTOMY, PARTIAL REDUCTION OF HERNIA;  Surgeon: [name], MD;  Location: [facility] ORS;  Service: General;  Laterality: N/A;  . Hernia repair    . Colectomy with colostomy creation/hartmann procedure N/A 03/05/2015    Procedure: PARTIAL COLECTOMY WITH COLOSTOMY CREATION/HARTMANN PROCEDURE;  Surgeon: Aviva Signs, MD;  Location: [facility] ORS;  Service: General;  Laterality: N/A;  . Cardiac catheterization Left 03/05/2015    Procedure: CENTRAL LINE INSERTION;  Surgeon: Aviva Signs, MD;  Location: [facility] ORS;  Service: General;  Laterality: Left;    Prior to Admission medications   Medication Sig Start Date End Date Taking? Authorizing Provider  albuterol (PROVENTIL HFA;VENTOLIN HFA) 108 (90 Base) MCG/ACT inhaler Inhale 2 puffs into the lungs every 6 (six) hours as needed for wheezing or shortness of breath.    Yes Historical Provider, MD  ALPRAZolam (XANAX) 0.25 MG tablet Take 1 tablet (0.25 mg total) by mouth 2 (two) times daily as needed for anxiety. 05/08/15  Yes Theodis Blaze, MD  citalopram (CELEXA) 40 MG tablet Take 20 mg by mouth at bedtime.    Yes Historical Provider, MD  esomeprazole (NEXIUM) 40 MG capsule Take 40 mg by mouth daily before breakfast.    Yes Historical Provider, MD  ferrous sulfate 325 (65 FE) MG EC tablet Take 325 mg by mouth 3 (three) times daily.    Yes Historical Provider, MD  furosemide (LASIX) 20 MG tablet Take 1 tablet (20 mg total) by mouth daily. 06/19/15  Yes Kinnie Feil, MD  hydrALAZINE (APRESOLINE) 25 MG tablet Take 25 mg by mouth 3 (three) times daily. 07/03/15  Yes Historical Provider, MD    isosorbide mononitrate (IMDUR) 30 MG 24 hr tablet Take 30 mg by mouth daily. 07/21/15  Yes Historical Provider, MD  losartan (COZAAR) 100 MG tablet Take 1 tablet (100 mg total) by mouth daily. 11/11/12  Yes Herminio Commons, MD  metoCLOPramide (REGLAN) 5 MG tablet Take 1 tablet (5 mg total) by mouth 3 (three) times daily before meals. 06/14/15  Yes Jerene Bears, MD  metoprolol succinate (TOPROL-XL) 25 MG 24 hr tablet Take 25 mg by mouth 2 (two) times daily. 07/21/15  Yes Historical Provider, MD  Multiple Vitamins-Minerals (PRESERVISION AREDS) TABS Take 1 tablet by mouth 2 (two) times daily.    Yes Historical Provider, MD  nitroGLYCERIN (NITROSTAT) 0.4 MG SL tablet Place 0.4 mg under the tongue every 5 (five) minutes as needed for chest pain. Reported on 04/26/2015   Yes Historical Provider, MD  ondansetron (ZOFRAN-ODT) 4 MG disintegrating tablet Take 1 tablet (4 mg total) by mouth every 8 (eight) hours as needed for nausea or vomiting. 08/02/15  Yes Jerene Bears, MD  polyethylene glycol powder (GLYCOLAX/MIRALAX) powder MIX 17 GRAMS IN AT LEAST 8OZ OF WATER/JUICE AND DRINK TWICE DAILY 06/21/15  Yes Jerene Bears, MD  potassium chloride (K-DUR) 10 MEQ tablet Take 1 tablet (10 mEq total) by mouth daily. 06/19/15  Yes Kinnie Feil, MD  Propylene Glycol (SYSTANE BALANCE OP) Place 1-2 drops into both eyes daily as needed (dry eyes). Reported on 04/26/2015   Yes Historical Provider, MD  rosuvastatin (CRESTOR) 10 MG tablet Take 10 mg by mouth at bedtime.    Yes Historical Provider, MD  traZODone (DESYREL) 100 MG tablet Take 100 mg by mouth at bedtime.    Yes Historical Provider, MD  Vitamin D, Ergocalciferol, (DRISDOL) 50000 UNITS CAPS Take 50,000 Units by mouth every Tuesday.    Yes Historical Provider, MD  isosorbide-hydrALAZINE (BIDIL) 20-37.5 MG tablet Take 2 tablets by mouth 3 (three) times daily. Patient not taking: Reported on 08/03/2015 06/19/15   Kinnie Feil, MD  metoprolol tartrate (LOPRESSOR) 25 MG  tablet Take 1 tablet (25 mg total) by mouth 2 (two) times daily. Patient not taking: Reported on 08/03/2015 03/14/15   Albertine Patricia, MD    Current Facility-Administered Medications  Medication Dose Route Frequency Provider Last Rate Last Dose  . 0.9 % NaCl with KCl 20 mEq/ L  infusion   Intravenous Continuous Florencia Reasons, MD 75 mL/hr at 08/04/15 0801    . ALPRAZolam Duanne Moron) tablet 0.25 mg  0.25 mg Oral BID PRN Florencia Reasons, MD   0.25 mg at 08/04/15 0801  . enoxaparin (LOVENOX) injection 30 mg  30 mg Subcutaneous Q24H Florencia Reasons, MD   30 mg at 08/03/15 2115  . hydrALAZINE (APRESOLINE) injection 10 mg  10 mg Intravenous Q8H PRN Florencia Reasons, MD   10 mg at 08/04/15 0401  . hydrALAZINE (APRESOLINE) injection 5 mg  5 mg Intravenous Q8H Florencia Reasons, MD      . nitroGLYCERIN (NITROSTAT) SL tablet 0.4 mg  0.4 mg Sublingual Q5 min PRN Florencia Reasons, MD      . ondansetron [facility]) injection 4 mg  4 mg Intravenous Q8H PRN Florencia Reasons, MD   4 mg at 08/04/15 0733  . prochlorperazine (COMPAZINE) injection 5 mg  5 mg Intravenous Q6H PRN Florencia Reasons, MD      . sodium chloride flush (NS) 0.9 % injection 3 mL  3 mL Intravenous Q12H Florencia Reasons, MD   3 mL at 08/03/15 2117    Allergies as of 08/03/2015 - Review Complete 08/03/2015  Allergen Reaction Noted  . Sulfamethoxazole Rash 08/05/2006    Family History  Problem Relation Age of Onset  . Heart attack Father   . Hypertension Father   . Diabetes Mother   . Hypertension Mother   . Colon cancer Neg Hx   . Esophageal cancer Neg Hx   . Rectal cancer Neg Hx   . Stomach cancer Neg Hx   . Skin cancer Brother     melanoma    Social History   Social History  . Marital Status: Divorced    Spouse Name: N/A  . Number of Children: 3  . Years of Education: N/A   Occupational History  . Retired    Social History Main Topics  . Smoking status: Former Smoker -- 1.00 packs/day for 20 years    Types: Cigarettes    Start date: 10/12/1958    Quit date: 04/26/1988  . Smokeless tobacco:  Never Used  . Alcohol Use: No  . Drug Use: No  . Sexual Activity: No   Other Topics Concern  . Not on file   Social History Narrative   Divorced.  Lives alone.  Ambulates with a walker.     Review of Systems: Ten point ROS is O/W negative except as mentioned in HPI.  Physical Exam: Vital signs in last 24 hours: Temp:  [98.1 F (36.7 C)-98.9 F (37.2 C)] 98.1 F (36.7 C) (05/05 0400) Pulse Rate:  [52-69] 65 (05/05 0400) Resp:  [14-23] 18 (05/05 0400) BP: (172-218)/(58-98) 180/80 mmHg (05/05 0441) SpO2:  [95 %-100 %] 98 % (05/05 0400) Weight:  [110 lb 3.7 oz (50 kg)] 110 lb 3.7 oz (50 kg) (05/04 1930) Last BM Date: 08/02/15 General:  Alert,  Well-developed, well-nourished, pleasant and cooperative in NAD Head:  Normocephalic and atraumatic. Eyes:  Sclera clear, no icterus.  Conjunctiva pink. Ears:  Normal auditory acuity. Mouth:  No deformity or lesions.   Lungs:  Clear throughout to auscultation.  No wheezes, crackles, or rhonchi. Heart:  Regular rate and rhythm; no murmurs, clicks, rubs, or gallops. Abdomen:  Soft, non-distended.  BS present.  Non-tender.  Rectal:  Deferred.  Msk:  Symmetrical without gross deformities. Pulses:  Normal pulses noted. Extremities:  Without clubbing or edema. Neurologic:  Alert and oriented x 4;  grossly normal neurologically. Skin:  Intact without significant lesions or rashes. Psych:  Alert and cooperative. Normal mood and affect.  Intake/Output from previous day: 05/04 0701 - 05/05 0700 In: 1202.5 [P.O.:240; I.V.:862.5; IV Piggyback:100] Out: 50 [Stool:50]  Lab Results:  Recent Labs  08/03/15 1358 08/04/15 0443  WBC 10.1 8.2  HGB 13.1 11.5*  HCT 40.9 35.4*  PLT 266 219   BMET  Recent Labs  08/03/15 1359 08/04/15 0443  NA 137 138  K 2.9* 3.8  CL 97* 103  CO2 24 22  GLUCOSE 113* 83  BUN 14 12  CREATININE 1.28* 1.01*  CALCIUM 9.6 9.1   LFT  Recent Labs  08/04/15 0443  PROT 6.0*  ALBUMIN 3.5  AST 12*  ALT  6*  ALKPHOS 53  BILITOT 0.8   PT/INR  Recent Labs  08/04/15 0443  LABPROT 14.8  INR 1.19   Studies/Results: Dg Chest 2 View  08/03/2015  CLINICAL DATA:  Vomiting EXAM: CHEST  2 VIEW COMPARISON:  06/16/2015 FINDINGS: Cardiac shadow it is stable. Right basilar infiltrate with associated effusion is noted. The overall appearance is stable from the prior exam. No left pleural effusion is seen. The left lung is clear. Old healing right rib fractures are noted. IMPRESSION: Right basilar infiltrate with associated effusion. Healing right rib fractures. Electronically Signed   By: Inez Catalina M.D.   On: 08/03/2015 16:59   Ct Abdomen Pelvis W Contrast  08/03/2015  CLINICAL DATA:  History of colon cancer now with persistent nausea and vomiting. EXAM: CT ABDOMEN AND PELVIS WITH CONTRAST TECHNIQUE: Multidetector CT imaging of the abdomen and pelvis was performed using the standard protocol following bolus administration of intravenous contrast. CONTRAST:  64mL ISOVUE-300 IOPAMIDOL (ISOVUE-300) INJECTION 61% COMPARISON:  Right upper quadrant abdominal ultrasound - 06/29/2015; CT abdomen pelvis -05/18/2015 ; 05/01/2015 FINDINGS: Lower chest: Limited visualization of lower thorax demonstrates resolution of previously noted small to moderate size left-sided effusion. Interval decrease in persistent small to moderate-sized right-sided effusion and associated partial atelectasis/ collapse of the right lower lobe. No focal airspace opacities. Normal heart size. Coronary artery calcifications. No pericardial effusion. Hepatobiliary: Normal hepatic contour. No discrete hepatic lesions. Normal appearance of the gallbladder given degree distention. No radiopaque gallstones. No intra extrahepatic bili duct dilatation. No ascites. Pancreas: Appears normal, however multiple surgical clips are noted about the cranial aspect  of the pancreatic tail. No pancreatic stranding. Spleen: Appears normal. Adrenals/Urinary Tract: There  is symmetric enhancement and excretion of the bilateral kidneys. Re- demonstrated bilateral renal cysts with dominant partially exophytic cyst arising from the superior pole of the left kidney measuring 3.6 cm in diameter and dominant right-sided renal cyst measuring approximately 2.2 cm. Additional right-sided sub cm hypoattenuating lesions are too small to adequately characterize though favored to represent additional renal cysts. There is a punctate (approximately 0.4 cm) nonobstructing stone within the inferior pole the right kidney. No evidence of right-sided nephrolithiasis in this postcontrast examination. No urinary obstruction or perinephric stranding. Stomach/Bowel: Re- demonstrated large sliding hiatal hernia, however there is now marked fluid distention of the stomach. No definitive evidence of volvulus though gastric outlet obstruction could have a similar appearance. No definitive gastric wall thickening. Post left hemicolectomy with colostomy within the left mid hemi abdomen. No evidence of enteric obstruction. No pneumoperitoneum, pneumatosis or portal venous gas. Interval removal of left trans gluteal approach percutaneous drainage catheter without evidence of recurrent pelvic fluid collection. Vascular/Lymphatic: Large amount of eccentric mixed calcified and noncalcified atherosclerotic plaque within a normal caliber abdominal aorta. The major branch vessels of the abdominal aorta appear patent on this non CTA examination. No bulky retroperitoneal, mesenteric, pelvic or inguinal lymphadenopathy. Reproductive: Pelvic organs are normal for age. No discrete adnexal lesion. Small amount of free fluid in the pelvic cul-de-sac. Other: Well-healed midline incision. Musculoskeletal: No acute or aggressive osseous abnormalities. Stigmata of DISH within the thoracic spine. IMPRESSION: 1. Re- demonstrated large hiatal hernia - while there is no definitive evidence of gastric volvulus, there is marked fluid  distention of the stomach and as such, gastric outlet obstruction is not excluded on the basis of this examination. Clinical correlation is advised. Further evaluation with endoscopy could be performed as clinically indicated. 2. Post left hemi colectomy and end colostomy without evidence of enteric obstruction. 3. Interval removal of left trans gluteal approach percutaneous drainage catheter without evidence of new definable/drainable abdominal or pelvic fluid collection. 4. Unchanged solitary punctate (approximately 4 mm) nonobstructing right-sided renal stone. 5. Interval resolution of left-sided pleural effusion. Residual small to moderate size right-sided effusion has decreased in size in interval though there is persistent partial atelectasis/collapse of the right lower lobe. 6. Atherosclerosis including coronary artery calcifications. Electronically Signed   By: Sandi Mariscal M.D.   On: 08/03/2015 18:35   IMPRESSION:  -Intractable nausea and vomiting in a patient with large hiatal hernia:  CT scan shows suspicion of GOO.  PLAN: -EGD today.  ? PEG for tethering to prevent recurrence at some point (?by IR).  ZEHR, JESSICA D.  08/04/2015, 9:15 AM  Pager number [phone]      Attending physician's note   I have taken a history, examined the patient and reviewed the chart. I agree with the Advanced Practitioner's note, impression and recommendations. Patient is multiple commorbities and ?cecal malignancy vs advanced adenoma, large hiatal hernia with intractable nausea and vomiting with findings on CT suggestive of gastric outlet obstruction and possible volvulus. Will proceed with EGD to evaluate and possible reduction of the gastric voluvulus if present.   Damaris Hippo, MD [phone] Mon-Fri 8a-5p [phone] after 5p, weekends, holidays

## 2015-08-04 NOTE — Anesthesia Preprocedure Evaluation (Signed)
Anesthesia Evaluation  Patient identified by MRN, date of birth, ID band Patient awake    Reviewed: Allergy & Precautions, NPO status , Patient's Chart, lab work & pertinent test results, reviewed documented beta blocker date and time   History of Anesthesia Complications (+) PONV and history of anesthetic complications  Airway Mallampati: I  TM Distance: >3 FB Neck ROM: Full    Dental  (+) Partial Lower   Pulmonary former smoker,    breath sounds clear to auscultation       Cardiovascular hypertension, Pt. on medications and Pt. on home beta blockers (-) angina+ CAD, + Cardiac Stents and +CHF   Rhythm:Regular     Neuro/Psych PSYCHIATRIC DISORDERS Anxiety Depression negative neurological ROS     GI/Hepatic Neg liver ROS, hiatal hernia, GERD  Medicated,  Endo/Other  negative endocrine ROS  Renal/GU CRFRenal disease     Musculoskeletal   Abdominal   Peds  Hematology  (+) anemia ,   Anesthesia Other Findings   Reproductive/Obstetrics                             Anesthesia Physical Anesthesia Plan  ASA: III  Anesthesia Plan: MAC   Post-op Pain Management:    Induction: Intravenous  Airway Management Planned: Natural Airway, Nasal Cannula and Simple Face Mask  Additional Equipment: None  Intra-op Plan:   Post-operative Plan:   Informed Consent: I have reviewed the patients History and Physical, chart, labs and discussed the procedure including the risks, benefits and alternatives for the proposed anesthesia with the patient or authorized representative who has indicated his/her understanding and acceptance.   Dental advisory given  Plan Discussed with: CRNA and Surgeon  Anesthesia Plan Comments:         Anesthesia Quick Evaluation

## 2015-08-04 NOTE — Op Note (Signed)
[facility] Patient Name: [name] Procedure Date: 08/04/2015 MRN: [id number] Attending MD: Mauri Pole , MD Date of Birth: 23-Jan-1935 CSN: [id number] Age: 81 Admit Type: Inpatient Procedure:                Upper GI endoscopy Indications:              Persistent vomiting of unknown cause, Abnormal CT                            of the GI tract Providers:                Mauri Pole, MD, Tory Emerald, RN, Cherylynn Ridges, Technician, Enrigue Catena, CRNA Referring MD:              Medicines:                Monitored Anesthesia Care Complications:            No immediate complications. Estimated Blood Loss:     Estimated blood loss: none. Procedure:                Pre-Anesthesia Assessment:                           - Prior to the procedure, a History and Physical                            was performed, and patient medications and                            allergies were reviewed. The patient's tolerance of                            previous anesthesia was also reviewed. The risks                            and benefits of the procedure and the sedation                            options and risks were discussed with the patient.                            All questions were answered, and informed consent                            was obtained. Prior Anticoagulants: The patient has                            taken no previous anticoagulant or antiplatelet                            agents. ASA Grade Assessment: III - A patient with  severe systemic disease. After reviewing the risks                            and benefits, the patient was deemed in                            satisfactory condition to undergo the procedure.                           After obtaining informed consent, the endoscope was                            passed under direct vision. Throughout the                            procedure,  the patient's blood pressure, pulse, and                            oxygen saturations were monitored continuously. The                            Endoscope was introduced through the mouth, and                            advanced to the third part of duodenum. The upper                            GI endoscopy was somewhat difficult due to presence                            of bezoar. The patient tolerated the procedure well. Scope In: Scope Out: Findings:      LA Grade D (one or more mucosal breaks involving at least 75% of       esophageal circumference) esophagitis with no bleeding was found 25 to       32 cm from the incisors. Squamo columnar junction at 32cm.      A large amount of food (residue) was found in the gastric fundus and in       the gastric body.      Gastric volvulus was present in the gastric antrum, just above hiatus at       45cm. Antrum and pylorus appeared normal      The first portion of the duodenum and second portion of the duodenum       were normal. Impression:               - LA Grade D reflux and erosive esophagitis.                           - A large amount of food (residue)/phytobezoar in                            the stomach.                           - Gastric volvulus.                           -  Normal first portion of the duodenum and second                            portion of the duodenum.                           - No specimens collected. Moderate Sedation:      N/A- Per Anesthesia Care Recommendation:           - NPO.                           - Continue present medications.                           -Discussed above findings with Dr Zella Richer                           -Will sign off, but available for any questions Procedure Code(s):        --- Professional ---                           [phone], Esophagogastroduodenoscopy, flexible,                            transoral; diagnostic, including collection of                             specimen(s) by brushing or washing, when performed                            (separate procedure) Diagnosis Code(s):        --- Professional ---                           K21.0, Gastro-esophageal reflux disease with                            esophagitis                           K20.8, Other esophagitis                           K31.89, Other diseases of stomach and duodenum                           R11.10, Vomiting, unspecified                           R93.3, Abnormal findings on diagnostic imaging of                            other parts of digestive tract CPT copyright 2016 [employer]. All rights reserved. The codes documented in this report are preliminary and upon coder review may  be revised to meet current compliance requirements. Mauri Pole, MD 08/04/2015 12:15:42 PM This report has been signed electronically. Number of Addenda: 0

## 2015-08-04 NOTE — Transfer of Care (Signed)
Immediate Anesthesia Transfer of Care Note  Patient: Akansha I Gut  Procedure(s) Performed: Procedure(s): ESOPHAGOGASTRODUODENOSCOPY (EGD) WITH PROPOFOL (N/A) PERCUTANEOUS ENDOSCOPIC GASTROSTOMY (PEG) PLACEMENT  Patient Location: PACU  Anesthesia Type:MAC  Level of Consciousness:  sedated, patient cooperative and responds to stimulation  Airway & Oxygen Therapy:Patient Spontanous Breathing and Patient connected to face mask oxgen  Post-op Assessment:  Report given to PACU RN and Post -op Vital signs reviewed and stable  Post vital signs:  Reviewed and stable  Last Vitals:  Filed Vitals:   08/04/15 1200 08/04/15 1202  BP: 163/81 163/81  Pulse:  81  Temp:  36.7 C  Resp:  25    Complications: No apparent anesthesia complications

## 2015-08-04 NOTE — Progress Notes (Signed)
Subjective: Less pain.  Objective: Vital signs in last 24 hours: Temp:  [98.1 F (36.7 C)-98.9 F (37.2 C)] 98.1 F (36.7 C) (05/05 0400) Pulse Rate:  [52-69] 65 (05/05 0400) Resp:  [14-23] 18 (05/05 0400) BP: (172-218)/(58-98) 180/80 mmHg (05/05 0441) SpO2:  [95 %-100 %] 98 % (05/05 0400) Weight:  [50 kg (110 lb 3.7 oz)] 50 kg (110 lb 3.7 oz) (05/04 1930) Last BM Date: 08/02/15  Intake/Output from previous day: 05/04 0701 - 05/05 0700 In: 1202.5 [P.O.:240; I.V.:862.5; IV Piggyback:100] Out: 50 [Stool:50] Intake/Output this shift:    PE: General- In NAD Abdomen-soft, not tender  Lab Results:   Recent Labs  08/03/15 1358 08/04/15 0443  WBC 10.1 8.2  HGB 13.1 11.5*  HCT 40.9 35.4*  PLT 266 219   BMET  Recent Labs  08/03/15 1359 08/04/15 0443  NA 137 138  K 2.9* 3.8  CL 97* 103  CO2 24 22  GLUCOSE 113* 83  BUN 14 12  CREATININE 1.28* 1.01*  CALCIUM 9.6 9.1   PT/INR  Recent Labs  08/04/15 0443  LABPROT 14.8  INR 1.19   Comprehensive Metabolic Panel:    Component Value Date/Time   NA 138 08/04/2015 0443   NA 137 08/03/2015 1359   K 3.8 08/04/2015 0443   K 2.9* 08/03/2015 1359   CL 103 08/04/2015 0443   CL 97* 08/03/2015 1359   CO2 22 08/04/2015 0443   CO2 24 08/03/2015 1359   BUN 12 08/04/2015 0443   BUN 14 08/03/2015 1359   CREATININE 1.01* 08/04/2015 0443   CREATININE 1.28* 08/03/2015 1359   GLUCOSE 83 08/04/2015 0443   GLUCOSE 113* 08/03/2015 1359   CALCIUM 9.1 08/04/2015 0443   CALCIUM 9.6 08/03/2015 1359   AST 12* 08/04/2015 0443   AST 17 08/03/2015 1359   ALT 6* 08/04/2015 0443   ALT 8* 08/03/2015 1359   ALKPHOS 53 08/04/2015 0443   ALKPHOS 64 08/03/2015 1359   BILITOT 0.8 08/04/2015 0443   BILITOT 1.1 08/03/2015 1359   PROT 6.0* 08/04/2015 0443   PROT 6.8 08/03/2015 1359   ALBUMIN 3.5 08/04/2015 0443   ALBUMIN 4.2 08/03/2015 1359     Studies/Results: Dg Chest 2 View  08/03/2015  CLINICAL DATA:  Vomiting EXAM: CHEST   2 VIEW COMPARISON:  06/16/2015 FINDINGS: Cardiac shadow it is stable. Right basilar infiltrate with associated effusion is noted. The overall appearance is stable from the prior exam. No left pleural effusion is seen. The left lung is clear. Old healing right rib fractures are noted. IMPRESSION: Right basilar infiltrate with associated effusion. Healing right rib fractures. Electronically Signed   By: Inez Catalina M.D.   On: 08/03/2015 16:59   Ct Abdomen Pelvis W Contrast  08/03/2015  CLINICAL DATA:  History of colon cancer now with persistent nausea and vomiting. EXAM: CT ABDOMEN AND PELVIS WITH CONTRAST TECHNIQUE: Multidetector CT imaging of the abdomen and pelvis was performed using the standard protocol following bolus administration of intravenous contrast. CONTRAST:  18mL ISOVUE-300 IOPAMIDOL (ISOVUE-300) INJECTION 61% COMPARISON:  Right upper quadrant abdominal ultrasound - 06/29/2015; CT abdomen pelvis -05/18/2015 ; 05/01/2015 FINDINGS: Lower chest: Limited visualization of lower thorax demonstrates resolution of previously noted small to moderate size left-sided effusion. Interval decrease in persistent small to moderate-sized right-sided effusion and associated partial atelectasis/ collapse of the right lower lobe. No focal airspace opacities. Normal heart size. Coronary artery calcifications. No pericardial effusion. Hepatobiliary: Normal hepatic contour. No discrete hepatic lesions. Normal appearance of the gallbladder  given degree distention. No radiopaque gallstones. No intra extrahepatic bili duct dilatation. No ascites. Pancreas: Appears normal, however multiple surgical clips are noted about the cranial aspect of the pancreatic tail. No pancreatic stranding. Spleen: Appears normal. Adrenals/Urinary Tract: There is symmetric enhancement and excretion of the bilateral kidneys. Re- demonstrated bilateral renal cysts with dominant partially exophytic cyst arising from the superior pole of the left  kidney measuring 3.6 cm in diameter and dominant right-sided renal cyst measuring approximately 2.2 cm. Additional right-sided sub cm hypoattenuating lesions are too small to adequately characterize though favored to represent additional renal cysts. There is a punctate (approximately 0.4 cm) nonobstructing stone within the inferior pole the right kidney. No evidence of right-sided nephrolithiasis in this postcontrast examination. No urinary obstruction or perinephric stranding. Stomach/Bowel: Re- demonstrated large sliding hiatal hernia, however there is now marked fluid distention of the stomach. No definitive evidence of volvulus though gastric outlet obstruction could have a similar appearance. No definitive gastric wall thickening. Post left hemicolectomy with colostomy within the left mid hemi abdomen. No evidence of enteric obstruction. No pneumoperitoneum, pneumatosis or portal venous gas. Interval removal of left trans gluteal approach percutaneous drainage catheter without evidence of recurrent pelvic fluid collection. Vascular/Lymphatic: Large amount of eccentric mixed calcified and noncalcified atherosclerotic plaque within a normal caliber abdominal aorta. The major branch vessels of the abdominal aorta appear patent on this non CTA examination. No bulky retroperitoneal, mesenteric, pelvic or inguinal lymphadenopathy. Reproductive: Pelvic organs are normal for age. No discrete adnexal lesion. Small amount of free fluid in the pelvic cul-de-sac. Other: Well-healed midline incision. Musculoskeletal: No acute or aggressive osseous abnormalities. Stigmata of DISH within the thoracic spine. IMPRESSION: 1. Re- demonstrated large hiatal hernia - while there is no definitive evidence of gastric volvulus, there is marked fluid distention of the stomach and as such, gastric outlet obstruction is not excluded on the basis of this examination. Clinical correlation is advised. Further evaluation with endoscopy could  be performed as clinically indicated. 2. Post left hemi colectomy and end colostomy without evidence of enteric obstruction. 3. Interval removal of left trans gluteal approach percutaneous drainage catheter without evidence of new definable/drainable abdominal or pelvic fluid collection. 4. Unchanged solitary punctate (approximately 4 mm) nonobstructing right-sided renal stone. 5. Interval resolution of left-sided pleural effusion. Residual small to moderate size right-sided effusion has decreased in size in interval though there is persistent partial atelectasis/collapse of the right lower lobe. 6. Atherosclerosis including coronary artery calcifications. Electronically Signed   By: Sandi Mariscal M.D.   On: 08/03/2015 18:35    Anti-infectives: Anti-infectives    None      Assessment Active Problems:   N&V (nausea and vomiting)-suspicion for gastric outlet obstruction on CT; chronic, recurrent hiatal hernia that was not reducible via an abdominal approach    PC malnutrition-not as bad as I expected with Prealbumin 15.6    LOS: 1 day   Plan: Recommend GI consult for upper endoscopy (Dr. Hilarie Fredrickson is her gastroenterologist).  If GOO is due to a gastric volvulus, she would benefit from detorsion and PEG.   [name] 08/04/2015

## 2015-08-04 NOTE — Anesthesia Postprocedure Evaluation (Signed)
Anesthesia Post Note  Patient: Aprile I [place]  Procedure(s) Performed: Procedure(s) (LRB): ESOPHAGOGASTRODUODENOSCOPY (EGD) WITH PROPOFOL (N/A)  Patient location during evaluation: Endoscopy Anesthesia Type: MAC Level of consciousness: awake Pain management: pain level controlled Vital Signs Assessment: post-procedure vital signs reviewed and stable Respiratory status: spontaneous breathing Cardiovascular status: stable Postop Assessment: no signs of nausea or vomiting Anesthetic complications: no    Last Vitals:  Filed Vitals:   08/04/15 1245 08/04/15 1303  BP:  165/77  Pulse: 66 61  Temp:  37.3 C  Resp: 18 18    Last Pain:  Filed Vitals:   08/04/15 1303  PainSc: 0-No pain                 Bianka Liberati

## 2015-08-04 NOTE — Progress Notes (Signed)
Initial Nutrition Assessment  DOCUMENTATION CODES:   Severe malnutrition in context of chronic illness  INTERVENTION:  -RD continue to monitor -Pt declined ONS  NUTRITION DIAGNOSIS:   Malnutrition related to chronic illness as evidenced by severe depletion of muscle mass, severe depletion of body fat.  GOAL:   Patient will meet greater than or equal to 90% of their needs  MONITOR:   PO intake, I & O's, Labs  REASON FOR ASSESSMENT:   Malnutrition Screening Tool, Consult Assessment of nutrition requirement/status  ASSESSMENT:   [name] is a 81 yo female H/o PAF , not on anticoagulation due to gi bleed/anemia, actively followed by cardiology, h/o larger hiatal hernia, h/o perforated diverticulitis s/p resection and colostomy, h/o right colon mass under evaluation for surgical resection who developed intractable n/v for the last three weeks, she was seen by gi and general surgery but her symptoms became worse, she presented to [facility] long ED  Spoke with [name] at bedside. She is a pleasant 81 yo lady who unfortunately has been dealing with intractable nausea/vomiting for the last 3 weeks PTA. In addition, she states that she hasn't felt good since December. During this time span she has seen her weight drop from 153# - 110, a 43#/28% severe wt loss for that time span.  She underwent an EGD today to determine the cause of her n/v but states that she feels much better in general and was in the process of ordering food during my visit. She stated that this is the first time she has had an appetite since December.  Nutrition-Focused physical exam completed. Findings are severe fat depletion, severe muscle depletion, and no edema.   RD will follow-up for PO intake and snacks/supplements as needed.  Labs: K 3.8 Medications: Zofran PRN  Diet Order:  Diet Heart Room service appropriate?: Yes; Fluid consistency:: Thin  Skin:  Wound (see comment) (MSAD)  Last BM:  5/3  Height:    Ht Readings from Last 1 Encounters:  08/03/15 5\' 2"  (1.575 m)    Weight:   Wt Readings from Last 1 Encounters:  08/03/15 110 lb 3.7 oz (50 kg)    Ideal Body Weight:  50 kg  BMI:  Body mass index is 20.16 kg/(m^2).  Estimated Nutritional Needs:   Kcal:  1250-1500  Protein:  50-60 grams  Fluid:  >/= 1.25L  EDUCATION NEEDS:   No education needs identified at this time  Satira Anis. Heather Feldmeier, MS, RD LDN After Hours/Weekend Pager [phone]

## 2015-08-04 NOTE — Progress Notes (Signed)
PROGRESS NOTE  [name] [id number] DOB: [birth year] DOA: 08/03/2015 PCP: Terald Sleeper, PA-C  HPI/Recap of past 24 hours:  Returned from EGD, feeling much better  Assessment/Plan: Active Problems:   Vomiting   N&V (nausea and vomiting)   Uncontrollable vomiting   Gastric outlet obstruction  Intractable N/V:  with normal colostomy function, lft wnl, no ab pain, ct ab concerning for gastric outlet obstruction, gastric volvulus, npo/ ivf/prn antiemetics EGD on 5/5 confirmed erosive esophagitis and gastric volvulus. General surgery consulted. Keep npo per gi notes.  Hypokalemia: replace k,   HTN: uncontrolled due to not able to keep oral meds down, prn hydralazine for now  H/o PAF, now in NSR, not on anticoagulation due to h/o GI bleed anemia  H/o hiatal hernia, acid reflux, on iv pepcid  H/o colonic mass, general surgery following     DVT prophylaxis: lovenox  Consultants: general surgery, GI  Code Status: full   Family Communication: Patient and daughter in room  Disposition Plan: pending  Procedures:  EGD on 5/5  Antibiotics:  none   Objective: BP 165/77 mmHg  Pulse 61  Temp(Src) 99.1 F (37.3 C) (Oral)  Resp 18  Ht 5\' 2"  (1.575 m)  Wt 50 kg (110 lb 3.7 oz)  BMI 20.16 kg/m2  SpO2 96%  Intake/Output Summary (Last 24 hours) at 08/04/15 1911 Last data filed at 08/04/15 1206  Gross per 24 hour  Intake 1702.5 ml  Output     50 ml  Net 1652.5 ml   Filed Weights   08/03/15 1930  Weight: 50 kg (110 lb 3.7 oz)    Exam:   General:  NAD  Cardiovascular: RRR  Respiratory: CTABL  Abdomen: Soft/ND/NT, positive BS  Musculoskeletal: No Edema  Neuro: aaox3  Data Reviewed: Basic Metabolic Panel:  Recent Labs Lab 08/03/15 1359 08/04/15 0443  NA 137 138  K 2.9* 3.8  CL 97* 103  CO2 24 22  GLUCOSE 113* 83  BUN 14 12  CREATININE 1.28* 1.01*  CALCIUM 9.6 9.1  MG  --  1.7   Liver Function Tests:  Recent Labs Lab  08/03/15 1359 08/04/15 0443  AST 17 12*  ALT 8* 6*  ALKPHOS 64 53  BILITOT 1.1 0.8  PROT 6.8 6.0*  ALBUMIN 4.2 3.5    Recent Labs Lab 08/03/15 1359  LIPASE 18   No results for input(s): AMMONIA in the last 168 hours. CBC:  Recent Labs Lab 08/03/15 1358 08/04/15 0443  WBC 10.1 8.2  NEUTROABS 6.6  --   HGB 13.1 11.5*  HCT 40.9 35.4*  MCV 79.7 78.1  PLT 266 219   Cardiac Enzymes:   No results for input(s): CKTOTAL, CKMB, CKMBINDEX, TROPONINI in the last 168 hours. BNP (last 3 results)  Recent Labs  06/16/15 2233 06/19/15 0555  BNP 1250.0* 217.0*    ProBNP (last 3 results) No results for input(s): PROBNP in the last 8760 hours.  CBG: No results for input(s): GLUCAP in the last 168 hours.  Recent Results (from the past 240 hour(s))  Urine culture     Status: None (Preliminary result)   Collection Time: 08/03/15  2:03 PM  Result Value Ref Range Status   Specimen Description URINE, CLEAN CATCH  Final   Special Requests NONE  Final   Culture   Final    TOO YOUNG TO READ Performed at [facility]    Report Status PENDING  Incomplete     Studies: No results found.  Scheduled Meds: . enoxaparin (LOVENOX) injection  40 mg Subcutaneous Q24H  . famotidine (PEPCID) IV  20 mg Intravenous Q12H  . hydrALAZINE  5 mg Intravenous Q8H  . sodium chloride flush  3 mL Intravenous Q12H    Continuous Infusions: . 0.9 % NaCl with KCl 20 mEq / L 75 mL/hr at 08/04/15 0801     Time spent: 76mins  Zyren Sevigny MD, PhD  [facility] Pager [phone]. If 7PM-7AM, please contact night-coverage at [email or web address], password [facility] 08/04/2015, 7:11 PM  LOS: 1 day

## 2015-08-05 ENCOUNTER — Inpatient Hospital Stay (HOSPITAL_COMMUNITY): Payer: Medicare Other

## 2015-08-05 ENCOUNTER — Encounter (HOSPITAL_COMMUNITY): Payer: Self-pay | Admitting: Radiology

## 2015-08-05 DIAGNOSIS — I4891 Unspecified atrial fibrillation: Secondary | ICD-10-CM

## 2015-08-05 LAB — CBC
HCT: 37 % (ref 36.0–46.0)
HEMOGLOBIN: 12.1 g/dL (ref 12.0–15.0)
MCH: 25.9 pg — ABNORMAL LOW (ref 26.0–34.0)
MCHC: 32.7 g/dL (ref 30.0–36.0)
MCV: 79.1 fL (ref 78.0–100.0)
PLATELETS: 195 10*3/uL (ref 150–400)
RBC: 4.68 MIL/uL (ref 3.87–5.11)
RDW: 17 % — ABNORMAL HIGH (ref 11.5–15.5)
WBC: 7.5 10*3/uL (ref 4.0–10.5)

## 2015-08-05 LAB — URINE CULTURE

## 2015-08-05 LAB — BASIC METABOLIC PANEL
ANION GAP: 14 (ref 5–15)
BUN: 8 mg/dL (ref 6–20)
CALCIUM: 9.1 mg/dL (ref 8.9–10.3)
CO2: 19 mmol/L — ABNORMAL LOW (ref 22–32)
Chloride: 104 mmol/L (ref 101–111)
Creatinine, Ser: 0.85 mg/dL (ref 0.44–1.00)
GFR calc Af Amer: >60 mL/min (ref >60)
Glucose, Bld: 82 mg/dL (ref 65–99)
POTASSIUM: 3.7 mmol/L (ref 3.5–5.1)
SODIUM: 137 mmol/L (ref 135–145)

## 2015-08-05 LAB — APTT: APTT: 40 s — AB (ref 24–37)

## 2015-08-05 LAB — TSH: TSH: 3.744 u[IU]/mL (ref 0.350–4.500)

## 2015-08-05 LAB — MAGNESIUM: MAGNESIUM: 1.5 mg/dL — AB (ref 1.7–2.4)

## 2015-08-05 MED ORDER — HYDRALAZINE HCL 20 MG/ML IJ SOLN: 20.0000 mg | Freq: Three times a day (TID) | INTRAMUSCULAR | Status: DC | PRN

## 2015-08-05 MED ORDER — POTASSIUM CHLORIDE 10 MEQ/100ML IV SOLN
10.0000 meq | INTRAVENOUS | Status: AC
Start: 2015-08-05 — End: 2015-08-05
  Administered 2015-08-05 (×3): 10 meq via INTRAVENOUS
  Filled 2015-08-05 (×4): qty 100

## 2015-08-05 MED ORDER — MAGNESIUM SULFATE 2 GM/50ML IV SOLN
2.0000 g | Freq: Once | INTRAVENOUS | Status: AC
  Administered 2015-08-05: 2 g via INTRAVENOUS
  Filled 2015-08-05: qty 50

## 2015-08-05 MED ORDER — CEFTRIAXONE SODIUM 1 G IJ SOLR
1.0000 g | INTRAMUSCULAR | Status: DC
  Administered 2015-08-05 – 2015-08-08 (×4): 1 g via INTRAVENOUS
  Filled 2015-08-05 (×4): qty 10

## 2015-08-05 MED ORDER — HEPARIN (PORCINE) IN NACL 100-0.45 UNIT/ML-% IJ SOLN
700.0000 [IU]/h | INTRAMUSCULAR | Status: DC
  Administered 2015-08-05: 700 [IU]/h via INTRAVENOUS
  Filled 2015-08-05: qty 250

## 2015-08-05 MED ORDER — DILTIAZEM LOAD VIA INFUSION
10.0000 mg | Freq: Once | INTRAVENOUS | Status: AC
  Administered 2015-08-05: 10 mg via INTRAVENOUS
  Filled 2015-08-05: qty 10

## 2015-08-05 MED ORDER — HYDRALAZINE HCL 20 MG/ML IJ SOLN
10.0000 mg | Freq: Once | INTRAMUSCULAR | Status: AC
  Administered 2015-08-05: 10 mg via INTRAVENOUS
  Filled 2015-08-05: qty 1

## 2015-08-05 MED ORDER — DEXTROSE 5 % IV SOLN
5.0000 mg/h | INTRAVENOUS | Status: DC
  Administered 2015-08-05 – 2015-08-06 (×3): 5 mg/h via INTRAVENOUS
  Administered 2015-08-07 – 2015-08-08 (×3): 10 mg/h via INTRAVENOUS
  Filled 2015-08-05 (×8): qty 100

## 2015-08-05 MED ORDER — HYDRALAZINE HCL 20 MG/ML IJ SOLN: 10.0000 mg | Freq: Four times a day (QID) | INTRAMUSCULAR | Status: DC | PRN

## 2015-08-05 MED ORDER — HYDRALAZINE HCL 20 MG/ML IJ SOLN
20.0000 mg | Freq: Four times a day (QID) | INTRAMUSCULAR | Status: DC | PRN
  Administered 2015-08-05 – 2015-08-09 (×9): 20 mg via INTRAVENOUS
  Filled 2015-08-05 (×9): qty 1

## 2015-08-05 NOTE — Progress Notes (Signed)
1 Day Post-Op  Subjective: No abdominal pain.  One episode of n/v.  She is aware of EGD findings.  Objective: Vital signs in last 24 hours: Temp:  [98 F (36.7 C)-99.1 F (37.3 C)] 98.8 F (37.1 C) (05/06 0359) Pulse Rate:  [61-146] 146 (05/06 0443) Resp:  [16-25] 24 (05/06 0359) BP: (163-228)/(65-102) 181/96 mmHg (05/06 0443) SpO2:  [95 %-100 %] 97 % (05/06 0359) Weight:  [50.8 kg (111 lb 15.9 oz)] 50.8 kg (111 lb 15.9 oz) (05/06 0359) Last BM Date: 08/04/15  Intake/Output from previous day: 05/05 0701 - 05/06 0700 In: 2387.5 [I.V.:2337.5; IV Piggyback:50] Out: -  Intake/Output this shift:    PE: General- In NAD Abdomen-soft, not tender  Lab Results:   Recent Labs  08/04/15 0443 08/05/15 0513  WBC 8.2 7.5  HGB 11.5* 12.1  HCT 35.4* 37.0  PLT 219 195   BMET  Recent Labs  08/04/15 0443 08/05/15 0513  NA 138 137  K 3.8 3.7  CL 103 104  CO2 22 19*  GLUCOSE 83 82  BUN 12 8  CREATININE 1.01* 0.85  CALCIUM 9.1 9.1   PT/INR  Recent Labs  08/04/15 0443  LABPROT 14.8  INR 1.19   Comprehensive Metabolic Panel:    Component Value Date/Time   NA 137 08/05/2015 0513   NA 138 08/04/2015 0443   K 3.7 08/05/2015 0513   K 3.8 08/04/2015 0443   CL 104 08/05/2015 0513   CL 103 08/04/2015 0443   CO2 19* 08/05/2015 0513   CO2 22 08/04/2015 0443   BUN 8 08/05/2015 0513   BUN 12 08/04/2015 0443   CREATININE 0.85 08/05/2015 0513   CREATININE 1.01* 08/04/2015 0443   GLUCOSE 82 08/05/2015 0513   GLUCOSE 83 08/04/2015 0443   CALCIUM 9.1 08/05/2015 0513   CALCIUM 9.1 08/04/2015 0443   AST 12* 08/04/2015 0443   AST 17 08/03/2015 1359   ALT 6* 08/04/2015 0443   ALT 8* 08/03/2015 1359   ALKPHOS 53 08/04/2015 0443   ALKPHOS 64 08/03/2015 1359   BILITOT 0.8 08/04/2015 0443   BILITOT 1.1 08/03/2015 1359   PROT 6.0* 08/04/2015 0443   PROT 6.8 08/03/2015 1359   ALBUMIN 3.5 08/04/2015 0443   ALBUMIN 4.2 08/03/2015 1359     Studies/Results: Dg Chest 2  View  08/03/2015  CLINICAL DATA:  Vomiting EXAM: CHEST  2 VIEW COMPARISON:  06/16/2015 FINDINGS: Cardiac shadow it is stable. Right basilar infiltrate with associated effusion is noted. The overall appearance is stable from the prior exam. No left pleural effusion is seen. The left lung is clear. Old healing right rib fractures are noted. IMPRESSION: Right basilar infiltrate with associated effusion. Healing right rib fractures. Electronically Signed   By: Inez Catalina M.D.   On: 08/03/2015 16:59   Ct Abdomen Pelvis W Contrast  08/03/2015  CLINICAL DATA:  History of colon cancer now with persistent nausea and vomiting. EXAM: CT ABDOMEN AND PELVIS WITH CONTRAST TECHNIQUE: Multidetector CT imaging of the abdomen and pelvis was performed using the standard protocol following bolus administration of intravenous contrast. CONTRAST:  100mL ISOVUE-300 IOPAMIDOL (ISOVUE-300) INJECTION 61% COMPARISON:  Right upper quadrant abdominal ultrasound - 06/29/2015; CT abdomen pelvis -05/18/2015 ; 05/01/2015 FINDINGS: Lower chest: Limited visualization of lower thorax demonstrates resolution of previously noted small to moderate size left-sided effusion. Interval decrease in persistent small to moderate-sized right-sided effusion and associated partial atelectasis/ collapse of the right lower lobe. No focal airspace opacities. Normal heart size. Coronary artery calcifications.  No pericardial effusion. Hepatobiliary: Normal hepatic contour. No discrete hepatic lesions. Normal appearance of the gallbladder given degree distention. No radiopaque gallstones. No intra extrahepatic bili duct dilatation. No ascites. Pancreas: Appears normal, however multiple surgical clips are noted about the cranial aspect of the pancreatic tail. No pancreatic stranding. Spleen: Appears normal. Adrenals/Urinary Tract: There is symmetric enhancement and excretion of the bilateral kidneys. Re- demonstrated bilateral renal cysts with dominant partially  exophytic cyst arising from the superior pole of the left kidney measuring 3.6 cm in diameter and dominant right-sided renal cyst measuring approximately 2.2 cm. Additional right-sided sub cm hypoattenuating lesions are too small to adequately characterize though favored to represent additional renal cysts. There is a punctate (approximately 0.4 cm) nonobstructing stone within the inferior pole the right kidney. No evidence of right-sided nephrolithiasis in this postcontrast examination. No urinary obstruction or perinephric stranding. Stomach/Bowel: Re- demonstrated large sliding hiatal hernia, however there is now marked fluid distention of the stomach. No definitive evidence of volvulus though gastric outlet obstruction could have a similar appearance. No definitive gastric wall thickening. Post left hemicolectomy with colostomy within the left mid hemi abdomen. No evidence of enteric obstruction. No pneumoperitoneum, pneumatosis or portal venous gas. Interval removal of left trans gluteal approach percutaneous drainage catheter without evidence of recurrent pelvic fluid collection. Vascular/Lymphatic: Large amount of eccentric mixed calcified and noncalcified atherosclerotic plaque within a normal caliber abdominal aorta. The major branch vessels of the abdominal aorta appear patent on this non CTA examination. No bulky retroperitoneal, mesenteric, pelvic or inguinal lymphadenopathy. Reproductive: Pelvic organs are normal for age. No discrete adnexal lesion. Small amount of free fluid in the pelvic cul-de-sac. Other: Well-healed midline incision. Musculoskeletal: No acute or aggressive osseous abnormalities. Stigmata of DISH within the thoracic spine. IMPRESSION: 1. Re- demonstrated large hiatal hernia - while there is no definitive evidence of gastric volvulus, there is marked fluid distention of the stomach and as such, gastric outlet obstruction is not excluded on the basis of this examination. Clinical  correlation is advised. Further evaluation with endoscopy could be performed as clinically indicated. 2. Post left hemi colectomy and end colostomy without evidence of enteric obstruction. 3. Interval removal of left trans gluteal approach percutaneous drainage catheter without evidence of new definable/drainable abdominal or pelvic fluid collection. 4. Unchanged solitary punctate (approximately 4 mm) nonobstructing right-sided renal stone. 5. Interval resolution of left-sided pleural effusion. Residual small to moderate size right-sided effusion has decreased in size in interval though there is persistent partial atelectasis/collapse of the right lower lobe. 6. Atherosclerosis including coronary artery calcifications. Electronically Signed   By: Sandi Mariscal M.D.   On: 08/03/2015 18:35    Anti-infectives: Anti-infectives    Start     Dose/Rate Route Frequency Ordered Stop   08/05/15 0900  cefTRIAXone (ROCEPHIN) 1 g in dextrose 5 % 50 mL IVPB     1 g 100 mL/hr over 30 Minutes Intravenous Every 24 hours 08/05/15 0804        Assessment Active Problems:   N&V (nausea and vomiting) due to gastric volvulus from recurrent hiatal hernia-Dr. Silverio Decamp feels she reduced this; there was food and liquid in stomach; her recurrent hiatal hernia was not reducible via a abdominal approach 11 months ago   Anemia felt to be due to intermittent bleeding from ascending colon tubulovillous adenoma (no high-grade dysplasia or malignancy)  Symptomatic cholelithiasis-has been asx recently  Poorly controlled HTN  Afib  PC malnutrition-not as bad as I expected with Prealbumin 15.6  LOS: 2 days   Plan:  Have consulted cardiothoracic surgery regarding repair of recurrent hiatal hernia via thoracic approach.  Overall, this is a very difficult situation in someone who has had declining health since her sigmoid colon perforation and Hartmann procedure December 2016.    Angeldejesus Callaham J 08/05/2015

## 2015-08-05 NOTE — Progress Notes (Signed)
ANTICOAGULATION CONSULT NOTE - Initial Consult  Pharmacy Consult for Heparin Indication: atrial fibrillation  Allergies  Allergen Reactions  . Sulfamethoxazole Rash    Patient Measurements: Height: 5\' 2"  (157.5 cm) Weight: 111 lb 15.9 oz (50.8 kg) IBW/kg (Calculated) : 50.1 Heparin Dosing Weight: using actual body weight  Vital Signs: Temp: 98.3 F (36.8 C) (05/06 1447) Temp Source: Oral (05/06 1447) BP: 171/73 mmHg (05/06 1612) Pulse Rate: 75 (05/06 1447)  Labs:  Recent Labs  08/03/15 1358 08/03/15 1359 08/04/15 0443 08/05/15 0513  HGB 13.1  --  11.5* 12.1  HCT 40.9  --  35.4* 37.0  PLT 266  --  219 195  LABPROT  --   --  14.8  --   INR  --   --  1.19  --   CREATININE  --  1.28* 1.01* 0.85    Estimated Creatinine Clearance: 43.1 mL/min (by C-G formula based on Cr of 0.85).   Assessment: 65 yoF with history of chronic diastolic HF, afib not on anticoag due to issues with anemia in the past, colostomy, history of colonic mass followed by gen surg, pulm HTN, TR, CAD with prior DES to LAD in 05/2011, and HTN. From clinic notes off ASA as well due to issues with anemia. She was admitted with intractable N/V. Cardiology is consulted for onset of afib with RVR.   This morning, cardiology's recommendation was to continue to hold staring anticoagulation or aspirin due to history of anemia.    This evening, patient reported slurred speech and tongue numbness that lasted ~5 minutes.  Per TRH, given no history of GIB and currently stable CBC (Hgb and Plts WNL), would like to start heparin infusion for afib with high risk of TIA/stroke.  CT head negative for bleed, no acute intracranial abnormalities.  Discussed plan with MD, appears that symptoms have resolved and likely TIA so patient will not be candidate to eval for tPA so will proceed with beginning heparin infusion but will not bolus given concerns with starting an anticoagulant.  Note baseline INR 1.19, baseline aPTT  ordered STAT.  SCr improved since admission.  Goal of Therapy:  Heparin level 0.3-0.7 units/ml Monitor platelets by anticoagulation protocol: Yes   Plan:  Start heparin infusion (without bolus) at 700 units/hr. Check heparin level in 8 hours. Daily CBC and heparin levels while on heparin infusion.  Hershal Coria 08/05/2015,4:29 PM

## 2015-08-05 NOTE — Progress Notes (Signed)
Patient has continued with elevated blood pressure through out the night.  Giving scheduled and prn Hydralazine as ordered.  Around 4am pt's heart rate went up to the 140's and sustained.  EKG performed and showed Afib.  NP on call was notified of rhythm change to Afib and of pt's elevated blood pressure. Orders received to start Cardizem drip and give a bolus.  Will continue to monitor patient

## 2015-08-05 NOTE — Progress Notes (Addendum)
PROGRESS NOTE  [name] [id number] DOB: Jan 01, 1935 DOA: 08/03/2015 PCP: Terald Sleeper, PA-C  HPI/Recap of past 24 hours:  Developed Afib/RVR overnight, now on cardizem drip, now back to NSR,   n/v x1 last 24hrs, denies ab pain, daughter in room.  Assessment/Plan: Active Problems:   Vomiting   N&V (nausea and vomiting)   Uncontrollable vomiting   Gastric outlet obstruction  Intractable N/V/large hiatal hernia/gastic outlet obstruction/gastric volvulus:  with normal colostomy function, lft wnl, no ab pain, ct ab concerning for gastric outlet obstruction, gastric volvulus, npo/ ivf/prn antiemetics EGD on 5/5 confirmed erosive esophagitis and gastric volvulus. General surgery consulted. Keep npo per gi notes. Diet advanced to clears on 5/6 by surgery, will continue ivf for another 24hrs. Cardiothoracic surgery consulted for possible hiatal hernia repair.   Hypokalemia/hypomagnesemia: replace k,mag   Afib/RVR with h/o PAF, not on anticoagulation due to h/o GI bleed anemia. initially in NSR, now in afib/RVR on cardizem drip, cardiology consulted  HTN: uncontrolled due to not able to keep oral meds down, prn hydralazine for now  H/o hiatal hernia, acid reflux, on iv pepcid  H/o colonic mass, general surgery following  UTI? Culture pending, empirically start rocephin   4Pm addendum: patient reported slurred speech and tongue numbness, lasted about 39mins, now almost resolved, STAT CT head ordered. Patient has afib, risk of tia/stroke high, she was not on anticoagulation due to h/o GI bleed. Currently, no overt sign of bleed, hgb stable, will start heparin drip for now, monitor hgb/sign of bleed, further anticoagulation recommendation per cardiology.   DVT prophylaxis: lovenox  Consultants: general surgery, GI, cardiothoracic surgery, cardiology  Code Status: full   Family Communication: Patient and daughter in room  Disposition Plan: pending  Procedures:  EGD on  5/5  Antibiotics:  Rocephin from 5/6   Objective: BP 181/96 mmHg  Pulse 146  Temp(Src) 98.8 F (37.1 C) (Oral)  Resp 24  Ht 5\' 2"  (1.575 m)  Wt 50.8 kg (111 lb 15.9 oz)  BMI 20.48 kg/m2  SpO2 97%  Intake/Output Summary (Last 24 hours) at 08/05/15 0757 Last data filed at 08/05/15 0700  Gross per 24 hour  Intake 2387.5 ml  Output      0 ml  Net 2387.5 ml   Filed Weights   08/03/15 1930 08/04/15 2307 08/05/15 0359  Weight: 50 kg (110 lb 3.7 oz) 50.8 kg (111 lb 15.9 oz) 50.8 kg (111 lb 15.9 oz)    Exam:   General:  NAD  Cardiovascular: RRR  Respiratory: CTABL  Abdomen: Soft/ND/NT, positive BS  Musculoskeletal: No Edema  Neuro: aaox3  Data Reviewed: Basic Metabolic Panel:  Recent Labs Lab 08/03/15 1359 08/04/15 0443 08/05/15 0513  NA 137 138 137  K 2.9* 3.8 3.7  CL 97* 103 104  CO2 24 22 19*  GLUCOSE 113* 83 82  BUN 14 12 8   CREATININE 1.28* 1.01* 0.85  CALCIUM 9.6 9.1 9.1  MG  --  1.7 1.5*   Liver Function Tests:  Recent Labs Lab 08/03/15 1359 08/04/15 0443  AST 17 12*  ALT 8* 6*  ALKPHOS 64 53  BILITOT 1.1 0.8  PROT 6.8 6.0*  ALBUMIN 4.2 3.5    Recent Labs Lab 08/03/15 1359  LIPASE 18   No results for input(s): AMMONIA in the last 168 hours. CBC:  Recent Labs Lab 08/03/15 1358 08/04/15 0443 08/05/15 0513  WBC 10.1 8.2 7.5  NEUTROABS 6.6  --   --   HGB 13.1 11.5*  12.1  HCT 40.9 35.4* 37.0  MCV 79.7 78.1 79.1  PLT 266 219 195   Cardiac Enzymes:   No results for input(s): CKTOTAL, CKMB, CKMBINDEX, TROPONINI in the last 168 hours. BNP (last 3 results)  Recent Labs  06/16/15 2233 06/19/15 0555  BNP 1250.0* 217.0*    ProBNP (last 3 results) No results for input(s): PROBNP in the last 8760 hours.  CBG: No results for input(s): GLUCAP in the last 168 hours.  Recent Results (from the past 240 hour(s))  Urine culture     Status: None (Preliminary result)   Collection Time: 08/03/15  2:03 PM  Result Value Ref Range  Status   Specimen Description URINE, CLEAN CATCH  Final   Special Requests NONE  Final   Culture   Final    TOO YOUNG TO READ Performed at [facility]    Report Status PENDING  Incomplete     Studies: No results found.  Scheduled Meds: . enoxaparin (LOVENOX) injection  40 mg Subcutaneous Q24H  . famotidine (PEPCID) IV  20 mg Intravenous Q12H  . sodium chloride flush  3 mL Intravenous Q12H    Continuous Infusions: . 0.9 % NaCl with KCl 20 mEq / L 75 mL/hr at 08/04/15 2300  . diltiazem (CARDIZEM) infusion 5 mg/hr (08/05/15 0524)     Time spent: 6mins  Mikkel Charrette MD, PhD  [facility] Pager [phone]. If 7PM-7AM, please contact night-coverage at [email or web address], password [facility] 08/05/2015, 7:57 AM  LOS: 2 days

## 2015-08-05 NOTE — Consult Note (Addendum)
Primary cardiologist: Dr Kate Sable Consulting cardiologist: Dr Carlyle Dolly Requesting physician: Dr Florencia Reasons  Reason for consult: Afib RVR  Clinical Summary [name] is a 81 y.o.female with history of chronic diastolic HF, afib not on anticoag due to issues with anemia in the past, colostomy, history of colonic mass followed by gen surg,  pulm HTN, TR, CAD with prior DES to LAD in 05/2011, and HTN. From clinic notes off ASA as well due to issues with anemia. She was admitted with intractable N/V.  Cardiology is consulted for onset of afib with RVR. She reports palpitations this AM that have resolved. No chest pain or SOB.    Mg 1.5, Hgb 12.1, Plt 219, K 3.7, Cr 0.85 (1.28 on admit), TSH 3.7, CXR right basilar infiltrate with associated effusion CT abd/pelvis:  Large hiatal hernia, postleft hemicolectomy.  05/2015 echo: LVEF 65-70%, grade III diastolic dysfunction EKG afib RVR    Allergies  Allergen Reactions  . Sulfamethoxazole Rash    Medications Scheduled Medications: . cefTRIAXone (ROCEPHIN)  IV  1 g Intravenous Q24H  . enoxaparin (LOVENOX) injection  40 mg Subcutaneous Q24H  . famotidine (PEPCID) IV  20 mg Intravenous Q12H  . magnesium sulfate 1 - 4 g bolus IVPB  2 g Intravenous Once  . potassium chloride  10 mEq Intravenous Q1 Hr x 3  . sodium chloride flush  3 mL Intravenous Q12H     Infusions: . 0.9 % NaCl with KCl 20 mEq / L 75 mL/hr at 08/04/15 2300  . diltiazem (CARDIZEM) infusion 5 mg/hr (08/05/15 0524)     PRN Medications:  ALPRAZolam, hydrALAZINE, nitroGLYCERIN, ondansetron (ZOFRAN) IV, prochlorperazine, zolpidem   Past Medical History  Diagnosis Date  . Depressive disorder, not elsewhere classified   . Anxiety state, unspecified   . Pure hypercholesterolemia   . Esophageal reflux   . Infection of esophagostomy ([place])   . Congestive heart failure, unspecified     diastolic heart failure  . Pulmonary hypertension (HCC)     PA systolic  pressure [id number] mmHg by echocardiogram, PA pressure 33/10 by cardiac catheterization PA saturation 62% thermodilution cardiac index 2.0 thick cardiac index 2.4  . Tricuspid regurgitation     moderate tricuspid regurgitation by echocardiogram, prior use of anorexic agents  . Melanoma ([place])     removal on back   . Coronary artery disease     a. Moderate to severe coronary disease involving the left anterior descending artery and right coronary artery.  Sequential stenosis in the LAD is significant.  Right coronary artery is moderate to severely likely nonischemic. Questionable small LVOT obstruction.  No Brockenbrough maneuver was performed.  Catheterization January 2013; b. 05/2014 MV no isch/infarct, EF 60%.  . Hypertension   . H/O hiatal hernia   . Gastritis   . [name]'s esophagus   . IBS (irritable bowel syndrome)   . Macular degeneration   . Diverticulitis   . Perforated bowel ([place])   . Anemia   . Atrial fibrillation with RVR ([place])   . Perforated sigmoid colon ([place])   . Cellulitis of extremity   . Pelvic abscess in female   . PONV (postoperative nausea and vomiting)     Past Surgical History  Procedure Laterality Date  . Nissen fundoplication  99991111  . Rotator cuff repair  ~ 2001    right  . Coronary angioplasty with stent placement  05/08/11    "1"  . Tonsillectomy  ~ 1944  . Shoulder arthroscopy  ~  2004; 2005    left; "joint's wore out"  . Cataract extraction w/ intraocular lens  implant, bilateral  ~ 2002  . Tubal ligation  1972  . Percutaneous coronary stent intervention (pci-s) N/A 05/08/2011    Procedure: PERCUTANEOUS CORONARY STENT INTERVENTION (PCI-S);  Surgeon: Wellington Hampshire, MD;  Location: [facility] CATH LAB;  Service: Cardiovascular;  Laterality: N/A;  . Esophageal manometry N/A 06/06/2014    Procedure: ESOPHAGEAL MANOMETRY (EM);  Surgeon: Jerene Bears, MD;  Location: [facility] ENDOSCOPY;  Service: Gastroenterology;  Laterality: N/A;  . Laparoscopic nissen fundoplication N/A  123456    Procedure: LAPAROSCOPIC  LYSIS OF ADHESIONS, SEGMENTAL GASTRECTOMY, PARTIAL REDUCTION OF HERNIA;  Surgeon: [name], MD;  Location: [facility] ORS;  Service: General;  Laterality: N/A;  . Hernia repair    . Colectomy with colostomy creation/hartmann procedure N/A 03/05/2015    Procedure: PARTIAL COLECTOMY WITH COLOSTOMY CREATION/HARTMANN PROCEDURE;  Surgeon: Aviva Signs, MD;  Location: [facility] ORS;  Service: General;  Laterality: N/A;  . Cardiac catheterization Left 03/05/2015    Procedure: CENTRAL LINE INSERTION;  Surgeon: Aviva Signs, MD;  Location: [facility] ORS;  Service: General;  Laterality: Left;    Family History  Problem Relation Age of Onset  . Heart attack Father   . Hypertension Father   . Diabetes Mother   . Hypertension Mother   . Colon cancer Neg Hx   . Esophageal cancer Neg Hx   . Rectal cancer Neg Hx   . Stomach cancer Neg Hx   . Skin cancer Brother     melanoma    Social History Ms. Dunivan reports that she quit smoking about 27 years ago. Her smoking use included Cigarettes. She started smoking about 56 years ago. She has a 20 pack-year smoking history. She has never used smokeless tobacco. Ms. Guinta reports that she does not drink alcohol.  Review of Systems CONSTITUTIONAL: No weight loss, fever, chills, weakness or fatigue.  HEENT: Eyes: No visual loss, blurred vision, double vision or yellow sclerae. No hearing loss, sneezing, congestion, runny nose or sore throat.  SKIN: No rash or itching.  CARDIOVASCULAR: per HPI RESPIRATORY: No shortness of breath, cough or sputum.  GASTROINTESTINAL: + N/V GENITOURINARY: no polyuria, no dysuria NEUROLOGICAL: No headache, dizziness, syncope, paralysis, ataxia, numbness or tingling in the extremities. No change in bowel or bladder control.  MUSCULOSKELETAL: No muscle, back pain, joint pain or stiffness.  HEMATOLOGIC: No anemia, bleeding or bruising.  LYMPHATICS: No enlarged nodes. No history of splenectomy.  PSYCHIATRIC: No  history of depression or anxiety.      Physical Examination Blood pressure 181/96, pulse 146, temperature 98.8 F (37.1 C), temperature source Oral, resp. rate 24, height 5\' 2"  (1.575 m), weight 111 lb 15.9 oz (50.8 kg), SpO2 97 %.  Intake/Output Summary (Last 24 hours) at 08/05/15 0916 Last data filed at 08/05/15 0700  Gross per 24 hour  Intake 2387.5 ml  Output      0 ml  Net 2387.5 ml    HEENT: sclera clear, throat clear  Cardiovascular: RRR, no m/r/g, no jvd  Respiratory: CTAB  GI: abdomen soft, NT, ND  MSK: no LE edema  Neuro: no focal deficits  Psych: appropriate affect   Lab Results  Basic Metabolic Panel:  Recent Labs Lab 08/03/15 1359 08/04/15 0443 08/05/15 0513  NA 137 138 137  K 2.9* 3.8 3.7  CL 97* 103 104  CO2 24 22 19*  GLUCOSE 113* 83 82  BUN 14 12 8   CREATININE 1.28*  1.01* 0.85  CALCIUM 9.6 9.1 9.1  MG  --  1.7 1.5*    Liver Function Tests:  Recent Labs Lab 08/03/15 1359 08/04/15 0443  AST 17 12*  ALT 8* 6*  ALKPHOS 64 53  BILITOT 1.1 0.8  PROT 6.8 6.0*  ALBUMIN 4.2 3.5    CBC:  Recent Labs Lab 08/03/15 1358 08/04/15 0443 08/05/15 0513  WBC 10.1 8.2 7.5  NEUTROABS 6.6  --   --   HGB 13.1 11.5* 12.1  HCT 40.9 35.4* 37.0  MCV 79.7 78.1 79.1  PLT 266 219 195    Cardiac Enzymes: No results for input(s): CKTOTAL, CKMB, CKMBINDEX, TROPONINI in the last 168 hours.  BNP: Invalid input(s): POCBNP     Imaging 04/2011 Cath Hemodynamics:   RA: 11/8/7 RV: 35/11 PCWP: 14/11/10 PA: 33/10/20  Cardiac Output  Thermodilution: 3.7 Index 2.0 Fick : 4.3 lpm, Index of 2.4  Arterial Sat: 94 % PA Sat: 62%.  LV pressure: 137/23 Aortic pressure: 136/53  Angiography   Left Main: There is moderate to severe calcification in the mid and distal left main. The calcification appears to be extrinsic to the vessel and there is no significant luminal compromise.  Left anterior Descending: The  proximal left anterior descending artery is moderately calcified. There is a 60-70% stenosis at the origin of the left anterior descending artery. This is followed by an he sent her, irregular stenosis of between 70 and 75%. It is very difficult to visualize. The remaining LAD has minor little regular disease. They're several small diagonal branches which have only minor luminal regular disease.  Left Circumflex: The left circumflex artery is a moderate-sized vessel. It gives off a large first obtuse marginal artery which is normal. The second 2 2 artery is moderate-sized artery which is tortuous but is otherwise unremarkable.  Right Coronary Artery: The right coronary artery is heavily calcified throughout its course. We're able to visualize the course of the entire right coronary artery even without contrast. Coronary angiography revealed that the vessel is moderate to large in size and is dominant. There are proximal luminal or right leg is between 20 and 30%. The mid vessel has sequential 40% stenoses. The posterior lateral Aviella Disbrow and posterior descending artery are free of critical disease.  LV Gram: The left ventriculogram was performed in the 30 RAO position. It reveals overall well-preserved left systolic function. There is some question of a mid left ventricular outflow tract obstruction. No gradient was measured at this is just visualize on the left ventricular gram. The overall ejection fraction is normal at 65 and 70%.  Complications: No apparent complications Patient did tolerate procedure well.   Contrast used: 70 cc  Conclusions:  1. Moderate to severe coronary artery disease primarily involving the left anterior descending artery and right coronary artery. The sequential stenosis in the left anterior descending artery circumflex could be ischemic. This will most likely need further evaluation. The right coronary artery is moderate in severity and is likely not to be  ischemic.  2. Normal left ventricular systolic function. The mid LV OT is hyperdynamic and under certain conditions she may have a minute LVOT obstruction. This was not demonstrated during the heart catheterization.  The right heart pressures are at the upper limits of normal. The patient does have an elevated wedge pressure and may need additional diuresis.   Impression/Recommendations  1. AFib with RVR - history of afib, likely rates exacerbated by admit with intractable N/V, likely decreased ability to keep  her meds down - has not been on anticoag due to history of anemia, has not been on ASA either. Will not start at this time - started on dilt gtt, rates now 80s, and she has converted back to NSR - continue dilt gtt, can titrate as needed. Once more stable on PO restart her home beta blocker - keep K at 4 and Mg a 2  2. HTN - elevated, she has not been able to take her oral meds. Follow on dilt gtt, continue prn hydralazine. Can increase to 15mg  IV prn if needed, also can use prn labetalol. By history her N/V is resolving, hopefully can get back on orals in the near future.    Carlyle Dolly, M.D.

## 2015-08-06 ENCOUNTER — Encounter (HOSPITAL_COMMUNITY): Payer: Self-pay | Admitting: Gastroenterology

## 2015-08-06 DIAGNOSIS — G459 Transient cerebral ischemic attack, unspecified: Secondary | ICD-10-CM

## 2015-08-06 DIAGNOSIS — I480 Paroxysmal atrial fibrillation: Secondary | ICD-10-CM

## 2015-08-06 DIAGNOSIS — K440 Diaphragmatic hernia with obstruction, without gangrene: Secondary | ICD-10-CM

## 2015-08-06 LAB — CBC
HCT: 31.5 % — ABNORMAL LOW (ref 36.0–46.0)
HEMOGLOBIN: 10.1 g/dL — AB (ref 12.0–15.0)
MCH: 24.8 pg — AB (ref 26.0–34.0)
MCHC: 32.1 g/dL (ref 30.0–36.0)
MCV: 77.4 fL — ABNORMAL LOW (ref 78.0–100.0)
Platelets: 187 10*3/uL (ref 150–400)
RBC: 4.07 MIL/uL (ref 3.87–5.11)
RDW: 16.8 % — ABNORMAL HIGH (ref 11.5–15.5)
WBC: 6.5 10*3/uL (ref 4.0–10.5)

## 2015-08-06 LAB — BASIC METABOLIC PANEL
Anion gap: 11 (ref 5–15)
BUN: 7 mg/dL (ref 6–20)
CHLORIDE: 101 mmol/L (ref 101–111)
CO2: 16 mmol/L — ABNORMAL LOW (ref 22–32)
Calcium: 8.1 mg/dL — ABNORMAL LOW (ref 8.9–10.3)
Creatinine, Ser: 0.84 mg/dL (ref 0.44–1.00)
GFR calc Af Amer: >60 mL/min (ref >60)
GFR calc non Af Amer: >60 mL/min (ref >60)
Glucose, Bld: 81 mg/dL (ref 65–99)
POTASSIUM: 4.1 mmol/L (ref 3.5–5.1)
Sodium: 128 mmol/L — ABNORMAL LOW (ref 135–145)

## 2015-08-06 LAB — HEPARIN LEVEL (UNFRACTIONATED)
HEPARIN UNFRACTIONATED: 0.42 [IU]/mL (ref 0.30–0.70)
Heparin Unfractionated: 0.16 IU/mL — ABNORMAL LOW (ref 0.30–0.70)
Heparin Unfractionated: 0.45 IU/mL (ref 0.30–0.70)

## 2015-08-06 LAB — PHOSPHORUS: Phosphorus: 2.2 mg/dL — ABNORMAL LOW (ref 2.5–4.6)

## 2015-08-06 LAB — MAGNESIUM: MAGNESIUM: 1.9 mg/dL (ref 1.7–2.4)

## 2015-08-06 LAB — OCCULT BLOOD X 1 CARD TO LAB, STOOL: Fecal Occult Bld: NEGATIVE

## 2015-08-06 LAB — GLUCOSE, CAPILLARY
GLUCOSE-CAPILLARY: 125 mg/dL — AB (ref 65–99)
Glucose-Capillary: 127 mg/dL — ABNORMAL HIGH (ref 65–99)

## 2015-08-06 MED ORDER — POTASSIUM CHLORIDE IN NACL 20-0.9 MEQ/L-% IV SOLN
INTRAVENOUS | Status: AC
  Administered 2015-08-06: 18:00:00 via INTRAVENOUS
  Filled 2015-08-06: qty 1000

## 2015-08-06 MED ORDER — SODIUM PHOSPHATES 45 MMOLE/15ML IV SOLN
15.0000 mmol | Freq: Once | INTRAVENOUS | Status: AC
  Administered 2015-08-06: 15 mmol via INTRAVENOUS
  Filled 2015-08-06: qty 5

## 2015-08-06 MED ORDER — INSULIN ASPART 100 UNIT/ML ~~LOC~~ SOLN
0.0000 [IU] | Freq: Four times a day (QID) | SUBCUTANEOUS | Status: DC
  Administered 2015-08-06 – 2015-08-08 (×5): 1 [IU] via SUBCUTANEOUS

## 2015-08-06 MED ORDER — TRACE MINERALS CR-CU-MN-SE-ZN 10-1000-500-60 MCG/ML IV SOLN
INTRAVENOUS | Status: AC
  Administered 2015-08-06: 18:00:00 via INTRAVENOUS
  Filled 2015-08-06: qty 960

## 2015-08-06 MED ORDER — HEPARIN (PORCINE) IN NACL 100-0.45 UNIT/ML-% IJ SOLN
850.0000 [IU]/h | INTRAMUSCULAR | Status: AC
  Administered 2015-08-06 – 2015-08-08 (×2): 850 [IU]/h via INTRAVENOUS
  Filled 2015-08-06 (×4): qty 250

## 2015-08-06 MED ORDER — SODIUM CHLORIDE 0.9% FLUSH
10.0000 mL | INTRAVENOUS | Status: DC | PRN
  Administered 2015-08-06: 20 mL
  Administered 2015-08-07 – 2015-08-09 (×3): 10 mL
  Filled 2015-08-06 (×3): qty 40

## 2015-08-06 MED ORDER — SODIUM BICARBONATE 8.4 % IV SOLN
50.0000 meq | Freq: Once | INTRAVENOUS | Status: AC
  Administered 2015-08-06: 50 meq via INTRAVENOUS
  Filled 2015-08-06: qty 50

## 2015-08-06 MED ORDER — FAT EMULSION 20 % IV EMUL
240.0000 mL | INTRAVENOUS | Status: AC
  Administered 2015-08-06: 240 mL via INTRAVENOUS
  Filled 2015-08-06: qty 250

## 2015-08-06 NOTE — Progress Notes (Signed)
Patient ID: [name], female   DOB: 26-Aug-1934, 81 y.o.   MRN: [id number]     Subjective:    Continued N/V this AM  Objective:   Temp:  [98.3 F (36.8 C)-98.7 F (37.1 C)] 98.5 F (36.9 C) (05/07 0546) Pulse Rate:  [75-83] 81 (05/07 0546) Resp:  [16-20] 16 (05/07 0546) BP: (152-216)/(60-80) 152/60 mmHg (05/07 0630) SpO2:  [96 %-100 %] 96 % (05/07 0546) Weight:  [114 lb 10.2 oz (52 kg)] 114 lb 10.2 oz (52 kg) (05/07 0500) Last BM Date: 08/04/15  Filed Weights   08/04/15 2307 08/05/15 0359 08/06/15 0500  Weight: 111 lb 15.9 oz (50.8 kg) 111 lb 15.9 oz (50.8 kg) 114 lb 10.2 oz (52 kg)    Intake/Output Summary (Last 24 hours) at 08/06/15 0740 Last data filed at 08/06/15 0600  Gross per 24 hour  Intake   1005 ml  Output      0 ml  Net   1005 ml    Telemetry: NSR  Exam:  General: NAD  HEENT: sclera clear, throat clear  Resp: CTAB  Cardiac: RRR, no m/r/g, no jvd  GI: abdomen soft, NT, ND  MSK: no LE edema  Neuro: no neuro deficits  Psych: appropriate affect  Lab Results:  Basic Metabolic Panel:  Recent Labs Lab 08/04/15 0443 08/05/15 0513 08/06/15 0109  NA 138 137 128*  K 3.8 3.7 4.1  CL 103 104 101  CO2 22 19* 16*  GLUCOSE 83 82 81  BUN 12 8 7   CREATININE 1.01* 0.85 0.84  CALCIUM 9.1 9.1 8.1*  MG 1.7 1.5* 1.9    Liver Function Tests:  Recent Labs Lab 08/03/15 1359 08/04/15 0443  AST 17 12*  ALT 8* 6*  ALKPHOS 64 53  BILITOT 1.1 0.8  PROT 6.8 6.0*  ALBUMIN 4.2 3.5    CBC:  Recent Labs Lab 08/04/15 0443 08/05/15 0513 08/06/15 0109  WBC 8.2 7.5 6.5  HGB 11.5* 12.1 10.1*  HCT 35.4* 37.0 31.5*  MCV 78.1 79.1 77.4*  PLT 219 195 187    Cardiac Enzymes: No results for input(s): CKTOTAL, CKMB, CKMBINDEX, TROPONINI in the last 168 hours.  BNP: No results for input(s): PROBNP in the last 8760 hours.  Coagulation:  Recent Labs Lab 08/04/15 0443  INR 1.19    ECG:   Medications:   Scheduled Medications: . cefTRIAXone  (ROCEPHIN)  IV  1 g Intravenous Q24H  . famotidine (PEPCID) IV  20 mg Intravenous Q12H  . sodium chloride flush  3 mL Intravenous Q12H     Infusions: . 0.9 % NaCl with KCl 20 mEq / L 75 mL/hr at 08/06/15 0204  . diltiazem (CARDIZEM) infusion 5 mg/hr (08/05/15 1706)  . heparin 850 Units/hr (08/06/15 0205)     PRN Medications:  ALPRAZolam, hydrALAZINE, nitroGLYCERIN, ondansetron (ZOFRAN) IV, prochlorperazine, zolpidem     Assessment/Plan    1. AFib with RVR - history of afib, likely rates exacerbated by admit with intractable N/V, likely decreased ability to keep her meds down - has not been on anticoag due to history of anemia, has not been on ASA either. Will not start at this time - started on dilt gtt, rates now 80s, and she has converted back to NSR - transient episode of slurred speech, CT head negative, has been started on hep gtt. She says her tongue felt funny and numb transiently making it difficult to speak, no other neuro symptoms.  - patient with continued N/V, continue dilt gtt  at this time  2. HTN - elevated, she has not been able to take her oral meds. Follow on dilt gtt, continue prn hydralazine. Can use prn labetaolol as well.    3. N/V - patient volvulus from hiatal hernia, followed by surgery    Carlyle Dolly, M.D.

## 2015-08-06 NOTE — Progress Notes (Addendum)
ANTICOAGULATION CONSULT NOTE - Follow Up Consult  Pharmacy Consult for Heparin Indication: atrial fibrillation  Allergies  Allergen Reactions  . Sulfamethoxazole Rash    Patient Measurements: Height: 5\' 2"  (157.5 cm) Weight: 114 lb 10.2 oz (52 kg) IBW/kg (Calculated) : 50.1 Heparin Dosing Weight: using actual body weight  Vital Signs: Temp: 98.5 F (36.9 C) (05/07 0546) Temp Source: Oral (05/07 0546) BP: 152/60 mmHg (05/07 0630) Pulse Rate: 81 (05/07 0546)  Labs:  Recent Labs  08/04/15 0443 08/05/15 0513 08/05/15 1754 08/06/15 0109 08/06/15 0945  HGB 11.5* 12.1  --  10.1*  --   HCT 35.4* 37.0  --  31.5*  --   PLT 219 195  --  187  --   APTT  --   --  40*  --   --   LABPROT 14.8  --   --   --   --   INR 1.19  --   --   --   --   HEPARINUNFRC  --   --   --  0.16* 0.42  CREATININE 1.01* 0.85  --  0.84  --     Estimated Creatinine Clearance: 43.7 mL/min (by C-G formula based on Cr of 0.84).   Assessment: 89 yoF with history of chronic diastolic HF, afib not on anticoag due to issues with anemia in the past, colostomy, history of colonic mass followed by gen surg, pulm HTN, TR, CAD with prior DES to LAD in 05/2011, and HTN. From clinic notes off ASA as well due to issues with anemia. She was admitted with intractable N/V. She has a significant hiatal hernia despite 2 previous open repairs, now with obstruction, that [facility]  Cardiology consulted for onset of afib with RVR. On 5/6, cardiology's recommendation was to continue to hold staring anticoagulation or aspirin due to history of anemia.    Significant events: 5/6 PM: patient reported slurred speech and tongue numbness that lasted ~5 minutes.  Per TRH, given no history of GIB and currently stable CBC (Hgb and Plts WNL), would like to start heparin infusion for afib with high risk of TIA/stroke.  CT head negative for bleed, no acute intracranial  abnormalities.  Discussed plan with MD, appears that symptoms have resolved and likely TIA so patient will not be candidate to eval for tPA so will proceed with beginning heparin infusion but will not bolus given concerns with starting an anticoagulant.  Baseline INR 1.19, aPTT 40 (collected 10 min after heparin started).  Today, 08/06/2015:  Heparin level therapeutic at 0.42 after rate increase.  Hgb decreased to 10.1, Platelets WNL.  Anemia felt to be due to intermittent bleeding from ascending colon tubulovillous adenoma.  Surgery and TRH discussed per notes and felt anticoagulation is necessary to avoid stroke and may need intermittent transfusions until surgery performed.  Occult stool and UGI ordered.  Goal of Therapy:  Heparin level 0.3-0.5 units/ml given anemia, risk for GIB Monitor platelets by anticoagulation protocol: Yes   Plan:  Continue heparin infusion at 850 units/hr. Check heparin level in 8 hours to confirm current rate.  Would prefer to keep heparin levels at lower end of therapeutic range given anemia, risk for GIB.  MD placed a note today recommending targeting lower end of therapeutic range as well. Daily CBC and heparin levels while on heparin infusion. F/u plan per MD for continued anticoagulation if patient bleeding.  Hershal Coria 08/06/2015,11:35 AM   Addendum: 6:42 PM  Heparin level this evening remains therapeutic at 0.45.  Plan: Continue heparin infusion at 850 units/hr. F/u labs in AM.  Hershal Coria, PharmD, BCPS Pager: [phone] 08/06/2015 6:42 PM

## 2015-08-06 NOTE — Progress Notes (Signed)
2 Days Post-Op Procedure(s) (LRB): ESOPHAGOGASTRODUODENOSCOPY (EGD) WITH PROPOFOL (N/A) Subjective: CT scan of chest and abdomen, recent echocardiogram, and op notes of previous hiatal hernia repair personally reviewed.  Very nice 81 year old female with recurrent sliding hiatal hernia and recent symptoms of upper GI obstruction from either a gastric volvulus or gastric outlet obstruction.  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 Scans  currently indicate a significant amount of stomach in the chest which was densely adherent to the pericardium at the second hiatal hernia  repair. She has been unable to keep any food down for the past 2 weeks. No chest or abdominal pain. Upper endoscopy was performed at this admission and the endoscope apparently passed through the pylorus.  Patient also has history of coronary disease status post PCI - stent to the proximal LAD and 2013. She has known pulmonary hypertension with moderate tricuspid regurgitation and history of atrial fib-flutter. Last night she developed atrial flutter and was placed on IV Cardizem. Her LV  systolic function by last echo is normal but there is moderate diastolic dysfunction.  The patient has a villous adenoma with bleeding in the right colon and maintaining adequate relation has been difficult.  Objective: Vital signs in last 24 hours: Temp:  [98.3 F (36.8 C)-98.7 F (37.1 C)] 98.5 F (36.9 C) (05/07 0546) Pulse Rate:  [75-83] 81 (05/07 0546) Cardiac Rhythm:  [-] Normal sinus rhythm (05/07 0705) Resp:  [16-20] 16 (05/07 0546) BP: (152-216)/(60-80) 152/60 mmHg (05/07 0630) SpO2:  [96 %-100 %] 96 % (05/07 0546) Weight:  [114 lb 10.2 oz (52 kg)] 114 lb 10.2 oz (52 kg) (05/07 0500)  Hemodynamic parameters for last 24 hours:     Intake/Output from previous day: 05/06 0701 - 05/07 0700 In: 1005 [P.O.:180; I.V.:825] Out: 0  Intake/Output this shift:        Physical Exam  General: Elderly thin female but very bright and with intact mental status HEENT: Normocephalic pupils equal , dentition adequate Neck: Supple without JVD, adenopathy, or bruit Chest: Clear to auscultation, symmetrical breath sounds, no rhonchi, no tenderness             or deformity. Bowel sounds present in the right side of the chest Cardiovascular: Regular rate and rhythm, 2/6 TR  murmur, no gallop, peripheral pulses             palpable in all extremities Abdomen:  Soft, nontender, no palpable mass or organomegaly    Well-healed midline surgical incision, colostomy left lower quadrant Extremities: Warm, well-perfused, no clubbing cyanosis edema or tenderness,              no venous stasis changes of the legs Rectal/GU: Deferred Neuro: Grossly non--focal and symmetrical throughout Skin: Clean and dry without rash or ulceration   Lab Results:  Recent Labs  08/05/15 0513 08/06/15 0109  WBC 7.5 6.5  HGB 12.1 10.1*  HCT 37.0 31.5*  PLT 195 187   BMET:  Recent Labs  08/05/15 0513 08/06/15 0109  NA 137 128*  K 3.7 4.1  CL 104 101  CO2 19* 16*  GLUCOSE 82 81  BUN 8 7  CREATININE 0.85 0.84  CALCIUM 9.1 8.1*    PT/INR:  Recent Labs  08/04/15 0443  LABPROT 14.8  INR 1.19   ABG    Component Value Date/Time  HCO3 24.9* 04/12/2011 1204   TCO2 26 04/12/2011 1204   ACIDBASEDEF 1.0 04/12/2011 1204   O2SAT 62.0 04/12/2011 1204   CBG (last 3)  No results for input(s): GLUCAP in the last 72 hours.  Assessment/Plan: S/P Procedure(s) (LRB): ESOPHAGOGASTRODUODENOSCOPY (EGD) WITH PROPOFOL (N/A)  Patient has a significant sliding hiatal hernia despite 2 previous open repairs. There are significant adhesions between the stomach and the pericardium in  the chest. It would appear that an additional attempt to reduce the  hernia would involve a simultaneous abdominal and right chest approach which would be at risk because of her cardiac status and advanced age. I  agree with the plan to place her on TPN and to refer this very nice patient to a [facility] for further surgical treatment of her hiatal hernia now with obstruction.  LOS: 3 days    [name] 08/06/2015

## 2015-08-06 NOTE — Progress Notes (Signed)
ANTICOAGULATION CONSULT NOTE - F/u Consult  Pharmacy Consult for Heparin Indication: atrial fibrillation  Allergies  Allergen Reactions  . Sulfamethoxazole Rash    Patient Measurements: Height: 5\' 2"  (157.5 cm) Weight: 111 lb 15.9 oz (50.8 kg) IBW/kg (Calculated) : 50.1 Heparin Dosing Weight: using actual body weight  Vital Signs: Temp: 98.7 F (37.1 C) (05/06 2123) Temp Source: Oral (05/06 2123) BP: 157/67 mmHg (05/06 2345) Pulse Rate: 83 (05/06 2345)  Labs:  Recent Labs  08/04/15 0443 08/05/15 0513 08/05/15 1754 08/06/15 0109  HGB 11.5* 12.1  --  10.1*  HCT 35.4* 37.0  --  31.5*  PLT 219 195  --  187  APTT  --   --  40*  --   LABPROT 14.8  --   --   --   INR 1.19  --   --   --   HEPARINUNFRC  --   --   --  0.16*  CREATININE 1.01* 0.85  --  0.84    Estimated Creatinine Clearance: 43.7 mL/min (by C-G formula based on Cr of 0.84).   Assessment: 45 yoF with history of chronic diastolic HF, afib not on anticoag due to issues with anemia in the past, colostomy, history of colonic mass followed by gen surg, pulm HTN, TR, CAD with prior DES to LAD in 05/2011, and HTN. From clinic notes off ASA as well due to issues with anemia. She was admitted with intractable N/V. Cardiology is consulted for onset of afib with RVR.   This morning, cardiology's recommendation was to continue to hold staring anticoagulation or aspirin due to history of anemia.    This evening, patient reported slurred speech and tongue numbness that lasted ~5 minutes.  Per TRH, given no history of GIB and currently stable CBC (Hgb and Plts WNL), would like to start heparin infusion for afib with high risk of TIA/stroke.  CT head negative for bleed, no acute intracranial abnormalities.  Discussed plan with MD, appears that symptoms have resolved and likely TIA so patient will not be candidate to eval for tPA so will proceed with beginning heparin infusion but will not bolus given concerns with starting an  anticoagulant.  Note baseline INR 1.19, baseline aPTT ordered STAT.  SCr improved since admission.  Today, 5/7  0109 HL=0.16, no infusion or bleeding problems per RN  Goal of Therapy:  Heparin level 0.3-0.7 units/ml Monitor platelets by anticoagulation protocol: Yes   Plan:  Increase heparin drip to 850 units/hr Check heparin level in 8 hours. Daily CBC and heparin levels while on heparin infusion.  Lawana Pai R 08/06/2015,2:04 AM

## 2015-08-06 NOTE — Progress Notes (Signed)
Stool via colostomy is dark brown. hemoccult sent, results negative.  Barbee Shropshire. Brigitte Pulse, RN

## 2015-08-06 NOTE — Progress Notes (Signed)
2 Days Post-Op  Subjective: No abdominal pain.  Tolerated clear liquids yesterday but awoke this morning with an episode of n/v (clear fluid).  Also, had some TIA type symptoms yesterday afternoon and was started on IV Heparin.  Has dark stool per colostomy but has been this way since she started on Iron.  Dr. Prescott Gum came by this AM. Objective: Vital signs in last 24 hours: Temp:  [98.3 F (36.8 C)-98.7 F (37.1 C)] 98.5 F (36.9 C) (05/07 0546) Pulse Rate:  [75-83] 81 (05/07 0546) Resp:  [16-20] 16 (05/07 0546) BP: (152-216)/(60-80) 152/60 mmHg (05/07 0630) SpO2:  [96 %-100 %] 96 % (05/07 0546) Weight:  [52 kg (114 lb 10.2 oz)] 52 kg (114 lb 10.2 oz) (05/07 0500) Last BM Date: 08/04/15  Intake/Output from previous day: 05/06 0701 - 05/07 0700 In: 1005 [P.O.:180; I.V.:825] Out: 0  Intake/Output this shift:    PE: General- In NAD Abdomen-soft, not tender, dark stool in bag.  Lab Results:   Recent Labs  08/05/15 0513 08/06/15 0109  WBC 7.5 6.5  HGB 12.1 10.1*  HCT 37.0 31.5*  PLT 195 187   BMET  Recent Labs  08/05/15 0513 08/06/15 0109  NA 137 128*  K 3.7 4.1  CL 104 101  CO2 19* 16*  GLUCOSE 82 81  BUN 8 7  CREATININE 0.85 0.84  CALCIUM 9.1 8.1*   PT/INR  Recent Labs  08/04/15 0443  LABPROT 14.8  INR 1.19   Comprehensive Metabolic Panel:    Component Value Date/Time   NA 128* 08/06/2015 0109   NA 137 08/05/2015 0513   K 4.1 08/06/2015 0109   K 3.7 08/05/2015 0513   CL 101 08/06/2015 0109   CL 104 08/05/2015 0513   CO2 16* 08/06/2015 0109   CO2 19* 08/05/2015 0513   BUN 7 08/06/2015 0109   BUN 8 08/05/2015 0513   CREATININE 0.84 08/06/2015 0109   CREATININE 0.85 08/05/2015 0513   GLUCOSE 81 08/06/2015 0109   GLUCOSE 82 08/05/2015 0513   CALCIUM 8.1* 08/06/2015 0109   CALCIUM 9.1 08/05/2015 0513   AST 12* 08/04/2015 0443   AST 17 08/03/2015 1359   ALT 6* 08/04/2015 0443   ALT 8* 08/03/2015 1359   ALKPHOS 53 08/04/2015 0443   ALKPHOS  64 08/03/2015 1359   BILITOT 0.8 08/04/2015 0443   BILITOT 1.1 08/03/2015 1359   PROT 6.0* 08/04/2015 0443   PROT 6.8 08/03/2015 1359   ALBUMIN 3.5 08/04/2015 0443   ALBUMIN 4.2 08/03/2015 1359     Studies/Results: Ct Head Wo Contrast  08/05/2015  CLINICAL DATA:  Slurred speech and tongue numbness lasting 5 minutes, almost resolved, history atrial fibrillation, high risk of TIA and stroke, was off anti coagulation due to history of GI bleed, history CHF, pulmonary hypertension, hypertension, former smoker EXAM: CT HEAD WITHOUT CONTRAST TECHNIQUE: Contiguous axial images were obtained from the base of the skull through the vertex without intravenous contrast. COMPARISON:  None FINDINGS: Generalized atrophy. Normal ventricular morphology. No midline shift or mass effect. Mild small vessel chronic ischemic changes of deep cerebral white matter. No intracranial hemorrhage, mass lesion, or evidence of acute infarction. No extra-axial fluid collections. Atherosclerotic calcifications at carotid siphons and vertebral arteries. Bones and sinuses unremarkable. IMPRESSION: Atrophy with small vessel chronic ischemic changes of deep cerebral white matter. No acute intracranial abnormalities. Electronically Signed   By: Lavonia Dana M.D.   On: 08/05/2015 16:53    Anti-infectives: Anti-infectives    Start  Dose/Rate Route Frequency Ordered Stop   08/05/15 0900  cefTRIAXone (ROCEPHIN) 1 g in dextrose 5 % 50 mL IVPB     1 g 100 mL/hr over 30 Minutes Intravenous Every 24 hours 08/05/15 0804        Assessment Active Problems:   N&V (nausea and vomiting) due to gastric volvulus from recurrent hiatal hernia:  Dr. Silverio Decamp felt she reduced this during EGD; there was food and liquid in stomach; her recurrent hiatal hernia was not reducible/repairable via a abdominal approach 11 months ago-episode of n/v this AM; per daughter, Dr. Prescott Gum felt this would need to be repaired via a thoracoabdominal approach at a  medical center.   Anemia felt to be due to intermittent bleeding from ascending colon tubulovillous adenoma (no high-grade dysplasia or malignancy)-hemoglobin dropped 2 grams since Heparin drip started; discussed this with Dr. Erlinda Hong  Symptomatic cholelithiasis-has been asx recently  Poorly controlled HTN  Afib with TIA type symptoms yesterday  PC malnutrition-not as bad as I expected with Prealbumin 15.6    LOS: 3 days   Plan:  PICC and TPN.  Anticoagulation runs the risk of occult GI bleeding an anemia, but if felt to be necessary to avoid stroke, she might need intermittent transfusions until surgery performed-will hemoccult stools; [facility], intermittently bleeding ascending colon polyp, cholecystectomy.  Check UGI tomorrow.  Keisuke Hollabaugh J 08/06/2015

## 2015-08-06 NOTE — Progress Notes (Signed)
Peripherally Inserted Central Catheter/Midline Placement  The IV Nurse has discussed with the patient and/or persons authorized to consent for the patient, the purpose of this procedure and the potential benefits and risks involved with this procedure.  The benefits include less needle sticks, lab draws from the catheter and patient may be discharged home with the catheter.  Risks include, but not limited to, infection, bleeding, blood clot (thrombus formation), and puncture of an artery; nerve damage and irregular heat beat.  Alternatives to this procedure were also discussed.  PICC/Midline Placement Documentation  PICC Double Lumen 08/06/15 PICC Right Brachial 34 cm 0 cm (Active)  Indication for Insertion or Continuance of Line Vasoactive infusions;Administration of hyperosmolar/irritating solutions (i.e. TPN, Vancomycin, etc.);Prolonged intravenous therapies 08/06/2015  5:50 PM  Exposed Catheter (cm) 0 cm 08/06/2015  5:50 PM  Site Assessment Clean;Dry;Intact 08/06/2015  5:50 PM  Lumen #1 Status Flushed;Saline locked;Blood return noted 08/06/2015  5:50 PM  Lumen #2 Status Flushed;Saline locked;Blood return noted 08/06/2015  5:50 PM  Dressing Type Transparent 08/06/2015  5:50 PM  Dressing Status Clean;Dry;Intact;Antimicrobial disc in place 08/06/2015  5:50 PM  Line Care Connections checked and tightened 08/06/2015  5:50 PM  Line Adjustment (NICU/IV Team Only) No 08/06/2015  5:50 PM  Dressing Intervention New dressing 08/06/2015  5:50 PM  Dressing Change Due 08/13/15 08/06/2015  5:50 PM       Rolena Infante 08/06/2015, 5:51 PM

## 2015-08-06 NOTE — Progress Notes (Signed)
PROGRESS NOTE  [name] [id number] DOB: 1934/09/20 DOA: 08/03/2015 PCP: Terald Sleeper, PA-C  HPI/Recap of past 24 hours:  Converted to sinus rhythm on cardizem drip, remain on cardizem drip due to not able to take oral meds   remain intermittent dry heaving, denies ab pain, daughters  in room.  Assessment/Plan: Active Problems:   Vomiting   N&V (nausea and vomiting)   Uncontrollable vomiting   Gastric outlet obstruction  Intractable N/V/large hiatal hernia/gastic outlet obstruction/gastric volvulus:  with normal colostomy function, lft wnl, no ab pain, ct ab concerning for gastric outlet obstruction, gastric volvulus, npo/ ivf/prn antiemetics EGD on 5/5 confirmed erosive esophagitis and gastric volvulus. General surgery consulted. Keep npo per gi notes. Not able to tolerate clears, continue ivf. General surgery ordered picc line and tpn.  Cardiothoracic surgery consulted    Hypokalemia/hypomagnesemia: replace k,mag   Afib/RVR with h/o PAF, not on anticoagulation in the past due to h/o GI bleed anemia. initially in NSR, then in afib/RVR on cardizem drip, converted to NSR, remain on cardizem drip due to not able to tolerate oral intake,  Patient developed sign of TIA with tongue numbness and slurry speech on 5/6 , heparin drip started, patient and family aware the risk of bleed, they choose to continue anticoagulation and transfuse prn unless life threatening bleed, patient states " i do not want risk having stroke"  cardiology consulted, input appreciated.  HTN: uncontrolled due to not able to keep oral meds down, on cardizem drip and prn hydralazine for now  H/o hiatal hernia, acid reflux, on iv pepcid  H/o colonic mass, general surgery following  UTI? Culture pending, empirically start rocephin    DVT prophylaxis: now on heparin drip  Consultants: general surgery, GI, cardiothoracic surgery, cardiology  Code Status: full   Family Communication: Patient and  daughter in room  Disposition Plan: pending  Procedures:  EGD on 5/5  picc line /tpn  Antibiotics:  Rocephin from 5/6   Objective: BP 183/73 mmHg  Pulse 81  Temp(Src) 98.5 F (36.9 C) (Oral)  Resp 16  Ht 5\' 2"  (1.575 m)  Wt 52 kg (114 lb 10.2 oz)  BMI 20.96 kg/m2  SpO2 99%  Intake/Output Summary (Last 24 hours) at 08/06/15 1325 Last data filed at 08/06/15 0600  Gross per 24 hour  Intake    945 ml  Output      0 ml  Net    945 ml   Filed Weights   08/04/15 2307 08/05/15 0359 08/06/15 0500  Weight: 50.8 kg (111 lb 15.9 oz) 50.8 kg (111 lb 15.9 oz) 52 kg (114 lb 10.2 oz)    Exam:   General:  NAD  Cardiovascular: RRR  Respiratory: CTABL  Abdomen: Soft/ND/NT, positive BS, + colostomy  Musculoskeletal: No Edema  Neuro: aaox3  Data Reviewed: Basic Metabolic Panel:  Recent Labs Lab 08/03/15 1359 08/04/15 0443 08/05/15 0513 08/06/15 0109  NA 137 138 137 128*  K 2.9* 3.8 3.7 4.1  CL 97* 103 104 101  CO2 24 22 19* 16*  GLUCOSE 113* 83 82 81  BUN 14 12 8 7   CREATININE 1.28* 1.01* 0.85 0.84  CALCIUM 9.6 9.1 9.1 8.1*  MG  --  1.7 1.5* 1.9  PHOS  --   --   --  2.2*   Liver Function Tests:  Recent Labs Lab 08/03/15 1359 08/04/15 0443  AST 17 12*  ALT 8* 6*  ALKPHOS 64 53  BILITOT 1.1 0.8  PROT 6.8  6.0*  ALBUMIN 4.2 3.5    Recent Labs Lab 08/03/15 1359  LIPASE 18   No results for input(s): AMMONIA in the last 168 hours. CBC:  Recent Labs Lab 08/03/15 1358 08/04/15 0443 08/05/15 0513 08/06/15 0109  WBC 10.1 8.2 7.5 6.5  NEUTROABS 6.6  --   --   --   HGB 13.1 11.5* 12.1 10.1*  HCT 40.9 35.4* 37.0 31.5*  MCV 79.7 78.1 79.1 77.4*  PLT 266 219 195 187   Cardiac Enzymes:   No results for input(s): CKTOTAL, CKMB, CKMBINDEX, TROPONINI in the last 168 hours. BNP (last 3 results)  Recent Labs  06/16/15 2233 06/19/15 0555  BNP 1250.0* 217.0*    ProBNP (last 3 results) No results for input(s): PROBNP in the last 8760  hours.  CBG: No results for input(s): GLUCAP in the last 168 hours.  Recent Results (from the past 240 hour(s))  Urine culture     Status: Abnormal   Collection Time: 08/03/15  2:03 PM  Result Value Ref Range Status   Specimen Description URINE, CLEAN CATCH  Final   Special Requests NONE  Final   Culture MULTIPLE SPECIES PRESENT, SUGGEST RECOLLECTION (A)  Final   Report Status 08/05/2015 FINAL  Final     Studies: Ct Head Wo Contrast  08/05/2015  CLINICAL DATA:  Slurred speech and tongue numbness lasting 5 minutes, almost resolved, history atrial fibrillation, high risk of TIA and stroke, was off anti coagulation due to history of GI bleed, history CHF, pulmonary hypertension, hypertension, former smoker EXAM: CT HEAD WITHOUT CONTRAST TECHNIQUE: Contiguous axial images were obtained from the base of the skull through the vertex without intravenous contrast. COMPARISON:  None FINDINGS: Generalized atrophy. Normal ventricular morphology. No midline shift or mass effect. Mild small vessel chronic ischemic changes of deep cerebral white matter. No intracranial hemorrhage, mass lesion, or evidence of acute infarction. No extra-axial fluid collections. Atherosclerotic calcifications at carotid siphons and vertebral arteries. Bones and sinuses unremarkable. IMPRESSION: Atrophy with small vessel chronic ischemic changes of deep cerebral white matter. No acute intracranial abnormalities. Electronically Signed   By: Lavonia Dana M.D.   On: 08/05/2015 16:53    Scheduled Meds: . cefTRIAXone (ROCEPHIN)  IV  1 g Intravenous Q24H  . famotidine (PEPCID) IV  20 mg Intravenous Q12H  . sodium bicarbonate  50 mEq Intravenous Once  . sodium chloride flush  3 mL Intravenous Q12H    Continuous Infusions: . 0.9 % NaCl with KCl 20 mEq / L 75 mL/hr at 08/06/15 0204  . diltiazem (CARDIZEM) infusion 5 mg/hr (08/06/15 1323)  . heparin 850 Units/hr (08/06/15 0205)     Time spent: 25mins  Palmira Stickle MD, PhD  [facility] Pager [phone]. If 7PM-7AM, please contact night-coverage at [email or web address], password [facility] 08/06/2015, 1:25 PM  LOS: 3 days

## 2015-08-06 NOTE — Progress Notes (Signed)
PARENTERAL NUTRITION CONSULT NOTE - INITIAL  Pharmacy Consult for TPN Indication: Intolerance to enteral feeding  Allergies  Allergen Reactions  . Sulfamethoxazole Rash    Patient Measurements: Height: 5\' 2"  (157.5 cm) Weight: 114 lb 10.2 oz (52 kg) IBW/kg (Calculated) : 50.1  Vital Signs: Temp: 98.5 F (36.9 C) (05/07 1200) Temp Source: Oral (05/07 1200) BP: 183/73 mmHg (05/07 1200) Pulse Rate: 81 (05/07 0546) Intake/Output from previous day: 05/06 0701 - 05/07 0700 In: 1005 [P.O.:180; I.V.:825] Out: 0  Intake/Output from this shift:    Labs:  Recent Labs  08/04/15 0443 08/05/15 0513 08/05/15 1754 08/06/15 0109  WBC 8.2 7.5  --  6.5  HGB 11.5* 12.1  --  10.1*  HCT 35.4* 37.0  --  31.5*  PLT 219 195  --  187  APTT  --   --  40*  --   INR 1.19  --   --   --      Recent Labs  08/03/15 1359 08/03/15 1628 08/04/15 0443 08/05/15 0513 08/06/15 0109  NA 137  --  138 137 128*  K 2.9*  --  3.8 3.7 4.1  CL 97*  --  103 104 101  CO2 24  --  22 19* 16*  GLUCOSE 113*  --  83 82 81  BUN 14  --  12 8 7   CREATININE 1.28*  --  1.01* 0.85 0.84  CALCIUM 9.6  --  9.1 9.1 8.1*  MG  --   --  1.7 1.5* 1.9  PHOS  --   --   --   --  2.2*  PROT 6.8  --  6.0*  --   --   ALBUMIN 4.2  --  3.5  --   --   AST 17  --  12*  --   --   ALT 8*  --  6*  --   --   ALKPHOS 64  --  53  --   --   BILITOT 1.1  --  0.8  --   --   PREALBUMIN  --  15.6*  --   --   --    Estimated Creatinine Clearance: 43.7 mL/min (by C-G formula based on Cr of 0.84).   No results for input(s): GLUCAP in the last 72 hours.  Medical History: Past Medical History  Diagnosis Date  . Depressive disorder, not elsewhere classified   . Anxiety state, unspecified   . Pure hypercholesterolemia   . Esophageal reflux   . Infection of esophagostomy ([place])   . Congestive heart failure, unspecified     diastolic heart failure  . Pulmonary hypertension (HCC)     PA systolic pressure [id number] mmHg by  echocardiogram, PA pressure 33/10 by cardiac catheterization PA saturation 62% thermodilution cardiac index 2.0 thick cardiac index 2.4  . Tricuspid regurgitation     moderate tricuspid regurgitation by echocardiogram, prior use of anorexic agents  . Melanoma ([place])     removal on back   . Coronary artery disease     a. Moderate to severe coronary disease involving the left anterior descending artery and right coronary artery.  Sequential stenosis in the LAD is significant.  Right coronary artery is moderate to severely likely nonischemic. Questionable small LVOT obstruction.  No Brockenbrough maneuver was performed.  Catheterization January 2013; b. 05/2014 MV no isch/infarct, EF 60%.  . Hypertension   . H/O hiatal hernia   . Gastritis   . [name]'s esophagus   .  IBS (irritable bowel syndrome)   . Macular degeneration   . Diverticulitis   . Perforated bowel ([place])   . Anemia   . Atrial fibrillation with RVR ([place])   . Perforated sigmoid colon ([place])   . Cellulitis of extremity   . Pelvic abscess in female   . PONV (postoperative nausea and vomiting)     Insulin Requirements: none, no history of diabetes  Current Nutrition: clear liquids  IVF:  NS w/ KCl 4meq/L @ 64mls/hr  Central access: ordered for 5/7 TPN start date: 5/7 ?  ASSESSMENT                                                                                                          HPI: 56 yoF with history of chronic diastolic HF, afib not on anticoag due to issues with anemia in the past, colostomy, history of colonic mass followed by gen surg, pulm HTN, TR, CAD with prior DES to LAD in 05/2011, and HTN. From clinic notes off ASA as well due to issues with anemia. She was admitted with intractable N/V.  Significant events:   Today:    Glucose - <150  Electrolytes - Na 128, CCa 8.5, phos 2.2  Renal - WNL  LFTs - low  TGs - check with am labs  Prealbumin - 15.6 (5/4)  NUTRITIONAL GOALS                                                                                              RD recs: 50-60 g/day protein, Kcal 1250-1500/day Clinimix E 5/15 at a goal rate of 41ml/hr + 20% fat emulsion at 19ml/hr to provide: 60g/day protein, 1332Kcal/day.  PLAN                                                                                                     Na Phos 67mmol x 1                    At 1800 today:  Start Clinimix E 5/15 @ 14mls/hr  20% fat emulsion at 39ml/hr.  Plan to advance as tolerated to the goal rate, then convert to cyclic  TPN to contain standard multivitamins and trace elements.  Reduce IVF to 86ml/hr.  Add sensitive  SSI .   TPN lab panels on Mondays & Thursdays.  F/u daily.  Dolly Rias RPh 08/06/2015, 3:54 PM Pager [phone]

## 2015-08-07 ENCOUNTER — Inpatient Hospital Stay (HOSPITAL_COMMUNITY): Payer: Medicare Other

## 2015-08-07 DIAGNOSIS — E43 Unspecified severe protein-calorie malnutrition: Secondary | ICD-10-CM

## 2015-08-07 DIAGNOSIS — G459 Transient cerebral ischemic attack, unspecified: Secondary | ICD-10-CM

## 2015-08-07 LAB — DIFFERENTIAL
Basophils Absolute: 0 10*3/uL (ref 0.0–0.1)
Basophils Relative: 0 %
EOS PCT: 4 %
Eosinophils Absolute: 0.2 10*3/uL (ref 0.0–0.7)
LYMPHS PCT: 25 %
Lymphs Abs: 1.4 10*3/uL (ref 0.7–4.0)
MONO ABS: 0.4 10*3/uL (ref 0.1–1.0)
MONOS PCT: 7 %
NEUTROS ABS: 3.7 10*3/uL (ref 1.7–7.7)
Neutrophils Relative %: 64 %

## 2015-08-07 LAB — COMPREHENSIVE METABOLIC PANEL
ALBUMIN: 3.1 g/dL — AB (ref 3.5–5.0)
ALK PHOS: 40 U/L (ref 38–126)
ALT: 7 U/L — ABNORMAL LOW (ref 14–54)
ANION GAP: 7 (ref 5–15)
AST: 11 U/L — ABNORMAL LOW (ref 15–41)
BUN: 9 mg/dL (ref 6–20)
CALCIUM: 8.6 mg/dL — AB (ref 8.9–10.3)
CO2: 25 mmol/L (ref 22–32)
Chloride: 103 mmol/L (ref 101–111)
Creatinine, Ser: 0.78 mg/dL (ref 0.44–1.00)
GFR calc Af Amer: >60 mL/min (ref >60)
GLUCOSE: 114 mg/dL — AB (ref 65–99)
POTASSIUM: 3.8 mmol/L (ref 3.5–5.1)
Sodium: 135 mmol/L (ref 135–145)
TOTAL PROTEIN: 5.2 g/dL — AB (ref 6.5–8.1)
Total Bilirubin: 0.4 mg/dL (ref 0.3–1.2)

## 2015-08-07 LAB — GLUCOSE, CAPILLARY
GLUCOSE-CAPILLARY: 118 mg/dL — AB (ref 65–99)
GLUCOSE-CAPILLARY: 129 mg/dL — AB (ref 65–99)
Glucose-Capillary: 110 mg/dL — ABNORMAL HIGH (ref 65–99)
Glucose-Capillary: 129 mg/dL — ABNORMAL HIGH (ref 65–99)
Glucose-Capillary: 148 mg/dL — ABNORMAL HIGH (ref 65–99)

## 2015-08-07 LAB — PREALBUMIN: PREALBUMIN: 10.7 mg/dL — AB (ref 18–38)

## 2015-08-07 LAB — CBC
HEMATOCRIT: 31 % — AB (ref 36.0–46.0)
HEMOGLOBIN: 10.3 g/dL — AB (ref 12.0–15.0)
MCH: 26.1 pg (ref 26.0–34.0)
MCHC: 33.2 g/dL (ref 30.0–36.0)
MCV: 78.7 fL (ref 78.0–100.0)
Platelets: 190 10*3/uL (ref 150–400)
RBC: 3.94 MIL/uL (ref 3.87–5.11)
RDW: 16.7 % — AB (ref 11.5–15.5)
WBC: 5.7 10*3/uL (ref 4.0–10.5)

## 2015-08-07 LAB — MAGNESIUM: Magnesium: 1.8 mg/dL (ref 1.7–2.4)

## 2015-08-07 LAB — HEPARIN LEVEL (UNFRACTIONATED)
Heparin Unfractionated: 0.3 IU/mL (ref 0.30–0.70)
Heparin Unfractionated: 0.49 IU/mL (ref 0.30–0.70)

## 2015-08-07 LAB — TRIGLYCERIDES: Triglycerides: 92 mg/dL (ref <150)

## 2015-08-07 LAB — OCCULT BLOOD X 1 CARD TO LAB, STOOL: FECAL OCCULT BLD: NEGATIVE

## 2015-08-07 LAB — PHOSPHORUS: PHOSPHORUS: 3.6 mg/dL (ref 2.5–4.6)

## 2015-08-07 MED ORDER — MAGNESIUM SULFATE IN D5W 1-5 GM/100ML-% IV SOLN
1.0000 g | Freq: Once | INTRAVENOUS | Status: AC
  Administered 2015-08-07: 1 g via INTRAVENOUS
  Filled 2015-08-07: qty 100

## 2015-08-07 MED ORDER — FAT EMULSION 20 % IV EMUL
240.0000 mL | INTRAVENOUS | Status: AC
  Administered 2015-08-07: 240 mL via INTRAVENOUS
  Filled 2015-08-07: qty 250

## 2015-08-07 MED ORDER — ISOSORBIDE MONONITRATE ER 30 MG PO TB24
30.0000 mg | ORAL_TABLET | Freq: Every day | ORAL | Status: DC
  Administered 2015-08-07 – 2015-08-09 (×3): 30 mg via ORAL
  Filled 2015-08-07 (×3): qty 1

## 2015-08-07 MED ORDER — POTASSIUM CHLORIDE IN NACL 20-0.9 MEQ/L-% IV SOLN
INTRAVENOUS | Status: DC
  Filled 2015-08-07: qty 1000

## 2015-08-07 MED ORDER — TRACE MINERALS CR-CU-MN-SE-ZN 10-1000-500-60 MCG/ML IV SOLN
INTRAVENOUS | Status: AC
  Administered 2015-08-07: 18:00:00 via INTRAVENOUS
  Filled 2015-08-07: qty 1440

## 2015-08-07 MED ORDER — POTASSIUM CHLORIDE 10 MEQ/100ML IV SOLN
10.0000 meq | INTRAVENOUS | Status: AC
  Administered 2015-08-07 (×2): 10 meq via INTRAVENOUS
  Filled 2015-08-07 (×2): qty 100

## 2015-08-07 NOTE — Care Management Important Message (Signed)
Important Message  Patient Details  Name: [name] MRN: [id number] Date of Birth: [birth year]   Medicare Important Message Given:  Yes    Camillo Flaming 08/07/2015, 9:26 AMImportant Message  Patient Details  Name: [name] MRN: [id number] Date of Birth: January 06, 1935   Medicare Important Message Given:  Yes    Camillo Flaming 08/07/2015, 9:26 AM

## 2015-08-07 NOTE — Progress Notes (Signed)
Nutrition Follow-up  DOCUMENTATION CODES:   Severe malnutrition in context of chronic illness  INTERVENTION:  - Continue TPN per pharmacy - RD will continue to monitor for needs prior to possible transfer for surgery  NUTRITION DIAGNOSIS:   Malnutrition related to chronic illness as evidenced by severe depletion of muscle mass, severe depletion of body fat. -ongoing  GOAL:   Patient will meet greater than or equal to 90% of their needs -unmet with current TPN rate and NPO status.  MONITOR:   Diet advancement, Weight trends, Labs, Skin, I & O's, Other (Comment) (TPN regimen)  REASON FOR ASSESSMENT:   New TPN/TNA  ASSESSMENT:   [name] is a 81 yo female H/o PAF , not on anticoagulation due to gi bleed/anemia, actively followed by cardiology, h/o larger hiatal hernia, h/o perforated diverticulitis s/p resection and colostomy, h/o right colon mass under evaluation for surgical resection who developed intractable n/v for the last three weeks, she was seen by gi and general surgery but her symptoms became worse, she presented to [facility] long ED  5/8 Pt reports that she is feeling hungry today; noted pt had EGD 5/5 which showed gastric volvulus and erosive esophagitis. Pt states that she has been experiencing throat pain but feels it is related to probe during EGD. She denies abdominal pain or N/V since previous RD visit. She has been consuming ice chips today without issue. Per notes, pt was to have UGI this AM; will monitor for results.  Per surgery notes, [facility]. Nutrition needs slightly adjusted this assessment given medical course and presentation. Pt had PICC placed and TPN initiated yesterday at 1800. She is currently receiving Clinimix E 5/15 @ 40 mL/hr with 20% lipids @ 10 mL/hr. Per pharmacy note, plan to advance to goal TPN tonight: Clinimix E 5/15 @ 60 mL/hr with 20% lipids @ 10 mL/hr which will provide 60  grams protein (86% minimum re-estimated protein needs) and 1332 kcal (100% re-estimated kcal needs). Spoke with pharmacist about this change and plan to possible increase TPN rate on 5/9; RD will follow-up with pt on 5/9.   Medications reviewed; 15 mmol KPhos x1 dose 5/7. IVF: NS-20 mEq KCl @ 15 mL/hr. Labs reviewed; CBGs: 110-129 mg/dL today, Ca: 8.6 mg/dL, AST/ALT low.    5/5 - Pt has been experiencing intractable nausea/vomiting for the 3 weeks PTA. -  She states that she has not felt good since December.  - During this time span she has seen her weight drop from 153# - 110, a 43#/28% severe wt loss for that time span. - She underwent an EGD today; she states that she feels much better in general and was in the process of ordering food during RD visit.  - She stated that this is the first time she has had an appetite since December. - Nutrition-Focused physical exam completed. Findings are severe fat depletion, severe muscle depletion, and no edema.    Diet Order:  TPN (CLINIMIX-E) Adult Diet NPO time specified TPN (CLINIMIX-E) Adult  Skin:  Wound (see comment) (MSAD)  Last BM:  5/3  Height:   Ht Readings from Last 1 Encounters:  08/03/15 5\' 2"  (1.575 m)    Weight:   Wt Readings from Last 1 Encounters:  08/07/15 113 lb 15.7 oz (51.7 kg)    Ideal Body Weight:  50 kg  BMI:  Body mass index is 20.84 kg/(m^2).  Estimated Nutritional Needs:   Kcal:  1300-1550 (25-30 kcal/kg)  Protein:  70-80 grams (1.35-1.55 grams/kg)  Fluid:  >/= 1.5 L/day  EDUCATION NEEDS:   No education needs identified at this time     Jarome Matin, RD, LDN Inpatient Clinical Dietitian Pager # [phone] After hours/weekend pager # [phone]

## 2015-08-07 NOTE — Progress Notes (Signed)
Subjective: She reports feeling better and slept well.  No SOB, CP.  Objective: Vital signs in last 24 hours: Temp:  [97.7 F (36.5 C)-98.5 F (36.9 C)] 97.7 F (36.5 C) (05/08 0553) Pulse Rate:  [66-77] 66 (05/08 0553) Resp:  [16] 16 (05/08 0553) BP: (168-190)/(66-76) 168/68 mmHg (05/08 0659) SpO2:  [98 %-99 %] 98 % (05/08 0553) Weight:  [113 lb 15.7 oz (51.7 kg)] 113 lb 15.7 oz (51.7 kg) (05/08 0553) Last BM Date: 08/04/15  Intake/Output from previous day: 05/07 0701 - 05/08 0700 In: 1703.9 [I.V.:1102.3; IV Piggyback:555; TPN:46.7] Out: -  Intake/Output this shift:    Medications Scheduled Meds: . cefTRIAXone (ROCEPHIN)  IV  1 g Intravenous Q24H  . famotidine (PEPCID) IV  20 mg Intravenous Q12H  . insulin aspart  0-9 Units Subcutaneous Q6H  . sodium chloride flush  3 mL Intravenous Q12H   Continuous Infusions: . 0.9 % NaCl with KCl 20 mEq / L 35 mL/hr at 08/06/15 1824  . diltiazem (CARDIZEM) infusion 5 mg/hr (08/06/15 1323)  . Marland KitchenTPN (CLINIMIX-E) Adult 40 mL/hr at 08/06/15 1801   And  . fat emulsion 240 mL (08/06/15 1801)  . heparin 850 Units/hr (08/06/15 2342)   PRN Meds:.ALPRAZolam, hydrALAZINE, nitroGLYCERIN, ondansetron (ZOFRAN) IV, prochlorperazine, sodium chloride flush, zolpidem  PE: General appearance: alert, cooperative and no distress Lungs: Decerased BS at the right base Heart: regular rate and rhythm, S1, S2 normal, no murmur, click, rub or gallop Abdomen: +BS Extremities: No LEE Pulses: 2+ and symmetric Skin: Warm and dry Neurologic: Grossly normal  Lab Results:   Recent Labs  08/05/15 0513 08/06/15 0109 08/07/15 0354  WBC 7.5 6.5 5.7  HGB 12.1 10.1* 10.3*  HCT 37.0 31.5* 31.0*  PLT 195 187 190   BMET  Recent Labs  08/05/15 0513 08/06/15 0109 08/07/15 0354  NA 137 128* 135  K 3.7 4.1 3.8  CL 104 101 103  CO2 19* 16* 25  GLUCOSE 82 81 114*  BUN 8 7 9   CREATININE 0.85 0.84 0.78  CALCIUM 9.1 8.1* 8.6*       Assessment/Plan 80 y.o.female with history of chronic diastolic HF, afib not on anticoag due to issues with anemia in the past, colostomy, history of colonic mass followed by gen surg, pulm HTN, TR, CAD with prior DES to LAD in 05/2011, and HTN. From clinic notes off ASA as well due to issues with anemia. She was admitted with intractable N/V.      N&V (nausea and vomiting)   Gastric outlet obstruction   Irreducible hiatal hernia   TIA (transient ischemic attack)    A-fib RVR Now in NSR on IV cardizem at 5mg /hr.  She does have periodic Afib on tele.  Continue since she can not take PO yet.  Not anticoagulated long term due to history of GIB and anemia.   She is tolerating heparin however.     HTN  Continue IV cardizem but increase to 10mg /hr for better BP control.    [facility]     LOS: 4 days    HAGER, BRYAN PA-C 08/07/2015 8:03 AM  History and all data above reviewed.  Patient examined.  I agree with the findings as above.  No pain.  No SOB. The patient exam reveals COR:RRR  ,  Lungs: Clear  ,  Abd: Positive bowel sounds, no rebound no guarding, Ext No edema  .  All available labs, radiology testing, previous records reviewed. Agree with  documented assessment and plan.   Atrial fib:  NSR.  Continue current therapy.   Jeneen Rinks [facility]  10:58 AM  08/07/2015

## 2015-08-07 NOTE — Progress Notes (Signed)
Pt pressure was 197/96.   Hydralazine given at 1556-pressure currently 178/71.  MD made aware.  Will continue to monitor closely.

## 2015-08-07 NOTE — Progress Notes (Signed)
ANTICOAGULATION CONSULT NOTE - Follow Up Consult  Pharmacy Consult for Heparin Indication: atrial fibrillation  Allergies  Allergen Reactions  . Sulfamethoxazole Rash    Patient Measurements: Height: 5\' 2"  (157.5 cm) Weight: 113 lb 15.7 oz (51.7 kg) IBW/kg (Calculated) : 50.1 Heparin Dosing Weight: using actual body weight  Vital Signs: Temp: 97.7 F (36.5 C) (05/08 0553) Temp Source: Oral (05/08 0553) BP: 168/68 mmHg (05/08 0659) Pulse Rate: 66 (05/08 0553)  Labs:  Recent Labs  08/05/15 0513 08/05/15 1754  08/06/15 0109 08/06/15 0945 08/06/15 1800 08/07/15 0354  HGB 12.1  --   --  10.1*  --   --  10.3*  HCT 37.0  --   --  31.5*  --   --  31.0*  PLT 195  --   --  187  --   --  190  APTT  --  40*  --   --   --   --   --   HEPARINUNFRC  --   --   < > 0.16* 0.42 0.45 0.30  CREATININE 0.85  --   --  0.84  --   --  0.78  < > = values in this interval not displayed.  Estimated Creatinine Clearance: 45.8 mL/min (by C-G formula based on Cr of 0.78).   Assessment: 45 yoF with history of chronic diastolic HF, afib not on anticoag or aspirin due to issues with anemia in the past, colostomy, history of colonic mass followed by gen surg, pulm HTN, TR, CAD with prior DES to LAD in 05/2011, and HTN.  She was admitted on 5/4 with intractable N/V. She has a significant hiatal hernia despite 2 previous open repairs, now with obstruction, that [facility]  Cardiology consulted for onset of afib with RVR. On 5/6, cardiology's recommendation was to continue to hold staring anticoagulation or aspirin due to history of anemia.    Significant events: 5/6 PM: patient reported slurred speech and tongue numbness that lasted ~5 minutes.  Per TRH, given no history of GIB and currently stable CBC (Hgb and Plts WNL), would like to start heparin infusion for afib with high risk of TIA/stroke.  CT head negative for bleed, no acute  intracranial abnormalities.  Discussed plan with MD, appears that symptoms have resolved and likely TIA so patient will not be candidate to eval for tPA.  Proceed with beginning heparin infusion but will not bolus given concerns with starting an anticoagulant.  Baseline INR 1.19, aPTT 40 (collected 10 min after heparin started).  Today, 08/07/2015:  Heparin level 0.3, therapeutic at low end of range.  CBC: Hgb 10.3, Remains low but fairly stable.  Platelets WNL.  Anemia felt to be due to intermittent bleeding from ascending colon tubulovillous adenoma.  Surgery and TRH discussed per notes and felt anticoagulation is necessary to avoid stroke and may need intermittent transfusions until surgery performed.    Occult stool negative and UGI ordered.  RN reports no bleeding or IV line complications.  Goal of Therapy:  Heparin level 0.3-0.5 units/ml given anemia, risk for GIB Monitor platelets by anticoagulation protocol: Yes   Plan:   Continue heparin infusion at 850 units/hr.  Check heparin level in 8 hours to confirm current rate.  (Aim for heparin levels at lower end of therapeutic range given anemia, risk for GIB.)  Daily CBC and heparin levels while on heparin infusion.  F/u plan per MD for continued anticoagulation with anemia.  Gretta Arab PharmD, BCPS Pager [phone] 08/07/2015 7:06 AM

## 2015-08-07 NOTE — Progress Notes (Signed)
[place] NOTE  Pharmacy Consult for TPN Indication: Intolerance to enteral feeding  Allergies  Allergen Reactions  . Sulfamethoxazole Rash    Patient Measurements: Height: 5\' 2"  (157.5 cm) Weight: 113 lb 15.7 oz (51.7 kg) IBW/kg (Calculated) : 50.1  Vital Signs: Temp: 97.7 F (36.5 C) (05/08 0553) Temp Source: Oral (05/08 0553) BP: 168/68 mmHg (05/08 0659) Pulse Rate: 66 (05/08 0553) Intake/Output from previous day: 05/07 0701 - 05/08 0700 In: 1703.9 [I.V.:1102.3; IV Piggyback:555; TPN:46.7] Out: -  Intake/Output from this shift:    Labs:  Recent Labs  08/05/15 0513 08/05/15 1754 08/06/15 0109 08/07/15 0354  WBC 7.5  --  6.5 5.7  HGB 12.1  --  10.1* 10.3*  HCT 37.0  --  31.5* 31.0*  PLT 195  --  187 190  APTT  --  40*  --   --      Recent Labs  08/05/15 0513 08/06/15 0109 08/07/15 0354  NA 137 128* 135  K 3.7 4.1 3.8  CL 104 101 103  CO2 19* 16* 25  GLUCOSE 82 81 114*  BUN 8 7 9   CREATININE 0.85 0.84 0.78  CALCIUM 9.1 8.1* 8.6*  MG 1.5* 1.9 1.8  PHOS  --  2.2* 3.6  PROT  --   --  5.2*  ALBUMIN  --   --  3.1*  AST  --   --  11*  ALT  --   --  7*  ALKPHOS  --   --  40  BILITOT  --   --  0.4   Estimated Creatinine Clearance: 45.8 mL/min (by C-G formula based on Cr of 0.78).    Recent Labs  08/06/15 2018 08/06/15 2330 08/07/15 0539  GLUCAP 125* 127* 110*     Insulin Requirements: Sensitive SSI: 1 unit given.  No history of diabetes  Current Nutrition: NPO  IVF:  NS w/ KCl 16meq/L @ 74ml/hr  Central access: PICC placed 5/7 TPN start date: 5/7  ASSESSMENT                                                                                                          HPI: 83 yoF with history of chronic diastolic HF, afib not on anticoag, anemia, hiatal hernia repair x2 (1992, 08/2014), colostomy for perforated diverticulum (03/2015), colonic mass, pulm HTN with mod TR, CAD with prior DES to LAD in 05/2011, and HTN.  She was  admitted 5/4 with intractable N/V and found to have large hiatal hernia, gastic outlet obstruction or gastric volvulus.  Significant events:  5/7 Start TPN, cadiothoracic surgery recommends transfer to [facility] for surgical repair of hiatal hernia d/t significant adhesions between stomach and pericardium.  Today:    Glucose: at goal, CBGs <150  Electrolytes:  WNL.  K 3.8 and Mag 1.8 at low end of therapeutic, Phos WNL (s/p replacement 5/8), CorrCa 9.3, phos 2.2  Renal: SCr 0.78  LFTs: Low or WNL  TGs - 92 (5/8)  Prealbumin - 15.6 (5/4), 10.7 (5/8)  NUTRITIONAL GOALS  RD recs: 50-60 g/day protein, Kcal 1250-1500/day Clinimix E 5/15 at a goal rate of 50ml/hr + 20% fat emulsion at 59ml/hr to provide: 60g/day protein, 1332Kcal/day.  PLAN                                                                                                                 Now:  KCl 10 mEq IV x2 runs, Mag 1g IV bolus   At 1800 today:  Advance Clinimix E 5/15 @ 13ml/hr  Continue 20% fat emulsion at 74ml/hr.  Plan to advance as tolerated to the goal rate, then convert to cyclic  TPN to contain standard multivitamins and trace elements.  Reduce IVF to 32ml/hr.  Continue sensitive SSI and CBGs q6h  TPN lab panels on Mondays & Thursdays.  F/u daily.   Gretta Arab PharmD, BCPS Pager [phone] 08/07/2015 7:34 AM

## 2015-08-07 NOTE — Progress Notes (Signed)
PROGRESS NOTE  [name] [id number] DOB: 1935-01-18 DOA: 08/03/2015 PCP: Terald Sleeper, PA-C  HPI/Recap of past 24 hours:   remain on cardizem/heparin drip due to not able to take oral meds   no N/V the last 24hrs, denies ab pain.  Assessment/Plan: Active Problems:   Vomiting   N&V (nausea and vomiting)   Uncontrollable vomiting   Gastric outlet obstruction   Irreducible hiatal hernia   TIA (transient ischemic attack)  Intractable N/V/large hiatal hernia/gastic outlet obstruction/gastric volvulus:  with normal colostomy function, lft wnl, no ab pain, ct ab concerning for gastric outlet obstruction, gastric volvulus,  EGD on 5/5 confirmed erosive esophagitis and gastric volvulus. General surgery consulted. Not able to tolerate clears, continue ivf. General surgery ordered picc line and tpn. Cardiothoracic surgery consulted , feel patient will benefit from tertiary care center. Upper GI imaging on 5/8 contrast material passes through,  I have discussed with general surgery Dr Zella Richer who recommended start trial of clears see if patient able to tolerate po, if patient tolerate po , she may be able to be discharged and have outpatient follow up with tertiary center surgical specialist.   Hypokalemia/hypomagnesemia: replace k,mag   Afib/RVR with h/o PAF, not on anticoagulation in the past due to h/o GI bleed anemia. initially in NSR, then in afib/RVR on cardizem drip, converted to NSR, remain on cardizem drip due to not able to tolerate oral intake,  Patient developed sign of TIA with tongue numbness and slurry speech on 5/6 , heparin drip started, patient and family aware the risk of bleed, they choose to continue anticoagulation and transfuse prn unless life threatening bleed, patient states " i do not want risk having stroke"  cardiology consulted, input appreciated.  HTN: uncontrolled due to not able to keep oral meds down, on cardizem drip and prn hydralazine for now  H/o  hiatal hernia, acid reflux, on iv pepcid  H/o colonic mass, general surgery following  UTI? Culture pending, empirically start rocephin    DVT prophylaxis: now on heparin drip  Consultants: general surgery, GI, cardiothoracic surgery, cardiology  Code Status: full   Family Communication: Patient   Disposition Plan: pending  Procedures:  EGD on 5/5  picc line /tpn  Antibiotics:  Rocephin from 5/6   Objective: BP 168/68 mmHg  Pulse 66  Temp(Src) 97.7 F (36.5 C) (Oral)  Resp 16  Ht 5\' 2"  (1.575 m)  Wt 51.7 kg (113 lb 15.7 oz)  BMI 20.84 kg/m2  SpO2 98%  Intake/Output Summary (Last 24 hours) at 08/07/15 0807 Last data filed at 08/06/15 2300  Gross per 24 hour  Intake 1603.91 ml  Output      0 ml  Net 1603.91 ml   Filed Weights   08/05/15 0359 08/06/15 0500 08/07/15 0553  Weight: 50.8 kg (111 lb 15.9 oz) 52 kg (114 lb 10.2 oz) 51.7 kg (113 lb 15.7 oz)    Exam:   General:  NAD  Cardiovascular: RRR  Respiratory: CTABL  Abdomen: Soft/ND/NT, positive BS, + colostomy  Musculoskeletal: No Edema  Neuro: aaox3  Data Reviewed: Basic Metabolic Panel:  Recent Labs Lab 08/03/15 1359 08/04/15 0443 08/05/15 0513 08/06/15 0109 08/07/15 0354  NA 137 138 137 128* 135  K 2.9* 3.8 3.7 4.1 3.8  CL 97* 103 104 101 103  CO2 24 22 19* 16* 25  GLUCOSE 113* 83 82 81 114*  BUN 14 12 8 7 9   CREATININE 1.28* 1.01* 0.85 0.84 0.78  CALCIUM 9.6  9.1 9.1 8.1* 8.6*  MG  --  1.7 1.5* 1.9 1.8  PHOS  --   --   --  2.2* 3.6   Liver Function Tests:  Recent Labs Lab 08/03/15 1359 08/04/15 0443 08/07/15 0354  AST 17 12* 11*  ALT 8* 6* 7*  ALKPHOS 64 53 40  BILITOT 1.1 0.8 0.4  PROT 6.8 6.0* 5.2*  ALBUMIN 4.2 3.5 3.1*    Recent Labs Lab 08/03/15 1359  LIPASE 18   No results for input(s): AMMONIA in the last 168 hours. CBC:  Recent Labs Lab 08/03/15 1358 08/04/15 0443 08/05/15 0513 08/06/15 0109 08/07/15 0354  WBC 10.1 8.2 7.5 6.5 5.7   NEUTROABS 6.6  --   --   --  3.7  HGB 13.1 11.5* 12.1 10.1* 10.3*  HCT 40.9 35.4* 37.0 31.5* 31.0*  MCV 79.7 78.1 79.1 77.4* 78.7  PLT 266 219 195 187 190   Cardiac Enzymes:   No results for input(s): CKTOTAL, CKMB, CKMBINDEX, TROPONINI in the last 168 hours. BNP (last 3 results)  Recent Labs  06/16/15 2233 06/19/15 0555  BNP 1250.0* 217.0*    ProBNP (last 3 results) No results for input(s): PROBNP in the last 8760 hours.  CBG:  Recent Labs Lab 08/06/15 2018 08/06/15 2330 08/07/15 0539  GLUCAP 125* 127* 110*    Recent Results (from the past 240 hour(s))  Urine culture     Status: Abnormal   Collection Time: 08/03/15  2:03 PM  Result Value Ref Range Status   Specimen Description URINE, CLEAN CATCH  Final   Special Requests NONE  Final   Culture MULTIPLE SPECIES PRESENT, SUGGEST RECOLLECTION (A)  Final   Report Status 08/05/2015 FINAL  Final     Studies: No results found.  Scheduled Meds: . cefTRIAXone (ROCEPHIN)  IV  1 g Intravenous Q24H  . famotidine (PEPCID) IV  20 mg Intravenous Q12H  . insulin aspart  0-9 Units Subcutaneous Q6H  . sodium chloride flush  3 mL Intravenous Q12H    Continuous Infusions: . 0.9 % NaCl with KCl 20 mEq / L 35 mL/hr at 08/06/15 1824  . diltiazem (CARDIZEM) infusion 5 mg/hr (08/06/15 1323)  . Marland KitchenTPN (CLINIMIX-E) Adult 40 mL/hr at 08/06/15 1801   And  . fat emulsion 240 mL (08/06/15 1801)  . heparin 850 Units/hr (08/06/15 2342)     Time spent: 46mins  [name] MD, PhD  [facility] Pager [phone]. If 7PM-7AM, please contact night-coverage at [email or web address], password [facility] 08/07/2015, 8:07 AM  LOS: 4 days

## 2015-08-07 NOTE — Progress Notes (Signed)
*  PRELIMINARY RESULTS* Vascular Ultrasound Carotid Duplex (Doppler) has been completed.  Preliminary findings: Bilateral: No significant (1-39%) ICA stenosis. Antegrade vertebral flow.    Landry Mellow, RDMS, RVT   08/07/2015, 9:37 AM

## 2015-08-07 NOTE — Progress Notes (Signed)
3 Days Post-Op  Subjective: No abdominal pain.  Denies vomiting.  In radiology getting ready for UGI. Objective: Vital signs in last 24 hours: Temp:  [97.7 F (36.5 C)-98.5 F (36.9 C)] 97.7 F (36.5 C) (05/08 0553) Pulse Rate:  [66-77] 66 (05/08 0553) Resp:  [16] 16 (05/08 0553) BP: (168-190)/(66-76) 168/68 mmHg (05/08 0659) SpO2:  [98 %-99 %] 98 % (05/08 0553) Weight:  [51.7 kg (113 lb 15.7 oz)] 51.7 kg (113 lb 15.7 oz) (05/08 0553) Last BM Date: 08/04/15  Intake/Output from previous day: 05/07 0701 - 05/08 0700 In: 1703.9 [I.V.:1102.3; IV Piggyback:555; TPN:46.7] Out: -  Intake/Output this shift:    PE: General- In NAD Abdomen-soft, not tender  Lab Results:   Recent Labs  08/06/15 0109 08/07/15 0354  WBC 6.5 5.7  HGB 10.1* 10.3*  HCT 31.5* 31.0*  PLT 187 190   BMET  Recent Labs  08/06/15 0109 08/07/15 0354  NA 128* 135  K 4.1 3.8  CL 101 103  CO2 16* 25  GLUCOSE 81 114*  BUN 7 9  CREATININE 0.84 0.78  CALCIUM 8.1* 8.6*   PT/INR No results for input(s): LABPROT, INR in the last 72 hours. Comprehensive Metabolic Panel:    Component Value Date/Time   NA 135 08/07/2015 0354   NA 128* 08/06/2015 0109   K 3.8 08/07/2015 0354   K 4.1 08/06/2015 0109   CL 103 08/07/2015 0354   CL 101 08/06/2015 0109   CO2 25 08/07/2015 0354   CO2 16* 08/06/2015 0109   BUN 9 08/07/2015 0354   BUN 7 08/06/2015 0109   CREATININE 0.78 08/07/2015 0354   CREATININE 0.84 08/06/2015 0109   GLUCOSE 114* 08/07/2015 0354   GLUCOSE 81 08/06/2015 0109   CALCIUM 8.6* 08/07/2015 0354   CALCIUM 8.1* 08/06/2015 0109   AST 11* 08/07/2015 0354   AST 12* 08/04/2015 0443   ALT 7* 08/07/2015 0354   ALT 6* 08/04/2015 0443   ALKPHOS 40 08/07/2015 0354   ALKPHOS 53 08/04/2015 0443   BILITOT 0.4 08/07/2015 0354   BILITOT 0.8 08/04/2015 0443   PROT 5.2* 08/07/2015 0354   PROT 6.0* 08/04/2015 0443   ALBUMIN 3.1* 08/07/2015 0354   ALBUMIN 3.5 08/04/2015 0443      Studies/Results: Ct Head Wo Contrast  08/05/2015  CLINICAL DATA:  Slurred speech and tongue numbness lasting 5 minutes, almost resolved, history atrial fibrillation, high risk of TIA and stroke, was off anti coagulation due to history of GI bleed, history CHF, pulmonary hypertension, hypertension, former smoker EXAM: CT HEAD WITHOUT CONTRAST TECHNIQUE: Contiguous axial images were obtained from the base of the skull through the vertex without intravenous contrast. COMPARISON:  None FINDINGS: Generalized atrophy. Normal ventricular morphology. No midline shift or mass effect. Mild small vessel chronic ischemic changes of deep cerebral white matter. No intracranial hemorrhage, mass lesion, or evidence of acute infarction. No extra-axial fluid collections. Atherosclerotic calcifications at carotid siphons and vertebral arteries. Bones and sinuses unremarkable. IMPRESSION: Atrophy with small vessel chronic ischemic changes of deep cerebral white matter. No acute intracranial abnormalities. Electronically Signed   By: Lavonia Dana M.D.   On: 08/05/2015 16:53    Anti-infectives: Anti-infectives    Start     Dose/Rate Route Frequency Ordered Stop   08/05/15 0900  cefTRIAXone (ROCEPHIN) 1 g in dextrose 5 % 50 mL IVPB     1 g 100 mL/hr over 30 Minutes Intravenous Every 24 hours 08/05/15 0804        Assessment Active  Problems:   N&V (nausea and vomiting) due to gastric volvulus from recurrent hiatal hernia:  Dr. Silverio Decamp felt she reduced this during EGD; there was food and liquid in stomach; her recurrent hiatal hernia was not reducible/repairable via a abdominal approach 11 months ago-episode of n/v this AM; per daughter, Dr. Prescott Gum felt this would need to be repaired via a thoracoabdominal approach at a medical center.   Anemia felt to be due to intermittent bleeding from ascending colon tubulovillous adenoma (no high-grade dysplasia or malignancy)-hemoglobin dropped 2 grams since Heparin drip  started, but is now stable; hemoccult of stool negative  Symptomatic cholelithiasis-has been asx recently  Poorly controlled HTN  Afib with TIA type symptoms 08/05/15  PC malnutrition-TPN started    LOS: 4 days   Plan:  Check UGI to see if stomach empties and check orientation of stomach.  Work toward cycling TPN at night.  Rocky Gladden J 08/07/2015

## 2015-08-07 NOTE — Progress Notes (Signed)
Brief pharmacy anticoagulation note:  For full details please see note from [facility] D from earlier today.  HL 0.49 @ 15.55 today  Goal of Therapy:  Heparin level 0.3-0.5 units/ml given anemia, risk for GIB Monitor platelets by anticoagulation protocol: Yes  Plan:   Continue heparin infusion at 850 units/hr.  Daily CBC and heparin levels while on heparin infusion.  F/u plan per MD for continued anticoagulation with anemia  Dolly Rias RPh 08/07/2015, 5:00 PM Pager [phone]

## 2015-08-08 DIAGNOSIS — K449 Diaphragmatic hernia without obstruction or gangrene: Secondary | ICD-10-CM | POA: Insufficient documentation

## 2015-08-08 DIAGNOSIS — R4781 Slurred speech: Secondary | ICD-10-CM | POA: Insufficient documentation

## 2015-08-08 LAB — CBC
HEMATOCRIT: 30.8 % — AB (ref 36.0–46.0)
HEMOGLOBIN: 10.2 g/dL — AB (ref 12.0–15.0)
MCH: 26.1 pg (ref 26.0–34.0)
MCHC: 33.1 g/dL (ref 30.0–36.0)
MCV: 78.8 fL (ref 78.0–100.0)
Platelets: 202 10*3/uL (ref 150–400)
RBC: 3.91 MIL/uL (ref 3.87–5.11)
RDW: 16.9 % — ABNORMAL HIGH (ref 11.5–15.5)
WBC: 7.3 10*3/uL (ref 4.0–10.5)

## 2015-08-08 LAB — PHOSPHORUS: PHOSPHORUS: 3.3 mg/dL (ref 2.5–4.6)

## 2015-08-08 LAB — BASIC METABOLIC PANEL
Anion gap: 6 (ref 5–15)
BUN: 16 mg/dL (ref 6–20)
CALCIUM: 8.9 mg/dL (ref 8.9–10.3)
CO2: 24 mmol/L (ref 22–32)
Chloride: 103 mmol/L (ref 101–111)
Creatinine, Ser: 0.77 mg/dL (ref 0.44–1.00)
GLUCOSE: 124 mg/dL — AB (ref 65–99)
POTASSIUM: 4.3 mmol/L (ref 3.5–5.1)
SODIUM: 133 mmol/L — AB (ref 135–145)

## 2015-08-08 LAB — GLUCOSE, CAPILLARY
GLUCOSE-CAPILLARY: 116 mg/dL — AB (ref 65–99)
GLUCOSE-CAPILLARY: 117 mg/dL — AB (ref 65–99)
GLUCOSE-CAPILLARY: 127 mg/dL — AB (ref 65–99)

## 2015-08-08 LAB — OCCULT BLOOD X 1 CARD TO LAB, STOOL: FECAL OCCULT BLD: NEGATIVE

## 2015-08-08 LAB — HEPARIN LEVEL (UNFRACTIONATED): HEPARIN UNFRACTIONATED: 0.33 [IU]/mL (ref 0.30–0.70)

## 2015-08-08 LAB — MAGNESIUM: MAGNESIUM: 2 mg/dL (ref 1.7–2.4)

## 2015-08-08 MED ORDER — TRACE MINERALS CR-CU-MN-SE-ZN 10-1000-500-60 MCG/ML IV SOLN
INTRAVENOUS | Status: AC
  Administered 2015-08-08: 18:00:00 via INTRAVENOUS
  Filled 2015-08-08: qty 1440

## 2015-08-08 MED ORDER — ONDANSETRON 4 MG PO TBDP
4.0000 mg | ORAL_TABLET | ORAL | Status: DC | PRN
  Administered 2015-08-08: 4 mg via ORAL
  Filled 2015-08-08: qty 1

## 2015-08-08 MED ORDER — FAT EMULSION 20 % IV EMUL
240.0000 mL | INTRAVENOUS | Status: AC
  Administered 2015-08-08: 240 mL via INTRAVENOUS
  Filled 2015-08-08: qty 250

## 2015-08-08 MED ORDER — APIXABAN 2.5 MG PO TABS
2.5000 mg | ORAL_TABLET | Freq: Two times a day (BID) | ORAL | Status: DC
  Administered 2015-08-08 – 2015-08-09 (×2): 2.5 mg via ORAL
  Filled 2015-08-08 (×3): qty 1

## 2015-08-08 MED ORDER — LOSARTAN POTASSIUM 50 MG PO TABS
50.0000 mg | ORAL_TABLET | Freq: Every day | ORAL | Status: DC
  Administered 2015-08-08 – 2015-08-09 (×2): 50 mg via ORAL
  Filled 2015-08-08 (×2): qty 1

## 2015-08-08 MED ORDER — DILTIAZEM HCL ER COATED BEADS 240 MG PO CP24
240.0000 mg | ORAL_CAPSULE | Freq: Every day | ORAL | Status: DC
  Administered 2015-08-08 – 2015-08-09 (×2): 240 mg via ORAL
  Filled 2015-08-08 (×2): qty 1

## 2015-08-08 NOTE — Progress Notes (Signed)
Nutrition Follow-up  DOCUMENTATION CODES:   Severe malnutrition in context of chronic illness  INTERVENTION:  - Continue TPN regimen/cyclic TPN starting tonight per pharmacy - Continue FLD; will monitor if this will be d/c diet or if diet will be advanced - RD to provide education related to appropriate diet prior to d/c  NUTRITION DIAGNOSIS:   Malnutrition related to chronic illness as evidenced by severe depletion of muscle mass, severe depletion of body fat. -ongoing  GOAL:   Patient will meet greater than or equal to 90% of their needs -met with TPN and minimal PO intakes  MONITOR:   PO intake, Diet advancement, Weight trends, Labs, Skin, I & O's, Other (Comment) (TPN regimen)  ASSESSMENT:   [name] is a 81 yo female H/o PAF , not on anticoagulation due to gi bleed/anemia, actively followed by cardiology, h/o larger hiatal hernia, h/o perforated diverticulitis s/p resection and colostomy, h/o right colon mass under evaluation for surgical resection who developed intractable n/v for the last three weeks, she was seen by gi and general surgery but her symptoms became worse, she presented to [place] ED  5/9 Pt's diet advanced from NPO to CLD yesterday evening and to FLD this AM; no intakes documented. Pt consuming vanilla ice cream during visit and states no issues with the first few bites. Pt states that she has felt slightly queasy this AM with no vomiting and no abdominal pain. She states that she is unsure of how much to eat at a time. Encouraged pt to eat small amounts (~1/2 cup) every 2 hours. Informed her that if she begins to feel an increase in nausea or feels abdominal pain to cut back on intakes. Will monitor for plan for home diet after d/c and informed pt that RD would provide education related to the same.   Pt currently receiving Clinimix E 5/15 @ 60 mL/hr with 20% lipids @ 10 mL/hr which is providing 1502 kcal, 72 grams of protein and meeting needs. Plan for  cyclic TPN starting tonight. Pt had UGI yesterday which showed large hiatal hernia.  Per pharmacy note today at [phone]: PLAN  At 1800 today:  Change to cyclic TPN tonight, start with 18 hour cycle  Infuse total of 1440 mL over 18 hrs : 50 mL/hr x 1 hr, then 84 mL/hr x 16 hrs, then 50 mL/hr x 1 hr.  Cycle 20% fat emulsion, infuse at 38m/hr x 18 hours.  Plan to cycle further over 12 hours as tolerated.  TPN to contain standard multivitamins and trace elements.  Reduce IVF to 110mhr.  Continue sensitive SSI and CBGs q6h  TPN lab panels on Mondays & Thursdays.   RD will see pt for follow-up 5/10. Medications reviewed. IVF: NS-20 mEq KCl @ 15 mL/hr (decrease) starting today. Labs reviewed; CBGs: 116 and 117 mg/dL this AM, Na: 133 mmol/L.     5/8 - Pt reports that she is feeling hungry today; noted pt had EGD 5/5 which showed gastric volvulus and erosive esophagitis.  - Pt states that she has been experiencing throat pain but feels it is related to probe during EGD. - She denies abdominal pain or N/V since previous RD visit.  - She has been consuming ice chips today without issue. - Per notes, pt was to have UGI this AM; will monitor for results. - Per surgery notes, [facility]  - Nutrition needs slightly adjusted this assessment and pharmacist alerted. -  Pt had PICC placed and TPN initiated yesterday at 1800.  - She is currently receiving Clinimix E 5/15 @ 40 mL/hr with 20% lipids @ 10 mL/hr.  - Per pharmacy note, plan to advance to goal TPN tonight: Clinimix E 5/15 @ 60 mL/hr with 20% lipids @ 10 mL/hr which will provide 72 grams protein (100% minimum re-estimated protein needs) and 1502 kcal (100% re-estimated kcal needs).    5/5 - Pt has been experiencing intractable nausea/vomiting for the 3 weeks  PTA. - She states that she has not felt good since December.  - During this time span she has seen her weight drop from 153# - 110, a 43#/28% severe wt loss for that time span. - She underwent an EGD today; she states that she feels much better in general and was in the process of ordering food during RD visit.  - She stated that this is the first time she has had an appetite since December. - Nutrition-Focused physical exam completed. Findings are severe fat depletion, severe muscle depletion, and no edema.   Diet Order:  TPN (CLINIMIX-E) Adult Diet full liquid Room service appropriate?: Yes; Fluid consistency:: Thin TPN (CLINIMIX-E) Adult  Skin:  Wound (see comment) (MSAD)  Last BM:  5/8  Height:   Ht Readings from Last 1 Encounters:  08/03/15 5' 2"  (1.575 m)    Weight:   Wt Readings from Last 1 Encounters:  08/08/15 115 lb 9.6 oz (52.436 kg)    Ideal Body Weight:  50 kg  BMI:  Body mass index is 21.14 kg/(m^2).  Estimated Nutritional Needs:   Kcal:  1300-1550 (25-30 kcal/kg)  Protein:  70-80 grams (1.35-1.55 grams/kg)  Fluid:  >/= 1.5 L/day  EDUCATION NEEDS:   Education needs addressed     Jarome Matin, RD, LDN Inpatient Clinical Dietitian Pager # [phone] After hours/weekend pager # [phone]

## 2015-08-08 NOTE — Discharge Instructions (Signed)

## 2015-08-08 NOTE — Progress Notes (Signed)
ANTICOAGULATION CONSULT NOTE - Follow Up Consult  Pharmacy Consult for Apixaban Indication: atrial fibrillation  Allergies  Allergen Reactions  . Sulfamethoxazole Rash    Patient Measurements: Height: 5\' 2"  (157.5 cm) Weight: 115 lb 9.6 oz (52.436 kg) IBW/kg (Calculated) : 50.1 Heparin Dosing Weight: using actual body weight  Vital Signs: Temp: 98.1 F (36.7 C) (05/09 1345) Temp Source: Oral (05/09 1345) BP: 158/60 mmHg (05/09 1345) Pulse Rate: 81 (05/09 1345)  Labs:  Recent Labs  08/05/15 1754  08/06/15 0109  08/07/15 0354 08/07/15 1555 08/08/15 0500  HGB  --   < > 10.1*  --  10.3*  --  10.2*  HCT  --   --  31.5*  --  31.0*  --  30.8*  PLT  --   --  187  --  190  --  202  APTT 40*  --   --   --   --   --   --   HEPARINUNFRC  --   --  0.16*  < > 0.30 0.49 0.33  CREATININE  --   --  0.84  --  0.78  --  0.77  < > = values in this interval not displayed.  Estimated Creatinine Clearance: 45.8 mL/min (by C-G formula based on Cr of 0.77).   Assessment: 38 yoF with history of chronic diastolic HF, afib not on anticoag or aspirin due to issues with anemia in the past, colostomy, history of colonic mass followed by gen surg, pulm HTN, TR, CAD with prior DES to LAD in 05/2011, and HTN.  She was admitted on 5/4 with intractable N/V. She has a significant hiatal hernia despite 2 previous open repairs, now with obstruction, that [facility]  Cardiology consulted for onset of afib with RVR. On 5/6, cardiology's recommendation was to continue to hold staring anticoagulation or aspirin due to history of anemia.    Significant events: 5/6 PM: patient reported slurred speech and tongue numbness that lasted ~5 minutes.  Per TRH, given no history of GIB and currently stable CBC (Hgb and Plts WNL), would like to start heparin infusion for afib with high risk of TIA/stroke.  CT head negative for bleed, no acute intracranial  abnormalities.  Discussed plan with MD, appears that symptoms have resolved and likely TIA so patient will not be candidate to eval for tPA.  Proceed with beginning heparin infusion but will not bolus given concerns with starting an anticoagulant.  Baseline INR 1.19, aPTT 40 (collected 10 min after heparin started). 5/9 Transitioning to PO medications; start low-dose apixaban.  Today, 08/08/2015:  Heparin level 0.33, therapeutic on heparin at 8.5 ml/hr  CBC: Hgb 10.2, now stable in 10's range.  Decreased from ~12 after starting heparin on 5/6, but since has remained fairly stable.  Platelets WNL.  Anemia felt to be due to intermittent bleeding from ascending colon tubulovillous adenoma.  Surgery and TRH discussed per notes and felt anticoagulation is necessary to avoid stroke and may need intermittent transfusions until surgery performed.    Occult stool negative (5/8).  No bleeding or IV line complications reported  Goal of Therapy:  Heparin level 0.3-0.5 units/ml given anemia, risk for GIB Monitor platelets by anticoagulation protocol: Yes   Plan:   At 1800 d/c heparin infusion  At 1800 give first dose of Apixaban 2.5 mg PO BID  Daily CBC and monitor for s/s bleeding.  Pharmacist to provide education prior to discharge.  Altha Harm  Bryer Cozzolino PharmD, BCPS Pager [phone] 08/08/2015 2:54 PM

## 2015-08-08 NOTE — Progress Notes (Signed)
4 Days Post-Op  Subjective: Some nausea but no vomiting.  UGI shows large hiatal hernia.  Contrast passes into small intestine. Objective: Vital signs in last 24 hours: Temp:  [97.7 F (36.5 C)-98.6 F (37 C)] 98.6 F (37 C) (05/09 0553) Pulse Rate:  [73-85] 73 (05/09 0553) Resp:  [18-22] 18 (05/09 0553) BP: (153-197)/(62-98) 153/75 mmHg (05/09 0553) SpO2:  [95 %-100 %] 96 % (05/09 0553) Weight:  [52.436 kg (115 lb 9.6 oz)] 52.436 kg (115 lb 9.6 oz) (05/09 0659) Last BM Date: 08/07/15  Intake/Output from previous day: 05/08 0701 - 05/09 0700 In: 16140.9 [P.O.:240; I.V.:14239.1; IV Piggyback:150; TPN:1511.8] Out: -  Intake/Output this shift:    PE: General- Appears tired and frail   Lab Results:   Recent Labs  08/07/15 0354 08/08/15 0500  WBC 5.7 7.3  HGB 10.3* 10.2*  HCT 31.0* 30.8*  PLT 190 202   BMET  Recent Labs  08/07/15 0354 08/08/15 0500  NA 135 133*  K 3.8 4.3  CL 103 103  CO2 25 24  GLUCOSE 114* 124*  BUN 9 16  CREATININE 0.78 0.77  CALCIUM 8.6* 8.9   PT/INR No results for input(s): LABPROT, INR in the last 72 hours. Comprehensive Metabolic Panel:    Component Value Date/Time   NA 133* 08/08/2015 0500   NA 135 08/07/2015 0354   K 4.3 08/08/2015 0500   K 3.8 08/07/2015 0354   CL 103 08/08/2015 0500   CL 103 08/07/2015 0354   CO2 24 08/08/2015 0500   CO2 25 08/07/2015 0354   BUN 16 08/08/2015 0500   BUN 9 08/07/2015 0354   CREATININE 0.77 08/08/2015 0500   CREATININE 0.78 08/07/2015 0354   GLUCOSE 124* 08/08/2015 0500   GLUCOSE 114* 08/07/2015 0354   CALCIUM 8.9 08/08/2015 0500   CALCIUM 8.6* 08/07/2015 0354   AST 11* 08/07/2015 0354   AST 12* 08/04/2015 0443   ALT 7* 08/07/2015 0354   ALT 6* 08/04/2015 0443   ALKPHOS 40 08/07/2015 0354   ALKPHOS 53 08/04/2015 0443   BILITOT 0.4 08/07/2015 0354   BILITOT 0.8 08/04/2015 0443   PROT 5.2* 08/07/2015 0354   PROT 6.0* 08/04/2015 0443   ALBUMIN 3.1* 08/07/2015 0354   ALBUMIN 3.5  08/04/2015 0443     Studies/Results: Dg Ugi W/high Density W/kub  08/07/2015  CLINICAL DATA:  Hiatal hernia. EXAM: UPPER GI SERIES WITH KUB TECHNIQUE: After obtaining a scout radiograph a routine upper GI series was performed using thin barium. FLUOROSCOPY TIME:  Radiation Exposure Index (as provided by the fluoroscopic device): If the device does not provide the exposure index: Fluoroscopy Time (in minutes and seconds): 1 minutes and 36 seconds. Number of Acquired Images: COMPARISON:  CT scan 08/03/2015. FINDINGS: Preprocedure KUB shows surgical clips in the region of the esophagogastric junction and in the lower right chest/mediastinum. Bowel gas pattern is nonspecific. Patient was given thin barium by mouth which passes readily through a somewhat tortuous appearing esophagus. No gross esophageal diverticulum or mass lesion. Contrast then flows into a large hiatal hernia seen just to the right of midline in the lower chest. Contrast material is able to pass through the diaphragmatic hiatus into the antral region of the stomach which appears be below the hiatus. Roughly 2/3 to 3/4 of the stomach is above the hiatus. Contrast does pass through the pylorus into the proximal small bowel which is nondilated. Patient was given a 13 mm barium tablet which lodged briefly at the level of the distal  esophagus but then passed into the stomach with additional swallows of water. IMPRESSION: Large hiatal hernia without complete mechanical obstruction. Contrast material passes into the distal stomach and proximal small bowel. Electronically Signed   By: Misty Stanley M.D.   On: 08/07/2015 10:17    Anti-infectives: Anti-infectives    Start     Dose/Rate Route Frequency Ordered Stop   08/05/15 0900  cefTRIAXone (ROCEPHIN) 1 g in dextrose 5 % 50 mL IVPB     1 g 100 mL/hr over 30 Minutes Intravenous Every 24 hours 08/05/15 0804        Assessment Active Problems:   N&V (nausea and vomiting) due to gastric volvulus  from recurrent hiatal hernia:  Dr. Silverio Decamp felt she reduced this during EGD; there was food and liquid in stomach; her recurrent hiatal hernia was not reducible/repairable via a abdominal approach 11 months ago-episode of n/v this AM;  Dr. Prescott Gum feels this would need to be repaired via a thoracoabdominal approach at a medical center.  No gastric outlet obstruction currently   Anemia felt to be due to intermittent bleeding from ascending colon tubulovillous adenoma (no high-grade dysplasia or malignancy)-hemoglobin dropped 2 grams since Heparin drip started, but is now stable; hemoccult of stool negative  Symptomatic cholelithiasis-has been asx recently  Poorly controlled HTN  Afib with TIA type symptoms 08/05/15  PC malnutrition-TPN started    LOS: 5 days   Plan:  Would try to convert all meds to oral, Zofran ODT for nausea, cycle TPN at night.  Once above is done satisfactorily, could discharge to home and see a thoracic surgeon at one of the medical centers for consultation.  Dymon Summerhill J 08/08/2015

## 2015-08-08 NOTE — Progress Notes (Signed)
Subjective: No complaints.  Objective: Vital signs in last 24 hours: Temp:  [97.7 F (36.5 C)-98.6 F (37 C)] 98.6 F (37 C) (05/09 0553) Pulse Rate:  [73-85] 73 (05/09 0553) Resp:  [18-22] 18 (05/09 0553) BP: (153-197)/(62-98) 153/75 mmHg (05/09 0553) SpO2:  [95 %-100 %] 96 % (05/09 0553) Weight:  [115 lb 9.6 oz (52.436 kg)] 115 lb 9.6 oz (52.436 kg) (05/09 0659) Last BM Date: 08/07/15  Intake/Output from previous day: 05/08 0701 - 05/09 0700 In: 16140.9 [P.O.:240; I.V.:14239.1; IV Piggyback:150; TPN:1511.8] Out: -  Intake/Output this shift:    Medications Scheduled Meds: . cefTRIAXone (ROCEPHIN)  IV  1 g Intravenous Q24H  . famotidine (PEPCID) IV  20 mg Intravenous Q12H  . insulin aspart  0-9 Units Subcutaneous Q6H  . isosorbide mononitrate  30 mg Oral Daily  . sodium chloride flush  3 mL Intravenous Q12H   Continuous Infusions: . 0.9 % NaCl with KCl 20 mEq / L 15 mL/hr at 08/07/15 1741  . diltiazem (CARDIZEM) infusion 10 mg/hr (08/08/15 0727)  . Marland KitchenTPN (CLINIMIX-E) Adult 60 mL/hr at 08/07/15 1732   And  . fat emulsion 240 mL (08/07/15 1732)  . heparin 850 Units/hr (08/08/15 0726)   PRN Meds:.ALPRAZolam, hydrALAZINE, nitroGLYCERIN, ondansetron (ZOFRAN) IV, prochlorperazine, sodium chloride flush, zolpidem  PE: General appearance: alert, cooperative and no distress.  She was sleeping when I entered Lungs: Decerased BS at the right base Heart: regular rate and rhythm, S1, S2 normal, no murmur, click, rub or gallop Extremities: No LEE Pulses: 2+ and symmetric Skin: Warm and dry Neurologic: Grossly normal  Lab Results:   Recent Labs  08/06/15 0109 08/07/15 0354 08/08/15 0500  WBC 6.5 5.7 7.3  HGB 10.1* 10.3* 10.2*  HCT 31.5* 31.0* 30.8*  PLT 187 190 202   BMET  Recent Labs  08/06/15 0109 08/07/15 0354 08/08/15 0500  NA 128* 135 133*  K 4.1 3.8 4.3  CL 101 103 103  CO2 16* 25 24  GLUCOSE 81 114* 124*  BUN 7 9 16   CREATININE 0.84 0.78 0.77    CALCIUM 8.1* 8.6* 8.9     Assessment/Plan   Active Problems:   Vomiting   N&V (nausea and vomiting)   Uncontrollable vomiting   Gastric outlet obstruction   Irreducible hiatal hernia   TIA (transient ischemic attack)   Protein-calorie malnutrition, severe  81 y.o.female with history of chronic diastolic HF, afib not on anticoag due to issues with anemia in the past, colostomy, history of colonic mass followed by gen surg, pulm HTN, TR, CAD with prior DES to LAD in 05/2011, and HTN. From clinic notes off ASA as well due to issues with anemia. She (81) was admitted with intractable N/V.    N&V (nausea and vomiting)  Gastric outlet obstruction  Irreducible hiatal hernia  TIA (transient ischemic attack)   A-fib RVR Now in NSR on IV cardizem at 10mg /hr. She does have periodic Afib on tele but nothing sustained for a long period.  Not anticoagulated long term due to history of GIB and anemia.She is tolerating heparin however.   Per Dr. Zella Richer, he would like the patient changed to PO meds.  Change cardizem to PO 240 daily.    HTN     [facility].    LOS: 5 days   History and all data above reviewed.  Patient examined.  I agree with the findings as above.  The patient exam reveals COR:RRR  ,  Lungs:  Clear  ,  Abd: Positive bowel sounds, no rebound no guarding, Ext No edema  .  All available labs, radiology testing, previous records reviewed. Agree with documented assessment and plan. Switch to PO Cardizem.  Will also start a lower dose of her home Cozaar for BP control.  [name]  11:22 AM  08/08/2015   Heather Fuller PA-C 08/08/2015 7:50 AM

## 2015-08-08 NOTE — Progress Notes (Signed)
ANTICOAGULATION CONSULT NOTE - Follow Up Consult  Pharmacy Consult for Heparin Indication: atrial fibrillation  Allergies  Allergen Reactions  . Sulfamethoxazole Rash    Patient Measurements: Height: 5\' 2"  (157.5 cm) Weight: 115 lb 9.6 oz (52.436 kg) IBW/kg (Calculated) : 50.1 Heparin Dosing Weight: using actual body weight  Vital Signs: Temp: 98.6 F (37 C) (05/09 0553) Temp Source: Oral (05/09 0553) BP: 153/75 mmHg (05/09 0553) Pulse Rate: 73 (05/09 0553)  Labs:  Recent Labs  08/05/15 1754  08/06/15 0109  08/07/15 0354 08/07/15 1555 08/08/15 0500  HGB  --   < > 10.1*  --  10.3*  --  10.2*  HCT  --   --  31.5*  --  31.0*  --  30.8*  PLT  --   --  187  --  190  --  202  APTT 40*  --   --   --   --   --   --   HEPARINUNFRC  --   --  0.16*  < > 0.30 0.49 0.33  CREATININE  --   --  0.84  --  0.78  --  0.77  < > = values in this interval not displayed.  Estimated Creatinine Clearance: 45.8 mL/min (by C-G formula based on Cr of 0.77).   Assessment: 25 yoF with history of chronic diastolic HF, afib not on anticoag or aspirin due to issues with anemia in the past, colostomy, history of colonic mass followed by gen surg, pulm HTN, TR, CAD with prior DES to LAD in 05/2011, and HTN.  She was admitted on 5/4 with intractable N/V. She has a significant hiatal hernia despite 2 previous open repairs, now with obstruction, [facility]  Cardiology consulted for onset of afib with RVR. On 5/6, cardiology's recommendation was to continue to hold staring anticoagulation or aspirin due to history of anemia.    Significant events: 5/6 PM: patient reported slurred speech and tongue numbness that lasted ~5 minutes.  Per TRH, given no history of GIB and currently stable CBC (Hgb and Plts WNL), would like to start heparin infusion for afib with high risk of TIA/stroke.  CT head negative for bleed, no acute intracranial  abnormalities.  Discussed plan with MD, appears that symptoms have resolved and likely TIA so patient will not be candidate to eval for tPA.  Proceed with beginning heparin infusion but will not bolus given concerns with starting an anticoagulant.  Baseline INR 1.19, aPTT 40 (collected 10 min after heparin started).  Today, 08/08/2015:  Heparin level 0.33, therapeutic on heparin at 8.5 ml/hr  CBC: Hgb 10.2, now stable in 10's range.  Decreased from ~12 after starting heparin on 5/6, but since has remained fairly stable.  Platelets WNL.  Anemia felt to be due to intermittent bleeding from ascending colon tubulovillous adenoma.  Surgery and TRH discussed per notes and felt anticoagulation is necessary to avoid stroke and may need intermittent transfusions until surgery performed.    Occult stool negative (5/8).  No bleeding or IV line complications reported  Goal of Therapy:  Heparin level 0.3-0.5 units/ml given anemia, risk for GIB Monitor platelets by anticoagulation protocol: Yes   Plan:   Continue heparin infusion at 850 units/hr.  Daily CBC and heparin levels while on heparin infusion.  Aim for heparin levels at lower end of therapeutic range given anemia, risk for GIB.  F/u plan per MD for continued anticoagulation with anemia, and  surgical plans.  Gretta Arab PharmD, BCPS Pager [phone] 08/08/2015 7:25 AM

## 2015-08-08 NOTE — Care Management Note (Signed)
[facility] Management Note  Patient Details  Name: Mahagony Kowal Lovin MRN: [id number] Date of Birth: April 29, 1934  Subjective/Objective:  81 y/o f admitted w/vomiting.On TPN cyclic. If home on TPN will need home TPN-HHRN orders to manage, & TPN formula orders,flushes if needed.                  Action/Plan:d/c plan home.   Expected Discharge Date:   (UNKNOWN)               Expected Discharge Plan:  [place]  In-House Referral:     Discharge planning Services  CM Consult  Post Acute Care Choice:    Choice offered to:     DME Arranged:    DME Agency:     HH Arranged:    [place] Agency:     Status of Service:  In process, will continue to follow  Medicare Important Message Given:  Yes Date Medicare IM Given:    Medicare IM give by:    Date Additional Medicare IM Given:    Additional Medicare Important Message give by:     If discussed at [place] of Stay Meetings, dates discussed:    Additional Comments:  Dessa Phi, RN 08/08/2015, 1:44 PM

## 2015-08-08 NOTE — Progress Notes (Signed)
[place] NOTE  Pharmacy Consult for TPN Indication: Intolerance to enteral feeding  Allergies  Allergen Reactions  . Sulfamethoxazole Rash    Patient Measurements: Height: 5\' 2"  (157.5 cm) Weight: 115 lb 9.6 oz (52.436 kg) IBW/kg (Calculated) : 50.1  Vital Signs: Temp: 98.6 F (37 C) (05/09 0553) Temp Source: Oral (05/09 0553) BP: 153/75 mmHg (05/09 0553) Pulse Rate: 73 (05/09 0553) Intake/Output from previous day: 05/08 0701 - 05/09 0700 In: 16140.9 [P.O.:240; I.V.:14239.1; IV Piggyback:150; TPN:1511.8] Out: -  Intake/Output from this shift:    Labs:  Recent Labs  08/05/15 1754 08/06/15 0109 08/07/15 0354 08/08/15 0500  WBC  --  6.5 5.7 7.3  HGB  --  10.1* 10.3* 10.2*  HCT  --  31.5* 31.0* 30.8*  PLT  --  187 190 202  APTT 40*  --   --   --      Recent Labs  08/06/15 0109 08/07/15 0354 08/08/15 0500  NA 128* 135 133*  K 4.1 3.8 4.3  CL 101 103 103  CO2 16* 25 24  GLUCOSE 81 114* 124*  BUN 7 9 16   CREATININE 0.84 0.78 0.77  CALCIUM 8.1* 8.6* 8.9  MG 1.9 1.8  --   PHOS 2.2* 3.6  --   PROT  --  5.2*  --   ALBUMIN  --  3.1*  --   AST  --  11*  --   ALT  --  7*  --   ALKPHOS  --  40  --   BILITOT  --  0.4  --   PREALBUMIN  --  10.7*  --   TRIG  --  92  --    Estimated Creatinine Clearance: 45.8 mL/min (by C-G formula based on Cr of 0.77).    Recent Labs  08/07/15 1809 08/07/15 2355 08/08/15 0550  GLUCAP 148* 129* 116*     Insulin Requirements: Sensitive SSI: 3 units given.  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  She was admitted 5/4 with intractable N/V and found to have large hiatal hernia, gastic outlet obstruction or gastric volvulus.  Significant events:  5/7 Start TPN, cadiothoracic surgery recommends transfer to [facility] for surgical repair of hiatal hernia d/t significant adhesions between stomach and pericardium. 5/9 Per surgery, planning to transition to cyclic TPN and oral medications, then discharge to home for outpatient f/u thoracic surgeon consultation.  Today:    Glucose: at goal, CBGs <150.  No history of diabetes  Electrolytes: Na low, others  WNL including K, Mag, Phos, CorrCa.  Renal: SCr stable  LFTs: Low or WNL  TGs - 92 (5/8)  Prealbumin - 15.6 (5/4), 10.7 (5/8)  NUTRITIONAL GOALS                                                                                             RD recs: 70-90 g/day protein, Kcal 1300-1550 /day Clinimix E 5/15 at a goal rate of 39ml/hr + 20% fat emulsion at 67ml/hr to provide: 72 g/day protein, 1502 Kcal/day.  PLAN                                                                                                                 At 1800 today:  Change to cyclic TPN tonight, start with 18 hour cycle  Infuse total of 1440 mL over 18 hrs : 50 mL/hr x 1 hr, then 84 mL/hr x 16 hrs, then 50 mL/hr x 1 hr.  Cycle 20% fat emulsion, infuse at 46ml/hr x 18 hours.  Plan to cycle further over 12 hours as tolerated.  TPN to contain standard multivitamins and trace elements.  Reduce IVF to 70ml/hr.  Continue sensitive SSI and CBGs q6h  TPN lab panels on Mondays & Thursdays.  F/u daily.  Gretta Arab PharmD, BCPS Pager [phone] 08/08/2015 8:41 AM

## 2015-08-08 NOTE — Care Management Note (Signed)
[facility] Management Note  Patient Details  Name: [name] MRN: [id number] Date of Birth: 10-07-1934  Subjective/Objective:  Patient chose [facility] for HHRN-iv infusion TPN-Pam rep iv liason notified-provided her w/dtr primary contac info:Teresa Robertson c#[phone].Pleae put in [facility] order-TPN per [facility] protocal.                  Action/Plan:d/c home w/HHC/TPN.   Expected Discharge Date:   (UNKNOWN)               Expected Discharge Plan:  [place]  In-House Referral:     Discharge planning Services  CM Consult  Post Acute Care Choice:    Choice offered to:  Patient  DME Arranged:    DME Agency:     HH Arranged:  RN [place] Agency:     Status of Service:  Completed, signed off  Medicare Important Message Given:  Yes Date Medicare IM Given:    Medicare IM give by:    Date Additional Medicare IM Given:    Additional Medicare Important Message give by:     If discussed at [place] of Stay Meetings, dates discussed:    Additional Comments:  Dessa Phi, RN 08/08/2015, 1:58 PM

## 2015-08-08 NOTE — Progress Notes (Signed)
PROGRESS NOTE  [name] [id number] DOB: 1935/03/10 DOA: 08/03/2015 PCP: Terald Sleeper, PA-C  Brief Summary:  [name] is a 81 y.o. female  H/o PAF , not on anticoagulation due to gi bleed/anemia, actively followed by cardiology, h/o larger hiatal hernia, h/o perforated diverticulitis s/p resection and colostomy, h/o right colon mass under evaluation for surgical resection who developed intractable n/v for the last three weeks, she was seen by gi and general surgery but her symptoms became worse, she presented to [facility] long ED. In the ED , she had multiple episodes of vomiting, no reported hematemesis, denies ab pain, reported colostomy output wnl. No fever, general surgery seen patient and recommended admit to hospitalist service, general surgery will consult.  CT ab showed gastric volvulus, gastric outlet obstruction , she underwent EGD by GI who think the volvulus was reduced, general surgery consulted for possible surgical intervention for large hiatal hernia, thoracic surgery also consulted, it is determined that patient will benefit from tertiary care center referral and the hiatal hernia repaired through thoracoabdominal approach at a medical center due to adhesions from prior hiatal hernia.  Upper gi down on 5/8 showed barium passed through, DR Rosenbower recommended transition all med to oral, start diet, hopefully, patient be able to discharge home on nightly TPN, so far patient tolerating clears, possible able to discharge home on 5/10 if continue do well and all meds transition to oral.   Otherwise , need to be transferred to tertiary care center.   HPI/Recap of past 24 hours:  Some nausea, no vomiting , tolerated clears, denies pain,     Assessment/Plan: Active Problems:   Vomiting   N&V (nausea and vomiting)   Uncontrollable vomiting   Gastric outlet obstruction   Irreducible hiatal hernia   TIA (transient ischemic attack)   Protein-calorie malnutrition,  severe  Intractable N/V/large hiatal hernia/gastic outlet obstruction/gastric volvulus:  with normal colostomy function, lft wnl, no ab pain, ct ab concerning for gastric outlet obstruction, gastric volvulus,  EGD on 5/5 confirmed erosive esophagitis and gastric volvulus. General surgery consulted. Not able to tolerate clears, continue ivf. General surgery ordered picc line and tpn. Cardiothoracic surgery consulted , feel patient will benefit from tertiary care center. Upper GI imaging on 5/8 contrast material passes through,   I have discussed with general surgery Dr Zella Richer who recommended start trial of clears see if patient able to tolerate po and transition all meds to oral, if patient tolerate po , she may be able to be discharged home with cyclic tpn at night and have outpatient follow up with tertiary center surgical specialist.   Hypokalemia/hypomagnesemia: replace k,mag   Afib/RVR with h/o PAF, not on anticoagulation in the past due to h/o GI bleed anemia. initially in NSR, then in afib/RVR on cardizem drip, converted to NSR, she was kept on cardizem drip till 59 due to not able to tolerate oral intake,   TIA? Patient developed sign of TIA with tongue numbness and slurry speech on 5/6 , heparin drip started, patient and family aware the risk of bleed, they choose to continue anticoagulation and transfuse prn unless life threatening bleed, patient states " i do not want risk having stroke"  cardiology consulted, input appreciated. She is kept on heparin drip till 5/9, now started on eliquis 2.5mg  bid  HTN: uncontrolled due to not able to keep oral meds down, on cardizem drip and prn hydralazine till 5/9, restarted oral bp meds, continue titrate  H/o hiatal hernia,  acid reflux, on pepcid  H/o colonic mass, general surgery following  UTI? Culture no significant growth, empirically start rocephin x4 days and stopped.    DVT prophylaxis: was on heparin drip, now on  eliquis  Consultants: general surgery, GI, cardiothoracic surgery, cardiology  Code Status: full   Family Communication: Patient   Disposition Plan: home on 5/10 if able to tolerate po  Procedures:  EGD on 5/5  picc line /tpn  Antibiotics:  Rocephin from 5/6 to 5/9   Objective: BP 153/75 mmHg  Pulse 73  Temp(Src) 98.6 F (37 C) (Oral)  Resp 18  Ht 5\' 2"  (1.575 m)  Wt 52.436 kg (115 lb 9.6 oz)  BMI 21.14 kg/m2  SpO2 96%  Intake/Output Summary (Last 24 hours) at 08/08/15 0845 Last data filed at 08/07/15 2300  Gross per 24 hour  Intake 16140.93 ml  Output      0 ml  Net 16140.93 ml   Filed Weights   08/06/15 0500 08/07/15 0553 08/08/15 0659  Weight: 52 kg (114 lb 10.2 oz) 51.7 kg (113 lb 15.7 oz) 52.436 kg (115 lb 9.6 oz)    Exam:   General:  NAD  Cardiovascular: RRR  Respiratory: CTABL  Abdomen: Soft/ND/NT, positive BS, + colostomy  Musculoskeletal: No Edema  Neuro: aaox3  Data Reviewed: Basic Metabolic Panel:  Recent Labs Lab 08/04/15 0443 08/05/15 0513 08/06/15 0109 08/07/15 0354 08/08/15 0500  NA 138 137 128* 135 133*  K 3.8 3.7 4.1 3.8 4.3  CL 103 104 101 103 103  CO2 22 19* 16* 25 24  GLUCOSE 83 82 81 114* 124*  BUN 12 8 7 9 16   CREATININE 1.01* 0.85 0.84 0.78 0.77  CALCIUM 9.1 9.1 8.1* 8.6* 8.9  MG 1.7 1.5* 1.9 1.8 2.0  PHOS  --   --  2.2* 3.6 3.3   Liver Function Tests:  Recent Labs Lab 08/03/15 1359 08/04/15 0443 08/07/15 0354  AST 17 12* 11*  ALT 8* 6* 7*  ALKPHOS 64 53 40  BILITOT 1.1 0.8 0.4  PROT 6.8 6.0* 5.2*  ALBUMIN 4.2 3.5 3.1*    Recent Labs Lab 08/03/15 1359  LIPASE 18   No results for input(s): AMMONIA in the last 168 hours. CBC:  Recent Labs Lab 08/03/15 1358 08/04/15 0443 08/05/15 0513 08/06/15 0109 08/07/15 0354 08/08/15 0500  WBC 10.1 8.2 7.5 6.5 5.7 7.3  NEUTROABS 6.6  --   --   --  3.7  --   HGB 13.1 11.5* 12.1 10.1* 10.3* 10.2*  HCT 40.9 35.4* 37.0 31.5* 31.0* 30.8*  MCV 79.7  78.1 79.1 77.4* 78.7 78.8  PLT 266 219 195 187 190 202   Cardiac Enzymes:   No results for input(s): CKTOTAL, CKMB, CKMBINDEX, TROPONINI in the last 168 hours. BNP (last 3 results)  Recent Labs  06/16/15 2233 06/19/15 0555  BNP 1250.0* 217.0*    ProBNP (last 3 results) No results for input(s): PROBNP in the last 8760 hours.  CBG:  Recent Labs Lab 08/07/15 0810 08/07/15 1211 08/07/15 1809 08/07/15 2355 08/08/15 0550  GLUCAP 118* 129* 148* 129* 116*    Recent Results (from the past 240 hour(s))  Urine culture     Status: Abnormal   Collection Time: 08/03/15  2:03 PM  Result Value Ref Range Status   Specimen Description URINE, CLEAN CATCH  Final   Special Requests NONE  Final   Culture MULTIPLE SPECIES PRESENT, SUGGEST RECOLLECTION (A)  Final   Report Status 08/05/2015 FINAL  Final     Studies: Dg Ugi W/high Density W/kub  08/07/2015  CLINICAL DATA:  Hiatal hernia. EXAM: UPPER GI SERIES WITH KUB TECHNIQUE: After obtaining a scout radiograph a routine upper GI series was performed using thin barium. FLUOROSCOPY TIME:  Radiation Exposure Index (as provided by the fluoroscopic device): If the device does not provide the exposure index: Fluoroscopy Time (in minutes and seconds): 1 minutes and 36 seconds. Number of Acquired Images: COMPARISON:  CT scan 08/03/2015. FINDINGS: Preprocedure KUB shows surgical clips in the region of the esophagogastric junction and in the lower right chest/mediastinum. Bowel gas pattern is nonspecific. Patient was given thin barium by mouth which passes readily through a somewhat tortuous appearing esophagus. No gross esophageal diverticulum or mass lesion. Contrast then flows into a large hiatal hernia seen just to the right of midline in the lower chest. Contrast material is able to pass through the diaphragmatic hiatus into the antral region of the stomach which appears be below the hiatus. Roughly 2/3 to 3/4 of the stomach is above the hiatus. Contrast  does pass through the pylorus into the proximal small bowel which is nondilated. Patient was given a 13 mm barium tablet which lodged briefly at the level of the distal esophagus but then passed into the stomach with additional swallows of water. IMPRESSION: Large hiatal hernia without complete mechanical obstruction. Contrast material passes into the distal stomach and proximal small bowel. Electronically Signed   By: Misty Stanley M.D.   On: 08/07/2015 10:17    Scheduled Meds: . cefTRIAXone (ROCEPHIN)  IV  1 g Intravenous Q24H  . famotidine (PEPCID) IV  20 mg Intravenous Q12H  . insulin aspart  0-9 Units Subcutaneous Q6H  . isosorbide mononitrate  30 mg Oral Daily  . sodium chloride flush  3 mL Intravenous Q12H    Continuous Infusions: . 0.9 % NaCl with KCl 20 mEq / L 15 mL/hr at 08/07/15 1741  . diltiazem (CARDIZEM) infusion 10 mg/hr (08/08/15 0727)  . Marland KitchenTPN (CLINIMIX-E) Adult 60 mL/hr at 08/07/15 1732   And  . fat emulsion 240 mL (08/07/15 1732)  . Marland KitchenTPN (CLINIMIX-E) Adult     And  . fat emulsion    . heparin 850 Units/hr (08/08/15 0726)     Time spent: 23mins  Valinda Fedie MD, PhD  [facility] Pager [phone]. If 7PM-7AM, please contact night-coverage at [email or web address], password [facility] 08/08/2015, 8:45 AM  LOS: 5 days

## 2015-08-09 DIAGNOSIS — I4891 Unspecified atrial fibrillation: Secondary | ICD-10-CM | POA: Diagnosis not present

## 2015-08-09 LAB — BASIC METABOLIC PANEL
Anion gap: 8 (ref 5–15)
BUN: 23 mg/dL — AB (ref 6–20)
CHLORIDE: 104 mmol/L (ref 101–111)
CO2: 25 mmol/L (ref 22–32)
CREATININE: 0.89 mg/dL (ref 0.44–1.00)
Calcium: 9.2 mg/dL (ref 8.9–10.3)
GFR calc Af Amer: >60 mL/min (ref >60)
GFR calc non Af Amer: >60 mL/min (ref >60)
GLUCOSE: 113 mg/dL — AB (ref 65–99)
Potassium: 4.4 mmol/L (ref 3.5–5.1)
SODIUM: 137 mmol/L (ref 135–145)

## 2015-08-09 LAB — MAGNESIUM: MAGNESIUM: 1.9 mg/dL (ref 1.7–2.4)

## 2015-08-09 LAB — CBC
HCT: 29.4 % — ABNORMAL LOW (ref 36.0–46.0)
HEMOGLOBIN: 9.6 g/dL — AB (ref 12.0–15.0)
MCH: 25.5 pg — AB (ref 26.0–34.0)
MCHC: 32.7 g/dL (ref 30.0–36.0)
MCV: 78.2 fL (ref 78.0–100.0)
Platelets: 186 10*3/uL (ref 150–400)
RBC: 3.76 MIL/uL — ABNORMAL LOW (ref 3.87–5.11)
RDW: 16.7 % — ABNORMAL HIGH (ref 11.5–15.5)
WBC: 5.3 10*3/uL (ref 4.0–10.5)

## 2015-08-09 LAB — GLUCOSE, CAPILLARY
GLUCOSE-CAPILLARY: 108 mg/dL — AB (ref 65–99)
Glucose-Capillary: 109 mg/dL — ABNORMAL HIGH (ref 65–99)
Glucose-Capillary: 116 mg/dL — ABNORMAL HIGH (ref 65–99)

## 2015-08-09 LAB — OCCULT BLOOD X 1 CARD TO LAB, STOOL: Fecal Occult Bld: NEGATIVE

## 2015-08-09 LAB — PHOSPHORUS: Phosphorus: 4.5 mg/dL (ref 2.5–4.6)

## 2015-08-09 MED ORDER — APIXABAN 2.5 MG PO TABS: 2.5000 mg | ORAL_TABLET | Freq: Two times a day (BID) | ORAL | Status: DC

## 2015-08-09 MED ORDER — HEPARIN SOD (PORK) LOCK FLUSH 100 UNIT/ML IV SOLN
250.0000 [IU] | INTRAVENOUS | Status: AC | PRN
  Administered 2015-08-09: 250 [IU]

## 2015-08-09 MED ORDER — ONDANSETRON 4 MG PO TBDP: 4.0000 mg | ORAL_TABLET | Freq: Three times a day (TID) | ORAL | Status: DC | PRN

## 2015-08-09 MED ORDER — FAT EMULSION 20 % IV EMUL: 240.0000 mL | INTRAVENOUS | Status: DC

## 2015-08-09 MED ORDER — DILTIAZEM HCL ER COATED BEADS 240 MG PO CP24: 240.0000 mg | ORAL_CAPSULE | Freq: Every day | ORAL | Status: DC

## 2015-08-09 NOTE — Care Management Note (Signed)
[place] Management Note  Patient Details  Name: [name] MRN: [id number] Date of Birth: 29-Aug-1934  Subjective/Objective: [facility] iv [place]-Pam teaching patient's primary CG dtr. D/c home w/HHC-iv TPN.No further d/c needs.                   Action/Plan:d/c home w/HHC TPN.   Expected Discharge Date:   (UNKNOWN)               Expected Discharge Plan:  [place]  In-House Referral:     Discharge planning Services  CM Consult  Post Acute Care Choice:    Choice offered to:  Patient  DME Arranged:    DME Agency:     HH Arranged:  RN [place] Agency:  [place]  Status of Service:  Completed, signed off  Medicare Important Message Given:  Yes Date Medicare IM Given:    Medicare IM give by:    Date Additional Medicare IM Given:    Additional Medicare Important Message give by:     If discussed at [place] of Stay Meetings, dates discussed:    Additional Comments:  Dessa Phi, RN 08/09/2015, 11:32 AM

## 2015-08-09 NOTE — Progress Notes (Signed)
NUTRITION NOTE  Pt seen for full follow-up 5/9 with associated note at 1148. Pt to d/c with home TPN. Pt currently receiving cyclic TPN and diet was advanced from FLD to Soft today at 0830 with 50% intake of breakfast this AM. [place] representative in talking with pt at this time; will see pt after rounds to provide pt with diet education (soft diet, low fiber diet).   Order for TPN: cyclic Clinimix E 123456 (1440 mL over 18 hours) with 20% lipids (13 mL/hr for 18 hours) which is providing 72 grams of protein, 1022 kcal + 408 kcal from lipids (1430 kcal total).   When able to talk with pt today, will provide her with handouts from the [employer] outlining low fiber diet, appropriate and inappropriate foods, and high protein food options.   Estimated Nutritional Needs:  Kcal: 1300-1550 (25-30 kcal/kg) Protein: 70-80 grams (1.35-1.55 grams/kg) Fluid: >/= 1.5 L/day  If pt does not d/c by 5/12, will see her that date.   Jarome Matin, RD, LDN Inpatient Clinical Dietitian Pager # [phone] After hours/weekend pager # [phone]

## 2015-08-09 NOTE — Progress Notes (Signed)
5 Days Post-Op  Subjective: Tolerating full liquids with no vomiting. Objective: Vital signs in last 24 hours: Temp:  [97.6 F (36.4 C)-98.4 F (36.9 C)] 97.6 F (36.4 C) (05/10 0507) Pulse Rate:  [66-81] 78 (05/10 0507) Resp:  [18-20] 18 (05/10 0507) BP: (158-170)/(60-86) 163/86 mmHg (05/10 0507) SpO2:  [99 %-100 %] 99 % (05/10 0507) Weight:  [52.254 kg (115 lb 3.2 oz)] 52.254 kg (115 lb 3.2 oz) (05/10 0507) Last BM Date: 08/08/15  Intake/Output from previous day: 05/09 0701 - 05/10 0700 In: 2697.5 [P.O.:480; I.V.:135.4; IV Piggyback:100; TPN:1982.2] Out: -  Intake/Output this shift:    PE: General- Appears less tired this AM Abd-soft, not tender   Lab Results:   Recent Labs  08/08/15 0500 08/09/15 0330  WBC 7.3 5.3  HGB 10.2* 9.6*  HCT 30.8* 29.4*  PLT 202 186   BMET  Recent Labs  08/08/15 0500 08/09/15 0330  NA 133* 137  K 4.3 4.4  CL 103 104  CO2 24 25  GLUCOSE 124* 113*  BUN 16 23*  CREATININE 0.77 0.89  CALCIUM 8.9 9.2   PT/INR No results for input(s): LABPROT, INR in the last 72 hours. Comprehensive Metabolic Panel:    Component Value Date/Time   NA 137 08/09/2015 0330   NA 133* 08/08/2015 0500   K 4.4 08/09/2015 0330   K 4.3 08/08/2015 0500   CL 104 08/09/2015 0330   CL 103 08/08/2015 0500   CO2 25 08/09/2015 0330   CO2 24 08/08/2015 0500   BUN 23* 08/09/2015 0330   BUN 16 08/08/2015 0500   CREATININE 0.89 08/09/2015 0330   CREATININE 0.77 08/08/2015 0500   GLUCOSE 113* 08/09/2015 0330   GLUCOSE 124* 08/08/2015 0500   CALCIUM 9.2 08/09/2015 0330   CALCIUM 8.9 08/08/2015 0500   AST 11* 08/07/2015 0354   AST 12* 08/04/2015 0443   ALT 7* 08/07/2015 0354   ALT 6* 08/04/2015 0443   ALKPHOS 40 08/07/2015 0354   ALKPHOS 53 08/04/2015 0443   BILITOT 0.4 08/07/2015 0354   BILITOT 0.8 08/04/2015 0443   PROT 5.2* 08/07/2015 0354   PROT 6.0* 08/04/2015 0443   ALBUMIN 3.1* 08/07/2015 0354   ALBUMIN 3.5 08/04/2015 0443      Studies/Results: Dg Ugi W/high Density W/kub  08/07/2015  CLINICAL DATA:  Hiatal hernia. EXAM: UPPER GI SERIES WITH KUB TECHNIQUE: After obtaining a scout radiograph a routine upper GI series was performed using thin barium. FLUOROSCOPY TIME:  Radiation Exposure Index (as provided by the fluoroscopic device): If the device does not provide the exposure index: Fluoroscopy Time (in minutes and seconds): 1 minutes and 36 seconds. Number of Acquired Images: COMPARISON:  CT scan 08/03/2015. FINDINGS: Preprocedure KUB shows surgical clips in the region of the esophagogastric junction and in the lower right chest/mediastinum. Bowel gas pattern is nonspecific. Patient was given thin barium by mouth which passes readily through a somewhat tortuous appearing esophagus. No gross esophageal diverticulum or mass lesion. Contrast then flows into a large hiatal hernia seen just to the right of midline in the lower chest. Contrast material is able to pass through the diaphragmatic hiatus into the antral region of the stomach which appears be below the hiatus. Roughly 2/3 to 3/4 of the stomach is above the hiatus. Contrast does pass through the pylorus into the proximal small bowel which is nondilated. Patient was given a 13 mm barium tablet which lodged briefly at the level of the distal esophagus but then passed into the stomach  with additional swallows of water. IMPRESSION: Large hiatal hernia without complete mechanical obstruction. Contrast material passes into the distal stomach and proximal small bowel. Electronically Signed   By: Misty Stanley M.D.   On: 08/07/2015 10:17    Anti-infectives: Anti-infectives    Start     Dose/Rate Route Frequency Ordered Stop   08/05/15 0900  cefTRIAXone (ROCEPHIN) 1 g in dextrose 5 % 50 mL IVPB  Status:  Discontinued     1 g 100 mL/hr over 30 Minutes Intravenous Every 24 hours 08/05/15 0804 08/08/15 1349      Assessment Active Problems:   N&V (nausea and vomiting) due  to gastric volvulus from recurrent hiatal hernia: her recurrent hiatal hernia was not reducible/repairable via a abdominal approach 11 months ago-episode of n/v this AM;  Dr. Prescott Gum feels this would need to be repaired via a thoracoabdominal approach at a medical center.  No gastric outlet obstruction currently and tolerating some full liquids.   Anemia felt to be due to intermittent bleeding from ascending colon tubulovillous adenoma (no high-grade dysplasia or malignancy)-hemoglobin down to 9.6 Symptomatic cholelithiasis-has been asx recently  Poorly controlled HTN  Afib with TIA type symptoms 08/05/15  PC malnutrition-TPN started and plan to cycle at night; Prealbumin 10.7 on 5/8    LOS: 6 days   Plan: Try soft diet.  Convert all meds to oral, Zofran ODT for nausea, cycle TPN at night.  Once above is done satisfactorily, could discharge to home and see a  surgeon at one of the medical centers for consultation.  Diya Gervasi J 08/09/2015

## 2015-08-09 NOTE — Progress Notes (Signed)
Subjective: See below  Objective: Vital signs in last 24 hours: Temp:  [97.6 F (36.4 C)-98.4 F (36.9 C)] 97.6 F (36.4 C) (05/10 0507) Pulse Rate:  [66-81] 78 (05/10 0507) Resp:  [18-20] 18 (05/10 0507) BP: (158-170)/(60-86) 163/86 mmHg (05/10 0507) SpO2:  [99 %-100 %] 99 % (05/10 0507) Weight:  [115 lb 3.2 oz (52.254 kg)] 115 lb 3.2 oz (52.254 kg) (05/10 0507) Weight change: -6.4 oz (-0.181 kg) Last BM Date: 08/08/15 Intake/Output from previous day: +3525 05/09 0701 - 05/10 0700 In: 2697.5 [P.O.:480; I.V.:135.4; IV Piggyback:100; TPN:1982.2] Out: -  Intake/Output this shift:    PE:  Sitting up in bed with family in room Tele:SR brief run PAF this AM    Lab Results:  Recent Labs  08/08/15 0500 08/09/15 0330  WBC 7.3 5.3  HGB 10.2* 9.6*  HCT 30.8* 29.4*  PLT 202 186   BMET  Recent Labs  08/08/15 0500 08/09/15 0330  NA 133* 137  K 4.3 4.4  CL 103 104  CO2 24 25  GLUCOSE 124* 113*  BUN 16 23*  CREATININE 0.77 0.89  CALCIUM 8.9 9.2   No results for input(s): TROPONINI in the last 72 hours.  Invalid input(s): CK, MB  Lab Results  Component Value Date   TRIG 92 08/07/2015   No results found for: HGBA1C   Lab Results  Component Value Date   TSH 3.744 08/05/2015    Hepatic Function Panel  Recent Labs  08/07/15 0354  PROT 5.2*  ALBUMIN 3.1*  AST 11*  ALT 7*  ALKPHOS 40  BILITOT 0.4   No results for input(s): CHOL in the last 72 hours. No results for input(s): PROTIME in the last 72 hours.     Studies/Results: Dg Ugi W/high Density W/kub  08/07/2015  CLINICAL DATA:  Hiatal hernia. EXAM: UPPER GI SERIES WITH KUB TECHNIQUE: After obtaining a scout radiograph a routine upper GI series was performed using thin barium. FLUOROSCOPY TIME:  Radiation Exposure Index (as provided by the fluoroscopic device): If the device does not provide the exposure index: Fluoroscopy Time (in minutes and seconds): 1 minutes and 36 seconds. Number  of Acquired Images: COMPARISON:  CT scan 08/03/2015. FINDINGS: Preprocedure KUB shows surgical clips in the region of the esophagogastric junction and in the lower right chest/mediastinum. Bowel gas pattern is nonspecific. Patient was given thin barium by mouth which passes readily through a somewhat tortuous appearing esophagus. No gross esophageal diverticulum or mass lesion. Contrast then flows into a large hiatal hernia seen just to the right of midline in the lower chest. Contrast material is able to pass through the diaphragmatic hiatus into the antral region of the stomach which appears be below the hiatus. Roughly 2/3 to 3/4 of the stomach is above the hiatus. Contrast does pass through the pylorus into the proximal small bowel which is nondilated. Patient was given a 13 mm barium tablet which lodged briefly at the level of the distal esophagus but then passed into the stomach with additional swallows of water. IMPRESSION: Large hiatal hernia without complete mechanical obstruction. Contrast material passes into the distal stomach and proximal small bowel. Electronically Signed   By: Misty Stanley M.D.   On: 08/07/2015 10:17    Medications: I have reviewed the patient's current medications. Scheduled Meds: . apixaban  2.5 mg Oral BID  . diltiazem  240 mg Oral Daily  . famotidine (PEPCID) IV  20 mg Intravenous Q12H  .  insulin aspart  0-9 Units Subcutaneous Q6H  . isosorbide mononitrate  30 mg Oral Daily  . losartan  50 mg Oral Daily  . sodium chloride flush  3 mL Intravenous Q12H   Continuous Infusions: . Marland KitchenTPN (CLINIMIX-E) Adult 84 mL/hr at 08/08/15 1851   And  . fat emulsion 240 mL (08/08/15 1800)   PRN Meds:.ALPRAZolam, hydrALAZINE, nitroGLYCERIN, ondansetron (ZOFRAN) IV, ondansetron, prochlorperazine, sodium chloride flush, zolpidem  Assessment/Plan: Active Problems:   Vomiting   N&V (nausea and vomiting)   Uncontrollable vomiting   Gastric outlet obstruction   Irreducible hiatal  hernia   TIA (transient ischemic attack)   Protein-calorie malnutrition, severe   Slurred speech   Hiatal hernia   Atrial fibrillation with RVR (HCC)  A fib maintaining SR on po dilt  Brief episode of a fib this AM otherwise SR  Plan for d/c home then to medical center.   LOS: 6 days   Time spent with pt. :15 minutes. [facility]  Nurse Practitioner Certified Pager [phone] or after 5pm and on weekends call [phone] 08/09/2015, 8:15 AM  History and all data above reviewed.  Patient examined.  I agree with the findings as above.  No complaints.  Ready to go home.    The patient exam reveals COR:RRR  ,  Lungs: Clear  ,  Abd: Positive bowel sounds, no rebound no guarding, Ext No edema  .  All available labs, radiology testing, previous records reviewed. Agree with documented assessment and plan. Atrial fib:  Maintaining NSR.  She and I had a long discussion about anticoagulation.  She is anxious not to have an embolic stroke and accepts the risk of anticoagulation.    [name] [name]  12:52 PM  08/09/2015

## 2015-08-09 NOTE — Discharge Summary (Signed)
Physician Discharge Summary  [name] [id number] DOB: [birth year] DOA: 08/03/2015  PCP: Terald Sleeper, PA-C  Admit date: 08/03/2015 Discharge date: 08/09/2015  Time spent: 35 minutes  Recommendations for Outpatient Follow-up:  1. Please follow up with PCP in one week.  2. Please follow up with surgeon as recommended.    Discharge Diagnoses:  Active Problems:   Vomiting   N&V (nausea and vomiting)   Uncontrollable vomiting   Gastric outlet obstruction   Irreducible hiatal hernia   TIA (transient ischemic attack)   Protein-calorie malnutrition, severe   Slurred speech   Hiatal hernia   Atrial fibrillation with RVR ([place])   Discharge Condition: improved  Diet recommendation: soft diet.   Filed Weights   08/07/15 0553 08/08/15 0659 08/09/15 0507  Weight: 51.7 kg (113 lb 15.7 oz) 52.436 kg (115 lb 9.6 oz) 52.254 kg (115 lb 3.2 oz)    History of present illness:  81 year old lady with H/o PAF , not on anticoagulation due to gi bleed/anemia, actively followed by cardiology, h/o larger hiatal hernia, h/o perforated diverticulitis s/p resection and colostomy, h/o right colon mass under evaluation for surgical resection who developed intractable n/v for the last three weeks, she was seen by gi and general surgery but her symptoms became worse, she presented to [facility] long ED. In the ED , she had multiple episodes of vomiting, no reported hematemesis, denies ab pain, reported colostomy output wnl. No fever, general surgery seen patient and recommended admit to hospitalist service, general surgery will consult.  CT ab showed gastric volvulus, gastric outlet obstruction , she underwent EGD by GI who think the volvulus was reduced, general surgery consulted for possible surgical intervention for large hiatal hernia, thoracic surgery also consulted, it is determined that patient will benefit from tertiary care center referral and the hiatal hernia repaired through thoracoabdominal approach at  a medical center due to adhesions from prior hiatal hernia.  Upper gi down on 5/8 showed barium passed through, DR Rosenbower recommended transition all med to oral, start diet, hopefully, patient be able to discharge home on nightly TPN, she was discharged home with oral meds,a ble to tolerate soft diet and recommended to follow up with surgeon as outpatient.   Hospital Course:  Intractable N/V/large hiatal hernia/gastic outlet obstruction/gastric volvulus:  with normal colostomy function, lft wnl, no ab pain, ct ab concerning for gastric outlet obstruction, gastric volvulus,  EGD on 5/5 confirmed erosive esophagitis and gastric volvulus. General surgery consulted. Not able to tolerate clears, continue ivf. General surgery ordered picc line and tpn. Cardiothoracic surgery consulted , feel patient will benefit from tertiary care center. Upper GI imaging on 5/8 contrast material passes through,  I have discussed with general surgery Dr Zella Richer who recommended start trial of clears see if patient able to tolerate po and transition all meds to oral, if patient tolerate po , she may be able to be discharged home with cyclic tpn at night and have outpatient follow up with tertiary center surgical specialist. Discussed with the patient, who will follow recommendations.   Hypokalemia/hypomagnesemia: replaced  Afib/RVR with h/o PAF, not on anticoagulation in the past due to h/o GI bleed anemia. initially in NSR, then in afib/RVR on cardizem drip, converted to NSR, discharged on oral anticoagulation and Cardizem.   TIA? Patient developed sign of TIA with tongue numbness and slurry speech on 5/6 , heparin drip started, patient and family aware the risk of bleed, they choose to continue anticoagulation and transfuse  prn unless life threatening bleed, patient states " i do not want risk having stroke"  cardiology consulted, input appreciated. She is kept on heparin drip till 5/9, now started on eliquis  2.5mg  bid. She will be discharged to on the same, recommended to follow up with neurology as outpatient.    HTN: better controlled. Today, repeat BP is 150/85 mmhg.  H/o hiatal hernia, acid reflux, on pepcid  H/o colonic mass, outpatient follow up with gen surgery.   UTI? Culture no significant growth, empirically start rocephin x4 days and stopped.     Procedures:  EGD on 5/5  picc line /tpn  Consultations: Surgery.  Discharge Exam: Filed Vitals:   08/09/15 0507 08/09/15 0951  BP: 163/86 155/60  Pulse: 78   Temp: 97.6 F (36.4 C)   Resp: 18     General: alert comfortable.  Cardiovascular: s1s2 Respiratory: ctab  Discharge Instructions   Discharge Instructions    Diet - low sodium heart healthy    Complete by:  As directed      Discharge instructions    Complete by:  As directed   PLEASE follow up with the surgeon as recommended.  Please continue the TPN as per pharmacy and [facility]  Please follow up with cardiology as recommended.          Current Discharge Medication List    START taking these medications   Details  apixaban (ELIQUIS) 2.5 MG TABS tablet Take 1 tablet (2.5 mg total) by mouth 2 (two) times daily. Qty: 60 tablet, Refills: 0    diltiazem (CARDIZEM CD) 240 MG 24 hr capsule Take 1 capsule (240 mg total) by mouth daily. Qty: 30 capsule, Refills: 0    fat emulsion 20 % infusion Inject 240 mLs into the vein continuous. Qty: 250 mL      CONTINUE these medications which have CHANGED   Details  ondansetron (ZOFRAN-ODT) 4 MG disintegrating tablet Take 1 tablet (4 mg total) by mouth every 8 (eight) hours as needed for nausea or vomiting. Qty: 30 tablet, Refills: 0      CONTINUE these medications which have NOT CHANGED   Details  albuterol (PROVENTIL HFA;VENTOLIN HFA) 108 (90 Base) MCG/ACT inhaler Inhale 2 puffs into the lungs every 6 (six) hours as needed for wheezing or shortness of breath.     ALPRAZolam (XANAX) 0.25 MG tablet Take 1 tablet  (0.25 mg total) by mouth 2 (two) times daily as needed for anxiety. Qty: 60 tablet, Refills: 0    citalopram (CELEXA) 40 MG tablet Take 20 mg by mouth at bedtime.     esomeprazole (NEXIUM) 40 MG capsule Take 40 mg by mouth daily before breakfast.     ferrous sulfate 325 (65 FE) MG EC tablet Take 325 mg by mouth 3 (three) times daily.     furosemide (LASIX) 20 MG tablet Take 1 tablet (20 mg total) by mouth daily. Qty: 30 tablet, Refills: 0    isosorbide mononitrate (IMDUR) 30 MG 24 hr tablet Take 30 mg by mouth daily.    losartan (COZAAR) 100 MG tablet Take 1 tablet (100 mg total) by mouth daily. Qty: 30 tablet, Refills: 6    metoCLOPramide (REGLAN) 5 MG tablet Take 1 tablet (5 mg total) by mouth 3 (three) times daily before meals. Qty: 90 tablet, Refills: 2    Multiple Vitamins-Minerals (PRESERVISION AREDS) TABS Take 1 tablet by mouth 2 (two) times daily.     nitroGLYCERIN (NITROSTAT) 0.4 MG SL tablet Place 0.4 mg under  the tongue every 5 (five) minutes as needed for chest pain. Reported on 04/26/2015    polyethylene glycol powder (GLYCOLAX/MIRALAX) powder MIX 17 GRAMS IN AT LEAST 8OZ OF WATER/JUICE AND DRINK TWICE DAILY Qty: 1054 g, Refills: 0    potassium chloride (K-DUR) 10 MEQ tablet Take 1 tablet (10 mEq total) by mouth daily. Qty: 30 tablet, Refills: 0    Propylene Glycol (SYSTANE BALANCE OP) Place 1-2 drops into both eyes daily as needed (dry eyes). Reported on 04/26/2015    rosuvastatin (CRESTOR) 10 MG tablet Take 10 mg by mouth at bedtime.     traZODone (DESYREL) 100 MG tablet Take 100 mg by mouth at bedtime.     Vitamin D, Ergocalciferol, (DRISDOL) 50000 UNITS CAPS Take 50,000 Units by mouth every Tuesday.       STOP taking these medications     hydrALAZINE (APRESOLINE) 25 MG tablet      metoprolol succinate (TOPROL-XL) 25 MG 24 hr tablet      isosorbide-hydrALAZINE (BIDIL) 20-37.5 MG tablet      metoprolol tartrate (LOPRESSOR) 25 MG tablet        Allergies   Allergen Reactions  . Sulfamethoxazole Rash   Follow-up Information    Follow up with [facility].   Why:  HHRN-TPN   Contact information:   [address] [place] [address] [phone]       Follow up with Terald Sleeper, PA-C. Schedule an appointment as soon as possible for a visit in 1 week.   Specialty:  General Practice   Contact information:   [place] [address] [place] [address] [phone]        The results of significant diagnostics from this hospitalization (including imaging, microbiology, ancillary and laboratory) are listed below for reference.    Significant Diagnostic Studies: Dg Chest 2 View  08/03/2015  CLINICAL DATA:  Vomiting EXAM: CHEST  2 VIEW COMPARISON:  06/16/2015 FINDINGS: Cardiac shadow it is stable. Right basilar infiltrate with associated effusion is noted. The overall appearance is stable from the prior exam. No left pleural effusion is seen. The left lung is clear. Old healing right rib fractures are noted. IMPRESSION: Right basilar infiltrate with associated effusion. Healing right rib fractures. Electronically Signed   By: Inez Catalina M.D.   On: 08/03/2015 16:59   Ct Head Wo Contrast  08/05/2015  CLINICAL DATA:  Slurred speech and tongue numbness lasting 5 minutes, almost resolved, history atrial fibrillation, high risk of TIA and stroke, was off anti coagulation due to history of GI bleed, history CHF, pulmonary hypertension, hypertension, former smoker EXAM: CT HEAD WITHOUT CONTRAST TECHNIQUE: Contiguous axial images were obtained from the base of the skull through the vertex without intravenous contrast. COMPARISON:  None FINDINGS: Generalized atrophy. Normal ventricular morphology. No midline shift or mass effect. Mild small vessel chronic ischemic changes of deep cerebral white matter. No intracranial hemorrhage, mass lesion, or evidence of acute infarction. No extra-axial fluid collections. Atherosclerotic  calcifications at carotid siphons and vertebral arteries. Bones and sinuses unremarkable. IMPRESSION: Atrophy with small vessel chronic ischemic changes of deep cerebral white matter. No acute intracranial abnormalities. Electronically Signed   By: Lavonia Dana M.D.   On: 08/05/2015 16:53   Ct Abdomen Pelvis W Contrast  08/03/2015  CLINICAL DATA:  History of colon cancer now with persistent nausea and vomiting. EXAM: CT ABDOMEN AND PELVIS WITH CONTRAST TECHNIQUE: Multidetector CT imaging of the abdomen and pelvis was performed using the standard protocol following  bolus administration of intravenous contrast. CONTRAST:  52mL ISOVUE-300 IOPAMIDOL (ISOVUE-300) INJECTION 61% COMPARISON:  Right upper quadrant abdominal ultrasound - 06/29/2015; CT abdomen pelvis -05/18/2015 ; 05/01/2015 FINDINGS: Lower chest: Limited visualization of lower thorax demonstrates resolution of previously noted small to moderate size left-sided effusion. Interval decrease in persistent small to moderate-sized right-sided effusion and associated partial atelectasis/ collapse of the right lower lobe. No focal airspace opacities. Normal heart size. Coronary artery calcifications. No pericardial effusion. Hepatobiliary: Normal hepatic contour. No discrete hepatic lesions. Normal appearance of the gallbladder given degree distention. No radiopaque gallstones. No intra extrahepatic bili duct dilatation. No ascites. Pancreas: Appears normal, however multiple surgical clips are noted about the cranial aspect of the pancreatic tail. No pancreatic stranding. Spleen: Appears normal. Adrenals/Urinary Tract: There is symmetric enhancement and excretion of the bilateral kidneys. Re- demonstrated bilateral renal cysts with dominant partially exophytic cyst arising from the superior pole of the left kidney measuring 3.6 cm in diameter and dominant right-sided renal cyst measuring approximately 2.2 cm. Additional right-sided sub cm hypoattenuating lesions  are too small to adequately characterize though favored to represent additional renal cysts. There is a punctate (approximately 0.4 cm) nonobstructing stone within the inferior pole the right kidney. No evidence of right-sided nephrolithiasis in this postcontrast examination. No urinary obstruction or perinephric stranding. Stomach/Bowel: Re- demonstrated large sliding hiatal hernia, however there is now marked fluid distention of the stomach. No definitive evidence of volvulus though gastric outlet obstruction could have a similar appearance. No definitive gastric wall thickening. Post left hemicolectomy with colostomy within the left mid hemi abdomen. No evidence of enteric obstruction. No pneumoperitoneum, pneumatosis or portal venous gas. Interval removal of left trans gluteal approach percutaneous drainage catheter without evidence of recurrent pelvic fluid collection. Vascular/Lymphatic: Large amount of eccentric mixed calcified and noncalcified atherosclerotic plaque within a normal caliber abdominal aorta. The major branch vessels of the abdominal aorta appear patent on this non CTA examination. No bulky retroperitoneal, mesenteric, pelvic or inguinal lymphadenopathy. Reproductive: Pelvic organs are normal for age. No discrete adnexal lesion. Small amount of free fluid in the pelvic cul-de-sac. Other: Well-healed midline incision. Musculoskeletal: No acute or aggressive osseous abnormalities. Stigmata of DISH within the thoracic spine. IMPRESSION: 1. Re- demonstrated large hiatal hernia - while there is no definitive evidence of gastric volvulus, there is marked fluid distention of the stomach and as such, gastric outlet obstruction is not excluded on the basis of this examination. Clinical correlation is advised. Further evaluation with endoscopy could be performed as clinically indicated. 2. Post left hemi colectomy and end colostomy without evidence of enteric obstruction. 3. Interval removal of left  trans gluteal approach percutaneous drainage catheter without evidence of new definable/drainable abdominal or pelvic fluid collection. 4. Unchanged solitary punctate (approximately 4 mm) nonobstructing right-sided renal stone. 5. Interval resolution of left-sided pleural effusion. Residual small to moderate size right-sided effusion has decreased in size in interval though there is persistent partial atelectasis/collapse of the right lower lobe. 6. Atherosclerosis including coronary artery calcifications. Electronically Signed   By: Sandi Mariscal M.D.   On: 08/03/2015 18:35   Dg Ugi W/high Density W/kub  08/07/2015  CLINICAL DATA:  Hiatal hernia. EXAM: UPPER GI SERIES WITH KUB TECHNIQUE: After obtaining a scout radiograph a routine upper GI series was performed using thin barium. FLUOROSCOPY TIME:  Radiation Exposure Index (as provided by the fluoroscopic device): If the device does not provide the exposure index: Fluoroscopy Time (in minutes and seconds): 1 minutes and 36 seconds. Number  of Acquired Images: COMPARISON:  CT scan 08/03/2015. FINDINGS: Preprocedure KUB shows surgical clips in the region of the esophagogastric junction and in the lower right chest/mediastinum. Bowel gas pattern is nonspecific. Patient was given thin barium by mouth which passes readily through a somewhat tortuous appearing esophagus. No gross esophageal diverticulum or mass lesion. Contrast then flows into a large hiatal hernia seen just to the right of midline in the lower chest. Contrast material is able to pass through the diaphragmatic hiatus into the antral region of the stomach which appears be below the hiatus. Roughly 2/3 to 3/4 of the stomach is above the hiatus. Contrast does pass through the pylorus into the proximal small bowel which is nondilated. Patient was given a 13 mm barium tablet which lodged briefly at the level of the distal esophagus but then passed into the stomach with additional swallows of water. IMPRESSION:  Large hiatal hernia without complete mechanical obstruction. Contrast material passes into the distal stomach and proximal small bowel. Electronically Signed   By: Misty Stanley M.D.   On: 08/07/2015 10:17    Microbiology: Recent Results (from the past 240 hour(s))  Urine culture     Status: Abnormal   Collection Time: 08/03/15  2:03 PM  Result Value Ref Range Status   Specimen Description URINE, CLEAN CATCH  Final   Special Requests NONE  Final   Culture MULTIPLE SPECIES PRESENT, SUGGEST RECOLLECTION (A)  Final   Report Status 08/05/2015 FINAL  Final     Labs: Basic Metabolic Panel:  Recent Labs Lab 08/05/15 0513 08/06/15 0109 08/07/15 0354 08/08/15 0500 08/09/15 0330  NA 137 128* 135 133* 137  K 3.7 4.1 3.8 4.3 4.4  CL 104 101 103 103 104  CO2 19* 16* 25 24 25   GLUCOSE 82 81 114* 124* 113*  BUN 8 7 9 16  23*  CREATININE 0.85 0.84 0.78 0.77 0.89  CALCIUM 9.1 8.1* 8.6* 8.9 9.2  MG 1.5* 1.9 1.8 2.0 1.9  PHOS  --  2.2* 3.6 3.3 4.5   Liver Function Tests:  Recent Labs Lab 08/03/15 1359 08/04/15 0443 08/07/15 0354  AST 17 12* 11*  ALT 8* 6* 7*  ALKPHOS 64 53 40  BILITOT 1.1 0.8 0.4  PROT 6.8 6.0* 5.2*  ALBUMIN 4.2 3.5 3.1*    Recent Labs Lab 08/03/15 1359  LIPASE 18   No results for input(s): AMMONIA in the last 168 hours. CBC:  Recent Labs Lab 08/03/15 1358  08/05/15 0513 08/06/15 0109 08/07/15 0354 08/08/15 0500 08/09/15 0330  WBC 10.1  < > 7.5 6.5 5.7 7.3 5.3  NEUTROABS 6.6  --   --   --  3.7  --   --   HGB 13.1  < > 12.1 10.1* 10.3* 10.2* 9.6*  HCT 40.9  < > 37.0 31.5* 31.0* 30.8* 29.4*  MCV 79.7  < > 79.1 77.4* 78.7 78.8 78.2  PLT 266  < > 195 187 190 202 186  < > = values in this interval not displayed. Cardiac Enzymes: No results for input(s): CKTOTAL, CKMB, CKMBINDEX, TROPONINI in the last 168 hours. BNP: BNP (last 3 results)  Recent Labs  06/16/15 2233 06/19/15 0555  BNP 1250.0* 217.0*    ProBNP (last 3 results) No results for  input(s): PROBNP in the last 8760 hours.  CBG:  Recent Labs Lab 08/08/15 0550 08/08/15 1123 08/08/15 1743 08/09/15 0006 08/09/15 0618  GLUCAP 116* 117* 127* 116* 109*       Signed:  Psalm Arman  MD.  [facility] 08/09/2015, 12:28 PM

## 2015-08-23 ENCOUNTER — Encounter: Payer: Self-pay | Admitting: General Surgery

## 2015-08-23 NOTE — Progress Notes (Signed)
I spoke with her today.  She saw [name] at [facility] on Friday.  The plan is to do an upper endoscopy and colonoscopy and then schedule a laparoscopic type procedure.  She is back on Eliquis for her PAF and her hemoglobin has drifted down from 10.2 weeks ago to 7.9 recently. This could be due to some slow bleeding from her esophagitis or the ascending colon polyp.  The Eliquis may need to be stopped because of this.  She is scheduled to see her Cardiologist, Dr. Bronson Ing on 08/25/15 and I will leave it up to him to make the decision of stopping the Eliquis.  She realizes the pros and cons of doing this. If she starts eating better, we may be able to stop the TPN.  Her recent Albumin was 2.9.

## 2015-08-25 ENCOUNTER — Ambulatory Visit (INDEPENDENT_AMBULATORY_CARE_PROVIDER_SITE_OTHER): Payer: Medicare Other | Admitting: Cardiovascular Disease

## 2015-08-25 ENCOUNTER — Encounter: Payer: Self-pay | Admitting: Cardiovascular Disease

## 2015-08-25 VITALS — BP 95/53 | HR 56 | Ht 62.0 in | Wt 120.0 lb

## 2015-08-25 DIAGNOSIS — Z9861 Coronary angioplasty status: Secondary | ICD-10-CM

## 2015-08-25 DIAGNOSIS — I10 Essential (primary) hypertension: Secondary | ICD-10-CM | POA: Diagnosis not present

## 2015-08-25 DIAGNOSIS — Z87898 Personal history of other specified conditions: Secondary | ICD-10-CM

## 2015-08-25 DIAGNOSIS — K449 Diaphragmatic hernia without obstruction or gangrene: Secondary | ICD-10-CM

## 2015-08-25 DIAGNOSIS — Z01818 Encounter for other preprocedural examination: Secondary | ICD-10-CM

## 2015-08-25 DIAGNOSIS — I480 Paroxysmal atrial fibrillation: Secondary | ICD-10-CM

## 2015-08-25 DIAGNOSIS — Z9889 Other specified postprocedural states: Secondary | ICD-10-CM

## 2015-08-25 DIAGNOSIS — I2510 Atherosclerotic heart disease of native coronary artery without angina pectoris: Secondary | ICD-10-CM

## 2015-08-25 DIAGNOSIS — Z9289 Personal history of other medical treatment: Secondary | ICD-10-CM

## 2015-08-25 DIAGNOSIS — I5032 Chronic diastolic (congestive) heart failure: Secondary | ICD-10-CM

## 2015-08-25 DIAGNOSIS — I272 Other secondary pulmonary hypertension: Secondary | ICD-10-CM

## 2015-08-25 DIAGNOSIS — Z5181 Encounter for therapeutic drug level monitoring: Secondary | ICD-10-CM

## 2015-08-25 DIAGNOSIS — Z7901 Long term (current) use of anticoagulants: Secondary | ICD-10-CM

## 2015-08-25 DIAGNOSIS — R001 Bradycardia, unspecified: Secondary | ICD-10-CM

## 2015-08-25 MED ORDER — HYDRALAZINE HCL 25 MG PO TABS
25.0000 mg | ORAL_TABLET | Freq: Two times a day (BID) | ORAL | Status: DC
Start: 2015-08-25 — End: 2016-02-12

## 2015-08-25 NOTE — Progress Notes (Signed)
Patient ID: [name], female   DOB: 03/17/1935, 82 y.o.   MRN: [id number]      SUBJECTIVE: The patient presents for post-hospitalization follow-up. She had intractable nausea and vomiting with gastric outlet obstruction. EGD on 5/5 confirmed erosive esophagitis and gastric volvulus. She was evaluated by general surgery. She was also evaluated by CT surgery. She had episodic rapid atrial fibrillation. She was discharged on anticoagulation which had to be discontinued yesterday as she became more anemic. She may have had a TIA while hospitalized. She is receiving total parenteral nutrition.  She was evaluated by Dr. Rayann Heman who did not feel she is currently a candidate for the Watchman device, although this could be considered in the future.  Echocardiogram on 06/17/15 demonstrated vigorous left ventricular systolic function, EF [id number], grade 3 diastolic dysfunction, elevated filling pressures, and severe pulmonary hypertension, 70 mmHg.  She underwent a normal nuclear stress test on 05/08/14.  Hemoglobin 9.6 on 5/10.  Evaluated by Dr. Reece Levy in general surgery at [facility] for large recurrent hiatal hernia. She also has a colonic mass measuring 4-5 cm. There is a planned endoscopic mass resection as well as EGD.  She currently denies chest pain, shortness of breath, palpitations, and leg swelling. She is gradually regaining her strength.   Review of Systems: As per "subjective", otherwise negative.  Allergies  Allergen Reactions  . Sulfamethoxazole Rash    Current Outpatient Prescriptions  Medication Sig Dispense Refill  . albuterol (PROVENTIL HFA;VENTOLIN HFA) 108 (90 Base) MCG/ACT inhaler Inhale 2 puffs into the lungs every 6 (six) hours as needed for wheezing or shortness of breath.     . ALPRAZolam (XANAX) 0.25 MG tablet Take 1 tablet (0.25 mg total) by mouth 2 (two) times daily as needed for anxiety. 60 tablet 0  . citalopram (CELEXA) 40 MG tablet Take 20 mg by mouth at bedtime.      Marland Kitchen diltiazem (CARDIZEM CD) 240 MG 24 hr capsule Take 1 capsule (240 mg total) by mouth daily. 30 capsule 0  . esomeprazole (NEXIUM) 40 MG capsule Take 40 mg by mouth daily before breakfast.     . fat emulsion 20 % infusion Inject 240 mLs into the vein continuous. 250 mL   . ferrous sulfate 325 (65 FE) MG EC tablet Take 325 mg by mouth 3 (three) times daily.     . furosemide (LASIX) 20 MG tablet Take 1 tablet (20 mg total) by mouth daily. 30 tablet 0  . hydrALAZINE (APRESOLINE) 50 MG tablet Take 50 mg by mouth 3 (three) times daily.    . isosorbide mononitrate (IMDUR) 30 MG 24 hr tablet Take 30 mg by mouth daily.    Marland Kitchen loratadine (CLARITIN) 10 MG tablet Take 10 mg by mouth daily.    Marland Kitchen losartan (COZAAR) 100 MG tablet Take 1 tablet (100 mg total) by mouth daily. 30 tablet 6  . metoCLOPramide (REGLAN) 5 MG tablet Take 1 tablet (5 mg total) by mouth 3 (three) times daily before meals. 90 tablet 2  . metoprolol succinate (TOPROL-XL) 25 MG 24 hr tablet Take 1 tablet by mouth 2 (two) times daily.    . Multiple Vitamins-Minerals (PRESERVISION AREDS) TABS Take 1 tablet by mouth 2 (two) times daily.     . nitroGLYCERIN (NITROSTAT) 0.4 MG SL tablet Place 0.4 mg under the tongue every 5 (five) minutes as needed for chest pain. Reported on 04/26/2015    . ondansetron (ZOFRAN-ODT) 4 MG disintegrating tablet Take 1 tablet (4 mg total)  by mouth every 8 (eight) hours as needed for nausea or vomiting. 30 tablet 0  . polyethylene glycol powder (GLYCOLAX/MIRALAX) powder MIX 17 GRAMS IN AT LEAST 8OZ OF WATER/JUICE AND DRINK TWICE DAILY 1054 g 0  . potassium chloride (K-DUR) 10 MEQ tablet Take 1 tablet (10 mEq total) by mouth daily. 30 tablet 0  . Propylene Glycol (SYSTANE BALANCE OP) Place 1-2 drops into both eyes daily as needed (dry eyes). Reported on 04/26/2015    . rosuvastatin (CRESTOR) 10 MG tablet Take 10 mg by mouth at bedtime.     . traZODone (DESYREL) 100 MG tablet Take 100 mg by mouth at bedtime.     .  Vitamin D, Ergocalciferol, (DRISDOL) 50000 UNITS CAPS Take 50,000 Units by mouth every Tuesday.     Marland Kitchen apixaban (ELIQUIS) 2.5 MG TABS tablet Take 1 tablet (2.5 mg total) by mouth 2 (two) times daily. (Patient not taking: Reported on 08/25/2015) 60 tablet 0   No current facility-administered medications for this visit.    Past Medical History  Diagnosis Date  . Depressive disorder, not elsewhere classified   . Anxiety state, unspecified   . Pure hypercholesterolemia   . Esophageal reflux   . Infection of esophagostomy ([place])   . Congestive heart failure, unspecified     diastolic heart failure  . Pulmonary hypertension (HCC)     PA systolic pressure [id number] mmHg by echocardiogram, PA pressure 33/10 by cardiac catheterization PA saturation 62% thermodilution cardiac index 2.0 thick cardiac index 2.4  . Tricuspid regurgitation     moderate tricuspid regurgitation by echocardiogram, prior use of anorexic agents  . Melanoma ([place])     removal on back   . Coronary artery disease     a. Moderate to severe coronary disease involving the left anterior descending artery and right coronary artery.  Sequential stenosis in the LAD is significant.  Right coronary artery is moderate to severely likely nonischemic. Questionable small LVOT obstruction.  No Brockenbrough maneuver was performed.  Catheterization January 2013; b. 05/2014 MV no isch/infarct, EF 60%.  . Hypertension   . H/O hiatal hernia   . Gastritis   . [name]'s esophagus   . IBS (irritable bowel syndrome)   . Macular degeneration   . Diverticulitis   . Perforated bowel ([place])   . Anemia   . Atrial fibrillation with RVR ([place])   . Perforated sigmoid colon ([place])   . Cellulitis of extremity   . Pelvic abscess in female   . PONV (postoperative nausea and vomiting)     Past Surgical History  Procedure Laterality Date  . Nissen fundoplication  [id number]  . Rotator cuff repair  ~ 2001    right  . Coronary angioplasty with stent placement   05/08/11    "1"  . Tonsillectomy  ~ 1944  . Shoulder arthroscopy  ~ 2004; 2005    left; "joint's wore out"  . Cataract extraction w/ intraocular lens  implant, bilateral  ~ 2002  . Tubal ligation  1972  . Percutaneous coronary stent intervention (pci-s) N/A 05/08/2011    Procedure: PERCUTANEOUS CORONARY STENT INTERVENTION (PCI-S);  Surgeon: Wellington Hampshire, MD;  Location: [facility] CATH LAB;  Service: Cardiovascular;  Laterality: N/A;  . Esophageal manometry N/A 06/06/2014    Procedure: ESOPHAGEAL MANOMETRY (EM);  Surgeon: Jerene Bears, MD;  Location: [facility] ENDOSCOPY;  Service: Gastroenterology;  Laterality: N/A;  . Laparoscopic nissen fundoplication N/A [id number]    Procedure: LAPAROSCOPIC  LYSIS OF ADHESIONS, SEGMENTAL GASTRECTOMY, PARTIAL REDUCTION  OF HERNIA;  Surgeon: [name], MD;  Location: [facility] ORS;  Service: General;  Laterality: N/A;  . Hernia repair    . Colectomy with colostomy creation/hartmann procedure N/A 03/05/2015    Procedure: PARTIAL COLECTOMY WITH COLOSTOMY CREATION/HARTMANN PROCEDURE;  Surgeon: Aviva Signs, MD;  Location: [facility] ORS;  Service: General;  Laterality: N/A;  . Cardiac catheterization Left 03/05/2015    Procedure: CENTRAL LINE INSERTION;  Surgeon: Aviva Signs, MD;  Location: [facility] ORS;  Service: General;  Laterality: Left;  . Esophagogastroduodenoscopy (egd) with propofol N/A 08/04/2015    Procedure: ESOPHAGOGASTRODUODENOSCOPY (EGD) WITH PROPOFOL;  Surgeon: Mauri Pole, MD;  Location: [facility] ENDOSCOPY;  Service: Endoscopy;  Laterality: N/A;    Social History   Social History  . Marital Status: Divorced    Spouse Name: N/A  . Number of Children: 3  . Years of Education: N/A   Occupational History  . Retired    Social History Main Topics  . Smoking status: Former Smoker -- 1.00 packs/day for 20 years    Types: Cigarettes    Start date: 10/12/1958    Quit date: 04/26/1988  . Smokeless tobacco: Never Used  . Alcohol Use: No  . Drug Use: No  . Sexual Activity: No     Other Topics Concern  . Not on file   Social History Narrative   Divorced.  Lives alone.  Ambulates with a walker.      Filed Vitals:   08/25/15 1350  BP: 95/53  Pulse: 56  Height: 5\' 2"  (1.575 m)  Weight: 120 lb (54.432 kg)    PHYSICAL EXAM General: NAD HEENT: Normal. Neck: No JVD, no thyromegaly. Lungs: Clear to auscultation bilaterally with normal respiratory effort. CV: Nondisplaced PMI.  Regular rate and rhythm, normal S1/S2, no S3/S4, no murmur. No pretibial or periankle edema.    Abdomen: Soft, no distention.  Neurologic: Alert and oriented.  Psych: Normal affect. Skin: Normal. Musculoskeletal: No gross deformities.    ECG: Most recent ECG reviewed.      ASSESSMENT AND PLAN: 1. CAD s/p LAD stent: Stable ischemic heart disease. Normal nuclear MPI study 05/08/14. Not on ASA due to reasons mentioned above (high bleeding risk, anemic). Would continue metoprolol, Crestor, and Imdur.  2. Essential HTN: Relatively hypotensive. Likely due to hypoalbuminemia to some degree and overall nutrition status. Will reduce hydralazine to 25 mg tid.  3. Symptomatic bradycardia/Paroxysmal atrial fibrillation: Stable at present on metoprolol. No longer on anticoagulation for reasons mentioned above (high bleeding risk, anemic, falls risk). Will refer to Dr. Rayann Heman for Watchman device consideration given elevated thromboembolic risk after GI procedures completed. See his note for his recommendations.  4. Chronic diastolic heart failure: Symptomatically stable on current dose of Lasix.  5. Preoperative risk stratification: No noninvasive testing needed at this time. Likely at an intermediate risk for major adverse cardiac events in the perioperative period given concomitant history of CAD, chronic diastolic heart failure, and atrial fibrillation.  Dispo: f/u 3 months.  Time spent: 40 minutes, of which greater than 50% was spent reviewing symptoms, relevant blood tests and  studies, and discussing management plan with the patient.   Kate Sable, M.D., F.A.C.C.

## 2015-08-25 NOTE — Patient Instructions (Addendum)
   Decrease Hydralazine to 25mg  twice a day  - new sent to [facility] today. Continue all other medications.   Follow up in  3 months.

## 2015-08-28 ENCOUNTER — Encounter (HOSPITAL_COMMUNITY): Payer: Self-pay

## 2015-08-28 ENCOUNTER — Emergency Department (HOSPITAL_COMMUNITY): Payer: Medicare Other

## 2015-08-28 ENCOUNTER — Observation Stay (HOSPITAL_COMMUNITY)
Admission: EM | Admit: 2015-08-28 | Discharge: 2015-08-29 | Disposition: A | Discharge status: Home / Self Care | Payer: Medicare Other | Attending: Internal Medicine | Admitting: Internal Medicine

## 2015-08-28 DIAGNOSIS — Z79899 Other long term (current) drug therapy: Secondary | ICD-10-CM | POA: Insufficient documentation

## 2015-08-28 DIAGNOSIS — I480 Paroxysmal atrial fibrillation: Secondary | ICD-10-CM | POA: Diagnosis present

## 2015-08-28 DIAGNOSIS — I509 Heart failure, unspecified: Secondary | ICD-10-CM | POA: Insufficient documentation

## 2015-08-28 DIAGNOSIS — I2510 Atherosclerotic heart disease of native coronary artery without angina pectoris: Secondary | ICD-10-CM | POA: Diagnosis not present

## 2015-08-28 DIAGNOSIS — I4891 Unspecified atrial fibrillation: Secondary | ICD-10-CM | POA: Insufficient documentation

## 2015-08-28 DIAGNOSIS — I10 Essential (primary) hypertension: Secondary | ICD-10-CM | POA: Diagnosis present

## 2015-08-28 DIAGNOSIS — Z7982 Long term (current) use of aspirin: Secondary | ICD-10-CM | POA: Insufficient documentation

## 2015-08-28 DIAGNOSIS — E860 Dehydration: Secondary | ICD-10-CM | POA: Diagnosis not present

## 2015-08-28 DIAGNOSIS — D649 Anemia, unspecified: Principal | ICD-10-CM | POA: Insufficient documentation

## 2015-08-28 DIAGNOSIS — K6389 Other specified diseases of intestine: Secondary | ICD-10-CM

## 2015-08-28 DIAGNOSIS — R7989 Other specified abnormal findings of blood chemistry: Secondary | ICD-10-CM | POA: Diagnosis present

## 2015-08-28 DIAGNOSIS — F329 Major depressive disorder, single episode, unspecified: Secondary | ICD-10-CM | POA: Diagnosis not present

## 2015-08-28 DIAGNOSIS — K922 Gastrointestinal hemorrhage, unspecified: Secondary | ICD-10-CM | POA: Insufficient documentation

## 2015-08-28 DIAGNOSIS — Z87891 Personal history of nicotine dependence: Secondary | ICD-10-CM | POA: Diagnosis not present

## 2015-08-28 DIAGNOSIS — I110 Hypertensive heart disease with heart failure: Secondary | ICD-10-CM | POA: Diagnosis not present

## 2015-08-28 DIAGNOSIS — K2210 Ulcer of esophagus without bleeding: Secondary | ICD-10-CM | POA: Diagnosis present

## 2015-08-28 DIAGNOSIS — D500 Iron deficiency anemia secondary to blood loss (chronic): Secondary | ICD-10-CM | POA: Diagnosis not present

## 2015-08-28 DIAGNOSIS — K3189 Other diseases of stomach and duodenum: Secondary | ICD-10-CM | POA: Diagnosis present

## 2015-08-28 LAB — CBC WITH DIFFERENTIAL/PLATELET
BAND NEUTROPHILS: 0 %
BASOS ABS: 0 10*3/uL (ref 0.0–0.1)
BLASTS: 0 %
Basophils Relative: 0 %
EOS ABS: 2.7 10*3/uL — AB (ref 0.0–0.7)
Eosinophils Relative: 31 %
HEMATOCRIT: 22 % — AB (ref 36.0–46.0)
Hemoglobin: 7.2 g/dL — ABNORMAL LOW (ref 12.0–15.0)
Lymphocytes Relative: 21 %
Lymphs Abs: 1.8 10*3/uL (ref 0.7–4.0)
MCH: 26.1 pg (ref 26.0–34.0)
MCHC: 32.7 g/dL (ref 30.0–36.0)
MCV: 79.7 fL (ref 78.0–100.0)
METAMYELOCYTES PCT: 0 %
Monocytes Absolute: 0.1 10*3/uL (ref 0.1–1.0)
Monocytes Relative: 1 %
Myelocytes: 0 %
NEUTROS ABS: 4.1 10*3/uL (ref 1.7–7.7)
Neutrophils Relative %: 47 %
Other: 0 %
PLATELETS: 149 10*3/uL — AB (ref 150–400)
PROMYELOCYTES ABS: 0 %
RBC: 2.76 MIL/uL — ABNORMAL LOW (ref 3.87–5.11)
RDW: 16.7 % — ABNORMAL HIGH (ref 11.5–15.5)
WBC: 8.7 10*3/uL (ref 4.0–10.5)
nRBC: 0 /100 WBC

## 2015-08-28 LAB — BASIC METABOLIC PANEL
Anion gap: 7 (ref 5–15)
BUN: 36 mg/dL — AB (ref 6–20)
CALCIUM: 8.5 mg/dL — AB (ref 8.9–10.3)
CO2: 24 mmol/L (ref 22–32)
CREATININE: 1.06 mg/dL — AB (ref 0.44–1.00)
Chloride: 100 mmol/L — ABNORMAL LOW (ref 101–111)
GFR calc Af Amer: 57 mL/min — ABNORMAL LOW (ref >60)
GFR, EST NON AFRICAN AMERICAN: 49 mL/min — AB (ref >60)
GLUCOSE: 109 mg/dL — AB (ref 65–99)
Potassium: 4.5 mmol/L (ref 3.5–5.1)
Sodium: 131 mmol/L — ABNORMAL LOW (ref 135–145)

## 2015-08-28 LAB — PREPARE RBC (CROSSMATCH)

## 2015-08-28 LAB — POC OCCULT BLOOD, ED: Fecal Occult Bld: POSITIVE — AB

## 2015-08-28 MED ORDER — FAT EMULSION 20 % IV EMUL: 240.0000 mL | INTRAVENOUS | Status: DC

## 2015-08-28 MED ORDER — DILTIAZEM HCL ER COATED BEADS 240 MG PO CP24: 240.0000 mg | ORAL_CAPSULE | Freq: Every day | ORAL | Status: DC

## 2015-08-28 MED ORDER — ALBUTEROL SULFATE (2.5 MG/3ML) 0.083% IN NEBU
3.0000 mL | INHALATION_SOLUTION | Freq: Four times a day (QID) | RESPIRATORY_TRACT | Status: DC | PRN
Start: 2015-08-28 — End: 2015-08-28

## 2015-08-28 MED ORDER — POTASSIUM CHLORIDE CRYS ER 10 MEQ PO TBCR
10.0000 meq | EXTENDED_RELEASE_TABLET | Freq: Every day | ORAL | Status: DC
  Administered 2015-08-29: 10 meq via ORAL
  Filled 2015-08-28 (×3): qty 1

## 2015-08-28 MED ORDER — SODIUM CHLORIDE 0.9% FLUSH
3.0000 mL | Freq: Two times a day (BID) | INTRAVENOUS | Status: DC
  Administered 2015-08-28: 3 mL via INTRAVENOUS

## 2015-08-28 MED ORDER — SODIUM CHLORIDE 0.9 % IV SOLN: INTRAVENOUS | Status: DC

## 2015-08-28 MED ORDER — METOPROLOL SUCCINATE ER 25 MG PO TB24
25.0000 mg | ORAL_TABLET | Freq: Two times a day (BID) | ORAL | Status: DC
  Administered 2015-08-28: 25 mg via ORAL
  Filled 2015-08-28: qty 1

## 2015-08-28 MED ORDER — HYDRALAZINE HCL 25 MG PO TABS
25.0000 mg | ORAL_TABLET | Freq: Two times a day (BID) | ORAL | Status: DC
  Administered 2015-08-28 – 2015-08-29 (×2): 25 mg via ORAL
  Filled 2015-08-28 (×2): qty 1

## 2015-08-28 MED ORDER — ZOLPIDEM TARTRATE 5 MG PO TABS
5.0000 mg | ORAL_TABLET | Freq: Every evening | ORAL | Status: DC | PRN
  Administered 2015-08-28: 5 mg via ORAL
  Filled 2015-08-28: qty 1

## 2015-08-28 MED ORDER — LORATADINE 10 MG PO TABS
10.0000 mg | ORAL_TABLET | Freq: Every day | ORAL | Status: DC
  Administered 2015-08-29: 10 mg via ORAL
  Filled 2015-08-28: qty 1

## 2015-08-28 MED ORDER — SODIUM CHLORIDE 0.9 % IV SOLN
8.0000 mg/h | INTRAVENOUS | Status: DC
  Filled 2015-08-28 (×3): qty 80

## 2015-08-28 MED ORDER — NITROGLYCERIN 0.4 MG SL SUBL
0.4000 mg | SUBLINGUAL_TABLET | SUBLINGUAL | Status: DC | PRN
Start: 2015-08-28 — End: 2015-08-29

## 2015-08-28 MED ORDER — OCUVITE-LUTEIN PO CAPS
1.0000 | ORAL_CAPSULE | Freq: Two times a day (BID) | ORAL | Status: DC
  Administered 2015-08-28 – 2015-08-29 (×2): 1 via ORAL
  Filled 2015-08-28 (×2): qty 1

## 2015-08-28 MED ORDER — POLYVINYL ALCOHOL 1.4 % OP SOLN: 1.0000 [drp] | Freq: Every day | OPHTHALMIC | Status: DC | PRN

## 2015-08-28 MED ORDER — FERROUS SULFATE 325 (65 FE) MG PO TABS
325.0000 mg | ORAL_TABLET | Freq: Three times a day (TID) | ORAL | Status: DC
  Administered 2015-08-28 – 2015-08-29 (×2): 325 mg via ORAL
  Filled 2015-08-28 (×2): qty 1

## 2015-08-28 MED ORDER — CITALOPRAM HYDROBROMIDE 20 MG PO TABS
20.0000 mg | ORAL_TABLET | Freq: Every day | ORAL | Status: DC
  Administered 2015-08-28: 20 mg via ORAL
  Filled 2015-08-28: qty 1

## 2015-08-28 MED ORDER — SODIUM CHLORIDE 0.9 % IV SOLN
80.0000 mg | Freq: Once | INTRAVENOUS | Status: DC
  Filled 2015-08-28: qty 80

## 2015-08-28 MED ORDER — ONDANSETRON HCL 4 MG PO TABS
4.0000 mg | ORAL_TABLET | Freq: Four times a day (QID) | ORAL | Status: DC | PRN
Start: 2015-08-28 — End: 2015-08-29

## 2015-08-28 MED ORDER — ISOSORBIDE MONONITRATE ER 30 MG PO TB24
30.0000 mg | ORAL_TABLET | Freq: Every day | ORAL | Status: DC
  Administered 2015-08-29: 30 mg via ORAL
  Filled 2015-08-28: qty 1

## 2015-08-28 MED ORDER — LOSARTAN POTASSIUM 50 MG PO TABS
100.0000 mg | ORAL_TABLET | Freq: Every day | ORAL | Status: DC
  Administered 2015-08-29: 100 mg via ORAL
  Filled 2015-08-28: qty 2

## 2015-08-28 MED ORDER — ALBUTEROL SULFATE (2.5 MG/3ML) 0.083% IN NEBU: 2.5000 mg | INHALATION_SOLUTION | RESPIRATORY_TRACT | Status: DC | PRN

## 2015-08-28 MED ORDER — SODIUM CHLORIDE 0.9 % IV SOLN
10.0000 mL/h | Freq: Once | INTRAVENOUS | Status: AC
  Administered 2015-08-28: 10 mL/h via INTRAVENOUS

## 2015-08-28 MED ORDER — TRAZODONE HCL 50 MG PO TABS
100.0000 mg | ORAL_TABLET | Freq: Every day | ORAL | Status: DC
  Administered 2015-08-28: 100 mg via ORAL
  Filled 2015-08-28: qty 2

## 2015-08-28 MED ORDER — ROSUVASTATIN CALCIUM 10 MG PO TABS
10.0000 mg | ORAL_TABLET | Freq: Every day | ORAL | Status: DC
  Administered 2015-08-28: 10 mg via ORAL
  Filled 2015-08-28: qty 1

## 2015-08-28 MED ORDER — VITAMIN D (ERGOCALCIFEROL) 1.25 MG (50000 UNIT) PO CAPS
50000.0000 [IU] | ORAL_CAPSULE | ORAL | Status: DC
Start: 2015-08-29 — End: 2015-08-29
  Administered 2015-08-29: 50000 [IU] via ORAL
  Filled 2015-08-28: qty 1

## 2015-08-28 MED ORDER — PANTOPRAZOLE SODIUM 40 MG IV SOLR
40.0000 mg | Freq: Two times a day (BID) | INTRAVENOUS | Status: DC
  Administered 2015-08-28 – 2015-08-29 (×2): 40 mg via INTRAVENOUS
  Filled 2015-08-28 (×2): qty 40

## 2015-08-28 MED ORDER — ONDANSETRON HCL 4 MG/2ML IJ SOLN: 4.0000 mg | Freq: Four times a day (QID) | INTRAMUSCULAR | Status: DC | PRN

## 2015-08-28 NOTE — H&P (Signed)
History and Physical    [name] [id number] DOB: 1934/10/28 DOA: 08/28/2015  Referring MD/NP/PA: Dr. Thurnell Garbe PCP: Terald Sleeper, PA-C  Patient coming from: home  Chief Complaint: Blood counts low  HPI: [name] is a 81 y.o. female with medical history significant of PAF, diastolic CHF, hiatal hernia, perforated diverticulitis s/p resection with colostomy, right colon mass under evaluation for surgical resection, gastric volvulus with outlet obstruction, currently on TPN; who presents with complaints of  low blood counts. Patient was just recently hospitalized for recurrent nausea and vomiting on 5/4 EGD on 5/5 confirmed erosive esophagitis and gastric volvulus. She notes that the volvulus was performed with the EGD temporarily. During the hospitalization evaluated by general surgery and CT surgery. She had episodic rapid atrial fibrillation. She was discharged on anticoagulation of Eliquis but was discontinued approximately 4-5 days ago after her weekly blood work showed that her hemoglobin dropped from 9.6 down to 7.9. Patient notes that her home health nurse drew blood work this morning  like usual because of her being on TPN. Her home health nurse called her back stating that her hemoglobin was 7 and tocome in to the emergency department for further evaluation.  patient had been doing well and eating more to the point in which she is gaining some of her weight back. She admits that she does not drink enough fluids, but she has good appetite and has been eating a lot better. Currently on a soft diet.  ED Course: Upon admission patient was evaluated and seen to be afebrile, pulse 51-56, respirations 15, blood pressure as low as 128/58, O2 saturations maintain on room air. Blood work revealed a hemoglobin 7.2, sodium 131, potassium 4.5, chloride 100, BUN 36, creatinine 1.06. Chest x-Erinne revealed a stable right sided pleural effusion. Patient was typed and screened and ordered 1 unit of red blood  cells to be transfused.   Review of Systems: As per HPI otherwise 10 point review of systems negative.   Past Medical History  Diagnosis Date  . Depressive disorder, not elsewhere classified   . Anxiety state, unspecified   . Pure hypercholesterolemia   . Esophageal reflux   . Infection of esophagostomy ([place])   . Congestive heart failure, unspecified     diastolic heart failure  . Pulmonary hypertension (HCC)     PA systolic pressure [id number] mmHg by echocardiogram, PA pressure 33/10 by cardiac catheterization PA saturation 62% thermodilution cardiac index 2.0 thick cardiac index 2.4  . Tricuspid regurgitation     moderate tricuspid regurgitation by echocardiogram, prior use of anorexic agents  . Melanoma ([place])     removal on back   . Coronary artery disease     a. Moderate to severe coronary disease involving the left anterior descending artery and right coronary artery.  Sequential stenosis in the LAD is significant.  Right coronary artery is moderate to severely likely nonischemic. Questionable small LVOT obstruction.  No Brockenbrough maneuver was performed.  Catheterization January 2013; b. 05/2014 MV no isch/infarct, EF 60%.  . Hypertension   . H/O hiatal hernia   . Gastritis   . [name]'s esophagus   . IBS (irritable bowel syndrome)   . Macular degeneration   . Diverticulitis   . Perforated bowel ([place])   . Anemia   . Atrial fibrillation with RVR ([place])   . Perforated sigmoid colon ([place])   . Cellulitis of extremity   . Pelvic abscess in female   . PONV (postoperative nausea and vomiting)  Past Surgical History  Procedure Laterality Date  . Nissen fundoplication  99991111  . Rotator cuff repair  ~ 2001    right  . Coronary angioplasty with stent placement  05/08/11    "1"  . Tonsillectomy  ~ 1944  . Shoulder arthroscopy  ~ 2004; 2005    left; "joint's wore out"  . Cataract extraction w/ intraocular lens  implant, bilateral  ~ 2002  . Tubal ligation  1972  . Percutaneous  coronary stent intervention (pci-s) N/A 05/08/2011    Procedure: PERCUTANEOUS CORONARY STENT INTERVENTION (PCI-S);  Surgeon: Wellington Hampshire, MD;  Location[facility] CATH LAB;  Service: Cardiovascular;  Laterality: N/A;  . Esophageal manometry N/A 06/06/2014    Procedure: ESOPHAGEAL MANOMETRY (EM);  Surgeon: Jerene Bears, MD;  Location: [facility] ENDOSCOPY;  Service: Gastroenterology;  Laterality: N/A;  . Laparoscopic nissen fundoplication N/A [id number]    Procedure: LAPAROSCOPIC  LYSIS OF ADHESIONS, SEGMENTAL GASTRECTOMY, PARTIAL REDUCTION OF HERNIA;  Surgeon: [name], MD;  Location: [facility] ORS;  Service: General;  Laterality: N/A;  . Hernia repair    . Colectomy with colostomy creation/hartmann procedure N/A 03/05/2015    Procedure: PARTIAL COLECTOMY WITH COLOSTOMY CREATION/HARTMANN PROCEDURE;  Surgeon: Aviva Signs, MD;  Location: [facility] ORS;  Service: General;  Laterality: N/A;  . Cardiac catheterization Left 03/05/2015    Procedure: CENTRAL LINE INSERTION;  Surgeon: Aviva Signs, MD;  Location: [facility] ORS;  Service: General;  Laterality: Left;  . Esophagogastroduodenoscopy (egd) with propofol N/A 08/04/2015    Procedure: ESOPHAGOGASTRODUODENOSCOPY (EGD) WITH PROPOFOL;  Surgeon: Mauri Pole, MD;  Location: [facility] ENDOSCOPY;  Service: Endoscopy;  Laterality: N/A;     reports that she quit smoking about 27 years ago. Her smoking use included Cigarettes. She started smoking about 56 years ago. She has a 20 pack-year smoking history. She has never used smokeless tobacco. She reports that she does not drink alcohol or use illicit drugs.  Allergies  Allergen Reactions  . Sulfamethoxazole Rash    Family History  Problem Relation Age of Onset  . Heart attack Father   . Hypertension Father   . Diabetes Mother   . Hypertension Mother   . Colon cancer Neg Hx   . Esophageal cancer Neg Hx   . Rectal cancer Neg Hx   . Stomach cancer Neg Hx   . Skin cancer Brother     melanoma    Prior to Admission medications     Medication Sig Start Date End Date Taking? Authorizing Provider  albuterol (PROVENTIL HFA;VENTOLIN HFA) 108 (90 Base) MCG/ACT inhaler Inhale 2 puffs into the lungs every 6 (six) hours as needed for wheezing or shortness of breath.    Yes Historical Provider, MD  ALPRAZolam (XANAX) 0.25 MG tablet Take 1 tablet (0.25 mg total) by mouth 2 (two) times daily as needed for anxiety. 05/08/15  Yes Theodis Blaze, MD  citalopram (CELEXA) 40 MG tablet Take 20 mg by mouth at bedtime.    Yes Historical Provider, MD  diltiazem (CARDIZEM CD) 240 MG 24 hr capsule Take 1 capsule (240 mg total) by mouth daily. 08/09/15  Yes Hosie Poisson, MD  esomeprazole (NEXIUM) 40 MG capsule Take 40 mg by mouth daily before breakfast.    Yes Historical Provider, MD  fat emulsion 20 % infusion Inject 240 mLs into the vein continuous. Patient taking differently: Inject 240 mLs into the vein continuous. On for 18 hours and off 6 hours 08/09/15  Yes Hosie Poisson, MD  ferrous sulfate 325 (65  FE) MG EC tablet Take 325 mg by mouth 3 (three) times daily.    Yes Historical Provider, MD  furosemide (LASIX) 20 MG tablet Take 1 tablet (20 mg total) by mouth daily. 06/19/15  Yes Kinnie Feil, MD  hydrALAZINE (APRESOLINE) 25 MG tablet Take 1 tablet (25 mg total) by mouth 2 (two) times daily. 08/25/15  Yes Herminio Commons, MD  isosorbide mononitrate (IMDUR) 30 MG 24 hr tablet Take 30 mg by mouth daily. 07/21/15  Yes Historical Provider, MD  loratadine (CLARITIN) 10 MG tablet Take 10 mg by mouth daily.   Yes Historical Provider, MD  losartan (COZAAR) 100 MG tablet Take 1 tablet (100 mg total) by mouth daily. 11/11/12  Yes Herminio Commons, MD  metoprolol succinate (TOPROL-XL) 25 MG 24 hr tablet Take 1 tablet by mouth 2 (two) times daily. 08/24/15  Yes Historical Provider, MD  Multiple Vitamins-Minerals (PRESERVISION AREDS) TABS Take 1 tablet by mouth 2 (two) times daily.    Yes Historical Provider, MD  nitroGLYCERIN (NITROSTAT) 0.4 MG SL tablet  Place 0.4 mg under the tongue every 5 (five) minutes as needed for chest pain. Reported on 04/26/2015   Yes Historical Provider, MD  ondansetron (ZOFRAN-ODT) 4 MG disintegrating tablet Take 1 tablet (4 mg total) by mouth every 8 (eight) hours as needed for nausea or vomiting. 08/09/15  Yes Hosie Poisson, MD  polyethylene glycol powder (GLYCOLAX/MIRALAX) powder MIX 17 GRAMS IN AT LEAST 8OZ OF WATER/JUICE AND DRINK TWICE DAILY Patient taking differently: MIX 17 GRAMS IN AT LEAST 8OZ OF WATER/JUICE AND DRINK ONCE OR TWICE DAILY 06/21/15  Yes Jerene Bears, MD  potassium chloride (K-DUR) 10 MEQ tablet Take 1 tablet (10 mEq total) by mouth daily. 06/19/15  Yes Kinnie Feil, MD  rosuvastatin (CRESTOR) 10 MG tablet Take 10 mg by mouth at bedtime.    Yes Historical Provider, MD  traZODone (DESYREL) 100 MG tablet Take 100 mg by mouth at bedtime.    Yes Historical Provider, MD  Vitamin D, Ergocalciferol, (DRISDOL) 50000 UNITS CAPS Take 50,000 Units by mouth every Tuesday.    Yes Historical Provider, MD  zolpidem (AMBIEN) 10 MG tablet Take 10 mg by mouth at bedtime.   Yes Historical Provider, MD  apixaban (ELIQUIS) 2.5 MG TABS tablet Take 1 tablet (2.5 mg total) by mouth 2 (two) times daily. Patient not taking: Reported on 08/25/2015 08/09/15   Hosie Poisson, MD  metoCLOPramide (REGLAN) 5 MG tablet Take 1 tablet (5 mg total) by mouth 3 (three) times daily before meals. Patient not taking: Reported on 08/28/2015 06/14/15   Jerene Bears, MD  Propylene Glycol (SYSTANE BALANCE OP) Place 1-2 drops into both eyes daily as needed (dry eyes). Reported on 04/26/2015    Historical Provider, MD    Physical Exam: Filed Vitals:   08/28/15 1609 08/28/15 1610 08/28/15 1611 08/28/15 1612  BP:      Pulse: 55 54 55 56  Temp:      TempSrc:      Resp:      SpO2: 89% 100% 99% 100%      Constitutional: NAD, calm, comfortable Filed Vitals:   08/28/15 1609 08/28/15 1610 08/28/15 1611 08/28/15 1612  BP:      Pulse: 55 54 55 56   Temp:      TempSrc:      Resp:      SpO2: 89% 100% 99% 100%   Eyes: PERRL, lids and conjunctivae normal ENMT: Mucous membranes are moist. Posterior  pharynx clear of any exudate or lesions.Normal dentition.  Neck: normal, supple, no masses, no thyromegaly Respiratory: clear to auscultation bilaterally, no wheezing, no crackles. Normal respiratory effort. No accessory muscle use.  Cardiovascular: Regular rate and rhythm, no murmurs / rubs / gallops. No extremity edema. 2+ pedal pulses. No carotid bruits.  Abdomen: no tenderness, no masses palpated. No hepatosplenomegaly. Bowel sounds positive. Patient has a left lower quadrant colostomy Musculoskeletal: no clubbing / cyanosis. No joint deformity upper and lower extremities. Good ROM, no contractures. Normal muscle tone.  Skin: no rashes, lesions, ulcers. No induration Neurologic: CN 2-12 grossly intact. Sensation intact, DTR normal. Strength 5/5 in all 4.  Psychiatric: Normal judgment and insight. Alert and oriented x 3. Normal mood.     Labs on Admission: I have personally reviewed following labs and imaging studies  CBC:  Recent Labs Lab 08/28/15 1605  WBC 8.7  NEUTROABS 4.1  HGB 7.2*  HCT 22.0*  MCV 79.7  PLT 123456*   Basic Metabolic Panel:  Recent Labs Lab 08/28/15 1605  NA 131*  K 4.5  CL 100*  CO2 24  GLUCOSE 109*  BUN 36*  CREATININE 1.06*  CALCIUM 8.5*   GFR: Estimated Creatinine Clearance: 34.6 mL/min (by C-G formula based on Cr of 1.06). Liver Function Tests: No results for input(s): AST, ALT, ALKPHOS, BILITOT, PROT, ALBUMIN in the last 168 hours. No results for input(s): LIPASE, AMYLASE in the last 168 hours. No results for input(s): AMMONIA in the last 168 hours. Coagulation Profile: No results for input(s): INR, PROTIME in the last 168 hours. Cardiac Enzymes: No results for input(s): CKTOTAL, CKMB, CKMBINDEX, TROPONINI in the last 168 hours. BNP (last 3 results) No results for input(s): PROBNP in  the last 8760 hours. HbA1C: No results for input(s): HGBA1C in the last 72 hours. CBG: No results for input(s): GLUCAP in the last 168 hours. Lipid Profile: No results for input(s): CHOL, HDL, LDLCALC, TRIG, CHOLHDL, LDLDIRECT in the last 72 hours. Thyroid Function Tests: No results for input(s): TSH, T4TOTAL, FREET4, T3FREE, THYROIDAB in the last 72 hours. Anemia Panel: No results for input(s): VITAMINB12, FOLATE, FERRITIN, TIBC, IRON, RETICCTPCT in the last 72 hours. Urine analysis:    Component Value Date/Time   COLORURINE AMBER* 08/03/2015 1403   APPEARANCEUR CLOUDY* 08/03/2015 1403   LABSPEC 1.022 08/03/2015 1403   PHURINE 7.0 08/03/2015 1403   GLUCOSEU NEGATIVE 08/03/2015 1403   HGBUR LARGE* 08/03/2015 1403   BILIRUBINUR NEGATIVE 08/03/2015 1403   KETONESUR 40* 08/03/2015 1403   PROTEINUR 100* 08/03/2015 1403   UROBILINOGEN 0.2 02/10/2015 1936   NITRITE NEGATIVE 08/03/2015 1403   LEUKOCYTESUR TRACE* 08/03/2015 1403   Sepsis Labs: No results found for this or any previous visit (from the past 240 hour(s)).   Radiological Exams on Admission: No results found.   Assessment/Plan Anemia secondary to chronic blood loss related to right colon mass: Hemoglobin noted to have dropped over the last week from 7.9-->7.2 patient just recently stopped taking blood thinners of Eliquis. - Admit to a telemetry bed - Transfused 1 unit of PRBC - Goal hemoglobin of at least 8  - Patient to follow-up with [name] via telephone in am to have the colonic mass removed sometime soon that she does not want to miss.   Dehydration: As noted by the elevated BUN to creatinine ratio - Gentle IV fluids overnight  Symptomatic bradycardia/Paroxysmal atrial fibrillation: Stable at present on metoprolol. No longer on anticoagulation of Eliquis for reasons mentioned above (  high bleeding risk, anemic, falls risk). - Continue Cardizem, metoprolol    EF [id number], grade 3 diastolic dysfunction,  elevated filling pressures, and severe pulmonary hypertension, 70 mmHg. - Continue isosorbide mononitrate, losartan, metoprolol - Held Lasix secondary to signs of dehydration, restart when able Lasix   Essential hypertension: Stable - Continue blood pressure medications as seen above  Depression  - Continue Celexa   Insomnia  - Continue trazodone and Ambien adjusted for age   DVT prophylaxis: SCD 2/2 ? active bleed Code Status: Full Family Communication: Discussed plan with daughter at bedside Disposition Plan: Possible discharge in a.m. Consults called: GI Admission status: Observation telemetry  Norval Morton MD [facility] Pager [phone]  If 7PM-7AM, please contact night-coverage [email or web address] Password [facility]  08/28/2015, 5:27 PM

## 2015-08-28 NOTE — ED Notes (Signed)
Attempted to call report, RN unable at this time.

## 2015-08-28 NOTE — Progress Notes (Signed)
Attempted to call report, RN will call back.

## 2015-08-28 NOTE — ED Notes (Signed)
Attempted to call report, RN states she will call back.

## 2015-08-28 NOTE — ED Provider Notes (Signed)
CSN: [id number]     Arrival date & time 08/28/15  1531 History   First MD Initiated Contact with Patient 08/28/15 1535     Chief Complaint  Patient presents with  . Abnormal Lab      HPI Pt was seen at 1535. Per pt, c/o gradual onset and persistence of constant "low Hgb" over the past few weeks. Pt states her Home Health RN drew her routine labs today (because she is on TPN) and "told me to go to the ER because my Hgb dropped to 7." Pt states she has a known colon mass that she "needs to be taken out at a tertiary care center because I'm high risk." Endorses "black" stools "for a while because I take iron." Denies CP/SOB, no abd pain, no N/V, no change in stool consistency, no red stools in colostomy bag, no back pain, no fevers.    Past Medical History  Diagnosis Date  . Depressive disorder, not elsewhere classified   . Anxiety state, unspecified   . Pure hypercholesterolemia   . Esophageal reflux   . Infection of esophagostomy ([place])   . Congestive heart failure, unspecified     diastolic heart failure  . Pulmonary hypertension (HCC)     PA systolic pressure [id number] mmHg by echocardiogram, PA pressure 33/10 by cardiac catheterization PA saturation 62% thermodilution cardiac index 2.0 thick cardiac index 2.4  . Tricuspid regurgitation     moderate tricuspid regurgitation by echocardiogram, prior use of anorexic agents  . Melanoma ([place])     removal on back   . Coronary artery disease     a. Moderate to severe coronary disease involving the left anterior descending artery and right coronary artery.  Sequential stenosis in the LAD is significant.  Right coronary artery is moderate to severely likely nonischemic. Questionable small LVOT obstruction.  No Brockenbrough maneuver was performed.  Catheterization January 2013; b. 05/2014 MV no isch/infarct, EF 60%.  . Hypertension   . H/O hiatal hernia   . Gastritis   . [name]'s esophagus   . IBS (irritable bowel syndrome)   . Macular  degeneration   . Diverticulitis   . Perforated bowel ([place])   . Anemia   . Atrial fibrillation with RVR ([place])   . Perforated sigmoid colon ([place])   . Cellulitis of extremity   . Pelvic abscess in female   . PONV (postoperative nausea and vomiting)    Past Surgical History  Procedure Laterality Date  . Nissen fundoplication  [id number]  . Rotator cuff repair  ~ 2001    right  . Coronary angioplasty with stent placement  05/08/11    "1"  . Tonsillectomy  ~ 1944  . Shoulder arthroscopy  ~ 2004; 2005    left; "joint's wore out"  . Cataract extraction w/ intraocular lens  implant, bilateral  ~ 2002  . Tubal ligation  1972  . Percutaneous coronary stent intervention (pci-s) N/A 05/08/2011    Procedure: PERCUTANEOUS CORONARY STENT INTERVENTION (PCI-S);  Surgeon: Wellington Hampshire, MD;  Location: [facility] CATH LAB;  Service: Cardiovascular;  Laterality: N/A;  . Esophageal manometry N/A 06/06/2014    Procedure: ESOPHAGEAL MANOMETRY (EM);  Surgeon: Jerene Bears, MD;  Location: [facility] ENDOSCOPY;  Service: Gastroenterology;  Laterality: N/A;  . Laparoscopic nissen fundoplication N/A [id number]    Procedure: LAPAROSCOPIC  LYSIS OF ADHESIONS, SEGMENTAL GASTRECTOMY, PARTIAL REDUCTION OF HERNIA;  Surgeon: [name], MD;  Location: [facility] ORS;  Service: General;  Laterality: N/A;  . Hernia repair    .  Colectomy with colostomy creation/hartmann procedure N/A 03/05/2015    Procedure: PARTIAL COLECTOMY WITH COLOSTOMY CREATION/HARTMANN PROCEDURE;  Surgeon: Aviva Signs, MD;  Location: [facility] ORS;  Service: General;  Laterality: N/A;  . Cardiac catheterization Left 03/05/2015    Procedure: CENTRAL LINE INSERTION;  Surgeon: Aviva Signs, MD;  Location: [facility] ORS;  Service: General;  Laterality: Left;  . Esophagogastroduodenoscopy (egd) with propofol N/A 08/04/2015    Procedure: ESOPHAGOGASTRODUODENOSCOPY (EGD) WITH PROPOFOL;  Surgeon: Mauri Pole, MD;  Location: [facility] ENDOSCOPY;  Service: Endoscopy;  Laterality: N/A;   Family History   Problem Relation Age of Onset  . Heart attack Father   . Hypertension Father   . Diabetes Mother   . Hypertension Mother   . Colon cancer Neg Hx   . Esophageal cancer Neg Hx   . Rectal cancer Neg Hx   . Stomach cancer Neg Hx   . Skin cancer Brother     melanoma   Social History  Substance Use Topics  . Smoking status: Former Smoker -- 1.00 packs/day for 20 years    Types: Cigarettes    Start date: 10/12/1958    Quit date: 04/26/1988  . Smokeless tobacco: Never Used  . Alcohol Use: No    Review of Systems ROS: Statement: All systems negative except as marked or noted in the HPI; Constitutional: Negative for fever and chills. ; ; Eyes: Negative for eye pain, redness and discharge. ; ; ENMT: Negative for ear pain, hoarseness, nasal congestion, sinus pressure and sore throat. ; ; Cardiovascular: Negative for chest pain, palpitations, diaphoresis, dyspnea and peripheral edema. ; ; Respiratory: Negative for cough, wheezing and stridor. ; ; Gastrointestinal: +"black stools." Negative for nausea, vomiting, diarrhea, abdominal pain, blood in stool, hematemesis, jaundice and rectal bleeding. . ; ; Genitourinary: Negative for dysuria, flank pain and hematuria. ; ; Musculoskeletal: Negative for back pain and neck pain. Negative for swelling and trauma.; ; Skin: Negative for pruritus, rash, abrasions, blisters, bruising and skin lesion.; ; Neuro: Negative for headache, lightheadedness and neck stiffness. Negative for weakness, altered level of consciousness, altered mental status, extremity weakness, paresthesias, involuntary movement, seizure and syncope.      Allergies  Sulfamethoxazole  Home Medications   Prior to Admission medications   Medication Sig Start Date End Date Taking? Authorizing Provider  albuterol (PROVENTIL HFA;VENTOLIN HFA) 108 (90 Base) MCG/ACT inhaler Inhale 2 puffs into the lungs every 6 (six) hours as needed for wheezing or shortness of breath.    Yes Historical  Provider, MD  ALPRAZolam (XANAX) 0.25 MG tablet Take 1 tablet (0.25 mg total) by mouth 2 (two) times daily as needed for anxiety. 05/08/15  Yes Theodis Blaze, MD  citalopram (CELEXA) 40 MG tablet Take 20 mg by mouth at bedtime.    Yes Historical Provider, MD  diltiazem (CARDIZEM CD) 240 MG 24 hr capsule Take 1 capsule (240 mg total) by mouth daily. 08/09/15  Yes Hosie Poisson, MD  esomeprazole (NEXIUM) 40 MG capsule Take 40 mg by mouth daily before breakfast.    Yes Historical Provider, MD  fat emulsion 20 % infusion Inject 240 mLs into the vein continuous. Patient taking differently: Inject 240 mLs into the vein continuous. On for 18 hours and off 6 hours 08/09/15  Yes Hosie Poisson, MD  ferrous sulfate 325 (65 FE) MG EC tablet Take 325 mg by mouth 3 (three) times daily.    Yes Historical Provider, MD  furosemide (LASIX) 20 MG tablet Take 1 tablet (20 mg total) by  mouth daily. 06/19/15  Yes Kinnie Feil, MD  hydrALAZINE (APRESOLINE) 25 MG tablet Take 1 tablet (25 mg total) by mouth 2 (two) times daily. 08/25/15  Yes Herminio Commons, MD  isosorbide mononitrate (IMDUR) 30 MG 24 hr tablet Take 30 mg by mouth daily. 07/21/15  Yes Historical Provider, MD  loratadine (CLARITIN) 10 MG tablet Take 10 mg by mouth daily.   Yes Historical Provider, MD  losartan (COZAAR) 100 MG tablet Take 1 tablet (100 mg total) by mouth daily. 11/11/12  Yes Herminio Commons, MD  metoprolol succinate (TOPROL-XL) 25 MG 24 hr tablet Take 1 tablet by mouth 2 (two) times daily. 08/24/15  Yes Historical Provider, MD  Multiple Vitamins-Minerals (PRESERVISION AREDS) TABS Take 1 tablet by mouth 2 (two) times daily.    Yes Historical Provider, MD  nitroGLYCERIN (NITROSTAT) 0.4 MG SL tablet Place 0.4 mg under the tongue every 5 (five) minutes as needed for chest pain. Reported on 04/26/2015   Yes Historical Provider, MD  ondansetron (ZOFRAN-ODT) 4 MG disintegrating tablet Take 1 tablet (4 mg total) by mouth every 8 (eight) hours as  needed for nausea or vomiting. 08/09/15  Yes Hosie Poisson, MD  polyethylene glycol powder (GLYCOLAX/MIRALAX) powder MIX 17 GRAMS IN AT LEAST 8OZ OF WATER/JUICE AND DRINK TWICE DAILY Patient taking differently: MIX 17 GRAMS IN AT LEAST 8OZ OF WATER/JUICE AND DRINK ONCE OR TWICE DAILY 06/21/15  Yes Jerene Bears, MD  potassium chloride (K-DUR) 10 MEQ tablet Take 1 tablet (10 mEq total) by mouth daily. 06/19/15  Yes Kinnie Feil, MD  rosuvastatin (CRESTOR) 10 MG tablet Take 10 mg by mouth at bedtime.    Yes Historical Provider, MD  traZODone (DESYREL) 100 MG tablet Take 100 mg by mouth at bedtime.    Yes Historical Provider, MD  Vitamin D, Ergocalciferol, (DRISDOL) 50000 UNITS CAPS Take 50,000 Units by mouth every Tuesday.    Yes Historical Provider, MD  zolpidem (AMBIEN) 10 MG tablet Take 10 mg by mouth at bedtime.   Yes Historical Provider, MD  apixaban (ELIQUIS) 2.5 MG TABS tablet Take 1 tablet (2.5 mg total) by mouth 2 (two) times daily. Patient not taking: Reported on 08/25/2015 08/09/15   Hosie Poisson, MD  metoCLOPramide (REGLAN) 5 MG tablet Take 1 tablet (5 mg total) by mouth 3 (three) times daily before meals. Patient not taking: Reported on 08/28/2015 06/14/15   Jerene Bears, MD  Propylene Glycol (SYSTANE BALANCE OP) Place 1-2 drops into both eyes daily as needed (dry eyes). Reported on 04/26/2015    Historical Provider, MD   BP 132/57 mmHg  Pulse 55  Temp(Src) 97.9 F (36.6 C) (Oral)  Resp 17  SpO2 99% Physical Exam  1540: Physical examination:  Nursing notes reviewed; Vital signs and O2 SAT reviewed;  Constitutional: Thin, frail. In no acute distress; Head:  Normocephalic, atraumatic; Eyes: EOMI, PERRL, No scleral icterus; ENMT: Mouth and pharynx normal, Mucous membranes moist; Neck: Supple, Full range of motion, No lymphadenopathy; Cardiovascular: Regular rate and rhythm, No gallop; Respiratory: Breath sounds clear & equal bilaterally, No wheezes.  Speaking full sentences with ease, Normal  respiratory effort/excursion; Chest: Nontender, Movement normal; Abdomen: Soft, Nontender, Nondistended, Normal bowel sounds. +colostomy bag left abd with black stool, heme positive.; Genitourinary: No CVA tenderness; Extremities: Pulses normal, +RUE PICC. No tenderness, No edema, No calf edema or asymmetry.; Neuro: AA&Ox3, Major CN grossly intact.  Speech clear. No gross focal motor or sensory deficits in extremities.; Skin: Color pale, Warm, Dry.  ED Course  Procedures (including critical care time) Labs Review   Imaging Review  I have personally reviewed and evaluated these images and lab results as part of my medical decision-making.   EKG Interpretation None      MDM  MDM Reviewed: previous chart, nursing note and vitals Reviewed previous: labs and ECG Interpretation: labs, ECG and x-[place] Total time providing critical care: 30-74 minutes. This excludes time spent performing separately reportable procedures and services. Consults: admitting MD and gastrointestinal     CRITICAL CARE Performed by: Alfonzo Feller Total critical care time: 35 minutes Critical care time was exclusive of separately billable procedures and treating other patients. Critical care was necessary to treat or prevent imminent or life-threatening deterioration. Critical care was time spent personally by me on the following activities: development of treatment plan with patient and/or surrogate as well as nursing, discussions with consultants, evaluation of patient's response to treatment, examination of patient, obtaining history from patient or surrogate, ordering and performing treatments and interventions, ordering and review of laboratory studies, ordering and review of radiographic studies, pulse oximetry and re-evaluation of patient's condition.   Results for orders placed or performed during the hospital encounter of [id number]  Basic metabolic panel  Result Value Ref Range   Sodium 131 (L) 135 -  145 mmol/L   Potassium 4.5 3.5 - 5.1 mmol/L   Chloride 100 (L) 101 - 111 mmol/L   CO2 24 22 - 32 mmol/L   Glucose, Bld 109 (H) 65 - 99 mg/dL   BUN 36 (H) 6 - 20 mg/dL   Creatinine, Ser 1.06 (H) 0.44 - 1.00 mg/dL   Calcium 8.5 (L) 8.9 - 10.3 mg/dL   GFR calc non Af Amer 49 (L) >60 mL/min   GFR calc Af Amer 57 (L) >60 mL/min   Anion gap 7 5 - 15  CBC with Differential  Result Value Ref Range   WBC 8.7 4.0 - 10.5 K/uL   RBC 2.76 (L) 3.87 - 5.11 MIL/uL   Hemoglobin 7.2 (L) 12.0 - 15.0 g/dL   HCT 22.0 (L) 36.0 - 46.0 %   MCV 79.7 78.0 - 100.0 fL   MCH 26.1 26.0 - 34.0 pg   MCHC 32.7 30.0 - 36.0 g/dL   RDW 16.7 (H) 11.5 - 15.5 %   Platelets 149 (L) 150 - 400 K/uL   Neutrophils Relative % 47 %   Lymphocytes Relative 21 %   Monocytes Relative 1 %   Eosinophils Relative 31 %   Basophils Relative 0 %   Band Neutrophils 0 %   Metamyelocytes Relative 0 %   Myelocytes 0 %   Promyelocytes Absolute 0 %   Blasts 0 %   nRBC 0 0 /100 WBC   Other 0 %   Neutro Abs 4.1 1.7 - 7.7 K/uL   Lymphs Abs 1.8 0.7 - 4.0 K/uL   Monocytes Absolute 0.1 0.1 - 1.0 K/uL   Eosinophils Absolute 2.7 (H) 0.0 - 0.7 K/uL   Basophils Absolute 0.0 0.0 - 0.1 K/uL  POC occult blood, ED  Result Value Ref Range   Fecal Occult Bld POSITIVE (A) NEGATIVE  Type and screen  Result Value Ref Range   ABO/RH(D) O POS    Antibody Screen NEG    Sample Expiration 08/31/2015    Unit Number [id number]    Blood Component Type RED CELLS,LR    Unit division 00    Status of Unit ALLOCATED    Transfusion Status OK TO TRANSFUSE  Crossmatch Result Compatible    Unit Number [id number]    Blood Component Type RED CELLS,LR    Unit division 00    Status of Unit ISSUED    Transfusion Status OK TO TRANSFUSE    Crossmatch Result Compatible   Prepare RBC  Result Value Ref Range   Order Confirmation ORDER PROCESSED BY BLOOD BANK     Results for Faucett, MAR SCHWABAUER (MRN [id number]) as of 08/28/2015 18:46  Ref. Range 08/06/2015 01:09  08/07/2015 03:54 08/08/2015 05:00 08/09/2015 03:30 08/28/2015 16:05  Hemoglobin Latest Ref Range: 12.0-15.0 g/dL 10.1 (L) 10.3 (L) 10.2 (L) 9.6 (L) 7.2 (L)  HCT Latest Ref Range: 36.0-46.0 % 31.5 (L) 31.0 (L) 30.8 (L) 29.4 (L) 22.0 (L)  Platelets Latest Ref Range: 150-400 K/uL 187 190 202 186 149 (L)     1735:  H/H lower than previous 2.5 weeks ago. Will transfuse PRBC's, admit. Dx and testing d/w pt and family.  Questions answered.  Verb understanding, agreeable to admit. T/C to [facility] Dr. Tamala Julian, [place] discussed, including:  HPI, pertinent PM/SHx, VS/PE, dx testing, ED course and treatment:  Agreeable to admit, requests to call GI to consult, write temporary orders, obtain medical bed to team [facility]. T/C to GI Dr. Laural Golden, [place] discussed, including:  HPI, pertinent PM/SHx, VS/PE, dx testing, ED course and treatment:  Agreeable to consult, states pt needs PRBC transfusion, she already had EGD 08/04/15, will not perform any further interventions at this time. [facility] MD made aware.     Francine Graven, DO 08/30/15 2131

## 2015-08-28 NOTE — ED Notes (Signed)
Pt states her home health nurse drew blood work this morning. States she was told her hemoglobin was 7 and to come to the ED for evaluation

## 2015-08-29 ENCOUNTER — Encounter (HOSPITAL_COMMUNITY): Payer: Self-pay | Admitting: Internal Medicine

## 2015-08-29 ENCOUNTER — Telehealth: Payer: Self-pay | Admitting: Cardiovascular Disease

## 2015-08-29 DIAGNOSIS — R7989 Other specified abnormal findings of blood chemistry: Secondary | ICD-10-CM

## 2015-08-29 DIAGNOSIS — K3189 Other diseases of stomach and duodenum: Secondary | ICD-10-CM

## 2015-08-29 DIAGNOSIS — K208 Other esophagitis: Secondary | ICD-10-CM | POA: Diagnosis not present

## 2015-08-29 DIAGNOSIS — E860 Dehydration: Secondary | ICD-10-CM | POA: Diagnosis present

## 2015-08-29 DIAGNOSIS — I480 Paroxysmal atrial fibrillation: Secondary | ICD-10-CM

## 2015-08-29 DIAGNOSIS — D649 Anemia, unspecified: Secondary | ICD-10-CM | POA: Diagnosis not present

## 2015-08-29 DIAGNOSIS — D500 Iron deficiency anemia secondary to blood loss (chronic): Secondary | ICD-10-CM

## 2015-08-29 DIAGNOSIS — I10 Essential (primary) hypertension: Secondary | ICD-10-CM

## 2015-08-29 DIAGNOSIS — K2210 Ulcer of esophagus without bleeding: Secondary | ICD-10-CM | POA: Diagnosis present

## 2015-08-29 LAB — COMPREHENSIVE METABOLIC PANEL
ALT: 14 U/L (ref 14–54)
ANION GAP: 6 (ref 5–15)
AST: 14 U/L — ABNORMAL LOW (ref 15–41)
Albumin: 2.6 g/dL — ABNORMAL LOW (ref 3.5–5.0)
Alkaline Phosphatase: 58 U/L (ref 38–126)
BUN: 32 mg/dL — ABNORMAL HIGH (ref 6–20)
CHLORIDE: 103 mmol/L (ref 101–111)
CO2: 26 mmol/L (ref 22–32)
Calcium: 8.5 mg/dL — ABNORMAL LOW (ref 8.9–10.3)
Creatinine, Ser: 1.13 mg/dL — ABNORMAL HIGH (ref 0.44–1.00)
GFR calc non Af Amer: 45 mL/min — ABNORMAL LOW (ref >60)
GFR, EST AFRICAN AMERICAN: 53 mL/min — AB (ref >60)
Glucose, Bld: 98 mg/dL (ref 65–99)
Potassium: 4.3 mmol/L (ref 3.5–5.1)
SODIUM: 135 mmol/L (ref 135–145)
Total Bilirubin: 0.2 mg/dL — ABNORMAL LOW (ref 0.3–1.2)
Total Protein: 5.6 g/dL — ABNORMAL LOW (ref 6.5–8.1)

## 2015-08-29 LAB — CBC
HEMATOCRIT: 24.3 % — AB (ref 36.0–46.0)
HEMOGLOBIN: 8.1 g/dL — AB (ref 12.0–15.0)
MCH: 27.1 pg (ref 26.0–34.0)
MCHC: 33.3 g/dL (ref 30.0–36.0)
MCV: 81.3 fL (ref 78.0–100.0)
PLATELETS: 159 10*3/uL (ref 150–400)
RBC: 2.99 MIL/uL — AB (ref 3.87–5.11)
RDW: 16.8 % — AB (ref 11.5–15.5)
WBC: 8.8 10*3/uL (ref 4.0–10.5)

## 2015-08-29 LAB — PREALBUMIN: Prealbumin: 16.7 mg/dL — ABNORMAL LOW (ref 18–38)

## 2015-08-29 LAB — PHOSPHORUS: PHOSPHORUS: 4.5 mg/dL (ref 2.5–4.6)

## 2015-08-29 LAB — MAGNESIUM: MAGNESIUM: 1.8 mg/dL (ref 1.7–2.4)

## 2015-08-29 LAB — TRIGLYCERIDES: TRIGLYCERIDES: 32 mg/dL (ref <150)

## 2015-08-29 MED ORDER — POLYETHYLENE GLYCOL 3350 17 GM/SCOOP PO POWD: ORAL | Status: DC

## 2015-08-29 MED ORDER — HEPARIN SOD (PORK) LOCK FLUSH 100 UNIT/ML IV SOLN: 250.0000 [IU] | INTRAVENOUS | Status: DC | PRN

## 2015-08-29 MED ORDER — SODIUM CHLORIDE 0.9% FLUSH
10.0000 mL | INTRAVENOUS | Status: DC | PRN
  Administered 2015-08-29: 10 mL

## 2015-08-29 MED ORDER — DILTIAZEM HCL ER COATED BEADS 120 MG PO CP24
120.0000 mg | ORAL_CAPSULE | Freq: Every day | ORAL | Status: DC
  Administered 2015-08-29: 120 mg via ORAL
  Filled 2015-08-29: qty 1

## 2015-08-29 MED ORDER — FUROSEMIDE 20 MG PO TABS
20.0000 mg | ORAL_TABLET | Freq: Every day | ORAL | Status: DC
  Administered 2015-08-29: 20 mg via ORAL
  Filled 2015-08-29: qty 1

## 2015-08-29 MED ORDER — HEPARIN SOD (PORK) LOCK FLUSH 100 UNIT/ML IV SOLN
500.0000 [IU] | INTRAVENOUS | Status: AC | PRN
  Administered 2015-08-29: 500 [IU]
  Filled 2015-08-29: qty 5

## 2015-08-29 MED ORDER — FAT EMULSION 20 % IV EMUL
240.0000 mL | INTRAVENOUS | Status: DC
Start: 2015-08-29 — End: 2015-10-31

## 2015-08-29 MED ORDER — SODIUM CHLORIDE 0.9% FLUSH
10.0000 mL | Freq: Two times a day (BID) | INTRAVENOUS | Status: DC
  Administered 2015-08-29: 10 mL

## 2015-08-29 MED ORDER — METOPROLOL SUCCINATE ER 25 MG PO TB24: 25.0000 mg | ORAL_TABLET | Freq: Every day | ORAL | Status: DC

## 2015-08-29 NOTE — Progress Notes (Signed)
Discharge instructions given, verbalized understanding, out in stable condition via w/c with staff. 

## 2015-08-29 NOTE — Progress Notes (Signed)
BRIEF NUTRITION NOTE  Consult for new TPN/TNA. Pt being discharged today on home TPN with soft diet. Pt had discharge summary at bedside at time of visit and stated that her daughter is on her way to pick her up. Pt states her appetite is better and she is tolerating more soft foods. Pt says she hopes to be able to wean off TPN soon. Pt familiar with TPN as she was on it for about a month PTA. Pt has no questions nor concerns regarding nutrition/TPN at this time. Please re-consult if nutrition issues arise.   Geoffery Lyons, [place] [facility] Dietetic Intern Pager [phone]

## 2015-08-29 NOTE — Telephone Encounter (Signed)
[name] called stating that she received a telephone call from [facility] stating that they need pre-op clearance to be faxed to Dr. Margart Sickles .  Phone # [phone]   Fax # [phone]

## 2015-08-29 NOTE — Discharge Summary (Signed)
Physician Discharge Summary  [name] [id number] DOB: [birth year] DOA: 08/28/2015  PCP: Heather Sleeper, PA-C  Admit date: 08/28/2015 Discharge date: 08/29/2015  Time spent: Greater than 30 minutes  Recommendations for Outpatient Follow-up:  1. Recommend follow-up of the patient's hemoglobin/hematocrit.  2. Patient will be following up with Dr. Christen Richardson at [facility] for evaluation of removing the colonic mass in the near future.   Discharge Diagnoses:  1. Anemia secondary to chronic blood loss associated with the right colon mass and in the setting of anticoagulation on Eliquis. 2. Right colonic mass. 3. History of recent gastric volvulus with gastric outlet obstruction and erosive esophagitis per recent EGD. 4. History of perforated diverticulitis, status post partial colectomy and colostomy. 5. Mild dehydration with prerenal azotemia. 6. Paroxysmal atrial fibrillation with mild bradycardia. 7. Coronary artery disease. 8. Chronic diastolic heart failure. 9. Hypertension.  Discharge Condition: Improved.  Diet recommendation: Heart healthy and TPN.  Filed Weights   08/28/15 2051  Weight: 55.43 kg (122 lb 3.2 oz)    History of present illness:  Patient is a 81 year old woman with a history of CAD, chronic diastolic heart failure, paroxysmal atrial fibrillation previously on Eliquis, perforated diverticulitis status post resection with colostomy, right colon mass under evaluation for surgical resection, and recent volvulus with outlet obstruction currently on TPN, who presented to the ED on 08/28/15 after outpatient laboratory studies revealed worsening anemia. In the ED, the patient was afebrile, mildly bradycardic with a heart rate between 5156, normal blood pressure, and oxygen saturations in the 90s on room air. Lab data were significant for hemoglobin of 7.2, sodium of 131, potassium of 4.5, BUN of 36, and creatinine of 1.06. Chest x-Nneoma revealed stable right sided pleural effusion. She  was admitted for further evaluation and management.  Hospital Course:  Patient was transfused 1 unit of packed red blood cells following admission. Gentle IV fluids were started for mild prerenal azotemia and hyponatremia. All of her chronic medications were continued, but Lasix was temporarily withheld due to prerenal azotemia/mild dehydration and metoprolol was initially withheld due to bradycardia. Eliquis was discontinued prior to hospital admission when it was found that her hemoglobin was below 8 g. Following the transfusion, her hemoglobin improved appropriately to 8.1. Her sodium improved to 135. Her BUN improved to 32.  At the time of discharge, the patient had no complaints of dizziness, chest pain, or shortness of breath. She was seemed dynamically stable. Stool in her colostomy was not black and there was no evidence of bright red blood. Due to her bradycardia, she was instructed to decreased metoprolol to once daily rather than twice a day and to maintain her other rate limiting medication Cardizem as previously prescribed at the time of discharge. She will be phoning Dr. Frutoso Richardson at [facility] for arranging an evaluation to have her colon mass removed some time in the near future.   Procedures:  Transfusion of 1 unit of packed red blood cells.  Consultations:  None  Discharge Exam: Filed Vitals:   08/28/15 2051 08/29/15 0548  BP: 165/65 136/54  Pulse: 57 56  Temp: 97.3 F (36.3 C) 97.9 F (36.6 C)  Resp: 20 20  Oxygen saturation 95% on room air.  General: Pleasant 81 year old woman in no acute distress. Cardiovascular: S1, S2, with a soft systolic murmur. Respiratory: Decreased breath sounds in the bases, otherwise clear. Abdomen: Positive bowel sounds, soft, ostomy noted with light brown colored stools; nontender.  Discharge Instructions   Discharge Instructions  Diet - low sodium heart healthy    Complete by:  As directed      Discharge instructions    Complete by:   As directed   Your metoprolol XL was changed to once daily rather than 2 times daily. You'll need your heart rate and your blood counts rechecked at your hospital follow-up appointments.     Increase activity slowly    Complete by:  As directed           Current Discharge Medication List    CONTINUE these medications which have CHANGED   Details  fat emulsion 20 % infusion Inject 240 mLs into the vein continuous. On for 18 hours and off 6 hours    metoprolol succinate (TOPROL-XL) 25 MG 24 hr tablet Take 1 tablet (25 mg total) by mouth daily.    polyethylene glycol powder (GLYCOLAX/MIRALAX) powder MIX 17 GRAMS IN AT LEAST 8OZ OF WATER/JUICE AND DRINK ONCE OR TWICE DAILY      CONTINUE these medications which have NOT CHANGED   Details  albuterol (PROVENTIL HFA;VENTOLIN HFA) 108 (90 Base) MCG/ACT inhaler Inhale 2 puffs into the lungs every 6 (six) hours as needed for wheezing or shortness of breath.     ALPRAZolam (XANAX) 0.25 MG tablet Take 1 tablet (0.25 mg total) by mouth 2 (two) times daily as needed for anxiety. Qty: 60 tablet, Refills: 0    citalopram (CELEXA) 40 MG tablet Take 20 mg by mouth at bedtime.     diltiazem (CARDIZEM CD) 240 MG 24 hr capsule Take 1 capsule (240 mg total) by mouth daily. Qty: 30 capsule, Refills: 0    esomeprazole (NEXIUM) 40 MG capsule Take 40 mg by mouth daily before breakfast.     ferrous sulfate 325 (65 FE) MG EC tablet Take 325 mg by mouth 3 (three) times daily.     furosemide (LASIX) 20 MG tablet Take 1 tablet (20 mg total) by mouth daily. Qty: 30 tablet, Refills: 0    hydrALAZINE (APRESOLINE) 25 MG tablet Take 1 tablet (25 mg total) by mouth 2 (two) times daily. Qty: 60 tablet, Refills: 6    isosorbide mononitrate (IMDUR) 30 MG 24 hr tablet Take 30 mg by mouth daily.    loratadine (CLARITIN) 10 MG tablet Take 10 mg by mouth daily.    losartan (COZAAR) 100 MG tablet Take 1 tablet (100 mg total) by mouth daily. Qty: 30 tablet, Refills:  6    Multiple Vitamins-Minerals (PRESERVISION AREDS) TABS Take 1 tablet by mouth 2 (two) times daily.     nitroGLYCERIN (NITROSTAT) 0.4 MG SL tablet Place 0.4 mg under the tongue every 5 (five) minutes as needed for chest pain. Reported on 04/26/2015    ondansetron (ZOFRAN-ODT) 4 MG disintegrating tablet Take 1 tablet (4 mg total) by mouth every 8 (eight) hours as needed for nausea or vomiting. Qty: 30 tablet, Refills: 0    potassium chloride (K-DUR) 10 MEQ tablet Take 1 tablet (10 mEq total) by mouth daily. Qty: 30 tablet, Refills: 0    rosuvastatin (CRESTOR) 10 MG tablet Take 10 mg by mouth at bedtime.     traZODone (DESYREL) 100 MG tablet Take 100 mg by mouth at bedtime.     Vitamin D, Ergocalciferol, (DRISDOL) 50000 UNITS CAPS Take 50,000 Units by mouth every Tuesday.     zolpidem (AMBIEN) 10 MG tablet Take 10 mg by mouth at bedtime.    Propylene Glycol (SYSTANE BALANCE OP) Place 1-2 drops into both eyes daily as needed (  dry eyes). Reported on 04/26/2015      STOP taking these medications     apixaban (ELIQUIS) 2.5 MG TABS tablet      metoCLOPramide (REGLAN) 5 MG tablet        Allergies  Allergen Reactions  . Sulfamethoxazole Rash   Follow-up Information    Schedule an appointment as soon as possible for a visit with Heather Sleeper, PA-C.   Specialty:  General Practice   Why:  F/UP IN 1-2 WEEKS FOR RECHECK OF YOUR HEART RATE AND BLOOD COUNT   Contact information:   [place] [address] [phone]        The results of significant diagnostics from this hospitalization (including imaging, microbiology, ancillary and laboratory) are listed below for reference.    Significant Diagnostic Studies: Dg Chest 2 View  08/03/2015  CLINICAL DATA:  Vomiting EXAM: CHEST  2 VIEW COMPARISON:  06/16/2015 FINDINGS: Cardiac shadow it is stable. Right basilar infiltrate with associated effusion is noted. The overall appearance is stable from the prior exam. No left  pleural effusion is seen. The left lung is clear. Old healing right rib fractures are noted. IMPRESSION: Right basilar infiltrate with associated effusion. Healing right rib fractures. Electronically Signed   By: Inez Catalina M.D.   On: 08/03/2015 16:59   Ct Head Wo Contrast  08/05/2015  CLINICAL DATA:  Slurred speech and tongue numbness lasting 5 minutes, almost resolved, history atrial fibrillation, high risk of TIA and stroke, was off anti coagulation due to history of GI bleed, history CHF, pulmonary hypertension, hypertension, former smoker EXAM: CT HEAD WITHOUT CONTRAST TECHNIQUE: Contiguous axial images were obtained from the base of the skull through the vertex without intravenous contrast. COMPARISON:  None FINDINGS: Generalized atrophy. Normal ventricular morphology. No midline shift or mass effect. Mild small vessel chronic ischemic changes of deep cerebral white matter. No intracranial hemorrhage, mass lesion, or evidence of acute infarction. No extra-axial fluid collections. Atherosclerotic calcifications at carotid siphons and vertebral arteries. Bones and sinuses unremarkable. IMPRESSION: Atrophy with small vessel chronic ischemic changes of deep cerebral white matter. No acute intracranial abnormalities. Electronically Signed   By: Lavonia Dana M.D.   On: 08/05/2015 16:53   Ct Abdomen Pelvis W Contrast  08/03/2015  CLINICAL DATA:  History of colon cancer now with persistent nausea and vomiting. EXAM: CT ABDOMEN AND PELVIS WITH CONTRAST TECHNIQUE: Multidetector CT imaging of the abdomen and pelvis was performed using the standard protocol following bolus administration of intravenous contrast. CONTRAST:  29mL ISOVUE-300 IOPAMIDOL (ISOVUE-300) INJECTION 61% COMPARISON:  Right upper quadrant abdominal ultrasound - 06/29/2015; CT abdomen pelvis -05/18/2015 ; 05/01/2015 FINDINGS: Lower chest: Limited visualization of lower thorax demonstrates resolution of previously noted small to moderate size  left-sided effusion. Interval decrease in persistent small to moderate-sized right-sided effusion and associated partial atelectasis/ collapse of the right lower lobe. No focal airspace opacities. Normal heart size. Coronary artery calcifications. No pericardial effusion. Hepatobiliary: Normal hepatic contour. No discrete hepatic lesions. Normal appearance of the gallbladder given degree distention. No radiopaque gallstones. No intra extrahepatic bili duct dilatation. No ascites. Pancreas: Appears normal, however multiple surgical clips are noted about the cranial aspect of the pancreatic tail. No pancreatic stranding. Spleen: Appears normal. Adrenals/Urinary Tract: There is symmetric enhancement and excretion of the bilateral kidneys. Re- demonstrated bilateral renal cysts with dominant partially exophytic cyst arising from the superior pole of the left kidney measuring 3.6 cm in diameter and dominant right-sided renal cyst measuring approximately 2.2  cm. Additional right-sided sub cm hypoattenuating lesions are too small to adequately characterize though favored to represent additional renal cysts. There is a punctate (approximately 0.4 cm) nonobstructing stone within the inferior pole the right kidney. No evidence of right-sided nephrolithiasis in this postcontrast examination. No urinary obstruction or perinephric stranding. Stomach/Bowel: Re- demonstrated large sliding hiatal hernia, however there is now marked fluid distention of the stomach. No definitive evidence of volvulus though gastric outlet obstruction could have a similar appearance. No definitive gastric wall thickening. Post left hemicolectomy with colostomy within the left mid hemi abdomen. No evidence of enteric obstruction. No pneumoperitoneum, pneumatosis or portal venous gas. Interval removal of left trans gluteal approach percutaneous drainage catheter without evidence of recurrent pelvic fluid collection. Vascular/Lymphatic: Large amount of  eccentric mixed calcified and noncalcified atherosclerotic plaque within a normal caliber abdominal aorta. The major branch vessels of the abdominal aorta appear patent on this non CTA examination. No bulky retroperitoneal, mesenteric, pelvic or inguinal lymphadenopathy. Reproductive: Pelvic organs are normal for age. No discrete adnexal lesion. Small amount of free fluid in the pelvic cul-de-sac. Other: Well-healed midline incision. Musculoskeletal: No acute or aggressive osseous abnormalities. Stigmata of DISH within the thoracic spine. IMPRESSION: 1. Re- demonstrated large hiatal hernia - while there is no definitive evidence of gastric volvulus, there is marked fluid distention of the stomach and as such, gastric outlet obstruction is not excluded on the basis of this examination. Clinical correlation is advised. Further evaluation with endoscopy could be performed as clinically indicated. 2. Post left hemi colectomy and end colostomy without evidence of enteric obstruction. 3. Interval removal of left trans gluteal approach percutaneous drainage catheter without evidence of new definable/drainable abdominal or pelvic fluid collection. 4. Unchanged solitary punctate (approximately 4 mm) nonobstructing right-sided renal stone. 5. Interval resolution of left-sided pleural effusion. Residual small to moderate size right-sided effusion has decreased in size in interval though there is persistent partial atelectasis/collapse of the right lower lobe. 6. Atherosclerosis including coronary artery calcifications. Electronically Signed   By: Sandi Mariscal M.D.   On: 08/03/2015 18:35   Dg Chest Port 1 View  08/28/2015  CLINICAL DATA:  Anemia. EXAM: PORTABLE CHEST 1 VIEW COMPARISON:  Aug 03, 2015. FINDINGS: The heart size and mediastinal contours are within normal limits. No pneumothorax is noted. Left lung is clear. Stable moderate right pleural effusion is noted with associated atelectasis or pneumonia. Right-sided PICC  line is noted with distal tip at expected location of cavoatrial junction. Old left rib fractures are again noted. Degenerative change of left glenohumeral joint is noted. IMPRESSION: Stable moderate right pleural effusion with associated atelectasis or pneumonia. Electronically Signed   By: Marijo Conception, M.D.   On: 08/28/2015 18:51   Dg Ugi W/high Density W/kub  08/07/2015  CLINICAL DATA:  Hiatal hernia. EXAM: UPPER GI SERIES WITH KUB TECHNIQUE: After obtaining a scout radiograph a routine upper GI series was performed using thin barium. FLUOROSCOPY TIME:  Radiation Exposure Index (as provided by the fluoroscopic device): If the device does not provide the exposure index: Fluoroscopy Time (in minutes and seconds): 1 minutes and 36 seconds. Number of Acquired Images: COMPARISON:  CT scan 08/03/2015. FINDINGS: Preprocedure KUB shows surgical clips in the region of the esophagogastric junction and in the lower right chest/mediastinum. Bowel gas pattern is nonspecific. Patient was given thin barium by mouth which passes readily through a somewhat tortuous appearing esophagus. No gross esophageal diverticulum or mass lesion. Contrast then flows into a large hiatal hernia  seen just to the right of midline in the lower chest. Contrast material is able to pass through the diaphragmatic hiatus into the antral region of the stomach which appears be below the hiatus. Roughly 2/3 to 3/4 of the stomach is above the hiatus. Contrast does pass through the pylorus into the proximal small bowel which is nondilated. Patient was given a 13 mm barium tablet which lodged briefly at the level of the distal esophagus but then passed into the stomach with additional swallows of water. IMPRESSION: Large hiatal hernia without complete mechanical obstruction. Contrast material passes into the distal stomach and proximal small bowel. Electronically Signed   By: Misty Stanley M.D.   On: 08/07/2015 10:17    Microbiology: No results  found for this or any previous visit (from the past 240 hour(s)).   Labs: Basic Metabolic Panel:  Recent Labs Lab 08/28/15 1605 08/29/15 0511  NA 131* 135  K 4.5 4.3  CL 100* 103  CO2 24 26  GLUCOSE 109* 98  BUN 36* 32*  CREATININE 1.06* 1.13*  CALCIUM 8.5* 8.5*  MG  --  1.8  PHOS  --  4.5   Liver Function Tests:  Recent Labs Lab 08/29/15 0511  AST 14*  ALT 14  ALKPHOS 58  BILITOT 0.2*  PROT 5.6*  ALBUMIN 2.6*   No results for input(s): LIPASE, AMYLASE in the last 168 hours. No results for input(s): AMMONIA in the last 168 hours. CBC:  Recent Labs Lab 08/28/15 1605 08/29/15 0511  WBC 8.7 8.8  NEUTROABS 4.1  --   HGB 7.2* 8.1*  HCT 22.0* 24.3*  MCV 79.7 81.3  PLT 149* 159   Cardiac Enzymes: No results for input(s): CKTOTAL, CKMB, CKMBINDEX, TROPONINI in the last 168 hours. BNP: BNP (last 3 results)  Recent Labs  06/16/15 2233 06/19/15 0555  BNP 1250.0* 217.0*    ProBNP (last 3 results) No results for input(s): PROBNP in the last 8760 hours.  CBG: No results for input(s): GLUCAP in the last 168 hours.     Signed:  Hawley Michel MD.  [facility] 08/29/2015, 10:12 AM

## 2015-08-29 NOTE — Progress Notes (Signed)
[facility]  Patient Status: Active pt with [facility]  [facility] is providing the following services: HHRN and Home TPN team for home TPN.  [place] team will follow pt during admission to support transition home.   If patient discharges after hours, please call [phone].   Larry Sierras 08/29/2015, 11:48 AM

## 2015-08-29 NOTE — Care Management Obs Status (Signed)
[place] NOTIFICATION   Patient Details  Name: [name] MRN: [id number] Date of Birth: 1934-06-26   Medicare Observation Status Notification Given:  Yes    Kateland Leisinger, Chauncey [place], RN 08/29/2015, 11:19 AM

## 2015-08-29 NOTE — Care Management Note (Signed)
[place] Management Note  Patient Details  Name: [name] MRN: [id number] Date of Birth: 1934-12-31  Subjective/Objective:    Patient is from home. Independent with ADL's. Has HH with [facility] currently. Has family support, they drive her to appointments and she reports no issues obtaining meds. No DME needs.                  Action/Plan: No CM needs, will place order for [facility] resumption and contact [place] to let them know patient is discharging today.         Expected Discharge Date:     08/29/2015             Expected Discharge Plan:  [place]  In-House Referral:     Discharge planning Services  CM Consult  Post Acute Care Choice:  NA Choice offered to:  NA  DME Arranged:    DME Agency:     HH Arranged:    [place] Agency:  [facility] [facility]  Status of Service:  Completed, signed off  Medicare Important Message Given:    Date Medicare IM Given:    Medicare IM give by:    Date Additional Medicare IM Given:    Additional Medicare Important Message give by:     If discussed at [place] of Stay Meetings, dates discussed:    Additional Comments:  Evany Schecter, Chauncey [place], RN 08/29/2015, 11:20 AM

## 2015-08-29 NOTE — Progress Notes (Signed)
GI consult had been request; per Dr. Caryn Section, no GI consultation needed. Orvil Feil, ANP-BC [facility]

## 2015-08-29 NOTE — Telephone Encounter (Signed)
Routed 08/25/15 OV - which addressed surgical clearance - to fax number [facility] as requested

## 2015-08-29 NOTE — Progress Notes (Signed)
[facility]  Patient Status: Active pt with [facility]   [facility] is providing the following services: HHRN and Home TPN Pharmacy team for home TPN. Per [employer], RN, CM, pt will DC back home today without changes to POC.  [facility] pharmacy and [facility] teams are prepared for resumption of care at home today.  Pt has TPN and supplies in the home.   If patient discharges after hours, please call [phone].   Larry Sierras 08/29/2015, 12:06 PM

## 2015-08-30 LAB — TYPE AND SCREEN
ABO/RH(D): O POS
Antibody Screen: NEGATIVE
UNIT DIVISION: 0
Unit division: 0

## 2015-08-30 LAB — CALCIUM, IONIZED: CALCIUM, IONIZED, SERUM: 5 mg/dL (ref 4.5–5.6)

## 2015-09-06 ENCOUNTER — Telehealth: Payer: Self-pay | Admitting: *Deleted

## 2015-09-06 NOTE — Telephone Encounter (Signed)
Patient calling stating she received call from [facility] (Dr. Margart Sickles) that they have not received clearance note from [place] yet.  Informed her that this was already sent as requested.  See below.  [place]   Fax: [phone]   Fax chl chart review car visit sum [phone] Massie Maroon, [place] [phone] 08/29/2015 1:55 PM 08/25/2015    Will fax again at patient request.

## 2015-09-24 ENCOUNTER — Other Ambulatory Visit (HOSPITAL_COMMUNITY)
Admission: RE | Admit: 2015-09-24 | Discharge: 2015-09-24 | Disposition: A | Discharge status: Home / Self Care | Payer: Medicare Other | Source: Other Acute Inpatient Hospital | Attending: General Surgery | Admitting: General Surgery

## 2015-09-24 DIAGNOSIS — Z5181 Encounter for therapeutic drug level monitoring: Secondary | ICD-10-CM | POA: Insufficient documentation

## 2015-09-24 LAB — COMPREHENSIVE METABOLIC PANEL
ALBUMIN: 3 g/dL — AB (ref 3.5–5.0)
ALT: 13 U/L — ABNORMAL LOW (ref 14–54)
AST: 16 U/L (ref 15–41)
Alkaline Phosphatase: 60 U/L (ref 38–126)
Anion gap: 8 (ref 5–15)
BILIRUBIN TOTAL: 0.3 mg/dL (ref 0.3–1.2)
BUN: 35 mg/dL — AB (ref 6–20)
CHLORIDE: 100 mmol/L — AB (ref 101–111)
CO2: 28 mmol/L (ref 22–32)
Calcium: 9 mg/dL (ref 8.9–10.3)
Creatinine, Ser: 1.13 mg/dL — ABNORMAL HIGH (ref 0.44–1.00)
GFR calc Af Amer: 53 mL/min — ABNORMAL LOW (ref >60)
GFR calc non Af Amer: 45 mL/min — ABNORMAL LOW (ref >60)
GLUCOSE: 90 mg/dL (ref 65–99)
POTASSIUM: 3.8 mmol/L (ref 3.5–5.1)
SODIUM: 136 mmol/L (ref 135–145)
TOTAL PROTEIN: 5.5 g/dL — AB (ref 6.5–8.1)

## 2015-09-24 LAB — CBC
HEMATOCRIT: 28.7 % — AB (ref 36.0–46.0)
Hemoglobin: 9 g/dL — ABNORMAL LOW (ref 12.0–15.0)
MCH: 27.1 pg (ref 26.0–34.0)
MCHC: 31.4 g/dL (ref 30.0–36.0)
MCV: 86.4 fL (ref 78.0–100.0)
Platelets: 161 10*3/uL (ref 150–400)
RBC: 3.32 MIL/uL — ABNORMAL LOW (ref 3.87–5.11)
RDW: 16.6 % — AB (ref 11.5–15.5)
WBC: 4.9 10*3/uL (ref 4.0–10.5)

## 2015-09-24 LAB — PHOSPHORUS: PHOSPHORUS: 4.9 mg/dL — AB (ref 2.5–4.6)

## 2015-09-24 LAB — MAGNESIUM: MAGNESIUM: 2 mg/dL (ref 1.7–2.4)

## 2015-10-06 ENCOUNTER — Other Ambulatory Visit (HOSPITAL_COMMUNITY)
Admission: RE | Admit: 2015-10-06 | Discharge: 2015-10-06 | Disposition: A | Discharge status: Home / Self Care | Payer: Medicare Other | Source: Other Acute Inpatient Hospital | Attending: General Surgery | Admitting: General Surgery

## 2015-10-06 DIAGNOSIS — E43 Unspecified severe protein-calorie malnutrition: Secondary | ICD-10-CM | POA: Diagnosis present

## 2015-10-06 LAB — POTASSIUM: POTASSIUM: 4.4 mmol/L (ref 3.5–5.1)

## 2015-10-27 ENCOUNTER — Telehealth: Payer: Self-pay | Admitting: Internal Medicine

## 2015-10-30 ENCOUNTER — Other Ambulatory Visit: Payer: Self-pay

## 2015-10-30 DIAGNOSIS — R1084 Generalized abdominal pain: Secondary | ICD-10-CM

## 2015-10-30 DIAGNOSIS — R110 Nausea: Secondary | ICD-10-CM

## 2015-10-30 NOTE — Telephone Encounter (Signed)
Would recommend acute abdominal series (CXR + flat and upright abd films) -- eval for obstruction APP visit when available To ED if worsening before she can be seen

## 2015-10-30 NOTE — Telephone Encounter (Signed)
Spoke with pt and she states she feels like her stomach has twisted again. States she is not able to eat much of anything. Reports she ate a piece of cheese yesterday and yogurt last night. She is able to drink water but states she is nauseated. Please advise.

## 2015-10-30 NOTE — Telephone Encounter (Signed)
Pt aware, orders in epic. Pt scheduled to see Ellouise Newer PA 10/31/15@2 :15pm. Pt aware of appt.

## 2015-10-31 ENCOUNTER — Encounter (INDEPENDENT_AMBULATORY_CARE_PROVIDER_SITE_OTHER): Payer: Self-pay

## 2015-10-31 ENCOUNTER — Ambulatory Visit (INDEPENDENT_AMBULATORY_CARE_PROVIDER_SITE_OTHER)
Admission: RE | Admit: 2015-10-31 | Discharge: 2015-10-31 | Disposition: A | Discharge status: Home / Self Care | Payer: Medicare Other | Source: Ambulatory Visit | Attending: Internal Medicine | Admitting: Internal Medicine

## 2015-10-31 ENCOUNTER — Encounter: Payer: Self-pay | Admitting: Physician Assistant

## 2015-10-31 ENCOUNTER — Ambulatory Visit (INDEPENDENT_AMBULATORY_CARE_PROVIDER_SITE_OTHER): Payer: Medicare Other | Admitting: Physician Assistant

## 2015-10-31 VITALS — BP 116/42 | HR 60 | Ht 62.0 in | Wt 120.2 lb

## 2015-10-31 DIAGNOSIS — K449 Diaphragmatic hernia without obstruction or gangrene: Secondary | ICD-10-CM | POA: Diagnosis not present

## 2015-10-31 DIAGNOSIS — R110 Nausea: Secondary | ICD-10-CM

## 2015-10-31 DIAGNOSIS — R1013 Epigastric pain: Secondary | ICD-10-CM | POA: Diagnosis not present

## 2015-10-31 DIAGNOSIS — R1084 Generalized abdominal pain: Secondary | ICD-10-CM | POA: Diagnosis not present

## 2015-10-31 NOTE — Progress Notes (Addendum)
Chief Complaint: "I think my stomach has twisted again"  HPI:    [name] is a 81 year old Caucasian female with history of perforated sigmoid diverticulitis complicated by intra-abdominal abscesses and status post Hartman's procedure with end colostomy, ascending colon mass found to be tubulovillous adenoma with recent resection at [facility], history of large hiatal hernia and gastric volvulus with partial reduction and segmental gastrectomy, who typically follows with Dr. Hilarie Fredrickson. The patient presented to the clinic today with a chief complaint of "I think my stomach has twisted again".    Patient did call our clinic yesterday regarding this and an acute abdominal series was ordered by Dr. Hilarie Fredrickson. Results received at time of patient's appointment showed a nonobstructive bowel gas pattern and known large hiatal hernia.     Today, the patient tells me that on Friday she became very nauseous while she was eating and so she stopped. She developed heartburn and was regurgitating saliva. She also noted a pain which radiated into her back. This was very consistent with the previous gastric volvulus that she experienced. The patient tells me that she was in bed on Saturday and Sunday over the weekend and basically "slept for 2 days", she ate only tiny bits of cheese and a Popsicle. The patient tells me that as of last night and this morning she feels much better. She still has not eaten much, but the pain has subsided and she is no longer having the same symptoms as on Friday.     Past medical history is positive for following with [facility] recently for colonoscopy during which a fungating nonobstructing large mass was found in the proximal ascending colon which measured 5 cm in length, this was resected. Plans are for repeat colonoscopy in 6 months per the patient. She also tells me today that they would like to do further surgery on her hiatal hernia to "tack it down", as well as remove her gallbladder  and change her ostomy, but she is "just not ready for this".    Patient denies fever, chills, blood in her stool, melena, change in ostomy output, weight loss, vomiting, heartburn, reflux or continued abdominal pain.   Past Medical History:  Diagnosis Date  . Anemia   . Anxiety state, unspecified   . Atrial fibrillation with RVR ([place])   . [name]'s esophagus   . Cellulitis of extremity   . Congestive heart failure, unspecified    diastolic heart failure  . Coronary artery disease    a. Moderate to severe coronary disease involving the left anterior descending artery and right coronary artery.  Sequential stenosis in the LAD is significant.  Right coronary artery is moderate to severely likely nonischemic. Questionable small LVOT obstruction.  No Brockenbrough maneuver was performed.  Catheterization January 2013; b. 05/2014 MV no isch/infarct, EF 60%.  . Depressive disorder, not elsewhere classified   . Diverticulitis   . Erosive esophagitis 08/04/2015  . Esophageal reflux   . Gastric volvulus 08/04/2015  . Gastritis   . H/O hiatal hernia   . Hypertension   . IBS (irritable bowel syndrome)   . Infection of esophagostomy ([place])   . Macular degeneration   . Melanoma ([place])    removal on back   . Pelvic abscess in female   . Perforated bowel ([place])   . Perforated sigmoid colon ([place])   . PONV (postoperative nausea and vomiting)   . Pulmonary hypertension (HCC)    PA systolic pressure [id number] mmHg by echocardiogram, PA pressure  33/10 by cardiac catheterization PA saturation 62% thermodilution cardiac index 2.0 thick cardiac index 2.4  . Pure hypercholesterolemia   . Tricuspid regurgitation    moderate tricuspid regurgitation by echocardiogram, prior use of anorexic agents    Past Surgical History:  Procedure Laterality Date  . CARDIAC CATHETERIZATION Left 03/05/2015   Procedure: CENTRAL LINE INSERTION;  Surgeon: Aviva Signs, MD;  Location: [facility];  Service: General;  Laterality: Left;  .  CATARACT EXTRACTION W/ INTRAOCULAR LENS  IMPLANT, BILATERAL  ~ 2002  . COLECTOMY WITH COLOSTOMY CREATION/HARTMANN PROCEDURE N/A 03/05/2015   Procedure: PARTIAL COLECTOMY WITH COLOSTOMY CREATION/HARTMANN PROCEDURE;  Surgeon: Aviva Signs, MD;  Location: [facility];  Service: General;  Laterality: N/A;  . CORONARY ANGIOPLASTY WITH STENT PLACEMENT  05/08/11   "1"  . ESOPHAGEAL MANOMETRY N/A 06/06/2014   Procedure: ESOPHAGEAL MANOMETRY (EM);  Surgeon: Jerene Bears, MD;  Location: [facility];  Service: Gastroenterology;  Laterality: N/A;  . ESOPHAGOGASTRODUODENOSCOPY (EGD) WITH PROPOFOL N/A 08/04/2015   Procedure: ESOPHAGOGASTRODUODENOSCOPY (EGD) WITH PROPOFOL;  Surgeon: Mauri Pole, MD;  [facility]: [facility];  Service: Endoscopy;  Laterality: N/A;  . HERNIA REPAIR    . LAPAROSCOPIC NISSEN FUNDOPLICATION N/A [id number]   Procedure: LAPAROSCOPIC  LYSIS OF ADHESIONS, SEGMENTAL GASTRECTOMY, PARTIAL REDUCTION OF HERNIA;  Surgeon: [name], MD;  [facility]: [facility];  Service: General;  Laterality: N/A;  . NISSEN FUNDOPLICATION  [id number]  . PERCUTANEOUS CORONARY STENT INTERVENTION (PCI-S) N/A 05/08/2011   Procedure: PERCUTANEOUS CORONARY STENT INTERVENTION (PCI-S);  Surgeon: Wellington Hampshire, MD;  Location: [facility] CATH LAB;  Service: Cardiovascular;  Laterality: N/A;  . ROTATOR CUFF REPAIR  ~ 2001   right  . SHOULDER ARTHROSCOPY  ~ 2004; 2005   left; "joint's wore out"  . TONSILLECTOMY  ~ 1944  . TUBAL LIGATION  1972    Current Outpatient Prescriptions  Medication Sig Dispense Refill  . albuterol (PROVENTIL HFA;VENTOLIN HFA) 108 (90 Base) MCG/ACT inhaler Inhale 2 puffs into the lungs every 6 (six) hours as needed for wheezing or shortness of breath.     . ALPRAZolam (XANAX) 0.25 MG tablet Take 1 tablet (0.25 mg total) by mouth 2 (two) times daily as needed for anxiety. 60 tablet 0  . citalopram (CELEXA) 40 MG tablet Take 20 mg by mouth at bedtime.     Marland Kitchen diltiazem (CARDIZEM CD) 240 MG 24 hr capsule Take 1  capsule (240 mg total) by mouth daily. 30 capsule 0  . esomeprazole (NEXIUM) 40 MG capsule Take 40 mg by mouth daily before breakfast.     . ferrous sulfate 325 (65 FE) MG EC tablet Take 325 mg by mouth 3 (three) times daily.     . furosemide (LASIX) 20 MG tablet Take 1 tablet (20 mg total) by mouth daily. 30 tablet 0  . hydrALAZINE (APRESOLINE) 25 MG tablet Take 1 tablet (25 mg total) by mouth 2 (two) times daily. 60 tablet 6  . isosorbide mononitrate (IMDUR) 30 MG 24 hr tablet Take 30 mg by mouth daily.    Marland Kitchen loratadine (CLARITIN) 10 MG tablet Take 10 mg by mouth daily.    Marland Kitchen losartan (COZAAR) 100 MG tablet Take 1 tablet (100 mg total) by mouth daily. 30 tablet 6  . metoprolol succinate (TOPROL-XL) 25 MG 24 hr tablet Take 1 tablet (25 mg total) by mouth daily.    . Multiple Vitamins-Minerals (PRESERVISION AREDS) TABS Take 1 tablet by mouth 2 (two) times daily.     . nitroGLYCERIN (NITROSTAT) 0.4 MG SL  tablet Place 0.4 mg under the tongue every 5 (five) minutes as needed for chest pain. Reported on 04/26/2015    . polyethylene glycol powder (GLYCOLAX/MIRALAX) powder MIX 17 GRAMS IN AT LEAST 8OZ OF WATER/JUICE AND DRINK ONCE OR TWICE DAILY    . potassium chloride (K-DUR) 10 MEQ tablet Take 1 tablet (10 mEq total) by mouth daily. 30 tablet 0  . Propylene Glycol (SYSTANE BALANCE OP) Place 1-2 drops into both eyes daily as needed (dry eyes). Reported on 04/26/2015    . rosuvastatin (CRESTOR) 10 MG tablet Take 10 mg by mouth at bedtime.     . traZODone (DESYREL) 100 MG tablet Take 100 mg by mouth at bedtime.     . Vitamin D, Ergocalciferol, (DRISDOL) 50000 UNITS CAPS Take 50,000 Units by mouth every Tuesday.     . zolpidem (AMBIEN) 10 MG tablet Take 10 mg by mouth at bedtime.     No current facility-administered medications for this visit.     Allergies as of 10/31/2015 - Review Complete 10/31/2015  Allergen Reaction Noted  . Sulfamethoxazole Rash 08/05/2006    Family History  Problem Relation  Age of Onset  . Heart attack Father   . Hypertension Father   . Diabetes Mother   . Hypertension Mother   . Skin cancer Brother     melanoma  . Colon cancer Neg Hx   . Esophageal cancer Neg Hx   . Rectal cancer Neg Hx   . Stomach cancer Neg Hx     Social History   Social History  . Marital status: Divorced    Spouse name: N/A  . Number of children: 3  . Years of education: N/A   Occupational History  . Retired    Social History Main Topics  . Smoking status: Former Smoker    Packs/day: 1.00    Years: 20.00    Types: Cigarettes    Start date: 10/12/1958    Quit date: 04/26/1988  . Smokeless tobacco: Never Used  . Alcohol use No  . Drug use: No  . Sexual activity: No   Other Topics Concern  . Not on file   Social History Narrative   Divorced.  Lives alone.  Ambulates with a walker.     Review of Systems:    Constitutional: Positive for fatigue No fever or chills HEENT: Eyes: No change in vision               Ears, Nose, Throat:  No change in hearing Skin: No rash or itching Cardiovascular: No chest pain, chest pressure or palpitations   Respiratory: No SOB or cough Gastrointestinal: See HPI and otherwise negative Genitourinary: No dysuria or change in urinary frequency Neurological: No headache, dizziness or syncope Musculoskeletal: No new muscle or back pain Hematologic: No bleeding Psychiatric: No history of depression or anxiety    Physical Exam:  Vital signs: BP (!) 116/42   Pulse 60   Ht 5\' 2"  (1.575 m)   Wt 120 lb 4 oz (54.5 kg)   BMI 21.99 kg/m   General:   Pleasant Elderly Caucasian female appears to be in NAD, Well developed, Well nourished, alert and cooperative Head:  Normocephalic and atraumatic. Eyes:   PEERL, EOMI. No icterus. Conjunctiva pink. Ears:  Normal auditory acuity. Neck:  Supple Throat: Oral cavity and pharynx without inflammation, swelling or lesion.  Lungs: Respirations even and unlabored. Lungs clear to auscultation  bilaterally.   No wheezes, crackles, or rhonchi.  Heart: Normal S1, S2.  No MRG. Regular rate and rhythm. No peripheral edema, cyanosis or pallor.  Abdomen:  Soft, nondistended, nontender. No rebound or guarding. Normal bowel sounds. No appreciable masses or hepatomegaly. Ostomy present in left lower quadrant Rectal:  Not performed.  Msk:  Symmetrical without gross deformities. Peripheral pulses intact.  Extremities:  Without edema, no deformity or joint abnormality.  Neurologic:  Alert and  oriented x4;  grossly normal neurologically.  Skin:   Dry and intact without significant lesions or rashes. Psychiatric: Oriented to person, place and time. Demonstrates good judgement and reason without abnormal affect or behaviors.  RELEVANT LABS AND IMAGING: CLINICAL DATA:  Nausea and abdominal discomfort.  EXAM: ABDOMEN - 2 VIEW  COMPARISON:  Abdominal CT 08/03/2015  FINDINGS: Large hiatal hernia that is known. Chronic small right pleural effusion or scarring. Nonobstructive bowel gas pattern. No pneumoperitoneum. Clips correlating with history of Nissen and colectomy. Amorphous density over the right upper quadrant does not correlate with calculi recent abdominal CT and may be chondral calcification. The right liver tip chronically extends into the right iliac fossa. Degenerative lumbar dextroscoliosis. Remote right lower rib fractures.  IMPRESSION: 1. Nonobstructive bowel gas pattern. 2. Known large hiatal hernia.   Electronically Signed   By: Monte Fantasia M.D.   On: 10/31/2015 13:45  Assessment: 1. Epigastric abdominal pain: Patient reports abrupt onset of nausea and vomiting as well as inability to handle her secretions associated with an epigastric pain which radiated into her back on Friday, 10/27/15, patient has remained on a very restricted diet and rested over the weekend and symptoms are resolving; abdominal series as above with nonobstructive bowel gas pattern and known  large hiatal hernia; suspect that patient had repeat gastric volvulus which has since corrected over the weekend 2. Dyspepsia: See above  Plan: 1. Discussed with patient that this could have represented a twisting in her stomach which resolved itself over the weekend. We did spend some time discussing [facility] surgery's recommendations for further correction of her hiatal hernia. I explained that it would be better to do this surgery in a nonemergent situation. Patient verbalized understanding but tells me that she is just "not quite ready". Recommend that she follow with [facility] regarding her recent episode and see about following up with them and possibly scheduling surgery. 2. Patient to remain on restricted diet, with gradual increase back to regular over the next few days. 3. Patient to return to clinic as needed in the future.  Ellouise Newer, PA-C [facility] 10/31/2015, 2:29 PM  Cc: Terald Sleeper, PA   Addendum: Reviewed and agree with management. Pt symptoms recur as discussed above she needs to be seen in the ED as large hiatal hernias carry risk of obstruction/volvulus. I also see her large ascending colon polyp/mass was partially removed by colonoscopy at [facility].  Short interval colonoscopy is recommend there to complete resection. Jerene Bears, MD

## 2015-11-22 ENCOUNTER — Ambulatory Visit: Payer: Medicare Other | Admitting: Family Medicine

## 2015-12-11 ENCOUNTER — Ambulatory Visit: Payer: Medicare Other | Admitting: Cardiovascular Disease

## 2015-12-18 ENCOUNTER — Encounter: Payer: Self-pay | Admitting: Physician Assistant

## 2015-12-18 ENCOUNTER — Ambulatory Visit (INDEPENDENT_AMBULATORY_CARE_PROVIDER_SITE_OTHER): Payer: Medicare Other | Admitting: Physician Assistant

## 2015-12-18 VITALS — BP 153/63 | HR 45 | Temp 96.6°F | Ht 62.0 in | Wt 126.6 lb

## 2015-12-18 DIAGNOSIS — K311 Adult hypertrophic pyloric stenosis: Secondary | ICD-10-CM | POA: Diagnosis not present

## 2015-12-18 DIAGNOSIS — D500 Iron deficiency anemia secondary to blood loss (chronic): Secondary | ICD-10-CM

## 2015-12-18 DIAGNOSIS — K2210 Ulcer of esophagus without bleeding: Secondary | ICD-10-CM

## 2015-12-18 DIAGNOSIS — Z8719 Personal history of other diseases of the digestive system: Secondary | ICD-10-CM

## 2015-12-18 DIAGNOSIS — I4891 Unspecified atrial fibrillation: Secondary | ICD-10-CM | POA: Diagnosis not present

## 2015-12-18 DIAGNOSIS — Z23 Encounter for immunization: Secondary | ICD-10-CM

## 2015-12-18 DIAGNOSIS — K208 Other esophagitis: Secondary | ICD-10-CM | POA: Diagnosis not present

## 2015-12-18 DIAGNOSIS — K440 Diaphragmatic hernia with obstruction, without gangrene: Secondary | ICD-10-CM

## 2015-12-18 DIAGNOSIS — N183 Chronic kidney disease, stage 3 unspecified: Secondary | ICD-10-CM

## 2015-12-18 DIAGNOSIS — I10 Essential (primary) hypertension: Secondary | ICD-10-CM | POA: Diagnosis not present

## 2015-12-18 DIAGNOSIS — I480 Paroxysmal atrial fibrillation: Secondary | ICD-10-CM

## 2015-12-18 DIAGNOSIS — M533 Sacrococcygeal disorders, not elsewhere classified: Secondary | ICD-10-CM | POA: Diagnosis not present

## 2015-12-18 MED ORDER — METHYLPREDNISOLONE ACETATE 80 MG/ML IJ SUSP
80.0000 mg | Freq: Once | INTRAMUSCULAR | Status: AC
  Administered 2015-12-18: 80 mg via INTRAMUSCULAR

## 2015-12-18 MED ORDER — PREDNISONE 10 MG PO TABS: 10.0000 mg | ORAL_TABLET | Freq: Every day | ORAL | 2 refills | Status: DC

## 2015-12-18 NOTE — Patient Instructions (Signed)

## 2015-12-18 NOTE — Progress Notes (Signed)
BP (!) 153/63 (BP Location: Right Arm, Patient Position: Sitting, Cuff Size: Normal)   Pulse (!) 45   Temp (!) 96.6 F (35.9 C) (Oral)   Ht 5' 2"  (1.575 m)   Wt 126 lb 9.6 oz (57.4 kg)   BMI 23.16 kg/m    Subjective:    Patient ID: Cathie I Fang, female    DOB: 1934/07/27, 81 y.o.   MRN: [id number]  Nadeen Shipman Hoch is a 81 y.o. female presenting on 12/18/2015 for Follow-up  HPI Patient here to be established as new patient at [facility].  This patient is known to me from [facility]. This patient comes in with multiple medical problems and review of her medications. She states that she does not need any refills at this time. The most pressing complaint is some lower back pain that is central and radiates out onto the tops of her hips. She states that she has not had any injury to her low back before. She had is a patient at [facility] for her shoulder. She has never seen anyone for her back.  Due to her reflux and hiatal hernia she is not able to take typical anti-inflammatories. Tylenol at this time is not helping any. She has taken prednisone before and tolerated it well.  Past medical history is positive for essential hypertension, paroxysmal atrial fibrillation, etc. fibrillation with RVR, and irreducible hiatal hernia, gastric outlet obstruction and recurrent volvulus, erosive esophagitis history, chronic kidney disease stage III, anemia due to chronic blood loss, rotator cuff disorder, bowel resection and colostomy.   Relevant past medical, surgical, family and social history reviewed and updated as indicated. Interim medical history since our last visit reviewed. Allergies and medications reviewed and updated.   Data reviewed from any sources in EPIC.  Review of Systems  Constitutional: Negative.  Negative for activity change, fatigue and fever.  HENT: Negative.   Eyes: Negative.   Respiratory: Negative.  Negative for cough.     Cardiovascular: Positive for palpitations. Negative for chest pain.  Gastrointestinal: Positive for abdominal distention. Negative for abdominal pain, blood in stool, diarrhea, nausea and vomiting.  Endocrine: Negative.   Genitourinary: Negative.  Negative for dysuria.  Musculoskeletal: Positive for back pain and myalgias.  Skin: Negative.   Neurological: Negative.     Per HPI unless specifically indicated above  Social History   Social History  . Marital status: Divorced    Spouse name: N/A  . Number of children: 3  . Years of education: N/A   Occupational History  . Retired    Social History Main Topics  . Smoking status: Former Smoker    Packs/day: 1.00    Years: 20.00    Types: Cigarettes    Start date: 10/12/1958    Quit date: 04/26/1988  . Smokeless tobacco: Never Used  . Alcohol use No  . Drug use: No  . Sexual activity: No   Other Topics Concern  . Not on file   Social History Narrative   Divorced.  Lives alone.  Ambulates with a walker.     Past Surgical History:  Procedure Laterality Date  . CARDIAC CATHETERIZATION Left 03/05/2015   Procedure: CENTRAL LINE INSERTION;  Surgeon: Aviva Signs, MD;  Location: [facility];  Service: General;  Laterality: Left;  . CATARACT EXTRACTION W/ INTRAOCULAR LENS  IMPLANT, BILATERAL  ~ 2002  . COLECTOMY WITH COLOSTOMY CREATION/HARTMANN PROCEDURE N/A 03/05/2015   Procedure: PARTIAL COLECTOMY WITH COLOSTOMY CREATION/HARTMANN PROCEDURE;  Surgeon:  Aviva Signs, MD;  Location: [facility];  Service: General;  Laterality: N/A;  . CORONARY ANGIOPLASTY WITH STENT PLACEMENT  05/08/11   "1"  . ESOPHAGEAL MANOMETRY N/A 06/06/2014   Procedure: ESOPHAGEAL MANOMETRY (EM);  Surgeon: Jerene Bears, MD;  [facility];  Service: Gastroenterology;  Laterality: N/A;  . ESOPHAGOGASTRODUODENOSCOPY (EGD) WITH PROPOFOL N/A 08/04/2015   Procedure: ESOPHAGOGASTRODUODENOSCOPY (EGD) WITH PROPOFOL;  Surgeon: Mauri Pole, MD;  [facility];   Service: Endoscopy;  Laterality: N/A;  . HERNIA REPAIR    . LAPAROSCOPIC NISSEN FUNDOPLICATION N/A 5/85/2778   Procedure: LAPAROSCOPIC  LYSIS OF ADHESIONS, SEGMENTAL GASTRECTOMY, PARTIAL REDUCTION OF HERNIA;  Surgeon: [name], MD;  Location: [facility];  Service: General;  Laterality: N/A;  . NISSEN FUNDOPLICATION  2423  . PERCUTANEOUS CORONARY STENT INTERVENTION (PCI-S) N/A 05/08/2011   Procedure: PERCUTANEOUS CORONARY STENT INTERVENTION (PCI-S);  Surgeon: Wellington Hampshire, MD;  Location: [facility] CATH LAB;  Service: Cardiovascular;  Laterality: N/A;  . ROTATOR CUFF REPAIR  ~ 2001   right  . SHOULDER ARTHROSCOPY  ~ 2004; 2005   left; "joint's wore out"  . TONSILLECTOMY  ~ 1944  . TUBAL LIGATION  1972    Family History  Problem Relation Age of Onset  . Heart attack Father   . Hypertension Father   . Diabetes Mother   . Hypertension Mother   . Skin cancer Brother     melanoma  . Colon cancer Neg Hx   . Esophageal cancer Neg Hx   . Rectal cancer Neg Hx   . Stomach cancer Neg Hx       Medication List       Accurate as of 12/18/15  9:21 AM. Always use your most recent med list.          albuterol 108 (90 Base) MCG/ACT inhaler Commonly known as:  PROVENTIL HFA;VENTOLIN HFA Inhale 2 puffs into the lungs every 6 (six) hours as needed for wheezing or shortness of breath.   ALPRAZolam 0.25 MG tablet Commonly known as:  XANAX Take 1 tablet (0.25 mg total) by mouth 2 (two) times daily as needed for anxiety.   citalopram 40 MG tablet Commonly known as:  CELEXA Take 20 mg by mouth at bedtime.   diltiazem 240 MG 24 hr capsule Commonly known as:  CARDIZEM CD Take 1 capsule (240 mg total) by mouth daily.   esomeprazole 40 MG capsule Commonly known as:  NEXIUM Take 40 mg by mouth daily before breakfast.   ferrous sulfate 325 (65 FE) MG EC tablet Take 325 mg by mouth 3 (three) times daily.   furosemide 20 MG tablet Commonly known as:  LASIX Take 1 tablet (20 mg total) by mouth  daily.   hydrALAZINE 25 MG tablet Commonly known as:  APRESOLINE Take 1 tablet (25 mg total) by mouth 2 (two) times daily.   isosorbide mononitrate 30 MG 24 hr tablet Commonly known as:  IMDUR Take 30 mg by mouth daily.   loratadine 10 MG tablet Commonly known as:  CLARITIN Take 10 mg by mouth daily.   losartan 100 MG tablet Commonly known as:  COZAAR Take 1 tablet (100 mg total) by mouth daily.   metoprolol succinate 25 MG 24 hr tablet Commonly known as:  TOPROL-XL Take 1 tablet (25 mg total) by mouth daily.   nitroGLYCERIN 0.4 MG SL tablet Commonly known as:  NITROSTAT Place 0.4 mg under the tongue every 5 (five) minutes as needed for chest pain. Reported on  04/26/2015   polyethylene glycol powder powder Commonly known as:  GLYCOLAX/MIRALAX MIX 17 GRAMS IN AT LEAST 8OZ OF WATER/JUICE AND DRINK ONCE OR TWICE DAILY   potassium chloride 10 MEQ tablet Commonly known as:  K-DUR Take 1 tablet (10 mEq total) by mouth daily.   predniSONE 10 MG tablet Commonly known as:  DELTASONE Take 1 tablet (10 mg total) by mouth daily with breakfast. Take 10 days, then 1/2 tablet 10 days, then every other other day.   PRESERVISION AREDS Tabs Take 1 tablet by mouth 2 (two) times daily.   rosuvastatin 10 MG tablet Commonly known as:  CRESTOR Take 10 mg by mouth at bedtime.   traZODone 100 MG tablet Commonly known as:  DESYREL Take 100 mg by mouth at bedtime.   Vitamin D (Ergocalciferol) 50000 units Caps capsule Commonly known as:  DRISDOL Take 50,000 Units by mouth every Tuesday.   zolpidem 10 MG tablet Commonly known as:  AMBIEN Take 10 mg by mouth at bedtime.          Objective:    BP (!) 153/63 (BP Location: Right Arm, Patient Position: Sitting, Cuff Size: Normal)   Pulse (!) 45   Temp (!) 96.6 F (35.9 C) (Oral)   Ht 5' 2"  (1.575 m)   Wt 126 lb 9.6 oz (57.4 kg)   BMI 23.16 kg/m   Allergies  Allergen Reactions  . Sulfamethoxazole Rash   Wt Readings from Last 3  Encounters:  12/18/15 126 lb 9.6 oz (57.4 kg)  10/31/15 120 lb 4 oz (54.5 kg)  08/28/15 122 lb 3.2 oz (55.4 kg)    Physical Exam  Constitutional: She is oriented to person, place, and time. She appears well-developed and well-nourished.  HENT:  Head: Normocephalic and atraumatic.  Eyes: Conjunctivae and EOM are normal. Pupils are equal, round, and reactive to light.  Neck: Normal range of motion. Neck supple.  Cardiovascular: Normal rate, regular rhythm, normal heart sounds and intact distal pulses.   Pulmonary/Chest: Effort normal and breath sounds normal.  Abdominal: Soft. Bowel sounds are normal.  Neurological: She is alert and oriented to person, place, and time. She has normal reflexes.  Skin: Skin is warm and dry. No rash noted.  Psychiatric: She has a normal mood and affect. Her behavior is normal. Judgment and thought content normal.  Nursing note and vitals reviewed.   Results for orders placed or performed during the hospital encounter of 10/06/15  Potassium  Result Value Ref Range   Potassium 4.4 3.5 - 5.1 mmol/L      Assessment & Plan:   1. Essential hypertension Continue furosemide 20 mg 1 daily, metoprolol 25 mg 1 daily, losartan 100 mg 1 daily, hydralazine 25 mg 2 times daily and possibly diltiazem 240 mg 1 daily. There is some question that this had not been refilled since her May appointment with the specialist. For now she will hold it and we will check with pharmacy.  2. PAF (paroxysmal atrial fibrillation) ([place]) Continue care with cardiologist, and continue metoprolol as noted above  3. Atrial fibrillation with RVR ([place]) See above  4. Irreducible hiatal hernia Continue to watch diet and positioning after eating  5. Gastric outlet obstruction Call if any symptoms recur  6. Erosive esophagitis Avoid NSAIDs and over-the-counter aspirin products  7. CKD (chronic kidney disease) stage 3, GFR 30-59 ml/min Follow as needed, labs are drawn today -  CMP14+EGFR - Microalbumin / creatinine urine ratio  8. Anemia due to chronic blood loss Recheck  labs today - CBC with Differential/Platelet  9. Sacro ilial pain Heat for 15 minutes and gentle stretching - predniSONE (DELTASONE) 10 MG tablet; Take 1 tablet (10 mg total) by mouth daily with breakfast. Take 10 days, then 1/2 tablet 10 days, then every other other day.  Dispense: 30 tablet; Refill: 2 - methylPREDNISolone acetate (DEPO-MEDROL) injection 80 mg; Inject 1 mL (80 mg total) into the muscle once.  10. History of GI bleed Emergency room if any recurrent problem   Continue all other maintenance medications as listed above. Educational handout given for back stretches  Follow up plan: Return in about 4 weeks (around 01/15/2016) for recheck.  Terald Sleeper PA-C [facility] [address]  [place], [place] [address] [phone]   12/18/2015, 9:21 AM

## 2015-12-19 LAB — CBC WITH DIFFERENTIAL/PLATELET
BASOS ABS: 0 10*3/uL (ref 0.0–0.2)
Basos: 0 %
EOS (ABSOLUTE): 0.2 10*3/uL (ref 0.0–0.4)
Eos: 2 %
HEMOGLOBIN: 9.8 g/dL — AB (ref 11.1–15.9)
Hematocrit: 32.4 % — ABNORMAL LOW (ref 34.0–46.6)
IMMATURE GRANS (ABS): 0 10*3/uL (ref 0.0–0.1)
Immature Granulocytes: 0 %
LYMPHS: 21 %
Lymphocytes Absolute: 1.4 10*3/uL (ref 0.7–3.1)
MCH: 24.3 pg — ABNORMAL LOW (ref 26.6–33.0)
MCHC: 30.2 g/dL — AB (ref 31.5–35.7)
MCV: 80 fL (ref 79–97)
MONOCYTES: 6 %
Monocytes Absolute: 0.4 10*3/uL (ref 0.1–0.9)
Neutrophils Absolute: 4.7 10*3/uL (ref 1.4–7.0)
Neutrophils: 71 %
PLATELETS: 273 10*3/uL (ref 150–379)
RBC: 4.04 x10E6/uL (ref 3.77–5.28)
RDW: 18.1 % — ABNORMAL HIGH (ref 12.3–15.4)
WBC: 6.7 10*3/uL (ref 3.4–10.8)

## 2015-12-19 LAB — CMP14+EGFR
ALBUMIN: 3.2 g/dL — AB (ref 3.5–4.8)
ALK PHOS: 90 IU/L (ref 39–117)
ALT: 6 IU/L (ref 0–32)
AST: 8 IU/L (ref 0–40)
Albumin/Globulin Ratio: 1.1 — ABNORMAL LOW (ref 1.2–2.2)
BUN / CREAT RATIO: 13 (ref 12–28)
BUN: 16 mg/dL (ref 8–27)
Bilirubin Total: <0.2 mg/dL (ref 0.0–1.2)
CO2: 27 mmol/L (ref 18–29)
CREATININE: 1.26 mg/dL — AB (ref 0.57–1.00)
Calcium: 8.9 mg/dL (ref 8.7–10.3)
Chloride: 101 mmol/L (ref 96–106)
GFR calc Af Amer: 47 mL/min/{1.73_m2} — ABNORMAL LOW (ref >59)
GFR calc non Af Amer: 41 mL/min/{1.73_m2} — ABNORMAL LOW (ref >59)
GLUCOSE: 93 mg/dL (ref 65–99)
Globulin, Total: 2.9 g/dL (ref 1.5–4.5)
Potassium: 4.3 mmol/L (ref 3.5–5.2)
Sodium: 142 mmol/L (ref 134–144)
TOTAL PROTEIN: 6.1 g/dL (ref 6.0–8.5)

## 2015-12-19 LAB — MICROALBUMIN / CREATININE URINE RATIO
CREATININE, UR: 287.3 mg/dL
MICROALB/CREAT RATIO: 26.8 mg/g{creat} (ref 0.0–30.0)
Microalbumin, Urine: 76.9 ug/mL

## 2016-01-04 ENCOUNTER — Encounter: Payer: Self-pay | Admitting: Cardiovascular Disease

## 2016-01-04 ENCOUNTER — Ambulatory Visit (INDEPENDENT_AMBULATORY_CARE_PROVIDER_SITE_OTHER): Payer: Medicare Other | Admitting: Cardiovascular Disease

## 2016-01-04 VITALS — BP 128/74 | HR 58 | Ht 62.0 in | Wt 129.0 lb

## 2016-01-04 DIAGNOSIS — I2510 Atherosclerotic heart disease of native coronary artery without angina pectoris: Secondary | ICD-10-CM

## 2016-01-04 DIAGNOSIS — I5032 Chronic diastolic (congestive) heart failure: Secondary | ICD-10-CM

## 2016-01-04 DIAGNOSIS — I10 Essential (primary) hypertension: Secondary | ICD-10-CM | POA: Diagnosis not present

## 2016-01-04 DIAGNOSIS — R001 Bradycardia, unspecified: Secondary | ICD-10-CM | POA: Diagnosis not present

## 2016-01-04 DIAGNOSIS — I480 Paroxysmal atrial fibrillation: Secondary | ICD-10-CM | POA: Diagnosis not present

## 2016-01-04 DIAGNOSIS — Z9861 Coronary angioplasty status: Secondary | ICD-10-CM

## 2016-01-04 DIAGNOSIS — Z9889 Other specified postprocedural states: Secondary | ICD-10-CM

## 2016-01-04 NOTE — Patient Instructions (Signed)

## 2016-01-04 NOTE — Progress Notes (Signed)
SUBJECTIVE: The patient presents for follow-up of coronary artery disease, paroxysmal atrial fibrillation, and chronic diastolic heart failure.  She is feeling well and denies chest pain, SOB, leg swelling, and palpitations. She cancelled her surgery for hiatal hernia repair.   Review of Systems: As per "subjective", otherwise negative.  Allergies  Allergen Reactions  . Sulfamethoxazole Rash    Current Outpatient Prescriptions  Medication Sig Dispense Refill  . albuterol (PROVENTIL HFA;VENTOLIN HFA) 108 (90 Base) MCG/ACT inhaler Inhale 2 puffs into the lungs every 6 (six) hours as needed for wheezing or shortness of breath.     . ALPRAZolam (XANAX) 0.25 MG tablet Take 1 tablet (0.25 mg total) by mouth 2 (two) times daily as needed for anxiety. 60 tablet 0  . citalopram (CELEXA) 40 MG tablet Take 20 mg by mouth at bedtime.     Marland Kitchen diltiazem (CARDIZEM CD) 240 MG 24 hr capsule Take 1 capsule (240 mg total) by mouth daily. 30 capsule 0  . esomeprazole (NEXIUM) 40 MG capsule Take 40 mg by mouth daily before breakfast.     . ferrous sulfate 325 (65 FE) MG EC tablet Take 325 mg by mouth 3 (three) times daily.     . furosemide (LASIX) 20 MG tablet Take 1 tablet (20 mg total) by mouth daily. 30 tablet 0  . hydrALAZINE (APRESOLINE) 25 MG tablet Take 1 tablet (25 mg total) by mouth 2 (two) times daily. 60 tablet 6  . isosorbide mononitrate (IMDUR) 30 MG 24 hr tablet Take 30 mg by mouth daily.    Marland Kitchen loratadine (CLARITIN) 10 MG tablet Take 10 mg by mouth daily.    Marland Kitchen losartan (COZAAR) 100 MG tablet Take 1 tablet (100 mg total) by mouth daily. 30 tablet 6  . metoprolol succinate (TOPROL-XL) 25 MG 24 hr tablet Take 1 tablet (25 mg total) by mouth daily.    . Multiple Vitamins-Minerals (PRESERVISION AREDS) TABS Take 1 tablet by mouth 2 (two) times daily.     . nitroGLYCERIN (NITROSTAT) 0.4 MG SL tablet Place 0.4 mg under the tongue every 5 (five) minutes as needed for chest pain. Reported on  04/26/2015    . polyethylene glycol powder (GLYCOLAX/MIRALAX) powder MIX 17 GRAMS IN AT LEAST 8OZ OF WATER/JUICE AND DRINK ONCE OR TWICE DAILY    . potassium chloride (K-DUR) 10 MEQ tablet Take 1 tablet (10 mEq total) by mouth daily. 30 tablet 0  . predniSONE (DELTASONE) 10 MG tablet Take 1 tablet (10 mg total) by mouth daily with breakfast. Take 10 days, then 1/2 tablet 10 days, then every other other day. 30 tablet 2  . rosuvastatin (CRESTOR) 10 MG tablet Take 10 mg by mouth at bedtime.     . traZODone (DESYREL) 100 MG tablet Take 100 mg by mouth at bedtime.     . Vitamin D, Ergocalciferol, (DRISDOL) 50000 UNITS CAPS Take 50,000 Units by mouth every Tuesday.     . zolpidem (AMBIEN) 10 MG tablet Take 10 mg by mouth at bedtime.     No current facility-administered medications for this visit.     Past Medical History:  Diagnosis Date  . Anemia   . Anxiety state, unspecified   . Atrial fibrillation with RVR ([place])   . [name]'s esophagus   . Cellulitis of extremity   . Congestive heart failure, unspecified    diastolic heart failure  . Coronary artery disease    a. Moderate to severe coronary disease involving the left anterior descending artery  and right coronary artery.  Sequential stenosis in the LAD is significant.  Right coronary artery is moderate to severely likely nonischemic. Questionable small LVOT obstruction.  No Brockenbrough maneuver was performed.  Catheterization January 2013; b. 05/2014 MV no isch/infarct, EF 60%.  . Depressive disorder, not elsewhere classified   . Diverticulitis   . Erosive esophagitis 08/04/2015  . Esophageal reflux   . Gastric volvulus 08/04/2015  . Gastritis   . H/O hiatal hernia   . Hypertension   . IBS (irritable bowel syndrome)   . Infection of esophagostomy ([place])   . Macular degeneration   . Melanoma ([place])    removal on back   . Pelvic abscess in female   . Perforated bowel ([place])   . Perforated sigmoid colon ([place])   . PONV (postoperative nausea  and vomiting)   . Pulmonary hypertension    PA systolic pressure [id number] mmHg by echocardiogram, PA pressure 33/10 by cardiac catheterization PA saturation 62% thermodilution cardiac index 2.0 thick cardiac index 2.4  . Pure hypercholesterolemia   . Tricuspid regurgitation    moderate tricuspid regurgitation by echocardiogram, prior use of anorexic agents    Past Surgical History:  Procedure Laterality Date  . CARDIAC CATHETERIZATION Left 03/05/2015   Procedure: CENTRAL LINE INSERTION;  Surgeon: Aviva Signs, MD;  Location: [facility];  Service: General;  Laterality: Left;  . CATARACT EXTRACTION W/ INTRAOCULAR LENS  IMPLANT, BILATERAL  ~ 2002  . COLECTOMY WITH COLOSTOMY CREATION/HARTMANN PROCEDURE N/A 03/05/2015   Procedure: PARTIAL COLECTOMY WITH COLOSTOMY CREATION/HARTMANN PROCEDURE;  Surgeon: Aviva Signs, MD;  Location: [facility];  Service: General;  Laterality: N/A;  . CORONARY ANGIOPLASTY WITH STENT PLACEMENT  05/08/11   "1"  . ESOPHAGEAL MANOMETRY N/A 06/06/2014   Procedure: ESOPHAGEAL MANOMETRY (EM);  Surgeon: Jerene Bears, MD;  Location: [facility];  Service: Gastroenterology;  Laterality: N/A;  . ESOPHAGOGASTRODUODENOSCOPY (EGD) WITH PROPOFOL N/A 08/04/2015   Procedure: ESOPHAGOGASTRODUODENOSCOPY (EGD) WITH PROPOFOL;  Surgeon: Mauri Pole, MD;  [facility]: [facility];  Service: Endoscopy;  Laterality: N/A;  . HERNIA REPAIR    . LAPAROSCOPIC NISSEN FUNDOPLICATION N/A [id number]   Procedure: LAPAROSCOPIC  LYSIS OF ADHESIONS, SEGMENTAL GASTRECTOMY, PARTIAL REDUCTION OF HERNIA;  Surgeon: [name], MD;  Location: [facility];  Service: General;  Laterality: N/A;  . NISSEN FUNDOPLICATION  [id number]  . PERCUTANEOUS CORONARY STENT INTERVENTION (PCI-S) N/A 05/08/2011   Procedure: PERCUTANEOUS CORONARY STENT INTERVENTION (PCI-S);  Surgeon: Wellington Hampshire, MD;  Location: [facility] CATH LAB;  Service: Cardiovascular;  Laterality: N/A;  . ROTATOR CUFF REPAIR  ~ 2001   right  . SHOULDER ARTHROSCOPY  ~ 2004;  2005   left; "joint's wore out"  . TONSILLECTOMY  ~ 1944  . TUBAL LIGATION  1972    Social History   Social History  . Marital status: Divorced    Spouse name: N/A  . Number of children: 3  . Years of education: N/A   Occupational History  . Retired    Social History Main Topics  . Smoking status: Former Smoker    Packs/day: 1.00    Years: 20.00    Types: Cigarettes    Start date: 10/12/1958    Quit date: 04/26/1988  . Smokeless tobacco: Never Used  . Alcohol use No  . Drug use: No  . Sexual activity: No   Other Topics Concern  . Not on file   Social History Narrative   Divorced.  Lives alone.  Ambulates with a walker.  Vitals:   01/04/16 1313  BP: 128/74  Pulse: (!) 58  SpO2: 96%  Weight: 129 lb (58.5 kg)  Height: 5\' 2"  (1.575 m)    PHYSICAL EXAM General: NAD HEENT: Normal. Neck: No JVD, no thyromegaly. Lungs: Clear to auscultation bilaterally with normal respiratory effort. CV: Nondisplaced PMI.  Regular rate and rhythm, normal S1/S2, no S3/S4, no murmur. No pretibial or periankle edema.  No carotid bruit.   Abdomen: Soft, nontender, no distention.  Neurologic: Alert and oriented.  Psych: Normal affect. Skin: Normal. Musculoskeletal: No gross deformities.    ECG: Most recent ECG reviewed.      ASSESSMENT AND PLAN: 1. CAD s/p LAD stent: Stable ischemic heart disease. Normal nuclear MPI study 05/08/14. Not on ASA due to high bleeding risk and anemia. Will continue metoprolol, Crestor, and Imdur.  2. Essential HTN: Controlled. No changes.  3. Symptomatic bradycardia/Paroxysmal atrial fibrillation: Stable at present on metoprolol. No longer on anticoagulation due to high bleeding risk, anemia, and falls risk. She was evaluated by Dr. Rayann Heman who did not feel she is currently a candidate for the Watchman device, although this could be considered in the future. See his note for his recommendations.  4. Chronic diastolic heart failure:  Symptomatically stable on current dose of Lasix.   Dispo: f/u 6 months.   Kate Sable, M.D., F.A.C.C.

## 2016-01-15 ENCOUNTER — Encounter: Payer: Self-pay | Admitting: Physician Assistant

## 2016-01-15 ENCOUNTER — Other Ambulatory Visit: Payer: Self-pay | Admitting: Physician Assistant

## 2016-01-15 ENCOUNTER — Ambulatory Visit (INDEPENDENT_AMBULATORY_CARE_PROVIDER_SITE_OTHER): Payer: Medicare Other | Admitting: Physician Assistant

## 2016-01-15 ENCOUNTER — Ambulatory Visit (INDEPENDENT_AMBULATORY_CARE_PROVIDER_SITE_OTHER): Payer: Medicare Other

## 2016-01-15 VITALS — BP 159/69 | HR 52 | Temp 97.0°F | Ht 62.0 in | Wt 129.8 lb

## 2016-01-15 DIAGNOSIS — M5441 Lumbago with sciatica, right side: Secondary | ICD-10-CM

## 2016-01-15 DIAGNOSIS — G8929 Other chronic pain: Secondary | ICD-10-CM

## 2016-01-15 DIAGNOSIS — I480 Paroxysmal atrial fibrillation: Secondary | ICD-10-CM

## 2016-01-15 DIAGNOSIS — M5440 Lumbago with sciatica, unspecified side: Secondary | ICD-10-CM

## 2016-01-15 DIAGNOSIS — K3189 Other diseases of stomach and duodenum: Secondary | ICD-10-CM | POA: Diagnosis not present

## 2016-01-15 DIAGNOSIS — M5442 Lumbago with sciatica, left side: Secondary | ICD-10-CM

## 2016-01-15 DIAGNOSIS — M5136 Other intervertebral disc degeneration, lumbar region: Secondary | ICD-10-CM

## 2016-01-15 DIAGNOSIS — I10 Essential (primary) hypertension: Secondary | ICD-10-CM | POA: Diagnosis not present

## 2016-01-15 MED ORDER — ACETAMINOPHEN-CODEINE #3 300-30 MG PO TABS: 1.0000 | ORAL_TABLET | Freq: Four times a day (QID) | ORAL | 1 refills | Status: DC | PRN

## 2016-01-15 NOTE — Progress Notes (Addendum)
BP (!) 190/74   Pulse (!) 53   Temp 97 F (36.1 C) (Oral)   Ht 5' 2"  (1.575 m)   Wt 129 lb 12.8 oz (58.9 kg)   BMI 23.74 kg/m    Subjective:    Patient ID: [name], female    DOB: [birth year], 81 y.o.   MRN: [id number]  HPI: [name] is a 81 y.o. female presenting on 01/15/2016 for Sciatica; 4 week follow up ; and Hypertension  She has had greater than 2 months sciatica and lumbar pain. Approximately one month ago she came to the office and was given a Depo-Medrol injection. She cannot take NSAIDs due to her severe GI conditions and colostomy. She has been taking to have to 5 mg of prednisone daily and has had no improvement in her lumbar spine. No radiology has been performed on this. She is established with [facility] orthopedics. We will perform x-[place] today and more than likely have referral to them. While she was here today her blood pressure is quite elevated when she came in. She states she is in a 10 out of 10 pain. She does not report any falls or leg weakness. Second blood pressure check does have a blood pressure reduced.  Relevant past medical, surgical, family and social history reviewed and updated as indicated. Interim medical history since our last visit reviewed. Allergies and medications reviewed and updated. DATA REVIEWED: CHART IN EPIC  Social History   Social History  . Marital status: Divorced    Spouse name: N/A  . Number of children: 3  . Years of education: N/A   Occupational History  . Retired    Social History Main Topics  . Smoking status: Former Smoker    Packs/day: 1.00    Years: 20.00    Types: Cigarettes    Start date: 10/12/1958    Quit date: 04/26/1988  . Smokeless tobacco: Never Used  . Alcohol use No  . Drug use: No  . Sexual activity: No   Other Topics Concern  . Not on file   Social History Narrative   Divorced.  Lives alone.  Ambulates with a walker.     Past Surgical History:  Procedure Laterality Date  . CARDIAC  CATHETERIZATION Left 03/05/2015   Procedure: CENTRAL LINE INSERTION;  Surgeon: Aviva Signs, MD;  Location: [facility];  Service: General;  Laterality: Left;  . CATARACT EXTRACTION W/ INTRAOCULAR LENS  IMPLANT, BILATERAL  ~ 2002  . COLECTOMY WITH COLOSTOMY CREATION/HARTMANN PROCEDURE N/A 03/05/2015   Procedure: PARTIAL COLECTOMY WITH COLOSTOMY CREATION/HARTMANN PROCEDURE;  Surgeon: Aviva Signs, MD;  Location: [facility];  Service: General;  Laterality: N/A;  . CORONARY ANGIOPLASTY WITH STENT PLACEMENT  05/08/11   "1"  . ESOPHAGEAL MANOMETRY N/A 06/06/2014   Procedure: ESOPHAGEAL MANOMETRY (EM);  Surgeon: Jerene Bears, MD;  [facility];  Service: Gastroenterology;  Laterality: N/A;  . ESOPHAGOGASTRODUODENOSCOPY (EGD) WITH PROPOFOL N/A 08/04/2015   Procedure: ESOPHAGOGASTRODUODENOSCOPY (EGD) WITH PROPOFOL;  Surgeon: Mauri Pole, MD;  [facility];  Service: Endoscopy;  Laterality: N/A;  . HERNIA REPAIR    . LAPAROSCOPIC NISSEN FUNDOPLICATION N/A 5/85/9292   Procedure: LAPAROSCOPIC  LYSIS OF ADHESIONS, SEGMENTAL GASTRECTOMY, PARTIAL REDUCTION OF HERNIA;  Surgeon: [name], MD;  [facility]: [facility];  Service: General;  Laterality: N/A;  . NISSEN FUNDOPLICATION  [id number]  . PERCUTANEOUS CORONARY STENT INTERVENTION (PCI-S) N/A 05/08/2011   Procedure: PERCUTANEOUS CORONARY STENT INTERVENTION (PCI-S);  Surgeon: Wellington Hampshire, MD;  Location: [facility] CATH LAB;  Service: Cardiovascular;  Laterality: N/A;  . ROTATOR CUFF REPAIR  ~ 2001   right  . SHOULDER ARTHROSCOPY  ~ 2004; 2005   left; "joint's wore out"  . TONSILLECTOMY  ~ 1944  . TUBAL LIGATION  1972    Family History  Problem Relation Age of Onset  . Heart attack Father   . Hypertension Father   . Diabetes Mother   . Hypertension Mother   . Skin cancer Brother     melanoma  . Colon cancer Neg Hx   . Esophageal cancer Neg Hx   . Rectal cancer Neg Hx   . Stomach cancer Neg Hx     Review of Systems  Constitutional: Negative.     HENT: Negative.   Eyes: Negative.   Respiratory: Negative.   Gastrointestinal: Positive for abdominal distention and nausea. Negative for blood in stool, constipation and vomiting.  Genitourinary: Negative.   Musculoskeletal: Positive for arthralgias, back pain, gait problem and myalgias.  Skin: Negative.       Medication List       Accurate as of 01/15/16  8:41 AM. Always use your most recent med list.          acetaminophen-codeine 300-30 MG tablet Commonly known as:  TYLENOL #3 Take 1 tablet by mouth every 6 (six) hours as needed for moderate pain.   albuterol 108 (90 Base) MCG/ACT inhaler Commonly known as:  PROVENTIL HFA;VENTOLIN HFA Inhale 2 puffs into the lungs every 6 (six) hours as needed for wheezing or shortness of breath.   ALPRAZolam 0.25 MG tablet Commonly known as:  XANAX Take 1 tablet (0.25 mg total) by mouth 2 (two) times daily as needed for anxiety.   citalopram 40 MG tablet Commonly known as:  CELEXA Take 20 mg by mouth at bedtime.   diltiazem 240 MG 24 hr capsule Commonly known as:  CARDIZEM CD Take 1 capsule (240 mg total) by mouth daily.   esomeprazole 40 MG capsule Commonly known as:  NEXIUM Take 40 mg by mouth daily before breakfast.   ferrous sulfate 325 (65 FE) MG EC tablet Take 325 mg by mouth 3 (three) times daily.   furosemide 20 MG tablet Commonly known as:  LASIX Take 1 tablet (20 mg total) by mouth daily.   hydrALAZINE 25 MG tablet Commonly known as:  APRESOLINE Take 1 tablet (25 mg total) by mouth 2 (two) times daily.   isosorbide mononitrate 30 MG 24 hr tablet Commonly known as:  IMDUR Take 30 mg by mouth daily.   loratadine 10 MG tablet Commonly known as:  CLARITIN Take 10 mg by mouth daily.   losartan 100 MG tablet Commonly known as:  COZAAR Take 1 tablet (100 mg total) by mouth daily.   metoprolol succinate 25 MG 24 hr tablet Commonly known as:  TOPROL-XL Take 1 tablet (25 mg total) by mouth daily.    nitroGLYCERIN 0.4 MG SL tablet Commonly known as:  NITROSTAT Place 0.4 mg under the tongue every 5 (five) minutes as needed for chest pain. Reported on 04/26/2015   polyethylene glycol powder powder Commonly known as:  GLYCOLAX/MIRALAX MIX 17 GRAMS IN AT LEAST 8OZ OF WATER/JUICE AND DRINK ONCE OR TWICE DAILY   potassium chloride 10 MEQ tablet Commonly known as:  K-DUR Take 1 tablet (10 mEq total) by mouth daily.   predniSONE 10 MG tablet Commonly known as:  DELTASONE Take 1 tablet (10 mg total) by mouth daily with breakfast. Take 10  days, then 1/2 tablet 10 days, then every other other day.   PRESERVISION AREDS Tabs Take 1 tablet by mouth 2 (two) times daily.   rosuvastatin 10 MG tablet Commonly known as:  CRESTOR Take 10 mg by mouth at bedtime.   traZODone 100 MG tablet Commonly known as:  DESYREL Take 100 mg by mouth at bedtime.   Vitamin D (Ergocalciferol) 50000 units Caps capsule Commonly known as:  DRISDOL Take 50,000 Units by mouth every Tuesday.   zolpidem 10 MG tablet Commonly known as:  AMBIEN Take 10 mg by mouth at bedtime.          Objective:    BP (!) 190/74   Pulse (!) 53   Temp 97 F (36.1 C) (Oral)   Ht 5' 2"  (1.575 m)   Wt 129 lb 12.8 oz (58.9 kg)   BMI 23.74 kg/m   Allergies  Allergen Reactions  . Sulfamethoxazole Rash    Wt Readings from Last 3 Encounters:  01/15/16 129 lb 12.8 oz (58.9 kg)  01/04/16 129 lb (58.5 kg)  12/18/15 126 lb 9.6 oz (57.4 kg)    Physical Exam  Constitutional: She is oriented to person, place, and time. She appears well-developed and well-nourished.  HENT:  Head: Normocephalic and atraumatic.  Eyes: Conjunctivae and EOM are normal. Pupils are equal, round, and reactive to light.  Cardiovascular: Normal rate, regular rhythm, normal heart sounds and intact distal pulses.   Pulmonary/Chest: Effort normal and breath sounds normal.  Abdominal: Soft. Normal appearance and bowel sounds are normal. She exhibits no  shifting dullness.    Colostomy bag placement with incisional hernia above.  Musculoskeletal:       Lumbar back: She exhibits decreased range of motion, tenderness, pain and spasm.  Neurological: She is alert and oriented to person, place, and time. She has normal reflexes.  Skin: Skin is warm and dry. No rash noted.  Psychiatric: She has a normal mood and affect. Her behavior is normal. Judgment and thought content normal.    Results for orders placed or performed in visit on 12/18/15  CBC with Differential/Platelet  Result Value Ref Range   WBC 6.7 3.4 - 10.8 x10E3/uL   RBC 4.04 3.77 - 5.28 x10E6/uL   Hemoglobin 9.8 (L) 11.1 - 15.9 g/dL   Hematocrit 32.4 (L) 34.0 - 46.6 %   MCV 80 79 - 97 fL   MCH 24.3 (L) 26.6 - 33.0 pg   MCHC 30.2 (L) 31.5 - 35.7 g/dL   RDW 18.1 (H) 12.3 - 15.4 %   Platelets 273 150 - 379 x10E3/uL   Neutrophils 71 %   Lymphs 21 %   Monocytes 6 %   Eos 2 %   Basos 0 %   Neutrophils Absolute 4.7 1.4 - 7.0 x10E3/uL   Lymphocytes Absolute 1.4 0.7 - 3.1 x10E3/uL   Monocytes Absolute 0.4 0.1 - 0.9 x10E3/uL   EOS (ABSOLUTE) 0.2 0.0 - 0.4 x10E3/uL   Basophils Absolute 0.0 0.0 - 0.2 x10E3/uL   Immature Granulocytes 0 %   Immature Grans (Abs) 0.0 0.0 - 0.1 x10E3/uL  CMP14+EGFR  Result Value Ref Range   Glucose 93 65 - 99 mg/dL   BUN 16 8 - 27 mg/dL   Creatinine, Ser 1.26 (H) 0.57 - 1.00 mg/dL   GFR calc non Af Amer 41 (L) >59 mL/min/1.73   GFR calc Af Amer 47 (L) >59 mL/min/1.73   BUN/Creatinine Ratio 13 12 - 28   Sodium 142 134 - 144 mmol/L  Potassium 4.3 3.5 - 5.2 mmol/L   Chloride 101 96 - 106 mmol/L   CO2 27 18 - 29 mmol/L   Calcium 8.9 8.7 - 10.3 mg/dL   Total Protein 6.1 6.0 - 8.5 g/dL   Albumin 3.2 (L) 3.5 - 4.8 g/dL   Globulin, Total 2.9 1.5 - 4.5 g/dL   Albumin/Globulin Ratio 1.1 (L) 1.2 - 2.2   Bilirubin Total <0.2 0.0 - 1.2 mg/dL   Alkaline Phosphatase 90 39 - 117 IU/L   AST 8 0 - 40 IU/L   ALT 6 0 - 32 IU/L  Microalbumin / creatinine  urine ratio  Result Value Ref Range   Creatinine, Urine 287.3 Not Estab. mg/dL   Microalbum.,U,Random 76.9 Not Estab. ug/mL   MICROALB/CREAT RATIO 26.8 0.0 - 30.0 mg/g creat      Assessment & Plan:   1. Chronic bilateral low back pain with bilateral sciatica Consider referral to [facility] for chronic back pain - DG Lumbar Spine Complete; Future - acetaminophen-codeine (TYLENOL #3) 300-30 MG tablet; Take 1 tablet by mouth every 6 (six) hours as needed for moderate pain.  Dispense: 60 tablet; Refill: 1  2. Essential hypertension Continue all meds Recheck 3 weeks  3. PAF (paroxysmal atrial fibrillation) (HCC) Continue meds  4. Gastric volvulus Continue meds   Continue all other maintenance medications as listed above.  Follow up plan: Return in about 3 weeks (around 02/05/2016).  Orders Placed This Encounter  Procedures  . DG Lumbar Spine Complete    Educational handout given for sciatica  Terald Sleeper PA-C [facility] [address]  [place], [place] [address] [phone]   01/15/2016, 8:41 AM

## 2016-01-15 NOTE — Patient Instructions (Signed)
Radicular Pain °Radicular pain in either the arm or leg is usually from a bulging or herniated disk in the spine. A piece of the herniated disk may press against the nerves as the nerves exit the spine. This causes pain which is felt at the tips of the nerves down the arm or leg. Other causes of radicular pain may include: °· Fractures. °· Heart disease. °· Cancer. °· An abnormal and usually degenerative state of the nervous system or nerves (neuropathy). °Diagnosis may require CT or MRI scanning to determine the primary cause.  °Nerves that start at the neck (nerve roots) may cause radicular pain in the outer shoulder and arm. It can spread down to the thumb and fingers. The symptoms vary depending on which nerve root has been affected. In most cases radicular pain improves with conservative treatment. Neck problems may require physical therapy, a neck collar, or cervical traction. Treatment may take many weeks, and surgery may be considered if the symptoms do not improve.  °Conservative treatment is also recommended for sciatica. Sciatica causes pain to radiate from the lower back or buttock area down the leg into the foot. Often there is a history of back problems. Most patients with sciatica are better after 2 to 4 weeks of rest and other supportive care. Short term bed rest can reduce the disk pressure considerably. Sitting, however, is not a good position since this increases the pressure on the disk. You should avoid bending, lifting, and all other activities which make the problem worse. Traction can be used in severe cases. Surgery is usually reserved for patients who do not improve within the first months of treatment. °Only take over-the-counter or prescription medicines for pain, discomfort, or fever as directed by your caregiver. Narcotics and muscle relaxants may help by relieving more severe pain and spasm and by providing mild sedation. Cold or massage can give significant relief. Spinal manipulation  is not recommended. It can increase the degree of disc protrusion. Epidural steroid injections are often effective treatment for radicular pain. These injections deliver medicine to the spinal nerve in the space between the protective covering of the spinal cord and back bones (vertebrae). Your caregiver can give you more information about steroid injections. These injections are most effective when given within two weeks of the onset of pain.  °You should see your caregiver for follow up care as recommended. A program for neck and back injury rehabilitation with stretching and strengthening exercises is an important part of management.  °SEEK IMMEDIATE MEDICAL CARE IF: °· You develop increased pain, weakness, or numbness in your arm or leg. °· You develop difficulty with bladder or bowel control. °· You develop abdominal pain. °  °This information is not intended to replace advice given to you by your health care provider. Make sure you discuss any questions you have with your health care provider. °  °Document Released: 04/25/2004 Document Revised: 04/08/2014 Document Reviewed: 10/12/2014 °Elsevier Interactive Patient Education ©2016 [employer] ° °

## 2016-02-01 ENCOUNTER — Other Ambulatory Visit: Payer: Self-pay

## 2016-02-01 DIAGNOSIS — M549 Dorsalgia, unspecified: Secondary | ICD-10-CM

## 2016-02-01 NOTE — Progress Notes (Signed)
What is the question? 

## 2016-02-12 ENCOUNTER — Ambulatory Visit (INDEPENDENT_AMBULATORY_CARE_PROVIDER_SITE_OTHER): Payer: Medicare Other | Admitting: Physician Assistant

## 2016-02-12 ENCOUNTER — Encounter: Payer: Self-pay | Admitting: Physician Assistant

## 2016-02-12 VITALS — BP 160/80 | HR 55 | Temp 97.1°F | Ht 62.0 in | Wt 132.8 lb

## 2016-02-12 DIAGNOSIS — J0110 Acute frontal sinusitis, unspecified: Secondary | ICD-10-CM

## 2016-02-12 DIAGNOSIS — I10 Essential (primary) hypertension: Secondary | ICD-10-CM | POA: Diagnosis not present

## 2016-02-12 DIAGNOSIS — M5136 Other intervertebral disc degeneration, lumbar region: Secondary | ICD-10-CM

## 2016-02-12 MED ORDER — AMOXICILLIN 500 MG PO CAPS: 500.0000 mg | ORAL_CAPSULE | Freq: Three times a day (TID) | ORAL | 0 refills | Status: DC

## 2016-02-12 MED ORDER — HYDRALAZINE HCL 50 MG PO TABS: 50.0000 mg | ORAL_TABLET | Freq: Three times a day (TID) | ORAL | 1 refills | Status: DC

## 2016-02-12 NOTE — Patient Instructions (Signed)

## 2016-02-12 NOTE — Progress Notes (Signed)
BP (!) 160/80   Pulse (!) 55   Temp 97.1 F (36.2 C) (Oral)   Ht 5' 2" (1.575 m)   Wt 132 lb 12.8 oz (60.2 kg)   BMI 24.29 kg/m    Subjective:    Patient ID: [name], female    DOB: October 21, 1934, 81 y.o.   MRN: [id number]  HPI: [name] is a 81 y.o. female presenting on 02/12/2016 for Follow-up (1 month- on back pain )  BACK PAIN: She has had greater than 2 months sciatica and lumbar pain. Approximately one month ago she came to the office and was given a Depo-Medrol injection. She cannot take NSAIDs due to her severe GI conditions and colostomy. She has been taking to have to 5 mg of prednisone daily and has had no improvement in her lumbar spine. No radiology has been performed on this. She is established with [facility] orthopedics. Has been seen by Dr. Tonita Cong and planning an injection with Ramos. She will be having an MRI on Thursday  BLOOD PRESSURE: Feels good and needing recheck. While at the orthopedist she had good readings.  Denies chest pain, SOB, no NVD.  SINUSITIS:  Drainage, cough, no wheezing. Some green mucus and headache.   Relevant past medical, surgical, family and social history reviewed and updated as indicated. Interim medical history since our last visit reviewed. Allergies and medications reviewed and updated. DATA REVIEWED: CHART IN EPIC  Social History   Social History  . Marital status: Divorced    Spouse name: N/A  . Number of children: 3  . Years of education: N/A   Occupational History  . Retired    Social History Main Topics  . Smoking status: Former Smoker    Packs/day: 1.00    Years: 20.00    Types: Cigarettes    Start date: 10/12/1958    Quit date: 04/26/1988  . Smokeless tobacco: Never Used  . Alcohol use No  . Drug use: No  . Sexual activity: No   Other Topics Concern  . Not on file   Social History Narrative   Divorced.  Lives alone.  Ambulates with a walker.     Past Surgical History:  Procedure Laterality Date  .  CARDIAC CATHETERIZATION Left 03/05/2015   Procedure: CENTRAL LINE INSERTION;  Surgeon: Aviva Signs, MD;  Location: [facility];  Service: General;  Laterality: Left;  . CATARACT EXTRACTION W/ INTRAOCULAR LENS  IMPLANT, BILATERAL  ~ 2002  . COLECTOMY WITH COLOSTOMY CREATION/HARTMANN PROCEDURE N/A 03/05/2015   Procedure: PARTIAL COLECTOMY WITH COLOSTOMY CREATION/HARTMANN PROCEDURE;  Surgeon: Aviva Signs, MD;  Location: [facility];  Service: General;  Laterality: N/A;  . CORONARY ANGIOPLASTY WITH STENT PLACEMENT  05/08/11   "1"  . ESOPHAGEAL MANOMETRY N/A 06/06/2014   Procedure: ESOPHAGEAL MANOMETRY (EM);  Surgeon: Jerene Bears, MD;  [facility];  Service: Gastroenterology;  Laterality: N/A;  . ESOPHAGOGASTRODUODENOSCOPY (EGD) WITH PROPOFOL N/A 08/04/2015   Procedure: ESOPHAGOGASTRODUODENOSCOPY (EGD) WITH PROPOFOL;  Surgeon: Mauri Pole, MD;  [facility];  Service: Endoscopy;  Laterality: N/A;  . HERNIA REPAIR    . LAPAROSCOPIC NISSEN FUNDOPLICATION N/A 5/80/9983   Procedure: LAPAROSCOPIC  LYSIS OF ADHESIONS, SEGMENTAL GASTRECTOMY, PARTIAL REDUCTION OF HERNIA;  Surgeon: [name], MD;  [facility]: [facility];  Service: General;  Laterality: N/A;  . NISSEN FUNDOPLICATION  3825  . PERCUTANEOUS CORONARY STENT INTERVENTION (PCI-S) N/A 05/08/2011   Procedure: PERCUTANEOUS CORONARY STENT INTERVENTION (PCI-S);  Surgeon: Wellington Hampshire, MD;  Location:  [facility];  Service: Cardiovascular;  Laterality: N/A;  . ROTATOR CUFF REPAIR  ~ 2001   right  . SHOULDER ARTHROSCOPY  ~ 2004; 2005   left; "joint's wore out"  . TONSILLECTOMY  ~ 1944  . TUBAL LIGATION  1972    Family History  Problem Relation Age of Onset  . Heart attack Father   . Hypertension Father   . Diabetes Mother   . Hypertension Mother   . Skin cancer Brother     melanoma  . Colon cancer Neg Hx   . Esophageal cancer Neg Hx   . Rectal cancer Neg Hx   . Stomach cancer Neg Hx     Review of Systems  Constitutional:  Positive for fatigue. Negative for fever.  HENT: Positive for congestion, postnasal drip, sinus pain, sinus pressure and sore throat. Negative for sneezing.   Eyes: Negative.   Respiratory: Positive for cough. Negative for shortness of breath and wheezing.   Gastrointestinal: Negative for abdominal distention, blood in stool, constipation, nausea and vomiting.  Genitourinary: Negative.   Musculoskeletal: Positive for arthralgias, back pain, gait problem and myalgias.  Skin: Negative.       Medication List       Accurate as of 02/12/16 10:13 AM. Always use your most recent med list.          acetaminophen-codeine 300-30 MG tablet Commonly known as:  TYLENOL #3 Take 1 tablet by mouth every 6 (six) hours as needed for moderate pain.   albuterol 108 (90 Base) MCG/ACT inhaler Commonly known as:  PROVENTIL HFA;VENTOLIN HFA Inhale 2 puffs into the lungs every 6 (six) hours as needed for wheezing or shortness of breath.   ALPRAZolam 0.25 MG tablet Commonly known as:  XANAX Take 1 tablet (0.25 mg total) by mouth 2 (two) times daily as needed for anxiety.   amoxicillin 500 MG capsule Commonly known as:  AMOXIL Take 1 capsule (500 mg total) by mouth 3 (three) times daily.   citalopram 40 MG tablet Commonly known as:  CELEXA Take 20 mg by mouth at bedtime.   diltiazem 240 MG 24 hr capsule Commonly known as:  CARDIZEM CD Take 1 capsule (240 mg total) by mouth daily.   esomeprazole 40 MG capsule Commonly known as:  NEXIUM Take 40 mg by mouth daily before breakfast.   ferrous sulfate 325 (65 FE) MG EC tablet Take 325 mg by mouth 3 (three) times daily.   furosemide 20 MG tablet Commonly known as:  LASIX Take 1 tablet (20 mg total) by mouth daily.   hydrALAZINE 50 MG tablet Commonly known as:  APRESOLINE Take 1 tablet (50 mg total) by mouth 3 (three) times daily.   isosorbide mononitrate 30 MG 24 hr tablet Commonly known as:  IMDUR Take 30 mg by mouth daily.   loratadine  10 MG tablet Commonly known as:  CLARITIN Take 10 mg by mouth daily.   losartan 100 MG tablet Commonly known as:  COZAAR Take 1 tablet (100 mg total) by mouth daily.   metoprolol succinate 25 MG 24 hr tablet Commonly known as:  TOPROL-XL Take 1 tablet (25 mg total) by mouth daily.   nitroGLYCERIN 0.4 MG SL tablet Commonly known as:  NITROSTAT Place 0.4 mg under the tongue every 5 (five) minutes as needed for chest pain. Reported on 04/26/2015   polyethylene glycol powder powder Commonly known as:  GLYCOLAX/MIRALAX MIX 17 GRAMS IN AT LEAST 8OZ OF WATER/JUICE AND DRINK ONCE OR TWICE DAILY  potassium chloride 10 MEQ tablet Commonly known as:  K-DUR Take 1 tablet (10 mEq total) by mouth daily.   PRESERVISION AREDS Tabs Take 1 tablet by mouth 2 (two) times daily.   rosuvastatin 10 MG tablet Commonly known as:  CRESTOR Take 10 mg by mouth at bedtime.   traZODone 100 MG tablet Commonly known as:  DESYREL Take 100 mg by mouth at bedtime.   Vitamin D (Ergocalciferol) 50000 units Caps capsule Commonly known as:  DRISDOL Take 50,000 Units by mouth every Tuesday.   zolpidem 10 MG tablet Commonly known as:  AMBIEN Take 10 mg by mouth at bedtime.          Objective:    BP (!) 160/80   Pulse (!) 55   Temp 97.1 F (36.2 C) (Oral)   Ht 5' 2" (1.575 m)   Wt 132 lb 12.8 oz (60.2 kg)   BMI 24.29 kg/m   Allergies  Allergen Reactions  . Sulfamethoxazole Rash    Wt Readings from Last 3 Encounters:  02/12/16 132 lb 12.8 oz (60.2 kg)  01/15/16 129 lb 12.8 oz (58.9 kg)  01/04/16 129 lb (58.5 kg)    Physical Exam  Constitutional: She is oriented to person, place, and time. She appears well-developed and well-nourished.  HENT:  Head: Normocephalic and atraumatic.  Eyes: Conjunctivae and EOM are normal. Pupils are equal, round, and reactive to light.  Cardiovascular: Normal rate, regular rhythm, normal heart sounds and intact distal pulses.   Pulmonary/Chest: Effort normal  and breath sounds normal.  Abdominal: Soft. Normal appearance and bowel sounds are normal. She exhibits no shifting dullness.  Colostomy bag placement with incisional hernia above.  Musculoskeletal:       Lumbar back: She exhibits decreased range of motion, tenderness, pain and spasm.  Neurological: She is alert and oriented to person, place, and time. She has normal reflexes.  Skin: Skin is warm and dry. No rash noted.  Psychiatric: She has a normal mood and affect. Her behavior is normal. Judgment and thought content normal.    Results for orders placed or performed in visit on 12/18/15  CBC with Differential/Platelet  Result Value Ref Range   WBC 6.7 3.4 - 10.8 x10E3/uL   RBC 4.04 3.77 - 5.28 x10E6/uL   Hemoglobin 9.8 (L) 11.1 - 15.9 g/dL   Hematocrit 32.4 (L) 34.0 - 46.6 %   MCV 80 79 - 97 fL   MCH 24.3 (L) 26.6 - 33.0 pg   MCHC 30.2 (L) 31.5 - 35.7 g/dL   RDW 18.1 (H) 12.3 - 15.4 %   Platelets 273 150 - 379 x10E3/uL   Neutrophils 71 %   Lymphs 21 %   Monocytes 6 %   Eos 2 %   Basos 0 %   Neutrophils Absolute 4.7 1.4 - 7.0 x10E3/uL   Lymphocytes Absolute 1.4 0.7 - 3.1 x10E3/uL   Monocytes Absolute 0.4 0.1 - 0.9 x10E3/uL   EOS (ABSOLUTE) 0.2 0.0 - 0.4 x10E3/uL   Basophils Absolute 0.0 0.0 - 0.2 x10E3/uL   Immature Granulocytes 0 %   Immature Grans (Abs) 0.0 0.0 - 0.1 x10E3/uL  CMP14+EGFR  Result Value Ref Range   Glucose 93 65 - 99 mg/dL   BUN 16 8 - 27 mg/dL   Creatinine, Ser 1.26 (H) 0.57 - 1.00 mg/dL   GFR calc non Af Amer 41 (L) >59 mL/min/1.73   GFR calc Af Amer 47 (L) >59 mL/min/1.73   BUN/Creatinine Ratio 13 12 - 28  Sodium 142 134 - 144 mmol/L   Potassium 4.3 3.5 - 5.2 mmol/L   Chloride 101 96 - 106 mmol/L   CO2 27 18 - 29 mmol/L   Calcium 8.9 8.7 - 10.3 mg/dL   Total Protein 6.1 6.0 - 8.5 g/dL   Albumin 3.2 (L) 3.5 - 4.8 g/dL   Globulin, Total 2.9 1.5 - 4.5 g/dL   Albumin/Globulin Ratio 1.1 (L) 1.2 - 2.2   Bilirubin Total <0.2 0.0 - 1.2 mg/dL    Alkaline Phosphatase 90 39 - 117 IU/L   AST 8 0 - 40 IU/L   ALT 6 0 - 32 IU/L  Microalbumin / creatinine urine ratio  Result Value Ref Range   Creatinine, Urine 287.3 Not Estab. mg/dL   Microalbum.,U,Random 76.9 Not Estab. ug/mL   MICROALB/CREAT RATIO 26.8 0.0 - 30.0 mg/g creat      Assessment & Plan:   1. Acute non-recurrent frontal sinusitis - amoxicillin (AMOXIL) 500 MG capsule; Take 1 capsule (500 mg total) by mouth 3 (three) times daily.  Dispense: 30 capsule; Refill: 0  2. Essential hypertension - hydrALAZINE (APRESOLINE) 50 MG tablet; Take 1 tablet (50 mg total) by mouth 3 (three) times daily.  Dispense: 90 tablet; Refill: 1  3. DDD (degenerative disc disease), lumbar Continue with orthopedist and plans.  Continue all other maintenance medications as listed above.  Follow up plan: Return in about 4 weeks (around 03/11/2016) for recheck and labs.  Educational handout given for sciatica  Terald Sleeper PA-C [facility] [address]  [place], [place] [address] [phone]   02/12/2016, 10:13 AM

## 2016-02-27 ENCOUNTER — Ambulatory Visit: Payer: Medicare Other | Attending: Specialist | Admitting: Physical Therapy

## 2016-02-27 ENCOUNTER — Other Ambulatory Visit: Payer: Self-pay | Admitting: Physician Assistant

## 2016-02-27 DIAGNOSIS — R293 Abnormal posture: Secondary | ICD-10-CM

## 2016-02-27 DIAGNOSIS — M545 Low back pain: Secondary | ICD-10-CM | POA: Insufficient documentation

## 2016-02-27 DIAGNOSIS — G8929 Other chronic pain: Secondary | ICD-10-CM | POA: Insufficient documentation

## 2016-02-27 DIAGNOSIS — M533 Sacrococcygeal disorders, not elsewhere classified: Secondary | ICD-10-CM

## 2016-02-27 NOTE — Therapy (Addendum)
[facility] [place], [place], [address] Phone: [phone]   Fax:  [phone]  Physical Therapy Evaluation  Patient Details  Name: [name] MRN: [id number] Date of Birth: April 06, 1934 Referring Provider: Susa Day MD.  Encounter Date: 02/27/2016      PT End of Session - 02/27/16 0948    Visit Number 1   Number of Visits 12   Date for PT Re-Evaluation 04/27/16   PT Start Time 0903   PT Stop Time 1004   PT Time Calculation (min) 61 min   Activity Tolerance Patient tolerated treatment well   Behavior During Therapy [facility] for tasks assessed/performed      Past Medical History:  Diagnosis Date  . Anemia   . Anxiety state, unspecified   . Atrial fibrillation with RVR ([place])   . [name]'s esophagus   . Cellulitis of extremity   . Congestive heart failure, unspecified    diastolic heart failure  . Coronary artery disease    a. Moderate to severe coronary disease involving the left anterior descending artery and right coronary artery.  Sequential stenosis in the LAD is significant.  Right coronary artery is moderate to severely likely nonischemic. Questionable small LVOT obstruction.  No Brockenbrough maneuver was performed.  Catheterization January 2013; b. 05/2014 MV no isch/infarct, EF 60%.  . Depressive disorder, not elsewhere classified   . Diverticulitis   . Erosive esophagitis 08/04/2015  . Esophageal reflux   . Gastric volvulus 08/04/2015  . Gastritis   . H/O hiatal hernia   . Hypertension   . IBS (irritable bowel syndrome)   . Infection of esophagostomy ([place])   . Macular degeneration   . Melanoma ([place])    removal on back   . Pelvic abscess in female   . Perforated bowel ([place])   . Perforated sigmoid colon ([place])   . PONV (postoperative nausea and vomiting)   . Pulmonary hypertension    PA systolic pressure 27-51 mmHg by echocardiogram, PA pressure 33/10 by cardiac catheterization PA saturation 62%  thermodilution cardiac index 2.0 thick cardiac index 2.4  . Pure hypercholesterolemia   . Tricuspid regurgitation    moderate tricuspid regurgitation by echocardiogram, prior use of anorexic agents    Past Surgical History:  Procedure Laterality Date  . CARDIAC CATHETERIZATION Left 03/05/2015   Procedure: CENTRAL LINE INSERTION;  Surgeon: Aviva Signs, MD;  Location: [facility];  Service: General;  Laterality: Left;  . CATARACT EXTRACTION W/ INTRAOCULAR LENS  IMPLANT, BILATERAL  ~ 2002  . COLECTOMY WITH COLOSTOMY CREATION/HARTMANN PROCEDURE N/A 03/05/2015   Procedure: PARTIAL COLECTOMY WITH COLOSTOMY CREATION/HARTMANN PROCEDURE;  Surgeon: Aviva Signs, MD;  Location: [facility];  Service: General;  Laterality: N/A;  . CORONARY ANGIOPLASTY WITH STENT PLACEMENT  05/08/11   "1"  . ESOPHAGEAL MANOMETRY N/A 06/06/2014   Procedure: ESOPHAGEAL MANOMETRY (EM);  Surgeon: Jerene Bears, MD;  [facility];  Service: Gastroenterology;  Laterality: N/A;  . ESOPHAGOGASTRODUODENOSCOPY (EGD) WITH PROPOFOL N/A 08/04/2015   Procedure: ESOPHAGOGASTRODUODENOSCOPY (EGD) WITH PROPOFOL;  Surgeon: Mauri Pole, MD;  [facility];  Service: Endoscopy;  Laterality: N/A;  . HERNIA REPAIR    . LAPAROSCOPIC NISSEN FUNDOPLICATION N/A 7/00/1749   Procedure: LAPAROSCOPIC  LYSIS OF ADHESIONS, SEGMENTAL GASTRECTOMY, PARTIAL REDUCTION OF HERNIA;  Surgeon: [name], MD;  [facility];  Service: General;  Laterality: N/A;  . NISSEN FUNDOPLICATION  4496  . PERCUTANEOUS CORONARY STENT INTERVENTION (PCI-S) N/A 05/08/2011   Procedure: PERCUTANEOUS CORONARY STENT INTERVENTION (  PCI-S);  Surgeon: Wellington Hampshire, MD;  Location: [facility];  Service: Cardiovascular;  Laterality: N/A;  . ROTATOR CUFF REPAIR  ~ 2001   right  . SHOULDER ARTHROSCOPY  ~ 2004; 2005   left; "joint's wore out"  . TONSILLECTOMY  ~ 1944  . TUBAL LIGATION  1972    There were no vitals filed for this visit.       Subjective  Assessment - 02/27/16 0941    Subjective The patient states that on October 10, 2015 while walking she was suddenly hit with intense low back pain.  The pain progressed to include pain into both her LE's.  She reports that pain has improved but she continues to experience pain down the length of her left posterior LE to her calf and right buttock pain.  Her pain-level today is a 6/10 but can rise to a 7-8/10 with increased activity.  Rest and heat make her back feel better.  She is scheduled to have an ESI on 03/05/16.   Limitations Walking;Standing   How long can you stand comfortably? 10 minutes.   How long can you walk comfortably? 10 minutes.   Patient Stated Goals Get out of pain.   Currently in Pain? Yes   Pain Score 6    Pain Location Back   Pain Orientation Left;Right;Mid;Lower   Pain Descriptors / Indicators Aching;Sharp;Shooting   Pain Type Chronic pain   Pain Onset More than a month ago   Pain Frequency Constant   Aggravating Factors  Please see above.   Pain Relieving Factors Please see above.            [facility] PT Assessment - 02/27/16 0001      Assessment   Medical Diagnosis Lumbosacral DDD.   Referring Provider Susa Day MD.   Onset Date/Surgical Date --  10/10/15.     Precautions   Precautions None     Restrictions   Weight Bearing Restrictions No     Balance Screen   Has the patient fallen in the past 6 months No   Has the patient had a decrease in activity level because of a fear of falling?  No   Is the patient reluctant to leave their home because of a fear of falling?  No     Home Environment   Living Environment Private residence     Prior Function   Level of Independence Independent     Posture/Postural Control   Posture/Postural Control Postural limitations   Postural Limitations Rounded Shoulders;Forward head;Decreased lumbar lordosis;Increased thoracic kyphosis   Posture Comments --  Scoliosis noted lower thoracic/upper lumbar region.      Palpation   Palpation comment Tender to palpation with overpressure at L5-S1 and bilateral SIJ region.     Special Tests    Special Tests Lumbar;Leg LengthTest  Normal bilateral LE DTR's.   Lumbar Tests --  (-)SLR and FABER testing.   Leg length test  --  Equal leg lengths.     Ambulation/Gait   Gait Comments The patients walks in some lumbar flexion.                   [facility] PT Treatment/Exercise - 02/27/16 0001      Modalities   Modalities Electrical Stimulation;Moist Heat     Moist Heat Therapy   Number Minutes Moist Heat 20 Minutes   Moist Heat Location Lumbar Spine     Electrical Stimulation   Electrical Stimulation Location L-S region.  Electrical Stimulation Action IFC   Electrical Stimulation Parameters 80-150 HZ constant. x 20 minutes.   Electrical Stimulation Goals Pain                  PT Short Term Goals - 02/27/16 1015      PT SHORT TERM GOAL #1   Title STG's=LTG's.           PT Long Term Goals - 02/27/16 1015      PT LONG TERM GOAL #1   Title Independent with a HEP.   Time 8   Period Weeks   Status New     PT LONG TERM GOAL #2   Title Stand 20 minutes with pain not > 3/10.   Time 8   Period Weeks   Status New     PT LONG TERM GOAL #3   Title Walk a community distance with pain not > 3/10.   Time 8   Period Weeks   Status New     PT LONG TERM GOAL #4   Title Eliminate LE pain/symptoms.   Time 8   Period Weeks   Status New               Plan - 02/27/16 0948    Clinical Impression Statement (P)  The patient presents with palpable pain at L5-S1 and both SIJ's left>right.  Pain is referring into her LE's especially her left LE with pain going all the way down to her left calf.  She has a significant loss of active lumbar range of motion an dshe is limited with regards to standing and walking due to pain which has inhibited her ability to effectively perform ADL's.  Her special tests are negative.    Rehab Potential (P)  Excellent   PT Frequency (P)  3x / week   PT Duration (P)  4 weeks   PT Treatment/Interventions (P)  ADLs/Self Care Home Management;Cryotherapy;Electrical Stimulation;Moist Heat;Traction;Ultrasound;Therapeutic activities;Therapeutic exercise;Patient/family education;Manual techniques;Dry needling      Patient will benefit from skilled therapeutic intervention in order to improve the following deficits and impairments:  (P) Decreased activity tolerance, Postural dysfunction, Pain, Decreased range of motion  Visit Diagnosis: Chronic bilateral low back pain, with sciatica presence unspecified - Plan: PT plan of care cert/re-cert  Abnormal posture - Plan: PT plan of care cert/re-cert      G-Codes - 78/29/56 1017    Functional Assessment Tool Used Clinical judgement.   Functional Limitation Mobility: Walking and moving around   Mobility: Walking and Moving Around Current Status [phone]) At least 40 percent but less than 60 percent impaired, limited or restricted       Problem List Patient Active Problem List   Diagnosis Date Noted  . DDD (degenerative disc disease), lumbar 02/12/2016  . History of GI bleed 12/18/2015  . Gastric volvulus 08/29/2015  . Erosive esophagitis 08/29/2015  . Atrial fibrillation with RVR ([place]) 08/09/2015  . Irreducible hiatal hernia   . TIA (transient ischemic attack)   . Gastric outlet obstruction   . Acute diastolic (congestive) heart failure 06/17/2015  . Pressure ulcer 06/17/2015  . Acute diastolic CHF (congestive heart failure) ([place]) 06/17/2015  . Colonic mass 05/02/2015  . Anemia due to chronic blood loss 05/02/2015  . PAF (paroxysmal atrial fibrillation) ([place]) 05/02/2015  . Anxiety and depression 05/02/2015  . CKD (chronic kidney disease) stage 3, GFR 30-59 ml/min 03/03/2015  . History of percutaneous coronary intervention   . Coronary artery disease   . HTN (  hypertension) 02/05/2011  . HYPERCHOLESTEROLEMIA 07/16/2007   PHYSICAL THERAPY DISCHARGE SUMMARY  Visits from Start of Care: 1.  Current functional level related to goals / functional outcomes: See above.   Remaining deficits: See above.   Education / Equipment:  Plan: Patient agrees to discharge.  Patient goals were not met. Patient is being discharged due to not returning since the last visit.  ?????       Jalie Eiland, [place] MPT 02/27/2016, 10:24 AM  [facility] [address] [place], [place], [address] Phone: [phone]   Fax:  [phone]  Name: Jayani Rozman [place]oble MRN: [id number] Date of Birth: 1934/12/03

## 2016-03-01 ENCOUNTER — Encounter (HOSPITAL_COMMUNITY): Payer: Self-pay | Admitting: Emergency Medicine

## 2016-03-01 ENCOUNTER — Emergency Department (HOSPITAL_COMMUNITY): Payer: Medicare Other

## 2016-03-01 ENCOUNTER — Inpatient Hospital Stay (HOSPITAL_COMMUNITY)
Admission: EM | Admit: 2016-03-01 | Discharge: 2016-03-07 | DRG: 095 | Disposition: A | Discharge status: Home Health | Payer: Medicare Other | Attending: Internal Medicine | Admitting: Internal Medicine

## 2016-03-01 DIAGNOSIS — K219 Gastro-esophageal reflux disease without esophagitis: Secondary | ICD-10-CM | POA: Diagnosis present

## 2016-03-01 DIAGNOSIS — I361 Nonrheumatic tricuspid (valve) insufficiency: Secondary | ICD-10-CM | POA: Diagnosis present

## 2016-03-01 DIAGNOSIS — Z79899 Other long term (current) drug therapy: Secondary | ICD-10-CM

## 2016-03-01 DIAGNOSIS — M4646 Discitis, unspecified, lumbar region: Secondary | ICD-10-CM | POA: Diagnosis present

## 2016-03-01 DIAGNOSIS — Z9049 Acquired absence of other specified parts of digestive tract: Secondary | ICD-10-CM | POA: Diagnosis not present

## 2016-03-01 DIAGNOSIS — Z808 Family history of malignant neoplasm of other organs or systems: Secondary | ICD-10-CM

## 2016-03-01 DIAGNOSIS — B9689 Other specified bacterial agents as the cause of diseases classified elsewhere: Secondary | ICD-10-CM | POA: Diagnosis not present

## 2016-03-01 DIAGNOSIS — Z955 Presence of coronary angioplasty implant and graft: Secondary | ICD-10-CM

## 2016-03-01 DIAGNOSIS — G8929 Other chronic pain: Secondary | ICD-10-CM

## 2016-03-01 DIAGNOSIS — K589 Irritable bowel syndrome without diarrhea: Secondary | ICD-10-CM | POA: Diagnosis present

## 2016-03-01 DIAGNOSIS — Z95828 Presence of other vascular implants and grafts: Secondary | ICD-10-CM | POA: Diagnosis not present

## 2016-03-01 DIAGNOSIS — Z87891 Personal history of nicotine dependence: Secondary | ICD-10-CM | POA: Diagnosis not present

## 2016-03-01 DIAGNOSIS — I480 Paroxysmal atrial fibrillation: Secondary | ICD-10-CM | POA: Diagnosis present

## 2016-03-01 DIAGNOSIS — Z85820 Personal history of malignant melanoma of skin: Secondary | ICD-10-CM

## 2016-03-01 DIAGNOSIS — I272 Pulmonary hypertension, unspecified: Secondary | ICD-10-CM | POA: Diagnosis present

## 2016-03-01 DIAGNOSIS — G061 Intraspinal abscess and granuloma: Principal | ICD-10-CM | POA: Diagnosis present

## 2016-03-01 DIAGNOSIS — G062 Extradural and subdural abscess, unspecified: Secondary | ICD-10-CM

## 2016-03-01 DIAGNOSIS — Z888 Allergy status to other drugs, medicaments and biological substances status: Secondary | ICD-10-CM | POA: Diagnosis not present

## 2016-03-01 DIAGNOSIS — I130 Hypertensive heart and chronic kidney disease with heart failure and stage 1 through stage 4 chronic kidney disease, or unspecified chronic kidney disease: Secondary | ICD-10-CM | POA: Diagnosis present

## 2016-03-01 DIAGNOSIS — Z8249 Family history of ischemic heart disease and other diseases of the circulatory system: Secondary | ICD-10-CM

## 2016-03-01 DIAGNOSIS — Z933 Colostomy status: Secondary | ICD-10-CM

## 2016-03-01 DIAGNOSIS — M545 Low back pain: Secondary | ICD-10-CM

## 2016-03-01 DIAGNOSIS — F329 Major depressive disorder, single episode, unspecified: Secondary | ICD-10-CM | POA: Diagnosis present

## 2016-03-01 DIAGNOSIS — Z8719 Personal history of other diseases of the digestive system: Secondary | ICD-10-CM | POA: Diagnosis not present

## 2016-03-01 DIAGNOSIS — Z833 Family history of diabetes mellitus: Secondary | ICD-10-CM | POA: Diagnosis not present

## 2016-03-01 DIAGNOSIS — R1011 Right upper quadrant pain: Secondary | ICD-10-CM

## 2016-03-01 DIAGNOSIS — I5032 Chronic diastolic (congestive) heart failure: Secondary | ICD-10-CM | POA: Diagnosis present

## 2016-03-01 DIAGNOSIS — F411 Generalized anxiety disorder: Secondary | ICD-10-CM | POA: Diagnosis present

## 2016-03-01 DIAGNOSIS — I10 Essential (primary) hypertension: Secondary | ICD-10-CM | POA: Diagnosis present

## 2016-03-01 DIAGNOSIS — I2510 Atherosclerotic heart disease of native coronary artery without angina pectoris: Secondary | ICD-10-CM | POA: Diagnosis present

## 2016-03-01 DIAGNOSIS — K449 Diaphragmatic hernia without obstruction or gangrene: Secondary | ICD-10-CM | POA: Diagnosis present

## 2016-03-01 DIAGNOSIS — N183 Chronic kidney disease, stage 3 unspecified: Secondary | ICD-10-CM | POA: Diagnosis present

## 2016-03-01 DIAGNOSIS — N189 Chronic kidney disease, unspecified: Secondary | ICD-10-CM | POA: Diagnosis not present

## 2016-03-01 DIAGNOSIS — M869 Osteomyelitis, unspecified: Secondary | ICD-10-CM | POA: Diagnosis present

## 2016-03-01 DIAGNOSIS — M4647 Discitis, unspecified, lumbosacral region: Secondary | ICD-10-CM | POA: Diagnosis present

## 2016-03-01 DIAGNOSIS — M549 Dorsalgia, unspecified: Secondary | ICD-10-CM | POA: Diagnosis present

## 2016-03-01 DIAGNOSIS — M4640 Discitis, unspecified, site unspecified: Secondary | ICD-10-CM

## 2016-03-01 HISTORY — DX: Extradural and subdural abscess, unspecified: G06.2

## 2016-03-01 LAB — COMPREHENSIVE METABOLIC PANEL
ALBUMIN: 3.4 g/dL — AB (ref 3.5–5.0)
ALK PHOS: 59 U/L (ref 38–126)
ALT: 6 U/L — ABNORMAL LOW (ref 14–54)
AST: 14 U/L — AB (ref 15–41)
Anion gap: 8 (ref 5–15)
BILIRUBIN TOTAL: 0.5 mg/dL (ref 0.3–1.2)
BUN: 17 mg/dL (ref 6–20)
CALCIUM: 8.7 mg/dL — AB (ref 8.9–10.3)
CO2: 23 mmol/L (ref 22–32)
CREATININE: 1.3 mg/dL — AB (ref 0.44–1.00)
Chloride: 101 mmol/L (ref 101–111)
GFR calc Af Amer: 44 mL/min — ABNORMAL LOW (ref >60)
GFR, EST NON AFRICAN AMERICAN: 38 mL/min — AB (ref >60)
GLUCOSE: 98 mg/dL (ref 65–99)
Potassium: 3.2 mmol/L — ABNORMAL LOW (ref 3.5–5.1)
Sodium: 132 mmol/L — ABNORMAL LOW (ref 135–145)
TOTAL PROTEIN: 6.3 g/dL — AB (ref 6.5–8.1)

## 2016-03-01 LAB — CBC WITH DIFFERENTIAL/PLATELET
BASOS ABS: 0 10*3/uL (ref 0.0–0.1)
BASOS PCT: 0 %
EOS ABS: 0.1 10*3/uL (ref 0.0–0.7)
EOS PCT: 1 %
HEMATOCRIT: 33.1 % — AB (ref 36.0–46.0)
Hemoglobin: 10.4 g/dL — ABNORMAL LOW (ref 12.0–15.0)
Lymphocytes Relative: 24 %
Lymphs Abs: 2.1 10*3/uL (ref 0.7–4.0)
MCH: 25.4 pg — ABNORMAL LOW (ref 26.0–34.0)
MCHC: 31.4 g/dL (ref 30.0–36.0)
MCV: 80.7 fL (ref 78.0–100.0)
MONO ABS: 0.7 10*3/uL (ref 0.1–1.0)
MONOS PCT: 8 %
Neutro Abs: 5.8 10*3/uL (ref 1.7–7.7)
Neutrophils Relative %: 67 %
PLATELETS: 186 10*3/uL (ref 150–400)
RBC: 4.1 MIL/uL (ref 3.87–5.11)
RDW: 17.2 % — AB (ref 11.5–15.5)
WBC: 8.7 10*3/uL (ref 4.0–10.5)

## 2016-03-01 LAB — URINALYSIS, ROUTINE W REFLEX MICROSCOPIC
BILIRUBIN URINE: NEGATIVE
Glucose, UA: NEGATIVE mg/dL
Hgb urine dipstick: NEGATIVE
KETONES UR: NEGATIVE mg/dL
Leukocytes, UA: NEGATIVE
NITRITE: NEGATIVE
PH: 7.5 (ref 5.0–8.0)
PROTEIN: NEGATIVE mg/dL
Specific Gravity, Urine: 1.015 (ref 1.005–1.030)

## 2016-03-01 LAB — LIPASE, BLOOD: LIPASE: 16 U/L (ref 11–51)

## 2016-03-01 MED ORDER — ROSUVASTATIN CALCIUM 5 MG PO TABS
10.0000 mg | ORAL_TABLET | Freq: Every day | ORAL | Status: DC
  Administered 2016-03-02 – 2016-03-06 (×6): 10 mg via ORAL
  Filled 2016-03-01 (×5): qty 2

## 2016-03-01 MED ORDER — ZOLPIDEM TARTRATE 5 MG PO TABS
5.0000 mg | ORAL_TABLET | Freq: Every day | ORAL | Status: DC
  Administered 2016-03-02 – 2016-03-06 (×6): 5 mg via ORAL
  Filled 2016-03-01 (×5): qty 1

## 2016-03-01 MED ORDER — HYDRALAZINE HCL 50 MG PO TABS
50.0000 mg | ORAL_TABLET | Freq: Three times a day (TID) | ORAL | Status: DC
  Administered 2016-03-02 – 2016-03-07 (×16): 50 mg via ORAL
  Filled 2016-03-01 (×16): qty 1

## 2016-03-01 MED ORDER — ONDANSETRON HCL 4 MG PO TABS: 4.0000 mg | ORAL_TABLET | Freq: Four times a day (QID) | ORAL | Status: DC | PRN

## 2016-03-01 MED ORDER — FUROSEMIDE 20 MG PO TABS
20.0000 mg | ORAL_TABLET | Freq: Every day | ORAL | Status: DC
  Administered 2016-03-02 – 2016-03-07 (×5): 20 mg via ORAL
  Filled 2016-03-01 (×6): qty 1

## 2016-03-01 MED ORDER — HYDROCODONE-ACETAMINOPHEN 5-325 MG PO TABS
1.0000 | ORAL_TABLET | Freq: Once | ORAL | Status: DC
Start: 2016-03-01 — End: 2016-03-01

## 2016-03-01 MED ORDER — OXYCODONE HCL 5 MG PO TABS
5.0000 mg | ORAL_TABLET | ORAL | Status: DC | PRN
Start: 2016-03-01 — End: 2016-03-07
  Administered 2016-03-02 – 2016-03-07 (×13): 5 mg via ORAL
  Filled 2016-03-01 (×13): qty 1

## 2016-03-01 MED ORDER — MORPHINE SULFATE (PF) 2 MG/ML IV SOLN
2.0000 mg | Freq: Once | INTRAVENOUS | Status: AC
  Administered 2016-03-01: 2 mg via INTRAMUSCULAR
  Filled 2016-03-01: qty 1

## 2016-03-01 MED ORDER — SODIUM CHLORIDE 0.9% FLUSH: 3.0000 mL | INTRAVENOUS | Status: DC | PRN

## 2016-03-01 MED ORDER — LOSARTAN POTASSIUM 50 MG PO TABS
100.0000 mg | ORAL_TABLET | Freq: Every day | ORAL | Status: DC
  Administered 2016-03-02: 100 mg via ORAL
  Filled 2016-03-01: qty 2

## 2016-03-01 MED ORDER — ALPRAZOLAM 0.25 MG PO TABS
0.2500 mg | ORAL_TABLET | Freq: Two times a day (BID) | ORAL | Status: DC | PRN
  Administered 2016-03-05 – 2016-03-06 (×2): 0.25 mg via ORAL
  Filled 2016-03-01 (×2): qty 1

## 2016-03-01 MED ORDER — ISOSORBIDE MONONITRATE ER 30 MG PO TB24
30.0000 mg | ORAL_TABLET | Freq: Every day | ORAL | Status: DC
  Administered 2016-03-02 – 2016-03-07 (×6): 30 mg via ORAL
  Filled 2016-03-01 (×6): qty 1

## 2016-03-01 MED ORDER — CITALOPRAM HYDROBROMIDE 10 MG PO TABS
20.0000 mg | ORAL_TABLET | Freq: Every day | ORAL | Status: DC
  Administered 2016-03-02 – 2016-03-06 (×6): 20 mg via ORAL
  Filled 2016-03-01 (×5): qty 2

## 2016-03-01 MED ORDER — SODIUM CHLORIDE 0.9 % IV SOLN: 250.0000 mL | INTRAVENOUS | Status: DC | PRN

## 2016-03-01 MED ORDER — VANCOMYCIN HCL IN DEXTROSE 1-5 GM/200ML-% IV SOLN
1000.0000 mg | Freq: Once | INTRAVENOUS | Status: AC
  Administered 2016-03-02: 1000 mg via INTRAVENOUS
  Filled 2016-03-01: qty 200

## 2016-03-01 MED ORDER — PIPERACILLIN-TAZOBACTAM 3.375 G IVPB
3.3750 g | Freq: Three times a day (TID) | INTRAVENOUS | Status: DC
  Administered 2016-03-02 (×2): 3.375 g via INTRAVENOUS
  Filled 2016-03-01 (×4): qty 50

## 2016-03-01 MED ORDER — PIPERACILLIN-TAZOBACTAM 3.375 G IVPB 30 MIN
3.3750 g | INTRAVENOUS | Status: AC
  Administered 2016-03-01: 3.375 g via INTRAVENOUS
  Filled 2016-03-01: qty 50

## 2016-03-01 MED ORDER — HYDROMORPHONE HCL 1 MG/ML IJ SOLN
1.0000 mg | INTRAMUSCULAR | Status: DC | PRN
  Administered 2016-03-02: 1 mg via INTRAVENOUS

## 2016-03-01 MED ORDER — ALBUTEROL SULFATE (2.5 MG/3ML) 0.083% IN NEBU: 2.5000 mg | INHALATION_SOLUTION | Freq: Four times a day (QID) | RESPIRATORY_TRACT | Status: DC | PRN

## 2016-03-01 MED ORDER — SODIUM CHLORIDE 0.9 % IV SOLN
INTRAVENOUS | Status: AC
  Administered 2016-03-02: via INTRAVENOUS

## 2016-03-01 MED ORDER — TRAZODONE HCL 100 MG PO TABS
100.0000 mg | ORAL_TABLET | Freq: Every day | ORAL | Status: DC
  Administered 2016-03-02 – 2016-03-06 (×6): 100 mg via ORAL
  Filled 2016-03-01 (×5): qty 1

## 2016-03-01 MED ORDER — METOPROLOL SUCCINATE ER 25 MG PO TB24
25.0000 mg | ORAL_TABLET | Freq: Every day | ORAL | Status: DC
  Administered 2016-03-02 – 2016-03-07 (×6): 25 mg via ORAL
  Filled 2016-03-01 (×6): qty 1

## 2016-03-01 MED ORDER — MORPHINE SULFATE (PF) 2 MG/ML IV SOLN
2.0000 mg | Freq: Once | INTRAVENOUS | Status: AC
  Administered 2016-03-01: 2 mg via INTRAVENOUS
  Filled 2016-03-01: qty 1

## 2016-03-01 MED ORDER — SODIUM CHLORIDE 0.9% FLUSH
3.0000 mL | Freq: Two times a day (BID) | INTRAVENOUS | Status: DC
  Administered 2016-03-02 – 2016-03-06 (×8): 3 mL via INTRAVENOUS

## 2016-03-01 MED ORDER — ONDANSETRON HCL 4 MG/2ML IJ SOLN: 4.0000 mg | Freq: Four times a day (QID) | INTRAMUSCULAR | Status: DC | PRN

## 2016-03-01 MED ORDER — POTASSIUM CHLORIDE CRYS ER 10 MEQ PO TBCR
10.0000 meq | EXTENDED_RELEASE_TABLET | Freq: Every day | ORAL | Status: DC
  Administered 2016-03-02 – 2016-03-07 (×5): 10 meq via ORAL
  Filled 2016-03-01 (×6): qty 1

## 2016-03-01 NOTE — ED Notes (Signed)
Pt was leaving AMA. Pt attempted to stand to get into wheelchair to leave AMA and pt was unable to stand up without severe pain. Pt decided she wanted to stay and have ED workup done. MD notified.

## 2016-03-01 NOTE — ED Notes (Signed)
Pt leaving AMA. MD notified.

## 2016-03-01 NOTE — Progress Notes (Signed)
ANTIBIOTIC CONSULT NOTE-Preliminary  Pharmacy Consult for Vancomycin and Zosyn Indication: osteo  Allergies  Allergen Reactions  . Sulfamethoxazole Rash   Patient Measurements: Height: 5\' 2"  (157.5 cm) Weight: 129 lb (58.5 kg) IBW/kg (Calculated) : 50.1  Vital Signs: Temp: 98.1 F (36.7 C) (12/01 2016) Temp Source: Oral (12/01 2016) BP: 169/71 (12/01 2155) Pulse Rate: 56 (12/01 2155)  Labs:  Recent Labs  03/01/16 1917  WBC 8.7  HGB 10.4*  PLT 186  CREATININE 1.30*   Estimated Creatinine Clearance: 27.8 mL/min (by C-G formula based on SCr of 1.3 mg/dL (H)).  No results for input(s): VANCOTROUGH, VANCOPEAK, VANCORANDOM, GENTTROUGH, GENTPEAK, GENTRANDOM, TOBRATROUGH, TOBRAPEAK, TOBRARND, AMIKACINPEAK, AMIKACINTROU, AMIKACIN in the last 72 hours.   Microbiology: No results found for this or any previous visit (from the past 720 hour(s)).  Medical History: Past Medical History:  Diagnosis Date  . Anemia   . Anxiety state, unspecified   . Atrial fibrillation with RVR ([place])   . [name]'s esophagus   . Bilateral sciatica   . Cellulitis of extremity   . Chronic back pain   . Congestive heart failure, unspecified    diastolic heart failure  . Coronary artery disease    a. Moderate to severe coronary disease involving the left anterior descending artery and right coronary artery.  Sequential stenosis in the LAD is significant.  Right coronary artery is moderate to severely likely nonischemic. Questionable small LVOT obstruction.  No Brockenbrough maneuver was performed.  Catheterization January 2013; b. 05/2014 MV no isch/infarct, EF 60%.  . Depressive disorder, not elsewhere classified   . Diverticulitis   . Erosive esophagitis 08/04/2015  . Esophageal reflux   . Gastric volvulus 08/04/2015  . Gastritis   . H/O hiatal hernia   . Hypertension   . IBS (irritable bowel syndrome)   . Infection of esophagostomy ([place])   . Macular degeneration   . Melanoma ([place])    removal  on back   . Pelvic abscess in female   . Perforated bowel ([place])   . Perforated sigmoid colon ([place])   . PONV (postoperative nausea and vomiting)   . Pulmonary hypertension    PA systolic pressure [id number] mmHg by echocardiogram, PA pressure 33/10 by cardiac catheterization PA saturation 62% thermodilution cardiac index 2.0 thick cardiac index 2.4  . Pure hypercholesterolemia   . Tricuspid regurgitation    moderate tricuspid regurgitation by echocardiogram, prior use of anorexic agents   Anti-infectives    Start     Dose/Rate Route Frequency Ordered Stop   03/02/16 0600  piperacillin-tazobactam (ZOSYN) IVPB 3.375 g     3.375 g 12.5 mL/hr over 240 Minutes Intravenous Every 8 hours 03/01/16 2201     03/01/16 2215  piperacillin-tazobactam (ZOSYN) IVPB 3.375 g     3.375 g 100 mL/hr over 30 Minutes Intravenous STAT 03/01/16 2201 03/02/16 2215   03/01/16 2215  vancomycin (VANCOCIN) IVPB 1000 mg/200 mL premix     1,000 mg 200 mL/hr over 60 Minutes Intravenous  Once 03/01/16 2203       Assessment: Mri today shows discitis and epidural absess at L5 S1.  [facility], and advised broad spectrum IV abx and hospitalization at [facility].  Pt referred for admission for her serious infection in her spine.  Goal of Therapy:  Vancomycin trough level 15-20 mcg/ml  Plan:  Preliminary review of pertinent patient information completed.  Protocol will be initiated with Zosyn 3.375gm IV q8hrs and Vancomycin 1000mg  x 1 then  750mg  IV q24hrs (renally adjusted).   Forestine Na clinical pharmacist will complete review during morning rounds to assess patient and finalize treatment regimen.  Hart Robinsons A, [place] 03/01/2016,10:04 PM

## 2016-03-01 NOTE — H&P (Signed)
History and Physical    [name] [id number] DOB: Apr 25, 1934 DOA: 03/01/2016  PCP: Terald Sleeper, PA-C  Patient coming from: home  Chief Complaint:  Low back pain  HPI: [name] is a 81 y.o. female with medical history significant of back pain for several months, was referred to orthopedic surgery a week ago by her PCP and pt reports an MRI of her back was done last week and it was thought she had an infection, but then was told there was no infection in her back and she was set up to get an injection next week for her pain in her back.  Her pain persisted and has worsened in the last 2 days, to the point she has not been able to get out of bed and walk because of the pain.  She denies weakness in her legs.  She denies any urinary or bowel incontinence.  No saddle anesthesia.  She denies any fevers or chills at home.  She did have a recent sinus infection last month for which she was treated with 10 days of antibiotics, this is the only infection she recalls having in the last 6 months.  Mri today shows discitis and epidural absess at L5 S1.  Dr. Cyndy [facility] at [facility] called, and advised broad spectrum IV abx and hospitalization at [facility].  Pt referred for admission for her serious infection in her spine.  Review of Systems: As per HPI otherwise 10 point review of systems negative.   Past Medical History:  Diagnosis Date  . Anemia   . Anxiety state, unspecified   . Atrial fibrillation with RVR ([place])   . [name]'s esophagus   . Bilateral sciatica   . Cellulitis of extremity   . Chronic back pain   . Congestive heart failure, unspecified    diastolic heart failure  . Coronary artery disease    a. Moderate to severe coronary disease involving the left anterior descending artery and right coronary artery.  Sequential stenosis in the LAD is significant.  Right coronary artery is moderate to severely likely nonischemic. Questionable small LVOT obstruction.  No Brockenbrough  maneuver was performed.  Catheterization January 2013; b. 05/2014 MV no isch/infarct, EF 60%.  . Depressive disorder, not elsewhere classified   . Diverticulitis   . Erosive esophagitis 08/04/2015  . Esophageal reflux   . Gastric volvulus 08/04/2015  . Gastritis   . H/O hiatal hernia   . Hypertension   . IBS (irritable bowel syndrome)   . Infection of esophagostomy ([place])   . Macular degeneration   . Melanoma ([place])    removal on back   . Pelvic abscess in female   . Perforated bowel ([place])   . Perforated sigmoid colon ([place])   . PONV (postoperative nausea and vomiting)   . Pulmonary hypertension    PA systolic pressure [id number] mmHg by echocardiogram, PA pressure 33/10 by cardiac catheterization PA saturation 62% thermodilution cardiac index 2.0 thick cardiac index 2.4  . Pure hypercholesterolemia   . Tricuspid regurgitation    moderate tricuspid regurgitation by echocardiogram, prior use of anorexic agents    Past Surgical History:  Procedure Laterality Date  . CARDIAC CATHETERIZATION Left 03/05/2015   Procedure: CENTRAL LINE INSERTION;  Surgeon: Aviva Signs, MD;  Location: [facility];  Service: General;  Laterality: Left;  . CATARACT EXTRACTION W/ INTRAOCULAR LENS  IMPLANT, BILATERAL  ~ 2002  . COLECTOMY WITH COLOSTOMY CREATION/HARTMANN PROCEDURE N/A 03/05/2015   Procedure: PARTIAL COLECTOMY WITH  COLOSTOMY CREATION/HARTMANN PROCEDURE;  Surgeon: Aviva Signs, MD;  Location: [facility];  Service: General;  Laterality: N/A;  . CORONARY ANGIOPLASTY WITH STENT PLACEMENT  05/08/11   "1"  . ESOPHAGEAL MANOMETRY N/A 06/06/2014   Procedure: ESOPHAGEAL MANOMETRY (EM);  Surgeon: Jerene Bears, MD;  Location: [facility];  Service: Gastroenterology;  Laterality: N/A;  . ESOPHAGOGASTRODUODENOSCOPY (EGD) WITH PROPOFOL N/A 08/04/2015   Procedure: ESOPHAGOGASTRODUODENOSCOPY (EGD) WITH PROPOFOL;  Surgeon: Mauri Pole, MD;  Location: [facility];  Service: Endoscopy;  Laterality: N/A;  . HERNIA REPAIR    .  LAPAROSCOPIC NISSEN FUNDOPLICATION N/A [id number]   Procedure: LAPAROSCOPIC  LYSIS OF ADHESIONS, SEGMENTAL GASTRECTOMY, PARTIAL REDUCTION OF HERNIA;  Surgeon: [name], MD;  Location: [facility];  Service: General;  Laterality: N/A;  . NISSEN FUNDOPLICATION  [id number]  . PERCUTANEOUS CORONARY STENT INTERVENTION (PCI-S) N/A 05/08/2011   Procedure: PERCUTANEOUS CORONARY STENT INTERVENTION (PCI-S);  Surgeon: Wellington Hampshire, MD;  Location: [facility] CATH LAB;  Service: Cardiovascular;  Laterality: N/A;  . ROTATOR CUFF REPAIR  ~ 2001   right  . SHOULDER ARTHROSCOPY  ~ 2004; 2005   left; "joint's wore out"  . TONSILLECTOMY  ~ 1944  . TUBAL LIGATION  1972     reports that she quit smoking about 27 years ago. Her smoking use included Cigarettes. She started smoking about 57 years ago. She has a 20.00 pack-year smoking history. She has never used smokeless tobacco. She reports that she does not drink alcohol or use drugs.  Allergies  Allergen Reactions  . Sulfamethoxazole Rash    Family History  Problem Relation Age of Onset  . Heart attack Father   . Hypertension Father   . Diabetes Mother   . Hypertension Mother   . Skin cancer Brother     melanoma  . Colon cancer Neg Hx   . Esophageal cancer Neg Hx   . Rectal cancer Neg Hx   . Stomach cancer Neg Hx     Prior to Admission medications   Medication Sig Start Date End Date Taking? Authorizing Provider  albuterol (PROVENTIL HFA;VENTOLIN HFA) 108 (90 Base) MCG/ACT inhaler Inhale 2 puffs into the lungs every 6 (six) hours as needed for wheezing or shortness of breath.    Yes Historical Provider, MD  ALPRAZolam (XANAX) 0.25 MG tablet Take 1 tablet (0.25 mg total) by mouth 2 (two) times daily as needed for anxiety. 05/08/15  Yes Theodis Blaze, MD  citalopram (CELEXA) 40 MG tablet Take 20 mg by mouth at bedtime.    Yes Historical Provider, MD  esomeprazole (NEXIUM) 40 MG capsule Take 40 mg by mouth daily before breakfast.    Yes Historical Provider, MD    ferrous sulfate 325 (65 FE) MG EC tablet Take 325 mg by mouth daily.    Yes Historical Provider, MD  furosemide (LASIX) 20 MG tablet Take 1 tablet (20 mg total) by mouth daily. 06/19/15  Yes Kinnie Feil, MD  hydrALAZINE (APRESOLINE) 50 MG tablet Take 1 tablet (50 mg total) by mouth 3 (three) times daily. 02/12/16  Yes Terald Sleeper, PA-C  isosorbide mononitrate (IMDUR) 30 MG 24 hr tablet Take 30 mg by mouth daily. 07/21/15  Yes Historical Provider, MD  loratadine (CLARITIN) 10 MG tablet Take 10 mg by mouth daily.   Yes Historical Provider, MD  losartan (COZAAR) 100 MG tablet Take 1 tablet (100 mg total) by mouth daily. 11/11/12  Yes Herminio Commons, MD  metoprolol succinate (TOPROL-XL) 25 MG 24 hr  tablet Take 1 tablet (25 mg total) by mouth daily. 08/29/15  Yes Rexene Alberts, MD  Multiple Vitamins-Minerals (PRESERVISION AREDS) TABS Take 1 tablet by mouth 2 (two) times daily.    Yes Historical Provider, MD  nitroGLYCERIN (NITROSTAT) 0.4 MG SL tablet Place 0.4 mg under the tongue every 5 (five) minutes as needed for chest pain. Reported on 04/26/2015   Yes Historical Provider, MD  polyethylene glycol powder (GLYCOLAX/MIRALAX) powder MIX 17 GRAMS IN AT LEAST 8OZ OF WATER/JUICE AND DRINK ONCE OR TWICE DAILY 08/29/15  Yes Rexene Alberts, MD  potassium chloride (K-DUR) 10 MEQ tablet Take 1 tablet (10 mEq total) by mouth daily. 06/19/15  Yes Kinnie Feil, MD  rosuvastatin (CRESTOR) 10 MG tablet Take 10 mg by mouth at bedtime.    Yes Historical Provider, MD  traZODone (DESYREL) 100 MG tablet Take 100 mg by mouth at bedtime.    Yes Historical Provider, MD  Vitamin D, Ergocalciferol, (DRISDOL) 50000 UNITS CAPS Take 50,000 Units by mouth every Tuesday.    Yes Historical Provider, MD  zolpidem (AMBIEN) 10 MG tablet Take 10 mg by mouth at bedtime.   Yes Historical Provider, MD  acetaminophen-codeine (TYLENOL #3) 300-30 MG tablet Take 1 tablet by mouth every 6 (six) hours as needed for moderate  pain. Patient not taking: Reported on 03/01/2016 01/15/16   Terald Sleeper, PA-C  amoxicillin (AMOXIL) 500 MG capsule Take 1 capsule (500 mg total) by mouth 3 (three) times daily. Patient not taking: Reported on 03/01/2016 02/12/16   Terald Sleeper, PA-C  predniSONE (DELTASONE) 10 MG tablet TAKE 1 TAB DAILY FOR 10 DAYS THEN TAKE 1/2 TAB DAILY FOR 10 DAYS THENTAKE 1/2 TAB EVERY OTHER DAY Patient not taking: Reported on 03/01/2016 02/27/16   Terald Sleeper, PA-C    Physical Exam: Vitals:   03/01/16 1547 03/01/16 1548 03/01/16 1830 03/01/16 2016  BP: 147/82 147/82 180/61 (!) 181/54  Pulse: 60 106 (!) 58 (!) 58  Resp: 18 16  17   Temp: 98.8 F (37.1 C) 98.8 F (37.1 C)  98.1 F (36.7 C)  TempSrc: Oral Oral  Oral  SpO2: 100% 99% (!) 88% 98%  Weight:  58.5 kg (129 lb)    Height:  5\' 2"  (1.575 m)      Constitutional: NAD, calm, comfortable Vitals:   03/01/16 1547 03/01/16 1548 03/01/16 1830 03/01/16 2016  BP: 147/82 147/82 180/61 (!) 181/54  Pulse: 60 106 (!) 58 (!) 58  Resp: 18 16  17   Temp: 98.8 F (37.1 C) 98.8 F (37.1 C)  98.1 F (36.7 C)  TempSrc: Oral Oral  Oral  SpO2: 100% 99% (!) 88% 98%  Weight:  58.5 kg (129 lb)    Height:  5\' 2"  (1.575 m)     Eyes: PERRL, lids and conjunctivae normal ENMT: Mucous membranes are moist. Posterior pharynx clear of any exudate or lesions.Normal dentition.  Neck: normal, supple, no masses, no thyromegaly Respiratory: clear to auscultation bilaterally, no wheezing, no crackles. Normal respiratory effort. No accessory muscle use.  Cardiovascular: Regular rate and rhythm, no murmurs / rubs / gallops. No extremity edema. 2+ pedal pulses. No carotid bruits.  Abdomen: no tenderness, no masses palpated. No hepatosplenomegaly. Bowel sounds positive.  Musculoskeletal: no clubbing / cyanosis. No joint deformity upper and lower extremities. Good ROM, no contractures. Normal muscle tone. Pain with palpation along lower spine Skin: no rashes, lesions,  ulcers. No induration Neurologic: CN 2-12 grossly intact. Sensation intact, DTR normal. Strength 5/5 in all  4.  Psychiatric: Normal judgment and insight. Alert and oriented x 3. Normal mood.    Labs on Admission: I have personally reviewed following labs and imaging studies  CBC:  Recent Labs Lab 03/01/16 1917  WBC 8.7  NEUTROABS 5.8  HGB 10.4*  HCT 33.1*  MCV 80.7  PLT 99991111   Basic Metabolic Panel:  Recent Labs Lab 03/01/16 1917  NA 132*  K 3.2*  CL 101  CO2 23  GLUCOSE 98  BUN 17  CREATININE 1.30*  CALCIUM 8.7*   GFR: Estimated Creatinine Clearance: 27.8 mL/min (by C-G formula based on SCr of 1.3 mg/dL (H)). Liver Function Tests:  Recent Labs Lab 03/01/16 1917  AST 14*  ALT 6*  ALKPHOS 59  BILITOT 0.5  PROT 6.3*  ALBUMIN 3.4*    Recent Labs Lab 03/01/16 1917  LIPASE 16   Urine analysis:    Component Value Date/Time   COLORURINE AMBER (A) 08/03/2015 1403   APPEARANCEUR CLOUDY (A) 08/03/2015 1403   LABSPEC 1.022 08/03/2015 1403   PHURINE 7.0 08/03/2015 1403   GLUCOSEU NEGATIVE 08/03/2015 1403   HGBUR LARGE (A) 08/03/2015 1403   BILIRUBINUR NEGATIVE 08/03/2015 1403   KETONESUR 40 (A) 08/03/2015 1403   PROTEINUR 100 (A) 08/03/2015 1403   UROBILINOGEN 0.2 02/10/2015 1936   NITRITE NEGATIVE 08/03/2015 1403   LEUKOCYTESUR TRACE (A) 08/03/2015 1403   Radiological Exams on Admission: Mr Lumbar Spine Wo Contrast  Result Date: 03/01/2016 CLINICAL DATA:  81 y/o  F; lower back pain and unable to walk. EXAM: MRI LUMBAR SPINE WITHOUT CONTRAST TECHNIQUE: Multiplanar, multisequence MR imaging of the lumbar spine was performed. No intravenous contrast was administered. COMPARISON:  None. FINDINGS: Segmentation:  Standard. Alignment:  Normal lumbar lordosis.  Grade 1 L5-S1 anterolisthesis. Vertebrae: There is diffuse edema throughout the L5 and S1 vertebral bodies and increased signal within the intervertebral disc likely representing discitis osteomyelitis. The  increased disc signal extends superiorly into the right anterior and right lateral epidural space at the L5 level (series 7: Image 33 and 6:10) as well as the prevertebral space to the left and anterior of the S1 vertebral body (6:13 and 7:40). Additionally, there is prevertebral edema extending from the L4 level overlying the sacrum in the presacral fat. Increased signal at the L4-5 intervertebral disc level may represent developing infection as well. Conus medullaris: Extends to the lower L1 level and appears normal. Paraspinal and other soft tissues: As above. Right kidney interpolar partially visualize cyst. Disc levels: L1-2: No significant disc displacement, foraminal narrowing, or canal stenosis. L2-3: Minimal disc bulge. No significant foraminal narrowing or canal stenosis. L3-4: Small disc bulge with mild facet and ligamentum flavum hypertrophy. No significant foraminal narrowing. Mild narrowing of lateral recesses and mild canal stenosis. L4-5: Moderate disc bulge with moderate facet and ligamentum flavum hypertrophy. Mild bilateral foraminal narrowing. Severe canal stenosis. L5-S1: Anterolisthesis with uncovered disc, extensive facet and ligamentum flavum hypertrophy, and fluid collection in the right anterior and lateral epidural space. Moderate to severe canal stenosis with crowding of cauda equina and severe bilateral foraminal narrowing. IMPRESSION: 1. L5-S1 discitis osteomyelitis. Right anterior and lateral epidural collection at the L5 level and presacral collection to the left and anterior of S1 vertebral body. Paraspinal inflammatory changes of soft tissues. The full extent of disease can be better characterized with a contrast examination. 2. The L5-S1 epidural collection and degenerative disease results in moderate to severe canal stenosis with crowding of the cauda equina at the L5-S1 level. 3. Mild  increased signal within the L4-5 intervertebral disc may represent early infection. 4.  Additional degenerative changes of the spine resulting in severe L4-5 canal stenosis and severe foraminal narrowing at the L5-S1 level bilaterally. These results were called by telephone at the time of interpretation on 03/01/2016 at 7:31 pm to Dr. Francine Graven , who verbally acknowledged these results. Electronically Signed   By: Kristine Garbe M.D.   On: 03/01/2016 19:34   Old records reviewed [place] discussed with dr Elise Benne  Assessment/Plan 81 yo female with lower back pain for over 2 months found to have L5 S1 discitis/osteo/epidural absess Principal Problem:   Discitis of lumbar region L5- S1- with  Possible epidural absess- provide pt with iv vancomycin and zosyn for now.  Pt will be transferred to [place] as there is no neurosurgical specialist available at [facility] nor a method of obtaining culture/drainage of absess if needed.  Will keep npo after midnight and hold anticoagulants in [place] procedure is needed in the am.  No evidence of cauda equina syndrome at this time.  Have written order for nursing staff to notify dr ditty on arrival to [facility].   Active Problems:   Epidural abscess L5- S1- as above   Back pain, acute- noted   HTN (hypertension)- cont home meds   Coronary artery disease- noted   CKD (chronic kidney disease) stage 3, GFR 30-59 ml/min- stable, at baseline   PAF (paroxysmal atrial fibrillation) ([place])- cont home meds   Chronic diastolic CHF (congestive heart failure) ([place])- noted, cont lasix, no ivf at this time    DVT prophylaxis: scds, holding lovenox for potential procedure in the am  Code Status:  full Family Communication: none Disposition Plan:  Per day team Consults called:  Dr Cyndy Freeze called with neurosurgery at [facility] by dr Elise Benne, he has been added to treatment team in epic Admission status: admission, transfer from [place] to [facility] for specialty care   Kadarious Dikes A MD [facility]  If  7PM-7AM, please contact night-coverage [email or web address] Password [email or web address]  03/01/2016, 9:21 PM

## 2016-03-01 NOTE — ED Triage Notes (Addendum)
Pt brought in by [facility] c/o lower back pain due to disc problems and c/o  "gallbladder problems". Pt reports she feels nauseated constantly and has intermittent vomiting. Vomiting x 1 in the last 24 hours. Pt reports she hasn't been able to ambulate since yesterday due to the lower back pain.

## 2016-03-01 NOTE — ED Notes (Signed)
Pt refusing all radiology imaging, labs and medications at this time. Pt reports she has called her daughter and is going home.

## 2016-03-01 NOTE — ED Provider Notes (Signed)
[facility] DEPT Provider Note   CSN: [id number] Arrival date & time: 03/01/16  1542     History   Chief Complaint Chief Complaint  Patient presents with  . Back Pain    HPI [name] is a 81 y.o. female.  HPI  Pt was seen at 1555. Per pt, c/o gradual onset and persistence of constant acute flair of her chronic low back "pain" for the past 2 days.  Denies any change in her usual chronic pain pattern.  Pain worsens with palpation of the area and body position changes. States she has not been able to walk due to her pain. Has been taking ibuprofen with transient improvement. Denies incont/retention of bowel or bladder, no saddle anesthesia, no focal motor weakness, no tingling/numbness in extremities, no fevers, no injury, no falls.    Past Medical History:  Diagnosis Date  . Anemia   . Anxiety state, unspecified   . Atrial fibrillation with RVR ([place])   . [name]'s esophagus   . Bilateral sciatica   . Cellulitis of extremity   . Chronic back pain   . Congestive heart failure, unspecified    diastolic heart failure  . Coronary artery disease    a. Moderate to severe coronary disease involving the left anterior descending artery and right coronary artery.  Sequential stenosis in the LAD is significant.  Right coronary artery is moderate to severely likely nonischemic. Questionable small LVOT obstruction.  No Brockenbrough maneuver was performed.  Catheterization January 2013; b. 05/2014 MV no isch/infarct, EF 60%.  . Depressive disorder, not elsewhere classified   . Diverticulitis   . Erosive esophagitis 08/04/2015  . Esophageal reflux   . Gastric volvulus 08/04/2015  . Gastritis   . H/O hiatal hernia   . Hypertension   . IBS (irritable bowel syndrome)   . Infection of esophagostomy ([place])   . Macular degeneration   . Melanoma ([place])    removal on back   . Pelvic abscess in female   . Perforated bowel ([place])   . Perforated sigmoid colon ([place])   . PONV (postoperative nausea  and vomiting)   . Pulmonary hypertension    PA systolic pressure [id number] mmHg by echocardiogram, PA pressure 33/10 by cardiac catheterization PA saturation 62% thermodilution cardiac index 2.0 thick cardiac index 2.4  . Pure hypercholesterolemia   . Tricuspid regurgitation    moderate tricuspid regurgitation by echocardiogram, prior use of anorexic agents    Patient Active Problem List   Diagnosis Date Noted  . DDD (degenerative disc disease), lumbar 02/12/2016  . History of GI bleed 12/18/2015  . Gastric volvulus 08/29/2015  . Erosive esophagitis 08/29/2015  . Atrial fibrillation with RVR ([place]) 08/09/2015  . Irreducible hiatal hernia   . TIA (transient ischemic attack)   . Gastric outlet obstruction   . Acute diastolic (congestive) heart failure 06/17/2015  . Pressure ulcer 06/17/2015  . Acute diastolic CHF (congestive heart failure) ([place]) 06/17/2015  . Colonic mass 05/02/2015  . Anemia due to chronic blood loss 05/02/2015  . PAF (paroxysmal atrial fibrillation) ([place]) 05/02/2015  . Anxiety and depression 05/02/2015  . CKD (chronic kidney disease) stage 3, GFR 30-59 ml/min 03/03/2015  . History of percutaneous coronary intervention   . Coronary artery disease   . HTN (hypertension) 02/05/2011  . HYPERCHOLESTEROLEMIA 07/16/2007    Past Surgical History:  Procedure Laterality Date  . CARDIAC CATHETERIZATION Left 03/05/2015   Procedure: CENTRAL LINE INSERTION;  Surgeon: Aviva Signs, MD;  Location: [facility];  Service:  General;  Laterality: Left;  . CATARACT EXTRACTION W/ INTRAOCULAR LENS  IMPLANT, BILATERAL  ~ 2002  . COLECTOMY WITH COLOSTOMY CREATION/HARTMANN PROCEDURE N/A 03/05/2015   Procedure: PARTIAL COLECTOMY WITH COLOSTOMY CREATION/HARTMANN PROCEDURE;  Surgeon: Aviva Signs, MD;  Location: [facility];  Service: General;  Laterality: N/A;  . CORONARY ANGIOPLASTY WITH STENT PLACEMENT  05/08/11   "1"  . ESOPHAGEAL MANOMETRY N/A 06/06/2014   Procedure: ESOPHAGEAL MANOMETRY (EM);  Surgeon:  Jerene Bears, MD;  Location: [facility];  Service: Gastroenterology;  Laterality: N/A;  . ESOPHAGOGASTRODUODENOSCOPY (EGD) WITH PROPOFOL N/A 08/04/2015   Procedure: ESOPHAGOGASTRODUODENOSCOPY (EGD) WITH PROPOFOL;  Surgeon: Mauri Pole, MD;  Location: [facility];  Service: Endoscopy;  Laterality: N/A;  . HERNIA REPAIR    . LAPAROSCOPIC NISSEN FUNDOPLICATION N/A [id number]   Procedure: LAPAROSCOPIC  LYSIS OF ADHESIONS, SEGMENTAL GASTRECTOMY, PARTIAL REDUCTION OF HERNIA;  Surgeon: [name], MD;  Location: [facility];  Service: General;  Laterality: N/A;  . NISSEN FUNDOPLICATION  [id number]  . PERCUTANEOUS CORONARY STENT INTERVENTION (PCI-S) N/A 05/08/2011   Procedure: PERCUTANEOUS CORONARY STENT INTERVENTION (PCI-S);  Surgeon: Wellington Hampshire, MD;  Location: [facility] CATH LAB;  Service: Cardiovascular;  Laterality: N/A;  . ROTATOR CUFF REPAIR  ~ 2001   right  . SHOULDER ARTHROSCOPY  ~ 2004; 2005   left; "joint's wore out"  . TONSILLECTOMY  ~ 1944  . TUBAL LIGATION  1972    OB History    No data available       Home Medications    Prior to Admission medications   Medication Sig Start Date End Date Taking? Authorizing Provider  acetaminophen-codeine (TYLENOL #3) 300-30 MG tablet Take 1 tablet by mouth every 6 (six) hours as needed for moderate pain. 01/15/16   Terald Sleeper, PA-C  albuterol (PROVENTIL HFA;VENTOLIN HFA) 108 (90 Base) MCG/ACT inhaler Inhale 2 puffs into the lungs every 6 (six) hours as needed for wheezing or shortness of breath.     Historical Provider, MD  ALPRAZolam Duanne Moron) 0.25 MG tablet Take 1 tablet (0.25 mg total) by mouth 2 (two) times daily as needed for anxiety. 05/08/15   Theodis Blaze, MD  amoxicillin (AMOXIL) 500 MG capsule Take 1 capsule (500 mg total) by mouth 3 (three) times daily. 02/12/16   Terald Sleeper, PA-C  citalopram (CELEXA) 40 MG tablet Take 20 mg by mouth at bedtime.     Historical Provider, MD  diltiazem (CARDIZEM CD) 240 MG 24 hr capsule Take 1 capsule  (240 mg total) by mouth daily. 08/09/15   Hosie Poisson, MD  esomeprazole (NEXIUM) 40 MG capsule Take 40 mg by mouth daily before breakfast.     Historical Provider, MD  ferrous sulfate 325 (65 FE) MG EC tablet Take 325 mg by mouth 3 (three) times daily.     Historical Provider, MD  furosemide (LASIX) 20 MG tablet Take 1 tablet (20 mg total) by mouth daily. 06/19/15   Kinnie Feil, MD  hydrALAZINE (APRESOLINE) 50 MG tablet Take 1 tablet (50 mg total) by mouth 3 (three) times daily. 02/12/16   Terald Sleeper, PA-C  isosorbide mononitrate (IMDUR) 30 MG 24 hr tablet Take 30 mg by mouth daily. 07/21/15   Historical Provider, MD  loratadine (CLARITIN) 10 MG tablet Take 10 mg by mouth daily.    Historical Provider, MD  losartan (COZAAR) 100 MG tablet Take 1 tablet (100 mg total) by mouth daily. 11/11/12   Herminio Commons, MD  metoprolol succinate (TOPROL-XL) 25 MG  24 hr tablet Take 1 tablet (25 mg total) by mouth daily. 08/29/15   Rexene Alberts, MD  Multiple Vitamins-Minerals (PRESERVISION AREDS) TABS Take 1 tablet by mouth 2 (two) times daily.     Historical Provider, MD  nitroGLYCERIN (NITROSTAT) 0.4 MG SL tablet Place 0.4 mg under the tongue every 5 (five) minutes as needed for chest pain. Reported on 04/26/2015    Historical Provider, MD  polyethylene glycol powder (GLYCOLAX/MIRALAX) powder MIX 17 GRAMS IN AT LEAST 8OZ OF WATER/JUICE AND DRINK ONCE OR TWICE DAILY 08/29/15   Rexene Alberts, MD  potassium chloride (K-DUR) 10 MEQ tablet Take 1 tablet (10 mEq total) by mouth daily. 06/19/15   Kinnie Feil, MD  predniSONE (DELTASONE) 10 MG tablet TAKE 1 TAB DAILY FOR 10 DAYS THEN TAKE 1/2 TAB DAILY FOR 10 DAYS THENTAKE 1/2 TAB EVERY OTHER DAY 02/27/16   Terald Sleeper, PA-C  rosuvastatin (CRESTOR) 10 MG tablet Take 10 mg by mouth at bedtime.     Historical Provider, MD  traZODone (DESYREL) 100 MG tablet Take 100 mg by mouth at bedtime.     Historical Provider, MD  Vitamin D, Ergocalciferol, (DRISDOL) 50000  UNITS CAPS Take 50,000 Units by mouth every Tuesday.     Historical Provider, MD  zolpidem (AMBIEN) 10 MG tablet Take 10 mg by mouth at bedtime.    Historical Provider, MD    Family History Family History  Problem Relation Age of Onset  . Heart attack Father   . Hypertension Father   . Diabetes Mother   . Hypertension Mother   . Skin cancer Brother     melanoma  . Colon cancer Neg Hx   . Esophageal cancer Neg Hx   . Rectal cancer Neg Hx   . Stomach cancer Neg Hx     Social History Social History  Substance Use Topics  . Smoking status: Former Smoker    Packs/day: 1.00    Years: 20.00    Types: Cigarettes    Start date: 10/12/1958    Quit date: 04/26/1988  . Smokeless tobacco: Never Used  . Alcohol use No     Allergies   Sulfamethoxazole   Review of Systems Review of Systems ROS: Statement: All systems negative except as marked or noted in the HPI; Constitutional: Negative for fever and chills. ; ; Eyes: Negative for eye pain, redness and discharge. ; ; ENMT: Negative for ear pain, hoarseness, nasal congestion, sinus pressure and sore throat. ; ; Cardiovascular: Negative for chest pain, palpitations, diaphoresis, dyspnea and peripheral edema. ; ; Respiratory: Negative for cough, wheezing and stridor. ; ; Gastrointestinal: +RUQ abd pain. Negative for nausea, vomiting, diarrhea, blood in stool, hematemesis, jaundice and rectal bleeding. . ; ; Genitourinary: Negative for dysuria, flank pain and hematuria. ; ; Musculoskeletal: +LBP. Negative for neck pain. Negative for swelling and trauma.; ; Skin: Negative for pruritus, rash, abrasions, blisters, bruising and skin lesion.; ; Neuro: Negative for headache, lightheadedness and neck stiffness. Negative for weakness, altered level of consciousness, altered mental status, extremity weakness, paresthesias, involuntary movement, seizure and syncope.      Physical Exam Updated Vital Signs BP 147/82   Pulse 106   Temp 98.8 F (37.1 C)  (Oral)   Resp 16   Ht 5\' 2"  (1.575 m)   Wt 129 lb (58.5 kg)   SpO2 99%   BMI 23.59 kg/m   Physical Exam 1600: Physical examination:  Nursing notes reviewed; Vital signs and O2 SAT reviewed;  Constitutional: Well  developed, Well nourished, Well hydrated, In no acute distress; Head:  Normocephalic, atraumatic; Eyes: EOMI, PERRL, No scleral icterus; ENMT: Mouth and pharynx normal, Mucous membranes moist; Neck: Supple, Full range of motion, No lymphadenopathy; Cardiovascular: Regular rate and rhythm, No gallop; Respiratory: Breath sounds clear & equal bilaterally, No wheezes.  Speaking full sentences with ease, Normal respiratory effort/excursion; Chest: Nontender, Movement normal; Abdomen: Soft, +RUQ tenderness to palp. Nondistended, Normal bowel sounds; Genitourinary: No CVA tenderness; Spine:  No midline CS, TS, LS tenderness. +TTP left lumbar paraspinal muscles.;; Extremities: Pulses normal, No tenderness, No edema, No calf edema or asymmetry.; Neuro: AA&Ox3, Major CN grossly intact.  Speech clear. No gross focal motor or sensory deficits in extremities. Strength 5/5 equal bilat UE's and LE's, including great toe dorsiflexion.  DTR 2/4 equal bilat UE's and LE's.  No gross sensory deficits.  Neg straight leg raises bilat.; Skin: Color normal, Warm, Dry.   ED Treatments / Results  Labs (all labs ordered are listed, but only abnormal results are displayed)   EKG  EKG Interpretation None       Radiology   Procedures Procedures (including critical care time)  Medications Ordered in ED Medications  morphine 2 MG/ML injection 2 mg (not administered)     Initial Impression / Assessment and Plan / ED Course  I have reviewed the triage vital signs and the nursing notes.  Pertinent labs & imaging results that were available during my care of the patient were reviewed by me and considered in my medical decision making (see chart for details).  MDM Reviewed: previous chart, nursing note  and vitals Reviewed previous: ultrasound, CT scan, labs and x-Jamika Interpretation: MRI and labs   Results for orders placed or performed during the hospital encounter of 03/01/16  Comprehensive metabolic panel  Result Value Ref Range   Sodium 132 (L) 135 - 145 mmol/L   Potassium 3.2 (L) 3.5 - 5.1 mmol/L   Chloride 101 101 - 111 mmol/L   CO2 23 22 - 32 mmol/L   Glucose, Bld 98 65 - 99 mg/dL   BUN 17 6 - 20 mg/dL   Creatinine, Ser 1.30 (H) 0.44 - 1.00 mg/dL   Calcium 8.7 (L) 8.9 - 10.3 mg/dL   Total Protein 6.3 (L) 6.5 - 8.1 g/dL   Albumin 3.4 (L) 3.5 - 5.0 g/dL   AST 14 (L) 15 - 41 U/L   ALT 6 (L) 14 - 54 U/L   Alkaline Phosphatase 59 38 - 126 U/L   Total Bilirubin 0.5 0.3 - 1.2 mg/dL   GFR calc non Af Amer 38 (L) >60 mL/min   GFR calc Af Amer 44 (L) >60 mL/min   Anion gap 8 5 - 15  Lipase, blood  Result Value Ref Range   Lipase 16 11 - 51 U/L  CBC with Differential  Result Value Ref Range   WBC 8.7 4.0 - 10.5 K/uL   RBC 4.10 3.87 - 5.11 MIL/uL   Hemoglobin 10.4 (L) 12.0 - 15.0 g/dL   HCT 33.1 (L) 36.0 - 46.0 %   MCV 80.7 78.0 - 100.0 fL   MCH 25.4 (L) 26.0 - 34.0 pg   MCHC 31.4 30.0 - 36.0 g/dL   RDW 17.2 (H) 11.5 - 15.5 %   Platelets 186 150 - 400 K/uL   Neutrophils Relative % 67 %   Neutro Abs 5.8 1.7 - 7.7 K/uL   Lymphocytes Relative 24 %   Lymphs Abs 2.1 0.7 - 4.0 K/uL  Monocytes Relative 8 %   Monocytes Absolute 0.7 0.1 - 1.0 K/uL   Eosinophils Relative 1 %   Eosinophils Absolute 0.1 0.0 - 0.7 K/uL   Basophils Relative 0 %   Basophils Absolute 0.0 0.0 - 0.1 K/uL   Mr Lumbar Spine Wo Contrast Result Date: 03/01/2016 CLINICAL DATA:  81 y/o  F; lower back pain and unable to walk. EXAM: MRI LUMBAR SPINE WITHOUT CONTRAST TECHNIQUE: Multiplanar, multisequence MR imaging of the lumbar spine was performed. No intravenous contrast was administered. COMPARISON:  None. FINDINGS: Segmentation:  Standard. Alignment:  Normal lumbar lordosis.  Grade 1 L5-S1 anterolisthesis.  Vertebrae: There is diffuse edema throughout the L5 and S1 vertebral bodies and increased signal within the intervertebral disc likely representing discitis osteomyelitis. The increased disc signal extends superiorly into the right anterior and right lateral epidural space at the L5 level (series 7: Image 33 and 6:10) as well as the prevertebral space to the left and anterior of the S1 vertebral body (6:13 and 7:40). Additionally, there is prevertebral edema extending from the L4 level overlying the sacrum in the presacral fat. Increased signal at the L4-5 intervertebral disc level may represent developing infection as well. Conus medullaris: Extends to the lower L1 level and appears normal. Paraspinal and other soft tissues: As above. Right kidney interpolar partially visualize cyst. Disc levels: L1-2: No significant disc displacement, foraminal narrowing, or canal stenosis. L2-3: Minimal disc bulge. No significant foraminal narrowing or canal stenosis. L3-4: Small disc bulge with mild facet and ligamentum flavum hypertrophy. No significant foraminal narrowing. Mild narrowing of lateral recesses and mild canal stenosis. L4-5: Moderate disc bulge with moderate facet and ligamentum flavum hypertrophy. Mild bilateral foraminal narrowing. Severe canal stenosis. L5-S1: Anterolisthesis with uncovered disc, extensive facet and ligamentum flavum hypertrophy, and fluid collection in the right anterior and lateral epidural space. Moderate to severe canal stenosis with crowding of cauda equina and severe bilateral foraminal narrowing. IMPRESSION: 1. L5-S1 discitis osteomyelitis. Right anterior and lateral epidural collection at the L5 level and presacral collection to the left and anterior of S1 vertebral body. Paraspinal inflammatory changes of soft tissues. The full extent of disease can be better characterized with a contrast examination. 2. The L5-S1 epidural collection and degenerative disease results in moderate to  severe canal stenosis with crowding of the cauda equina at the L5-S1 level. 3. Mild increased signal within the L4-5 intervertebral disc may represent early infection. 4. Additional degenerative changes of the spine resulting in severe L4-5 canal stenosis and severe foraminal narrowing at the L5-S1 level bilaterally. These results were called by telephone at the time of interpretation on 03/01/2016 at 7:31 pm to Dr. Francine Graven , who verbally acknowledged these results. Electronically Signed   By: Kristine Garbe M.D.   On: 03/01/2016 19:34    1605:  Pt stated she was here "so you can fix my back" and "make me walk." Pt endorses she has not been taking her pain meds at home "because I don't like pain medicine." Pt stated she "wanted to take ibuprofen," but her Ortho MD told her NOT to take it because she "was going to get an injection in my back." Pt again stated she "didn't like taking pain medicines."  I asked pt what her expectations of her ED visit today were if she did not want pain medications (yet wanted her pain addressed) and reminded her that the ED could not "fix" her back. Pt again stated she "didn't want pain medicine" but "wanted to  be out of pain" and "walk."  Workup ordered.   1615:  Pt refuses all treatment at this time and has called for her ride home. States she "already had a MRI in [place]" and "doesn't need" her gallbladder checked (which she told ED RN was her other complaint today). (There is no MRI in [facility] or that MRI Tech found on file.) I again offered workup and she refuses. I told pt was was confused regarding her complaints today, her expectations of her ED visit, and how she would like [place] to help her. Pt stated there "was nothing you all can do" and she "was leaving."   1820:  Pt's family is now here: They state another family called pt today and pt told them she "felt fine;" then pt called the ambulance a few hours later. Family apologizes for pt's behavior,  stating "she's stubborn," "this is the way she is" and "she even started to prep for surgery for her hernia and then on the way home got in a discussion with my sister and then cancelled the surgery over something my sister said to her that she didn't want to hear."  Family states my round-about discussion with pt is "the way she is." Family again profusely apologetic, states they will take her home. Family states pt is likely upset because they were going on a trip and did not invite her.   1830:  Family walked into exam room. Pt stood to leave with family and said, "oh never mind, I'll stay now and get checked out" and "I can't go home like this" (c/o LBP). Aware [place] is no longer here to evaluate her GB; states she "doesn't care about that" and "just wants my back checked." Pt also states now she will take pain medicine. Pt also apologetic regarding her previous behavior to myself and ED staff.  2035:  MRI as above. No neuro deficits, pt only c/o pain. Dx and testing d/w pt and family.  Questions answered.  Verb understanding, agreeable to transfer to [facility] for admit. T/C to [facility] Neurosurgery Dr. Cyndy Freeze, [place] discussed, including:  HPI, pertinent PM/SHx, VS/PE, dx testing, ED course and treatment:  He has viewed the MRI, start IV abx, admit to [facility], their service and ID can consult. T/C to [facility] Dr. Shanon Brow, [place] discussed, including:  HPI, pertinent PM/SHx, VS/PE, dx testing, ED course and treatment:  Agreeable to facilitate transfer to [facility] for admit, requests to write temporary orders, obtain medical bed to team [facility]/Dr. Alcario Drought.       Final Clinical Impressions(s) / ED Diagnoses   Final diagnoses:  None    New Prescriptions New Prescriptions   No medications on file     Francine Graven, DO 03/04/16 2044

## 2016-03-02 DIAGNOSIS — Z800 Family history of malignant neoplasm of digestive organs: Secondary | ICD-10-CM

## 2016-03-02 DIAGNOSIS — N189 Chronic kidney disease, unspecified: Secondary | ICD-10-CM

## 2016-03-02 DIAGNOSIS — Z933 Colostomy status: Secondary | ICD-10-CM

## 2016-03-02 DIAGNOSIS — Z808 Family history of malignant neoplasm of other organs or systems: Secondary | ICD-10-CM

## 2016-03-02 DIAGNOSIS — Z9049 Acquired absence of other specified parts of digestive tract: Secondary | ICD-10-CM

## 2016-03-02 DIAGNOSIS — Z87891 Personal history of nicotine dependence: Secondary | ICD-10-CM

## 2016-03-02 DIAGNOSIS — Z833 Family history of diabetes mellitus: Secondary | ICD-10-CM

## 2016-03-02 DIAGNOSIS — Z8719 Personal history of other diseases of the digestive system: Secondary | ICD-10-CM

## 2016-03-02 DIAGNOSIS — Z8249 Family history of ischemic heart disease and other diseases of the circulatory system: Secondary | ICD-10-CM

## 2016-03-02 LAB — BASIC METABOLIC PANEL
ANION GAP: 8 (ref 5–15)
BUN: 14 mg/dL (ref 6–20)
CALCIUM: 8.6 mg/dL — AB (ref 8.9–10.3)
CO2: 26 mmol/L (ref 22–32)
Chloride: 105 mmol/L (ref 101–111)
Creatinine, Ser: 1.3 mg/dL — ABNORMAL HIGH (ref 0.44–1.00)
GFR, EST AFRICAN AMERICAN: 44 mL/min — AB (ref >60)
GFR, EST NON AFRICAN AMERICAN: 38 mL/min — AB (ref >60)
GLUCOSE: 96 mg/dL (ref 65–99)
POTASSIUM: 3.4 mmol/L — AB (ref 3.5–5.1)
Sodium: 139 mmol/L (ref 135–145)

## 2016-03-02 LAB — CBC
HEMATOCRIT: 31.7 % — AB (ref 36.0–46.0)
HEMOGLOBIN: 9.9 g/dL — AB (ref 12.0–15.0)
MCH: 24.9 pg — ABNORMAL LOW (ref 26.0–34.0)
MCHC: 31.2 g/dL (ref 30.0–36.0)
MCV: 79.6 fL (ref 78.0–100.0)
Platelets: 169 10*3/uL (ref 150–400)
RBC: 3.98 MIL/uL (ref 3.87–5.11)
RDW: 17.4 % — ABNORMAL HIGH (ref 11.5–15.5)
WBC: 7 10*3/uL (ref 4.0–10.5)

## 2016-03-02 MED ORDER — VANCOMYCIN HCL IN DEXTROSE 750-5 MG/150ML-% IV SOLN
750.0000 mg | INTRAVENOUS | Status: DC
  Filled 2016-03-02: qty 150

## 2016-03-02 NOTE — Progress Notes (Addendum)
PROGRESS NOTE    [name]  [id number] DOB: [birth year] DOA: 03/01/2016 PCP: Terald Sleeper, PA-C  Brief Narrative: [name] is a 81 y.o. female with progressive low back pain, worse over the last three to four days.  She denies fevers or chills.  She complained of difficulty standing because of the pain.   Saw Ortho last week had MRI Came to ED at [facility] last Pm, MRI with L5-S1 discitis  Assessment & Plan:   Principal Problem:   Discitis of lumbar region L5- S1 -on empiric Abx- -ID Dr.Hatcher consulted, await ID input regarding CT guided aspiraton -Neurosurgery recommended medical Rx -pain control -PT eval   CAD s/p LAD stent in 2013 -Stable ,  Not on ASA due to high bleeding risk and anemia per Cards notes -continue metoprolol, Crestor, and Imdur.   Essential HTN:  -soft but stable, hold cozaar, continue metop and imdur   Paroxysmal atrial fibrillation: - Stable at present on metoprolol. -per Cards no longer on anticoagulation due to high bleeding risk, anemia, and falls risk.   Chronic diastolic heart failure:  -compensated on Po lasix, monitor  CKD 3 -stable  H/o perforated diverticulitis, status post partial colectomy and colostomy. -stable  DVT prophylaxis:SCDs, start lovenox if no procedure today Code Status:FUll Code Family Communication:None at bedside Disposition Plan:pending workup   Consultants:  ID NSG  Antimicrobials: Vanc/Zosyn  Subjective: Back pain, ok as long as in bed  Objective: Vitals:   03/01/16 2321 03/02/16 0155 03/02/16 0503 03/02/16 1010  BP: (!) 160/99 135/71 (!) 155/53 (!) 112/40  Pulse: 62 (!) 59 (!) 55 (!) 58  Resp: 18 20 20 18   Temp: 99.5 F (37.5 C) 99 F (37.2 C) 99.2 F (37.3 C) 99.2 F (37.3 C)  TempSrc: Oral Oral Oral Oral  SpO2: 98% 99% 99% 92%  Weight: 59.5 kg (131 lb 1.6 oz)     Height: 5\' 2"  (1.575 m)       Intake/Output Summary (Last 24 hours) at 03/02/16 1159 Last data filed at 03/02/16  0000  Gross per 24 hour  Intake              240 ml  Output                0 ml  Net              240 ml   Filed Weights   03/01/16 1548 03/01/16 2321  Weight: 58.5 kg (129 lb) 59.5 kg (131 lb 1.6 oz)    Examination:  General exam: Appears calm and comfortable  Respiratory system: Clear to auscultation. Respiratory effort normal. Cardiovascular system: S1 & S2 heard, RRR. No JVD, murmurs, rubs, gallops or clicks. No pedal edema. Gastrointestinal system: Abdomen is nondistended, soft and nontender. No organomegaly or masses felt. Normal bowel sounds heard. Central nervous system: Alert and oriented. No focal neurological deficits, pain with L leg movement Extremities: Symmetric 5 x 5 power. Skin: No rashes, lesions or ulcers Psychiatry: Judgement and insight appear normal. Mood & affect appropriate.     Data Reviewed: I have personally reviewed following labs and imaging studies  CBC:  Recent Labs Lab 03/01/16 1917 03/02/16 0540  WBC 8.7 7.0  NEUTROABS 5.8  --   HGB 10.4* 9.9*  HCT 33.1* 31.7*  MCV 80.7 79.6  PLT 186 [id number]   Basic Metabolic Panel:  Recent Labs Lab 03/01/16 1917 03/02/16 0540  NA 132* 139  K 3.2* 3.4*  CL  101 105  CO2 23 26  GLUCOSE 98 96  BUN 17 14  CREATININE 1.30* 1.30*  CALCIUM 8.7* 8.6*   GFR: Estimated Creatinine Clearance: 27.8 mL/min (by C-G formula based on SCr of 1.3 mg/dL (H)). Liver Function Tests:  Recent Labs Lab 03/01/16 1917  AST 14*  ALT 6*  ALKPHOS 59  BILITOT 0.5  PROT 6.3*  ALBUMIN 3.4*    Recent Labs Lab 03/01/16 1917  LIPASE 16   No results for input(s): AMMONIA in the last 168 hours. Coagulation Profile: No results for input(s): INR, PROTIME in the last 168 hours. Cardiac Enzymes: No results for input(s): CKTOTAL, CKMB, CKMBINDEX, TROPONINI in the last 168 hours. BNP (last 3 results) No results for input(s): PROBNP in the last 8760 hours. HbA1C: No results for input(s): HGBA1C in the last 72  hours. CBG: No results for input(s): GLUCAP in the last 168 hours. Lipid Profile: No results for input(s): CHOL, HDL, LDLCALC, TRIG, CHOLHDL, LDLDIRECT in the last 72 hours. Thyroid Function Tests: No results for input(s): TSH, T4TOTAL, FREET4, T3FREE, THYROIDAB in the last 72 hours. Anemia Panel: No results for input(s): VITAMINB12, FOLATE, FERRITIN, TIBC, IRON, RETICCTPCT in the last 72 hours. Urine analysis:    Component Value Date/Time   COLORURINE YELLOW 03/01/2016 2224   APPEARANCEUR CLEAR 03/01/2016 2224   LABSPEC 1.015 03/01/2016 2224   PHURINE 7.5 03/01/2016 2224   GLUCOSEU NEGATIVE 03/01/2016 2224   HGBUR NEGATIVE 03/01/2016 2224   BILIRUBINUR NEGATIVE 03/01/2016 2224   KETONESUR NEGATIVE 03/01/2016 2224   PROTEINUR NEGATIVE 03/01/2016 2224   UROBILINOGEN 0.2 02/10/2015 1936   NITRITE NEGATIVE 03/01/2016 2224   LEUKOCYTESUR NEGATIVE 03/01/2016 2224   Sepsis Labs: @LABRCNTIP (procalcitonin:4,lacticidven:4)  )No results found for this or any previous visit (from the past 240 hour(s)).       Radiology Studies: Mr Lumbar Spine Wo Contrast  Result Date: 03/01/2016 CLINICAL DATA:  81 y/o  F; lower back pain and unable to walk. EXAM: MRI LUMBAR SPINE WITHOUT CONTRAST TECHNIQUE: Multiplanar, multisequence MR imaging of the lumbar spine was performed. No intravenous contrast was administered. COMPARISON:  None. FINDINGS: Segmentation:  Standard. Alignment:  Normal lumbar lordosis.  Grade 1 L5-S1 anterolisthesis. Vertebrae: There is diffuse edema throughout the L5 and S1 vertebral bodies and increased signal within the intervertebral disc likely representing discitis osteomyelitis. The increased disc signal extends superiorly into the right anterior and right lateral epidural space at the L5 level (series 7: Image 33 and 6:10) as well as the prevertebral space to the left and anterior of the S1 vertebral body (6:13 and 7:40). Additionally, there is prevertebral edema extending  from the L4 level overlying the sacrum in the presacral fat. Increased signal at the L4-5 intervertebral disc level may represent developing infection as well. Conus medullaris: Extends to the lower L1 level and appears normal. Paraspinal and other soft tissues: As above. Right kidney interpolar partially visualize cyst. Disc levels: L1-2: No significant disc displacement, foraminal narrowing, or canal stenosis. L2-3: Minimal disc bulge. No significant foraminal narrowing or canal stenosis. L3-4: Small disc bulge with mild facet and ligamentum flavum hypertrophy. No significant foraminal narrowing. Mild narrowing of lateral recesses and mild canal stenosis. L4-5: Moderate disc bulge with moderate facet and ligamentum flavum hypertrophy. Mild bilateral foraminal narrowing. Severe canal stenosis. L5-S1: Anterolisthesis with uncovered disc, extensive facet and ligamentum flavum hypertrophy, and fluid collection in the right anterior and lateral epidural space. Moderate to severe canal stenosis with crowding of cauda equina and severe bilateral foraminal  narrowing. IMPRESSION: 1. L5-S1 discitis osteomyelitis. Right anterior and lateral epidural collection at the L5 level and presacral collection to the left and anterior of S1 vertebral body. Paraspinal inflammatory changes of soft tissues. The full extent of disease can be better characterized with a contrast examination. 2. The L5-S1 epidural collection and degenerative disease results in moderate to severe canal stenosis with crowding of the cauda equina at the L5-S1 level. 3. Mild increased signal within the L4-5 intervertebral disc may represent early infection. 4. Additional degenerative changes of the spine resulting in severe L4-5 canal stenosis and severe foraminal narrowing at the L5-S1 level bilaterally. These results were called by telephone at the time of interpretation on 03/01/2016 at 7:31 pm to Dr. Francine Graven , who verbally acknowledged these  results. Electronically Signed   By: Kristine Garbe M.D.   On: 03/01/2016 19:34        Scheduled Meds: . sodium chloride   Intravenous STAT  . citalopram  20 mg Oral QHS  . furosemide  20 mg Oral Daily  . hydrALAZINE  50 mg Oral TID  . isosorbide mononitrate  30 mg Oral Daily  . losartan  100 mg Oral Daily  . metoprolol succinate  25 mg Oral Daily  . piperacillin-tazobactam (ZOSYN)  IV  3.375 g Intravenous Q8H  . potassium chloride  10 mEq Oral Daily  . rosuvastatin  10 mg Oral QHS  . sodium chloride flush  3 mL Intravenous Q12H  . traZODone  100 mg Oral QHS  . [START ON 03/03/2016] vancomycin  750 mg Intravenous Q24H  . zolpidem  5 mg Oral QHS   Continuous Infusions:   LOS: 1 day    Time spent: 66min    Domenic Polite, MD [facility] Pager [phone]  If 7PM-7AM, please contact night-coverage [email or web address] Password [email or web address] 03/02/2016, 11:59 AM

## 2016-03-02 NOTE — Progress Notes (Signed)
Pt. transported from [facility] and arrived to 1M-08 via stretcher; alert and oriented x4; pt. oriented to room and hospital policies; has a watch and glasses and pocketbook at bedside. Informed pt. about hospital policy on valuables.

## 2016-03-02 NOTE — Progress Notes (Signed)
Pt. Informed that she's NPO at [place] and why.

## 2016-03-02 NOTE — Progress Notes (Signed)
Pharmacy Antibiotic Note  [name] is a 81 y.o. female admitted on 03/01/2016 with discitis/epidural abscess.  Pharmacy has been consulted for vanc/zosyn dosing. 81 yo who presented to [facility] with several months of back pain. MRI showed discitis and epidural abscess. Therefore she was tx to [facility] for management. She got a dose of vanc/zosyn before transfer.  Plan: Vanc 1g x1 then 750mg  q24 Zosyn 3.375g IV q8  Height: 5\' 2"  (157.5 cm) Weight: 131 lb 1.6 oz (59.5 kg) IBW/kg (Calculated) : 50.1  Temp (24hrs), Avg:98.9 F (37.2 C), Min:98.1 F (36.7 C), Max:99.5 F (37.5 C)   Recent Labs Lab 03/01/16 1917 03/02/16 0540  WBC 8.7 7.0  CREATININE 1.30* 1.30*    Estimated Creatinine Clearance: 27.8 mL/min (by C-G formula based on SCr of 1.3 mg/dL (H)).    Allergies  Allergen Reactions  . Sulfamethoxazole Rash    Antimicrobials this admission: 12/2 vanc >>  12/2 zosyn >>   Dose adjustments this admission:   Microbiology results:  BCx:  12/1 UCx:    Sputum:  MRSA PCR:   Onnie Boer, PharmD, BCPS, AAHIVP, CPP Infectious Disease Pharmacist Pager: [phone] 03/02/2016 8:05 AM

## 2016-03-02 NOTE — Progress Notes (Signed)
Paged Dr. Cyndy Freeze to inform him pt. arrived from [facility] per order, and he RTC; text paged [facility] admissions for hospitalists to inform them pt. had arrived with orders.

## 2016-03-02 NOTE — Consult Note (Signed)
CC:  Chief Complaint  Patient presents with  . Back Pain    HPI: [name] is a 81 y.o. female with progressive low back pain, worse over the last three to four days.  She denies fevers or chills.  She complains of difficulty standing because of the pain.  She denies weakness or numbness in her lower extremities.  She denies bowel or bladder dysfunction.  PMH: Past Medical History:  Diagnosis Date  . Anemia   . Anxiety state, unspecified   . Atrial fibrillation with RVR ([place])   . [name]'s esophagus   . Bilateral sciatica   . Cellulitis of extremity   . Chronic back pain   . Congestive heart failure, unspecified    diastolic heart failure  . Coronary artery disease    a. Moderate to severe coronary disease involving the left anterior descending artery and right coronary artery.  Sequential stenosis in the LAD is significant.  Right coronary artery is moderate to severely likely nonischemic. Questionable small LVOT obstruction.  No Brockenbrough maneuver was performed.  Catheterization January 2013; b. 05/2014 MV no isch/infarct, EF 60%.  . Depressive disorder, not elsewhere classified   . Diverticulitis   . Erosive esophagitis 08/04/2015  . Esophageal reflux   . Gastric volvulus 08/04/2015  . Gastritis   . H/O hiatal hernia   . Hypertension   . IBS (irritable bowel syndrome)   . Infection of esophagostomy ([place])   . Macular degeneration   . Melanoma ([place])    removal on back   . Pelvic abscess in female   . Perforated bowel ([place])   . Perforated sigmoid colon ([place])   . PONV (postoperative nausea and vomiting)   . Pulmonary hypertension    PA systolic pressure [id number] mmHg by echocardiogram, PA pressure 33/10 by cardiac catheterization PA saturation 62% thermodilution cardiac index 2.0 thick cardiac index 2.4  . Pure hypercholesterolemia   . Tricuspid regurgitation    moderate tricuspid regurgitation by echocardiogram, prior use of anorexic agents    PSH: Past Surgical History:   Procedure Laterality Date  . CARDIAC CATHETERIZATION Left 03/05/2015   Procedure: CENTRAL LINE INSERTION;  Surgeon: Aviva Signs, MD;  Location: [facility];  Service: General;  Laterality: Left;  . CATARACT EXTRACTION W/ INTRAOCULAR LENS  IMPLANT, BILATERAL  ~ 2002  . COLECTOMY WITH COLOSTOMY CREATION/HARTMANN PROCEDURE N/A 03/05/2015   Procedure: PARTIAL COLECTOMY WITH COLOSTOMY CREATION/HARTMANN PROCEDURE;  Surgeon: Aviva Signs, MD;  Location: [facility];  Service: General;  Laterality: N/A;  . CORONARY ANGIOPLASTY WITH STENT PLACEMENT  05/08/11   "1"  . ESOPHAGEAL MANOMETRY N/A 06/06/2014   Procedure: ESOPHAGEAL MANOMETRY (EM);  Surgeon: Jerene Bears, MD;  Location: [facility];  Service: Gastroenterology;  Laterality: N/A;  . ESOPHAGOGASTRODUODENOSCOPY (EGD) WITH PROPOFOL N/A 08/04/2015   Procedure: ESOPHAGOGASTRODUODENOSCOPY (EGD) WITH PROPOFOL;  Surgeon: Mauri Pole, MD;  Location: [facility];  Service: Endoscopy;  Laterality: N/A;  . HERNIA REPAIR    . LAPAROSCOPIC NISSEN FUNDOPLICATION N/A [id number]   Procedure: LAPAROSCOPIC  LYSIS OF ADHESIONS, SEGMENTAL GASTRECTOMY, PARTIAL REDUCTION OF HERNIA;  Surgeon: [name], MD;  Location: [facility];  Service: General;  Laterality: N/A;  . NISSEN FUNDOPLICATION  [id number]  . PERCUTANEOUS CORONARY STENT INTERVENTION (PCI-S) N/A 05/08/2011   Procedure: PERCUTANEOUS CORONARY STENT INTERVENTION (PCI-S);  Surgeon: Wellington Hampshire, MD;  Location: [facility] CATH LAB;  Service: Cardiovascular;  Laterality: N/A;  . ROTATOR CUFF REPAIR  ~ 2001   right  . SHOULDER ARTHROSCOPY  ~  2004; 2005   left; "joint's wore out"  . TONSILLECTOMY  ~ 1944  . TUBAL LIGATION  1972    SH: Social History  Substance Use Topics  . Smoking status: Former Smoker    Packs/day: 1.00    Years: 20.00    Types: Cigarettes    Start date: 10/12/1958    Quit date: 04/26/1988  . Smokeless tobacco: Never Used  . Alcohol use No    MEDS: Prior to Admission medications   Medication Sig  Start Date End Date Taking? Authorizing Provider  albuterol (PROVENTIL HFA;VENTOLIN HFA) 108 (90 Base) MCG/ACT inhaler Inhale 2 puffs into the lungs every 6 (six) hours as needed for wheezing or shortness of breath.    Yes Historical Provider, MD  ALPRAZolam (XANAX) 0.25 MG tablet Take 1 tablet (0.25 mg total) by mouth 2 (two) times daily as needed for anxiety. 05/08/15  Yes Theodis Blaze, MD  citalopram (CELEXA) 40 MG tablet Take 20 mg by mouth at bedtime.    Yes Historical Provider, MD  esomeprazole (NEXIUM) 40 MG capsule Take 40 mg by mouth daily before breakfast.    Yes Historical Provider, MD  ferrous sulfate 325 (65 FE) MG EC tablet Take 325 mg by mouth daily.    Yes Historical Provider, MD  furosemide (LASIX) 20 MG tablet Take 1 tablet (20 mg total) by mouth daily. 06/19/15  Yes Kinnie Feil, MD  hydrALAZINE (APRESOLINE) 50 MG tablet Take 1 tablet (50 mg total) by mouth 3 (three) times daily. 02/12/16  Yes Terald Sleeper, PA-C  isosorbide mononitrate (IMDUR) 30 MG 24 hr tablet Take 30 mg by mouth daily. 07/21/15  Yes Historical Provider, MD  loratadine (CLARITIN) 10 MG tablet Take 10 mg by mouth daily.   Yes Historical Provider, MD  losartan (COZAAR) 100 MG tablet Take 1 tablet (100 mg total) by mouth daily. 11/11/12  Yes Herminio Commons, MD  metoprolol succinate (TOPROL-XL) 25 MG 24 hr tablet Take 1 tablet (25 mg total) by mouth daily. 08/29/15  Yes Rexene Alberts, MD  Multiple Vitamins-Minerals (PRESERVISION AREDS) TABS Take 1 tablet by mouth 2 (two) times daily.    Yes Historical Provider, MD  nitroGLYCERIN (NITROSTAT) 0.4 MG SL tablet Place 0.4 mg under the tongue every 5 (five) minutes as needed for chest pain. Reported on 04/26/2015   Yes Historical Provider, MD  polyethylene glycol powder (GLYCOLAX/MIRALAX) powder MIX 17 GRAMS IN AT LEAST 8OZ OF WATER/JUICE AND DRINK ONCE OR TWICE DAILY 08/29/15  Yes Rexene Alberts, MD  potassium chloride (K-DUR) 10 MEQ tablet Take 1 tablet (10 mEq total)  by mouth daily. 06/19/15  Yes Kinnie Feil, MD  rosuvastatin (CRESTOR) 10 MG tablet Take 10 mg by mouth at bedtime.    Yes Historical Provider, MD  traZODone (DESYREL) 100 MG tablet Take 100 mg by mouth at bedtime.    Yes Historical Provider, MD  Vitamin D, Ergocalciferol, (DRISDOL) 50000 UNITS CAPS Take 50,000 Units by mouth every Tuesday.    Yes Historical Provider, MD  zolpidem (AMBIEN) 10 MG tablet Take 10 mg by mouth at bedtime.   Yes Historical Provider, MD  acetaminophen-codeine (TYLENOL #3) 300-30 MG tablet Take 1 tablet by mouth every 6 (six) hours as needed for moderate pain. Patient not taking: Reported on 03/01/2016 01/15/16   Terald Sleeper, PA-C  amoxicillin (AMOXIL) 500 MG capsule Take 1 capsule (500 mg total) by mouth 3 (three) times daily. Patient not taking: Reported on 03/01/2016 02/12/16   Glenard Haring  Adah Salvage, PA-C  predniSONE (DELTASONE) 10 MG tablet TAKE 1 TAB DAILY FOR 10 DAYS THEN TAKE 1/2 TAB DAILY FOR 10 DAYS THENTAKE 1/2 TAB EVERY OTHER DAY Patient not taking: Reported on 03/01/2016 02/27/16   Terald Sleeper, PA-C    ALLERGY: Allergies  Allergen Reactions  . Sulfamethoxazole Rash    ROS: Review of Systems  Constitutional: Negative.   HENT: Negative.   Eyes: Negative.   Respiratory: Negative.   Cardiovascular: Negative.   Gastrointestinal: Negative.   Genitourinary: Negative.   Musculoskeletal: Positive for back pain.  Skin: Negative.   Neurological: Negative.   Endo/Heme/Allergies: Negative.     NEUROLOGIC EXAM: Awake, alert, oriented Memory and concentration grossly intact Speech fluent, appropriate CN grossly intact Motor exam: Upper Extremities Deltoid Bicep Tricep Grip  Right 5/5 5/5 5/5 5/5  Left 5/5 5/5 5/5 5/5   Lower Extremity IP Quad PF DF EHL  Right 5/5 5/5 5/5 5/5 5/5  Left 5/5 5/5 5/5 5/5 5/5   Sensation grossly intact to LT  IMAGING: I have independently reviewed her MRI.  She has spondylodiscitis at L5-S1 with a small component of an  epidural abscess.  IMPRESSION: - 81 y.o. female with spondylodiscitis at L5-S1 and a small epidural abscess.  She is entirely neurologically intact.  No role for surgery.  PLAN: - Empiric antibiotic treatment; may consider consulting ID for selection and duration - If you need a culture would recommend CT guided biopsy - Will sign off, feel free to call with questions.

## 2016-03-02 NOTE — Plan of Care (Signed)
Problem: Education: Goal: Knowledge of [facility] Education information/materials will improve Outcome: Progressing POC reviewed with pt.   

## 2016-03-02 NOTE — Consult Note (Signed)
[facility]  Date of Admission:  03/01/2016  Date of Consult:  03/02/2016  Reason for Consult: Diskitis Referring Physician: Broadus John  Impression/Recommendation Diskitis Stop anbx until she has aspirate IR aspirate Await BCx   CKD Slightly worse vs previous. Follow Cr  Comment- Hopefully can get sample to guide her anbx before committing her to prolonged empiric course.  Anaerobes are not a common cause of epidural abscess. She does not need zosyn.  Not clear if this is related to her hospitalization in Dec for colonic rupture.    Thank you so much for this interesting consult,   Bobby Rumpf (pager) [phone] [email or web address]  [name] is an 81 y.o. female.  HPI: 81 yo F with hx of perforated diverticulitis and colectomy (03-2015), adm for anemia 07-2015, and a prev R colon mass. She has now had back pain over several months (July?). She had worsening over the last 2 days and came to hospital. She has had no f/c. No bowel or bladder dysfunction.  She came to ED and was afebrile, but found on MRI to have: 1. L5-S1 discitis osteomyelitis. Right anterior and lateral epidural collection at the L5 level and presacral collection to the left and anterior of S1 vertebral body. Paraspinal inflammatory changes of soft tissues. The full extent of disease can be better characterized with a contrast examination. 2. The L5-S1 epidural collection and degenerative disease results in moderate to severe canal stenosis with crowding of the cauda equina at the L5-S1 level. 3. Mild increased signal within the L4-5 intervertebral disc may represent early infection. 4. Additional degenerative changes of the spine resulting in severe L4-5 canal stenosis and severe foraminal narrowing at the L5-S1 level bilaterally.  She was started on vanco/zosyn today, seen by neurosurgery.   She has been hemodynamically stable and afebrile in hospital.   Past  Medical History:  Diagnosis Date  . Anemia   . Anxiety state, unspecified   . Atrial fibrillation with RVR ([place])   . [name]'s esophagus   . Bilateral sciatica   . Cellulitis of extremity   . Chronic back pain   . Congestive heart failure, unspecified    diastolic heart failure  . Coronary artery disease    a. Moderate to severe coronary disease involving the left anterior descending artery and right coronary artery.  Sequential stenosis in the LAD is significant.  Right coronary artery is moderate to severely likely nonischemic. Questionable small LVOT obstruction.  No Brockenbrough maneuver was performed.  Catheterization January 2013; b. 05/2014 MV no isch/infarct, EF 60%.  . Depressive disorder, not elsewhere classified   . Diverticulitis   . Erosive esophagitis 08/04/2015  . Esophageal reflux   . Gastric volvulus 08/04/2015  . Gastritis   . H/O hiatal hernia   . Hypertension   . IBS (irritable bowel syndrome)   . Infection of esophagostomy ([place])   . Macular degeneration   . Melanoma ([place])    removal on back   . Pelvic abscess in female   . Perforated bowel ([place])   . Perforated sigmoid colon ([place])   . PONV (postoperative nausea and vomiting)   . Pulmonary hypertension    PA systolic pressure 98-33 mmHg by echocardiogram, PA pressure 33/10 by cardiac catheterization PA saturation 62% thermodilution cardiac index 2.0 thick cardiac index 2.4  . Pure hypercholesterolemia   . Tricuspid regurgitation    moderate tricuspid regurgitation by echocardiogram, prior use of anorexic agents    Past Surgical History:  Procedure Laterality Date  . CARDIAC CATHETERIZATION Left 03/05/2015   Procedure: CENTRAL LINE INSERTION;  Surgeon: Aviva Signs, MD;  Location: [facility];  Service: General;  Laterality: Left;  . CATARACT EXTRACTION W/ INTRAOCULAR LENS  IMPLANT, BILATERAL  ~ 2002  . COLECTOMY WITH COLOSTOMY CREATION/HARTMANN PROCEDURE N/A 03/05/2015   Procedure: PARTIAL COLECTOMY WITH COLOSTOMY  CREATION/HARTMANN PROCEDURE;  Surgeon: Aviva Signs, MD;  Location: [facility];  Service: General;  Laterality: N/A;  . CORONARY ANGIOPLASTY WITH STENT PLACEMENT  05/08/11   "1"  . ESOPHAGEAL MANOMETRY N/A 06/06/2014   Procedure: ESOPHAGEAL MANOMETRY (EM);  Surgeon: Jerene Bears, MD;  Location: [facility];  Service: Gastroenterology;  Laterality: N/A;  . ESOPHAGOGASTRODUODENOSCOPY (EGD) WITH PROPOFOL N/A 08/04/2015   Procedure: ESOPHAGOGASTRODUODENOSCOPY (EGD) WITH PROPOFOL;  Surgeon: Mauri Pole, MD;  Location: [facility];  Service: Endoscopy;  Laterality: N/A;  . HERNIA REPAIR    . LAPAROSCOPIC NISSEN FUNDOPLICATION N/A 1/58/3094   Procedure: LAPAROSCOPIC  LYSIS OF ADHESIONS, SEGMENTAL GASTRECTOMY, PARTIAL REDUCTION OF HERNIA;  Surgeon: [name], MD;  Location: [facility];  Service: General;  Laterality: N/A;  . NISSEN FUNDOPLICATION  0768  . PERCUTANEOUS CORONARY STENT INTERVENTION (PCI-S) N/A 05/08/2011   Procedure: PERCUTANEOUS CORONARY STENT INTERVENTION (PCI-S);  Surgeon: Wellington Hampshire, MD;  Location: [facility];  Service: Cardiovascular;  Laterality: N/A;  . ROTATOR CUFF REPAIR  ~ 2001   right  . SHOULDER ARTHROSCOPY  ~ 2004; 2005   left; "joint's wore out"  . TONSILLECTOMY  ~ 1944  . TUBAL LIGATION  1972     Allergies  Allergen Reactions  . Sulfamethoxazole Rash    Medications:  Scheduled: . citalopram  20 mg Oral QHS  . furosemide  20 mg Oral Daily  . hydrALAZINE  50 mg Oral TID  . isosorbide mononitrate  30 mg Oral Daily  . metoprolol succinate  25 mg Oral Daily  . potassium chloride  10 mEq Oral Daily  . rosuvastatin  10 mg Oral QHS  . sodium chloride flush  3 mL Intravenous Q12H  . traZODone  100 mg Oral QHS  . zolpidem  5 mg Oral QHS    Abtx:  Anti-infectives    Start     Dose/Rate Route Frequency Ordered Stop   03/03/16 0100  vancomycin (VANCOCIN) IVPB 750 mg/150 ml premix     750 mg 150 mL/hr over 60 Minutes Intravenous Every 24 hours 03/02/16 0819      03/02/16 0600  piperacillin-tazobactam (ZOSYN) IVPB 3.375 g     3.375 g 12.5 mL/hr over 240 Minutes Intravenous Every 8 hours 03/01/16 2201     03/01/16 2215  piperacillin-tazobactam (ZOSYN) IVPB 3.375 g     3.375 g 100 mL/hr over 30 Minutes Intravenous STAT 03/01/16 2201 03/01/16 2245   03/01/16 2215  vancomycin (VANCOCIN) IVPB 1000 mg/200 mL premix     1,000 mg 200 mL/hr over 60 Minutes Intravenous  Once 03/01/16 2203 03/02/16 0218      Total days of antibiotics: 0 vanco/zosyn          Social History:  reports that she quit smoking about 27 years ago. Her smoking use included Cigarettes. She started smoking about 57 years ago. She has a 20.00 pack-year smoking history. She has never used smokeless tobacco. She reports that she does not drink alcohol or use drugs.  Family History  Problem Relation Age of Onset  . Heart attack Father   . Hypertension Father   . Diabetes Mother   .  Hypertension Mother   . Skin cancer Brother     melanoma  . Colon cancer Neg Hx   . Esophageal cancer Neg Hx   . Rectal cancer Neg Hx   . Stomach cancer Neg Hx     General ROS: no change in colostomy output, no dysuria, normal strength, no change in sensation, no f/c. see HPI.   Blood pressure (!) 106/43, pulse 66, temperature 99.2 F (37.3 C), temperature source Oral, resp. rate 20, height 5' 2"  (1.575 m), weight 59.5 kg (131 lb 1.6 oz), SpO2 96 %. General appearance: alert, cooperative and no distress Eyes: negative findings: pupils equal, round, reactive to light and accomodation Neck: no adenopathy and supple, symmetrical, trachea midline Lungs: clear to auscultation bilaterally Heart: regular rate and rhythm Abdomen: normal findings: bowel sounds normal, soft, non-tender and LLQ ostomy Neurologic: Grossly normal  Normal strength and sensation in BLE.    Results for orders placed or performed during the hospital encounter of 03/01/16 (from the past 48 hour(s))  Comprehensive metabolic  panel     Status: Abnormal   Collection Time: 03/01/16  7:17 PM  Result Value Ref Range   Sodium 132 (L) 135 - 145 mmol/L   Potassium 3.2 (L) 3.5 - 5.1 mmol/L   Chloride 101 101 - 111 mmol/L   CO2 23 22 - 32 mmol/L   Glucose, Bld 98 65 - 99 mg/dL   BUN 17 6 - 20 mg/dL   Creatinine, Ser 1.30 (H) 0.44 - 1.00 mg/dL   Calcium 8.7 (L) 8.9 - 10.3 mg/dL   Total Protein 6.3 (L) 6.5 - 8.1 g/dL   Albumin 3.4 (L) 3.5 - 5.0 g/dL   AST 14 (L) 15 - 41 U/L   ALT 6 (L) 14 - 54 U/L   Alkaline Phosphatase 59 38 - 126 U/L   Total Bilirubin 0.5 0.3 - 1.2 mg/dL   GFR calc non Af Amer 38 (L) >60 mL/min   GFR calc Af Amer 44 (L) >60 mL/min    Comment: (NOTE) The eGFR has been calculated using the CKD EPI equation. This calculation has not been validated in all clinical situations. eGFR's persistently <60 mL/min signify possible Chronic Kidney Disease.    Anion gap 8 5 - 15  Lipase, blood     Status: None   Collection Time: 03/01/16  7:17 PM  Result Value Ref Range   Lipase 16 11 - 51 U/L  CBC with Differential     Status: Abnormal   Collection Time: 03/01/16  7:17 PM  Result Value Ref Range   WBC 8.7 4.0 - 10.5 K/uL   RBC 4.10 3.87 - 5.11 MIL/uL   Hemoglobin 10.4 (L) 12.0 - 15.0 g/dL   HCT 33.1 (L) 36.0 - 46.0 %   MCV 80.7 78.0 - 100.0 fL   MCH 25.4 (L) 26.0 - 34.0 pg   MCHC 31.4 30.0 - 36.0 g/dL   RDW 17.2 (H) 11.5 - 15.5 %   Platelets 186 150 - 400 K/uL   Neutrophils Relative % 67 %   Neutro Abs 5.8 1.7 - 7.7 K/uL   Lymphocytes Relative 24 %   Lymphs Abs 2.1 0.7 - 4.0 K/uL   Monocytes Relative 8 %   Monocytes Absolute 0.7 0.1 - 1.0 K/uL   Eosinophils Relative 1 %   Eosinophils Absolute 0.1 0.0 - 0.7 K/uL   Basophils Relative 0 %   Basophils Absolute 0.0 0.0 - 0.1 K/uL  Urinalysis, Routine w reflex microscopic  Status: None   Collection Time: 03/01/16 10:24 PM  Result Value Ref Range   Color, Urine YELLOW YELLOW   APPearance CLEAR CLEAR   Specific Gravity, Urine 1.015 1.005 -  1.030   pH 7.5 5.0 - 8.0   Glucose, UA NEGATIVE NEGATIVE mg/dL   Hgb urine dipstick NEGATIVE NEGATIVE   Bilirubin Urine NEGATIVE NEGATIVE   Ketones, ur NEGATIVE NEGATIVE mg/dL   Protein, ur NEGATIVE NEGATIVE mg/dL   Nitrite NEGATIVE NEGATIVE   Leukocytes, UA NEGATIVE NEGATIVE    Comment: MICROSCOPIC NOT DONE ON URINES WITH NEGATIVE PROTEIN, BLOOD, LEUKOCYTES, NITRITE, OR GLUCOSE <1000 mg/dL.  Basic metabolic panel     Status: Abnormal   Collection Time: 03/02/16  5:40 AM  Result Value Ref Range   Sodium 139 135 - 145 mmol/L   Potassium 3.4 (L) 3.5 - 5.1 mmol/L   Chloride 105 101 - 111 mmol/L   CO2 26 22 - 32 mmol/L   Glucose, Bld 96 65 - 99 mg/dL   BUN 14 6 - 20 mg/dL   Creatinine, Ser 1.30 (H) 0.44 - 1.00 mg/dL   Calcium 8.6 (L) 8.9 - 10.3 mg/dL   GFR calc non Af Amer 38 (L) >60 mL/min   GFR calc Af Amer 44 (L) >60 mL/min    Comment: (NOTE) The eGFR has been calculated using the CKD EPI equation. This calculation has not been validated in all clinical situations. eGFR's persistently <60 mL/min signify possible Chronic Kidney Disease.    Anion gap 8 5 - 15  CBC     Status: Abnormal   Collection Time: 03/02/16  5:40 AM  Result Value Ref Range   WBC 7.0 4.0 - 10.5 K/uL   RBC 3.98 3.87 - 5.11 MIL/uL   Hemoglobin 9.9 (L) 12.0 - 15.0 g/dL   HCT 31.7 (L) 36.0 - 46.0 %   MCV 79.6 78.0 - 100.0 fL   MCH 24.9 (L) 26.0 - 34.0 pg   MCHC 31.2 30.0 - 36.0 g/dL   RDW 17.4 (H) 11.5 - 15.5 %   Platelets 169 150 - 400 K/uL      Component Value Date/Time   SDES URINE, CLEAN CATCH 08/03/2015 1403   SPECREQUEST NONE 08/03/2015 1403   CULT MULTIPLE SPECIES PRESENT, SUGGEST RECOLLECTION (A) 08/03/2015 1403   REPTSTATUS 08/05/2015 FINAL 08/03/2015 1403   Mr Lumbar Spine Wo Contrast  Result Date: 03/01/2016 CLINICAL DATA:  81 y/o  F; lower back pain and unable to walk. EXAM: MRI LUMBAR SPINE WITHOUT CONTRAST TECHNIQUE: Multiplanar, multisequence MR imaging of the lumbar spine was  performed. No intravenous contrast was administered. COMPARISON:  None. FINDINGS: Segmentation:  Standard. Alignment:  Normal lumbar lordosis.  Grade 1 L5-S1 anterolisthesis. Vertebrae: There is diffuse edema throughout the L5 and S1 vertebral bodies and increased signal within the intervertebral disc likely representing discitis osteomyelitis. The increased disc signal extends superiorly into the right anterior and right lateral epidural space at the L5 level (series 7: Image 33 and 6:10) as well as the prevertebral space to the left and anterior of the S1 vertebral body (6:13 and 7:40). Additionally, there is prevertebral edema extending from the L4 level overlying the sacrum in the presacral fat. Increased signal at the L4-5 intervertebral disc level may represent developing infection as well. Conus medullaris: Extends to the lower L1 level and appears normal. Paraspinal and other soft tissues: As above. Right kidney interpolar partially visualize cyst. Disc levels: L1-2: No significant disc displacement, foraminal narrowing, or canal stenosis. L2-3: Minimal  disc bulge. No significant foraminal narrowing or canal stenosis. L3-4: Small disc bulge with mild facet and ligamentum flavum hypertrophy. No significant foraminal narrowing. Mild narrowing of lateral recesses and mild canal stenosis. L4-5: Moderate disc bulge with moderate facet and ligamentum flavum hypertrophy. Mild bilateral foraminal narrowing. Severe canal stenosis. L5-S1: Anterolisthesis with uncovered disc, extensive facet and ligamentum flavum hypertrophy, and fluid collection in the right anterior and lateral epidural space. Moderate to severe canal stenosis with crowding of cauda equina and severe bilateral foraminal narrowing. IMPRESSION: 1. L5-S1 discitis osteomyelitis. Right anterior and lateral epidural collection at the L5 level and presacral collection to the left and anterior of S1 vertebral body. Paraspinal inflammatory changes of soft  tissues. The full extent of disease can be better characterized with a contrast examination. 2. The L5-S1 epidural collection and degenerative disease results in moderate to severe canal stenosis with crowding of the cauda equina at the L5-S1 level. 3. Mild increased signal within the L4-5 intervertebral disc may represent early infection. 4. Additional degenerative changes of the spine resulting in severe L4-5 canal stenosis and severe foraminal narrowing at the L5-S1 level bilaterally. These results were called by telephone at the time of interpretation on 03/01/2016 at 7:31 pm to Dr. Francine Graven , who verbally acknowledged these results. Electronically Signed   By: Kristine Garbe M.D.   On: 03/01/2016 19:34   No results found for this or any previous visit (from the past 240 hour(s)).    03/02/2016, 5:15 PM     LOS: 1 day    Records and images were personally reviewed where available.

## 2016-03-03 LAB — BASIC METABOLIC PANEL
ANION GAP: 6 (ref 5–15)
BUN: 15 mg/dL (ref 6–20)
CALCIUM: 8.4 mg/dL — AB (ref 8.9–10.3)
CO2: 25 mmol/L (ref 22–32)
Chloride: 105 mmol/L (ref 101–111)
Creatinine, Ser: 1.32 mg/dL — ABNORMAL HIGH (ref 0.44–1.00)
GFR, EST AFRICAN AMERICAN: 43 mL/min — AB (ref >60)
GFR, EST NON AFRICAN AMERICAN: 37 mL/min — AB (ref >60)
Glucose, Bld: 105 mg/dL — ABNORMAL HIGH (ref 65–99)
POTASSIUM: 3.8 mmol/L (ref 3.5–5.1)
SODIUM: 136 mmol/L (ref 135–145)

## 2016-03-03 LAB — CBC
HEMATOCRIT: 28 % — AB (ref 36.0–46.0)
HEMOGLOBIN: 8.7 g/dL — AB (ref 12.0–15.0)
MCH: 25.1 pg — ABNORMAL LOW (ref 26.0–34.0)
MCHC: 31.1 g/dL (ref 30.0–36.0)
MCV: 80.9 fL (ref 78.0–100.0)
Platelets: 157 10*3/uL (ref 150–400)
RBC: 3.46 MIL/uL — AB (ref 3.87–5.11)
RDW: 17.4 % — ABNORMAL HIGH (ref 11.5–15.5)
WBC: 4.9 10*3/uL (ref 4.0–10.5)

## 2016-03-03 MED ORDER — POLYETHYLENE GLYCOL 3350 17 G PO PACK
17.0000 g | PACK | Freq: Every day | ORAL | Status: DC
  Administered 2016-03-03 – 2016-03-07 (×5): 17 g via ORAL
  Filled 2016-03-03 (×5): qty 1

## 2016-03-03 MED ORDER — ENOXAPARIN SODIUM 40 MG/0.4ML ~~LOC~~ SOLN
40.0000 mg | SUBCUTANEOUS | Status: AC
  Administered 2016-03-03: 40 mg via SUBCUTANEOUS
  Filled 2016-03-03: qty 0.4

## 2016-03-03 NOTE — Progress Notes (Signed)
INFECTIOUS DISEASE PROGRESS NOTE  ID: [name] is a 81 y.o. female with  Principal Problem:   Discitis of lumbar region L5- S1 Active Problems:   HTN (hypertension)   Coronary artery disease   CKD (chronic kidney disease) stage 3, GFR 30-59 ml/min   PAF (paroxysmal atrial fibrillation) (HCC)   Epidural abscess L5- S1   Back pain, acute   Chronic diastolic CHF (congestive heart failure) (HCC)  Subjective: Feels better, more comfortable.   Abtx:  Anti-infectives    Start     Dose/Rate Route Frequency Ordered Stop   03/03/16 0100  vancomycin (VANCOCIN) IVPB 750 mg/150 ml premix  Status:  Discontinued     750 mg 150 mL/hr over 60 Minutes Intravenous Every 24 hours 03/02/16 0819 03/02/16 1718   03/02/16 0600  piperacillin-tazobactam (ZOSYN) IVPB 3.375 g  Status:  Discontinued     3.375 g 12.5 mL/hr over 240 Minutes Intravenous Every 8 hours 03/01/16 2201 03/02/16 1718   03/01/16 2215  piperacillin-tazobactam (ZOSYN) IVPB 3.375 g     3.375 g 100 mL/hr over 30 Minutes Intravenous STAT 03/01/16 2201 03/01/16 2245   03/01/16 2215  vancomycin (VANCOCIN) IVPB 1000 mg/200 mL premix     1,000 mg 200 mL/hr over 60 Minutes Intravenous  Once 03/01/16 2203 03/02/16 0218      Medications:  Scheduled: . citalopram  20 mg Oral QHS  . enoxaparin (LOVENOX) injection  40 mg Subcutaneous Q24H  . furosemide  20 mg Oral Daily  . hydrALAZINE  50 mg Oral TID  . isosorbide mononitrate  30 mg Oral Daily  . metoprolol succinate  25 mg Oral Daily  . potassium chloride  10 mEq Oral Daily  . rosuvastatin  10 mg Oral QHS  . sodium chloride flush  3 mL Intravenous Q12H  . traZODone  100 mg Oral QHS  . zolpidem  5 mg Oral QHS    Objective: Vital signs in last 24 hours: Temp:  [98 F (36.7 C)-98.9 F (37.2 C)] 98.1 F (36.7 C) (12/03 0912) Pulse Rate:  [58-66] 58 (12/03 0912) Resp:  [18-20] 20 (12/03 0912) BP: (106-180)/(43-67) 180/63 (12/03 0912) SpO2:  [94 %-100 %] 99 % (12/03  0912)   General appearance: alert, cooperative and no distress Resp: clear to auscultation bilaterally Cardio: regular rate and rhythm GI: normal findings: bowel sounds normal and soft, non-tender  Lab Results  Recent Labs  03/02/16 0540 03/03/16 0233  WBC 7.0 4.9  HGB 9.9* 8.7*  HCT 31.7* 28.0*  NA 139 136  K 3.4* 3.8  CL 105 105  CO2 26 25  BUN 14 15  CREATININE 1.30* 1.32*   Liver Panel  Recent Labs  03/01/16 1917  PROT 6.3*  ALBUMIN 3.4*  AST 14*  ALT 6*  ALKPHOS 59  BILITOT 0.5   Sedimentation Rate No results for input(s): ESRSEDRATE in the last 72 hours. C-Reactive Protein No results for input(s): CRP in the last 72 hours.  Microbiology: No results found for this or any previous visit (from the past 240 hour(s)).  Studies/Results: Mr Lumbar Spine Wo Contrast  Result Date: 03/01/2016 CLINICAL DATA:  81 y/o  F; lower back pain and unable to walk. EXAM: MRI LUMBAR SPINE WITHOUT CONTRAST TECHNIQUE: Multiplanar, multisequence MR imaging of the lumbar spine was performed. No intravenous contrast was administered. COMPARISON:  None. FINDINGS: Segmentation:  Standard. Alignment:  Normal lumbar lordosis.  Grade 1 L5-S1 anterolisthesis. Vertebrae: There is diffuse edema throughout the L5 and S1 vertebral  bodies and increased signal within the intervertebral disc likely representing discitis osteomyelitis. The increased disc signal extends superiorly into the right anterior and right lateral epidural space at the L5 level (series 7: Image 33 and 6:10) as well as the prevertebral space to the left and anterior of the S1 vertebral body (6:13 and 7:40). Additionally, there is prevertebral edema extending from the L4 level overlying the sacrum in the presacral fat. Increased signal at the L4-5 intervertebral disc level may represent developing infection as well. Conus medullaris: Extends to the lower L1 level and appears normal. Paraspinal and other soft tissues: As above. Right  kidney interpolar partially visualize cyst. Disc levels: L1-2: No significant disc displacement, foraminal narrowing, or canal stenosis. L2-3: Minimal disc bulge. No significant foraminal narrowing or canal stenosis. L3-4: Small disc bulge with mild facet and ligamentum flavum hypertrophy. No significant foraminal narrowing. Mild narrowing of lateral recesses and mild canal stenosis. L4-5: Moderate disc bulge with moderate facet and ligamentum flavum hypertrophy. Mild bilateral foraminal narrowing. Severe canal stenosis. L5-S1: Anterolisthesis with uncovered disc, extensive facet and ligamentum flavum hypertrophy, and fluid collection in the right anterior and lateral epidural space. Moderate to severe canal stenosis with crowding of cauda equina and severe bilateral foraminal narrowing. IMPRESSION: 1. L5-S1 discitis osteomyelitis. Right anterior and lateral epidural collection at the L5 level and presacral collection to the left and anterior of S1 vertebral body. Paraspinal inflammatory changes of soft tissues. The full extent of disease can be better characterized with a contrast examination. 2. The L5-S1 epidural collection and degenerative disease results in moderate to severe canal stenosis with crowding of the cauda equina at the L5-S1 level. 3. Mild increased signal within the L4-5 intervertebral disc may represent early infection. 4. Additional degenerative changes of the spine resulting in severe L4-5 canal stenosis and severe foraminal narrowing at the L5-S1 level bilaterally. These results were called by telephone at the time of interpretation on 03/01/2016 at 7:31 pm to Dr. Francine Graven , who verbally acknowledged these results. Electronically Signed   By: Kristine Garbe M.D.   On: 03/01/2016 19:34     Assessment/Plan: Diskitis, abscess  Spoke with CT- they will schedule her for aspirate tomorrow.  Continue to hold anbx Await bcx  Total days of antibiotics: 0         Bobby Rumpf Infectious Diseases (pager) [phone] [email or web address] 03/03/2016, 12:06 PM  LOS: 2 days

## 2016-03-03 NOTE — Progress Notes (Signed)
PROGRESS NOTE    [name]  [id number] DOB: 1935-03-30 DOA: 03/01/2016 PCP: Terald Sleeper, PA-C  Brief Narrative: [name] is a 81 y.o. female with progressive low back pain, worse over the last three to four days.  She denies fevers or chills.  She complained of difficulty standing because of the pain.   Saw Ortho last week had MRI Came to ED at [facility] last Pm, MRI with L5-S1 discitis ID consulting, CT guided aspiration pending  Assessment & Plan:   Principal Problem:   Discitis of lumbar region L5- S1 -stopped empiric Vanc/Zosyn -ID Dr.Hatcher consulting -requested CT guided aspiration per IR, will likely happen tomorrow -Neurosurgery recommended medical Rx -pain control -PT eval pending   CAD s/p LAD stent in 2013 -Stable ,  Not on ASA due to high bleeding risk and anemia per Cards notes -continue metoprolol, Crestor, and Imdur.   Essential HTN:  -soft but stable, hold cozaar, continue metop and imdur   Paroxysmal atrial fibrillation: - Stable, In NSR at present on metoprolol. -per Cards no longer on anticoagulation due to high bleeding risk, anemia, and falls risk.   Chronic diastolic heart failure:  -compensated on Po lasix, monitor  CKD 3 -stable  H/o perforated diverticulitis, status post partial colectomy and colostomy. -stable  H/o gastric volvulus/complicated hiatal hernia -was on TNA in summer, now stable, saw Surgeon at [facility]  DVT prophylaxis:SCDs, lovenox x1 today, then hold for CT aspiration Code Status:FUll Code Family Communication:None at bedside Disposition Plan:pending workup   Consultants:  ID NSG  Antimicrobials: Vanc/Zosyn  Subjective: Back pain, ok as long as in bed  Objective: Vitals:   03/02/16 2051 03/03/16 0046 03/03/16 0524 03/03/16 0912  BP: (!) 134/50 (!) 151/63 (!) 160/67 (!) 180/63  Pulse: 61 62 (!) 59 (!) 58  Resp: 18 18 18 20   Temp: 98.2 F (36.8 C) 98 F (36.7 C) 98.4 F (36.9 C) 98.1 F (36.7 C)    TempSrc: Oral Oral Oral Oral  SpO2: 94% 97% 100% 99%  Weight:      Height:        Intake/Output Summary (Last 24 hours) at 03/03/16 1127 Last data filed at 03/02/16 2030  Gross per 24 hour  Intake              240 ml  Output                0 ml  Net              240 ml   Filed Weights   03/01/16 1548 03/01/16 2321  Weight: 58.5 kg (129 lb) 59.5 kg (131 lb 1.6 oz)    Examination:  General exam: Appears calm and comfortable, no distress Respiratory system: Clear to auscultation. Respiratory effort normal. Cardiovascular system: S1 & S2 heard, RRR. No JVD, murmurs, rubs, gallops or clicks. No pedal edema. Gastrointestinal system: Abdomen is nondistended, soft and nontender. No organomegaly or masses felt. Normal bowel sounds heard. Central nervous system: Alert and oriented. No focal neurological deficits, pain with L leg movement Extremities: Symmetric 5 x 5 power. Skin: No rashes, lesions or ulcers Psychiatry: Judgement and insight appear normal. Mood & affect appropriate.     Data Reviewed: I have personally reviewed following labs and imaging studies  CBC:  Recent Labs Lab 03/01/16 1917 03/02/16 0540 03/03/16 0233  WBC 8.7 7.0 4.9  NEUTROABS 5.8  --   --   HGB 10.4* 9.9* 8.7*  HCT 33.1*  31.7* 28.0*  MCV 80.7 79.6 80.9  PLT 186 169 [id number]   Basic Metabolic Panel:  Recent Labs Lab 03/01/16 1917 03/02/16 0540 03/03/16 0233  NA 132* 139 136  K 3.2* 3.4* 3.8  CL 101 105 105  CO2 23 26 25   GLUCOSE 98 96 105*  BUN 17 14 15   CREATININE 1.30* 1.30* 1.32*  CALCIUM 8.7* 8.6* 8.4*   GFR: Estimated Creatinine Clearance: 27.3 mL/min (by C-G formula based on SCr of 1.32 mg/dL (H)). Liver Function Tests:  Recent Labs Lab 03/01/16 1917  AST 14*  ALT 6*  ALKPHOS 59  BILITOT 0.5  PROT 6.3*  ALBUMIN 3.4*    Recent Labs Lab 03/01/16 1917  LIPASE 16   No results for input(s): AMMONIA in the last 168 hours. Coagulation Profile: No results for input(s):  INR, PROTIME in the last 168 hours. Cardiac Enzymes: No results for input(s): CKTOTAL, CKMB, CKMBINDEX, TROPONINI in the last 168 hours. BNP (last 3 results) No results for input(s): PROBNP in the last 8760 hours. HbA1C: No results for input(s): HGBA1C in the last 72 hours. CBG: No results for input(s): GLUCAP in the last 168 hours. Lipid Profile: No results for input(s): CHOL, HDL, LDLCALC, TRIG, CHOLHDL, LDLDIRECT in the last 72 hours. Thyroid Function Tests: No results for input(s): TSH, T4TOTAL, FREET4, T3FREE, THYROIDAB in the last 72 hours. Anemia Panel: No results for input(s): VITAMINB12, FOLATE, FERRITIN, TIBC, IRON, RETICCTPCT in the last 72 hours. Urine analysis:    Component Value Date/Time   COLORURINE YELLOW 03/01/2016 2224   APPEARANCEUR CLEAR 03/01/2016 2224   LABSPEC 1.015 03/01/2016 2224   PHURINE 7.5 03/01/2016 2224   GLUCOSEU NEGATIVE 03/01/2016 2224   HGBUR NEGATIVE 03/01/2016 2224   BILIRUBINUR NEGATIVE 03/01/2016 2224   KETONESUR NEGATIVE 03/01/2016 2224   PROTEINUR NEGATIVE 03/01/2016 2224   UROBILINOGEN 0.2 02/10/2015 1936   NITRITE NEGATIVE 03/01/2016 2224   LEUKOCYTESUR NEGATIVE 03/01/2016 2224   Sepsis Labs: @LABRCNTIP (procalcitonin:4,lacticidven:4)  )No results found for this or any previous visit (from the past 240 hour(s)).       Radiology Studies: Mr Lumbar Spine Wo Contrast  Result Date: 03/01/2016 CLINICAL DATA:  81 y/o  F; lower back pain and unable to walk. EXAM: MRI LUMBAR SPINE WITHOUT CONTRAST TECHNIQUE: Multiplanar, multisequence MR imaging of the lumbar spine was performed. No intravenous contrast was administered. COMPARISON:  None. FINDINGS: Segmentation:  Standard. Alignment:  Normal lumbar lordosis.  Grade 1 L5-S1 anterolisthesis. Vertebrae: There is diffuse edema throughout the L5 and S1 vertebral bodies and increased signal within the intervertebral disc likely representing discitis osteomyelitis. The increased disc signal  extends superiorly into the right anterior and right lateral epidural space at the L5 level (series 7: Image 33 and 6:10) as well as the prevertebral space to the left and anterior of the S1 vertebral body (6:13 and 7:40). Additionally, there is prevertebral edema extending from the L4 level overlying the sacrum in the presacral fat. Increased signal at the L4-5 intervertebral disc level may represent developing infection as well. Conus medullaris: Extends to the lower L1 level and appears normal. Paraspinal and other soft tissues: As above. Right kidney interpolar partially visualize cyst. Disc levels: L1-2: No significant disc displacement, foraminal narrowing, or canal stenosis. L2-3: Minimal disc bulge. No significant foraminal narrowing or canal stenosis. L3-4: Small disc bulge with mild facet and ligamentum flavum hypertrophy. No significant foraminal narrowing. Mild narrowing of lateral recesses and mild canal stenosis. L4-5: Moderate disc bulge with moderate facet and ligamentum  flavum hypertrophy. Mild bilateral foraminal narrowing. Severe canal stenosis. L5-S1: Anterolisthesis with uncovered disc, extensive facet and ligamentum flavum hypertrophy, and fluid collection in the right anterior and lateral epidural space. Moderate to severe canal stenosis with crowding of cauda equina and severe bilateral foraminal narrowing. IMPRESSION: 1. L5-S1 discitis osteomyelitis. Right anterior and lateral epidural collection at the L5 level and presacral collection to the left and anterior of S1 vertebral body. Paraspinal inflammatory changes of soft tissues. The full extent of disease can be better characterized with a contrast examination. 2. The L5-S1 epidural collection and degenerative disease results in moderate to severe canal stenosis with crowding of the cauda equina at the L5-S1 level. 3. Mild increased signal within the L4-5 intervertebral disc may represent early infection. 4. Additional degenerative changes  of the spine resulting in severe L4-5 canal stenosis and severe foraminal narrowing at the L5-S1 level bilaterally. These results were called by telephone at the time of interpretation on 03/01/2016 at 7:31 pm to Dr. Francine Graven , who verbally acknowledged these results. Electronically Signed   By: Kristine Garbe M.D.   On: 03/01/2016 19:34        Scheduled Meds: . citalopram  20 mg Oral QHS  . enoxaparin (LOVENOX) injection  40 mg Subcutaneous Q24H  . furosemide  20 mg Oral Daily  . hydrALAZINE  50 mg Oral TID  . isosorbide mononitrate  30 mg Oral Daily  . metoprolol succinate  25 mg Oral Daily  . potassium chloride  10 mEq Oral Daily  . rosuvastatin  10 mg Oral QHS  . sodium chloride flush  3 mL Intravenous Q12H  . traZODone  100 mg Oral QHS  . zolpidem  5 mg Oral QHS   Continuous Infusions:   LOS: 2 days    Time spent: 41min    Domenic Polite, MD [facility] Pager [phone]  If 7PM-7AM, please contact night-coverage [email or web address] Password [email or web address] 03/03/2016, 11:27 AM

## 2016-03-04 ENCOUNTER — Inpatient Hospital Stay (HOSPITAL_COMMUNITY): Payer: Medicare Other

## 2016-03-04 ENCOUNTER — Ambulatory Visit: Payer: Medicare Other

## 2016-03-04 HISTORY — PX: IR GENERIC HISTORICAL: IMG1180011

## 2016-03-04 LAB — URINE CULTURE

## 2016-03-04 LAB — APTT: APTT: 37 s — AB (ref 24–36)

## 2016-03-04 LAB — PROTIME-INR
INR: 1.12
Prothrombin Time: 14.4 seconds (ref 11.4–15.2)

## 2016-03-04 MED ORDER — BUPIVACAINE HCL (PF) 0.25 % IJ SOLN
INTRAMUSCULAR | Status: AC
  Filled 2016-03-04: qty 30

## 2016-03-04 MED ORDER — VANCOMYCIN HCL IN DEXTROSE 750-5 MG/150ML-% IV SOLN
750.0000 mg | INTRAVENOUS | Status: DC
  Administered 2016-03-05 – 2016-03-06 (×2): 750 mg via INTRAVENOUS
  Filled 2016-03-04 (×3): qty 150

## 2016-03-04 MED ORDER — BUPIVACAINE HCL (PF) 0.5 % IJ SOLN
INTRAMUSCULAR | Status: AC | PRN
  Administered 2016-03-04: 10 mL

## 2016-03-04 MED ORDER — FENTANYL CITRATE (PF) 100 MCG/2ML IJ SOLN
INTRAMUSCULAR | Status: AC | PRN
  Administered 2016-03-04 (×2): 25 ug via INTRAVENOUS

## 2016-03-04 MED ORDER — MIDAZOLAM HCL 2 MG/2ML IJ SOLN
INTRAMUSCULAR | Status: AC | PRN
  Administered 2016-03-04 (×2): 1 mg via INTRAVENOUS

## 2016-03-04 MED ORDER — FENTANYL CITRATE (PF) 100 MCG/2ML IJ SOLN
INTRAMUSCULAR | Status: AC
  Filled 2016-03-04: qty 2

## 2016-03-04 MED ORDER — MIDAZOLAM HCL 2 MG/2ML IJ SOLN
INTRAMUSCULAR | Status: AC
  Filled 2016-03-04: qty 2

## 2016-03-04 MED ORDER — SODIUM CHLORIDE 0.9 % IV SOLN
INTRAVENOUS | Status: AC | PRN
  Administered 2016-03-04: 10 mL/h via INTRAVENOUS

## 2016-03-04 MED ORDER — DEXTROSE 5 % IV SOLN
2.0000 g | INTRAVENOUS | Status: DC
  Administered 2016-03-04 – 2016-03-06 (×3): 2 g via INTRAVENOUS
  Filled 2016-03-04 (×4): qty 2

## 2016-03-04 MED ORDER — VANCOMYCIN HCL IN DEXTROSE 1-5 GM/200ML-% IV SOLN
1000.0000 mg | Freq: Once | INTRAVENOUS | Status: AC
  Administered 2016-03-04: 1000 mg via INTRAVENOUS
  Filled 2016-03-04: qty 200

## 2016-03-04 MED ORDER — ENOXAPARIN SODIUM 30 MG/0.3ML ~~LOC~~ SOLN
30.0000 mg | SUBCUTANEOUS | Status: DC
  Administered 2016-03-05 – 2016-03-07 (×3): 30 mg via SUBCUTANEOUS
  Filled 2016-03-04 (×3): qty 0.3

## 2016-03-04 NOTE — Progress Notes (Signed)
PT Cancellation Note  Patient Details Name: Vernadine Asker Kondo MRN: [id number] DOB: [birth year]   Cancelled Treatment:    Reason Eval/Treat Not Completed: Patient at procedure or test/unavailable. (off unit for aspiration per RN)   Jeanie Cooks Bralin Garry 03/04/2016, 9:43 AM Pager [phone]

## 2016-03-04 NOTE — Progress Notes (Signed)
PROGRESS NOTE    [name]  [id number] DOB: [birth year] DOA: 03/01/2016 PCP: Terald Sleeper, PA-C  Brief Narrative: [name] is a 81 y.o. female with progressive low back pain, worse over the last three to four days.  She denies fevers or chills.  She complained of difficulty standing because of the pain.   Saw Ortho last week had MRI Came to ED at [facility] last Pm, MRI with L5-S1 discitis ID consulting, CT guided aspiration pending  Assessment & Plan:     Discitis of lumbar region L5- S1 -stopped empiric Vanc/Zosyn -ID Dr.Hatcher consulting -requested CT guided disc aspiration per IR today -FU cultures -Neurosurgery recommended medical Rx -pain control -PT eval pending   CAD s/p LAD stent in 2013 -Stable ,  Not on ASA due to high bleeding risk and anemia per Cards notes -continue metoprolol, Crestor, and Imdur.   Essential HTN:  -soft but stable, hold cozaar, continue metop and imdur   Paroxysmal atrial fibrillation: - Stable, In NSR at present on metoprolol. -per Cards no longer on anticoagulation due to high bleeding risk, anemia, and falls risk.   Chronic diastolic heart failure:  -compensated on Po lasix, monitor  CKD 3 -stable  H/o perforated diverticulitis, status post partial colectomy and colostomy. -stable  H/o gastric volvulus/complicated hiatal hernia -was on TNA in summer, now stable, saw Surgeon at [facility]  DVT prophylaxis:resume lovenox tomorrow Code Status:FUll Code Family Communication:None at bedside Disposition Plan:pending workup   Consultants:  ID NSG  Antimicrobials: Vanc/Zosyn  Subjective: Back pain, ok as long as in bed  Objective: Vitals:   03/04/16 1015 03/04/16 1020 03/04/16 1025 03/04/16 1031  BP: (!) 175/73 (!) 162/75 (!) 160/72 (!) 172/70  Pulse: 60 (!) 59 63 64  Resp: 12 13 13 16   Temp:      TempSrc:      SpO2: 100% 100% 100% 97%  Weight:      Height:       No intake or output data in the 24 hours ending  03/04/16 1200 Filed Weights   03/01/16 1548 03/01/16 2321  Weight: 58.5 kg (129 lb) 59.5 kg (131 lb 1.6 oz)    Examination:  General exam: Appears calm and comfortable, no distress Respiratory system: Clear to auscultation. Respiratory effort normal. Cardiovascular system: S1 & S2 heard, RRR. No JVD, murmurs, rubs, gallops or clicks. No pedal edema. Gastrointestinal system: Abdomen is nondistended, soft and nontender. No organomegaly or masses felt. Normal bowel sounds heard. Central nervous system: Alert and oriented. No focal neurological deficits, pain with L leg movement Extremities: Symmetric 5 x 5 power. Skin: No rashes, lesions or ulcers Psychiatry: Judgement and insight appear normal. Mood & affect appropriate.     Data Reviewed: I have personally reviewed following labs and imaging studies  CBC:  Recent Labs Lab 03/01/16 1917 03/02/16 0540 03/03/16 0233  WBC 8.7 7.0 4.9  NEUTROABS 5.8  --   --   HGB 10.4* 9.9* 8.7*  HCT 33.1* 31.7* 28.0*  MCV 80.7 79.6 80.9  PLT 186 169 [id number]   Basic Metabolic Panel:  Recent Labs Lab 03/01/16 1917 03/02/16 0540 03/03/16 0233  NA 132* 139 136  K 3.2* 3.4* 3.8  CL 101 105 105  CO2 23 26 25   GLUCOSE 98 96 105*  BUN 17 14 15   CREATININE 1.30* 1.30* 1.32*  CALCIUM 8.7* 8.6* 8.4*   GFR: Estimated Creatinine Clearance: 27.3 mL/min (by C-G formula based on SCr of 1.32 mg/dL (H)).  Liver Function Tests:  Recent Labs Lab 03/01/16 1917  AST 14*  ALT 6*  ALKPHOS 59  BILITOT 0.5  PROT 6.3*  ALBUMIN 3.4*    Recent Labs Lab 03/01/16 1917  LIPASE 16   No results for input(s): AMMONIA in the last 168 hours. Coagulation Profile:  Recent Labs Lab 03/04/16 0600  INR 1.12   Cardiac Enzymes: No results for input(s): CKTOTAL, CKMB, CKMBINDEX, TROPONINI in the last 168 hours. BNP (last 3 results) No results for input(s): PROBNP in the last 8760 hours. HbA1C: No results for input(s): HGBA1C in the last 72  hours. CBG: No results for input(s): GLUCAP in the last 168 hours. Lipid Profile: No results for input(s): CHOL, HDL, LDLCALC, TRIG, CHOLHDL, LDLDIRECT in the last 72 hours. Thyroid Function Tests: No results for input(s): TSH, T4TOTAL, FREET4, T3FREE, THYROIDAB in the last 72 hours. Anemia Panel: No results for input(s): VITAMINB12, FOLATE, FERRITIN, TIBC, IRON, RETICCTPCT in the last 72 hours. Urine analysis:    Component Value Date/Time   COLORURINE YELLOW 03/01/2016 2224   APPEARANCEUR CLEAR 03/01/2016 2224   LABSPEC 1.015 03/01/2016 2224   PHURINE 7.5 03/01/2016 2224   GLUCOSEU NEGATIVE 03/01/2016 2224   HGBUR NEGATIVE 03/01/2016 2224   BILIRUBINUR NEGATIVE 03/01/2016 2224   KETONESUR NEGATIVE 03/01/2016 2224   PROTEINUR NEGATIVE 03/01/2016 2224   UROBILINOGEN 0.2 02/10/2015 1936   NITRITE NEGATIVE 03/01/2016 2224   LEUKOCYTESUR NEGATIVE 03/01/2016 2224   Sepsis Labs: @LABRCNTIP (procalcitonin:4,lacticidven:4)  ) Recent Results (from the past 240 hour(s))  Urine culture     Status: Abnormal   Collection Time: 03/01/16 10:24 PM  Result Value Ref Range Status   Specimen Description URINE, RANDOM  Final   Special Requests NONE  Final   Culture MULTIPLE SPECIES PRESENT, SUGGEST RECOLLECTION (A)  Final   Report Status 03/04/2016 FINAL  Final  Culture, blood (Routine X 2) w Reflex to ID Panel     Status: None (Preliminary result)   Collection Time: 03/02/16  9:15 AM  Result Value Ref Range Status   Specimen Description BLOOD RIGHT HAND  Final   Special Requests BOTTLES DRAWN AEROBIC AND ANAEROBIC 5CC  Final   Culture NO GROWTH 1 DAY  Final   Report Status PENDING  Incomplete  Culture, blood (Routine X 2) w Reflex to ID Panel     Status: None (Preliminary result)   Collection Time: 03/02/16  9:18 AM  Result Value Ref Range Status   Specimen Description BLOOD LEFT ANTECUBITAL  Final   Special Requests BOTTLES DRAWN AEROBIC AND ANAEROBIC 5CC  Final   Culture NO GROWTH 1  DAY  Final   Report Status PENDING  Incomplete         Radiology Studies: No results found.      Scheduled Meds: . bupivacaine (PF)      . citalopram  20 mg Oral QHS  . fentaNYL      . furosemide  20 mg Oral Daily  . hydrALAZINE  50 mg Oral TID  . isosorbide mononitrate  30 mg Oral Daily  . metoprolol succinate  25 mg Oral Daily  . midazolam      . polyethylene glycol  17 g Oral Daily  . potassium chloride  10 mEq Oral Daily  . rosuvastatin  10 mg Oral QHS  . sodium chloride flush  3 mL Intravenous Q12H  . traZODone  100 mg Oral QHS  . zolpidem  5 mg Oral QHS   Continuous Infusions:   LOS: 3  days    Time spent: 46min    Domenic Polite, MD [facility] Pager [phone]  If 7PM-7AM, please contact night-coverage [email or web address] Password [email or web address] 03/04/2016, 12:00 PM

## 2016-03-04 NOTE — Procedures (Addendum)
S/P fluoro guided L5-S1  Disc aspiration .X2 passes with retrieval   2.5 ml of thick blood stained  liquid  aspirate sent for microbiological testing

## 2016-03-04 NOTE — Consult Note (Signed)
Chief Complaint: Patient was seen in consultation today for Lumbar 5-Sacral 1 disc aspiration Chief Complaint  Patient presents with  . Back Pain   at the request of Dr Fanny Bien  Referring Physician(s): Dr Merry Proud hatcher  Supervising Physician: Luanne Bras  Patient Status: [facility] - In-pt  History of Present Illness: [name] is a 81 y.o. female   Back pain worsening x 3 days Discitis L5-S1 MRI 03/01/16: IMPRESSION: 1. L5-S1 discitis osteomyelitis. Right anterior and lateral epidural collection at the L5 level and presacral collection to the left and anterior of S1 vertebral body. Paraspinal inflammatory changes of soft tissues. The full extent of disease can be better characterized with a contrast examination. 2. The L5-S1 epidural collection and degenerative disease results in moderate to severe canal stenosis with crowding of the cauda equina at the L5-S1 level. 3. Mild increased signal within the L4-5 intervertebral disc may represent early infection. 4. Additional degenerative changes of the spine resulting in severe L4-5 canal stenosis and severe foraminal narrowing at the L5-S1 level bilaterally.  Consult with Dr Johnnye Sima and he has requested L5-S1 disc aspiration Dr Estanislado Pandy has reviewed imaging and approves procedure  Past Medical History:  Diagnosis Date  . Anemia   . Anxiety state, unspecified   . Atrial fibrillation with RVR ([place])   . [name]'s esophagus   . Bilateral sciatica   . Cellulitis of extremity   . Chronic back pain   . Congestive heart failure, unspecified    diastolic heart failure  . Coronary artery disease    a. Moderate to severe coronary disease involving the left anterior descending artery and right coronary artery.  Sequential stenosis in the LAD is significant.  Right coronary artery is moderate to severely likely nonischemic. Questionable small LVOT obstruction.  No Brockenbrough maneuver was performed.  Catheterization January  2013; b. 05/2014 MV no isch/infarct, EF 60%.  . Depressive disorder, not elsewhere classified   . Diverticulitis   . Erosive esophagitis 08/04/2015  . Esophageal reflux   . Gastric volvulus 08/04/2015  . Gastritis   . H/O hiatal hernia   . Hypertension   . IBS (irritable bowel syndrome)   . Infection of esophagostomy ([place])   . Macular degeneration   . Melanoma ([place])    removal on back   . Pelvic abscess in female   . Perforated bowel ([place])   . Perforated sigmoid colon ([place])   . PONV (postoperative nausea and vomiting)   . Pulmonary hypertension    PA systolic pressure [id number] mmHg by echocardiogram, PA pressure 33/10 by cardiac catheterization PA saturation 62% thermodilution cardiac index 2.0 thick cardiac index 2.4  . Pure hypercholesterolemia   . Tricuspid regurgitation    moderate tricuspid regurgitation by echocardiogram, prior use of anorexic agents    Past Surgical History:  Procedure Laterality Date  . CARDIAC CATHETERIZATION Left 03/05/2015   Procedure: CENTRAL LINE INSERTION;  Surgeon: Aviva Signs, MD;  Location: [facility];  Service: General;  Laterality: Left;  . CATARACT EXTRACTION W/ INTRAOCULAR LENS  IMPLANT, BILATERAL  ~ 2002  . COLECTOMY WITH COLOSTOMY CREATION/HARTMANN PROCEDURE N/A 03/05/2015   Procedure: PARTIAL COLECTOMY WITH COLOSTOMY CREATION/HARTMANN PROCEDURE;  Surgeon: Aviva Signs, MD;  Location: [facility];  Service: General;  Laterality: N/A;  . CORONARY ANGIOPLASTY WITH STENT PLACEMENT  05/08/11   "1"  . ESOPHAGEAL MANOMETRY N/A 06/06/2014   Procedure: ESOPHAGEAL MANOMETRY (EM);  Surgeon: Jerene Bears, MD;  Location: [facility];  Service: Gastroenterology;  Laterality:  N/A;  . ESOPHAGOGASTRODUODENOSCOPY (EGD) WITH PROPOFOL N/A 08/04/2015   Procedure: ESOPHAGOGASTRODUODENOSCOPY (EGD) WITH PROPOFOL;  Surgeon: Mauri Pole, MD;  Location: [facility];  Service: Endoscopy;  Laterality: N/A;  . HERNIA REPAIR    . LAPAROSCOPIC NISSEN FUNDOPLICATION N/A [id number]    Procedure: LAPAROSCOPIC  LYSIS OF ADHESIONS, SEGMENTAL GASTRECTOMY, PARTIAL REDUCTION OF HERNIA;  Surgeon: [name], MD;  Location: [facility];  Service: General;  Laterality: N/A;  . NISSEN FUNDOPLICATION  [id number]  . PERCUTANEOUS CORONARY STENT INTERVENTION (PCI-S) N/A 05/08/2011   Procedure: PERCUTANEOUS CORONARY STENT INTERVENTION (PCI-S);  Surgeon: Wellington Hampshire, MD;  Location: [facility];  Service: Cardiovascular;  Laterality: N/A;  . ROTATOR CUFF REPAIR  ~ 2001   right  . SHOULDER ARTHROSCOPY  ~ 2004; 2005   left; "joint's wore out"  . TONSILLECTOMY  ~ 1944  . TUBAL LIGATION  1972    Allergies: Sulfamethoxazole  Medications: Prior to Admission medications   Medication Sig Start Date End Date Taking? Authorizing Provider  albuterol (PROVENTIL HFA;VENTOLIN HFA) 108 (90 Base) MCG/ACT inhaler Inhale 2 puffs into the lungs every 6 (six) hours as needed for wheezing or shortness of breath.    Yes Historical Provider, MD  ALPRAZolam (XANAX) 0.25 MG tablet Take 1 tablet (0.25 mg total) by mouth 2 (two) times daily as needed for anxiety. 05/08/15  Yes Theodis Blaze, MD  citalopram (CELEXA) 40 MG tablet Take 20 mg by mouth at bedtime.    Yes Historical Provider, MD  esomeprazole (NEXIUM) 40 MG capsule Take 40 mg by mouth daily before breakfast.    Yes Historical Provider, MD  ferrous sulfate 325 (65 FE) MG EC tablet Take 325 mg by mouth daily.    Yes Historical Provider, MD  furosemide (LASIX) 20 MG tablet Take 1 tablet (20 mg total) by mouth daily. 06/19/15  Yes Kinnie Feil, MD  hydrALAZINE (APRESOLINE) 50 MG tablet Take 1 tablet (50 mg total) by mouth 3 (three) times daily. 02/12/16  Yes Terald Sleeper, PA-C  isosorbide mononitrate (IMDUR) 30 MG 24 hr tablet Take 30 mg by mouth daily. 07/21/15  Yes Historical Provider, MD  loratadine (CLARITIN) 10 MG tablet Take 10 mg by mouth daily.   Yes Historical Provider, MD  losartan (COZAAR) 100 MG tablet Take 1 tablet (100 mg total) by mouth daily.  11/11/12  Yes Herminio Commons, MD  metoprolol succinate (TOPROL-XL) 25 MG 24 hr tablet Take 1 tablet (25 mg total) by mouth daily. 08/29/15  Yes Rexene Alberts, MD  Multiple Vitamins-Minerals (PRESERVISION AREDS) TABS Take 1 tablet by mouth 2 (two) times daily.    Yes Historical Provider, MD  nitroGLYCERIN (NITROSTAT) 0.4 MG SL tablet Place 0.4 mg under the tongue every 5 (five) minutes as needed for chest pain. Reported on 04/26/2015   Yes Historical Provider, MD  polyethylene glycol powder (GLYCOLAX/MIRALAX) powder MIX 17 GRAMS IN AT LEAST 8OZ OF WATER/JUICE AND DRINK ONCE OR TWICE DAILY 08/29/15  Yes Rexene Alberts, MD  potassium chloride (K-DUR) 10 MEQ tablet Take 1 tablet (10 mEq total) by mouth daily. 06/19/15  Yes Kinnie Feil, MD  rosuvastatin (CRESTOR) 10 MG tablet Take 10 mg by mouth at bedtime.    Yes Historical Provider, MD  traZODone (DESYREL) 100 MG tablet Take 100 mg by mouth at bedtime.    Yes Historical Provider, MD  Vitamin D, Ergocalciferol, (DRISDOL) 50000 UNITS CAPS Take 50,000 Units by mouth every Tuesday.    Yes Historical Provider, MD  zolpidem (AMBIEN) 10 MG tablet Take 10 mg by mouth at bedtime.   Yes Historical Provider, MD  acetaminophen-codeine (TYLENOL #3) 300-30 MG tablet Take 1 tablet by mouth every 6 (six) hours as needed for moderate pain. Patient not taking: Reported on 03/01/2016 01/15/16   Terald Sleeper, PA-C  amoxicillin (AMOXIL) 500 MG capsule Take 1 capsule (500 mg total) by mouth 3 (three) times daily. Patient not taking: Reported on 03/01/2016 02/12/16   Terald Sleeper, PA-C  predniSONE (DELTASONE) 10 MG tablet TAKE 1 TAB DAILY FOR 10 DAYS THEN TAKE 1/2 TAB DAILY FOR 10 DAYS Steward 1/2 TAB EVERY OTHER DAY Patient not taking: Reported on 03/01/2016 02/27/16   Terald Sleeper, PA-C     Family History  Problem Relation Age of Onset  . Heart attack Father   . Hypertension Father   . Diabetes Mother   . Hypertension Mother   . Skin cancer Brother      melanoma  . Colon cancer Neg Hx   . Esophageal cancer Neg Hx   . Rectal cancer Neg Hx   . Stomach cancer Neg Hx     Social History   Social History  . Marital status: Divorced    Spouse name: N/A  . Number of children: 3  . Years of education: N/A   Occupational History  . Retired    Social History Main Topics  . Smoking status: Former Smoker    Packs/day: 1.00    Years: 20.00    Types: Cigarettes    Start date: 10/12/1958    Quit date: 04/26/1988  . Smokeless tobacco: Never Used  . Alcohol use No  . Drug use: No  . Sexual activity: No   Other Topics Concern  . None   Social History Narrative   Divorced.  Lives alone.  Ambulates with a walker.     Review of Systems: A 12 point ROS discussed and pertinent positives are indicated in the HPI above.  All other systems are negative.  Review of Systems  Constitutional: Positive for activity change and appetite change. Negative for fatigue and fever.  Respiratory: Negative for shortness of breath.   Gastrointestinal: Negative for abdominal pain.  Musculoskeletal: Positive for back pain and gait problem.  Neurological: Positive for weakness.  Psychiatric/Behavioral: Negative for behavioral problems and confusion.    Vital Signs: BP (!) 144/67 (BP Location: Left Arm)   Pulse 86   Temp 98.6 F (37 C) (Oral)   Resp 18   Ht 5\' 2"  (1.575 m)   Wt 131 lb 1.6 oz (59.5 kg)   SpO2 97%   BMI 23.98 kg/m   Physical Exam  Cardiovascular: Normal heart sounds.   No murmur heard. Pulmonary/Chest: Effort normal and breath sounds normal. She has no wheezes.  Abdominal: Soft. Bowel sounds are normal. There is no tenderness.  Musculoskeletal: Normal range of motion.  Low back pain  Skin: Skin is warm and dry.  Psychiatric: She has a normal mood and affect. Her behavior is normal. Judgment and thought content normal.  Nursing note and vitals reviewed.   Mallampati Score:  MD Evaluation Airway: WNL Heart: WNL Abdomen:  WNL ASA  Classification: 3 Mallampati/Airway Score: One  Imaging: Mr Lumbar Spine Wo Contrast  Result Date: 03/01/2016 CLINICAL DATA:  81 y/o  F; lower back pain and unable to walk. EXAM: MRI LUMBAR SPINE WITHOUT CONTRAST TECHNIQUE: Multiplanar, multisequence MR imaging of the lumbar spine was performed. No intravenous contrast was administered. COMPARISON:  None. FINDINGS: Segmentation:  Standard. Alignment:  Normal lumbar lordosis.  Grade 1 L5-S1 anterolisthesis. Vertebrae: There is diffuse edema throughout the L5 and S1 vertebral bodies and increased signal within the intervertebral disc likely representing discitis osteomyelitis. The increased disc signal extends superiorly into the right anterior and right lateral epidural space at the L5 level (series 7: Image 33 and 6:10) as well as the prevertebral space to the left and anterior of the S1 vertebral body (6:13 and 7:40). Additionally, there is prevertebral edema extending from the L4 level overlying the sacrum in the presacral fat. Increased signal at the L4-5 intervertebral disc level may represent developing infection as well. Conus medullaris: Extends to the lower L1 level and appears normal. Paraspinal and other soft tissues: As above. Right kidney interpolar partially visualize cyst. Disc levels: L1-2: No significant disc displacement, foraminal narrowing, or canal stenosis. L2-3: Minimal disc bulge. No significant foraminal narrowing or canal stenosis. L3-4: Small disc bulge with mild facet and ligamentum flavum hypertrophy. No significant foraminal narrowing. Mild narrowing of lateral recesses and mild canal stenosis. L4-5: Moderate disc bulge with moderate facet and ligamentum flavum hypertrophy. Mild bilateral foraminal narrowing. Severe canal stenosis. L5-S1: Anterolisthesis with uncovered disc, extensive facet and ligamentum flavum hypertrophy, and fluid collection in the right anterior and lateral epidural space. Moderate to severe canal  stenosis with crowding of cauda equina and severe bilateral foraminal narrowing. IMPRESSION: 1. L5-S1 discitis osteomyelitis. Right anterior and lateral epidural collection at the L5 level and presacral collection to the left and anterior of S1 vertebral body. Paraspinal inflammatory changes of soft tissues. The full extent of disease can be better characterized with a contrast examination. 2. The L5-S1 epidural collection and degenerative disease results in moderate to severe canal stenosis with crowding of the cauda equina at the L5-S1 level. 3. Mild increased signal within the L4-5 intervertebral disc may represent early infection. 4. Additional degenerative changes of the spine resulting in severe L4-5 canal stenosis and severe foraminal narrowing at the L5-S1 level bilaterally. These results were called by telephone at the time of interpretation on 03/01/2016 at 7:31 pm to Dr. Francine Graven , who verbally acknowledged these results. Electronically Signed   By: Kristine Garbe M.D.   On: 03/01/2016 19:34    Labs:  CBC:  Recent Labs  09/24/15 0930 12/18/15 0919 03/01/16 1917 03/02/16 0540 03/03/16 0233  WBC 4.9 6.7 8.7 7.0 4.9  HGB 9.0*  --  10.4* 9.9* 8.7*  HCT 28.7* 32.4* 33.1* 31.7* 28.0*  PLT 161 273 186 169 157    COAGS:  Recent Labs  05/03/15 0556 08/04/15 0443 08/05/15 1754 03/04/16 0600  INR 1.35 1.19  --  1.12  APTT  --   --  40* 37*    BMP:  Recent Labs  12/18/15 0919 03/01/16 1917 03/02/16 0540 03/03/16 0233  NA 142 132* 139 136  K 4.3 3.2* 3.4* 3.8  CL 101 101 105 105  CO2 27 23 26 25   GLUCOSE 93 98 96 105*  BUN 16 17 14 15   CALCIUM 8.9 8.7* 8.6* 8.4*  CREATININE 1.26* 1.30* 1.30* 1.32*  GFRNONAA 41* 38* 38* 37*  GFRAA 47* 44* 44* 43*    LIVER FUNCTION TESTS:  Recent Labs  08/29/15 0511 09/24/15 0930 12/18/15 0919 03/01/16 1917  BILITOT 0.2* 0.3 <0.2 0.5  AST 14* 16 8 14*  ALT 14 13* 6 6*  ALKPHOS 58 60 90 59  PROT 5.6* 5.5*  6.1 6.3*  ALBUMIN 2.6* 3.0* 3.2*  3.4*    TUMOR MARKERS: No results for input(s): AFPTM, CEA, CA199, CHROMGRNA in the last 8760 hours.  Assessment and Plan:  Low back pain MRI reveals L5-S1 discitis Scheduled now for aspiration of same Risks and Benefits discussed with the patient including, but not limited to bleeding, infection, damage to adjacent structures or low yield requiring additional tests. All of the patient's questions were answered, patient is agreeable to proceed. Consent signed and in chart.  Thank you for this interesting consult.  I greatly enjoyed meeting [name] and look forward to participating in their care.  A copy of this report was sent to the requesting provider on this date.  Electronically Signed: Sia Gabrielsen A 03/04/2016, 9:12 AM   I spent a total of 40 Minutes    in face to face in clinical consultation, greater than 50% of which was counseling/coordinating care for L5-S1 disc aspiration

## 2016-03-04 NOTE — Progress Notes (Signed)
INFECTIOUS DISEASE PROGRESS NOTE  ID: [name] is a 81 y.o. female with  Principal Problem:   Discitis of lumbar region L5- S1 Active Problems:   HTN (hypertension)   Coronary artery disease   CKD (chronic kidney disease) stage 3, GFR 30-59 ml/min   PAF (paroxysmal atrial fibrillation) (HCC)   Epidural abscess L5- S1   Back pain, acute   Chronic diastolic CHF (congestive heart failure) (HCC)  Subjective: Without complaints.   Abtx:  Anti-infectives    Start     Dose/Rate Route Frequency Ordered Stop   03/03/16 0100  vancomycin (VANCOCIN) IVPB 750 mg/150 ml premix  Status:  Discontinued     750 mg 150 mL/hr over 60 Minutes Intravenous Every 24 hours 03/02/16 0819 03/02/16 1718   03/02/16 0600  piperacillin-tazobactam (ZOSYN) IVPB 3.375 g  Status:  Discontinued     3.375 g 12.5 mL/hr over 240 Minutes Intravenous Every 8 hours 03/01/16 2201 03/02/16 1718   03/01/16 2215  piperacillin-tazobactam (ZOSYN) IVPB 3.375 g     3.375 g 100 mL/hr over 30 Minutes Intravenous STAT 03/01/16 2201 03/01/16 2245   03/01/16 2215  vancomycin (VANCOCIN) IVPB 1000 mg/200 mL premix     1,000 mg 200 mL/hr over 60 Minutes Intravenous  Once 03/01/16 2203 03/02/16 0218      Medications:  Scheduled: . bupivacaine (PF)      . citalopram  20 mg Oral QHS  . [START ON 03/05/2016] enoxaparin (LOVENOX) injection  30 mg Subcutaneous Q24H  . fentaNYL      . furosemide  20 mg Oral Daily  . hydrALAZINE  50 mg Oral TID  . isosorbide mononitrate  30 mg Oral Daily  . metoprolol succinate  25 mg Oral Daily  . midazolam      . polyethylene glycol  17 g Oral Daily  . potassium chloride  10 mEq Oral Daily  . rosuvastatin  10 mg Oral QHS  . sodium chloride flush  3 mL Intravenous Q12H  . traZODone  100 mg Oral QHS  . zolpidem  5 mg Oral QHS    Objective: Vital signs in last 24 hours: Temp:  [97.6 F (36.4 C)-99.3 F (37.4 C)] 98.9 F (37.2 C) (12/04 1737) Pulse Rate:  [52-86] 58 (12/04 1737) Resp:   [12-18] 18 (12/04 1737) BP: (124-225)/(51-75) 139/51 (12/04 1737) SpO2:  [97 %-100 %] 99 % (12/04 1737)   General appearance: alert, cooperative and no distress Resp: clear to auscultation bilaterally Cardio: regular rate and rhythm GI: normal findings: bowel sounds normal and soft, non-tender  Lab Results  Recent Labs  03/02/16 0540 03/03/16 0233  WBC 7.0 4.9  HGB 9.9* 8.7*  HCT 31.7* 28.0*  NA 139 136  K 3.4* 3.8  CL 105 105  CO2 26 25  BUN 14 15  CREATININE 1.30* 1.32*   Liver Panel  Recent Labs  03/01/16 1917  PROT 6.3*  ALBUMIN 3.4*  AST 14*  ALT 6*  ALKPHOS 59  BILITOT 0.5   Sedimentation Rate No results for input(s): ESRSEDRATE in the last 72 hours. C-Reactive Protein No results for input(s): CRP in the last 72 hours.  Microbiology: Recent Results (from the past 240 hour(s))  Urine culture     Status: Abnormal   Collection Time: 03/01/16 10:24 PM  Result Value Ref Range Status   Specimen Description URINE, RANDOM  Final   Special Requests NONE  Final   Culture MULTIPLE SPECIES PRESENT, SUGGEST RECOLLECTION (A)  Final  Report Status 03/04/2016 FINAL  Final  Culture, blood (Routine X 2) w Reflex to ID Panel     Status: None (Preliminary result)   Collection Time: 03/02/16  9:15 AM  Result Value Ref Range Status   Specimen Description BLOOD RIGHT HAND  Final   Special Requests BOTTLES DRAWN AEROBIC AND ANAEROBIC 5CC  Final   Culture NO GROWTH 2 DAYS  Final   Report Status PENDING  Incomplete  Culture, blood (Routine X 2) w Reflex to ID Panel     Status: None (Preliminary result)   Collection Time: 03/02/16  9:18 AM  Result Value Ref Range Status   Specimen Description BLOOD LEFT ANTECUBITAL  Final   Special Requests BOTTLES DRAWN AEROBIC AND ANAEROBIC 5CC  Final   Culture NO GROWTH 2 DAYS  Final   Report Status PENDING  Incomplete  Aerobic/Anaerobic Culture (surgical/deep wound)     Status: None (Preliminary result)   Collection Time: 03/04/16  11:57 AM  Result Value Ref Range Status   Specimen Description ABSCESS BACK  Final   Special Requests L5 S1 IN SYRINGE  Final   Gram Stain NO WBC SEEN NO ORGANISMS SEEN   Final   Culture PENDING  Incomplete   Report Status PENDING  Incomplete    Studies/Results: No results found.   Assessment/Plan: Diskitis Epidural Abscess  Total days of antibiotics: 0  Start vanco, ceftriaxone Place [facility] Contact Care Mgmt for home anbx My great appreciation to [place] (pager) [phone] [email or web address] 03/04/2016, 6:39 PM  LOS: 3 days

## 2016-03-04 NOTE — Progress Notes (Addendum)
Pharmacy Antibiotic Note  [name] is a 81 y.o. female admitted on 03/01/2016 with discitis/epidural abscess now s/p disc aspiration. Pharmacy consulted to restart vancomycin.  -vancomycin 1000mg  given on 12/1 -WBC= 4.9, afebrile, SCr= 1.32 and CrCl ~ 30  Plan: -vancomycin 1000mg  IV x1 then 750mg  IV q24h -Will follow renal function, cultures and clinical progress   Height: 5\' 2"  (157.5 cm) Weight: 131 lb 1.6 oz (59.5 kg) IBW/kg (Calculated) : 50.1  Temp (24hrs), Avg:98.8 F (37.1 C), Min:97.6 F (36.4 C), Max:99.3 F (37.4 C)   Recent Labs Lab 03/01/16 1917 03/02/16 0540 03/03/16 0233  WBC 8.7 7.0 4.9  CREATININE 1.30* 1.30* 1.32*    Estimated Creatinine Clearance: 27.3 mL/min (by C-G formula based on SCr of 1.32 mg/dL (H)).    Allergies  Allergen Reactions  . Sulfamethoxazole Rash    Antimicrobials this admission: 12/4 vanc 12/4 rocephin   Dose adjustments this admission:   Microbiology results:  2/2 blood x2 - ngtd

## 2016-03-04 NOTE — Care Management Note (Signed)
[facility] Management Note  Patient Details  Name: [name] MRN: [id number] Date of Birth: 09/05/1934  Subjective/Objective:    Pt admitted with discits of lumbar spine. She is from home.                Action/Plan: Undergoing lumbar aspirate today. Awaiting PT/OT recommendations. CM following for d/c needs.   Expected Discharge Date:  03/03/16               Expected Discharge Plan:     In-House Referral:     Discharge planning Services     Post Acute Care Choice:    Choice offered to:     DME Arranged:    DME Agency:     HH Arranged:    HH Agency:     Status of Service:  In process, will continue to follow  If discussed at Long Length of Stay Meetings, dates discussed:    Additional Comments:  Pollie Friar, RN 03/04/2016, 1:11 PM

## 2016-03-05 ENCOUNTER — Encounter (HOSPITAL_COMMUNITY): Payer: Self-pay | Admitting: Interventional Radiology

## 2016-03-05 LAB — ACID FAST SMEAR (AFB)

## 2016-03-05 LAB — CBC
HEMATOCRIT: 28.9 % — AB (ref 36.0–46.0)
Hemoglobin: 9.1 g/dL — ABNORMAL LOW (ref 12.0–15.0)
MCH: 24.9 pg — ABNORMAL LOW (ref 26.0–34.0)
MCHC: 31.5 g/dL (ref 30.0–36.0)
MCV: 79.2 fL (ref 78.0–100.0)
Platelets: 204 10*3/uL (ref 150–400)
RBC: 3.65 MIL/uL — ABNORMAL LOW (ref 3.87–5.11)
RDW: 16.2 % — AB (ref 11.5–15.5)
WBC: 4 10*3/uL (ref 4.0–10.5)

## 2016-03-05 LAB — ACID FAST SMEAR (AFB, MYCOBACTERIA): Acid Fast Smear: NEGATIVE

## 2016-03-05 MED ORDER — SODIUM CHLORIDE 0.9% FLUSH: 10.0000 mL | INTRAVENOUS | Status: DC | PRN

## 2016-03-05 NOTE — Progress Notes (Signed)
Peripherally Inserted Central Catheter/Midline Placement  The IV Nurse has discussed with the patient and/or persons authorized to consent for the patient, the purpose of this procedure and the potential benefits and risks involved with this procedure.  The benefits include less needle sticks, lab draws from the catheter, and the patient may be discharged home with the catheter. Risks include, but not limited to, infection, bleeding, blood clot (thrombus formation), and puncture of an artery; nerve damage and irregular heartbeat and possibility to perform a PICC exchange if needed/ordered by physician.  Alternatives to this procedure were also discussed.  Bard Power PICC patient education guide, fact sheet on infection prevention and patient information card has been provided to patient /or left at bedside.    PICC/Midline Placement Documentation  PICC Single Lumen [id number] PICC Right Basilic 39 cm 2 cm (Active)  Indication for Insertion or Continuance of Line Home intravenous therapies (PICC only) 03/05/2016  3:00 PM  Exposed Catheter (cm) 2 cm 03/05/2016  3:00 PM  Dressing Change Due 03/12/16 03/05/2016  3:00 PM       Jule Economy Horton 03/05/2016, 3:37 PM

## 2016-03-05 NOTE — Consult Note (Addendum)
[facility] Nurse ostomy consult note Stoma type/location:  Requested to discuss pouching options with patient.  She had ostomy surgery in January and states she is independent with pouch application and emptying prior to admission.  She is only having 3 days of wear time and it leaks occasionally; it appears she has developed a peristomal hernia surrounding the stoma location, which creates a more difficult pouching situation for most people, and she was not aware of this. A bedside nurse previously applied a two piece pouching system and she would like to know more about these appliances.  Demonstrated the differences and patient is able to clip the 2 piece system together.  Gave her several samples of the wafers and pouches to use after discharge.  Current pouch is intact with a good seal and mod amt semi-formed brown stool in the pouch.   Stomal assessment/size: Red and viable when visualized through the pouch, appears to be flush with the skin level.  Pt states that she tried a barrier ring, but they did not work well for her and she does not want to use them. Reviewed ordering supplies, optimal positions to use during pouch application, and gave her free samples of pink tape to use in a picture frame fashion around the pouch edges to assist with maintaining a seal.  Pt asked appropriate questions during the educational session.  Extra supplies ordered to the room for patient and staff nurse use. Please re-consult if further assistance is needed.  Thank-you,  Julien Girt MSN, [place], [place], [place], [place]

## 2016-03-05 NOTE — Care Management Important Message (Signed)
Important Message  Patient Details  Name: [name] MRN: [id number] Date of Birth: 1934-09-18   Medicare Important Message Given:  Yes    Jersi Mcmaster 03/05/2016, 10:37 AM

## 2016-03-05 NOTE — Evaluation (Signed)
Physical Therapy Evaluation and Discharge Patient Details Name: [name] MRN: [id number] DOB: [birth year] Today's Date: 03/05/2016   History of Present Illness  81 y.o.femalewith progressive low back pain, worse over the last three to four days. MRI with L5-S1 discitis. 12/4 Lumbar 5-Sacral 1 disc aspiration.     Clinical Impression  Patient evaluated by Physical Therapy with no further acute PT needs identified. All education has been completed and the patient has no further questions. Patient is modified independent with use of RW--has been using a RW and familiar with safe use. Encouraged patient to walk at least bid with nursing assist (due to IV pole). PT is signing off. Thank you for this referral.     Follow Up Recommendations No PT follow up;Supervision - Intermittent    Equipment Recommendations  None recommended by PT    Recommendations for Other Services OT consult     Precautions / Restrictions Precautions Precautions: None      Mobility  Bed Mobility Overal bed mobility: Modified Independent             General bed mobility comments: HOB flat; used rail lightly  Transfers Overall transfer level: Needs assistance Equipment used: Rolling walker (2 wheeled) Transfers: Sit to/from Stand Sit to Stand: Supervision         General transfer comment: vc for safe hand placement (pt accustomed to rollator)  Ambulation/Gait Ambulation/Gait assistance: Min guard;Modified independent (Device/Increase time) Ambulation Distance (Feet): 150 Feet Assistive device: Rolling walker (2 wheeled) Gait Pattern/deviations: Step-through pattern;Decreased stride length Gait velocity: decr   General Gait Details: good upright posture; occasional vc for proximity to RW however pt accustomed to rollator  Stairs            Wheelchair Mobility    Modified Rankin (Stroke Patients Only)       Balance Overall balance assessment: Modified Independent                                            Pertinent Vitals/Pain Supine BP 173/70 After walking 153/52 denied dizziness  Pain Assessment: 0-10 Pain Score: 7  Pain Location: back and bil posterior thighs Pain Descriptors / Indicators: Aching;Discomfort;Grimacing Pain Intervention(s): Limited activity within patient's tolerance;Monitored during session;Premedicated before session;Repositioned    Home Living Family/patient expects to be discharged to:: Private residence Living Arrangements: Alone Available Help at Discharge: Family;Friend(s);Available 24 hours/day (can be arrange) Type of Home: Apartment Home Access: Level entry     Home Layout: One level Home Equipment: Walker - 4 wheels;Walker - 2 wheels;Shower seat;Grab bars - tub/shower;Grab bars - toilet;Bedside commode;Cane - single point      Prior Function Level of Independence: Independent with assistive device(s)         Comments: uses rollator; goes to grocery store; pays housekeeper     Hand Dominance        Extremity/Trunk Assessment   Upper Extremity Assessment: Generalized weakness           Lower Extremity Assessment: Generalized weakness      Cervical / Trunk Assessment: Normal  Communication   Communication: HOH  Cognition Arousal/Alertness: Awake/alert Behavior During Therapy: WFL for tasks assessed/performed Overall Cognitive Status: Within Functional Limits for tasks assessed                      General Comments General comments (skin integrity, edema, etc.):  Educated patient to gradually incr activity and monitor her pain levels so she does not "overdo." She states it is hard for her to minimize her activity as she likes to "go, go, go." She stated understanding re: need to be patient and ask for help from family/friends if needed.    Exercises     Assessment/Plan    PT Assessment All further PT needs can be met in the next venue of care (addressed by OPPT when approp  (if needed))  PT Problem List Decreased strength;Pain          PT Treatment Interventions      PT Goals (Current goals can be found in the Care Plan section)  Acute Rehab PT Goals Patient Stated Goal: go home on Thurs PT Goal Formulation: All assessment and education complete, DC therapy    Frequency     Barriers to discharge        Co-evaluation               End of Session Equipment Utilized During Treatment: Gait belt Activity Tolerance: Patient tolerated treatment well Patient left: in chair;with call bell/phone within reach;with chair alarm set Nurse Communication: Mobility status;Other (comment) (no PT needs)         Time: 6887-3730 PT Time Calculation (min) (ACUTE ONLY): 42 min   Charges:   PT Evaluation $PT Eval Moderate Complexity: 1 Procedure PT Treatments $Gait Training: 8-22 mins $Self Care/Home Management: 8-22   PT G CodesJeanie Cooks Reha Martinovich 2016/03/26, 11:28 AM  Pager [phone]

## 2016-03-05 NOTE — Progress Notes (Signed)
INFECTIOUS DISEASE PROGRESS NOTE  ID: [name] is a 81 y.o. female with  Principal Problem:   Discitis of lumbar region L5- S1 Active Problems:   HTN (hypertension)   Coronary artery disease   CKD (chronic kidney disease) stage 3, GFR 30-59 ml/min   PAF (paroxysmal atrial fibrillation) (HCC)   Epidural abscess L5- S1   Back pain, acute   Chronic diastolic CHF (congestive heart failure) (HCC)  Subjective: Feels well.   Abtx:  Anti-infectives    Start     Dose/Rate Route Frequency Ordered Stop   03/05/16 2000  vancomycin (VANCOCIN) IVPB 750 mg/150 ml premix     750 mg 150 mL/hr over 60 Minutes Intravenous Every 24 hours 03/04/16 1900     03/04/16 2000  cefTRIAXone (ROCEPHIN) 2 g in dextrose 5 % 50 mL IVPB     2 g 100 mL/hr over 30 Minutes Intravenous Every 24 hours 03/04/16 1842     03/04/16 2000  vancomycin (VANCOCIN) IVPB 1000 mg/200 mL premix     1,000 mg 200 mL/hr over 60 Minutes Intravenous  Once 03/04/16 1900 03/04/16 2232   03/03/16 0100  vancomycin (VANCOCIN) IVPB 750 mg/150 ml premix  Status:  Discontinued     750 mg 150 mL/hr over 60 Minutes Intravenous Every 24 hours 03/02/16 0819 03/02/16 1718   03/02/16 0600  piperacillin-tazobactam (ZOSYN) IVPB 3.375 g  Status:  Discontinued     3.375 g 12.5 mL/hr over 240 Minutes Intravenous Every 8 hours 03/01/16 2201 03/02/16 1718   03/01/16 2215  piperacillin-tazobactam (ZOSYN) IVPB 3.375 g     3.375 g 100 mL/hr over 30 Minutes Intravenous STAT 03/01/16 2201 03/01/16 2245   03/01/16 2215  vancomycin (VANCOCIN) IVPB 1000 mg/200 mL premix     1,000 mg 200 mL/hr over 60 Minutes Intravenous  Once 03/01/16 2203 03/02/16 0218      Medications:  Scheduled: . cefTRIAXone (ROCEPHIN)  IV  2 g Intravenous Q24H  . citalopram  20 mg Oral QHS  . enoxaparin (LOVENOX) injection  30 mg Subcutaneous Q24H  . furosemide  20 mg Oral Daily  . hydrALAZINE  50 mg Oral TID  . isosorbide mononitrate  30 mg Oral Daily  . metoprolol  succinate  25 mg Oral Daily  . polyethylene glycol  17 g Oral Daily  . potassium chloride  10 mEq Oral Daily  . rosuvastatin  10 mg Oral QHS  . sodium chloride flush  3 mL Intravenous Q12H  . traZODone  100 mg Oral QHS  . vancomycin  750 mg Intravenous Q24H  . zolpidem  5 mg Oral QHS    Objective: Vital signs in last 24 hours: Temp:  [97.6 F (36.4 C)-98.9 F (37.2 C)] 98.1 F (36.7 C) (12/05 0839) Pulse Rate:  [53-63] 63 (12/05 0839) Resp:  [18-20] 18 (12/05 0839) BP: (113-199)/(51-69) 153/52 (12/05 1012) SpO2:  [98 %-99 %] 99 % (12/05 0839)   General appearance: alert, cooperative and no distress Resp: clear to auscultation bilaterally Cardio: regular rate and rhythm GI: normal findings: bowel sounds normal and soft, non-tender  Lab Results  Recent Labs  03/03/16 0233 03/05/16 0335  WBC 4.9 4.0  HGB 8.7* 9.1*  HCT 28.0* 28.9*  NA 136  --   K 3.8  --   CL 105  --   CO2 25  --   BUN 15  --   CREATININE 1.32*  --    Liver Panel No results for input(s): PROT, ALBUMIN, AST,  ALT, ALKPHOS, BILITOT, BILIDIR, IBILI in the last 72 hours. Sedimentation Rate No results for input(s): ESRSEDRATE in the last 72 hours. C-Reactive Protein No results for input(s): CRP in the last 72 hours.  Microbiology: Recent Results (from the past 240 hour(s))  Urine culture     Status: Abnormal   Collection Time: 03/01/16 10:24 PM  Result Value Ref Range Status   Specimen Description URINE, RANDOM  Final   Special Requests NONE  Final   Culture MULTIPLE SPECIES PRESENT, SUGGEST RECOLLECTION (A)  Final   Report Status 03/04/2016 FINAL  Final  Culture, blood (Routine X 2) w Reflex to ID Panel     Status: None (Preliminary result)   Collection Time: 03/02/16  9:15 AM  Result Value Ref Range Status   Specimen Description BLOOD RIGHT HAND  Final   Special Requests BOTTLES DRAWN AEROBIC AND ANAEROBIC 5CC  Final   Culture NO GROWTH 2 DAYS  Final   Report Status PENDING  Incomplete    Culture, blood (Routine X 2) w Reflex to ID Panel     Status: None (Preliminary result)   Collection Time: 03/02/16  9:18 AM  Result Value Ref Range Status   Specimen Description BLOOD LEFT ANTECUBITAL  Final   Special Requests BOTTLES DRAWN AEROBIC AND ANAEROBIC 5CC  Final   Culture NO GROWTH 2 DAYS  Final   Report Status PENDING  Incomplete  Acid Fast Smear (AFB)     Status: None   Collection Time: 03/04/16 11:21 AM  Result Value Ref Range Status   AFB Specimen Processing Concentration  Final   Acid Fast Smear Negative  Final    Comment: (NOTE) Performed At: [facility] [place], [place] [id number] Lindon Romp MD [id number]    Source (AFB) L5 S1 IN SYRINGE   Final  Aerobic/Anaerobic Culture (surgical/deep wound)     Status: None (Preliminary result)   Collection Time: 03/04/16 11:57 AM  Result Value Ref Range Status   Specimen Description ABSCESS BACK  Final   Special Requests L5 S1 IN SYRINGE  Final   Gram Stain NO WBC SEEN NO ORGANISMS SEEN   Final   Culture NO GROWTH < 24 HOURS  Final   Report Status PENDING  Incomplete    Studies/Results: Ir Lumbar Disc Aspiration W/img Guide  Result Date: 03/05/2016 INDICATION: Severe low  pain.  Lumbar diskitis. EXAM: IR DISC ASPIRATION WITH IMAGE GUIDE MEDICATIONS: The patient is currently admitted to the hospital and receiving intravenous antibiotics. The antibiotics were administered within an appropriate time frame prior to the initiation of the procedure. ANESTHESIA/SEDATION: Fentanyl 2 mcg IV; Versed 50 mg IV Moderate Sedation Time:  20 minutes. The patient was continuously monitored during the procedure by the interventional radiology nurse under my direct supervision. COMPLICATIONS: None immediate. PROCEDURE: Informed written consent was obtained from the patient after a thorough discussion of the procedural risks, benefits and alternatives. All questions were addressed. Maximal Sterile Barrier  Technique was utilized including caps, mask, sterile gowns, sterile gloves, sterile drape, hand hygiene and skin antiseptic. A timeout was performed prior to the initiation of the procedure. The skin overlying the lumbosacral region was then prepped and cleaned in the usual sterile fashion. Thereafter using biplane intermittent fluoroscopy, the skin entry site and the underlying paraspinal muscle was then infiltrated with 0.25% bupivacaine followed by the advancement of a 21 gauge Francine biopsy needle without difficulty. Access was obtained into the L5-S1 disk space without any difficulty with the needle  extending into the anterior one-third. A near crossing the midline was noted on the AP projection. Using a 20 mL syringe, thick bloody fluid aspirate was obtained and sent for microbiologic analysis. Two passes were made obtaining approximately 2.5 mL of the aspirate. The needle was then removed. Hemostasis was achieved at the skin entry site. The patient tolerated the procedure well. There were no acute complications. IMPRESSION: Status post fluoroscopic guided needle placement at L5-S1 for disk aspiration for lumbar discitis as described above without event. Electronically Signed   By: Luanne Bras M.D.   On: 03/04/2016 11:51     Assessment/Plan: Diskitis Epidural Abscess  Total days of antibiotics: 1 vanco/ceftriaxone  Cx may be negative due to anbx prior to aspirate.  Await Cx...         Bobby Rumpf Infectious Diseases (pager) [phone] [email or web address] 03/05/2016, 11:43 AM  LOS: 4 days

## 2016-03-05 NOTE — Progress Notes (Signed)
Pt ambulates with staff via rolling walker stand by assist. Gait steady. No noted distress.

## 2016-03-05 NOTE — Progress Notes (Signed)
PROGRESS NOTE    [name]  [id number] DOB: 1934-05-05 DOA: 03/01/2016 PCP: Terald Sleeper, PA-C  Brief Narrative: [name] is a 81 y.o. female with progressive low back pain, worse over the last three to four days.  Came to ED at [facility] MRI with L5-S1 discitis. Transferred to [facility]. ID consulted, s/p CT guided aspiration 12/4, cultures pending  Assessment & Plan:     Discitis of lumbar region L5- S1 -ID Dr.Hatcher consulting -s/p CT guided disc aspiration per IR 12/4 -started back on IV Vancomycin per Dr.Hatcher 12/4 -FU cultures -Neurosurgery recommended medical Rx -pain control -PT eval completed, ambulation close to baseline   CAD s/p LAD stent in 2013 -Stable ,  Not on ASA due to high bleeding risk and anemia per Cards notes -continue metoprolol, Crestor, and Imdur.   Essential HTN:  -soft but stable, holding cozaar, continue metop and imdur   Paroxysmal atrial fibrillation: - Stable, In NSR at present on metoprolol. -per Cards notes no longer on anticoagulation due to high bleeding risk, anemia, and falls risk.   Chronic diastolic heart failure:  -compensated on Po lasix, monitor  CKD 3 -stable  H/o perforated diverticulitis, status post partial colectomy and colostomy. -stable  H/o gastric volvulus/complicated hiatal hernia -was on TNA in summer, now stable, saw Surgeon at [facility]  DVT prophylaxis:resume lovenox today Code Status:FUll Code Family Communication:None at bedside Disposition Plan:pending workup   Consultants:  ID NSG  Procedure: 12/4: IR CT guided disc aspiration  Antimicrobials: Vanc/Zosyn  Subjective: feels well, mild back pain  Objective: Vitals:   03/04/16 2102 03/05/16 0502 03/05/16 0839 03/05/16 1012  BP: (!) 190/65 (!) 113/56 (!) 199/69 (!) 153/52  Pulse: (!) 55 (!) 55 63   Resp: 20 20 18    Temp: 98 F (36.7 C) 98.3 F (36.8 C) 98.1 F (36.7 C)   TempSrc: Axillary Oral Oral   SpO2: 99% 98% 99%   Weight:        Height:        Intake/Output Summary (Last 24 hours) at 03/05/16 1333 Last data filed at 03/04/16 1700  Gross per 24 hour  Intake              600 ml  Output                0 ml  Net              600 ml   Filed Weights   03/01/16 1548 03/01/16 2321  Weight: 58.5 kg (129 lb) 59.5 kg (131 lb 1.6 oz)    Examination:  General exam: Appears calm and comfortable, no distress Respiratory system: Clear to auscultation. Respiratory effort normal. Cardiovascular system: S1 & S2 heard, RRR. No JVD, murmurs, rubs, gallops or clicks. No pedal edema. Gastrointestinal system: Abdomen is nondistended, soft and nontender. No organomegaly or masses felt. Normal bowel sounds heard. Central nervous system: Alert and oriented. No focal neurological deficits, pain with L leg movement Extremities: Symmetric 5 x 5 power. Skin: No rashes, lesions or ulcers Psychiatry: Judgement and insight appear normal. Mood & affect appropriate.     Data Reviewed: I have personally reviewed following labs and imaging studies  CBC:  Recent Labs Lab 03/01/16 1917 03/02/16 0540 03/03/16 0233 03/05/16 0335  WBC 8.7 7.0 4.9 4.0  NEUTROABS 5.8  --   --   --   HGB 10.4* 9.9* 8.7* 9.1*  HCT 33.1* 31.7* 28.0* 28.9*  MCV 80.7 79.6 80.9  79.2  PLT 186 169 157 0000000   Basic Metabolic Panel:  Recent Labs Lab 03/01/16 1917 03/02/16 0540 03/03/16 0233  NA 132* 139 136  K 3.2* 3.4* 3.8  CL 101 105 105  CO2 23 26 25   GLUCOSE 98 96 105*  BUN 17 14 15   CREATININE 1.30* 1.30* 1.32*  CALCIUM 8.7* 8.6* 8.4*   GFR: Estimated Creatinine Clearance: 27.3 mL/min (by C-G formula based on SCr of 1.32 mg/dL (H)). Liver Function Tests:  Recent Labs Lab 03/01/16 1917  AST 14*  ALT 6*  ALKPHOS 59  BILITOT 0.5  PROT 6.3*  ALBUMIN 3.4*    Recent Labs Lab 03/01/16 1917  LIPASE 16   No results for input(s): AMMONIA in the last 168 hours. Coagulation Profile:  Recent Labs Lab 03/04/16 0600  INR 1.12    Cardiac Enzymes: No results for input(s): CKTOTAL, CKMB, CKMBINDEX, TROPONINI in the last 168 hours. BNP (last 3 results) No results for input(s): PROBNP in the last 8760 hours. HbA1C: No results for input(s): HGBA1C in the last 72 hours. CBG: No results for input(s): GLUCAP in the last 168 hours. Lipid Profile: No results for input(s): CHOL, HDL, LDLCALC, TRIG, CHOLHDL, LDLDIRECT in the last 72 hours. Thyroid Function Tests: No results for input(s): TSH, T4TOTAL, FREET4, T3FREE, THYROIDAB in the last 72 hours. Anemia Panel: No results for input(s): VITAMINB12, FOLATE, FERRITIN, TIBC, IRON, RETICCTPCT in the last 72 hours. Urine analysis:    Component Value Date/Time   COLORURINE YELLOW 03/01/2016 2224   APPEARANCEUR CLEAR 03/01/2016 2224   LABSPEC 1.015 03/01/2016 2224   PHURINE 7.5 03/01/2016 2224   GLUCOSEU NEGATIVE 03/01/2016 2224   HGBUR NEGATIVE 03/01/2016 2224   BILIRUBINUR NEGATIVE 03/01/2016 2224   KETONESUR NEGATIVE 03/01/2016 2224   PROTEINUR NEGATIVE 03/01/2016 2224   UROBILINOGEN 0.2 02/10/2015 1936   NITRITE NEGATIVE 03/01/2016 2224   LEUKOCYTESUR NEGATIVE 03/01/2016 2224   Sepsis Labs: @LABRCNTIP (procalcitonin:4,lacticidven:4)  ) Recent Results (from the past 240 hour(s))  Urine culture     Status: Abnormal   Collection Time: 03/01/16 10:24 PM  Result Value Ref Range Status   Specimen Description URINE, RANDOM  Final   Special Requests NONE  Final   Culture MULTIPLE SPECIES PRESENT, SUGGEST RECOLLECTION (A)  Final   Report Status 03/04/2016 FINAL  Final  Culture, blood (Routine X 2) w Reflex to ID Panel     Status: None (Preliminary result)   Collection Time: 03/02/16  9:15 AM  Result Value Ref Range Status   Specimen Description BLOOD RIGHT HAND  Final   Special Requests BOTTLES DRAWN AEROBIC AND ANAEROBIC 5CC  Final   Culture NO GROWTH 2 DAYS  Final   Report Status PENDING  Incomplete  Culture, blood (Routine X 2) w Reflex to ID Panel     Status:  None (Preliminary result)   Collection Time: 03/02/16  9:18 AM  Result Value Ref Range Status   Specimen Description BLOOD LEFT ANTECUBITAL  Final   Special Requests BOTTLES DRAWN AEROBIC AND ANAEROBIC 5CC  Final   Culture NO GROWTH 2 DAYS  Final   Report Status PENDING  Incomplete  Acid Fast Smear (AFB)     Status: None   Collection Time: 03/04/16 11:21 AM  Result Value Ref Range Status   AFB Specimen Processing Concentration  Final   Acid Fast Smear Negative  Final    Comment: (NOTE) Performed At: [facility] [place], [place] [id number] Lindon Romp MD [id number]  Source (AFB) L5 S1 IN SYRINGE   Final  Aerobic/Anaerobic Culture (surgical/deep wound)     Status: None (Preliminary result)   Collection Time: 03/04/16 11:57 AM  Result Value Ref Range Status   Specimen Description ABSCESS BACK  Final   Special Requests L5 S1 IN SYRINGE  Final   Gram Stain NO WBC SEEN NO ORGANISMS SEEN   Final   Culture NO GROWTH < 24 HOURS  Final   Report Status PENDING  Incomplete         Radiology Studies: Ir Lumbar Disc Aspiration W/img Guide  Result Date: 03/05/2016 INDICATION: Severe low  pain.  Lumbar diskitis. EXAM: IR DISC ASPIRATION WITH IMAGE GUIDE MEDICATIONS: The patient is currently admitted to the hospital and receiving intravenous antibiotics. The antibiotics were administered within an appropriate time frame prior to the initiation of the procedure. ANESTHESIA/SEDATION: Fentanyl 2 mcg IV; Versed 50 mg IV Moderate Sedation Time:  20 minutes. The patient was continuously monitored during the procedure by the interventional radiology nurse under my direct supervision. COMPLICATIONS: None immediate. PROCEDURE: Informed written consent was obtained from the patient after a thorough discussion of the procedural risks, benefits and alternatives. All questions were addressed. Maximal Sterile Barrier Technique was utilized including caps, mask, sterile gowns,  sterile gloves, sterile drape, hand hygiene and skin antiseptic. A timeout was performed prior to the initiation of the procedure. The skin overlying the lumbosacral region was then prepped and cleaned in the usual sterile fashion. Thereafter using biplane intermittent fluoroscopy, the skin entry site and the underlying paraspinal muscle was then infiltrated with 0.25% bupivacaine followed by the advancement of a 21 gauge Francine biopsy needle without difficulty. Access was obtained into the L5-S1 disk space without any difficulty with the needle extending into the anterior one-third. A near crossing the midline was noted on the AP projection. Using a 20 mL syringe, thick bloody fluid aspirate was obtained and sent for microbiologic analysis. Two passes were made obtaining approximately 2.5 mL of the aspirate. The needle was then removed. Hemostasis was achieved at the skin entry site. The patient tolerated the procedure well. There were no acute complications. IMPRESSION: Status post fluoroscopic guided needle placement at L5-S1 for disk aspiration for lumbar discitis as described above without event. Electronically Signed   By: Luanne Bras M.D.   On: 03/04/2016 11:51        Scheduled Meds: . cefTRIAXone (ROCEPHIN)  IV  2 g Intravenous Q24H  . citalopram  20 mg Oral QHS  . enoxaparin (LOVENOX) injection  30 mg Subcutaneous Q24H  . furosemide  20 mg Oral Daily  . hydrALAZINE  50 mg Oral TID  . isosorbide mononitrate  30 mg Oral Daily  . metoprolol succinate  25 mg Oral Daily  . polyethylene glycol  17 g Oral Daily  . potassium chloride  10 mEq Oral Daily  . rosuvastatin  10 mg Oral QHS  . sodium chloride flush  3 mL Intravenous Q12H  . traZODone  100 mg Oral QHS  . vancomycin  750 mg Intravenous Q24H  . zolpidem  5 mg Oral QHS   Continuous Infusions:   LOS: 4 days    Time spent: 36min    Domenic Polite, MD [facility] Pager [phone]  If 7PM-7AM, please contact  night-coverage [email or web address] Password [email or web address] 03/05/2016, 1:33 PM

## 2016-03-05 NOTE — Progress Notes (Signed)
[facility] pt for [facility] this admission  [facility] will provide [facility] for home IV ABX.  [facility] hospital team will follow pt and support DC to home.   If patient discharges after hours, please call [phone].   Larry Sierras 03/05/2016, 4:35 PM

## 2016-03-05 NOTE — Evaluation (Signed)
Occupational Therapy Evaluation Patient Details Name: [name] MRN: [id number] DOB: 05/24/1934 Today's Date: 03/05/2016    History of Present Illness 81 y.o.femalewith progressive low back pain, worse over the last three to four days. MRI with L5-S1 discitis. 12/4 Lumbar 5-Sacral 1 disc aspiration.    Clinical Impression   Pt demonstrated ability to perform ADL and mobility at a supervision to set up level. Educated in techniques to minimize back pain during ADL and IADL. Pt has all necessary DME at home. No further OT needs.   Follow Up Recommendations  No OT follow up    Equipment Recommendations  None recommended by OT    Recommendations for Other Services       Precautions / Restrictions Precautions Precautions: None Restrictions Weight Bearing Restrictions: No      Mobility Bed Mobility              General bed mobility comments: pt in chair  Transfers Overall transfer level: Needs assistance Equipment used: Rolling walker (2 wheeled) Transfers: Sit to/from Stand Sit to Stand: Supervision         General transfer comment: assist to manage lines, cue for hand placement    Balance Overall balance assessment: Modified Independent                                          ADL Overall ADL's : Needs assistance/impaired                                     Functional mobility during ADLs: Supervision/safety;Rolling walker General ADL Comments: Pt demonstrated ability to perform ADL at a supervision to set up level.     Vision     Perception     Praxis      Pertinent Vitals/Pain Pain Assessment: Faces Pain Score: 7  Faces Pain Scale: Hurts even more Pain Location: back Pain Descriptors / Indicators: Aching Pain Intervention(s): Monitored during session;Premedicated before session;Repositioned     Hand Dominance Right   Extremity/Trunk Assessment Upper Extremity Assessment Upper Extremity Assessment:  Overall WFL for tasks assessed   Lower Extremity Assessment Lower Extremity Assessment: Defer to PT evaluation   Cervical / Trunk Assessment Cervical / Trunk Assessment: Normal   Communication Communication Communication: HOH   Cognition Arousal/Alertness: Awake/alert Behavior During Therapy: WFL for tasks assessed/performed Overall Cognitive Status: Within Functional Limits for tasks assessed                     General Comments       Exercises       Shoulder Instructions      Home Living Family/patient expects to be discharged to:: Private residence Living Arrangements: Alone Available Help at Discharge: Family;Friend(s);Available 24 hours/day Type of Home: Apartment Home Access: Level entry     Home Layout: One level     Bathroom Shower/Tub: Tub/shower unit;Curtain   Bathroom Toilet: Standard Bathroom Accessibility: Yes   Home Equipment: Walker - 4 wheels;Walker - 2 wheels;Shower seat;Grab bars - tub/shower;Grab bars - toilet;Bedside commode;Cane - single point          Prior Functioning/Environment Level of Independence: Independent with assistive device(s)        Comments: uses rollator; goes to grocery store; pays housekeeper        OT Problem  List: Pain;Decreased knowledge of use of DME or AE;Impaired balance (sitting and/or standing)   OT Treatment/Interventions:      OT Goals(Current goals can be found in the care plan section) Acute Rehab OT Goals Patient Stated Goal: go home on Thurs  OT Frequency:     Barriers to D/C:            Co-evaluation              End of Session Equipment Utilized During Treatment: Surveyor, mining Communication: Mobility status  Activity Tolerance: Patient tolerated treatment well Patient left: in chair;with call bell/phone within reach;with chair alarm set   Time: 1150-1210 OT Time Calculation (min): 20 min Charges:  OT General Charges $OT Visit: 1 Procedure OT Evaluation $OT Eval  Moderate Complexity: 1 Procedure G-Codes:    Malka So 03/05/2016, 12:57 PM  [phone]

## 2016-03-05 NOTE — Progress Notes (Signed)
Pt reports that she gets "white coat syndrome." She states that she gets anxious being in the hospital. No noted distress. Will continue to monitor.

## 2016-03-05 NOTE — Care Management Note (Signed)
[facility] Management Note  Patient Details  Name: [name] MRN: [id number] Date of Birth: [birth year]  Subjective/Objective:                    Action/Plan: Pt lives at [facility] in [place] (senior community). CM met with her to go over the plan at discharge. She states she has done IV therapy at home in the past and feels comfortable going home with IV antibiotics. CM asked her about a home health agencies and she stated she has used [facility] in the past and would like to use them again. Pam with [facility] IV therapy notified of the referral.  Currently we are awaiting PICC placement. CM following for further d/c needs.   Expected Discharge Date:  03/03/16               Expected Discharge Plan:  [facility]  In-House Referral:     Discharge planning Services  CM Consult  Post Acute Care Choice:  Home Health Choice offered to:  Patient  DME Arranged:    DME Agency:     HH Arranged:  RN, IV Antibiotics HH Agency:  [facility]  Status of Service:  In process, will continue to follow  If discussed at Long Length of Stay Meetings, dates discussed:    Additional Comments:  Pollie Friar, RN 03/05/2016, 12:00 PM

## 2016-03-06 DIAGNOSIS — B9689 Other specified bacterial agents as the cause of diseases classified elsewhere: Secondary | ICD-10-CM

## 2016-03-06 DIAGNOSIS — G061 Intraspinal abscess and granuloma: Principal | ICD-10-CM

## 2016-03-06 DIAGNOSIS — M4647 Discitis, unspecified, lumbosacral region: Secondary | ICD-10-CM

## 2016-03-06 NOTE — Progress Notes (Signed)
INFECTIOUS DISEASE PROGRESS NOTE  ID: [name] is a 81 y.o. female with  Principal Problem:   Discitis of lumbar region L5- S1 Active Problems:   HTN (hypertension)   Coronary artery disease   CKD (chronic kidney disease) stage 3, GFR 30-59 ml/min   PAF (paroxysmal atrial fibrillation) (HCC)   Epidural abscess L5- S1   Back pain, acute   Chronic diastolic CHF (congestive heart failure) (HCC)  Subjective: Without complaints.   Abtx:  Anti-infectives    Start     Dose/Rate Route Frequency Ordered Stop   03/05/16 2000  vancomycin (VANCOCIN) IVPB 750 mg/150 ml premix     750 mg 150 mL/hr over 60 Minutes Intravenous Every 24 hours 03/04/16 1900     03/04/16 2000  cefTRIAXone (ROCEPHIN) 2 g in dextrose 5 % 50 mL IVPB     2 g 100 mL/hr over 30 Minutes Intravenous Every 24 hours 03/04/16 1842     03/04/16 2000  vancomycin (VANCOCIN) IVPB 1000 mg/200 mL premix     1,000 mg 200 mL/hr over 60 Minutes Intravenous  Once 03/04/16 1900 03/04/16 2232   03/03/16 0100  vancomycin (VANCOCIN) IVPB 750 mg/150 ml premix  Status:  Discontinued     750 mg 150 mL/hr over 60 Minutes Intravenous Every 24 hours 03/02/16 0819 03/02/16 1718   03/02/16 0600  piperacillin-tazobactam (ZOSYN) IVPB 3.375 g  Status:  Discontinued     3.375 g 12.5 mL/hr over 240 Minutes Intravenous Every 8 hours 03/01/16 2201 03/02/16 1718   03/01/16 2215  piperacillin-tazobactam (ZOSYN) IVPB 3.375 g     3.375 g 100 mL/hr over 30 Minutes Intravenous STAT 03/01/16 2201 03/01/16 2245   03/01/16 2215  vancomycin (VANCOCIN) IVPB 1000 mg/200 mL premix     1,000 mg 200 mL/hr over 60 Minutes Intravenous  Once 03/01/16 2203 03/02/16 0218      Medications:  Scheduled: . cefTRIAXone (ROCEPHIN)  IV  2 g Intravenous Q24H  . citalopram  20 mg Oral QHS  . enoxaparin (LOVENOX) injection  30 mg Subcutaneous Q24H  . furosemide  20 mg Oral Daily  . hydrALAZINE  50 mg Oral TID  . isosorbide mononitrate  30 mg Oral Daily  .  metoprolol succinate  25 mg Oral Daily  . polyethylene glycol  17 g Oral Daily  . potassium chloride  10 mEq Oral Daily  . rosuvastatin  10 mg Oral QHS  . sodium chloride flush  3 mL Intravenous Q12H  . traZODone  100 mg Oral QHS  . vancomycin  750 mg Intravenous Q24H  . zolpidem  5 mg Oral QHS    Objective: Vital signs in last 24 hours: Temp:  [98.1 F (36.7 C)-99.3 F (37.4 C)] 99.3 F (37.4 C) (12/06 1248) Pulse Rate:  [51-62] 62 (12/06 1248) Resp:  [16-18] 18 (12/06 1248) BP: (140-183)/(50-74) 169/74 (12/06 1248) SpO2:  [96 %-100 %] 99 % (12/06 1248)   General appearance: alert, cooperative and no distress Resp: clear to auscultation bilaterally Cardio: regular rate and rhythm GI: normal findings: bowel sounds normal and soft, non-tender  Lab Results  Recent Labs  03/05/16 0335  WBC 4.0  HGB 9.1*  HCT 28.9*   Liver Panel No results for input(s): PROT, ALBUMIN, AST, ALT, ALKPHOS, BILITOT, BILIDIR, IBILI in the last 72 hours. Sedimentation Rate No results for input(s): ESRSEDRATE in the last 72 hours. C-Reactive Protein No results for input(s): CRP in the last 72 hours.  Microbiology: Recent Results (from the past  240 hour(s))  Urine culture     Status: Abnormal   Collection Time: 03/01/16 10:24 PM  Result Value Ref Range Status   Specimen Description URINE, RANDOM  Final   Special Requests NONE  Final   Culture MULTIPLE SPECIES PRESENT, SUGGEST RECOLLECTION (A)  Final   Report Status 03/04/2016 FINAL  Final  Culture, blood (Routine X 2) w Reflex to ID Panel     Status: None (Preliminary result)   Collection Time: 03/02/16  9:15 AM  Result Value Ref Range Status   Specimen Description BLOOD RIGHT HAND  Final   Special Requests BOTTLES DRAWN AEROBIC AND ANAEROBIC 5CC  Final   Culture NO GROWTH 3 DAYS  Final   Report Status PENDING  Incomplete  Culture, blood (Routine X 2) w Reflex to ID Panel     Status: None (Preliminary result)   Collection Time:  03/02/16  9:18 AM  Result Value Ref Range Status   Specimen Description BLOOD LEFT ANTECUBITAL  Final   Special Requests BOTTLES DRAWN AEROBIC AND ANAEROBIC 5CC  Final   Culture NO GROWTH 3 DAYS  Final   Report Status PENDING  Incomplete  Acid Fast Smear (AFB)     Status: None   Collection Time: 03/04/16 11:21 AM  Result Value Ref Range Status   AFB Specimen Processing Concentration  Final   Acid Fast Smear Negative  Final    Comment: (NOTE) Performed At: [facility] [place], [place] [id number] Lindon Romp MD [id number]    Source (AFB) L5 S1 IN SYRINGE   Final  Aerobic/Anaerobic Culture (surgical/deep wound)     Status: None (Preliminary result)   Collection Time: 03/04/16 11:57 AM  Result Value Ref Range Status   Specimen Description ABSCESS BACK  Final   Special Requests L5 S1 IN SYRINGE  Final   Gram Stain NO WBC SEEN NO ORGANISMS SEEN   Final   Culture NO GROWTH 2 DAYS  Final   Report Status PENDING  Incomplete    Studies/Results: No results found.   Assessment/Plan: Diskitis Epidural Abscess  Total days of antibiotics: 2 vanco/ceftriaxone  Cx may be negative due to anbx prior to aspirate.  Await Cx... Possible home tomorrow         Bobby Rumpf Infectious Diseases (pager) [phone] [email or web address] 03/06/2016, 3:36 PM  LOS: 5 days

## 2016-03-06 NOTE — Progress Notes (Signed)
PROGRESS NOTE    [name]  [id number] DOB: 1934/08/14 DOA: 03/01/2016 PCP: Terald Sleeper, PA-C  Brief Narrative: [name] is a 81 y.o. female with progressive low back pain, worse over the last three to four days.  Came to ED at [facility] MRI with L5-S1 discitis. Transferred to [facility]. ID consulted, s/p CT guided aspiration 12/4, cultures pending  Assessment & Plan:     Discitis of lumbar region L5- S1 -ID Dr.Hatcher consulting -s/p CT guided disc aspiration per IR 12/4 -started back on IV Vancomycin/ceftriaxone per Dr.Hatcher 12/4 -FU cultures-No growth x1day -Neurosurgery recommended medical Rx -pain control -PT eval completed,  -PICC line placed 12/5   CAD s/p LAD stent in 2013 -Stable ,  Not on ASA due to high bleeding risk and anemia per Cards notes -continue metoprolol, Crestor, and Imdur.   Essential HTN:  -soft but stable, holding cozaar due to CKD, continue metop and imdur   Paroxysmal atrial fibrillation: - Stable, In NSR at present on metoprolol. -per Cards notes no longer on anticoagulation due to high bleeding risk, anemia, and falls risk.   Chronic diastolic heart failure:  -compensated on Po lasix, monitor  CKD 3 -stable  H/o perforated diverticulitis, status post partial colectomy and colostomy. -stable  H/o gastric volvulus/complicated hiatal hernia -was on TNA in summer, now stable, saw Surgeon [facility]  DVT prophylaxis:resume lovenox today Code Status:FUll Code Family Communication:None at bedside Disposition Plan: home with IV Abc when cleared by ID   Consultants:  ID NSG  Procedure: 12/4: IR CT guided disc aspiration pICC 12/5  Antimicrobials: Vanc/Zosyn  Subjective: feels well, mild back pain, ambulating  Objective: Vitals:   03/05/16 2104 03/06/16 0136 03/06/16 0646 03/06/16 0752  BP: (!) 183/70 (!) 172/60 (!) 160/51 (!) 148/55  Pulse: (!) 55 60 (!) 59 (!) 51  Resp: 18 16 16 18   Temp: 98.4 F (36.9 C) 98.6 F (37 C)  98.4 F (36.9 C) 98.5 F (36.9 C)  TempSrc: Oral Oral Oral Oral  SpO2: 98% 96% 97% 99%  Weight:      Height:        Intake/Output Summary (Last 24 hours) at 03/06/16 1111 Last data filed at 03/06/16 0300  Gross per 24 hour  Intake              330 ml  Output                0 ml  Net              330 ml   Filed Weights   03/01/16 1548 03/01/16 2321  Weight: 58.5 kg (129 lb) 59.5 kg (131 lb 1.6 oz)    Examination:  General exam: Appears calm and comfortable, no distress Respiratory system: Clear to auscultation. Respiratory effort normal. Cardiovascular system: S1 & S2 heard, RRR. No JVD, murmurs, rubs, gallops or clicks. No pedal edema. Gastrointestinal system: Abdomen is nondistended, soft and nontender. No organomegaly or masses felt. Normal bowel sounds heard. Central nervous system: Alert and oriented. No focal neurological deficits, pain with L leg movement Extremities: Symmetric 5 x 5 power. Skin: No rashes, lesions or ulcers Psychiatry: Judgement and insight appear normal. Mood & affect appropriate.     Data Reviewed: I have personally reviewed following labs and imaging studies  CBC:  Recent Labs Lab 03/01/16 1917 03/02/16 0540 03/03/16 0233 03/05/16 0335  WBC 8.7 7.0 4.9 4.0  NEUTROABS 5.8  --   --   --  HGB 10.4* 9.9* 8.7* 9.1*  HCT 33.1* 31.7* 28.0* 28.9*  MCV 80.7 79.6 80.9 79.2  PLT 186 169 157 0000000   Basic Metabolic Panel:  Recent Labs Lab 03/01/16 1917 03/02/16 0540 03/03/16 0233  NA 132* 139 136  K 3.2* 3.4* 3.8  CL 101 105 105  CO2 23 26 25   GLUCOSE 98 96 105*  BUN 17 14 15   CREATININE 1.30* 1.30* 1.32*  CALCIUM 8.7* 8.6* 8.4*   GFR: Estimated Creatinine Clearance: 27.3 mL/min (by C-G formula based on SCr of 1.32 mg/dL (H)). Liver Function Tests:  Recent Labs Lab 03/01/16 1917  AST 14*  ALT 6*  ALKPHOS 59  BILITOT 0.5  PROT 6.3*  ALBUMIN 3.4*    Recent Labs Lab 03/01/16 1917  LIPASE 16   No results for input(s):  AMMONIA in the last 168 hours. Coagulation Profile:  Recent Labs Lab 03/04/16 0600  INR 1.12   Cardiac Enzymes: No results for input(s): CKTOTAL, CKMB, CKMBINDEX, TROPONINI in the last 168 hours. BNP (last 3 results) No results for input(s): PROBNP in the last 8760 hours. HbA1C: No results for input(s): HGBA1C in the last 72 hours. CBG: No results for input(s): GLUCAP in the last 168 hours. Lipid Profile: No results for input(s): CHOL, HDL, LDLCALC, TRIG, CHOLHDL, LDLDIRECT in the last 72 hours. Thyroid Function Tests: No results for input(s): TSH, T4TOTAL, FREET4, T3FREE, THYROIDAB in the last 72 hours. Anemia Panel: No results for input(s): VITAMINB12, FOLATE, FERRITIN, TIBC, IRON, RETICCTPCT in the last 72 hours. Urine analysis:    Component Value Date/Time   COLORURINE YELLOW 03/01/2016 [place] 03/01/2016 2224   LABSPEC 1.015 03/01/2016 2224   PHURINE 7.5 03/01/2016 2224   GLUCOSEU NEGATIVE 03/01/2016 2224   HGBUR NEGATIVE 03/01/2016 2224   BILIRUBINUR NEGATIVE 03/01/2016 2224   KETONESUR NEGATIVE 03/01/2016 2224   PROTEINUR NEGATIVE 03/01/2016 2224   UROBILINOGEN 0.2 02/10/2015 1936   NITRITE NEGATIVE 03/01/2016 2224   LEUKOCYTESUR NEGATIVE 03/01/2016 2224   Sepsis Labs: @LABRCNTIP (procalcitonin:4,lacticidven:4)  ) Recent Results (from the past 240 hour(s))  Urine culture     Status: Abnormal   Collection Time: 03/01/16 10:24 PM  Result Value Ref Range Status   Specimen Description URINE, RANDOM  Final   Special Requests NONE  Final   Culture MULTIPLE SPECIES PRESENT, SUGGEST RECOLLECTION (A)  Final   Report Status 03/04/2016 FINAL  Final  Culture, blood (Routine X 2) w Reflex to ID Panel     Status: None (Preliminary result)   Collection Time: 03/02/16  9:15 AM  Result Value Ref Range Status   Specimen Description BLOOD RIGHT HAND  Final   Special Requests BOTTLES DRAWN AEROBIC AND ANAEROBIC 5CC  Final   Culture NO GROWTH 3 DAYS  Final    Report Status PENDING  Incomplete  Culture, blood (Routine X 2) w Reflex to ID Panel     Status: None (Preliminary result)   Collection Time: 03/02/16  9:18 AM  Result Value Ref Range Status   Specimen Description BLOOD LEFT ANTECUBITAL  Final   Special Requests BOTTLES DRAWN AEROBIC AND ANAEROBIC 5CC  Final   Culture NO GROWTH 3 DAYS  Final   Report Status PENDING  Incomplete  Acid Fast Smear (AFB)     Status: None   Collection Time: 03/04/16 11:21 AM  Result Value Ref Range Status   AFB Specimen Processing Concentration  Final   Acid Fast Smear Negative  Final    Comment: (NOTE) Performed At:  [facility] [place], [place] [id number] Lindon Romp MD [id number]    Source (AFB) L5 S1 IN SYRINGE   Final  Aerobic/Anaerobic Culture (surgical/deep wound)     Status: None (Preliminary result)   Collection Time: 03/04/16 11:57 AM  Result Value Ref Range Status   Specimen Description ABSCESS BACK  Final   Special Requests L5 S1 IN SYRINGE  Final   Gram Stain NO WBC SEEN NO ORGANISMS SEEN   Final   Culture NO GROWTH 2 DAYS  Final   Report Status PENDING  Incomplete         Radiology Studies: No results found.      Scheduled Meds: . cefTRIAXone (ROCEPHIN)  IV  2 g Intravenous Q24H  . citalopram  20 mg Oral QHS  . enoxaparin (LOVENOX) injection  30 mg Subcutaneous Q24H  . furosemide  20 mg Oral Daily  . hydrALAZINE  50 mg Oral TID  . isosorbide mononitrate  30 mg Oral Daily  . metoprolol succinate  25 mg Oral Daily  . polyethylene glycol  17 g Oral Daily  . potassium chloride  10 mEq Oral Daily  . rosuvastatin  10 mg Oral QHS  . sodium chloride flush  3 mL Intravenous Q12H  . traZODone  100 mg Oral QHS  . vancomycin  750 mg Intravenous Q24H  . zolpidem  5 mg Oral QHS   Continuous Infusions:   LOS: 5 days    Time spent: 87min    Domenic Polite, MD [facility] Pager [phone]  If 7PM-7AM, please contact  night-coverage [email or web address] Password [email or web address] 03/06/2016, 11:11 AM

## 2016-03-07 ENCOUNTER — Encounter: Payer: Medicare Other | Admitting: Physical Therapy

## 2016-03-07 DIAGNOSIS — Z95828 Presence of other vascular implants and grafts: Secondary | ICD-10-CM

## 2016-03-07 DIAGNOSIS — M4646 Discitis, unspecified, lumbar region: Secondary | ICD-10-CM

## 2016-03-07 DIAGNOSIS — Z882 Allergy status to sulfonamides status: Secondary | ICD-10-CM

## 2016-03-07 LAB — BASIC METABOLIC PANEL
Anion gap: 9 (ref 5–15)
BUN: 18 mg/dL (ref 6–20)
CO2: 27 mmol/L (ref 22–32)
Calcium: 9.2 mg/dL (ref 8.9–10.3)
Chloride: 103 mmol/L (ref 101–111)
Creatinine, Ser: 1.17 mg/dL — ABNORMAL HIGH (ref 0.44–1.00)
GFR calc Af Amer: 50 mL/min — ABNORMAL LOW (ref >60)
GFR, EST NON AFRICAN AMERICAN: 43 mL/min — AB (ref >60)
GLUCOSE: 103 mg/dL — AB (ref 65–99)
POTASSIUM: 3.6 mmol/L (ref 3.5–5.1)
Sodium: 139 mmol/L (ref 135–145)

## 2016-03-07 LAB — CULTURE, BLOOD (ROUTINE X 2)
CULTURE: NO GROWTH
Culture: NO GROWTH

## 2016-03-07 MED ORDER — CEFTRIAXONE SODIUM 2 G IJ SOLR
2.0000 g | INTRAMUSCULAR | 0 refills | Status: AC
Start: 2016-03-07 — End: 2016-04-21

## 2016-03-07 MED ORDER — VANCOMYCIN HCL IN DEXTROSE 750-5 MG/150ML-% IV SOLN: 750.0000 mg | INTRAVENOUS | 0 refills | Status: AC

## 2016-03-07 MED ORDER — HEPARIN SOD (PORK) LOCK FLUSH 100 UNIT/ML IV SOLN
250.0000 [IU] | INTRAVENOUS | Status: AC | PRN
  Administered 2016-03-07: 250 [IU]

## 2016-03-07 MED ORDER — OXYCODONE HCL 5 MG PO TABS: 5.0000 mg | ORAL_TABLET | Freq: Four times a day (QID) | ORAL | 0 refills | Status: DC | PRN

## 2016-03-07 NOTE — Discharge Summary (Signed)
[name] [id number] DOB: 11/05/1934 DOA: 03/01/2016  PCP: Terald Sleeper, PA-C  Admit date: 03/01/2016  Discharge date: 03/07/2016  Admitted From: Home   Disposition:  Home   Recommendations for Outpatient Follow-up:   Follow up with PCP in 1-2 weeks  PCP Please obtain BMP/CBC, 2 view CXR in 1week,  (see Discharge instructions)   PCP Please follow up on the following pending results: final cultures pending   Home Health: HHRN  Equipment/Devices: None  Consultations: ID, N.Surg Discharge Condition: Stable   CODE STATUS: Full   Diet Recommendation: Heart Healthy    Chief Complaint  Patient presents with  . Back Pain     Brief history of present illness from the day of admission and additional interim summary    [name] a 81 y.o.femalewith progressive low back pain, worse over the last three to four days. Came to ED at [facility] MRI with L5-S1 discitis. Transferred to [facility]. ID consulted, s/p CT guided aspiration 12/4, final cultures pending   Hospital issues addressed     L4-L5 discitis. Seen by ID physician D> Johnnye Sima neurosurgeon Dr. Cyndy Freeze, recommendation was medical management, she had CT-guided disc aspiration on 12/4 which so far is negative, discussed her [place] with ID physician Dr. Johnnye Sima Who recommends to 5 weeks of antibiotics which are IV vancomycin and Rocephin starting from 03/05/2016 to be managed by home health RN and pharmacy.  Patient is symptom-free,has received PICC line and will get home health RN for IV antibiotics with outpatient follow-up with both neurosurgery and ID post discharge. Request PCP to monitor final culture results and monitor patient Closely. Currently No focal deficits.  CAD status post LAD stent in 2013 Stable no acute issues she is currently metoprolol,  Crestor and Imdur for secondary prevention. Not on aspirin due to high bleeding risk per her primary cardiologist.  History of paroxysmal atrial fibrillation. [place] vasc 2 score of at least 3. Currently in sinus on blocker. She has high fall and bleeding risk per her primary cardiologist and no anticoagulation has been recommended.  Essential hypertension. Continue home regimen Except ARB which was held due to borderline blood pressures, blood pressure still stable with outpatient being on the ARB, will hold, PCP to monitor.  Chronic diastolic CHF.EF 70%. Compensated   Discharge diagnosis     Principal Problem:   Discitis of lumbar region L5- S1 Active Problems:   HTN (hypertension)   Coronary artery disease   CKD (chronic kidney disease) stage 3, GFR 30-59 ml/min   PAF (paroxysmal atrial fibrillation) (HCC)   Epidural abscess L5- S1   Back pain, acute   Chronic diastolic CHF (congestive heart failure) ([place])    Discharge instructions    Discharge Instructions    Diet - low sodium heart healthy    Complete by:  As directed    Discharge instructions    Complete by:  As directed    Follow with Primary MD Terald Sleeper, PA-C in 7 days   Get CBC, CMP,  2 view Chest X Maddelynn checked  by Primary MD or SNF MD in 5-7 days ( we routinely change or add medications that can affect your baseline labs and fluid status, therefore we recommend that you get the mentioned basic workup next visit with your PCP, your PCP may decide not to get them or add new tests based on their clinical decision)   Activity: As tolerated with Full fall precautions use walker/cane & assistance as needed   Disposition Home     Diet:  Heart Healthy    For Heart failure patients - Check your Weight same time everyday, if you gain over 2 pounds, or you develop in leg swelling, experience more shortness of breath or chest pain, call your Primary MD immediately. Follow Cardiac Low Salt Diet and 1.5 lit/day fluid  restriction.   On your next visit with your primary care physician please Get Medicines reviewed and adjusted.   Please request your Prim.MD to go over all Hospital Tests and Procedure/Radiological results at the follow up, please get all Hospital records sent to your Prim MD by signing hospital release before you go home.   If you experience worsening of your admission symptoms, develop shortness of breath, life threatening emergency, suicidal or homicidal thoughts you must seek medical attention immediately by calling 911 or calling your MD immediately  if symptoms less severe.  You Must read complete instructions/literature along with all the possible adverse reactions/side effects for all the Medicines you take and that have been prescribed to you. Take any new Medicines after you have completely understood and accpet all the possible adverse reactions/side effects.   Do not drive, operate heavy machinery, perform activities at heights, swimming or participation in water activities or provide baby sitting services if your were admitted for syncope or siezures until you have seen by Primary MD or a Neurologist and advised to do so again.  Do not drive when taking Pain medications.    Do not take more than prescribed Pain, Sleep and Anxiety Medications  Special Instructions: If you have smoked or chewed Tobacco  in the last 2 yrs please stop smoking, stop any regular Alcohol  and or any Recreational drug use.  Wear Seat belts while driving.   Please note  You were cared for by a hospitalist during your hospital stay. If you have any questions about your discharge medications or the care you received while you were in the hospital after you are discharged, you can call the unit and asked to speak with the hospitalist on call if the hospitalist that took care of you is not available. Once you are discharged, your primary care physician will handle any further medical issues. Please note  that NO REFILLS for any discharge medications will be authorized once you are discharged, as it is imperative that you return to your primary care physician (or establish a relationship with a primary care physician if you do not have one) for your aftercare needs so that they can reassess your need for medications and monitor your lab values.   Increase activity slowly    Complete by:  As directed       Discharge Medications     Medication List    STOP taking these medications   acetaminophen-codeine 300-30 MG tablet Commonly known as:  TYLENOL #3   amoxicillin 500 MG capsule Commonly known as:  AMOXIL   losartan 100 MG tablet Commonly known as:  COZAAR   predniSONE 10 MG tablet Commonly  known as:  DELTASONE     TAKE these medications   albuterol 108 (90 Base) MCG/ACT inhaler Commonly known as:  PROVENTIL HFA;VENTOLIN HFA Inhale 2 puffs into the lungs every 6 (six) hours as needed for wheezing or shortness of breath.   ALPRAZolam 0.25 MG tablet Commonly known as:  XANAX Take 1 tablet (0.25 mg total) by mouth 2 (two) times daily as needed for anxiety.   cefTRIAXone 2 g in dextrose 5 % 50 mL Inject 2 g into the vein daily. Total 6 week supply starting from 03/05/2016. Home health RN and pharmacy to monitor and dispense as needed.   citalopram 40 MG tablet Commonly known as:  CELEXA Take 20 mg by mouth at bedtime.   esomeprazole 40 MG capsule Commonly known as:  NEXIUM Take 40 mg by mouth daily before breakfast.   ferrous sulfate 325 (65 FE) MG EC tablet Take 325 mg by mouth daily.   furosemide 20 MG tablet Commonly known as:  LASIX Take 1 tablet (20 mg total) by mouth daily.   hydrALAZINE 50 MG tablet Commonly known as:  APRESOLINE Take 1 tablet (50 mg total) by mouth 3 (three) times daily.   isosorbide mononitrate 30 MG 24 hr tablet Commonly known as:  IMDUR Take 30 mg by mouth daily.   loratadine 10 MG tablet Commonly known as:  CLARITIN Take 10 mg by  mouth daily.   metoprolol succinate 25 MG 24 hr tablet Commonly known as:  TOPROL-XL Take 1 tablet (25 mg total) by mouth daily.   nitroGLYCERIN 0.4 MG SL tablet Commonly known as:  NITROSTAT Place 0.4 mg under the tongue every 5 (five) minutes as needed for chest pain. Reported on 04/26/2015   oxyCODONE 5 MG immediate release tablet Commonly known as:  Oxy IR/ROXICODONE Take 1 tablet (5 mg total) by mouth every 6 (six) hours as needed for moderate pain.   polyethylene glycol powder powder Commonly known as:  GLYCOLAX/MIRALAX MIX 17 GRAMS IN AT LEAST 8OZ OF WATER/JUICE AND DRINK ONCE OR TWICE DAILY   potassium chloride 10 MEQ tablet Commonly known as:  K-DUR Take 1 tablet (10 mEq total) by mouth daily.   PRESERVISION AREDS Tabs Take 1 tablet by mouth 2 (two) times daily.   rosuvastatin 10 MG tablet Commonly known as:  CRESTOR Take 10 mg by mouth at bedtime.   traZODone 100 MG tablet Commonly known as:  DESYREL Take 100 mg by mouth at bedtime.   Vancomycin 750-5 MG/150ML-% Soln Commonly known as:  VANCOCIN Inject 150 mLs (750 mg total) into the vein daily. Total 6 week supply starting from 03/05/2016. Home health RN and pharmacy to monitor and dispense as needed.   Vitamin D (Ergocalciferol) 50000 units Caps capsule Commonly known as:  DRISDOL Take 50,000 Units by mouth every Tuesday.   zolpidem 10 MG tablet Commonly known as:  AMBIEN Take 10 mg by mouth at bedtime.       Follow-up Information    Terald Sleeper, PA-C. Schedule an appointment as soon as possible for a visit in 1 week(s).   Specialties:  Physician Assistant, Family Medicine Contact information: [place] [address] [phone]        Kevan Ny Ditty, MD. Schedule an appointment as soon as possible for a visit in 1 week(s).   Specialty:  Neurosurgery Contact information: [address] [place] [address] [phone]        Bobby Rumpf, MD. Schedule an  appointment as soon as possible for a visit in 1 week(s).   Specialty:  Infectious Diseases Contact information: [place] [address] [phone]           Major procedures and Radiology Reports - PLEASE review detailed and final reports thoroughly  -     Procedure:   12/4: IR CT guided disc aspiration PICC 12/5   Mr Lumbar Spine Wo Contrast  Result Date: 03/01/2016 CLINICAL DATA:  81 y/o  F; lower back pain and unable to walk. EXAM: MRI LUMBAR SPINE WITHOUT CONTRAST TECHNIQUE: Multiplanar, multisequence MR imaging of the lumbar spine was performed. No intravenous contrast was administered. COMPARISON:  None. FINDINGS: Segmentation:  Standard. Alignment:  Normal lumbar lordosis.  Grade 1 L5-S1 anterolisthesis. Vertebrae: There is diffuse edema throughout the L5 and S1 vertebral bodies and increased signal within the intervertebral disc likely representing discitis osteomyelitis. The increased disc signal extends superiorly into the right anterior and right lateral epidural space at the L5 level (series 7: Image 33 and 6:10) as well as the prevertebral space to the left and anterior of the S1 vertebral body (6:13 and 7:40). Additionally, there is prevertebral edema extending from the L4 level overlying the sacrum in the presacral fat. Increased signal at the L4-5 intervertebral disc level may represent developing infection as well. Conus medullaris: Extends to the lower L1 level and appears normal. Paraspinal and other soft tissues: As above. Right kidney interpolar partially visualize cyst. Disc levels: L1-2: No significant disc displacement, foraminal narrowing, or canal stenosis. L2-3: Minimal disc bulge. No significant foraminal narrowing or canal stenosis. L3-4: Small disc bulge with mild facet and ligamentum flavum hypertrophy. No significant foraminal narrowing. Mild narrowing of lateral recesses and mild canal stenosis. L4-5: Moderate disc bulge with moderate  facet and ligamentum flavum hypertrophy. Mild bilateral foraminal narrowing. Severe canal stenosis. L5-S1: Anterolisthesis with uncovered disc, extensive facet and ligamentum flavum hypertrophy, and fluid collection in the right anterior and lateral epidural space. Moderate to severe canal stenosis with crowding of cauda equina and severe bilateral foraminal narrowing. IMPRESSION: 1. L5-S1 discitis osteomyelitis. Right anterior and lateral epidural collection at the L5 level and presacral collection to the left and anterior of S1 vertebral body. Paraspinal inflammatory changes of soft tissues. The full extent of disease can be better characterized with a contrast examination. 2. The L5-S1 epidural collection and degenerative disease results in moderate to severe canal stenosis with crowding of the cauda equina at the L5-S1 level. 3. Mild increased signal within the L4-5 intervertebral disc may represent early infection. 4. Additional degenerative changes of the spine resulting in severe L4-5 canal stenosis and severe foraminal narrowing at the L5-S1 level bilaterally. These results were called by telephone at the time of interpretation on 03/01/2016 at 7:31 pm to Dr. Francine Graven , who verbally acknowledged these results. Electronically Signed   By: Kristine Garbe M.D.   On: 03/01/2016 19:34   Ir Lumbar Disc Aspiration W/img Guide  Result Date: 03/05/2016 INDICATION: Severe low  pain.  Lumbar diskitis. EXAM: IR DISC ASPIRATION WITH IMAGE GUIDE MEDICATIONS: The patient is currently admitted to the hospital and receiving intravenous antibiotics. The antibiotics were administered within an appropriate time frame prior to the initiation of the procedure. ANESTHESIA/SEDATION: Fentanyl 2 mcg IV; Versed 50 mg IV Moderate Sedation Time:  20 minutes. The patient was continuously monitored during the procedure by the interventional radiology nurse under my direct supervision. COMPLICATIONS: None immediate.  PROCEDURE: Informed written consent  was obtained from the patient after a thorough discussion of the procedural risks, benefits and alternatives. All questions were addressed. Maximal Sterile Barrier Technique was utilized including caps, mask, sterile gowns, sterile gloves, sterile drape, hand hygiene and skin antiseptic. A timeout was performed prior to the initiation of the procedure. The skin overlying the lumbosacral region was then prepped and cleaned in the usual sterile fashion. Thereafter using biplane intermittent fluoroscopy, the skin entry site and the underlying paraspinal muscle was then infiltrated with 0.25% bupivacaine followed by the advancement of a 21 gauge Francine biopsy needle without difficulty. Access was obtained into the L5-S1 disk space without any difficulty with the needle extending into the anterior one-third. A near crossing the midline was noted on the AP projection. Using a 20 mL syringe, thick bloody fluid aspirate was obtained and sent for microbiologic analysis. Two passes were made obtaining approximately 2.5 mL of the aspirate. The needle was then removed. Hemostasis was achieved at the skin entry site. The patient tolerated the procedure well. There were no acute complications. IMPRESSION: Status post fluoroscopic guided needle placement at L5-S1 for disk aspiration for lumbar discitis as described above without event. Electronically Signed   By: Luanne Bras M.D.   On: 03/04/2016 11:51    Micro Results     Recent Results (from the past 240 hour(s))  Urine culture     Status: Abnormal   Collection Time: 03/01/16 10:24 PM  Result Value Ref Range Status   Specimen Description URINE, RANDOM  Final   Special Requests NONE  Final   Culture MULTIPLE SPECIES PRESENT, SUGGEST RECOLLECTION (A)  Final   Report Status 03/04/2016 FINAL  Final  Culture, blood (Routine X 2) w Reflex to ID Panel     Status: None   Collection Time: 03/02/16  9:15 AM  Result Value Ref  Range Status   Specimen Description BLOOD RIGHT HAND  Final   Special Requests BOTTLES DRAWN AEROBIC AND ANAEROBIC 5CC  Final   Culture NO GROWTH 5 DAYS  Final   Report Status 03/07/2016 FINAL  Final  Culture, blood (Routine X 2) w Reflex to ID Panel     Status: None   Collection Time: 03/02/16  9:18 AM  Result Value Ref Range Status   Specimen Description BLOOD LEFT ANTECUBITAL  Final   Special Requests BOTTLES DRAWN AEROBIC AND ANAEROBIC 5CC  Final   Culture NO GROWTH 5 DAYS  Final   Report Status 03/07/2016 FINAL  Final  Acid Fast Smear (AFB)     Status: None   Collection Time: 03/04/16 11:21 AM  Result Value Ref Range Status   AFB Specimen Processing Concentration  Final   Acid Fast Smear Negative  Final    Comment: (NOTE) Performed At: [facility] [place], [place] [id number] Lindon Romp MD [id number]    Source (AFB) L5 S1 IN SYRINGE   Final  Aerobic/Anaerobic Culture (surgical/deep wound)     Status: None (Preliminary result)   Collection Time: 03/04/16 11:57 AM  Result Value Ref Range Status   Specimen Description ABSCESS BACK  Final   Special Requests L5 S1 IN SYRINGE  Final   Gram Stain NO WBC SEEN NO ORGANISMS SEEN   Final   Culture NO GROWTH 2 DAYS  Final   Report Status PENDING  Incomplete    Today   Subjective    [name] today has no headache,no chest abdominal pain,no new weakness tingling or numbness, feels much better wants  to go home today.     Objective   Blood pressure (!) 137/48, pulse (!) 58, temperature 98.2 F (36.8 C), temperature source Oral, resp. rate 16, height 5\' 2"  (1.575 m), weight 59.5 kg (131 lb 1.6 oz), SpO2 97 %.  No intake or output data in the 24 hours ending 03/07/16 1149  Exam Awake Alert, Oriented x 3, No new F.N deficits, Normal affect [place].AT,PERRAL Supple Neck,No JVD, No cervical lymphadenopathy appriciated.  Symmetrical Chest wall movement, Good air movement bilaterally, CTAB RRR,No  Gallops,Rubs or new Murmurs, No Parasternal Heave +ve B.Sounds, Abd Soft, Non tender, No organomegaly appriciated, No rebound -guarding or rigidity. No Cyanosis, Clubbing or edema, No new Rash or bruise   Data Review   CBC w Diff:  Lab Results  Component Value Date   WBC 4.0 03/05/2016   HGB 9.1 (L) 03/05/2016   HCT 28.9 (L) 03/05/2016   HCT 32.4 (L) 12/18/2015   PLT 204 03/05/2016   PLT 273 12/18/2015   LYMPHOPCT 24 03/01/2016   BANDSPCT 0 08/28/2015   MONOPCT 8 03/01/2016   EOSPCT 1 03/01/2016   BASOPCT 0 03/01/2016    CMP:  Lab Results  Component Value Date   NA 139 03/07/2016   NA 142 12/18/2015   K 3.6 03/07/2016   CL 103 03/07/2016   CO2 27 03/07/2016   BUN 18 03/07/2016   BUN 16 12/18/2015   CREATININE 1.17 (H) 03/07/2016   PROT 6.3 (L) 03/01/2016   PROT 6.1 12/18/2015   ALBUMIN 3.4 (L) 03/01/2016   ALBUMIN 3.2 (L) 12/18/2015   BILITOT 0.5 03/01/2016   BILITOT <0.2 12/18/2015   ALKPHOS 59 03/01/2016   AST 14 (L) 03/01/2016   ALT 6 (L) 03/01/2016  .   Total Time in preparing paper work, data evaluation and todays exam - 35 minutes  Thurnell Lose M.D on 03/07/2016 at 11:49 AM  [facility]   Office  [phone]

## 2016-03-07 NOTE — Discharge Instructions (Signed)
Follow with Primary MD Terald Sleeper, PA-C in 7 days   Get CBC, CMP, 2 view Chest X Rielynn checked  by Primary MD or SNF MD in 5-7 days ( we routinely change or add medications that can affect your baseline labs and fluid status, therefore we recommend that you get the mentioned basic workup next visit with your PCP, your PCP may decide not to get them or add new tests based on their clinical decision)   Activity: As tolerated with Full fall precautions use walker/cane & assistance as needed   Disposition Home     Diet:    Heart Healthy  For Heart failure patients - Check your Weight same time everyday, if you gain over 2 pounds, or you develop in leg swelling, experience more shortness of breath or chest pain, call your Primary MD immediately. Follow Cardiac Low Salt Diet and 1.5 lit/day fluid restriction.   On your next visit with your primary care physician please Get Medicines reviewed and adjusted.   Please request your Prim.MD to go over all Hospital Tests and Procedure/Radiological results at the follow up, please get all Hospital records sent to your Prim MD by signing hospital release before you go home.   If you experience worsening of your admission symptoms, develop shortness of breath, life threatening emergency, suicidal or homicidal thoughts you must seek medical attention immediately by calling 911 or calling your MD immediately  if symptoms less severe.  You Must read complete instructions/literature along with all the possible adverse reactions/side effects for all the Medicines you take and that have been prescribed to you. Take any new Medicines after you have completely understood and accpet all the possible adverse reactions/side effects.   Do not drive, operate heavy machinery, perform activities at heights, swimming or participation in water activities or provide baby sitting services if your were admitted for syncope or siezures until you have seen by Primary MD or a  Neurologist and advised to do so again.  Do not drive when taking Pain medications.    Do not take more than prescribed Pain, Sleep and Anxiety Medications  Special Instructions: If you have smoked or chewed Tobacco  in the last 2 yrs please stop smoking, stop any regular Alcohol  and or any Recreational drug use.  Wear Seat belts while driving.   Please note  You were cared for by a hospitalist during your hospital stay. If you have any questions about your discharge medications or the care you received while you were in the hospital after you are discharged, you can call the unit and asked to speak with the hospitalist on call if the hospitalist that took care of you is not available. Once you are discharged, your primary care physician will handle any further medical issues. Please note that NO REFILLS for any discharge medications will be authorized once you are discharged, as it is imperative that you return to your primary care physician (or establish a relationship with a primary care physician if you do not have one) for your aftercare needs so that they can reassess your need for medications and monitor your lab values.

## 2016-03-07 NOTE — Progress Notes (Signed)
INFECTIOUS DISEASE PROGRESS NOTE  ID: [name] is a 81 y.o. female with  Principal Problem:   Discitis of lumbar region L5- S1 Active Problems:   HTN (hypertension)   Coronary artery disease   CKD (chronic kidney disease) stage 3, GFR 30-59 ml/min   PAF (paroxysmal atrial fibrillation) (HCC)   Epidural abscess L5- S1   Back pain, acute   Chronic diastolic CHF (congestive heart failure) (HCC)  Subjective: Without complaints  Abtx:  Anti-infectives    Start     Dose/Rate Route Frequency Ordered Stop   03/05/16 2000  vancomycin (VANCOCIN) IVPB 750 mg/150 ml premix     750 mg 150 mL/hr over 60 Minutes Intravenous Every 24 hours 03/04/16 1900     03/04/16 2000  cefTRIAXone (ROCEPHIN) 2 g in dextrose 5 % 50 mL IVPB     2 g 100 mL/hr over 30 Minutes Intravenous Every 24 hours 03/04/16 1842     03/04/16 2000  vancomycin (VANCOCIN) IVPB 1000 mg/200 mL premix     1,000 mg 200 mL/hr over 60 Minutes Intravenous  Once 03/04/16 1900 03/04/16 2232   03/03/16 0100  vancomycin (VANCOCIN) IVPB 750 mg/150 ml premix  Status:  Discontinued     750 mg 150 mL/hr over 60 Minutes Intravenous Every 24 hours 03/02/16 0819 03/02/16 1718   03/02/16 0600  piperacillin-tazobactam (ZOSYN) IVPB 3.375 g  Status:  Discontinued     3.375 g 12.5 mL/hr over 240 Minutes Intravenous Every 8 hours 03/01/16 2201 03/02/16 1718   03/01/16 2215  piperacillin-tazobactam (ZOSYN) IVPB 3.375 g     3.375 g 100 mL/hr over 30 Minutes Intravenous STAT 03/01/16 2201 03/01/16 2245   03/01/16 2215  vancomycin (VANCOCIN) IVPB 1000 mg/200 mL premix     1,000 mg 200 mL/hr over 60 Minutes Intravenous  Once 03/01/16 2203 03/02/16 0218      Medications:  Scheduled: . cefTRIAXone (ROCEPHIN)  IV  2 g Intravenous Q24H  . citalopram  20 mg Oral QHS  . enoxaparin (LOVENOX) injection  30 mg Subcutaneous Q24H  . furosemide  20 mg Oral Daily  . hydrALAZINE  50 mg Oral TID  . isosorbide mononitrate  30 mg Oral Daily  .  metoprolol succinate  25 mg Oral Daily  . polyethylene glycol  17 g Oral Daily  . potassium chloride  10 mEq Oral Daily  . rosuvastatin  10 mg Oral QHS  . sodium chloride flush  3 mL Intravenous Q12H  . traZODone  100 mg Oral QHS  . vancomycin  750 mg Intravenous Q24H  . zolpidem  5 mg Oral QHS    Objective: Vital signs in last 24 hours: Temp:  [98 F (36.7 C)-99.3 F (37.4 C)] 98.2 F (36.8 C) (12/07 0900) Pulse Rate:  [52-62] 58 (12/07 0900) Resp:  [16-20] 16 (12/07 0900) BP: (137-169)/(41-74) 137/48 (12/07 0900) SpO2:  [95 %-100 %] 97 % (12/07 0900)   General appearance: alert, cooperative and no distress Resp: clear to auscultation bilaterally Cardio: regular rate and rhythm GI: normal findings: bowel sounds normal and soft, non-tender Extremities: RUE [facility]  Lab Results  Recent Labs  03/05/16 0335 03/07/16 0613  WBC 4.0  --   HGB 9.1*  --   HCT 28.9*  --   NA  --  139  K  --  3.6  CL  --  103  CO2  --  27  BUN  --  18  CREATININE  --  1.17*   Liver  Panel No results for input(s): PROT, ALBUMIN, AST, ALT, ALKPHOS, BILITOT, BILIDIR, IBILI in the last 72 hours. Sedimentation Rate No results for input(s): ESRSEDRATE in the last 72 hours. C-Reactive Protein No results for input(s): CRP in the last 72 hours.  Microbiology: Recent Results (from the past 240 hour(s))  Urine culture     Status: Abnormal   Collection Time: 03/01/16 10:24 PM  Result Value Ref Range Status   Specimen Description URINE, RANDOM  Final   Special Requests NONE  Final   Culture MULTIPLE SPECIES PRESENT, SUGGEST RECOLLECTION (A)  Final   Report Status 03/04/2016 FINAL  Final  Culture, blood (Routine X 2) w Reflex to ID Panel     Status: None   Collection Time: 03/02/16  9:15 AM  Result Value Ref Range Status   Specimen Description BLOOD RIGHT HAND  Final   Special Requests BOTTLES DRAWN AEROBIC AND ANAEROBIC 5CC  Final   Culture NO GROWTH 5 DAYS  Final   Report Status 03/07/2016  FINAL  Final  Culture, blood (Routine X 2) w Reflex to ID Panel     Status: None   Collection Time: 03/02/16  9:18 AM  Result Value Ref Range Status   Specimen Description BLOOD LEFT ANTECUBITAL  Final   Special Requests BOTTLES DRAWN AEROBIC AND ANAEROBIC 5CC  Final   Culture NO GROWTH 5 DAYS  Final   Report Status 03/07/2016 FINAL  Final  Acid Fast Smear (AFB)     Status: None   Collection Time: 03/04/16 11:21 AM  Result Value Ref Range Status   AFB Specimen Processing Concentration  Final   Acid Fast Smear Negative  Final    Comment: (NOTE) Performed At: [facility] [place], [place] [id number] Lindon Romp MD MV:[id number]    Source (AFB) L5 S1 IN SYRINGE   Final  Aerobic/Anaerobic Culture (surgical/deep wound)     Status: None (Preliminary result)   Collection Time: 03/04/16 11:57 AM  Result Value Ref Range Status   Specimen Description ABSCESS BACK  Final   Special Requests L5 S1 IN SYRINGE  Final   Gram Stain NO WBC SEEN NO ORGANISMS SEEN   Final   Culture NO GROWTH 2 DAYS  Final   Report Status PENDING  Incomplete    Studies/Results: No results found.   Assessment/Plan: Diskitis Epidural Abscess  Total days of antibiotics: 3 vanco/ceftriaxone  Diagnosis: Diskitis  Culture Result: negative  Allergies  Allergen Reactions  . Sulfamethoxazole Rash    Discharge antibiotics: vancomycin,  Ceftriaxone 2g IVPB qday Per pharmacy protocol vancomycin Aim for Vancomycin trough 15-20 (unless otherwise indicated) Duration: 42 days End Date: April 15, 2016  [facility] Care Per Protocol:  Labs weekly while on IV antibiotics: _x_ CBC with differential __ BMP _x_ CMP _x_ CRP _x_ ESR _x_ Vancomycin trough  _x_ Please pull PIC at completion of IV antibiotics _x_ Please leave PIC in place until doctor has seen patient or been notified  Fax weekly labs to [phone]  Clinic Follow Up Appt: 6 weeks, [place]rinna [place]          Bobby Rumpf Infectious Diseases (pager) [phone] [email or web address] 03/07/2016, 11:04 AM  LOS: 6 days

## 2016-03-07 NOTE — Care Management Note (Signed)
Char Management Note  Patient Details  Name: [name] MRN: [id number] Date of Birth: Dec 07, 1934  Subjective/Objective:                    Action/Plan: Pt discharging home today with orders for [facility] RN. Pt set up with [facility] for IV antibiotic therapy per her choice. Pam with [facility] IV therapy aware of discharge. Pt has transportation home.   Expected Discharge Date:  03/03/16               Expected Discharge Plan:  [place]  In-House Referral:     Discharge planning Services  CM Consult  Post Acute Care Choice:  Home Health Choice offered to:  Patient  DME Arranged:    DME Agency:     HH Arranged:  RN, IV Antibiotics HH Agency:  [place]  Status of Service:  Completed, signed off  If discussed at [place] of Stay Meetings, dates discussed:    Additional Comments:  Pollie Friar, RN 03/07/2016, 2:51 PM

## 2016-03-09 LAB — AEROBIC/ANAEROBIC CULTURE W GRAM STAIN (SURGICAL/DEEP WOUND)
Culture: NO GROWTH
Gram Stain: NONE SEEN

## 2016-03-09 LAB — AEROBIC/ANAEROBIC CULTURE (SURGICAL/DEEP WOUND)

## 2016-03-11 ENCOUNTER — Encounter: Payer: Self-pay | Admitting: Infectious Diseases

## 2016-03-11 ENCOUNTER — Ambulatory Visit (INDEPENDENT_AMBULATORY_CARE_PROVIDER_SITE_OTHER): Payer: Medicare Other | Admitting: Physician Assistant

## 2016-03-11 ENCOUNTER — Encounter: Payer: Self-pay | Admitting: Physician Assistant

## 2016-03-11 VITALS — BP 183/74 | HR 48 | Temp 97.0°F | Ht 62.0 in | Wt 131.0 lb

## 2016-03-11 DIAGNOSIS — M4646 Discitis, unspecified, lumbar region: Secondary | ICD-10-CM | POA: Diagnosis not present

## 2016-03-11 DIAGNOSIS — I10 Essential (primary) hypertension: Secondary | ICD-10-CM | POA: Diagnosis not present

## 2016-03-11 MED ORDER — FUROSEMIDE 20 MG PO TABS: 20.0000 mg | ORAL_TABLET | Freq: Every day | ORAL | 11 refills | Status: DC

## 2016-03-11 MED ORDER — POTASSIUM CHLORIDE ER 10 MEQ PO TBCR: 10.0000 meq | EXTENDED_RELEASE_TABLET | Freq: Every day | ORAL | 11 refills | Status: DC

## 2016-03-11 NOTE — Patient Instructions (Signed)
Hypertension Hypertension, commonly called high blood pressure, is when the force of blood pumping through your arteries is too strong. Your arteries are the blood vessels that carry blood from your heart throughout your body. A blood pressure reading consists of a higher number over a lower number, such as 110/72. The higher number (systolic) is the pressure inside your arteries when your heart pumps. The lower number (diastolic) is the pressure inside your arteries when your heart relaxes. Ideally you want your blood pressure below 120/80. Hypertension forces your heart to work harder to pump blood. Your arteries may become narrow or stiff. Having untreated or uncontrolled hypertension can cause heart attack, stroke, kidney disease, and other problems. What increases the risk? Some risk factors for high blood pressure are controllable. Others are not. Risk factors you cannot control include:  Race. You may be at higher risk if you are African American.  Age. Risk increases with age.  Gender. Men are at higher risk than women before age 45 years. After age 65, women are at higher risk than men. Risk factors you can control include:  Not getting enough exercise or physical activity.  Being overweight.  Getting too much fat, sugar, calories, or salt in your diet.  Drinking too much alcohol. What are the signs or symptoms? Hypertension does not usually cause signs or symptoms. Extremely high blood pressure (hypertensive crisis) may cause headache, anxiety, shortness of breath, and nosebleed. How is this diagnosed? To check if you have hypertension, your health care provider will measure your blood pressure while you are seated, with your arm held at the level of your heart. It should be measured at least twice using the same arm. Certain conditions can cause a difference in blood pressure between your right and left arms. A blood pressure reading that is higher than normal on one occasion does  not mean that you need treatment. If it is not clear whether you have high blood pressure, you may be asked to return on a different day to have your blood pressure checked again. Or, you may be asked to monitor your blood pressure at home for 1 or more weeks. How is this treated? Treating high blood pressure includes making lifestyle changes and possibly taking medicine. Living a healthy lifestyle can help lower high blood pressure. You may need to change some of your habits. Lifestyle changes may include:  Following the DASH diet. This diet is high in fruits, vegetables, and whole grains. It is low in salt, red meat, and added sugars.  Keep your sodium intake below 2,300 mg per day.  Getting at least 30-45 minutes of aerobic exercise at least 4 times per week.  Losing weight if necessary.  Not smoking.  Limiting alcoholic beverages.  Learning ways to reduce stress. Your health care provider may prescribe medicine if lifestyle changes are not enough to get your blood pressure under control, and if one of the following is true:  You are 18-59 years of age and your systolic blood pressure is above 140.  You are 60 years of age or older, and your systolic blood pressure is above 150.  Your diastolic blood pressure is above 90.  You have diabetes, and your systolic blood pressure is over 140 or your diastolic blood pressure is over 90.  You have kidney disease and your blood pressure is above 140/90.  You have heart disease and your blood pressure is above 140/90. Your personal target blood pressure may vary depending on your medical   conditions, your age, and other factors. Follow these instructions at home:  Have your blood pressure rechecked as directed by your health care provider.  Take medicines only as directed by your health care provider. Follow the directions carefully. Blood pressure medicines must be taken as prescribed. The medicine does not work as well when you skip  doses. Skipping doses also puts you at risk for problems.  Do not smoke.  Monitor your blood pressure at home as directed by your health care provider. Contact a health care provider if:  You think you are having a reaction to medicines taken.  You have recurrent headaches or feel dizzy.  You have swelling in your ankles.  You have trouble with your vision. Get help right away if:  You develop a severe headache or confusion.  You have unusual weakness, numbness, or feel faint.  You have severe chest or abdominal pain.  You vomit repeatedly.  You have trouble breathing. This information is not intended to replace advice given to you by your health care provider. Make sure you discuss any questions you have with your health care provider. Document Released: 03/18/2005 Document Revised: 08/24/2015 Document Reviewed: 01/08/2013 Elsevier Interactive Patient Education  2017 Elsevier Inc.  

## 2016-03-11 NOTE — Progress Notes (Signed)
BP (!) 183/74   Pulse (!) 48   Temp 97 F (36.1 C) (Oral)   Ht 5\' 2"  (1.575 m)   Wt 131 lb (59.4 kg)   BMI 23.96 kg/m    Subjective:    Patient ID: [name], female    DOB: [birth year], 81 y.o.   MRN: [id number]  HPI: [name] is a 81 y.o. female presenting on 03/11/2016 for Sinus Problem (4 wk rck) and Hospitalization Follow-up ([facility] admit)  Patient was admitted for discitis and severe lumbar back pain. Scans were positive for L5-S1 discitis. Infectious disease has helped with her care. She is still under home health care and getting daily infusions by IV. She will continue this for a few weeks. She was concerned because her blood pressure has been up slightly. She said that she was told to stop her losartan but she did not. Her blood pressure is slightly elevated today we will have her continue the medications and plan to recheck in a couple months. She will call us if blood pressure readings are elevated with her husband nurse.  Past Medical History:  Diagnosis Date  . Anemia   . Anxiety state, unspecified   . Atrial fibrillation with RVR ([place])   . [name]'s esophagus   . Bilateral sciatica   . Cellulitis of extremity   . Chronic back pain   . Congestive heart failure, unspecified    diastolic heart failure  . Coronary artery disease    a. Moderate to severe coronary disease involving the left anterior descending artery and right coronary artery.  Sequential stenosis in the LAD is significant.  Right coronary artery is moderate to severely likely nonischemic. Questionable small LVOT obstruction.  No Brockenbrough maneuver was performed.  Catheterization January 2013; b. 05/2014 MV no isch/infarct, EF 60%.  . Depressive disorder, not elsewhere classified   . Diverticulitis   . Erosive esophagitis 08/04/2015  . Esophageal reflux   . Gastric volvulus 08/04/2015  . Gastritis   . H/O hiatal hernia   . Hypertension   . IBS (irritable bowel syndrome)   . Infection of  esophagostomy ([place])   . Macular degeneration   . Melanoma ([place])    removal on back   . Pelvic abscess in female   . Perforated bowel ([place])   . Perforated sigmoid colon ([place])   . PONV (postoperative nausea and vomiting)   . Pulmonary hypertension    PA systolic pressure [id number] mmHg by echocardiogram, PA pressure 33/10 by cardiac catheterization PA saturation 62% thermodilution cardiac index 2.0 thick cardiac index 2.4  . Pure hypercholesterolemia   . Tricuspid regurgitation    moderate tricuspid regurgitation by echocardiogram, prior use of anorexic agents   Relevant past medical, surgical, family and social history reviewed and updated as indicated. Interim medical history since our last visit reviewed. Allergies and medications reviewed and updated. DATA REVIEWED: CHART IN EPIC  Social History   Social History  . Marital status: Divorced    Spouse name: N/A  . Number of children: 3  . Years of education: N/A   Occupational History  . Retired    Social History Main Topics  . Smoking status: Former Smoker    Packs/day: 1.00    Years: 20.00    Types: Cigarettes    Start date: 10/12/1958    Quit date: 04/26/1988  . Smokeless tobacco: Never Used  . Alcohol use No  . Drug use: No  . Sexual activity: No  Other Topics Concern  . Not on file   Social History Narrative   Divorced.  Lives alone.  Ambulates with a walker.     Past Surgical History:  Procedure Laterality Date  . CARDIAC CATHETERIZATION Left 03/05/2015   Procedure: CENTRAL LINE INSERTION;  Surgeon: Aviva Signs, MD;  Location: [facility];  Service: General;  Laterality: Left;  . CATARACT EXTRACTION W/ INTRAOCULAR LENS  IMPLANT, BILATERAL  ~ 2002  . COLECTOMY WITH COLOSTOMY CREATION/HARTMANN PROCEDURE N/A 03/05/2015   Procedure: PARTIAL COLECTOMY WITH COLOSTOMY CREATION/HARTMANN PROCEDURE;  Surgeon: Aviva Signs, MD;  Location: [facility];  Service: General;  Laterality: N/A;  . CORONARY ANGIOPLASTY WITH STENT PLACEMENT   05/08/11   "1"  . ESOPHAGEAL MANOMETRY N/A 06/06/2014   Procedure: ESOPHAGEAL MANOMETRY (EM);  Surgeon: Jerene Bears, MD;  Location: [facility];  Service: Gastroenterology;  Laterality: N/A;  . ESOPHAGOGASTRODUODENOSCOPY (EGD) WITH PROPOFOL N/A 08/04/2015   Procedure: ESOPHAGOGASTRODUODENOSCOPY (EGD) WITH PROPOFOL;  Surgeon: Mauri Pole, MD;  Location: [facility];  Service: Endoscopy;  Laterality: N/A;  . HERNIA REPAIR    . IR GENERIC HISTORICAL  03/04/2016   IR LUMBAR DISC ASPIRATION W/IMG GUIDE 03/04/2016 Luanne Bras, MD [facility]  . LAPAROSCOPIC NISSEN FUNDOPLICATION N/A [id number]   Procedure: LAPAROSCOPIC  LYSIS OF ADHESIONS, SEGMENTAL GASTRECTOMY, PARTIAL REDUCTION OF HERNIA;  Surgeon: [name], MD;  Location: [facility];  Service: General;  Laterality: N/A;  . NISSEN FUNDOPLICATION  [id number]  . PERCUTANEOUS CORONARY STENT INTERVENTION (PCI-S) N/A 05/08/2011   Procedure: PERCUTANEOUS CORONARY STENT INTERVENTION (PCI-S);  Surgeon: Wellington Hampshire, MD;  Location: [facility] CATH LAB;  Service: Cardiovascular;  Laterality: N/A;  . ROTATOR CUFF REPAIR  ~ 2001   right  . SHOULDER ARTHROSCOPY  ~ 2004; 2005   left; "joint's wore out"  . TONSILLECTOMY  ~ 1944  . TUBAL LIGATION  1972    Family History  Problem Relation Age of Onset  . Heart attack Father   . Hypertension Father   . Diabetes Mother   . Hypertension Mother   . Skin cancer Brother     melanoma  . Colon cancer Neg Hx   . Esophageal cancer Neg Hx   . Rectal cancer Neg Hx   . Stomach cancer Neg Hx     Review of Systems  Constitutional: Positive for activity change and fatigue. Negative for fever and unexpected weight change.  HENT: Negative.   Eyes: Negative.   Respiratory: Negative.  Negative for chest tightness, shortness of breath and wheezing.   Cardiovascular: Negative.  Negative for chest pain, palpitations and leg swelling.  Gastrointestinal: Negative.   Genitourinary: Negative.   Musculoskeletal: Positive  for arthralgias, back pain, gait problem and myalgias.  Neurological: Positive for weakness. Negative for tremors.      Medication List       Accurate as of 03/11/16 12:21 PM. Always use your most recent med list.          albuterol 108 (90 Base) MCG/ACT inhaler Commonly known as:  PROVENTIL HFA;VENTOLIN HFA Inhale 2 puffs into the lungs every 6 (six) hours as needed for wheezing or shortness of breath.   ALPRAZolam 0.25 MG tablet Commonly known as:  XANAX Take 1 tablet (0.25 mg total) by mouth 2 (two) times daily as needed for anxiety.   cefTRIAXone 2 g in dextrose 5 % 50 mL Inject 2 g into the vein daily. Total 6 week supply starting from 03/05/2016. Home health RN and pharmacy to monitor and dispense  as needed.   citalopram 40 MG tablet Commonly known as:  CELEXA Take 20 mg by mouth at bedtime.   esomeprazole 40 MG capsule Commonly known as:  NEXIUM Take 40 mg by mouth daily before breakfast.   ferrous sulfate 325 (65 FE) MG EC tablet Take 325 mg by mouth daily.   furosemide 20 MG tablet Commonly known as:  LASIX Take 1 tablet (20 mg total) by mouth daily.   hydrALAZINE 50 MG tablet Commonly known as:  APRESOLINE Take 1 tablet (50 mg total) by mouth 3 (three) times daily.   isosorbide mononitrate 30 MG 24 hr tablet Commonly known as:  IMDUR Take 30 mg by mouth daily.   loratadine 10 MG tablet Commonly known as:  CLARITIN Take 10 mg by mouth daily.   losartan 100 MG tablet Commonly known as:  COZAAR Take 100 mg by mouth daily.   metoprolol succinate 25 MG 24 hr tablet Commonly known as:  TOPROL-XL Take 1 tablet (25 mg total) by mouth daily.   nitroGLYCERIN 0.4 MG SL tablet Commonly known as:  NITROSTAT Place 0.4 mg under the tongue every 5 (five) minutes as needed for chest pain. Reported on 04/26/2015   oxyCODONE 5 MG immediate release tablet Commonly known as:  Oxy IR/ROXICODONE Take 1 tablet (5 mg total) by mouth every 6 (six) hours as needed for  moderate pain.   polyethylene glycol powder powder Commonly known as:  GLYCOLAX/MIRALAX MIX 17 GRAMS IN AT LEAST 8OZ OF WATER/JUICE AND DRINK ONCE OR TWICE DAILY   potassium chloride 10 MEQ tablet Commonly known as:  K-DUR Take 1 tablet (10 mEq total) by mouth daily.   PRESERVISION AREDS Tabs Take 1 tablet by mouth 2 (two) times daily.   rosuvastatin 10 MG tablet Commonly known as:  CRESTOR Take 10 mg by mouth at bedtime.   traZODone 100 MG tablet Commonly known as:  DESYREL Take 100 mg by mouth at bedtime.   Vancomycin 750-5 MG/150ML-% Soln Commonly known as:  VANCOCIN Inject 150 mLs (750 mg total) into the vein daily. Total 6 week supply starting from 03/05/2016. Home health RN and pharmacy to monitor and dispense as needed.   Vitamin D (Ergocalciferol) 50000 units Caps capsule Commonly known as:  DRISDOL Take 50,000 Units by mouth every Tuesday.   zolpidem 10 MG tablet Commonly known as:  AMBIEN Take 10 mg by mouth at bedtime.          Objective:    BP (!) 183/74   Pulse (!) 48   Temp 97 F (36.1 C) (Oral)   Ht 5\' 2"  (1.575 m)   Wt 131 lb (59.4 kg)   BMI 23.96 kg/m   Allergies  Allergen Reactions  . Sulfamethoxazole Rash    Wt Readings from Last 3 Encounters:  03/11/16 131 lb (59.4 kg)  03/01/16 131 lb 1.6 oz (59.5 kg)  02/12/16 132 lb 12.8 oz (60.2 kg)    Physical Exam  Constitutional: She is oriented to person, place, and time. She appears well-developed and well-nourished. No distress.  HENT:  Head: Normocephalic and atraumatic.  Eyes: Conjunctivae and EOM are normal. Pupils are equal, round, and reactive to light.  Cardiovascular: Normal rate, regular rhythm, normal heart sounds and intact distal pulses.   Pulmonary/Chest: Effort normal and breath sounds normal.  Abdominal: Soft. Bowel sounds are normal.  Neurological: She is alert and oriented to person, place, and time. She has normal reflexes.  Skin: Skin is warm and dry. No rash noted.  Psychiatric: She has a normal mood and affect. Her behavior is normal. Judgment and thought content normal.        Assessment & Plan:   1. Discitis of lumbar region Follow protocol of neurosurgeon and infectious disease from [facility] care with daily infusion.  2. Essential hypertension - losartan (COZAAR) 100 MG tablet; Take 100 mg by mouth daily. - furosemide (LASIX) 20 MG tablet; Take 1 tablet (20 mg total) by mouth daily.  Dispense: 30 tablet; Refill: 11 - potassium chloride (K-DUR) 10 MEQ tablet; Take 1 tablet (10 mEq total) by mouth daily.  Dispense: 30 tablet; Refill: 11    Continue all other maintenance medications as listed above.  Follow up plan: Return in about 2 months (around 05/12/2016) for recheck.  Educational handout given for htn.  Terald Sleeper PA-C [facility] [address]  [place], [place] [address] [phone]   03/11/2016, 12:21 PM

## 2016-03-18 ENCOUNTER — Encounter: Payer: Self-pay | Admitting: Infectious Diseases

## 2016-03-24 ENCOUNTER — Other Ambulatory Visit (HOSPITAL_COMMUNITY)
Admission: AD | Admit: 2016-03-24 | Discharge: 2016-03-24 | Disposition: A | Discharge status: Home / Self Care | Payer: Medicare Other | Source: Other Acute Inpatient Hospital | Attending: Infectious Diseases | Admitting: Infectious Diseases

## 2016-03-24 ENCOUNTER — Encounter: Payer: Self-pay | Admitting: Infectious Diseases

## 2016-03-24 DIAGNOSIS — Z5181 Encounter for therapeutic drug level monitoring: Secondary | ICD-10-CM | POA: Insufficient documentation

## 2016-03-24 LAB — VANCOMYCIN, TROUGH: VANCOMYCIN TR: 16 ug/mL (ref 15–20)

## 2016-03-24 LAB — CBC WITH DIFFERENTIAL/PLATELET
Basophils Absolute: 0 10*3/uL (ref 0.0–0.1)
Basophils Relative: 0 %
Eosinophils Absolute: 0.3 10*3/uL (ref 0.0–0.7)
Eosinophils Relative: 7 %
HEMATOCRIT: 31.6 % — AB (ref 36.0–46.0)
HEMOGLOBIN: 9.8 g/dL — AB (ref 12.0–15.0)
LYMPHS ABS: 1.6 10*3/uL (ref 0.7–4.0)
LYMPHS PCT: 33 %
MCH: 25.9 pg — AB (ref 26.0–34.0)
MCHC: 31 g/dL (ref 30.0–36.0)
MCV: 83.4 fL (ref 78.0–100.0)
MONOS PCT: 9 %
Monocytes Absolute: 0.4 10*3/uL (ref 0.1–1.0)
NEUTROS ABS: 2.3 10*3/uL (ref 1.7–7.7)
NEUTROS PCT: 51 %
Platelets: 221 10*3/uL (ref 150–400)
RBC: 3.79 MIL/uL — AB (ref 3.87–5.11)
RDW: 17 % — ABNORMAL HIGH (ref 11.5–15.5)
WBC: 4.6 10*3/uL (ref 4.0–10.5)

## 2016-03-24 LAB — COMPREHENSIVE METABOLIC PANEL
ALK PHOS: 57 U/L (ref 38–126)
ALT: 7 U/L — AB (ref 14–54)
ANION GAP: 7 (ref 5–15)
AST: 12 U/L — ABNORMAL LOW (ref 15–41)
Albumin: 3.4 g/dL — ABNORMAL LOW (ref 3.5–5.0)
BILIRUBIN TOTAL: 0.4 mg/dL (ref 0.3–1.2)
BUN: 15 mg/dL (ref 6–20)
CALCIUM: 8.9 mg/dL (ref 8.9–10.3)
CO2: 27 mmol/L (ref 22–32)
CREATININE: 1.23 mg/dL — AB (ref 0.44–1.00)
Chloride: 105 mmol/L (ref 101–111)
GFR calc non Af Amer: 41 mL/min — ABNORMAL LOW (ref >60)
GFR, EST AFRICAN AMERICAN: 47 mL/min — AB (ref >60)
GLUCOSE: 113 mg/dL — AB (ref 65–99)
Potassium: 3.8 mmol/L (ref 3.5–5.1)
SODIUM: 139 mmol/L (ref 135–145)
TOTAL PROTEIN: 6.1 g/dL — AB (ref 6.5–8.1)

## 2016-03-24 LAB — SEDIMENTATION RATE: Sed Rate: 12 mm/hr (ref 0–22)

## 2016-03-24 LAB — C-REACTIVE PROTEIN: CRP: 0.9 mg/dL (ref <1.0)

## 2016-04-02 ENCOUNTER — Other Ambulatory Visit: Payer: Self-pay | Admitting: Physician Assistant

## 2016-04-02 DIAGNOSIS — M533 Sacrococcygeal disorders, not elsewhere classified: Secondary | ICD-10-CM

## 2016-04-04 ENCOUNTER — Other Ambulatory Visit (HOSPITAL_COMMUNITY)
Admission: RE | Admit: 2016-04-04 | Discharge: 2016-04-04 | Disposition: A | Discharge status: Home / Self Care | Payer: Medicare Other | Source: Other Acute Inpatient Hospital | Attending: Infectious Diseases | Admitting: Infectious Diseases

## 2016-04-04 DIAGNOSIS — M4646 Discitis, unspecified, lumbar region: Secondary | ICD-10-CM | POA: Insufficient documentation

## 2016-04-04 LAB — CBC WITH DIFFERENTIAL/PLATELET
BASOS ABS: 0 10*3/uL (ref 0.0–0.1)
Basophils Relative: 1 %
Eosinophils Absolute: 0.3 10*3/uL (ref 0.0–0.7)
Eosinophils Relative: 13 %
HEMATOCRIT: 31.7 % — AB (ref 36.0–46.0)
Hemoglobin: 9.8 g/dL — ABNORMAL LOW (ref 12.0–15.0)
LYMPHS ABS: 1.2 10*3/uL (ref 0.7–4.0)
LYMPHS PCT: 59 %
MCH: 25.8 pg — AB (ref 26.0–34.0)
MCHC: 30.9 g/dL (ref 30.0–36.0)
MCV: 83.4 fL (ref 78.0–100.0)
Monocytes Absolute: 0.5 10*3/uL (ref 0.1–1.0)
Monocytes Relative: 24 %
NEUTROS ABS: 0.1 10*3/uL — AB (ref 1.7–7.7)
Neutrophils Relative %: 4 %
Platelets: 177 10*3/uL (ref 150–400)
RBC: 3.8 MIL/uL — AB (ref 3.87–5.11)
RDW: 16.9 % — ABNORMAL HIGH (ref 11.5–15.5)
WBC: 2.1 10*3/uL — AB (ref 4.0–10.5)

## 2016-04-04 LAB — VANCOMYCIN, TROUGH: VANCOMYCIN TR: 13 ug/mL — AB (ref 15–20)

## 2016-04-04 LAB — COMPREHENSIVE METABOLIC PANEL
ALK PHOS: 62 U/L (ref 38–126)
ALT: 7 U/L — AB (ref 14–54)
AST: 14 U/L — AB (ref 15–41)
Albumin: 3.4 g/dL — ABNORMAL LOW (ref 3.5–5.0)
Anion gap: 7 (ref 5–15)
BILIRUBIN TOTAL: 0.3 mg/dL (ref 0.3–1.2)
BUN: 18 mg/dL (ref 6–20)
CALCIUM: 8.6 mg/dL — AB (ref 8.9–10.3)
CO2: 26 mmol/L (ref 22–32)
CREATININE: 1.14 mg/dL — AB (ref 0.44–1.00)
Chloride: 105 mmol/L (ref 101–111)
GFR, EST AFRICAN AMERICAN: 52 mL/min — AB (ref >60)
GFR, EST NON AFRICAN AMERICAN: 45 mL/min — AB (ref >60)
Glucose, Bld: 98 mg/dL (ref 65–99)
Potassium: 4.2 mmol/L (ref 3.5–5.1)
Sodium: 138 mmol/L (ref 135–145)
TOTAL PROTEIN: 5.7 g/dL — AB (ref 6.5–8.1)

## 2016-04-04 LAB — SEDIMENTATION RATE: SED RATE: 10 mm/h (ref 0–22)

## 2016-04-09 ENCOUNTER — Telehealth: Payer: Self-pay

## 2016-04-09 NOTE — Telephone Encounter (Signed)
Repeat one week, has she had any illness symptoms at all?

## 2016-04-10 ENCOUNTER — Telehealth: Payer: Self-pay | Admitting: Pharmacist

## 2016-04-10 NOTE — Telephone Encounter (Signed)
Patient's wbc is trending down on vanc (7.1 on 12/11, 4.4 on 12/18, 2 on 1/8).  Per Dr. Johnnye Sima, called Amy, PharmD at [facility], and stopped patient's vancomycin. Will continue ceftriaxone until her stop date of 1/15.

## 2016-04-16 LAB — ACID FAST CULTURE WITH REFLEXED SENSITIVITIES: ACID FAST CULTURE - AFSCU3: NEGATIVE

## 2016-04-18 ENCOUNTER — Inpatient Hospital Stay: Payer: Medicare Other | Admitting: Infectious Diseases

## 2016-04-25 ENCOUNTER — Other Ambulatory Visit: Payer: Self-pay | Admitting: Physician Assistant

## 2016-04-26 NOTE — Telephone Encounter (Signed)
Rx called in to pharmacy. 

## 2016-04-26 NOTE — Telephone Encounter (Signed)
Please call in

## 2016-04-30 ENCOUNTER — Other Ambulatory Visit: Payer: Self-pay | Admitting: Physician Assistant

## 2016-05-02 ENCOUNTER — Other Ambulatory Visit: Payer: Self-pay | Admitting: Physician Assistant

## 2016-05-13 ENCOUNTER — Ambulatory Visit (INDEPENDENT_AMBULATORY_CARE_PROVIDER_SITE_OTHER): Payer: Medicare Other | Admitting: Physician Assistant

## 2016-05-13 ENCOUNTER — Encounter: Payer: Self-pay | Admitting: Physician Assistant

## 2016-05-13 VITALS — BP 119/60 | HR 61 | Temp 97.4°F | Ht 62.0 in | Wt 130.8 lb

## 2016-05-13 DIAGNOSIS — N3001 Acute cystitis with hematuria: Secondary | ICD-10-CM

## 2016-05-13 DIAGNOSIS — R1030 Lower abdominal pain, unspecified: Secondary | ICD-10-CM | POA: Diagnosis not present

## 2016-05-13 LAB — URINALYSIS
BILIRUBIN UA: NEGATIVE
GLUCOSE, UA: NEGATIVE
KETONES UA: NEGATIVE
Leukocytes, UA: NEGATIVE
NITRITE UA: NEGATIVE
Specific Gravity, UA: 1.015 (ref 1.005–1.030)
UUROB: 0.2 mg/dL (ref 0.2–1.0)
pH, UA: 6 (ref 5.0–7.5)

## 2016-05-13 MED ORDER — NITROFURANTOIN MONOHYD MACRO 100 MG PO CAPS: 100.0000 mg | ORAL_CAPSULE | Freq: Two times a day (BID) | ORAL | 0 refills | Status: DC

## 2016-05-13 NOTE — Patient Instructions (Signed)
In a few days you may receive a survey in the mail or online from [employer] regarding your visit with [place] today. Please take a moment to fill this out. Your feedback is very important to our whole office. It can help [place] better understand your needs as well as improve your experience and satisfaction. Thank you for taking your time to complete it. We care about you.  Particia Nearing, PA-C    Acute Urinary Retention, Female Urinary retention means you are unable to pee completely or at all (empty your bladder). Follow these instructions at home:  Drink enough fluids to keep your pee (urine) clear or pale yellow.  If you are sent home with a tube that drains the bladder (catheter), there will be a drainage bag attached to it. There are two types of bags. One is big that you can wear at night without having to empty it. One is smaller and needs to be emptied more often.  Keep the drainage bag emptied.  Keep the drainage bag lower than the tube.  Only take medicine as told by your doctor. Contact a doctor if:  You have a low-grade fever.  You have spasms or you are leaking pee when you have spasms. Get help right away if:  You have chills or a fever.  Your catheter stops draining pee.  Your catheter falls out.  You have increased bleeding that does not stop after you have rested and increased the amount of fluids you had been drinking. This information is not intended to replace advice given to you by your health care provider. Make sure you discuss any questions you have with your health care provider. Document Released: 09/04/2007 Document Revised: 08/24/2015 Document Reviewed: 08/27/2012 Elsevier Interactive Patient Education  2017 [employer].

## 2016-05-13 NOTE — Progress Notes (Signed)
BP 119/60   Pulse 61   Temp 97.4 F (36.3 C) (Oral)   Ht 5\' 2"  (1.575 m)   Wt 130 lb 12.8 oz (59.3 kg)   BMI 23.92 kg/m    Subjective:    Patient ID: [name], female    DOB: 1934-11-06, 82 y.o.   MRN: [id number]  HPI: [name] is a 82 y.o. female presenting on 05/13/2016 for Abdominal Pain; Nausea; and Follow-up (2 month follow up )  Patient coming in for a 2 month recheck but in the previous week she has had increasing lower abdominal pain and pressure. She denies any severe dysuria, frequency, nocturia. She said the pain is not like the pain she experienced with her volvulus. She has not had any change in her bowel habit. She does wear her colostomy bag and everything still looks the same. She denies any fever or chills. She states that her disc abscess from her lumbar spine is greatly improved and is not causing her any difficulty at all this time. I've given her instructions that if anything changes or worsens to get in touch with [place] or to get to the emergency room as soon as possible.  Relevant past medical, surgical, family and social history reviewed and updated as indicated. Allergies and medications reviewed and updated.  Past Medical History:  Diagnosis Date  . Anemia   . Anxiety state, unspecified   . Atrial fibrillation with RVR ([place])   . [name]'s esophagus   . Bilateral sciatica   . Cellulitis of extremity   . Chronic back pain   . Congestive heart failure, unspecified    diastolic heart failure  . Coronary artery disease    a. Moderate to severe coronary disease involving the left anterior descending artery and right coronary artery.  Sequential stenosis in the LAD is significant.  Right coronary artery is moderate to severely likely nonischemic. Questionable small LVOT obstruction.  No Brockenbrough maneuver was performed.  Catheterization January 2013; b. 05/2014 MV no isch/infarct, EF 60%.  . Depressive disorder, not elsewhere classified   . Diverticulitis     . Erosive esophagitis 08/04/2015  . Esophageal reflux   . Gastric volvulus 08/04/2015  . Gastritis   . H/O hiatal hernia   . Hypertension   . IBS (irritable bowel syndrome)   . Infection of esophagostomy ([place])   . Macular degeneration   . Melanoma ([place])    removal on back   . Pelvic abscess in female   . Perforated bowel ([place])   . Perforated sigmoid colon ([place])   . PONV (postoperative nausea and vomiting)   . Pulmonary hypertension    PA systolic pressure [id number] mmHg by echocardiogram, PA pressure 33/10 by cardiac catheterization PA saturation 62% thermodilution cardiac index 2.0 thick cardiac index 2.4  . Pure hypercholesterolemia   . Tricuspid regurgitation    moderate tricuspid regurgitation by echocardiogram, prior use of anorexic agents    Past Surgical History:  Procedure Laterality Date  . CARDIAC CATHETERIZATION Left 03/05/2015   Procedure: CENTRAL LINE INSERTION;  Surgeon: Aviva Signs, MD;  Location: [facility];  Service: General;  Laterality: Left;  . CATARACT EXTRACTION W/ INTRAOCULAR LENS  IMPLANT, BILATERAL  ~ 2002  . COLECTOMY WITH COLOSTOMY CREATION/HARTMANN PROCEDURE N/A 03/05/2015   Procedure: PARTIAL COLECTOMY WITH COLOSTOMY CREATION/HARTMANN PROCEDURE;  Surgeon: Aviva Signs, MD;  Location: [facility];  Service: General;  Laterality: N/A;  . CORONARY ANGIOPLASTY WITH STENT PLACEMENT  05/08/11   "1"  .  ESOPHAGEAL MANOMETRY N/A 06/06/2014   Procedure: ESOPHAGEAL MANOMETRY (EM);  Surgeon: Jerene Bears, MD;  [facility]: [facility];  Service: Gastroenterology;  Laterality: N/A;  . ESOPHAGOGASTRODUODENOSCOPY (EGD) WITH PROPOFOL N/A 08/04/2015   Procedure: ESOPHAGOGASTRODUODENOSCOPY (EGD) WITH PROPOFOL;  Surgeon: Mauri Pole, MD;  Location: [facility];  Service: Endoscopy;  Laterality: N/A;  . HERNIA REPAIR    . IR GENERIC HISTORICAL  03/04/2016   IR LUMBAR DISC ASPIRATION W/IMG GUIDE 03/04/2016 Luanne Bras, MD [facility]  . LAPAROSCOPIC NISSEN FUNDOPLICATION N/A  123456   Procedure: LAPAROSCOPIC  LYSIS OF ADHESIONS, SEGMENTAL GASTRECTOMY, PARTIAL REDUCTION OF HERNIA;  Surgeon: [name], MD;  Location: [facility];  Service: General;  Laterality: N/A;  . NISSEN FUNDOPLICATION  99991111  . PERCUTANEOUS CORONARY STENT INTERVENTION (PCI-S) N/A 05/08/2011   Procedure: PERCUTANEOUS CORONARY STENT INTERVENTION (PCI-S);  Surgeon: Wellington Hampshire, MD;  Location: [facility] CATH LAB;  Service: Cardiovascular;  Laterality: N/A;  . ROTATOR CUFF REPAIR  ~ 2001   right  . SHOULDER ARTHROSCOPY  ~ 2004; 2005   left; "joint's wore out"  . TONSILLECTOMY  ~ 1944  . TUBAL LIGATION  1972    Review of Systems  Constitutional: Positive for fatigue. Negative for chills and fever.  HENT: Negative.   Eyes: Negative.   Respiratory: Negative.  Negative for cough and wheezing.   Cardiovascular: Negative for chest pain, palpitations and leg swelling.  Gastrointestinal: Negative.   Genitourinary: Positive for difficulty urinating, dysuria and urgency. Negative for flank pain.    Allergies as of 05/13/2016      Reactions   Sulfamethoxazole Rash      Medication List       Accurate as of 05/13/16  9:29 AM. Always use your most recent med list.          albuterol 108 (90 Base) MCG/ACT inhaler Commonly known as:  PROVENTIL HFA;VENTOLIN HFA Inhale 2 puffs into the lungs every 6 (six) hours as needed for wheezing or shortness of breath.   ALPRAZolam 0.25 MG tablet Commonly known as:  XANAX TAKE 1/2 TO 1 TABLET 3 TIMES DAILY AS NEEDED   citalopram 40 MG tablet Commonly known as:  CELEXA Take 20 mg by mouth at bedtime.   esomeprazole 40 MG capsule Commonly known as:  NEXIUM Take 40 mg by mouth daily before breakfast.   ferrous sulfate 325 (65 FE) MG EC tablet Take 325 mg by mouth daily.   furosemide 20 MG tablet Commonly known as:  LASIX Take 1 tablet (20 mg total) by mouth daily.   hydrALAZINE 50 MG tablet Commonly known as:  APRESOLINE Take 1 tablet (50 mg  total) by mouth 3 (three) times daily.   isosorbide mononitrate 30 MG 24 hr tablet Commonly known as:  IMDUR Take 30 mg by mouth daily.   loratadine 10 MG tablet Commonly known as:  CLARITIN Take 10 mg by mouth daily.   losartan 100 MG tablet Commonly known as:  COZAAR Take 100 mg by mouth daily.   metoprolol succinate 25 MG 24 hr tablet Commonly known as:  TOPROL-XL Take 1 tablet (25 mg total) by mouth daily.   nitrofurantoin (macrocrystal-monohydrate) 100 MG capsule Commonly known as:  MACROBID Take 1 capsule (100 mg total) by mouth 2 (two) times daily. 1 po BId   nitroGLYCERIN 0.4 MG SL tablet Commonly known as:  NITROSTAT Place 0.4 mg under the tongue every 5 (five) minutes as needed for chest pain. Reported on 04/26/2015   polyethylene glycol powder  powder Commonly known as:  GLYCOLAX/MIRALAX MIX 17 GRAMS IN AT LEAST 8OZ OF WATER/JUICE AND DRINK ONCE OR TWICE DAILY   potassium chloride 10 MEQ tablet Commonly known as:  K-DUR Take 1 tablet (10 mEq total) by mouth daily.   PRESERVISION AREDS Tabs Take 1 tablet by mouth 2 (two) times daily.   rosuvastatin 10 MG tablet Commonly known as:  CRESTOR Take 10 mg by mouth at bedtime.   traZODone 100 MG tablet Commonly known as:  DESYREL Take 100 mg by mouth at bedtime.   Vitamin D (Ergocalciferol) 50000 units Caps capsule Commonly known as:  DRISDOL TAKE 1 CAPSULE EVERY WEEK   zolpidem 10 MG tablet Commonly known as:  AMBIEN TAKE ONE (1) TABLET AT BEDTIME          Objective:    BP 119/60   Pulse 61   Temp 97.4 F (36.3 C) (Oral)   Ht 5\' 2"  (1.575 m)   Wt 130 lb 12.8 oz (59.3 kg)   BMI 23.92 kg/m   Allergies  Allergen Reactions  . Sulfamethoxazole Rash    Physical Exam  Constitutional: She is oriented to person, place, and time. She appears well-developed and well-nourished.  HENT:  Head: Normocephalic and atraumatic.  Eyes: Conjunctivae are normal. Pupils are equal, round, and reactive to light.    Cardiovascular: Normal rate, regular rhythm, normal heart sounds and intact distal pulses.   Pulmonary/Chest: Effort normal and breath sounds normal.  Abdominal: Soft. She exhibits no distension and no mass. Bowel sounds are increased. There is tenderness in the right lower quadrant, suprapubic area and left lower quadrant. There is no rebound, no guarding and no CVA tenderness.    Colostomy placement Has not eaten today  Neurological: She is alert and oriented to person, place, and time. She has normal reflexes.  Skin: Skin is warm and dry. No rash noted.  Psychiatric: She has a normal mood and affect. Her behavior is normal. Judgment and thought content normal.        Assessment & Plan:   1. Lower abdominal pain - Urinalysis - Urine culture  2. Acute cystitis with hematuria - nitrofurantoin, macrocrystal-monohydrate, (MACROBID) 100 MG capsule; Take 1 capsule (100 mg total) by mouth 2 (two) times daily. 1 po BId  Dispense: 14 capsule; Refill: 0   Continue all other maintenance medications as listed above.  Follow up plan: Return in about 3 months (around 08/10/2016) for recheck.  Educational handout given for UTI  Terald Sleeper PA-C [place] [address]  [place], [place] [address] [phone]   05/13/2016, 9:29 AM

## 2016-05-17 LAB — URINE CULTURE

## 2016-05-21 ENCOUNTER — Telehealth: Payer: Self-pay | Admitting: Physician Assistant

## 2016-05-22 NOTE — Telephone Encounter (Signed)
Notes faxed.

## 2016-05-28 ENCOUNTER — Telehealth: Payer: Self-pay | Admitting: Physician Assistant

## 2016-05-29 ENCOUNTER — Ambulatory Visit (INDEPENDENT_AMBULATORY_CARE_PROVIDER_SITE_OTHER): Payer: Medicare Other | Admitting: Family Medicine

## 2016-05-29 ENCOUNTER — Encounter: Payer: Medicare Other | Admitting: *Deleted

## 2016-05-29 DIAGNOSIS — G062 Extradural and subdural abscess, unspecified: Secondary | ICD-10-CM | POA: Diagnosis not present

## 2016-05-29 DIAGNOSIS — Z452 Encounter for adjustment and management of vascular access device: Secondary | ICD-10-CM | POA: Diagnosis not present

## 2016-05-29 DIAGNOSIS — I130 Hypertensive heart and chronic kidney disease with heart failure and stage 1 through stage 4 chronic kidney disease, or unspecified chronic kidney disease: Secondary | ICD-10-CM | POA: Diagnosis not present

## 2016-05-29 DIAGNOSIS — M4646 Discitis, unspecified, lumbar region: Secondary | ICD-10-CM

## 2016-05-29 NOTE — Telephone Encounter (Signed)
Spoke with Santiago Glad at [facility] and she confirmed that she has the ov note 05/13/16. Pt aware

## 2016-05-30 ENCOUNTER — Other Ambulatory Visit: Payer: Self-pay | Admitting: Physician Assistant

## 2016-05-31 ENCOUNTER — Emergency Department (HOSPITAL_COMMUNITY)
Admission: EM | Admit: 2016-05-31 | Discharge: 2016-05-31 | Disposition: A | Discharge status: Home / Self Care | Payer: Medicare Other | Attending: Emergency Medicine | Admitting: Emergency Medicine

## 2016-05-31 ENCOUNTER — Emergency Department (HOSPITAL_COMMUNITY): Payer: Medicare Other

## 2016-05-31 ENCOUNTER — Encounter (HOSPITAL_COMMUNITY): Payer: Self-pay | Admitting: Emergency Medicine

## 2016-05-31 DIAGNOSIS — I2510 Atherosclerotic heart disease of native coronary artery without angina pectoris: Secondary | ICD-10-CM | POA: Diagnosis not present

## 2016-05-31 DIAGNOSIS — R1031 Right lower quadrant pain: Secondary | ICD-10-CM | POA: Insufficient documentation

## 2016-05-31 DIAGNOSIS — Z87891 Personal history of nicotine dependence: Secondary | ICD-10-CM | POA: Diagnosis not present

## 2016-05-31 DIAGNOSIS — R52 Pain, unspecified: Secondary | ICD-10-CM

## 2016-05-31 DIAGNOSIS — Z79899 Other long term (current) drug therapy: Secondary | ICD-10-CM | POA: Diagnosis not present

## 2016-05-31 DIAGNOSIS — I130 Hypertensive heart and chronic kidney disease with heart failure and stage 1 through stage 4 chronic kidney disease, or unspecified chronic kidney disease: Secondary | ICD-10-CM | POA: Insufficient documentation

## 2016-05-31 DIAGNOSIS — I509 Heart failure, unspecified: Secondary | ICD-10-CM | POA: Insufficient documentation

## 2016-05-31 DIAGNOSIS — N183 Chronic kidney disease, stage 3 (moderate): Secondary | ICD-10-CM | POA: Diagnosis not present

## 2016-05-31 DIAGNOSIS — R1030 Lower abdominal pain, unspecified: Secondary | ICD-10-CM

## 2016-05-31 LAB — COMPREHENSIVE METABOLIC PANEL
ALBUMIN: 3.3 g/dL — AB (ref 3.5–5.0)
ALT: 7 U/L — AB (ref 14–54)
AST: 13 U/L — AB (ref 15–41)
Alkaline Phosphatase: 65 U/L (ref 38–126)
Anion gap: 9 (ref 5–15)
BUN: 19 mg/dL (ref 6–20)
CHLORIDE: 101 mmol/L (ref 101–111)
CO2: 25 mmol/L (ref 22–32)
CREATININE: 1.36 mg/dL — AB (ref 0.44–1.00)
Calcium: 9 mg/dL (ref 8.9–10.3)
GFR calc Af Amer: 42 mL/min — ABNORMAL LOW (ref >60)
GFR calc non Af Amer: 36 mL/min — ABNORMAL LOW (ref >60)
Glucose, Bld: 93 mg/dL (ref 65–99)
Potassium: 4 mmol/L (ref 3.5–5.1)
SODIUM: 135 mmol/L (ref 135–145)
Total Bilirubin: 0.7 mg/dL (ref 0.3–1.2)
Total Protein: 6.7 g/dL (ref 6.5–8.1)

## 2016-05-31 LAB — URINALYSIS, ROUTINE W REFLEX MICROSCOPIC
Bacteria, UA: NONE SEEN
Bilirubin Urine: NEGATIVE
GLUCOSE, UA: NEGATIVE mg/dL
Hgb urine dipstick: NEGATIVE
Ketones, ur: 5 mg/dL — AB
Leukocytes, UA: NEGATIVE
Nitrite: NEGATIVE
Protein, ur: 30 mg/dL — AB
Specific Gravity, Urine: 1.023 (ref 1.005–1.030)
pH: 5 (ref 5.0–8.0)

## 2016-05-31 LAB — CBC WITH DIFFERENTIAL/PLATELET
BASOS ABS: 0 10*3/uL (ref 0.0–0.1)
Basophils Relative: 0 %
EOS ABS: 0.1 10*3/uL (ref 0.0–0.7)
EOS PCT: 1 %
HCT: 34.1 % — ABNORMAL LOW (ref 36.0–46.0)
Hemoglobin: 11 g/dL — ABNORMAL LOW (ref 12.0–15.0)
LYMPHS PCT: 15 %
Lymphs Abs: 1.1 10*3/uL (ref 0.7–4.0)
MCH: 25.9 pg — ABNORMAL LOW (ref 26.0–34.0)
MCHC: 32.3 g/dL (ref 30.0–36.0)
MCV: 80.2 fL (ref 78.0–100.0)
Monocytes Absolute: 0.4 10*3/uL (ref 0.1–1.0)
Monocytes Relative: 5 %
Neutro Abs: 5.6 10*3/uL (ref 1.7–7.7)
Neutrophils Relative %: 79 %
PLATELETS: 185 10*3/uL (ref 150–400)
RBC: 4.25 MIL/uL (ref 3.87–5.11)
RDW: 14.6 % (ref 11.5–15.5)
WBC: 7.1 10*3/uL (ref 4.0–10.5)

## 2016-05-31 LAB — LIPASE, BLOOD: Lipase: <10 U/L — ABNORMAL LOW (ref 11–51)

## 2016-05-31 MED ORDER — IOPAMIDOL (ISOVUE-300) INJECTION 61%
INTRAVENOUS | Status: AC
  Administered 2016-05-31: 30 mL via ORAL
  Filled 2016-05-31: qty 30

## 2016-05-31 MED ORDER — IOPAMIDOL (ISOVUE-300) INJECTION 61%
75.0000 mL | Freq: Once | INTRAVENOUS | Status: AC | PRN
  Administered 2016-05-31: 75 mL via INTRAVENOUS

## 2016-05-31 MED ORDER — HYDROCODONE-ACETAMINOPHEN 5-325 MG PO TABS: 1.0000 | ORAL_TABLET | Freq: Four times a day (QID) | ORAL | 0 refills | Status: DC | PRN

## 2016-05-31 MED ORDER — ONDANSETRON HCL 4 MG/2ML IJ SOLN
4.0000 mg | Freq: Once | INTRAMUSCULAR | Status: AC
  Administered 2016-05-31: 4 mg via INTRAVENOUS
  Filled 2016-05-31: qty 2

## 2016-05-31 MED ORDER — SODIUM CHLORIDE 0.9 % IV BOLUS (SEPSIS)
500.0000 mL | Freq: Once | INTRAVENOUS | Status: AC
  Administered 2016-05-31: 500 mL via INTRAVENOUS

## 2016-05-31 NOTE — ED Provider Notes (Signed)
[place] DEPT Provider Note   CSN: [id number] Arrival date & time: 05/31/16  1310     History   Chief Complaint Chief Complaint  Patient presents with  . Abdominal Pain    HPI [name] is a 82 y.o. female.  Patient states that she's been having abdominal pain for a few months usually after she eats. She has lower abdominal pain and upper abdominal pain    Abdominal Pain   This is a new problem. The current episode started more than 1 week ago. The problem occurs daily. The problem has not changed since onset.The pain is associated with eating. The pain is located in the RUQ and RLQ. The pain is at a severity of 6/10. The pain is moderate. Associated symptoms include anorexia. Pertinent negatives include diarrhea, frequency, hematuria and headaches. Nothing aggravates the symptoms.    Past Medical History:  Diagnosis Date  . Anemia   . Anxiety state, unspecified   . Atrial fibrillation with RVR ([place])   . [name]'s esophagus   . Bilateral sciatica   . Cellulitis of extremity   . Chronic back pain   . Congestive heart failure, unspecified    diastolic heart failure  . Coronary artery disease    a. Moderate to severe coronary disease involving the left anterior descending artery and right coronary artery.  Sequential stenosis in the LAD is significant.  Right coronary artery is moderate to severely likely nonischemic. Questionable small LVOT obstruction.  No Brockenbrough maneuver was performed.  Catheterization January 2013; b. 05/2014 MV no isch/infarct, EF 60%.  . Depressive disorder, not elsewhere classified   . Diverticulitis   . Erosive esophagitis 08/04/2015  . Esophageal reflux   . Gastric volvulus 08/04/2015  . Gastritis   . H/O hiatal hernia   . Hypertension   . IBS (irritable bowel syndrome)   . Infection of esophagostomy ([place])   . Macular degeneration   . Melanoma ([place])    removal on back   . Pelvic abscess in female   . Perforated bowel ([place])   .  Perforated sigmoid colon ([place])   . PONV (postoperative nausea and vomiting)   . Pulmonary hypertension    PA systolic pressure [id number] mmHg by echocardiogram, PA pressure 33/10 by cardiac catheterization PA saturation 62% thermodilution cardiac index 2.0 thick cardiac index 2.4  . Pure hypercholesterolemia   . Tricuspid regurgitation    moderate tricuspid regurgitation by echocardiogram, prior use of anorexic agents    Patient Active Problem List   Diagnosis Date Noted  . Discitis of lumbar region L5- S1 03/01/2016  . Epidural abscess L5- S1 03/01/2016  . Back pain, acute 03/01/2016  . Chronic diastolic CHF (congestive heart failure) ([place]) 03/01/2016  . DDD (degenerative disc disease), lumbar 02/12/2016  . History of GI bleed 12/18/2015  . Gastric volvulus 08/29/2015  . Erosive esophagitis 08/29/2015  . Atrial fibrillation with RVR ([place]) 08/09/2015  . Irreducible hiatal hernia   . TIA (transient ischemic attack)   . Gastric outlet obstruction   . Acute diastolic (congestive) heart failure 06/17/2015  . Pressure ulcer 06/17/2015  . Acute diastolic CHF (congestive heart failure) ([place]) 06/17/2015  . Essential hypertension   . Colonic mass 05/02/2015  . Anemia due to chronic blood loss 05/02/2015  . PAF (paroxysmal atrial fibrillation) ([place]) 05/02/2015  . Anxiety and depression 05/02/2015  . CKD (chronic kidney disease) stage 3, GFR 30-59 ml/min 03/03/2015  . History of percutaneous coronary intervention   . Coronary artery disease   .  HTN (hypertension) 02/05/2011  . HYPERCHOLESTEROLEMIA 07/16/2007    Past Surgical History:  Procedure Laterality Date  . CARDIAC CATHETERIZATION Left 03/05/2015   Procedure: CENTRAL LINE INSERTION;  Surgeon: Aviva Signs, MD;  Location: [facility];  Service: General;  Laterality: Left;  . CATARACT EXTRACTION W/ INTRAOCULAR LENS  IMPLANT, BILATERAL  ~ 2002  . COLECTOMY WITH COLOSTOMY CREATION/HARTMANN PROCEDURE N/A 03/05/2015   Procedure: PARTIAL  COLECTOMY WITH COLOSTOMY CREATION/HARTMANN PROCEDURE;  Surgeon: Aviva Signs, MD;  Location: [facility];  Service: General;  Laterality: N/A;  . CORONARY ANGIOPLASTY WITH STENT PLACEMENT  05/08/11   "1"  . ESOPHAGEAL MANOMETRY N/A 06/06/2014   Procedure: ESOPHAGEAL MANOMETRY (EM);  Surgeon: Jerene Bears, MD;  [facility];  Service: Gastroenterology;  Laterality: N/A;  . ESOPHAGOGASTRODUODENOSCOPY (EGD) WITH PROPOFOL N/A 08/04/2015   Procedure: ESOPHAGOGASTRODUODENOSCOPY (EGD) WITH PROPOFOL;  Surgeon: Mauri Pole, MD;  [facility];  Service: Endoscopy;  Laterality: N/A;  . HERNIA REPAIR    . IR GENERIC HISTORICAL  03/04/2016   IR LUMBAR DISC ASPIRATION W/IMG GUIDE 03/04/2016 Luanne Bras, MD [facility]  . LAPAROSCOPIC NISSEN FUNDOPLICATION N/A 123456   Procedure: LAPAROSCOPIC  LYSIS OF ADHESIONS, SEGMENTAL GASTRECTOMY, PARTIAL REDUCTION OF HERNIA;  Surgeon: [name], MD;  [facility];  Service: General;  Laterality: N/A;  . NISSEN FUNDOPLICATION  [id number]  . PERCUTANEOUS CORONARY STENT INTERVENTION (PCI-S) N/A 05/08/2011   Procedure: PERCUTANEOUS CORONARY STENT INTERVENTION (PCI-S);  Surgeon: Wellington Hampshire, MD;  Location: [facility] CATH LAB;  Service: Cardiovascular;  Laterality: N/A;  . ROTATOR CUFF REPAIR  ~ 2001   right  . SHOULDER ARTHROSCOPY  ~ 2004; 2005   left; "joint's wore out"  . TONSILLECTOMY  ~ 1944  . TUBAL LIGATION  1972    OB History    No data available       Home Medications    Prior to Admission medications   Medication Sig Start Date End Date Taking? Authorizing Provider  albuterol (PROVENTIL HFA;VENTOLIN HFA) 108 (90 Base) MCG/ACT inhaler Inhale 2 puffs into the lungs every 6 (six) hours as needed for wheezing or shortness of breath.     Historical Provider, MD  ALPRAZolam Duanne Moron) 0.25 MG tablet TAKE 1/2 TO 1 TABLET 3 TIMES DAILY AS NEEDED 04/26/16   Terald Sleeper, PA-C  citalopram (CELEXA) 40 MG tablet Take 20 mg by mouth at bedtime.      Historical Provider, MD  esomeprazole (NEXIUM) 40 MG capsule Take 40 mg by mouth daily before breakfast.     Historical Provider, MD  ferrous sulfate 325 (65 FE) MG EC tablet Take 325 mg by mouth daily.     Historical Provider, MD  furosemide (LASIX) 20 MG tablet Take 1 tablet (20 mg total) by mouth daily. 03/11/16   Terald Sleeper, PA-C  hydrALAZINE (APRESOLINE) 50 MG tablet Take 1 tablet (50 mg total) by mouth 3 (three) times daily. 02/12/16   Terald Sleeper, PA-C  isosorbide mononitrate (IMDUR) 30 MG 24 hr tablet Take 30 mg by mouth daily. 07/21/15   Historical Provider, MD  loratadine (CLARITIN) 10 MG tablet Take 10 mg by mouth daily.    Historical Provider, MD  losartan (COZAAR) 100 MG tablet Take 100 mg by mouth daily. 02/27/16   Historical Provider, MD  metoprolol succinate (TOPROL-XL) 25 MG 24 hr tablet Take 1 tablet (25 mg total) by mouth daily. 08/29/15   Rexene Alberts, MD  Multiple Vitamins-Minerals (PRESERVISION AREDS) TABS Take 1 tablet by mouth  2 (two) times daily.     Historical Provider, MD  nitrofurantoin, macrocrystal-monohydrate, (MACROBID) 100 MG capsule Take 1 capsule (100 mg total) by mouth 2 (two) times daily. 1 po BId 05/13/16   Terald Sleeper, PA-C  nitroGLYCERIN (NITROSTAT) 0.4 MG SL tablet Place 0.4 mg under the tongue every 5 (five) minutes as needed for chest pain. Reported on 04/26/2015    Historical Provider, MD  polyethylene glycol powder (GLYCOLAX/MIRALAX) powder MIX 17 GRAMS IN AT LEAST 8OZ OF WATER/JUICE AND DRINK ONCE OR TWICE DAILY 08/29/15   Rexene Alberts, MD  potassium chloride (K-DUR) 10 MEQ tablet Take 1 tablet (10 mEq total) by mouth daily. 03/11/16   Terald Sleeper, PA-C  rosuvastatin (CRESTOR) 10 MG tablet Take 10 mg by mouth at bedtime.     Historical Provider, MD  traZODone (DESYREL) 100 MG tablet Take 100 mg by mouth at bedtime.     Historical Provider, MD  Vitamin D, Ergocalciferol, (DRISDOL) 50000 units CAPS capsule TAKE 1 CAPSULE EVERY WEEK 04/26/16   Terald Sleeper, PA-C  zolpidem (AMBIEN) 10 MG tablet TAKE ONE (1) TABLET AT BEDTIME 05/01/16   Terald Sleeper, PA-C    Family History Family History  Problem Relation Age of Onset  . Heart attack Father   . Hypertension Father   . Diabetes Mother   . Hypertension Mother   . Skin cancer Brother     melanoma  . Colon cancer Neg Hx   . Esophageal cancer Neg Hx   . Rectal cancer Neg Hx   . Stomach cancer Neg Hx     Social History Social History  Substance Use Topics  . Smoking status: Former Smoker    Packs/day: 1.00    Years: 20.00    Types: Cigarettes    Start date: 10/12/1958    Quit date: 04/26/1988  . Smokeless tobacco: Never Used  . Alcohol use No     Allergies   Sulfamethoxazole   Review of Systems Review of Systems  Constitutional: Negative for appetite change and fatigue.  HENT: Negative for congestion, ear discharge and sinus pressure.   Eyes: Negative for discharge.  Respiratory: Negative for cough.   Cardiovascular: Negative for chest pain.  Gastrointestinal: Positive for abdominal pain and anorexia. Negative for diarrhea.  Genitourinary: Negative for frequency and hematuria.  Musculoskeletal: Negative for back pain.  Skin: Negative for rash.  Neurological: Negative for seizures and headaches.  Psychiatric/Behavioral: Negative for hallucinations.     Physical Exam Updated Vital Signs BP 160/68 (BP Location: Right Arm)   Pulse 67   Temp 97.8 F (36.6 C) (Oral)   Resp 18   Ht 5\' 2"  (1.575 m)   Wt 130 lb (59 kg)   SpO2 96%   BMI 23.78 kg/m   Physical Exam  Constitutional: She is oriented to person, place, and time. She appears well-developed.  HENT:  Head: Normocephalic.  Eyes: Conjunctivae and EOM are normal. No scleral icterus.  Neck: Neck supple. No thyromegaly present.  Cardiovascular: Normal rate and regular rhythm.  Exam reveals no gallop and no friction rub.   No murmur heard. Pulmonary/Chest: No stridor. She has no wheezes. She has no rales.  She exhibits no tenderness.  Abdominal: She exhibits no distension. There is tenderness. There is no rebound.  Mild tenderness throughout abdomen  Musculoskeletal: Normal range of motion. She exhibits no edema.  Lymphadenopathy:    She has no cervical adenopathy.  Neurological: She is oriented to person, place, and time.  She exhibits normal muscle tone. Coordination normal.  Skin: No rash noted. No erythema.  Psychiatric: She has a normal mood and affect. Her behavior is normal.     ED Treatments / Results  Labs (all labs ordered are listed, but only abnormal results are displayed) Labs Reviewed  URINALYSIS, ROUTINE W REFLEX MICROSCOPIC  CBC WITH DIFFERENTIAL/PLATELET  COMPREHENSIVE METABOLIC PANEL  LIPASE, BLOOD    EKG  EKG Interpretation None       Radiology No results found.  Procedures Procedures (including critical care time)  Medications Ordered in ED Medications  ondansetron (ZOFRAN) injection 4 mg (not administered)  sodium chloride 0.9 % bolus 500 mL (not administered)     Initial Impression / Assessment and Plan / ED Course  I have reviewed the triage vital signs and the nursing notes.  Pertinent labs & imaging results that were available during my care of the patient were reviewed by me and considered in my medical decision making (see chart for details).     Ultrasound shows gallstones but no cholecystitis. CT scan does not show any acute findings. Patient is given some pain medicine and referred to GI  Final Clinical Impressions(s) / ED Diagnoses   Final diagnoses:  None    New Prescriptions New Prescriptions   No medications on file     Milton Ferguson, MD 05/31/16 2146

## 2016-05-31 NOTE — ED Notes (Signed)
Pt transported to US

## 2016-05-31 NOTE — Discharge Instructions (Signed)
Follow-up with your stomach doctor next week for follow-up with Dr. Oneida Alar next week. He can return to the emergency department if getting worse before then

## 2016-05-31 NOTE — ED Notes (Signed)
Pt transported to CT ?

## 2016-05-31 NOTE — ED Triage Notes (Addendum)
Pt c/o RUQ abdominal pain worse after eating that radiates to her back at times x2 months. PT states no change in her bowel movements and she has a colostomy bag. PT denies any urinary symptoms but states had antibiotics x2 weeks ago for UTI.

## 2016-06-03 NOTE — Telephone Encounter (Signed)
Refill called to [facility] 

## 2016-06-10 ENCOUNTER — Encounter: Payer: Medicare Other | Admitting: *Deleted

## 2016-06-12 ENCOUNTER — Encounter: Payer: Self-pay | Admitting: Gastroenterology

## 2016-06-12 ENCOUNTER — Ambulatory Visit (INDEPENDENT_AMBULATORY_CARE_PROVIDER_SITE_OTHER): Payer: Medicare Other | Admitting: Gastroenterology

## 2016-06-12 VITALS — Wt 131.0 lb

## 2016-06-12 DIAGNOSIS — K8020 Calculus of gallbladder without cholecystitis without obstruction: Secondary | ICD-10-CM

## 2016-06-12 DIAGNOSIS — R1011 Right upper quadrant pain: Secondary | ICD-10-CM | POA: Diagnosis not present

## 2016-06-12 DIAGNOSIS — R112 Nausea with vomiting, unspecified: Secondary | ICD-10-CM | POA: Diagnosis not present

## 2016-06-12 NOTE — Patient Instructions (Signed)
[facility] will call you to schedule an appointment.

## 2016-06-12 NOTE — Progress Notes (Addendum)
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 Hilarie Fredrickson.  She was seen at [facility] in 08/2015 for colonoscopy during which a fungating nonobstructing large mass was found in the proximal ascending colon which measured 5 cm in length, this was resected with EMR and had low-grade dysplasia in 08/2015.  Was supposed to have repeat colonoscopy in 6 months.  Does not want to have to keep traveling to [facility] so she has not proceeded with that.  She also had an EGD in June 2017 at [facility] which was normal other than her large hiatal hernia.  She presents to our office today with complaints of right upper quadrant abdominal pain that radiates up into the right side of her chest and around to the right side of her back along with nausea and intermittent vomiting at nighttime. She was seen in the emergency department at [facility] earlier this month where she underwent an ultrasound of her abdomen that showed cholelithiasis. She also had a CT scan of the abdomen and pelvis, which showed the following:  IMPRESSION: 1. Descending colostomy with new small bowel containing parastomal hernia. Borderline proximal small bowel dilatation for which low-grade partial small bowel obstruction cannot be excluded. 2. Hartmann's pouch. Soft tissue thickening within the left hemipelvis could be related to the prior drainpositioned in this area, as well as adjacent non-opacified small bowel loops. If acute pathology in this area (i.e. the fistula between Hartmann's pouch and small  bowel) is a concern, repeat CT would be necessary after better enteric opacification and possibly rectal contrast. 3. New 4 mm stone in the proximal left ureter, without significant obstruction. 4. Large hiatal hernia with decrease in right pleural fluid. Improved to resolved distension of the herniated stomach. 5.  Coronary artery atherosclerosis. Aortic atherosclerosis. 6. 10 mm cystic lesion the pancreas is likely a pseudocyst. Indolent cystic neoplasm less likely. Pancreatic protocol CT should be considered and 2 years, per consensus criteria. This recommendation follows ACR consensus guidelines: Management of Incidental Pancreatic Cysts: A White Paper of the ACR Incidental Findings Committee. [place] 4967;59:[id number].  She is convinced that her symptoms are from her gallbladder and she wants to consider having this removed. She states that her symptoms have been making her feel terrible and preventing her from participating in her activities in her grandchildren's lives.  She says that her ostomy output is normal, unchanged.   Past Medical History:  Diagnosis Date  . Anemia   . Anxiety state, unspecified   . Atrial fibrillation with RVR ([place])   . [name]'s esophagus   . Bilateral sciatica   . Cellulitis of extremity   . Chronic back pain   . Congestive heart failure, unspecified    diastolic heart failure  . Coronary artery disease    a. Moderate to severe coronary disease involving the left anterior descending artery and right coronary artery.  Sequential stenosis in the LAD is significant.  Right coronary artery is moderate to severely likely nonischemic. Questionable small LVOT obstruction.  No Brockenbrough maneuver was performed.  Catheterization January 2013; b. 05/2014 MV no  isch/infarct, EF 60%.  . Depressive disorder, not elsewhere classified   . Diverticulitis   . Erosive esophagitis 08/04/2015  . Esophageal reflux   . Gastric volvulus 08/04/2015  . Gastritis     . H/O hiatal hernia   . Hypertension   . IBS (irritable bowel syndrome)   . Infection of esophagostomy ([place])   . Macular degeneration   . Melanoma ([place])    removal on back   . Pelvic abscess in female   . Perforated bowel ([place])   . Perforated sigmoid colon ([place])   . PONV (postoperative nausea and vomiting)   . Pulmonary hypertension    PA systolic pressure 50-93 mmHg by echocardiogram, PA pressure 33/10 by cardiac catheterization PA saturation 62% thermodilution cardiac index 2.0 thick cardiac index 2.4  . Pure hypercholesterolemia   . Tricuspid regurgitation    moderate tricuspid regurgitation by echocardiogram, prior use of anorexic agents   Past Surgical History:  Procedure Laterality Date  . CARDIAC CATHETERIZATION Left 03/05/2015   Procedure: CENTRAL LINE INSERTION;  Surgeon: Aviva Signs, MD;  Location: [facility];  Service: General;  Laterality: Left;  . CATARACT EXTRACTION W/ INTRAOCULAR LENS  IMPLANT, BILATERAL  ~ 2002  . COLECTOMY WITH COLOSTOMY CREATION/HARTMANN PROCEDURE N/A 03/05/2015   Procedure: PARTIAL COLECTOMY WITH COLOSTOMY CREATION/HARTMANN PROCEDURE;  Surgeon: Aviva Signs, MD;  Location: [facility];  Service: General;  Laterality: N/A;  . CORONARY ANGIOPLASTY WITH STENT PLACEMENT  05/08/11   "1"  . ESOPHAGEAL MANOMETRY N/A 06/06/2014   Procedure: ESOPHAGEAL MANOMETRY (EM);  Surgeon: Jerene Bears, MD;  Location: [facility];  Service: Gastroenterology;  Laterality: N/A;  . ESOPHAGOGASTRODUODENOSCOPY (EGD) WITH PROPOFOL N/A 08/04/2015   Procedure: ESOPHAGOGASTRODUODENOSCOPY (EGD) WITH PROPOFOL;  Surgeon: Mauri Pole, MD;  Location: [facility];  Service: Endoscopy;  Laterality: N/A;  . HERNIA REPAIR    . IR GENERIC HISTORICAL  03/04/2016   IR LUMBAR DISC ASPIRATION W/IMG GUIDE 03/04/2016 Luanne Bras, MD [facility]-INTER[facility] RAD  . LAPAROSCOPIC NISSEN FUNDOPLICATION N/A 2/67/1245   Procedure: LAPAROSCOPIC  LYSIS OF ADHESIONS, SEGMENTAL GASTRECTOMY, PARTIAL REDUCTION OF  HERNIA;  Surgeon: [name], MD;  Location: [facility];  Service: General;  Laterality: N/A;  . NISSEN FUNDOPLICATION  8099  . PERCUTANEOUS CORONARY STENT INTERVENTION (PCI-S) N/A 05/08/2011   Procedure: PERCUTANEOUS CORONARY STENT INTERVENTION (PCI-S);  Surgeon: Wellington Hampshire, MD;  Location: [facility] CATH LAB;  Service: Cardiovascular;  Laterality: N/A;  . ROTATOR CUFF REPAIR  ~ 2001   right  . SHOULDER ARTHROSCOPY  ~ 2004; 2005   left; "joint's wore out"  . TONSILLECTOMY  ~ 1944  . TUBAL LIGATION  1972    reports that she quit smoking about 28 years ago. Her smoking use included Cigarettes. She started smoking about 57 years ago. She has a 20.00 pack-year smoking history. She has never used smokeless tobacco. She reports that she does not drink alcohol or use drugs. family history includes Diabetes in her mother; Heart attack in her father; Hypertension in her father and mother; Skin cancer in her brother. Allergies  Allergen Reactions  . Sulfamethoxazole Rash      Outpatient Encounter Prescriptions as of 06/12/2016  Medication Sig  . albuterol (PROVENTIL HFA;VENTOLIN HFA) 108 (90 Base) MCG/ACT inhaler Inhale 2 puffs into the lungs every 6 (six) hours as needed for wheezing or shortness of breath.   . ALPRAZolam (XANAX) 0.25 MG tablet TAKE 1/2 TO 1 TABLET 3 TIMES DAILY AS NEEDED  . citalopram (CELEXA) 40 MG tablet Take 20  mg by mouth at bedtime.   Marland Kitchen esomeprazole (NEXIUM) 40 MG capsule Take 40 mg by mouth daily before breakfast.   . ferrous sulfate 325 (65 FE) MG EC tablet Take 325 mg by mouth daily.   . furosemide (LASIX) 20 MG tablet Take 1 tablet (20 mg total) by mouth daily.  . hydrALAZINE (APRESOLINE) 50 MG tablet Take 1 tablet (50 mg total) by mouth 3 (three) times daily.  . isosorbide mononitrate (IMDUR) 30 MG 24 hr tablet Take 30 mg by mouth daily.  Marland Kitchen loratadine (CLARITIN) 10 MG tablet Take 10 mg by mouth daily.  Marland Kitchen losartan (COZAAR) 100 MG tablet Take 100 mg by mouth daily.  .  metoprolol succinate (TOPROL-XL) 25 MG 24 hr tablet Take 1 tablet (25 mg total) by mouth daily.  . Multiple Vitamins-Minerals (PRESERVISION AREDS) TABS Take 1 tablet by mouth 2 (two) times daily.   . nitroGLYCERIN (NITROSTAT) 0.4 MG SL tablet Place 0.4 mg under the tongue every 5 (five) minutes as needed for chest pain. Reported on 04/26/2015  . polyethylene glycol powder (GLYCOLAX/MIRALAX) powder MIX 17 GRAMS IN AT LEAST 8OZ OF WATER/JUICE AND DRINK ONCE OR TWICE DAILY (Patient taking differently: Take 17 g by mouth daily. [place] OF WATER/JUICE AND DRINK)  . potassium chloride (K-DUR) 10 MEQ tablet Take 1 tablet (10 mEq total) by mouth daily.  . rosuvastatin (CRESTOR) 10 MG tablet Take 10 mg by mouth at bedtime.   . traZODone (DESYREL) 100 MG tablet TAKE ONE TABLET DAILY AT BEDTIME  . Vitamin D, Ergocalciferol, (DRISDOL) 50000 units CAPS capsule TAKE 1 CAPSULE EVERY WEEK (Patient taking differently: TAKE 1 CAPSULE EVERY WEEK ON TUESDAYS)  . zolpidem (AMBIEN) 10 MG tablet TAKE ONE (1) TABLET AT BEDTIME  . [DISCONTINUED] HYDROcodone-acetaminophen (NORCO/VICODIN) 5-325 MG tablet Take 1 tablet by mouth every 6 (six) hours as needed for moderate pain.  . [DISCONTINUED] nitrofurantoin, macrocrystal-monohydrate, (MACROBID) 100 MG capsule Take 1 capsule (100 mg total) by mouth 2 (two) times daily. 1 po BId   No facility-administered encounter medications on file as of 06/12/2016.      REVIEW OF SYSTEMS  : All other systems reviewed and negative except where noted in the History of Present Illness.   PHYSICAL EXAM: Wt 131 lb (59.4 kg)   BMI 23.96 kg/m  General: Well developed white female in no acute distress Head: Normocephalic and atraumatic Eyes:  Sclerae anicteric, conjunctiva pink. Ears: Normal auditory acuity Lungs: Clear throughout to auscultation; no increased WOB. Heart: Regular rate and rhythm Abdomen: Soft, non-distended.  Normal bowel sounds.  Ostomy noted on the  left but bag not taken off to examine thoroughly.  Previous scars noted.  TTP in RUQ and epigastrium. Musculoskeletal: Symmetrical with no gross deformities  Skin: No lesions on visible extremities Extremities: No edema  Neurological: Alert oriented x 4, grossly non-focal Psychological:  Alert and cooperative. Normal mood and affect  ASSESSMENT AND PLAN: -RUQ/epigastric abdominal pain with nausea:  Has gallstones on imaging.  ? Symptomatic cholelithiasis (patient thinks that is the cause) vs symptoms due to her large hiatal hernia.  Either way this is a surgical issue.  Will refer back to Dr. Zella Richer as she does not want to continue to go to [facility] due to the commute. -Fungating nonobstructing large mass was found in the proximal ascending colon which measured 5 cm in length, this was resected with EMR at [facility] and had low-grade dysplasia in 08/2015.  Was supposed to  have repeat colonoscopy in 6 months.  Does not want to have to keep traveling to [facility].    CC:  Terald Sleeper, PA-C   Addendum: Reviewed and agree with management. Repeat colonoscopy at [facility] after incomplete EMR at [facility] previously is strongly recommended Jerene Bears, MD

## 2016-06-13 ENCOUNTER — Other Ambulatory Visit: Payer: Self-pay | Admitting: Emergency Medicine

## 2016-06-13 ENCOUNTER — Telehealth: Payer: Self-pay | Admitting: Emergency Medicine

## 2016-06-13 DIAGNOSIS — R1011 Right upper quadrant pain: Secondary | ICD-10-CM

## 2016-06-13 DIAGNOSIS — K8020 Calculus of gallbladder without cholecystitis without obstruction: Secondary | ICD-10-CM

## 2016-06-13 NOTE — Telephone Encounter (Signed)
Received fax from [facility]: appointment with Dr. Zella Richer on 06-25-16 at 4:15 pm.

## 2016-06-27 ENCOUNTER — Other Ambulatory Visit: Payer: Self-pay | Admitting: Physician Assistant

## 2016-07-04 ENCOUNTER — Ambulatory Visit (INDEPENDENT_AMBULATORY_CARE_PROVIDER_SITE_OTHER): Payer: Medicare Other | Admitting: Cardiovascular Disease

## 2016-07-04 ENCOUNTER — Encounter: Payer: Self-pay | Admitting: Cardiovascular Disease

## 2016-07-04 VITALS — BP 138/62 | HR 58 | Ht 62.0 in | Wt 133.0 lb

## 2016-07-04 DIAGNOSIS — I2510 Atherosclerotic heart disease of native coronary artery without angina pectoris: Secondary | ICD-10-CM | POA: Diagnosis not present

## 2016-07-04 DIAGNOSIS — I5032 Chronic diastolic (congestive) heart failure: Secondary | ICD-10-CM

## 2016-07-04 DIAGNOSIS — Z9889 Other specified postprocedural states: Secondary | ICD-10-CM | POA: Diagnosis not present

## 2016-07-04 DIAGNOSIS — I10 Essential (primary) hypertension: Secondary | ICD-10-CM | POA: Diagnosis not present

## 2016-07-04 DIAGNOSIS — I480 Paroxysmal atrial fibrillation: Secondary | ICD-10-CM

## 2016-07-04 DIAGNOSIS — R001 Bradycardia, unspecified: Secondary | ICD-10-CM

## 2016-07-04 DIAGNOSIS — Z9861 Coronary angioplasty status: Secondary | ICD-10-CM

## 2016-07-04 NOTE — Patient Instructions (Signed)

## 2016-07-04 NOTE — Progress Notes (Addendum)
SUBJECTIVE: The patient presents for follow up of coronary artery disease, paroxysmal atrial fibrillation, chronic diastolic heart failure.  With respect to coronary artery disease, she is doing well and denies exertional chest pain. With respect to atrial fibrillation, she denies palpitations. With respect to chronic diastolic heart failure, she denies shortness of breath and leg swelling.  She is planning to undergo gallbladder surgery later this year.  ECG performed in the office today which I ordered and personally interpreted demonstrates sinus bradycardia with PVC's.   Review of Systems: As per "subjective", otherwise negative.  Allergies  Allergen Reactions  . Sulfamethoxazole Rash    Current Outpatient Prescriptions  Medication Sig Dispense Refill  . albuterol (PROVENTIL HFA;VENTOLIN HFA) 108 (90 Base) MCG/ACT inhaler Inhale 2 puffs into the lungs every 6 (six) hours as needed for wheezing or shortness of breath.     . ALPRAZolam (XANAX) 0.25 MG tablet TAKE 1/2 TO 1 TABLET 3 TIMES DAILY AS NEEDED 90 tablet 1  . citalopram (CELEXA) 40 MG tablet Take 20 mg by mouth at bedtime.     Marland Kitchen esomeprazole (NEXIUM) 40 MG capsule Take 40 mg by mouth daily before breakfast.     . ferrous sulfate 325 (65 FE) MG EC tablet Take 325 mg by mouth daily.     . furosemide (LASIX) 20 MG tablet Take 1 tablet (20 mg total) by mouth daily. 30 tablet 11  . hydrALAZINE (APRESOLINE) 50 MG tablet Take 1 tablet (50 mg total) by mouth 3 (three) times daily. 90 tablet 1  . isosorbide mononitrate (IMDUR) 30 MG 24 hr tablet Take 30 mg by mouth daily.    Marland Kitchen loratadine (CLARITIN) 10 MG tablet Take 10 mg by mouth daily.    Marland Kitchen losartan (COZAAR) 100 MG tablet Take 100 mg by mouth daily.    . metoprolol succinate (TOPROL-XL) 25 MG 24 hr tablet Take 1 tablet (25 mg total) by mouth daily.    . Multiple Vitamins-Minerals (PRESERVISION AREDS) TABS Take 1 tablet by mouth 2 (two) times daily.     . nitroGLYCERIN  (NITROSTAT) 0.4 MG SL tablet Place 0.4 mg under the tongue every 5 (five) minutes as needed for chest pain. Reported on 04/26/2015    . polyethylene glycol powder (GLYCOLAX/MIRALAX) powder MIX 17 GRAMS IN AT LEAST 8OZ OF WATER/JUICE AND DRINK ONCE OR TWICE DAILY (Patient taking differently: Take 17 g by mouth daily. [place] OF WATER/JUICE AND DRINK)    . potassium chloride (K-DUR) 10 MEQ tablet Take 1 tablet (10 mEq total) by mouth daily. 30 tablet 11  . rosuvastatin (CRESTOR) 10 MG tablet Take 10 mg by mouth at bedtime.     . traZODone (DESYREL) 100 MG tablet TAKE ONE TABLET DAILY AT BEDTIME 30 tablet 2  . Vitamin D, Ergocalciferol, (DRISDOL) 50000 units CAPS capsule TAKE 1 CAPSULE EVERY WEEK (Patient taking differently: TAKE 1 CAPSULE EVERY WEEK ON TUESDAYS) 4 capsule 11  . zolpidem (AMBIEN) 10 MG tablet TAKE ONE (1) TABLET AT BEDTIME 30 tablet 2   No current facility-administered medications for this visit.     Past Medical History:  Diagnosis Date  . Anemia   . Anxiety state, unspecified   . Atrial fibrillation with RVR ([place])   . [name]'s esophagus   . Bilateral sciatica   . Cellulitis of extremity   . Chronic back pain   . Congestive heart failure, unspecified    diastolic heart failure  . Coronary artery  disease    a. Moderate to severe coronary disease involving the left anterior descending artery and right coronary artery.  Sequential stenosis in the LAD is significant.  Right coronary artery is moderate to severely likely nonischemic. Questionable small LVOT obstruction.  No Brockenbrough maneuver was performed.  Catheterization January 2013; b. 05/2014 MV no isch/infarct, EF 60%.  . Depressive disorder, not elsewhere classified   . Diverticulitis   . Erosive esophagitis 08/04/2015  . Esophageal reflux   . Gastric volvulus 08/04/2015  . Gastritis   . H/O hiatal hernia   . Hypertension   . IBS (irritable bowel syndrome)   . Infection of esophagostomy ([place])     . Macular degeneration   . Melanoma ([place])    removal on back   . Pelvic abscess in female   . Perforated bowel ([place])   . Perforated sigmoid colon ([place])   . PONV (postoperative nausea and vomiting)   . Pulmonary hypertension    PA systolic pressure 38-10 mmHg by echocardiogram, PA pressure 33/10 by cardiac catheterization PA saturation 62% thermodilution cardiac index 2.0 thick cardiac index 2.4  . Pure hypercholesterolemia   . Tricuspid regurgitation    moderate tricuspid regurgitation by echocardiogram, prior use of anorexic agents    Past Surgical History:  Procedure Laterality Date  . CARDIAC CATHETERIZATION Left 03/05/2015   Procedure: CENTRAL LINE INSERTION;  Surgeon: Aviva Signs, MD;  Location: [facility];  Service: General;  Laterality: Left;  . CATARACT EXTRACTION W/ INTRAOCULAR LENS  IMPLANT, BILATERAL  ~ 2002  . COLECTOMY WITH COLOSTOMY CREATION/HARTMANN PROCEDURE N/A 03/05/2015   Procedure: PARTIAL COLECTOMY WITH COLOSTOMY CREATION/HARTMANN PROCEDURE;  Surgeon: Aviva Signs, MD;  Location: [facility];  Service: General;  Laterality: N/A;  . CORONARY ANGIOPLASTY WITH STENT PLACEMENT  05/08/11   "1"  . ESOPHAGEAL MANOMETRY N/A 06/06/2014   Procedure: ESOPHAGEAL MANOMETRY (EM);  Surgeon: Jerene Bears, MD;  [facility];  Service: Gastroenterology;  Laterality: N/A;  . ESOPHAGOGASTRODUODENOSCOPY (EGD) WITH PROPOFOL N/A 08/04/2015   Procedure: ESOPHAGOGASTRODUODENOSCOPY (EGD) WITH PROPOFOL;  Surgeon: Mauri Pole, MD;  Location: [facility];  Service: Endoscopy;  Laterality: N/A;  . HERNIA REPAIR    . IR GENERIC HISTORICAL  03/04/2016   IR LUMBAR DISC ASPIRATION W/IMG GUIDE 03/04/2016 Luanne Bras, MD [facility]INTER[facility] RAD  . LAPAROSCOPIC NISSEN FUNDOPLICATION N/A 1/75/1025   Procedure: LAPAROSCOPIC  LYSIS OF ADHESIONS, SEGMENTAL GASTRECTOMY, PARTIAL REDUCTION OF HERNIA;  Surgeon: [name], MD;  Location: [facility];  Service: General;  Laterality: N/A;  . NISSEN  FUNDOPLICATION  8527  . PERCUTANEOUS CORONARY STENT INTERVENTION (PCI-S) N/A 05/08/2011   Procedure: PERCUTANEOUS CORONARY STENT INTERVENTION (PCI-S);  Surgeon: Wellington Hampshire, MD;  Location: [facility] CATH LAB;  Service: Cardiovascular;  Laterality: N/A;  . ROTATOR CUFF REPAIR  ~ 2001   right  . SHOULDER ARTHROSCOPY  ~ 2004; 2005   left; "joint's wore out"  . TONSILLECTOMY  ~ 1944  . TUBAL LIGATION  1972    Social History   Social History  . Marital status: Divorced    Spouse name: N/A  . Number of children: 3  . Years of education: N/A   Occupational History  . Retired    Social History Main Topics  . Smoking status: Former Smoker    Packs/day: 1.00    Years: 20.00    Types: Cigarettes    Start date: 10/12/1958    Quit date: 04/26/1988  . Smokeless tobacco: Never Used  . Alcohol use No  .  Drug use: No  . Sexual activity: No   Other Topics Concern  . Not on file   Social History Narrative   Divorced.  Lives alone.  Ambulates with a walker.      Vitals:   07/04/16 1009  BP: 138/62  Pulse: (!) 58  SpO2: 99%  Weight: 133 lb (60.3 kg)  Height: 5\' 2"  (1.575 m)    PHYSICAL EXAM General: NAD HEENT: Normal. Neck: No JVD, no thyromegaly. Lungs: Clear to auscultation bilaterally with normal respiratory effort. CV: Nondisplaced PMI.  Regular rate and rhythm, normal S1/S2, no S3/S4, no murmur. No pretibial or periankle edema.  No carotid bruit.   Abdomen: Soft, nontender, no distention.  Neurologic: Alert and oriented.  Psych: Normal affect. Skin: Normal. Musculoskeletal: No gross deformities.    ECG: Most recent ECG reviewed.      ASSESSMENT AND PLAN: 1. Coronary artery disease with LAD stent: Symptomatically stable. Normal nuclear stress test in February 2016. She is not on aspirin due to high bleeding risk and history of anemia. I will continue long-acting nitrates, metoprolol succinate, and Crestor.  2. Hypertension: Controlled. No changes.  3. Symptomatic  bradycardia/paroxysmal atrial fibrillation: Stable on present dose of metoprolol. She is not on anticoagulation due to being a high bleeding risk, falls risk, and history of anemia. She was evaluated by Dr. Rayann Heman in the past and he felt she was not a candidate for the Watchman device although it could be considered in the future.  4. Chronic diastolic failure: Euvolemic. No changes to Lasix dose.  5. Preoperative risk stratification: She can proceed with laparoscopic cholecystectomy as planned with an acceptable level of risk.   Disposition: Follow-up 6 months   Kate Sable, M.D., F.A.C.C.

## 2016-07-08 ENCOUNTER — Other Ambulatory Visit: Payer: Self-pay | Admitting: Physician Assistant

## 2016-07-08 ENCOUNTER — Telehealth: Payer: Self-pay | Admitting: Physician Assistant

## 2016-07-08 DIAGNOSIS — I10 Essential (primary) hypertension: Secondary | ICD-10-CM

## 2016-07-08 MED ORDER — ZOLPIDEM TARTRATE 10 MG PO TABS: ORAL_TABLET | ORAL | 2 refills | Status: DC

## 2016-07-08 NOTE — Telephone Encounter (Signed)
Ambien Rx called into pharmacy per Particia Nearing, PA

## 2016-07-11 ENCOUNTER — Telehealth: Payer: Self-pay | Admitting: Cardiovascular Disease

## 2016-07-11 ENCOUNTER — Ambulatory Visit: Payer: Self-pay | Admitting: General Surgery

## 2016-07-11 NOTE — H&P (Signed)
[name] 06/25/2016 3:41 PM Location: [facility] Patient #: [id number] DOB: Aug 14, 1934 Divorced / Language: [name] / Race: White Female   History of Present Illness Heather Hollingshead MD; 06/25/2016 5:00 PM) The patient is a 82 year old female.  Note:She is been sent to see [place] today by Dr. Hilarie Fredrickson because of right upper quadrant pain radiating around to the back with nausea and some vomiting and gallstones. She's had known gallstones for over a year. She also has a large recurrent hiatal hernia. She was supposed to have her hiatal hernia repaired in [place] in September 2017 but decided not to have it done. She is also noted to have a large tubulovillous polyp in her ascending colon. She had endoscopic mucosal resection and was supposed to have a follow-up colonoscopy but she decided she did not want to have that done. She says it is too difficult driving back and forth from [facility]. She states the pain she is having in her upper abdomen now that radiates through to her back and is limiting her and she does not want to continue living this way. She also reports some pain around her left-sided colostomy that radiates down into her groin. 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  She is here with her sister. She is due to see her cardiologist, Dr. Bronson Richardson in early Heather.  I have reviewed her extensive past medical history.  Allergies ([name], [place]; 06/25/2016 3:42 PM) Sulfa Antibiotics   Medication History ([name], [place]; 06/25/2016 3:42 PM) Citalopram Hydrobromide (40MG  Tablet, Oral) Active. Aspirin EC (81MG  Tablet DR, Oral) Active. Ferrous Sulfate (325 (65 Fe)MG Tablet, Oral) Active. Xanax (0.25MG  Tablet, Oral) Active. NexIUM (40MG  Capsule DR, Oral) Active. Losartan Potassium (100MG  Tablet, Oral) Active. Systane Balance (0.6% Solution, Ophthalmic as needed) Active. TraZODone HCl (100MG  Tablet, Oral) Active. Vitamin  D3 (5000UNIT Tablet Chewable, Oral) Active. Zolpidem Tartrate (10MG  Tablet, Oral) Active. Amoxicillin-Pot Clavulanate (875-125MG  Tablet, Oral) Active. HydroCHLOROthiazide (25MG  Tablet, Oral) Active. CeleXA (20MG  Tablet, Oral) Active. Guaifenesin (600MG  Tablet ER, Oral) Active. HydrALAZINE HCl (25MG  Tablet, Oral) Active. Metoprolol Succinate ER (25MG  Tablet ER 24HR, Oral) Active. Nitroglycerin (0.4MG  Tab Sublingual, Sublingual) Active. Oxycodone-Acetaminophen (5-325MG  Tablet, Oral) Active. ProAir HFA (108 (90 Base)MCG/ACT Aerosol Soln, Inhalation) Active. Rosuvastatin Calcium (10MG  Tablet, Oral) Active. Albuterol Sulfate ER (8MG  Tablet ER 12HR, Oral) Active. Fosamax (70MG  Tablet, Oral) Active. AmLODIPine Besylate (10MG  Tablet, Oral) Active. Claritin (10MG  Tablet, Oral) Active. Multivitamins (Oral) Active. Medications Reconciled    Review of Systems Heather Hollingshead MD; 06/25/2016 4:53 PM)  Note: General he-she is not eating much.  Abdomen-as per HPI.   Vitals ([name] CMA; 06/25/2016 3:42 PM) 06/25/2016 3:42 PM Weight: 134 lb Height: 62in Body Surface Area: 1.61 m Body Mass Index: 24.51 kg/m  Temp.: 98.61F(Oral)  Pulse: 68 (Regular)  BP: 180/80 (Sitting, Left Arm, Standard)       Physical Exam Heather Hollingshead MD; 06/25/2016 4:55 PM) The physical exam findings are as follows: Note:GENERAL APPEARANCE: Elderly female in NAD. Pleasant and cooperative.  EARS, NOSE, MOUTH THROAT: [place]/AT external ears: no lesions or deformities external nose: no lesions or deformities hearing: grossly normal lips: moist, no deformities EYES external: conjunctiva, lids, sclerae normal pupils: equal, round glasses: yes  CV ascultation: RRR, no murmur extremity edema: no extremity varicosities: no  RESP auscultation: breath sounds equal and clear respiratory effort: normal  GASTROINTESTINAL abdomen: Soft, tender in right upper quadrant but  no guarding, non-distended, no masses, left-sided colostomy  present liver and spleen: not enlarged. hernia: There is a reducible bulge around the left-sided colostomy scar: Multiple scars  SKIN rash or lesion: none No jaundice  NEUROLOGIC speech: normal  PSYCHIATRIC alertness and orientation: normal mood/affect/behavior: Slightly anxious judgement and insight: normal    Assessment & Plan Heather Hollingshead MD; 06/25/2016 4:58 PM) SYMPTOMATIC CHOLELITHIASIS (K80.20) Impression: Her symptoms are highly suggestive of biliary colic. She does have the large recurrent hiatal hernia but has not had symptoms like this before from it. As for her left lower quadrant discomfort, I believe its from the parastomal hernia. She states she does not want to live with the right upper quadrant and back pain. We discussed that indeed the pain could be indeed be from the gallbladder but also could be coming from the recurrent hiatal hernia. Thus, there was not a guarantee that removing the gallbladder would completely remove the pain. She understands this but would like to proceed with cholecystectomy.  Laparoscopic possible open cholecystectomy. We'll wait until after she sees Dr. Bronson Richardson before scheduling the surgery. I have explained the procedure, risks, and aftercare of cholecystectomy. Risks include but are not limited to bleeding, infection, wound problems, anesthesia, diarrhea, bile leak, injury to common bile duct/liver/intestine. She seems to understand and agrees with the plan. Current Plans Schedule for Surgery Pt Education - [name] Given - Laparoscopic Gallbladder Surgery: discussed with patient and provided information. Pt Education - [facility] Free Text Education/Instructions: discussed with patient and provided information. HIATAL HERNIA (K44.9) PARASTOMAL HERNIA WITHOUT OBSTRUCTION OR GANGRENE (K43.5)  Addendum Note(Heather Bocock Adalberto Cole MD; 07/11/2016 3:58 PM) She saw Dr. Bronson Richardson. He  feels she can proceed with cholecystectomy with an acceptable level of risk from a cardiac standpoint. We will start scheduling process.  [name], M.D.

## 2016-07-11 NOTE — Telephone Encounter (Signed)
Patient called stating that Dr Zella Richer has not gotten clearance for her surgery yet.

## 2016-07-11 NOTE — Telephone Encounter (Signed)
Re faxed recent 4/5 office note that cleared pt for surgery - also spoke with [facility] to confirm fax number - pt also made aware

## 2016-07-12 NOTE — Patient Instructions (Addendum)
[name]  07/12/2016   Your procedure is scheduled on: 07/18/2016    Report to [facility] Main  Entrance Take [place]  elevators to 3rd floor to  [place] at    1115 AM.    Call this number if you have problems the morning of surgery [phone]    Remember: ONLY 1 PERSON MAY GO WITH YOU TO SHORT STAY TO GET  READY MORNING OF YOUR SURGERY.  Do not eat food After Midnight. You may have clear liquids from midnight until 715am day or surgery. Nothing by mouth after 715am!!     Take these medicines the morning of surgery with A SIP OF WATER: Xanax if needed, Celexa, Nexium, hydralazine ( Apresoline), Imdur ( Isosorbide mononitrate), Claritin, metoprolol ( Toprol), inhaler if needed (you can bring with you to hospital)                                You may not have any metal on your body including hair pins and              piercings  Do not wear jewelry, make-up, lotions, powders or perfumes, deodorant             Do not wear nail polish.  Do not shave  48 hours prior to surgery.     Do not bring valuables to the hospital. [place].  Contacts, dentures or bridgework may not be worn into surgery.  Leave suitcase in the car. After surgery it may be brought to your room.                    Please read over the following fact sheets you were given: _____________________________________________________________________             [facility] - Preparing for Surgery Before surgery, you can play an important role.  Because skin is not sterile, your skin needs to be as free of germs as possible.  You can reduce the number of germs on your skin by washing with CHG (chlorahexidine gluconate) soap before surgery.  CHG is an antiseptic cleaner which kills germs and bonds with the skin to continue killing germs even after washing. Please DO NOT use if you have an allergy to CHG or antibacterial soaps.  If your  skin becomes reddened/irritated stop using the CHG and inform your nurse when you arrive at Short Stay. Do not shave (including legs and underarms) for at least 48 hours prior to the first CHG shower.  You may shave your face/neck. Please follow these instructions carefully:  1.  Shower with CHG Soap the night before surgery and the  morning of Surgery.  2.  If you choose to wash your hair, wash your hair first as usual with your  normal  shampoo.  3.  After you shampoo, rinse your hair and body thoroughly to remove the  shampoo.                           4.  Use CHG as you would any other liquid soap.  You can apply chg directly  to the skin and wash  Gently with a scrungie or clean washcloth.  5.  Apply the CHG Soap to your body ONLY FROM THE NECK DOWN.   Do not use on face/ open                           Wound or open sores. Avoid contact with eyes, ears mouth and genitals (private parts).                       Wash face,  Genitals (private parts) with your normal soap.             6.  Wash thoroughly, paying special attention to the area where your surgery  will be performed.  7.  Thoroughly rinse your body with warm water from the neck down.  8.  DO NOT shower/wash with your normal soap after using and rinsing off  the CHG Soap.                9.  Pat yourself dry with a clean towel.            10.  Wear clean pajamas.            11.  Place clean sheets on your bed the night of your first shower and do not  sleep with pets. Day of Surgery : Do not apply any lotions/deodorants the morning of surgery.  Please wear clean clothes to the hospital/surgery center.  FAILURE TO FOLLOW THESE INSTRUCTIONS MAY RESULT IN THE CANCELLATION OF YOUR SURGERY PATIENT SIGNATURE_________________________________  NURSE SIGNATURE__________________________________  ________________________________________________________________________     CLEAR LIQUID DIET   Foods Allowed                                                                      Foods Excluded  Coffee and tea, regular and decaf                             liquids that you cannot  Plain Jell-O in any flavor                                             see through such as: Fruit ices (not with fruit pulp)                                     milk, soups, orange juice  Iced Popsicles                                    All solid food Carbonated beverages, regular and diet                                    Cranberry, grape and apple juices Sports drinks like Gatorade Lightly seasoned clear broth or consume(fat free) Sugar, honey syrup  Sample Menu Breakfast                                Lunch                                     Supper Cranberry juice                    Beef broth                            Chicken broth Jell-O                                     Grape juice                           Apple juice Coffee or tea                        Jell-O                                      Popsicle                                                Coffee or tea                        Coffee or tea  _____________________________________________________________________

## 2016-07-12 NOTE — Progress Notes (Signed)
Cardiac clearance in office visit note of 07/04/2016- Dr Bronson Ing, EKG-07/04/16-epic, echo-06/17/15-epic, CXR- 10/31/15-epic

## 2016-07-15 ENCOUNTER — Encounter (HOSPITAL_COMMUNITY): Payer: Self-pay

## 2016-07-15 ENCOUNTER — Encounter (INDEPENDENT_AMBULATORY_CARE_PROVIDER_SITE_OTHER): Payer: Self-pay

## 2016-07-15 ENCOUNTER — Encounter (HOSPITAL_COMMUNITY)
Admission: RE | Admit: 2016-07-15 | Discharge: 2016-07-15 | Disposition: A | Discharge status: Home / Self Care | Payer: Medicare Other | Source: Ambulatory Visit | Attending: General Surgery | Admitting: General Surgery

## 2016-07-15 DIAGNOSIS — K8020 Calculus of gallbladder without cholecystitis without obstruction: Secondary | ICD-10-CM | POA: Diagnosis not present

## 2016-07-15 DIAGNOSIS — Z01812 Encounter for preprocedural laboratory examination: Secondary | ICD-10-CM | POA: Diagnosis not present

## 2016-07-15 LAB — CBC WITH DIFFERENTIAL/PLATELET
Basophils Absolute: 0 10*3/uL (ref 0.0–0.1)
Basophils Relative: 0 %
EOS PCT: 6 %
Eosinophils Absolute: 0.5 10*3/uL (ref 0.0–0.7)
HEMATOCRIT: 29.4 % — AB (ref 36.0–46.0)
Hemoglobin: 9.3 g/dL — ABNORMAL LOW (ref 12.0–15.0)
LYMPHS ABS: 1.4 10*3/uL (ref 0.7–4.0)
LYMPHS PCT: 19 %
MCH: 25.1 pg — AB (ref 26.0–34.0)
MCHC: 31.6 g/dL (ref 30.0–36.0)
MCV: 79.2 fL (ref 78.0–100.0)
MONO ABS: 0.5 10*3/uL (ref 0.1–1.0)
MONOS PCT: 6 %
NEUTROS ABS: 5.1 10*3/uL (ref 1.7–7.7)
Neutrophils Relative %: 69 %
PLATELETS: 267 10*3/uL (ref 150–400)
RBC: 3.71 MIL/uL — ABNORMAL LOW (ref 3.87–5.11)
RDW: 16.4 % — ABNORMAL HIGH (ref 11.5–15.5)
WBC: 7.5 10*3/uL (ref 4.0–10.5)

## 2016-07-15 LAB — COMPREHENSIVE METABOLIC PANEL
ALT: 6 U/L — ABNORMAL LOW (ref 14–54)
AST: 13 U/L — ABNORMAL LOW (ref 15–41)
Albumin: 3.4 g/dL — ABNORMAL LOW (ref 3.5–5.0)
Alkaline Phosphatase: 66 U/L (ref 38–126)
Anion gap: 8 (ref 5–15)
BILIRUBIN TOTAL: 0.6 mg/dL (ref 0.3–1.2)
BUN: 18 mg/dL (ref 6–20)
CALCIUM: 9.1 mg/dL (ref 8.9–10.3)
CO2: 24 mmol/L (ref 22–32)
CREATININE: 1.34 mg/dL — AB (ref 0.44–1.00)
Chloride: 109 mmol/L (ref 101–111)
GFR, EST AFRICAN AMERICAN: 42 mL/min — AB (ref >60)
GFR, EST NON AFRICAN AMERICAN: 37 mL/min — AB (ref >60)
GLUCOSE: 98 mg/dL (ref 65–99)
Potassium: 4.4 mmol/L (ref 3.5–5.1)
Sodium: 141 mmol/L (ref 135–145)
TOTAL PROTEIN: 6.9 g/dL (ref 6.5–8.1)

## 2016-07-15 NOTE — Progress Notes (Signed)
CMP and CBCdiff results faxed via epic to Dr Zella Richer

## 2016-07-18 ENCOUNTER — Ambulatory Visit (HOSPITAL_COMMUNITY): Payer: Medicare Other | Admitting: Registered Nurse

## 2016-07-18 ENCOUNTER — Encounter (HOSPITAL_COMMUNITY)
Admission: RE | Disposition: A | Discharge status: Home / Self Care | Payer: Self-pay | Source: Ambulatory Visit | Attending: General Surgery

## 2016-07-18 ENCOUNTER — Encounter (HOSPITAL_COMMUNITY): Payer: Self-pay | Admitting: *Deleted

## 2016-07-18 ENCOUNTER — Ambulatory Visit (HOSPITAL_COMMUNITY): Payer: Medicare Other

## 2016-07-18 ENCOUNTER — Ambulatory Visit (HOSPITAL_COMMUNITY)
Admission: RE | Admit: 2016-07-18 | Discharge: 2016-07-19 | Disposition: A | Discharge status: Home / Self Care | Payer: Medicare Other | Source: Ambulatory Visit | Attending: General Surgery | Admitting: General Surgery

## 2016-07-18 DIAGNOSIS — Z87891 Personal history of nicotine dependence: Secondary | ICD-10-CM | POA: Insufficient documentation

## 2016-07-18 DIAGNOSIS — Z933 Colostomy status: Secondary | ICD-10-CM | POA: Insufficient documentation

## 2016-07-18 DIAGNOSIS — I10 Essential (primary) hypertension: Secondary | ICD-10-CM | POA: Insufficient documentation

## 2016-07-18 DIAGNOSIS — Z7982 Long term (current) use of aspirin: Secondary | ICD-10-CM | POA: Diagnosis not present

## 2016-07-18 DIAGNOSIS — K8020 Calculus of gallbladder without cholecystitis without obstruction: Secondary | ICD-10-CM | POA: Diagnosis present

## 2016-07-18 DIAGNOSIS — F419 Anxiety disorder, unspecified: Secondary | ICD-10-CM | POA: Diagnosis not present

## 2016-07-18 DIAGNOSIS — F329 Major depressive disorder, single episode, unspecified: Secondary | ICD-10-CM | POA: Insufficient documentation

## 2016-07-18 DIAGNOSIS — Z79899 Other long term (current) drug therapy: Secondary | ICD-10-CM | POA: Insufficient documentation

## 2016-07-18 DIAGNOSIS — K8010 Calculus of gallbladder with chronic cholecystitis without obstruction: Secondary | ICD-10-CM | POA: Diagnosis not present

## 2016-07-18 DIAGNOSIS — Z7983 Long term (current) use of bisphosphonates: Secondary | ICD-10-CM | POA: Diagnosis not present

## 2016-07-18 DIAGNOSIS — I2510 Atherosclerotic heart disease of native coronary artery without angina pectoris: Secondary | ICD-10-CM | POA: Insufficient documentation

## 2016-07-18 DIAGNOSIS — Z79891 Long term (current) use of opiate analgesic: Secondary | ICD-10-CM | POA: Diagnosis not present

## 2016-07-18 DIAGNOSIS — K8050 Calculus of bile duct without cholangitis or cholecystitis without obstruction: Secondary | ICD-10-CM

## 2016-07-18 DIAGNOSIS — K449 Diaphragmatic hernia without obstruction or gangrene: Secondary | ICD-10-CM | POA: Diagnosis not present

## 2016-07-18 DIAGNOSIS — Z8673 Personal history of transient ischemic attack (TIA), and cerebral infarction without residual deficits: Secondary | ICD-10-CM | POA: Diagnosis not present

## 2016-07-18 DIAGNOSIS — K433 Parastomal hernia with obstruction, without gangrene: Secondary | ICD-10-CM | POA: Diagnosis not present

## 2016-07-18 DIAGNOSIS — K219 Gastro-esophageal reflux disease without esophagitis: Secondary | ICD-10-CM | POA: Diagnosis not present

## 2016-07-18 HISTORY — PX: CHOLECYSTECTOMY: SHX55

## 2016-07-18 HISTORY — DX: Calculus of gallbladder without cholecystitis without obstruction: K80.20

## 2016-07-18 SURGERY — LAPAROSCOPIC CHOLECYSTECTOMY WITH INTRAOPERATIVE CHOLANGIOGRAM
Anesthesia: General | Site: Abdomen

## 2016-07-18 MED ORDER — SUGAMMADEX SODIUM 200 MG/2ML IV SOLN
INTRAVENOUS | Status: DC | PRN
  Administered 2016-07-18: 120 mg via INTRAVENOUS

## 2016-07-18 MED ORDER — PROPOFOL 10 MG/ML IV BOLUS
INTRAVENOUS | Status: AC
  Filled 2016-07-18: qty 20

## 2016-07-18 MED ORDER — GLYCOPYRROLATE 0.2 MG/ML IJ SOLN
INTRAMUSCULAR | Status: DC | PRN
  Administered 2016-07-18: 0.2 mg via INTRAVENOUS

## 2016-07-18 MED ORDER — ONDANSETRON HCL 4 MG/2ML IJ SOLN
4.0000 mg | INTRAMUSCULAR | Status: DC | PRN
  Administered 2016-07-18: 4 mg via INTRAVENOUS
  Filled 2016-07-18: qty 2

## 2016-07-18 MED ORDER — IOPAMIDOL (ISOVUE-300) INJECTION 61%
INTRAVENOUS | Status: AC
  Filled 2016-07-18: qty 50

## 2016-07-18 MED ORDER — PROMETHAZINE HCL 25 MG/ML IJ SOLN
6.2500 mg | INTRAMUSCULAR | Status: AC | PRN
  Administered 2016-07-18 (×2): 6.25 mg via INTRAVENOUS

## 2016-07-18 MED ORDER — ONDANSETRON 4 MG PO TBDP: 4.0000 mg | ORAL_TABLET | Freq: Four times a day (QID) | ORAL | Status: DC | PRN

## 2016-07-18 MED ORDER — ALBUTEROL SULFATE (2.5 MG/3ML) 0.083% IN NEBU: 3.0000 mL | INHALATION_SOLUTION | Freq: Four times a day (QID) | RESPIRATORY_TRACT | Status: DC | PRN

## 2016-07-18 MED ORDER — ALPRAZOLAM 0.25 MG PO TABS: 0.2500 mg | ORAL_TABLET | Freq: Three times a day (TID) | ORAL | Status: DC | PRN

## 2016-07-18 MED ORDER — ETOMIDATE 2 MG/ML IV SOLN
INTRAVENOUS | Status: DC | PRN
  Administered 2016-07-18: 6 mg via INTRAVENOUS

## 2016-07-18 MED ORDER — CHLORHEXIDINE GLUCONATE CLOTH 2 % EX PADS: 6.0000 | MEDICATED_PAD | Freq: Once | CUTANEOUS | Status: DC

## 2016-07-18 MED ORDER — LIDOCAINE 2% (20 MG/ML) 5 ML SYRINGE
INTRAMUSCULAR | Status: DC | PRN
  Administered 2016-07-18: 60 mg via INTRAVENOUS

## 2016-07-18 MED ORDER — CEFAZOLIN SODIUM-DEXTROSE 2-4 GM/100ML-% IV SOLN
2.0000 g | INTRAVENOUS | Status: AC
  Administered 2016-07-18: 2 g via INTRAVENOUS
  Filled 2016-07-18: qty 100

## 2016-07-18 MED ORDER — ENOXAPARIN SODIUM 40 MG/0.4ML ~~LOC~~ SOLN: 40.0000 mg | SUBCUTANEOUS | Status: DC

## 2016-07-18 MED ORDER — TRAZODONE HCL 100 MG PO TABS
100.0000 mg | ORAL_TABLET | Freq: Every day | ORAL | Status: DC
  Administered 2016-07-18: 100 mg via ORAL
  Filled 2016-07-18: qty 1

## 2016-07-18 MED ORDER — FENTANYL CITRATE (PF) 100 MCG/2ML IJ SOLN
INTRAMUSCULAR | Status: DC | PRN
  Administered 2016-07-18 (×2): 50 ug via INTRAVENOUS
  Administered 2016-07-18: 25 ug via INTRAVENOUS
  Administered 2016-07-18: 50 ug via INTRAVENOUS

## 2016-07-18 MED ORDER — MORPHINE SULFATE (PF) 10 MG/ML IV SOLN
2.0000 mg | INTRAVENOUS | Status: DC | PRN
  Administered 2016-07-18: 2 mg via INTRAVENOUS
  Filled 2016-07-18: qty 1

## 2016-07-18 MED ORDER — POTASSIUM CHLORIDE ER 10 MEQ PO TBCR
10.0000 meq | EXTENDED_RELEASE_TABLET | Freq: Every day | ORAL | Status: DC
  Administered 2016-07-18 – 2016-07-19 (×2): 10 meq via ORAL
  Filled 2016-07-18 (×4): qty 1

## 2016-07-18 MED ORDER — PROMETHAZINE HCL 25 MG/ML IJ SOLN
INTRAMUSCULAR | Status: AC
  Administered 2016-07-18: 6.25 mg via INTRAVENOUS
  Filled 2016-07-18: qty 1

## 2016-07-18 MED ORDER — FENTANYL CITRATE (PF) 100 MCG/2ML IJ SOLN
INTRAMUSCULAR | Status: AC
  Filled 2016-07-18: qty 2

## 2016-07-18 MED ORDER — LOSARTAN POTASSIUM 50 MG PO TABS
100.0000 mg | ORAL_TABLET | Freq: Every day | ORAL | Status: DC
  Administered 2016-07-18 – 2016-07-19 (×2): 100 mg via ORAL
  Filled 2016-07-18 (×2): qty 2

## 2016-07-18 MED ORDER — IOPAMIDOL (ISOVUE-300) INJECTION 61%
INTRAVENOUS | Status: DC | PRN
  Administered 2016-07-18: 10 mL

## 2016-07-18 MED ORDER — RINGERS IRRIGATION IR SOLN
Status: DC | PRN
  Administered 2016-07-18: 1

## 2016-07-18 MED ORDER — DEXAMETHASONE SODIUM PHOSPHATE 10 MG/ML IJ SOLN
INTRAMUSCULAR | Status: DC | PRN
  Administered 2016-07-18: 10 mg via INTRAVENOUS

## 2016-07-18 MED ORDER — SUGAMMADEX SODIUM 200 MG/2ML IV SOLN
INTRAVENOUS | Status: AC
  Filled 2016-07-18: qty 2

## 2016-07-18 MED ORDER — GLYCOPYRROLATE 0.2 MG/ML IV SOSY
PREFILLED_SYRINGE | INTRAVENOUS | Status: AC
  Filled 2016-07-18: qty 5

## 2016-07-18 MED ORDER — EPHEDRINE SULFATE 50 MG/ML IJ SOLN
INTRAMUSCULAR | Status: DC | PRN
  Administered 2016-07-18: 5 mg via INTRAVENOUS

## 2016-07-18 MED ORDER — LIDOCAINE 2% (20 MG/ML) 5 ML SYRINGE
INTRAMUSCULAR | Status: AC
  Filled 2016-07-18: qty 5

## 2016-07-18 MED ORDER — ENOXAPARIN SODIUM 30 MG/0.3ML ~~LOC~~ SOLN: 30.0000 mg | SUBCUTANEOUS | Status: DC

## 2016-07-18 MED ORDER — HYDRALAZINE HCL 50 MG PO TABS
50.0000 mg | ORAL_TABLET | Freq: Three times a day (TID) | ORAL | Status: DC
  Administered 2016-07-18 – 2016-07-19 (×3): 50 mg via ORAL
  Filled 2016-07-18 (×3): qty 1

## 2016-07-18 MED ORDER — ISOSORBIDE MONONITRATE ER 30 MG PO TB24
30.0000 mg | ORAL_TABLET | Freq: Every morning | ORAL | Status: DC
  Administered 2016-07-19: 30 mg via ORAL
  Filled 2016-07-18: qty 1

## 2016-07-18 MED ORDER — BUPIVACAINE HCL (PF) 0.5 % IJ SOLN
INTRAMUSCULAR | Status: AC
  Filled 2016-07-18: qty 30

## 2016-07-18 MED ORDER — ROCURONIUM BROMIDE 50 MG/5ML IV SOSY
PREFILLED_SYRINGE | INTRAVENOUS | Status: AC
  Filled 2016-07-18: qty 5

## 2016-07-18 MED ORDER — EPHEDRINE 5 MG/ML INJ
INTRAVENOUS | Status: AC
  Filled 2016-07-18: qty 10

## 2016-07-18 MED ORDER — METOPROLOL SUCCINATE ER 25 MG PO TB24
25.0000 mg | ORAL_TABLET | Freq: Every day | ORAL | Status: DC
  Administered 2016-07-18: 25 mg via ORAL
  Filled 2016-07-18: qty 1

## 2016-07-18 MED ORDER — BUPIVACAINE HCL 0.5 % IJ SOLN
INTRAMUSCULAR | Status: DC | PRN
  Administered 2016-07-18: 8 mL

## 2016-07-18 MED ORDER — FERROUS SULFATE 325 (65 FE) MG PO TABS
325.0000 mg | ORAL_TABLET | Freq: Three times a day (TID) | ORAL | Status: DC
  Administered 2016-07-19: 325 mg via ORAL
  Filled 2016-07-18: qty 1

## 2016-07-18 MED ORDER — PANTOPRAZOLE SODIUM 40 MG PO TBEC
40.0000 mg | DELAYED_RELEASE_TABLET | Freq: Every day | ORAL | Status: DC
  Administered 2016-07-19: 40 mg via ORAL
  Filled 2016-07-18: qty 1

## 2016-07-18 MED ORDER — DEXAMETHASONE SODIUM PHOSPHATE 10 MG/ML IJ SOLN
INTRAMUSCULAR | Status: AC
  Filled 2016-07-18: qty 1

## 2016-07-18 MED ORDER — ONDANSETRON HCL 4 MG/2ML IJ SOLN
INTRAMUSCULAR | Status: DC | PRN
  Administered 2016-07-18: 4 mg via INTRAVENOUS

## 2016-07-18 MED ORDER — HYDROMORPHONE HCL 2 MG/ML IJ SOLN
INTRAMUSCULAR | Status: AC
  Administered 2016-07-18: 0.5 mg via INTRAVENOUS
  Filled 2016-07-18: qty 1

## 2016-07-18 MED ORDER — PROPOFOL 10 MG/ML IV BOLUS
INTRAVENOUS | Status: DC | PRN
  Administered 2016-07-18: 100 mg via INTRAVENOUS

## 2016-07-18 MED ORDER — LACTATED RINGERS IV SOLN
INTRAVENOUS | Status: DC
  Administered 2016-07-18: 10:00:00 via INTRAVENOUS

## 2016-07-18 MED ORDER — FUROSEMIDE 20 MG PO TABS
20.0000 mg | ORAL_TABLET | Freq: Every day | ORAL | Status: DC
  Administered 2016-07-19: 20 mg via ORAL
  Filled 2016-07-18: qty 1

## 2016-07-18 MED ORDER — KCL IN DEXTROSE-NACL 20-5-0.45 MEQ/L-%-% IV SOLN
INTRAVENOUS | Status: DC
  Administered 2016-07-18 (×2): via INTRAVENOUS
  Filled 2016-07-18: qty 1000

## 2016-07-18 MED ORDER — ETOMIDATE 2 MG/ML IV SOLN
INTRAVENOUS | Status: AC
  Filled 2016-07-18: qty 10

## 2016-07-18 MED ORDER — ROCURONIUM BROMIDE 100 MG/10ML IV SOLN
INTRAVENOUS | Status: DC | PRN
  Administered 2016-07-18: 40 mg via INTRAVENOUS

## 2016-07-18 MED ORDER — ONDANSETRON HCL 4 MG/2ML IJ SOLN
INTRAMUSCULAR | Status: AC
  Filled 2016-07-18: qty 2

## 2016-07-18 MED ORDER — METOPROLOL SUCCINATE ER 25 MG PO TB24: 25.0000 mg | ORAL_TABLET | Freq: Two times a day (BID) | ORAL | Status: DC

## 2016-07-18 MED ORDER — HYDROCODONE-ACETAMINOPHEN 5-325 MG PO TABS
1.0000 | ORAL_TABLET | ORAL | Status: DC | PRN
  Administered 2016-07-19: 1 via ORAL
  Filled 2016-07-18: qty 1

## 2016-07-18 MED ORDER — HYDROMORPHONE HCL 2 MG/ML IJ SOLN
0.2500 mg | INTRAMUSCULAR | Status: DC | PRN
  Administered 2016-07-18 (×3): 0.5 mg via INTRAVENOUS

## 2016-07-18 SURGICAL SUPPLY — 48 items
APL SKNCLS STERI-STRIP NONHPOA (GAUZE/BANDAGES/DRESSINGS) ×1
APPLIER CLIP 5 13 M/L LIGAMAX5 (MISCELLANEOUS) ×3
APR CLP MED LRG 5 ANG JAW (MISCELLANEOUS) ×1
BAG SPEC RTRVL 10 TROC 200 (ENDOMECHANICALS) ×1
BAG SPEC RTRVL LRG 6X4 10 (ENDOMECHANICALS)
BENZOIN TINCTURE PRP APPL 2/3 (GAUZE/BANDAGES/DRESSINGS) ×3 IMPLANT
CABLE HIGH FREQUENCY MONO STRZ (ELECTRODE) ×3 IMPLANT
CATH REDDICK CHOLANGI 4FR 50CM (CATHETERS) ×3 IMPLANT
CHLORAPREP W/TINT 26ML (MISCELLANEOUS) ×3 IMPLANT
CLIP APPLIE 5 13 M/L LIGAMAX5 (MISCELLANEOUS) ×1 IMPLANT
CLOSURE WOUND 1/2 X4 (GAUZE/BANDAGES/DRESSINGS) ×1
COVER MAYO STAND STRL (DRAPES) ×3 IMPLANT
DECANTER SPIKE VIAL GLASS SM (MISCELLANEOUS) ×3 IMPLANT
DISSECTOR BLUNT TIP ENDO 5MM (MISCELLANEOUS) IMPLANT
DRAPE C-ARM 42X120 X-RAY (DRAPES) ×3 IMPLANT
DRSG TEGADERM 2-3/8X2-3/4 SM (GAUZE/BANDAGES/DRESSINGS) ×6 IMPLANT
ELECT REM PT RETURN 15FT ADLT (MISCELLANEOUS) ×3 IMPLANT
ENDOLOOP SUT PDS II  0 18 (SUTURE)
ENDOLOOP SUT PDS II 0 18 (SUTURE) IMPLANT
GAUZE SPONGE 2X2 8PLY STRL LF (GAUZE/BANDAGES/DRESSINGS) ×1 IMPLANT
GLOVE BIOGEL PI IND STRL 7.5 (GLOVE) IMPLANT
GLOVE BIOGEL PI INDICATOR 7.5 (GLOVE) ×8
GLOVE ECLIPSE 8.0 STRL XLNG CF (GLOVE) ×3 IMPLANT
GLOVE INDICATOR 8.0 STRL GRN (GLOVE) ×3 IMPLANT
GLOVE SURG ORTHO 8.0 STRL STRW (GLOVE) ×2 IMPLANT
GOWN STRL REUS W/TWL XL LVL3 (GOWN DISPOSABLE) ×11 IMPLANT
HEMOSTAT SNOW SURGICEL 2X4 (HEMOSTASIS) IMPLANT
IRRIG SUCT STRYKERFLOW 2 WTIP (MISCELLANEOUS) ×3
IRRIGATION SUCT STRKRFLW 2 WTP (MISCELLANEOUS) ×1 IMPLANT
IV CATH 14GX2 1/4 (CATHETERS) ×3 IMPLANT
IV LACTATED RINGERS 1000ML (IV SOLUTION) ×2 IMPLANT
KIT BASIN OR (CUSTOM PROCEDURE TRAY) ×3 IMPLANT
NS IRRIG 1000ML POUR BTL (IV SOLUTION) ×2 IMPLANT
POUCH RETRIEVAL ECOSAC 10 (ENDOMECHANICALS) IMPLANT
POUCH RETRIEVAL ECOSAC 10MM (ENDOMECHANICALS) ×2
POUCH SPECIMEN RETRIEVAL 10MM (ENDOMECHANICALS) IMPLANT
SCISSORS LAP 5X35 DISP (ENDOMECHANICALS) ×3 IMPLANT
SLEEVE XCEL OPT CAN 5 100 (ENDOMECHANICALS) ×6 IMPLANT
SPONGE GAUZE 2X2 STER 10/PKG (GAUZE/BANDAGES/DRESSINGS) ×2
STRIP CLOSURE SKIN 1/2X4 (GAUZE/BANDAGES/DRESSINGS) ×2 IMPLANT
SUT MNCRL AB 4-0 PS2 18 (SUTURE) ×5 IMPLANT
TOWEL OR 17X26 10 PK STRL BLUE (TOWEL DISPOSABLE) ×3 IMPLANT
TOWEL OR NON WOVEN STRL DISP B (DISPOSABLE) ×2 IMPLANT
TRAY LAPAROSCOPIC (CUSTOM PROCEDURE TRAY) ×3 IMPLANT
TROCAR BLADELESS OPT 5 100 (ENDOMECHANICALS) ×3 IMPLANT
TROCAR XCEL BLUNT TIP 100MML (ENDOMECHANICALS) ×3 IMPLANT
TROCAR XCEL NON-BLD 11X100MML (ENDOMECHANICALS) IMPLANT
TUBING INSUF HEATED (TUBING) ×3 IMPLANT

## 2016-07-18 NOTE — Anesthesia Preprocedure Evaluation (Addendum)
Anesthesia Evaluation  Patient identified by MRN, date of birth, ID band Patient awake    Reviewed: Allergy & Precautions, NPO status , Patient's Chart, lab work & pertinent test results  History of Anesthesia Complications (+) PONV and history of anesthetic complications  Airway Mallampati: II  TM Distance: >3 FB Neck ROM: Full    Dental  (+) Edentulous Lower, Teeth Intact, Caps   Pulmonary former smoker,    Pulmonary exam normal        Cardiovascular hypertension, + CAD  Normal cardiovascular exam     Neuro/Psych PSYCHIATRIC DISORDERS Anxiety Depression TIA Neuromuscular disease    GI/Hepatic Neg liver ROS, hiatal hernia, PUD, GERD  ,  Endo/Other  negative endocrine ROS  Renal/GU      Musculoskeletal   Abdominal   Peds  Hematology negative hematology ROS (+)   Anesthesia Other Findings   Reproductive/Obstetrics                             Anesthesia Physical Anesthesia Plan  ASA: III  Anesthesia Plan: General   Post-op Pain Management:    Induction: Intravenous  Airway Management Planned: Oral ETT  Additional Equipment:   Intra-op Plan:   Post-operative Plan: Extubation in OR  Informed Consent: I have reviewed the patients History and Physical, chart, labs and discussed the procedure including the risks, benefits and alternatives for the proposed anesthesia with the patient or authorized representative who has indicated his/her understanding and acceptance.   Dental advisory given  Plan Discussed with: CRNA, Anesthesiologist and Surgeon  Anesthesia Plan Comments:         Anesthesia Quick Evaluation

## 2016-07-18 NOTE — Op Note (Signed)
OPERATIVE NOTE-LAPAROSCOPIC CHOLECYSTECTOMY  Preoperative diagnosis:  Symptomatic cholelithiasis  Postoperative diagnosis:  Same  Procedure: Laparoscopic cholecystectomy with cholangiogram.  Surgeon: [name], M.D.  Asst.:  Armandina Gemma  Indication for assistant:  She had multiple previous abdominal operations and assistance was needed for adequate exposure and safe placement of trochars.  Anesthesia: General  EBL:  Less than 100 cc  Indication:   This is a 82 year old female with intermittent episodes of what she describes as debilitating right upper quadrant pain rating to the back. She has known gallstones. She also has a large recurrent hiatal hernia. The symptoms are more biliary in nature. She now presents for the above procedure.  Technique: She was brought to the operating room, placed supine on the operating table, and a general anesthetic was administered.The abdominal wall was then sterilely prepped and draped.  A timeout was performed.    Local anesthetic (Marcaine) was infiltrated in the supraumbilical region. A small supraumbilical incision was made, in a midline scar, through the skin, subcutaneous tissue, fascia, and peritoneum entering the peritoneal cavity under direct vision. A pursestring suture of 0 Vicryl was placed around the edges of the fascia. A Hassan trocar was introduced into the peritoneal cavity and a pneumoperitoneum was created by insufflation of carbon dioxide gas. The laparoscope was introduced into the trocar and no underlying bleeding or organ injury was noted. She was then placed in the reverse Trendelenburg position with the right side tilted slightly up.  Three 5 mm trocars were then placed into the abdominal cavity under laparoscopic vision. One in the epigastric area, and 2 in the right upper quadrant area. The gallbladder was visualized and the fundus was grasped and retracted toward the right shoulder.  The infundibulum was mobilized with  dissection close to the gallbladder and retracted laterally. The cystic duct was identified and a window was created around it. The cystic artery was also identified and a window was created around it. The critical view was achieved. A clip was placed at the neck of the gallbladder. A small incision was made in the cystic duct. A cholangiocatheter was introduced through the anterior abdominal wall and placed in the cystic duct. A intraoperative cholangiogram was then performed.  Under real-time fluoroscopy, dilute contrast was injected into the cystic duct.  The common hepatic duct, the right and left hepatic ducts, and the common duct were all visualized. Contrast drained into the duodenum without obvious evidence of any obstructing ductal lesion. The final report is pending the Radiologist's interpretation.  The cholangiocatheter was removed, the cystic duct was clipped 3 times on the biliary side, and then the cystic duct was divided sharply. No bile leak was noted from the cystic duct stump.  The cystic artery was then clipped and divided. Following this the gallbladder was dissected free from the liver using electrocautery. The gallbladder was then placed in a retrieval bag and removed from the abdominal cavity through the subumbilical incision.  The gallbladder fossa was inspected, irrigated, and bleeding was controlled with electrocautery. Inspection showed that hemostasis was adequate and there was no evidence of bile leak.  The irrigation fluid was evacuated as much as possible.  The supraumbilical trocar was removed and the fascial defect was closed by tightening and tying down the pursestring suture under laparoscopic vision.  The remaining trocars were removed and the pneumoperitoneum was released. The skin incisions were closed with 4-0 Monocryl subcuticular stitches. Steri-Strips and sterile dressings were applied.  The procedure was well-tolerated without any apparent  complications. She  was taken to the recovery room in satisfactory condition.

## 2016-07-18 NOTE — Interval H&P Note (Signed)
History and Physical Interval Note:  07/18/2016 1:23 PM  [name]  has presented today for surgery, with the diagnosis of Symptomatic cholelithiasis  The various methods of treatment have been discussed with the patient and family. After consideration of risks, benefits and other options for treatment, the patient has consented to  Procedure(s): LAPAROSCOPIC CHOLECYSTECTOMY WITH INTRAOPERATIVE CHOLANGIOGRAM (N/A) as a surgical intervention .  The patient's history has been reviewed, patient examined, no change in status, stable for surgery.  I have reviewed the patient's chart and labs.  Questions were answered to the patient's satisfaction.     Omega Durante [name]

## 2016-07-18 NOTE — H&P (View-Only) (Signed)
[name] 06/25/2016 3:41 PM Location: [facility] Patient #: [id number] DOB: [birth year] Divorced / Language: [name] / Race: White Female   History of Present Illness Heather Hollingshead MD; 06/25/2016 5:00 PM) The patient is a 81 year old female.  Note:She is been sent to see [place] today by Dr. Hilarie Richardson because of right upper quadrant pain radiating around to the back with nausea and some vomiting and gallstones. She's had known gallstones for over a year. She also has a large recurrent hiatal hernia. She was supposed to have her hiatal hernia repaired in [place] in September 2017 but decided not to have it done. She is also noted to have a large tubulovillous polyp in her ascending colon. She had endoscopic mucosal resection and was supposed to have a follow-up colonoscopy but she decided she did not want to have that done. She says it is too difficult driving back and forth from [facility]. She states the pain she is having in her upper abdomen now that radiates through to her back and is limiting her and she does not want to continue living this way. She also reports some pain around her left-sided colostomy that radiates down into her groin. 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  She is here with her sister. She is due to see her cardiologist, Dr. Bronson Richardson in early Heather.  I have reviewed her extensive past medical history.  Allergies ([name], [place]; 06/25/2016 3:42 PM) Sulfa Antibiotics   Medication History ([name], [place]; 06/25/2016 3:42 PM) Citalopram Hydrobromide (40MG  Tablet, Oral) Active. Aspirin EC (81MG  Tablet DR, Oral) Active. Ferrous Sulfate (325 (65 Fe)MG Tablet, Oral) Active. Xanax (0.25MG  Tablet, Oral) Active. NexIUM (40MG  Capsule DR, Oral) Active. Losartan Potassium (100MG  Tablet, Oral) Active. Systane Balance (0.6% Solution, Ophthalmic as needed) Active. TraZODone HCl (100MG  Tablet, Oral) Active. Vitamin  D3 (5000UNIT Tablet Chewable, Oral) Active. Zolpidem Tartrate (10MG  Tablet, Oral) Active. Amoxicillin-Pot Clavulanate (875-125MG  Tablet, Oral) Active. HydroCHLOROthiazide (25MG  Tablet, Oral) Active. CeleXA (20MG  Tablet, Oral) Active. Guaifenesin (600MG  Tablet ER, Oral) Active. HydrALAZINE HCl (25MG  Tablet, Oral) Active. Metoprolol Succinate ER (25MG  Tablet ER 24HR, Oral) Active. Nitroglycerin (0.4MG  Tab Sublingual, Sublingual) Active. Oxycodone-Acetaminophen (5-325MG  Tablet, Oral) Active. ProAir HFA (108 (90 Base)MCG/ACT Aerosol Soln, Inhalation) Active. Rosuvastatin Calcium (10MG  Tablet, Oral) Active. Albuterol Sulfate ER (8MG  Tablet ER 12HR, Oral) Active. Fosamax (70MG  Tablet, Oral) Active. AmLODIPine Besylate (10MG  Tablet, Oral) Active. Claritin (10MG  Tablet, Oral) Active. Multivitamins (Oral) Active. Medications Reconciled    Review of Systems Heather Hollingshead MD; 06/25/2016 4:53 PM)  Note: General he-she is not eating much.  Abdomen-as per HPI.   Vitals ([name] CMA; 06/25/2016 3:42 PM) 06/25/2016 3:42 PM Weight: 134 lb Height: 62in Body Surface Area: 1.61 m Body Mass Index: 24.51 kg/m  Temp.: 98.96F(Oral)  Pulse: 68 (Regular)  BP: 180/80 (Sitting, Left Arm, Standard)       Physical Exam Heather Hollingshead MD; 06/25/2016 4:55 PM) The physical exam findings are as follows: Note:GENERAL APPEARANCE: Elderly female in NAD. Pleasant and cooperative.  EARS, NOSE, MOUTH THROAT: [name] external ears: no lesions or deformities external nose: no lesions or deformities hearing: grossly normal lips: moist, no deformities EYES external: conjunctiva, lids, sclerae normal pupils: equal, round glasses: yes  CV ascultation: RRR, no murmur extremity edema: no extremity varicosities: no  RESP auscultation: breath sounds equal and clear respiratory effort: normal  GASTROINTESTINAL abdomen: Soft, tender in right upper quadrant but  no guarding, non-distended, no masses, left-sided colostomy  present liver and spleen: not enlarged. hernia: There is a reducible bulge around the left-sided colostomy scar: Multiple scars  SKIN rash or lesion: none No jaundice  NEUROLOGIC speech: normal  PSYCHIATRIC alertness and orientation: normal mood/affect/behavior: Slightly anxious judgement and insight: normal    Assessment & Plan Heather Hollingshead MD; 06/25/2016 4:58 PM) SYMPTOMATIC CHOLELITHIASIS (K80.20) Impression: Her symptoms are highly suggestive of biliary colic. She does have the large recurrent hiatal hernia but has not had symptoms like this before from it. As for her left lower quadrant discomfort, I believe its from the parastomal hernia. She states she does not want to live with the right upper quadrant and back pain. We discussed that indeed the pain could be indeed be from the gallbladder but also could be coming from the recurrent hiatal hernia. Thus, there was not a guarantee that removing the gallbladder would completely remove the pain. She understands this but would like to proceed with cholecystectomy.  Laparoscopic possible open cholecystectomy. We'll wait until after she sees Dr. Bronson Richardson before scheduling the surgery. I have explained the procedure, risks, and aftercare of cholecystectomy. Risks include but are not limited to bleeding, infection, wound problems, anesthesia, diarrhea, bile leak, injury to common bile duct/liver/intestine. She seems to understand and agrees with the plan. Current Plans Schedule for Surgery Pt Education - [name] - Laparoscopic Gallbladder Surgery: discussed with patient and provided information. Pt Education - [facility] Free Text Education/Instructions: discussed with patient and provided information. HIATAL HERNIA (K44.9) PARASTOMAL HERNIA WITHOUT OBSTRUCTION OR GANGRENE (K43.5)  Addendum Note(Heather Fayette Adalberto Cole MD; 07/11/2016 3:58 PM) She saw Dr. Bronson Richardson. He  feels she can proceed with cholecystectomy with an acceptable level of risk from a cardiac standpoint. We will start scheduling process.  [name], M.D.

## 2016-07-18 NOTE — Discharge Instructions (Addendum)
[facility] ______[facility] LAPAROSCOPIC CHOLECYSTECTOMY SURGERY: POST OP INSTRUCTIONS Always review your discharge instruction sheet given to you by the facility where your surgery was performed. IF YOU HAVE DISABILITY OR FAMILY LEAVE FORMS, YOU MUST BRING THEM TO THE OFFICE FOR PROCESSING.   DO NOT GIVE THEM TO YOUR DOCTOR.  1. A prescription for pain medication may be given to you upon discharge.  Take your pain medication as prescribed, if needed.  If narcotic pain medicine is not needed, then you may take acetaminophen (Tylenol) or ibuprofen (Advil) as needed. 2. Take your usually prescribed medications unless otherwise directed. 3. If you need a refill on your pain medication, please contact your pharmacy.  They will contact our office to request authorization. Prescriptions will not be filled after 5pm or on week-ends. 4. You should follow a light diet the first few days after arrival home, such as soup and crackers, etc.  Be sure to include lots of fluids daily.  May start lowfat, solid foods 2 days after the surgery. 5. Most patients will experience some swelling and bruising in the area of the incisions.  Ice packs will help.  Swelling and bruising can take several days to resolve.  6. It is common to experience some constipation if taking pain medication after surgery.  Increasing fluid intake and taking a stool softener (such as Colace) will usually help or prevent this problem from occurring.  A mild laxative (Milk of Magnesia or Miralax) should be taken according to package instructions if there are no bowel movements after 48 hours. 7. Unless discharge instructions indicate otherwise, you may remove your bandages 72 hours after surgery.  You may shower the day after surgery.  You may have steri-strips (small skin tapes) in place directly over the incision.  These strips should be left on the skin until they fall off.  If your surgeon used skin glue on the incision, you may  shower in 24 hours.  The glue will flake off over the next 2-3 weeks.  Any sutures or staples will be removed at the office during your follow-up visit. 8. ACTIVITIES:  You may resume regular (light) daily activities beginning the next day--such as daily self-care, walking, climbing stairs--gradually increasing activities as tolerated.  You may have sexual intercourse when it is comfortable.  Refrain from any heavy lifting or straining for two weeks.  Do not lift anything over 10 pounds during that time.  a. You may drive when you are no longer taking prescription pain medication, you can comfortably wear a seatbelt, and you can safely maneuver your car and apply brakes. b. RETURN TO WORK:  Desk type work in 1 week, full duty work in 2 weeks if you are pain-free.________________________________________________________ 9. You should see your doctor in the office for a follow-up appointment approximately 2-3 weeks after your surgery.  Make sure that you call for this appointment within a day or two after you arrive home to insure a convenient appointment time. 10. OTHER INSTRUCTIONS: ____Take Iron 3 times a day with meals.______________________________________________________________________________________________________________________ __________________________________________________________________________________________________________________________ WHEN TO CALL YOUR DOCTOR: 1. Fever over 101.0 2. Inability to urinate 3. Continued bleeding from incision. 4. Increased pain, redness, or drainage from the incision. 5. Increasing abdominal pain  The clinic staff is available to answer your questions during regular business hours.  Please dont hesitate to call and ask to speak to one of the nurses for clinical concerns.  If you have a medical emergency, go to the nearest emergency room  or call 911.  A surgeon from [facility] is always on call at the hospital. [address]  [place], [place], [place]  [address] ? [address], [place], [place]   [address] [phone] ? [phone] ? FAX [phone] Web site: [email or web address]

## 2016-07-18 NOTE — Anesthesia Postprocedure Evaluation (Addendum)
Anesthesia Post Note  Patient: [name]  Procedure(s) Performed: Procedure(s) (LRB): LAPAROSCOPIC CHOLECYSTECTOMY WITH INTRAOPERATIVE CHOLANGIOGRAM (N/A)  Patient location during evaluation: PACU Anesthesia Type: General Level of consciousness: sedated Pain management: pain level controlled Vital Signs Assessment: post-procedure vital signs reviewed and stable Respiratory status: spontaneous breathing and respiratory function stable Cardiovascular status: stable Anesthetic complications: no       Last Vitals:  Vitals:   07/18/16 1630 07/18/16 1653  BP: (!) 169/67 (!) 199/79  Pulse: (!) 59 73  Resp: 17 14  Temp: 36.5 C 36.6 C    Last Pain:  Vitals:   07/18/16 1700  TempSrc:   PainSc: 3                  Leopold Smyers DANIEL

## 2016-07-18 NOTE — Anesthesia Procedure Notes (Signed)
Procedure Name: Intubation Date/Time: 07/18/2016 2:09 PM Performed by: Carleene Cooper A Pre-anesthesia Checklist: Patient identified, Emergency Drugs available, Suction available, Timeout performed and Patient being monitored Patient Re-evaluated:Patient Re-evaluated prior to inductionOxygen Delivery Method: Circle system utilized Preoxygenation: Pre-oxygenation with 100% oxygen Intubation Type: IV induction Ventilation: Mask ventilation without difficulty Laryngoscope Size: Mac and 4 Grade View: Grade I Tube type: Oral Tube size: 7.5 mm Airway Equipment and Method: Stylet Placement Confirmation: ETT inserted through vocal cords under direct vision,  positive ETCO2 and breath sounds checked- equal and bilateral Secured at: 21 cm Tube secured with: Tape Dental Injury: Teeth and Oropharynx as per pre-operative assessment

## 2016-07-18 NOTE — Transfer of Care (Signed)
Immediate Anesthesia Transfer of Care Note  Patient: [name]  Procedure(s) Performed: Procedure(s): LAPAROSCOPIC CHOLECYSTECTOMY WITH INTRAOPERATIVE CHOLANGIOGRAM (N/A)  Patient Location: PACU  Anesthesia Type:General  Level of Consciousness: awake, alert , oriented and patient cooperative  Airway & Oxygen Therapy: Patient Spontanous Breathing and Patient connected to face mask oxygen  Post-op Assessment: Report given to RN, Post -op Vital signs reviewed and stable and Patient moving all extremities  Post vital signs: Reviewed and stable  Last Vitals:  Vitals:   07/18/16 0903 07/18/16 0939  BP:  (!) 142/50  Pulse: (!) 57   Resp: 18   Temp: 36.6 C     Last Pain:  Vitals:   07/18/16 0903  TempSrc: Oral      Patients Stated Pain Goal: 4 (75/88/32 5498)  Complications: No apparent anesthesia complications

## 2016-07-19 DIAGNOSIS — K8010 Calculus of gallbladder with chronic cholecystitis without obstruction: Secondary | ICD-10-CM | POA: Diagnosis not present

## 2016-07-19 MED ORDER — TRAMADOL HCL 50 MG PO TABS: 50.0000 mg | ORAL_TABLET | Freq: Four times a day (QID) | ORAL | 0 refills | Status: DC | PRN

## 2016-07-19 NOTE — Progress Notes (Signed)
Assessment Active Problems:   Symptomatic cholelithiasis s/p lap chole with IOC 07/18/16-doing well this AM.   Plan:  Discharge.  Instructions given to her.   LOS: 0 days     1 Day Post-Op  Chief Complaint/Subjective: A little sore otherwise doing okay.  Objective: Vital signs in last 24 hours: Temp:  [97.7 F (36.5 C)-98.3 F (36.8 C)] 98.1 F (36.7 C) (04/20 0504) Pulse Rate:  [58-73] 71 (04/20 0504) Resp:  [14-20] 16 (04/20 0504) BP: (133-199)/(50-79) 164/70 (04/20 0504) SpO2:  [93 %-100 %] 93 % (04/20 0504) Weight:  [60.3 kg (133 lb)] 60.3 kg (133 lb) (04/19 0937) Last BM Date: 07/19/16  Intake/Output from previous day: 04/19 0701 - 04/20 0700 In: 1920 [P.O.:120; I.V.:1800] Out: 625 [Urine:600; Blood:25] Intake/Output this shift: Total I/O In: 120 [P.O.:120] Out: 400 [Urine:400]  PE: General- In NAD.  Awake and alert. Abdomen-dressings dry, minimal tenderness  Lab Results:  No results for input(s): WBC, HGB, HCT, PLT in the last 72 hours. BMET No results for input(s): NA, K, CL, CO2, GLUCOSE, BUN, CREATININE, CALCIUM in the last 72 hours. PT/INR No results for input(s): LABPROT, INR in the last 72 hours. Comprehensive Metabolic Panel:    Component Value Date/Time   NA 141 07/15/2016 1157   NA 135 05/31/2016 1511   NA 142 12/18/2015 0919   K 4.4 07/15/2016 1157   K 4.0 05/31/2016 1511   CL 109 07/15/2016 1157   CL 101 05/31/2016 1511   CO2 24 07/15/2016 1157   CO2 25 05/31/2016 1511   BUN 18 07/15/2016 1157   BUN 19 05/31/2016 1511   BUN 16 12/18/2015 0919   CREATININE 1.34 (H) 07/15/2016 1157   CREATININE 1.36 (H) 05/31/2016 1511   GLUCOSE 98 07/15/2016 1157   GLUCOSE 93 05/31/2016 1511   CALCIUM 9.1 07/15/2016 1157   CALCIUM 9.0 05/31/2016 1511   AST 13 (L) 07/15/2016 1157   AST 13 (L) 05/31/2016 1511   ALT 6 (L) 07/15/2016 1157   ALT 7 (L) 05/31/2016 1511   ALKPHOS 66 07/15/2016 1157   ALKPHOS 65 05/31/2016 1511   BILITOT 0.6 07/15/2016  1157   BILITOT 0.7 05/31/2016 1511   BILITOT <0.2 12/18/2015 0919   PROT 6.9 07/15/2016 1157   PROT 6.7 05/31/2016 1511   PROT 6.1 12/18/2015 0919   ALBUMIN 3.4 (L) 07/15/2016 1157   ALBUMIN 3.3 (L) 05/31/2016 1511   ALBUMIN 3.2 (L) 12/18/2015 0919     Studies/Results: Dg Cholangiogram Operative  Result Date: 07/18/2016 CLINICAL DATA:  Right upper quadrant pain EXAM: INTRAOPERATIVE CHOLANGIOGRAM TECHNIQUE: Cholangiographic images from the C-arm fluoroscopic device were submitted for interpretation post-operatively. Please see the procedural report for the amount of contrast and the fluoroscopy time utilized. COMPARISON:  None. FINDINGS: Contrast fills the biliary tree and duodenum without filling defects in the common bile duct. IMPRESSION: Patent biliary tree. Electronically Signed   By: Marybelle Killings M.D.   On: 07/18/2016 15:07    Anti-infectives: Anti-infectives    Start     Dose/Rate Route Frequency Ordered Stop   07/18/16 0901  ceFAZolin (ANCEF) IVPB 2g/100 mL premix     2 g 200 mL/hr over 30 Minutes Intravenous On call to O.R. 07/18/16 0901 07/18/16 1410       Yeudiel Mateo J 07/19/2016

## 2016-07-19 NOTE — Progress Notes (Signed)
Pt was discharged home today. Instructions were reviewed with patient, and questions were answered. Pt was taken to main entrance via wheelchair by NT.  

## 2016-07-24 ENCOUNTER — Other Ambulatory Visit: Payer: Self-pay | Admitting: Physician Assistant

## 2016-07-25 ENCOUNTER — Telehealth: Payer: Self-pay | Admitting: Physician Assistant

## 2016-07-25 ENCOUNTER — Other Ambulatory Visit: Payer: Self-pay | Admitting: Physician Assistant

## 2016-07-25 MED ORDER — FLUTICASONE PROPIONATE 50 MCG/ACT NA SUSP: 2.0000 | Freq: Every day | NASAL | 6 refills | Status: DC

## 2016-07-25 NOTE — Telephone Encounter (Signed)
Covering for PCP  Recommend changing claritin to plain zyrtec 1 pill once daily, also start flonase 2 sprays per nostril once daily.    Laroy Apple, MD [facility] 07/25/2016, 11:29 AM

## 2016-07-25 NOTE — Telephone Encounter (Signed)
Defer to pcp, ok to wait 1 day, does not need refill for 1 + week.   Laroy Apple, MD [facility] 07/25/2016, 11:31 AM

## 2016-08-05 ENCOUNTER — Telehealth: Payer: Self-pay | Admitting: *Deleted

## 2016-08-05 NOTE — Telephone Encounter (Signed)
Pt aware that [name] doesn't have any openings until the 15th but that another physician could see her if she gets worse = or she could call ems again or go to the ER. She will call if she needs sooner appt

## 2016-08-13 ENCOUNTER — Ambulatory Visit (INDEPENDENT_AMBULATORY_CARE_PROVIDER_SITE_OTHER): Payer: Medicare Other | Admitting: Physician Assistant

## 2016-08-13 ENCOUNTER — Encounter: Payer: Self-pay | Admitting: Physician Assistant

## 2016-08-13 VITALS — BP 159/71 | HR 47 | Temp 96.9°F | Ht 62.0 in | Wt 131.6 lb

## 2016-08-13 DIAGNOSIS — M4646 Discitis, unspecified, lumbar region: Secondary | ICD-10-CM | POA: Diagnosis not present

## 2016-08-13 DIAGNOSIS — N183 Chronic kidney disease, stage 3 unspecified: Secondary | ICD-10-CM

## 2016-08-13 DIAGNOSIS — E611 Iron deficiency: Secondary | ICD-10-CM | POA: Diagnosis not present

## 2016-08-13 DIAGNOSIS — I10 Essential (primary) hypertension: Secondary | ICD-10-CM

## 2016-08-13 DIAGNOSIS — M5136 Other intervertebral disc degeneration, lumbar region: Secondary | ICD-10-CM

## 2016-08-13 MED ORDER — AZITHROMYCIN 250 MG PO TABS: ORAL_TABLET | ORAL | 0 refills | Status: DC

## 2016-08-13 NOTE — Progress Notes (Signed)
BP (!) 159/71   Pulse (!) 47   Temp (!) 96.9 F (36.1 C) (Oral)   Ht 5\' 2"  (1.575 m)   Wt 131 lb 9.6 oz (59.7 kg)   BMI 24.07 kg/m    Subjective:    Patient ID: [name], female    DOB: [birth year], 82 y.o.   MRN: [id number]  HPI: [name] is a 82 y.o. female presenting on 08/13/2016 for Hypertension (2 mos ckup) and head congestion (chest congestion started yesterday, )  This patient comes in for periodic recheck on medications and conditions including hypertension, fluctuating pulse, iron deficiency anemia, s/p gall bladder surgery. Reports doing well through the surgery. Found that she was mildly anemic, started iron TID one month ago. We will check the progress today.    All medications are reviewed today. There are no reports of any problems with the medications. All of the medical conditions are reviewed and updated.  Lab work is reviewed and will be ordered as medically necessary. There are no new problems reported with today's visit.    Relevant past medical, surgical, family and social history reviewed and updated as indicated. Allergies and medications reviewed and updated.  Past Medical History:  Diagnosis Date  . Anemia   . Anxiety state, unspecified   . Atrial fibrillation with RVR ([place])   . [name]'s esophagus   . Bilateral sciatica   . Cellulitis of extremity   . Chronic back pain   . Congestive heart failure, unspecified    diastolic heart failure  . Coronary artery disease    a. Moderate to severe coronary disease involving the left anterior descending artery and right coronary artery.  Sequential stenosis in the LAD is significant.  Right coronary artery is moderate to severely likely nonischemic. Questionable small LVOT obstruction.  No Brockenbrough maneuver was performed.  Catheterization January 2013; b. 05/2014 MV no isch/infarct, EF 60%.  . Depressive disorder, not elsewhere classified   . Diverticulitis   . Erosive esophagitis 08/04/2015  . Esophageal  reflux   . Gastric volvulus 08/04/2015  . Gastritis   . H/O hiatal hernia   . Hypertension   . IBS (irritable bowel syndrome)   . Infection of esophagostomy ([place])   . Macular degeneration   . Melanoma ([place])    removal on back   . Pelvic abscess in female   . Perforated bowel ([place])   . Perforated sigmoid colon ([place])   . PONV (postoperative nausea and vomiting)   . Pulmonary hypertension ([facility])    PA systolic pressure 17-49 mmHg by echocardiogram, PA pressure 33/10 by cardiac catheterization PA saturation 62% thermodilution cardiac index 2.0 thick cardiac index 2.4  . Pure hypercholesterolemia   . Tricuspid regurgitation    moderate tricuspid regurgitation by echocardiogram, prior use of anorexic agents    Past Surgical History:  Procedure Laterality Date  . CARDIAC CATHETERIZATION Left 03/05/2015   Procedure: CENTRAL LINE INSERTION;  Surgeon: Aviva Signs, MD;  Location: [facility];  Service: General;  Laterality: Left;  . CATARACT EXTRACTION W/ INTRAOCULAR LENS  IMPLANT, BILATERAL  ~ 2002  . CHOLECYSTECTOMY N/A 07/18/2016   Procedure: LAPAROSCOPIC CHOLECYSTECTOMY WITH INTRAOPERATIVE CHOLANGIOGRAM;  Surgeon: [name], MD;  Location: [facility];  Service: General;  Laterality: N/A;  . COLECTOMY WITH COLOSTOMY CREATION/HARTMANN PROCEDURE N/A 03/05/2015   Procedure: PARTIAL COLECTOMY WITH COLOSTOMY CREATION/HARTMANN PROCEDURE;  Surgeon: Aviva Signs, MD;  Location: [facility];  Service: General;  Laterality: N/A;  . CORONARY ANGIOPLASTY WITH STENT PLACEMENT  05/08/11   "1"  . ESOPHAGEAL MANOMETRY N/A 06/06/2014   Procedure: ESOPHAGEAL MANOMETRY (EM);  Surgeon: Jerene Bears, MD;  Location: [facility];  Service: Gastroenterology;  Laterality: N/A;  . ESOPHAGOGASTRODUODENOSCOPY (EGD) WITH PROPOFOL N/A 08/04/2015   Procedure: ESOPHAGOGASTRODUODENOSCOPY (EGD) WITH PROPOFOL;  Surgeon: Mauri Pole, MD;  Location: [facility];  Service: Endoscopy;  Laterality: N/A;  . HERNIA REPAIR    . IR GENERIC  HISTORICAL  03/04/2016   IR LUMBAR DISC ASPIRATION W/IMG GUIDE 03/04/2016 Luanne Bras, MD [facility]  . LAPAROSCOPIC NISSEN FUNDOPLICATION N/A 07/16/3843   Procedure: LAPAROSCOPIC  LYSIS OF ADHESIONS, SEGMENTAL GASTRECTOMY, PARTIAL REDUCTION OF HERNIA;  Surgeon: [name], MD;  Location: [facility];  Service: General;  Laterality: N/A;  . NISSEN FUNDOPLICATION  3646  . PERCUTANEOUS CORONARY STENT INTERVENTION (PCI-S) N/A 05/08/2011   Procedure: PERCUTANEOUS CORONARY STENT INTERVENTION (PCI-S);  Surgeon: Wellington Hampshire, MD;  [facility];  Service: Cardiovascular;  Laterality: N/A;  . ROTATOR CUFF REPAIR  ~ 2001   right  . SHOULDER ARTHROSCOPY  ~ 2004; 2005   left; "joint's wore out"  . TONSILLECTOMY  ~ 1944  . TUBAL LIGATION  1972    Review of Systems  Constitutional: Negative.  Negative for activity change, fatigue and fever.  HENT: Positive for congestion, facial swelling, postnasal drip and sinus pain.   Eyes: Negative.   Respiratory: Positive for cough.   Cardiovascular: Negative.  Negative for chest pain.  Gastrointestinal: Negative.  Negative for abdominal pain.  Endocrine: Negative.   Genitourinary: Negative.  Negative for dysuria.  Musculoskeletal: Negative.   Skin: Negative.   Neurological: Negative.     Allergies as of 08/13/2016      Reactions   Sulfamethoxazole Rash      Medication List       Accurate as of 08/13/16  8:46 AM. Always use your most recent med list.          albuterol 108 (90 Base) MCG/ACT inhaler Commonly known as:  PROVENTIL HFA;VENTOLIN HFA Inhale 2 puffs into the lungs every 6 (six) hours as needed for wheezing or shortness of breath.   ALPRAZolam 0.25 MG tablet Commonly known as:  XANAX TAKE 1/2 TO 1 TABLET 3 TIMES DAILY AS NEEDED   azithromycin 250 MG tablet Commonly known as:  ZITHROMAX Z-PAK Take as directed   citalopram 40 MG tablet Commonly known as:  CELEXA TAKE ONE (1) TABLET EACH DAY   esomeprazole 40 MG  capsule Commonly known as:  NEXIUM TAKE ONE (1) CAPSULE EACH DAY   ferrous sulfate 325 (65 FE) MG EC tablet Take 325 mg by mouth daily.   fluticasone 50 MCG/ACT nasal spray Commonly known as:  FLONASE Place 2 sprays into both nostrils daily.   furosemide 20 MG tablet Commonly known as:  LASIX TAKE ONE (1) TABLET EACH DAY   hydrALAZINE 50 MG tablet Commonly known as:  APRESOLINE TAKE ONE (1) TABLET THREE (3) TIMES EACH DAY   ibuprofen 200 MG tablet Commonly known as:  ADVIL,MOTRIN Take 400 mg by mouth daily as needed for headache or moderate pain.   isosorbide mononitrate 30 MG 24 hr tablet Commonly known as:  IMDUR TAKE ONE TABLET EVERY MORNING   loratadine 10 MG tablet Commonly known as:  CLARITIN Take 10 mg by mouth daily.   losartan 100 MG tablet Commonly known as:  COZAAR TAKE ONE (1) TABLET EACH DAY   metoprolol succinate 25 MG 24 hr tablet Commonly known as:  TOPROL-XL TAKE ONE TABLET BY MOUTH TWICE DAILY   nitroGLYCERIN 0.4 MG SL tablet Commonly known as:  NITROSTAT Place 0.4 mg under the tongue every 5 (five) minutes as needed for chest pain. Reported on 04/26/2015   polyethylene glycol powder powder Commonly known as:  GLYCOLAX/MIRALAX MIX 17 GRAMS IN AT LEAST 8OZ OF WATER/JUICE AND DRINK ONCE OR TWICE DAILY   potassium chloride 10 MEQ tablet Commonly known as:  K-DUR TAKE ONE (1) TABLET EACH DAY   PRESERVISION AREDS Tabs Take 1 tablet by mouth 2 (two) times daily.   rosuvastatin 10 MG tablet Commonly known as:  CRESTOR TAKE ONE (1) TABLET EACH DAY   traMADol 50 MG tablet Commonly known as:  ULTRAM Take 1-2 tablets (50-100 mg total) by mouth every 6 (six) hours as needed for moderate pain or severe pain.   traZODone 100 MG tablet Commonly known as:  DESYREL TAKE ONE TABLET DAILY AT BEDTIME   Vitamin D (Ergocalciferol) 50000 units Caps capsule Commonly known as:  DRISDOL TAKE 1 CAPSULE EVERY WEEK   zolpidem 10 MG tablet Commonly known as:   AMBIEN TAKE ONE (1) TABLET AT BEDTIME          Objective:    BP (!) 159/71   Pulse (!) 47   Temp (!) 96.9 F (36.1 C) (Oral)   Ht 5\' 2"  (1.575 m)   Wt 131 lb 9.6 oz (59.7 kg)   BMI 24.07 kg/m   Allergies  Allergen Reactions  . Sulfamethoxazole Rash    Physical Exam  Constitutional: She is oriented to person, place, and time. She appears well-developed and well-nourished.  HENT:  Head: Normocephalic and atraumatic.  Eyes: Conjunctivae and EOM are normal. Pupils are equal, round, and reactive to light.  Cardiovascular: Normal rate, regular rhythm, normal heart sounds and intact distal pulses.   Pulmonary/Chest: Effort normal and breath sounds normal.  Abdominal: Soft. Bowel sounds are normal.  Neurological: She is alert and oriented to person, place, and time. She has normal reflexes.  Skin: Skin is warm and dry. No rash noted.  Psychiatric: She has a normal mood and affect. Her behavior is normal. Judgment and thought content normal.  Nursing note and vitals reviewed.   Results for orders placed or performed during the hospital encounter of 07/15/16  CBC WITH DIFFERENTIAL  Result Value Ref Range   WBC 7.5 4.0 - 10.5 K/uL   RBC 3.71 (L) 3.87 - 5.11 MIL/uL   Hemoglobin 9.3 (L) 12.0 - 15.0 g/dL   HCT 29.4 (L) 36.0 - 46.0 %   MCV 79.2 78.0 - 100.0 fL   MCH 25.1 (L) 26.0 - 34.0 pg   MCHC 31.6 30.0 - 36.0 g/dL   RDW 16.4 (H) 11.5 - 15.5 %   Platelets 267 150 - 400 K/uL   Neutrophils Relative % 69 %   Neutro Abs 5.1 1.7 - 7.7 K/uL   Lymphocytes Relative 19 %   Lymphs Abs 1.4 0.7 - 4.0 K/uL   Monocytes Relative 6 %   Monocytes Absolute 0.5 0.1 - 1.0 K/uL   Eosinophils Relative 6 %   Eosinophils Absolute 0.5 0.0 - 0.7 K/uL   Basophils Relative 0 %   Basophils Absolute 0.0 0.0 - 0.1 K/uL  Comprehensive metabolic panel  Result Value Ref Range   Sodium 141 135 - 145 mmol/L   Potassium 4.4 3.5 - 5.1 mmol/L   Chloride 109 101 - 111 mmol/L   CO2 24 22 - 32 mmol/L  Glucose, Bld 98 65 - 99 mg/dL   BUN 18 6 - 20 mg/dL   Creatinine, Ser 1.34 (H) 0.44 - 1.00 mg/dL   Calcium 9.1 8.9 - 10.3 mg/dL   Total Protein 6.9 6.5 - 8.1 g/dL   Albumin 3.4 (L) 3.5 - 5.0 g/dL   AST 13 (L) 15 - 41 U/L   ALT 6 (L) 14 - 54 U/L   Alkaline Phosphatase 66 38 - 126 U/L   Total Bilirubin 0.6 0.3 - 1.2 mg/dL   GFR calc non Af Amer 37 (L) >60 mL/min   GFR calc Af Amer 42 (L) >60 mL/min   Anion gap 8 5 - 15      Assessment & Plan:   1. Essential hypertension  2. Discitis of lumbar region L5- S1  3. DDD (degenerative disc disease), lumbar  4. CKD (chronic kidney disease) stage 3, GFR 30-59 ml/min  5. Iron deficiency - CBC with Differential   Current Outpatient Prescriptions:  .  albuterol (PROVENTIL HFA;VENTOLIN HFA) 108 (90 Base) MCG/ACT inhaler, Inhale 2 puffs into the lungs every 6 (six) hours as needed for wheezing or shortness of breath. , Disp: , Rfl:  .  ALPRAZolam (XANAX) 0.25 MG tablet, TAKE 1/2 TO 1 TABLET 3 TIMES DAILY AS NEEDED, Disp: 270 tablet, Rfl: 1 .  citalopram (CELEXA) 40 MG tablet, TAKE ONE (1) TABLET EACH DAY, Disp: 90 tablet, Rfl: 3 .  esomeprazole (NEXIUM) 40 MG capsule, TAKE ONE (1) CAPSULE EACH DAY, Disp: 90 capsule, Rfl: 1 .  ferrous sulfate 325 (65 FE) MG EC tablet, Take 325 mg by mouth daily. , Disp: , Rfl:  .  fluticasone (FLONASE) 50 MCG/ACT nasal spray, Place 2 sprays into both nostrils daily., Disp: 16 g, Rfl: 6 .  furosemide (LASIX) 20 MG tablet, TAKE ONE (1) TABLET EACH DAY, Disp: 90 tablet, Rfl: 1 .  hydrALAZINE (APRESOLINE) 50 MG tablet, TAKE ONE (1) TABLET THREE (3) TIMES EACH DAY, Disp: 270 tablet, Rfl: 1 .  ibuprofen (ADVIL,MOTRIN) 200 MG tablet, Take 400 mg by mouth daily as needed for headache or moderate pain., Disp: , Rfl:  .  isosorbide mononitrate (IMDUR) 30 MG 24 hr tablet, TAKE ONE TABLET EVERY MORNING, Disp: 90 tablet, Rfl: 1 .  loratadine (CLARITIN) 10 MG tablet, Take 10 mg by mouth daily., Disp: , Rfl:  .  losartan  (COZAAR) 100 MG tablet, TAKE ONE (1) TABLET EACH DAY, Disp: 90 tablet, Rfl: 1 .  metoprolol succinate (TOPROL-XL) 25 MG 24 hr tablet, TAKE ONE TABLET BY MOUTH TWICE DAILY (Patient taking differently: Take 25mg  by mouth once daily at night), Disp: 180 tablet, Rfl: 1 .  Multiple Vitamins-Minerals (PRESERVISION AREDS) TABS, Take 1 tablet by mouth 2 (two) times daily. , Disp: , Rfl:  .  nitroGLYCERIN (NITROSTAT) 0.4 MG SL tablet, Place 0.4 mg under the tongue every 5 (five) minutes as needed for chest pain. Reported on 04/26/2015, Disp: , Rfl:  .  polyethylene glycol powder (GLYCOLAX/MIRALAX) powder, MIX 17 GRAMS IN AT LEAST 8OZ OF WATER/JUICE AND DRINK ONCE OR TWICE DAILY (Patient taking differently: Take 17 g by mouth daily. MIX 17 GRAMS IN AT LEAST 8OZ OF WATER/JUICE AND DRINK), Disp: , Rfl:  .  potassium chloride (K-DUR) 10 MEQ tablet, TAKE ONE (1) TABLET EACH DAY, Disp: 90 tablet, Rfl: 1 .  rosuvastatin (CRESTOR) 10 MG tablet, TAKE ONE (1) TABLET EACH DAY (Patient taking differently: Take 10 mgs once daily at night), Disp: 90 tablet, Rfl: 1 .  traMADol (ULTRAM) 50 MG tablet, Take 1-2 tablets (50-100 mg total) by mouth every 6 (six) hours as needed for moderate pain or severe pain., Disp: 30 tablet, Rfl: 0 .  traZODone (DESYREL) 100 MG tablet, TAKE ONE TABLET DAILY AT BEDTIME, Disp: 90 tablet, Rfl: 1 .  Vitamin D, Ergocalciferol, (DRISDOL) 50000 units CAPS capsule, TAKE 1 CAPSULE EVERY WEEK (Patient taking differently: TAKE 1 CAPSULE EVERY WEEK ON TUESDAYS), Disp: 4 capsule, Rfl: 11 .  zolpidem (AMBIEN) 10 MG tablet, TAKE ONE (1) TABLET AT BEDTIME, Disp: 90 tablet, Rfl: 1 .  azithromycin (ZITHROMAX Z-PAK) 250 MG tablet, Take as directed, Disp: 6 each, Rfl: 0  Continue all other maintenance medications as listed above.  Follow up plan: Return in about 2 months (around 10/13/2016) for recheck.  Educational handout given for iron deficinecy  Terald Sleeper PA-C [facility] [address]  [place], [place] [address] [phone]   08/13/2016, 8:46 AM

## 2016-08-13 NOTE — Patient Instructions (Signed)

## 2016-08-14 ENCOUNTER — Telehealth: Payer: Self-pay | Admitting: Physician Assistant

## 2016-08-14 LAB — CBC WITH DIFFERENTIAL/PLATELET
BASOS ABS: 0 10*3/uL (ref 0.0–0.2)
BASOS: 1 %
EOS (ABSOLUTE): 0.4 10*3/uL (ref 0.0–0.4)
Eos: 8 %
Hematocrit: 31.6 % — ABNORMAL LOW (ref 34.0–46.6)
Hemoglobin: 9.9 g/dL — ABNORMAL LOW (ref 11.1–15.9)
IMMATURE GRANS (ABS): 0 10*3/uL (ref 0.0–0.1)
IMMATURE GRANULOCYTES: 0 %
LYMPHS ABS: 1.6 10*3/uL (ref 0.7–3.1)
LYMPHS: 39 %
MCH: 25 pg — AB (ref 26.6–33.0)
MCHC: 31.3 g/dL — ABNORMAL LOW (ref 31.5–35.7)
MCV: 80 fL (ref 79–97)
MONOCYTES: 6 %
Monocytes Absolute: 0.3 10*3/uL (ref 0.1–0.9)
NEUTROS ABS: 1.9 10*3/uL (ref 1.4–7.0)
Neutrophils: 46 %
PLATELETS: 310 10*3/uL (ref 150–379)
RBC: 3.96 x10E6/uL (ref 3.77–5.28)
RDW: 17.6 % — AB (ref 12.3–15.4)
WBC: 4.2 10*3/uL (ref 3.4–10.8)

## 2016-08-14 NOTE — Telephone Encounter (Signed)
Patient aware of lab results and verbalizes understanding.  

## 2016-08-23 ENCOUNTER — Encounter: Payer: Self-pay | Admitting: Physician Assistant

## 2016-08-23 ENCOUNTER — Ambulatory Visit (INDEPENDENT_AMBULATORY_CARE_PROVIDER_SITE_OTHER): Payer: Medicare Other | Admitting: Physician Assistant

## 2016-08-23 VITALS — BP 144/60 | HR 46 | Temp 97.0°F | Ht 62.0 in | Wt 133.0 lb

## 2016-08-23 DIAGNOSIS — J0100 Acute maxillary sinusitis, unspecified: Secondary | ICD-10-CM | POA: Diagnosis not present

## 2016-08-23 MED ORDER — AMOXICILLIN 500 MG PO CAPS: 500.0000 mg | ORAL_CAPSULE | Freq: Three times a day (TID) | ORAL | 0 refills | Status: DC

## 2016-08-23 MED ORDER — METHYLPREDNISOLONE ACETATE 80 MG/ML IJ SUSP
80.0000 mg | Freq: Once | INTRAMUSCULAR | Status: AC
  Administered 2016-08-23: 80 mg via INTRAMUSCULAR

## 2016-08-23 NOTE — Patient Instructions (Signed)

## 2016-08-23 NOTE — Progress Notes (Signed)
BP (!) 144/60   Pulse (!) 46   Temp 97 F (36.1 C) (Oral)   Ht 5\' 2"  (1.575 m)   Wt 133 lb (60.3 kg)   BMI 24.33 kg/m    Subjective:    Patient ID: [name], female    DOB: [birth year], 82 y.o.   MRN: [id number]  HPI: [name] is a 82 y.o. female presenting on 08/23/2016 for Sinusitis  This patient has had many days of sinus headache and postnasal drainage. There is copious drainage at times. Denies any fever at this time. There has been a history of sinus infections in the past.  No history of sinus surgery. There is cough at night. It has become more prevalent in recent days.  Relevant past medical, surgical, family and social history reviewed and updated as indicated. Allergies and medications reviewed and updated.  Past Medical History:  Diagnosis Date  . Anemia   . Anxiety state, unspecified   . Atrial fibrillation with RVR ([place])   . [name]'s esophagus   . Bilateral sciatica   . Cellulitis of extremity   . Chronic back pain   . Congestive heart failure, unspecified    diastolic heart failure  . Coronary artery disease    a. Moderate to severe coronary disease involving the left anterior descending artery and right coronary artery.  Sequential stenosis in the LAD is significant.  Right coronary artery is moderate to severely likely nonischemic. Questionable small LVOT obstruction.  No Brockenbrough maneuver was performed.  Catheterization January 2013; b. 05/2014 MV no isch/infarct, EF 60%.  . Depressive disorder, not elsewhere classified   . Diverticulitis   . Erosive esophagitis 08/04/2015  . Esophageal reflux   . Gastric volvulus 08/04/2015  . Gastritis   . H/O hiatal hernia   . Hypertension   . IBS (irritable bowel syndrome)   . Infection of esophagostomy ([place])   . Macular degeneration   . Melanoma ([place])    removal on back   . Pelvic abscess in female   . Perforated bowel ([place])   . Perforated sigmoid colon ([place])   . PONV (postoperative nausea and vomiting)     . Pulmonary hypertension (H[facility])    PA systolic pressure 25-95 mmHg by echocardiogram, PA pressure 33/10 by cardiac catheterization PA saturation 62% thermodilution cardiac index 2.0 thick cardiac index 2.4  . Pure hypercholesterolemia   . Tricuspid regurgitation    moderate tricuspid regurgitation by echocardiogram, prior use of anorexic agents    Past Surgical History:  Procedure Laterality Date  . CARDIAC CATHETERIZATION Left 03/05/2015   Procedure: CENTRAL LINE INSERTION;  Surgeon: Aviva Signs, MD;  Location: [facility];  Service: General;  Laterality: Left;  . CATARACT EXTRACTION W/ INTRAOCULAR LENS  IMPLANT, BILATERAL  ~ 2002  . CHOLECYSTECTOMY N/A 07/18/2016   Procedure: LAPAROSCOPIC CHOLECYSTECTOMY WITH INTRAOPERATIVE CHOLANGIOGRAM;  Surgeon: [name], MD;  Location: [facility];  Service: General;  Laterality: N/A;  . COLECTOMY WITH COLOSTOMY CREATION/HARTMANN PROCEDURE N/A 03/05/2015   Procedure: PARTIAL COLECTOMY WITH COLOSTOMY CREATION/HARTMANN PROCEDURE;  Surgeon: Aviva Signs, MD;  Location: [facility];  Service: General;  Laterality: N/A;  . CORONARY ANGIOPLASTY WITH STENT PLACEMENT  05/08/11   "1"  . ESOPHAGEAL MANOMETRY N/A 06/06/2014   Procedure: ESOPHAGEAL MANOMETRY (EM);  Surgeon: Jerene Bears, MD;  Location: [facility];  Service: Gastroenterology;  Laterality: N/A;  . ESOPHAGOGASTRODUODENOSCOPY (EGD) WITH PROPOFOL N/A 08/04/2015   Procedure: ESOPHAGOGASTRODUODENOSCOPY (EGD) WITH PROPOFOL;  Surgeon: Mauri Pole, MD;  Location: [facility];  Service: Endoscopy;  Laterality: N/A;  . HERNIA REPAIR    . IR GENERIC HISTORICAL  03/04/2016   IR LUMBAR DISC ASPIRATION W/IMG GUIDE 03/04/2016 Luanne Bras, MD [facility]  . LAPAROSCOPIC NISSEN FUNDOPLICATION N/A 1/54/0086   Procedure: LAPAROSCOPIC  LYSIS OF ADHESIONS, SEGMENTAL GASTRECTOMY, PARTIAL REDUCTION OF HERNIA;  Surgeon: [name], MD;  Location: [facility];  Service: General;  Laterality: N/A;  . NISSEN FUNDOPLICATION   7619  . PERCUTANEOUS CORONARY STENT INTERVENTION (PCI-S) N/A 05/08/2011   Procedure: PERCUTANEOUS CORONARY STENT INTERVENTION (PCI-S);  Surgeon: Wellington Hampshire, MD;  Location: [facility] CATH [facility];  Service: Cardiovascular;  Laterality: N/A;  . ROTATOR CUFF REPAIR  ~ 2001   right  . SHOULDER ARTHROSCOPY  ~ 2004; 2005   left; "joint's wore out"  . TONSILLECTOMY  ~ 1944  . TUBAL LIGATION  1972    Review of Systems  Constitutional: Positive for chills, fatigue and fever. Negative for activity change and appetite change.  HENT: Positive for congestion, postnasal drip, sinus pain, sinus pressure and sore throat.   Eyes: Negative.   Respiratory: Positive for cough. Negative for wheezing.   Cardiovascular: Negative.  Negative for chest pain, palpitations and leg swelling.  Gastrointestinal: Negative.   Genitourinary: Negative.   Musculoskeletal: Negative.   Skin: Negative.   Neurological: Positive for headaches.    Allergies as of 08/23/2016      Reactions   Sulfamethoxazole Rash      Medication List       Accurate as of 08/23/16 12:02 PM. Always use your most recent med list.          albuterol 108 (90 Base) MCG/ACT inhaler Commonly known as:  PROVENTIL HFA;VENTOLIN HFA Inhale 2 puffs into the lungs every 6 (six) hours as needed for wheezing or shortness of breath.   ALPRAZolam 0.25 MG tablet Commonly known as:  XANAX TAKE 1/2 TO 1 TABLET 3 TIMES DAILY AS NEEDED   amoxicillin 500 MG capsule Commonly known as:  AMOXIL Take 1 capsule (500 mg total) by mouth 3 (three) times daily.   citalopram 40 MG tablet Commonly known as:  CELEXA TAKE ONE (1) TABLET EACH DAY   esomeprazole 40 MG capsule Commonly known as:  NEXIUM TAKE ONE (1) CAPSULE EACH DAY   ferrous sulfate 325 (65 FE) MG EC tablet Take 325 mg by mouth daily.   fluticasone 50 MCG/ACT nasal spray Commonly known as:  FLONASE Place 2 sprays into both nostrils daily.   furosemide 20 MG tablet Commonly known as:   LASIX TAKE ONE (1) TABLET EACH DAY   hydrALAZINE 50 MG tablet Commonly known as:  APRESOLINE TAKE ONE (1) TABLET THREE (3) TIMES EACH DAY   isosorbide mononitrate 30 MG 24 hr tablet Commonly known as:  IMDUR TAKE ONE TABLET EVERY MORNING   loratadine 10 MG tablet Commonly known as:  CLARITIN Take 10 mg by mouth daily.   losartan 100 MG tablet Commonly known as:  COZAAR TAKE ONE (1) TABLET EACH DAY   metoprolol succinate 25 MG 24 hr tablet Commonly known as:  TOPROL-XL TAKE ONE TABLET BY MOUTH TWICE DAILY   nitroGLYCERIN 0.4 MG SL tablet Commonly known as:  NITROSTAT Place 0.4 mg under the tongue every 5 (five) minutes as needed for chest pain. Reported on 04/26/2015   polyethylene glycol powder powder Commonly known as:  GLYCOLAX/MIRALAX MIX 17 GRAMS IN AT LEAST 8OZ OF WATER/JUICE AND DRINK ONCE OR TWICE DAILY   potassium chloride  10 MEQ tablet Commonly known as:  K-DUR TAKE ONE (1) TABLET EACH DAY   PRESERVISION AREDS Tabs Take 1 tablet by mouth 2 (two) times daily.   rosuvastatin 10 MG tablet Commonly known as:  CRESTOR TAKE ONE (1) TABLET EACH DAY   traZODone 100 MG tablet Commonly known as:  DESYREL TAKE ONE TABLET DAILY AT BEDTIME   Vitamin D (Ergocalciferol) 50000 units Caps capsule Commonly known as:  DRISDOL TAKE 1 CAPSULE EVERY WEEK   zolpidem 10 MG tablet Commonly known as:  AMBIEN TAKE ONE (1) TABLET AT BEDTIME          Objective:    BP (!) 144/60   Pulse (!) 46   Temp 97 F (36.1 C) (Oral)   Ht 5\' 2"  (1.575 m)   Wt 133 lb (60.3 kg)   BMI 24.33 kg/m   Allergies  Allergen Reactions  . Sulfamethoxazole Rash    Physical Exam  Constitutional: She is oriented to person, place, and time. She appears well-developed and well-nourished.  HENT:  Head: Normocephalic and atraumatic.  Right Ear: Tympanic membrane and external ear normal. No middle ear effusion.  Left Ear: Tympanic membrane and external ear normal.  No middle ear effusion.   Nose: Mucosal edema and rhinorrhea present. Right sinus exhibits no maxillary sinus tenderness. Left sinus exhibits no maxillary sinus tenderness.  Mouth/Throat: Uvula is midline. Posterior oropharyngeal erythema present.  Eyes: Conjunctivae and EOM are normal. Pupils are equal, round, and reactive to light. Right eye exhibits no discharge. Left eye exhibits no discharge.  Neck: Normal range of motion.  Cardiovascular: Normal rate, regular rhythm and normal heart sounds.   Pulmonary/Chest: Effort normal and breath sounds normal. No respiratory distress. She has no wheezes.  Abdominal: Soft.  Lymphadenopathy:    She has no cervical adenopathy.  Neurological: She is alert and oriented to person, place, and time.  Skin: Skin is warm and dry.  Psychiatric: She has a normal mood and affect.        Assessment & Plan:   1. Acute non-recurrent maxillary sinusitis - methylPREDNISolone acetate (DEPO-MEDROL) injection 80 mg; Inject 1 mL (80 mg total) into the muscle once.   Current Outpatient Prescriptions:  .  albuterol (PROVENTIL HFA;VENTOLIN HFA) 108 (90 Base) MCG/ACT inhaler, Inhale 2 puffs into the lungs every 6 (six) hours as needed for wheezing or shortness of breath. , Disp: , Rfl:  .  ALPRAZolam (XANAX) 0.25 MG tablet, TAKE 1/2 TO 1 TABLET 3 TIMES DAILY AS NEEDED, Disp: 270 tablet, Rfl: 1 .  citalopram (CELEXA) 40 MG tablet, TAKE ONE (1) TABLET EACH DAY, Disp: 90 tablet, Rfl: 3 .  esomeprazole (NEXIUM) 40 MG capsule, TAKE ONE (1) CAPSULE EACH DAY, Disp: 90 capsule, Rfl: 1 .  ferrous sulfate 325 (65 FE) MG EC tablet, Take 325 mg by mouth daily. , Disp: , Rfl:  .  fluticasone (FLONASE) 50 MCG/ACT nasal spray, Place 2 sprays into both nostrils daily., Disp: 16 g, Rfl: 6 .  furosemide (LASIX) 20 MG tablet, TAKE ONE (1) TABLET EACH DAY, Disp: 90 tablet, Rfl: 1 .  hydrALAZINE (APRESOLINE) 50 MG tablet, TAKE ONE (1) TABLET THREE (3) TIMES EACH DAY, Disp: 270 tablet, Rfl: 1 .  isosorbide  mononitrate (IMDUR) 30 MG 24 hr tablet, TAKE ONE TABLET EVERY MORNING, Disp: 90 tablet, Rfl: 1 .  loratadine (CLARITIN) 10 MG tablet, Take 10 mg by mouth daily., Disp: , Rfl:  .  losartan (COZAAR) 100 MG tablet, TAKE ONE (1) TABLET  EACH DAY, Disp: 90 tablet, Rfl: 1 .  metoprolol succinate (TOPROL-XL) 25 MG 24 hr tablet, TAKE ONE TABLET BY MOUTH TWICE DAILY (Patient taking differently: Take 25mg  by mouth once daily at night), Disp: 180 tablet, Rfl: 1 .  Multiple Vitamins-Minerals (PRESERVISION AREDS) TABS, Take 1 tablet by mouth 2 (two) times daily. , Disp: , Rfl:  .  nitroGLYCERIN (NITROSTAT) 0.4 MG SL tablet, Place 0.4 mg under the tongue every 5 (five) minutes as needed for chest pain. Reported on 04/26/2015, Disp: , Rfl:  .  polyethylene glycol powder (GLYCOLAX/MIRALAX) powder, MIX 17 GRAMS IN AT LEAST 8OZ OF WATER/JUICE AND DRINK ONCE OR TWICE DAILY (Patient taking differently: Take 17 g by mouth daily. MIX 17 GRAMS IN AT LEAST 8OZ OF WATER/JUICE AND DRINK), Disp: , Rfl:  .  potassium chloride (K-DUR) 10 MEQ tablet, TAKE ONE (1) TABLET EACH DAY, Disp: 90 tablet, Rfl: 1 .  rosuvastatin (CRESTOR) 10 MG tablet, TAKE ONE (1) TABLET EACH DAY (Patient taking differently: Take 10 mgs once daily at night), Disp: 90 tablet, Rfl: 1 .  traZODone (DESYREL) 100 MG tablet, TAKE ONE TABLET DAILY AT BEDTIME, Disp: 90 tablet, Rfl: 1 .  Vitamin D, Ergocalciferol, (DRISDOL) 50000 units CAPS capsule, TAKE 1 CAPSULE EVERY WEEK (Patient taking differently: TAKE 1 CAPSULE EVERY WEEK ON TUESDAYS), Disp: 4 capsule, Rfl: 11 .  zolpidem (AMBIEN) 10 MG tablet, TAKE ONE (1) TABLET AT BEDTIME, Disp: 90 tablet, Rfl: 1 .  amoxicillin (AMOXIL) 500 MG capsule, Take 1 capsule (500 mg total) by mouth 3 (three) times daily., Disp: 30 capsule, Rfl: 0  Current Facility-Administered Medications:  .  methylPREDNISolone acetate (DEPO-MEDROL) injection 80 mg, 80 mg, Intramuscular, Once, Manav Pierotti S, PA-C  Continue all other maintenance  medications as listed above.  Follow up plan: Follow-up as needed or worsening of symptoms. Call office for any issues.   Educational handout given for sinusitis  Terald Sleeper PA-C [place] [address]  [place], [place] [address] [phone]   08/23/2016, 12:02 PM

## 2016-08-31 NOTE — Addendum Note (Signed)
Addendum  created 08/31/16 0825 by Duane Boston, MD   Sign clinical note

## 2016-09-05 ENCOUNTER — Telehealth: Payer: Self-pay | Admitting: Physician Assistant

## 2016-09-05 MED ORDER — CEFDINIR 300 MG PO CAPS: 300.0000 mg | ORAL_CAPSULE | Freq: Two times a day (BID) | ORAL | 0 refills | Status: DC

## 2016-09-05 NOTE — Telephone Encounter (Signed)
Patient aware.

## 2016-09-05 NOTE — Telephone Encounter (Signed)
What symptoms do you have? Headache ear is sore and up in cheek bones  How long have you been sick? Longer than two weeks  Have you been seen for this problem? Yes she has finished medication and still not better   If your provider decides to give you a prescription, which pharmacy would you like for it to be sent to? [facility] in [place]   Patient informed that this information will be sent to the clinical staff for review and that they should receive a follow up call.

## 2016-09-05 NOTE — Telephone Encounter (Signed)
Treat with omnicef, CrCl 32,no renal adjustment needed.   Covering for PCP  Laroy Apple, MD [facility] 09/05/2016, 4:22 PM

## 2016-09-18 ENCOUNTER — Other Ambulatory Visit: Payer: Self-pay | Admitting: Physician Assistant

## 2016-10-15 ENCOUNTER — Encounter: Payer: Self-pay | Admitting: Physician Assistant

## 2016-10-15 ENCOUNTER — Other Ambulatory Visit: Payer: Self-pay | Admitting: Physician Assistant

## 2016-10-15 ENCOUNTER — Other Ambulatory Visit: Payer: Self-pay | Admitting: Family Medicine

## 2016-10-15 ENCOUNTER — Ambulatory Visit (INDEPENDENT_AMBULATORY_CARE_PROVIDER_SITE_OTHER): Payer: Medicare Other | Admitting: Physician Assistant

## 2016-10-15 VITALS — BP 195/70 | HR 49 | Temp 97.1°F | Ht 62.0 in | Wt 135.2 lb

## 2016-10-15 DIAGNOSIS — I10 Essential (primary) hypertension: Secondary | ICD-10-CM

## 2016-10-15 DIAGNOSIS — K581 Irritable bowel syndrome with constipation: Secondary | ICD-10-CM | POA: Diagnosis not present

## 2016-10-15 DIAGNOSIS — I4891 Unspecified atrial fibrillation: Secondary | ICD-10-CM

## 2016-10-15 MED ORDER — MONTELUKAST SODIUM 10 MG PO TABS: 10.0000 mg | ORAL_TABLET | Freq: Every day | ORAL | 3 refills | Status: DC

## 2016-10-15 MED ORDER — VITAMIN D (ERGOCALCIFEROL) 1.25 MG (50000 UNIT) PO CAPS: ORAL_CAPSULE | ORAL | 3 refills | Status: DC

## 2016-10-15 NOTE — Progress Notes (Signed)
BP (!) 195/70   Pulse (!) 49   Temp (!) 97.1 F (36.2 C) (Oral)   Ht 5\' 2"  (1.575 m)   Wt 135 lb 3.2 oz (61.3 kg)   BMI 24.73 kg/m    Subjective:    Patient ID: [name], female    DOB: Mar 27, 1935, 82 y.o.   MRN: [id number]  HPI: [name] is a 82 y.o. female presenting on 10/15/2016 for Follow-up (2 month )  This patient comes in for periodic recheck on medications and conditions including hypertension, IBS, atrial fib. Overall doing well, did not sleep last night due to IBS flare up. Has a way to check BP at home, will report any high readings if they continue.  She also will see Dr. Onnie Graham for shoulder pain.  All medications are reviewed today. There are no reports of any problems with the medications. All of the medical conditions are reviewed and updated.  Lab work is reviewed and will be ordered as medically necessary. There are no new problems reported with today's visit.   Relevant past medical, surgical, family and social history reviewed and updated as indicated. Allergies and medications reviewed and updated.  Past Medical History:  Diagnosis Date  . Anemia   . Anxiety state, unspecified   . Atrial fibrillation with RVR ([place])   . [name]'s esophagus   . Bilateral sciatica   . Cellulitis of extremity   . Chronic back pain   . Congestive heart failure, unspecified    diastolic heart failure  . Coronary artery disease    a. Moderate to severe coronary disease involving the left anterior descending artery and right coronary artery.  Sequential stenosis in the LAD is significant.  Right coronary artery is moderate to severely likely nonischemic. Questionable small LVOT obstruction.  No Brockenbrough maneuver was performed.  Catheterization January 2013; b. 05/2014 MV no isch/infarct, EF 60%.  . Depressive disorder, not elsewhere classified   . Diverticulitis   . Erosive esophagitis 08/04/2015  . Esophageal reflux   . Gastric volvulus 08/04/2015  . Gastritis   . H/O  hiatal hernia   . Hypertension   . IBS (irritable bowel syndrome)   . Infection of esophagostomy ([place])   . Macular degeneration   . Melanoma ([place])    removal on back   . Pelvic abscess in female   . Perforated bowel ([place])   . Perforated sigmoid colon ([place])   . PONV (postoperative nausea and vomiting)   . Pulmonary hypertension (HCC)    PA systolic pressure 42-59 mmHg by echocardiogram, PA pressure 33/10 by cardiac catheterization PA saturation 62% thermodilution cardiac index 2.0 thick cardiac index 2.4  . Pure hypercholesterolemia   . Tricuspid regurgitation    moderate tricuspid regurgitation by echocardiogram, prior use of anorexic agents    Past Surgical History:  Procedure Laterality Date  . CARDIAC CATHETERIZATION Left 03/05/2015   Procedure: CENTRAL LINE INSERTION;  Surgeon: Aviva Signs, MD;  Location: [facility];  Service: General;  Laterality: Left;  . CATARACT EXTRACTION W/ INTRAOCULAR LENS  IMPLANT, BILATERAL  ~ 2002  . CHOLECYSTECTOMY N/A 07/18/2016   Procedure: LAPAROSCOPIC CHOLECYSTECTOMY WITH INTRAOPERATIVE CHOLANGIOGRAM;  Surgeon: [name], MD;  [facility]: [facility];  Service: General;  Laterality: N/A;  . COLECTOMY WITH COLOSTOMY CREATION/HARTMANN PROCEDURE N/A 03/05/2015   Procedure: PARTIAL COLECTOMY WITH COLOSTOMY CREATION/HARTMANN PROCEDURE;  Surgeon: Aviva Signs, MD;  Location: [facility];  Service: General;  Laterality: N/A;  . CORONARY ANGIOPLASTY WITH STENT PLACEMENT  05/08/11   "  1"  . ESOPHAGEAL MANOMETRY N/A 06/06/2014   Procedure: ESOPHAGEAL MANOMETRY (EM);  Surgeon: Jerene Bears, MD;  [facility];  Service: Gastroenterology;  Laterality: N/A;  . ESOPHAGOGASTRODUODENOSCOPY (EGD) WITH PROPOFOL N/A 08/04/2015   Procedure: ESOPHAGOGASTRODUODENOSCOPY (EGD) WITH PROPOFOL;  Surgeon: Mauri Pole, MD;  [facility];  Service: Endoscopy;  Laterality: N/A;  . HERNIA REPAIR    . IR GENERIC HISTORICAL  03/04/2016   IR LUMBAR DISC ASPIRATION W/IMG  GUIDE 03/04/2016 Luanne Bras, MD [facility]  . LAPAROSCOPIC NISSEN FUNDOPLICATION N/A 06/28/5186   Procedure: LAPAROSCOPIC  LYSIS OF ADHESIONS, SEGMENTAL GASTRECTOMY, PARTIAL REDUCTION OF HERNIA;  Surgeon: [name], MD;  [facility];  Service: General;  Laterality: N/A;  . NISSEN FUNDOPLICATION  4166  . PERCUTANEOUS CORONARY STENT INTERVENTION (PCI-S) N/A 05/08/2011   Procedure: PERCUTANEOUS CORONARY STENT INTERVENTION (PCI-S);  Surgeon: Wellington Hampshire, MD;  Location: [facility] CATH LAB;  Service: Cardiovascular;  Laterality: N/A;  . ROTATOR CUFF REPAIR  ~ 2001   right  . SHOULDER ARTHROSCOPY  ~ 2004; 2005   left; "joint's wore out"  . TONSILLECTOMY  ~ 1944  . TUBAL LIGATION  1972    Review of Systems  Constitutional: Negative.  Negative for activity change, fatigue and fever.  HENT: Positive for postnasal drip, rhinorrhea and sneezing.   Eyes: Positive for itching.  Respiratory: Negative.  Negative for cough.   Cardiovascular: Negative.  Negative for chest pain.  Gastrointestinal: Negative.  Negative for abdominal pain.  Endocrine: Negative.   Genitourinary: Negative.  Negative for dysuria.  Musculoskeletal: Positive for arthralgias.  Skin: Negative.   Neurological: Negative.     Allergies as of 10/15/2016      Reactions   Sulfamethoxazole Rash      Medication List       Accurate as of 10/15/16  8:33 AM. Always use your most recent med list.          albuterol 108 (90 Base) MCG/ACT inhaler Commonly known as:  PROVENTIL HFA;VENTOLIN HFA Inhale 2 puffs into the lungs every 6 (six) hours as needed for wheezing or shortness of breath.   ALPRAZolam 0.25 MG tablet Commonly known as:  XANAX TAKE 1/2 TO 1 TABLET 3 TIMES DAILY AS NEEDED   citalopram 40 MG tablet Commonly known as:  CELEXA TAKE ONE (1) TABLET EACH DAY   esomeprazole 40 MG capsule Commonly known as:  NEXIUM TAKE ONE (1) CAPSULE EACH DAY   ferrous sulfate 325 (65 FE) MG EC tablet Take 325 mg  by mouth daily.   fluticasone 50 MCG/ACT nasal spray Commonly known as:  FLONASE Place 2 sprays into both nostrils daily.   furosemide 20 MG tablet Commonly known as:  LASIX TAKE ONE (1) TABLET EACH DAY   hydrALAZINE 50 MG tablet Commonly known as:  APRESOLINE TAKE ONE (1) TABLET THREE (3) TIMES EACH DAY   isosorbide mononitrate 30 MG 24 hr tablet Commonly known as:  IMDUR TAKE ONE TABLET EVERY MORNING   loratadine 10 MG tablet Commonly known as:  CLARITIN TAKE ONE (1) TABLET EACH DAY   losartan 100 MG tablet Commonly known as:  COZAAR TAKE ONE (1) TABLET EACH DAY   metoprolol succinate 25 MG 24 hr tablet Commonly known as:  TOPROL-XL TAKE ONE TABLET BY MOUTH TWICE DAILY   montelukast 10 MG tablet Commonly known as:  SINGULAIR Take 1 tablet (10 mg total) by mouth at bedtime.   nitroGLYCERIN 0.4 MG SL tablet Commonly known as:  NITROSTAT  Place 0.4 mg under the tongue every 5 (five) minutes as needed for chest pain. Reported on 04/26/2015   polyethylene glycol powder powder Commonly known as:  GLYCOLAX/MIRALAX MIX 17 GRAMS IN AT LEAST 8OZ OF WATER/JUICE AND DRINK ONCE OR TWICE DAILY   potassium chloride 10 MEQ tablet Commonly known as:  K-DUR TAKE ONE (1) TABLET EACH DAY   PRESERVISION AREDS Tabs Take 1 tablet by mouth 2 (two) times daily.   rosuvastatin 10 MG tablet Commonly known as:  CRESTOR TAKE ONE (1) TABLET EACH DAY   traZODone 100 MG tablet Commonly known as:  DESYREL TAKE ONE TABLET DAILY AT BEDTIME   Vitamin D (Ergocalciferol) 50000 units Caps capsule Commonly known as:  DRISDOL TAKE 1 CAPSULE EVERY WEEK ON TUESDAYS   zolpidem 10 MG tablet Commonly known as:  AMBIEN TAKE ONE (1) TABLET AT BEDTIME          Objective:    BP (!) 195/70   Pulse (!) 49   Temp (!) 97.1 F (36.2 C) (Oral)   Ht 5\' 2"  (1.575 m)   Wt 135 lb 3.2 oz (61.3 kg)   BMI 24.73 kg/m   Allergies  Allergen Reactions  . Sulfamethoxazole Rash    Physical Exam    Constitutional: She is oriented to person, place, and time. She appears well-developed and well-nourished.  HENT:  Head: Normocephalic and atraumatic.  Right Ear: Tympanic membrane, external ear and ear canal normal.  Left Ear: Tympanic membrane, external ear and ear canal normal.  Nose: Nose normal. No rhinorrhea.  Mouth/Throat: Oropharynx is clear and moist and mucous membranes are normal. No oropharyngeal exudate or posterior oropharyngeal erythema.  Eyes: Pupils are equal, round, and reactive to light. Conjunctivae and EOM are normal.  Neck: Normal range of motion. Neck supple.  Cardiovascular: Normal rate, regular rhythm, normal heart sounds and intact distal pulses.   Pulmonary/Chest: Effort normal and breath sounds normal.  Abdominal: Soft. Bowel sounds are normal.  Neurological: She is alert and oriented to person, place, and time. She has normal reflexes.  Skin: Skin is warm and dry. No rash noted.  Psychiatric: She has a normal mood and affect. Her behavior is normal. Judgment and thought content normal.    Results for orders placed or performed in visit on 08/13/16  CBC with Differential  Result Value Ref Range   WBC 4.2 3.4 - 10.8 x10E3/uL   RBC 3.96 3.77 - 5.28 x10E6/uL   Hemoglobin 9.9 (L) 11.1 - 15.9 g/dL   Hematocrit 31.6 (L) 34.0 - 46.6 %   MCV 80 79 - 97 fL   MCH 25.0 (L) 26.6 - 33.0 pg   MCHC 31.3 (L) 31.5 - 35.7 g/dL   RDW 17.6 (H) 12.3 - 15.4 %   Platelets 310 150 - 379 x10E3/uL   Neutrophils 46 Not Estab. %   Lymphs 39 Not Estab. %   Monocytes 6 Not Estab. %   Eos 8 Not Estab. %   Basos 1 Not Estab. %   Neutrophils Absolute 1.9 1.4 - 7.0 x10E3/uL   Lymphocytes Absolute 1.6 0.7 - 3.1 x10E3/uL   Monocytes Absolute 0.3 0.1 - 0.9 x10E3/uL   EOS (ABSOLUTE) 0.4 0.0 - 0.4 x10E3/uL   Basophils Absolute 0.0 0.0 - 0.2 x10E3/uL   Immature Granulocytes 0 Not Estab. %   Immature Grans (Abs) 0.0 0.0 - 0.1 x10E3/uL      Assessment & Plan:   1. Essential  hypertension  2. Irritable bowel syndrome with constipation  3. Atrial fibrillation with RVR (HCC)   Current Outpatient Prescriptions:  .  albuterol (PROVENTIL HFA;VENTOLIN HFA) 108 (90 Base) MCG/ACT inhaler, Inhale 2 puffs into the lungs every 6 (six) hours as needed for wheezing or shortness of breath. , Disp: , Rfl:  .  ALPRAZolam (XANAX) 0.25 MG tablet, TAKE 1/2 TO 1 TABLET 3 TIMES DAILY AS NEEDED, Disp: 270 tablet, Rfl: 1 .  citalopram (CELEXA) 40 MG tablet, TAKE ONE (1) TABLET EACH DAY, Disp: 90 tablet, Rfl: 3 .  esomeprazole (NEXIUM) 40 MG capsule, TAKE ONE (1) CAPSULE EACH DAY, Disp: 90 capsule, Rfl: 1 .  ferrous sulfate 325 (65 FE) MG EC tablet, Take 325 mg by mouth daily. , Disp: , Rfl:  .  fluticasone (FLONASE) 50 MCG/ACT nasal spray, Place 2 sprays into both nostrils daily., Disp: 16 g, Rfl: 6 .  furosemide (LASIX) 20 MG tablet, TAKE ONE (1) TABLET EACH DAY, Disp: 90 tablet, Rfl: 1 .  hydrALAZINE (APRESOLINE) 50 MG tablet, TAKE ONE (1) TABLET THREE (3) TIMES EACH DAY, Disp: 270 tablet, Rfl: 1 .  isosorbide mononitrate (IMDUR) 30 MG 24 hr tablet, TAKE ONE TABLET EVERY MORNING, Disp: 90 tablet, Rfl: 1 .  loratadine (CLARITIN) 10 MG tablet, TAKE ONE (1) TABLET EACH DAY, Disp: 90 tablet, Rfl: 0 .  losartan (COZAAR) 100 MG tablet, TAKE ONE (1) TABLET EACH DAY, Disp: 90 tablet, Rfl: 1 .  metoprolol succinate (TOPROL-XL) 25 MG 24 hr tablet, TAKE ONE TABLET BY MOUTH TWICE DAILY (Patient taking differently: Take 25mg  by mouth once daily at night), Disp: 180 tablet, Rfl: 1 .  Multiple Vitamins-Minerals (PRESERVISION AREDS) TABS, Take 1 tablet by mouth 2 (two) times daily. , Disp: , Rfl:  .  nitroGLYCERIN (NITROSTAT) 0.4 MG SL tablet, Place 0.4 mg under the tongue every 5 (five) minutes as needed for chest pain. Reported on 04/26/2015, Disp: , Rfl:  .  polyethylene glycol powder (GLYCOLAX/MIRALAX) powder, MIX 17 GRAMS IN AT LEAST 8OZ OF WATER/JUICE AND DRINK ONCE OR TWICE DAILY (Patient taking  differently: Take 17 g by mouth daily. MIX 17 GRAMS IN AT LEAST 8OZ OF WATER/JUICE AND DRINK), Disp: , Rfl:  .  potassium chloride (K-DUR) 10 MEQ tablet, TAKE ONE (1) TABLET EACH DAY, Disp: 90 tablet, Rfl: 1 .  rosuvastatin (CRESTOR) 10 MG tablet, TAKE ONE (1) TABLET EACH DAY (Patient taking differently: Take 10 mgs once daily at night), Disp: 90 tablet, Rfl: 1 .  traZODone (DESYREL) 100 MG tablet, TAKE ONE TABLET DAILY AT BEDTIME, Disp: 90 tablet, Rfl: 1 .  Vitamin D, Ergocalciferol, (DRISDOL) 50000 units CAPS capsule, TAKE 1 CAPSULE EVERY WEEK ON TUESDAYS, Disp: 12 capsule, Rfl: 3 .  zolpidem (AMBIEN) 10 MG tablet, TAKE ONE (1) TABLET AT BEDTIME, Disp: 90 tablet, Rfl: 1 .  montelukast (SINGULAIR) 10 MG tablet, Take 1 tablet (10 mg total) by mouth at bedtime., Disp: 90 tablet, Rfl: 3  Continue all other maintenance medications as listed above.  Follow up plan: Return in about 3 months (around 01/15/2017) for recheck.  Educational handout given for Pier[place] PA-C [facility] [address]  [place], [place] [address] [phone]   10/15/2016, 8:33 AM

## 2016-10-15 NOTE — Patient Instructions (Signed)
In a few days you may receive a survey in the mail or online from [employer] regarding your visit with us today. Please take a moment to fill this out. Your feedback is very important to our whole office. It can help us better understand your needs as well as improve your experience and satisfaction. Thank you for taking your time to complete it. We care about you.  Mikayela Deats, PA-C  

## 2016-11-27 ENCOUNTER — Other Ambulatory Visit: Payer: Self-pay | Admitting: Physician Assistant

## 2016-11-29 NOTE — Telephone Encounter (Signed)
Phoned in.

## 2016-12-05 ENCOUNTER — Encounter: Payer: Self-pay | Admitting: General Surgery

## 2016-12-05 ENCOUNTER — Ambulatory Visit (INDEPENDENT_AMBULATORY_CARE_PROVIDER_SITE_OTHER): Payer: Medicare Other | Admitting: General Surgery

## 2016-12-05 VITALS — BP 168/70 | HR 49 | Temp 97.8°F | Resp 18 | Ht 62.0 in | Wt 136.0 lb

## 2016-12-05 DIAGNOSIS — K435 Parastomal hernia without obstruction or  gangrene: Secondary | ICD-10-CM | POA: Diagnosis not present

## 2016-12-05 NOTE — Progress Notes (Signed)
[name]; [id number]; 1934-06-05   HPI Patient is an 82 yo WF s/p Hartman's procedure in 2016 for perforated colon who self referred herself for evaluation and treatment of difficulty with applying ostomy bags and a parastomal hernia.  Has been present for some time.  Here with stomal therapy nurse.Apparently, her stoma has somewhat retracted and is not perfectly flush with the skin.  She is having a difficult time obtaining a seal when she applies the ostomy appliance.  The nurse states that she is having a difficult time also because she has visual difficulties and cannot see exactly how she is placing the appliance.  The patient also reveals that she has been taking MiraLAX twice a day and her stools are of a peanut butter consistency.  She currently has no pain.  Her ostomy is functioning well.  She just had a laparoscopic cholecystectomy by Dr. Lourdes Sledge surgical earlier this year. Past Medical History:  Diagnosis Date  . Anemia   . Anxiety state, unspecified   . Atrial fibrillation with RVR ([place])   . [name]'s esophagus   . Bilateral sciatica   . Cellulitis of extremity   . Chronic back pain   . Congestive heart failure, unspecified    diastolic heart failure  . Coronary artery disease    a. Moderate to severe coronary disease involving the left anterior descending artery and right coronary artery.  Sequential stenosis in the LAD is significant.  Right coronary artery is moderate to severely likely nonischemic. Questionable small LVOT obstruction.  No Brockenbrough maneuver was performed.  Catheterization January 2013; b. 05/2014 MV no isch/infarct, EF 60%.  . Depressive disorder, not elsewhere classified   . Diverticulitis   . Erosive esophagitis 08/04/2015  . Esophageal reflux   . Gastric volvulus 08/04/2015  . Gastritis   . H/O hiatal hernia   . Hypertension   . IBS (irritable bowel syndrome)   . Infection of esophagostomy ([place])   . Macular degeneration   . Melanoma  ([place])    removal on back   . Pelvic abscess in female   . Perforated bowel ([place])   . Perforated sigmoid colon ([place])   . PONV (postoperative nausea and vomiting)   . Pulmonary hypertension (HCC)    PA systolic pressure 95-09 mmHg by echocardiogram, PA pressure 33/10 by cardiac catheterization PA saturation 62% thermodilution cardiac index 2.0 thick cardiac index 2.4  . Pure hypercholesterolemia   . Tricuspid regurgitation    moderate tricuspid regurgitation by echocardiogram, prior use of anorexic agents    Past Surgical History:  Procedure Laterality Date  . CARDIAC CATHETERIZATION Left 03/05/2015   Procedure: CENTRAL LINE INSERTION;  Surgeon: Aviva Signs, MD;  Location: [facility];  Service: General;  Laterality: Left;  . CATARACT EXTRACTION W/ INTRAOCULAR LENS  IMPLANT, BILATERAL  ~ 2002  . CHOLECYSTECTOMY N/A 07/18/2016   Procedure: LAPAROSCOPIC CHOLECYSTECTOMY WITH INTRAOPERATIVE CHOLANGIOGRAM;  Surgeon: [name], MD;  [facility];  Service: General;  Laterality: N/A;  . COLECTOMY WITH COLOSTOMY CREATION/HARTMANN PROCEDURE N/A 03/05/2015   Procedure: PARTIAL COLECTOMY WITH COLOSTOMY CREATION/HARTMANN PROCEDURE;  Surgeon: Aviva Signs, MD;  Location: [facility];  Service: General;  Laterality: N/A;  . CORONARY ANGIOPLASTY WITH STENT PLACEMENT  05/08/11   "1"  . ESOPHAGEAL MANOMETRY N/A 06/06/2014   Procedure: ESOPHAGEAL MANOMETRY (EM);  Surgeon: Jerene Bears, MD;  [facility];  Service: Gastroenterology;  Laterality: N/A;  . ESOPHAGOGASTRODUODENOSCOPY (EGD) WITH PROPOFOL N/A 08/04/2015   Procedure: ESOPHAGOGASTRODUODENOSCOPY (EGD) WITH  PROPOFOL;  Surgeon: Mauri Pole, MD;  Location: [facility];  Service: Endoscopy;  Laterality: N/A;  . HERNIA REPAIR    . IR GENERIC HISTORICAL  03/04/2016   IR LUMBAR DISC ASPIRATION W/IMG GUIDE 03/04/2016 Luanne Bras, MD [facility]  . LAPAROSCOPIC NISSEN FUNDOPLICATION N/A 1/61/0960   Procedure: LAPAROSCOPIC  LYSIS OF  ADHESIONS, SEGMENTAL GASTRECTOMY, PARTIAL REDUCTION OF HERNIA;  Surgeon: [name], MD;  [facility];  Service: General;  Laterality: N/A;  . NISSEN FUNDOPLICATION  4540  . PERCUTANEOUS CORONARY STENT INTERVENTION (PCI-S) N/A 05/08/2011   Procedure: PERCUTANEOUS CORONARY STENT INTERVENTION (PCI-S);  Surgeon: Wellington Hampshire, MD;  Location: [facility] CATH LAB;  Service: Cardiovascular;  Laterality: N/A;  . ROTATOR CUFF REPAIR  ~ 2001   right  . SHOULDER ARTHROSCOPY  ~ 2004; 2005   left; "joint's wore out"  . TONSILLECTOMY  ~ 1944  . TUBAL LIGATION  1972    Family History  Problem Relation Age of Onset  . Heart attack Father   . Hypertension Father   . Diabetes Mother   . Hypertension Mother   . Skin cancer Brother        melanoma  . Colon cancer Neg Hx   . Esophageal cancer Neg Hx   . Rectal cancer Neg Hx   . Stomach cancer Neg Hx     Current Outpatient Prescriptions on File Prior to Visit  Medication Sig Dispense Refill  . albuterol (PROVENTIL HFA;VENTOLIN HFA) 108 (90 Base) MCG/ACT inhaler Inhale 2 puffs into the lungs every 6 (six) hours as needed for wheezing or shortness of breath.     . ALPRAZolam (XANAX) 0.25 MG tablet TAKE 1/2 TO 1 TABLET 3 TIMES DAILY AS NEEDED 270 tablet 0  . citalopram (CELEXA) 40 MG tablet TAKE ONE (1) TABLET EACH DAY 90 tablet 3  . esomeprazole (NEXIUM) 40 MG capsule TAKE ONE (1) CAPSULE EACH DAY 90 capsule 1  . ferrous sulfate 325 (65 FE) MG EC tablet Take 325 mg by mouth daily.     . fluticasone (FLONASE) 50 MCG/ACT nasal spray USE 2 SPRAYS IN EACH NOSTRIL DAILY 48 g 1  . furosemide (LASIX) 20 MG tablet TAKE ONE (1) TABLET EACH DAY 90 tablet 1  . hydrALAZINE (APRESOLINE) 50 MG tablet TAKE ONE (1) TABLET THREE (3) TIMES EACH DAY 270 tablet 1  . isosorbide mononitrate (IMDUR) 30 MG 24 hr tablet TAKE ONE TABLET EVERY MORNING 90 tablet 1  . loratadine (CLARITIN) 10 MG tablet TAKE ONE (1) TABLET EACH DAY 90 tablet 1  . losartan (COZAAR) 100 MG tablet  TAKE ONE (1) TABLET EACH DAY 90 tablet 1  . metoprolol succinate (TOPROL-XL) 25 MG 24 hr tablet TAKE ONE TABLET BY MOUTH TWICE DAILY 180 tablet 1  . montelukast (SINGULAIR) 10 MG tablet Take 1 tablet (10 mg total) by mouth at bedtime. 90 tablet 3  . Multiple Vitamins-Minerals (PRESERVISION AREDS) TABS Take 1 tablet by mouth 2 (two) times daily.     . nitroGLYCERIN (NITROSTAT) 0.4 MG SL tablet Place 0.4 mg under the tongue every 5 (five) minutes as needed for chest pain. Reported on 04/26/2015    . polyethylene glycol powder (GLYCOLAX/MIRALAX) powder MIX 17 GRAMS IN AT LEAST 8OZ OF WATER/JUICE AND DRINK ONCE OR TWICE DAILY (Patient taking differently: Take 17 g by mouth daily. [place] OF WATER/JUICE AND DRINK)    . potassium chloride (K-DUR) 10 MEQ tablet TAKE ONE (1) TABLET EACH DAY 90  tablet 1  . rosuvastatin (CRESTOR) 10 MG tablet TAKE ONE (1) TABLET EACH DAY 90 tablet 1  . traZODone (DESYREL) 100 MG tablet TAKE ONE TABLET DAILY AT BEDTIME 90 tablet 1  . Vitamin D, Ergocalciferol, (DRISDOL) 50000 units CAPS capsule TAKE 1 CAPSULE EVERY WEEK ON TUESDAYS 12 capsule 3  . zolpidem (AMBIEN) 10 MG tablet TAKE ONE (1) TABLET AT BEDTIME 90 tablet 1   No current facility-administered medications on file prior to visit.     Allergies  Allergen Reactions  . Sulfamethoxazole Rash    History  Alcohol Use No    History  Smoking Status  . Former Smoker  . Packs/day: 1.00  . Years: 20.00  . Types: Cigarettes  . Start date: 10/12/1958  . Quit date: 04/26/1988  Smokeless Tobacco  . Never Used    Review of Systems  Constitutional: Negative.   HENT: Positive for sinus pain.   Eyes: Negative.   Respiratory: Negative.   Cardiovascular: Negative.   Gastrointestinal: Positive for abdominal pain and nausea.  Genitourinary: Negative.   Musculoskeletal: Negative.   Skin: Negative.   Neurological: Negative.   Endo/Heme/Allergies: Negative.   Psychiatric/Behavioral: Negative.      Objective   Vitals:   12/05/16 0946  BP: (!) 168/70  Pulse: (!) 49  Resp: 18  Temp: 97.8 F (36.6 C)    Physical Exam  Constitutional: She is oriented to person, place, and time and well-developed, well-nourished, and in no distress.  HENT:  Head: Normocephalic and atraumatic.  Cardiovascular: Normal rate, regular rhythm and normal heart sounds.  Exam reveals no gallop and no friction rub.   No murmur heard. Pulmonary/Chest: Effort normal and breath sounds normal. No respiratory distress. She has no wheezes. She has no rales.  Abdominal: Soft. Bowel sounds are normal. She exhibits no distension. There is no tenderness. There is no rebound.  Large reducible parastomal hernia present.  Neurological: She is alert and oriented to person, place, and time.  Skin: Skin is warm and dry.  Vitals reviewed.   Assessment    Peristomal hernia, difficulty in applying ostomy appliance Plan    I did review the patient's records.  I told her that  There are multiple options in terms of treating this problem, and including possible take down the colostomy, repair and revision of the colostomy with parastomal hernia repair, or moving the colostomy to the other side of the abdomen.  Given her multiple previous surgeries and potential options for repair of the parastomal hernia, I told her that she needs referral back to Dr. Elinor Parkinson for evaluation and possible consultation with the colorectal surgeon to determine the best management for her.  She understands and agrees.  We will make referral back to him for further evaluation.  Given her multiple abdominal surgeries, they may be limited in what they could offer.

## 2016-12-30 ENCOUNTER — Telehealth: Payer: Self-pay | Admitting: Internal Medicine

## 2016-12-30 NOTE — Telephone Encounter (Signed)
I conversed with Dr. Zella Richer by staff message today She does need repeat colonoscopy and this should be followed up at [facility] given her last procedure/EMR done there Please contact [facility] to facilitate this followup procedure Thanks

## 2016-12-30 NOTE — Telephone Encounter (Signed)
Comment from March OV:  Addendum: Reviewed and agree with management. Repeat colonoscopy at [facility] after incomplete EMR at [facility] previously is strongly recommended Jerene Bears, MD  Pt calling today, states she was seen by Dr. Zella Richer today. Pt reports that she is wanting to have her colostomy reversed and that Dr. Zella Richer wants her to have a colonoscopy done first. Please advise.

## 2017-01-01 NOTE — Telephone Encounter (Signed)
Called to schedule colon at [facility] with Dr. Stephanie Acre. Was instructed to fax over orders for the colon and that they will contact the pt with the colon appt. Orders faxed to [phone].

## 2017-01-07 ENCOUNTER — Telehealth: Payer: Self-pay | Admitting: Physician Assistant

## 2017-01-07 MED ORDER — MECLIZINE HCL 25 MG PO TABS: 25.0000 mg | ORAL_TABLET | Freq: Three times a day (TID) | ORAL | 0 refills | Status: DC | PRN

## 2017-01-07 NOTE — Telephone Encounter (Signed)
What symptoms do you have? Dizzy head, makes her a little nauseas when she gets up   How long have you been sick? For about a month, trying to wait until next week, can something be called in to calm head down   Have you been seen for this problem? no  If your provider decides to give you a prescription, which pharmacy would you like for it to be sent to? [facility]   Patient informed that this information will be sent to the clinical staff for review and that they should receive a follow up call.

## 2017-01-08 NOTE — Telephone Encounter (Signed)
Aware , script was sent in. 

## 2017-01-10 ENCOUNTER — Other Ambulatory Visit: Payer: Self-pay | Admitting: Physician Assistant

## 2017-01-13 ENCOUNTER — Encounter: Payer: Self-pay | Admitting: Cardiovascular Disease

## 2017-01-13 ENCOUNTER — Ambulatory Visit (INDEPENDENT_AMBULATORY_CARE_PROVIDER_SITE_OTHER): Payer: Medicare Other | Admitting: Cardiovascular Disease

## 2017-01-13 VITALS — BP 140/60 | HR 49 | Ht 62.0 in | Wt 140.0 lb

## 2017-01-13 DIAGNOSIS — I10 Essential (primary) hypertension: Secondary | ICD-10-CM

## 2017-01-13 DIAGNOSIS — R001 Bradycardia, unspecified: Secondary | ICD-10-CM | POA: Diagnosis not present

## 2017-01-13 DIAGNOSIS — I480 Paroxysmal atrial fibrillation: Secondary | ICD-10-CM

## 2017-01-13 DIAGNOSIS — Z23 Encounter for immunization: Secondary | ICD-10-CM | POA: Diagnosis not present

## 2017-01-13 DIAGNOSIS — I5032 Chronic diastolic (congestive) heart failure: Secondary | ICD-10-CM

## 2017-01-13 DIAGNOSIS — Z9889 Other specified postprocedural states: Secondary | ICD-10-CM | POA: Diagnosis not present

## 2017-01-13 DIAGNOSIS — I25118 Atherosclerotic heart disease of native coronary artery with other forms of angina pectoris: Secondary | ICD-10-CM

## 2017-01-13 DIAGNOSIS — Z9861 Coronary angioplasty status: Secondary | ICD-10-CM

## 2017-01-13 MED ORDER — NITROGLYCERIN 0.4 MG SL SUBL
0.4000 mg | SUBLINGUAL_TABLET | SUBLINGUAL | 3 refills | Status: AC | PRN
Start: 2017-01-13 — End: ?

## 2017-01-13 NOTE — Patient Instructions (Signed)
Medication Instructions:  Your physician recommends that you continue on your current medications as directed. Please refer to the Current Medication list given to you today.  Labwork: NONE  Testing/Procedures: NONE  Follow-Up: Your physician wants you to follow-up in: 6 MONTHS WITH DR. KONESWARAN. You will receive a reminder letter in the mail two months in advance. If you don't receive a letter, please call our office to schedule the follow-up appointment.  Any Other Special Instructions Will Be Listed Below (If Applicable).  If you need a refill on your cardiac medications before your next appointment, please call your pharmacy. 

## 2017-01-13 NOTE — Progress Notes (Signed)
SUBJECTIVE: The patient presents for follow up of coronary artery disease, paroxysmal atrial fibrillation, chronic diastolic heart failure.  With respect to coronary artery disease, she is doing well and denies exertional chest pain. With respect to atrial fibrillation, she denies palpitations. With respect to chronic diastolic heart failure, she denies shortness of breath and leg swelling.  Her primary issues relate to the inability to keep her ostomy bag secured. She successfully underwent cholecystectomy earlier this year. She is going to [facility] in November to see her Psychologist, sport and exercise.     Review of Systems: As per "subjective", otherwise negative.  Allergies  Allergen Reactions  . Sulfamethoxazole Rash    Current Outpatient Prescriptions  Medication Sig Dispense Refill  . albuterol (PROVENTIL HFA;VENTOLIN HFA) 108 (90 Base) MCG/ACT inhaler Inhale 2 puffs into the lungs every 6 (six) hours as needed for wheezing or shortness of breath.     . ALPRAZolam (XANAX) 0.25 MG tablet TAKE 1/2 TO 1 TABLET 3 TIMES DAILY AS NEEDED 270 tablet 0  . citalopram (CELEXA) 40 MG tablet TAKE ONE (1) TABLET EACH DAY 90 tablet 3  . esomeprazole (NEXIUM) 40 MG capsule TAKE ONE (1) CAPSULE EACH DAY 90 capsule 1  . ferrous sulfate 325 (65 FE) MG EC tablet Take 325 mg by mouth daily.     . fluticasone (FLONASE) 50 MCG/ACT nasal spray USE 2 SPRAYS IN EACH NOSTRIL DAILY 48 g 1  . furosemide (LASIX) 20 MG tablet TAKE ONE (1) TABLET EACH DAY 90 tablet 1  . hydrALAZINE (APRESOLINE) 50 MG tablet TAKE ONE (1) TABLET THREE (3) TIMES EACH DAY 270 tablet 1  . isosorbide mononitrate (IMDUR) 30 MG 24 hr tablet TAKE ONE TABLET EVERY MORNING 90 tablet 1  . loratadine (CLARITIN) 10 MG tablet TAKE ONE (1) TABLET EACH DAY 90 tablet 1  . losartan (COZAAR) 100 MG tablet TAKE ONE (1) TABLET EACH DAY 90 tablet 1  . meclizine (ANTIVERT) 25 MG tablet Take 1 tablet (25 mg total) by mouth 3 (three) times daily as needed  for dizziness. 30 tablet 0  . metoprolol succinate (TOPROL-XL) 25 MG 24 hr tablet TAKE ONE TABLET BY MOUTH TWICE DAILY 180 tablet 1  . montelukast (SINGULAIR) 10 MG tablet Take 1 tablet (10 mg total) by mouth at bedtime. 90 tablet 3  . Multiple Vitamins-Minerals (PRESERVISION AREDS) TABS Take 1 tablet by mouth 2 (two) times daily.     . nitroGLYCERIN (NITROSTAT) 0.4 MG SL tablet Place 1 tablet (0.4 mg total) under the tongue every 5 (five) minutes as needed for chest pain. Reported on 04/26/2015 25 tablet 3  . polyethylene glycol powder (GLYCOLAX/MIRALAX) powder MIX 17 GRAMS IN AT LEAST 8OZ OF WATER/JUICE AND DRINK ONCE OR TWICE DAILY (Patient taking differently: Take 17 g by mouth daily. [place] OF WATER/JUICE AND DRINK)    . potassium chloride (K-DUR) 10 MEQ tablet TAKE ONE (1) TABLET EACH DAY 90 tablet 1  . rosuvastatin (CRESTOR) 10 MG tablet TAKE ONE (1) TABLET EACH DAY 90 tablet 1  . traZODone (DESYREL) 100 MG tablet TAKE ONE TABLET DAILY AT BEDTIME 90 tablet 1  . Vitamin D, Ergocalciferol, (DRISDOL) 50000 units CAPS capsule TAKE 1 CAPSULE EVERY WEEK ON TUESDAYS 12 capsule 3  . zolpidem (AMBIEN) 10 MG tablet TAKE ONE (1) TABLET AT BEDTIME 90 tablet 0   No current facility-administered medications for this visit.     Past Medical History:  Diagnosis Date  .  Anemia   . Anxiety state, unspecified   . Atrial fibrillation with RVR ([place])   . [name]'s esophagus   . Bilateral sciatica   . Cellulitis of extremity   . Chronic back pain   . Congestive heart failure, unspecified    diastolic heart failure  . Coronary artery disease    a. Moderate to severe coronary disease involving the left anterior descending artery and right coronary artery.  Sequential stenosis in the LAD is significant.  Right coronary artery is moderate to severely likely nonischemic. Questionable small LVOT obstruction.  No Brockenbrough maneuver was performed.  Catheterization January 2013; b. 05/2014  MV no isch/infarct, EF 60%.  . Depressive disorder, not elsewhere classified   . Diverticulitis   . Erosive esophagitis 08/04/2015  . Esophageal reflux   . Gastric volvulus 08/04/2015  . Gastritis   . H/O hiatal hernia   . Hypertension   . IBS (irritable bowel syndrome)   . Infection of esophagostomy ([place])   . Macular degeneration   . Melanoma ([place])    removal on back   . Pelvic abscess in female   . Perforated bowel ([place])   . Perforated sigmoid colon ([place])   . PONV (postoperative nausea and vomiting)   . Pulmonary hypertension (HCC)    PA systolic pressure 24-26 mmHg by echocardiogram, PA pressure 33/10 by cardiac catheterization PA saturation 62% thermodilution cardiac index 2.0 thick cardiac index 2.4  . Pure hypercholesterolemia   . Tricuspid regurgitation    moderate tricuspid regurgitation by echocardiogram, prior use of anorexic agents    Past Surgical History:  Procedure Laterality Date  . CARDIAC CATHETERIZATION Left 03/05/2015   Procedure: CENTRAL LINE INSERTION;  Surgeon: Aviva Signs, MD;  Location: [facility];  Service: General;  Laterality: Left;  . CATARACT EXTRACTION W/ INTRAOCULAR LENS  IMPLANT, BILATERAL  ~ 2002  . CHOLECYSTECTOMY N/A 07/18/2016   Procedure: LAPAROSCOPIC CHOLECYSTECTOMY WITH INTRAOPERATIVE CHOLANGIOGRAM;  Surgeon: [name], MD;  [facility];  Service: General;  Laterality: N/A;  . COLECTOMY WITH COLOSTOMY CREATION/HARTMANN PROCEDURE N/A 03/05/2015   Procedure: PARTIAL COLECTOMY WITH COLOSTOMY CREATION/HARTMANN PROCEDURE;  Surgeon: Aviva Signs, MD;  Location: [facility];  Service: General;  Laterality: N/A;  . CORONARY ANGIOPLASTY WITH STENT PLACEMENT  05/08/11   "1"  . ESOPHAGEAL MANOMETRY N/A 06/06/2014   Procedure: ESOPHAGEAL MANOMETRY (EM);  Surgeon: Jerene Bears, MD;  [facility];  Service: Gastroenterology;  Laterality: N/A;  . ESOPHAGOGASTRODUODENOSCOPY (EGD) WITH PROPOFOL N/A 08/04/2015   Procedure: ESOPHAGOGASTRODUODENOSCOPY (EGD)  WITH PROPOFOL;  Surgeon: Mauri Pole, MD;  [facility];  Service: Endoscopy;  Laterality: N/A;  . HERNIA REPAIR    . IR GENERIC HISTORICAL  03/04/2016   IR LUMBAR DISC ASPIRATION W/IMG GUIDE 03/04/2016 Luanne Bras, MD [facility]  . LAPAROSCOPIC NISSEN FUNDOPLICATION N/A 8/34/1962   Procedure: LAPAROSCOPIC  LYSIS OF ADHESIONS, SEGMENTAL GASTRECTOMY, PARTIAL REDUCTION OF HERNIA;  Surgeon: [name], MD;  [facility];  Service: General;  Laterality: N/A;  . NISSEN FUNDOPLICATION  2297  . PERCUTANEOUS CORONARY STENT INTERVENTION (PCI-S) N/A 05/08/2011   Procedure: PERCUTANEOUS CORONARY STENT INTERVENTION (PCI-S);  Surgeon: Wellington Hampshire, MD;  Location: [facility] CATH LAB;  Service: Cardiovascular;  Laterality: N/A;  . ROTATOR CUFF REPAIR  ~ 2001   right  . SHOULDER ARTHROSCOPY  ~ 2004; 2005   left; "joint's wore out"  . TONSILLECTOMY  ~ 1944  . TUBAL LIGATION  1972    Social History   Social History  .  Marital status: Divorced    Spouse name: N/A  . Number of children: 3  . Years of education: N/A   Occupational History  . Retired    Social History Main Topics  . Smoking status: Former Smoker    Packs/day: 1.00    Years: 20.00    Types: Cigarettes    Start date: 10/12/1958    Quit date: 04/26/1988  . Smokeless tobacco: Never Used  . Alcohol use No  . Drug use: No  . Sexual activity: No   Other Topics Concern  . Not on file   Social History Narrative   Divorced.  Lives alone.  Ambulates with a walker.      Vitals:   01/13/17 1402  BP: 140/60  Pulse: (!) 49  SpO2: 98%  Weight: 140 lb (63.5 kg)  Height: 5\' 2"  (1.575 m)    Wt Readings from Last 3 Encounters:  01/13/17 140 lb (63.5 kg)  12/05/16 136 lb (61.7 kg)  10/15/16 135 lb 3.2 oz (61.3 kg)     PHYSICAL EXAM General: NAD HEENT: Normal. Neck: No JVD, no thyromegaly. Lungs: Clear to auscultation bilaterally with normal respiratory effort. CV: Nondisplaced PMI.  Regular rate  and rhythm, normal S1/S2, no S3/S4, no murmur. No pretibial or periankle edema.  No carotid bruit.   Abdomen: Soft, no distention.  Neurologic: Alert and oriented.  Psych: Normal affect. Skin: Normal. Musculoskeletal: No gross deformities.    ECG: Most recent ECG reviewed.   Labs: Lab Results  Component Value Date/Time   K 4.4 07/15/2016 11:57 AM   BUN 18 07/15/2016 11:57 AM   BUN 16 12/18/2015 09:19 AM   CREATININE 1.34 (H) 07/15/2016 11:57 AM   ALT 6 (L) 07/15/2016 11:57 AM   TSH 3.744 08/05/2015 05:13 AM   TSH 3.494 10/23/2012 08:15 PM   HGB 9.9 (L) 08/13/2016 08:48 AM     Lipids: Lab Results  Component Value Date/Time   TRIG 32 08/29/2015 05:11 AM       ASSESSMENT AND PLAN:  1. Coronary artery disease with LAD stent: Symptomatically stable. Normal nuclear stress test in February 2016. She is not on aspirin due to high bleeding risk and history of anemia. I will continue long-acting nitrates, metoprolol succinate, and Crestor.  2. Hypertension: Mildly elevated. No changes.  3. Symptomatic bradycardia/paroxysmal atrial fibrillation: Stable on present dose of metoprolol. She is not on anticoagulation due to being a high bleeding risk, falls risk, and history of anemia. She was evaluated by Dr. Rayann Heman in the past and he felt she was not a candidate for the Watchman device although it could be considered in the future.  4. Chronic diastolic failure: Euvolemic. No changes to Lasix dose.     Disposition: Follow up 6 months   Kate Sable, M.D., F.A.C.C.

## 2017-01-13 NOTE — Telephone Encounter (Signed)
Phoned in.

## 2017-01-15 ENCOUNTER — Ambulatory Visit (INDEPENDENT_AMBULATORY_CARE_PROVIDER_SITE_OTHER): Payer: Medicare Other | Admitting: Physician Assistant

## 2017-01-15 ENCOUNTER — Encounter: Payer: Self-pay | Admitting: Physician Assistant

## 2017-01-15 VITALS — BP 156/74 | HR 53 | Temp 98.4°F | Ht 62.0 in | Wt 139.0 lb

## 2017-01-15 DIAGNOSIS — E611 Iron deficiency: Secondary | ICD-10-CM

## 2017-01-15 DIAGNOSIS — N183 Chronic kidney disease, stage 3 unspecified: Secondary | ICD-10-CM

## 2017-01-15 DIAGNOSIS — M5136 Other intervertebral disc degeneration, lumbar region: Secondary | ICD-10-CM | POA: Diagnosis not present

## 2017-01-15 DIAGNOSIS — I10 Essential (primary) hypertension: Secondary | ICD-10-CM | POA: Diagnosis not present

## 2017-01-15 DIAGNOSIS — K581 Irritable bowel syndrome with constipation: Secondary | ICD-10-CM

## 2017-01-15 NOTE — Progress Notes (Signed)
BP (!) 156/74   Pulse (!) 53   Temp 98.4 F (36.9 C) (Oral)   Ht _0  (1.575 m)   Wt 139 lb (63 kg)   BMI 25.42 kg/m    Subjective:    Patient ID: [name], female    DOB: [birth year], 82 y.o.   MRN: [id number]  HPI: [name] is a 82 y.o. female presenting on 01/15/2017 for Follow-up (3 month )  This patient comes in for periodic recheck on medications and conditions including Hypertension, IBS, degenerative disc disease, kidney disease, iron deficiency. She is going to [facility] to have a colonoscopy concerning her colostomy bag. They are looking forward to planning a surgery to reattach her colon. She states she has continued with difficulty keeping the colostomy bag attached. She is even worked with the colostomy nurse on trying to problem solve it.  Otherwise she is feeling well just generally tired. She has not had a CBC and iron checked in some time we will do that today. She has had some mild anemia in the past related to blood loss.   All medications are reviewed today. There are no reports of any problems with the medications. All of the medical conditions are reviewed and updated.  Lab work is reviewed and will be ordered as medically necessary. There are no new problems reported with today's visit.   Relevant past medical, surgical, family and social history reviewed and updated as indicated. Allergies and medications reviewed and updated.  Past Medical History:  Diagnosis Date  . Anemia   . Anxiety state, unspecified   . Atrial fibrillation with RVR ([place])   . [name]'s esophagus   . Bilateral sciatica   . Cellulitis of extremity   . Chronic back pain   . Congestive heart failure, unspecified    diastolic heart failure  . Coronary artery disease    a. Moderate to severe coronary disease involving the left anterior descending artery and right coronary artery.  Sequential stenosis in the LAD is significant.  Right coronary artery is moderate to severely likely  nonischemic. Questionable small LVOT obstruction.  No Brockenbrough maneuver was performed.  Catheterization January 2013; b. 05/2014 MV no isch/infarct, EF 60%.  . Depressive disorder, not elsewhere classified   . Diverticulitis   . Erosive esophagitis 08/04/2015  . Esophageal reflux   . Gastric volvulus 08/04/2015  . Gastritis   . H/O hiatal hernia   . Hypertension   . IBS (irritable bowel syndrome)   . Infection of esophagostomy ([place])   . Macular degeneration   . Melanoma ([place])    removal on back   . Pelvic abscess in female   . Perforated bowel ([place])   . Perforated sigmoid colon ([place])   . PONV (postoperative nausea and vomiting)   . Pulmonary hypertension (HCC)    PA systolic pressure 32-35 mmHg by echocardiogram, PA pressure 33/10 by cardiac catheterization PA saturation 62% thermodilution cardiac index 2.0 thick cardiac index 2.4  . Pure hypercholesterolemia   . Tricuspid regurgitation    moderate tricuspid regurgitation by echocardiogram, prior use of anorexic agents    Past Surgical History:  Procedure Laterality Date  . CARDIAC CATHETERIZATION Left 03/05/2015   Procedure: CENTRAL LINE INSERTION;  Surgeon: Aviva Signs, MD;  [facility]: [facility];  Service: General;  Laterality: Left;  . CATARACT EXTRACTION W/ INTRAOCULAR LENS  IMPLANT, BILATERAL  ~ 2002  . CHOLECYSTECTOMY N/A 07/18/2016   Procedure: LAPAROSCOPIC CHOLECYSTECTOMY WITH INTRAOPERATIVE CHOLANGIOGRAM;  Surgeon:  [name], MD;  [facility];  Service: General;  Laterality: N/A;  . COLECTOMY WITH COLOSTOMY CREATION/HARTMANN PROCEDURE N/A 03/05/2015   Procedure: PARTIAL COLECTOMY WITH COLOSTOMY CREATION/HARTMANN PROCEDURE;  Surgeon: Aviva Signs, MD;  [facility]: [facility];  Service: General;  Laterality: N/A;  . CORONARY ANGIOPLASTY WITH STENT PLACEMENT  05/08/11   "1"  . ESOPHAGEAL MANOMETRY N/A 06/06/2014   Procedure: ESOPHAGEAL MANOMETRY (EM);  Surgeon: Jerene Bears, MD;  [facility];  Service:  Gastroenterology;  Laterality: N/A;  . ESOPHAGOGASTRODUODENOSCOPY (EGD) WITH PROPOFOL N/A 08/04/2015   Procedure: ESOPHAGOGASTRODUODENOSCOPY (EGD) WITH PROPOFOL;  Surgeon: Mauri Pole, MD;  [facility];  Service: Endoscopy;  Laterality: N/A;  . HERNIA REPAIR    . IR GENERIC HISTORICAL  03/04/2016   IR LUMBAR DISC ASPIRATION W/IMG GUIDE 03/04/2016 Luanne Bras, MD [facility]  . LAPAROSCOPIC NISSEN FUNDOPLICATION N/A 0/09/6224   Procedure: LAPAROSCOPIC  LYSIS OF ADHESIONS, SEGMENTAL GASTRECTOMY, PARTIAL REDUCTION OF HERNIA;  Surgeon: [name], MD;  [facility];  Service: General;  Laterality: N/A;  . NISSEN FUNDOPLICATION  3335  . PERCUTANEOUS CORONARY STENT INTERVENTION (PCI-S) N/A 05/08/2011   Procedure: PERCUTANEOUS CORONARY STENT INTERVENTION (PCI-S);  Surgeon: Wellington Hampshire, MD;  Location: [facility] CATH LAB;  Service: Cardiovascular;  Laterality: N/A;  . ROTATOR CUFF REPAIR  ~ 2001   right  . SHOULDER ARTHROSCOPY  ~ 2004; 2005   left; "joint's wore out"  . TONSILLECTOMY  ~ 1944  . TUBAL LIGATION  1972    Review of Systems  Constitutional: Positive for fatigue. Negative for activity change, chills and fever.  HENT: Negative.   Eyes: Negative.   Respiratory: Negative.  Negative for cough.   Cardiovascular: Negative.  Negative for chest pain, palpitations and leg swelling.  Gastrointestinal: Negative.  Negative for abdominal pain.  Endocrine: Negative.   Genitourinary: Negative.  Negative for dysuria.  Musculoskeletal: Positive for back pain.  Skin: Negative.   Neurological: Negative.     Allergies as of 01/15/2017      Reactions   Sulfamethoxazole Rash      Medication List       Accurate as of 01/15/17 10:14 AM. Always use your most recent med list.          albuterol 108 (90 Base) MCG/ACT inhaler Commonly known as:  PROVENTIL HFA;VENTOLIN HFA Inhale 2 puffs into the lungs every 6 (six) hours as needed for wheezing or shortness of  breath.   ALPRAZolam 0.25 MG tablet Commonly known as:  XANAX TAKE 1/2 TO 1 TABLET 3 TIMES DAILY AS NEEDED   citalopram 40 MG tablet Commonly known as:  CELEXA TAKE ONE (1) TABLET EACH DAY   esomeprazole 40 MG capsule Commonly known as:  NEXIUM TAKE ONE (1) CAPSULE EACH DAY   ferrous sulfate 325 (65 FE) MG EC tablet Take 325 mg by mouth daily.   fluticasone 50 MCG/ACT nasal spray Commonly known as:  FLONASE USE 2 SPRAYS IN EACH NOSTRIL DAILY   furosemide 20 MG tablet Commonly known as:  LASIX TAKE ONE (1) TABLET EACH DAY   hydrALAZINE 50 MG tablet Commonly known as:  APRESOLINE TAKE ONE (1) TABLET THREE (3) TIMES EACH DAY   isosorbide mononitrate 30 MG 24 hr tablet Commonly known as:  IMDUR TAKE ONE TABLET EVERY MORNING   loratadine 10 MG tablet Commonly known as:  CLARITIN TAKE ONE (1) TABLET EACH DAY   losartan 100 MG tablet Commonly known as:  COZAAR TAKE ONE (1) TABLET EACH  DAY   meclizine 25 MG tablet Commonly known as:  ANTIVERT Take 1 tablet (25 mg total) by mouth 3 (three) times daily as needed for dizziness.   metoprolol succinate 25 MG 24 hr tablet Commonly known as:  TOPROL-XL TAKE ONE TABLET BY MOUTH TWICE DAILY   montelukast 10 MG tablet Commonly known as:  SINGULAIR Take 1 tablet (10 mg total) by mouth at bedtime.   nitroGLYCERIN 0.4 MG SL tablet Commonly known as:  NITROSTAT Place 1 tablet (0.4 mg total) under the tongue every 5 (five) minutes as needed for chest pain. Reported on 04/26/2015   polyethylene glycol powder powder Commonly known as:  GLYCOLAX/MIRALAX MIX 17 GRAMS IN AT LEAST 8OZ OF WATER/JUICE AND DRINK ONCE OR TWICE DAILY   potassium chloride 10 MEQ tablet Commonly known as:  K-DUR TAKE ONE (1) TABLET EACH DAY   PRESERVISION AREDS Tabs Take 1 tablet by mouth 2 (two) times daily.   rosuvastatin 10 MG tablet Commonly known as:  CRESTOR TAKE ONE (1) TABLET EACH DAY   traZODone 100 MG tablet Commonly known as:   DESYREL TAKE ONE TABLET DAILY AT BEDTIME   Vitamin D (Ergocalciferol) 50000 units Caps capsule Commonly known as:  DRISDOL TAKE 1 CAPSULE EVERY WEEK ON TUESDAYS   zolpidem 10 MG tablet Commonly known as:  AMBIEN TAKE ONE (1) TABLET AT BEDTIME          Objective:    BP (!) 156/74   Pulse (!) 53   Temp 98.4 F (36.9 C) (Oral)   Ht '5\' 2"'$  (1.575 m)   Wt 139 lb (63 kg)   BMI 25.42 kg/m   Allergies  Allergen Reactions  . Sulfamethoxazole Rash    Physical Exam  Constitutional: She is oriented to person, place, and time. She appears well-developed and well-nourished.  HENT:  Head: Normocephalic and atraumatic.  Eyes: Pupils are equal, round, and reactive to light. Conjunctivae and EOM are normal.  Cardiovascular: Normal rate, regular rhythm, normal heart sounds and intact distal pulses.   Pulmonary/Chest: Effort normal and breath sounds normal.  Abdominal: Soft. Bowel sounds are normal.  Neurological: She is alert and oriented to person, place, and time. She has normal reflexes.  Skin: Skin is warm and dry. No rash noted.  Psychiatric: She has a normal mood and affect. Her behavior is normal. Judgment and thought content normal.  Nursing note and vitals reviewed.   Results for orders placed or performed in visit on 08/13/16  CBC with Differential  Result Value Ref Range   WBC 4.2 3.4 - 10.8 x10E3/uL   RBC 3.96 3.77 - 5.28 x10E6/uL   Hemoglobin 9.9 (L) 11.1 - 15.9 g/dL   Hematocrit 31.6 (L) 34.0 - 46.6 %   MCV 80 79 - 97 fL   MCH 25.0 (L) 26.6 - 33.0 pg   MCHC 31.3 (L) 31.5 - 35.7 g/dL   RDW 17.6 (H) 12.3 - 15.4 %   Platelets 310 150 - 379 x10E3/uL   Neutrophils 46 Not Estab. %   Lymphs 39 Not Estab. %   Monocytes 6 Not Estab. %   Eos 8 Not Estab. %   Basos 1 Not Estab. %   Neutrophils Absolute 1.9 1.4 - 7.0 x10E3/uL   Lymphocytes Absolute 1.6 0.7 - 3.1 x10E3/uL   Monocytes Absolute 0.3 0.1 - 0.9 x10E3/uL   EOS (ABSOLUTE) 0.4 0.0 - 0.4 x10E3/uL   Basophils  Absolute 0.0 0.0 - 0.2 x10E3/uL   Immature Granulocytes 0 Not Estab. %  Immature Grans (Abs) 0.0 0.0 - 0.1 x10E3/uL      Assessment & Plan:   1. Essential hypertension - CBC with Differential/Platelet - CMP14+EGFR - Lipid panel - Thyroid Panel With TSH  2. Irritable bowel syndrome with constipation  3. DDD (degenerative disc disease), lumbar - CBC with Differential/Platelet - CMP14+EGFR  4. CKD (chronic kidney disease) stage 3, GFR 30-59 ml/min (HCC) - CBC with Differential/Platelet - CMP14+EGFR - Lipid panel - Thyroid Panel With TSH  5. Iron deficiency - Iron    Current Outpatient Prescriptions:  .  albuterol (PROVENTIL HFA;VENTOLIN HFA) 108 (90 Base) MCG/ACT inhaler, Inhale 2 puffs into the lungs every 6 (six) hours as needed for wheezing or shortness of breath. , Disp: , Rfl:  .  ALPRAZolam (XANAX) 0.25 MG tablet, TAKE 1/2 TO 1 TABLET 3 TIMES DAILY AS NEEDED, Disp: 270 tablet, Rfl: 0 .  citalopram (CELEXA) 40 MG tablet, TAKE ONE (1) TABLET EACH DAY, Disp: 90 tablet, Rfl: 3 .  esomeprazole (NEXIUM) 40 MG capsule, TAKE ONE (1) CAPSULE EACH DAY, Disp: 90 capsule, Rfl: 1 .  ferrous sulfate 325 (65 FE) MG EC tablet, Take 325 mg by mouth daily. , Disp: , Rfl:  .  fluticasone (FLONASE) 50 MCG/ACT nasal spray, USE 2 SPRAYS IN EACH NOSTRIL DAILY, Disp: 48 g, Rfl: 1 .  furosemide (LASIX) 20 MG tablet, TAKE ONE (1) TABLET EACH DAY, Disp: 90 tablet, Rfl: 1 .  hydrALAZINE (APRESOLINE) 50 MG tablet, TAKE ONE (1) TABLET THREE (3) TIMES EACH DAY, Disp: 270 tablet, Rfl: 1 .  isosorbide mononitrate (IMDUR) 30 MG 24 hr tablet, TAKE ONE TABLET EVERY MORNING, Disp: 90 tablet, Rfl: 1 .  loratadine (CLARITIN) 10 MG tablet, TAKE ONE (1) TABLET EACH DAY, Disp: 90 tablet, Rfl: 1 .  losartan (COZAAR) 100 MG tablet, TAKE ONE (1) TABLET EACH DAY, Disp: 90 tablet, Rfl: 1 .  meclizine (ANTIVERT) 25 MG tablet, Take 1 tablet (25 mg total) by mouth 3 (three) times daily as needed for dizziness., Disp: 30  tablet, Rfl: 0 .  metoprolol succinate (TOPROL-XL) 25 MG 24 hr tablet, TAKE ONE TABLET BY MOUTH TWICE DAILY, Disp: 180 tablet, Rfl: 1 .  montelukast (SINGULAIR) 10 MG tablet, Take 1 tablet (10 mg total) by mouth at bedtime., Disp: 90 tablet, Rfl: 3 .  Multiple Vitamins-Minerals (PRESERVISION AREDS) TABS, Take 1 tablet by mouth 2 (two) times daily. , Disp: , Rfl:  .  nitroGLYCERIN (NITROSTAT) 0.4 MG SL tablet, Place 1 tablet (0.4 mg total) under the tongue every 5 (five) minutes as needed for chest pain. Reported on 04/26/2015, Disp: 25 tablet, Rfl: 3 .  polyethylene glycol powder (GLYCOLAX/MIRALAX) powder, MIX 17 GRAMS IN AT LEAST 8OZ OF WATER/JUICE AND DRINK ONCE OR TWICE DAILY (Patient taking differently: Take 17 g by mouth daily. MIX 17 GRAMS IN AT LEAST 8OZ OF WATER/JUICE AND DRINK), Disp: , Rfl:  .  potassium chloride (K-DUR) 10 MEQ tablet, TAKE ONE (1) TABLET EACH DAY, Disp: 90 tablet, Rfl: 1 .  rosuvastatin (CRESTOR) 10 MG tablet, TAKE ONE (1) TABLET EACH DAY, Disp: 90 tablet, Rfl: 1 .  traZODone (DESYREL) 100 MG tablet, TAKE ONE TABLET DAILY AT BEDTIME, Disp: 90 tablet, Rfl: 1 .  Vitamin D, Ergocalciferol, (DRISDOL) 50000 units CAPS capsule, TAKE 1 CAPSULE EVERY WEEK ON TUESDAYS, Disp: 12 capsule, Rfl: 3 .  zolpidem (AMBIEN) 10 MG tablet, TAKE ONE (1) TABLET AT BEDTIME, Disp: 90 tablet, Rfl: 0 Continue all other maintenance medications as listed above.  Follow up plan: Return in about 3 months (around 04/17/2017) for recheck.  Educational handout given for [place] PA-C [place] [address]  [place], [place] [address] [phone]   01/15/2017, 10:14 AM

## 2017-01-15 NOTE — Patient Instructions (Signed)
In a few days you may receive a survey in the mail or online from [employer] regarding your visit with us today. Please take a moment to fill this out. Your feedback is very important to our whole office. It can help us better understand your needs as well as improve your experience and satisfaction. Thank you for taking your time to complete it. We care about you.  Gordana Kewley, PA-C  

## 2017-01-16 LAB — LIPID PANEL
CHOL/HDL RATIO: 3.7 ratio (ref 0.0–4.4)
CHOLESTEROL TOTAL: 166 mg/dL (ref 100–199)
HDL: 45 mg/dL (ref >39)
LDL CALC: 84 mg/dL (ref 0–99)
Triglycerides: 184 mg/dL — ABNORMAL HIGH (ref 0–149)
VLDL CHOLESTEROL CAL: 37 mg/dL (ref 5–40)

## 2017-01-16 LAB — CBC WITH DIFFERENTIAL/PLATELET
BASOS ABS: 0 10*3/uL (ref 0.0–0.2)
Basos: 0 %
EOS (ABSOLUTE): 0.2 10*3/uL (ref 0.0–0.4)
Eos: 3 %
HEMOGLOBIN: 10.9 g/dL — AB (ref 11.1–15.9)
Hematocrit: 32.8 % — ABNORMAL LOW (ref 34.0–46.6)
Immature Grans (Abs): 0 10*3/uL (ref 0.0–0.1)
Immature Granulocytes: 0 %
Lymphocytes Absolute: 1.6 10*3/uL (ref 0.7–3.1)
Lymphs: 25 %
MCH: 26.4 pg — AB (ref 26.6–33.0)
MCHC: 33.2 g/dL (ref 31.5–35.7)
MCV: 79 fL (ref 79–97)
Monocytes Absolute: 0.5 10*3/uL (ref 0.1–0.9)
Monocytes: 8 %
NEUTROS ABS: 4.1 10*3/uL (ref 1.4–7.0)
Neutrophils: 64 %
Platelets: 174 10*3/uL (ref 150–379)
RBC: 4.13 x10E6/uL (ref 3.77–5.28)
RDW: 16 % — ABNORMAL HIGH (ref 12.3–15.4)
WBC: 6.4 10*3/uL (ref 3.4–10.8)

## 2017-01-16 LAB — CMP14+EGFR
A/G RATIO: 2 (ref 1.2–2.2)
ALBUMIN: 4 g/dL (ref 3.5–4.7)
ALK PHOS: 64 IU/L (ref 39–117)
ALT: 9 IU/L (ref 0–32)
AST: 13 IU/L (ref 0–40)
BUN / CREAT RATIO: 11 — AB (ref 12–28)
BUN: 18 mg/dL (ref 8–27)
Bilirubin Total: 0.3 mg/dL (ref 0.0–1.2)
CO2: 24 mmol/L (ref 20–29)
Calcium: 9.2 mg/dL (ref 8.7–10.3)
Chloride: 101 mmol/L (ref 96–106)
Creatinine, Ser: 1.65 mg/dL — ABNORMAL HIGH (ref 0.57–1.00)
GFR calc Af Amer: 34 mL/min/{1.73_m2} — ABNORMAL LOW (ref >59)
GFR calc non Af Amer: 29 mL/min/{1.73_m2} — ABNORMAL LOW (ref >59)
GLUCOSE: 84 mg/dL (ref 65–99)
Globulin, Total: 2 g/dL (ref 1.5–4.5)
Potassium: 4.2 mmol/L (ref 3.5–5.2)
SODIUM: 140 mmol/L (ref 134–144)
Total Protein: 6 g/dL (ref 6.0–8.5)

## 2017-01-16 LAB — THYROID PANEL WITH TSH
Free Thyroxine Index: 1.4 (ref 1.2–4.9)
T3 Uptake Ratio: 24 % (ref 24–39)
T4, Total: 5.7 ug/dL (ref 4.5–12.0)
TSH: 5.72 u[IU]/mL — AB (ref 0.450–4.500)

## 2017-01-16 LAB — IRON: Iron: 48 ug/dL (ref 27–139)

## 2017-01-17 NOTE — Addendum Note (Signed)
Addended by: Thana Ates on: 01/17/2017 03:33 PM   Modules accepted: Orders

## 2017-01-21 ENCOUNTER — Encounter: Payer: Self-pay | Admitting: *Deleted

## 2017-01-21 ENCOUNTER — Ambulatory Visit (INDEPENDENT_AMBULATORY_CARE_PROVIDER_SITE_OTHER): Payer: Medicare Other | Admitting: *Deleted

## 2017-01-21 VITALS — BP 130/61 | HR 47 | Ht 62.5 in | Wt 142.0 lb

## 2017-01-21 DIAGNOSIS — Z0000 Encounter for general adult medical examination without abnormal findings: Secondary | ICD-10-CM

## 2017-01-21 NOTE — Progress Notes (Signed)
Subjective:   [name] is a 82 y.o. female who presents for an Initial Medicare Annual Wellness Visit. [name] is lives alone in an apartment. She has 3 adult children and several grandchildren. She is very active and enjoys exercising, reading, and playing the piano.   Review of Systems    Health is about the same as last year.   Cardiac Risk Factors include: advanced age (>24mn, >>8women);dyslipidemia;hypertension  Other systems negative    Objective:    Today's Vitals   01/21/17 1538  BP: 130/61  Pulse: (!) 47  Weight: 142 lb (64.4 kg)  Height: 5' 2.5" (1.588 m)   Body mass index is 25.56 kg/m.   Current Medications (verified) Outpatient Encounter Prescriptions as of 01/21/2017  Medication Sig  . albuterol (PROVENTIL HFA;VENTOLIN HFA) 108 (90 Base) MCG/ACT inhaler Inhale 2 puffs into the lungs every 6 (six) hours as needed for wheezing or shortness of breath.   . ALPRAZolam (XANAX) 0.25 MG tablet TAKE 1/2 TO 1 TABLET 3 TIMES DAILY AS NEEDED  . citalopram (CELEXA) 40 MG tablet TAKE ONE (1) TABLET EACH DAY  . esomeprazole (NEXIUM) 40 MG capsule TAKE ONE (1) CAPSULE EACH DAY  . ferrous sulfate 325 (65 FE) MG EC tablet Take 325 mg by mouth daily.   . fluticasone (FLONASE) 50 MCG/ACT nasal spray USE 2 SPRAYS IN EACH NOSTRIL DAILY  . furosemide (LASIX) 20 MG tablet TAKE ONE (1) TABLET EACH DAY  . hydrALAZINE (APRESOLINE) 50 MG tablet TAKE ONE (1) TABLET THREE (3) TIMES EACH DAY  . isosorbide mononitrate (IMDUR) 30 MG 24 hr tablet TAKE ONE TABLET EVERY MORNING  . loratadine (CLARITIN) 10 MG tablet TAKE ONE (1) TABLET EACH DAY  . losartan (COZAAR) 100 MG tablet TAKE ONE (1) TABLET EACH DAY  . meclizine (ANTIVERT) 25 MG tablet Take 1 tablet (25 mg total) by mouth 3 (three) times daily as needed for dizziness.  . metoprolol succinate (TOPROL-XL) 25 MG 24 hr tablet TAKE ONE TABLET BY MOUTH TWICE DAILY  . montelukast (SINGULAIR) 10 MG tablet Take 1 tablet (10 mg total) by mouth  at bedtime.  . Multiple Vitamins-Minerals (PRESERVISION AREDS) TABS Take 1 tablet by mouth 2 (two) times daily.   . nitroGLYCERIN (NITROSTAT) 0.4 MG SL tablet Place 1 tablet (0.4 mg total) under the tongue every 5 (five) minutes as needed for chest pain. Reported on 04/26/2015  . polyethylene glycol powder (GLYCOLAX/MIRALAX) powder MIX 17 GRAMS IN AT LEAST 8OZ OF WATER/JUICE AND DRINK ONCE OR TWICE DAILY (Patient taking differently: Take 17 g by mouth daily. MPeaseOF WATER/JUICE AND DRINK)  . potassium chloride (K-DUR) 10 MEQ tablet TAKE ONE (1) TABLET EACH DAY  . rosuvastatin (CRESTOR) 10 MG tablet TAKE ONE (1) TABLET EACH DAY  . traZODone (DESYREL) 100 MG tablet TAKE ONE TABLET DAILY AT BEDTIME  . Vitamin D, Ergocalciferol, (DRISDOL) 50000 units CAPS capsule TAKE 1 CAPSULE EVERY WEEK ON TUESDAYS  . zolpidem (AMBIEN) 10 MG tablet TAKE ONE (1) TABLET AT BEDTIME   No facility-administered encounter medications on file as of 01/21/2017.     Allergies (verified) Sulfamethoxazole   History: Past Medical History:  Diagnosis Date  . Anemia   . Anxiety state, unspecified   . Atrial fibrillation with RVR ([place]   . [name]'s esophagus   . Bilateral sciatica   . Cellulitis of extremity   . Chronic back pain   . Congestive heart failure, unspecified  diastolic heart failure  . Coronary artery disease    a. Moderate to severe coronary disease involving the left anterior descending artery and right coronary artery.  Sequential stenosis in the LAD is significant.  Right coronary artery is moderate to severely likely nonischemic. Questionable small LVOT obstruction.  No Brockenbrough maneuver was performed.  Catheterization January 2013; b. 05/2014 MV no isch/infarct, EF 60%.  . Depressive disorder, not elsewhere classified   . Diverticulitis   . Erosive esophagitis 08/04/2015  . Esophageal reflux   . Gastric volvulus 08/04/2015  . Gastritis   . H/O hiatal hernia   .  Hypertension   . IBS (irritable bowel syndrome)   . Infection of esophagostomy ([place])   . Macular degeneration   . Melanoma ([place])    removal on back   . Pelvic abscess in female   . Perforated bowel ([place])   . Perforated sigmoid colon ([place])   . PONV (postoperative nausea and vomiting)   . Pulmonary hypertension (HCC)    PA systolic pressure 28-31 mmHg by echocardiogram, PA pressure 33/10 by cardiac catheterization PA saturation 62% thermodilution cardiac index 2.0 thick cardiac index 2.4  . Pure hypercholesterolemia   . Tricuspid regurgitation    moderate tricuspid regurgitation by echocardiogram, prior use of anorexic agents   Past Surgical History:  Procedure Laterality Date  . CARDIAC CATHETERIZATION Left 03/05/2015   Procedure: CENTRAL LINE INSERTION;  Surgeon: Aviva Signs, MD;  Location: [facility];  Service: General;  Laterality: Left;  . CATARACT EXTRACTION W/ INTRAOCULAR LENS  IMPLANT, BILATERAL  ~ 2002  . CHOLECYSTECTOMY N/A 07/18/2016   Procedure: LAPAROSCOPIC CHOLECYSTECTOMY WITH INTRAOPERATIVE CHOLANGIOGRAM;  Surgeon: [name], MD;  Location: [facility];  Service: General;  Laterality: N/A;  . COLECTOMY WITH COLOSTOMY CREATION/HARTMANN PROCEDURE N/A 03/05/2015   Procedure: PARTIAL COLECTOMY WITH COLOSTOMY CREATION/HARTMANN PROCEDURE;  Surgeon: Aviva Signs, MD;  Location: [facility];  Service: General;  Laterality: N/A;  . CORONARY ANGIOPLASTY WITH STENT PLACEMENT  05/08/11   "1"  . ESOPHAGEAL MANOMETRY N/A 06/06/2014   Procedure: ESOPHAGEAL MANOMETRY (EM);  Surgeon: Jerene Bears, MD;  [facility];  Service: Gastroenterology;  Laterality: N/A;  . ESOPHAGOGASTRODUODENOSCOPY (EGD) WITH PROPOFOL N/A 08/04/2015   Procedure: ESOPHAGOGASTRODUODENOSCOPY (EGD) WITH PROPOFOL;  Surgeon: Mauri Pole, MD;  [facility];  Service: Endoscopy;  Laterality: N/A;  . HERNIA REPAIR    . IR GENERIC HISTORICAL  03/04/2016   IR LUMBAR DISC ASPIRATION W/IMG GUIDE 03/04/2016 Luanne Bras, MD [facility]  . LAPAROSCOPIC NISSEN FUNDOPLICATION N/A 08/16/6158   Procedure: LAPAROSCOPIC  LYSIS OF ADHESIONS, SEGMENTAL GASTRECTOMY, PARTIAL REDUCTION OF HERNIA;  Surgeon: [name], MD;  [facility];  Service: General;  Laterality: N/A;  . NISSEN FUNDOPLICATION  7371  . PERCUTANEOUS CORONARY STENT INTERVENTION (PCI-S) N/A 05/08/2011   Procedure: PERCUTANEOUS CORONARY STENT INTERVENTION (PCI-S);  Surgeon: Wellington Hampshire, MD;  Location: [facility] CATH LAB;  Service: Cardiovascular;  Laterality: N/A;  . ROTATOR CUFF REPAIR  ~ 2001   right  . SHOULDER ARTHROSCOPY  ~ 2004; 2005   left; "joint's wore out"  . TONSILLECTOMY  ~ 1944  . TUBAL LIGATION  1972   Family History  Problem Relation Age of Onset  . Heart attack Father   . Hypertension Father   . Diabetes Mother   . Hypertension Mother   . Skin cancer Brother        melanoma  . Atrial fibrillation Sister   . Skin cancer Sister   .  Colon cancer Neg Hx   . Esophageal cancer Neg Hx   . Rectal cancer Neg Hx   . Stomach cancer Neg Hx    Social History   Occupational History  . Retired    Social History Main Topics  . Smoking status: Former Smoker    Packs/day: 1.00    Years: 20.00    Types: Cigarettes    Start date: 10/12/1958    Quit date: 04/26/1988  . Smokeless tobacco: Never Used  . Alcohol use No  . Drug use: No  . Sexual activity: No    Tobacco Counseling No tobacco use  Activities of Daily Living In your present state of health, do you have any difficulty performing the following activities: 01/21/2017 07/18/2016  Hearing? N N  Vision? N N  Difficulty concentrating or making decisions? N N  Walking or climbing stairs? N N  Dressing or bathing? N N  Doing errands, shopping? N N  Preparing Food and eating ? N -  Using the Toilet? N -  In the past six months, have you accidently leaked urine? N -  Do you have problems with loss of bowel control? N -  Comment colostomy -  Managing your  Medications? N -  Comment Keeps them in a pill box -  Managing your Finances? N -  Housekeeping or managing your Housekeeping? N -  Some recent data might be hidden    Immunizations and Health Maintenance Immunization History  Administered Date(s) Administered  . Influenza Whole 01/08/2014  . Influenza,inj,Quad PF,6+ Mos 12/18/2015, 01/13/2017  . Influenza-Unspecified 12/30/2012  . Pneumococcal Conjugate-13 02/08/2014  . Pneumococcal Polysaccharide-23 06/08/2004   Health Maintenance Due  Topic Date Due  . HEMOGLOBIN A1C  [birth year]  . FOOT EXAM  10/12/1944  . OPHTHALMOLOGY EXAM  10/12/1944  . DEXA SCAN  10/11/2001    Patient Care Team: [name] as PCP - General ([place]) Okey Regal, [place] (Optometry) Herminio Commons, MD as Attending Physician (Cardiology)  No hospitalizations, ER visits, or surgeries this past year     Assessment:   This is a routine wellness examination for Alanni.   Hearing/Vision screen No deficits noted during visit. Eye exam is up to date.   Dietary issues and exercise activities discussed: Current Exercise Habits: Structured exercise class, Type of exercise: strength training/weights;Other - see comments (Tai Chi), Time (Minutes): 60, Frequency (Times/Week): 5, Weekly Exercise (Minutes/Week): 300, Intensity: Moderate, Exercise limited by: None identified  Goals    . Exercise 150 minutes per week (moderate activity)      Depression Screen PHQ 2/9 Scores 01/21/2017 01/15/2017 10/15/2016 08/23/2016 08/13/2016 05/13/2016 03/11/2016  PHQ - 2 Score 0 0 0 0 0 0 0    Fall Risk Fall Risk  01/21/2017 01/15/2017 10/15/2016 08/23/2016 05/13/2016  Falls in the past year? _0     Cognitive Function: MMSE - Mini Mental State Exam 01/21/2017  Orientation to time 5  Orientation to Place 5  Registration 3  Attention/ Calculation 5  Recall 3  Language- name 2 objects 2  Language- repeat 1  Language- follow 3 step command 3    Language- read & follow direction 1  Write a sentence 1  Copy design 0  Total score 29        Screening Tests Health Maintenance  Topic Date Due  . HEMOGLOBIN A1C  1934/12/06  . FOOT EXAM  10/12/1944  . OPHTHALMOLOGY EXAM  10/12/1946  . DEXA SCAN  10/11/2001  .  TETANUS/TDAP  01/22/2023  . INFLUENZA VACCINE  Completed  . PNA vac Low Risk Adult  Completed      Plan:  Continue to stay active and aim for at least 150 min of moderate activity a week.  Move carefully to avoid falls.  Keep f/u with PCP   I have personally reviewed and noted the following in the patient's chart:   . Medical and social history . Use of alcohol, tobacco or illicit drugs  . Current medications and supplements . Functional ability and status . Nutritional status . Physical activity . Advanced directives . List of other physicians . Hospitalizations, surgeries, and ER visits in previous 12 months . Vitals . Screenings to include cognitive, depression, and falls . Referrals and appointments  In addition, I have reviewed and discussed with patient certain preventive protocols, quality metrics, and best practice recommendations. A written personalized care plan for preventive services as well as general preventive health recommendations were provided to patient.     Chong Sicilian, RN   01/21/2017    I have reviewed and agree with the above AWV documentation.   Terald Sleeper PA-C [place] [address]  [place], [place] [address] [phone]

## 2017-01-21 NOTE — Patient Instructions (Signed)
  [name] , Thank you for taking time to come for your Medicare Wellness Visit. I appreciate your ongoing commitment to your health goals. Please review the following plan we discussed and let me know if I can assist you in the future.   These are the goals we discussed: Goals    . Exercise 150 minutes per week (moderate activity)       This is a list of the screening recommended for you and due dates:  Health Maintenance  Topic Date Due  . Hemoglobin A1C  [birth year]  . Complete foot exam   10[birth year]12/1944  . Eye exam for diabetics  10[birth year]12/1944  . DEXA scan (bone density measurement)  10/11/2001  . Tetanus Vaccine  01/22/2023  . Flu Shot  Completed  . Pneumonia vaccines  Completed

## 2017-02-18 ENCOUNTER — Other Ambulatory Visit: Payer: Self-pay | Admitting: Physician Assistant

## 2017-02-24 ENCOUNTER — Other Ambulatory Visit (HOSPITAL_COMMUNITY): Payer: Self-pay | Admitting: Nephrology

## 2017-02-24 DIAGNOSIS — N183 Chronic kidney disease, stage 3 unspecified: Secondary | ICD-10-CM

## 2017-03-06 ENCOUNTER — Other Ambulatory Visit: Payer: Self-pay | Admitting: General Surgery

## 2017-03-06 DIAGNOSIS — K435 Parastomal hernia without obstruction or  gangrene: Secondary | ICD-10-CM

## 2017-03-11 ENCOUNTER — Ambulatory Visit (HOSPITAL_COMMUNITY): Payer: Medicare Other

## 2017-03-13 ENCOUNTER — Ambulatory Visit
Admission: RE | Admit: 2017-03-13 | Discharge: 2017-03-13 | Disposition: A | Discharge status: Home / Self Care | Payer: Medicare Other | Source: Ambulatory Visit | Attending: General Surgery | Admitting: General Surgery

## 2017-03-13 DIAGNOSIS — K435 Parastomal hernia without obstruction or  gangrene: Secondary | ICD-10-CM

## 2017-03-13 MED ORDER — IOPAMIDOL (ISOVUE-300) INJECTION 61%
100.0000 mL | Freq: Once | INTRAVENOUS | Status: AC | PRN
  Administered 2017-03-13: 100 mL via INTRAVENOUS

## 2017-03-18 ENCOUNTER — Ambulatory Visit (HOSPITAL_COMMUNITY)
Admission: RE | Admit: 2017-03-18 | Discharge: 2017-03-18 | Disposition: A | Discharge status: Home / Self Care | Payer: Medicare Other | Source: Ambulatory Visit | Attending: Nephrology | Admitting: Nephrology

## 2017-03-18 DIAGNOSIS — N261 Atrophy of kidney (terminal): Secondary | ICD-10-CM | POA: Diagnosis not present

## 2017-03-18 DIAGNOSIS — N183 Chronic kidney disease, stage 3 unspecified: Secondary | ICD-10-CM

## 2017-03-18 DIAGNOSIS — N281 Cyst of kidney, acquired: Secondary | ICD-10-CM | POA: Insufficient documentation

## 2017-04-01 DIAGNOSIS — C189 Malignant neoplasm of colon, unspecified: Secondary | ICD-10-CM

## 2017-04-01 HISTORY — PX: HERNIA REPAIR: SHX51

## 2017-04-01 HISTORY — DX: Malignant neoplasm of colon, unspecified: C18.9

## 2017-04-14 ENCOUNTER — Other Ambulatory Visit: Payer: Self-pay | Admitting: Physician Assistant

## 2017-04-14 ENCOUNTER — Ambulatory Visit: Payer: Self-pay | Admitting: General Surgery

## 2017-04-14 DIAGNOSIS — I10 Essential (primary) hypertension: Secondary | ICD-10-CM

## 2017-04-14 NOTE — H&P (View-Only) (Signed)
History of Present Illness Leighton Ruff MD; 10/08/238 1:38 PM) The patient is a 83 year old female who presents for an evaluation of a hernia. 83 year old female who presents to the office for evaluation of a parastomal hernia as well as a endoscopically unresectable colon polyp. She has underwent 2 attempted endoscopic resections of her colon polyp during the last year at [facility]. The last colonoscopy was unsuccessful due to inability to lift the polyp. Report does state area is tattooed. The patient also notes having a parastomal hernia and difficulty pouching her colostomy due to this. She has refused any outside ostomy RN apts. Past Medical History:  Diagnosis Date  . Anemia   . Anxiety state, unspecified   . Atrial fibrillation with RVR ([place])   . [name]'s esophagus   . Bilateral sciatica   . Cellulitis of extremity   . Chronic back pain   . Congestive heart failure, unspecified    diastolic heart failure  . Coronary artery disease    a. Moderate to severe coronary disease involving the left anterior descending artery and right coronary artery.  Sequential stenosis in the LAD is significant.  Right coronary artery is moderate to severely likely nonischemic. Questionable small LVOT obstruction.  No Brockenbrough maneuver was performed.  Catheterization January 2013; b. 05/2014 MV no isch/infarct, EF 60%.  . Depressive disorder, not elsewhere classified   . Diverticulitis   . Erosive esophagitis 08/04/2015  . Esophageal reflux   . Gastric volvulus 08/04/2015  . Gastritis   . H/O hiatal hernia   . Hypertension   . IBS (irritable bowel syndrome)   . Infection of esophagostomy ([place])   . Macular degeneration   . Melanoma ([place])    removal on back   . Pelvic abscess in female   . Perforated bowel ([place])   . Perforated sigmoid colon ([place])   . PONV (postoperative nausea and vomiting)   . Pulmonary hypertension (HCC)    PA systolic pressure 97-35 mmHg by echocardiogram, PA pressure  33/10 by cardiac catheterization PA saturation 62% thermodilution cardiac index 2.0 thick cardiac index 2.4  . Pure hypercholesterolemia   . Tricuspid regurgitation    moderate tricuspid regurgitation by echocardiogram, prior use of anorexic agents   Past Surgical History:  Procedure Laterality Date  . CARDIAC CATHETERIZATION Left 03/05/2015   Procedure: CENTRAL LINE INSERTION;  Surgeon: Aviva Signs, MD;  Location: [facility];  Service: General;  Laterality: Left;  . CATARACT EXTRACTION W/ INTRAOCULAR LENS  IMPLANT, BILATERAL  ~ 2002  . CHOLECYSTECTOMY N/A 07/18/2016   Procedure: LAPAROSCOPIC CHOLECYSTECTOMY WITH INTRAOPERATIVE CHOLANGIOGRAM;  Surgeon: [name], MD;  [facility];  Service: General;  Laterality: N/A;  . COLECTOMY WITH COLOSTOMY CREATION/HARTMANN PROCEDURE N/A 03/05/2015   Procedure: PARTIAL COLECTOMY WITH COLOSTOMY CREATION/HARTMANN PROCEDURE;  Surgeon: Aviva Signs, MD;  Location: [facility];  Service: General;  Laterality: N/A;  . CORONARY ANGIOPLASTY WITH STENT PLACEMENT  05/08/11   "1"  . ESOPHAGEAL MANOMETRY N/A 06/06/2014   Procedure: ESOPHAGEAL MANOMETRY (EM);  Surgeon: Jerene Bears, MD;  [facility];  Service: Gastroenterology;  Laterality: N/A;  . ESOPHAGOGASTRODUODENOSCOPY (EGD) WITH PROPOFOL N/A 08/04/2015   Procedure: ESOPHAGOGASTRODUODENOSCOPY (EGD) WITH PROPOFOL;  Surgeon: Mauri Pole, MD;  [facility];  Service: Endoscopy;  Laterality: N/A;  . HERNIA REPAIR    . IR GENERIC HISTORICAL  03/04/2016   IR LUMBAR DISC ASPIRATION W/IMG GUIDE 03/04/2016 Luanne Bras, MD [facility]  . LAPAROSCOPIC NISSEN FUNDOPLICATION N/A 06/27/9240  Procedure: LAPAROSCOPIC  LYSIS OF ADHESIONS, SEGMENTAL GASTRECTOMY, PARTIAL REDUCTION OF HERNIA;  Surgeon: [name], MD;  [facility];  Service: General;  Laterality: N/A;  . NISSEN FUNDOPLICATION  0086  . PERCUTANEOUS CORONARY STENT INTERVENTION (PCI-S) N/A 05/08/2011   Procedure: PERCUTANEOUS  CORONARY STENT INTERVENTION (PCI-S);  Surgeon: Wellington Hampshire, MD;  Location: [facility] CATH LAB;  Service: Cardiovascular;  Laterality: N/A;  . ROTATOR CUFF REPAIR  ~ 2001   right  . SHOULDER ARTHROSCOPY  ~ 2004; 2005   left; "joint's wore out"  . TONSILLECTOMY  ~ 1944  . TUBAL LIGATION  1972   Social History   Socioeconomic History  . Marital status: Divorced    Spouse name: Not on file  . Number of children: 3  . Years of education: Not on file  . Highest education level: Not on file  Social Needs  . Financial resource strain: Not on file  . Food insecurity - worry: Not on file  . Food insecurity - inability: Not on file  . Transportation needs - medical: Not on file  . Transportation needs - non-medical: Not on file  Occupational History  . Occupation: Retired  Tobacco Use  . Smoking status: Former Smoker    Packs/day: 1.00    Years: 20.00    Pack years: 20.00    Types: Cigarettes    Start date: 10/12/1958    Last attempt to quit: 04/26/1988    Years since quitting: 28.9  . Smokeless tobacco: Never Used  Substance and Sexual Activity  . Alcohol use: No    Alcohol/week: 0.0 oz  . Drug use: No  . Sexual activity: No  Other Topics Concern  . Not on file  Social History Narrative   Divorced.  Lives alone.  Ambulates with a walker.    Family History  Problem Relation Age of Onset  . Heart attack Father   . Hypertension Father   . Diabetes Mother   . Hypertension Mother   . Skin cancer Brother        melanoma  . Atrial fibrillation Sister   . Skin cancer Sister   . Colon cancer Neg Hx   . Esophageal cancer Neg Hx   . Rectal cancer Neg Hx   . Stomach cancer Neg Hx    Review of Systems - General ROS: negative for - chills or fever Respiratory ROS: no cough, shortness of breath, or wheezing Cardiovascular ROS: no chest pain or dyspnea on exertion Gastrointestinal ROS: no abdominal pain, change in bowel habits, or black or bloody stools   Allergies  Allergen  Reactions  . Sulfamethoxazole Rash     Medication History (Armen Eulas Post, CMA; 04/04/2017 12:02 PM) Metoprolol Succinate ER (25MG  Tablet ER 24HR, Oral) Active. MiraLax (Oral) Active. Potassium Chloride (10MEQ Capsule ER, Oral) Active. TraZODone HCl (100MG  Tablet, Oral) Active. CeleXA (20MG  Tablet, Oral) Active. Nitroglycerin (0.4MG  Tab Sublingual, Sublingual) Active. Albuterol Sulfate ER (8MG  Tablet ER 12HR, Oral) Active. AmLODIPine Besylate (10MG  Tablet, Oral) Active. Claritin (10MG  Tablet, Oral) Active. Multivitamins (Oral) Active. Ferrous Sulfate (325 (65 Fe)MG Tablet, Oral) Active. Xanax (0.25MG  Tablet, Oral) Active. NexIUM (40MG  Capsule DR, Oral) Active. Losartan Potassium (100MG  Tablet, Oral) Active. Vitamin D3 (5000UNIT Tablet Chewable, Oral) Active. Zolpidem Tartrate (10MG  Tablet, Oral) Active. AmLODIPine Besylate (5MG  Tablet, Oral) Active. Fluticasone Propionate (50MCG/ACT Suspension, Nasal) Active. Medications Reconciled  Vitals (Armen Glenn CMA; 04/04/2017 11:57 AM) 04/04/2017 11:57 AM Weight: 146 lb Height: 62in Body Surface Area: 1.67 m Body Mass Index: 26.7 kg/m  Temp.: 58F  Pulse: 86 (Regular)  P.OX: 98% (Room air) BP: 130/60 (Sitting, Left Arm, Standard)       Physical Exam Leighton Ruff MD; 09/04/6810 1:38 PM) General Mental Status-Alert. General Appearance-Cooperative. CV: RRR Lungs: CTA Abdomen Palpation/Percussion Palpation and Percussion of the abdomen reveal - Soft and Non Tender.    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 I have once again offered her an appointment with an ostomy nurse for evaluation. She has refused this many times in the past it appears.  We can try a primary repair or possibly cover with a biologic mesh for a sugarbaker repair.  I do not think it is a good idea to reverse her  ostomy at this time.   ADENOMATOUS POLYP OF ASCENDING COLON (D12.2) Impression: I think this could be surgically resected. I have discussed this in detail. Given her previous surgical history, she is at risk for bleeding and damage to adjacent structures during surgery. We also discussed trying to fix her parastomal hernia at the same time. I think we could attempt a primary repair with possibly some biologic mesh overlay. This will require a major dissection and has a significant chance of bleeding. I do not think that she should undergo a colostomy reversal at the same time. I offered her a second opinion at [facility] as another option. In the meantime we will proceed with cardiac clearance for the above procedure.  The surgery and anatomy were described to the patient as well as the risks of surgery and the possible complications.  These include: Bleeding, deep abdominal infections and possible wound complications such as hernia and infection, damage to adjacent structures, leak of surgical connections, which can lead to other surgeries and possibly an ostomy, possible need for other procedures, such as abscess drains in radiology, possible prolonged hospital stay, possible diarrhea from removal of part of the colon, possible constipation from narcotics, prolonged fatigue/weakness or appetite loss, possible early recurrence of of disease, possible complications of their medical problems such as heart disease or arrhythmias or lung problems, death (less than 1%). I believe the patient understands and wishes to proceed with the surgery.

## 2017-04-14 NOTE — H&P (Signed)
History of Present Illness Heather Ruff MD; 06/03/7423 1:38 PM) The patient is a 83 year old female who presents for an evaluation of a hernia. 83 year old female who presents to the office for evaluation of a parastomal hernia as well as a endoscopically unresectable colon polyp. She has underwent 2 attempted endoscopic resections of her colon polyp during the last year at [facility]. The last colonoscopy was unsuccessful due to inability to lift the polyp. Report does state area is tattooed. The patient also notes having a parastomal hernia and difficulty pouching her colostomy due to this. She has refused any outside ostomy RN apts. Past Medical History:  Diagnosis Date  . Anemia   . Anxiety state, unspecified   . Atrial fibrillation with RVR ([place])   . [name]'s esophagus   . Bilateral sciatica   . Cellulitis of extremity   . Chronic back pain   . Congestive heart failure, unspecified    diastolic heart failure  . Coronary artery disease    a. Moderate to severe coronary disease involving the left anterior descending artery and right coronary artery.  Sequential stenosis in the LAD is significant.  Right coronary artery is moderate to severely likely nonischemic. Questionable small LVOT obstruction.  No Brockenbrough maneuver was performed.  Catheterization January 2013; b. 05/2014 MV no isch/infarct, EF 60%.  . Depressive disorder, not elsewhere classified   . Diverticulitis   . Erosive esophagitis 08/04/2015  . Esophageal reflux   . Gastric volvulus 08/04/2015  . Gastritis   . H/O hiatal hernia   . Hypertension   . IBS (irritable bowel syndrome)   . Infection of esophagostomy ([place])   . Macular degeneration   . Melanoma ([place])    removal on back   . Pelvic abscess in female   . Perforated bowel ([place])   . Perforated sigmoid colon ([place])   . PONV (postoperative nausea and vomiting)   . Pulmonary hypertension (HCC)    PA systolic pressure 95-63 mmHg by echocardiogram, PA pressure  33/10 by cardiac catheterization PA saturation 62% thermodilution cardiac index 2.0 thick cardiac index 2.4  . Pure hypercholesterolemia   . Tricuspid regurgitation    moderate tricuspid regurgitation by echocardiogram, prior use of anorexic agents   Past Surgical History:  Procedure Laterality Date  . CARDIAC CATHETERIZATION Left 03/05/2015   Procedure: CENTRAL LINE INSERTION;  Surgeon: Aviva Signs, MD;  Location: [facility];  Service: General;  Laterality: Left;  . CATARACT EXTRACTION W/ INTRAOCULAR LENS  IMPLANT, BILATERAL  ~ 2002  . CHOLECYSTECTOMY N/A 07/18/2016   Procedure: LAPAROSCOPIC CHOLECYSTECTOMY WITH INTRAOPERATIVE CHOLANGIOGRAM;  Surgeon: [name], MD;  [facility]: [facility];  Service: General;  Laterality: N/A;  . COLECTOMY WITH COLOSTOMY CREATION/HARTMANN PROCEDURE N/A 03/05/2015   Procedure: PARTIAL COLECTOMY WITH COLOSTOMY CREATION/HARTMANN PROCEDURE;  Surgeon: Aviva Signs, MD;  Location: [facility];  Service: General;  Laterality: N/A;  . CORONARY ANGIOPLASTY WITH STENT PLACEMENT  05/08/11   "1"  . ESOPHAGEAL MANOMETRY N/A 06/06/2014   Procedure: ESOPHAGEAL MANOMETRY (EM);  Surgeon: Jerene Bears, MD;  [facility];  Service: Gastroenterology;  Laterality: N/A;  . ESOPHAGOGASTRODUODENOSCOPY (EGD) WITH PROPOFOL N/A 08/04/2015   Procedure: ESOPHAGOGASTRODUODENOSCOPY (EGD) WITH PROPOFOL;  Surgeon: Mauri Pole, MD;  [facility];  Service: Endoscopy;  Laterality: N/A;  . HERNIA REPAIR    . IR GENERIC HISTORICAL  03/04/2016   IR LUMBAR DISC ASPIRATION W/IMG GUIDE 03/04/2016 Luanne Bras, MD [facility]  . LAPAROSCOPIC NISSEN FUNDOPLICATION N/A 8/75/6433  Procedure: LAPAROSCOPIC  LYSIS OF ADHESIONS, SEGMENTAL GASTRECTOMY, PARTIAL REDUCTION OF HERNIA;  Surgeon: [name], MD;  [facility];  Service: General;  Laterality: N/A;  . NISSEN FUNDOPLICATION  9326  . PERCUTANEOUS CORONARY STENT INTERVENTION (PCI-S) N/A 05/08/2011   Procedure: PERCUTANEOUS  CORONARY STENT INTERVENTION (PCI-S);  Surgeon: Wellington Hampshire, MD;  Location: [facility] CATH LAB;  Service: Cardiovascular;  Laterality: N/A;  . ROTATOR CUFF REPAIR  ~ 2001   right  . SHOULDER ARTHROSCOPY  ~ 2004; 2005   left; "joint's wore out"  . TONSILLECTOMY  ~ 1944  . TUBAL LIGATION  1972   Social History   Socioeconomic History  . Marital status: Divorced    Spouse name: Not on file  . Number of children: 3  . Years of education: Not on file  . Highest education level: Not on file  Social Needs  . Financial resource strain: Not on file  . Food insecurity - worry: Not on file  . Food insecurity - inability: Not on file  . Transportation needs - medical: Not on file  . Transportation needs - non-medical: Not on file  Occupational History  . Occupation: Retired  Tobacco Use  . Smoking status: Former Smoker    Packs/day: 1.00    Years: 20.00    Pack years: 20.00    Types: Cigarettes    Start date: 10/12/1958    Last attempt to quit: 04/26/1988    Years since quitting: 28.9  . Smokeless tobacco: Never Used  Substance and Sexual Activity  . Alcohol use: No    Alcohol/week: 0.0 oz  . Drug use: No  . Sexual activity: No  Other Topics Concern  . Not on file  Social History Narrative   Divorced.  Lives alone.  Ambulates with a walker.    Family History  Problem Relation Age of Onset  . Heart attack Father   . Hypertension Father   . Diabetes Mother   . Hypertension Mother   . Skin cancer Brother        melanoma  . Atrial fibrillation Sister   . Skin cancer Sister   . Colon cancer Neg Hx   . Esophageal cancer Neg Hx   . Rectal cancer Neg Hx   . Stomach cancer Neg Hx    Review of Systems - General ROS: negative for - chills or fever Respiratory ROS: no cough, shortness of breath, or wheezing Cardiovascular ROS: no chest pain or dyspnea on exertion Gastrointestinal ROS: no abdominal pain, change in bowel habits, or black or bloody stools   Allergies  Allergen  Reactions  . Sulfamethoxazole Rash     Medication History (Armen Eulas Post, CMA; 04/04/2017 12:02 PM) Metoprolol Succinate ER (25MG  Tablet ER 24HR, Oral) Active. MiraLax (Oral) Active. Potassium Chloride (10MEQ Capsule ER, Oral) Active. TraZODone HCl (100MG  Tablet, Oral) Active. CeleXA (20MG  Tablet, Oral) Active. Nitroglycerin (0.4MG  Tab Sublingual, Sublingual) Active. Albuterol Sulfate ER (8MG  Tablet ER 12HR, Oral) Active. AmLODIPine Besylate (10MG  Tablet, Oral) Active. Claritin (10MG  Tablet, Oral) Active. Multivitamins (Oral) Active. Ferrous Sulfate (325 (65 Fe)MG Tablet, Oral) Active. Xanax (0.25MG  Tablet, Oral) Active. NexIUM (40MG  Capsule DR, Oral) Active. Losartan Potassium (100MG  Tablet, Oral) Active. Vitamin D3 (5000UNIT Tablet Chewable, Oral) Active. Zolpidem Tartrate (10MG  Tablet, Oral) Active. AmLODIPine Besylate (5MG  Tablet, Oral) Active. Fluticasone Propionate (50MCG/ACT Suspension, Nasal) Active. Medications Reconciled  Vitals (Armen Glenn CMA; 04/04/2017 11:57 AM) 04/04/2017 11:57 AM Weight: 146 lb Height: 62in Body Surface Area: 1.67 m Body Mass Index: 26.7 kg/m  Temp.: 93F  Pulse: 86 (Regular)  P.OX: 98% (Room air) BP: 130/60 (Sitting, Left Arm, Standard)       Physical Exam Heather Ruff MD; 0/10/6576 1:38 PM) General Mental Status-Alert. General Appearance-Cooperative. CV: RRR Lungs: CTA Abdomen Palpation/Percussion Palpation and Percussion of the abdomen reveal - Soft and Non Tender.    Assessment & Plan Heather Ruff MD; 07/06/9627 1:39 PM) PARASTOMAL HERNIA WITHOUT OBSTRUCTION OR GANGRENE (K43.5) Impression: She is having difficulty pouching. I have once again offered her an appointment with an ostomy nurse for evaluation. She has refused this many times in the past it appears.  We can try a primary repair or possibly cover with a biologic mesh for a sugarbaker repair.  I do not think it is a good idea to reverse her  ostomy at this time.   ADENOMATOUS POLYP OF ASCENDING COLON (D12.2) Impression: I think this could be surgically resected. I have discussed this in detail. Given her previous surgical history, she is at risk for bleeding and damage to adjacent structures during surgery. We also discussed trying to fix her parastomal hernia at the same time. I think we could attempt a primary repair with possibly some biologic mesh overlay. This will require a major dissection and has a significant chance of bleeding. I do not think that she should undergo a colostomy reversal at the same time. I offered her a second opinion at [facility] as another option. In the meantime we will proceed with cardiac clearance for the above procedure.  The surgery and anatomy were described to the patient as well as the risks of surgery and the possible complications.  These include: Bleeding, deep abdominal infections and possible wound complications such as hernia and infection, damage to adjacent structures, leak of surgical connections, which can lead to other surgeries and possibly an ostomy, possible need for other procedures, such as abscess drains in radiology, possible prolonged hospital stay, possible diarrhea from removal of part of the colon, possible constipation from narcotics, prolonged fatigue/weakness or appetite loss, possible early recurrence of of disease, possible complications of their medical problems such as heart disease or arrhythmias or lung problems, death (less than 1%). I believe the patient understands and wishes to proceed with the surgery.

## 2017-04-18 ENCOUNTER — Encounter: Payer: Self-pay | Admitting: Physician Assistant

## 2017-04-18 ENCOUNTER — Ambulatory Visit (INDEPENDENT_AMBULATORY_CARE_PROVIDER_SITE_OTHER): Payer: Medicare Other | Admitting: Physician Assistant

## 2017-04-18 VITALS — BP 102/60 | HR 47 | Temp 96.8°F | Ht 62.5 in | Wt 144.0 lb

## 2017-04-18 DIAGNOSIS — E039 Hypothyroidism, unspecified: Secondary | ICD-10-CM

## 2017-04-18 DIAGNOSIS — D500 Iron deficiency anemia secondary to blood loss (chronic): Secondary | ICD-10-CM | POA: Diagnosis not present

## 2017-04-18 DIAGNOSIS — Z8719 Personal history of other diseases of the digestive system: Secondary | ICD-10-CM | POA: Diagnosis not present

## 2017-04-18 DIAGNOSIS — I10 Essential (primary) hypertension: Secondary | ICD-10-CM

## 2017-04-18 MED ORDER — LOSARTAN POTASSIUM 100 MG PO TABS: 50.0000 mg | ORAL_TABLET | Freq: Every day | ORAL | 1 refills | Status: DC

## 2017-04-18 MED ORDER — LORATADINE 10 MG PO TABS
10.0000 mg | ORAL_TABLET | Freq: Every day | ORAL | 3 refills | Status: DC
Start: 2017-04-18 — End: 2018-01-30

## 2017-04-18 MED ORDER — ONDANSETRON HCL 4 MG PO TABS: 4.0000 mg | ORAL_TABLET | Freq: Three times a day (TID) | ORAL | 2 refills | Status: DC | PRN

## 2017-04-18 NOTE — Patient Instructions (Signed)
In a few days you may receive a survey in the mail or online from [employer] regarding your visit with us today. Please take a moment to fill this out. Your feedback is very important to our whole office. It can help us better understand your needs as well as improve your experience and satisfaction. Thank you for taking your time to complete it. We care about you.  Quanesha Klimaszewski, PA-C  

## 2017-04-18 NOTE — Progress Notes (Signed)
BP 102/60   Pulse (!) 47   Temp (!) 96.8 F (36 C) (Oral)   Ht 5' 2.5" (1.588 m)   Wt 144 lb (65.3 kg)   BMI 25.92 kg/m    Subjective:    Patient ID: [name], female    DOB: 1934/04/20, 83 y.o.   MRN: [id number]  HPI: [name] is a 83 y.o. female presenting on 04/18/2017 for Follow-up (3 month ) This patient comes in for periodic recheck on medications and conditions including hypertension, hypothyroidism, blood loss, GI bleed. Planning for abdominal surgery soon and doing well.   All medications are reviewed today. There are no reports of any problems with the medications. All of the medical conditions are reviewed and updated.  Lab work is reviewed and will be ordered as medically necessary. There are no new problems reported with today's visit.    Relevant past medical, surgical, family and social history reviewed and updated as indicated. Allergies and medications reviewed and updated.  Past Medical History:  Diagnosis Date  . Anemia   . Anxiety state, unspecified   . Atrial fibrillation with RVR ([place])   . [name]'s esophagus   . Bilateral sciatica   . Cellulitis of extremity   . Chronic back pain   . Congestive heart failure, unspecified    diastolic heart failure  . Coronary artery disease    a. Moderate to severe coronary disease involving the left anterior descending artery and right coronary artery.  Sequential stenosis in the LAD is significant.  Right coronary artery is moderate to severely likely nonischemic. Questionable small LVOT obstruction.  No Brockenbrough maneuver was performed.  Catheterization January 2013; b. 05/2014 MV no isch/infarct, EF 60%.  . Depressive disorder, not elsewhere classified   . Diverticulitis   . Erosive esophagitis 08/04/2015  . Esophageal reflux   . Gastric volvulus 08/04/2015  . Gastritis   . H/O hiatal hernia   . Hypertension   . IBS (irritable bowel syndrome)   . Infection of esophagostomy ([place])   . Macular degeneration     . Melanoma ([place])    removal on back   . Pelvic abscess in female   . Perforated bowel ([place])   . Perforated sigmoid colon ([place])   . PONV (postoperative nausea and vomiting)   . Pulmonary hypertension (HCC)    PA systolic pressure 36-46 mmHg by echocardiogram, PA pressure 33/10 by cardiac catheterization PA saturation 62% thermodilution cardiac index 2.0 thick cardiac index 2.4  . Pure hypercholesterolemia   . Tricuspid regurgitation    moderate tricuspid regurgitation by echocardiogram, prior use of anorexic agents    Past Surgical History:  Procedure Laterality Date  . CARDIAC CATHETERIZATION Left 03/05/2015   Procedure: CENTRAL LINE INSERTION;  Surgeon: Aviva Signs, MD;  Location: [facility];  Service: General;  Laterality: Left;  . CATARACT EXTRACTION W/ INTRAOCULAR LENS  IMPLANT, BILATERAL  ~ 2002  . CHOLECYSTECTOMY N/A 07/18/2016   Procedure: LAPAROSCOPIC CHOLECYSTECTOMY WITH INTRAOPERATIVE CHOLANGIOGRAM;  Surgeon: [name], MD;  [facility];  Service: General;  Laterality: N/A;  . COLECTOMY WITH COLOSTOMY CREATION/HARTMANN PROCEDURE N/A 03/05/2015   Procedure: PARTIAL COLECTOMY WITH COLOSTOMY CREATION/HARTMANN PROCEDURE;  Surgeon: Aviva Signs, MD;  Location: [facility];  Service: General;  Laterality: N/A;  . CORONARY ANGIOPLASTY WITH STENT PLACEMENT  05/08/11   "1"  . ESOPHAGEAL MANOMETRY N/A 06/06/2014   Procedure: ESOPHAGEAL MANOMETRY (EM);  Surgeon: Jerene Bears, MD;  [facility];  Service: Gastroenterology;  Laterality:  N/A;  . ESOPHAGOGASTRODUODENOSCOPY (EGD) WITH PROPOFOL N/A 08/04/2015   Procedure: ESOPHAGOGASTRODUODENOSCOPY (EGD) WITH PROPOFOL;  Surgeon: Mauri Pole, MD;  [facility];  Service: Endoscopy;  Laterality: N/A;  . HERNIA REPAIR    . IR GENERIC HISTORICAL  03/04/2016   IR LUMBAR DISC ASPIRATION W/IMG GUIDE 03/04/2016 Luanne Bras, MD [facility]  . LAPAROSCOPIC NISSEN FUNDOPLICATION N/A 11/26/32   Procedure: LAPAROSCOPIC   LYSIS OF ADHESIONS, SEGMENTAL GASTRECTOMY, PARTIAL REDUCTION OF HERNIA;  Surgeon: [name], MD;  [facility];  Service: General;  Laterality: N/A;  . NISSEN FUNDOPLICATION  9179  . PERCUTANEOUS CORONARY STENT INTERVENTION (PCI-S) N/A 05/08/2011   Procedure: PERCUTANEOUS CORONARY STENT INTERVENTION (PCI-S);  Surgeon: Wellington Hampshire, MD;  Location: [facility] CATH LAB;  Service: Cardiovascular;  Laterality: N/A;  . ROTATOR CUFF REPAIR  ~ 2001   right  . SHOULDER ARTHROSCOPY  ~ 2004; 2005   left; "joint's wore out"  . TONSILLECTOMY  ~ 1944  . TUBAL LIGATION  1972    Review of Systems  Constitutional: Negative.  Negative for activity change, fatigue and fever.  HENT: Negative.   Eyes: Negative.   Respiratory: Negative.  Negative for cough.   Cardiovascular: Negative.  Negative for chest pain.  Gastrointestinal: Negative.  Negative for abdominal pain.  Endocrine: Negative.   Genitourinary: Negative.  Negative for dysuria.  Musculoskeletal: Negative.   Skin: Negative.   Neurological: Negative.     Allergies as of 04/18/2017      Reactions   Sulfamethoxazole Rash      Medication List        Accurate as of 04/18/17  1:55 PM. Always use your most recent med list.          albuterol 108 (90 Base) MCG/ACT inhaler Commonly known as:  PROVENTIL HFA;VENTOLIN HFA Inhale 2 puffs into the lungs every 6 (six) hours as needed for wheezing or shortness of breath.   ALPRAZolam 0.25 MG tablet Commonly known as:  XANAX TAKE 1/2 TO 1 TABLET 3 TIMES DAILY AS NEEDED   amLODipine 5 MG tablet Commonly known as:  NORVASC Daily   citalopram 40 MG tablet Commonly known as:  CELEXA TAKE ONE (1) TABLET EACH DAY   esomeprazole 40 MG capsule Commonly known as:  NEXIUM TAKE ONE (1) CAPSULE EACH DAY   ferrous sulfate 325 (65 FE) MG EC tablet Take 325 mg by mouth daily.   fluticasone 50 MCG/ACT nasal spray Commonly known as:  FLONASE USE 2 SPRAYS IN EACH NOSTRIL DAILY   furosemide 20 MG  tablet Commonly known as:  LASIX TAKE ONE (1) TABLET EACH DAY   hydrALAZINE 50 MG tablet Commonly known as:  APRESOLINE TAKE ONE (1) TABLET THREE (3) TIMES EACH DAY   isosorbide mononitrate 30 MG 24 hr tablet Commonly known as:  IMDUR TAKE ONE TABLET EVERY MORNING   loratadine 10 MG tablet Commonly known as:  CLARITIN Take 1 tablet (10 mg total) by mouth daily.   losartan 100 MG tablet Commonly known as:  COZAAR Take 0.5 tablets (50 mg total) by mouth daily.   meclizine 25 MG tablet Commonly known as:  ANTIVERT Take 1 tablet (25 mg total) by mouth 3 (three) times daily as needed for dizziness.   metoprolol succinate 25 MG 24 hr tablet Commonly known as:  TOPROL-XL TAKE ONE TABLET BY MOUTH TWICE DAILY   montelukast 10 MG tablet Commonly known as:  SINGULAIR Take 1 tablet (10 mg total) by mouth at bedtime.  nitroGLYCERIN 0.4 MG SL tablet Commonly known as:  NITROSTAT Place 1 tablet (0.4 mg total) under the tongue every 5 (five) minutes as needed for chest pain. Reported on 04/26/2015   ondansetron 4 MG tablet Commonly known as:  ZOFRAN Take 1 tablet (4 mg total) by mouth every 8 (eight) hours as needed for nausea or vomiting.   polyethylene glycol powder powder Commonly known as:  GLYCOLAX/MIRALAX MIX 17 GRAMS IN 80Z OF WATER/JUICE AND DRINK TWICE DAILY   potassium chloride 10 MEQ tablet Commonly known as:  K-DUR TAKE ONE (1) TABLET EACH DAY   PRESERVISION AREDS Tabs Take 1 tablet by mouth 2 (two) times daily.   rosuvastatin 10 MG tablet Commonly known as:  CRESTOR TAKE ONE (1) TABLET EACH DAY   traZODone 100 MG tablet Commonly known as:  DESYREL TAKE ONE TABLET DAILY AT BEDTIME   Vitamin D (Ergocalciferol) 50000 units Caps capsule Commonly known as:  DRISDOL TAKE 1 CAPSULE EVERY WEEK ON TUESDAYS   zolpidem 10 MG tablet Commonly known as:  AMBIEN TAKE ONE (1) TABLET AT BEDTIME          Objective:    BP 102/60   Pulse (!) 47   Temp (!) 96.8 F  (36 C) (Oral)   Ht 5' 2.5" (1.588 m)   Wt 144 lb (65.3 kg)   BMI 25.92 kg/m   Allergies  Allergen Reactions  . Sulfamethoxazole Rash    Physical Exam  Constitutional: She is oriented to person, place, and time. She appears well-developed and well-nourished.  HENT:  Head: Normocephalic and atraumatic.  Eyes: Conjunctivae and EOM are normal. Pupils are equal, round, and reactive to light.  Cardiovascular: Normal rate, regular rhythm, normal heart sounds and intact distal pulses.  Pulmonary/Chest: Effort normal and breath sounds normal.  Abdominal: Soft. Bowel sounds are normal.  Neurological: She is alert and oriented to person, place, and time. She has normal reflexes.  Skin: Skin is warm and dry. No rash noted.  Psychiatric: She has a normal mood and affect. Her behavior is normal. Judgment and thought content normal.  Nursing note and vitals reviewed.   Results for orders placed or performed in visit on 01/15/17  CBC with Differential/Platelet  Result Value Ref Range   WBC 6.4 3.4 - 10.8 x10E3/uL   RBC 4.13 3.77 - 5.28 x10E6/uL   Hemoglobin 10.9 (L) 11.1 - 15.9 g/dL   Hematocrit 32.8 (L) 34.0 - 46.6 %   MCV 79 79 - 97 fL   MCH 26.4 (L) 26.6 - 33.0 pg   MCHC 33.2 31.5 - 35.7 g/dL   RDW 16.0 (H) 12.3 - 15.4 %   Platelets 174 150 - 379 x10E3/uL   Neutrophils 64 Not Estab. %   Lymphs 25 Not Estab. %   Monocytes 8 Not Estab. %   Eos 3 Not Estab. %   Basos 0 Not Estab. %   Neutrophils Absolute 4.1 1.4 - 7.0 x10E3/uL   Lymphocytes Absolute 1.6 0.7 - 3.1 x10E3/uL   Monocytes Absolute 0.5 0.1 - 0.9 x10E3/uL   EOS (ABSOLUTE) 0.2 0.0 - 0.4 x10E3/uL   Basophils Absolute 0.0 0.0 - 0.2 x10E3/uL   Immature Granulocytes 0 Not Estab. %   Immature Grans (Abs) 0.0 0.0 - 0.1 x10E3/uL  CMP14+EGFR  Result Value Ref Range   Glucose 84 65 - 99 mg/dL   BUN 18 8 - 27 mg/dL   Creatinine, Ser 1.65 (H) 0.57 - 1.00 mg/dL   GFR calc non Af [place] Lora  29 (L) >59 mL/min/1.73   GFR calc Af Amer 34  (L) >59 mL/min/1.73   BUN/Creatinine Ratio 11 (L) 12 - 28   Sodium 140 134 - 144 mmol/L   Potassium 4.2 3.5 - 5.2 mmol/L   Chloride 101 96 - 106 mmol/L   CO2 24 20 - 29 mmol/L   Calcium 9.2 8.7 - 10.3 mg/dL   Total Protein 6.0 6.0 - 8.5 g/dL   Albumin 4.0 3.5 - 4.7 g/dL   Globulin, Total 2.0 1.5 - 4.5 g/dL   Albumin/Globulin Ratio 2.0 1.2 - 2.2   Bilirubin Total 0.3 0.0 - 1.2 mg/dL   Alkaline Phosphatase 64 39 - 117 IU/L   AST 13 0 - 40 IU/L   ALT 9 0 - 32 IU/L  Lipid panel  Result Value Ref Range   Cholesterol, Total 166 100 - 199 mg/dL   Triglycerides 184 (H) 0 - 149 mg/dL   HDL 45 >39 mg/dL   VLDL Cholesterol Cal 37 5 - 40 mg/dL   LDL Calculated 84 0 - 99 mg/dL   Chol/HDL Ratio 3.7 0.0 - 4.4 ratio  Thyroid Panel With TSH  Result Value Ref Range   TSH 5.720 (H) 0.450 - 4.500 uIU/mL   T4, Total 5.7 4.5 - 12.0 ug/dL   T3 Uptake Ratio 24 24 - 39 %   Free Thyroxine Index 1.4 1.2 - 4.9  Iron  Result Value Ref Range   Iron 48 27 - 139 ug/dL      Assessment & Plan:   1. Essential hypertension - amLODipine (NORVASC) 5 MG tablet; Daily - losartan (COZAAR) 100 MG tablet; Take 0.5 tablets (50 mg total) by mouth daily.  Dispense: 90 tablet; Refill: 1  2. Acquired hypothyroidism - Thyroid Panel With TSH  3. Anemia due to chronic blood loss  4. History of GI bleed - ondansetron (ZOFRAN) 4 MG tablet; Take 1 tablet (4 mg total) by mouth every 8 (eight) hours as needed for nausea or vomiting.  Dispense: 60 tablet; Refill: 2    Current Outpatient Medications:  .  albuterol (PROVENTIL HFA;VENTOLIN HFA) 108 (90 Base) MCG/ACT inhaler, Inhale 2 puffs into the lungs every 6 (six) hours as needed for wheezing or shortness of breath. , Disp: , Rfl:  .  ALPRAZolam (XANAX) 0.25 MG tablet, TAKE 1/2 TO 1 TABLET 3 TIMES DAILY AS NEEDED, Disp: 270 tablet, Rfl: 0 .  amLODipine (NORVASC) 5 MG tablet, Daily, Disp: , Rfl:  .  citalopram (CELEXA) 40 MG tablet, TAKE ONE (1) TABLET EACH DAY, Disp:  90 tablet, Rfl: 3 .  esomeprazole (NEXIUM) 40 MG capsule, TAKE ONE (1) CAPSULE EACH DAY, Disp: 90 capsule, Rfl: 1 .  ferrous sulfate 325 (65 FE) MG EC tablet, Take 325 mg by mouth daily. , Disp: , Rfl:  .  fluticasone (FLONASE) 50 MCG/ACT nasal spray, USE 2 SPRAYS IN EACH NOSTRIL DAILY, Disp: 48 g, Rfl: 1 .  furosemide (LASIX) 20 MG tablet, TAKE ONE (1) TABLET EACH DAY, Disp: 90 tablet, Rfl: 1 .  hydrALAZINE (APRESOLINE) 50 MG tablet, TAKE ONE (1) TABLET THREE (3) TIMES EACH DAY, Disp: 270 tablet, Rfl: 1 .  isosorbide mononitrate (IMDUR) 30 MG 24 hr tablet, TAKE ONE TABLET EVERY MORNING, Disp: 90 tablet, Rfl: 1 .  loratadine (CLARITIN) 10 MG tablet, Take 1 tablet (10 mg total) by mouth daily., Disp: 90 tablet, Rfl: 3 .  losartan (COZAAR) 100 MG tablet, Take 0.5 tablets (50 mg total) by mouth daily., Disp: 90 tablet,  Rfl: 1 .  meclizine (ANTIVERT) 25 MG tablet, Take 1 tablet (25 mg total) by mouth 3 (three) times daily as needed for dizziness., Disp: 30 tablet, Rfl: 0 .  metoprolol succinate (TOPROL-XL) 25 MG 24 hr tablet, TAKE ONE TABLET BY MOUTH TWICE DAILY, Disp: 180 tablet, Rfl: 1 .  montelukast (SINGULAIR) 10 MG tablet, Take 1 tablet (10 mg total) by mouth at bedtime., Disp: 90 tablet, Rfl: 3 .  Multiple Vitamins-Minerals (PRESERVISION AREDS) TABS, Take 1 tablet by mouth 2 (two) times daily. , Disp: , Rfl:  .  nitroGLYCERIN (NITROSTAT) 0.4 MG SL tablet, Place 1 tablet (0.4 mg total) under the tongue every 5 (five) minutes as needed for chest pain. Reported on 04/26/2015, Disp: 25 tablet, Rfl: 3 .  ondansetron (ZOFRAN) 4 MG tablet, Take 1 tablet (4 mg total) by mouth every 8 (eight) hours as needed for nausea or vomiting., Disp: 60 tablet, Rfl: 2 .  polyethylene glycol powder (GLYCOLAX/MIRALAX) powder, MIX 17 GRAMS IN 80Z OF WATER/JUICE AND DRINK TWICE DAILY, Disp: 1054 g, Rfl: 2 .  potassium chloride (K-DUR) 10 MEQ tablet, TAKE ONE (1) TABLET EACH DAY, Disp: 90 tablet, Rfl: 1 .  rosuvastatin  (CRESTOR) 10 MG tablet, TAKE ONE (1) TABLET EACH DAY, Disp: 90 tablet, Rfl: 1 .  traZODone (DESYREL) 100 MG tablet, TAKE ONE TABLET DAILY AT BEDTIME, Disp: 90 tablet, Rfl: 1 .  Vitamin D, Ergocalciferol, (DRISDOL) 50000 units CAPS capsule, TAKE 1 CAPSULE EVERY WEEK ON TUESDAYS, Disp: 12 capsule, Rfl: 3 .  zolpidem (AMBIEN) 10 MG tablet, TAKE ONE (1) TABLET AT BEDTIME, Disp: 90 tablet, Rfl: 0 Continue all other maintenance medications as listed above.  Follow up plan: Return in about 3 months (around 07/17/2017) for recheck.  Educational handout given for [place] PA-C [place] [address]  [place], [place] [address] [phone]   04/18/2017, 1:55 PM

## 2017-04-19 ENCOUNTER — Other Ambulatory Visit: Payer: Self-pay | Admitting: Physician Assistant

## 2017-04-19 DIAGNOSIS — I10 Essential (primary) hypertension: Secondary | ICD-10-CM

## 2017-04-19 LAB — THYROID PANEL WITH TSH
Free Thyroxine Index: 1.8 (ref 1.2–4.9)
T3 UPTAKE RATIO: 25 % (ref 24–39)
T4, Total: 7.3 ug/dL (ref 4.5–12.0)
TSH: 7.04 u[IU]/mL — ABNORMAL HIGH (ref 0.450–4.500)

## 2017-04-22 ENCOUNTER — Other Ambulatory Visit: Payer: Self-pay | Admitting: Physician Assistant

## 2017-04-22 MED ORDER — LEVOTHYROXINE SODIUM 50 MCG PO TABS: 50.0000 ug | ORAL_TABLET | Freq: Every day | ORAL | 0 refills | Status: DC

## 2017-04-30 ENCOUNTER — Encounter (HOSPITAL_COMMUNITY): Payer: Self-pay

## 2017-05-01 IMAGING — CT CT RENAL STONE PROTOCOL
2 of 4 series · 16 of 46 positions shown, 18 images · non-contrast
Comparison: None.

CLINICAL DATA: Right flank pain since 4 p.m. today. Urinary
frequency. Nausea and vomiting.

EXAM:
CT ABDOMEN AND PELVIS WITHOUT CONTRAST
TECHNIQUE: Multidetector CT imaging of the abdomen and pelvis was performed
following the standard protocol without IV contrast.

[Series 2: standard/full over (age)lbs 5.0 · axial · 0.76mm/px · z∈[-387,-2]mm · 13 of 85 slices shown, 15 images]
[im 4/85  soft-tissue]
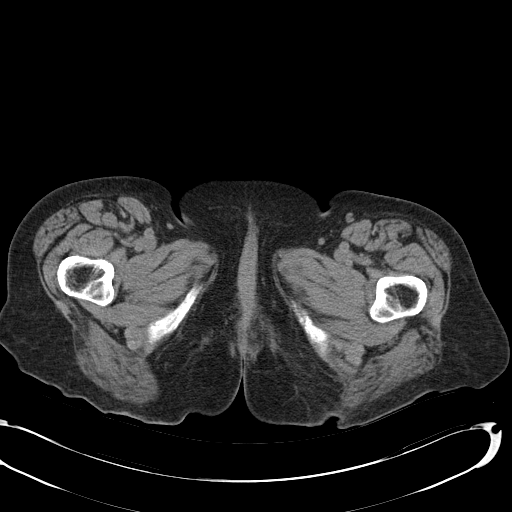
[im 4/85  bone]
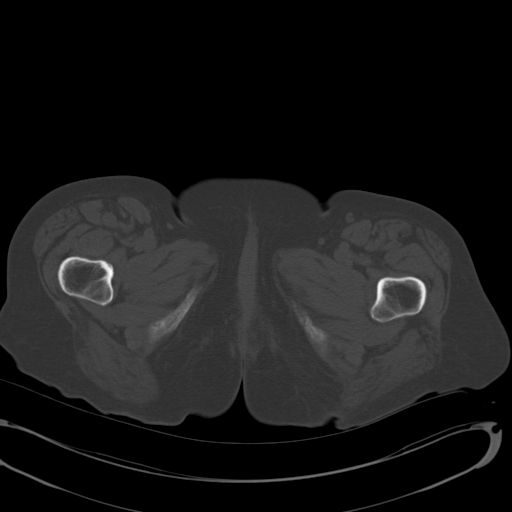
[im 11/85  soft-tissue]
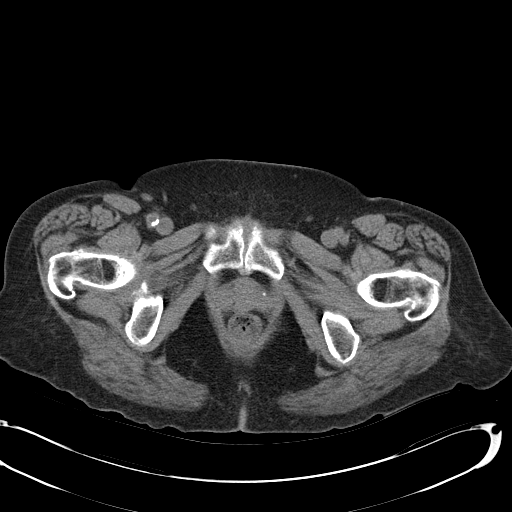
[im 18/85  soft-tissue]
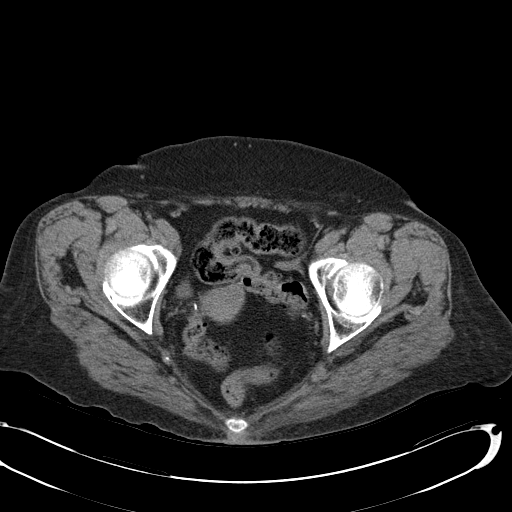
[im 25/85  soft-tissue]
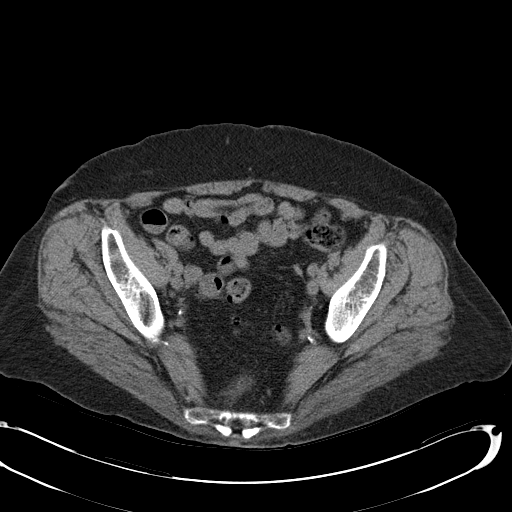
[im 29/85  soft-tissue]
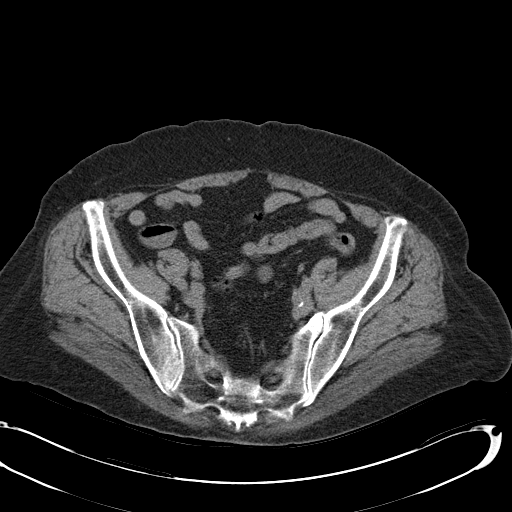
[im 36/85  soft-tissue]
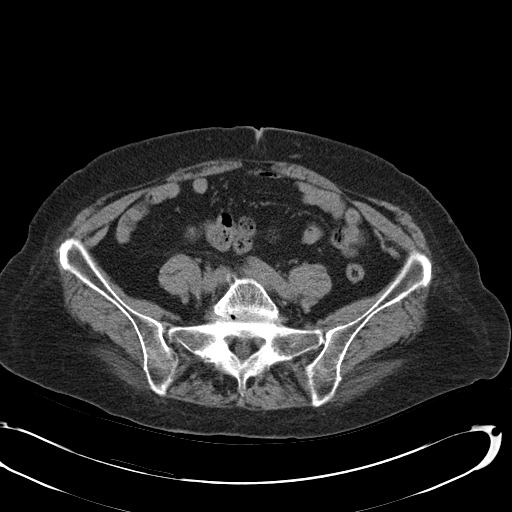
[im 43/85  soft-tissue]
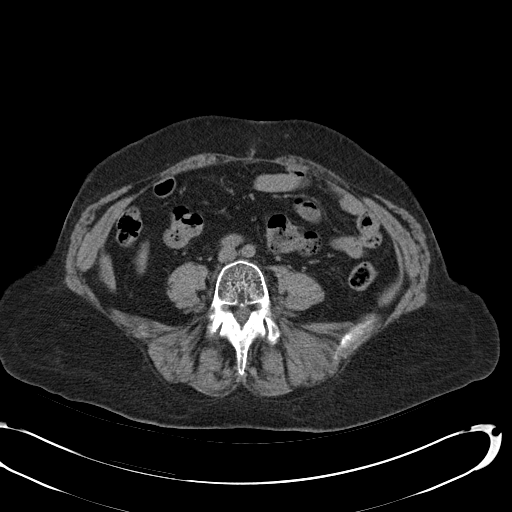
[im 50/85  soft-tissue]
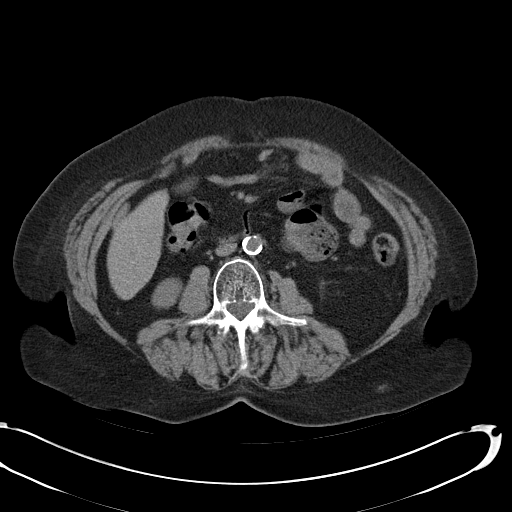
[im 57/85  soft-tissue]
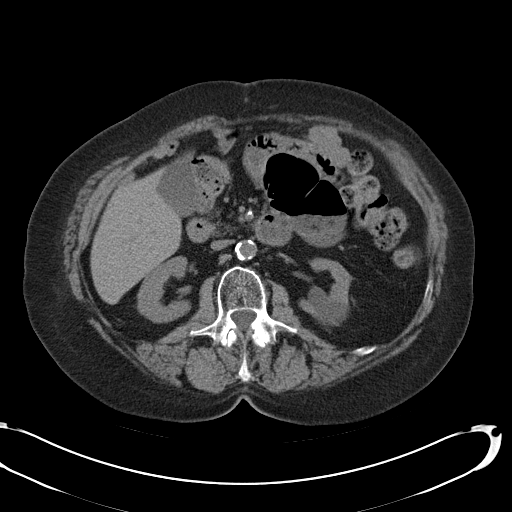
[im 57/85  bone]
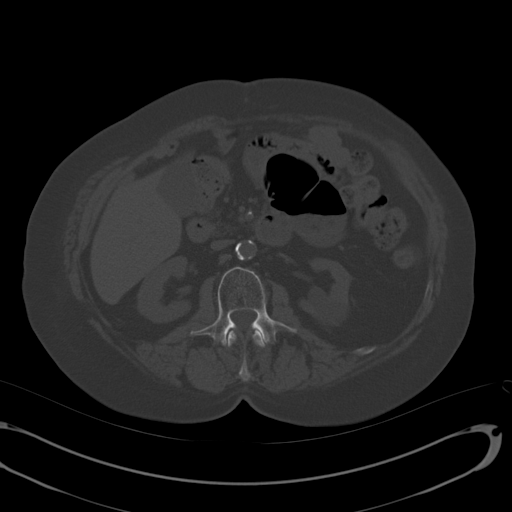
[im 60/85  soft-tissue]
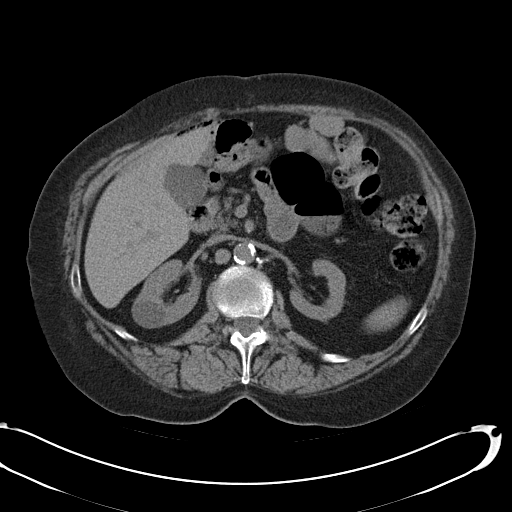
[im 67/85  soft-tissue]
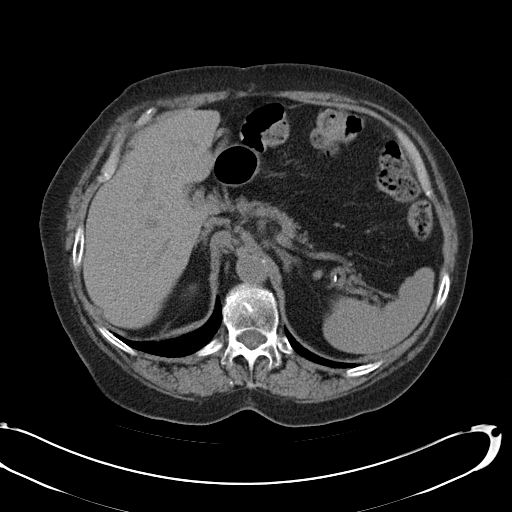
[im 74/85  soft-tissue]
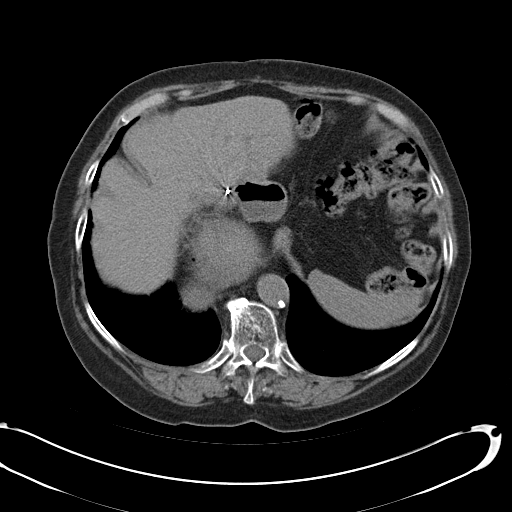
[im 81/85  soft-tissue]
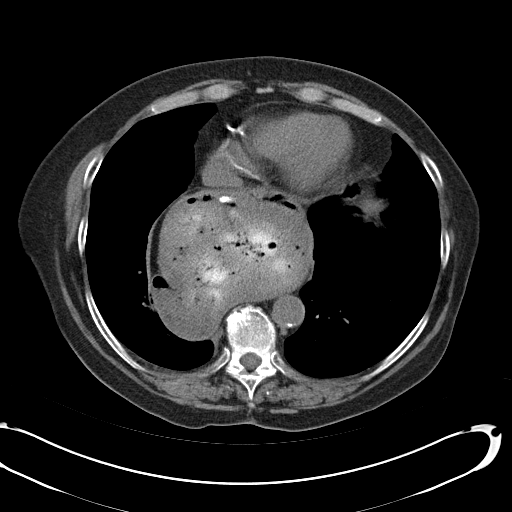

[Series 3: mpr coronal · coronal · 0.73mm/px · 3 of 89 slices shown]
[im 30/89  soft-tissue]
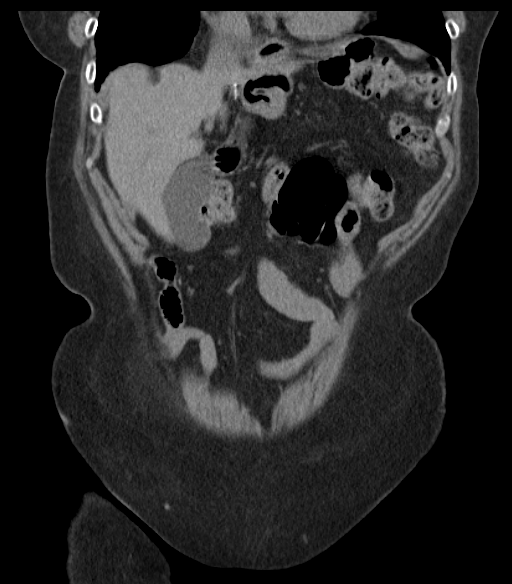
[im 40/89  soft-tissue]
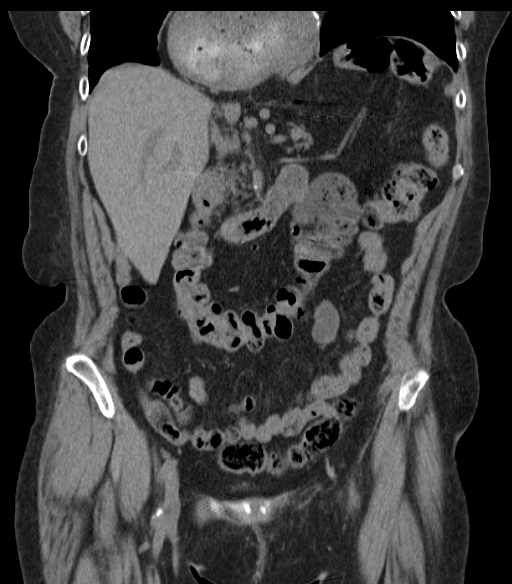
[im 49/89  soft-tissue]
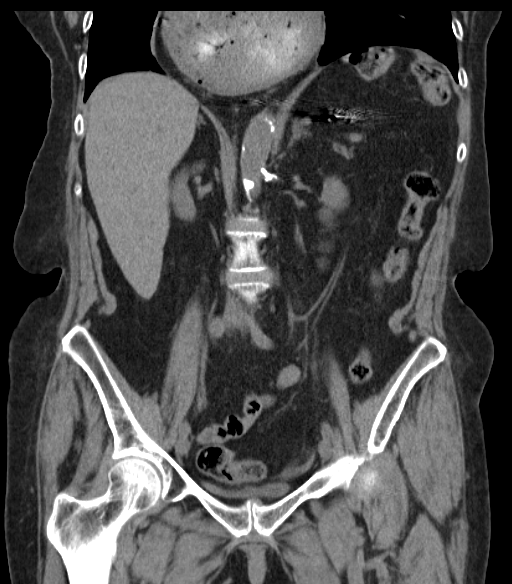

[16 of 46 positions shown; findings below may reference images not displayed]

FINDINGS: Lung bases: Large hiatal hernia. There are bowel anastomosis staples
along the anterior margin of the herniated stomach. Minor
subsegmental atelectasis in the right lower lobe adjacent to the
hernia. 3-4 mm nodule in the lateral left lung base, image 7, series
6. Heart normal in size. There are coronary artery calcifications.

Liver, spleen, gallbladder, pancreas, adrenal glands:  Unremarkable.

There are surgical vascular clips adjacent to the pancreatic tail.

Kidneys, ureters, bladder: No ureteral stone or obstructive
uropathy. No collecting system dilation. Single 3-4 mm
nonobstructing stone in the lower pole the right kidney. 4 mm
nonobstructing stone in the lower pole of the left kidney. Bilateral
low-density renal masses consistent with cysts. Ureters normal
course and in caliber. Bladder is mostly decompressed but otherwise
unremarkable.

Uterus and adnexa:  Unremarkable.

Lymph nodes:  No adenopathy.

Ascites:  None.

Gastrointestinal: Cecum extends across midline to the left central
abdomen. There are few left colon diverticula. No diverticulitis.
Colon otherwise unremarkable. Normal small bowel. Normal appendix
visualized.

Abdominal wall:  No significant hernia.

Vascular: Atherosclerotic calcifications are noted along the aorta
and its branch vessels. No aneurysm.

Musculoskeletal: Disc and facet degenerative changes in the lower
lumbar spine. No osteoblastic or osteolytic lesions.
IMPRESSION: 1. No acute findings.
2. No evidence of a ureteral stone or obstructive uropathy. No
findings to explain the patient's right flank pain.
3. There are single nonobstructing intrarenal stones in each kidney
as well as bilateral renal cysts.
4. Scattered left colon diverticula without diverticulitis.
5. Normal appendix visualized.
6. Large hiatal hernia.

## 2017-05-05 ENCOUNTER — Encounter (HOSPITAL_COMMUNITY)
Admission: RE | Admit: 2017-05-05 | Discharge: 2017-05-05 | Disposition: A | Discharge status: Home / Self Care | Payer: Medicare Other | Source: Ambulatory Visit | Attending: General Surgery | Admitting: General Surgery

## 2017-05-05 ENCOUNTER — Encounter (HOSPITAL_COMMUNITY): Payer: Self-pay

## 2017-05-05 ENCOUNTER — Other Ambulatory Visit: Payer: Self-pay

## 2017-05-05 HISTORY — DX: Atrophy of kidney (terminal): N26.1

## 2017-05-05 HISTORY — DX: Other specified postprocedural states: Z98.890

## 2017-05-05 HISTORY — DX: Presence of spectacles and contact lenses: Z97.3

## 2017-05-05 HISTORY — DX: Personal history of other diseases of the digestive system: Z87.19

## 2017-05-05 HISTORY — DX: Chronic diastolic (congestive) heart failure: I50.32

## 2017-05-05 HISTORY — DX: Other specified postprocedural states: Z85.820

## 2017-05-05 HISTORY — DX: Gastro-esophageal reflux disease without esophagitis: K21.9

## 2017-05-05 HISTORY — DX: Paroxysmal atrial fibrillation: I48.0

## 2017-05-05 HISTORY — DX: Chronic kidney disease, stage 3 (moderate): N18.3

## 2017-05-05 HISTORY — DX: Hypothyroidism, unspecified: E03.9

## 2017-05-05 HISTORY — DX: Presence of coronary angioplasty implant and graft: Z95.5

## 2017-05-05 HISTORY — DX: Iron deficiency anemia secondary to blood loss (chronic): D50.0

## 2017-05-05 HISTORY — DX: Chronic kidney disease, stage 3 unspecified: N18.30

## 2017-05-05 HISTORY — DX: Presence of dental prosthetic device (complete) (partial): Z97.2

## 2017-05-05 LAB — CBC
HEMATOCRIT: 35.8 % — AB (ref 36.0–46.0)
Hemoglobin: 11.5 g/dL — ABNORMAL LOW (ref 12.0–15.0)
MCH: 26.7 pg (ref 26.0–34.0)
MCHC: 32.1 g/dL (ref 30.0–36.0)
MCV: 83.3 fL (ref 78.0–100.0)
PLATELETS: 181 10*3/uL (ref 150–400)
RBC: 4.3 MIL/uL (ref 3.87–5.11)
RDW: 14.5 % (ref 11.5–15.5)
WBC: 5.1 10*3/uL (ref 4.0–10.5)

## 2017-05-05 LAB — COMPREHENSIVE METABOLIC PANEL
ALBUMIN: 3.7 g/dL (ref 3.5–5.0)
ALT: 7 U/L — AB (ref 14–54)
AST: 16 U/L (ref 15–41)
Alkaline Phosphatase: 58 U/L (ref 38–126)
Anion gap: 6 (ref 5–15)
BILIRUBIN TOTAL: 0.3 mg/dL (ref 0.3–1.2)
BUN: 24 mg/dL — AB (ref 6–20)
CHLORIDE: 102 mmol/L (ref 101–111)
CO2: 29 mmol/L (ref 22–32)
CREATININE: 1.62 mg/dL — AB (ref 0.44–1.00)
Calcium: 8.7 mg/dL — ABNORMAL LOW (ref 8.9–10.3)
GFR calc Af Amer: 33 mL/min — ABNORMAL LOW (ref >60)
GFR calc non Af Amer: 29 mL/min — ABNORMAL LOW (ref >60)
GLUCOSE: 87 mg/dL (ref 65–99)
POTASSIUM: 4.5 mmol/L (ref 3.5–5.1)
Sodium: 137 mmol/L (ref 135–145)
TOTAL PROTEIN: 6.7 g/dL (ref 6.5–8.1)

## 2017-05-05 NOTE — Patient Instructions (Addendum)
[name]  05/05/2017   Your procedure is scheduled on:  05-07-2017  Report to [facility] Main  Entrance  Report to admitting at AM   0930   Call this number if you have problems the morning of surgery [phone]   Remember: Do not eat food or drink liquids :After Midnight.     Take these medicines the morning of surgery with A SIP OF WATER: Imdur, Hydralazine, Synthroid, Nexium, if needed take xanax / do systane eye drops if needed                                You may not have any metal on your body including hair pins and              piercings  Do not wear jewelry, make-up, lotions, powders or perfumes, deodorant             Do not wear nail polish.  Do not shave  48 hours prior to surgery.               Do not bring valuables to the hospital. [place].  Contacts, dentures or bridgework may not be worn into surgery.  Leave suitcase in the car. After surgery it may be brought to your room.       Name and phone number of your driver:  Special Instructions: N/A              Please read over the following fact sheets you were given: _____________________________________________________________________             [facility] - Preparing for Surgery  Before surgery, you can play an important role.  Because skin is not sterile, your skin needs to be as free of germs as possible.  You can reduce the number of germs on you skin by washing with CHG (chlorahexidine gluconate) soap before surgery.  CHG is an antiseptic cleaner which kills germs and bonds with the skin to continue killing germs even after washing.  Please DO NOT use if you have an allergy to CHG or antibacterial soaps.  If your skin becomes reddened/irritated stop using the CHG and inform your nurse when you arrive at Short Stay.  Do not shave (including legs and underarms) for at least 48 hours prior to the first CHG shower.  You may  shave your face.  Please follow these instructions carefully:   1.  Shower with CHG Soap the night before surgery and the                                morning of Surgery.  2.  If you choose to wash your hair, wash your hair first as usual with your       normal shampoo.  3.  After you shampoo, rinse your hair and body thoroughly to remove the                      Shampoo.  4.  Use CHG as you would any other liquid soap.  You can apply chg directly       to the skin and wash  gently with scrungie or a clean washcloth.  5.  Apply the CHG Soap to your body ONLY FROM THE NECK DOWN.        Do not use on open wounds or open sores.  Avoid contact with your eyes,       ears, mouth and genitals (private parts).  Wash genitals (private parts)       with your normal soap.  6.  Wash thoroughly, paying special attention to the area where your surgery        will be performed.  7.  Thoroughly rinse your body with warm water from the neck down.  8.  DO NOT shower/wash with your normal soap after using and rinsing off       the CHG Soap.  9.  Pat yourself dry with a clean towel.            10.  Wear clean pajamas.            11.  Place clean sheets on your bed the night of your first shower and do not        sleep with pets.  Day of Surgery  Do not apply any lotions/deoderants the morning of surgery.  Please wear clean clothes to the hospital/surgery center.    Incentive Spirometer  An incentive spirometer is a tool that can help keep your lungs clear and active. This tool measures how well you are filling your lungs with each breath. Taking long deep breaths may help reverse or decrease the chance of developing breathing (pulmonary) problems (especially infection) following:  A long period of time when you are unable to move or be active. BEFORE THE PROCEDURE   If the spirometer includes an indicator to show your best effort, your nurse or respiratory therapist will set it to a desired goal.  If  possible, sit up straight or lean slightly forward. Try not to slouch.  Hold the incentive spirometer in an upright position. INSTRUCTIONS FOR USE  1. Sit on the edge of your bed if possible, or sit up as far as you can in bed or on a chair. 2. Hold the incentive spirometer in an upright position. 3. Breathe out normally. 4. Place the mouthpiece in your mouth and seal your lips tightly around it. 5. Breathe in slowly and as deeply as possible, raising the piston or the ball toward the top of the column. 6. Hold your breath for 3-5 seconds or for as long as possible. Allow the piston or ball to fall to the bottom of the column. 7. Remove the mouthpiece from your mouth and breathe out normally. 8. Rest for a few seconds and repeat Steps 1 through 7 at least 10 times every 1-2 hours when you are awake. Take your time and take a few normal breaths between deep breaths. 9. The spirometer may include an indicator to show your best effort. Use the indicator as a goal to work toward during each repetition. 10. After each set of 10 deep breaths, practice coughing to be sure your lungs are clear. If you have an incision (the cut made at the time of surgery), support your incision when coughing by placing a pillow or rolled up towels firmly against it. Once you are able to get out of bed, walk around indoors and cough well. You may stop using the incentive spirometer when instructed by your caregiver.  RISKS AND COMPLICATIONS  Take your time so you do not get dizzy or  light-headed.  If you are in pain, you may need to take or ask for pain medication before doing incentive spirometry. It is harder to take a deep breath if you are having pain. AFTER USE  Rest and breathe slowly and easily.  It can be helpful to keep track of a log of your progress. Your caregiver can provide you with a simple table to help with this. If you are using the spirometer at home, follow these instructions: [name]  IF:   You are having difficultly using the spirometer.  You have trouble using the spirometer as often as instructed.  Your pain medication is not giving enough relief while using the spirometer.  You develop fever of 100.5 F (38.1 C) or higher. SEEK IMMEDIATE MEDICAL CARE IF:   You cough up bloody sputum that had not been present before.  You develop fever of 102 F (38.9 C) or greater.  You develop worsening pain at or near the incision site. MAKE SURE YOU:   Understand these instructions.  Will watch your condition.  Will get help right away if you are not doing well or get worse. Document Released: 07/29/2006 Document Revised: 06/10/2011 Document Reviewed: 09/29/2006 Exit[employer] Patient Information 2014 Exit[employer], [place].   ________________________________________________________________________  WHAT IS A BLOOD TRANSFUSION? Blood Transfusion Information  A transfusion is the replacement of blood or some of its parts. Blood is made up of multiple cells which provide different functions.  Red blood cells carry oxygen and are used for blood loss replacement.  White blood cells fight against infection.  Platelets control bleeding.  Plasma helps clot blood.  Other blood products are available for specialized needs, such as hemophilia or other clotting disorders. BEFORE THE TRANSFUSION  Who gives blood for transfusions?   Healthy volunteers who are fully evaluated to make sure their blood is safe. This is blood bank blood. Transfusion therapy is the safest it has ever been in the practice of medicine. Before blood is taken from a donor, a complete history is taken to make sure that person has no history of diseases nor engages in risky social behavior (examples are intravenous drug use or sexual activity with multiple partners). The donor's travel history is screened to minimize risk of transmitting infections, such as malaria. The donated blood is tested for signs of  infectious diseases, such as HIV and hepatitis. The blood is then tested to be sure it is compatible with you in order to minimize the chance of a transfusion reaction. If you or a relative donates blood, this is often done in anticipation of surgery and is not appropriate for emergency situations. It takes many days to process the donated blood. RISKS AND COMPLICATIONS Although transfusion therapy is very safe and saves many lives, the main dangers of transfusion include:   Getting an infectious disease.  Developing a transfusion reaction. This is an allergic reaction to something in the blood you were given. Every precaution is taken to prevent this. The decision to have a blood transfusion has been considered carefully by your caregiver before blood is given. Blood is not given unless the benefits outweigh the risks. AFTER THE TRANSFUSION  Right after receiving a blood transfusion, you will usually feel much better and more energetic. This is especially true if your red blood cells have gotten low (anemic). The transfusion raises the level of the red blood cells which carry oxygen, and this usually causes an energy increase.  The nurse administering the transfusion will monitor you  carefully for complications. HOME CARE INSTRUCTIONS  No special instructions are needed after a transfusion. You may find your energy is better. Speak with your caregiver about any limitations on activity for underlying diseases you may have. SEEK MEDICAL CARE IF:   Your condition is not improving after your transfusion.  You develop redness or irritation at the intravenous (IV) site. SEEK IMMEDIATE MEDICAL CARE IF:  Any of the following symptoms occur over the next 12 hours:  Shaking chills.  You have a temperature by mouth above 102 F (38.9 C), not controlled by medicine.  Chest, back, or muscle pain.  People around you feel you are not acting correctly or are confused.  Shortness of breath or  difficulty breathing.  Dizziness and fainting.  You get a rash or develop hives.  You have a decrease in urine output.  Your urine turns a dark color or changes to pink, red, or brown. Any of the following symptoms occur over the next 10 days:  You have a temperature by mouth above 102 F (38.9 C), not controlled by medicine.  Shortness of breath.  Weakness after normal activity.  The white part of the eye turns yellow (jaundice).  You have a decrease in the amount of urine or are urinating less often.  Your urine turns a dark color or changes to pink, red, or brown. Document Released: 03/15/2000 Document Revised: 06/10/2011 Document Reviewed: 11/02/2007 [facility] Patient Information 2014 [place], [place].  _______________________________________________________________________

## 2017-05-05 NOTE — Progress Notes (Signed)
Routed CMP result from 05-05-2017 to dr Marcello Moores via epic .

## 2017-05-05 NOTE — Progress Notes (Addendum)
Called left voicemail message via phone to triage nurse at dr Marcello Moores office requesting cardiac clearance to be faxed.  Pt stated that she did get cardiac clearance.   Current EKG in epic dated 07-04-2016, dr Bronson Ing LOV note dated 01-13-2017 in epic.

## 2017-05-05 NOTE — Progress Notes (Addendum)
Received cardiac clearance dated 04-09-2017 via fax , placed on chart.

## 2017-05-07 ENCOUNTER — Encounter (HOSPITAL_COMMUNITY)
Admission: RE | Disposition: A | Discharge status: Home / Self Care | Payer: Self-pay | Source: Ambulatory Visit | Attending: General Surgery

## 2017-05-07 ENCOUNTER — Inpatient Hospital Stay (HOSPITAL_COMMUNITY): Payer: Medicare Other | Admitting: Certified Registered Nurse Anesthetist

## 2017-05-07 ENCOUNTER — Inpatient Hospital Stay (HOSPITAL_COMMUNITY)
Admission: RE | Admit: 2017-05-07 | Discharge: 2017-05-13 | DRG: 330 | Disposition: A | Discharge status: Home / Self Care | Payer: Medicare Other | Source: Ambulatory Visit | Attending: General Surgery | Admitting: General Surgery

## 2017-05-07 ENCOUNTER — Encounter (HOSPITAL_COMMUNITY): Payer: Self-pay | Admitting: *Deleted

## 2017-05-07 ENCOUNTER — Other Ambulatory Visit: Payer: Self-pay

## 2017-05-07 DIAGNOSIS — K6389 Other specified diseases of intestine: Secondary | ICD-10-CM | POA: Diagnosis present

## 2017-05-07 DIAGNOSIS — Z5331 Laparoscopic surgical procedure converted to open procedure: Secondary | ICD-10-CM

## 2017-05-07 DIAGNOSIS — Z833 Family history of diabetes mellitus: Secondary | ICD-10-CM

## 2017-05-07 DIAGNOSIS — K219 Gastro-esophageal reflux disease without esophagitis: Secondary | ICD-10-CM | POA: Diagnosis present

## 2017-05-07 DIAGNOSIS — Z8249 Family history of ischemic heart disease and other diseases of the circulatory system: Secondary | ICD-10-CM | POA: Diagnosis not present

## 2017-05-07 DIAGNOSIS — Z808 Family history of malignant neoplasm of other organs or systems: Secondary | ICD-10-CM

## 2017-05-07 DIAGNOSIS — I5032 Chronic diastolic (congestive) heart failure: Secondary | ICD-10-CM | POA: Diagnosis present

## 2017-05-07 DIAGNOSIS — Z955 Presence of coronary angioplasty implant and graft: Secondary | ICD-10-CM

## 2017-05-07 DIAGNOSIS — F329 Major depressive disorder, single episode, unspecified: Secondary | ICD-10-CM | POA: Diagnosis present

## 2017-05-07 DIAGNOSIS — I2510 Atherosclerotic heart disease of native coronary artery without angina pectoris: Secondary | ICD-10-CM | POA: Diagnosis present

## 2017-05-07 DIAGNOSIS — K435 Parastomal hernia without obstruction or  gangrene: Secondary | ICD-10-CM | POA: Diagnosis present

## 2017-05-07 DIAGNOSIS — E039 Hypothyroidism, unspecified: Secondary | ICD-10-CM | POA: Diagnosis present

## 2017-05-07 DIAGNOSIS — N183 Chronic kidney disease, stage 3 (moderate): Secondary | ICD-10-CM | POA: Diagnosis present

## 2017-05-07 DIAGNOSIS — C183 Malignant neoplasm of hepatic flexure: Secondary | ICD-10-CM | POA: Diagnosis present

## 2017-05-07 DIAGNOSIS — Z85820 Personal history of malignant melanoma of skin: Secondary | ICD-10-CM | POA: Diagnosis not present

## 2017-05-07 DIAGNOSIS — Z87891 Personal history of nicotine dependence: Secondary | ICD-10-CM

## 2017-05-07 DIAGNOSIS — I130 Hypertensive heart and chronic kidney disease with heart failure and stage 1 through stage 4 chronic kidney disease, or unspecified chronic kidney disease: Secondary | ICD-10-CM | POA: Diagnosis present

## 2017-05-07 DIAGNOSIS — C772 Secondary and unspecified malignant neoplasm of intra-abdominal lymph nodes: Secondary | ICD-10-CM | POA: Diagnosis present

## 2017-05-07 DIAGNOSIS — Z933 Colostomy status: Secondary | ICD-10-CM

## 2017-05-07 DIAGNOSIS — N736 Female pelvic peritoneal adhesions (postinfective): Secondary | ICD-10-CM | POA: Diagnosis present

## 2017-05-07 DIAGNOSIS — K660 Peritoneal adhesions (postprocedural) (postinfection): Secondary | ICD-10-CM | POA: Diagnosis present

## 2017-05-07 HISTORY — PX: LAPAROSCOPIC PARTIAL COLECTOMY: SHX5907

## 2017-05-07 LAB — TYPE AND SCREEN
ABO/RH(D): O POS
Antibody Screen: NEGATIVE

## 2017-05-07 SURGERY — LAPAROSCOPIC PARTIAL COLECTOMY
Anesthesia: General | Site: Abdomen

## 2017-05-07 MED ORDER — MECLIZINE HCL 25 MG PO TABS
25.0000 mg | ORAL_TABLET | Freq: Three times a day (TID) | ORAL | Status: DC | PRN
  Administered 2017-05-09 – 2017-05-10 (×3): 25 mg via ORAL
  Filled 2017-05-07 (×3): qty 1

## 2017-05-07 MED ORDER — ONDANSETRON HCL 4 MG/2ML IJ SOLN
4.0000 mg | Freq: Four times a day (QID) | INTRAMUSCULAR | Status: DC | PRN
  Administered 2017-05-08 – 2017-05-10 (×5): 4 mg via INTRAVENOUS
  Filled 2017-05-07 (×5): qty 2

## 2017-05-07 MED ORDER — ONDANSETRON HCL 4 MG/2ML IJ SOLN
INTRAMUSCULAR | Status: AC
  Filled 2017-05-07: qty 2

## 2017-05-07 MED ORDER — 0.9 % SODIUM CHLORIDE (POUR BTL) OPTIME
TOPICAL | Status: DC | PRN
  Administered 2017-05-07: 2000 mL
  Administered 2017-05-07: 6000 mL

## 2017-05-07 MED ORDER — ALVIMOPAN 12 MG PO CAPS
12.0000 mg | ORAL_CAPSULE | Freq: Two times a day (BID) | ORAL | Status: DC
  Administered 2017-05-08 – 2017-05-11 (×8): 12 mg via ORAL
  Filled 2017-05-07 (×7): qty 1

## 2017-05-07 MED ORDER — ISOSORBIDE MONONITRATE ER 30 MG PO TB24
30.0000 mg | ORAL_TABLET | Freq: Every morning | ORAL | Status: DC
  Administered 2017-05-08 – 2017-05-12 (×5): 30 mg via ORAL
  Filled 2017-05-07 (×5): qty 1

## 2017-05-07 MED ORDER — ALBUTEROL SULFATE (2.5 MG/3ML) 0.083% IN NEBU: 3.0000 mL | INHALATION_SOLUTION | Freq: Four times a day (QID) | RESPIRATORY_TRACT | Status: DC | PRN

## 2017-05-07 MED ORDER — ALBUMIN HUMAN 5 % IV SOLN
INTRAVENOUS | Status: AC
  Filled 2017-05-07: qty 250

## 2017-05-07 MED ORDER — SUCCINYLCHOLINE CHLORIDE 200 MG/10ML IV SOSY
PREFILLED_SYRINGE | INTRAVENOUS | Status: DC | PRN
  Administered 2017-05-07: 100 mg via INTRAVENOUS

## 2017-05-07 MED ORDER — SUGAMMADEX SODIUM 200 MG/2ML IV SOLN
INTRAVENOUS | Status: DC | PRN
  Administered 2017-05-07: 200 mg via INTRAVENOUS

## 2017-05-07 MED ORDER — ALBUMIN HUMAN 5 % IV SOLN
INTRAVENOUS | Status: DC | PRN
  Administered 2017-05-07: 13:00:00 via INTRAVENOUS

## 2017-05-07 MED ORDER — POTASSIUM CHLORIDE ER 10 MEQ PO TBCR
10.0000 meq | EXTENDED_RELEASE_TABLET | Freq: Once | ORAL | Status: AC
  Administered 2017-05-07: 10 meq via ORAL
  Filled 2017-05-07 (×2): qty 1

## 2017-05-07 MED ORDER — POLYVINYL ALCOHOL 1.4 % OP SOLN
1.0000 [drp] | Freq: Every day | OPHTHALMIC | Status: DC | PRN
  Filled 2017-05-07: qty 15

## 2017-05-07 MED ORDER — PANTOPRAZOLE SODIUM 40 MG PO TBEC
80.0000 mg | DELAYED_RELEASE_TABLET | Freq: Every day | ORAL | Status: DC
  Administered 2017-05-08 – 2017-05-12 (×5): 80 mg via ORAL
  Filled 2017-05-07 (×5): qty 2

## 2017-05-07 MED ORDER — ROCURONIUM BROMIDE 50 MG/5ML IV SOSY
PREFILLED_SYRINGE | INTRAVENOUS | Status: DC | PRN
  Administered 2017-05-07: 10 mg via INTRAVENOUS
  Administered 2017-05-07: 30 mg via INTRAVENOUS
  Administered 2017-05-07: 10 mg via INTRAVENOUS
  Administered 2017-05-07: 20 mg via INTRAVENOUS

## 2017-05-07 MED ORDER — FENTANYL CITRATE (PF) 100 MCG/2ML IJ SOLN
25.0000 ug | INTRAMUSCULAR | Status: DC | PRN
  Administered 2017-05-07: 50 ug via INTRAVENOUS
  Administered 2017-05-07: 25 ug via INTRAVENOUS
  Administered 2017-05-07: 50 ug via INTRAVENOUS
  Administered 2017-05-07: 25 ug via INTRAVENOUS
  Administered 2017-05-07: 50 ug via INTRAVENOUS

## 2017-05-07 MED ORDER — BUPIVACAINE-EPINEPHRINE 0.25% -1:200000 IJ SOLN
INTRAMUSCULAR | Status: AC
Start: 2017-05-07 — End: 2017-05-07
  Filled 2017-05-07: qty 1

## 2017-05-07 MED ORDER — ACETAMINOPHEN 500 MG PO TABS
1000.0000 mg | ORAL_TABLET | Freq: Four times a day (QID) | ORAL | Status: AC
  Administered 2017-05-07 – 2017-05-08 (×3): 1000 mg via ORAL
  Filled 2017-05-07 (×3): qty 2

## 2017-05-07 MED ORDER — ENOXAPARIN SODIUM 40 MG/0.4ML ~~LOC~~ SOLN
40.0000 mg | SUBCUTANEOUS | Status: DC
  Administered 2017-05-08: 40 mg via SUBCUTANEOUS
  Filled 2017-05-07: qty 0.4

## 2017-05-07 MED ORDER — ALPRAZOLAM 0.25 MG PO TABS
0.2500 mg | ORAL_TABLET | Freq: Two times a day (BID) | ORAL | Status: DC | PRN
  Administered 2017-05-09 – 2017-05-12 (×4): 0.25 mg via ORAL
  Filled 2017-05-07 (×5): qty 1

## 2017-05-07 MED ORDER — BUPIVACAINE-EPINEPHRINE 0.25% -1:200000 IJ SOLN
INTRAMUSCULAR | Status: DC | PRN
  Administered 2017-05-07: 20 mL

## 2017-05-07 MED ORDER — METOCLOPRAMIDE HCL 5 MG/ML IJ SOLN
INTRAMUSCULAR | Status: AC
  Filled 2017-05-07: qty 2

## 2017-05-07 MED ORDER — SACCHAROMYCES BOULARDII 250 MG PO CAPS
250.0000 mg | ORAL_CAPSULE | Freq: Two times a day (BID) | ORAL | Status: DC
  Administered 2017-05-07 – 2017-05-12 (×11): 250 mg via ORAL
  Filled 2017-05-07 (×11): qty 1

## 2017-05-07 MED ORDER — FENTANYL CITRATE (PF) 100 MCG/2ML IJ SOLN
INTRAMUSCULAR | Status: AC
  Filled 2017-05-07: qty 2

## 2017-05-07 MED ORDER — FENTANYL CITRATE (PF) 100 MCG/2ML IJ SOLN
INTRAMUSCULAR | Status: DC | PRN
  Administered 2017-05-07 (×3): 50 ug via INTRAVENOUS

## 2017-05-07 MED ORDER — DIPHENHYDRAMINE HCL 12.5 MG/5ML PO ELIX: 12.5000 mg | ORAL_SOLUTION | Freq: Four times a day (QID) | ORAL | Status: DC | PRN

## 2017-05-07 MED ORDER — DIPHENHYDRAMINE HCL 50 MG/ML IJ SOLN: 12.5000 mg | Freq: Four times a day (QID) | INTRAMUSCULAR | Status: DC | PRN

## 2017-05-07 MED ORDER — ONDANSETRON HCL 4 MG/2ML IJ SOLN
INTRAMUSCULAR | Status: DC | PRN
  Administered 2017-05-07: 4 mg via INTRAVENOUS

## 2017-05-07 MED ORDER — GLYCOPYRROLATE 0.2 MG/ML IJ SOLN
INTRAMUSCULAR | Status: DC | PRN
  Administered 2017-05-07: 0.2 mg via INTRAVENOUS

## 2017-05-07 MED ORDER — ROSUVASTATIN CALCIUM 10 MG PO TABS
10.0000 mg | ORAL_TABLET | Freq: Every day | ORAL | Status: DC
  Administered 2017-05-08 – 2017-05-12 (×5): 10 mg via ORAL
  Filled 2017-05-07 (×5): qty 1

## 2017-05-07 MED ORDER — LIDOCAINE 2% (20 MG/ML) 5 ML SYRINGE
INTRAMUSCULAR | Status: DC | PRN
  Administered 2017-05-07: 1.5 mg/kg/h via INTRAVENOUS

## 2017-05-07 MED ORDER — LIDOCAINE 2% (20 MG/ML) 5 ML SYRINGE
INTRAMUSCULAR | Status: DC | PRN
  Administered 2017-05-07 (×2): 50 mg via INTRAVENOUS

## 2017-05-07 MED ORDER — ZOLPIDEM TARTRATE 5 MG PO TABS
5.0000 mg | ORAL_TABLET | Freq: Every evening | ORAL | Status: DC | PRN
  Administered 2017-05-09 – 2017-05-12 (×4): 5 mg via ORAL
  Filled 2017-05-07 (×4): qty 1

## 2017-05-07 MED ORDER — LACTATED RINGERS IV SOLN
INTRAVENOUS | Status: DC
  Administered 2017-05-07: 1000 mL via INTRAVENOUS
  Administered 2017-05-07: 10:00:00 via INTRAVENOUS

## 2017-05-07 MED ORDER — ALVIMOPAN 12 MG PO CAPS
12.0000 mg | ORAL_CAPSULE | ORAL | Status: AC
  Administered 2017-05-07: 12 mg via ORAL
  Filled 2017-05-07: qty 1

## 2017-05-07 MED ORDER — ALUM & MAG HYDROXIDE-SIMETH 200-200-20 MG/5ML PO SUSP: 30.0000 mL | Freq: Four times a day (QID) | ORAL | Status: DC | PRN

## 2017-05-07 MED ORDER — LEVOTHYROXINE SODIUM 50 MCG PO TABS
50.0000 ug | ORAL_TABLET | Freq: Every day | ORAL | Status: DC
  Administered 2017-05-08 – 2017-05-13 (×6): 50 ug via ORAL
  Filled 2017-05-07 (×6): qty 1

## 2017-05-07 MED ORDER — ROCURONIUM BROMIDE 10 MG/ML (PF) SYRINGE
PREFILLED_SYRINGE | INTRAVENOUS | Status: AC
  Filled 2017-05-07: qty 5

## 2017-05-07 MED ORDER — BUPIVACAINE LIPOSOME 1.3 % IJ SUSP
20.0000 mL | Freq: Once | INTRAMUSCULAR | Status: AC
  Administered 2017-05-07: 20 mL
  Filled 2017-05-07: qty 20

## 2017-05-07 MED ORDER — LOSARTAN POTASSIUM 50 MG PO TABS
50.0000 mg | ORAL_TABLET | Freq: Every morning | ORAL | Status: DC
  Administered 2017-05-08 – 2017-05-12 (×5): 50 mg via ORAL
  Filled 2017-05-07 (×5): qty 1

## 2017-05-07 MED ORDER — DEXAMETHASONE SODIUM PHOSPHATE 4 MG/ML IJ SOLN
INTRAMUSCULAR | Status: DC | PRN
  Administered 2017-05-07: 10 mg via INTRAVENOUS

## 2017-05-07 MED ORDER — KCL IN DEXTROSE-NACL 30-5-0.45 MEQ/L-%-% IV SOLN
INTRAVENOUS | Status: DC
  Administered 2017-05-07 – 2017-05-11 (×7): via INTRAVENOUS
  Administered 2017-05-11: 1000 mL via INTRAVENOUS
  Filled 2017-05-07 (×11): qty 1000

## 2017-05-07 MED ORDER — LACTATED RINGERS IR SOLN
Status: DC | PRN
  Administered 2017-05-07: 1000 mL

## 2017-05-07 MED ORDER — LIP MEDEX EX OINT
TOPICAL_OINTMENT | CUTANEOUS | Status: AC
  Filled 2017-05-07: qty 7

## 2017-05-07 MED ORDER — MONTELUKAST SODIUM 10 MG PO TABS
10.0000 mg | ORAL_TABLET | Freq: Every day | ORAL | Status: DC
  Administered 2017-05-07 – 2017-05-12 (×6): 10 mg via ORAL
  Filled 2017-05-07 (×6): qty 1

## 2017-05-07 MED ORDER — CEFOTETAN DISODIUM-DEXTROSE 2-2.08 GM-%(50ML) IV SOLR
2.0000 g | INTRAVENOUS | Status: AC
  Administered 2017-05-07: 2 g via INTRAVENOUS
  Filled 2017-05-07: qty 50

## 2017-05-07 MED ORDER — ONDANSETRON HCL 4 MG PO TABS
4.0000 mg | ORAL_TABLET | Freq: Four times a day (QID) | ORAL | Status: DC | PRN
  Administered 2017-05-12: 4 mg via ORAL
  Filled 2017-05-07: qty 1

## 2017-05-07 MED ORDER — SUCCINYLCHOLINE CHLORIDE 200 MG/10ML IV SOSY
PREFILLED_SYRINGE | INTRAVENOUS | Status: AC
  Filled 2017-05-07: qty 10

## 2017-05-07 MED ORDER — LIDOCAINE 2% (20 MG/ML) 5 ML SYRINGE
INTRAMUSCULAR | Status: AC
  Filled 2017-05-07: qty 5

## 2017-05-07 MED ORDER — PROPOFOL 10 MG/ML IV BOLUS
INTRAVENOUS | Status: DC | PRN
  Administered 2017-05-07: 80 mg via INTRAVENOUS

## 2017-05-07 MED ORDER — LIDOCAINE 2% (20 MG/ML) 5 ML SYRINGE
INTRAMUSCULAR | Status: AC
  Filled 2017-05-07: qty 10

## 2017-05-07 MED ORDER — EPHEDRINE 5 MG/ML INJ
INTRAVENOUS | Status: AC
  Filled 2017-05-07: qty 10

## 2017-05-07 MED ORDER — ACETAMINOPHEN 500 MG PO TABS
1000.0000 mg | ORAL_TABLET | ORAL | Status: AC
  Administered 2017-05-07: 1000 mg via ORAL
  Filled 2017-05-07: qty 2

## 2017-05-07 MED ORDER — HYDRALAZINE HCL 50 MG PO TABS
50.0000 mg | ORAL_TABLET | Freq: Three times a day (TID) | ORAL | Status: DC
  Administered 2017-05-08 – 2017-05-12 (×15): 50 mg via ORAL
  Filled 2017-05-07 (×15): qty 1

## 2017-05-07 MED ORDER — NITROGLYCERIN 0.4 MG SL SUBL: 0.4000 mg | SUBLINGUAL_TABLET | SUBLINGUAL | Status: DC | PRN

## 2017-05-07 MED ORDER — METOPROLOL SUCCINATE ER 25 MG PO TB24
25.0000 mg | ORAL_TABLET | Freq: Every day | ORAL | Status: DC
  Administered 2017-05-08 – 2017-05-12 (×5): 25 mg via ORAL
  Filled 2017-05-07 (×5): qty 1

## 2017-05-07 MED ORDER — FUROSEMIDE 20 MG PO TABS
20.0000 mg | ORAL_TABLET | Freq: Every day | ORAL | Status: DC
  Administered 2017-05-10 – 2017-05-12 (×3): 20 mg via ORAL
  Filled 2017-05-07 (×3): qty 1

## 2017-05-07 MED ORDER — LACTATED RINGERS IV SOLN
INTRAVENOUS | Status: DC | PRN
  Administered 2017-05-07: 11:00:00 via INTRAVENOUS

## 2017-05-07 MED ORDER — PROPOFOL 10 MG/ML IV BOLUS
INTRAVENOUS | Status: AC
  Filled 2017-05-07: qty 20

## 2017-05-07 MED ORDER — DEXTROSE 5 % IV SOLN
2.0000 g | Freq: Two times a day (BID) | INTRAVENOUS | Status: AC
  Administered 2017-05-07: 2 g via INTRAVENOUS
  Filled 2017-05-07: qty 2

## 2017-05-07 MED ORDER — METOCLOPRAMIDE HCL 5 MG/ML IJ SOLN
5.0000 mg | Freq: Once | INTRAMUSCULAR | Status: AC
  Administered 2017-05-07: 5 mg via INTRAVENOUS

## 2017-05-07 MED ORDER — LORATADINE 10 MG PO TABS
10.0000 mg | ORAL_TABLET | Freq: Every day | ORAL | Status: DC
  Administered 2017-05-07 – 2017-05-12 (×6): 10 mg via ORAL
  Filled 2017-05-07 (×6): qty 1

## 2017-05-07 MED ORDER — TRAZODONE HCL 100 MG PO TABS
100.0000 mg | ORAL_TABLET | Freq: Every day | ORAL | Status: DC
  Administered 2017-05-07 – 2017-05-12 (×6): 100 mg via ORAL
  Filled 2017-05-07 (×6): qty 1

## 2017-05-07 MED ORDER — DEXAMETHASONE SODIUM PHOSPHATE 10 MG/ML IJ SOLN
INTRAMUSCULAR | Status: AC
  Filled 2017-05-07: qty 1

## 2017-05-07 MED ORDER — EPHEDRINE SULFATE-NACL 50-0.9 MG/10ML-% IV SOSY
PREFILLED_SYRINGE | INTRAVENOUS | Status: DC | PRN
  Administered 2017-05-07 (×3): 5 mg via INTRAVENOUS
  Administered 2017-05-07 (×2): 10 mg via INTRAVENOUS
  Administered 2017-05-07: 5 mg via INTRAVENOUS
  Administered 2017-05-07: 10 mg via INTRAVENOUS
  Administered 2017-05-07 (×2): 5 mg via INTRAVENOUS
  Administered 2017-05-07: 10 mg via INTRAVENOUS

## 2017-05-07 MED ORDER — FENTANYL CITRATE (PF) 250 MCG/5ML IJ SOLN
INTRAMUSCULAR | Status: AC
  Filled 2017-05-07: qty 5

## 2017-05-07 MED ORDER — AMLODIPINE BESYLATE 5 MG PO TABS
5.0000 mg | ORAL_TABLET | Freq: Every day | ORAL | Status: DC
  Administered 2017-05-08 – 2017-05-12 (×5): 5 mg via ORAL
  Filled 2017-05-07 (×5): qty 1

## 2017-05-07 MED ORDER — HYDROMORPHONE HCL 1 MG/ML IJ SOLN
0.5000 mg | INTRAMUSCULAR | Status: DC | PRN
  Administered 2017-05-07: 1 mg via INTRAVENOUS
  Administered 2017-05-08 – 2017-05-09 (×3): 0.5 mg via INTRAVENOUS
  Administered 2017-05-10 – 2017-05-12 (×4): 1 mg via INTRAVENOUS
  Filled 2017-05-07 (×8): qty 1

## 2017-05-07 MED ORDER — CITALOPRAM HYDROBROMIDE 20 MG PO TABS
40.0000 mg | ORAL_TABLET | Freq: Every day | ORAL | Status: DC
  Administered 2017-05-08 – 2017-05-12 (×5): 40 mg via ORAL
  Filled 2017-05-07 (×6): qty 2

## 2017-05-07 SURGICAL SUPPLY — 77 items
ADH SKN CLS APL DERMABOND .7 (GAUZE/BANDAGES/DRESSINGS)
APPLIER CLIP 5 13 M/L LIGAMAX5 (MISCELLANEOUS)
APR CLP MED LRG 5 ANG JAW (MISCELLANEOUS)
BLADE EXTENDED COATED 6.5IN (ELECTRODE) IMPLANT
CABLE HIGH FREQUENCY MONO STRZ (ELECTRODE) ×1 IMPLANT
CELLS DAT CNTRL 66122 CELL SVR (MISCELLANEOUS) IMPLANT
CHLORAPREP W/TINT 26ML (MISCELLANEOUS) ×1 IMPLANT
CLIP APPLIE 5 13 M/L LIGAMAX5 (MISCELLANEOUS) IMPLANT
DECANTER SPIKE VIAL GLASS SM (MISCELLANEOUS) ×3 IMPLANT
DERMABOND ADVANCED (GAUZE/BANDAGES/DRESSINGS)
DERMABOND ADVANCED .7 DNX12 (GAUZE/BANDAGES/DRESSINGS) ×1 IMPLANT
DRAIN CHANNEL 19F RND (DRAIN) ×2 IMPLANT
DRAPE LAPAROSCOPIC ABDOMINAL (DRAPES) ×1 IMPLANT
DRAPE SURG IRRIG POUCH 19X23 (DRAPES) ×1 IMPLANT
DRSG OPSITE POSTOP 4X10 (GAUZE/BANDAGES/DRESSINGS) IMPLANT
DRSG OPSITE POSTOP 4X6 (GAUZE/BANDAGES/DRESSINGS) IMPLANT
DRSG OPSITE POSTOP 4X8 (GAUZE/BANDAGES/DRESSINGS) ×2 IMPLANT
ELECT PENCIL ROCKER SW 15FT (MISCELLANEOUS) ×4 IMPLANT
ELECT REM PT RETURN 15FT ADLT (MISCELLANEOUS) ×3 IMPLANT
EVACUATOR SILICONE 100CC (DRAIN) ×2 IMPLANT
GAUZE SPONGE 2X2 8PLY STRL LF (GAUZE/BANDAGES/DRESSINGS) IMPLANT
GAUZE SPONGE 4X4 12PLY STRL (GAUZE/BANDAGES/DRESSINGS) IMPLANT
GLOVE BIO SURGEON STRL SZ 6.5 (GLOVE) ×4 IMPLANT
GLOVE BIO SURGEONS STRL SZ 6.5 (GLOVE) ×2
GLOVE BIOGEL PI IND STRL 7.0 (GLOVE) ×2 IMPLANT
GLOVE BIOGEL PI INDICATOR 7.0 (GLOVE) ×4
GOWN STRL REUS W/TWL 2XL LVL3 (GOWN DISPOSABLE) ×6 IMPLANT
GOWN STRL REUS W/TWL XL LVL3 (GOWN DISPOSABLE) ×16 IMPLANT
GRASPER ENDOPATH ANVIL 10MM (MISCELLANEOUS) IMPLANT
HOLDER FOLEY CATH W/STRAP (MISCELLANEOUS) ×3 IMPLANT
IRRIG SUCT STRYKERFLOW 2 WTIP (MISCELLANEOUS) ×3
IRRIGATION SUCT STRKRFLW 2 WTP (MISCELLANEOUS) ×1 IMPLANT
LUBRICANT JELLY K Y 4OZ (MISCELLANEOUS) ×1 IMPLANT
PACK COLON (CUSTOM PROCEDURE TRAY) ×3 IMPLANT
PAD POSITIONING PINK XL (MISCELLANEOUS) ×3 IMPLANT
PORT LAP GEL ALEXIS MED 5-9CM (MISCELLANEOUS) ×3 IMPLANT
POSITIONER SURGICAL ARM (MISCELLANEOUS) ×3 IMPLANT
RELOAD PROXIMATE 75MM BLUE (ENDOMECHANICALS) ×6 IMPLANT
RELOAD STAPLE 75 3.8 BLU REG (ENDOMECHANICALS) IMPLANT
RETRACTOR WND ALEXIS 18 MED (MISCELLANEOUS) IMPLANT
RETRACTOR WND ALEXIS 25 LRG (MISCELLANEOUS) IMPLANT
RTRCTR WOUND ALEXIS 18CM MED (MISCELLANEOUS)
RTRCTR WOUND ALEXIS 25CM LRG (MISCELLANEOUS) ×3
SCISSORS METZENBAUM CVD 33 (INSTRUMENTS) ×3 IMPLANT
SEALER TISSUE G2 STRG ARTC 35C (ENDOMECHANICALS) ×2 IMPLANT
SEALER TISSUE X1 CVD JAW (INSTRUMENTS) IMPLANT
SLEEVE SURGEON STRL (DRAPES) ×2 IMPLANT
SLEEVE XCEL OPT CAN 5 100 (ENDOMECHANICALS) ×3 IMPLANT
SPONGE DRAIN TRACH 4X4 STRL 2S (GAUZE/BANDAGES/DRESSINGS) ×2 IMPLANT
SPONGE GAUZE 2X2 STER 10/PKG (GAUZE/BANDAGES/DRESSINGS) ×2
SPONGE LAP 18X18 X RAY DECT (DISPOSABLE) ×6 IMPLANT
STAPLER GUN LINEAR PROX 60 (STAPLE) ×2 IMPLANT
STAPLER PROXIMATE 75MM BLUE (STAPLE) ×2 IMPLANT
STAPLER VISISTAT 35W (STAPLE) IMPLANT
SUT ETHILON 2 0 PS N (SUTURE) ×2 IMPLANT
SUT NOVA NAB GS-21 0 18 T12 DT (SUTURE) ×6 IMPLANT
SUT PDS AB 1 CTX 36 (SUTURE) ×4 IMPLANT
SUT PDS AB 1 TP1 96 (SUTURE) IMPLANT
SUT PROLENE 2 0 KS (SUTURE) ×1 IMPLANT
SUT SILK 2 0 (SUTURE) ×3
SUT SILK 2 0 SH CR/8 (SUTURE) ×3 IMPLANT
SUT SILK 2-0 18XBRD TIE 12 (SUTURE) ×1 IMPLANT
SUT SILK 3 0 (SUTURE) ×3
SUT SILK 3 0 SH CR/8 (SUTURE) ×5 IMPLANT
SUT SILK 3-0 18XBRD TIE 12 (SUTURE) ×1 IMPLANT
SUT VIC AB 2-0 SH 18 (SUTURE) ×3 IMPLANT
SUT VIC AB 4-0 PS2 27 (SUTURE) ×3 IMPLANT
SYS LAPSCP GELPORT 120MM (MISCELLANEOUS)
SYSTEM LAPSCP GELPORT 120MM (MISCELLANEOUS) IMPLANT
TOWEL OR NON WOVEN STRL DISP B (DISPOSABLE) ×3 IMPLANT
TRAY FOLEY W/METER SILVER 16FR (SET/KITS/TRAYS/PACK) IMPLANT
TROCAR ADV FIXATION 5X100MM (TROCAR) ×2 IMPLANT
TROCAR BLADELESS OPT 5 100 (ENDOMECHANICALS) ×3 IMPLANT
TROCAR XCEL BLUNT TIP 100MML (ENDOMECHANICALS) IMPLANT
TUBING CONNECTING 10 (TUBING) ×3 IMPLANT
TUBING CONNECTING 10' (TUBING) ×2
TUBING INSUF HEATED (TUBING) ×3 IMPLANT

## 2017-05-07 NOTE — Anesthesia Preprocedure Evaluation (Signed)
Anesthesia Evaluation  Patient identified by MRN, date of birth, ID band Patient awake    Reviewed: Allergy & Precautions, NPO status   History of Anesthesia Complications (+) PONV  Airway Mallampati: II  TM Distance: >3 FB     Dental   Pulmonary neg pulmonary ROS, former smoker,    breath sounds clear to auscultation       Cardiovascular hypertension, + CAD and +CHF   Rhythm:Regular Rate:Normal     Neuro/Psych Anxiety Depression TIA Neuromuscular disease    GI/Hepatic hiatal hernia, PUD, GERD  ,  Endo/Other  Hypothyroidism   Renal/GU Renal disease     Musculoskeletal   Abdominal   Peds  Hematology  (+) anemia ,   Anesthesia Other Findings   Reproductive/Obstetrics                             Anesthesia Physical Anesthesia Plan  ASA: III  Anesthesia Plan: General   Post-op Pain Management:    Induction: Intravenous  PONV Risk Score and Plan: 4 or greater and Treatment may vary due to age or medical condition, Dexamethasone and Midazolam  Airway Management Planned:   Additional Equipment:   Intra-op Plan:   Post-operative Plan: Possible Post-op intubation/ventilation  Informed Consent: I have reviewed the patients History and Physical, chart, labs and discussed the procedure including the risks, benefits and alternatives for the proposed anesthesia with the patient or authorized representative who has indicated his/her understanding and acceptance.   Dental advisory given  Plan Discussed with: CRNA, Anesthesiologist and Surgeon  Anesthesia Plan Comments:         Anesthesia Quick Evaluation

## 2017-05-07 NOTE — Anesthesia Postprocedure Evaluation (Signed)
Anesthesia Post Note  Patient: [name]  Procedure(s) Performed: LAPAROSCOPIC  CONVERTED TO OPEN RIGHT COLECTOMY, PARASTOMAL HERNIA  REPAIR (N/A Abdomen)     Patient location during evaluation: PACU Anesthesia Type: General Level of consciousness: awake Pain management: pain level controlled Vital Signs Assessment: post-procedure vital signs reviewed and stable Respiratory status: spontaneous breathing Cardiovascular status: stable Anesthetic complications: no    Last Vitals:  Vitals:   05/07/17 0956  BP: (!) 141/66  Pulse: (!) 53  Resp: 18  Temp: 36.5 C  SpO2: 100%    Last Pain:  Vitals:   05/07/17 0956  TempSrc: Oral                 Odean Fester

## 2017-05-07 NOTE — Interval H&P Note (Signed)
History and Physical Interval Note:  05/07/2017 10:04 AM  [name]  has presented today for surgery, with the diagnosis of COLON POLYP  The various methods of treatment have been discussed with the patient and family. After consideration of risks, benefits and other options for treatment, the patient has consented to  Procedure(s): LAPAROSCOPIC PARTIAL COLECTOMY, PARASTOMAL HERNIA REPAIR (N/A) as a surgical intervention .  The patient's history has been reviewed, patient examined, no change in status, stable for surgery.  I have reviewed the patient's chart and labs.  Questions were answered to the patient's satisfaction.     Rosario Adie, MD  [facility]

## 2017-05-07 NOTE — Transfer of Care (Signed)
Immediate Anesthesia Transfer of Care Note  Patient: [name]  Procedure(s) Performed: Procedure(s): LAPAROSCOPIC  CONVERTED TO OPEN RIGHT COLECTOMY, PARASTOMAL HERNIA  REPAIR (N/A)  Patient Location: PACU  Anesthesia Type:General  Level of Consciousness: Patient easily awoken, sedated, comfortable, cooperative, following commands, responds to stimulation.   Airway & Oxygen Therapy: Patient spontaneously breathing, ventilating well, oxygen via simple oxygen mask.  Post-op Assessment: Report given to PACU RN, vital signs reviewed and stable, moving all extremities.   Post vital signs: Reviewed and stable.  Complications: No apparent anesthesia complications  Last Vitals:  Vitals:   05/07/17 0956  BP: (!) 141/66  Pulse: (!) 53  Resp: 18  Temp: 36.5 C  SpO2: 100%    Last Pain:  Vitals:   05/07/17 0956  TempSrc: Oral      Patients Stated Pain Goal: 4 (81/59/47 0761)  Complications: No apparent anesthesia complications

## 2017-05-07 NOTE — Op Note (Addendum)
05/07/2017  2:15 PM  PATIENT:  [name]  83 y.o. female  Patient Care Team: Terald Sleeper, PA-C as PCP - General ([place]) Okey Regal, [place] (Optometry) Herminio Commons, MD as Attending Physician (Cardiology)  PRE-OPERATIVE DIAGNOSIS:  COLON POLYP, PARASTOMAL HERNIA  POST-OPERATIVE DIAGNOSIS:  COLON POLYP PARASTOMAL HERNIA  PROCEDURE:  LAPAROSCOPIC CONVERTED TO OPEN RIGHT COLECTOMY, PRIMARY PARASTOMAL HERNIA REPAIR   Surgeon(s): Leighton Ruff, MD Ileana Roup, MD  ASSISTANT: Dr Dema Severin   ANESTHESIA:   general  EBL: 166ml  Total I/O In: 1200 [I.V.:950; IV Piggyback:250] Out: 46 [Urine:70]  DRAINS: (25F) Jackson-Pratt drain(s) with closed bulb suction in the pelvis   SPECIMEN:  Source of Specimen:  R colon and terminal ileum  DISPOSITION OF SPECIMEN:  PATHOLOGY  COUNTS:  YES  PLAN OF CARE: Admit to inpatient   PATIENT DISPOSITION:  PACU - hemodynamically stable.  INDICATION: 83 y.o. F with a history of multiple abdominal operations who presents to my office with an endoscopically unresectable hepatic flexure polyp.  Patient has a history of Hartman's procedure and a known parastomal hernia as well.  I recommended right colectomy and parastomal repair.   OR FINDINGS: Hepatic flexure colon mass.  Significant pelvic adhesions.  Hiatal hernia.  Parastomal hernia.  DESCRIPTION: the patient was identified in the preoperative holding area and taken to the OR where they were laid supine on the operating room table.  General anesthesia was induced without difficulty. SCDs were also noted to be in place prior to the initiation of anesthesia.  The patient was then prepped and draped in the usual sterile fashion.   A surgical timeout was performed indicating the correct patient, procedure, positioning and need for preoperative antibiotics.   I began by making a small upper midline incision through her previous scar.  This was carried down through the  subcutaneous tissue using electrocautery.  The fascia was incised at midline.  The peritoneum was entered bluntly.  Adhesions were swept away from midline.  The incision was widened.  Several abdominal wall adhesions were taken down using Metzenbaum scissors.  An [place] wound protector was placed.  Was placed on the Alexis and the abdomen was insufflated to approximately 15 mmHg.  The abdomen was evaluated laparoscopically.  There were multiple adhesions to the abdominal wall which were taken down using sharp dissection.  A tattoo was found in the hepatic flexure.  I follow the colon down the right pericolic gutter and the cecum was noted to be in the pelvis under dense adhesions.  There was a loop of small bowel that was adherent to the right side of the pelvis.  This was taken down using sharp dissection.  I continued to mobilize the cecum but I was unable to get good visualization for adhesio lysis due to the colostomy site being in the way.  I decided the safest thing to do would be to extend my incision and perform the remainder of the adhesio lysis under direct visualization.  Before I did this I did mobilize the hepatic flexure using the Ensure device.  I took down the retroperitoneal attachments to the duodenum as well using blunt dissection.  Once this was completed, the port was removed and the incision was extended inferiorly.  I used 2 Coker clamps to elevate the fascia and began to lyse adhesions to the anterior abdominal wall down to the pelvis.  Several loops of small bowel were freed from their adhesions to the anterior wall.  Once  this was complete I was able to visualize the terminal ileum and cecum adherent to the Hartman's pouch deep in the pelvis.  Once all other structures were out of the way I began to bluntly mobilize the cecum and terminal ileum.  Once these were brought out of the pelvis, I divided the terminal ileum using a blue load GIA 75 mm stapler.  I then divided the transverse colon  approximately 5 cm distal to the hepatic flexure mass also using a GIA blue load 64mm stapler.  Once this was complete identified the ileocolic artery.  I went down to its takeoff at the base and divided this using the Enseal device.  Hemostasis was good.  The remaining mesentery was also divided using the Enseal device.  I also divided the right branch of the middle colic in similar fashion.  Once this was complete the specimen was sent to pathology for further examination.  A small enterotomy was placed in the terminal ileum and in the transverse colon.  An anastomosis was created using a 75 mm GIA blue load stapler.  The common enterotomy channel was then closed with a TA 60 mm blue load stapler.  The staple line was imbricated using interrupted 3-0 silk sutures.  This was then placed back into the abdomen.  The entire small bowel was ran from terminal ileum to ligament of Treitz.  There were no injuries noted.  The pelvis was inspected.  Hemostasis was good.  I decided to leave a 31 [place] Blake drain in the pelvis due to the difficult dissection.  This was brought out through the right lower quadrant incision site from the previous laparoscopic port.  It was sutured into place with a 2-0 nylon suture.  The abdomen was then irrigated with several liters of warm normal saline.  Identified the patient's colostomy site.  There was a large parastomal hernia lateral to the colostomy.  This appeared to be due to the fact the colon was not seated within the rectus, but lateral to the rectus.  Given the extent of her surgery, I decided to just close this lateral portion with #1 interrupted Novafil sutures.  I then used 3 Vicryl sutures to tack the colon to the abdominal wall to prevent the colon from sliding into the hernia sac.  Once this was completed, we switched to clean gowns, gloves, instruments and drapes.  The fascia was closed using 2 #1 running PDS sutures.  The skin was closed with staples.  The remaining  port sites were also closed with staples.  Sterile dressings were applied.  The patient was awakened from anesthesia and sent to the postanesthesia care unit in stable condition.  All counts were correct per operating room staff.

## 2017-05-07 NOTE — Anesthesia Procedure Notes (Signed)
Procedure Name: Intubation Date/Time: 05/07/2017 11:12 AM Performed by: Deliah Boston, CRNA Pre-anesthesia Checklist: Patient identified, Emergency Drugs available, Suction available and Patient being monitored Patient Re-evaluated:Patient Re-evaluated prior to induction Oxygen Delivery Method: Circle system utilized Preoxygenation: Pre-oxygenation with 100% oxygen Induction Type: IV induction Ventilation: Mask ventilation without difficulty Laryngoscope Size: Mac and 3 Grade View: Grade I Tube type: Oral Tube size: 7.0 mm Number of attempts: 1 Airway Equipment and Method: Stylet and Oral airway Placement Confirmation: ETT inserted through vocal cords under direct vision,  positive ETCO2 and breath sounds checked- equal and bilateral Secured at: 21 cm Tube secured with: Tape Dental Injury: Teeth and Oropharynx as per pre-operative assessment

## 2017-05-08 ENCOUNTER — Encounter (HOSPITAL_COMMUNITY): Payer: Self-pay | Admitting: General Surgery

## 2017-05-08 LAB — BASIC METABOLIC PANEL
ANION GAP: 8 (ref 5–15)
BUN: 20 mg/dL (ref 6–20)
CO2: 22 mmol/L (ref 22–32)
Calcium: 8.5 mg/dL — ABNORMAL LOW (ref 8.9–10.3)
Chloride: 103 mmol/L (ref 101–111)
Creatinine, Ser: 1.52 mg/dL — ABNORMAL HIGH (ref 0.44–1.00)
GFR, EST AFRICAN AMERICAN: 36 mL/min — AB (ref >60)
GFR, EST NON AFRICAN AMERICAN: 31 mL/min — AB (ref >60)
GLUCOSE: 153 mg/dL — AB (ref 65–99)
POTASSIUM: 4.3 mmol/L (ref 3.5–5.1)
Sodium: 133 mmol/L — ABNORMAL LOW (ref 135–145)

## 2017-05-08 LAB — CBC
HEMATOCRIT: 31.8 % — AB (ref 36.0–46.0)
HEMOGLOBIN: 10.4 g/dL — AB (ref 12.0–15.0)
MCH: 26.5 pg (ref 26.0–34.0)
MCHC: 32.7 g/dL (ref 30.0–36.0)
MCV: 81.1 fL (ref 78.0–100.0)
Platelets: 195 10*3/uL (ref 150–400)
RBC: 3.92 MIL/uL (ref 3.87–5.11)
RDW: 15 % (ref 11.5–15.5)
WBC: 12.8 10*3/uL — ABNORMAL HIGH (ref 4.0–10.5)

## 2017-05-08 MED ORDER — ENOXAPARIN SODIUM 30 MG/0.3ML ~~LOC~~ SOLN
30.0000 mg | SUBCUTANEOUS | Status: DC
  Administered 2017-05-09: 30 mg via SUBCUTANEOUS
  Filled 2017-05-08: qty 0.3

## 2017-05-08 NOTE — Progress Notes (Signed)
PT Cancellation Note  Patient Details Name: [name] MRN: [id number] DOB: 11/18/1934   Cancelled Treatment:    Reason Eval/Treat Not Completed: Medical issues which prohibited therapy(pt is having nausea, RN to bring meds, will check back. )   Philomena Doheny 05/08/2017, 10:26 AM [phone]

## 2017-05-08 NOTE — Progress Notes (Signed)
1 Day Post-Op lap converted to open R colectomy and parastomal hernia repair. Subjective: Feels well.  No flatus.  No nausea but belching some.  Has ambulated twice  Objective: Vital signs in last 24 hours: Temp:  [97.6 F (36.4 C)-98.5 F (36.9 C)] 98.2 F (36.8 C) (02/07 0507) Pulse Rate:  [53-88] 75 (02/07 0507) Resp:  [11-18] 17 (02/07 0507) BP: (140-176)/(52-80) 155/55 (02/07 0507) SpO2:  [99 %-100 %] 100 % (02/07 0507) Weight:  [65.3 kg (144 lb)] 65.3 kg (144 lb) (02/06 1005)   Intake/Output from previous day: 02/06 0701 - 02/07 0700 In: 1808.8 [P.O.:20; I.V.:1538.8; IV Piggyback:250] Out: 1045 [Urine:550; Drains:370; Blood:125] Intake/Output this shift: No intake/output data recorded.   General appearance: alert and cooperative GI: soft, nondistended JP: SS fluid Incision: no significant drainage  Lab Results:  Recent Labs    05/05/17 0934 05/08/17 0545  WBC 5.1 12.8*  HGB 11.5* 10.4*  HCT 35.8* 31.8*  PLT 181 195   BMET Recent Labs    05/05/17 0934 05/08/17 0545  NA 137 133*  K 4.5 4.3  CL 102 103  CO2 29 22  GLUCOSE 87 153*  BUN 24* 20  CREATININE 1.62* 1.52*  CALCIUM 8.7* 8.5*   PT/INR No results for input(s): LABPROT, INR in the last 72 hours. ABG No results for input(s): PHART, HCO3 in the last 72 hours.  Invalid input(s): PCO2, PO2  MEDS, Scheduled . acetaminophen  1,000 mg Oral Q6H  . alvimopan  12 mg Oral BID  . amLODipine  5 mg Oral Daily  . citalopram  40 mg Oral Daily  . enoxaparin (LOVENOX) injection  40 mg Subcutaneous Q24H  . [START ON 05/10/2017] furosemide  20 mg Oral Daily  . hydrALAZINE  50 mg Oral TID  . isosorbide mononitrate  30 mg Oral q morning - 10a  . levothyroxine  50 mcg Oral QAC breakfast  . loratadine  10 mg Oral QHS  . losartan  50 mg Oral q morning - 10a  . metoprolol succinate  25 mg Oral Daily  . montelukast  10 mg Oral QHS  . pantoprazole  80 mg Oral Q1200  . rosuvastatin  10 mg Oral q1800  .  saccharomyces boulardii  250 mg Oral BID  . traZODone  100 mg Oral QHS    Studies/Results: No results found.  Assessment: s/p Procedure(s): LAPAROSCOPIC  CONVERTED TO OPEN RIGHT COLECTOMY, PARASTOMAL HERNIA  REPAIR Patient Active Problem List   Diagnosis Date Noted  . Irritable bowel syndrome with constipation 10/15/2016  . Iron deficiency 08/13/2016  . Symptomatic cholelithiasis 07/18/2016  . Discitis of lumbar region L5- S1 03/01/2016  . Epidural abscess L5- S1 03/01/2016  . Chronic diastolic CHF (congestive heart failure) ([place]) 03/01/2016  . DDD (degenerative disc disease), lumbar 02/12/2016  . History of GI bleed 12/18/2015  . Gastric volvulus 08/29/2015  . Erosive esophagitis 08/29/2015  . Atrial fibrillation with RVR ([place]) 08/09/2015  . Irreducible hiatal hernia   . TIA (transient ischemic attack)   . Gastric outlet obstruction   . Pressure ulcer 06/17/2015  . Essential hypertension   . Colonic mass 05/02/2015  . Anemia due to chronic blood loss 05/02/2015  . PAF (paroxysmal atrial fibrillation) ([place]) 05/02/2015  . Anxiety and depression 05/02/2015  . CKD (chronic kidney disease) stage 3, GFR 30-59 ml/min (HCC) 03/03/2015  . History of percutaneous coronary intervention   . Coronary artery disease   . HYPERCHOLESTEROLEMIA 07/16/2007    Expected post op course  Plan:  Cont NPO until bowel function  Cont to ambulate Cont IVF's D/c foley in AM   LOS: 1 day     .Rosario Adie, [facility], [place]   05/08/2017 7:30 AM

## 2017-05-08 NOTE — Evaluation (Signed)
Physical Therapy Evaluation Patient Details Name: [name] MRN: [id number] DOB: 07-13-1934 Today's Date: 05/08/2017   History of Present Illness  82 y.o. female with PMH of afib, CAD,CHF, L5-S1 discitis (December 2017) admitted for R colectomy and hernia repair 05/07/17.  Clinical Impression  Pt independently ambulated 400' with RW with no loss of balance. No further PT indicated as pt is modified independent with mobility. Encouraged pt to ambulate in halls TID. PT signing off.      Follow Up Recommendations No PT follow up    Equipment Recommendations  None recommended by PT    Recommendations for Other Services       Precautions / Restrictions Precautions Precautions: Other (comment) Precaution Comments: abdominal incision Restrictions Weight Bearing Restrictions: No      Mobility  Bed Mobility Overal bed mobility: Modified Independent             General bed mobility comments: instructed pt in log roll  Transfers Overall transfer level: Modified independent Equipment used: Rolling walker (2 wheeled)             General transfer comment: VCs hand placement  Ambulation/Gait Ambulation/Gait assistance: Modified independent (Device/Increase time) Ambulation Distance (Feet): 400 Feet Assistive device: Rolling walker (2 wheeled) Gait Pattern/deviations: WFL(Within Functional Limits)   Gait velocity interpretation: at or above normal speed for age/gender General Gait Details: steady with RW, no LOB, no increased pain  Stairs            Wheelchair Mobility    Modified Rankin (Stroke Patients Only)       Balance Overall balance assessment: Modified Independent                                           Pertinent Vitals/Pain Pain Assessment: 0-10 Pain Score: 6  Pain Location: abdomen Pain Descriptors / Indicators: Sore Pain Intervention(s): Limited activity within patient's tolerance;Monitored during session;Premedicated  before session    Home Living Family/patient expects to be discharged to:: Private residence Living Arrangements: Alone Available Help at Discharge: Family;Available PRN/intermittently(3 children live close by and can assist as needed) Type of Home: Apartment Home Access: Level entry     Home Layout: One level Home Equipment: Walker - 2 wheels;Walker - 4 wheels;Bedside commode;Shower seat;Cane - single point      Prior Function Level of Independence: Independent with assistive device(s)         Comments: uses rollator to carry trash, drives, independent ADLs     Hand Dominance   Dominant Hand: Right    Extremity/Trunk Assessment   Upper Extremity Assessment Upper Extremity Assessment: Overall WFL for tasks assessed    Lower Extremity Assessment Lower Extremity Assessment: Overall WFL for tasks assessed    Cervical / Trunk Assessment Cervical / Trunk Assessment: Normal  Communication   Communication: HOH  Cognition Arousal/Alertness: Awake/alert Behavior During Therapy: WFL for tasks assessed/performed Overall Cognitive Status: Within Functional Limits for tasks assessed                                        General Comments      Exercises     Assessment/Plan    PT Assessment Patent does not need any further PT services  PT Problem List         PT  Treatment Interventions      PT Goals (Current goals can be found in the Care Plan section)  Acute Rehab PT Goals Patient Stated Goal: go to grandkids' ball games PT Goal Formulation: All assessment and education complete, DC therapy    Frequency     Barriers to discharge        Co-evaluation               AM-PAC PT "6 Clicks" Daily Activity  Outcome Measure Difficulty turning over in bed (including adjusting bedclothes, sheets and blankets)?: None Difficulty moving from lying on back to sitting on the side of the bed? : None Difficulty sitting down on and standing up from  a chair with arms (e.g., wheelchair, bedside commode, etc,.)?: None Help needed moving to and from a bed to chair (including a wheelchair)?: None Help needed walking in hospital room?: None Help needed climbing 3-5 steps with a railing? : A Little 6 Click Score: 23    End of Session Equipment Utilized During Treatment: Gait belt Activity Tolerance: Patient tolerated treatment well Patient left: in chair;with call bell/phone within reach;with family/visitor present Nurse Communication: Mobility status      Time: 2992-4268 PT Time Calculation (min) (ACUTE ONLY): 23 min   Charges:   PT Evaluation $PT Eval Low Complexity: 1 Low PT Treatments $Gait Training: 8-22 mins   PT G Codes:          Philomena Doheny 05/08/2017, 11:42 AM [phone]

## 2017-05-08 NOTE — Progress Notes (Signed)
OT Cancellation Note  Patient Details Name: [name] MRN: [id number] DOB: July 28, 1934   Cancelled Treatment:    Reason Eval/Treat Not Completed: OT screened, no needs identified, will sign off  [place], Thereasa Parkin 05/08/2017, 11:58 AM

## 2017-05-09 LAB — CBC
HCT: 30.3 % — ABNORMAL LOW (ref 36.0–46.0)
Hemoglobin: 9.9 g/dL — ABNORMAL LOW (ref 12.0–15.0)
MCH: 26.8 pg (ref 26.0–34.0)
MCHC: 32.7 g/dL (ref 30.0–36.0)
MCV: 82.1 fL (ref 78.0–100.0)
PLATELETS: 187 10*3/uL (ref 150–400)
RBC: 3.69 MIL/uL — AB (ref 3.87–5.11)
RDW: 15.3 % (ref 11.5–15.5)
WBC: 10.7 10*3/uL — AB (ref 4.0–10.5)

## 2017-05-09 LAB — BASIC METABOLIC PANEL
Anion gap: 6 (ref 5–15)
BUN: 13 mg/dL (ref 6–20)
CO2: 23 mmol/L (ref 22–32)
Calcium: 8.5 mg/dL — ABNORMAL LOW (ref 8.9–10.3)
Chloride: 104 mmol/L (ref 101–111)
Creatinine, Ser: 1.18 mg/dL — ABNORMAL HIGH (ref 0.44–1.00)
GFR calc Af Amer: 49 mL/min — ABNORMAL LOW (ref >60)
GFR calc non Af Amer: 42 mL/min — ABNORMAL LOW (ref >60)
Glucose, Bld: 125 mg/dL — ABNORMAL HIGH (ref 65–99)
Potassium: 4.4 mmol/L (ref 3.5–5.1)
Sodium: 133 mmol/L — ABNORMAL LOW (ref 135–145)

## 2017-05-09 MED ORDER — ENOXAPARIN SODIUM 40 MG/0.4ML ~~LOC~~ SOLN
40.0000 mg | SUBCUTANEOUS | Status: DC
  Administered 2017-05-10 – 2017-05-13 (×4): 40 mg via SUBCUTANEOUS
  Filled 2017-05-09 (×4): qty 0.4

## 2017-05-09 NOTE — Progress Notes (Signed)
Patient's BP was 174/72, Dr. Lucia Gaskins was paged. He didn't give any further orders.

## 2017-05-09 NOTE — Progress Notes (Signed)
2 Days Post-Op lap converted to open R colectomy and parastomal hernia repair. Subjective: Feels ok.  No flatus.  Had quite a bit of nausea yesterday and small volume emesis this am.  Has ambulated in the halls  Objective: Vital signs in last 24 hours: Temp:  [97.6 F (36.4 C)-99 F (37.2 C)] 98.1 F (36.7 C) (02/08 0504) Pulse Rate:  [70-86] 86 (02/08 0504) Resp:  [16-18] 18 (02/08 0504) BP: (138-187)/(64-80) 174/72 (02/08 0541) SpO2:  [94 %-98 %] 96 % (02/08 0504)   Intake/Output from previous day: 02/07 0701 - 02/08 0700 In: 1845 [I.V.:1845] Out: 1120 [Urine:1000; Drains:120] Intake/Output this shift: No intake/output data recorded.   General appearance: alert and cooperative GI: soft, nondistended JP: SS fluid Incision: no significant drainage  Lab Results:  Recent Labs    05/08/17 0545 05/09/17 0545  WBC 12.8* 10.7*  HGB 10.4* 9.9*  HCT 31.8* 30.3*  PLT 195 187   BMET Recent Labs    05/08/17 0545 05/09/17 0545  NA 133* 133*  K 4.3 4.4  CL 103 104  CO2 22 23  GLUCOSE 153* 125*  BUN 20 13  CREATININE 1.52* 1.18*  CALCIUM 8.5* 8.5*   PT/INR No results for input(s): LABPROT, INR in the last 72 hours. ABG No results for input(s): PHART, HCO3 in the last 72 hours.  Invalid input(s): PCO2, PO2  MEDS, Scheduled . alvimopan  12 mg Oral BID  . amLODipine  5 mg Oral Daily  . citalopram  40 mg Oral Daily  . [START ON 05/10/2017] enoxaparin (LOVENOX) injection  40 mg Subcutaneous Q24H  . [START ON 05/10/2017] furosemide  20 mg Oral Daily  . hydrALAZINE  50 mg Oral TID  . isosorbide mononitrate  30 mg Oral q morning - 10a  . levothyroxine  50 mcg Oral QAC breakfast  . loratadine  10 mg Oral QHS  . losartan  50 mg Oral q morning - 10a  . metoprolol succinate  25 mg Oral Daily  . montelukast  10 mg Oral QHS  . pantoprazole  80 mg Oral Q1200  . rosuvastatin  10 mg Oral q1800  . saccharomyces boulardii  250 mg Oral BID  . traZODone  100 mg Oral QHS     Studies/Results: No results found.  Assessment: s/p Procedure(s): LAPAROSCOPIC  CONVERTED TO OPEN RIGHT COLECTOMY, PARASTOMAL HERNIA  REPAIR Patient Active Problem List   Diagnosis Date Noted  . Irritable bowel syndrome with constipation 10/15/2016  . Iron deficiency 08/13/2016  . Symptomatic cholelithiasis 07/18/2016  . Discitis of lumbar region L5- S1 03/01/2016  . Epidural abscess L5- S1 03/01/2016  . Chronic diastolic CHF (congestive heart failure) ([place]) 03/01/2016  . DDD (degenerative disc disease), lumbar 02/12/2016  . History of GI bleed 12/18/2015  . Gastric volvulus 08/29/2015  . Erosive esophagitis 08/29/2015  . Atrial fibrillation with RVR ([place]) 08/09/2015  . Irreducible hiatal hernia   . TIA (transient ischemic attack)   . Gastric outlet obstruction   . Pressure ulcer 06/17/2015  . Essential hypertension   . Colonic mass 05/02/2015  . Anemia due to chronic blood loss 05/02/2015  . PAF (paroxysmal atrial fibrillation) ([place]) 05/02/2015  . Anxiety and depression 05/02/2015  . CKD (chronic kidney disease) stage 3, GFR 30-59 ml/min (HCC) 03/03/2015  . History of percutaneous coronary intervention   . Coronary artery disease   . HYPERCHOLESTEROLEMIA 07/16/2007    Expected post op course  Plan: Cont NPO until bowel function  Cont to ambulate Cont IVF's  If nausea and emesis becomes a bigger issues, will have IR attempt NG under fluoro    LOS: 2 days     .Rosario Adie, [facility], [place] [phone]   05/09/2017 9:27 AM

## 2017-05-10 LAB — CBC
HCT: 28.1 % — ABNORMAL LOW (ref 36.0–46.0)
Hemoglobin: 9.4 g/dL — ABNORMAL LOW (ref 12.0–15.0)
MCH: 27.3 pg (ref 26.0–34.0)
MCHC: 33.5 g/dL (ref 30.0–36.0)
MCV: 81.7 fL (ref 78.0–100.0)
PLATELETS: 197 10*3/uL (ref 150–400)
RBC: 3.44 MIL/uL — ABNORMAL LOW (ref 3.87–5.11)
RDW: 15.1 % (ref 11.5–15.5)
WBC: 9.6 10*3/uL (ref 4.0–10.5)

## 2017-05-10 LAB — BASIC METABOLIC PANEL
Anion gap: 4 — ABNORMAL LOW (ref 5–15)
BUN: 9 mg/dL (ref 6–20)
CALCIUM: 8.6 mg/dL — AB (ref 8.9–10.3)
CO2: 26 mmol/L (ref 22–32)
CREATININE: 1.23 mg/dL — AB (ref 0.44–1.00)
Chloride: 104 mmol/L (ref 101–111)
GFR calc Af Amer: 47 mL/min — ABNORMAL LOW (ref >60)
GFR, EST NON AFRICAN AMERICAN: 40 mL/min — AB (ref >60)
GLUCOSE: 121 mg/dL — AB (ref 65–99)
Potassium: 4.7 mmol/L (ref 3.5–5.1)
Sodium: 134 mmol/L — ABNORMAL LOW (ref 135–145)

## 2017-05-10 NOTE — Progress Notes (Signed)
3 Days Post-Op lap converted to open R colectomy and parastomal hernia repair. Subjective: Feels ok.  No flatus.  No emesis but persistent nausea.  Liquid stool in bag. Has ambulated in the halls  Objective: Vital signs in last 24 hours: Temp:  [98.3 F (36.8 C)-99.3 F (37.4 C)] 99.3 F (37.4 C) (02/09 0535) Pulse Rate:  [80-86] 86 (02/09 0535) Resp:  [16-18] 18 (02/09 0535) BP: (162-182)/(67-92) 162/70 (02/09 1000) SpO2:  [94 %-96 %] 94 % (02/09 0535)   Intake/Output from previous day: 02/08 0701 - 02/09 0700 In: 1960 [P.O.:160; I.V.:1800] Out: 2495 [Urine:2400; Drains:95] Intake/Output this shift: Total I/O In: 0  Out: 305 [Urine:300; Drains:5]   General appearance: alert and cooperative GI: soft, nondistended, appropriately tender. LLQ stoma pink and productive, liquid stool but no gas JP: SS fluid Incision: no significant drainage  Lab Results:  Recent Labs    05/09/17 0545 05/10/17 0520  WBC 10.7* 9.6  HGB 9.9* 9.4*  HCT 30.3* 28.1*  PLT 187 197   BMET Recent Labs    05/09/17 0545 05/10/17 0520  NA 133* 134*  K 4.4 4.7  CL 104 104  CO2 23 26  GLUCOSE 125* 121*  BUN 13 9  CREATININE 1.18* 1.23*  CALCIUM 8.5* 8.6*   PT/INR No results for input(s): LABPROT, INR in the last 72 hours. ABG No results for input(s): PHART, HCO3 in the last 72 hours.  Invalid input(s): PCO2, PO2  MEDS, Scheduled . alvimopan  12 mg Oral BID  . amLODipine  5 mg Oral Daily  . citalopram  40 mg Oral Daily  . enoxaparin (LOVENOX) injection  40 mg Subcutaneous Q24H  . furosemide  20 mg Oral Daily  . hydrALAZINE  50 mg Oral TID  . isosorbide mononitrate  30 mg Oral q morning - 10a  . levothyroxine  50 mcg Oral QAC breakfast  . loratadine  10 mg Oral QHS  . losartan  50 mg Oral q morning - 10a  . metoprolol succinate  25 mg Oral Daily  . montelukast  10 mg Oral QHS  . pantoprazole  80 mg Oral Q1200  . rosuvastatin  10 mg Oral q1800  . saccharomyces boulardii  250 mg  Oral BID  . traZODone  100 mg Oral QHS    Studies/Results: No results found.  Assessment: s/p Procedure(s): LAPAROSCOPIC  CONVERTED TO OPEN RIGHT COLECTOMY, PARASTOMAL HERNIA  REPAIR Patient Active Problem List   Diagnosis Date Noted  . Irritable bowel syndrome with constipation 10/15/2016  . Iron deficiency 08/13/2016  . Symptomatic cholelithiasis 07/18/2016  . Discitis of lumbar region L5- S1 03/01/2016  . Epidural abscess L5- S1 03/01/2016  . Chronic diastolic CHF (congestive heart failure) ([place]) 03/01/2016  . DDD (degenerative disc disease), lumbar 02/12/2016  . History of GI bleed 12/18/2015  . Gastric volvulus 08/29/2015  . Erosive esophagitis 08/29/2015  . Atrial fibrillation with RVR ([place]) 08/09/2015  . Irreducible hiatal hernia   . TIA (transient ischemic attack)   . Gastric outlet obstruction   . Pressure ulcer 06/17/2015  . Essential hypertension   . Colonic mass 05/02/2015  . Anemia due to chronic blood loss 05/02/2015  . PAF (paroxysmal atrial fibrillation) ([place]) 05/02/2015  . Anxiety and depression 05/02/2015  . CKD (chronic kidney disease) stage 3, GFR 30-59 ml/min (HCC) 03/03/2015  . History of percutaneous coronary intervention   . Coronary artery disease   . HYPERCHOLESTEROLEMIA 07/16/2007    Expected post op course  Plan: Started to have  some ileostomy output this AM but still nauseated, keep NPO Cont to ambulate Cont IVF's If nausea and emesis becomes a bigger issues, will have IR attempt NG under fluoro    LOS: 3 days     Clovis Riley MD [facility], [place]   05/10/2017 10:28 AM

## 2017-05-11 NOTE — Progress Notes (Signed)
4 Days Post-Op lap converted to open R colectomy and parastomal hernia repair. Subjective: Feels ok.  Started having some ostomy output.  Nausea better would like to try some sips.  Liquid stool in bag. Has ambulated in the halls  Objective: Vital signs in last 24 hours: Temp:  [98.6 F (37 C)-98.7 F (37.1 C)] 98.6 F (37 C) (02/10 0513) Pulse Rate:  [69-95] 69 (02/10 1003) Resp:  [15-16] 15 (02/10 0513) BP: (140-178)/(67-92) 172/67 (02/10 1003) SpO2:  [97 %] 97 % (02/10 0513)   Intake/Output from previous day: 02/09 0701 - 02/10 0700 In: 1485 [P.O.:60; I.V.:1425] Out: 3395 [Urine:3350; Drains:45] Intake/Output this shift: Total I/O In: -  Out: 400 [Urine:400]   General appearance: alert and cooperative GI: soft, nondistended, appropriately tender. LLQ stoma pink and productive, liquid stool but no gas JP: SS fluid Incision: no significant drainage  Lab Results:  Recent Labs    05/09/17 0545 05/10/17 0520  WBC 10.7* 9.6  HGB 9.9* 9.4*  HCT 30.3* 28.1*  PLT 187 197   BMET Recent Labs    05/09/17 0545 05/10/17 0520  NA 133* 134*  K 4.4 4.7  CL 104 104  CO2 23 26  GLUCOSE 125* 121*  BUN 13 9  CREATININE 1.18* 1.23*  CALCIUM 8.5* 8.6*   PT/INR No results for input(s): LABPROT, INR in the last 72 hours. ABG No results for input(s): PHART, HCO3 in the last 72 hours.  Invalid input(s): PCO2, PO2  MEDS, Scheduled . alvimopan  12 mg Oral BID  . amLODipine  5 mg Oral Daily  . citalopram  40 mg Oral Daily  . enoxaparin (LOVENOX) injection  40 mg Subcutaneous Q24H  . furosemide  20 mg Oral Daily  . hydrALAZINE  50 mg Oral TID  . isosorbide mononitrate  30 mg Oral q morning - 10a  . levothyroxine  50 mcg Oral QAC breakfast  . loratadine  10 mg Oral QHS  . losartan  50 mg Oral q morning - 10a  . metoprolol succinate  25 mg Oral Daily  . montelukast  10 mg Oral QHS  . pantoprazole  80 mg Oral Q1200  . rosuvastatin  10 mg Oral q1800  . saccharomyces  boulardii  250 mg Oral BID  . traZODone  100 mg Oral QHS    Studies/Results: No results found.  Assessment: s/p Procedure(s): LAPAROSCOPIC  CONVERTED TO OPEN RIGHT COLECTOMY, PARASTOMAL HERNIA  REPAIR Patient Active Problem List   Diagnosis Date Noted  . Irritable bowel syndrome with constipation 10/15/2016  . Iron deficiency 08/13/2016  . Symptomatic cholelithiasis 07/18/2016  . Discitis of lumbar region L5- S1 03/01/2016  . Epidural abscess L5- S1 03/01/2016  . Chronic diastolic CHF (congestive heart failure) ([place]) 03/01/2016  . DDD (degenerative disc disease), lumbar 02/12/2016  . History of GI bleed 12/18/2015  . Gastric volvulus 08/29/2015  . Erosive esophagitis 08/29/2015  . Atrial fibrillation with RVR ([place]) 08/09/2015  . Irreducible hiatal hernia   . TIA (transient ischemic attack)   . Gastric outlet obstruction   . Pressure ulcer 06/17/2015  . Essential hypertension   . Colonic mass 05/02/2015  . Anemia due to chronic blood loss 05/02/2015  . PAF (paroxysmal atrial fibrillation) ([place]) 05/02/2015  . Anxiety and depression 05/02/2015  . CKD (chronic kidney disease) stage 3, GFR 30-59 ml/min (HCC) 03/03/2015  . History of percutaneous coronary intervention   . Coronary artery disease   . HYPERCHOLESTEROLEMIA 07/16/2007    Expected post op course  Plan: Started to have some ileostomy output this AM but still nauseated, sips of clears Cont to ambulate Cont IVF's If nausea and emesis becomes a bigger issues, will have IR attempt NG under fluoro    LOS: 4 days     [facility], [place]   05/11/2017 10:32 AM

## 2017-05-12 ENCOUNTER — Telehealth: Payer: Self-pay | Admitting: Hematology

## 2017-05-12 DIAGNOSIS — K6389 Other specified diseases of intestine: Secondary | ICD-10-CM

## 2017-05-12 LAB — BASIC METABOLIC PANEL
Anion gap: 10 (ref 5–15)
BUN: 7 mg/dL (ref 6–20)
CHLORIDE: 98 mmol/L — AB (ref 101–111)
CO2: 23 mmol/L (ref 22–32)
CREATININE: 1.27 mg/dL — AB (ref 0.44–1.00)
Calcium: 8.5 mg/dL — ABNORMAL LOW (ref 8.9–10.3)
GFR calc non Af Amer: 39 mL/min — ABNORMAL LOW (ref >60)
GFR, EST AFRICAN AMERICAN: 45 mL/min — AB (ref >60)
Glucose, Bld: 103 mg/dL — ABNORMAL HIGH (ref 65–99)
POTASSIUM: 3.8 mmol/L (ref 3.5–5.1)
Sodium: 131 mmol/L — ABNORMAL LOW (ref 135–145)

## 2017-05-12 LAB — MAGNESIUM: Magnesium: 1.6 mg/dL — ABNORMAL LOW (ref 1.7–2.4)

## 2017-05-12 MED ORDER — MAGNESIUM OXIDE 400 (241.3 MG) MG PO TABS
400.0000 mg | ORAL_TABLET | Freq: Once | ORAL | Status: AC
  Administered 2017-05-12: 400 mg via ORAL
  Filled 2017-05-12: qty 1

## 2017-05-12 MED ORDER — ENSURE ENLIVE PO LIQD: 237.0000 mL | Freq: Two times a day (BID) | ORAL | Status: DC

## 2017-05-12 NOTE — Telephone Encounter (Signed)
Pt has been scheduled to see Dr. Burr Medico on 2/20 at 230pm.

## 2017-05-12 NOTE — Progress Notes (Signed)
5 Days Post-Op lap converted to open R colectomy and parastomal hernia repair. Subjective: Feels better.  Pain controlled.  Having bowel function.  Ambulating in the halls  Objective: Vital signs in last 24 hours: Temp:  [98.1 F (36.7 C)-100.2 F (37.9 C)] 99.3 F (37.4 C) (02/11 0500) Pulse Rate:  [64-76] 73 (02/11 0500) Resp:  [16-18] 18 (02/11 0500) BP: (133-172)/(57-75) 171/72 (02/11 0500) SpO2:  [94 %-98 %] 94 % (02/11 0500)   Intake/Output from previous day: 02/10 0701 - 02/11 0700 In: 2240 [P.O.:440; I.V.:1800] Out: 2790 [Urine:2650; Drains:80; Stool:60] Intake/Output this shift: No intake/output data recorded.   General appearance: alert and cooperative GI: soft, nondistended JP: SS fluid Incision: no significant drainage  Lab Results:  Recent Labs    05/10/17 0520  WBC 9.6  HGB 9.4*  HCT 28.1*  PLT 197   BMET Recent Labs    05/10/17 0520 05/12/17 0419  NA 134* 131*  K 4.7 3.8  CL 104 98*  CO2 26 23  GLUCOSE 121* 103*  BUN 9 7  CREATININE 1.23* 1.27*  CALCIUM 8.6* 8.5*   PT/INR No results for input(s): LABPROT, INR in the last 72 hours. ABG No results for input(s): PHART, HCO3 in the last 72 hours.  Invalid input(s): PCO2, PO2  MEDS, Scheduled . amLODipine  5 mg Oral Daily  . citalopram  40 mg Oral Daily  . enoxaparin (LOVENOX) injection  40 mg Subcutaneous Q24H  . feeding supplement (ENSURE ENLIVE)  237 mL Oral BID BM  . furosemide  20 mg Oral Daily  . hydrALAZINE  50 mg Oral TID  . isosorbide mononitrate  30 mg Oral q morning - 10a  . levothyroxine  50 mcg Oral QAC breakfast  . loratadine  10 mg Oral QHS  . losartan  50 mg Oral q morning - 10a  . magnesium oxide  400 mg Oral Once  . metoprolol succinate  25 mg Oral Daily  . montelukast  10 mg Oral QHS  . pantoprazole  80 mg Oral Q1200  . rosuvastatin  10 mg Oral q1800  . saccharomyces boulardii  250 mg Oral BID  . traZODone  100 mg Oral QHS    Studies/Results: No results  found.  Assessment: s/p Procedure(s): LAPAROSCOPIC  CONVERTED TO OPEN RIGHT COLECTOMY, PARASTOMAL HERNIA  REPAIR Patient Active Problem List   Diagnosis Date Noted  . Irritable bowel syndrome with constipation 10/15/2016  . Iron deficiency 08/13/2016  . Symptomatic cholelithiasis 07/18/2016  . Discitis of lumbar region L5- S1 03/01/2016  . Epidural abscess L5- S1 03/01/2016  . Chronic diastolic CHF (congestive heart failure) ([place]) 03/01/2016  . DDD (degenerative disc disease), lumbar 02/12/2016  . History of GI bleed 12/18/2015  . Gastric volvulus 08/29/2015  . Erosive esophagitis 08/29/2015  . Atrial fibrillation with RVR ([place]) 08/09/2015  . Irreducible hiatal hernia   . TIA (transient ischemic attack)   . Gastric outlet obstruction   . Pressure ulcer 06/17/2015  . Essential hypertension   . Colonic mass 05/02/2015  . Anemia due to chronic blood loss 05/02/2015  . PAF (paroxysmal atrial fibrillation) ([place]) 05/02/2015  . Anxiety and depression 05/02/2015  . CKD (chronic kidney disease) stage 3, GFR 30-59 ml/min (HCC) 03/03/2015  . History of percutaneous coronary intervention   . Coronary artery disease   . HYPERCHOLESTEROLEMIA 07/16/2007    Expected post op course  Plan: Advance to fulls today Cont to ambulate SL IVF's Possible d/c tom Mg PO Reviewed path with pt.  Have sent referral notice to oncology   LOS: 5 days     .Rosario Adie, [facility], [place]   05/12/2017 8:56 AM

## 2017-05-12 NOTE — Progress Notes (Signed)
   05/12/17 1200  Clinical Encounter Type  Visited With Patient  Visit Type Initial;Psychological support;Spiritual support  Referral From Nurse  Consult/Referral To Chaplain  Spiritual Encounters  Spiritual Needs Emotional;Other (Comment) (Spiritual Care Conversation/Support)  Stress Factors  Patient Stress Factors None identified   I visited with the patient per referral from the Nursing Director of the ICU.  The patient had no pressing needs at this time; but I visited with her briefly.   Please, contact Spiritual Care for further assistance.   Chaplain Shanon Ace M.Div., [facility]

## 2017-05-13 NOTE — Discharge Instructions (Signed)
ABDOMINAL SURGERY: POST OP INSTRUCTIONS  1. DIET: Follow a light bland diet the first 24 hours after arrival home, such as soup, liquids, crackers, etc.  Be sure to include lots of fluids daily.  Avoid fast food or heavy meals as your are more likely to get nauseated.  Do not eat any uncooked fruits or vegetables for the next 2 weeks as your colon heals. 2. Take your usually prescribed home medications unless otherwise directed. 3. PAIN CONTROL: a. Pain is best controlled by a usual combination of three different methods TOGETHER: i. Ice/Heat ii. Over the counter pain medication iii. Prescription pain medication b. Most patients will experience some swelling and bruising around the incisions.  Ice packs or heating pads (30-60 minutes up to 6 times a day) will help. Use ice for the first few days to help decrease swelling and bruising, then switch to heat to help relax tight/sore spots and speed recovery.  Some people prefer to use ice alone, heat alone, alternating between ice & heat.  Experiment to what works for you.  Swelling and bruising can take several weeks to resolve.   c. It is helpful to take an over-the-counter pain medication regularly for the first few weeks.  Choose one of the following that works best for you: i. Naproxen (Aleve, etc)  Two 220mg  tabs twice a day ii. Ibuprofen (Advil, etc) Three 200mg  tabs four times a day (every meal & bedtime) iii. Acetaminophen (Tylenol, etc) 500-650mg  four times a day (every meal & bedtime)   4. Avoid getting constipated.   Increasing fluid intake and taking a fiber supplement (such as Metamucil, Citrucel, FiberCon, etc) 1-2 times a day regularly will usually help prevent this problem from occurring.  A mild laxative (prune juice, MiraLax, etc) should be taken according to package directions if there are no bowel movements after 48 hours.   5. Watch out for diarrhea.  If you have many loose bowel movements, simplify your diet to bland foods &  liquids for a few days.  Stop any stool softeners and decrease your fiber supplement.  Switching to mild anti-diarrheal medications (Kayopectate, Pepto Bismol) can help.  If this worsens or does not improve, please call us. 6. Wash / shower every day.  You may shower over the incision / wound.  Avoid baths until the skin is fully healed.  Continue to shower over incision(s) after the dressing is off. 7. You may leave the incision open to air.  You may replace a dressing/Band-Aid to cover the incision for comfort if you wish. 8. ACTIVITIES as tolerated:   a. You may resume regular (light) daily activities beginning the next day--such as daily self-care, walking, climbing stairs--gradually increasing activities as tolerated.  If you can walk 30 minutes without difficulty, it is safe to try more intense activity such as jogging, treadmill, bicycling, low-impact aerobics, swimming, etc. b. Save the most intensive and strenuous activity for last such as sit-ups, heavy lifting, contact sports, etc  Refrain from any heavy lifting or straining until you are off narcotics for pain control.   c. DO NOT PUSH THROUGH PAIN.  Let pain be your guide: If it hurts to do something, don't do it.  Pain is your body warning you to avoid that activity for another week until the pain goes down. d. You may drive when you are no longer taking prescription pain medication, you can comfortably wear a seatbelt, and you can safely maneuver your car and apply brakes. e. Dennis Bast may  have sexual intercourse when it is comfortable.  9. FOLLOW UP in our office a. Please call [facility] at [phone] to set up an appointment to see your surgeon in the office for a follow-up appointment approximately 1-2 weeks after your surgery. b. Make sure that you call for this appointment the day you arrive home to insure a convenient appointment time. 10. IF YOU HAVE DISABILITY OR FAMILY LEAVE FORMS, BRING THEM TO THE OFFICE FOR PROCESSING.  DO NOT GIVE  THEM TO YOUR DOCTOR.   WHEN TO CALL us [phone]: 1. Poor pain control 2. Reactions / problems with new medications (rash/itching, nausea, etc)  3. Fever over 101.5 F (38.5 C) 4. Inability to urinate 5. Nausea and/or vomiting 6. Worsening swelling or bruising 7. Continued bleeding from incision. 8. Increased pain, redness, or drainage from the incision  The clinic staff is available to answer your questions during regular business hours (8:30am-5pm).  Please dont hesitate to call and ask to speak to one of our nurses for clinical concerns.   A surgeon from [facility] is always on call at the hospitals   If you have a medical emergency, go to the nearest emergency room or call 911.    [facility], [address], [place], [place], [place]  [address] ? MAIN: [phone] ? TOLL FREE: [phone] ? FAX [phone] [email or web address]

## 2017-05-13 NOTE — Discharge Summary (Signed)
Physician Discharge Summary  Patient ID: [name] MRN: [id number] DOB/AGE: 06/04/1934 83 y.o.  Admit date: 05/07/2017 Discharge date: 05/13/2017  Admission Diagnoses: Colon mass  Discharge Diagnoses:  Colon cancer stage 3  Discharged Condition: good  Hospital Course: Pt admitted after surgery.  Diet was advanced after ileus resolved.  Pt was felt to be in stable condition for discharge by POD 6.   Consults: None  Significant Diagnostic Studies: labs: cbc, bmet  Treatments: IV hydration, analgesia: acetaminophen and surgery: open R colectomy  Discharge Exam: Blood pressure 137/81, pulse 70, temperature 98.3 F (36.8 C), temperature source Oral, resp. rate 18, height 5' 2.5" (1.588 m), weight 65.3 kg (144 lb), SpO2 98 %. General appearance: alert and cooperative GI: normal findings: soft, non-tender Incision/Wound: clean, dry, intact  Disposition: 01-Home or Self Care   Allergies as of 05/13/2017      Reactions   Sulfamethoxazole Rash      Medication List    TAKE these medications   albuterol 108 (90 Base) MCG/ACT inhaler Commonly known as:  PROVENTIL HFA;VENTOLIN HFA Inhale 2 puffs into the lungs every 6 (six) hours as needed for wheezing or shortness of breath.   ALPRAZolam 0.25 MG tablet Commonly known as:  XANAX TAKE 1/2 TO 1 TABLET 3 TIMES DAILY AS NEEDED What changed:  See the new instructions.   amLODipine 5 MG tablet Commonly known as:  NORVASC TAKE ONE (1) TABLET EACH DAY What changed:  See the new instructions.   citalopram 40 MG tablet Commonly known as:  CELEXA TAKE ONE (1) TABLET EACH DAY What changed:  See the new instructions.   esomeprazole 40 MG capsule Commonly known as:  [place] (1) CAPSULE EACH DAY What changed:  See the new instructions.   ferrous sulfate 325 (65 FE) MG EC tablet Take 325 mg by mouth daily with breakfast.   fluticasone 50 MCG/ACT nasal spray Commonly known as:  FLONASE USE 2 SPRAYS IN EACH NOSTRIL  DAILY What changed:    how much to take  how to take this  when to take this   furosemide 20 MG tablet Commonly known as:  LASIX TAKE ONE (1) TABLET EACH DAY What changed:  See the new instructions.   hydrALAZINE 50 MG tablet Commonly known as:  APRESOLINE TAKE ONE (1) TABLET THREE (3) TIMES EACH DAY   ibuprofen 200 MG tablet Commonly known as:  ADVIL,MOTRIN Take 200-400 mg by mouth daily as needed for headache or moderate pain.   isosorbide mononitrate 30 MG 24 hr tablet Commonly known as:  IMDUR TAKE ONE TABLET EVERY MORNING   levothyroxine 50 MCG tablet Commonly known as:  SYNTHROID, LEVOTHROID Take 1 tablet (50 mcg total) by mouth daily. What changed:  when to take this   loratadine 10 MG tablet Commonly known as:  CLARITIN Take 1 tablet (10 mg total) by mouth daily. What changed:  when to take this   losartan 100 MG tablet Commonly known as:  COZAAR Take 0.5 tablets (50 mg total) by mouth daily. What changed:  when to take this   meclizine 25 MG tablet Commonly known as:  ANTIVERT Take 1 tablet (25 mg total) by mouth 3 (three) times daily as needed for dizziness.   metoprolol succinate 25 MG 24 hr tablet Commonly known as:  TOPROL-XL TAKE ONE TABLET BY MOUTH TWICE DAILY What changed:    how much to take  how to take this  when to take this   montelukast 10  MG tablet Commonly known as:  SINGULAIR Take 1 tablet (10 mg total) by mouth at bedtime.   nitroGLYCERIN 0.4 MG SL tablet Commonly known as:  NITROSTAT Place 1 tablet (0.4 mg total) under the tongue every 5 (five) minutes as needed for chest pain. Reported on 04/26/2015   ondansetron 4 MG tablet Commonly known as:  ZOFRAN Take 1 tablet (4 mg total) by mouth every 8 (eight) hours as needed for nausea or vomiting.   polyethylene glycol powder powder Commonly known as:  GLYCOLAX/MIRALAX MIX 17 GRAMS IN 80Z OF WATER/JUICE AND DRINK TWICE DAILY What changed:    when to take this  additional  instructions   potassium chloride 10 MEQ tablet Commonly known as:  K-DUR TAKE ONE (1) TABLET EACH DAY What changed:  See the new instructions.   rosuvastatin 10 MG tablet Commonly known as:  CRESTOR TAKE ONE (1) TABLET EACH DAY What changed:  See the new instructions.   SYSTANE BALANCE OP Apply 1 drop to eye daily as needed (dry eyes).   traZODone 100 MG tablet Commonly known as:  DESYREL TAKE ONE TABLET DAILY AT BEDTIME   Vitamin D (Ergocalciferol) 50000 units Caps capsule Commonly known as:  DRISDOL TAKE 1 CAPSULE EVERY WEEK ON TUESDAYS What changed:    how much to take  how to take this  when to take this  additional instructions   zolpidem 10 MG tablet Commonly known as:  AMBIEN TAKE ONE (1) TABLET AT BEDTIME      Follow-up Information    Leighton Ruff, MD. Go in 2 week(s).   Specialty:  General Surgery Why:  as scheduled Contact information: [address] [place] [address] [phone]        Truitt Merle, MD. Go on 05/21/2017.   Specialties:  Hematology, Oncology Why:  at 2:30pm Contact information: [place] [address] [phone]           Signed: Rosario Adie 6/41/5830, 9:40 AM

## 2017-05-13 NOTE — Progress Notes (Signed)
Discharge instructions discussed with patient and family, verbalized agreement and understanding 

## 2017-05-19 NOTE — Progress Notes (Signed)
[place]  [phone] Fax:([phone]) [place] Note   Patient Care Team: [name] as PCP - General (General Practice) Okey Regal, Holly (Optometry) Herminio Commons, MD as Attending Physician (Cardiology) Truitt Merle, MD as Consulting Physician (Hematology) Leighton Ruff, MD as Consulting Physician (General Surgery) 05/21/2017  REFERRAL PHYSICIAN: [name]   CHIEF COMPLAINTS/PURPOSE OF CONSULTATION:  invasive colorectal adenocarcinoma  Oncology History   Cancer Staging Cancer of right colon [facility]) Staging form: Colon and Rectum, AJCC 8th Edition - Pathologic stage from 05/07/2017: Stage IIIB (pT3, pN1a, cM0) - Signed by Truitt Merle, MD on 05/20/2017       Cancer of right colon ([place])   02/06/2017 Procedure    IMPRESSION: Colon, right, biopsy - Villous adenoma with multifocal high-grade dysplasia (multiple fragments), arising in a background of traditional serrated adenoma, see comment. See Comment Comment -There is a vigorous stromal reaction, possibly reaction to previous biopsy. While no definitive invasion is identified, the specimen appears small in comparison to the clinically described lesion and may not be representative.       03/13/2017 Imaging    CT AP W Contrast 03/13/17 IMPRESSION: 1. Stable moderate to large parastomal hernia containing multiple small bowel loops in the ventral left abdominal wall at the end colostomy site. No evidence of acute bowel complication. 2. Large hiatal hernia. 3. Finely irregular liver surface, suggesting cirrhosis. Consider hepatic elastography for further liver fibrosis risk stratification, as clinically warranted. 4. Stable 1.0 cm cystic pancreatic neck lesion. Follow-up MRI abdomen without with IV contrast recommended in 2 years. This recommendation follows ACR consensus guidelines: Management of Incidental Pancreatic Cysts: A White Paper of the ACR  Incidental Findings Committee. J Am Coll Radiol 4193;79:024-097. 5. Stable 4 mm left UPJ stone.  No hydronephrosis. 6.  Aortic Atherosclerosis (ICD10-I70.0).  Coronary atherosclerosis.      05/07/2017 Surgery    Colectomy and parastomal hernia repair by [name]       05/07/2017 Pathology Results    Diagnosis 05/07/17 Colon, segmental resection for tumor, right colon and terminal ileum - INVASIVE COLORECTAL ADENOCARCINOMA, 3.3 CM. - TUMOR EXTENDS INTO PERICOLONIC CONNECTIVE TISSUE. - MARGINS NOT INVOLVED. - METASTATIC CARCINOMA IN ONE OF TWENTY-SIX LYMPH NODES (1/26).      05/20/2017 Initial Diagnosis    Cancer of right colon (HCC)        HISTORY OF PRESENTING ILLNESS:  [name] 83 y.o. female is a here because of newly diagnosed invasive colorectal adenocarcinoma. The patient was referred by [name]. The patient presents to the clinic today accompanied by her daughters.  Prior to cancer discovery, she reports a polyp was discovered in her colon 2 years ago and it was partially removed. She notes she did not have any symptoms. No abdominal pain, boating, change in bowel habits, or blood in stool.   Pt had a colonoscopy on 02/06/17 that revealed villous adenoma with multifocal high-grade dysplasia (multiple fragments), arising in a background of traditional serrated adenoma. Pt underwent a colectomy and parastomal hernia repair on 05/07/17 with [name]. Pathology results revealed invasive colorectal adenocarcinoma. There was a 3.3 cm tumor that extends into pericolonic connective tissue. 1/26 lymph nodes biopsied contained metastatic carcinoma.   Today the patient notes she is recovering from surgery. She is taking Ibuprofen occasionally for some pain around her incision.   Socially she is retired and lives in [place] alone.    In the past the patient had a  previous surgical history of hiatal hernia repair, cholecystectomy, partial colectomy with colostomy  creation/hartmann procedure, rotator cuff surgery, cataract surgery, coronary stent intervention and tubal ligation.   She also has been diagnosed with significant comorbidities of HTN, Hypothyroidism, CHF and she has a Hx of melanoma. She sees are cardiologist and nephrologist regularly and she takes Lasix. She states her HTN is controlled. She has no FHx of CA.   She endorses tobacco use and that she quit in 1990.   MEDICAL HISTORY:  Past Medical History:  Diagnosis Date  . Anemia due to blood loss, chronic   . Anxiety state, unspecified   . Atrophy of left kidney   . CKD (chronic kidney disease), stage III ([place])   . Coronary artery disease cardiologist-  dr Bronson Ing   a. Moderate to severe coronary disease involving the left anterior descending artery and right coronary artery.  Sequential stenosis in the LAD is significant.  Right coronary artery is moderate to severely likely nonischemic. Questionable small LVOT obstruction.  No Brockenbrough maneuver was performed.  Catheterization January 2013 w/ PTCA and DES x1 to pLAD b. 05/2014 MV no isch/infarct, EF 60%.  . Depressive disorder, not elsewhere classified   . Diastolic CHF, chronic [facility] [address])    cardiologist-  dr Bronson Ing  . GERD (gastroesophageal reflux disease)   . H/O hiatal hernia   . History of [name]'s esophagus   . History of melanoma excision    back  . Hypertension   . Hypothyroidism   . Macular degeneration    both eyes  . PAF (paroxysmal atrial fibrillation) ([place])   . PONV (postoperative nausea and vomiting)   . Pulmonary hypertension ([place])    PA systolic [id number] mmHg by echocardiogram 06-17-2015  PA pressure 05-08-2011 by cardiac catheterization -- no sig. pulm. htn)PA saturation 62% thermodilution cardiac index 2.0 thick cardiac index 2.4  . Pure hypercholesterolemia   . S/P drug eluting coronary stent placement 05/08/2011   x1 DES to proximal LAD  . Wears dentures    lower  . Wears glasses     SURGICAL  HISTORY: Past Surgical History:  Procedure Laterality Date  . CARDIAC CATHETERIZATION Left 03/05/2015   Procedure: CENTRAL LINE INSERTION;  Surgeon: Aviva Signs, MD;  Location: [facility];  Service: General;  Laterality: Left;  . CATARACT EXTRACTION W/ INTRAOCULAR LENS  IMPLANT, BILATERAL  ~ 2002  . CHOLECYSTECTOMY N/A 07/18/2016   Procedure: LAPAROSCOPIC CHOLECYSTECTOMY WITH INTRAOPERATIVE CHOLANGIOGRAM;  Surgeon: [name], MD;  Location: [facility];  Service: General;  Laterality: N/A;  . COLECTOMY WITH COLOSTOMY CREATION/HARTMANN PROCEDURE N/A 03/05/2015   Procedure: PARTIAL COLECTOMY WITH COLOSTOMY CREATION/HARTMANN PROCEDURE;  Surgeon: Aviva Signs, MD;  Location: [facility];  Service: General;  Laterality: N/A;  . ESOPHAGEAL MANOMETRY N/A 06/06/2014   Procedure: ESOPHAGEAL MANOMETRY (EM);  Surgeon: Jerene Bears, MD;  Location: [facility];  Service: Gastroenterology;  Laterality: N/A;  . ESOPHAGOGASTRODUODENOSCOPY (EGD) WITH PROPOFOL N/A 08/04/2015   Procedure: ESOPHAGOGASTRODUODENOSCOPY (EGD) WITH PROPOFOL;  Surgeon: Mauri Pole, MD;  Location: [facility];  Service: Endoscopy;  Laterality: N/A;  . HERNIA REPAIR    . IR GENERIC HISTORICAL  03/04/2016   IR LUMBAR DISC ASPIRATION W/IMG GUIDE 03/04/2016 Luanne Bras, MD [facility]-INTERV RAD  . LAPAROSCOPIC NISSEN FUNDOPLICATION N/A 4/69/6295   Procedure: LAPAROSCOPIC  LYSIS OF ADHESIONS, SEGMENTAL GASTRECTOMY, PARTIAL REDUCTION OF HERNIA;  Surgeon: [name], MD;  Location: [facility];  Service: General;  Laterality: N/A;  . LAPAROSCOPIC PARTIAL COLECTOMY N/A 05/07/2017   Procedure: LAPAROSCOPIC  CONVERTED TO OPEN RIGHT COLECTOMY, PARASTOMAL HERNIA  REPAIR;  Surgeon: Leighton Ruff, MD;  Location: [facility];  Service: General;  Laterality: N/A;  . NISSEN FUNDOPLICATION  [id number]  . PERCUTANEOUS CORONARY STENT INTERVENTION (PCI-S) N/A 05/08/2011   Procedure: PERCUTANEOUS CORONARY STENT INTERVENTION (PCI-S);  Surgeon: Wellington Hampshire, MD;  [facility]  CATH LAB;  Service: Cardiovascular;  Laterality: N/A;  . ROTATOR CUFF REPAIR  ~ 2001   right  . SHOULDER ARTHROSCOPY  ~ 2004; 2005   left; "joint's wore out"  . TONSILLECTOMY  ~ 1944  . TUBAL LIGATION  1972    SOCIAL HISTORY: Social History   Socioeconomic History  . Marital status: Divorced    Spouse name: Not on file  . Number of children: 3  . Years of education: Not on file  . Highest education level: Not on file  Social Needs  . Financial resource strain: Not on file  . Food insecurity - worry: Not on file  . Food insecurity - inability: Not on file  . Transportation needs - medical: Not on file  . Transportation needs - non-medical: Not on file  Occupational History  . Occupation: Retired  Tobacco Use  . Smoking status: Former Smoker    Packs/day: 1.00    Years: 20.00    Pack years: 20.00    Types: Cigarettes    Start date: 10/12/1958    Last attempt to quit: 04/26/1988    Years since quitting: 29.0  . Smokeless tobacco: Never Used  Substance and Sexual Activity  . Alcohol use: No    Alcohol/week: 0.0 oz  . Drug use: No  . Sexual activity: No  Other Topics Concern  . Not on file  Social History Narrative   Divorced.  Lives alone.  Ambulates with a walker.     FAMILY HISTORY: Family History  Problem Relation Age of Onset  . Heart attack Father   . Hypertension Father   . Hypertension Mother   . Skin cancer Brother        melanoma  . Atrial fibrillation Sister   . Skin cancer Sister   . Colon cancer Neg Hx   . Esophageal cancer Neg Hx   . Rectal cancer Neg Hx   . Stomach cancer Neg Hx     ALLERGIES:  is allergic to sulfamethoxazole.  MEDICATIONS:  Current Outpatient Medications  Medication Sig Dispense Refill  . albuterol (PROVENTIL HFA;VENTOLIN HFA) 108 (90 Base) MCG/ACT inhaler Inhale 2 puffs into the lungs every 6 (six) hours as needed for wheezing or shortness of breath.     . ALPRAZolam (XANAX) 0.25 MG tablet TAKE 1/2 TO 1 TABLET 3 TIMES  DAILY AS NEEDED (Patient taking differently: TAKE 1/2 TO 1 TABLET 3 TIMES DAILY AS NEEDED FOR ANXIETY) 270 tablet 0  . amLODipine (NORVASC) 5 MG tablet TAKE ONE (1) TABLET EACH DAY (Patient taking differently: Take 5 mgs by mouth once daily at night) 90 tablet 1  . citalopram (CELEXA) 40 MG tablet TAKE ONE (1) TABLET EACH DAY (Patient taking differently: TAKE ONE (1) TABLET EACH DAY AT NIGHT) 90 tablet 3  . esomeprazole (NEXIUM) 40 MG capsule TAKE ONE (1) CAPSULE EACH DAY (Patient taking differently: TAKE ONE (1) CAPSULE EACH DAY--- takes in am) 90 capsule 1  . ferrous sulfate 325 (65 FE) MG EC tablet Take 325 mg by mouth daily with breakfast.     . fluticasone (FLONASE) 50 MCG/ACT nasal spray USE 2 SPRAYS IN EACH NOSTRIL DAILY (Patient  taking differently: USE 2 SPRAYS IN EACH NOSTRIL DAILY AS NEEDED FOR CONGESTION) 48 g 1  . furosemide (LASIX) 20 MG tablet TAKE ONE (1) TABLET EACH DAY (Patient taking differently: TAKE ONE (1) TABLET EACH DAY--- takes in am) 90 tablet 1  . hydrALAZINE (APRESOLINE) 50 MG tablet TAKE ONE (1) TABLET THREE (3) TIMES EACH DAY 270 tablet 1  . ibuprofen (ADVIL,MOTRIN) 200 MG tablet Take 200-400 mg by mouth daily as needed for headache or moderate pain.    . isosorbide mononitrate (IMDUR) 30 MG 24 hr tablet TAKE ONE TABLET EVERY MORNING 90 tablet 1  . levothyroxine (SYNTHROID, LEVOTHROID) 50 MCG tablet Take 1 tablet (50 mcg total) by mouth daily. (Patient taking differently: Take 50 mcg by mouth daily before breakfast. ) 90 tablet 0  . loratadine (CLARITIN) 10 MG tablet Take 1 tablet (10 mg total) by mouth daily. (Patient taking differently: Take 10 mg by mouth at bedtime. ) 90 tablet 3  . losartan (COZAAR) 100 MG tablet Take 0.5 tablets (50 mg total) by mouth daily. (Patient taking differently: Take 50 mg by mouth every morning. ) 90 tablet 1  . metoprolol succinate (TOPROL-XL) 25 MG 24 hr tablet TAKE ONE TABLET BY MOUTH TWICE DAILY (Patient taking differently: Take 25 mg by  mouth once daily at night) 180 tablet 1  . montelukast (SINGULAIR) 10 MG tablet Take 1 tablet (10 mg total) by mouth at bedtime. 90 tablet 3  . ondansetron (ZOFRAN) 4 MG tablet Take 1 tablet (4 mg total) by mouth every 8 (eight) hours as needed for nausea or vomiting. 60 tablet 2  . polyethylene glycol powder (GLYCOLAX/MIRALAX) powder MIX 17 GRAMS IN 80Z OF WATER/JUICE AND DRINK TWICE DAILY (Patient taking differently: every other day. MIX 17 GRAMS IN 80Z OF WATER/JUICE AND DRINK ONCE DAILY) 1054 g 2  . potassium chloride (K-DUR) 10 MEQ tablet TAKE ONE (1) TABLET EACH DAY (Patient taking differently: TAKE ONE (1) TABLET EACH DAY--- takes in am) 90 tablet 1  . Propylene Glycol (SYSTANE BALANCE OP) Apply 1 drop to eye daily as needed (dry eyes).    . rosuvastatin (CRESTOR) 10 MG tablet TAKE ONE (1) TABLET EACH DAY (Patient taking differently: Take 10 mgs by mouth daily at night) 90 tablet 1  . traZODone (DESYREL) 100 MG tablet TAKE ONE TABLET DAILY AT BEDTIME 90 tablet 1  . Vitamin D, Ergocalciferol, (DRISDOL) 50000 units CAPS capsule TAKE 1 CAPSULE EVERY WEEK ON TUESDAYS (Patient taking differently: Take 50,000 Units by mouth every 14 (fourteen) days. ) 12 capsule 3  . zolpidem (AMBIEN) 10 MG tablet TAKE ONE (1) TABLET AT BEDTIME 90 tablet 0  . capecitabine (XELODA) 500 MG tablet Take 3 tablets (1,500 mg total) by mouth 2 (two) times daily after a meal. 84 tablet 2  . nitroGLYCERIN (NITROSTAT) 0.4 MG SL tablet Place 1 tablet (0.4 mg total) under the tongue every 5 (five) minutes as needed for chest pain. Reported on 04/26/2015 (Patient not taking: Reported on 05/21/2017) 25 tablet 3   No current facility-administered medications for this visit.     REVIEW OF SYSTEMS:   Constitutional: Denies fevers, chills or abnormal night sweats Eyes: Denies blurriness of vision, double vision or watery eyes Ears, nose, mouth, throat, and face: Denies mucositis or sore throat Respiratory: Denies cough, dyspnea or  wheezes Cardiovascular: Denies palpitation, chest discomfort or lower extremity swelling Gastrointestinal:  Denies nausea, heartburn or change in bowel habits (+) colostomy bag Skin: Denies abnormal skin rashes Lymphatics: Denies  new lymphadenopathy or easy bruising Neurological:Denies numbness, tingling or new weaknesses Behavioral/Psych: Mood is stable, no new changes  All other systems were reviewed with the patient and are negative.  PHYSICAL EXAMINATION: ECOG PERFORMANCE STATUS: 1 - Symptomatic but completely ambulatory  Vitals:   05/21/17 1417  BP: (!) 162/61  Pulse: (!) 57  Resp: 20  Temp: 98 F (36.7 C)  SpO2: 100%   Filed Weights   05/21/17 1417  Weight: 140 lb 8 oz (63.7 kg)    GENERAL:alert, no distress and comfortable SKIN: skin color, texture, turgor are normal, no rashes or significant lesions EYES: normal, conjunctiva are pink and non-injected, sclera clear OROPHARYNX:no exudate, no erythema and lips, buccal mucosa, and tongue normal  NECK: supple, thyroid normal size, non-tender, without nodularity LYMPH:  no palpable lymphadenopathy in the cervical, axillary or inguinal LUNGS: clear to auscultation and percussion with normal breathing effort HEART: regular rate & rhythm, (+) 2/6 systolic murmur  ABDOMEN:abdomen soft, non-tender and normal bowel sounds (+) Midline incision healing well with small open wound at the bottom of the incision. Top of the incision has surrounding skin erythema with no discharge (+) colostomy bag on left  Musculoskeletal:no cyanosis of digits and no clubbing PSYCH: alert & oriented x 3 with fluent speech NEURO: no focal motor/sensory deficits  LABORATORY DATA:  Richardson have reviewed the data as listed CBC Latest Ref Rng & Units 05/21/2017 05/10/2017 05/09/2017  WBC 3.9 - 10.3 K/uL 6.4 9.6 10.7(H)  Hemoglobin 12.0 - 15.0 g/dL - 9.4(L) 9.9(L)  Hematocrit 34.8 - 46.6 % 30.1(L) 28.1(L) 30.3(L)  Platelets 145 - 400 K/uL 298 197 187    CMP  Latest Ref Rng & Units 05/21/2017 05/12/2017 05/10/2017  Glucose 70 - 140 mg/dL 90 103(H) 121(H)  BUN 7 - 26 mg/dL 14 7 9   Creatinine 0.60 - 1.10 mg/dL 1.69(H) 1.27(H) 1.23(H)  Sodium 136 - 145 mmol/L 141 131(L) 134(L)  Potassium 3.5 - 5.1 mmol/L 3.5 3.8 4.7  Chloride 98 - 109 mmol/L 106 98(L) 104  CO2 22 - 29 mmol/L 23 23 26   Calcium 8.4 - 10.4 mg/dL 9.4 8.5(L) 8.6(L)  Total Protein 6.4 - 8.3 g/dL 6.7 - -  Total Bilirubin 0.2 - 1.2 mg/dL <0.2(L) - -  Alkaline Phos 40 - 150 U/L 58 - -  AST 5 - 34 U/L 9 - -  ALT 0 - 55 U/L <6 - -   PROCEDURE  Colon[facility] by Dr. Stephanie A[facility] 02/06/17 IMPRESSION: Colon, right, biopsy - Villous adenoma with multifocal high-grade dysplasia (multiple fragments), arising in a background of traditional serrated adenoma, see comment. See Comment Comment -There is a vigorous stromal reaction, possibly reaction to previous biopsy. While no definitive invasion is identified, the specimen appears small in comparison to the clinically described lesion and may not be representative.  PATHOLOGY Diagnosis 05/07/17 Colon, segmental resection for tumor, right colon and terminal ileum - INVASIVE COLORECTAL ADENOCARCINOMA, 3.3 CM. - TUMOR EXTENDS INTO PERICOLONIC CONNECTIVE TISSUE. - MARGINS NOT INVOLVED. - METASTATIC CARCINOMA IN ONE OF TWENTY-SIX LYMPH NODES (1/26). Microscopic Comment COLON AND RECTUM (INCLUDING TRANS-ANAL RESECTION): Specimen: Right colon and terminal ileum. Procedure: Resection. Tumor site: Distal ascending colon. Specimen integrity: Intact. Macroscopic intactness of mesorectum: Not applicable. Macroscopic tumor perforation: No. Invasive tumor: Maximum size: 3.3 cm. Histologic type(s): Colorectal adenocarcinoma. Histologic grade and differentiation: G2: moderately differentiated/low grade Type of polyp in which invasive carcinoma arose: Tubulovillous adenoma. Microscopic extension of invasive tumor: Into pericolonic connective  tissue. Lymph-Vascular invasion: Present.  Peri-neural invasion: No. Tumor deposit(s) (discontinuous extramural extension): No. Resection margins: Proximal margin: Free of tumor. Distal margin: Free of tumor. Circumferential (radial) (posterior ascending, posterior descending; lateral and posterior mid-rectum; and entire lower 1/3 rectum): Free of tumor. Mesenteric margin (sigmoid and transverse): N/A. Distance closest margin (if all above margins negative): 0.3 cm from circumferential radial margin. Treatment effect (neo-adjuvant therapy): No. 1 of 5 Supplemental copy SUPPLEMENTAL for Senn, [name] ([id number]) Microscopic Comment(continued) Additional polyp(s): Four tubular adenomas. Non-neoplastic findings: Benign appendix. Lymph nodes: number examined 26; number positive: 1. Pathologic Staging: pT3, pN1a, pMX. Ancillary studies: Microsatellite instability by PCR and mismatch repair protein by IHC. (JDP:ah 05/09/17)   RADIOGRAPHIC STUDIES: Richardson have personally reviewed the radiological images as listed and agreed with the findings in the report. No results found.   CT AP W Contrast 03/13/17 IMPRESSION: 1. Stable moderate to large parastomal hernia containing multiple small bowel loops in the ventral left abdominal wall at the end colostomy site. No evidence of acute bowel complication. 2. Large hiatal hernia. 3. Finely irregular liver surface, suggesting cirrhosis. Consider hepatic elastography for further liver fibrosis risk stratification, as clinically warranted. 4. Stable 1.0 cm cystic pancreatic neck lesion. Follow-up MRI abdomen without with IV contrast recommended in 2 years. This recommendation follows ACR consensus guidelines: Management of Incidental Pancreatic Cysts: A White Paper of the ACR Incidental Findings Committee. J Am Coll Radiol 0092;33:007-622. 5. Stable 4 mm left UPJ stone.  No hydronephrosis. 6.  Aortic Atherosclerosis (ICD10-I70.0).  Coronary  atherosclerosis.  ASSESSMENT & PLAN:  [name] 83 y.o. female is a here because of recently diagnosed invasive colorectal adenocarcinoma.   1. Right colon adenocarcinoma Stage IIIB (pT3, pN1A, cM0)  -Richardson reviewed her CT scan findings, and surgical pathology results in great details with patient and her family members -She had a complete surgical resection with [name] on 05/07/17 and pathology results reveal a T3 lesion and 1/26 lymph nodes positive for metastatic carcinoma.  Margins were negative. -She had a CT AP in Dec, 2018 that showed no sign of metastasis. Richardson will order a CT Chest to rule out lung metastases.  -We discussed the risk of cancer recurrence after surgery due to her stage IIIB disease she is at moderate to high risk of recurrence -We discussed the standard adjuvant chemotherapy for stage III colon cancer will reduce the risk of cancer recurrence. -Richardson discussed the regimens for adjuvant chemotherapy, including FOLFOX, CAPOX, or single agent Xeloda or 5-FU. Due to her age and comorbidities, Richardson recommend her to consider Xeloda. She is interested and we will wait till she is fully recovered from surgery. -We discussed the duration of adjuvant chemotherapy.  The standard is 6 months, however if she has tolerance issue, Richardson will shorten the duration.  Hopefully she can tolerate for at least 3-4 months. -Chemotherapy consent: Side effects including but does not not limited to, fatigue, nausea, vomiting, diarrhea, hair loss, neuropathy, fluid retention, renal and kidney dysfunction, neutropenic fever, needed for blood transfusion, bleeding, coronary artery spasm and heart attack, were discussed with patient in great detail. She agrees to proceed. - Due to her history of coronary stent intervention, Richardson recommend that she have a repeat ECHO before starting. She is agreeable and will see her cardiologist Dr. Bronson Ing in early March. Richardson will send message to him. -Pt also has stage III CKD. Richardson will  monitor this closely and may consider reducing her Lasix in the future if she becomes very dehydrated or her  kidney function decreases.  -She is following up with [name] on 05/27/17. She has some erythema at the top and  bottom of her incision. Richardson advised her to use some neosporin over the area -Richardson offered chemo class and she is interested.  She will get labs done today with CEA, CMP, CBC and iron study due to hx of mild anemia  -She has been recovering well from her surgery, Richardson will revaluate her after she sees [name] and probably start her on Xeloda after next visit in 3 weeks  2. HTN, CAD, diastolic CHF, AF, CKD stage III -Continue follow-up with her primary care physician and her cardiologist -We discussed the impact of chemotherapy on her blood pressure.  Richardson may need to hold or reduce her Lasix during her chemo if she has worsening renal function. -Will monitor her blood pressure closely during treatment -Richardson will ask her cardiologist to see her before chemo and repeat her echo.  3. Iron deficient anemia -We will check her iron study and CBC today and monitor in the future -continue oral iron   PLAN: Lab today with CEA, CBC, CMP and iron study F/u in 3 weeks to start chemo Xeloda  CT Chest WO Contrast [facility] Repeat ECHO with Dr. Bronson Ing, Richardson will send message  Plan to start Xeloda after next visit, prescription sent out today   Orders Placed This Encounter  Procedures  . CT Chest Wo Contrast    Standing Status:   Future    Standing Expiration Date:   05/21/2018    Order Specific Question:   Preferred imaging location?    Answer:   [facility]    Order Specific Question:   Radiology Contrast Protocol - do NOT remove file path    Answer:   [email or web address]  . CBC with Differential ([place] Only)    Standing Status:   Standing    Number of Occurrences:   100    Standing Expiration Date:   05/21/2022  . CMP ([place] only)     Standing Status:   Standing    Number of Occurrences:   100    Standing Expiration Date:   05/21/2022  . CEA (IN HOUSE-[facility])    Standing Status:   Standing    Number of Occurrences:   100    Standing Expiration Date:   05/21/2022  . Iron and TIBC    Standing Status:   Standing    Number of Occurrences:   100    Standing Expiration Date:   05/21/2022  . Ferritin    Standing Status:   Standing    Number of Occurrences:   100    Standing Expiration Date:   05/21/2022    All questions were answered. The patient knows to call the clinic with any problems, questions or concerns. Richardson spent 50 minutes counseling the patient face to face. The total time spent in the appointment was 60 minutes and more than 50% was on counseling.  This document serves as a record of services personally performed by Truitt Merle, MD. It was created on her behalf by Theresia Bough, a trained medical scribe. The creation of this record is based on the scribe's personal observations and the provider's statements to them.   Richardson have reviewed the above documentation for accuracy and completeness, and Richardson agree with the above.    Truitt Merle, MD 05/21/2017

## 2017-05-20 DIAGNOSIS — C182 Malignant neoplasm of ascending colon: Secondary | ICD-10-CM | POA: Insufficient documentation

## 2017-05-20 HISTORY — DX: Malignant neoplasm of ascending colon: C18.2

## 2017-05-21 ENCOUNTER — Inpatient Hospital Stay: Payer: Medicare Other | Attending: Hematology | Admitting: Hematology

## 2017-05-21 ENCOUNTER — Inpatient Hospital Stay: Payer: Medicare Other

## 2017-05-21 ENCOUNTER — Telehealth: Payer: Self-pay

## 2017-05-21 ENCOUNTER — Encounter: Payer: Self-pay | Admitting: Hematology

## 2017-05-21 VITALS — BP 162/61 | HR 57 | Temp 98.0°F | Resp 20 | Ht 62.5 in | Wt 140.5 lb

## 2017-05-21 DIAGNOSIS — K862 Cyst of pancreas: Secondary | ICD-10-CM | POA: Diagnosis not present

## 2017-05-21 DIAGNOSIS — K219 Gastro-esophageal reflux disease without esophagitis: Secondary | ICD-10-CM | POA: Insufficient documentation

## 2017-05-21 DIAGNOSIS — I272 Pulmonary hypertension, unspecified: Secondary | ICD-10-CM | POA: Diagnosis not present

## 2017-05-21 DIAGNOSIS — I2510 Atherosclerotic heart disease of native coronary artery without angina pectoris: Secondary | ICD-10-CM | POA: Insufficient documentation

## 2017-05-21 DIAGNOSIS — H3530 Unspecified macular degeneration: Secondary | ICD-10-CM | POA: Insufficient documentation

## 2017-05-21 DIAGNOSIS — C779 Secondary and unspecified malignant neoplasm of lymph node, unspecified: Secondary | ICD-10-CM | POA: Diagnosis not present

## 2017-05-21 DIAGNOSIS — Z9049 Acquired absence of other specified parts of digestive tract: Secondary | ICD-10-CM | POA: Diagnosis not present

## 2017-05-21 DIAGNOSIS — N183 Chronic kidney disease, stage 3 (moderate): Secondary | ICD-10-CM | POA: Insufficient documentation

## 2017-05-21 DIAGNOSIS — C182 Malignant neoplasm of ascending colon: Secondary | ICD-10-CM | POA: Insufficient documentation

## 2017-05-21 DIAGNOSIS — D500 Iron deficiency anemia secondary to blood loss (chronic): Secondary | ICD-10-CM

## 2017-05-21 DIAGNOSIS — K435 Parastomal hernia without obstruction or  gangrene: Secondary | ICD-10-CM | POA: Diagnosis not present

## 2017-05-21 DIAGNOSIS — Z85820 Personal history of malignant melanoma of skin: Secondary | ICD-10-CM | POA: Insufficient documentation

## 2017-05-21 DIAGNOSIS — K449 Diaphragmatic hernia without obstruction or gangrene: Secondary | ICD-10-CM | POA: Diagnosis not present

## 2017-05-21 DIAGNOSIS — Z87891 Personal history of nicotine dependence: Secondary | ICD-10-CM | POA: Diagnosis not present

## 2017-05-21 DIAGNOSIS — D509 Iron deficiency anemia, unspecified: Secondary | ICD-10-CM | POA: Insufficient documentation

## 2017-05-21 DIAGNOSIS — E039 Hypothyroidism, unspecified: Secondary | ICD-10-CM | POA: Diagnosis not present

## 2017-05-21 DIAGNOSIS — N201 Calculus of ureter: Secondary | ICD-10-CM | POA: Diagnosis not present

## 2017-05-21 DIAGNOSIS — E7800 Pure hypercholesterolemia, unspecified: Secondary | ICD-10-CM | POA: Diagnosis not present

## 2017-05-21 DIAGNOSIS — I5032 Chronic diastolic (congestive) heart failure: Secondary | ICD-10-CM | POA: Diagnosis not present

## 2017-05-21 DIAGNOSIS — I130 Hypertensive heart and chronic kidney disease with heart failure and stage 1 through stage 4 chronic kidney disease, or unspecified chronic kidney disease: Secondary | ICD-10-CM | POA: Insufficient documentation

## 2017-05-21 DIAGNOSIS — F329 Major depressive disorder, single episode, unspecified: Secondary | ICD-10-CM | POA: Diagnosis not present

## 2017-05-21 DIAGNOSIS — I480 Paroxysmal atrial fibrillation: Secondary | ICD-10-CM | POA: Diagnosis not present

## 2017-05-21 DIAGNOSIS — I700 Atherosclerosis of aorta: Secondary | ICD-10-CM | POA: Insufficient documentation

## 2017-05-21 DIAGNOSIS — Z955 Presence of coronary angioplasty implant and graft: Secondary | ICD-10-CM | POA: Diagnosis not present

## 2017-05-21 DIAGNOSIS — K2270 Barrett's esophagus without dysplasia: Secondary | ICD-10-CM | POA: Diagnosis not present

## 2017-05-21 DIAGNOSIS — Z79899 Other long term (current) drug therapy: Secondary | ICD-10-CM | POA: Insufficient documentation

## 2017-05-21 LAB — CMP (CANCER CENTER ONLY)
ALBUMIN: 3.2 g/dL — AB (ref 3.5–5.0)
ALT: <6 U/L (ref 0–55)
AST: 9 U/L (ref 5–34)
Alkaline Phosphatase: 58 U/L (ref 40–150)
Anion gap: 12 — ABNORMAL HIGH (ref 3–11)
BUN: 14 mg/dL (ref 7–26)
CHLORIDE: 106 mmol/L (ref 98–109)
CO2: 23 mmol/L (ref 22–29)
Calcium: 9.4 mg/dL (ref 8.4–10.4)
Creatinine: 1.69 mg/dL — ABNORMAL HIGH (ref 0.60–1.10)
GFR, Est AFR Am: 32 mL/min — ABNORMAL LOW (ref >60)
GFR, Estimated: 27 mL/min — ABNORMAL LOW (ref >60)
GLUCOSE: 90 mg/dL (ref 70–140)
Potassium: 3.5 mmol/L (ref 3.5–5.1)
Sodium: 141 mmol/L (ref 136–145)
Total Bilirubin: <0.2 mg/dL — ABNORMAL LOW (ref 0.2–1.2)
Total Protein: 6.7 g/dL (ref 6.4–8.3)

## 2017-05-21 LAB — CBC WITH DIFFERENTIAL (CANCER CENTER ONLY)
Basophils Absolute: 0 10*3/uL (ref 0.0–0.1)
Basophils Relative: 0 %
EOS PCT: 3 %
Eosinophils Absolute: 0.2 10*3/uL (ref 0.0–0.5)
HCT: 30.1 % — ABNORMAL LOW (ref 34.8–46.6)
Hemoglobin: 9.6 g/dL — ABNORMAL LOW (ref 11.6–15.9)
LYMPHS ABS: 1.7 10*3/uL (ref 0.9–3.3)
LYMPHS PCT: 27 %
MCH: 26 pg (ref 25.1–34.0)
MCHC: 31.9 g/dL (ref 31.5–36.0)
MCV: 81.6 fL (ref 79.5–101.0)
MONO ABS: 0.4 10*3/uL (ref 0.1–0.9)
Monocytes Relative: 6 %
Neutro Abs: 4 10*3/uL (ref 1.5–6.5)
Neutrophils Relative %: 64 %
Platelet Count: 298 10*3/uL (ref 145–400)
RBC: 3.69 MIL/uL — AB (ref 3.70–5.45)
RDW: 14.8 % — ABNORMAL HIGH (ref 11.2–14.5)
WBC Count: 6.4 10*3/uL (ref 3.9–10.3)

## 2017-05-21 MED ORDER — CAPECITABINE 500 MG PO TABS: 1000.0000 mg/m2 | ORAL_TABLET | Freq: Two times a day (BID) | ORAL | 2 refills | Status: DC

## 2017-05-21 NOTE — Telephone Encounter (Signed)
Printed avs and calender of upcoming appointment, also gave contrast. Per 2/20 los

## 2017-05-21 NOTE — Progress Notes (Signed)
  Oncology Nurse Navigator Documentation  Navigator Location: [facility] (05/21/17 1500) Referral date to RadOnc/MedOnc: 05/12/17 (05/21/17 1500) )Navigator Encounter Type: Initial MedOnc (05/21/17 1500)   Abnormal Finding Date: 02/06/17 (05/21/17 1500) Confirmed Diagnosis Date: 05/07/17 (05/21/17 1500) Surgery Date: 05/07/17 (05/21/17 1500)           Treatment Initiated Date: 05/07/17 (05/21/17 1500) Patient Visit Type: Initial;MedOnc (05/21/17 1500) Treatment Phase: Pre-Tx/Tx Discussion (05/21/17 1500) Barriers/Navigation Needs: No barriers at this time (05/21/17 1500)  Met with patient and daughters after initial med/onc with Dr. Burr Medico. I introduced my role as GI Navigator and provided pt with my contact information. Patient lives alone but has support of daughters, she has transportation and financial resources. No barriers or needs noted during initial assessment. I encouraged patient to call me with questions or concerns. Interventions: None required (05/21/17 1500)            Acuity: Level 1 (05/21/17 1500)         Time Spent with Patient: 15 (05/21/17 1500)

## 2017-05-22 ENCOUNTER — Telehealth: Payer: Self-pay | Admitting: *Deleted

## 2017-05-22 ENCOUNTER — Telehealth: Payer: Self-pay | Admitting: Pharmacist

## 2017-05-22 ENCOUNTER — Telehealth: Payer: Self-pay | Admitting: Cardiovascular Disease

## 2017-05-22 ENCOUNTER — Other Ambulatory Visit: Payer: Self-pay | Admitting: *Deleted

## 2017-05-22 DIAGNOSIS — Z01818 Encounter for other preprocedural examination: Secondary | ICD-10-CM

## 2017-05-22 DIAGNOSIS — C182 Malignant neoplasm of ascending colon: Secondary | ICD-10-CM

## 2017-05-22 LAB — IRON AND TIBC
Iron: 42 ug/dL (ref 41–142)
Saturation Ratios: 18 % — ABNORMAL LOW (ref 21–57)
TIBC: 234 ug/dL — ABNORMAL LOW (ref 236–444)
UIBC: 192 ug/dL

## 2017-05-22 LAB — FERRITIN: Ferritin: 93 ng/mL (ref 9–269)

## 2017-05-22 LAB — CEA (IN HOUSE-CHCC): CEA (CHCC-In House): 1.84 ng/mL (ref 0.00–5.00)

## 2017-05-22 MED ORDER — XELODA 500 MG PO TABS: ORAL_TABLET | ORAL | 2 refills | Status: DC

## 2017-05-22 NOTE — Telephone Encounter (Signed)
TCT patient regarding her CMP from 05/21/17. Per Dr. Burr Medico, informed pt that her kidney function has gotten a bit worse in the last couple of weeks.  Informed her that Dr. Burr Medico wants her to change her Lasix from daily to every other day and to watch for any swelling in her legs.  Pt voiced understanding. Dr. Burr Medico wants her to follow up with her cardiologist. Patients she has an appt next week with her cardiologist. No other questions or concerns

## 2017-05-22 NOTE — Telephone Encounter (Signed)
Pre-cert Verification for the following procedure   Echo scheduled for 05/27/2017

## 2017-05-22 NOTE — Telephone Encounter (Signed)
Oral Oncology Pharmacist Encounter  Received new prescription for Xeloda (capecitabine) for the adjuvant treatment of colon cancer, planned duration 3-6 months.  Labs from 05/21/17 assessed, OK for treatment. SCr=0.69, est CrCl ~40 mL/min, this will continue to be monitored.  Current medication list in Epic reviewed, no significant DDIs with Xeloda identified.  Prescription has been e-scribed to the [facility] for benefits analysis and approval.  Oral Oncology Clinic will continue to follow for insurance authorization, copayment issues, initial counseling and start date.  Johny Drilling, PharmD, BCPS, BCOP 05/22/2017 2:51 PM Oral Oncology Clinic [phone]

## 2017-05-23 NOTE — Telephone Encounter (Signed)
Oral Chemotherapy Pharmacist Encounter   I spoke with patient for overview of: Xeloda.   Counseled patient on administration, dosing, side effects, monitoring, drug-food interactions, safe handling, storage, and disposal.  Patient will take Xeloda 500mg  tablets, 3 tablets (1500mg ) by mouth in AM and 3 tabs (1500mg ) by mouth in PM, within 30 minutes of finishing meals, on days 1-14 of each 21 day cycle.   Xeloda start date: ~06/16/17  Side effects of Xeloda include but not limited to: fatigue, decreased blood counts, GI upset, diarrhea, and hand-foot syndrome. Patient has loperamide at home and will call the office if diarrhea develops.    Reviewed with patient importance of keeping a medication schedule and plan for any missed doses.  [name] voiced understanding and appreciation.   All questions answered. Medication reconciliation performed and medication/allergy list updated.  This information will be reviewed with patient again in chemotherapy education class on 06/12/17. Patient will start Xeloda after fully healed from surgery.  Patient will pick up Xeloda from the [facility] on 06/12/17 for anticipated copayment of $3 as patient has Medicaid prescription insurance coverage.  Patient knows to call the office with questions or concerns. Oral Oncology Clinic will continue to follow.  Thank you,  [name], PharmD, BCPS, BCOP 05/23/2017   9:52 AM Oral Oncology Clinic [phone]

## 2017-05-27 ENCOUNTER — Other Ambulatory Visit: Payer: Medicare Other

## 2017-05-28 ENCOUNTER — Ambulatory Visit (INDEPENDENT_AMBULATORY_CARE_PROVIDER_SITE_OTHER): Payer: Medicare Other

## 2017-05-28 ENCOUNTER — Other Ambulatory Visit: Payer: Self-pay

## 2017-05-28 ENCOUNTER — Other Ambulatory Visit: Payer: Self-pay | Admitting: Physician Assistant

## 2017-05-28 DIAGNOSIS — Z01818 Encounter for other preprocedural examination: Secondary | ICD-10-CM | POA: Diagnosis not present

## 2017-05-28 NOTE — Telephone Encounter (Signed)
Last seen 04/18/17  Glenard Haring

## 2017-05-30 ENCOUNTER — Telehealth: Payer: Self-pay | Admitting: *Deleted

## 2017-05-30 NOTE — Telephone Encounter (Signed)
Notes recorded by Laurine Blazer, LPN on 10/08/4799 at 6:55 PM EST Patient notified. Copy to pmd. Follow up scheduled for 06/16/2017 with Dr. Bronson Ing.   ------  Notes recorded by Herminio Commons, MD on 05/29/2017 at 3:32 PM EST Normal pumping function, stiffness with relaxation.

## 2017-06-05 ENCOUNTER — Ambulatory Visit (HOSPITAL_COMMUNITY)
Admission: RE | Admit: 2017-06-05 | Discharge: 2017-06-05 | Disposition: A | Discharge status: Home / Self Care | Payer: Medicare Other | Source: Ambulatory Visit | Attending: Hematology | Admitting: Hematology

## 2017-06-05 DIAGNOSIS — K449 Diaphragmatic hernia without obstruction or gangrene: Secondary | ICD-10-CM | POA: Insufficient documentation

## 2017-06-05 DIAGNOSIS — I700 Atherosclerosis of aorta: Secondary | ICD-10-CM | POA: Insufficient documentation

## 2017-06-05 DIAGNOSIS — C182 Malignant neoplasm of ascending colon: Secondary | ICD-10-CM | POA: Diagnosis not present

## 2017-06-05 DIAGNOSIS — I2510 Atherosclerotic heart disease of native coronary artery without angina pectoris: Secondary | ICD-10-CM | POA: Diagnosis not present

## 2017-06-05 DIAGNOSIS — J439 Emphysema, unspecified: Secondary | ICD-10-CM | POA: Insufficient documentation

## 2017-06-12 ENCOUNTER — Inpatient Hospital Stay: Payer: Medicare Other

## 2017-06-12 ENCOUNTER — Encounter: Payer: Self-pay | Admitting: Nurse Practitioner

## 2017-06-12 ENCOUNTER — Inpatient Hospital Stay: Payer: Medicare Other | Attending: Hematology | Admitting: Nurse Practitioner

## 2017-06-12 ENCOUNTER — Ambulatory Visit: Payer: Medicare Other

## 2017-06-12 VITALS — BP 153/62 | HR 50 | Temp 97.9°F | Resp 17 | Ht 62.5 in | Wt 144.0 lb

## 2017-06-12 DIAGNOSIS — Z9049 Acquired absence of other specified parts of digestive tract: Secondary | ICD-10-CM | POA: Diagnosis not present

## 2017-06-12 DIAGNOSIS — D509 Iron deficiency anemia, unspecified: Secondary | ICD-10-CM | POA: Diagnosis not present

## 2017-06-12 DIAGNOSIS — F419 Anxiety disorder, unspecified: Secondary | ICD-10-CM | POA: Insufficient documentation

## 2017-06-12 DIAGNOSIS — I480 Paroxysmal atrial fibrillation: Secondary | ICD-10-CM | POA: Diagnosis not present

## 2017-06-12 DIAGNOSIS — Z933 Colostomy status: Secondary | ICD-10-CM | POA: Insufficient documentation

## 2017-06-12 DIAGNOSIS — R109 Unspecified abdominal pain: Secondary | ICD-10-CM | POA: Insufficient documentation

## 2017-06-12 DIAGNOSIS — N183 Chronic kidney disease, stage 3 (moderate): Secondary | ICD-10-CM | POA: Diagnosis not present

## 2017-06-12 DIAGNOSIS — E7800 Pure hypercholesterolemia, unspecified: Secondary | ICD-10-CM | POA: Insufficient documentation

## 2017-06-12 DIAGNOSIS — I2510 Atherosclerotic heart disease of native coronary artery without angina pectoris: Secondary | ICD-10-CM | POA: Diagnosis not present

## 2017-06-12 DIAGNOSIS — I5032 Chronic diastolic (congestive) heart failure: Secondary | ICD-10-CM | POA: Insufficient documentation

## 2017-06-12 DIAGNOSIS — Z79890 Hormone replacement therapy (postmenopausal): Secondary | ICD-10-CM | POA: Diagnosis not present

## 2017-06-12 DIAGNOSIS — F329 Major depressive disorder, single episode, unspecified: Secondary | ICD-10-CM | POA: Diagnosis not present

## 2017-06-12 DIAGNOSIS — D500 Iron deficiency anemia secondary to blood loss (chronic): Secondary | ICD-10-CM

## 2017-06-12 DIAGNOSIS — E039 Hypothyroidism, unspecified: Secondary | ICD-10-CM | POA: Insufficient documentation

## 2017-06-12 DIAGNOSIS — R110 Nausea: Secondary | ICD-10-CM | POA: Insufficient documentation

## 2017-06-12 DIAGNOSIS — K219 Gastro-esophageal reflux disease without esophagitis: Secondary | ICD-10-CM | POA: Diagnosis not present

## 2017-06-12 DIAGNOSIS — C182 Malignant neoplasm of ascending colon: Secondary | ICD-10-CM | POA: Diagnosis present

## 2017-06-12 DIAGNOSIS — I130 Hypertensive heart and chronic kidney disease with heart failure and stage 1 through stage 4 chronic kidney disease, or unspecified chronic kidney disease: Secondary | ICD-10-CM | POA: Diagnosis not present

## 2017-06-12 DIAGNOSIS — R1030 Lower abdominal pain, unspecified: Secondary | ICD-10-CM

## 2017-06-12 LAB — CMP (CANCER CENTER ONLY)
ALK PHOS: 60 U/L (ref 40–150)
ANION GAP: 8 (ref 3–11)
AST: 14 U/L (ref 5–34)
Albumin: 3.4 g/dL — ABNORMAL LOW (ref 3.5–5.0)
BILIRUBIN TOTAL: 0.2 mg/dL (ref 0.2–1.2)
BUN: 18 mg/dL (ref 7–26)
CALCIUM: 9.3 mg/dL (ref 8.4–10.4)
CO2: 27 mmol/L (ref 22–29)
CREATININE: 1.46 mg/dL — AB (ref 0.60–1.10)
Chloride: 107 mmol/L (ref 98–109)
GFR, Est AFR Am: 38 mL/min — ABNORMAL LOW (ref >60)
GFR, Estimated: 33 mL/min — ABNORMAL LOW (ref >60)
Glucose, Bld: 98 mg/dL (ref 70–140)
Potassium: 4 mmol/L (ref 3.5–5.1)
SODIUM: 142 mmol/L (ref 136–145)
TOTAL PROTEIN: 6.4 g/dL (ref 6.4–8.3)

## 2017-06-12 LAB — CBC WITH DIFFERENTIAL (CANCER CENTER ONLY)
BASOS ABS: 0 10*3/uL (ref 0.0–0.1)
BASOS PCT: 0 %
EOS PCT: 6 %
Eosinophils Absolute: 0.3 10*3/uL (ref 0.0–0.5)
HCT: 34 % — ABNORMAL LOW (ref 34.8–46.6)
Hemoglobin: 10.7 g/dL — ABNORMAL LOW (ref 11.6–15.9)
LYMPHS PCT: 33 %
Lymphs Abs: 1.7 10*3/uL (ref 0.9–3.3)
MCH: 26.6 pg (ref 25.1–34.0)
MCHC: 31.5 g/dL (ref 31.5–36.0)
MCV: 84.4 fL (ref 79.5–101.0)
MONO ABS: 0.4 10*3/uL (ref 0.1–0.9)
MONOS PCT: 9 %
NEUTROS ABS: 2.6 10*3/uL (ref 1.5–6.5)
Neutrophils Relative %: 52 %
PLATELETS: 166 10*3/uL (ref 145–400)
RBC: 4.03 MIL/uL (ref 3.70–5.45)
RDW: 16.1 % — AB (ref 11.2–14.5)
WBC Count: 5 10*3/uL (ref 3.9–10.3)

## 2017-06-12 LAB — FERRITIN: Ferritin: 46 ng/mL (ref 9–269)

## 2017-06-12 MED ORDER — ONDANSETRON HCL 8 MG PO TABS: 8.0000 mg | ORAL_TABLET | Freq: Three times a day (TID) | ORAL | 0 refills | Status: DC | PRN

## 2017-06-12 MED ORDER — TRAMADOL HCL 50 MG PO TABS: 50.0000 mg | ORAL_TABLET | Freq: Two times a day (BID) | ORAL | 1 refills | Status: AC | PRN

## 2017-06-12 MED FILL — XELODA 500 MG TABLET: 500 | 21 days supply | Qty: 84 | Fill #0

## 2017-06-12 NOTE — Progress Notes (Addendum)
[place]  [phone] [phone]  Clinic Follow up Note   Patient Care Team: Terald Sleeper, PA-C as PCP - General (General Practice) Okey Regal, Miner (Optometry) Herminio Commons, MD as Attending Physician (Cardiology) Truitt Merle, MD as Consulting Physician (Hematology) Leighton Ruff, MD as Consulting Physician (General Surgery) 06/12/2017  SUMMARY OF ONCOLOGIC HISTORY: Oncology History   Cancer Staging Cancer of right colon [facility]) Staging form: Colon and Rectum, AJCC 8th Edition - Pathologic stage from 05/07/2017: Stage IIIB (pT3, pN1a, cM0) - Signed by Truitt Merle, MD on 05/20/2017       Cancer of right colon ([place])   02/06/2017 Procedure    IMPRESSION: Colon, right, biopsy - Villous adenoma with multifocal high-grade dysplasia (multiple fragments), arising in a background of traditional serrated adenoma, see comment. See Comment Comment -There is a vigorous stromal reaction, possibly reaction to previous biopsy. While no definitive invasion is identified, the specimen appears small in comparison to the clinically described lesion and may not be representative.       03/13/2017 Imaging    CT AP W Contrast 03/13/17 IMPRESSION: 1. Stable moderate to large parastomal hernia containing multiple small bowel loops in the ventral left abdominal wall at the end colostomy site. No evidence of acute bowel complication. 2. Large hiatal hernia. 3. Finely irregular liver surface, suggesting cirrhosis. Consider hepatic elastography for further liver fibrosis risk stratification, as clinically warranted. 4. Stable 1.0 cm cystic pancreatic neck lesion. Follow-up MRI abdomen without with IV contrast recommended in 2 years. This recommendation follows ACR consensus guidelines: Management of Incidental Pancreatic Cysts: A White Paper of the ACR Incidental Findings Committee. J Am Coll Radiol 2694;85:[id number]. 5. Stable 4 mm left UPJ stone.  No  hydronephrosis. 6.  Aortic Atherosclerosis (ICD10-I70.0).  Coronary atherosclerosis.      05/07/2017 Surgery    Colectomy and parastomal hernia repair by [name]       05/07/2017 Pathology Results    Diagnosis 05/07/17 Colon, segmental resection for tumor, right colon and terminal ileum - INVASIVE COLORECTAL ADENOCARCINOMA, 3.3 CM. - TUMOR EXTENDS INTO PERICOLONIC CONNECTIVE TISSUE. - MARGINS NOT INVOLVED. - METASTATIC CARCINOMA IN ONE OF TWENTY-SIX LYMPH NODES (1/26).      05/20/2017 Initial Diagnosis    Cancer of right colon ([place])      06/05/2017 Imaging    CT CHEST W CONTRAST IMPRESSION: -No evidence of metastatic disease or other acute findings within the thorax. -Aortic Atherosclerosis (ICD10-I70.0) and Emphysema (ICD10-J43.9). -Coronary artery calcification. -Large hiatal hernia.      CURRENT THERAPY: PENDING adjuvant Xeloda to start ~06/23/17  INTERVAL HISTORY: Ms. Tafolla returns for follow-up as scheduled, presents with her sister and daughter.  She reports low energy and fatigue, requires effort to complete ADLs and activities.  She feels she is only 50% back to her pre-op energy level.  Previously exercised 3-5 times per week before surgery but has not been able to do so since then, she has trouble getting motivated.  She has constant soreness in her lower abdomen, 3/10.  She requires ibuprofen intermittently.  Tylenol does not help.  Has nausea almost daily in the morning, occasionally takes Xanax to calm her stomach.  She has low appetite in the mornings at baseline but will eat and drink in the afternoon.  Has not had anything to eat/drink yet today.  Mouth is dry.  Colostomy is working normally, empties 1-2 times per day.  Takes MiraLAX to maintain normal output.  No blood.  Denies  fever or chills; no cough, chest pain, leg swelling, or shortness of breath.  Denies dysuria or hematuria.  She is sleeping well.   REVIEW OF SYSTEMS:   Constitutional: Denies fevers, chills or  abnormal weight loss (+) mild-moderate fatigue (+) lacks motivation (+) low morning appetite, improves throughout the day  Eyes: Denies blurriness of vision Ears, nose, mouth, throat, and face: Denies mucositis or sore throat (+) dry mouth Respiratory: Denies cough, dyspnea or wheezes Cardiovascular: Denies palpitation, chest discomfort or lower extremity swelling Gastrointestinal:  Denies vomiting, constipation, diarrhea, heartburn, hematochezia, or change in bowel habits (+) colostomy (+) lower abdomen soreness, constant; 3/10 (+) intermittent morning nausea (+) known hiatal hernia Skin: Denies abnormal skin rashes Lymphatics: Denies new lymphadenopathy or easy bruising Neurological:Denies numbness, tingling or new weaknesses Behavioral/Psych: Mood is stable, no new changes  All other systems were reviewed with the patient and are negative.  MEDICAL HISTORY:  Past Medical History:  Diagnosis Date  . Anemia due to blood loss, chronic   . Anxiety state, unspecified   . Atrophy of left kidney   . CKD (chronic kidney disease), stage III ([place])   . Coronary artery disease cardiologist-  dr Bronson Ing   a. Moderate to severe coronary disease involving the left anterior descending artery and right coronary artery.  Sequential stenosis in the LAD is significant.  Right coronary artery is moderate to severely likely nonischemic. Questionable small LVOT obstruction.  No Brockenbrough maneuver was performed.  Catheterization January 2013 w/ PTCA and DES x1 to pLAD b. 05/2014 MV no isch/infarct, EF 60%.  . Depressive disorder, not elsewhere classified   . Diastolic CHF, chronic [facility])    cardiologist-  dr Bronson Ing  . GERD (gastroesophageal reflux disease)   . H/O hiatal hernia   . History of [name]'s esophagus   . History of melanoma excision    back  . Hypertension   . Hypothyroidism   . Macular degeneration    both eyes  . PAF (paroxysmal atrial fibrillation) ([place])   . PONV (postoperative  nausea and vomiting)   . Pulmonary hypertension ([place])    PA systolic [id number] mmHg by echocardiogram 06-17-2015  PA pressure 05-08-2011 by cardiac catheterization -- no sig. pulm. htn)PA saturation 62% thermodilution cardiac index 2.0 thick cardiac index 2.4  . Pure hypercholesterolemia   . S/P drug eluting coronary stent placement 05/08/2011   x1 DES to proximal LAD  . Wears dentures    lower  . Wears glasses     SURGICAL HISTORY: Past Surgical History:  Procedure Laterality Date  . CARDIAC CATHETERIZATION Left 03/05/2015   Procedure: CENTRAL LINE INSERTION;  Surgeon: Aviva Signs, MD;  Location: [facility];  Service: General;  Laterality: Left;  . CATARACT EXTRACTION W/ INTRAOCULAR LENS  IMPLANT, BILATERAL  ~ 2002  . CHOLECYSTECTOMY N/A 07/18/2016   Procedure: LAPAROSCOPIC CHOLECYSTECTOMY WITH INTRAOPERATIVE CHOLANGIOGRAM;  Surgeon: [name], MD;  Location: [facility];  Service: General;  Laterality: N/A;  . COLECTOMY WITH COLOSTOMY CREATION/HARTMANN PROCEDURE N/A 03/05/2015   Procedure: PARTIAL COLECTOMY WITH COLOSTOMY CREATION/HARTMANN PROCEDURE;  Surgeon: Aviva Signs, MD;  Location: [facility];  Service: General;  Laterality: N/A;  . ESOPHAGEAL MANOMETRY N/A 06/06/2014   Procedure: ESOPHAGEAL MANOMETRY (EM);  Surgeon: Jerene Bears, MD;  Location: [facility];  Service: Gastroenterology;  Laterality: N/A;  . ESOPHAGOGASTRODUODENOSCOPY (EGD) WITH PROPOFOL N/A 08/04/2015   Procedure: ESOPHAGOGASTRODUODENOSCOPY (EGD) WITH PROPOFOL;  Surgeon: Mauri Pole, MD;  Location: [facility];  Service: Endoscopy;  Laterality: N/A;  . HERNIA  REPAIR    . IR GENERIC HISTORICAL  03/04/2016   IR LUMBAR DISC ASPIRATION W/IMG GUIDE 03/04/2016 Luanne Bras, MD [facility]-INTERV RAD  . LAPAROSCOPIC NISSEN FUNDOPLICATION N/A 05/02/69   Procedure: LAPAROSCOPIC  LYSIS OF ADHESIONS, SEGMENTAL GASTRECTOMY, PARTIAL REDUCTION OF HERNIA;  Surgeon: [name], MD;  Location: [facility];  Service: General;  Laterality:  N/A;  . LAPAROSCOPIC PARTIAL COLECTOMY N/A 05/07/2017   Procedure: LAPAROSCOPIC  CONVERTED TO OPEN RIGHT COLECTOMY, PARASTOMAL HERNIA  REPAIR;  Surgeon: Leighton Ruff, MD;  Location: [facility];  Service: General;  Laterality: N/A;  . NISSEN FUNDOPLICATION  2197  . PERCUTANEOUS CORONARY STENT INTERVENTION (PCI-S) N/A 05/08/2011   Procedure: PERCUTANEOUS CORONARY STENT INTERVENTION (PCI-S);  Surgeon: Wellington Hampshire, MD;  Location: [facility] CATH LAB;  Service: Cardiovascular;  Laterality: N/A;  . ROTATOR CUFF REPAIR  ~ 2001   right  . SHOULDER ARTHROSCOPY  ~ 2004; 2005   left; "joint's wore out"  . TONSILLECTOMY  ~ 1944  . TUBAL LIGATION  1972    I have reviewed the social history and family history with the patient and they are unchanged from previous note.  ALLERGIES:  is allergic to sulfamethoxazole.  MEDICATIONS:  Current Outpatient Medications  Medication Sig Dispense Refill  . albuterol (PROVENTIL HFA;VENTOLIN HFA) 108 (90 Base) MCG/ACT inhaler Inhale 2 puffs into the lungs every 6 (six) hours as needed for wheezing or shortness of breath.     . ALPRAZolam (XANAX) 0.25 MG tablet TAKE 1/2 TO 1 TABLET 3 TIMES DAILY AS NEEDED 270 tablet 1  . amLODipine (NORVASC) 5 MG tablet TAKE ONE (1) TABLET EACH DAY (Patient taking differently: Take 5 mgs by mouth once daily at night) 90 tablet 1  . citalopram (CELEXA) 40 MG tablet TAKE ONE (1) TABLET EACH DAY (Patient taking differently: TAKE ONE (1) TABLET EACH DAY AT NIGHT) 90 tablet 3  . esomeprazole (NEXIUM) 40 MG capsule TAKE ONE (1) CAPSULE EACH DAY (Patient taking differently: TAKE ONE (1) CAPSULE EACH DAY--- takes in am) 90 capsule 1  . ferrous sulfate 325 (65 FE) MG EC tablet Take 325 mg by mouth daily with breakfast.     . fluticasone (FLONASE) 50 MCG/ACT nasal spray USE 2 SPRAYS IN EACH NOSTRIL DAILY (Patient taking differently: USE 2 SPRAYS IN EACH NOSTRIL DAILY AS NEEDED FOR CONGESTION) 48 g 1  . furosemide (LASIX) 20 MG tablet TAKE ONE (1) TABLET  EACH DAY (Patient taking differently: TAKE ONE (1) TABLET EACH DAY--- takes in am) 90 tablet 1  . hydrALAZINE (APRESOLINE) 50 MG tablet TAKE ONE (1) TABLET THREE (3) TIMES EACH DAY 270 tablet 1  . ibuprofen (ADVIL,MOTRIN) 200 MG tablet Take 200-400 mg by mouth daily as needed for headache or moderate pain.    . isosorbide mononitrate (IMDUR) 30 MG 24 hr tablet TAKE ONE TABLET EVERY MORNING 90 tablet 1  . levothyroxine (SYNTHROID, LEVOTHROID) 50 MCG tablet Take 1 tablet (50 mcg total) by mouth daily. (Patient taking differently: Take 50 mcg by mouth daily before breakfast. ) 90 tablet 0  . loratadine (CLARITIN) 10 MG tablet Take 1 tablet (10 mg total) by mouth daily. (Patient taking differently: Take 10 mg by mouth at bedtime. ) 90 tablet 3  . losartan (COZAAR) 100 MG tablet Take 0.5 tablets (50 mg total) by mouth daily. (Patient taking differently: Take 50 mg by mouth every morning. ) 90 tablet 1  . metoprolol succinate (TOPROL-XL) 25 MG 24 hr tablet TAKE ONE TABLET BY MOUTH TWICE DAILY (Patient  taking differently: Take 25 mg by mouth once daily at night) 180 tablet 1  . montelukast (SINGULAIR) 10 MG tablet Take 1 tablet (10 mg total) by mouth at bedtime. 90 tablet 3  . polyethylene glycol powder (GLYCOLAX/MIRALAX) powder MIX 17 GRAMS IN 80Z OF WATER/JUICE AND DRINK TWICE DAILY (Patient taking differently: every other day. MIX 17 GRAMS IN 80Z OF WATER/JUICE AND DRINK ONCE DAILY) 1054 g 2  . potassium chloride (K-DUR) 10 MEQ tablet TAKE ONE (1) TABLET EACH DAY (Patient taking differently: TAKE ONE (1) TABLET EACH DAY--- takes in am) 90 tablet 1  . Propylene Glycol (SYSTANE BALANCE OP) Apply 1 drop to eye daily as needed (dry eyes).    . rosuvastatin (CRESTOR) 10 MG tablet TAKE ONE (1) TABLET EACH DAY (Patient taking differently: Take 10 mgs by mouth daily at night) 90 tablet 1  . traZODone (DESYREL) 100 MG tablet TAKE ONE TABLET DAILY AT BEDTIME 90 tablet 1  . Vitamin D, Ergocalciferol, (DRISDOL) 50000  units CAPS capsule TAKE 1 CAPSULE EVERY WEEK ON TUESDAYS (Patient taking differently: Take 50,000 Units by mouth every 14 (fourteen) days. ) 12 capsule 3  . zolpidem (AMBIEN) 10 MG tablet TAKE ONE (1) TABLET AT BEDTIME 90 tablet 0  . nitroGLYCERIN (NITROSTAT) 0.4 MG SL tablet Place 1 tablet (0.4 mg total) under the tongue every 5 (five) minutes as needed for chest pain. Reported on 04/26/2015 25 tablet 3  . ondansetron (ZOFRAN) 8 MG tablet Take 1 tablet (8 mg total) by mouth every 8 (eight) hours as needed for nausea or vomiting. 20 tablet 0  . traMADol (ULTRAM) 50 MG tablet Take 1 tablet (50 mg total) by mouth every 12 (twelve) hours as needed. 20 tablet 1  . XELODA 500 MG tablet Take 3 tablets (1541m) by mouth 2 times daily, within 30 min of finishing food. Take for 14 days on, 7 days off, repeat every 21 days 84 tablet 2   No current facility-administered medications for this visit.     PHYSICAL EXAMINATION: ECOG PERFORMANCE STATUS: 2-3   Vitals:   06/12/17 0907  BP: (!) 153/62  Pulse: (!) 50  Resp: 17  Temp: 97.9 F (36.6 C)  SpO2: 97%   Filed Weights   06/12/17 0907  Weight: 144 lb (65.3 kg)    GENERAL:alert, no distress and comfortable SKIN: skin color, texture, turgor are normal, no rashes or significant lesions EYES: normal, Conjunctiva are pink and non-injected, sclera clear OROPHARYNX:no exudate or erythema (+) dry lips  LYMPH:  no palpable cervical, supraclavicular, axillary, or inguinal lymphadenopathy LUNGS: clear to auscultation bilaterally with normal breathing effort HEART: regular rate & rhythm and no murmurs and no lower extremity edema ABDOMEN:abdomen soft, non-tender and normal bowel sounds. No hepatomegaly. (+) midline surgical incision is closed, healed well, no erythema or drainage (+) colostomy  Musculoskeletal:no cyanosis of digits and no clubbing  NEURO: alert & oriented x 3 with fluent speech, no focal motor/sensory deficits  LABORATORY DATA:  I have  reviewed the data as listed CBC Latest Ref Rng & Units 06/12/2017 05/21/2017 05/10/2017  WBC 3.9 - 10.3 K/uL 5.0 6.4 9.6  Hemoglobin 12.0 - 15.0 g/dL - - 9.4(L)  Hematocrit 34.8 - 46.6 % 34.0(L) 30.1(L) 28.1(L)  Platelets 145 - 400 K/uL 166 298 197     CMP Latest Ref Rng & Units 06/12/2017 05/21/2017 05/12/2017  Glucose 70 - 140 mg/dL 98 90 103(H)  BUN 7 - 26 mg/dL _0 Creatinine 0.60 - 1.10  mg/dL 1.46(H) 1.69(H) 1.27(H)  Sodium 136 - 145 mmol/L 142 141 131(L)  Potassium 3.5 - 5.1 mmol/L 4.0 3.5 3.8  Chloride 98 - 109 mmol/L 107 106 98(L)  CO2 22 - 29 mmol/L _0 Calcium 8.4 - 10.4 mg/dL 9.3 9.4 8.5(L)  Total Protein 6.4 - 8.3 g/dL 6.4 6.7 -  Total Bilirubin 0.2 - 1.2 mg/dL 0.2 <0.2(L) -  Alkaline Phos 40 - 150 U/L 60 58 -  AST 5 - 34 U/L 14 9 -  ALT 0 - 55 U/L <6 <6 -   PATHOLOGY Diagnosis 05/07/17 Colon, segmental resection for tumor, right colon and terminal ileum - INVASIVE COLORECTAL ADENOCARCINOMA, 3.3 CM. - TUMOR EXTENDS INTO PERICOLONIC CONNECTIVE TISSUE. - MARGINS NOT INVOLVED. - METASTATIC CARCINOMA IN ONE OF TWENTY-SIX LYMPH NODES (1/26). Microscopic Comment COLON AND RECTUM (INCLUDING TRANS-ANAL RESECTION): Specimen: Right colon and terminal ileum. Procedure: Resection. Tumor site: Distal ascending colon. Specimen integrity: Intact. Macroscopic intactness of mesorectum: Not applicable. Macroscopic tumor perforation: No. Invasive tumor: Maximum size: 3.3 cm. Histologic type(s): Colorectal adenocarcinoma. Histologic grade and differentiation: G2: moderately differentiated/low grade Type of polyp in which invasive carcinoma arose: Tubulovillous adenoma. Microscopic extension of invasive tumor: Into pericolonic connective tissue. Lymph-Vascular invasion: Present. Peri-neural invasion: No. Tumor deposit(s) (discontinuous extramural extension): No. Resection margins: Proximal margin: Free of tumor. Distal margin: Free of tumor. Circumferential (radial)  (posterior ascending, posterior descending; lateral and posterior mid-rectum; and entire lower 1/3 rectum): Free of tumor. Mesenteric margin (sigmoid and transverse): N/A. Distance closest margin (if all above margins negative): 0.3 cm from circumferential radial margin. Treatment effect (neo-adjuvant therapy): No. 1 of 5 Supplemental copy SUPPLEMENTAL for Washer, Shawntia I ([id number]) Microscopic Comment(continued) Additional polyp(s): Four tubular adenomas. Non-neoplastic findings: Benign appendix. Lymph nodes: number examined 26; number positive: 1. Pathologic Staging: pT3, pN1a, pMX. Ancillary studies: Microsatellite instability by PCR and mismatch repair protein by IHC. (JDP:ah 05/09/17)    RADIOGRAPHIC STUDIES: I have personally reviewed the radiological images as listed and agreed with the findings in the report. No results found.   ASSESSMENT & PLAN: Manjit I Colligan 83 y.o. female is a here because of recently diagnosed invasive colorectal adenocarcinoma.   1. Right colon adenocarcinoma Stage IIIB (pT3, pN1A, cM0)  -Ms. Fehring appears stable, continuing to recover from surgery. I reviewed CT Chest with her and family which show no evidence of metastatic disease in the chest. There is a large hiatal hernia which she has had for years. She had recent echo which shows EF 55-60%, will f/u with cardiology on 06/16/17.  -Still has mild nausea and pain with fatigue and low appetite. I encouraged her to eat small frequent amounts and increase her liquid intake. I increased zofran to better control nausea which will hopefully improve po intake.  -Given her fatigue and still somewhat low PS, will give her additional time to recover from surgery before beginning adjuvant Xeloda. She will call clinic in 2 weeks to possibly start Xeloda 1500 mg BID x14 days on and 7 days off; we reviewed treatment timeline; while standard of care is 6 months, if she cannot tolerate the treatment will discontinue sooner.  Patient and family agree with the plan. She will increase nutrition and activity in the interim.  -F/u in 4-5 weeks, which should be approx 3 weeks after beginning Xeloda -She will attend chemo education class today  2. HTN, CAD, diastolic CHF, AF, CKD stage III -lasix changed to QOD on 05/22/17 due to worsening renal function. Reviewed that  if not drinking enough while on Xeloda and BP is low, no leg swelling, she will hold lasix. Recommended to check BP daily. No LE edema on today's exam. -Renal function is compromised but slightly improved, Cr. 1.49 today. I encouraged her to avoid NSAIDs and continue to hydrate adequately. -if she is drinking adequately when she calls in 2 weeks, will likely proceed to chemo and see her back as scheduled; if she indicates she is not adequately hydrated will bring her in for lab check prior to starting xeloda.  -She is on multiple blood pressure medications, if she becomes dehydrated, lightheaded, dizzy, she knows to check her BP and call [place] for medication guidance, may need to hold some BP meds while on chemo. -Echo on 05/28/17 with EF 55-60%, f/u with cardiology 06/16/17.   3. Iron deficiency anemia -Hgb is improving, 10.7 today; iron studies are adequate. Continue oral iron 1 tablet daily. Will check periodically  4. Nausea, abdominal pain She has mild post-op nausea and abdominal discomfort. I increased zofran to 8 mg q8 PRN. I suggest she avoid NSAIDs due to decreased renal function. Tylenol is not helpful. I prescribed tramadol PRN. She is elderly and lives alone; I cautioned her on side effects.    PLAN: -Chemo class today -Continue recovering from surgery, increase po intake hydration, and activity as tolerated  -Patient to call in 2 weeks to update on her status, if feeling better and adequately hydrated, proceed with [place]; if not much improvement or not drinking well, return for lab check to monitor renal function -F/u in 4-5 weeks, or sooner if  needed -Increased zofran to 8 mg -Prescribed tramadol for pain   All questions were answered. The patient knows to call the clinic with any problems, questions or concerns. No barriers to learning was detected. I spent 30 minutes counseling the patient face to face. The total time spent in the appointment was 40 minutes and more than 50% was on counseling and review of test results     Alla Feeling, NP 06/12/17   Addendum  I have seen the patient, examined her. I agree with the assessment and and plan and have edited the notes.   Ms Penson has been recovering slowly from her surgery.  She still has moderate fatigue, not very active.  Lab reviewed, her renal function has slightly improved.  We again discussed the benefits and side effects of adjuvant Xeloda, she will participate in chemo class today.  Due to her incomplete recovery, I recommend her to wait for another 2 weeks before she starts Xeloda, encourage her to eat more, and try to be more physically active.  I will have low threshold to hold her chemotherapy if she experience significant side effects, advanced age.  She knows to call us if she has further questions.  Truitt Merle  06/12/2017

## 2017-06-13 ENCOUNTER — Encounter: Payer: Self-pay | Admitting: Cardiovascular Disease

## 2017-06-13 ENCOUNTER — Other Ambulatory Visit: Payer: Self-pay

## 2017-06-13 ENCOUNTER — Ambulatory Visit (INDEPENDENT_AMBULATORY_CARE_PROVIDER_SITE_OTHER): Payer: Medicare Other | Admitting: Cardiovascular Disease

## 2017-06-13 VITALS — BP 116/62 | HR 62 | Ht 62.5 in | Wt 144.0 lb

## 2017-06-13 DIAGNOSIS — I480 Paroxysmal atrial fibrillation: Secondary | ICD-10-CM

## 2017-06-13 DIAGNOSIS — R001 Bradycardia, unspecified: Secondary | ICD-10-CM | POA: Diagnosis not present

## 2017-06-13 DIAGNOSIS — I5032 Chronic diastolic (congestive) heart failure: Secondary | ICD-10-CM

## 2017-06-13 DIAGNOSIS — I10 Essential (primary) hypertension: Secondary | ICD-10-CM | POA: Diagnosis not present

## 2017-06-13 DIAGNOSIS — I25118 Atherosclerotic heart disease of native coronary artery with other forms of angina pectoris: Secondary | ICD-10-CM | POA: Diagnosis not present

## 2017-06-13 MED ORDER — FUROSEMIDE 20 MG PO TABS: 20.0000 mg | ORAL_TABLET | ORAL | Status: DC | PRN

## 2017-06-13 NOTE — Patient Instructions (Signed)
Medication Instructions:   Decrease Lasix to as needed.   Continue all other medications.    Labwork: none  Testing/Procedures: none  Follow-Up: Your physician wants you to follow up in:  1 year.  You will receive a reminder letter in the mail one-two months in advance.  If you don't receive a letter, please call our office to schedule the follow up appointment   Any Other Special Instructions Will Be Listed Below (If Applicable).  If you need a refill on your cardiac medications before your next appointment, please call your pharmacy.

## 2017-06-13 NOTE — Addendum Note (Signed)
Addended by: Laurine Blazer on: 06/13/2017 02:40 PM   Modules accepted: Orders

## 2017-06-13 NOTE — Progress Notes (Signed)
SUBJECTIVE: The patient presents for routine follow-up.  She underwent an open right colectomy and parastomal hernia repair in February.  She has a history of coronary artery disease, paroxysmal atrialfibrillation, chronic diastolic heart failure.  Echocardiogram 05/28/17 showed normal left ventricular systolic function and regional wall motion, LVEF 63-87%, grade 2 diastolic dysfunction with high ventricular filling pressures, and mild mitral regurgitation.  There was severe left atrial dilatation.  The patient denies any symptoms of chest pain, palpitations, shortness of breath, lightheadedness, dizziness, leg swelling, orthopnea, PND, and syncope.  She has begun chemotherapy for colon cancer.  She plans to begin taking exercise classes for both her mental and physical health.    Review of Systems: As per "subjective", otherwise negative.  Allergies  Allergen Reactions  . Sulfamethoxazole Rash    Current Outpatient Medications  Medication Sig Dispense Refill  . albuterol (PROVENTIL HFA;VENTOLIN HFA) 108 (90 Base) MCG/ACT inhaler Inhale 2 puffs into the lungs every 6 (six) hours as needed for wheezing or shortness of breath.     . ALPRAZolam (XANAX) 0.25 MG tablet TAKE 1/2 TO 1 TABLET 3 TIMES DAILY AS NEEDED 270 tablet 1  . amLODipine (NORVASC) 5 MG tablet TAKE ONE (1) TABLET EACH DAY (Patient taking differently: Take 5 mgs by mouth once daily at night) 90 tablet 1  . citalopram (CELEXA) 40 MG tablet TAKE ONE (1) TABLET EACH DAY (Patient taking differently: TAKE ONE (1) TABLET EACH DAY AT NIGHT) 90 tablet 3  . esomeprazole (NEXIUM) 40 MG capsule TAKE ONE (1) CAPSULE EACH DAY (Patient taking differently: TAKE ONE (1) CAPSULE EACH DAY--- takes in am) 90 capsule 1  . ferrous sulfate 325 (65 FE) MG EC tablet Take 325 mg by mouth daily with breakfast.     . fluticasone (FLONASE) 50 MCG/ACT nasal spray USE 2 SPRAYS IN EACH NOSTRIL DAILY (Patient taking differently: USE 2 SPRAYS IN  EACH NOSTRIL DAILY AS NEEDED FOR CONGESTION) 48 g 1  . furosemide (LASIX) 20 MG tablet TAKE ONE (1) TABLET EACH DAY (Patient taking differently: TAKE ONE (1) TABLET EACH DAY--- takes in am) 90 tablet 1  . hydrALAZINE (APRESOLINE) 50 MG tablet TAKE ONE (1) TABLET THREE (3) TIMES EACH DAY 270 tablet 1  . ibuprofen (ADVIL,MOTRIN) 200 MG tablet Take 200-400 mg by mouth daily as needed for headache or moderate pain.    . isosorbide mononitrate (IMDUR) 30 MG 24 hr tablet TAKE ONE TABLET EVERY MORNING 90 tablet 1  . levothyroxine (SYNTHROID, LEVOTHROID) 50 MCG tablet Take 1 tablet (50 mcg total) by mouth daily. (Patient taking differently: Take 50 mcg by mouth daily before breakfast. ) 90 tablet 0  . loratadine (CLARITIN) 10 MG tablet Take 1 tablet (10 mg total) by mouth daily. (Patient taking differently: Take 10 mg by mouth at bedtime. ) 90 tablet 3  . losartan (COZAAR) 100 MG tablet Take 0.5 tablets (50 mg total) by mouth daily. (Patient taking differently: Take 50 mg by mouth every morning. ) 90 tablet 1  . metoprolol succinate (TOPROL-XL) 25 MG 24 hr tablet TAKE ONE TABLET BY MOUTH TWICE DAILY (Patient taking differently: Take 25 mg by mouth once daily at night) 180 tablet 1  . montelukast (SINGULAIR) 10 MG tablet Take 1 tablet (10 mg total) by mouth at bedtime. 90 tablet 3  . nitroGLYCERIN (NITROSTAT) 0.4 MG SL tablet Place 1 tablet (0.4 mg total) under the tongue every 5 (five) minutes as needed for chest pain. Reported on 04/26/2015 25 tablet  3  . ondansetron (ZOFRAN) 8 MG tablet Take 1 tablet (8 mg total) by mouth every 8 (eight) hours as needed for nausea or vomiting. 20 tablet 0  . polyethylene glycol powder (GLYCOLAX/MIRALAX) powder MIX 17 GRAMS IN 80Z OF WATER/JUICE AND DRINK TWICE DAILY (Patient taking differently: every other day. MIX 17 GRAMS IN 80Z OF WATER/JUICE AND DRINK ONCE DAILY) 1054 g 2  . potassium chloride (K-DUR) 10 MEQ tablet TAKE ONE (1) TABLET EACH DAY (Patient taking differently:  TAKE ONE (1) TABLET EACH DAY--- takes in am) 90 tablet 1  . Propylene Glycol (SYSTANE BALANCE OP) Apply 1 drop to eye daily as needed (dry eyes).    . rosuvastatin (CRESTOR) 10 MG tablet TAKE ONE (1) TABLET EACH DAY (Patient taking differently: Take 10 mgs by mouth daily at night) 90 tablet 1  . traMADol (ULTRAM) 50 MG tablet Take 1 tablet (50 mg total) by mouth every 12 (twelve) hours as needed. 20 tablet 1  . traZODone (DESYREL) 100 MG tablet TAKE ONE TABLET DAILY AT BEDTIME 90 tablet 1  . Vitamin D, Ergocalciferol, (DRISDOL) 50000 units CAPS capsule TAKE 1 CAPSULE EVERY WEEK ON TUESDAYS (Patient taking differently: Take 50,000 Units by mouth every 14 (fourteen) days. ) 12 capsule 3  . XELODA 500 MG tablet Take 3 tablets (1500mg ) by mouth 2 times daily, within 30 min of finishing food. Take for 14 days on, 7 days off, repeat every 21 days 84 tablet 2  . zolpidem (AMBIEN) 10 MG tablet TAKE ONE (1) TABLET AT BEDTIME 90 tablet 0   No current facility-administered medications for this visit.     Past Medical History:  Diagnosis Date  . Anemia due to blood loss, chronic   . Anxiety state, unspecified   . Atrophy of left kidney   . CKD (chronic kidney disease), stage III ([place])   . Coronary artery disease cardiologist-  dr Bronson Ing   a. Moderate to severe coronary disease involving the left anterior descending artery and right coronary artery.  Sequential stenosis in the LAD is significant.  Right coronary artery is moderate to severely likely nonischemic. Questionable small LVOT obstruction.  No Brockenbrough maneuver was performed.  Catheterization January 2013 w/ PTCA and DES x1 to pLAD b. 05/2014 MV no isch/infarct, EF 60%.  . Depressive disorder, not elsewhere classified   . Diastolic CHF, chronic [facility])    cardiologist-  dr Bronson Ing  . GERD (gastroesophageal reflux disease)   . H/O hiatal hernia   . History of [name]'s esophagus   . History of melanoma excision    back  . Hypertension    . Hypothyroidism   . Macular degeneration    both eyes  . PAF (paroxysmal atrial fibrillation) ([place])   . PONV (postoperative nausea and vomiting)   . Pulmonary hypertension ([place])    PA systolic [id number] mmHg by echocardiogram 06-17-2015  PA pressure 05-08-2011 by cardiac catheterization -- no sig. pulm. htn)PA saturation 62% thermodilution cardiac index 2.0 thick cardiac index 2.4  . Pure hypercholesterolemia   . S/P drug eluting coronary stent placement 05/08/2011   x1 DES to proximal LAD  . Wears dentures    lower  . Wears glasses     Past Surgical History:  Procedure Laterality Date  . CARDIAC CATHETERIZATION Left 03/05/2015   Procedure: CENTRAL LINE INSERTION;  Surgeon: Aviva Signs, MD;  Location: [facility];  Service: General;  Laterality: Left;  . CATARACT EXTRACTION W/ INTRAOCULAR LENS  IMPLANT, BILATERAL  ~ 2002  .  CHOLECYSTECTOMY N/A 07/18/2016   Procedure: LAPAROSCOPIC CHOLECYSTECTOMY WITH INTRAOPERATIVE CHOLANGIOGRAM;  Surgeon: [name], MD;  Location: [facility];  Service: General;  Laterality: N/A;  . COLECTOMY WITH COLOSTOMY CREATION/HARTMANN PROCEDURE N/A 03/05/2015   Procedure: PARTIAL COLECTOMY WITH COLOSTOMY CREATION/HARTMANN PROCEDURE;  Surgeon: Aviva Signs, MD;  Location: [facility];  Service: General;  Laterality: N/A;  . ESOPHAGEAL MANOMETRY N/A 06/06/2014   Procedure: ESOPHAGEAL MANOMETRY (EM);  Surgeon: Jerene Bears, MD;  Location: [facility];  Service: Gastroenterology;  Laterality: N/A;  . ESOPHAGOGASTRODUODENOSCOPY (EGD) WITH PROPOFOL N/A 08/04/2015   Procedure: ESOPHAGOGASTRODUODENOSCOPY (EGD) WITH PROPOFOL;  Surgeon: Mauri Pole, MD;  Location: [facility];  Service: Endoscopy;  Laterality: N/A;  . HERNIA REPAIR    . IR GENERIC HISTORICAL  03/04/2016   IR LUMBAR DISC ASPIRATION W/IMG GUIDE 03/04/2016 Luanne Bras, MD [facility]  . LAPAROSCOPIC NISSEN FUNDOPLICATION N/A 09/22/7626   Procedure: LAPAROSCOPIC  LYSIS OF ADHESIONS, SEGMENTAL  GASTRECTOMY, PARTIAL REDUCTION OF HERNIA;  Surgeon: [name], MD;  Location: [facility];  Service: General;  Laterality: N/A;  . LAPAROSCOPIC PARTIAL COLECTOMY N/A 05/07/2017   Procedure: LAPAROSCOPIC  CONVERTED TO OPEN RIGHT COLECTOMY, PARASTOMAL HERNIA  REPAIR;  Surgeon: Leighton Ruff, MD;  Location: [facility];  Service: General;  Laterality: N/A;  . NISSEN FUNDOPLICATION  3151  . PERCUTANEOUS CORONARY STENT INTERVENTION (PCI-S) N/A 05/08/2011   Procedure: PERCUTANEOUS CORONARY STENT INTERVENTION (PCI-S);  Surgeon: Wellington Hampshire, MD;  Location: [facility] CATH LAB;  Service: Cardiovascular;  Laterality: N/A;  . ROTATOR CUFF REPAIR  ~ 2001   right  . SHOULDER ARTHROSCOPY  ~ 2004; 2005   left; "joint's wore out"  . TONSILLECTOMY  ~ 1944  . TUBAL LIGATION  1972    Social History   Socioeconomic History  . Marital status: Divorced    Spouse name: Not on file  . Number of children: 3  . Years of education: Not on file  . Highest education level: Not on file  Social Needs  . Financial resource strain: Not on file  . Food insecurity - worry: Not on file  . Food insecurity - inability: Not on file  . Transportation needs - medical: Not on file  . Transportation needs - non-medical: Not on file  Occupational History  . Occupation: Retired  Tobacco Use  . Smoking status: Former Smoker    Packs/day: 1.00    Years: 20.00    Pack years: 20.00    Types: Cigarettes    Start date: 10/12/1958    Last attempt to quit: 04/26/1988    Years since quitting: 29.1  . Smokeless tobacco: Never Used  Substance and Sexual Activity  . Alcohol use: No    Alcohol/week: 0.0 oz  . Drug use: No  . Sexual activity: No  Other Topics Concern  . Not on file  Social History Narrative   Divorced.  Lives alone.  Ambulates with a walker.      Vitals:   06/13/17 1346  BP: 116/62  Pulse: 62  SpO2: 93%  Weight: 144 lb (65.3 kg)  Height: 5' 2.5" (1.588 m)    Wt Readings from Last 3 Encounters:  06/13/17 144  lb (65.3 kg)  06/12/17 144 lb (65.3 kg)  05/21/17 140 lb 8 oz (63.7 kg)     PHYSICAL EXAM General: NAD HEENT: Normal. Neck: No JVD, no thyromegaly. Lungs: Clear to auscultation bilaterally with normal respiratory effort. CV: Regular rate and rhythm, normal S1/S2, no V6/H6, soft systolic murmur over right upper  sternal border. No pretibial or periankle edema.  No carotid bruit.   Abdomen: Soft, nontender, no distention.  Neurologic: Alert and oriented.  Psych: Normal affect. Skin: Normal. Musculoskeletal: No gross deformities.    ECG: Most recent ECG reviewed.   Labs: Lab Results  Component Value Date/Time   K 4.0 06/12/2017 08:29 AM   BUN 18 06/12/2017 08:29 AM   BUN 18 01/15/2017 08:38 AM   CREATININE 1.46 (H) 06/12/2017 08:29 AM   ALT <6 06/12/2017 08:29 AM   TSH 7.040 (H) 04/18/2017 09:30 AM   HGB 9.4 (L) 05/10/2017 05:20 AM   HGB 10.9 (L) 01/15/2017 08:38 AM     Lipids: Lab Results  Component Value Date/Time   LDLCALC 84 01/15/2017 08:38 AM   CHOL 166 01/15/2017 08:38 AM   TRIG 184 (H) 01/15/2017 08:38 AM   HDL 45 01/15/2017 08:38 AM       ASSESSMENT AND PLAN:  1. Coronary artery disease with LAD stent: Symptomatically stable. Normal nuclear stress test in February 2016. She is not on aspirin due to high bleeding risk and history of anemia. I will continue long-acting nitrates, metoprolol succinate, and Crestor.  2. Hypertension:  Controlled on present therapy. No changes.  3. Symptomatic bradycardia/paroxysmal atrial fibrillation: Stable on present dose of metoprolol. She is not on anticoagulation due to being a high bleeding risk, falls risk, and history of anemia. She was evaluated by Dr. Rayann Heman in the past and he felt she was not a candidate for the Watchman device although it could be considered in the future.  4. Chronic diastolicfailure: Euvolemic.  She is taking chemotherapy, she is prone to dehydration.  I told her to take Lasix as needed.  She  has grade 2 diastolic dysfunction with high filling pressures as noted above.     Disposition: Follow up 1 year  Kate Sable, M.D., F.A.C.C.

## 2017-06-16 ENCOUNTER — Ambulatory Visit: Payer: Medicare Other | Admitting: Cardiovascular Disease

## 2017-06-24 ENCOUNTER — Telehealth: Payer: Self-pay | Admitting: *Deleted

## 2017-06-24 NOTE — Telephone Encounter (Signed)
Pt called with questions on how to take Xeloda.   Gave pt instructions as prescribed.  Pt stated she is feeling better, drinking po fluids as tolerated.  Stated she plans on starting Xeloda on Thursday  06/26/17.

## 2017-06-24 NOTE — Telephone Encounter (Signed)
Will you please call her back and see if she is agreeable to lab only today or tomorrow before she starts? If yes I'll send schedule message. Thanks, Regan Rakers

## 2017-06-25 ENCOUNTER — Inpatient Hospital Stay: Payer: Medicare Other

## 2017-06-25 DIAGNOSIS — C182 Malignant neoplasm of ascending colon: Secondary | ICD-10-CM | POA: Diagnosis not present

## 2017-06-25 LAB — CMP (CANCER CENTER ONLY)
ALBUMIN: 3.6 g/dL (ref 3.5–5.0)
ALK PHOS: 67 U/L (ref 40–150)
ALT: 6 U/L (ref 0–55)
AST: 11 U/L (ref 5–34)
Anion gap: 9 (ref 3–11)
BILIRUBIN TOTAL: 0.5 mg/dL (ref 0.2–1.2)
BUN: 13 mg/dL (ref 7–26)
CALCIUM: 9.6 mg/dL (ref 8.4–10.4)
CO2: 25 mmol/L (ref 22–29)
CREATININE: 1.43 mg/dL — AB (ref 0.60–1.10)
Chloride: 108 mmol/L (ref 98–109)
GFR, EST NON AFRICAN AMERICAN: 34 mL/min — AB (ref >60)
GFR, Est AFR Am: 39 mL/min — ABNORMAL LOW (ref >60)
GLUCOSE: 89 mg/dL (ref 70–140)
Potassium: 3.8 mmol/L (ref 3.5–5.1)
Sodium: 142 mmol/L (ref 136–145)
Total Protein: 6.6 g/dL (ref 6.4–8.3)

## 2017-06-25 LAB — CBC WITH DIFFERENTIAL (CANCER CENTER ONLY)
BASOS ABS: 0 10*3/uL (ref 0.0–0.1)
BASOS PCT: 0 %
EOS ABS: 0.2 10*3/uL (ref 0.0–0.5)
EOS PCT: 5 %
HCT: 35.4 % (ref 34.8–46.6)
Hemoglobin: 11.2 g/dL — ABNORMAL LOW (ref 11.6–15.9)
Lymphocytes Relative: 20 %
Lymphs Abs: 1 10*3/uL (ref 0.9–3.3)
MCH: 26.7 pg (ref 25.1–34.0)
MCHC: 31.6 g/dL (ref 31.5–36.0)
MCV: 84.3 fL (ref 79.5–101.0)
MONO ABS: 0.3 10*3/uL (ref 0.1–0.9)
Monocytes Relative: 7 %
Neutro Abs: 3.3 10*3/uL (ref 1.5–6.5)
Neutrophils Relative %: 68 %
PLATELETS: 171 10*3/uL (ref 145–400)
RBC: 4.2 MIL/uL (ref 3.70–5.45)
RDW: 16.1 % — AB (ref 11.2–14.5)
WBC: 4.8 10*3/uL (ref 3.9–10.3)

## 2017-06-26 ENCOUNTER — Telehealth: Payer: Self-pay | Admitting: Nurse Practitioner

## 2017-06-26 NOTE — Telephone Encounter (Signed)
I called [name] to review recent labs results and Xeloda. Her renal function remains decreased, but stable. I encouraged her to adequately hydrate with water and decaffeinated drinks. OK to begin Xeloda today as planned, 1500 mg BID x2 weeks on, 1 week off. I reviewed possible side effects and symptom management including taking imodium for diarrhea to prevent dehydration and further renal dysfunction. She understands. Will reschedule her f/u to coincide mid-cycle to monitor renal function and labs while she is taking Xeloda. Will send schedule message to f/u in 10 days.

## 2017-06-27 ENCOUNTER — Telehealth: Payer: Self-pay | Admitting: Nurse Practitioner

## 2017-06-27 NOTE — Telephone Encounter (Signed)
I called patient back in response to a voicemail she left letting staff know she decided to stop Xeloda. She began on 06/26/17, has taken 3 doses. She feels she cannot drink enough water/fluid that is required of her while on chemo. Yesterday she ate a popsicle, one 8-oz bottle of water, and some ensure then felt nauseous like she would "blow up." She took an anti-emetic that did not help. She says when she drinks too much she can't eat. She reports she has always had a "crazy stomach." I offered for her to consider continuing Xeldo at lower dose, she declined. She values quality of life rather than quantity of life at her age. She wants to enjoy her life and does not feel she can do so while on [place]. She is concerned about her kidney function and wants to preserve what she has left. Also she doesn't want to have to "force" herself to drink and worry about kidneys. She understands the risk of recurrence without adjuvant chemotherapy.  She is clear about her decision. Will stop Carlyle Basques now. Will make Dr. Burr Medico aware. F/u as scheduled.

## 2017-07-02 ENCOUNTER — Telehealth: Payer: Self-pay | Admitting: Hematology

## 2017-07-02 NOTE — Telephone Encounter (Signed)
Spoke with patient confirming 4/8 lab/fu. Patient aware 4/15 lab/fu cxd. Message to [place] to cx 4/15 lab/fu as requested per 3/28 schedule message.

## 2017-07-04 NOTE — Progress Notes (Signed)
[place]  [phone] [phone]) [phone]  Clinic Follow Up Note   Patient Care Team: Terald Sleeper, PA-C as PCP - General (General Practice) Okey Regal, [place] (Optometry) Herminio Commons, MD as Attending Physician (Cardiology) Truitt Merle, MD as Consulting Physician (Hematology) Leighton Ruff, MD as Consulting Physician (General Surgery) 07/07/2017   CHIEF COMPLAINTS:  Follow up invasive colorectal adenocarcinoma  Oncology History   Cancer Staging Cancer of right colon [facility]) Staging form: Colon and Rectum, AJCC 8th Edition - Pathologic stage from 05/07/2017: Stage IIIB (pT3, pN1a, cM0) - Signed by Truitt Merle, MD on 05/20/2017       Cancer of right colon ([place])   02/06/2017 Procedure    IMPRESSION: Colon, right, biopsy - Villous adenoma with multifocal high-grade dysplasia (multiple fragments), arising in a background of traditional serrated adenoma, see comment. See Comment Comment -There is a vigorous stromal reaction, possibly reaction to previous biopsy. While no definitive invasion is identified, the specimen appears small in comparison to the clinically described lesion and may not be representative.       03/13/2017 Imaging    CT AP W Contrast 03/13/17 IMPRESSION: 1. Stable moderate to large parastomal hernia containing multiple small bowel loops in the ventral left abdominal wall at the end colostomy site. No evidence of acute bowel complication. 2. Large hiatal hernia. 3. Finely irregular liver surface, suggesting cirrhosis. Consider hepatic elastography for further liver fibrosis risk stratification, as clinically warranted. 4. Stable 1.0 cm cystic pancreatic neck lesion. Follow-up MRI abdomen without with IV contrast recommended in 2 years. This recommendation follows ACR consensus guidelines: Management of Incidental Pancreatic Cysts: A White Paper of the ACR Incidental Findings Committee. J Am Coll Radiol  1308;65:784-696. 5. Stable 4 mm left UPJ stone.  No hydronephrosis. 6.  Aortic Atherosclerosis (ICD10-I70.0).  Coronary atherosclerosis.      05/07/2017 Surgery    Colectomy and parastomal hernia repair by [name]       05/07/2017 Pathology Results    Diagnosis 05/07/17 Colon, segmental resection for tumor, right colon and terminal ileum - INVASIVE COLORECTAL ADENOCARCINOMA, 3.3 CM. - TUMOR EXTENDS INTO PERICOLONIC CONNECTIVE TISSUE. - MARGINS NOT INVOLVED. - METASTATIC CARCINOMA IN ONE OF TWENTY-SIX LYMPH NODES (1/26).      05/20/2017 Initial Diagnosis    Cancer of right colon ([place])      05/28/2017 Procedure    Echo Study Conclusions - Left ventricle: The cavity size was normal. Wall thickness was normal. Systolic function was normal. The estimated ejection  fraction was in the range of 55% to 60%. Wall motion was normal; there were no regional wall motion abnormalities. Features are consistent with a pseudonormal left ventricular filling pattern,  with concomitant abnormal relaxation and increased filling pressure (grade 2 diastolic dysfunction). Doppler parameters are consistent with high ventricular filling pressure. - Aortic valve: Valve area (VTI): 1.64 cm^2. Valve area (Vmax): 1.55 cm^2. Valve area (Vmean): 1.65 cm^2. - Mitral valve: Moderately calcified annulus. Normal thickness leaflets . There was mild regurgitation. - Left atrium: The atrium was severely dilated. - Atrial septum: No defect or patent foramen ovale was identified. - Pulmonary arteries: Systolic pressure was mildly increased. PA peak pressure: 37 mm Hg (S). - Technically adequate study.      06/05/2017 Imaging    CT CHEST W CONTRAST IMPRESSION: -No evidence of metastatic disease or other acute findings within the thorax. -Aortic Atherosclerosis (ICD10-I70.0) and Emphysema (ICD10-J43.9). -Coronary artery calcification. -Large hiatal hernia.       06/05/2017  Imaging    CT Chest WO Contrast  06/05/17 IMPRESSION: No evidence of metastatic disease or other acute findings within the thorax.        HISTORY OF PRESENTING ILLNESS:  [name] 83 y.o. female is a here because of newly diagnosed invasive colorectal adenocarcinoma. The patient was referred by [name]. The patient presents to the clinic today accompanied by her daughters.  Prior to cancer discovery, she reports a polyp was discovered in her colon 2 years ago and it was partially removed. She notes she did not have any symptoms. No abdominal pain, boating, change in bowel habits, or blood in stool.   Pt had a colonoscopy on 02/06/17 that revealed villous adenoma with multifocal high-grade dysplasia (multiple fragments), arising in a background of traditional serrated adenoma. Pt underwent a colectomy and parastomal hernia repair on 05/07/17 with [name]. Pathology results revealed invasive colorectal adenocarcinoma. There was a 3.3 cm tumor that extends into pericolonic connective tissue. 1/26 lymph nodes biopsied contained metastatic carcinoma.   Today the patient notes she is recovering from surgery. She is taking Ibuprofen occasionally for some pain around her incision.   Socially she is retired and lives in [place] alone.    In the past the patient had a previous surgical history of hiatal hernia repair, cholecystectomy, partial colectomy with colostomy creation/hartmann procedure, rotator cuff surgery, cataract surgery, coronary stent intervention and tubal ligation.   She also has been diagnosed with significant comorbidities of HTN, Hypothyroidism, CHF and she has a Hx of melanoma. She sees are cardiologist and nephrologist regularly and she takes Lasix. She states her HTN is controlled. She has no FHx of CA.   She endorses tobacco use and that she quit in 1990.   CURRENT THERAPY: Observation   INTERVAL HISTORY:  Dailee I Faraci is here for follow for her colon cancer. She presents to the clinic today by  herself. She reports she did not tolerate adjuvant Xeloda and she stopped. She felt sick and could not get out of bed. She states she has fully recovered since stopping the Xeloda. She reports that she always has abdominal symptoms such has nausea and constipation that she tolerates.   On review of systems, pt denies new pain, chest pain, or any other complaints at this time. Pertinent positives are listed and detailed within the above HPI.   MEDICAL HISTORY:  Past Medical History:  Diagnosis Date  . Anemia due to blood loss, chronic   . Anxiety state, unspecified   . Atrophy of left kidney   . CKD (chronic kidney disease), stage III ([place])   . Coronary artery disease cardiologist-  dr Bronson Ing   a. Moderate to severe coronary disease involving the left anterior descending artery and right coronary artery.  Sequential stenosis in the LAD is significant.  Right coronary artery is moderate to severely likely nonischemic. Questionable small LVOT obstruction.  No Brockenbrough maneuver was performed.  Catheterization January 2013 w/ PTCA and DES x1 to pLAD b. 05/2014 MV no isch/infarct, EF 60%.  . Depressive disorder, not elsewhere classified   . Diastolic CHF, chronic [facility])    cardiologist-  dr Bronson Ing  . GERD (gastroesophageal reflux disease)   . H/O hiatal hernia   . History of [name]'s esophagus   . History of melanoma excision    back  . Hypertension   . Hypothyroidism   . Macular degeneration    both eyes  . PAF (paroxysmal atrial fibrillation) ([place])   .  PONV (postoperative nausea and vomiting)   . Pulmonary hypertension ([place])    PA systolic [id number] mmHg by echocardiogram 06-17-2015  PA pressure 05-08-2011 by cardiac catheterization -- no sig. pulm. htn)PA saturation 62% thermodilution cardiac index 2.0 thick cardiac index 2.4  . Pure hypercholesterolemia   . S/P drug eluting coronary stent placement 05/08/2011   x1 DES to proximal LAD  . Wears dentures    lower  . Wears  glasses     SURGICAL HISTORY: Past Surgical History:  Procedure Laterality Date  . CARDIAC CATHETERIZATION Left 03/05/2015   Procedure: CENTRAL LINE INSERTION;  Surgeon: Aviva Signs, MD;  Location: [facility];  Service: General;  Laterality: Left;  . CATARACT EXTRACTION W/ INTRAOCULAR LENS  IMPLANT, BILATERAL  ~ 2002  . CHOLECYSTECTOMY N/A 07/18/2016   Procedure: LAPAROSCOPIC CHOLECYSTECTOMY WITH INTRAOPERATIVE CHOLANGIOGRAM;  Surgeon: [name], MD;  Location: [facility];  Service: General;  Laterality: N/A;  . COLECTOMY WITH COLOSTOMY CREATION/HARTMANN PROCEDURE N/A 03/05/2015   Procedure: PARTIAL COLECTOMY WITH COLOSTOMY CREATION/HARTMANN PROCEDURE;  Surgeon: Aviva Signs, MD;  Location: [facility];  Service: General;  Laterality: N/A;  . ESOPHAGEAL MANOMETRY N/A 06/06/2014   Procedure: ESOPHAGEAL MANOMETRY (EM);  Surgeon: Jerene Bears, MD;  Location: [facility];  Service: Gastroenterology;  Laterality: N/A;  . ESOPHAGOGASTRODUODENOSCOPY (EGD) WITH PROPOFOL N/A 08/04/2015   Procedure: ESOPHAGOGASTRODUODENOSCOPY (EGD) WITH PROPOFOL;  Surgeon: Mauri Pole, MD;  Location: [facility];  Service: Endoscopy;  Laterality: N/A;  . HERNIA REPAIR    . IR GENERIC HISTORICAL  03/04/2016   IR LUMBAR DISC ASPIRATION W/IMG GUIDE 03/04/2016 Luanne Bras, MD [facility]-INTERV RAD  . LAPAROSCOPIC NISSEN FUNDOPLICATION N/A 5/72/6203   Procedure: LAPAROSCOPIC  LYSIS OF ADHESIONS, SEGMENTAL GASTRECTOMY, PARTIAL REDUCTION OF HERNIA;  Surgeon: [name], MD;  Location: [facility];  Service: General;  Laterality: N/A;  . LAPAROSCOPIC PARTIAL COLECTOMY N/A 05/07/2017   Procedure: LAPAROSCOPIC  CONVERTED TO OPEN RIGHT COLECTOMY, PARASTOMAL HERNIA  REPAIR;  Surgeon: Leighton Ruff, MD;  Location: [facility];  Service: General;  Laterality: N/A;  . NISSEN FUNDOPLICATION  5597  . PERCUTANEOUS CORONARY STENT INTERVENTION (PCI-S) N/A 05/08/2011   Procedure: PERCUTANEOUS CORONARY STENT INTERVENTION (PCI-S);  Surgeon: Wellington Hampshire, MD;  Location: [facility] CATH LAB;  Service: Cardiovascular;  Laterality: N/A;  . ROTATOR CUFF REPAIR  ~ 2001   right  . SHOULDER ARTHROSCOPY  ~ 2004; 2005   left; "joint's wore out"  . TONSILLECTOMY  ~ 1944  . TUBAL LIGATION  1972    SOCIAL HISTORY: Social History   Socioeconomic History  . Marital status: Divorced    Spouse name: Not on file  . Number of children: 3  . Years of education: Not on file  . Highest education level: Not on file  Occupational History  . Occupation: Retired  Scientific laboratory technician  . Financial resource strain: Not on file  . Food insecurity:    Worry: Not on file    Inability: Not on file  . Transportation needs:    Medical: Not on file    Non-medical: Not on file  Tobacco Use  . Smoking status: Former Smoker    Packs/day: 1.00    Years: 20.00    Pack years: 20.00    Types: Cigarettes    Start date: 10/12/1958    Last attempt to quit: 04/26/1988    Years since quitting: 29.2  . Smokeless tobacco: Never Used  Substance and Sexual Activity  . Alcohol use: No    Alcohol/week: 0.0 oz  .  Drug use: No  . Sexual activity: Never  Lifestyle  . Physical activity:    Days per week: Not on file    Minutes per session: Not on file  . Stress: Not on file  Relationships  . Social connections:    Talks on phone: Not on file    Gets together: Not on file    Attends religious service: Not on file    Active member of club or organization: Not on file    Attends meetings of clubs or organizations: Not on file    Relationship status: Not on file  . Intimate partner violence:    Fear of current or ex partner: Not on file    Emotionally abused: Not on file    Physically abused: Not on file    Forced sexual activity: Not on file  Other Topics Concern  . Not on file  Social History Narrative   Divorced.  Lives alone.  Ambulates with a walker.     FAMILY HISTORY: Family History  Problem Relation Age of Onset  . Heart attack Father   . Hypertension Father    . Hypertension Mother   . Skin cancer Brother        melanoma  . Atrial fibrillation Sister   . Skin cancer Sister   . Colon cancer Neg Hx   . Esophageal cancer Neg Hx   . Rectal cancer Neg Hx   . Stomach cancer Neg Hx     ALLERGIES:  is allergic to sulfamethoxazole.  MEDICATIONS:  Current Outpatient Medications  Medication Sig Dispense Refill  . albuterol (PROVENTIL HFA;VENTOLIN HFA) 108 (90 Base) MCG/ACT inhaler Inhale 2 puffs into the lungs every 6 (six) hours as needed for wheezing or shortness of breath.     . ALPRAZolam (XANAX) 0.25 MG tablet TAKE 1/2 TO 1 TABLET 3 TIMES DAILY AS NEEDED 270 tablet 1  . amLODipine (NORVASC) 5 MG tablet TAKE ONE (1) TABLET EACH DAY (Patient taking differently: Take 5 mgs by mouth once daily at night) 90 tablet 1  . citalopram (CELEXA) 40 MG tablet TAKE ONE (1) TABLET EACH DAY (Patient taking differently: TAKE ONE (1) TABLET EACH DAY AT NIGHT) 90 tablet 3  . esomeprazole (NEXIUM) 40 MG capsule TAKE ONE (1) CAPSULE EACH DAY (Patient taking differently: TAKE ONE (1) CAPSULE EACH DAY--- takes in am) 90 capsule 1  . ferrous sulfate 325 (65 FE) MG EC tablet Take 325 mg by mouth daily with breakfast.     . fluticasone (FLONASE) 50 MCG/ACT nasal spray USE 2 SPRAYS IN EACH NOSTRIL DAILY (Patient taking differently: USE 2 SPRAYS IN EACH NOSTRIL DAILY AS NEEDED FOR CONGESTION) 48 g 1  . furosemide (LASIX) 20 MG tablet Take 1 tablet (20 mg total) by mouth as needed.    . hydrALAZINE (APRESOLINE) 50 MG tablet TAKE ONE (1) TABLET THREE (3) TIMES EACH DAY 270 tablet 1  . ibuprofen (ADVIL,MOTRIN) 200 MG tablet Take 200-400 mg by mouth daily as needed for headache or moderate pain.    . isosorbide mononitrate (IMDUR) 30 MG 24 hr tablet TAKE ONE TABLET EVERY MORNING 90 tablet 1  . levothyroxine (SYNTHROID, LEVOTHROID) 50 MCG tablet Take 1 tablet (50 mcg total) by mouth daily. (Patient taking differently: Take 50 mcg by mouth daily before breakfast. ) 90 tablet 0  .  loratadine (CLARITIN) 10 MG tablet Take 1 tablet (10 mg total) by mouth daily. (Patient taking differently: Take 10 mg by mouth at bedtime. ) 90 tablet  3  . losartan (COZAAR) 100 MG tablet Take 0.5 tablets (50 mg total) by mouth daily. (Patient taking differently: Take 50 mg by mouth every morning. ) 90 tablet 1  . metoprolol succinate (TOPROL-XL) 25 MG 24 hr tablet TAKE ONE TABLET BY MOUTH TWICE DAILY (Patient taking differently: Take 25 mg by mouth once daily at night) 180 tablet 1  . montelukast (SINGULAIR) 10 MG tablet Take 1 tablet (10 mg total) by mouth at bedtime. 90 tablet 3  . nitroGLYCERIN (NITROSTAT) 0.4 MG SL tablet Place 1 tablet (0.4 mg total) under the tongue every 5 (five) minutes as needed for chest pain. Reported on 04/26/2015 25 tablet 3  . ondansetron (ZOFRAN) 8 MG tablet Take 1 tablet (8 mg total) by mouth every 8 (eight) hours as needed for nausea or vomiting. 20 tablet 0  . polyethylene glycol powder (GLYCOLAX/MIRALAX) powder MIX 17 GRAMS IN 80Z OF WATER/JUICE AND DRINK TWICE DAILY (Patient taking differently: every other day. MIX 17 GRAMS IN 80Z OF WATER/JUICE AND DRINK ONCE DAILY) 1054 g 2  . potassium chloride (K-DUR) 10 MEQ tablet TAKE ONE (1) TABLET EACH DAY (Patient taking differently: TAKE ONE (1) TABLET EACH DAY--- takes in am) 90 tablet 1  . Propylene Glycol (SYSTANE BALANCE OP) Apply 1 drop to eye daily as needed (dry eyes).    . rosuvastatin (CRESTOR) 10 MG tablet TAKE ONE (1) TABLET EACH DAY (Patient taking differently: Take 10 mgs by mouth daily at night) 90 tablet 1  . traMADol (ULTRAM) 50 MG tablet Take 1 tablet (50 mg total) by mouth every 12 (twelve) hours as needed. 20 tablet 1  . traZODone (DESYREL) 100 MG tablet TAKE ONE TABLET DAILY AT BEDTIME 90 tablet 1  . Vitamin D, Ergocalciferol, (DRISDOL) 50000 units CAPS capsule TAKE 1 CAPSULE EVERY WEEK ON TUESDAYS (Patient taking differently: Take 50,000 Units by mouth every 14 (fourteen) days. ) 12 capsule 3  . XELODA  500 MG tablet Take 3 tablets ('1500mg'$ ) by mouth 2 times daily, within 30 min of finishing food. Take for 14 days on, 7 days off, repeat every 21 days 84 tablet 2  . zolpidem (AMBIEN) 10 MG tablet TAKE ONE (1) TABLET AT BEDTIME 90 tablet 0   No current facility-administered medications for this visit.     REVIEW OF SYSTEMS:   Constitutional: Denies fevers, chills or abnormal night sweats Eyes: Denies blurriness of vision, double vision or watery eyes Ears, nose, mouth, throat, and face: Denies mucositis or sore throat Respiratory: Denies cough, dyspnea or wheezes Cardiovascular: Denies palpitation, chest discomfort or lower extremity swelling Gastrointestinal:  Denies heartburn (+) colostomy bag (+) nausea (+) constipation  Skin: Denies abnormal skin rashes Lymphatics: Denies new lymphadenopathy or easy bruising Neurological:Denies numbness, tingling or new weaknesses Behavioral/Psych: Mood is stable, no new changes  All other systems were reviewed with the patient and are negative.  PHYSICAL EXAMINATION: ECOG PERFORMANCE STATUS: 1 - Symptomatic but completely ambulatory  Vitals:   07/07/17 0820  BP: (!) 174/68  Pulse: (!) 59  Resp: 20  Temp: 97.9 F (36.6 C)  SpO2: 96%   Filed Weights   07/07/17 0820  Weight: 146 lb 11.2 oz (66.5 kg)    GENERAL:alert, no distress and comfortable SKIN: skin color, texture, turgor are normal, no rashes or significant lesions EYES: normal, conjunctiva are pink and non-injected, sclera clear OROPHARYNX:no exudate, no erythema and lips, buccal mucosa, and tongue normal  NECK: supple, thyroid normal size, non-tender, without nodularity LYMPH:  no palpable  lymphadenopathy in the cervical, axillary or inguinal LUNGS: clear to auscultation and percussion with normal breathing effort HEART: regular rate & rhythm, (+) 2/6 systolic murmur  ABDOMEN:abdomen soft, non-tender and normal bowel sounds (+) Midline incision healing well  (+) colostomy bag on  left  Musculoskeletal:no cyanosis of digits and no clubbing PSYCH: alert & oriented x 3 with fluent speech NEURO: no focal motor/sensory deficits  LABORATORY DATA:  I have reviewed the data as listed CBC Latest Ref Rng & Units 07/07/2017 06/25/2017 06/12/2017  WBC 3.9 - 10.3 K/uL 6.6 4.8 5.0  Hemoglobin 12.0 - 15.0 g/dL - - -  Hematocrit 34.8 - 46.6 % 33.6(L) 35.4 34.0(L)  Platelets 145 - 400 K/uL 199 171 166    CMP Latest Ref Rng & Units 07/07/2017 06/25/2017 06/12/2017  Glucose 70 - 140 mg/dL 99 89 98  BUN 7 - 26 mg/dL _0 Creatinine 0.60 - 1.10 mg/dL 1.48(H) 1.43(H) 1.46(H)  Sodium 136 - 145 mmol/L 142 142 142  Potassium 3.5 - 5.1 mmol/L 4.1 3.8 4.0  Chloride 98 - 109 mmol/L 111(H) 108 107  CO2 22 - 29 mmol/L _1 Calcium 8.4 - 10.4 mg/dL 9.4 9.6 9.3  Total Protein 6.4 - 8.3 g/dL 6.4 6.6 6.4  Total Bilirubin 0.2 - 1.2 mg/dL 0.3 0.5 0.2  Alkaline Phos 40 - 150 U/L 69 67 60  AST 5 - 34 U/L _2 ALT 0 - 55 U/L 11 6 <6   PROCEDURE  Echo 05/28/17 Study Conclusions - Left ventricle: The cavity size was normal. Wall thickness was normal. Systolic function was normal. The estimated ejection  fraction was in the range of 55% to 60%. Wall motion was normal; there were no regional wall motion abnormalities. Features are consistent with a pseudonormal left ventricular filling pattern,  with concomitant abnormal relaxation and increased filling pressure (grade 2 diastolic dysfunction). Doppler parameters are consistent with high ventricular filling pressure. - Aortic valve: Valve area (VTI): 1.64 cm^2. Valve area (Vmax): 1.55 cm^2. Valve area (Vmean): 1.65 cm^2. - Mitral valve: Moderately calcified annulus. Normal thickness leaflets . There was mild regurgitation. - Left atrium: The atrium was severely dilated. - Atrial septum: No defect or patent foramen ovale was identified. - Pulmonary arteries: Systolic pressure was mildly increased. PA peak pressure: 37 mm Hg (S). -  Technically adequate study.  Colonoscopy by Dr. Stephanie Acre [facility] 02/06/17 IMPRESSION: Colon, right, biopsy - Villous adenoma with multifocal high-grade dysplasia (multiple fragments), arising in a background of traditional serrated adenoma, see comment. See Comment Comment -There is a vigorous stromal reaction, possibly reaction to previous biopsy. While no definitive invasion is identified, the specimen appears small in comparison to the clinically described lesion and may not be representative.  PATHOLOGY Diagnosis 05/07/17 Colon, segmental resection for tumor, right colon and terminal ileum - INVASIVE COLORECTAL ADENOCARCINOMA, 3.3 CM. - TUMOR EXTENDS INTO PERICOLONIC CONNECTIVE TISSUE. - MARGINS NOT INVOLVED. - METASTATIC CARCINOMA IN ONE OF TWENTY-SIX LYMPH NODES (1/26). Microscopic Comment COLON AND RECTUM (INCLUDING TRANS-ANAL RESECTION): Specimen: Right colon and terminal ileum. Procedure: Resection. Tumor site: Distal ascending colon. Specimen integrity: Intact. Macroscopic intactness of mesorectum: Not applicable. Macroscopic tumor perforation: No. Invasive tumor: Maximum size: 3.3 cm. Histologic type(s): Colorectal adenocarcinoma. Histologic grade and differentiation: G2: moderately differentiated/low grade Type of polyp in which invasive carcinoma arose: Tubulovillous adenoma. Microscopic extension of invasive tumor: Into pericolonic connective tissue. Lymph-Vascular invasion: Present. Peri-neural invasion: No. Tumor deposit(s) (discontinuous extramural extension): No. Resection  margins: Proximal margin: Free of tumor. Distal margin: Free of tumor. Circumferential (radial) (posterior ascending, posterior descending; lateral and posterior mid-rectum; and entire lower 1/3 rectum): Free of tumor. Mesenteric margin (sigmoid and transverse): N/A. Distance closest margin (if all above margins negative): 0.3 cm from circumferential radial margin. Treatment effect  (neo-adjuvant therapy): No. 1 of 5 Supplemental copy SUPPLEMENTAL for Salemi, Jeana I ([id number]) Microscopic Comment(continued) Additional polyp(s): Four tubular adenomas. Non-neoplastic findings: Benign appendix. Lymph nodes: number examined 26; number positive: 1. Pathologic Staging: pT3, pN1a, pMX. Ancillary studies: Microsatellite instability by PCR and mismatch repair protein by IHC. (JDP:ah 05/09/17)   RADIOGRAPHIC STUDIES: I have personally reviewed the radiological images as listed and agreed with the findings in the report. No results found.  CT Chest WO Contrast 06/05/17 IMPRESSION: No evidence of metastatic disease or other acute findings within the thorax.  CT AP W Contrast 03/13/17 IMPRESSION: 1. Stable moderate to large parastomal hernia containing multiple small bowel loops in the ventral left abdominal wall at the end colostomy site. No evidence of acute bowel complication. 2. Large hiatal hernia. 3. Finely irregular liver surface, suggesting cirrhosis. Consider hepatic elastography for further liver fibrosis risk stratification, as clinically warranted. 4. Stable 1.0 cm cystic pancreatic neck lesion. Follow-up MRI abdomen without with IV contrast recommended in 2 years. This recommendation follows ACR consensus guidelines: Management of Incidental Pancreatic Cysts: A White Paper of the ACR Incidental Findings Committee. J Am Coll Radiol 8338;25:053-976. 5. Stable 4 mm left UPJ stone.  No hydronephrosis. 6.  Aortic Atherosclerosis (ICD10-I70.0).  Coronary atherosclerosis.  ASSESSMENT & PLAN:  [name] 83 y.o. female is a here because of recently diagnosed invasive colorectal adenocarcinoma.   1. Right colon adenocarcinoma Stage IIIB (pT3, pN1A, cM0), MSI-stable  -I previously reviewed her CT scan findings, and surgical pathology results in great details with patient and her family members -She had a complete surgical resection with [name] on 05/07/17 and  pathology results reveal a T3 lesion and 1/26 lymph nodes positive for metastatic carcinoma.  Margins were negative. -She had a CT AP in Dec, 2018 that showed no sign of metastasis. I ordered a CT Chest that showed no signs of lung metastases (March 2019).  -We previously discussed the risk of cancer recurrence after surgery due to her stage IIIB disease she is at moderate to high risk of recurrence and the role of adjuvant chemo  -She began adjuvant Xeloda 1500 BID on 06/26/17. She discontinued herself after 3 doses, she could not tolerate due to nausea and abdominal discomfort. She has fully recovered today. She understands the risk of recurrence without adjuvant chemotherapy but she prefers her quality of life which is fine. We will closely monitor her with a scan every 6-12 months and follow up visits with lab and exams every 3-4 months for this 1st year. I advised her to let us know if she has any new symptoms. After 2 years we will see her every 6 months.  -I will order a CT AT [facility] next visit to be done in 6 months  -Labs reviewed, she is mildly anemic with Hgb 10.8. She will continue oral iron. CMP is pending  -F/u in 3 months   2. HTN, CAD, diastolic CHF, AF, CKD stage III -Continue follow-up with her primary care physician and her cardiologist -We previously discussed the impact of chemotherapy on her blood pressure.  I may need to hold or reduce her Lasix during her chemo if she has worsening  renal function. -Will monitor her blood pressure closely during treatment -I will ask her cardiologist to see her before chemo and repeat her echo. -her Echo from 05/28/17 had a LV EF of 55-60%  3. Iron deficient anemia -Iron study otherwise normal, mildly low Hgb, 10.5-11.5 range  -continue oral iron   PLAN: -Discontinued Xeloda due to poor tolerance  -Lab and F/u in 3 months -CT AP in 6 months, will order next visit   No orders of the defined types were placed in this encounter.   All  questions were answered. The patient knows to call the clinic with any problems, questions or concerns. I spent 20 minutes counseling the patient face to face. The total time spent in the appointment was 25 minutes and more than 50% was on counseling.  This document serves as a record of services personally performed by Truitt Merle, MD. It was created on her behalf by Theresia Bough, a trained medical scribe. The creation of this record is based on the scribe's personal observations and the provider's statements to them.   I have reviewed the above documentation for accuracy and completeness, and I agree with the above.    Truitt Merle, MD 07/07/2017 11:24 AM

## 2017-07-07 ENCOUNTER — Inpatient Hospital Stay: Payer: Medicare Other | Attending: Hematology

## 2017-07-07 ENCOUNTER — Encounter: Payer: Self-pay | Admitting: Hematology

## 2017-07-07 ENCOUNTER — Telehealth: Payer: Self-pay | Admitting: Hematology

## 2017-07-07 ENCOUNTER — Inpatient Hospital Stay (HOSPITAL_BASED_OUTPATIENT_CLINIC_OR_DEPARTMENT_OTHER): Payer: Medicare Other | Admitting: Hematology

## 2017-07-07 VITALS — BP 174/68 | HR 59 | Temp 97.9°F | Resp 20 | Ht 62.5 in | Wt 146.7 lb

## 2017-07-07 DIAGNOSIS — Z79899 Other long term (current) drug therapy: Secondary | ICD-10-CM | POA: Diagnosis not present

## 2017-07-07 DIAGNOSIS — Z87891 Personal history of nicotine dependence: Secondary | ICD-10-CM | POA: Insufficient documentation

## 2017-07-07 DIAGNOSIS — E7800 Pure hypercholesterolemia, unspecified: Secondary | ICD-10-CM | POA: Diagnosis not present

## 2017-07-07 DIAGNOSIS — H3530 Unspecified macular degeneration: Secondary | ICD-10-CM | POA: Diagnosis not present

## 2017-07-07 DIAGNOSIS — E039 Hypothyroidism, unspecified: Secondary | ICD-10-CM | POA: Insufficient documentation

## 2017-07-07 DIAGNOSIS — D509 Iron deficiency anemia, unspecified: Secondary | ICD-10-CM

## 2017-07-07 DIAGNOSIS — D500 Iron deficiency anemia secondary to blood loss (chronic): Secondary | ICD-10-CM

## 2017-07-07 DIAGNOSIS — I2510 Atherosclerotic heart disease of native coronary artery without angina pectoris: Secondary | ICD-10-CM | POA: Insufficient documentation

## 2017-07-07 DIAGNOSIS — C182 Malignant neoplasm of ascending colon: Secondary | ICD-10-CM

## 2017-07-07 DIAGNOSIS — N183 Chronic kidney disease, stage 3 (moderate): Secondary | ICD-10-CM | POA: Insufficient documentation

## 2017-07-07 DIAGNOSIS — Z955 Presence of coronary angioplasty implant and graft: Secondary | ICD-10-CM | POA: Insufficient documentation

## 2017-07-07 DIAGNOSIS — I130 Hypertensive heart and chronic kidney disease with heart failure and stage 1 through stage 4 chronic kidney disease, or unspecified chronic kidney disease: Secondary | ICD-10-CM | POA: Insufficient documentation

## 2017-07-07 DIAGNOSIS — K219 Gastro-esophageal reflux disease without esophagitis: Secondary | ICD-10-CM | POA: Insufficient documentation

## 2017-07-07 DIAGNOSIS — C779 Secondary and unspecified malignant neoplasm of lymph node, unspecified: Secondary | ICD-10-CM | POA: Diagnosis not present

## 2017-07-07 DIAGNOSIS — I480 Paroxysmal atrial fibrillation: Secondary | ICD-10-CM | POA: Insufficient documentation

## 2017-07-07 DIAGNOSIS — I700 Atherosclerosis of aorta: Secondary | ICD-10-CM | POA: Diagnosis not present

## 2017-07-07 DIAGNOSIS — Z85820 Personal history of malignant melanoma of skin: Secondary | ICD-10-CM | POA: Diagnosis not present

## 2017-07-07 LAB — CMP (CANCER CENTER ONLY)
ALBUMIN: 3.6 g/dL (ref 3.5–5.0)
ALK PHOS: 69 U/L (ref 40–150)
ALT: 11 U/L (ref 0–55)
ANION GAP: 7 (ref 3–11)
AST: 14 U/L (ref 5–34)
BUN: 16 mg/dL (ref 7–26)
CALCIUM: 9.4 mg/dL (ref 8.4–10.4)
CO2: 24 mmol/L (ref 22–29)
Chloride: 111 mmol/L — ABNORMAL HIGH (ref 98–109)
Creatinine: 1.48 mg/dL — ABNORMAL HIGH (ref 0.60–1.10)
GFR, EST AFRICAN AMERICAN: 37 mL/min — AB (ref >60)
GFR, Estimated: 32 mL/min — ABNORMAL LOW (ref >60)
GLUCOSE: 99 mg/dL (ref 70–140)
Potassium: 4.1 mmol/L (ref 3.5–5.1)
Sodium: 142 mmol/L (ref 136–145)
TOTAL PROTEIN: 6.4 g/dL (ref 6.4–8.3)
Total Bilirubin: 0.3 mg/dL (ref 0.2–1.2)

## 2017-07-07 LAB — CBC WITH DIFFERENTIAL (CANCER CENTER ONLY)
Basophils Absolute: 0.1 10*3/uL (ref 0.0–0.1)
Basophils Relative: 1 %
Eosinophils Absolute: 0.2 10*3/uL (ref 0.0–0.5)
Eosinophils Relative: 4 %
HCT: 33.6 % — ABNORMAL LOW (ref 34.8–46.6)
HEMOGLOBIN: 10.8 g/dL — AB (ref 11.6–15.9)
LYMPHS ABS: 1.5 10*3/uL (ref 0.9–3.3)
Lymphocytes Relative: 22 %
MCH: 26.6 pg (ref 25.1–34.0)
MCHC: 32.2 g/dL (ref 31.5–36.0)
MCV: 82.6 fL (ref 79.5–101.0)
MONOS PCT: 6 %
Monocytes Absolute: 0.4 10*3/uL (ref 0.1–0.9)
NEUTROS ABS: 4.4 10*3/uL (ref 1.5–6.5)
NEUTROS PCT: 67 %
Platelet Count: 199 10*3/uL (ref 145–400)
RBC: 4.07 MIL/uL (ref 3.70–5.45)
RDW: 17 % — ABNORMAL HIGH (ref 11.2–14.5)
WBC Count: 6.6 10*3/uL (ref 3.9–10.3)

## 2017-07-07 LAB — FERRITIN: Ferritin: 39 ng/mL (ref 9–269)

## 2017-07-07 NOTE — Telephone Encounter (Signed)
Appointments scheduled AVS/Calendar printed per 4/8 los

## 2017-07-14 ENCOUNTER — Ambulatory Visit: Payer: Medicare Other | Admitting: Nurse Practitioner

## 2017-07-14 ENCOUNTER — Other Ambulatory Visit: Payer: Medicare Other

## 2017-07-15 ENCOUNTER — Other Ambulatory Visit: Payer: Self-pay | Admitting: Physician Assistant

## 2017-07-15 ENCOUNTER — Ambulatory Visit: Payer: Medicare Other | Admitting: Cardiovascular Disease

## 2017-07-29 ENCOUNTER — Encounter: Payer: Self-pay | Admitting: Physician Assistant

## 2017-07-29 ENCOUNTER — Ambulatory Visit (INDEPENDENT_AMBULATORY_CARE_PROVIDER_SITE_OTHER): Payer: Medicare Other | Admitting: Physician Assistant

## 2017-07-29 VITALS — BP 160/65 | HR 47 | Ht 62.5 in | Wt 142.0 lb

## 2017-07-29 DIAGNOSIS — I10 Essential (primary) hypertension: Secondary | ICD-10-CM | POA: Diagnosis not present

## 2017-07-29 DIAGNOSIS — E039 Hypothyroidism, unspecified: Secondary | ICD-10-CM

## 2017-07-29 DIAGNOSIS — E7800 Pure hypercholesterolemia, unspecified: Secondary | ICD-10-CM

## 2017-07-29 DIAGNOSIS — C182 Malignant neoplasm of ascending colon: Secondary | ICD-10-CM | POA: Diagnosis not present

## 2017-07-29 DIAGNOSIS — N183 Chronic kidney disease, stage 3 unspecified: Secondary | ICD-10-CM

## 2017-07-29 NOTE — Patient Instructions (Signed)
In a few days you may receive a survey in the mail or online from [employer] regarding your visit with us today. Please take a moment to fill this out. Your feedback is very important to our whole office. It can help us better understand your needs as well as improve your experience and satisfaction. Thank you for taking your time to complete it. We care about you.  Shenicka Sunderlin, PA-C  

## 2017-07-29 NOTE — Progress Notes (Signed)
BP (!) 160/65   Pulse (!) 47   Ht 5' 2.5" (1.588 m)   Wt 142 lb (64.4 kg)   BMI 25.56 kg/m    Subjective:    Patient ID: [name], female    DOB: Jul 03, 1934, 83 y.o.   MRN: [id number]  HPI: [name] is a 83 y.o. female presenting on 07/29/2017 for Follow-up (3 month )  This patient comes in for a 8-monthrecheck on her conditions.  She does have stage III colon cancer.  She is not taking with the chemotherapy due to severe gastrointestinal side effects.  She did not try it.  She stated "she wants to live quality rather than quantity.  She and her family are still doing lots of things and she is feeling quite good.  She also does need refills on some of her medications.  Everything else is very stable.  Past Medical History:  Diagnosis Date  . Anemia due to blood loss, chronic   . Anxiety state, unspecified   . Atrophy of left kidney   . CKD (chronic kidney disease), stage III ([facility]   . Coronary artery disease cardiologist-  dr kBronson Ing  a. Moderate to severe coronary disease involving the left anterior descending artery and right coronary artery.  Sequential stenosis in the LAD is significant.  Right coronary artery is moderate to severely likely nonischemic. Questionable small LVOT obstruction.  No Brockenbrough maneuver was performed.  Catheterization January 2013 w/ PTCA and DES x1 to pLAD b. 05/2014 MV no isch/infarct, EF 60%.  . Depressive disorder, not elsewhere classified   . Diastolic CHF, chronic ([facility]    cardiologist-  dr kBronson Ing . GERD (gastroesophageal reflux disease)   . H/O hiatal hernia   . History of [name]'s esophagus   . History of melanoma excision    back  . Hypertension   . Hypothyroidism   . Macular degeneration    both eyes  . PAF (paroxysmal atrial fibrillation) ([facility]   . PONV (postoperative nausea and vomiting)   . Pulmonary hypertension ([facility]    PA systolic [id number] by echocardiogram 06-17-2015  PA pressure 05-08-2011 by cardiac  catheterization -- no sig. pulm. htn)PA saturation 62% thermodilution cardiac index 2.0 thick cardiac index 2.4  . Pure hypercholesterolemia   . S/P drug eluting coronary stent placement 05/08/2011   x1 DES to proximal LAD  . Wears dentures    lower  . Wears glasses    Relevant past medical, surgical, family and social history reviewed and updated as indicated. Interim medical history since our last visit reviewed. Allergies and medications reviewed and updated. DATA REVIEWED: CHART IN EPIC  Family History reviewed for pertinent findings.  Review of Systems  Constitutional: Positive for fatigue.  HENT: Negative.   Eyes: Negative.   Respiratory: Negative.   Gastrointestinal: Positive for abdominal distention, abdominal pain and diarrhea.  Genitourinary: Negative.     Allergies as of 07/29/2017      Reactions   Sulfamethoxazole Rash      Medication List        Accurate as of 07/29/17 10:48 PM. Always use your most recent med list.          albuterol 108 (90 Base) MCG/ACT inhaler Commonly known as:  PROVENTIL HFA;VENTOLIN HFA Inhale 2 puffs into the lungs every 6 (six) hours as needed for wheezing or shortness of breath.   ALPRAZolam 0.25 MG tablet Commonly known as:  XANAX TAKE 1/2 TO 1  TABLET 3 TIMES DAILY AS NEEDED   amLODipine 5 MG tablet Commonly known as:  NORVASC TAKE ONE (1) TABLET EACH DAY   citalopram 40 MG tablet Commonly known as:  CELEXA TAKE ONE (1) TABLET EACH DAY   esomeprazole 40 MG capsule Commonly known as:  NEXIUM TAKE ONE (1) CAPSULE EACH DAY   ferrous sulfate 325 (65 FE) MG EC tablet Take 325 mg by mouth daily with breakfast.   fluticasone 50 MCG/ACT nasal spray Commonly known as:  FLONASE USE 2 SPRAYS IN EACH NOSTRIL DAILY   furosemide 20 MG tablet Commonly known as:  LASIX Take 1 tablet (20 mg total) by mouth as needed.   hydrALAZINE 50 MG tablet Commonly known as:  APRESOLINE TAKE ONE (1) TABLET THREE (3) TIMES EACH DAY     ibuprofen 200 MG tablet Commonly known as:  ADVIL,MOTRIN Take 200-400 mg by mouth daily as needed for headache or moderate pain.   isosorbide mononitrate 30 MG 24 hr tablet Commonly known as:  IMDUR TAKE ONE TABLET EVERY MORNING   levothyroxine 50 MCG tablet Commonly known as:  SYNTHROID, LEVOTHROID Take 1 tablet (50 mcg total) by mouth daily before breakfast.   loratadine 10 MG tablet Commonly known as:  CLARITIN Take 1 tablet (10 mg total) by mouth daily.   losartan 100 MG tablet Commonly known as:  COZAAR Take 0.5 tablets (50 mg total) by mouth daily.   Meclizine HCl 25 MG Chew TAKE 1 TABLET 3 TIMES DAILY AS NEEDED FOR DIZZINESS   metoprolol succinate 25 MG 24 hr tablet Commonly known as:  TOPROL-XL TAKE ONE TABLET BY MOUTH TWICE DAILY   montelukast 10 MG tablet Commonly known as:  SINGULAIR TAKE ONE TABLET DAILY AT BEDTIME   nitroGLYCERIN 0.4 MG SL tablet Commonly known as:  NITROSTAT Place 1 tablet (0.4 mg total) under the tongue every 5 (five) minutes as needed for chest pain. Reported on 04/26/2015   ondansetron 8 MG tablet Commonly known as:  ZOFRAN Take 1 tablet (8 mg total) by mouth every 8 (eight) hours as needed for nausea or vomiting.   polyethylene glycol powder powder Commonly known as:  GLYCOLAX/MIRALAX MIX 17 GRAMS IN 80Z OF WATER/JUICE AND DRINK TWICE DAILY   potassium chloride 10 MEQ tablet Commonly known as:  K-DUR TAKE ONE (1) TABLET EACH DAY   rosuvastatin 10 MG tablet Commonly known as:  CRESTOR TAKE ONE (1) TABLET EACH DAY   SYSTANE BALANCE OP Apply 1 drop to eye daily as needed (dry eyes).   traMADol 50 MG tablet Commonly known as:  ULTRAM Take 1 tablet (50 mg total) by mouth every 12 (twelve) hours as needed.   traZODone 100 MG tablet Commonly known as:  DESYREL TAKE ONE TABLET DAILY AT BEDTIME   Vitamin D (Ergocalciferol) 50000 units Caps capsule Commonly known as:  DRISDOL TAKE 1 CAPSULE EVERY WEEK ON TUESDAYS   zolpidem  10 MG tablet Commonly known as:  AMBIEN TAKE ONE TABLET DAILY AT BEDTIME          Objective:    BP (!) 160/65   Pulse (!) 47   Ht 5' 2.5" (1.588 m)   Wt 142 lb (64.4 kg)   BMI 25.56 kg/m   Allergies  Allergen Reactions  . Sulfamethoxazole Rash    Wt Readings from Last 3 Encounters:  07/29/17 142 lb (64.4 kg)  07/07/17 146 lb 11.2 oz (66.5 kg)  06/13/17 144 lb (65.3 kg)    Physical Exam  Constitutional: She  is oriented to person, place, and time. She appears well-developed and well-nourished.  HENT:  Head: Normocephalic and atraumatic.  Eyes: Pupils are equal, round, and reactive to light. Conjunctivae and EOM are normal.  Cardiovascular: Normal rate, regular rhythm, normal heart sounds and intact distal pulses.  Pulmonary/Chest: Effort normal and breath sounds normal.  Abdominal: Soft. Bowel sounds are normal.  Neurological: She is alert and oriented to person, place, and time. She has normal reflexes.  Skin: Skin is warm and dry. No rash noted.  Psychiatric: She has a normal mood and affect. Her behavior is normal. Judgment and thought content normal.    Results for orders placed or performed in visit on 07/07/17  CBC with Differential (Cancer Center Only)  Result Value Ref Range   WBC Count 6.6 3.9 - 10.3 K/uL   RBC 4.07 3.70 - 5.45 MIL/uL   Hemoglobin 10.8 (L) 11.6 - 15.9 g/dL   HCT 33.6 (L) 34.8 - 46.6 %   MCV 82.6 79.5 - 101.0 fL   MCH 26.6 25.1 - 34.0 pg   MCHC 32.2 31.5 - 36.0 g/dL   RDW 17.0 (H) 11.2 - 14.5 %   Platelet Count 199 145 - 400 K/uL   Neutrophils Relative % 67 %   Neutro Abs 4.4 1.5 - 6.5 K/uL   Lymphocytes Relative 22 %   Lymphs Abs 1.5 0.9 - 3.3 K/uL   Monocytes Relative 6 %   Monocytes Absolute 0.4 0.1 - 0.9 K/uL   Eosinophils Relative 4 %   Eosinophils Absolute 0.2 0.0 - 0.5 K/uL   Basophils Relative 1 %   Basophils Absolute 0.1 0.0 - 0.1 K/uL  CMP (Cancer Center only)  Result Value Ref Range   Sodium 142 136 - 145 mmol/L    Potassium 4.1 3.5 - 5.1 mmol/L   Chloride 111 (H) 98 - 109 mmol/L   CO2 24 22 - 29 mmol/L   Glucose, Bld 99 70 - 140 mg/dL   BUN 16 7 - 26 mg/dL   Creatinine 1.48 (H) 0.60 - 1.10 mg/dL   Calcium 9.4 8.4 - 10.4 mg/dL   Total Protein 6.4 6.4 - 8.3 g/dL   Albumin 3.6 3.5 - 5.0 g/dL   AST 14 5 - 34 U/L   ALT 11 0 - 55 U/L   Alkaline Phosphatase 69 40 - 150 U/L   Total Bilirubin 0.3 0.2 - 1.2 mg/dL   GFR, Est Non Af Am 32 (L) >60 mL/min   GFR, Est AFR Am 37 (L) >60 mL/min   Anion gap 7 3 - 11  Ferritin  Result Value Ref Range   Ferritin 39 9 - 269 ng/mL      Assessment & Plan:   1. Cancer of right colon ([place])  2. CKD (chronic kidney disease) stage 3, GFR 30-59 ml/min (HCC)  3. HYPERCHOLESTEROLEMIA - Lipid panel - CMP14+EGFR  4. Essential hypertension - CMP14+EGFR  5. Hypothyroidism, unspecified type - TSH   Continue all other maintenance medications as listed above.  Follow up plan: Return in about 6 months (around 01/28/2018) for recheck.  Educational handout given for [place] PA-C [place] [address]  [place], [place] [address] [phone]   07/29/2017, 10:48 PM

## 2017-07-30 LAB — CMP14+EGFR
ALK PHOS: 62 IU/L (ref 39–117)
ALT: 7 IU/L (ref 0–32)
AST: 9 IU/L (ref 0–40)
Albumin/Globulin Ratio: 1.7 (ref 1.2–2.2)
Albumin: 3.7 g/dL (ref 3.5–4.7)
BUN/Creatinine Ratio: 11 — ABNORMAL LOW (ref 12–28)
BUN: 16 mg/dL (ref 8–27)
Bilirubin Total: <0.2 mg/dL (ref 0.0–1.2)
CALCIUM: 9.2 mg/dL (ref 8.7–10.3)
CO2: 22 mmol/L (ref 20–29)
CREATININE: 1.44 mg/dL — AB (ref 0.57–1.00)
Chloride: 107 mmol/L — ABNORMAL HIGH (ref 96–106)
GFR calc Af Amer: 40 mL/min/{1.73_m2} — ABNORMAL LOW (ref >59)
GFR, EST NON AFRICAN AMERICAN: 34 mL/min/{1.73_m2} — AB (ref >59)
Globulin, Total: 2.2 g/dL (ref 1.5–4.5)
Glucose: 93 mg/dL (ref 65–99)
POTASSIUM: 4.4 mmol/L (ref 3.5–5.2)
Sodium: 143 mmol/L (ref 134–144)
Total Protein: 5.9 g/dL — ABNORMAL LOW (ref 6.0–8.5)

## 2017-07-30 LAB — LIPID PANEL
CHOL/HDL RATIO: 2.9 ratio (ref 0.0–4.4)
Cholesterol, Total: 114 mg/dL (ref 100–199)
HDL: 39 mg/dL — ABNORMAL LOW (ref >39)
LDL Calculated: 49 mg/dL (ref 0–99)
Triglycerides: 129 mg/dL (ref 0–149)
VLDL CHOLESTEROL CAL: 26 mg/dL (ref 5–40)

## 2017-07-30 LAB — TSH: TSH: 3.31 u[IU]/mL (ref 0.450–4.500)

## 2017-09-10 ENCOUNTER — Other Ambulatory Visit: Payer: Self-pay | Admitting: Physician Assistant

## 2017-10-01 NOTE — Progress Notes (Signed)
[place]  [phone] [phone]  Clinic Follow Up Note   Patient Care Team: Terald Sleeper, PA-C as PCP - General (General Practice) Okey Regal, [place] (Optometry) Herminio Commons, MD as Attending Physician (Cardiology) Truitt Merle, MD as Consulting Physician (Hematology) Leighton Ruff, MD as Consulting Physician (General Surgery) 10/06/2017   CHIEF COMPLAINTS:  Follow up invasive colorectal adenocarcinoma  Oncology History   Cancer Staging Cancer of right colon [facility]) Staging form: Colon and Rectum, AJCC 8th Edition - Pathologic stage from 05/07/2017: Stage IIIB (pT3, pN1a, cM0) - Signed by Truitt Merle, MD on 05/20/2017       Cancer of right colon ([place])   02/06/2017 Procedure    IMPRESSION: Colon, right, biopsy - Villous adenoma with multifocal high-grade dysplasia (multiple fragments), arising in a background of traditional serrated adenoma, see comment. See Comment Comment -There is a vigorous stromal reaction, possibly reaction to previous biopsy. While no definitive invasion is identified, the specimen appears small in comparison to the clinically described lesion and may not be representative.       03/13/2017 Imaging    CT AP W Contrast 03/13/17 IMPRESSION: 1. Stable moderate to large parastomal hernia containing multiple small bowel loops in the ventral left abdominal wall at the end colostomy site. No evidence of acute bowel complication. 2. Large hiatal hernia. 3. Finely irregular liver surface, suggesting cirrhosis. Consider hepatic elastography for further liver fibrosis risk stratification, as clinically warranted. 4. Stable 1.0 cm cystic pancreatic neck lesion. Follow-up MRI abdomen without with IV contrast recommended in 2 years. This recommendation follows ACR consensus guidelines: Management of Incidental Pancreatic Cysts: A White Paper of the ACR Incidental Findings Committee. J Am Coll Radiol  2992;42:[id number]. 5. Stable 4 mm left UPJ stone.  No hydronephrosis. 6.  Aortic Atherosclerosis (ICD10-I70.0).  Coronary atherosclerosis.      05/07/2017 Surgery    Colectomy and parastomal hernia repair by [name]       05/07/2017 Pathology Results    Diagnosis 05/07/17 Colon, segmental resection for tumor, right colon and terminal ileum - INVASIVE COLORECTAL ADENOCARCINOMA, 3.3 CM. - TUMOR EXTENDS INTO PERICOLONIC CONNECTIVE TISSUE. - MARGINS NOT INVOLVED. - METASTATIC CARCINOMA IN ONE OF TWENTY-SIX LYMPH NODES (1/26).      05/20/2017 Initial Diagnosis    Cancer of right colon ([place])      05/28/2017 Procedure    Echo Study Conclusions - Left ventricle: The cavity size was normal. Wall thickness was normal. Systolic function was normal. The estimated ejection  fraction was in the range of 55% to 60%. Wall motion was normal; there were no regional wall motion abnormalities. Features are consistent with a pseudonormal left ventricular filling pattern,  with concomitant abnormal relaxation and increased filling pressure (grade 2 diastolic dysfunction). Doppler parameters are consistent with high ventricular filling pressure. - Aortic valve: Valve area (VTI): 1.64 cm^2. Valve area (Vmax): 1.55 cm^2. Valve area (Vmean): 1.65 cm^2. - Mitral valve: Moderately calcified annulus. Normal thickness leaflets . There was mild regurgitation. - Left atrium: The atrium was severely dilated. - Atrial septum: No defect or patent foramen ovale was identified. - Pulmonary arteries: Systolic pressure was mildly increased. PA peak pressure: 37 mm Hg (S). - Technically adequate study.      06/05/2017 Imaging    CT CHEST W CONTRAST IMPRESSION: -No evidence of metastatic disease or other acute findings within the thorax. -Aortic Atherosclerosis (ICD10-I70.0) and Emphysema (ICD10-J43.9). -Coronary artery calcification. -Large hiatal hernia.       06/05/2017  Imaging    CT Chest WO Contrast  06/05/17 IMPRESSION: No evidence of metastatic disease or other acute findings within the thorax.        HISTORY OF PRESENTING ILLNESS:  [name] 83 y.o. female is a here because of newly diagnosed invasive colorectal adenocarcinoma. The patient was referred by [name]. The patient presents to the clinic today accompanied by her daughters.  Prior to cancer discovery, she reports a polyp was discovered in her colon 2 years ago and it was partially removed. She notes she did not have any symptoms. No abdominal pain, boating, change in bowel habits, or blood in stool.   Pt had a colonoscopy on 02/06/17 that revealed villous adenoma with multifocal high-grade dysplasia (multiple fragments), arising in a background of traditional serrated adenoma. Pt underwent a colectomy and parastomal hernia repair on 05/07/17 with [name]. Pathology results revealed invasive colorectal adenocarcinoma. There was a 3.3 cm tumor that extends into pericolonic connective tissue. 1/26 lymph nodes biopsied contained metastatic carcinoma.   Today the patient notes she is recovering from surgery. She is taking Ibuprofen occasionally for some pain around her incision.   Socially she is retired and lives in [place] alone.    In the past the patient had a previous surgical history of hiatal hernia repair, cholecystectomy, partial colectomy with colostomy creation/hartmann procedure, rotator cuff surgery, cataract surgery, coronary stent intervention and tubal ligation.   She also has been diagnosed with significant comorbidities of HTN, Hypothyroidism, CHF and she has a Hx of melanoma. She sees are cardiologist and nephrologist regularly and she takes Lasix. She states her HTN is controlled. She has no FHx of CA.   She endorses tobacco use and that she quit in 1990.   CURRENT THERAPY: Observation   INTERVAL HISTORY:  [name] is a 83 y.o. female who is here for follow for her colon cancer. She presents  to the clinic today by herself. She feels good today. She doesn'thave major problems but states that she sometimes feels fatigue and constipated. She uses Maralax  Good appetite. She reports occasional nausea. She measures her BP at home an reports compliance to medications. Her BP is still high today at 162/66. She takes her iron pills regularly. She changes her colostomy bag 3x a week and sees her surgeon regularly. She was informed by her surgeon that a reversal wont be done.   MEDICAL HISTORY:  Past Medical History:  Diagnosis Date  . Anemia due to blood loss, chronic   . Anxiety state, unspecified   . Atrophy of left kidney   . CKD (chronic kidney disease), stage III ([place])   . Coronary artery disease cardiologist-  dr Bronson Ing   a. Moderate to severe coronary disease involving the left anterior descending artery and right coronary artery.  Sequential stenosis in the LAD is significant.  Right coronary artery is moderate to severely likely nonischemic. Questionable small LVOT obstruction.  No Brockenbrough maneuver was performed.  Catheterization January 2013 w/ PTCA and DES x1 to pLAD b. 05/2014 MV no isch/infarct, EF 60%.  . Depressive disorder, not elsewhere classified   . Diastolic CHF, chronic [facility])    cardiologist-  dr Bronson Ing  . GERD (gastroesophageal reflux disease)   . H/O hiatal hernia   . History of [name]'s esophagus   . History of melanoma excision    back  . Hypertension   . Hypothyroidism   . Macular degeneration    both eyes  . PAF (  paroxysmal atrial fibrillation) ([place])   . PONV (postoperative nausea and vomiting)   . Pulmonary hypertension ([place])    PA systolic [id number] mmHg by echocardiogram 06-17-2015  PA pressure 05-08-2011 by cardiac catheterization -- no sig. pulm. htn)PA saturation 62% thermodilution cardiac index 2.0 thick cardiac index 2.4  . Pure hypercholesterolemia   . S/P drug eluting coronary stent placement 05/08/2011   x1 DES to proximal LAD  .  Wears dentures    lower  . Wears glasses     SURGICAL HISTORY: Past Surgical History:  Procedure Laterality Date  . CARDIAC CATHETERIZATION Left 03/05/2015   Procedure: CENTRAL LINE INSERTION;  Surgeon: Aviva Signs, MD;  Location: [facility];  Service: General;  Laterality: Left;  . CATARACT EXTRACTION W/ INTRAOCULAR LENS  IMPLANT, BILATERAL  ~ 2002  . CHOLECYSTECTOMY N/A 07/18/2016   Procedure: LAPAROSCOPIC CHOLECYSTECTOMY WITH INTRAOPERATIVE CHOLANGIOGRAM;  Surgeon: [name], MD;  Location: [facility];  Service: General;  Laterality: N/A;  . COLECTOMY WITH COLOSTOMY CREATION/HARTMANN PROCEDURE N/A 03/05/2015   Procedure: PARTIAL COLECTOMY WITH COLOSTOMY CREATION/HARTMANN PROCEDURE;  Surgeon: Aviva Signs, MD;  Location: [facility];  Service: General;  Laterality: N/A;  . ESOPHAGEAL MANOMETRY N/A 06/06/2014   Procedure: ESOPHAGEAL MANOMETRY (EM);  Surgeon: Jerene Bears, MD;  Location: [facility]OS[facility];  Service: Gastroenterology;  Laterality: N/A;  . ESOPHAGOGASTRODUODENOSCOPY (EGD) WITH PROPOFOL N/A 08/04/2015   Procedure: ESOPHAGOGASTRODUODENOSCOPY (EGD) WITH PROPOFOL;  Surgeon: Mauri Pole, MD;  Location: [facility];  Service: Endoscopy;  Laterality: N/A;  . HERNIA REPAIR    . IR GENERIC HISTORICAL  03/04/2016   IR LUMBAR DISC ASPIRATION W/IMG GUIDE 03/04/2016 Luanne Bras, MD [facility]-INTERV RAD  . LAPAROSCOPIC NISSEN FUNDOPLICATION N/A 07/10/3011   Procedure: LAPAROSCOPIC  LYSIS OF ADHESIONS, SEGMENTAL GASTRECTOMY, PARTIAL REDUCTION OF HERNIA;  Surgeon: [name], MD;  Location: [facility];  Service: General;  Laterality: N/A;  . LAPAROSCOPIC PARTIAL COLECTOMY N/A 05/07/2017   Procedure: LAPAROSCOPIC  CONVERTED TO OPEN RIGHT COLECTOMY, PARASTOMAL HERNIA  REPAIR;  Surgeon: Leighton Ruff, MD;  Location: [facility];  Service: General;  Laterality: N/A;  . NISSEN FUNDOPLICATION  1438  . PERCUTANEOUS CORONARY STENT INTERVENTION (PCI-S) N/A 05/08/2011   Procedure: PERCUTANEOUS CORONARY STENT  INTERVENTION (PCI-S);  Surgeon: Wellington Hampshire, MD;  Location: [facility] CATH LAB;  Service: Cardiovascular;  Laterality: N/A;  . ROTATOR CUFF REPAIR  ~ 2001   right  . SHOULDER ARTHROSCOPY  ~ 2004; 2005   left; "joint's wore out"  . TONSILLECTOMY  ~ 1944  . TUBAL LIGATION  1972    SOCIAL HISTORY: Social History   Socioeconomic History  . Marital status: Divorced    Spouse name: Not on file  . Number of children: 3  . Years of education: Not on file  . Highest education level: Not on file  Occupational History  . Occupation: Retired  Scientific laboratory technician  . Financial resource strain: Not on file  . Food insecurity:    Worry: Not on file    Inability: Not on file  . Transportation needs:    Medical: Not on file    Non-medical: Not on file  Tobacco Use  . Smoking status: Former Smoker    Packs/day: 1.00    Years: 20.00    Pack years: 20.00    Types: Cigarettes    Start date: 10/12/1958    Last attempt to quit: 04/26/1988    Years since quitting: 29.4  . Smokeless tobacco: Never Used  Substance and Sexual Activity  . Alcohol use:  No    Alcohol/week: 0.0 oz  . Drug use: No  . Sexual activity: Never  Lifestyle  . Physical activity:    Days per week: Not on file    Minutes per session: Not on file  . Stress: Not on file  Relationships  . Social connections:    Talks on phone: Not on file    Gets together: Not on file    Attends religious service: Not on file    Active member of club or organization: Not on file    Attends meetings of clubs or organizations: Not on file    Relationship status: Not on file  . Intimate partner violence:    Fear of current or ex partner: Not on file    Emotionally abused: Not on file    Physically abused: Not on file    Forced sexual activity: Not on file  Other Topics Concern  . Not on file  Social History Narrative   Divorced.  Lives alone.  Ambulates with a walker.     FAMILY HISTORY: Family History  Problem Relation Age of Onset  .  Heart attack Father   . Hypertension Father   . Hypertension Mother   . Skin cancer Brother        melanoma  . Atrial fibrillation Sister   . Skin cancer Sister   . Colon cancer Neg Hx   . Esophageal cancer Neg Hx   . Rectal cancer Neg Hx   . Stomach cancer Neg Hx     ALLERGIES:  is allergic to sulfamethoxazole.  MEDICATIONS:  Current Outpatient Medications  Medication Sig Dispense Refill  . albuterol (PROVENTIL HFA;VENTOLIN HFA) 108 (90 Base) MCG/ACT inhaler Inhale 2 puffs into the lungs every 6 (six) hours as needed for wheezing or shortness of breath.     . ALPRAZolam (XANAX) 0.25 MG tablet TAKE 1/2 TO 1 TABLET 3 TIMES DAILY AS NEEDED 270 tablet 1  . amLODipine (NORVASC) 5 MG tablet TAKE ONE (1) TABLET EACH DAY (Patient taking differently: Take 5 mgs by mouth once daily at night) 90 tablet 1  . citalopram (CELEXA) 40 MG tablet TAKE ONE (1) TABLET EACH DAY (Patient taking differently: TAKE ONE (1) TABLET EACH DAY AT NIGHT) 90 tablet 3  . esomeprazole (NEXIUM) 40 MG capsule TAKE ONE (1) CAPSULE EACH DAY (Patient taking differently: TAKE ONE (1) CAPSULE EACH DAY--- takes in am) 90 capsule 1  . ferrous sulfate 325 (65 FE) MG EC tablet Take 325 mg by mouth daily with breakfast.     . fluticasone (FLONASE) 50 MCG/ACT nasal spray USE 2 SPRAYS IN EACH NOSTRIL DAILY (Patient taking differently: USE 2 SPRAYS IN EACH NOSTRIL DAILY AS NEEDED FOR CONGESTION) 48 g 1  . furosemide (LASIX) 20 MG tablet Take 1 tablet (20 mg total) by mouth as needed.    . hydrALAZINE (APRESOLINE) 50 MG tablet TAKE ONE (1) TABLET THREE (3) TIMES EACH DAY 270 tablet 1  . ibuprofen (ADVIL,MOTRIN) 200 MG tablet Take 200-400 mg by mouth daily as needed for headache or moderate pain.    . isosorbide mononitrate (IMDUR) 30 MG 24 hr tablet TAKE ONE TABLET EVERY MORNING 90 tablet 1  . levothyroxine (SYNTHROID, LEVOTHROID) 50 MCG tablet Take 1 tablet (50 mcg total) by mouth daily before breakfast. 90 tablet 1  . loratadine  (CLARITIN) 10 MG tablet Take 1 tablet (10 mg total) by mouth daily. (Patient taking differently: Take 10 mg by mouth at bedtime. ) 90 tablet 3  .  losartan (COZAAR) 100 MG tablet Take 0.5 tablets (50 mg total) by mouth daily. (Patient taking differently: Take 50 mg by mouth every morning. ) 90 tablet 1  . Meclizine HCl 25 MG CHEW TAKE 1 TABLET 3 TIMES DAILY AS NEEDED FOR DIZZINESS 30 each 0  . metoprolol succinate (TOPROL-XL) 25 MG 24 hr tablet TAKE ONE TABLET BY MOUTH TWICE DAILY (Patient taking differently: Take 25 mg by mouth once daily at night) 180 tablet 1  . montelukast (SINGULAIR) 10 MG tablet TAKE ONE TABLET DAILY AT BEDTIME 90 tablet 3  . nitroGLYCERIN (NITROSTAT) 0.4 MG SL tablet Place 1 tablet (0.4 mg total) under the tongue every 5 (five) minutes as needed for chest pain. Reported on 04/26/2015 25 tablet 3  . ondansetron (ZOFRAN) 8 MG tablet Take 1 tablet (8 mg total) by mouth every 8 (eight) hours as needed for nausea or vomiting. 20 tablet 0  . polyethylene glycol powder (GLYCOLAX/MIRALAX) powder MIX 17 GRAMS IN 80Z OF WATER/JUICE AND DRINK TWICE DAILY (Patient taking differently: every other day. MIX 17 GRAMS IN 80Z OF WATER/JUICE AND DRINK ONCE DAILY) 1054 g 2  . potassium chloride (K-DUR) 10 MEQ tablet TAKE ONE (1) TABLET EACH DAY (Patient taking differently: TAKE ONE (1) TABLET EACH DAY--- takes in am) 90 tablet 1  . Propylene Glycol (SYSTANE BALANCE OP) Apply 1 drop to eye daily as needed (dry eyes).    . rosuvastatin (CRESTOR) 10 MG tablet TAKE ONE (1) TABLET EACH DAY (Patient taking differently: Take 10 mgs by mouth daily at night) 90 tablet 1  . traMADol (ULTRAM) 50 MG tablet Take 1 tablet (50 mg total) by mouth every 12 (twelve) hours as needed. 20 tablet 1  . traZODone (DESYREL) 100 MG tablet TAKE ONE TABLET DAILY AT BEDTIME 90 tablet 1  . Vitamin D, Ergocalciferol, (DRISDOL) 50000 units CAPS capsule TAKE 1 CAPSULE ON TUESDAYS 12 capsule 3  . zolpidem (AMBIEN) 10 MG tablet TAKE  ONE TABLET DAILY AT BEDTIME 90 tablet 1   No current facility-administered medications for this visit.     REVIEW OF SYSTEMS:   Constitutional: Denies fevers, chills or abnormal night sweats Eyes: Denies blurriness of vision, double vision or watery eyes Ears, nose, mouth, throat, and face: Denies mucositis or sore throat Respiratory: Denies cough, dyspnea or wheezes Cardiovascular: Denies palpitation, chest discomfort or lower extremity swelling Gastrointestinal:  Denies heartburn (+) colostomy bag (+) nausea (+) constipation  Skin: Denies abnormal skin rashes Lymphatics: Denies new lymphadenopathy or easy bruising Neurological:Denies numbness, tingling or new weaknesses Behavioral/Psych: Mood is stable, no new changes  All other systems were reviewed with the patient and are negative.  PHYSICAL EXAMINATION: ECOG PERFORMANCE STATUS: 0  Vitals:   10/06/17 0812  BP: (!) 162/66  Pulse: 60  Resp: 17  Temp: 97.7 F (36.5 C)  SpO2: 96%   Filed Weights   10/06/17 0812  Weight: 144 lb 4.8 oz (65.5 kg)    GENERAL:alert, no distress and comfortable SKIN: skin color, texture, turgor are normal, no rashes or significant lesions EYES: normal, conjunctiva are pink and non-injected, sclera clear OROPHARYNX:no exudate, no erythema and lips, buccal mucosa, and tongue normal  NECK: supple, thyroid normal size, non-tender, without nodularity LYMPH:  no palpable lymphadenopathy in the cervical, axillary or inguinal LUNGS: clear to auscultation and percussion with normal breathing effort HEART: regular rate & rhythm, (+) 2/6 systolic murmur  ABDOMEN:abdomen soft, non-tender and normal bowel sounds (+) Midline incision healing well  (+) colostomy bag on  left  Musculoskeletal:no cyanosis of digits and no clubbing PSYCH: alert & oriented x 3 with fluent speech NEURO: no focal motor/sensory deficits  LABORATORY DATA:  I have reviewed the data as listed CBC Latest Ref Rng & Units 10/06/2017  07/07/2017 06/25/2017  WBC 3.9 - 10.3 K/uL 5.3 6.6 4.8  Hemoglobin 11.6 - 15.9 g/dL 12.4 10.8(L) 11.2(L)  Hematocrit 34.8 - 46.6 % 37.4 33.6(L) 35.4  Platelets 145 - 400 K/uL 176 199 171    CMP Latest Ref Rng & Units 07/29/2017 07/07/2017 06/25/2017  Glucose 65 - 99 mg/dL 93 99 89  BUN 8 - 27 mg/dL 16 16 13   Creatinine 0.57 - 1.00 mg/dL 1.44(H) 1.48(H) 1.43(H)  Sodium 134 - 144 mmol/L 143 142 142  Potassium 3.5 - 5.2 mmol/L 4.4 4.1 3.8  Chloride 96 - 106 mmol/L 107(H) 111(H) 108  CO2 20 - 29 mmol/L 22 24 25   Calcium 8.7 - 10.3 mg/dL 9.2 9.4 9.6  Total Protein 6.0 - 8.5 g/dL 5.9(L) 6.4 6.6  Total Bilirubin 0.0 - 1.2 mg/dL <0.2 0.3 0.5  Alkaline Phos 39 - 117 IU/L 62 69 67  AST 0 - 40 IU/L 9 14 11   ALT 0 - 32 IU/L 7 11 6    PROCEDURE  Echo 05/28/17 Study Conclusions - Left ventricle: The cavity size was normal. Wall thickness was normal. Systolic function was normal. The estimated ejection  fraction was in the range of 55% to 60%. Wall motion was normal; there were no regional wall motion abnormalities. Features are consistent with a pseudonormal left ventricular filling pattern,  with concomitant abnormal relaxation and increased filling pressure (grade 2 diastolic dysfunction). Doppler parameters are consistent with high ventricular filling pressure. - Aortic valve: Valve area (VTI): 1.64 cm^2. Valve area (Vmax): 1.55 cm^2. Valve area (Vmean): 1.65 cm^2. - Mitral valve: Moderately calcified annulus. Normal thickness leaflets . There was mild regurgitation. - Left atrium: The atrium was severely dilated. - Atrial septum: No defect or patent foramen ovale was identified. - Pulmonary arteries: Systolic pressure was mildly increased. PA peak pressure: 37 mm Hg (S). - Technically adequate study.  Colonoscopy by Dr. Stephanie Acre At [facility] 02/06/17 IMPRESSION: Colon, right, biopsy - Villous adenoma with multifocal high-grade dysplasia (multiple fragments), arising in a background of traditional  serrated adenoma, see comment. See Comment Comment -There is a vigorous stromal reaction, possibly reaction to previous biopsy. While no definitive invasion is identified, the specimen appears small in comparison to the clinically described lesion and may not be representative.  PATHOLOGY  Diagnosis 05/07/17 Colon, segmental resection for tumor, right colon and terminal ileum - INVASIVE COLORECTAL ADENOCARCINOMA, 3.3 CM. - TUMOR EXTENDS INTO PERICOLONIC CONNECTIVE TISSUE. - MARGINS NOT INVOLVED. - METASTATIC CARCINOMA IN ONE OF TWENTY-SIX LYMPH NODES (1/26). Microscopic Comment COLON AND RECTUM (INCLUDING TRANS-ANAL RESECTION): Specimen: Right colon and terminal ileum. Procedure: Resection. Tumor site: Distal ascending colon. Specimen integrity: Intact. Macroscopic intactness of mesorectum: Not applicable. Macroscopic tumor perforation: No. Invasive tumor: Maximum size: 3.3 cm. Histologic type(s): Colorectal adenocarcinoma. Histologic grade and differentiation: G2: moderately differentiated/low grade Type of polyp in which invasive carcinoma arose: Tubulovillous adenoma. Microscopic extension of invasive tumor: Into pericolonic connective tissue. Lymph-Vascular invasion: Present. Peri-neural invasion: No. Tumor deposit(s) (discontinuous extramural extension): No. Resection margins: Proximal margin: Free of tumor. Distal margin: Free of tumor. Circumferential (radial) (posterior ascending, posterior descending; lateral and posterior mid-rectum; and entire lower 1/3 rectum): Free of tumor. Mesenteric margin (sigmoid and transverse): N/A. Distance closest margin (if all above margins negative):  0.3 cm from circumferential radial margin. Treatment effect (neo-adjuvant therapy): No. 1 of 5 Supplemental copy SUPPLEMENTAL for [place], [place] I ([id number]) Microscopic Comment(continued) Additional polyp(s): Four tubular adenomas. Non-neoplastic findings: Benign appendix. Lymph nodes:  number examined 26; number positive: 1. Pathologic Staging: pT3, pN1a, pMX. Ancillary studies: Microsatellite instability by PCR and mismatch repair protein by IHC. (JDP:ah 05/09/17)   RADIOGRAPHIC STUDIES: I have personally reviewed the radiological images as listed and agreed with the findings in the report. No results found.  CT Chest WO Contrast 06/05/17 IMPRESSION: No evidence of metastatic disease or other acute findings within the thorax.  CT AP W Contrast 03/13/17 IMPRESSION: 1. Stable moderate to large parastomal hernia containing multiple small bowel loops in the ventral left abdominal wall at the end colostomy site. No evidence of acute bowel complication. 2. Large hiatal hernia. 3. Finely irregular liver surface, suggesting cirrhosis. Consider hepatic elastography for further liver fibrosis risk stratification, as clinically warranted. 4. Stable 1.0 cm cystic pancreatic neck lesion. Follow-up MRI abdomen without with IV contrast recommended in 2 years. This recommendation follows ACR consensus guidelines: Management of Incidental Pancreatic Cysts: A White Paper of the ACR Incidental Findings Committee. J Am Coll Radiol 1448;18:563-149. 5. Stable 4 mm left UPJ stone.  No hydronephrosis. 6.  Aortic Atherosclerosis (ICD10-I70.0).  Coronary atherosclerosis.  ASSESSMENT & PLAN:  [name] 83 y.o. female is a here because of recently diagnosed invasive colorectal adenocarcinoma.   1. Right colon adenocarcinoma Stage IIIB (pT3, pN1A, cM0), MSI-stable  -I previously reviewed her CT scan findings, and surgical pathology results in great details with patient and her family members -She had a complete surgical resection with [name] on 05/07/17 and pathology results reveal a T3 lesion and 1/26 lymph nodes positive for metastatic carcinoma.  Margins were negative. -She had a CT AP in Dec, 2018 that showed no sign of metastasis. I ordered a CT Chest that showed no signs of lung  metastases (March 2019).  -We previously discussed the risk of cancer recurrence after surgery due to her stage IIIB disease she is at moderate to high risk of recurrence and the role of adjuvant chemo  -She began adjuvant Xeloda 1500 BID on 06/26/17. She discontinued herself after 3 doses, she could not tolerate due to nausea and abdominal discomfort. She has fully recovered  -She is clinically doing very well, asymptomatic, exam was unremarkable.  Her lab CBC normal, CMP and CEA are still pending, no clinical concern for recurrence. - CT AP one week before 3 month follow up.  Due to her chronic renal insufficiency, will do CT without contrast. -F/u in 3 months, continue colon cancer surveillance.  2. HTN, CAD, diastolic CHF, AF, CKD stage III -Continue follow-up with her primary care physician and her cardiologist -her Echo from 05/28/17 had a LV EF of 55-60% -Follow-up with her primary care physician and the cardiologist.  3. Iron deficient anemia -Iron study otherwise normal, mildly low Hgb, 10.5-11.5 range  -Her anemia has resolved now.  Repeat iron study is still pending. -continue oral iron, duration will be determined based on her iron study results.  PLAN: -F/u in 3 months -Lab and CT abd/pel without contrast one week before  Orders Placed This Encounter  Procedures  . CT Abdomen Pelvis Wo Contrast    Standing Status:   Future    Standing Expiration Date:   10/06/2018    Order Specific Question:   Preferred imaging location?    Answer:   [facility]    Order Specific Question:   Is Oral Contrast requested for this exam?    Answer:   Yes, Per Radiology protocol    Order Specific Question:   Radiology Contrast Protocol - do NOT remove file path    Answer:   \\[email or web address]    All questions were answered. The patient knows to call the clinic with any problems, questions or concerns. I spent 15 minutes counseling the patient face to face. The  total time spent in the appointment was 20 minutes and more than 50% was on counseling.     Truitt Merle, MD 10/06/2017 8:53 AM

## 2017-10-06 ENCOUNTER — Encounter: Payer: Self-pay | Admitting: Hematology

## 2017-10-06 ENCOUNTER — Inpatient Hospital Stay: Payer: Medicare Other | Attending: Hematology

## 2017-10-06 ENCOUNTER — Inpatient Hospital Stay (HOSPITAL_BASED_OUTPATIENT_CLINIC_OR_DEPARTMENT_OTHER): Payer: Medicare Other | Admitting: Hematology

## 2017-10-06 ENCOUNTER — Telehealth: Payer: Self-pay

## 2017-10-06 VITALS — BP 162/66 | HR 60 | Temp 97.7°F | Resp 17 | Ht 62.5 in | Wt 144.3 lb

## 2017-10-06 DIAGNOSIS — C182 Malignant neoplasm of ascending colon: Secondary | ICD-10-CM | POA: Insufficient documentation

## 2017-10-06 DIAGNOSIS — D500 Iron deficiency anemia secondary to blood loss (chronic): Secondary | ICD-10-CM

## 2017-10-06 LAB — CMP (CANCER CENTER ONLY)
ALT: 9 U/L (ref 0–44)
AST: 12 U/L — AB (ref 15–41)
Albumin: 4.1 g/dL (ref 3.5–5.0)
Alkaline Phosphatase: 71 U/L (ref 38–126)
Anion gap: 9 (ref 5–15)
BUN: 14 mg/dL (ref 8–23)
CHLORIDE: 104 mmol/L (ref 98–111)
CO2: 27 mmol/L (ref 22–32)
CREATININE: 1.48 mg/dL — AB (ref 0.44–1.00)
Calcium: 9.6 mg/dL (ref 8.9–10.3)
GFR, EST AFRICAN AMERICAN: 37 mL/min — AB (ref >60)
GFR, Estimated: 32 mL/min — ABNORMAL LOW (ref >60)
Glucose, Bld: 87 mg/dL (ref 70–99)
POTASSIUM: 4 mmol/L (ref 3.5–5.1)
Sodium: 140 mmol/L (ref 135–145)
Total Bilirubin: 0.4 mg/dL (ref 0.3–1.2)
Total Protein: 6.8 g/dL (ref 6.5–8.1)

## 2017-10-06 LAB — CBC WITH DIFFERENTIAL (CANCER CENTER ONLY)
BASOS PCT: 1 %
Basophils Absolute: 0 10*3/uL (ref 0.0–0.1)
EOS PCT: 5 %
Eosinophils Absolute: 0.3 10*3/uL (ref 0.0–0.5)
HCT: 37.4 % (ref 34.8–46.6)
Hemoglobin: 12.4 g/dL (ref 11.6–15.9)
Lymphocytes Relative: 33 %
Lymphs Abs: 1.7 10*3/uL (ref 0.9–3.3)
MCH: 27 pg (ref 25.1–34.0)
MCHC: 33.1 g/dL (ref 31.5–36.0)
MCV: 81.6 fL (ref 79.5–101.0)
MONO ABS: 0.4 10*3/uL (ref 0.1–0.9)
MONOS PCT: 7 %
Neutro Abs: 2.9 10*3/uL (ref 1.5–6.5)
Neutrophils Relative %: 54 %
PLATELETS: 176 10*3/uL (ref 145–400)
RBC: 4.58 MIL/uL (ref 3.70–5.45)
RDW: 16.2 % — AB (ref 11.2–14.5)
WBC Count: 5.3 10*3/uL (ref 3.9–10.3)

## 2017-10-06 LAB — IRON AND TIBC
Iron: 129 ug/dL (ref 41–142)
Saturation Ratios: 48 % (ref 21–57)
TIBC: 268 ug/dL (ref 236–444)
UIBC: 139 ug/dL

## 2017-10-06 LAB — CEA (IN HOUSE-CHCC): CEA (CHCC-IN HOUSE): 3.41 ng/mL (ref 0.00–5.00)

## 2017-10-06 LAB — FERRITIN: Ferritin: 42 ng/mL (ref 11–307)

## 2017-10-06 NOTE — Telephone Encounter (Signed)
Printed avs and calender of upcoming appointment.per 7/8 los

## 2017-10-14 ENCOUNTER — Other Ambulatory Visit: Payer: Self-pay | Admitting: Physician Assistant

## 2017-10-14 DIAGNOSIS — Z8719 Personal history of other diseases of the digestive system: Secondary | ICD-10-CM

## 2017-10-14 DIAGNOSIS — I10 Essential (primary) hypertension: Secondary | ICD-10-CM

## 2017-10-14 NOTE — Telephone Encounter (Signed)
Last office visit 07/02/2017

## 2017-10-24 ENCOUNTER — Telehealth: Payer: Self-pay

## 2017-10-24 NOTE — Telephone Encounter (Signed)
Spoke with patient per Dr. Burr Medico informed her CEA and iron study were normal 2 weeks ago.   Patient verbalized an understanding.

## 2017-10-24 NOTE — Telephone Encounter (Signed)
-----   Message from Truitt Merle, MD sent at 10/24/2017 11:06 AM EDT ----- Please let pt know that her CEA and iron study were normal 2 weeks ago, thanks   [employer]  10/24/2017

## 2017-11-20 ENCOUNTER — Other Ambulatory Visit: Payer: Self-pay | Admitting: Physician Assistant

## 2017-11-24 NOTE — Telephone Encounter (Signed)
Last seen 07/02/17  [name]

## 2017-12-10 ENCOUNTER — Ambulatory Visit (INDEPENDENT_AMBULATORY_CARE_PROVIDER_SITE_OTHER): Payer: Medicare Other | Admitting: Physician Assistant

## 2017-12-10 ENCOUNTER — Encounter: Payer: Self-pay | Admitting: Physician Assistant

## 2017-12-10 VITALS — BP 167/70 | HR 66 | Temp 97.8°F | Ht 62.5 in | Wt 151.4 lb

## 2017-12-10 DIAGNOSIS — Z8719 Personal history of other diseases of the digestive system: Secondary | ICD-10-CM

## 2017-12-10 DIAGNOSIS — J069 Acute upper respiratory infection, unspecified: Secondary | ICD-10-CM

## 2017-12-10 DIAGNOSIS — Z933 Colostomy status: Secondary | ICD-10-CM | POA: Diagnosis not present

## 2017-12-10 DIAGNOSIS — C182 Malignant neoplasm of ascending colon: Secondary | ICD-10-CM | POA: Diagnosis not present

## 2017-12-10 DIAGNOSIS — K439 Ventral hernia without obstruction or gangrene: Secondary | ICD-10-CM | POA: Diagnosis not present

## 2017-12-10 MED ORDER — AZITHROMYCIN 250 MG PO TABS: ORAL_TABLET | ORAL | 0 refills | Status: DC

## 2017-12-11 NOTE — Progress Notes (Signed)
BP (!) 167/70   Pulse 66   Temp 97.8 F (36.6 C) (Oral)   Wt 151 lb 6.4 oz (68.7 kg)   BMI 27.25 kg/m    Subjective:    Patient ID: [name], female    DOB: [birth year], 83 y.o.   MRN: [id number]  HPI: [name] is a 83 y.o. female presenting on 12/10/2017 for Abdominal Pain (mid epigastric x mths)  This patient comes in for chronic abdominal pain.  She has had abdominal hernia and surgeries in the past.  She also has a colostomy related to her bowel surgery.  She states that the pain is primarily in the upper abdomen.  She feels a bulge in the wall on the left upper quadrant at times.  She states she is having some difficulty with her swallowing just because it feels full and pressuring up into her chest.  She denies any fever or chills.  She is seeing [name] in the past at [facility] surgery.  We will make a referral for this.  She is given warning that if anything significantly changes in her bowel function, fever chills or unrelenting pain to go to the emergency room.  This patient has had many days of sore throat and postnasal drainage, headache at times and sinus pressure. There is copious drainage at times. Denies any fever at this time. There has been a history of sinus infections in the past.  There is cough at night. It has become more prevalent in recent days.  Past Medical History:  Diagnosis Date  . Anemia due to blood loss, chronic   . Anxiety state, unspecified   . Atrophy of left kidney   . CKD (chronic kidney disease), stage III ([place])   . Coronary artery disease cardiologist-  dr Bronson Ing   a. Moderate to severe coronary disease involving the left anterior descending artery and right coronary artery.  Sequential stenosis in the LAD is significant.  Right coronary artery is moderate to severely likely nonischemic. Questionable small LVOT obstruction.  No Brockenbrough maneuver was performed.  Catheterization January 2013 w/ PTCA and DES x1 to pLAD b.  05/2014 MV no isch/infarct, EF 60%.  . Depressive disorder, not elsewhere classified   . Diastolic CHF, chronic [facility])    cardiologist-  dr Bronson Ing  . GERD (gastroesophageal reflux disease)   . H/O hiatal hernia   . History of [name]'s esophagus   . History of melanoma excision    back  . Hypertension   . Hypothyroidism   . Macular degeneration    both eyes  . PAF (paroxysmal atrial fibrillation) ([place])   . PONV (postoperative nausea and vomiting)   . Pulmonary hypertension ([place])    PA systolic [id number] mmHg by echocardiogram 06-17-2015  PA pressure 05-08-2011 by cardiac catheterization -- no sig. pulm. htn)PA saturation 62% thermodilution cardiac index 2.0 thick cardiac index 2.4  . Pure hypercholesterolemia   . S/P drug eluting coronary stent placement 05/08/2011   x1 DES to proximal LAD  . Wears dentures    lower  . Wears glasses    Relevant past medical, surgical, family and social history reviewed and updated as indicated. Interim medical history since our last visit reviewed. Allergies and medications reviewed and updated. DATA REVIEWED: CHART IN EPIC  Family History reviewed for pertinent findings.  Review of Systems  Constitutional: Positive for chills and fatigue. Negative for activity change, appetite change and fever.  HENT: Positive for congestion, postnasal drip,  sinus pressure and sore throat.   Eyes: Negative.   Respiratory: Positive for cough. Negative for wheezing.   Cardiovascular: Negative.  Negative for chest pain, palpitations and leg swelling.  Gastrointestinal: Positive for abdominal distention, abdominal pain and nausea. Negative for constipation and diarrhea.  Genitourinary: Negative.   Musculoskeletal: Negative.   Skin: Negative.   Neurological: Positive for headaches.    Allergies as of 12/10/2017      Reactions   Sulfamethoxazole Rash      Medication List        Accurate as of 12/10/17 11:59 PM. Always use your most recent med list.            albuterol 108 (90 Base) MCG/ACT inhaler Commonly known as:  PROVENTIL HFA;VENTOLIN HFA Inhale 2 puffs into the lungs every 6 (six) hours as needed for wheezing or shortness of breath.   ALPRAZolam 0.25 MG tablet Commonly known as:  XANAX TAKEK 1/2 TO 1 TABLET 3 TIMES DAILY AS NEEDED   amLODipine 5 MG tablet Commonly known as:  NORVASC TAKE ONE (1) TABLET EACH DAY   azithromycin 250 MG tablet Commonly known as:  ZITHROMAX Take as directed   citalopram 40 MG tablet Commonly known as:  CELEXA TAKE ONE (1) TABLET EACH DAY   esomeprazole 40 MG capsule Commonly known as:  NEXIUM TAKE ONE (1) CAPSULE EACH DAY   ferrous sulfate 325 (65 FE) MG EC tablet Take 325 mg by mouth daily with breakfast.   fluticasone 50 MCG/ACT nasal spray Commonly known as:  FLONASE USE 2 SPRAYS IN EACH NOSTRIL DAILY   furosemide 20 MG tablet Commonly known as:  LASIX Take 1 tablet (20 mg total) by mouth as needed.   hydrALAZINE 50 MG tablet Commonly known as:  APRESOLINE TAKE ONE (1) TABLET THREE (3) TIMES EACH DAY   ibuprofen 200 MG tablet Commonly known as:  ADVIL,MOTRIN Take 200-400 mg by mouth daily as needed for headache or moderate pain.   isosorbide mononitrate 30 MG 24 hr tablet Commonly known as:  IMDUR TAKE ONE TABLET EVERY MORNING   levothyroxine 50 MCG tablet Commonly known as:  SYNTHROID, LEVOTHROID TAKE ONE TABLET EACH MORNING BEFORE BREAKFAST   loratadine 10 MG tablet Commonly known as:  CLARITIN Take 1 tablet (10 mg total) by mouth daily.   losartan 100 MG tablet Commonly known as:  COZAAR Take 0.5 tablets (50 mg total) by mouth daily.   losartan 100 MG tablet Commonly known as:  COZAAR TAKE ONE (1) TABLET EACH DAY   Meclizine HCl 25 MG Chew TAKE 1 TABLET 3 TIMES DAILY AS NEEDED FOR DIZZINESS   metoprolol succinate 25 MG 24 hr tablet Commonly known as:  TOPROL-XL TAKE ONE TABLET BY MOUTH TWICE DAILY   montelukast 10 MG tablet Commonly known as:   SINGULAIR TAKE ONE TABLET DAILY AT BEDTIME   nitroGLYCERIN 0.4 MG SL tablet Commonly known as:  NITROSTAT Place 1 tablet (0.4 mg total) under the tongue every 5 (five) minutes as needed for chest pain. Reported on 04/26/2015   ondansetron 8 MG tablet Commonly known as:  ZOFRAN Take 1 tablet (8 mg total) by mouth every 8 (eight) hours as needed for nausea or vomiting.   ondansetron 4 MG tablet Commonly known as:  ZOFRAN TAKE 1 TABLET EVERY 8 HOURS AS NEEDED FOR NAUSEA AND VOMITING   polyethylene glycol powder powder Commonly known as:  GLYCOLAX/MIRALAX MIX 17 GRAMS IN 80Z OF WATER/JUICE AND DRINK TWICE DAILY   potassium chloride 10  MEQ tablet Commonly known as:  K-DUR TAKE ONE (1) TABLET EACH DAY   rosuvastatin 10 MG tablet Commonly known as:  CRESTOR TAKE ONE (1) TABLET EACH DAY   SYSTANE BALANCE OP Apply 1 drop to eye daily as needed (dry eyes).   traMADol 50 MG tablet Commonly known as:  ULTRAM Take 1 tablet (50 mg total) by mouth every 12 (twelve) hours as needed.   traZODone 100 MG tablet Commonly known as:  DESYREL TAKE ONE TABLET DAILY AT BEDTIME   Vitamin D (Ergocalciferol) 50000 units Caps capsule Commonly known as:  DRISDOL TAKE 1 CAPSULE ON TUESDAYS   zolpidem 10 MG tablet Commonly known as:  AMBIEN TAKE ONE TABLET DAILY AT BEDTIME          Objective:    BP (!) 167/70   Pulse 66   Temp 97.8 F (36.6 C) (Oral)   Wt 151 lb 6.4 oz (68.7 kg)   BMI 27.25 kg/m   Allergies  Allergen Reactions  . Sulfamethoxazole Rash    Wt Readings from Last 3 Encounters:  12/10/17 151 lb 6.4 oz (68.7 kg)  10/06/17 144 lb 4.8 oz (65.5 kg)  07/29/17 142 lb (64.4 kg)    Physical Exam  Constitutional: She is oriented to person, place, and time. She appears well-developed and well-nourished.  HENT:  Head: Normocephalic and atraumatic.  Right Ear: A middle ear effusion is present.  Left Ear: A middle ear effusion is present.  Nose: Mucosal edema present. Right  sinus exhibits no frontal sinus tenderness. Left sinus exhibits no frontal sinus tenderness.  Mouth/Throat: Posterior oropharyngeal erythema present. No oropharyngeal exudate or tonsillar abscesses.  Eyes: Pupils are equal, round, and reactive to light. Conjunctivae and EOM are normal.  Neck: Normal range of motion.  Cardiovascular: Normal rate, regular rhythm, normal heart sounds and intact distal pulses.  Pulmonary/Chest: Effort normal and breath sounds normal.  Abdominal: Soft. Bowel sounds are normal. She exhibits distension. She exhibits no shifting dullness, no abdominal bruit, no ascites and no mass. There is tenderness in the epigastric area and left upper quadrant. There is no rigidity, no rebound, no guarding, no CVA tenderness, no tenderness at McBurney's point and negative Murphy's sign. A hernia is present.    Colostomy bag in place, no abnormal findings  Neurological: She is alert and oriented to person, place, and time. She has normal reflexes.  Skin: Skin is warm and dry. No rash noted.  Psychiatric: She has a normal mood and affect. Her behavior is normal. Judgment and thought content normal.  Nursing note and vitals reviewed.   Results for orders placed or performed in visit on 10/06/17  Ferritin  Result Value Ref Range   Ferritin 42 11 - 307 ng/mL  Iron and TIBC  Result Value Ref Range   Iron 129 41 - 142 ug/dL   TIBC 268 236 - 444 ug/dL   Saturation Ratios 48 21 - 57 %   UIBC 139 ug/dL  CEA (IN HOUSE-[facility])  Result Value Ref Range   CEA ([facility]-In House) 3.41 0.00 - 5.00 ng/mL  CMP (Cancer Center only)  Result Value Ref Range   Sodium 140 135 - 145 mmol/L   Potassium 4.0 3.5 - 5.1 mmol/L   Chloride 104 98 - 111 mmol/L   CO2 27 22 - 32 mmol/L   Glucose, Bld 87 70 - 99 mg/dL   BUN 14 8 - 23 mg/dL   Creatinine 1.48 (H) 0.44 - 1.00 mg/dL   Calcium 9.6  8.9 - 10.3 mg/dL   Total Protein 6.8 6.5 - 8.1 g/dL   Albumin 4.1 3.5 - 5.0 g/dL   AST 12 (L) 15 - 41 U/L   ALT  9 0 - 44 U/L   Alkaline Phosphatase 71 38 - 126 U/L   Total Bilirubin 0.4 0.3 - 1.2 mg/dL   GFR, Est Non Af Am 32 (L) >60 mL/min   GFR, Est AFR Am 37 (L) >60 mL/min   Anion gap 9 5 - 15  CBC with Differential (Cancer Center Only)  Result Value Ref Range   WBC Count 5.3 3.9 - 10.3 K/uL   RBC 4.58 3.70 - 5.45 MIL/uL   Hemoglobin 12.4 11.6 - 15.9 g/dL   HCT 37.4 34.8 - 46.6 %   MCV 81.6 79.5 - 101.0 fL   MCH 27.0 25.1 - 34.0 pg   MCHC 33.1 31.5 - 36.0 g/dL   RDW 16.2 (H) 11.2 - 14.5 %   Platelet Count 176 145 - 400 K/uL   Neutrophils Relative % 54 %   Neutro Abs 2.9 1.5 - 6.5 K/uL   Lymphocytes Relative 33 %   Lymphs Abs 1.7 0.9 - 3.3 K/uL   Monocytes Relative 7 %   Monocytes Absolute 0.4 0.1 - 0.9 K/uL   Eosinophils Relative 5 %   Eosinophils Absolute 0.3 0.0 - 0.5 K/uL   Basophils Relative 1 %   Basophils Absolute 0.0 0.0 - 0.1 K/uL      Assessment & Plan:   1. Abdominal wall hernia - Ambulatory referral to General Surgery  2. Cancer of right colon [facility]) - Ambulatory referral to General Surgery  3. History of gastrointestinal obstruction - Ambulatory referral to General Surgery  4. Colostomy status ([place]) - Ambulatory referral to General Surgery  5. Upper respiratory tract infection, unspecified type - azithromycin (ZITHROMAX) 250 MG tablet; Take as directed  Dispense: 6 tablet; Refill: 0   Continue all other maintenance medications as listed above.  Follow up plan: No follow-ups on file.  Educational handout given for [place] [facility] [address]  [place], [place] [address] [phone]   12/11/2017, 11:13 AM

## 2017-12-26 ENCOUNTER — Ambulatory Visit (INDEPENDENT_AMBULATORY_CARE_PROVIDER_SITE_OTHER): Payer: Medicare Other | Admitting: Family Medicine

## 2017-12-26 ENCOUNTER — Encounter: Payer: Self-pay | Admitting: Family Medicine

## 2017-12-26 VITALS — BP 118/65 | HR 57 | Temp 97.4°F | Ht 62.5 in | Wt 148.4 lb

## 2017-12-26 DIAGNOSIS — H938X2 Other specified disorders of left ear: Secondary | ICD-10-CM

## 2017-12-26 MED ORDER — PREDNISONE 20 MG PO TABS: ORAL_TABLET | ORAL | 0 refills | Status: DC

## 2017-12-26 NOTE — Progress Notes (Signed)
BP 118/65   Pulse (!) 57   Temp (!) 97.4 F (36.3 C) (Oral)   Ht 5' 2.5" (1.588 m)   Wt 148 lb 6.4 oz (67.3 kg)   BMI 26.71 kg/m    Subjective:    Patient ID: [name], female    DOB: 30-Jun-1934, 83 y.o.   MRN: [id number]  HPI: [name] is a 83 y.o. female presenting on 12/26/2017 for Ear Pain (Left- Patient states she hears a noise in her ear. )   HPI Congestion left and an ocean like the sound Patient comes in complaining of left ear congestion and a noise in her ear that just started earlier today.  She says it sounds like the ocean in her ear and she initially thought the buzzing or sound was coming from somewhere in her house and she went around her house looking everywhere for it then she realized is coming from her ear and she still hears a even when she goes out or comes to our office today.  She denies any pain in the ear per se.  She denies any congestion or sinus issues but does admits that she gets a little bit of allergies this time year.  She denies any fevers or chills or hearing deficit that she has noticed.  She denies any trauma.  She denies any dizziness or nausea or vomiting  Relevant past medical, surgical, family and social history reviewed and updated as indicated. Interim medical history since our last visit reviewed. Allergies and medications reviewed and updated.  Review of Systems  Constitutional: Negative for chills and fever.  HENT: Positive for congestion and tinnitus. Negative for ear discharge, ear pain and hearing loss.   Eyes: Negative for redness and visual disturbance.  Respiratory: Negative for cough, chest tightness and shortness of breath.   Cardiovascular: Negative for chest pain and leg swelling.  Genitourinary: Negative for difficulty urinating and dysuria.  Musculoskeletal: Negative for back pain and gait problem.  Skin: Negative for rash.  Neurological: Negative for light-headedness and headaches.  Psychiatric/Behavioral: Negative for  agitation and behavioral problems.  All other systems reviewed and are negative.   Per HPI unless specifically indicated above   Allergies as of 12/26/2017      Reactions   Sulfamethoxazole Rash      Medication List        Accurate as of 12/26/17  3:58 PM. Always use your most recent med list.          albuterol 108 (90 Base) MCG/ACT inhaler Commonly known as:  PROVENTIL HFA;VENTOLIN HFA Inhale 2 puffs into the lungs every 6 (six) hours as needed for wheezing or shortness of breath.   ALPRAZolam 0.25 MG tablet Commonly known as:  XANAX TAKEK 1/2 TO 1 TABLET 3 TIMES DAILY AS NEEDED   amLODipine 5 MG tablet Commonly known as:  NORVASC TAKE ONE (1) TABLET EACH DAY   citalopram 40 MG tablet Commonly known as:  CELEXA TAKE ONE (1) TABLET EACH DAY   esomeprazole 40 MG capsule Commonly known as:  NEXIUM TAKE ONE (1) CAPSULE EACH DAY   ferrous sulfate 325 (65 FE) MG EC tablet Take 325 mg by mouth daily with breakfast.   fluticasone 50 MCG/ACT nasal spray Commonly known as:  FLONASE USE 2 SPRAYS IN EACH NOSTRIL DAILY   furosemide 20 MG tablet Commonly known as:  LASIX Take 1 tablet (20 mg total) by mouth as needed.   hydrALAZINE 50 MG tablet Commonly known  as:  APRESOLINE TAKE ONE (1) TABLET THREE (3) TIMES EACH DAY   isosorbide mononitrate 30 MG 24 hr tablet Commonly known as:  IMDUR TAKE ONE TABLET EVERY MORNING   levothyroxine 50 MCG tablet Commonly known as:  SYNTHROID, LEVOTHROID TAKE ONE TABLET EACH MORNING BEFORE BREAKFAST   loratadine 10 MG tablet Commonly known as:  CLARITIN Take 1 tablet (10 mg total) by mouth daily.   losartan 100 MG tablet Commonly known as:  COZAAR TAKE ONE (1) TABLET EACH DAY   Meclizine HCl 25 MG Chew TAKE 1 TABLET 3 TIMES DAILY AS NEEDED FOR DIZZINESS   metoprolol succinate 25 MG 24 hr tablet Commonly known as:  TOPROL-XL TAKE ONE TABLET BY MOUTH TWICE DAILY   montelukast 10 MG tablet Commonly known as:   SINGULAIR TAKE ONE TABLET DAILY AT BEDTIME   nitroGLYCERIN 0.4 MG SL tablet Commonly known as:  NITROSTAT Place 1 tablet (0.4 mg total) under the tongue every 5 (five) minutes as needed for chest pain. Reported on 04/26/2015   ondansetron 8 MG tablet Commonly known as:  ZOFRAN Take 1 tablet (8 mg total) by mouth every 8 (eight) hours as needed for nausea or vomiting.   polyethylene glycol powder powder Commonly known as:  GLYCOLAX/MIRALAX MIX 17 GRAMS IN 80Z OF WATER/JUICE AND DRINK TWICE DAILY   potassium chloride 10 MEQ tablet Commonly known as:  K-DUR TAKE ONE (1) TABLET EACH DAY   predniSONE 20 MG tablet Commonly known as:  DELTASONE 2 po at same time daily for 5 days   rosuvastatin 10 MG tablet Commonly known as:  CRESTOR TAKE ONE (1) TABLET EACH DAY   SYSTANE BALANCE OP Apply 1 drop to eye daily as needed (dry eyes).   traMADol 50 MG tablet Commonly known as:  ULTRAM Take 1 tablet (50 mg total) by mouth every 12 (twelve) hours as needed.   traZODone 100 MG tablet Commonly known as:  DESYREL TAKE ONE TABLET DAILY AT BEDTIME   Vitamin D (Ergocalciferol) 50000 units Caps capsule Commonly known as:  DRISDOL TAKE 1 CAPSULE ON TUESDAYS   zolpidem 10 MG tablet Commonly known as:  AMBIEN TAKE ONE TABLET DAILY AT BEDTIME          Objective:    BP 118/65   Pulse (!) 57   Temp (!) 97.4 F (36.3 C) (Oral)   Ht 5' 2.5" (1.588 m)   Wt 148 lb 6.4 oz (67.3 kg)   BMI 26.71 kg/m   Wt Readings from Last 3 Encounters:  12/26/17 148 lb 6.4 oz (67.3 kg)  12/10/17 151 lb 6.4 oz (68.7 kg)  10/06/17 144 lb 4.8 oz (65.5 kg)    Physical Exam  Constitutional: She is oriented to person, place, and time. She appears well-developed and well-nourished. No distress.  HENT:  Right Ear: Tympanic membrane, external ear and ear canal normal.  Left Ear: External ear and ear canal normal. Tympanic membrane is retracted.  Nose: Nose normal.  Mouth/Throat: Oropharynx is clear and  moist. No oropharyngeal exudate, posterior oropharyngeal edema or posterior oropharyngeal erythema.  Eyes: Conjunctivae are normal.  Cardiovascular: Normal rate, regular rhythm, normal heart sounds and intact distal pulses.  No murmur heard. Pulmonary/Chest: Effort normal and breath sounds normal. No respiratory distress. She has no wheezes.  Musculoskeletal: Normal range of motion. She exhibits no edema or tenderness.  Neurological: She is alert and oriented to person, place, and time. Coordination normal.  Skin: Skin is warm and dry. No rash noted. She  is not diaphoretic.  Psychiatric: She has a normal mood and affect. Her behavior is normal.  Nursing note and vitals reviewed.       Assessment & Plan:   Problem List Items Addressed This Visit    None    Visit Diagnoses    Ear congestion, left    -  Primary   Relevant Medications   predniSONE (DELTASONE) 20 MG tablet       Follow up plan: Return if symptoms worsen or fail to improve.  Counseling provided for all of the vaccine components No orders of the defined types were placed in this encounter.   Caryl Pina, MD [facility] 12/26/2017, 3:58 PM

## 2017-12-29 ENCOUNTER — Other Ambulatory Visit: Payer: Medicare Other

## 2018-01-01 ENCOUNTER — Inpatient Hospital Stay: Payer: Medicare Other | Attending: Hematology

## 2018-01-01 ENCOUNTER — Ambulatory Visit (HOSPITAL_COMMUNITY)
Admission: RE | Admit: 2018-01-01 | Discharge: 2018-01-01 | Disposition: A | Discharge status: Home / Self Care | Payer: Medicare Other | Source: Ambulatory Visit | Attending: Hematology | Admitting: Hematology

## 2018-01-01 DIAGNOSIS — I130 Hypertensive heart and chronic kidney disease with heart failure and stage 1 through stage 4 chronic kidney disease, or unspecified chronic kidney disease: Secondary | ICD-10-CM | POA: Insufficient documentation

## 2018-01-01 DIAGNOSIS — I5032 Chronic diastolic (congestive) heart failure: Secondary | ICD-10-CM | POA: Diagnosis not present

## 2018-01-01 DIAGNOSIS — I2510 Atherosclerotic heart disease of native coronary artery without angina pectoris: Secondary | ICD-10-CM | POA: Diagnosis not present

## 2018-01-01 DIAGNOSIS — I480 Paroxysmal atrial fibrillation: Secondary | ICD-10-CM | POA: Diagnosis not present

## 2018-01-01 DIAGNOSIS — Z79899 Other long term (current) drug therapy: Secondary | ICD-10-CM | POA: Insufficient documentation

## 2018-01-01 DIAGNOSIS — K219 Gastro-esophageal reflux disease without esophagitis: Secondary | ICD-10-CM | POA: Diagnosis not present

## 2018-01-01 DIAGNOSIS — Z933 Colostomy status: Secondary | ICD-10-CM | POA: Diagnosis not present

## 2018-01-01 DIAGNOSIS — E7800 Pure hypercholesterolemia, unspecified: Secondary | ICD-10-CM | POA: Insufficient documentation

## 2018-01-01 DIAGNOSIS — I272 Pulmonary hypertension, unspecified: Secondary | ICD-10-CM | POA: Insufficient documentation

## 2018-01-01 DIAGNOSIS — K449 Diaphragmatic hernia without obstruction or gangrene: Secondary | ICD-10-CM | POA: Diagnosis not present

## 2018-01-01 DIAGNOSIS — F329 Major depressive disorder, single episode, unspecified: Secondary | ICD-10-CM | POA: Diagnosis not present

## 2018-01-01 DIAGNOSIS — D500 Iron deficiency anemia secondary to blood loss (chronic): Secondary | ICD-10-CM

## 2018-01-01 DIAGNOSIS — Z85820 Personal history of malignant melanoma of skin: Secondary | ICD-10-CM | POA: Insufficient documentation

## 2018-01-01 DIAGNOSIS — E039 Hypothyroidism, unspecified: Secondary | ICD-10-CM | POA: Insufficient documentation

## 2018-01-01 DIAGNOSIS — C779 Secondary and unspecified malignant neoplasm of lymph node, unspecified: Secondary | ICD-10-CM | POA: Insufficient documentation

## 2018-01-01 DIAGNOSIS — J439 Emphysema, unspecified: Secondary | ICD-10-CM | POA: Diagnosis not present

## 2018-01-01 DIAGNOSIS — Z86010 Personal history of colonic polyps: Secondary | ICD-10-CM | POA: Diagnosis not present

## 2018-01-01 DIAGNOSIS — C182 Malignant neoplasm of ascending colon: Secondary | ICD-10-CM | POA: Insufficient documentation

## 2018-01-01 DIAGNOSIS — N183 Chronic kidney disease, stage 3 (moderate): Secondary | ICD-10-CM | POA: Insufficient documentation

## 2018-01-01 DIAGNOSIS — D649 Anemia, unspecified: Secondary | ICD-10-CM | POA: Diagnosis not present

## 2018-01-01 DIAGNOSIS — I700 Atherosclerosis of aorta: Secondary | ICD-10-CM | POA: Insufficient documentation

## 2018-01-01 DIAGNOSIS — Z87891 Personal history of nicotine dependence: Secondary | ICD-10-CM | POA: Insufficient documentation

## 2018-01-01 LAB — IRON AND TIBC
IRON: 30 ug/dL — AB (ref 41–142)
Saturation Ratios: 11 % — ABNORMAL LOW (ref 21–57)
TIBC: 275 ug/dL (ref 236–444)
UIBC: 245 ug/dL

## 2018-01-01 LAB — CMP (CANCER CENTER ONLY)
ALT: 9 U/L (ref 0–44)
AST: 10 U/L — AB (ref 15–41)
Albumin: 3.6 g/dL (ref 3.5–5.0)
Alkaline Phosphatase: 67 U/L (ref 38–126)
Anion gap: 8 (ref 5–15)
BUN: 21 mg/dL (ref 8–23)
CHLORIDE: 105 mmol/L (ref 98–111)
CO2: 28 mmol/L (ref 22–32)
Calcium: 9.1 mg/dL (ref 8.9–10.3)
Creatinine: 1.58 mg/dL — ABNORMAL HIGH (ref 0.44–1.00)
GFR, EST AFRICAN AMERICAN: 34 mL/min — AB (ref >60)
GFR, Estimated: 30 mL/min — ABNORMAL LOW (ref >60)
Glucose, Bld: 96 mg/dL (ref 70–99)
POTASSIUM: 4.1 mmol/L (ref 3.5–5.1)
Sodium: 141 mmol/L (ref 135–145)
Total Bilirubin: 0.3 mg/dL (ref 0.3–1.2)
Total Protein: 6.5 g/dL (ref 6.5–8.1)

## 2018-01-01 LAB — CBC WITH DIFFERENTIAL (CANCER CENTER ONLY)
Basophils Absolute: 0.1 10*3/uL (ref 0.0–0.1)
Basophils Relative: 1 %
Eosinophils Absolute: 0.2 10*3/uL (ref 0.0–0.5)
Eosinophils Relative: 3 %
HEMATOCRIT: 36.6 % (ref 34.8–46.6)
HEMOGLOBIN: 11.9 g/dL (ref 11.6–15.9)
LYMPHS ABS: 1.9 10*3/uL (ref 0.9–3.3)
LYMPHS PCT: 26 %
MCH: 27.1 pg (ref 25.1–34.0)
MCHC: 32.6 g/dL (ref 31.5–36.0)
MCV: 83.2 fL (ref 79.5–101.0)
Monocytes Absolute: 0.5 10*3/uL (ref 0.1–0.9)
Monocytes Relative: 6 %
NEUTROS PCT: 64 %
Neutro Abs: 4.7 10*3/uL (ref 1.5–6.5)
Platelet Count: 201 10*3/uL (ref 145–400)
RBC: 4.4 MIL/uL (ref 3.70–5.45)
RDW: 15.5 % — ABNORMAL HIGH (ref 11.2–14.5)
WBC: 7.4 10*3/uL (ref 3.9–10.3)

## 2018-01-01 LAB — CEA (IN HOUSE-CHCC): CEA (CHCC-In House): 2.48 ng/mL (ref 0.00–5.00)

## 2018-01-01 LAB — FERRITIN: FERRITIN: 40 ng/mL (ref 11–307)

## 2018-01-02 NOTE — Progress Notes (Signed)
[name]  [phone] [phone]  Clinic Follow Up Note   Patient Care Team: Terald Sleeper, PA-C as PCP - General (General Practice) Okey Regal, Combine (Optometry) Herminio Commons, MD as Attending Physician (Cardiology) Truitt Merle, MD as Consulting Physician (Hematology) Leighton Ruff, MD as Consulting Physician (General Surgery) 01/05/2018   CHIEF COMPLAINTS:  Follow up invasive colorectal adenocarcinoma  Oncology History   Cancer Staging Cancer of right colon [facility]) Staging form: Colon and Rectum, AJCC 8th Edition - Pathologic stage from 05/07/2017: Stage IIIB (pT3, pN1a, cM0) - Signed by Truitt Merle, MD on 05/20/2017       Cancer of right colon ([place])   02/06/2017 Procedure    IMPRESSION: Colon, right, biopsy - Villous adenoma with multifocal high-grade dysplasia (multiple fragments), arising in a background of traditional serrated adenoma, see comment. See Comment Comment -There is a vigorous stromal reaction, possibly reaction to previous biopsy. While no definitive invasion is identified, the specimen appears small in comparison to the clinically described lesion and may not be representative.     03/13/2017 Imaging    CT AP W Contrast 03/13/17 IMPRESSION: 1. Stable moderate to large parastomal hernia containing multiple small bowel loops in the ventral left abdominal wall at the end colostomy site. No evidence of acute bowel complication. 2. Large hiatal hernia. 3. Finely irregular liver surface, suggesting cirrhosis. Consider hepatic elastography for further liver fibrosis risk stratification, as clinically warranted. 4. Stable 1.0 cm cystic pancreatic neck lesion. Follow-up MRI abdomen without with IV contrast recommended in 2 years. This recommendation follows ACR consensus guidelines: Management of Incidental Pancreatic Cysts: A White Paper of the ACR Incidental Findings Committee. J Am Coll Radiol 2197;58:832-549. 5.  Stable 4 mm left UPJ stone.  No hydronephrosis. 6.  Aortic Atherosclerosis (ICD10-I70.0).  Coronary atherosclerosis.    05/07/2017 Surgery    Colectomy and parastomal hernia repair by [name]     05/07/2017 Pathology Results    Diagnosis 05/07/17 Colon, segmental resection for tumor, right colon and terminal ileum - INVASIVE COLORECTAL ADENOCARCINOMA, 3.3 CM. - TUMOR EXTENDS INTO PERICOLONIC CONNECTIVE TISSUE. - MARGINS NOT INVOLVED. - METASTATIC CARCINOMA IN ONE OF TWENTY-SIX LYMPH NODES (1/26).    05/20/2017 Initial Diagnosis    Cancer of right colon ([place])    05/28/2017 Procedure    Echo Study Conclusions - Left ventricle: The cavity size was normal. Wall thickness was normal. Systolic function was normal. The estimated ejection  fraction was in the range of 55% to 60%. Wall motion was normal; there were no regional wall motion abnormalities. Features are consistent with a pseudonormal left ventricular filling pattern,  with concomitant abnormal relaxation and increased filling pressure (grade 2 diastolic dysfunction). Doppler parameters are consistent with high ventricular filling pressure. - Aortic valve: Valve area (VTI): 1.64 cm^2. Valve area (Vmax): 1.55 cm^2. Valve area (Vmean): 1.65 cm^2. - Mitral valve: Moderately calcified annulus. Normal thickness leaflets . There was mild regurgitation. - Left atrium: The atrium was severely dilated. - Atrial septum: No defect or patent foramen ovale was identified. - Pulmonary arteries: Systolic pressure was mildly increased. PA peak pressure: 37 mm Hg (S). - Technically adequate study.    06/05/2017 Imaging    CT CHEST W CONTRAST IMPRESSION: -No evidence of metastatic disease or other acute findings within the thorax. -Aortic Atherosclerosis (ICD10-I70.0) and Emphysema (ICD10-J43.9). -Coronary artery calcification. -Large hiatal hernia.     06/05/2017 Imaging    CT Chest WO Contrast 06/05/17 IMPRESSION: No evidence of metastatic  disease  or other acute findings within the thorax.    01/01/2018 Imaging    01/01/2018 CT AP  IMPRESSION: 1. LEFT lower quadrant colostomy without complication. 2. No evidence of colorectal carcinoma recurrence or metastasis. 3. Large hiatal hernia with the entire stomach above the diaphragm.      HISTORY OF PRESENTING ILLNESS:  [name] 83 y.o. female is a here because of newly diagnosed invasive colorectal adenocarcinoma. The patient was referred by [name]. The patient presents to the clinic today accompanied by her daughters.  Prior to cancer discovery, she reports a polyp was discovered in her colon 2 years ago and it was partially removed. She notes she did not have any symptoms. No abdominal pain, boating, change in bowel habits, or blood in stool.   Pt had a colonoscopy on 02/06/17 that revealed villous adenoma with multifocal high-grade dysplasia (multiple fragments), arising in a background of traditional serrated adenoma. Pt underwent a colectomy and parastomal hernia repair on 05/07/17 with [name]. Pathology results revealed invasive colorectal adenocarcinoma. There was a 3.3 cm tumor that extends into pericolonic connective tissue. 1/26 lymph nodes biopsied contained metastatic carcinoma.   Today the patient notes she is recovering from surgery. She is taking Ibuprofen occasionally for some pain around her incision.   Socially she is retired and lives in [place] alone.    In the past the patient had a previous surgical history of hiatal hernia repair, cholecystectomy, partial colectomy with colostomy creation/hartmann procedure, rotator cuff surgery, cataract surgery, coronary stent intervention and tubal ligation.   She also has been diagnosed with significant comorbidities of HTN, Hypothyroidism, CHF and she has a Hx of melanoma. She sees are cardiologist and nephrologist regularly and she takes Lasix. She states her HTN is controlled. She has no FHx of CA.   She  endorses tobacco use and that she quit in 1990.   CURRENT THERAPY: Surveillance  INTERVAL HISTORY:  [name] is a 83 y.o. female who is here for follow for her colon cancer. Her last CT AP was on 01/01/2018, and it revealed no disease recurrence. She has been following up with [facility] Dr. Ronnald Ramp and Dr. Warrick Parisian for chronic abdominal pain and left ear congestion. Today, she is here alone at the clinic. She is doing well. She states that she has epigastric pain that she relates to her hiatal hernia. Her BM are unpredictable, but she denies incontinence. She states that [name] informed her that she will not have colostomy reversal due to co-morbidities. She has adjusted to the colostomy bag and is capable of carrying out daily activities nicely.    MEDICAL HISTORY:  Past Medical History:  Diagnosis Date  . Anemia due to blood loss, chronic   . Anxiety state, unspecified   . Atrophy of left kidney   . CKD (chronic kidney disease), stage III ([place])   . Coronary artery disease cardiologist-  dr Bronson Ing   a. Moderate to severe coronary disease involving the left anterior descending artery and right coronary artery.  Sequential stenosis in the LAD is significant.  Right coronary artery is moderate to severely likely nonischemic. Questionable small LVOT obstruction.  No Brockenbrough maneuver was performed.  Catheterization January 2013 w/ PTCA and DES x1 to pLAD b. 05/2014 MV no isch/infarct, EF 60%.  . Depressive disorder, not elsewhere classified   . Diastolic CHF, chronic [facility])    cardiologist-  dr Bronson Ing  . GERD (gastroesophageal reflux disease)   . H/O  hiatal hernia   . History of [name]'s esophagus   . History of melanoma excision    back  . Hypertension   . Hypothyroidism   . Macular degeneration    both eyes  . PAF (paroxysmal atrial fibrillation) ([place])   . PONV (postoperative nausea and vomiting)   . Pulmonary hypertension ([place])    PA systolic [id number] mmHg  by echocardiogram 06-17-2015  PA pressure 05-08-2011 by cardiac catheterization -- no sig. pulm. htn)PA saturation 62% thermodilution cardiac index 2.0 thick cardiac index 2.4  . Pure hypercholesterolemia   . S/P drug eluting coronary stent placement 05/08/2011   x1 DES to proximal LAD  . Wears dentures    lower  . Wears glasses     SURGICAL HISTORY: Past Surgical History:  Procedure Laterality Date  . CARDIAC CATHETERIZATION Left 03/05/2015   Procedure: CENTRAL LINE INSERTION;  Surgeon: Aviva Signs, MD;  Location: [facility];  Service: General;  Laterality: Left;  . CATARACT EXTRACTION W/ INTRAOCULAR LENS  IMPLANT, BILATERAL  ~ 2002  . CHOLECYSTECTOMY N/A 07/18/2016   Procedure: LAPAROSCOPIC CHOLECYSTECTOMY WITH INTRAOPERATIVE CHOLANGIOGRAM;  Surgeon: [name], MD;  Location: [facility];  Service: General;  Laterality: N/A;  . COLECTOMY WITH COLOSTOMY CREATION/HARTMANN PROCEDURE N/A 03/05/2015   Procedure: PARTIAL COLECTOMY WITH COLOSTOMY CREATION/HARTMANN PROCEDURE;  Surgeon: Aviva Signs, MD;  Location: [facility];  Service: General;  Laterality: N/A;  . ESOPHAGEAL MANOMETRY N/A 06/06/2014   Procedure: ESOPHAGEAL MANOMETRY (EM);  Surgeon: Jerene Bears, MD;  Location: [facility];  Service: Gastroenterology;  Laterality: N/A;  . ESOPHAGOGASTRODUODENOSCOPY (EGD) WITH PROPOFOL N/A 08/04/2015   Procedure: ESOPHAGOGASTRODUODENOSCOPY (EGD) WITH PROPOFOL;  Surgeon: Mauri Pole, MD;  Location: [facility];  Service: Endoscopy;  Laterality: N/A;  . HERNIA REPAIR    . IR GENERIC HISTORICAL  03/04/2016   IR LUMBAR DISC ASPIRATION W/IMG GUIDE 03/04/2016 Luanne Bras, MD [facility]-INTERV RAD  . LAPAROSCOPIC NISSEN FUNDOPLICATION N/A 2/80/0349   Procedure: LAPAROSCOPIC  LYSIS OF ADHESIONS, SEGMENTAL GASTRECTOMY, PARTIAL REDUCTION OF HERNIA;  Surgeon: [name], MD;  Location: [facility];  Service: General;  Laterality: N/A;  . LAPAROSCOPIC PARTIAL COLECTOMY N/A 05/07/2017   Procedure: LAPAROSCOPIC   CONVERTED TO OPEN RIGHT COLECTOMY, PARASTOMAL HERNIA  REPAIR;  Surgeon: Leighton Ruff, MD;  Location: [facility];  Service: General;  Laterality: N/A;  . NISSEN FUNDOPLICATION  1791  . PERCUTANEOUS CORONARY STENT INTERVENTION (PCI-S) N/A 05/08/2011   Procedure: PERCUTANEOUS CORONARY STENT INTERVENTION (PCI-S);  Surgeon: Wellington Hampshire, MD;  Location: [facility] CATH LAB;  Service: Cardiovascular;  Laterality: N/A;  . ROTATOR CUFF REPAIR  ~ 2001   right  . SHOULDER ARTHROSCOPY  ~ 2004; 2005   left; "joint's wore out"  . TONSILLECTOMY  ~ 1944  . TUBAL LIGATION  1972    SOCIAL HISTORY: Social History   Socioeconomic History  . Marital status: Divorced    Spouse name: Not on file  . Number of children: 3  . Years of education: Not on file  . Highest education level: Not on file  Occupational History  . Occupation: Retired  Scientific laboratory technician  . Financial resource strain: Not on file  . Food insecurity:    Worry: Not on file    Inability: Not on file  . Transportation needs:    Medical: Not on file    Non-medical: Not on file  Tobacco Use  . Smoking status: Former Smoker    Packs/day: 1.00    Years: 20.00    Pack years: 20.00  Types: Cigarettes    Start date: 10/12/1958    Last attempt to quit: 04/26/1988    Years since quitting: 29.7  . Smokeless tobacco: Never Used  Substance and Sexual Activity  . Alcohol use: No    Alcohol/week: 0.0 standard drinks  . Drug use: No  . Sexual activity: Never  Lifestyle  . Physical activity:    Days per week: Not on file    Minutes per session: Not on file  . Stress: Not on file  Relationships  . Social connections:    Talks on phone: Not on file    Gets together: Not on file    Attends religious service: Not on file    Active member of club or organization: Not on file    Attends meetings of clubs or organizations: Not on file    Relationship status: Not on file  . Intimate partner violence:    Fear of current or ex partner: Not on file     Emotionally abused: Not on file    Physically abused: Not on file    Forced sexual activity: Not on file  Other Topics Concern  . Not on file  Social History Narrative   Divorced.  Lives alone.  Ambulates with a walker.     FAMILY HISTORY: Family History  Problem Relation Age of Onset  . Heart attack Father   . Hypertension Father   . Hypertension Mother   . Skin cancer Brother        melanoma  . Atrial fibrillation Sister   . Skin cancer Sister   . Colon cancer Neg Hx   . Esophageal cancer Neg Hx   . Rectal cancer Neg Hx   . Stomach cancer Neg Hx     ALLERGIES:  is allergic to sulfamethoxazole.  MEDICATIONS:  Current Outpatient Medications  Medication Sig Dispense Refill  . albuterol (PROVENTIL HFA;VENTOLIN HFA) 108 (90 Base) MCG/ACT inhaler Inhale 2 puffs into the lungs every 6 (six) hours as needed for wheezing or shortness of breath.     . ALPRAZolam (XANAX) 0.25 MG tablet TAKEK 1/2 TO 1 TABLET 3 TIMES DAILY AS NEEDED 270 tablet 0  . amLODipine (NORVASC) 5 MG tablet TAKE ONE (1) TABLET EACH DAY 90 tablet 1  . citalopram (CELEXA) 40 MG tablet TAKE ONE (1) TABLET EACH DAY (Patient taking differently: TAKE ONE (1) TABLET EACH DAY AT NIGHT) 90 tablet 3  . esomeprazole (NEXIUM) 40 MG capsule TAKE ONE (1) CAPSULE EACH DAY 90 capsule 2  . ferrous sulfate 325 (65 FE) MG EC tablet Take 325 mg by mouth daily with breakfast.     . fluticasone (FLONASE) 50 MCG/ACT nasal spray USE 2 SPRAYS IN EACH NOSTRIL DAILY 48 g 9  . furosemide (LASIX) 20 MG tablet Take 1 tablet (20 mg total) by mouth as needed.    . hydrALAZINE (APRESOLINE) 50 MG tablet TAKE ONE (1) TABLET THREE (3) TIMES EACH DAY 270 tablet 1  . isosorbide mononitrate (IMDUR) 30 MG 24 hr tablet TAKE ONE TABLET EVERY MORNING 90 tablet 0  . levothyroxine (SYNTHROID, LEVOTHROID) 50 MCG tablet TAKE ONE TABLET EACH MORNING BEFORE BREAKFAST 90 tablet 2  . loratadine (CLARITIN) 10 MG tablet Take 1 tablet (10 mg total) by mouth daily.  (Patient taking differently: Take 10 mg by mouth at bedtime. ) 90 tablet 3  . losartan (COZAAR) 100 MG tablet TAKE ONE (1) TABLET EACH DAY 90 tablet 0  . Meclizine HCl 25 MG CHEW TAKE 1  TABLET 3 TIMES DAILY AS NEEDED FOR DIZZINESS 30 each 0  . metoprolol succinate (TOPROL-XL) 25 MG 24 hr tablet TAKE ONE TABLET BY MOUTH TWICE DAILY (Patient taking differently: Take 25 mg by mouth once daily at night) 180 tablet 1  . montelukast (SINGULAIR) 10 MG tablet TAKE ONE TABLET DAILY AT BEDTIME 90 tablet 3  . nitroGLYCERIN (NITROSTAT) 0.4 MG SL tablet Place 1 tablet (0.4 mg total) under the tongue every 5 (five) minutes as needed for chest pain. Reported on 04/26/2015 25 tablet 3  . ondansetron (ZOFRAN) 8 MG tablet Take 1 tablet (8 mg total) by mouth every 8 (eight) hours as needed for nausea or vomiting. 20 tablet 0  . polyethylene glycol powder (GAVILAX) powder MIX 17 GRAMS IN 80Z OF WATER/JUICE AND DRINK TWICE DAILY 1020 g 3  . potassium chloride (K-DUR) 10 MEQ tablet TAKE ONE (1) TABLET EACH DAY 90 tablet 0  . predniSONE (DELTASONE) 20 MG tablet 2 po at same time daily for 5 days 10 tablet 0  . Propylene Glycol (SYSTANE BALANCE OP) Apply 1 drop to eye daily as needed (dry eyes).    . rosuvastatin (CRESTOR) 10 MG tablet TAKE ONE (1) TABLET EACH DAY 90 tablet 1  . traMADol (ULTRAM) 50 MG tablet Take 1 tablet (50 mg total) by mouth every 12 (twelve) hours as needed. 20 tablet 1  . traZODone (DESYREL) 100 MG tablet TAKE ONE TABLET DAILY AT BEDTIME 90 tablet 0  . Vitamin D, Ergocalciferol, (DRISDOL) 50000 units CAPS capsule TAKE 1 CAPSULE ON TUESDAYS 12 capsule 3  . zolpidem (AMBIEN) 10 MG tablet TAKE ONE TABLET DAILY AT BEDTIME 90 tablet 1   No current facility-administered medications for this visit.     REVIEW OF SYSTEMS:   Constitutional: Denies fevers, chills or abnormal night sweats Eyes: Denies blurriness of vision, double vision or watery eyes Ears, nose, mouth, throat, and face: Denies mucositis  or sore throat Respiratory: Denies cough, dyspnea or wheezes Cardiovascular: Denies palpitation, chest discomfort or lower extremity swelling Gastrointestinal:  Denies heartburn (+) colostomy bag (+) unpredictable BMs (+) epigastric discomfort  Skin: Denies abnormal skin rashes Lymphatics: Denies new lymphadenopathy or easy bruising Neurological:Denies numbness, tingling or new weaknesses Behavioral/Psych: Mood is stable, no new changes  All other systems were reviewed with the patient and are negative.  PHYSICAL EXAMINATION: ECOG PERFORMANCE STATUS: 0  Vitals:   01/05/18 0810  BP: (!) 147/61  Pulse: (!) 51  Resp: 17  Temp: 97.7 F (36.5 C)  SpO2: 97%   Filed Weights   01/05/18 0810  Weight: 152 lb 6.4 oz (69.1 kg)    GENERAL:alert, no distress and comfortable SKIN: skin color, texture, turgor are normal, no rashes or significant lesions EYES: normal, conjunctiva are pink and non-injected, sclera clear OROPHARYNX:no exudate, no erythema and lips, buccal mucosa, and tongue normal  NECK: supple, thyroid normal size, non-tender, without nodularity LYMPH:  no palpable lymphadenopathy in the cervical, axillary or inguinal LUNGS: clear to auscultation and percussion with normal breathing effort HEART: regular rate & rhythm, (+) 2/6 systolic murmur  ABDOMEN:abdomen soft, non-tender and normal bowel sounds (+) Midline incision healing well  (+) colostomy bag on left, clean Musculoskeletal:no cyanosis of digits and no clubbing PSYCH: alert & oriented x 3 with fluent speech NEURO: no focal motor/sensory deficits  LABORATORY DATA:  I have reviewed the data as listed CBC Latest Ref Rng & Units 01/01/2018 10/06/2017 07/07/2017  WBC 3.9 - 10.3 K/uL 7.4 5.3 6.6  Hemoglobin 11.6 -  15.9 g/dL 11.9 12.4 10.8(L)  Hematocrit 34.8 - 46.6 % 36.6 37.4 33.6(L)  Platelets 145 - 400 K/uL 201 176 199    CMP Latest Ref Rng & Units 01/01/2018 10/06/2017 07/29/2017  Glucose 70 - 99 mg/dL 96 87 93  BUN 8 -  23 mg/dL 21 14 16   Creatinine 0.44 - 1.00 mg/dL 1.58(H) 1.48(H) 1.44(H)  Sodium 135 - 145 mmol/L 141 140 143  Potassium 3.5 - 5.1 mmol/L 4.1 4.0 4.4  Chloride 98 - 111 mmol/L 105 104 107(H)  CO2 22 - 32 mmol/L 28 27 22   Calcium 8.9 - 10.3 mg/dL 9.1 9.6 9.2  Total Protein 6.5 - 8.1 g/dL 6.5 6.8 5.9(L)  Total Bilirubin 0.3 - 1.2 mg/dL 0.3 0.4 <0.2  Alkaline Phos 38 - 126 U/L 67 71 62  AST 15 - 41 U/L 10(L) 12(L) 9  ALT 0 - 44 U/L 9 9 7    Tumor Marker CEA Results for Richardson, Heather HODGKINS (MRN [id number]) as of 01/02/2018 09:33  Ref. Range 05/21/2017 15:45 10/06/2017 07:47 01/01/2018 10:24  CEA ([facility]) Latest Ref Range: 0.00 - 5.00 ng/mL 1.84 3.41 2.48    PROCEDURE  Echo 05/28/17 Study Conclusions - Left ventricle: The cavity size was normal. Wall thickness was normal. Systolic function was normal. The estimated ejection  fraction was in the range of 55% to 60%. Wall motion was normal; there were no regional wall motion abnormalities. Features are consistent with a pseudonormal left ventricular filling pattern,  with concomitant abnormal relaxation and increased filling pressure (grade 2 diastolic dysfunction). Doppler parameters are consistent with high ventricular filling pressure. - Aortic valve: Valve area (VTI): 1.64 cm^2. Valve area (Vmax): 1.55 cm^2. Valve area (Vmean): 1.65 cm^2. - Mitral valve: Moderately calcified annulus. Normal thickness leaflets . There was mild regurgitation. - Left atrium: The atrium was severely dilated. - Atrial septum: No defect or patent foramen ovale was identified. - Pulmonary arteries: Systolic pressure was mildly increased. PA peak pressure: 37 mm Hg (S). - Technically adequate study.  Colonoscopy by Dr. Stephanie Acre At [facility] 02/06/17 IMPRESSION: Colon, right, biopsy - Villous adenoma with multifocal high-grade dysplasia (multiple fragments), arising in a background of traditional serrated adenoma, see comment. See Comment Comment -There is a vigorous  stromal reaction, possibly reaction to previous biopsy. While no definitive invasion is identified, the specimen appears small in comparison to the clinically described lesion and may not be representative.  PATHOLOGY  Diagnosis 05/07/17 Colon, segmental resection for tumor, right colon and terminal ileum - INVASIVE COLORECTAL ADENOCARCINOMA, 3.3 CM. - TUMOR EXTENDS INTO PERICOLONIC CONNECTIVE TISSUE. - MARGINS NOT INVOLVED. - METASTATIC CARCINOMA IN ONE OF TWENTY-SIX LYMPH NODES (1/26). Microscopic Comment COLON AND RECTUM (INCLUDING TRANS-ANAL RESECTION): Specimen: Right colon and terminal ileum. Procedure: Resection. Tumor site: Distal ascending colon. Specimen integrity: Intact. Macroscopic intactness of mesorectum: Not applicable. Macroscopic tumor perforation: No. Invasive tumor: Maximum size: 3.3 cm. Histologic type(s): Colorectal adenocarcinoma. Histologic grade and differentiation: G2: moderately differentiated/low grade Type of polyp in which invasive carcinoma arose: Tubulovillous adenoma. Microscopic extension of invasive tumor: Into pericolonic connective tissue. Lymph-Vascular invasion: Present. Peri-neural invasion: No. Tumor deposit(s) (discontinuous extramural extension): No. Resection margins: Proximal margin: Free of tumor. Distal margin: Free of tumor. Circumferential (radial) (posterior ascending, posterior descending; lateral and posterior mid-rectum; and entire lower 1/3 rectum): Free of tumor. Mesenteric margin (sigmoid and transverse): N/A. Distance closest margin (if all above margins negative): 0.3 cm from circumferential radial margin. Treatment effect (neo-adjuvant therapy): No. Additional polyp(s): Four tubular adenomas.  Non-neoplastic findings: Benign appendix. Lymph nodes: number examined 26; number positive: 1. Pathologic Staging: pT3, pN1a, pMX. Ancillary studies: Microsatellite instability by PCR and mismatch repair protein by IHC. (JDP:ah  05/09/17)   RADIOGRAPHIC STUDIES: I have personally reviewed the radiological images as listed and agreed with the findings in the report. Ct Abdomen Pelvis Wo Contrast  Result Date: 01/01/2018 CLINICAL DATA:  Non contrast per MD Pt. States hx of colon ca dx'd 05/2017, chemo pills stopped, cholecystectomy, colon resection with colostomy Pt. C/o upper abdominal pain, nausea EXAM: CT ABDOMEN AND PELVIS WITHOUT CONTRAST TECHNIQUE: Multidetector CT imaging of the abdomen and pelvis was performed following the standard protocol without IV contrast. COMPARISON:  CT 03/13/17 FINDINGS: Lower chest: Lung bases are clear. Large hiatal hernia extends into the RIGHT posterior mediastinum. Hepatobiliary: No focal hepatic lesion on noncontrast exam. Pancreas: Pancreas is normal. No ductal dilatation. No pancreatic inflammation. Spleen: Normal spleen Adrenals/urinary tract: Adrenal glands normal. Bilateral simple fluid attenuation cystic renal lesions. No hydronephrosis. No hydroureter. Bladder normal. Stomach/Bowel: Hiatal hernia as above. Duodenum small-bowel normal. RIGHT hemicolectomy anatomy. Contrast flows into the transverse colon. The descending colon exits a LEFT lower quadrant ostomy. No small bowel enters hernia sac. No obstruction. More distal rectosigmoid colon is excluded. Vascular/Lymphatic: Abdominal aorta is normal caliber with atherosclerotic calcification. There is no retroperitoneal or periportal lymphadenopathy. No pelvic lymphadenopathy. Reproductive: Uterus normal. Other: No free fluid. Musculoskeletal: No aggressive osseous lesion. IMPRESSION: 1. LEFT lower quadrant colostomy without complication. 2. No evidence of colorectal carcinoma recurrence or metastasis. 3. Large hiatal hernia with the entire stomach above the diaphragm. Electronically Signed   By: Suzy Bouchard M.D.   On: 01/01/2018 15:11   01/01/2018 CT [facility]  IMPRESSION: 1. LEFT lower quadrant colostomy without complication. 2. No evidence  of colorectal carcinoma recurrence or metastasis. 3. Large hiatal hernia with the entire stomach above the diaphragm.  CT Chest WO Contrast 06/05/17 IMPRESSION: No evidence of metastatic disease or other acute findings within the thorax.  CT AP W Contrast 03/13/17 IMPRESSION: 1. Stable moderate to large parastomal hernia containing multiple small bowel loops in the ventral left abdominal wall at the end colostomy site. No evidence of acute bowel complication. 2. Large hiatal hernia. 3. Finely irregular liver surface, suggesting cirrhosis. Consider hepatic elastography for further liver fibrosis risk stratification, as clinically warranted. 4. Stable 1.0 cm cystic pancreatic neck lesion. Follow-up MRI abdomen without with IV contrast recommended in 2 years. This recommendation follows ACR consensus guidelines: Management of Incidental Pancreatic Cysts: A White Paper of the ACR Incidental Findings Committee. J Am Coll Radiol 3546;56:[id number]. 5. Stable 4 mm left UPJ stone.  No hydronephrosis. 6.  Aortic Atherosclerosis (ICD10-I70.0).  Coronary atherosclerosis.  ASSESSMENT & PLAN:  [name] 83 y.o. female is a here because of recently diagnosed invasive colorectal adenocarcinoma.   1. Right colon adenocarcinoma Stage IIIB (pT3, pN1A, cM0), MSI-stable  -I previously reviewed her CT scan findings, and surgical pathology results in great details with patient and her family members -She had a complete surgical resection with [name] on 05/07/17 and pathology results reveal a T3 lesion and 1/26 lymph nodes positive for metastatic carcinoma.  Margins were negative. -She had a CT AP in Dec, 2018 that showed no sign of metastasis. I ordered a CT Chest that showed no signs of lung metastases (March 2019).  -We previously discussed the risk of cancer recurrence after surgery due to her stage IIIB disease she is at moderate to high risk of  recurrence and the role of adjuvant chemo  -She began  adjuvant Xeloda 1500 BID on 06/26/17. She discontinued herself after 3 doses, she could not tolerate due to nausea and abdominal discomfort. She has fully recovered  -She is clinically doing very well, asymptomatic, exam was unremarkable. no clinical concern for recurrence.  -Her last CT AP was on 01/01/2018, and it revealed no disease recurrence. I reviewed the image myself and discussed the result with the patient. -Repeat scan next year in 8-12 months  -F/u in 4 months, continue colon cancer surveillance.  2. HTN, CAD, diastolic CHF, AF, CKD stage III -Continue follow-up with her primary care physician and her cardiologist -her Echo from 05/28/17 had a LV EF of 55-60% -Follow-up with her primary care physician and the cardiologist.  3. Iron deficient anemia -Iron study otherwise normal, mildly low Hgb, 10.5-11.5 range  -Her anemia has resolved now.  Repeat iron study showed Iron 30 and Saturation ratios 11. CBC showed Hg 11.9 -continue oral iron, her iron level was still slightly low on 01/01/18, she is not anemic   PLAN: -F/u in 4 months with labs   No orders of the defined types were placed in this encounter.   All questions were answered. The patient knows to call the clinic with any problems, questions or concerns. I spent 15 minutes counseling the patient face to face. The total time spent in the appointment was 20 minutes and more than 50% was on counseling.  Dierdre Searles Dweik am acting as scribe for Dr. Truitt Merle.  I have reviewed the above documentation for accuracy and completeness, and I agree with the above.      Truitt Merle, MD 01/05/2018 8:57 AM

## 2018-01-05 ENCOUNTER — Encounter: Payer: Self-pay | Admitting: Hematology

## 2018-01-05 ENCOUNTER — Inpatient Hospital Stay (HOSPITAL_BASED_OUTPATIENT_CLINIC_OR_DEPARTMENT_OTHER): Payer: Medicare Other | Admitting: Hematology

## 2018-01-05 ENCOUNTER — Telehealth: Payer: Self-pay

## 2018-01-05 VITALS — BP 147/61 | HR 51 | Temp 97.7°F | Resp 17 | Ht 62.5 in | Wt 152.4 lb

## 2018-01-05 DIAGNOSIS — C182 Malignant neoplasm of ascending colon: Secondary | ICD-10-CM | POA: Diagnosis not present

## 2018-01-05 DIAGNOSIS — D649 Anemia, unspecified: Secondary | ICD-10-CM

## 2018-01-05 DIAGNOSIS — C779 Secondary and unspecified malignant neoplasm of lymph node, unspecified: Secondary | ICD-10-CM | POA: Diagnosis not present

## 2018-01-05 DIAGNOSIS — Z79899 Other long term (current) drug therapy: Secondary | ICD-10-CM

## 2018-01-05 NOTE — Telephone Encounter (Signed)
Printed abs and calender of upcoming appointment. Per 10/7 los

## 2018-01-09 ENCOUNTER — Other Ambulatory Visit: Payer: Self-pay | Admitting: Surgery

## 2018-01-09 DIAGNOSIS — K449 Diaphragmatic hernia without obstruction or gangrene: Secondary | ICD-10-CM

## 2018-01-12 ENCOUNTER — Ambulatory Visit
Admission: RE | Admit: 2018-01-12 | Discharge: 2018-01-12 | Disposition: A | Discharge status: Home / Self Care | Payer: Medicare Other | Source: Ambulatory Visit | Attending: Surgery | Admitting: Surgery

## 2018-01-12 DIAGNOSIS — K449 Diaphragmatic hernia without obstruction or gangrene: Secondary | ICD-10-CM

## 2018-01-14 ENCOUNTER — Other Ambulatory Visit: Payer: Self-pay | Admitting: Physician Assistant

## 2018-01-14 DIAGNOSIS — I10 Essential (primary) hypertension: Secondary | ICD-10-CM

## 2018-01-15 NOTE — Telephone Encounter (Signed)
Last seen 12/26/17  [name]

## 2018-01-30 ENCOUNTER — Ambulatory Visit (INDEPENDENT_AMBULATORY_CARE_PROVIDER_SITE_OTHER): Payer: Medicare Other | Admitting: Physician Assistant

## 2018-01-30 ENCOUNTER — Encounter: Payer: Self-pay | Admitting: Physician Assistant

## 2018-01-30 VITALS — BP 133/78 | HR 59 | Temp 98.3°F | Ht 62.5 in | Wt 154.0 lb

## 2018-01-30 DIAGNOSIS — E039 Hypothyroidism, unspecified: Secondary | ICD-10-CM | POA: Diagnosis not present

## 2018-01-30 DIAGNOSIS — I10 Essential (primary) hypertension: Secondary | ICD-10-CM

## 2018-01-30 DIAGNOSIS — E7800 Pure hypercholesterolemia, unspecified: Secondary | ICD-10-CM

## 2018-01-30 MED ORDER — METOPROLOL SUCCINATE ER 25 MG PO TB24: ORAL_TABLET | ORAL | 3 refills | Status: DC

## 2018-01-30 MED ORDER — AMLODIPINE BESYLATE 5 MG PO TABS: ORAL_TABLET | ORAL | 3 refills | Status: DC

## 2018-01-30 MED ORDER — AZITHROMYCIN 250 MG PO TABS: ORAL_TABLET | ORAL | 0 refills | Status: DC

## 2018-01-30 MED ORDER — LORATADINE 10 MG PO TABS: 10.0000 mg | ORAL_TABLET | Freq: Every day | ORAL | 3 refills | Status: AC

## 2018-01-30 MED ORDER — ALPRAZOLAM 0.25 MG PO TABS: ORAL_TABLET | ORAL | 1 refills | Status: AC

## 2018-01-30 MED ORDER — ZOLPIDEM TARTRATE 10 MG PO TABS: 10.0000 mg | ORAL_TABLET | Freq: Every day | ORAL | 1 refills | Status: DC

## 2018-01-30 MED ORDER — HYDRALAZINE HCL 50 MG PO TABS: ORAL_TABLET | ORAL | 3 refills | Status: AC

## 2018-01-30 MED ORDER — LOSARTAN POTASSIUM 100 MG PO TABS: ORAL_TABLET | ORAL | 3 refills | Status: AC

## 2018-01-30 MED ORDER — MONTELUKAST SODIUM 10 MG PO TABS: 10.0000 mg | ORAL_TABLET | Freq: Every day | ORAL | 3 refills | Status: AC

## 2018-01-30 MED ORDER — CITALOPRAM HYDROBROMIDE 40 MG PO TABS: ORAL_TABLET | ORAL | 3 refills | Status: AC

## 2018-01-30 MED ORDER — ESOMEPRAZOLE MAGNESIUM 40 MG PO CPDR: DELAYED_RELEASE_CAPSULE | ORAL | 3 refills | Status: AC

## 2018-01-30 MED ORDER — TRAZODONE HCL 100 MG PO TABS: 100.0000 mg | ORAL_TABLET | Freq: Every day | ORAL | 3 refills | Status: DC

## 2018-01-30 MED ORDER — ROSUVASTATIN CALCIUM 10 MG PO TABS: ORAL_TABLET | ORAL | 3 refills | Status: AC

## 2018-01-30 MED ORDER — ISOSORBIDE MONONITRATE ER 30 MG PO TB24: 30.0000 mg | ORAL_TABLET | Freq: Every morning | ORAL | 3 refills | Status: AC

## 2018-01-30 MED ORDER — LEVOTHYROXINE SODIUM 50 MCG PO TABS: ORAL_TABLET | ORAL | 3 refills | Status: AC

## 2018-01-31 LAB — CMP14+EGFR
A/G RATIO: 1.7 (ref 1.2–2.2)
ALBUMIN: 4 g/dL (ref 3.5–4.7)
ALK PHOS: 73 IU/L (ref 39–117)
ALT: 8 IU/L (ref 0–32)
AST: 14 IU/L (ref 0–40)
BILIRUBIN TOTAL: 0.3 mg/dL (ref 0.0–1.2)
BUN / CREAT RATIO: 9 — AB (ref 12–28)
BUN: 15 mg/dL (ref 8–27)
CHLORIDE: 102 mmol/L (ref 96–106)
CO2: 24 mmol/L (ref 20–29)
Calcium: 9.6 mg/dL (ref 8.7–10.3)
Creatinine, Ser: 1.62 mg/dL — ABNORMAL HIGH (ref 0.57–1.00)
GFR calc Af Amer: 34 mL/min/{1.73_m2} — ABNORMAL LOW (ref >59)
GFR calc non Af Amer: 30 mL/min/{1.73_m2} — ABNORMAL LOW (ref >59)
GLOBULIN, TOTAL: 2.4 g/dL (ref 1.5–4.5)
Glucose: 98 mg/dL (ref 65–99)
POTASSIUM: 4.2 mmol/L (ref 3.5–5.2)
SODIUM: 141 mmol/L (ref 134–144)
TOTAL PROTEIN: 6.4 g/dL (ref 6.0–8.5)

## 2018-01-31 LAB — CBC WITH DIFFERENTIAL/PLATELET
BASOS ABS: 0.1 10*3/uL (ref 0.0–0.2)
Basos: 1 %
EOS (ABSOLUTE): 0.2 10*3/uL (ref 0.0–0.4)
Eos: 2 %
HEMOGLOBIN: 12.2 g/dL (ref 11.1–15.9)
Hematocrit: 39.1 % (ref 34.0–46.6)
Immature Grans (Abs): 0 10*3/uL (ref 0.0–0.1)
Immature Granulocytes: 0 %
LYMPHS ABS: 1.5 10*3/uL (ref 0.7–3.1)
Lymphs: 14 %
MCH: 26.5 pg — AB (ref 26.6–33.0)
MCHC: 31.2 g/dL — AB (ref 31.5–35.7)
MCV: 85 fL (ref 79–97)
MONOCYTES: 7 %
MONOS ABS: 0.7 10*3/uL (ref 0.1–0.9)
Neutrophils Absolute: 8.3 10*3/uL — ABNORMAL HIGH (ref 1.4–7.0)
Neutrophils: 76 %
Platelets: 206 10*3/uL (ref 150–450)
RBC: 4.6 x10E6/uL (ref 3.77–5.28)
RDW: 14.1 % (ref 12.3–15.4)
WBC: 10.9 10*3/uL — ABNORMAL HIGH (ref 3.4–10.8)

## 2018-01-31 LAB — LIPID PANEL
CHOLESTEROL TOTAL: 125 mg/dL (ref 100–199)
Chol/HDL Ratio: 2.5 ratio (ref 0.0–4.4)
HDL: 51 mg/dL (ref >39)
LDL CALC: 50 mg/dL (ref 0–99)
Triglycerides: 122 mg/dL (ref 0–149)
VLDL Cholesterol Cal: 24 mg/dL (ref 5–40)

## 2018-01-31 LAB — TSH: TSH: 2.78 u[IU]/mL (ref 0.450–4.500)

## 2018-02-02 ENCOUNTER — Ambulatory Visit (INDEPENDENT_AMBULATORY_CARE_PROVIDER_SITE_OTHER): Payer: Medicare Other

## 2018-02-02 ENCOUNTER — Encounter: Payer: Self-pay | Admitting: *Deleted

## 2018-02-02 ENCOUNTER — Ambulatory Visit (INDEPENDENT_AMBULATORY_CARE_PROVIDER_SITE_OTHER): Payer: Medicare Other | Admitting: *Deleted

## 2018-02-02 VITALS — BP 124/67 | HR 53 | Ht 62.5 in | Wt 154.0 lb

## 2018-02-02 DIAGNOSIS — Z0000 Encounter for general adult medical examination without abnormal findings: Secondary | ICD-10-CM

## 2018-02-02 DIAGNOSIS — Z780 Asymptomatic menopausal state: Secondary | ICD-10-CM

## 2018-02-02 NOTE — Patient Instructions (Addendum)
Please work on your goal of having something to eat 3 times per day.  Yogurt, cheese toast, fruit, vegetables, lean proteins, and whole grains as tolerated are great options.   At your convenience, please bring a copy of your healthcare power of attorney, and living will to our office to be filed in your medical record.    Remember to get your 2nd Shingrix vaccine after 02/23/18.  Thank you for coming in for your Annual Wellness Visit today!!  Preventive Care 81 Years and Older, Female Preventive care refers to lifestyle choices and visits with your health care provider that can promote health and wellness. What does preventive care include?  A yearly physical exam. This is also called an annual well check.  Dental exams once or twice a year.  Routine eye exams. Ask your health care provider how often you should have your eyes checked.  Personal lifestyle choices, including: ? Daily care of your teeth and gums. ? Regular physical activity. ? Eating a healthy diet. ? Avoiding tobacco and drug use. ? Limiting alcohol use. ? Practicing safe sex. ? Taking low-dose aspirin every day. ? Taking vitamin and mineral supplements as recommended by your health care provider. What happens during an annual well check? The services and screenings done by your health care provider during your annual well check will depend on your age, overall health, lifestyle risk factors, and family history of disease. Counseling Your health care provider may ask you questions about your:  Alcohol use.  Tobacco use.  Drug use.  Emotional well-being.  Home and relationship well-being.  Sexual activity.  Eating habits.  History of falls.  Memory and ability to understand (cognition).  Work and work Statistician.  Reproductive health.  Screening You may have the following tests or measurements:  Height, weight, and BMI.  Blood pressure.  Lipid and cholesterol levels. These may be checked  every 5 years, or more frequently if you are over 35 years old.  Skin check.  Lung cancer screening. You may have this screening every year starting at age 57 if you have a 30-pack-year history of smoking and currently smoke or have quit within the past 15 years.  Fecal occult blood test (FOBT) of the stool. You may have this test every year starting at age 1.  Flexible sigmoidoscopy or colonoscopy. You may have a sigmoidoscopy every 5 years or a colonoscopy every 10 years starting at age 22.  Hepatitis C blood test.  Hepatitis B blood test.  Sexually transmitted disease (STD) testing.  Diabetes screening. This is done by checking your blood sugar (glucose) after you have not eaten for a while (fasting). You may have this done every 1-3 years.  Bone density scan. This is done to screen for osteoporosis. You may have this done starting at age 60.  Mammogram. This may be done every 1-2 years. Talk to your health care provider about how often you should have regular mammograms.  Talk with your health care provider about your test results, treatment options, and if necessary, the need for more tests. Vaccines Your health care provider may recommend certain vaccines, such as:  Influenza vaccine. This is recommended every year.  Tetanus, diphtheria, and acellular pertussis (Tdap, Td) vaccine. You may need a Td booster every 10 years.  Varicella vaccine. You may need this if you have not been vaccinated.  Zoster vaccine. You may need this after age 27.  Measles, mumps, and rubella (MMR) vaccine. You may need at least one  dose of MMR if you were born in 1957 or later. You may also need a second dose.  Pneumococcal 13-valent conjugate (PCV13) vaccine. One dose is recommended after age 31.  Pneumococcal polysaccharide (PPSV23) vaccine. One dose is recommended after age 54.  Meningococcal vaccine. You may need this if you have certain conditions.  Hepatitis A vaccine. You may need  this if you have certain conditions or if you travel or work in places where you may be exposed to hepatitis A.  Hepatitis B vaccine. You may need this if you have certain conditions or if you travel or work in places where you may be exposed to hepatitis B.  Haemophilus influenzae type b (Hib) vaccine. You may need this if you have certain conditions.  Talk to your health care provider about which screenings and vaccines you need and how often you need them. This information is not intended to replace advice given to you by your health care provider. Make sure you discuss any questions you have with your health care provider. Document Released: 04/14/2015 Document Revised: 12/06/2015 Document Reviewed: 01/17/2015 Elsevier Interactive Patient Education  [employer].

## 2018-02-02 NOTE — Progress Notes (Signed)
BP 133/78   Pulse (!) 59   Temp 98.3 F (36.8 C) (Oral)   Ht 5' 2.5" (1.588 m)   Wt 154 lb (69.9 kg)   BMI 27.72 kg/m    Subjective:    Patient ID: [name], female    DOB: 04-26-1934, 83 y.o.   MRN: [id number]  HPI: [name] is a 83 y.o. female presenting on 01/30/2018 for Medical Management of Chronic Issues  This patient comes in for a periodic recheck on her medical conditions.  She is being followed by surgery, gastroenterology, oncology.  They are keeping a watch on her colon cancer.  Her hiatal hernia had gotten worse.  Dr. Hilarie Fredrickson will be seeing her for that.  The surgical hernia is not one that they want to repair because the risk are too great.  She does have hypercholesterolemia, hypertension, hypothyroidism.  We will update labs and medications today.  She has no other complaints at this time.  Past Medical History:  Diagnosis Date  . Anemia due to blood loss, chronic   . Anxiety state, unspecified   . Atrophy of left kidney   . CKD (chronic kidney disease), stage III ([place])   . Coronary artery disease cardiologist-  dr Bronson Ing   a. Moderate to severe coronary disease involving the left anterior descending artery and right coronary artery.  Sequential stenosis in the LAD is significant.  Right coronary artery is moderate to severely likely nonischemic. Questionable small LVOT obstruction.  No Brockenbrough maneuver was performed.  Catheterization January 2013 w/ PTCA and DES x1 to pLAD b. 05/2014 MV no isch/infarct, EF 60%.  . Depressive disorder, not elsewhere classified   . Diastolic CHF, chronic [facility])    cardiologist-  dr Bronson Ing  . GERD (gastroesophageal reflux disease)   . H/O hiatal hernia   . History of [name]'s esophagus   . History of melanoma excision    back  . Hypertension   . Hypothyroidism   . Macular degeneration    both eyes  . PAF (paroxysmal atrial fibrillation) ([place])   . PONV (postoperative nausea and vomiting)   . Pulmonary hypertension  ([place])    PA systolic [id number] mmHg by echocardiogram 06-17-2015  PA pressure 05-08-2011 by cardiac catheterization -- no sig. pulm. htn)PA saturation 62% thermodilution cardiac index 2.0 thick cardiac index 2.4  . Pure hypercholesterolemia   . S/P drug eluting coronary stent placement 05/08/2011   x1 DES to proximal LAD  . Wears dentures    lower  . Wears glasses    Relevant past medical, surgical, family and social history reviewed and updated as indicated. Interim medical history since our last visit reviewed. Allergies and medications reviewed and updated. DATA REVIEWED: CHART IN EPIC  Family History reviewed for pertinent findings.  Review of Systems  Constitutional: Negative.   HENT: Negative.   Eyes: Negative.   Respiratory: Negative.   Gastrointestinal: Negative.   Genitourinary: Negative.     Allergies as of 01/30/2018      Reactions   Sulfamethoxazole Rash      Medication List        Accurate as of 01/30/18 11:59 PM. Always use your most recent med list.          albuterol 108 (90 Base) MCG/ACT inhaler Commonly known as:  PROVENTIL HFA;VENTOLIN HFA Inhale 2 puffs into the lungs every 6 (six) hours as needed for wheezing or shortness of breath.   ALPRAZolam 0.25 MG tablet Commonly known as:  Duanne Moron  TAKEK 1/2 TO 1 TABLET 3 TIMES DAILY AS NEEDED   amLODipine 5 MG tablet Commonly known as:  NORVASC TAKE ONE (1) TABLET EACH DAY   azithromycin 250 MG tablet Commonly known as:  ZITHROMAX Take as directed   citalopram 40 MG tablet Commonly known as:  CELEXA TAKE ONE (1) TABLET EACH DAY   esomeprazole 40 MG capsule Commonly known as:  NEXIUM TAKE ONE (1) CAPSULE EACH DAY   ferrous sulfate 325 (65 FE) MG EC tablet Take 325 mg by mouth daily with breakfast.   fluticasone 50 MCG/ACT nasal spray Commonly known as:  FLONASE USE 2 SPRAYS IN EACH NOSTRIL DAILY   hydrALAZINE 50 MG tablet Commonly known as:  APRESOLINE TAKE ONE (1) TABLET THREE (3) TIMES  EACH DAY   isosorbide mononitrate 30 MG 24 hr tablet Commonly known as:  IMDUR Take 1 tablet (30 mg total) by mouth every morning.   levothyroxine 50 MCG tablet Commonly known as:  SYNTHROID, LEVOTHROID TAKE ONE TABLET EACH MORNING BEFORE BREAKFAST   loratadine 10 MG tablet Commonly known as:  CLARITIN Take 1 tablet (10 mg total) by mouth at bedtime.   losartan 100 MG tablet Commonly known as:  COZAAR TAKE ONE (1) TABLET EACH DAY   Meclizine HCl 25 MG Chew TAKE 1 TABLET 3 TIMES DAILY AS NEEDED FOR DIZZINESS   metoprolol succinate 25 MG 24 hr tablet Commonly known as:  TOPROL-XL Take 25 mg by mouth once daily at night   montelukast 10 MG tablet Commonly known as:  SINGULAIR Take 1 tablet (10 mg total) by mouth at bedtime.   nitroGLYCERIN 0.4 MG SL tablet Commonly known as:  NITROSTAT Place 1 tablet (0.4 mg total) under the tongue every 5 (five) minutes as needed for chest pain. Reported on 04/26/2015   ondansetron 8 MG tablet Commonly known as:  ZOFRAN Take 1 tablet (8 mg total) by mouth every 8 (eight) hours as needed for nausea or vomiting.   polyethylene glycol powder powder Commonly known as:  GLYCOLAX/MIRALAX MIX 17 GRAMS IN 80Z OF WATER/JUICE AND DRINK TWICE DAILY   potassium chloride 10 MEQ tablet Commonly known as:  K-DUR TAKE ONE (1) TABLET EACH DAY   rosuvastatin 10 MG tablet Commonly known as:  CRESTOR TAKE ONE (1) TABLET EACH DAY   SYSTANE BALANCE OP Apply 1 drop to eye daily as needed (dry eyes).   traMADol 50 MG tablet Commonly known as:  ULTRAM Take 1 tablet (50 mg total) by mouth every 12 (twelve) hours as needed.   traZODone 100 MG tablet Commonly known as:  DESYREL Take 1 tablet (100 mg total) by mouth at bedtime.   Vitamin D (Ergocalciferol) 50000 units Caps capsule Commonly known as:  DRISDOL TAKE 1 CAPSULE ON TUESDAYS   zolpidem 10 MG tablet Commonly known as:  AMBIEN Take 1 tablet (10 mg total) by mouth at bedtime.            Objective:    BP 133/78   Pulse (!) 59   Temp 98.3 F (36.8 C) (Oral)   Ht 5' 2.5" (1.588 m)   Wt 154 lb (69.9 kg)   BMI 27.72 kg/m   Allergies  Allergen Reactions  . Sulfamethoxazole Rash    Wt Readings from Last 3 Encounters:  02/02/18 154 lb (69.9 kg)  01/30/18 154 lb (69.9 kg)  01/05/18 152 lb 6.4 oz (69.1 kg)    Physical Exam  Constitutional: She is oriented to person, place, and time. She appears  well-developed and well-nourished.  HENT:  Head: Normocephalic and atraumatic.  Eyes: Pupils are equal, round, and reactive to light. Conjunctivae and EOM are normal.  Cardiovascular: Normal rate, regular rhythm, normal heart sounds and intact distal pulses.  Pulmonary/Chest: Effort normal and breath sounds normal.  Abdominal: Soft. Bowel sounds are normal.  Neurological: She is alert and oriented to person, place, and time. She has normal reflexes.  Skin: Skin is warm and dry. No rash noted.  Psychiatric: She has a normal mood and affect. Her behavior is normal. Judgment and thought content normal.    Results for orders placed or performed in visit on 01/30/18  CMP14+EGFR  Result Value Ref Range   Glucose 98 65 - 99 mg/dL   BUN 15 8 - 27 mg/dL   Creatinine, Ser 1.62 (H) 0.57 - 1.00 mg/dL   GFR calc non Af Amer 30 (L) >59 mL/min/1.73   GFR calc Af Amer 34 (L) >59 mL/min/1.73   BUN/Creatinine Ratio 9 (L) 12 - 28   Sodium 141 134 - 144 mmol/L   Potassium 4.2 3.5 - 5.2 mmol/L   Chloride 102 96 - 106 mmol/L   CO2 24 20 - 29 mmol/L   Calcium 9.6 8.7 - 10.3 mg/dL   Total Protein 6.4 6.0 - 8.5 g/dL   Albumin 4.0 3.5 - 4.7 g/dL   Globulin, Total 2.4 1.5 - 4.5 g/dL   Albumin/Globulin Ratio 1.7 1.2 - 2.2   Bilirubin Total 0.3 0.0 - 1.2 mg/dL   Alkaline Phosphatase 73 39 - 117 IU/L   AST 14 0 - 40 IU/L   ALT 8 0 - 32 IU/L  CBC with Differential/Platelet  Result Value Ref Range   WBC 10.9 (H) 3.4 - 10.8 x10E3/uL   RBC 4.60 3.77 - 5.28 x10E6/uL   Hemoglobin 12.2 11.1 -  15.9 g/dL   Hematocrit 39.1 34.0 - 46.6 %   MCV 85 79 - 97 fL   MCH 26.5 (L) 26.6 - 33.0 pg   MCHC 31.2 (L) 31.5 - 35.7 g/dL   RDW 14.1 12.3 - 15.4 %   Platelets 206 150 - 450 x10E3/uL   Neutrophils 76 Not Estab. %   Lymphs 14 Not Estab. %   Monocytes 7 Not Estab. %   Eos 2 Not Estab. %   Basos 1 Not Estab. %   Neutrophils Absolute 8.3 (H) 1.4 - 7.0 x10E3/uL   Lymphocytes Absolute 1.5 0.7 - 3.1 x10E3/uL   Monocytes Absolute 0.7 0.1 - 0.9 x10E3/uL   EOS (ABSOLUTE) 0.2 0.0 - 0.4 x10E3/uL   Basophils Absolute 0.1 0.0 - 0.2 x10E3/uL   Immature Granulocytes 0 Not Estab. %   Immature Grans (Abs) 0.0 0.0 - 0.1 x10E3/uL  Lipid panel  Result Value Ref Range   Cholesterol, Total 125 100 - 199 mg/dL   Triglycerides 122 0 - 149 mg/dL   HDL 51 >39 mg/dL   VLDL Cholesterol Cal 24 5 - 40 mg/dL   LDL Calculated 50 0 - 99 mg/dL   Chol/HDL Ratio 2.5 0.0 - 4.4 ratio  TSH  Result Value Ref Range   TSH 2.780 0.450 - 4.500 uIU/mL      Assessment & Plan:   1. HYPERCHOLESTEROLEMIA - CMP14+EGFR - Lipid panel  2. Essential hypertension - CMP14+EGFR - CBC with Differential/Platelet - Lipid panel - TSH - amLODipine (NORVASC) 5 MG tablet; TAKE ONE (1) TABLET EACH DAY  Dispense: 90 tablet; Refill: 3 - hydrALAZINE (APRESOLINE) 50 MG tablet; TAKE ONE (1) TABLET THREE (  3) TIMES EACH DAY  Dispense: 270 tablet; Refill: 3 - losartan (COZAAR) 100 MG tablet; TAKE ONE (1) TABLET EACH DAY  Dispense: 90 tablet; Refill: 3  3. Acquired hypothyroidism - CMP14+EGFR - CBC with Differential/Platelet - Lipid panel - TSH   Continue all other maintenance medications as listed above.  Follow up plan: Return in about 4 months (around 05/31/2018) for recheck.  Educational handout given for [place] PA-C [place] [address]  [place], [place] [address] [phone]   02/02/2018, 1:33 PM

## 2018-02-02 NOTE — Progress Notes (Signed)
Subjective:   [name] is a 83 y.o. female who presents for Medicare Annual (Subsequent) preventive examination.  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  She enjoys going to church, exercising, watching sporting events, and spending time with friends and family.  She is divorced and lives alone.  [name] has 3 children, 8 grandchildren, and 4 great grandchildren.  She has had no ER visits this year.  She had one surgery for colectomy and parastomal hernia repair this year, and was diagnosed with stage III colon cancer.  She stayed in the hospital for about one week after the surgery.  [name] states she feels her health this year is better than last year because she is recovered from her colectomy and feels better with more energy in general.   Review of Systems:   GI- abdominal pain - chronic All other systems negative         Objective:     Vitals: BP 124/67   Pulse (!) 53   Ht 5' 2.5" (1.588 m)   Wt 154 lb (69.9 kg)   BMI 27.72 kg/m   Body mass index is 27.72 kg/m.  Advanced Directives 02/02/2018 05/07/2017 05/05/2017 01/21/2017 07/18/2016 07/15/2016 05/31/2016  Does Patient Have a Medical Advance Directive? Yes Yes No Yes Yes Yes Yes  Type of Paramedic of [place];Living will Living will - [place];Living will Living will Living will Living will  Does patient want to make changes to medical advance directive? No - Patient declined No - Patient declined - No - Patient declined No - Patient declined No - Patient declined No - Patient declined  Copy of [place] in Chart? No - copy requested - - No - copy requested - - -  Would patient like information on creating a medical advance directive? No - Patient declined No - Patient declined - - - - No - Patient declined  Pre-existing out of facility DNR order (yellow form or pink MOST  form) - - - - - - -    Tobacco Social History   Tobacco Use  Smoking Status Former Smoker  . Packs/day: 1.00  . Years: 20.00  . Pack years: 20.00  . Types: Cigarettes  . Start date: 10/12/1958  . Last attempt to quit: 04/26/1988  . Years since quitting: 29.7  Smokeless Tobacco Never Used     Counseling given: No   Clinical Intake:     Pain Score: 7                  Past Medical History:  Diagnosis Date  . Anemia due to blood loss, chronic   . Anxiety state, unspecified   . Atrophy of left kidney   . CKD (chronic kidney disease), stage III ([place])   . Coronary artery disease cardiologist-  dr Bronson Ing   a. Moderate to severe coronary disease involving the left anterior descending artery and right coronary artery.  Sequential stenosis in the LAD is significant.  Right coronary artery is moderate to severely likely nonischemic. Questionable small LVOT obstruction.  No Brockenbrough maneuver was performed.  Catheterization January 2013 w/ PTCA and DES x1 to pLAD b. 05/2014 MV no isch/infarct, EF 60%.  . Depressive disorder, not elsewhere classified   . Diastolic CHF, chronic [facility])    cardiologist-  dr Bronson Ing  . GERD (gastroesophageal reflux disease)   .  H/O hiatal hernia   . History of [name]'s esophagus   . History of melanoma excision    back  . Hypertension   . Hypothyroidism   . Macular degeneration    both eyes  . PAF (paroxysmal atrial fibrillation) ([place])   . PONV (postoperative nausea and vomiting)   . Pulmonary hypertension ([place])    PA systolic [id number] mmHg by echocardiogram 06-17-2015  PA pressure 05-08-2011 by cardiac catheterization -- no sig. pulm. htn)PA saturation 62% thermodilution cardiac index 2.0 thick cardiac index 2.4  . Pure hypercholesterolemia   . S/P drug eluting coronary stent placement 05/08/2011   x1 DES to proximal LAD  . Wears dentures    lower  . Wears glasses    Past Surgical History:  Procedure Laterality Date  .  CARDIAC CATHETERIZATION Left 03/05/2015   Procedure: CENTRAL LINE INSERTION;  Surgeon: Aviva Signs, MD;  Location: [facility];  Service: General;  Laterality: Left;  . CATARACT EXTRACTION W/ INTRAOCULAR LENS  IMPLANT, BILATERAL  ~ 2002  . CHOLECYSTECTOMY N/A 07/18/2016   Procedure: LAPAROSCOPIC CHOLECYSTECTOMY WITH INTRAOPERATIVE CHOLANGIOGRAM;  Surgeon: [name], MD;  Location: [facility];  Service: General;  Laterality: N/A;  . COLECTOMY WITH COLOSTOMY CREATION/HARTMANN PROCEDURE N/A 03/05/2015   Procedure: PARTIAL COLECTOMY WITH COLOSTOMY CREATION/HARTMANN PROCEDURE;  Surgeon: Aviva Signs, MD;  Location: [facility];  Service: General;  Laterality: N/A;  . ESOPHAGEAL MANOMETRY N/A 06/06/2014   Procedure: ESOPHAGEAL MANOMETRY (EM);  Surgeon: Jerene Bears, MD;  Location: [facility];  Service: Gastroenterology;  Laterality: N/A;  . ESOPHAGOGASTRODUODENOSCOPY (EGD) WITH PROPOFOL N/A 08/04/2015   Procedure: ESOPHAGOGASTRODUODENOSCOPY (EGD) WITH PROPOFOL;  Surgeon: Mauri Pole, MD;  Location: [facility];  Service: Endoscopy;  Laterality: N/A;  . HERNIA REPAIR    . IR GENERIC HISTORICAL  03/04/2016   IR LUMBAR DISC ASPIRATION W/IMG GUIDE 03/04/2016 Luanne Bras, MD [facility]  . LAPAROSCOPIC NISSEN FUNDOPLICATION N/A 05/03/5425   Procedure: LAPAROSCOPIC  LYSIS OF ADHESIONS, SEGMENTAL GASTRECTOMY, PARTIAL REDUCTION OF HERNIA;  Surgeon: [name], MD;  Location: [facility];  Service: General;  Laterality: N/A;  . LAPAROSCOPIC PARTIAL COLECTOMY N/A 05/07/2017   Procedure: LAPAROSCOPIC  CONVERTED TO OPEN RIGHT COLECTOMY, PARASTOMAL HERNIA  REPAIR;  Surgeon: Leighton Ruff, MD;  Location: [facility];  Service: General;  Laterality: N/A;  . NISSEN FUNDOPLICATION  0623  . PERCUTANEOUS CORONARY STENT INTERVENTION (PCI-S) N/A 05/08/2011   Procedure: PERCUTANEOUS CORONARY STENT INTERVENTION (PCI-S);  Surgeon: Wellington Hampshire, MD;  Location: [facility] CATH LAB;  Service: Cardiovascular;  Laterality: N/A;  . ROTATOR  CUFF REPAIR  ~ 2001   right  . SHOULDER ARTHROSCOPY  ~ 2004; 2005   left; "joint's wore out"  . TONSILLECTOMY  ~ 1944  . TUBAL LIGATION  1972   Family History  Problem Relation Age of Onset  . Heart attack Father   . Hypertension Father   . Hypertension Mother   . Skin cancer Brother        melanoma  . Atrial fibrillation Sister   . Skin cancer Sister   . Colon cancer Neg Hx   . Esophageal cancer Neg Hx   . Rectal cancer Neg Hx   . Stomach cancer Neg Hx    Social History   Socioeconomic History  . Marital status: Divorced    Spouse name: Not on file  . Number of children: 3  . Years of education: Not on file  . Highest education level: Not on file  Occupational History  . Occupation:  Retired  Scientific laboratory technician  . Financial resource strain: Not on file  . Food insecurity:    Worry: Not on file    Inability: Not on file  . Transportation needs:    Medical: Not on file    Non-medical: Not on file  Tobacco Use  . Smoking status: Former Smoker    Packs/day: 1.00    Years: 20.00    Pack years: 20.00    Types: Cigarettes    Start date: 10/12/1958    Last attempt to quit: 04/26/1988    Years since quitting: 29.7  . Smokeless tobacco: Never Used  Substance and Sexual Activity  . Alcohol use: No    Alcohol/week: 0.0 standard drinks  . Drug use: No  . Sexual activity: Never  Lifestyle  . Physical activity:    Days per week: Not on file    Minutes per session: Not on file  . Stress: Not on file  Relationships  . Social connections:    Talks on phone: Not on file    Gets together: Not on file    Attends religious service: Not on file    Active member of club or organization: Not on file    Attends meetings of clubs or organizations: Not on file    Relationship status: Not on file  Other Topics Concern  . Not on file  Social History Narrative   Divorced.  Lives alone.  Ambulates with a walker.     Outpatient Encounter Medications as of 02/02/2018  Medication Sig    . albuterol (PROVENTIL HFA;VENTOLIN HFA) 108 (90 Base) MCG/ACT inhaler Inhale 2 puffs into the lungs every 6 (six) hours as needed for wheezing or shortness of breath.   . ALPRAZolam (XANAX) 0.25 MG tablet TAKEK 1/2 TO 1 TABLET 3 TIMES DAILY AS NEEDED  . amLODipine (NORVASC) 5 MG tablet TAKE ONE (1) TABLET EACH DAY  . azithromycin (ZITHROMAX Z-PAK) 250 MG tablet Take as directed  . citalopram (CELEXA) 40 MG tablet TAKE ONE (1) TABLET EACH DAY  . esomeprazole (NEXIUM) 40 MG capsule TAKE ONE (1) CAPSULE EACH DAY  . ferrous sulfate 325 (65 FE) MG EC tablet Take 325 mg by mouth daily with breakfast.   . fluticasone (FLONASE) 50 MCG/ACT nasal spray USE 2 SPRAYS IN EACH NOSTRIL DAILY  . hydrALAZINE (APRESOLINE) 50 MG tablet TAKE ONE (1) TABLET THREE (3) TIMES EACH DAY  . isosorbide mononitrate (IMDUR) 30 MG 24 hr tablet Take 1 tablet (30 mg total) by mouth every morning.  Marland Kitchen levothyroxine (SYNTHROID, LEVOTHROID) 50 MCG tablet TAKE ONE TABLET EACH MORNING BEFORE BREAKFAST  . loratadine (CLARITIN) 10 MG tablet Take 1 tablet (10 mg total) by mouth at bedtime.  Marland Kitchen losartan (COZAAR) 100 MG tablet TAKE ONE (1) TABLET EACH DAY  . Meclizine HCl 25 MG CHEW TAKE 1 TABLET 3 TIMES DAILY AS NEEDED FOR DIZZINESS  . metoprolol succinate (TOPROL-XL) 25 MG 24 hr tablet Take 25 mg by mouth once daily at night  . montelukast (SINGULAIR) 10 MG tablet Take 1 tablet (10 mg total) by mouth at bedtime.  . nitroGLYCERIN (NITROSTAT) 0.4 MG SL tablet Place 1 tablet (0.4 mg total) under the tongue every 5 (five) minutes as needed for chest pain. Reported on 04/26/2015  . ondansetron (ZOFRAN) 8 MG tablet Take 1 tablet (8 mg total) by mouth every 8 (eight) hours as needed for nausea or vomiting.  . polyethylene glycol powder (GAVILAX) powder MIX 17 GRAMS IN 80Z OF WATER/JUICE AND DRINK  TWICE DAILY  . potassium chloride (K-DUR) 10 MEQ tablet TAKE ONE (1) TABLET EACH DAY (Patient taking differently: Take 10 mEq by mouth once as needed.  )  . Propylene Glycol (SYSTANE BALANCE OP) Apply 1 drop to eye daily as needed (dry eyes).  . rosuvastatin (CRESTOR) 10 MG tablet TAKE ONE (1) TABLET EACH DAY  . traMADol (ULTRAM) 50 MG tablet Take 1 tablet (50 mg total) by mouth every 12 (twelve) hours as needed.  . traZODone (DESYREL) 100 MG tablet Take 1 tablet (100 mg total) by mouth at bedtime.  . Vitamin D, Ergocalciferol, (DRISDOL) 50000 units CAPS capsule TAKE 1 CAPSULE ON TUESDAYS  . zolpidem (AMBIEN) 10 MG tablet Take 1 tablet (10 mg total) by mouth at bedtime.   No facility-administered encounter medications on file as of 02/02/2018.     Activities of Daily Living In your present state of health, do you have any difficulty performing the following activities: 02/02/2018 05/07/2017  Hearing? N N  Vision? Y Y  Comment trouble seeing in the dark or when it is raining -  Difficulty concentrating or making decisions? N N  Walking or climbing stairs? N N  Dressing or bathing? N N  Doing errands, shopping? N N  Preparing Food and eating ? N -  Using the Toilet? N -  In the past six months, have you accidently leaked urine? N -  Do you have problems with loss of bowel control? N -  Managing your Medications? N -  Managing your Finances? N -  Housekeeping or managing your Housekeeping? Y -  Comment Has help cleaning her house once per month -  Some recent data might be hidden    Patient Care Team: Terald Sleeper, PA-C as PCP - General ([place]) Okey Regal, [place] (Optometry) Herminio Commons, MD as Attending Physician (Cardiology) Truitt Merle, MD as Consulting Physician (Hematology) Leighton Ruff, MD as Consulting Physician (General Surgery) Michael Boston, MD as Consulting Physician (General Surgery) Dorena Dew, MD as Referring Physician (Surgery) Pyrtle, Lajuan Lines, MD as Consulting Physician (Gastroenterology) Michael Boston, MD as Consulting Physician (General Surgery) Fran Lowes, MD as Consulting  Physician (Nephrology)    Assessment:   This is a routine wellness examination for The[place]ia.  Exercise Activities and Dietary recommendations  Patient states she usually eats 1-2 times per day, and eats her largest meal in the evenings.  She states around 8 pm each day is when her stomach feels the most settled, and she has the most appetite.  She tries to avoid fried foods and sweets, but eats them on occasion.  Vegetables and fruits are harder for her to digest, but she tries to eat them as tolerated.  Recommended that she try to eat something 3 times per day- small amounts such as yogurt, toast with cheese or peanut butter, fruits, vegetables, and lean proteins as tolerated.  She has access all the foods she needs.   Current Exercise Habits: Structured exercise class, Type of exercise: strength training/weights;calisthenics;Other - see comments(Line dancing), Time (Minutes): 60, Frequency (Times/Week): 4, Weekly Exercise (Minutes/Week): 240, Intensity: Moderate  Goes to [employer] 3 times per week for toning and cardio classes, and goes on Tuesdays for a line dancing class.    Goals    . Eat 3 times per day     Have something to eat 3 times per day.  Yogurt, cheese toast, fruit, vegetables, lean proteins, and whole grains as tolerated.     Marland Kitchen  Exercise 150 minutes per week (moderate activity)       Fall Risk Fall Risk  02/02/2018 01/30/2018 12/26/2017 12/10/2017 07/29/2017  Falls in the past year? 0 0 No No No   Is the patient's home free of loose throw rugs in walkways, pet beds, electrical cords, etc?   yes      Grab bars in the bathroom? yes      Handrails on the stairs?   no stairs in home      Adequate lighting?   yes    Depression Screen PHQ 2/9 Scores 02/02/2018 01/30/2018 12/26/2017 12/10/2017  PHQ - 2 Score 0 0 0 1     Cognitive Function MMSE - Mini Mental State Exam 02/02/2018 01/21/2017  Orientation to time 5 5  Orientation to Place 5 5  Registration 3  3  Attention/ Calculation 5 5  Recall 2 3  Language- name 2 objects 2 2  Language- repeat 1 1  Language- follow 3 step command 3 3  Language- read & follow direction 1 1  Write a sentence 1 1  Copy design 1 0  Total score 29 29        Immunization History  Administered Date(s) Administered  . Influenza Whole 01/08/2014  . Influenza, High Dose Seasonal PF 12/24/2017  . Influenza,inj,Quad PF,6+ Mos 12/18/2015, 01/13/2017  . Influenza-Unspecified 12/30/2012  . Pneumococcal Conjugate-13 02/08/2014  . Pneumococcal Polysaccharide-23 06/08/2004  . Zoster Recombinat (Shingrix) 12/24/2017     Qualifies for Shingles Vaccine? Yes, due for 2nd Shingrix after 02/23/18.  Screening Tests Health Maintenance  Topic Date Due  . HEMOGLOBIN A1C  14-Jul-1934  . FOOT EXAM  10/12/1944  . OPHTHALMOLOGY EXAM  10/12/1944  . DEXA SCAN  10/11/2001  . TETANUS/TDAP  01/22/2023  . INFLUENZA VACCINE  Completed  . PNA vac Low Risk Adult  Completed   Recommend hemoglobin A1c and foot exam at next visit with Particia Nearing PA  Patient had eye exam scheduled in October 2019, but had to cancel because she was not feeling well.  Advised her to reschedule as soon as possible.     Cancer Screenings: Lung: Low Dose CT Chest recommended if Age 13-80 years, 30 pack-year currently smoking OR have quit w/in 15years. Patient does not qualify. Breast:  Up to date on Mammogram? No, scheduled for 03/17/18 here at [facility].  Up to date of Bone Density/Dexa? No, done today Colorectal: Yes  Additional Screenings:  Hepatitis C Screening: Not indicated     Plan:      Work on your goal of having something to eat 3 times per day.  Yogurt, cheese toast, fruit, vegetables, lean proteins, and whole grains as tolerated are great options.   Bring a copy of your healthcare power of attorney, and living will to our office to be filed in your medical record.    Remember to get your 2nd Shingrix vaccine after 02/23/18.    I  have personally reviewed and noted the following in the patient's chart:   . Medical and social history . Use of alcohol, tobacco or illicit drugs  . Current medications and supplements . Functional ability and status . Nutritional status . Physical activity . Advanced directives . List of other physicians . Hospitalizations, surgeries, and ER visits in previous 12 months . Vitals . Screenings to include cognitive, depression, and falls . Referrals and appointments  In addition, I have reviewed and discussed with patient certain preventive protocols, quality metrics, and best practice recommendations. A written  personalized care plan for preventive services as well as general preventive health recommendations were provided to patient.     Kinsly Hild M, RN  02/02/2018  I have reviewed and agree with the above AWV documentation.   Terald Sleeper PA-C [facility] [address]  [place], [place] [address] [phone]

## 2018-02-05 ENCOUNTER — Other Ambulatory Visit: Payer: Medicare Other

## 2018-02-16 ENCOUNTER — Telehealth: Payer: Self-pay | Admitting: *Deleted

## 2018-02-16 MED ORDER — DENOSUMAB 60 MG/ML ~~LOC~~ SOSY: 60.0000 mg | PREFILLED_SYRINGE | SUBCUTANEOUS | 0 refills | Status: DC

## 2018-02-16 NOTE — Telephone Encounter (Signed)
Insurance verification submitted but an instant verification was not available. She has both Medicare and Medicaid and so she should have close 100% coverage through her pharmacy benefits. The verification should be available in a couple of days but I will go ahead and [facility] [place] with a note to let me know if it is not covered as expected. She should call to schedule an appointment for the injection once she has picked it up. Rx would not transmit electronically so I will have Glenard Haring sign the paper copy and I will fax it to [facility].   Spoke with patient and she is aware that we are sending in the prescription to the pharmacy. Appt scheduled for 02/24/18 for injection.

## 2018-02-18 MED ORDER — DENOSUMAB 60 MG/ML ~~LOC~~ SOSY: 60.0000 mg | PREFILLED_SYRINGE | SUBCUTANEOUS | 0 refills | Status: AC

## 2018-02-18 MED ORDER — DENOSUMAB 60 MG/ML ~~LOC~~ SOSY: 60.0000 mg | PREFILLED_SYRINGE | SUBCUTANEOUS | 0 refills | Status: DC

## 2018-02-24 ENCOUNTER — Ambulatory Visit (INDEPENDENT_AMBULATORY_CARE_PROVIDER_SITE_OTHER): Payer: Medicare Other | Admitting: *Deleted

## 2018-02-24 DIAGNOSIS — M810 Age-related osteoporosis without current pathological fracture: Secondary | ICD-10-CM

## 2018-02-24 MED ORDER — DENOSUMAB 60 MG/ML ~~LOC~~ SOSY
60.0000 mg | PREFILLED_SYRINGE | Freq: Once | SUBCUTANEOUS | Status: AC
  Administered 2018-02-24: 60 mg via SUBCUTANEOUS

## 2018-02-24 NOTE — Progress Notes (Signed)
Pt given Prolia inj Tolerated well Pt supplied  Dexa 01/2018 Calcium level 01/2018   9.6

## 2018-03-03 ENCOUNTER — Encounter: Payer: Self-pay | Admitting: *Deleted

## 2018-03-09 ENCOUNTER — Encounter: Payer: Self-pay | Admitting: Internal Medicine

## 2018-03-09 ENCOUNTER — Ambulatory Visit (INDEPENDENT_AMBULATORY_CARE_PROVIDER_SITE_OTHER): Payer: Medicare Other | Admitting: Internal Medicine

## 2018-03-09 VITALS — BP 112/66 | HR 65 | Ht 62.0 in | Wt 158.6 lb

## 2018-03-09 DIAGNOSIS — K449 Diaphragmatic hernia without obstruction or gangrene: Secondary | ICD-10-CM | POA: Diagnosis not present

## 2018-03-09 DIAGNOSIS — R6881 Early satiety: Secondary | ICD-10-CM

## 2018-03-09 DIAGNOSIS — R1010 Upper abdominal pain, unspecified: Secondary | ICD-10-CM | POA: Diagnosis not present

## 2018-03-09 DIAGNOSIS — Z85038 Personal history of other malignant neoplasm of large intestine: Secondary | ICD-10-CM

## 2018-03-09 DIAGNOSIS — R112 Nausea with vomiting, unspecified: Secondary | ICD-10-CM

## 2018-03-09 NOTE — Patient Instructions (Signed)
Follow up with Dr Hilarie Fredrickson in 2-3 months.  If you are age 81 or older, your body mass index should be between 23-30. Your Body mass index is 29.01 kg/m. If this is out of the aforementioned range listed, please consider follow up with your Primary Care Provider.  If you are age 34 or younger, your body mass index should be between 19-25. Your Body mass index is 29.01 kg/m. If this is out of the aformentioned range listed, please consider follow up with your Primary Care Provider.

## 2018-03-09 NOTE — Progress Notes (Signed)
Subjective:    Patient ID: [name], female    DOB: 03/23/1935, 83 y.o.   MRN: [id number]  HPI Shaylie Concannon is an 83 year old female with a complex past medical history including large tubular adenoma of the right colon partially resected at [facility] in June 2017, adenocarcinoma of the ascending colon arising from residual colon polyp status post right colectomy with parastomal hernia repair in February 2019, history of sigmoid diverticulitis complicated by intra-abdominal abscesses status post Hartman's procedure with end colostomy, history of large hiatal hernia status post repair x2 now with recurrent large hiatal hernia here for follow-up.  She is alone today.  She was last seen in our office by 1 of by advanced practitioners on 06/12/2016.  Most recently she has been struggling with recurrent hiatal hernia symptoms.  She has been seen by Dr. Johney Maine to discuss repeat hiatal hernia repair.  She has ongoing issues with early satiety, upper abdominal bloating and discomfort.  Nausea is a daily problem and on some days can be severe.  She uses Zofran to help "knocked the edge off".  Occasional vomiting episodes.  Feels like she would have no symptoms if she did not have to eat.  Bowel movements per ostomy have been better since using MiraLAX on a more regular basis.  Without MiraLAX she was feeling significant constipation.  She has had a prior normal esophageal manometry.  Dr. Johney Maine repeated an upper GI series and she has been getting surveillance CT of the chest abdomen pelvis with Dr. Burr Medico, most recently in Oct 2019. See objective.  She has remained on Nexium 40 mg a day which does seem to help with heartburn and indigestion type symptoms.  She takes oral iron for history of iron deficiency.  Review of Systems As per HPI, otherwise negative  Current Medications, Allergies, Past Medical History, Past Surgical History, Family History and Social History were reviewed in [employer]  record.     Objective:   Physical Exam BP 112/66   Pulse 65   Ht 5\' 2"  (1.575 m)   Wt 158 lb 9.6 oz (71.9 kg)   BMI 29.01 kg/m  Constitutional: Well-developed and well-nourished. No distress. HEENT: Normocephalic and atraumatic.  Conjunctivae are normal.  No scleral icterus. Neck: Neck supple. Trachea midline. Cardiovascular: Normal rate, regular rhythm and intact distal pulses. No M/R/G Pulmonary/chest: Effort normal and breath sounds normal. No wheezing, rales or rhonchi. Abdominal: Soft, nontender, nondistended. Bowel sounds active throughout.  Colostomy left lower quadrant.  Well-healed abdominal incisions/scars Extremities: no clubbing, cyanosis, or edema Neurological: Alert and oriented to person place and time. Skin: Skin is warm and dry. Psychiatric: Normal mood and affect. Behavior is normal.  UPPER GI SERIES WITH KUB   TECHNIQUE: After obtaining a scout radiograph a routine upper GI series was performed using thin and high density barium.   FLUOROSCOPY TIME:  Fluoroscopy Time:  1 minutes 42 seconds   Radiation Exposure Index (if provided by the fluoroscopic device): 241 mGy   Number of Acquired Spot Images: 13   COMPARISON:  CT abdomen pelvis 01/01/2018.   FINDINGS: Scout view of the abdomen shows a normal bowel gas pattern. No unexpected radiopaque calculi. Scattered surgical clips.   Double contrast examination of the upper gastrointestinal tract shows fairly normal esophageal motility. No esophageal fold thickening, stricture or obstruction. Large hiatal hernia with the majority of the stomach above the hemidiaphragms. Stomach is otherwise unremarkable. Duodenal bulb is normal.   IMPRESSION: Hiatal hernia.  Electronically Signed   By: Lorin Picket M.D.   On: 01/12/2018 08:39   CT ABDOMEN AND PELVIS WITHOUT CONTRAST   TECHNIQUE: Multidetector CT imaging of the abdomen and pelvis was performed following the standard protocol without IV  contrast.   COMPARISON:  CT 03/13/17   FINDINGS: Lower chest: Lung bases are clear. Large hiatal hernia extends into the RIGHT posterior mediastinum.   Hepatobiliary: No focal hepatic lesion on noncontrast exam.   Pancreas: Pancreas is normal. No ductal dilatation. No pancreatic inflammation.   Spleen: Normal spleen   Adrenals/urinary tract: Adrenal glands normal. Bilateral simple fluid attenuation cystic renal lesions. No hydronephrosis. No hydroureter. Bladder normal.   Stomach/Bowel: Hiatal hernia as above. Duodenum small-bowel normal. RIGHT hemicolectomy anatomy. Contrast flows into the transverse colon. The descending colon exits a LEFT lower quadrant ostomy. No small bowel enters hernia sac. No obstruction.   More distal rectosigmoid colon is excluded.   Vascular/Lymphatic: Abdominal aorta is normal caliber with atherosclerotic calcification. There is no retroperitoneal or periportal lymphadenopathy. No pelvic lymphadenopathy.   Reproductive: Uterus normal.   Other: No free fluid.   Musculoskeletal: No aggressive osseous lesion.   IMPRESSION: 1. LEFT lower quadrant colostomy without complication. 2. No evidence of colorectal carcinoma recurrence or metastasis. 3. Large hiatal hernia with the entire stomach above the diaphragm.     Electronically Signed   By: Suzy Bouchard M.D.   On: 01/01/2018 15:11   CBC    Component Value Date/Time   WBC 10.9 (H) 01/30/2018 0913   WBC 7.4 01/01/2018 1024   WBC 9.6 05/10/2017 0520   RBC 4.60 01/30/2018 0913   RBC 4.40 01/01/2018 1024   HGB 12.2 01/30/2018 0913   HCT 39.1 01/30/2018 0913   PLT 206 01/30/2018 0913   MCV 85 01/30/2018 0913   MCH 26.5 (L) 01/30/2018 0913   MCH 27.1 01/01/2018 1024   MCHC 31.2 (L) 01/30/2018 0913   MCHC 32.6 01/01/2018 1024   RDW 14.1 01/30/2018 0913   LYMPHSABS 1.5 01/30/2018 0913   MONOABS 0.5 01/01/2018 1024   EOSABS 0.2 01/30/2018 0913   BASOSABS 0.1 01/30/2018 0913    CMP     Component Value Date/Time   NA 141 01/30/2018 0913   K 4.2 01/30/2018 0913   CL 102 01/30/2018 0913   CO2 24 01/30/2018 0913   GLUCOSE 98 01/30/2018 0913   GLUCOSE 96 01/01/2018 1024   BUN 15 01/30/2018 0913   CREATININE 1.62 (H) 01/30/2018 0913   CREATININE 1.58 (H) 01/01/2018 1024   CALCIUM 9.6 01/30/2018 0913   PROT 6.4 01/30/2018 0913   ALBUMIN 4.0 01/30/2018 0913   AST 14 01/30/2018 0913   AST 10 (L) 01/01/2018 1024   ALT 8 01/30/2018 0913   ALT 9 01/01/2018 1024   ALKPHOS 73 01/30/2018 0913   BILITOT 0.3 01/30/2018 0913   BILITOT 0.3 01/01/2018 1024   GFRNONAA 30 (L) 01/30/2018 0913   GFRNONAA 30 (L) 01/01/2018 1024   GFRAA 34 (L) 01/30/2018 0913   GFRAA 34 (L) 01/01/2018 1024       Assessment & Plan:   84 year old female with a complex past medical history including large tubular adenoma of the right colon partially resected at [facility] in June 2017, adenocarcinoma of the ascending colon arising from residual colon polyp status post right colectomy with parastomal hernia repair in February 2019, history of sigmoid diverticulitis complicated by intra-abdominal abscesses status post Hartman's procedure with end colostomy, history of large hiatal hernia status post repair x2  now with recurrent large hiatal hernia here for follow-up.   1. Large/recurrent symptomatic hiatal hernia/nausea and intermittent vomiting/upper abd bloating with early satiety --her upper GI symptoms are felt related to her large hiatal hernia.  Most of her stomach is above her diaphragm.  We discussed how I do not have other medical options to improve her symptoms and the ultimate treatment would be surgical.  Certainly given her age and prior hiatal hernia repairs this could be more complicated and involved more risk.  She has been advised of this including the increased risks, and she very much wishes to proceed with surgery. --At this point I am not in plan to repeat upper endoscopy unless Dr.  Johney Maine feels that this is needed before surgery --She will continue Nexium 40 mg daily --She will contact Dr. Johney Maine for a follow-up appointment to discuss scheduling surgery  2.  History of colon cancer --she is due surveillance colonoscopy in February 2020.  If she is going to have her hiatal hernia surgery soon, we can have her come back when she has been released postoperatively from surgery to schedule this colonoscopy. --She also has follow-up in surveillance with Dr. Burr Medico

## 2018-03-17 ENCOUNTER — Other Ambulatory Visit: Payer: Self-pay | Admitting: Physician Assistant

## 2018-03-17 ENCOUNTER — Telehealth: Payer: Self-pay | Admitting: Physician Assistant

## 2018-03-17 MED ORDER — AMOXICILLIN 500 MG PO CAPS: 500.0000 mg | ORAL_CAPSULE | Freq: Three times a day (TID) | ORAL | 0 refills | Status: DC

## 2018-03-17 NOTE — Telephone Encounter (Signed)
Sent amoxicillin to the pharmacy.

## 2018-03-17 NOTE — Telephone Encounter (Signed)
Patient aware.

## 2018-03-17 NOTE — Telephone Encounter (Signed)
What symptoms do you have? Sinus headache.   How long have you been sick? Over a week  Have you been seen for this problem? In the past  If your provider decides to give you a prescription, which pharmacy would you like for it to be sent [facility]   Patient informed that this information will be sent to the clinical staff for review and that they should receive a follow up call.

## 2018-03-23 ENCOUNTER — Ambulatory Visit: Payer: Self-pay | Admitting: Surgery

## 2018-03-27 LAB — HM MAMMOGRAPHY

## 2018-04-15 ENCOUNTER — Ambulatory Visit (INDEPENDENT_AMBULATORY_CARE_PROVIDER_SITE_OTHER): Payer: Medicare Other | Admitting: Physician Assistant

## 2018-04-15 ENCOUNTER — Encounter: Payer: Self-pay | Admitting: Physician Assistant

## 2018-04-15 VITALS — BP 125/71 | HR 67 | Temp 98.1°F | Ht 62.0 in | Wt 157.0 lb

## 2018-04-15 DIAGNOSIS — J209 Acute bronchitis, unspecified: Secondary | ICD-10-CM | POA: Diagnosis not present

## 2018-04-15 MED ORDER — METHYLPREDNISOLONE ACETATE 80 MG/ML IJ SUSP
80.0000 mg | Freq: Once | INTRAMUSCULAR | Status: AC
  Administered 2018-04-15: 80 mg via INTRAMUSCULAR

## 2018-04-15 MED ORDER — ALBUTEROL SULFATE (2.5 MG/3ML) 0.083% IN NEBU
2.5000 mg | INHALATION_SOLUTION | Freq: Once | RESPIRATORY_TRACT | Status: AC
  Administered 2018-04-15: 2.5 mg via RESPIRATORY_TRACT

## 2018-04-15 MED ORDER — LEVOFLOXACIN 500 MG PO TABS: 500.0000 mg | ORAL_TABLET | Freq: Every day | ORAL | 0 refills | Status: DC

## 2018-04-15 MED ORDER — ALBUTEROL SULFATE HFA 108 (90 BASE) MCG/ACT IN AERS: 2.0000 | INHALATION_SPRAY | Freq: Four times a day (QID) | RESPIRATORY_TRACT | 2 refills | Status: AC | PRN

## 2018-04-15 NOTE — Progress Notes (Addendum)
BP 125/71   Pulse 67   Temp 98.1 F (36.7 C) (Oral)   Ht 5\' 2"  (1.575 m)   Wt 157 lb (71.2 kg)   BMI 28.72 kg/m    Subjective:    Patient ID: [name], female    DOB: [birth year], 84 y.o.   MRN: [id number]  HPI: Graviela I Richardson is a 84 y.o. female presenting on 04/15/2018 for Cough (started Saturday); Generalized Body Aches; Ear Pain; Sore Throat; and Chills  Patient with several days of progressing upper respiratory and bronchial symptoms. Initially there was more upper respiratory congestion. This progressed to having significant cough that is productive throughout the day and severe at night. There is occasional wheezing after coughing. Sometimes there is slight dyspnea on exertion. It is productive mucus that is yellow in color. Denies any blood.          Past Medical History:  Diagnosis Date  . Anemia due to blood loss, chronic   . Anxiety state, unspecified   . Atrophy of left kidney   . CKD (chronic kidney disease), stage III ([place])   . Coronary artery disease cardiologist-  dr Bronson Ing   a. Moderate to severe coronary disease involving the left anterior descending artery and right coronary artery.  Sequential stenosis in the LAD is significant.  Right coronary artery is moderate to severely likely nonischemic. Questionable small LVOT obstruction.  No Brockenbrough maneuver was performed.  Catheterization January 2013 w/ PTCA and DES x1 to pLAD b. 05/2014 MV no isch/infarct, EF 60%.  . Depressive disorder, not elsewhere classified   . Diastolic CHF, chronic [facility])    cardiologist-  dr Bronson Ing  . Diversion colitis   . GERD (gastroesophageal reflux disease)   . H/O hiatal hernia   . History of [name]'s esophagus   . History of melanoma excision    back  . Hypertension   . Hypothyroidism   . Macular degeneration    both eyes  . PAF (paroxysmal atrial fibrillation) ([place])   . Parastomal hernia   . PONV (postoperative nausea and vomiting)   . Pulmonary hypertension ([place])      PA systolic [id number] mmHg by echocardiogram 06-17-2015  PA pressure 05-08-2011 by cardiac catheterization -- no sig. pulm. htn)PA saturation 62% thermodilution cardiac index 2.0 thick cardiac index 2.4  . Pure hypercholesterolemia   . S/P drug eluting coronary stent placement 05/08/2011   x1 DES to proximal LAD  . Tubulovillous adenoma of colon   . Wears dentures    lower  . Wears glasses    Relevant past medical, surgical, family and social history reviewed and updated as indicated. Interim medical history since our last visit reviewed. Allergies and medications reviewed and updated. DATA REVIEWED: CHART IN EPIC  Family History reviewed for pertinent findings.  Review of Systems  Constitutional: Positive for chills, fatigue and fever. Negative for activity change and appetite change.  HENT: Positive for congestion, postnasal drip and sore throat.   Eyes: Negative.   Respiratory: Positive for cough, shortness of breath and wheezing.   Cardiovascular: Negative.  Negative for chest pain, palpitations and leg swelling.  Gastrointestinal: Negative.   Genitourinary: Negative.   Musculoskeletal: Negative.   Skin: Negative.   Neurological: Positive for headaches.    Allergies as of 04/15/2018      Reactions   Sulfamethoxazole Rash      Medication List       Accurate as of April 15, 2018  4:54 PM. Always use your most  recent med list.        albuterol 108 (90 Base) MCG/ACT inhaler Commonly known as:  PROVENTIL HFA;VENTOLIN HFA Inhale 2 puffs into the lungs every 6 (six) hours as needed for wheezing or shortness of breath.   ALPRAZolam 0.25 MG tablet Commonly known as:  XANAX TAKEK 1/2 TO 1 TABLET 3 TIMES DAILY AS NEEDED   amLODipine 5 MG tablet Commonly known as:  NORVASC TAKE ONE (1) TABLET EACH DAY   citalopram 40 MG tablet Commonly known as:  CELEXA TAKE ONE (1) TABLET EACH DAY   denosumab 60 MG/ML Sosy injection Commonly known as:  PROLIA Inject 60 mg into  the skin every 6 (six) months.   esomeprazole 40 MG capsule Commonly known as:  NEXIUM TAKE ONE (1) CAPSULE EACH DAY   ferrous sulfate 325 (65 FE) MG EC tablet Take 325 mg by mouth daily with breakfast.   fluticasone 50 MCG/ACT nasal spray Commonly known as:  FLONASE USE 2 SPRAYS IN EACH NOSTRIL DAILY   hydrALAZINE 50 MG tablet Commonly known as:  APRESOLINE TAKE ONE (1) TABLET THREE (3) TIMES EACH DAY   isosorbide mononitrate 30 MG 24 hr tablet Commonly known as:  IMDUR Take 1 tablet (30 mg total) by mouth every morning.   levofloxacin 500 MG tablet Commonly known as:  LEVAQUIN Take 1 tablet (500 mg total) by mouth daily.   levothyroxine 50 MCG tablet Commonly known as:  SYNTHROID, LEVOTHROID TAKE ONE TABLET EACH MORNING BEFORE BREAKFAST   loratadine 10 MG tablet Commonly known as:  CLARITIN Take 1 tablet (10 mg total) by mouth at bedtime.   losartan 100 MG tablet Commonly known as:  COZAAR TAKE ONE (1) TABLET EACH DAY   Meclizine HCl 25 MG Chew TAKE 1 TABLET 3 TIMES DAILY AS NEEDED FOR DIZZINESS   metoprolol succinate 25 MG 24 hr tablet Commonly known as:  TOPROL-XL Take 25 mg by mouth once daily at night   montelukast 10 MG tablet Commonly known as:  SINGULAIR Take 1 tablet (10 mg total) by mouth at bedtime.   nitroGLYCERIN 0.4 MG SL tablet Commonly known as:  NITROSTAT Place 1 tablet (0.4 mg total) under the tongue every 5 (five) minutes as needed for chest pain. Reported on 04/26/2015   ondansetron 8 MG tablet Commonly known as:  ZOFRAN Take 1 tablet (8 mg total) by mouth every 8 (eight) hours as needed for nausea or vomiting.   polyethylene glycol powder powder Commonly known as:  GAVILAX MIX 17 GRAMS IN 80Z OF WATER/JUICE AND DRINK TWICE DAILY   potassium chloride 10 MEQ tablet Commonly known as:  K-DUR TAKE ONE (1) TABLET EACH DAY   rosuvastatin 10 MG tablet Commonly known as:  CRESTOR TAKE ONE (1) TABLET EACH DAY   SYSTANE BALANCE OP Apply  1 drop to eye daily as needed (dry eyes).   traMADol 50 MG tablet Commonly known as:  ULTRAM Take 1 tablet (50 mg total) by mouth every 12 (twelve) hours as needed.   traZODone 100 MG tablet Commonly known as:  DESYREL Take 1 tablet (100 mg total) by mouth at bedtime.   Vitamin D (Ergocalciferol) 1.25 MG (50000 UT) Caps capsule Commonly known as:  DRISDOL TAKE 1 CAPSULE ON TUESDAYS   zolpidem 10 MG tablet Commonly known as:  AMBIEN Take 1 tablet (10 mg total) by mouth at bedtime.          Objective:    BP 125/71   Pulse 67   Temp  98.1 F (36.7 C) (Oral)   Ht 5\' 2"  (1.575 m)   Wt 157 lb (71.2 kg)   BMI 28.72 kg/m   Allergies  Allergen Reactions  . Sulfamethoxazole Rash    Wt Readings from Last 3 Encounters:  04/15/18 157 lb (71.2 kg)  03/09/18 158 lb 9.6 oz (71.9 kg)  02/02/18 154 lb (69.9 kg)    Physical Exam Constitutional:      Appearance: She is well-developed.  HENT:     Head: Normocephalic and atraumatic.     Right Ear: Drainage and tenderness present.     Left Ear: Drainage and tenderness present.     Nose: Mucosal edema and rhinorrhea present.     Right Sinus: No maxillary sinus tenderness or frontal sinus tenderness.     Left Sinus: No maxillary sinus tenderness or frontal sinus tenderness.     Mouth/Throat:     Pharynx: Oropharyngeal exudate and posterior oropharyngeal erythema present.  Eyes:     Conjunctiva/sclera: Conjunctivae normal.     Pupils: Pupils are equal, round, and reactive to light.  Neck:     Musculoskeletal: Normal range of motion and neck supple.  Cardiovascular:     Rate and Rhythm: Normal rate and regular rhythm.     Heart sounds: Normal heart sounds.  Pulmonary:     Effort: Pulmonary effort is normal.     Breath sounds: Examination of the right-upper field reveals wheezing. Examination of the left-upper field reveals wheezing. Wheezing present.  Abdominal:     General: Bowel sounds are normal.     Palpations: Abdomen is  soft.  Skin:    General: Skin is warm and dry.     Findings: No rash.  Neurological:     Mental Status: She is alert and oriented to person, place, and time.     Deep Tendon Reflexes: Reflexes are normal and symmetric.  Psychiatric:        Behavior: Behavior normal.        Thought Content: Thought content normal.        Judgment: Judgment normal.         Assessment & Plan:   1. Bronchitis, acute, with bronchospasm - levofloxacin (LEVAQUIN) 500 MG tablet; Take 1 tablet (500 mg total) by mouth daily.  Dispense: 7 tablet; Refill: 0 - methylPREDNISolone acetate (DEPO-MEDROL) injection 80 mg - albuterol (PROVENTIL HFA;VENTOLIN HFA) 108 (90 Base) MCG/ACT inhaler; Inhale 2 puffs into the lungs every 6 (six) hours as needed for wheezing or shortness of breath.  Dispense: 18 g; Refill: 2 - albuterol (PROVENTIL) (2.5 MG/3ML) 0.083% nebulizer solution 2.5 mg  OFFICE NEBULIZER with albuterol given. Improved breathing.  Continue all other maintenance medications as listed above.  Follow up plan: No follow-ups on file.  Educational handout given for [place] PA-C [place] [address]  [place], [place] [address] [phone]   04/15/2018, 4:54 PM

## 2018-05-01 NOTE — Progress Notes (Signed)
[place]   [phone] [phone]   Clinic Follow up Note   Patient Care Team: Terald Sleeper, PA-C as PCP - General (General Practice) Okey Regal, [place] (Optometry) Herminio Commons, MD as Attending Physician (Cardiology) Truitt Merle, MD as Consulting Physician (Hematology) Leighton Ruff, MD as Consulting Physician (Colon and Rectal Surgery) Michael Boston, MD as Consulting Physician (General Surgery) Pyrtle, Lajuan Lines, MD as Consulting Physician (Gastroenterology) Fran Lowes, MD as Consulting Physician (Nephrology)  Date of Service:  05/04/2018  CHIEF COMPLAINT: Follow up invasive colorectal adenocarcinoma  SUMMARY OF ONCOLOGIC HISTORY: Oncology History   Cancer Staging Cancer of right colon [facility]) Staging form: Colon and Rectum, AJCC 8th Edition - Pathologic stage from 05/07/2017: Stage IIIB (pT3, pN1a, cM0) - Signed by Truitt Merle, MD on 05/20/2017       Cancer of right colon ([place])   02/06/2017 Procedure    IMPRESSION: Colon, right, biopsy - Villous adenoma with multifocal high-grade dysplasia (multiple fragments), arising in a background of traditional serrated adenoma, see comment. See Comment Comment -There is a vigorous stromal reaction, possibly reaction to previous biopsy. While no definitive invasion is identified, the specimen appears small in comparison to the clinically described lesion and may not be representative.     03/13/2017 Imaging    CT AP W Contrast 03/13/17 IMPRESSION: 1. Stable moderate to large parastomal hernia containing multiple small bowel loops in the ventral left abdominal wall at the end colostomy site. No evidence of acute bowel complication. 2. Large hiatal hernia. 3. Finely irregular liver surface, suggesting cirrhosis. Consider hepatic elastography for further liver fibrosis risk stratification, as clinically warranted. 4. Stable 1.0 cm cystic pancreatic neck lesion. Follow-up MRI abdomen  without with IV contrast recommended in 2 years. This recommendation follows ACR consensus guidelines: Management of Incidental Pancreatic Cysts: A White Paper of the ACR Incidental Findings Committee. J Am Coll Radiol 6599;35:701-779. 5. Stable 4 mm left UPJ stone.  No hydronephrosis. 6.  Aortic Atherosclerosis (ICD10-I70.0).  Coronary atherosclerosis.    05/07/2017 Surgery    Colectomy and parastomal hernia repair by [name]     05/07/2017 Pathology Results    Diagnosis 05/07/17 Colon, segmental resection for tumor, right colon and terminal ileum - INVASIVE COLORECTAL ADENOCARCINOMA, 3.3 CM. - TUMOR EXTENDS INTO PERICOLONIC CONNECTIVE TISSUE. - MARGINS NOT INVOLVED. - METASTATIC CARCINOMA IN ONE OF TWENTY-SIX LYMPH NODES (1/26).    05/20/2017 Initial Diagnosis    Cancer of right colon ([place])    05/28/2017 Procedure    Echo Study Conclusions - Left ventricle: The cavity size was normal. Wall thickness was normal. Systolic function was normal. The estimated ejection  fraction was in the range of 55% to 60%. Wall motion was normal; there were no regional wall motion abnormalities. Features are consistent with a pseudonormal left ventricular filling pattern,  with concomitant abnormal relaxation and increased filling pressure (grade 2 diastolic dysfunction). Doppler parameters are consistent with high ventricular filling pressure. - Aortic valve: Valve area (VTI): 1.64 cm^2. Valve area (Vmax): 1.55 cm^2. Valve area (Vmean): 1.65 cm^2. - Mitral valve: Moderately calcified annulus. Normal thickness leaflets . There was mild regurgitation. - Left atrium: The atrium was severely dilated. - Atrial septum: No defect or patent foramen ovale was identified. - Pulmonary arteries: Systolic pressure was mildly increased. PA peak pressure: 37 mm Hg (S). - Technically adequate study.    06/05/2017 Imaging    CT CHEST W CONTRAST IMPRESSION: -No evidence of metastatic disease or other acute findings  within  the thorax. -Aortic Atherosclerosis (ICD10-I70.0) and Emphysema (ICD10-J43.9). -Coronary artery calcification. -Large hiatal hernia.     06/05/2017 Imaging    CT Chest WO Contrast 06/05/17 IMPRESSION: No evidence of metastatic disease or other acute findings within the thorax.    05/2017 - 05/2017 Chemotherapy    She began adjuvant Xeloda 1500 BID on 06/26/17. She discontinued herself after 3 doses, she could not tolerate due to nausea and abdominal discomfort. She has fully recovered     01/01/2018 Imaging    01/01/2018 CT AP  IMPRESSION: 1. LEFT lower quadrant colostomy without complication. 2. No evidence of colorectal carcinoma recurrence or metastasis. 3. Large hiatal hernia with the entire stomach above the diaphragm.      CURRENT THERAPY:  Surveillance   INTERVAL HISTORY:  [name] is here for a follow up of colon cancer. She presents to the clinic today by herself. She notes she is doing well. She notes she has her flu shot for this season. She denies abdominal pain now, but after eating she will have abdominal pain. She tries to eat smaller amounts at a time. She notes she takes miralax daily and denies blood in stool. She notes she will have hiatal hernia surgery on 06/05/18. She attributes pain to this. I reviewed medication list.  She notes she has been on Metoprolol which has decreased her heart rate, today at 47. This is managed by PCP which she plans to see tomorrow. She notes she has mild dizziness occasionally. This has been manageable for her. Patient notes her colostomy bag is likely permeant      REVIEW OF SYSTEMS:   Constitutional: Denies fevers, chills or abnormal weight loss Eyes: Denies blurriness of vision Ears, nose, mouth, throat, and face: Denies mucositis or sore throat Respiratory: Denies cough, dyspnea or wheezes Cardiovascular: Denies palpitation, chest discomfort or lower extremity swelling Gastrointestinal:  Denies nausea, heartburn or change in  bowel habits (+) occasional abdominal pain postprandial.  Skin: Denies abnormal skin rashes Lymphatics: Denies new lymphadenopathy or easy bruising Neurological:Denies numbness, tingling or new weaknesses Behavioral/Psych: Mood is stable, no new changes  All other systems were reviewed with the patient and are negative.  MEDICAL HISTORY:  Past Medical History:  Diagnosis Date  . Anemia due to blood loss, chronic   . Anxiety state, unspecified   . Atrophy of left kidney   . CKD (chronic kidney disease), stage III ([place])   . Coronary artery disease cardiologist-  dr Bronson Ing   a. Moderate to severe coronary disease involving the left anterior descending artery and right coronary artery.  Sequential stenosis in the LAD is significant.  Right coronary artery is moderate to severely likely nonischemic. Questionable small LVOT obstruction.  No Brockenbrough maneuver was performed.  Catheterization January 2013 w/ PTCA and DES x1 to pLAD b. 05/2014 MV no isch/infarct, EF 60%.  . Depressive disorder, not elsewhere classified   . Diastolic CHF, chronic [facility])    cardiologist-  dr Bronson Ing  . Diversion colitis   . GERD (gastroesophageal reflux disease)   . H/O hiatal hernia   . History of [name]'s esophagus   . History of melanoma excision    back  . Hypertension   . Hypothyroidism   . Macular degeneration    both eyes  . PAF (paroxysmal atrial fibrillation) ([place])   . Parastomal hernia   . PONV (postoperative nausea and vomiting)   . Pulmonary hypertension ([place])    PA systolic [id number] mmHg by echocardiogram 06-17-2015  PA pressure 05-08-2011 by cardiac catheterization -- no sig. pulm. htn)PA saturation 62% thermodilution cardiac index 2.0 thick cardiac index 2.4  . Pure hypercholesterolemia   . S/P drug eluting coronary stent placement 05/08/2011   x1 DES to proximal LAD  . Tubulovillous adenoma of colon   . Wears dentures    lower  . Wears glasses     SURGICAL HISTORY: Past  Surgical History:  Procedure Laterality Date  . CARDIAC CATHETERIZATION Left 03/05/2015   Procedure: CENTRAL LINE INSERTION;  Surgeon: Aviva Signs, MD;  Location: [facility];  Service: General;  Laterality: Left;  . CATARACT EXTRACTION W/ INTRAOCULAR LENS  IMPLANT, BILATERAL  ~ 2002  . CHOLECYSTECTOMY N/A 07/18/2016   Procedure: LAPAROSCOPIC CHOLECYSTECTOMY WITH INTRAOPERATIVE CHOLANGIOGRAM;  Surgeon: [name], MD;  Location: [facility];  Service: General;  Laterality: N/A;  . COLECTOMY WITH COLOSTOMY CREATION/HARTMANN PROCEDURE N/A 03/05/2015   Procedure: PARTIAL COLECTOMY WITH COLOSTOMY CREATION/HARTMANN PROCEDURE;  Surgeon: Aviva Signs, MD;  Location: [facility];  Service: General;  Laterality: N/A;  . ESOPHAGEAL MANOMETRY N/A 06/06/2014   Procedure: ESOPHAGEAL MANOMETRY (EM);  Surgeon: Jerene Bears, MD;  Location: [facility];  Service: Gastroenterology;  Laterality: N/A;  . ESOPHAGOGASTRODUODENOSCOPY (EGD) WITH PROPOFOL N/A 08/04/2015   Procedure: ESOPHAGOGASTRODUODENOSCOPY (EGD) WITH PROPOFOL;  Surgeon: Mauri Pole, MD;  Location: [facility];  Service: Endoscopy;  Laterality: N/A;  . HERNIA REPAIR    . IR GENERIC HISTORICAL  03/04/2016   IR LUMBAR DISC ASPIRATION W/IMG GUIDE 03/04/2016 Luanne Bras, MD [facility]-INTERV RAD  . LAPAROSCOPIC NISSEN FUNDOPLICATION N/A 7/86/7544   Procedure: LAPAROSCOPIC  LYSIS OF ADHESIONS, SEGMENTAL GASTRECTOMY, PARTIAL REDUCTION OF HERNIA;  Surgeon: [name], MD;  Location: [facility];  Service: General;  Laterality: N/A;  . LAPAROSCOPIC PARTIAL COLECTOMY N/A 05/07/2017   Procedure: LAPAROSCOPIC  CONVERTED TO OPEN RIGHT COLECTOMY, PARASTOMAL HERNIA  REPAIR;  Surgeon: Leighton Ruff, MD;  Location: [facility];  Service: General;  Laterality: N/A;  . NISSEN FUNDOPLICATION  9201  . PERCUTANEOUS CORONARY STENT INTERVENTION (PCI-S) N/A 05/08/2011   Procedure: PERCUTANEOUS CORONARY STENT INTERVENTION (PCI-S);  Surgeon: Wellington Hampshire, MD;  Location: [facility] CATH LAB;   Service: Cardiovascular;  Laterality: N/A;  . ROTATOR CUFF REPAIR  ~ 2001   right  . SHOULDER ARTHROSCOPY  ~ 2004; 2005   left; "joint's wore out"  . TONSILLECTOMY  ~ 1944  . TUBAL LIGATION  1972    I have reviewed the social history and family history with the patient and they are unchanged from previous note.  ALLERGIES:  is allergic to sulfamethoxazole.  MEDICATIONS:  Current Outpatient Medications  Medication Sig Dispense Refill  . albuterol (PROVENTIL HFA;VENTOLIN HFA) 108 (90 Base) MCG/ACT inhaler Inhale 2 puffs into the lungs every 6 (six) hours as needed for wheezing or shortness of breath. 18 g 2  . ALPRAZolam (XANAX) 0.25 MG tablet TAKEK 1/2 TO 1 TABLET 3 TIMES DAILY AS NEEDED 270 tablet 1  . amLODipine (NORVASC) 5 MG tablet TAKE ONE (1) TABLET EACH DAY 90 tablet 3  . citalopram (CELEXA) 40 MG tablet TAKE ONE (1) TABLET EACH DAY 90 tablet 3  . denosumab (PROLIA) 60 MG/ML SOSY injection Inject 60 mg into the skin every 6 (six) months. 1 mL 0  . esomeprazole (NEXIUM) 40 MG capsule TAKE ONE (1) CAPSULE EACH DAY 90 capsule 3  . ferrous sulfate 325 (65 FE) MG EC tablet Take 325 mg by mouth daily with breakfast.     . fluticasone (FLONASE) 50 MCG/ACT  nasal spray USE 2 SPRAYS IN EACH NOSTRIL DAILY 48 g 9  . hydrALAZINE (APRESOLINE) 50 MG tablet TAKE ONE (1) TABLET THREE (3) TIMES EACH DAY 270 tablet 3  . isosorbide mononitrate (IMDUR) 30 MG 24 hr tablet Take 1 tablet (30 mg total) by mouth every morning. 90 tablet 3  . levothyroxine (SYNTHROID, LEVOTHROID) 50 MCG tablet TAKE ONE TABLET EACH MORNING BEFORE BREAKFAST 90 tablet 3  . loratadine (CLARITIN) 10 MG tablet Take 1 tablet (10 mg total) by mouth at bedtime. 90 tablet 3  . losartan (COZAAR) 100 MG tablet TAKE ONE (1) TABLET EACH DAY 90 tablet 3  . Meclizine HCl 25 MG CHEW TAKE 1 TABLET 3 TIMES DAILY AS NEEDED FOR DIZZINESS (Patient taking differently: as needed. ) 30 each 0  . metoprolol succinate (TOPROL-XL) 25 MG 24 hr tablet  Take 25 mg by mouth once daily at night 180 tablet 3  . montelukast (SINGULAIR) 10 MG tablet Take 1 tablet (10 mg total) by mouth at bedtime. 90 tablet 3  . nitroGLYCERIN (NITROSTAT) 0.4 MG SL tablet Place 1 tablet (0.4 mg total) under the tongue every 5 (five) minutes as needed for chest pain. Reported on 04/26/2015 25 tablet 3  . ondansetron (ZOFRAN) 8 MG tablet Take 1 tablet (8 mg total) by mouth every 8 (eight) hours as needed for nausea or vomiting. 20 tablet 0  . polyethylene glycol powder (GAVILAX) powder MIX 17 GRAMS IN 80Z OF WATER/JUICE AND DRINK TWICE DAILY 1020 g 3  . potassium chloride (K-DUR) 10 MEQ tablet TAKE ONE (1) TABLET EACH DAY (Patient taking differently: Take 10 mEq by mouth once as needed (when taking diuretic). ) 90 tablet 0  . Propylene Glycol (SYSTANE BALANCE OP) Apply 1 drop to eye daily as needed (dry eyes).    . rosuvastatin (CRESTOR) 10 MG tablet TAKE ONE (1) TABLET EACH DAY 90 tablet 3  . traMADol (ULTRAM) 50 MG tablet Take 1 tablet (50 mg total) by mouth every 12 (twelve) hours as needed. 20 tablet 1  . traZODone (DESYREL) 100 MG tablet Take 1 tablet (100 mg total) by mouth at bedtime. 90 tablet 3  . Vitamin D, Ergocalciferol, (DRISDOL) 50000 units CAPS capsule TAKE 1 CAPSULE ON TUESDAYS 12 capsule 3  . zolpidem (AMBIEN) 10 MG tablet Take 1 tablet (10 mg total) by mouth at bedtime. 90 tablet 1   No current facility-administered medications for this visit.     PHYSICAL EXAMINATION: ECOG PERFORMANCE STATUS: 1 - Symptomatic but completely ambulatory  Vitals:   05/04/18 0809 05/04/18 0812  BP: (!) 167/66 (!) 159/60  Pulse: (!) 47   Resp: 17   Temp: 97.8 F (36.6 C)   SpO2: 98%    Filed Weights   05/04/18 0809  Weight: 158 lb 8 oz (71.9 kg)    GENERAL:alert, no distress and comfortable SKIN: skin color, texture, turgor are normal, no rashes or significant lesions EYES: normal, Conjunctiva are pink and non-injected, sclera clear OROPHARYNX:no exudate, no  erythema and lips, buccal mucosa, and tongue normal  NECK: supple, thyroid normal size, non-tender, without nodularity LYMPH:  no palpable lymphadenopathy in the cervical, axillary or inguinal LUNGS: clear to auscultation and percussion with normal breathing effort HEART: regular rate & rhythm and no murmurs and no lower extremity edema ABDOMEN:abdomen soft, non-tender and normal bowel sounds (+) Colostomy bag in place, clean Musculoskeletal:no cyanosis of digits and no clubbing  NEURO: alert & oriented x 3 with fluent speech, no focal motor/sensory deficits  LABORATORY DATA:  I have reviewed the data as listed CBC Latest Ref Rng & Units 05/04/2018 01/30/2018 01/01/2018  WBC 4.0 - 10.5 K/uL 4.9 10.9(H) 7.4  Hemoglobin 12.0 - 15.0 g/dL 12.3 12.2 11.9  Hematocrit 36.0 - 46.0 % 38.6 39.1 36.6  Platelets 150 - 400 K/uL 173 206 201     CMP Latest Ref Rng & Units 05/04/2018 01/30/2018 01/01/2018  Glucose 70 - 99 mg/dL 91 98 96  BUN 8 - 23 mg/dL _0 Creatinine 0.44 - 1.00 mg/dL 1.41(H) 1.62(H) 1.58(H)  Sodium 135 - 145 mmol/L 140 141 141  Potassium 3.5 - 5.1 mmol/L 4.1 4.2 4.1  Chloride 98 - 111 mmol/L 110 102 105  CO2 22 - 32 mmol/L _1 Calcium 8.9 - 10.3 mg/dL 8.7(L) 9.6 9.1  Total Protein 6.5 - 8.1 g/dL 6.4(L) 6.4 6.5  Total Bilirubin 0.3 - 1.2 mg/dL 0.3 0.3 0.3  Alkaline Phos 38 - 126 U/L 53 73 67  AST 15 - 41 U/L 11(L) 14 10(L)  ALT 0 - 44 U/L _2 RADIOGRAPHIC STUDIES: I have personally reviewed the radiological images as listed and agreed with the findings in the report. No results found.   ASSESSMENT & PLAN:  [name] is a 84 y.o. female with   1. Right colon adenocarcinoma Stage IIIB (pT3, pN1A, cM0), MSI-stable  -She was diagnosed in 05/2017. She is s/p complete surgical resection with [name] on 05/07/17 and pathology results reveal a T3 lesion and 1/26 lymph nodes positive for metastatic carcinoma.  Margins were negative. -We previously discussed the  risk of cancer recurrence after surgery due to her stage IIIB disease she is at moderate to high risk of recurrence and the role of adjuvant chemo  -She began adjuvant Xeloda 1500 BID on 06/26/17. She discontinued herself after 3 doses, she could not tolerate due to nausea and abdominal discomfort. She has fully recovered  -She is clinically doing very well, asymptomatic, exam was unremarkable. Lab reviewed, CBC WNL, CMP and iron panel still pending. no clinical concern for recurrence.  -She is due for surveillance colonoscopy this month. I will send a message to GI Dr. Hilarie Fredrickson  -Her last CT AP was on 01/01/2018, and it revealed no disease recurrence. I reviewed the image myself and discussed the result with the patient. -F/u in 4 months, continue colon cancer surveillance.  2. HTN, CAD, diastolic CHF, AF, CKD stage III -Continue follow-up with her primary care physician and her cardiologist -her Echo from 05/28/17 had a LV EF of 55-60% -Continue to follow-up with her primary care physician and the cardiologist. -Her HR is 47 today (05/04/18). She is on Metoprolol. I strongly encouraged her to discuss this with her PCP at her next visit tomorrow.   3. Iron deficient anemia -continue oral iron -HG normal. Iron panel is still pending today (05/04/18)   4.  Hiatal hernia -She is scheduled for surgery on June 05, 2018  PLAN: - I will send message to Dr. Hilarie Fredrickson to see if he can do colonoscopy this month before her hernia surgery in March  -Lab and f/u in 4 months      No problem-specific Assessment & Plan notes found for this encounter.   No orders of the defined types were placed in this encounter.  All questions were answered. The patient knows to call the clinic with any problems, questions or concerns. No barriers to learning was detected.  I spent 15 minutes counseling the patient face to face. The total time spent in the appointment was 20 minutes and more than 50% was on counseling and  review of test results     Truitt Merle, MD 05/04/2018   I, Joslyn Devon, am acting as scribe for Truitt Merle, MD.   I have reviewed the above documentation for accuracy and completeness, and I agree with the above.

## 2018-05-04 ENCOUNTER — Inpatient Hospital Stay: Payer: Medicare Other | Attending: Hematology

## 2018-05-04 ENCOUNTER — Inpatient Hospital Stay (HOSPITAL_BASED_OUTPATIENT_CLINIC_OR_DEPARTMENT_OTHER): Payer: Medicare Other | Admitting: Hematology

## 2018-05-04 ENCOUNTER — Telehealth: Payer: Self-pay | Admitting: Hematology

## 2018-05-04 ENCOUNTER — Ambulatory Visit: Payer: Medicare Other | Admitting: Physician Assistant

## 2018-05-04 ENCOUNTER — Encounter: Payer: Self-pay | Admitting: Hematology

## 2018-05-04 VITALS — BP 159/60 | HR 47 | Temp 97.8°F | Resp 17 | Ht 62.0 in | Wt 158.5 lb

## 2018-05-04 DIAGNOSIS — C182 Malignant neoplasm of ascending colon: Secondary | ICD-10-CM | POA: Insufficient documentation

## 2018-05-04 DIAGNOSIS — D509 Iron deficiency anemia, unspecified: Secondary | ICD-10-CM | POA: Insufficient documentation

## 2018-05-04 DIAGNOSIS — E7800 Pure hypercholesterolemia, unspecified: Secondary | ICD-10-CM | POA: Insufficient documentation

## 2018-05-04 DIAGNOSIS — K219 Gastro-esophageal reflux disease without esophagitis: Secondary | ICD-10-CM | POA: Insufficient documentation

## 2018-05-04 DIAGNOSIS — C779 Secondary and unspecified malignant neoplasm of lymph node, unspecified: Secondary | ICD-10-CM

## 2018-05-04 DIAGNOSIS — I480 Paroxysmal atrial fibrillation: Secondary | ICD-10-CM | POA: Diagnosis not present

## 2018-05-04 DIAGNOSIS — K449 Diaphragmatic hernia without obstruction or gangrene: Secondary | ICD-10-CM | POA: Diagnosis not present

## 2018-05-04 DIAGNOSIS — I130 Hypertensive heart and chronic kidney disease with heart failure and stage 1 through stage 4 chronic kidney disease, or unspecified chronic kidney disease: Secondary | ICD-10-CM | POA: Diagnosis not present

## 2018-05-04 DIAGNOSIS — N183 Chronic kidney disease, stage 3 (moderate): Secondary | ICD-10-CM | POA: Diagnosis not present

## 2018-05-04 DIAGNOSIS — I2510 Atherosclerotic heart disease of native coronary artery without angina pectoris: Secondary | ICD-10-CM | POA: Insufficient documentation

## 2018-05-04 DIAGNOSIS — Z79899 Other long term (current) drug therapy: Secondary | ICD-10-CM | POA: Insufficient documentation

## 2018-05-04 DIAGNOSIS — D500 Iron deficiency anemia secondary to blood loss (chronic): Secondary | ICD-10-CM

## 2018-05-04 LAB — CBC WITH DIFFERENTIAL (CANCER CENTER ONLY)
Abs Immature Granulocytes: 0.02 10*3/uL (ref 0.00–0.07)
Basophils Absolute: 0 10*3/uL (ref 0.0–0.1)
Basophils Relative: 0 %
EOS ABS: 0.2 10*3/uL (ref 0.0–0.5)
Eosinophils Relative: 3 %
HCT: 38.6 % (ref 36.0–46.0)
Hemoglobin: 12.3 g/dL (ref 12.0–15.0)
Immature Granulocytes: 0 %
Lymphocytes Relative: 29 %
Lymphs Abs: 1.4 10*3/uL (ref 0.7–4.0)
MCH: 26.7 pg (ref 26.0–34.0)
MCHC: 31.9 g/dL (ref 30.0–36.0)
MCV: 83.7 fL (ref 80.0–100.0)
Monocytes Absolute: 0.3 10*3/uL (ref 0.1–1.0)
Monocytes Relative: 7 %
NRBC: 0 % (ref 0.0–0.2)
Neutro Abs: 3 10*3/uL (ref 1.7–7.7)
Neutrophils Relative %: 61 %
Platelet Count: 173 10*3/uL (ref 150–400)
RBC: 4.61 MIL/uL (ref 3.87–5.11)
RDW: 14.8 % (ref 11.5–15.5)
WBC Count: 4.9 10*3/uL (ref 4.0–10.5)

## 2018-05-04 LAB — FERRITIN: FERRITIN: 46 ng/mL (ref 11–307)

## 2018-05-04 LAB — CMP (CANCER CENTER ONLY)
ALT: 9 U/L (ref 0–44)
AST: 11 U/L — ABNORMAL LOW (ref 15–41)
Albumin: 3.6 g/dL (ref 3.5–5.0)
Alkaline Phosphatase: 53 U/L (ref 38–126)
Anion gap: 7 (ref 5–15)
BUN: 18 mg/dL (ref 8–23)
CO2: 23 mmol/L (ref 22–32)
CREATININE: 1.41 mg/dL — AB (ref 0.44–1.00)
Calcium: 8.7 mg/dL — ABNORMAL LOW (ref 8.9–10.3)
Chloride: 110 mmol/L (ref 98–111)
GFR, EST AFRICAN AMERICAN: 40 mL/min — AB (ref >60)
GFR, Estimated: 35 mL/min — ABNORMAL LOW (ref >60)
Glucose, Bld: 91 mg/dL (ref 70–99)
Potassium: 4.1 mmol/L (ref 3.5–5.1)
Sodium: 140 mmol/L (ref 135–145)
Total Bilirubin: 0.3 mg/dL (ref 0.3–1.2)
Total Protein: 6.4 g/dL — ABNORMAL LOW (ref 6.5–8.1)

## 2018-05-04 LAB — IRON AND TIBC
Iron: 76 ug/dL (ref 41–142)
Saturation Ratios: 27 % (ref 21–57)
TIBC: 279 ug/dL (ref 236–444)
UIBC: 203 ug/dL (ref 120–384)

## 2018-05-04 LAB — CEA (IN HOUSE-CHCC): CEA (CHCC-In House): 2.66 ng/mL (ref 0.00–5.00)

## 2018-05-04 NOTE — Telephone Encounter (Signed)
Gave patient avs report and appointments for June.  °

## 2018-05-05 ENCOUNTER — Ambulatory Visit (INDEPENDENT_AMBULATORY_CARE_PROVIDER_SITE_OTHER): Payer: Medicare Other | Admitting: Physician Assistant

## 2018-05-05 ENCOUNTER — Encounter: Payer: Self-pay | Admitting: Physician Assistant

## 2018-05-05 ENCOUNTER — Telehealth: Payer: Self-pay | Admitting: *Deleted

## 2018-05-05 VITALS — BP 160/68 | HR 58 | Temp 97.6°F | Ht 62.0 in | Wt 158.4 lb

## 2018-05-05 DIAGNOSIS — I10 Essential (primary) hypertension: Secondary | ICD-10-CM

## 2018-05-05 DIAGNOSIS — E039 Hypothyroidism, unspecified: Secondary | ICD-10-CM

## 2018-05-05 DIAGNOSIS — C182 Malignant neoplasm of ascending colon: Secondary | ICD-10-CM

## 2018-05-05 MED ORDER — NYSTATIN 100000 UNIT/GM EX POWD: Freq: Four times a day (QID) | CUTANEOUS | 5 refills | Status: AC

## 2018-05-05 MED ORDER — AZITHROMYCIN 250 MG PO TABS: ORAL_TABLET | ORAL | 0 refills | Status: DC

## 2018-05-05 MED ORDER — METOPROLOL SUCCINATE ER 25 MG PO TB24: 12.5000 mg | ORAL_TABLET | Freq: Every day | ORAL | 3 refills | Status: DC

## 2018-05-05 NOTE — Telephone Encounter (Signed)
I have spoken to patient and have scheduled her for colonoscopy on 05/26/2018 at 2 pm. She has been scheduled for previsit on 05/13/2018 at 230pm. Patient verbalizes understanding of this.

## 2018-05-05 NOTE — Telephone Encounter (Signed)
-----   Message from Jerene Bears, MD sent at 05/05/2018  8:55 AM EST ----- Please schedule colonoscopy JMP ----- Message ----- From: Truitt Merle, MD Sent: 05/04/2018   8:36 AM EST To: Michael Boston, MD, Jerene Bears, MD  Ulice Dash,  She is due for colonoscopy this month. Her hiatal hernia surgery is scheduled for March 6.  She is open to have colonoscopy done this month if that works for you.  Thanks  Krista Blue

## 2018-05-06 NOTE — Progress Notes (Signed)
BP (!) 160/68   Pulse (!) 58   Temp 97.6 F (36.4 C) (Oral)   Ht 5\' 2"  (1.575 m)   Wt 158 lb 6.4 oz (71.8 kg)   SpO2 98%   BMI 28.97 kg/m    Subjective:    Patient ID: [name], female    DOB: 1934-04-15, 84 y.o.   MRN: [id number]  HPI: [name] is a 84 y.o. female presenting on 05/05/2018 for Hypothyroidism (3 month follow up ); Hypertension; and Cough  This patient comes in for follow-up of her medical conditions.  She is doing fairly well overall at this time.  She still sees all of her specialists including hematology and gastroenterology.  Her colostomy is doing fairly well she does have a little bit of rash underneath that and it will it this is down on her lower abdomen.  She does have hypothyroidism and hypertension.  She does need some refills on medications.  She states overall she is feeling fairly good except is getting started with a little bit of cough she does tend to develop a good bronchitis every winter.  Past Medical History:  Diagnosis Date  . Anemia due to blood loss, chronic   . Anxiety state, unspecified   . Atrophy of left kidney   . CKD (chronic kidney disease), stage III ([place])   . Coronary artery disease cardiologist-  dr Bronson Ing   a. Moderate to severe coronary disease involving the left anterior descending artery and right coronary artery.  Sequential stenosis in the LAD is significant.  Right coronary artery is moderate to severely likely nonischemic. Questionable small LVOT obstruction.  No Brockenbrough maneuver was performed.  Catheterization January 2013 w/ PTCA and DES x1 to pLAD b. 05/2014 MV no isch/infarct, EF 60%.  . Depressive disorder, not elsewhere classified   . Diastolic CHF, chronic [facility])    cardiologist-  dr Bronson Ing  . Diversion colitis   . GERD (gastroesophageal reflux disease)   . H/O hiatal hernia   . History of [name]'s esophagus   . History of melanoma excision    back  . Hypertension   . Hypothyroidism   . Macular  degeneration    both eyes  . PAF (paroxysmal atrial fibrillation) ([place])   . Parastomal hernia   . PONV (postoperative nausea and vomiting)   . Pulmonary hypertension ([place])    PA systolic [id number] mmHg by echocardiogram 06-17-2015  PA pressure 05-08-2011 by cardiac catheterization -- no sig. pulm. htn)PA saturation 62% thermodilution cardiac index 2.0 thick cardiac index 2.4  . Pure hypercholesterolemia   . S/P drug eluting coronary stent placement 05/08/2011   x1 DES to proximal LAD  . Tubulovillous adenoma of colon   . Wears dentures    lower  . Wears glasses    Relevant past medical, surgical, family and social history reviewed and updated as indicated. Interim medical history since our last visit reviewed. Allergies and medications reviewed and updated. DATA REVIEWED: CHART IN EPIC  Family History reviewed for pertinent findings.  Review of Systems  Constitutional: Negative.   HENT: Negative.   Eyes: Negative.   Respiratory: Negative.   Gastrointestinal: Negative.   Genitourinary: Negative.   Skin: Positive for color change and rash.    Allergies as of 05/05/2018      Reactions   Sulfamethoxazole Rash      Medication List       Accurate as of May 05, 2018 11:59 PM. Always use your most  recent med list.        albuterol 108 (90 Base) MCG/ACT inhaler Commonly known as:  PROVENTIL HFA;VENTOLIN HFA Inhale 2 puffs into the lungs every 6 (six) hours as needed for wheezing or shortness of breath.   ALPRAZolam 0.25 MG tablet Commonly known as:  XANAX TAKEK 1/2 TO 1 TABLET 3 TIMES DAILY AS NEEDED   amLODipine 5 MG tablet Commonly known as:  NORVASC TAKE ONE (1) TABLET EACH DAY   azithromycin 250 MG tablet Commonly known as:  ZITHROMAX Z-PAK Take as directed   citalopram 40 MG tablet Commonly known as:  CELEXA TAKE ONE (1) TABLET EACH DAY   denosumab 60 MG/ML Sosy injection Commonly known as:  PROLIA Inject 60 mg into the skin every 6 (six) months.     esomeprazole 40 MG capsule Commonly known as:  NEXIUM TAKE ONE (1) CAPSULE EACH DAY   ferrous sulfate 325 (65 FE) MG EC tablet Take 325 mg by mouth daily with breakfast.   fluticasone 50 MCG/ACT nasal spray Commonly known as:  FLONASE USE 2 SPRAYS IN EACH NOSTRIL DAILY   hydrALAZINE 50 MG tablet Commonly known as:  APRESOLINE TAKE ONE (1) TABLET THREE (3) TIMES EACH DAY   isosorbide mononitrate 30 MG 24 hr tablet Commonly known as:  IMDUR Take 1 tablet (30 mg total) by mouth every morning.   levothyroxine 50 MCG tablet Commonly known as:  SYNTHROID, LEVOTHROID TAKE ONE TABLET EACH MORNING BEFORE BREAKFAST   loratadine 10 MG tablet Commonly known as:  CLARITIN Take 1 tablet (10 mg total) by mouth at bedtime.   losartan 100 MG tablet Commonly known as:  COZAAR TAKE ONE (1) TABLET EACH DAY   Meclizine HCl 25 MG Chew TAKE 1 TABLET 3 TIMES DAILY AS NEEDED FOR DIZZINESS   metoprolol succinate 25 MG 24 hr tablet Commonly known as:  TOPROL-XL Take 0.5 tablets (12.5 mg total) by mouth daily.   montelukast 10 MG tablet Commonly known as:  SINGULAIR Take 1 tablet (10 mg total) by mouth at bedtime.   nitroGLYCERIN 0.4 MG SL tablet Commonly known as:  NITROSTAT Place 1 tablet (0.4 mg total) under the tongue every 5 (five) minutes as needed for chest pain. Reported on 04/26/2015   nystatin powder Commonly known as:  MYCOSTATIN/NYSTOP Apply topically 4 (four) times daily.   ondansetron 8 MG tablet Commonly known as:  ZOFRAN Take 1 tablet (8 mg total) by mouth every 8 (eight) hours as needed for nausea or vomiting.   polyethylene glycol powder powder Commonly known as:  GAVILAX MIX 17 GRAMS IN 80Z OF WATER/JUICE AND DRINK TWICE DAILY   potassium chloride 10 MEQ tablet Commonly known as:  K-DUR TAKE ONE (1) TABLET EACH DAY   rosuvastatin 10 MG tablet Commonly known as:  CRESTOR TAKE ONE (1) TABLET EACH DAY   SYSTANE BALANCE OP Apply 1 drop to eye daily as needed  (dry eyes).   traMADol 50 MG tablet Commonly known as:  ULTRAM Take 1 tablet (50 mg total) by mouth every 12 (twelve) hours as needed.   traZODone 100 MG tablet Commonly known as:  DESYREL Take 1 tablet (100 mg total) by mouth at bedtime.   Vitamin D (Ergocalciferol) 1.25 MG (50000 UT) Caps capsule Commonly known as:  DRISDOL TAKE 1 CAPSULE ON TUESDAYS   zolpidem 10 MG tablet Commonly known as:  AMBIEN Take 1 tablet (10 mg total) by mouth at bedtime.          Objective:  BP (!) 160/68   Pulse (!) 58   Temp 97.6 F (36.4 C) (Oral)   Ht 5\' 2"  (1.575 m)   Wt 158 lb 6.4 oz (71.8 kg)   SpO2 98%   BMI 28.97 kg/m   Allergies  Allergen Reactions  . Sulfamethoxazole Rash    Wt Readings from Last 3 Encounters:  05/05/18 158 lb 6.4 oz (71.8 kg)  05/04/18 158 lb 8 oz (71.9 kg)  04/15/18 157 lb (71.2 kg)    Physical Exam Constitutional:      Appearance: She is well-developed.  HENT:     Head: Normocephalic and atraumatic.  Eyes:     Conjunctiva/sclera: Conjunctivae normal.     Pupils: Pupils are equal, round, and reactive to light.  Cardiovascular:     Rate and Rhythm: Normal rate and regular rhythm.     Heart sounds: Normal heart sounds.  Pulmonary:     Effort: Pulmonary effort is normal.     Breath sounds: Normal breath sounds.  Abdominal:     General: Bowel sounds are normal.     Palpations: Abdomen is soft.       Comments: Colostomy and bag present. Red flat rash on lower abdmomen  Skin:    General: Skin is warm and dry.     Findings: No rash.  Neurological:     Mental Status: She is alert and oriented to person, place, and time.     Deep Tendon Reflexes: Reflexes are normal and symmetric.  Psychiatric:        Behavior: Behavior normal.        Thought Content: Thought content normal.        Judgment: Judgment normal.         Assessment & Plan:   1. Essential hypertension Continue medications  2. Cancer of right colon [facility]) Continue  oncology  3. Acquired hypothyroidism Recheck medications and labs   Continue all other maintenance medications as listed above.  Follow up plan: Return in about 4 months (around 09/03/2018) for recheck.  Educational handout given for [place] PA-C [facility] [address]  [place], [place] [id number] [phone]   05/06/2018, 1:59 PM

## 2018-05-07 ENCOUNTER — Telehealth: Payer: Self-pay

## 2018-05-07 NOTE — Telephone Encounter (Signed)
Spoke with patient regarding lab results, per Dr. Burr Medico iron study and CEA were all normal, no concerns.  Patient verbalized an understanding.

## 2018-05-07 NOTE — Telephone Encounter (Signed)
-----   Message from Truitt Merle, MD sent at 05/05/2018 11:06 AM EST ----- Please let pt know her iron study and CEA were WNL, no concerns, thanks   Truitt Merle  05/05/2018

## 2018-05-13 ENCOUNTER — Ambulatory Visit (AMBULATORY_SURGERY_CENTER): Payer: Self-pay

## 2018-05-13 VITALS — Ht 62.0 in | Wt 161.0 lb

## 2018-05-13 DIAGNOSIS — Z85038 Personal history of other malignant neoplasm of large intestine: Secondary | ICD-10-CM

## 2018-05-13 MED ORDER — NA SULFATE-K SULFATE-MG SULF 17.5-3.13-1.6 GM/177ML PO SOLN: 1.0000 | Freq: Once | ORAL | 0 refills | Status: AC

## 2018-05-13 NOTE — Progress Notes (Signed)
Per pt, no allergies to soy or egg products.Pt not taking any weight loss meds or using  O2 at home.  Pt refused emmi video. 

## 2018-05-18 ENCOUNTER — Encounter (INDEPENDENT_AMBULATORY_CARE_PROVIDER_SITE_OTHER): Payer: Medicare Other | Admitting: Ophthalmology

## 2018-05-26 ENCOUNTER — Ambulatory Visit (AMBULATORY_SURGERY_CENTER): Payer: Medicare Other | Admitting: Internal Medicine

## 2018-05-26 ENCOUNTER — Encounter: Payer: Self-pay | Admitting: Internal Medicine

## 2018-05-26 VITALS — BP 121/54 | HR 59 | Temp 98.2°F | Resp 13 | Ht 62.0 in | Wt 161.0 lb

## 2018-05-26 DIAGNOSIS — D122 Benign neoplasm of ascending colon: Secondary | ICD-10-CM

## 2018-05-26 DIAGNOSIS — D123 Benign neoplasm of transverse colon: Secondary | ICD-10-CM | POA: Diagnosis not present

## 2018-05-26 DIAGNOSIS — Z85038 Personal history of other malignant neoplasm of large intestine: Secondary | ICD-10-CM | POA: Diagnosis not present

## 2018-05-26 DIAGNOSIS — D124 Benign neoplasm of descending colon: Secondary | ICD-10-CM

## 2018-05-26 MED ORDER — SODIUM CHLORIDE 0.9 % IV SOLN: 500.0000 mL | Freq: Once | INTRAVENOUS | Status: DC

## 2018-05-26 NOTE — Progress Notes (Signed)
Spontaneous respirations throughout. VSS. Resting comfortably. To PACU on room air. Report to  RN. 

## 2018-05-26 NOTE — Progress Notes (Signed)
Pt's states no medical or surgical changes since previsit or office visit. 

## 2018-05-26 NOTE — Progress Notes (Signed)
Called to room to assist during endoscopic procedure.  Patient ID and intended procedure confirmed with present staff. Received instructions for my participation in the procedure from the performing physician.  

## 2018-05-26 NOTE — Op Note (Signed)
[place] Patient Name: [name] Procedure Date: 05/26/2018 2:54 PM MRN: [id number] Endoscopist: Jerene Bears , MD Age: 84 Referring MD:  Date of Birth: 10/16/1934 Gender: Female Account #: [id number] Procedure:                Colonoscopy Indications:              High risk colon cancer surveillance: Personal                            history of colon cancer, Last colonoscopy: January                            2017 Medicines:                Monitored Anesthesia Care Procedure:                Pre-Anesthesia Assessment:                           - Prior to the procedure, a History and Physical                            was performed, and patient medications and                            allergies were reviewed. The patient's tolerance of                            previous anesthesia was also reviewed. The risks                            and benefits of the procedure and the sedation                            options and risks were discussed with the patient.                            All questions were answered, and informed consent                            was obtained. Prior Anticoagulants: The patient has                            taken no previous anticoagulant or antiplatelet                            agents. ASA Grade Assessment: III - A patient with                            severe systemic disease. After reviewing the risks                            and benefits, the patient was deemed in  satisfactory condition to undergo the procedure.                           After obtaining informed consent, the colonoscope                            was passed under direct vision. Throughout the                            procedure, the patient's blood pressure, pulse, and                            oxygen saturations were monitored continuously. The                            Colonoscope was introduced through the sigmoid               colostomy and advanced to the ileocolonic                            anastomosis. The colonoscopy was performed without                            difficulty. The patient tolerated the procedure                            well. The quality of the bowel preparation was good. Scope In: 3:04:30 PM Scope Out: 3:28:31 PM Scope Withdrawal Time: 0 hours 21 minutes 30 seconds  Total Procedure Duration: 0 hours 24 minutes 1 second  Findings:                 There was evidence of a widely patent end colostomy                            in the proximal sigmoid colon. This was                            characterized by healthy appearing mucosa.                           There was evidence of a prior end-to-side                            ileo-colonic anastomosis in the ascending colon.                            This was patent and was characterized by healthy                            appearing mucosa.                           A 6 mm polyp was found in the ascending colon. The  polyp was sessile. The polyp was removed with a                            cold snare. Resection and retrieval were complete.                           Two sessile polyps were found in the transverse                            colon. The polyps were 4 to 5 mm in size. These                            polyps were removed with a cold snare. Resection                            and retrieval were complete.                           Two sessile polyps were found in the descending                            colon. The polyps were 4 to 5 mm in size. These                            polyps were removed with a cold snare. Resection                            and retrieval were complete.                           A 10 mm polyp was found in the distal descending                            colon. The polyp was sessile. The polyp was removed                            with a hot snare. Resection  and retrieval were                            complete. Complications:            No immediate complications. Estimated Blood Loss:     Estimated blood loss was minimal. Impression:               - Widely patent end colostomy with healthy                            appearing mucosa in the proximal sigmoid colon.                           - Patent end-to-side ileo-colonic anastomosis,                            characterized by healthy appearing  mucosa.                           - One 6 mm polyp in the ascending colon, removed                            with a cold snare. Resected and retrieved.                           - Two 4 to 5 mm polyps in the transverse colon,                            removed with a cold snare. Resected and retrieved.                           - Two 4 to 5 mm polyps in the descending colon,                            removed with a cold snare. Resected and retrieved.                           - One 10 mm polyp in the distal descending colon,                            removed with a hot snare. Resected and retrieved. Recommendation:           - Patient has a contact number available for                            emergencies. The signs and symptoms of potential                            delayed complications were discussed with the                            patient. Return to normal activities tomorrow.                            Written discharge instructions were provided to the                            patient.                           - Resume previous diet.                           - Continue present medications.                           - Await pathology results.                           - Repeat colonoscopy is recommended for  surveillance. The colonoscopy date will be                            determined after pathology results from today's                            exam become available for review.                            - No ibuprofen, naproxen, or other non-steroidal                            anti-inflammatory drugs for 2 weeks after polyp                            removal. Jerene Bears, MD 05/26/2018 3:37:58 PM This report has been signed electronically.

## 2018-05-26 NOTE — Patient Instructions (Signed)
Discharge instructions given. Handout on polyps. No ibuprofen,naproxen,or other anti-inflammatory drugs for 2 weeks. Resume previous medications. YOU HAD AN ENDOSCOPIC PROCEDURE TODAY AT [facility]:   Refer to the procedure report that was given to you for any specific questions about what was found during the examination.  If the procedure report does not answer your questions, please call your gastroenterologist to clarify.  If you requested that your care partner not be given the details of your procedure findings, then the procedure report has been included in a sealed envelope for you to review at your convenience later.  YOU SHOULD EXPECT: Some feelings of bloating in the abdomen. Passage of more gas than usual.  Walking can help get rid of the air that was put into your GI tract during the procedure and reduce the bloating. If you had a lower endoscopy (such as a colonoscopy or flexible sigmoidoscopy) you may notice spotting of blood in your stool or on the toilet paper. If you underwent a bowel prep for your procedure, you may not have a normal bowel movement for a few days.  Please Note:  You might notice some irritation and congestion in your nose or some drainage.  This is from the oxygen used during your procedure.  There is no need for concern and it should clear up in a day or so.  SYMPTOMS TO REPORT IMMEDIATELY:   Following lower endoscopy (colonoscopy or flexible sigmoidoscopy):  Excessive amounts of blood in the stool  Significant tenderness or worsening of abdominal pains  Swelling of the abdomen that is new, acute  Fever of 100F or higher   For urgent or emergent issues, a gastroenterologist can be reached at any hour by calling [phone].   DIET:  We do recommend a small meal at first, but then you may proceed to your regular diet.  Drink plenty of fluids but you should avoid alcoholic beverages for 24 hours.  ACTIVITY:  You should plan to take it  easy for the rest of today and you should NOT DRIVE or use heavy machinery until tomorrow (because of the sedation medicines used during the test).    FOLLOW UP: Our staff will call the number listed on your records the next business day following your procedure to check on you and address any questions or concerns that you may have regarding the information given to you following your procedure. If we do not reach you, we will leave a message.  However, if you are feeling well and you are not experiencing any problems, there is no need to return our call.  We will assume that you have returned to your regular daily activities without incident.  If any biopsies were taken you will be contacted by phone or by letter within the next 1-3 weeks.  Please call us at [phone] if you have not heard about the biopsies in 3 weeks.    SIGNATURES/CONFIDENTIALITY: You and/or your care partner have signed paperwork which will be entered into your electronic medical record.  These signatures attest to the fact that that the information above on your After Visit Summary has been reviewed and is understood.  Full responsibility of the confidentiality of this discharge information lies with you and/or your care-partner.

## 2018-05-27 ENCOUNTER — Telehealth: Payer: Self-pay

## 2018-05-27 ENCOUNTER — Encounter (INDEPENDENT_AMBULATORY_CARE_PROVIDER_SITE_OTHER): Payer: Medicare Other | Admitting: Ophthalmology

## 2018-05-27 NOTE — Telephone Encounter (Signed)
  Follow up Call-  Call back number 05/26/2018  Post procedure Call Back phone  # [phone]  Permission to leave phone message Yes  Some recent data might be hidden     Patient questions:  Do you have a fever, pain , or abdominal swelling? No. Pain Score  0 *  Have you tolerated food without any problems? Yes.    Have you been able to return to your normal activities? Yes.    Do you have any questions about your discharge instructions: Diet   No. Medications  No. Follow up visit  No.  Do you have questions or concerns about your Care? No.  Actions: * If pain score is 4 or above: No action needed, pain <4.

## 2018-05-28 ENCOUNTER — Encounter (INDEPENDENT_AMBULATORY_CARE_PROVIDER_SITE_OTHER): Payer: Medicare Other | Admitting: Ophthalmology

## 2018-05-28 ENCOUNTER — Encounter (HOSPITAL_COMMUNITY): Payer: Self-pay

## 2018-05-28 DIAGNOSIS — H35033 Hypertensive retinopathy, bilateral: Secondary | ICD-10-CM | POA: Diagnosis not present

## 2018-05-28 DIAGNOSIS — I10 Essential (primary) hypertension: Secondary | ICD-10-CM | POA: Diagnosis not present

## 2018-05-28 DIAGNOSIS — H43813 Vitreous degeneration, bilateral: Secondary | ICD-10-CM | POA: Diagnosis not present

## 2018-05-28 DIAGNOSIS — H1851 Endothelial corneal dystrophy: Secondary | ICD-10-CM

## 2018-05-28 DIAGNOSIS — H353132 Nonexudative age-related macular degeneration, bilateral, intermediate dry stage: Secondary | ICD-10-CM | POA: Diagnosis not present

## 2018-05-28 NOTE — Patient Instructions (Signed)
[name]  05/28/2018   Your procedure is scheduled on: 06-05-18  Report to [facility] Main  Entrance               Report to admitting at     0530  AM    Call this number if you have problems the morning of surgery [phone]    Remember: NO SOLID FOOD AFTER MIDNIGHT THE NIGHT PRIOR TO SURGERY. NOTHING BY MOUTH EXCEPT CLEAR LIQUIDS UNTIL 3 HOURS PRIOR TO [place] SURGERY. PLEASE FINISH ENSURE DRINK PER SURGEON ORDER 3 HOURS PRIOR TO SCHEDULED SURGERY TIME WHICH NEEDS TO BE COMPLETED AT ___0430 am then nothing by mouth_________.   BRUSH YOUR TEETH MORNING OF SURGERY AND RINSE YOUR MOUTH OUT, NO CHEWING GUM CANDY OR MINTS.     Take these medicines the morning of surgery with A SIP OF WATER: crestor, metoprolol, levothyroxine, imdur, hydralazine, flonase, nexium, celexa, amlodipine, inhaler and bring with you                                You may not have any metal on your body including hair pins and              piercings  Do not wear jewelry, make-up, lotions, powders or perfumes, deodorant             Do not wear nail polish.  Do not shave  48 hours prior to surgery.               Do not bring valuables to the hospital. [place].  Contacts, dentures or bridgework may not be worn into surgery.  Leave suitcase in the car. After surgery it may be brought to your room.  =            Please read over the following fact sheets you were given: _____________________________________________________________________             [facility] - Preparing for Surgery Before surgery, you can play an important role.  Because skin is not sterile, your skin needs to be as free of germs as possible.  You can reduce the number of germs on your skin by washing with CHG (chlorahexidine gluconate) soap before surgery.  CHG is an antiseptic cleaner which kills germs and bonds with the skin to continue killing germs even after  washing. Please DO NOT use if you have an allergy to CHG or antibacterial soaps.  If your skin becomes reddened/irritated stop using the CHG and inform your nurse when you arrive at Short Stay. Do not shave (including legs and underarms) for at least 48 hours prior to the first CHG shower.  You may shave your face/neck. Please follow these instructions carefully:  1.  Shower with CHG Soap the night before surgery and the  morning of Surgery.  2.  If you choose to wash your hair, wash your hair first as usual with your  normal  shampoo.  3.  After you shampoo, rinse your hair and body thoroughly to remove the  shampoo.                           4.  Use CHG  as you would any other liquid soap.  You can apply chg directly  to the skin and wash                       Gently with a scrungie or clean washcloth.  5.  Apply the CHG Soap to your body ONLY FROM THE NECK DOWN.   Do not use on face/ open                           Wound or open sores. Avoid contact with eyes, ears mouth and genitals (private parts).                       Wash face,  Genitals (private parts) with your normal soap.             6.  Wash thoroughly, paying special attention to the area where your surgery  will be performed.  7.  Thoroughly rinse your body with warm water from the neck down.  8.  DO NOT shower/wash with your normal soap after using and rinsing off  the CHG Soap.                9.  Pat yourself dry with a clean towel.            10.  Wear clean pajamas.            11.  Place clean sheets on your bed the night of your first shower and do not  sleep with pets. Day of Surgery : Do not apply any lotions/deodorants the morning of surgery.  Please wear clean clothes to the hospital/surgery center.  FAILURE TO FOLLOW THESE INSTRUCTIONS MAY RESULT IN THE CANCELLATION OF YOUR SURGERY PATIENT SIGNATURE_________________________________  NURSE  SIGNATURE__________________________________  ________________________________________________________________________

## 2018-05-28 NOTE — Progress Notes (Signed)
ekg 06-13-17 epic  Echo 05-29-17 epic

## 2018-05-29 ENCOUNTER — Encounter: Payer: Self-pay | Admitting: Internal Medicine

## 2018-05-29 ENCOUNTER — Telehealth: Payer: Self-pay | Admitting: Cardiovascular Disease

## 2018-05-29 ENCOUNTER — Other Ambulatory Visit: Payer: Medicare Other

## 2018-05-29 NOTE — Telephone Encounter (Signed)
Forwarded to [employer]

## 2018-05-29 NOTE — Telephone Encounter (Signed)
Completed.

## 2018-05-29 NOTE — Telephone Encounter (Signed)
Pt is needing an updated surgical clearance for [facility] Surgery. Dr. Bronson Ing gave clearance back in October, but Verner Chol is needing an updated clearance because it's been over 4 months. It's taken a while to get this patient scheduled for the surgery per Elmo Putt.   Elmo Putt is going to refax the form for the clearance

## 2018-06-01 ENCOUNTER — Encounter (HOSPITAL_COMMUNITY): Payer: Self-pay

## 2018-06-01 ENCOUNTER — Encounter (INDEPENDENT_AMBULATORY_CARE_PROVIDER_SITE_OTHER): Payer: Self-pay

## 2018-06-01 ENCOUNTER — Encounter (HOSPITAL_COMMUNITY)
Admission: RE | Admit: 2018-06-01 | Discharge: 2018-06-01 | Disposition: A | Discharge status: Home / Self Care | Payer: Medicare Other | Source: Ambulatory Visit | Attending: Surgery | Admitting: Surgery

## 2018-06-01 ENCOUNTER — Other Ambulatory Visit: Payer: Self-pay

## 2018-06-01 DIAGNOSIS — K449 Diaphragmatic hernia without obstruction or gangrene: Secondary | ICD-10-CM | POA: Diagnosis not present

## 2018-06-01 DIAGNOSIS — Z01812 Encounter for preprocedural laboratory examination: Secondary | ICD-10-CM | POA: Insufficient documentation

## 2018-06-01 HISTORY — DX: Pneumonia, unspecified organism: J18.9

## 2018-06-01 LAB — CBC WITH DIFFERENTIAL/PLATELET
Abs Immature Granulocytes: 0.02 10*3/uL (ref 0.00–0.07)
BASOS ABS: 0 10*3/uL (ref 0.0–0.1)
Basophils Relative: 0 %
EOS ABS: 0.1 10*3/uL (ref 0.0–0.5)
Eosinophils Relative: 2 %
HCT: 42.6 % (ref 36.0–46.0)
Hemoglobin: 12.7 g/dL (ref 12.0–15.0)
Immature Granulocytes: 0 %
Lymphocytes Relative: 22 %
Lymphs Abs: 1.1 10*3/uL (ref 0.7–4.0)
MCH: 26.5 pg (ref 26.0–34.0)
MCHC: 29.8 g/dL — ABNORMAL LOW (ref 30.0–36.0)
MCV: 88.9 fL (ref 80.0–100.0)
Monocytes Absolute: 0.3 10*3/uL (ref 0.1–1.0)
Monocytes Relative: 6 %
Neutro Abs: 3.7 10*3/uL (ref 1.7–7.7)
Neutrophils Relative %: 70 %
PLATELETS: 192 10*3/uL (ref 150–400)
RBC: 4.79 MIL/uL (ref 3.87–5.11)
RDW: 15.7 % — AB (ref 11.5–15.5)
WBC: 5.3 10*3/uL (ref 4.0–10.5)
nRBC: 0 % (ref 0.0–0.2)

## 2018-06-01 LAB — COMPREHENSIVE METABOLIC PANEL
ALT: 14 U/L (ref 0–44)
AST: 20 U/L (ref 15–41)
Albumin: 4.1 g/dL (ref 3.5–5.0)
Alkaline Phosphatase: 58 U/L (ref 38–126)
Anion gap: 6 (ref 5–15)
BUN: 22 mg/dL (ref 8–23)
CHLORIDE: 103 mmol/L (ref 98–111)
CO2: 30 mmol/L (ref 22–32)
Calcium: 9.6 mg/dL (ref 8.9–10.3)
Creatinine, Ser: 1.65 mg/dL — ABNORMAL HIGH (ref 0.44–1.00)
GFR calc Af Amer: 33 mL/min — ABNORMAL LOW (ref >60)
GFR calc non Af Amer: 29 mL/min — ABNORMAL LOW (ref >60)
Glucose, Bld: 113 mg/dL — ABNORMAL HIGH (ref 70–99)
POTASSIUM: 4.4 mmol/L (ref 3.5–5.1)
Sodium: 139 mmol/L (ref 135–145)
Total Bilirubin: 0.5 mg/dL (ref 0.3–1.2)
Total Protein: 6.9 g/dL (ref 6.5–8.1)

## 2018-06-01 NOTE — Progress Notes (Signed)
Clearance Dr. Bronson Ing  05-29-18  On chart

## 2018-06-01 NOTE — Progress Notes (Signed)
GZF:POIPP Jones  CARDIOLOGIST:Dr. Bronson Ing  INFO IN Epic:creatine 1.65 , hx chf, pulm htn Clearance Dr. Bronson Ing on chart cardiac stent 2013   INFO ON CHART:  BLOOD THINNERS AND LAST DOSES:none ____________________________________  PATIENT SYMPTOMS AT TIME OF PREOP:none

## 2018-06-02 NOTE — Anesthesia Preprocedure Evaluation (Deleted)
Anesthesia Evaluation    Airway        Dental   Pulmonary former smoker,           Cardiovascular hypertension,      Neuro/Psych    GI/Hepatic   Endo/Other    Renal/GU      Musculoskeletal   Abdominal   Peds  Hematology   Anesthesia Other Findings   Reproductive/Obstetrics                             Anesthesia Physical Anesthesia Plan  ASA:   Anesthesia Plan:    Post-op Pain Management:    Induction:   PONV Risk Score and Plan:   Airway Management Planned:   Additional Equipment:   Intra-op Plan:   Post-operative Plan:   Informed Consent:   Plan Discussed with:   Anesthesia Plan Comments: (See PAT note 06/01/18, Konrad Felix, PA-C)        Anesthesia Quick Evaluation

## 2018-06-02 NOTE — Progress Notes (Signed)
Anesthesia Chart Review   [place]:  [id number] Date/Time:  06/05/18 0700   Procedures:      XI ROBOTIC REPAIR PARASOPHAGEAL HIATAL HERNIA WITH MESH AND FUNDOPLICATION WITH  LYSIS OF ADHESIONS ERAS PATHWAY (N/A )     ENDOSCOPY (N/A )   Anesthesia type:  General   Pre-op diagnosis:  Recurrent hiatal hernia   Location:  [place] 02 / [facility]   Surgeon:  Michael Boston, MD      DISCUSSION: 84 yo former smoker (20 pack years, quit 04/26/88) with h/o PONV, anxiety, depression, CAD (DES to LAD 2013), CHF, CKD Stage III (stable, followed by nephrologist), colon cancer, a-fib, HTN, GERD, hypothyroidism, recurrent hiatal hernia scheduled for above procedure 06/05/18 with Dr. Michael Boston.   Per cardiologist Dr. Kate Sable, "Can proceed with Surgery" dated 05/29/18.   Pt can proceed with planned procedure barring acute status change.  VS: BP (!) 149/58 (BP Location: Right Arm)   Pulse (!) 53   Temp 36.5 C (Oral)   Resp 18   Ht 5' 2.5" (1.588 m)   Wt 71.7 kg   SpO2 96%   BMI 28.44 kg/m   PROVIDERS: Terald Sleeper, PA-C is PCP   Kate Sable, MD is Cardiologist   Truitt Merle, MD is Medical Concologist   Fran Lowes, MD is Nephrologist  LABS: Labs reviewed: Acceptable for surgery. (all labs ordered are listed, but only abnormal results are displayed)  Labs Reviewed  CBC WITH DIFFERENTIAL/PLATELET - Abnormal; Notable for the following components:      Result Value   MCHC 29.8 (*)    RDW 15.7 (*)    All other components within normal limits  COMPREHENSIVE METABOLIC PANEL - Abnormal; Notable for the following components:   Glucose, Bld 113 (*)    Creatinine, Ser 1.65 (*)    GFR calc non Af Amer 29 (*)    GFR calc Af Amer 33 (*)    All other components within normal limits     IMAGES:   EKG: 06/13/17 Rate 52 bpm Sinus bradycardia    CV: Echo 05/28/17 Study Conclusions  - Left ventricle: The cavity size was normal. Wall thickness was   normal. Systolic  function was normal. The estimated ejection   fraction was in the range of 55% to 60%. Wall motion was normal;   there were no regional wall motion abnormalities. Features are   consistent with a pseudonormal left ventricular filling pattern,   with concomitant abnormal relaxation and increased filling   pressure (grade 2 diastolic dysfunction). Doppler parameters are   consistent with high ventricular filling pressure. - Aortic valve: Valve area (VTI): 1.64 cm^2. Valve area (Vmax):   1.55 cm^2. Valve area (Vmean): 1.65 cm^2. - Mitral valve: Moderately calcified annulus. Normal thickness   leaflets . There was mild regurgitation. - Left atrium: The atrium was severely dilated. - Atrial septum: No defect or patent foramen ovale was identified. - Pulmonary arteries: Systolic pressure was mildly increased. PA   peak pressure: 37 mm Hg (S). - Technically adequate study. Past Medical History:  Diagnosis Date  . Anemia due to blood loss, chronic   . Anxiety state, unspecified   . Atrophy of left kidney   . Cancer ([place])    melanoma on back  . CKD (chronic kidney disease), stage III (HCC)      sees Dr. Lowanda Foster   . Colon cancer ([place]) 2019   stage 3  . Colostomy in place [facility])    in  2016  . Coronary artery disease cardiologist-  dr Bronson Ing   a. Moderate to severe coronary disease involving the left anterior descending artery and right coronary artery.  Sequential stenosis in the LAD is significant.  Right coronary artery is moderate to severely likely nonischemic. Questionable small LVOT obstruction.  No Brockenbrough maneuver was performed.  Catheterization January 2013 w/ PTCA and DES x1 to pLAD b. 05/2014 MV no isch/infarct, EF 60%.  . Depressive disorder, not elsewhere classified   . Diastolic CHF, chronic [facility])    cardiologist-  dr Bronson Ing   pt. denies at preop  . Diversion colitis 2016  . GERD (gastroesophageal reflux disease)   . H/O hiatal hernia   . History of [name]'s  esophagus   . History of melanoma excision    back  . Hypertension   . Hypothyroidism   . Infection of spine ([place])    in hospial  . Macular degeneration    both eyes  . PAF (paroxysmal atrial fibrillation) ([employer])   . Parastomal hernia   . Pneumonia   . PONV (postoperative nausea and vomiting)   . Pulmonary hypertension ([place])    PA systolic [id number] mmHg by echocardiogram 06-17-2015  PA pressure 05-08-2011 by cardiac catheterization -- no sig. pulm. htn)PA saturation 62% thermodilution cardiac index 2.0 thick cardiac index 2.4  . Pure hypercholesterolemia   . S/P drug eluting coronary stent placement 05/08/2011   x1 DES to proximal LAD  . Tubulovillous adenoma of colon   . Wears dentures    lower  . Wears glasses     Past Surgical History:  Procedure Laterality Date  . CARDIAC CATHETERIZATION Left 03/05/2015   Procedure: CENTRAL LINE INSERTION;  Surgeon: Aviva Signs, MD;  Location: [facility];  Service: General;  Laterality: Left;  . CATARACT EXTRACTION W/ INTRAOCULAR LENS  IMPLANT, BILATERAL  ~ 2002  . CHOLECYSTECTOMY N/A 07/18/2016   Procedure: LAPAROSCOPIC CHOLECYSTECTOMY WITH INTRAOPERATIVE CHOLANGIOGRAM;  Surgeon: [name], MD;  Location: [facility];  Service: General;  Laterality: N/A;  . COLECTOMY WITH COLOSTOMY CREATION/HARTMANN PROCEDURE N/A 03/05/2015   Procedure: PARTIAL COLECTOMY WITH COLOSTOMY CREATION/HARTMANN PROCEDURE;  Surgeon: Aviva Signs, MD;  Location: [facility];  Service: General;  Laterality: N/A;  . COLON SURGERY     colostomy bag  LLQ  . ESOPHAGEAL MANOMETRY N/A 06/06/2014   Procedure: ESOPHAGEAL MANOMETRY (EM);  Surgeon: Jerene Bears, MD;  Location: [facility];  Service: Gastroenterology;  Laterality: N/A;  . ESOPHAGOGASTRODUODENOSCOPY (EGD) WITH PROPOFOL N/A 08/04/2015   Procedure: ESOPHAGOGASTRODUODENOSCOPY (EGD) WITH PROPOFOL;  Surgeon: Mauri Pole, MD;  Location: [facility];  Service: Endoscopy;  Laterality: N/A;  . HERNIA REPAIR  2019   left  side/ removed mass  . IR GENERIC HISTORICAL  03/04/2016   IR LUMBAR DISC ASPIRATION W/IMG GUIDE 03/04/2016 Luanne Bras, MD [facility]  . LAPAROSCOPIC NISSEN FUNDOPLICATION N/A 8/56/3149   Procedure: LAPAROSCOPIC  LYSIS OF ADHESIONS, SEGMENTAL GASTRECTOMY, PARTIAL REDUCTION OF HERNIA;  Surgeon: [name], MD;  Location: [facility];  Service: General;  Laterality: N/A;  . LAPAROSCOPIC PARTIAL COLECTOMY N/A 05/07/2017   Procedure: LAPAROSCOPIC  CONVERTED TO OPEN RIGHT COLECTOMY, PARASTOMAL HERNIA  REPAIR;  Surgeon: Leighton Ruff, MD;  Location: [facility];  Service: General;  Laterality: N/A;  . NISSEN FUNDOPLICATION  7026  . PERCUTANEOUS CORONARY STENT INTERVENTION (PCI-S) N/A 05/08/2011   Procedure: PERCUTANEOUS CORONARY STENT INTERVENTION (PCI-S);  Surgeon: Wellington Hampshire, MD;  Location: [facility] C[facility];  Service: Cardiovascular;  Laterality: N/A;  . ROTATOR CUFF  REPAIR  ~ 2001   right  . SHOULDER ARTHROSCOPY  ~ 2004; 2005   left; "joint's wore out"  . TONSILLECTOMY  ~ 1944  . TUBAL LIGATION  1972    MEDICATIONS: . albuterol (PROVENTIL HFA;VENTOLIN HFA) 108 (90 Base) MCG/ACT inhaler  . ALPRAZolam (XANAX) 0.25 MG tablet  . amLODipine (NORVASC) 5 MG tablet  . citalopram (CELEXA) 40 MG tablet  . denosumab (PROLIA) 60 MG/ML SOSY injection  . esomeprazole (NEXIUM) 40 MG capsule  . ferrous sulfate 325 (65 FE) MG EC tablet  . fluticasone (FLONASE) 50 MCG/ACT nasal spray  . furosemide (LASIX) 40 MG tablet  . hydrALAZINE (APRESOLINE) 50 MG tablet  . isosorbide mononitrate (IMDUR) 30 MG 24 hr tablet  . levothyroxine (SYNTHROID, LEVOTHROID) 50 MCG tablet  . loratadine (CLARITIN) 10 MG tablet  . losartan (COZAAR) 100 MG tablet  . Meclizine HCl 25 MG CHEW  . metoprolol succinate (TOPROL-XL) 25 MG 24 hr tablet  . montelukast (SINGULAIR) 10 MG tablet  . nitroGLYCERIN (NITROSTAT) 0.4 MG SL tablet  . nystatin (MYCOSTATIN/NYSTOP) powder  . ondansetron (ZOFRAN) 8 MG tablet  . polyethylene glycol  powder (GAVILAX) powder  . potassium chloride (K-DUR) 10 MEQ tablet  . Propylene Glycol (SYSTANE BALANCE OP)  . rosuvastatin (CRESTOR) 10 MG tablet  . traMADol (ULTRAM) 50 MG tablet  . traZODone (DESYREL) 100 MG tablet  . Vitamin D, Ergocalciferol, (DRISDOL) 50000 units CAPS capsule  . zolpidem (AMBIEN) 10 MG tablet   No current facility-administered medications for this encounter.    Maia Plan [facility] Pre-Surgical Testing [phone] 06/02/18 1:23 PM

## 2018-06-04 MED ORDER — BUPIVACAINE LIPOSOME 1.3 % IJ SUSP
20.0000 mL | Freq: Once | INTRAMUSCULAR | Status: DC
  Filled 2018-06-04: qty 20

## 2018-06-04 NOTE — Anesthesia Preprocedure Evaluation (Addendum)
Anesthesia Evaluation  Patient identified by MRN, date of birth, ID band Patient awake    Reviewed: Allergy & Precautions, NPO status , Patient's Chart, lab work & pertinent test results  History of Anesthesia Complications (+) PONV and history of anesthetic complications  Airway Mallampati: II  TM Distance: >3 FB Neck ROM: Full    Dental no notable dental hx. (+) Lower Dentures   Pulmonary neg pulmonary ROS, former smoker,    Pulmonary exam normal        Cardiovascular hypertension, + CAD and +CHF  Normal cardiovascular exam+ dysrhythmias Atrial Fibrillation   Echo 05/28/17 Study Conclusions  - Left ventricle: The cavity size was normal. Wall thickness was normal. Systolic function was normal. The estimated ejection fraction was in the range of 55% to 60%. Wall motion was normal; there were no regional wall motion abnormalities. Features are consistent with a pseudonormal left ventricular filling pattern, with concomitant abnormal relaxation and increased filling pressure (grade 2 diastolic dysfunction). Doppler parameters are consistent with high ventricular filling pressure. - Aortic valve: Valve area (VTI): 1.64 cm^2. Valve area (Vmax): 1.55 cm^2. Valve area (Vmean): 1.65 cm^2. - Mitral valve: Moderately calcified annulus. Normal thickness leaflets . There was mild regurgitation. - Left atrium: The atrium was severely dilated. - Atrial septum: No defect or patent foramen ovale was    Neuro/Psych PSYCHIATRIC DISORDERS Anxiety Depression TIA Neuromuscular disease    GI/Hepatic hiatal hernia, PUD, GERD  ,  Endo/Other  Hypothyroidism   Renal/GU Renal InsufficiencyRenal disease     Musculoskeletal   Abdominal   Peds  Hematology  (+) anemia ,   Anesthesia Other Findings   Reproductive/Obstetrics                            Anesthesia Physical  Anesthesia Plan  ASA:  III  Anesthesia Plan: General   Post-op Pain Management:    Induction: Intravenous  PONV Risk Score and Plan: 4 or greater and Treatment may vary due to age or medical condition, Dexamethasone, Ondansetron and Diphenhydramine  Airway Management Planned: Oral ETT  Additional Equipment:   Intra-op Plan:   Post-operative Plan: Extubation in OR  Informed Consent: I have reviewed the patients History and Physical, chart, labs and discussed the procedure including the risks, benefits and alternatives for the proposed anesthesia with the patient or authorized representative who has indicated his/her understanding and acceptance.     Dental advisory given  Plan Discussed with: CRNA and Anesthesiologist  Anesthesia Plan Comments:        Anesthesia Quick Evaluation

## 2018-06-05 ENCOUNTER — Inpatient Hospital Stay (HOSPITAL_COMMUNITY): Payer: Medicare Other | Admitting: Certified Registered Nurse Anesthetist

## 2018-06-05 ENCOUNTER — Inpatient Hospital Stay (HOSPITAL_COMMUNITY)
Admission: RE | Admit: 2018-06-05 | Discharge: 2018-06-11 | DRG: 326 | Disposition: A | Discharge status: Home / Self Care | Payer: Medicare Other | Attending: Surgery | Admitting: Surgery

## 2018-06-05 ENCOUNTER — Inpatient Hospital Stay (HOSPITAL_COMMUNITY): Payer: Medicare Other | Admitting: Physician Assistant

## 2018-06-05 ENCOUNTER — Encounter (HOSPITAL_COMMUNITY): Payer: Self-pay

## 2018-06-05 ENCOUNTER — Other Ambulatory Visit: Payer: Self-pay

## 2018-06-05 ENCOUNTER — Encounter (HOSPITAL_COMMUNITY)
Admission: RE | Disposition: A | Discharge status: Home / Self Care | Payer: Self-pay | Source: Home / Self Care | Attending: Surgery

## 2018-06-05 DIAGNOSIS — R000 Tachycardia, unspecified: Secondary | ICD-10-CM

## 2018-06-05 DIAGNOSIS — Z7901 Long term (current) use of anticoagulants: Secondary | ICD-10-CM

## 2018-06-05 DIAGNOSIS — D500 Iron deficiency anemia secondary to blood loss (chronic): Secondary | ICD-10-CM | POA: Diagnosis present

## 2018-06-05 DIAGNOSIS — I340 Nonrheumatic mitral (valve) insufficiency: Secondary | ICD-10-CM | POA: Diagnosis not present

## 2018-06-05 DIAGNOSIS — Z9049 Acquired absence of other specified parts of digestive tract: Secondary | ICD-10-CM

## 2018-06-05 DIAGNOSIS — R4702 Dysphasia: Secondary | ICD-10-CM | POA: Diagnosis not present

## 2018-06-05 DIAGNOSIS — K581 Irritable bowel syndrome with constipation: Secondary | ICD-10-CM | POA: Diagnosis present

## 2018-06-05 DIAGNOSIS — Z961 Presence of intraocular lens: Secondary | ICD-10-CM | POA: Diagnosis not present

## 2018-06-05 DIAGNOSIS — K440 Diaphragmatic hernia with obstruction, without gangrene: Principal | ICD-10-CM | POA: Diagnosis present

## 2018-06-05 DIAGNOSIS — H3530 Unspecified macular degeneration: Secondary | ICD-10-CM | POA: Diagnosis present

## 2018-06-05 DIAGNOSIS — I130 Hypertensive heart and chronic kidney disease with heart failure and stage 1 through stage 4 chronic kidney disease, or unspecified chronic kidney disease: Secondary | ICD-10-CM | POA: Diagnosis present

## 2018-06-05 DIAGNOSIS — Z8249 Family history of ischemic heart disease and other diseases of the circulatory system: Secondary | ICD-10-CM

## 2018-06-05 DIAGNOSIS — N183 Chronic kidney disease, stage 3 unspecified: Secondary | ICD-10-CM | POA: Diagnosis present

## 2018-06-05 DIAGNOSIS — Z8719 Personal history of other diseases of the digestive system: Secondary | ICD-10-CM

## 2018-06-05 DIAGNOSIS — K3189 Other diseases of stomach and duodenum: Secondary | ICD-10-CM | POA: Diagnosis present

## 2018-06-05 DIAGNOSIS — I272 Pulmonary hypertension, unspecified: Secondary | ICD-10-CM | POA: Diagnosis not present

## 2018-06-05 DIAGNOSIS — I480 Paroxysmal atrial fibrillation: Secondary | ICD-10-CM | POA: Diagnosis present

## 2018-06-05 DIAGNOSIS — I4819 Other persistent atrial fibrillation: Secondary | ICD-10-CM | POA: Diagnosis present

## 2018-06-05 DIAGNOSIS — Z85820 Personal history of malignant melanoma of skin: Secondary | ICD-10-CM

## 2018-06-05 DIAGNOSIS — Z79899 Other long term (current) drug therapy: Secondary | ICD-10-CM

## 2018-06-05 DIAGNOSIS — E039 Hypothyroidism, unspecified: Secondary | ICD-10-CM | POA: Diagnosis present

## 2018-06-05 DIAGNOSIS — I248 Other forms of acute ischemic heart disease: Secondary | ICD-10-CM | POA: Diagnosis present

## 2018-06-05 DIAGNOSIS — Z808 Family history of malignant neoplasm of other organs or systems: Secondary | ICD-10-CM

## 2018-06-05 DIAGNOSIS — K219 Gastro-esophageal reflux disease without esophagitis: Secondary | ICD-10-CM | POA: Diagnosis present

## 2018-06-05 DIAGNOSIS — Z7951 Long term (current) use of inhaled steroids: Secondary | ICD-10-CM | POA: Diagnosis not present

## 2018-06-05 DIAGNOSIS — Z9841 Cataract extraction status, right eye: Secondary | ICD-10-CM

## 2018-06-05 DIAGNOSIS — Z8661 Personal history of infections of the central nervous system: Secondary | ICD-10-CM

## 2018-06-05 DIAGNOSIS — F411 Generalized anxiety disorder: Secondary | ICD-10-CM | POA: Diagnosis present

## 2018-06-05 DIAGNOSIS — I5032 Chronic diastolic (congestive) heart failure: Secondary | ICD-10-CM | POA: Diagnosis present

## 2018-06-05 DIAGNOSIS — Z933 Colostomy status: Secondary | ICD-10-CM

## 2018-06-05 DIAGNOSIS — Z955 Presence of coronary angioplasty implant and graft: Secondary | ICD-10-CM

## 2018-06-05 DIAGNOSIS — K2270 Barrett's esophagus without dysplasia: Secondary | ICD-10-CM | POA: Diagnosis present

## 2018-06-05 DIAGNOSIS — K660 Peritoneal adhesions (postprocedural) (postinfection): Secondary | ICD-10-CM | POA: Diagnosis present

## 2018-06-05 DIAGNOSIS — K228 Other specified diseases of esophagus: Secondary | ICD-10-CM | POA: Diagnosis present

## 2018-06-05 DIAGNOSIS — Z8711 Personal history of peptic ulcer disease: Secondary | ICD-10-CM

## 2018-06-05 DIAGNOSIS — Z79891 Long term (current) use of opiate analgesic: Secondary | ICD-10-CM

## 2018-06-05 DIAGNOSIS — I25119 Atherosclerotic heart disease of native coronary artery with unspecified angina pectoris: Secondary | ICD-10-CM | POA: Diagnosis not present

## 2018-06-05 DIAGNOSIS — Z79890 Hormone replacement therapy (postmenopausal): Secondary | ICD-10-CM

## 2018-06-05 DIAGNOSIS — F329 Major depressive disorder, single episode, unspecified: Secondary | ICD-10-CM | POA: Diagnosis present

## 2018-06-05 DIAGNOSIS — D631 Anemia in chronic kidney disease: Secondary | ICD-10-CM | POA: Diagnosis present

## 2018-06-05 DIAGNOSIS — Z9842 Cataract extraction status, left eye: Secondary | ICD-10-CM

## 2018-06-05 DIAGNOSIS — Z9889 Other specified postprocedural states: Secondary | ICD-10-CM

## 2018-06-05 DIAGNOSIS — Z882 Allergy status to sulfonamides status: Secondary | ICD-10-CM

## 2018-06-05 DIAGNOSIS — Z7982 Long term (current) use of aspirin: Secondary | ICD-10-CM

## 2018-06-05 DIAGNOSIS — Z87891 Personal history of nicotine dependence: Secondary | ICD-10-CM

## 2018-06-05 DIAGNOSIS — I2510 Atherosclerotic heart disease of native coronary artery without angina pectoris: Secondary | ICD-10-CM | POA: Diagnosis present

## 2018-06-05 DIAGNOSIS — I10 Essential (primary) hypertension: Secondary | ICD-10-CM | POA: Diagnosis not present

## 2018-06-05 DIAGNOSIS — I214 Non-ST elevation (NSTEMI) myocardial infarction: Secondary | ICD-10-CM | POA: Diagnosis not present

## 2018-06-05 DIAGNOSIS — K562 Volvulus: Secondary | ICD-10-CM | POA: Diagnosis present

## 2018-06-05 DIAGNOSIS — Z85038 Personal history of other malignant neoplasm of large intestine: Secondary | ICD-10-CM

## 2018-06-05 HISTORY — DX: Malignant neoplasm of ascending colon: C18.2

## 2018-06-05 HISTORY — DX: Calculus of gallbladder without cholecystitis without obstruction: K80.20

## 2018-06-05 HISTORY — DX: Gastro-esophageal reflux disease without esophagitis: K21.9

## 2018-06-05 HISTORY — DX: Extradural and subdural abscess, unspecified: G06.2

## 2018-06-05 SURGERY — FUNDOPLICATION, NISSEN, ROBOT-ASSISTED, LAPAROSCOPIC
Anesthesia: General | Site: Abdomen

## 2018-06-05 MED ORDER — SIMETHICONE 80 MG PO CHEW: 40.0000 mg | CHEWABLE_TABLET | Freq: Four times a day (QID) | ORAL | Status: DC | PRN

## 2018-06-05 MED ORDER — NYSTATIN 100000 UNIT/GM EX POWD
1.0000 | CUTANEOUS | Status: DC | PRN
  Filled 2018-06-05: qty 15

## 2018-06-05 MED ORDER — DEXAMETHASONE SODIUM PHOSPHATE 10 MG/ML IJ SOLN
INTRAMUSCULAR | Status: DC | PRN
  Administered 2018-06-05: 4 mg via INTRAVENOUS

## 2018-06-05 MED ORDER — ISOSORBIDE MONONITRATE ER 30 MG PO TB24
30.0000 mg | ORAL_TABLET | Freq: Every morning | ORAL | Status: DC
  Administered 2018-06-06 – 2018-06-11 (×6): 30 mg via ORAL
  Filled 2018-06-05 (×6): qty 1

## 2018-06-05 MED ORDER — ENOXAPARIN SODIUM 40 MG/0.4ML ~~LOC~~ SOLN
40.0000 mg | SUBCUTANEOUS | Status: DC
  Administered 2018-06-06 – 2018-06-08 (×3): 40 mg via SUBCUTANEOUS
  Filled 2018-06-05 (×3): qty 0.4

## 2018-06-05 MED ORDER — GLYCOPYRROLATE PF 0.2 MG/ML IJ SOSY: PREFILLED_SYRINGE | INTRAMUSCULAR | Status: DC | PRN

## 2018-06-05 MED ORDER — HYDROCORTISONE 1 % EX CREA
1.0000 "application " | TOPICAL_CREAM | Freq: Three times a day (TID) | CUTANEOUS | Status: DC | PRN
  Filled 2018-06-05: qty 28

## 2018-06-05 MED ORDER — ORAL CARE MOUTH RINSE
15.0000 mL | Freq: Two times a day (BID) | OROMUCOSAL | Status: DC
  Administered 2018-06-06 – 2018-06-10 (×7): 15 mL via OROMUCOSAL

## 2018-06-05 MED ORDER — ALBUTEROL SULFATE HFA 108 (90 BASE) MCG/ACT IN AERS
INHALATION_SPRAY | RESPIRATORY_TRACT | Status: AC
  Filled 2018-06-05: qty 6.7

## 2018-06-05 MED ORDER — ALBUMIN HUMAN 5 % IV SOLN
INTRAVENOUS | Status: DC | PRN
  Administered 2018-06-05 (×3): via INTRAVENOUS

## 2018-06-05 MED ORDER — ONDANSETRON 4 MG PO TBDP
4.0000 mg | ORAL_TABLET | Freq: Four times a day (QID) | ORAL | Status: DC | PRN
  Administered 2018-06-05: 4 mg via ORAL
  Filled 2018-06-05 (×2): qty 1

## 2018-06-05 MED ORDER — FLUTICASONE PROPIONATE 50 MCG/ACT NA SUSP
2.0000 | Freq: Every day | NASAL | Status: DC
  Administered 2018-06-06 – 2018-06-11 (×5): 2 via NASAL
  Filled 2018-06-05 (×2): qty 16

## 2018-06-05 MED ORDER — DEXAMETHASONE SODIUM PHOSPHATE 10 MG/ML IJ SOLN
INTRAMUSCULAR | Status: AC
  Filled 2018-06-05: qty 1

## 2018-06-05 MED ORDER — PHENYLEPHRINE HCL 10 MG/ML IJ SOLN
INTRAMUSCULAR | Status: AC
  Filled 2018-06-05: qty 1

## 2018-06-05 MED ORDER — ACETAMINOPHEN 500 MG PO TABS
1000.0000 mg | ORAL_TABLET | ORAL | Status: AC
  Administered 2018-06-05: 1000 mg via ORAL
  Filled 2018-06-05: qty 2

## 2018-06-05 MED ORDER — GLYCOPYRROLATE PF 0.2 MG/ML IJ SOSY
PREFILLED_SYRINGE | INTRAMUSCULAR | Status: AC
  Filled 2018-06-05: qty 1

## 2018-06-05 MED ORDER — SUCCINYLCHOLINE CHLORIDE 200 MG/10ML IV SOSY
PREFILLED_SYRINGE | INTRAVENOUS | Status: DC | PRN
  Administered 2018-06-05: 140 mg via INTRAVENOUS

## 2018-06-05 MED ORDER — ALBUMIN HUMAN 5 % IV SOLN
INTRAVENOUS | Status: AC
  Filled 2018-06-05: qty 250

## 2018-06-05 MED ORDER — SUGAMMADEX SODIUM 200 MG/2ML IV SOLN
INTRAVENOUS | Status: AC
  Filled 2018-06-05: qty 2

## 2018-06-05 MED ORDER — ONDANSETRON HCL 4 MG/2ML IJ SOLN
INTRAMUSCULAR | Status: DC | PRN
  Administered 2018-06-05: 4 mg via INTRAVENOUS

## 2018-06-05 MED ORDER — LACTATED RINGERS IV SOLN
1000.0000 mL | Freq: Three times a day (TID) | INTRAVENOUS | Status: DC | PRN
  Administered 2018-06-05: 1000 mL via INTRAVENOUS

## 2018-06-05 MED ORDER — HYDROMORPHONE HCL 1 MG/ML IJ SOLN: 0.5000 mg | INTRAMUSCULAR | Status: DC | PRN

## 2018-06-05 MED ORDER — MONTELUKAST SODIUM 10 MG PO TABS
10.0000 mg | ORAL_TABLET | Freq: Every day | ORAL | Status: DC
  Administered 2018-06-05 – 2018-06-10 (×6): 10 mg via ORAL
  Filled 2018-06-05 (×6): qty 1

## 2018-06-05 MED ORDER — PROCHLORPERAZINE MALEATE 10 MG PO TABS
10.0000 mg | ORAL_TABLET | Freq: Four times a day (QID) | ORAL | Status: DC | PRN
  Filled 2018-06-05: qty 1

## 2018-06-05 MED ORDER — LIDOCAINE 2% (20 MG/ML) 5 ML SYRINGE
INTRAMUSCULAR | Status: DC | PRN
  Administered 2018-06-05: 1.5 mg/kg/h via INTRAVENOUS

## 2018-06-05 MED ORDER — METRONIDAZOLE IN NACL 5-0.79 MG/ML-% IV SOLN
500.0000 mg | INTRAVENOUS | Status: AC
  Administered 2018-06-05: 500 mg via INTRAVENOUS
  Filled 2018-06-05: qty 100

## 2018-06-05 MED ORDER — HYDROCORTISONE 2.5 % RE CREA
1.0000 "application " | TOPICAL_CREAM | Freq: Four times a day (QID) | RECTAL | Status: DC | PRN
  Filled 2018-06-05: qty 28.35

## 2018-06-05 MED ORDER — PROCHLORPERAZINE EDISYLATE 10 MG/2ML IJ SOLN
5.0000 mg | Freq: Four times a day (QID) | INTRAMUSCULAR | Status: DC | PRN
  Filled 2018-06-05: qty 2

## 2018-06-05 MED ORDER — ZOLPIDEM TARTRATE 5 MG PO TABS: 5.0000 mg | ORAL_TABLET | Freq: Every evening | ORAL | Status: DC | PRN

## 2018-06-05 MED ORDER — FENTANYL CITRATE (PF) 100 MCG/2ML IJ SOLN: 25.0000 ug | INTRAMUSCULAR | Status: DC | PRN

## 2018-06-05 MED ORDER — LORATADINE 10 MG PO TABS
10.0000 mg | ORAL_TABLET | Freq: Every day | ORAL | Status: DC
  Administered 2018-06-05 – 2018-06-10 (×6): 10 mg via ORAL
  Filled 2018-06-05 (×5): qty 1

## 2018-06-05 MED ORDER — GABAPENTIN 300 MG PO CAPS
300.0000 mg | ORAL_CAPSULE | ORAL | Status: AC
  Administered 2018-06-05: 300 mg via ORAL
  Filled 2018-06-05: qty 1

## 2018-06-05 MED ORDER — ONDANSETRON HCL 4 MG/2ML IJ SOLN
INTRAMUSCULAR | Status: AC
  Filled 2018-06-05: qty 2

## 2018-06-05 MED ORDER — ROCURONIUM BROMIDE 100 MG/10ML IV SOLN
INTRAVENOUS | Status: AC
  Filled 2018-06-05: qty 1

## 2018-06-05 MED ORDER — ALBUTEROL SULFATE (2.5 MG/3ML) 0.083% IN NEBU: 3.0000 mL | INHALATION_SOLUTION | Freq: Four times a day (QID) | RESPIRATORY_TRACT | Status: DC | PRN

## 2018-06-05 MED ORDER — METHOCARBAMOL 750 MG PO TABS
750.0000 mg | ORAL_TABLET | Freq: Four times a day (QID) | ORAL | Status: DC | PRN
  Administered 2018-06-06: 750 mg via ORAL
  Filled 2018-06-05: qty 2

## 2018-06-05 MED ORDER — POLYVINYL ALCOHOL 1.4 % OP SOLN
1.0000 [drp] | Freq: Every day | OPHTHALMIC | Status: DC | PRN
  Filled 2018-06-05: qty 15

## 2018-06-05 MED ORDER — GUAIFENESIN-DM 100-10 MG/5ML PO SYRP: 10.0000 mL | ORAL_SOLUTION | ORAL | Status: DC | PRN

## 2018-06-05 MED ORDER — LACTATED RINGERS IV SOLN
INTRAVENOUS | Status: DC | PRN
  Administered 2018-06-05: 07:00:00 via INTRAVENOUS

## 2018-06-05 MED ORDER — LEVOTHYROXINE SODIUM 50 MCG PO TABS
50.0000 ug | ORAL_TABLET | Freq: Every day | ORAL | Status: DC
  Administered 2018-06-06 – 2018-06-11 (×6): 50 ug via ORAL
  Filled 2018-06-05 (×6): qty 1

## 2018-06-05 MED ORDER — LIDOCAINE 2% (20 MG/ML) 5 ML SYRINGE
INTRAMUSCULAR | Status: AC
  Filled 2018-06-05: qty 5

## 2018-06-05 MED ORDER — GLYCOPYRROLATE PF 0.2 MG/ML IJ SOSY
PREFILLED_SYRINGE | INTRAMUSCULAR | Status: AC
  Filled 2018-06-05: qty 2

## 2018-06-05 MED ORDER — SODIUM CHLORIDE 0.9 % IV SOLN
INTRAVENOUS | Status: DC | PRN
  Administered 2018-06-05: 20 ug/min via INTRAVENOUS
  Administered 2018-06-05: 13:00:00 via INTRAVENOUS

## 2018-06-05 MED ORDER — NITROGLYCERIN 0.4 MG SL SUBL: 0.4000 mg | SUBLINGUAL_TABLET | SUBLINGUAL | Status: DC | PRN

## 2018-06-05 MED ORDER — DIPHENHYDRAMINE HCL 12.5 MG/5ML PO ELIX: 12.5000 mg | ORAL_SOLUTION | Freq: Four times a day (QID) | ORAL | Status: DC | PRN

## 2018-06-05 MED ORDER — LIP MEDEX EX OINT
1.0000 "application " | TOPICAL_OINTMENT | Freq: Two times a day (BID) | CUTANEOUS | Status: DC
  Administered 2018-06-05 – 2018-06-11 (×12): 1 via TOPICAL
  Filled 2018-06-05: qty 7

## 2018-06-05 MED ORDER — DIPHENHYDRAMINE HCL 50 MG/ML IJ SOLN: 12.5000 mg | Freq: Four times a day (QID) | INTRAMUSCULAR | Status: DC | PRN

## 2018-06-05 MED ORDER — MENTHOL 3 MG MT LOZG
1.0000 | LOZENGE | OROMUCOSAL | Status: DC | PRN
  Filled 2018-06-05: qty 9

## 2018-06-05 MED ORDER — ROCURONIUM BROMIDE 50 MG/5ML IV SOSY
PREFILLED_SYRINGE | INTRAVENOUS | Status: DC | PRN
  Administered 2018-06-05 (×3): 20 mg via INTRAVENOUS
  Administered 2018-06-05: 50 mg via INTRAVENOUS

## 2018-06-05 MED ORDER — LIDOCAINE 2% (20 MG/ML) 5 ML SYRINGE
INTRAMUSCULAR | Status: DC | PRN
  Administered 2018-06-05: 80 mg via INTRAVENOUS

## 2018-06-05 MED ORDER — BUPIVACAINE LIPOSOME 1.3 % IJ SUSP
INTRAMUSCULAR | Status: DC | PRN
  Administered 2018-06-05: 20 mL

## 2018-06-05 MED ORDER — FENTANYL CITRATE (PF) 100 MCG/2ML IJ SOLN
INTRAMUSCULAR | Status: DC | PRN
  Administered 2018-06-05: 50 ug via INTRAVENOUS
  Administered 2018-06-05 (×2): 25 ug via INTRAVENOUS
  Administered 2018-06-05: 100 ug via INTRAVENOUS
  Administered 2018-06-05: 50 ug via INTRAVENOUS

## 2018-06-05 MED ORDER — MAGIC MOUTHWASH
15.0000 mL | Freq: Four times a day (QID) | ORAL | Status: DC | PRN
  Filled 2018-06-05: qty 15

## 2018-06-05 MED ORDER — FENTANYL CITRATE (PF) 250 MCG/5ML IJ SOLN
INTRAMUSCULAR | Status: AC
  Filled 2018-06-05: qty 5

## 2018-06-05 MED ORDER — BUPIVACAINE-EPINEPHRINE 0.25% -1:200000 IJ SOLN
INTRAMUSCULAR | Status: DC | PRN
  Administered 2018-06-05: 30 mL

## 2018-06-05 MED ORDER — PHENOL 1.4 % MT LIQD
1.0000 | OROMUCOSAL | Status: DC | PRN
  Filled 2018-06-05: qty 177

## 2018-06-05 MED ORDER — SUGAMMADEX SODIUM 500 MG/5ML IV SOLN
INTRAVENOUS | Status: AC
  Filled 2018-06-05: qty 5

## 2018-06-05 MED ORDER — BISACODYL 10 MG RE SUPP: 10.0000 mg | Freq: Every day | RECTAL | Status: DC | PRN

## 2018-06-05 MED ORDER — PROMETHAZINE HCL 25 MG/ML IJ SOLN: 6.2500 mg | INTRAMUSCULAR | Status: DC | PRN

## 2018-06-05 MED ORDER — ENALAPRILAT 1.25 MG/ML IV SOLN
0.6250 mg | Freq: Four times a day (QID) | INTRAVENOUS | Status: DC | PRN
  Filled 2018-06-05: qty 1

## 2018-06-05 MED ORDER — MECLIZINE HCL 25 MG PO TABS
25.0000 mg | ORAL_TABLET | Freq: Three times a day (TID) | ORAL | Status: DC | PRN
  Filled 2018-06-05: qty 1

## 2018-06-05 MED ORDER — ALUM & MAG HYDROXIDE-SIMETH 200-200-20 MG/5ML PO SUSP: 30.0000 mL | Freq: Four times a day (QID) | ORAL | Status: DC | PRN

## 2018-06-05 MED ORDER — METOPROLOL TARTRATE 5 MG/5ML IV SOLN
5.0000 mg | Freq: Four times a day (QID) | INTRAVENOUS | Status: DC | PRN
  Administered 2018-06-06: 5 mg via INTRAVENOUS
  Filled 2018-06-05 (×2): qty 5

## 2018-06-05 MED ORDER — SUGAMMADEX SODIUM 500 MG/5ML IV SOLN
INTRAVENOUS | Status: DC | PRN
  Administered 2018-06-05: 300 mg via INTRAVENOUS

## 2018-06-05 MED ORDER — 0.9 % SODIUM CHLORIDE (POUR BTL) OPTIME
TOPICAL | Status: DC | PRN
  Administered 2018-06-05: 2000 mL

## 2018-06-05 MED ORDER — DIPHENHYDRAMINE HCL 50 MG/ML IJ SOLN
INTRAMUSCULAR | Status: AC
  Filled 2018-06-05: qty 1

## 2018-06-05 MED ORDER — ONDANSETRON HCL 4 MG/2ML IJ SOLN
4.0000 mg | Freq: Four times a day (QID) | INTRAMUSCULAR | Status: DC | PRN
  Administered 2018-06-05 – 2018-06-06 (×3): 4 mg via INTRAVENOUS
  Filled 2018-06-05 (×3): qty 2

## 2018-06-05 MED ORDER — ALPRAZOLAM 0.25 MG PO TABS
0.1250 mg | ORAL_TABLET | Freq: Three times a day (TID) | ORAL | Status: DC | PRN
  Administered 2018-06-06 – 2018-06-10 (×8): 0.25 mg via ORAL
  Filled 2018-06-05 (×8): qty 1

## 2018-06-05 MED ORDER — LACTATED RINGERS IV SOLN
INTRAVENOUS | Status: DC
  Administered 2018-06-05 (×3): via INTRAVENOUS

## 2018-06-05 MED ORDER — PROPOFOL 10 MG/ML IV BOLUS
INTRAVENOUS | Status: DC | PRN
  Administered 2018-06-05: 130 mg via INTRAVENOUS

## 2018-06-05 MED ORDER — ACETAMINOPHEN 500 MG PO TABS
1000.0000 mg | ORAL_TABLET | Freq: Three times a day (TID) | ORAL | Status: DC
  Administered 2018-06-05 – 2018-06-11 (×18): 1000 mg via ORAL
  Filled 2018-06-05 (×18): qty 2

## 2018-06-05 MED ORDER — METRONIDAZOLE IN NACL 5-0.79 MG/ML-% IV SOLN
500.0000 mg | Freq: Three times a day (TID) | INTRAVENOUS | Status: AC
  Administered 2018-06-05: 500 mg via INTRAVENOUS
  Filled 2018-06-05: qty 100

## 2018-06-05 MED ORDER — SODIUM CHLORIDE 0.9 % IV SOLN
2.0000 g | INTRAVENOUS | Status: AC
  Administered 2018-06-05: 2 g via INTRAVENOUS
  Filled 2018-06-05: qty 20

## 2018-06-05 MED ORDER — POLYETHYLENE GLYCOL 3350 17 G PO PACK: 17.0000 g | PACK | Freq: Every day | ORAL | Status: DC | PRN

## 2018-06-05 MED ORDER — LIDOCAINE HCL 2 % IJ SOLN
INTRAMUSCULAR | Status: AC
  Filled 2018-06-05: qty 20

## 2018-06-05 MED ORDER — LACTATED RINGERS IR SOLN
Status: DC | PRN
  Administered 2018-06-05: 1000 mL

## 2018-06-05 MED ORDER — ALBUTEROL SULFATE HFA 108 (90 BASE) MCG/ACT IN AERS
INHALATION_SPRAY | RESPIRATORY_TRACT | Status: DC | PRN
  Administered 2018-06-05 (×2): 2 via RESPIRATORY_TRACT

## 2018-06-05 MED ORDER — SUCCINYLCHOLINE CHLORIDE 200 MG/10ML IV SOSY
PREFILLED_SYRINGE | INTRAVENOUS | Status: AC
  Filled 2018-06-05: qty 10

## 2018-06-05 MED ORDER — GABAPENTIN 300 MG PO CAPS
300.0000 mg | ORAL_CAPSULE | Freq: Two times a day (BID) | ORAL | Status: DC
  Administered 2018-06-05 – 2018-06-08 (×7): 300 mg via ORAL
  Filled 2018-06-05 (×7): qty 1

## 2018-06-05 MED ORDER — BUPIVACAINE-EPINEPHRINE (PF) 0.25% -1:200000 IJ SOLN
INTRAMUSCULAR | Status: AC
  Filled 2018-06-05: qty 60

## 2018-06-05 MED ORDER — DIPHENHYDRAMINE HCL 50 MG/ML IJ SOLN
INTRAMUSCULAR | Status: DC | PRN
  Administered 2018-06-05: 12.5 mg via INTRAVENOUS

## 2018-06-05 MED ORDER — PROPOFOL 10 MG/ML IV BOLUS
INTRAVENOUS | Status: AC
  Filled 2018-06-05: qty 20

## 2018-06-05 MED ORDER — HYDRALAZINE HCL 20 MG/ML IJ SOLN: 5.0000 mg | INTRAMUSCULAR | Status: DC | PRN

## 2018-06-05 MED ORDER — GLYCOPYRROLATE 0.2 MG/ML IJ SOLN
INTRAMUSCULAR | Status: DC | PRN
  Administered 2018-06-05 (×2): 0.2 mg via INTRAVENOUS

## 2018-06-05 MED ORDER — SODIUM CHLORIDE 0.9 % IV SOLN
INTRAVENOUS | Status: DC
  Administered 2018-06-05 – 2018-06-06 (×2): via INTRAVENOUS

## 2018-06-05 MED ORDER — ALBUMIN HUMAN 5 % IV SOLN
12.5000 g | Freq: Four times a day (QID) | INTRAVENOUS | Status: AC | PRN
  Filled 2018-06-05: qty 250

## 2018-06-05 SURGICAL SUPPLY — 86 items
APL SWBSTK 6 STRL LF DISP (MISCELLANEOUS) ×2
APPLICATOR COTTON TIP 6 STRL (MISCELLANEOUS) ×2 IMPLANT
APPLICATOR COTTON TIP 6IN STRL (MISCELLANEOUS) ×4
APPLIER CLIP 5 13 M/L LIGAMAX5 (MISCELLANEOUS)
APPLIER CLIP ROT 10 11.4 M/L (STAPLE)
APR CLP MED LRG 11.4X10 (STAPLE)
APR CLP MED LRG 5 ANG JAW (MISCELLANEOUS)
BAG URINE DRAINAGE (UROLOGICAL SUPPLIES) ×3 IMPLANT
BARRIER SKIN 2 3/4 (OSTOMY) ×3 IMPLANT
BARRIER SKIN 2 3/4 INCH (OSTOMY) ×1
BARRIER SKIN OD2.25 2 3/4 FLNG (OSTOMY) IMPLANT
BLADE SURG SZ11 CARB STEEL (BLADE) ×4 IMPLANT
BRR SKN FLT 2.75X2.25 2 PC (OSTOMY) ×2
CATH FOLEY 2WAY SLVR 30CC 24FR (CATHETERS) ×3 IMPLANT
CHLORAPREP W/TINT 26ML (MISCELLANEOUS) ×4 IMPLANT
CLIP APPLIE 5 13 M/L LIGAMAX5 (MISCELLANEOUS) IMPLANT
CLIP APPLIE ROT 10 11.4 M/L (STAPLE) IMPLANT
COVER SURGICAL LIGHT HANDLE (MISCELLANEOUS) ×4 IMPLANT
COVER TIP SHEARS 8 DVNC (MISCELLANEOUS) ×1 IMPLANT
COVER TIP SHEARS 8MM DA VINCI (MISCELLANEOUS) ×2
COVER WAND RF STERILE (DRAPES) ×4 IMPLANT
DECANTER SPIKE VIAL GLASS SM (MISCELLANEOUS) ×4 IMPLANT
DRAIN CHANNEL 19F RND (DRAIN) ×3 IMPLANT
DRAIN PENROSE 18X1/2 LTX STRL (DRAIN) IMPLANT
DRAPE ARM DVNC X/XI (DISPOSABLE) ×8 IMPLANT
DRAPE COLUMN DVNC XI (DISPOSABLE) ×2 IMPLANT
DRAPE DA VINCI XI ARM (DISPOSABLE) ×8
DRAPE DA VINCI XI COLUMN (DISPOSABLE) ×2
DRAPE INCISE IOBAN 66X45 STRL (DRAPES) ×3 IMPLANT
DRAPE WARM FLUID 44X44 (DRAPE) ×4 IMPLANT
DRSG TEGADERM 2-3/8X2-3/4 SM (GAUZE/BANDAGES/DRESSINGS) ×17 IMPLANT
DRSG TEGADERM 4X4.75 (GAUZE/BANDAGES/DRESSINGS) ×3 IMPLANT
ELECT REM PT RETURN 15FT ADLT (MISCELLANEOUS) ×4 IMPLANT
ENDOLOOP SUT PDS II  0 18 (SUTURE)
ENDOLOOP SUT PDS II 0 18 (SUTURE) IMPLANT
EVACUATOR DRAINAGE 10X20 100CC (DRAIN) IMPLANT
EVACUATOR SILICONE 100CC (DRAIN) ×3 IMPLANT
FELT TEFLON 4 X1 (Mesh General) ×3 IMPLANT
GAUZE SPONGE 2X2 8PLY STRL LF (GAUZE/BANDAGES/DRESSINGS) ×2 IMPLANT
GLOVE BIO SURGEON STRL SZ7.5 (GLOVE) ×3 IMPLANT
GLOVE BIOGEL PI IND STRL 7.0 (GLOVE) ×5 IMPLANT
GLOVE BIOGEL PI INDICATOR 7.0 (GLOVE) ×10
GLOVE ECLIPSE 8.0 STRL XLNG CF (GLOVE) ×8 IMPLANT
GLOVE INDICATOR 8.0 STRL GRN (GLOVE) ×8 IMPLANT
GLOVE SURG SS PI 7.0 STRL IVOR (GLOVE) ×6 IMPLANT
GOWN STRL REUS W/TWL XL LVL3 (GOWN DISPOSABLE) ×18 IMPLANT
IRRIG SUCT STRYKERFLOW 2 WTIP (MISCELLANEOUS) ×4
IRRIGATION SUCT STRKRFLW 2 WTP (MISCELLANEOUS) ×2 IMPLANT
KIT BASIN OR (CUSTOM PROCEDURE TRAY) ×4 IMPLANT
KIT PROCEDURE OLYMPUS (MISCELLANEOUS) ×3 IMPLANT
MESH PHASIX RESORB RECT 10X15 (Mesh General) ×3 IMPLANT
NDL INSUFFLATION 14GA 120MM (NEEDLE) IMPLANT
NEEDLE HYPO 22GX1.5 SAFETY (NEEDLE) ×4 IMPLANT
NEEDLE INSUFFLATION 14GA 120MM (NEEDLE) ×4 IMPLANT
PACK CARDIOVASCULAR III (CUSTOM PROCEDURE TRAY) ×4 IMPLANT
PAD POSITIONING PINK XL (MISCELLANEOUS) ×4 IMPLANT
POUCH OSTOMY 2 3/4  H 3804 (WOUND CARE) ×2
POUCH OSTOMY 2 3/4 H 3804 (WOUND CARE) ×2
POUCH OSTOMY 2 PC DRNBL 2.75 (WOUND CARE) ×1 IMPLANT
SCISSORS LAP 5X45 EPIX DISP (ENDOMECHANICALS) ×3 IMPLANT
SEAL CANN UNIV 5-8 DVNC XI (MISCELLANEOUS) ×8 IMPLANT
SEAL XI 5MM-8MM UNIVERSAL (MISCELLANEOUS) ×8
SEALER VESSEL DA VINCI XI (MISCELLANEOUS) ×2
SEALER VESSEL EXT DVNC XI (MISCELLANEOUS) ×2 IMPLANT
SOLUTION ELECTROLUBE (MISCELLANEOUS) ×4 IMPLANT
SPONGE GAUZE 2X2 STER 10/PKG (GAUZE/BANDAGES/DRESSINGS) ×2
SPONGE LAP 18X18 RF (DISPOSABLE) ×4 IMPLANT
SUT ETHIBOND 0 36 GRN (SUTURE) ×8 IMPLANT
SUT ETHIBOND NAB CT1 #1 30IN (SUTURE) ×11 IMPLANT
SUT MNCRL AB 4-0 PS2 18 (SUTURE) ×7 IMPLANT
SUT PROLENE 2 0 SH DA (SUTURE) ×3 IMPLANT
SUT VLOC 180 2-0 9IN GS21 (SUTURE) ×3 IMPLANT
SYR 10ML LL (SYRINGE) ×4 IMPLANT
SYR 20CC LL (SYRINGE) ×4 IMPLANT
TIP INNERVISION DETACH 40FR (MISCELLANEOUS) IMPLANT
TIP INNERVISION DETACH 50FR (MISCELLANEOUS) IMPLANT
TIP INNERVISION DETACH 56FR (MISCELLANEOUS) IMPLANT
TIPS INNERVISION DETACH 40FR (MISCELLANEOUS)
TOWEL OR 17X26 10 PK STRL BLUE (TOWEL DISPOSABLE) ×4 IMPLANT
TOWEL OR NON WOVEN STRL DISP B (DISPOSABLE) ×4 IMPLANT
TRAY FOLEY MTR SLVR 14FR STAT (SET/KITS/TRAYS/PACK) ×3 IMPLANT
TROCAR ADV FIXATION 5X100MM (TROCAR) ×4 IMPLANT
TUBING CONNECTING 10 (TUBING) ×2 IMPLANT
TUBING CONNECTING 10' (TUBING) ×1
TUBING ENDO SMARTCAP (MISCELLANEOUS) ×3 IMPLANT
TUBING INSUFFLATION 10FT LAP (TUBING) ×4 IMPLANT

## 2018-06-05 NOTE — H&P (Signed)
[name]  12-Nov-1934 [id number]  CARE TEAM:  PCP: Terald Sleeper, PA-C  Outpatient Care Team: Patient Care Team: [name] as PCP - General (General Practice) Okey Regal, [place] (Optometry) Herminio Commons, MD as Attending Physician (Cardiology) Truitt Merle, MD as Consulting Physician (Hematology) Leighton Ruff, MD as Consulting Physician (Colon and Rectal Surgery) Michael Boston, MD as Consulting Physician (General Surgery) Pyrtle, Lajuan Lines, MD as Consulting Physician (Gastroenterology) Fran Lowes, MD as Consulting Physician (Nephrology)  Inpatient Treatment Team: Treatment Team: Attending Provider: Michael Boston, MD   Kennie I Baum Documented: 01/05/2018 9:00 AM Location: [facility] Patient #: [id number] DOB: 10-Aug-1934 Divorced / Language: [name] / Race: White Female   History of Present Illness Adin Hector MD; 01/05/2018 10:33 AM) The patient is a 84 year old female who presents with a hiatal hernia. Note for "Hiatal hernia": ` ` ` Patient sent for surgical consultation at the request of Dr Leighton Ruff  Chief Complaint: Upper abdominal bloating and discomfort with recurrent hiatal hernia ` ` The patient is a pleasant woman that required numerous abdominal surgeries. She had a hiatal hernia repaired in an open fashion in 1993 by [name] in our group whom has since retired. She had worsening bloating and discomfort. Followed by [facility] gastro[facility]ology. GI workup showed recurrent hiatal hernia. She underwent attempted laparoscopic redo hiatal hernia repair in 2016 by [name]. Very dense adhesions. Gastrotomy. Stapled off. Repair aborted. There was discussion and consultation at [facility]. Saw [name]. He offered laparoscopic reattempt with probable gastrostomy tube. She did not like being that far away, so held off. She was having some right upper quadrant and back pain or discomfort with gallstones.  Underwent sounds like uneventful cholecystectomy in 2018. That seemed to help some of her symptoms.  In the midst of this she also had a colonic perforation that required open Hartmann resection with peritonitis and fluid collections. Labs in 2016. Very difficult course. Developed polyps more in the proximal colon. Attempted endoscopic resection done. Partial removal the first attempt. She was laid to follow-up and had a much bigger recurrence. Underwent laparoscopic converted to open colectomy on the right side. Converted to open due to dense pelvic adhesions. Primary parastomal hernia repair. Her graft patient notes she thinks she has another hernia around her colostomy but it is small. She's pouching better. She still struggles with chronic constipation. Takes MiraLAX intermittently to move her bowels about twice a week.  Her main complaint is that she is having recurrent upper abdominal bloating. Chronic nausea. She can tolerate most liquids and some solids but will vomit a couple times a month. She's not been losing weight. Occasional burping and belching. She is been trying to avoid breads and meats. Sticking to a low-fat diet. We'll she feels that she got some relief on her right-sided pain after the cholecystectomy, she feels like she has hiatal hernia symptoms as she did years ago. Progressively worsening. She wish to reconsider surgery. She did not want to go to [facility] if she felt it was too far away. She does have a history of coronary disease with stenting over decade ago. She is on blood thinners. No major events. She exercises at the local [facility] every other day. Can walk at least half mile without difficulty. She mentioned her upper abdominal concerns. My partner, [name], offered surgical reevaluation by our group given my expertise in foregut surgery  She had a  normal manometry done 2016. Upper GI 2017 noted moderate size hiatal hernia in  [name] 2017. She's had endoscopies with retained food noted in the past few years. CT of chest notes most of her stomach is flipped and twisted up and to her right chest consistent with chronic torsion.  No new events  Ready for surgery  (Review of systems as stated in this history (HPI) or in the review of systems. Otherwise all other 12 point ROS are negative) ` ` `   Medication History ([name], CMA; 01/05/2018 9:01 AM) Medications Reconciled  Vitals ([name] CMA; 01/05/2018 9:01 AM) 01/05/2018 9:00 AM Weight: 152 lb Height: 62.5in Body Surface Area: 1.71 m Body Mass Index: 27.36 kg/m  Pulse: 56 (Regular)  BP: 112/74 (Sitting, Left Arm, Standard)       Physical Exam Adin Hector MD; 01/05/2018 10:25 AM) General Mental Status-Alert. General Appearance-Not in acute distress, Not Sickly. Orientation-Oriented X3. Hydration-Well hydrated. Voice-Normal.  Integumentary Global Assessment Upon inspection and palpation of skin surfaces of the - Axillae: non-tender, no inflammation or ulceration, no drainage. and Distribution of scalp and body hair is normal. General Characteristics Temperature - normal warmth is noted.  Head and Neck Head-normocephalic, atraumatic with no lesions or palpable masses. Face Global Assessment - atraumatic, no absence of expression. Neck Global Assessment - no abnormal movements, no bruit auscultated on the right, no bruit auscultated on the left, no decreased range of motion, non-tender. Trachea-midline. Thyroid Gland Characteristics - non-tender.  Eye Eyeball - Left-Extraocular movements intact, No Nystagmus. Eyeball - Right-Extraocular movements intact, No Nystagmus. Cornea - Left-No Hazy. Cornea - Right-No Hazy. Sclera/Conjunctiva - Left-No scleral icterus, No Discharge. Sclera/Conjunctiva - Right-No scleral icterus, No Discharge. Pupil - Left-Direct reaction to light  normal. Pupil - Right-Direct reaction to light normal.  ENMT Ears Pinna - Left - no drainage observed, no generalized tenderness observed. Right - no drainage observed, no generalized tenderness observed. Nose and Sinuses External Inspection of the Nose - no destructive lesion observed. Inspection of the nares - Left - quiet respiration. Right - quiet respiration. Mouth and Throat Lips - Upper Lip - no fissures observed, no pallor noted. Lower Lip - no fissures observed, no pallor noted. Nasopharynx - no discharge present. Oral Cavity/Oropharynx - Tongue - no dryness observed. Oral Mucosa - no cyanosis observed. Hypopharynx - no evidence of airway distress observed.  Chest and Lung Exam Inspection Movements - Normal and Symmetrical. Accessory muscles - No use of accessory muscles in breathing. Palpation Palpation of the chest reveals - Non-tender. Auscultation Breath sounds - Normal and Clear.  Cardiovascular Auscultation Rhythm - Regular. Murmurs & Other Heart Sounds - Auscultation of the heart reveals - No Murmurs and No Systolic Clicks.  Abdomen Inspection Inspection of the abdomen reveals - No Visible peristalsis and No Abnormal pulsations. Umbilicus - No Bleeding, No Urine drainage. Palpation/Percussion Palpation and Percussion of the abdomen reveal - Soft, Non Tender, No Rebound tenderness, No Rigidity (guarding) and No Cutaneous hyperesthesia. Note: Abdomen soft. Vertical midline incisions from xiphoid to pubis without hernia. Left lower quadrant colostomy with very mild parastomal hernia easily reducible. Pouching clean but no rash some upper abdominal discomfort but no guarding. No Murphy sign. Not severely distended. No distasis recti. No umbilical or other anterior abdominal wall hernias   Female Genitourinary Sexual Maturity Tanner 5 - Adult hair pattern. Note: No vaginal bleeding nor discharge   Peripheral Vascular Upper Extremity Inspection - Left - No  Cyanotic nailbeds, Not Ischemic. Right -  No Cyanotic nailbeds, Not Ischemic.  Neurologic Neurologic evaluation reveals -normal attention span and ability to concentrate, able to name objects and repeat phrases. Appropriate fund of knowledge , normal sensation and normal coordination. Mental Status Affect - not angry, not paranoid. Cranial Nerves-Normal Bilaterally. Gait-Normal.  Neuropsychiatric Mental status exam performed with findings of-able to articulate well with normal speech/language, rate, volume and coherence, thought content normal with ability to perform basic computations and apply abstract reasoning and no evidence of hallucinations, delusions, obsessions or homicidal/suicidal ideation.  Musculoskeletal Global Assessment Spine, Ribs and Pelvis - no instability, subluxation or laxity. Right Upper Extremity - no instability, subluxation or laxity.  Lymphatic Head & Neck  General Head & Neck Lymphatics: Bilateral - Description - No Localized lymphadenopathy. Axillary  General Axillary Region: Bilateral - Description - No Localized lymphadenopathy. Femoral & Inguinal  Generalized Femoral & Inguinal Lymphatics: Left - Description - No Localized lymphadenopathy. Right - Description - No Localized lymphadenopathy.    Assessment & Plan Adin Hector MD; 01/05/2018 10:24 AM) Nira Conn HIATAL HERNIA (K44.0) Impression: Pleasant elderly but active/functional woman with recurrent hiatal hernia. Symptoms of bloating nausea and pain and occasional vomiting. Suspicious for worsening symptoms/progression.  I think she could benefit from lysis of adhesions with takedown of hiatal hernia. Redo fundoplication. Most likely partial this time despite norm manometry 2 years ago. Ideally with some biologic mesh reinforcement such as Phasix.  Given this being a third atte hiatal hernia repair, low threshold to place a gastrostomy tube this time around for expected postoperative  ileus and other issues. Hopefully will be a temporary issue. Discuss with my colleague, Dr. Rosendo Gros, who also tackles challenging hiatal hernias as well. We usually do together. I did caution the patient that her operative risks are increased given her significant adhesions as noted from prior surgeries. Last attempt aborted due to adhesions & gastrotomy. I would try and do this robotically in the hopes I could have better visualization and dissection. It may not be technically feasible. Her risk of gastric and esophageal injury are increased. Patient would like to stay in town and while she did enjoy [name], does not want to drive that far away or be that far away from family.  I think would be good idea to get cardiac clearance. She seems to have pretty decent exercise tolerance and has had no events off anticoagulation but would reach out her cardiologist.  I think he would be a good idea for her to reach out with gastroenterology again. Have her discuss with Dr. Hilarie Fredrickson to see if he has any other insights or interventions that could be explaining her symptoms. Ensure we've exhausted nonoperative interventions.  Normal manometry 2 years ago. I don't feel strongly in redoing. Reasonable to get an upper GI just to get a sense of the anatomy. If markedly abnormal esophageal anatomy/dysmotility, could consider repeat manometry. In the end, it most likely would not tailor my plan, as I Current Plans I recommended obtaining preoperative cardiac clearance. I am concerned about the health of the patient and the ability to tolerate the operation. Therefore, we will request clearance by cardiology to better assess operative risk & see if a reevaluation, further workup, etc is needed. Also recommendations on how medications such as for anticoagulation and blood pressure should be managed/held/restarted after surgery. I recommended to the patient that they have an evaluation with gastroenterology. See if  endoscopic evaluation would be of benefit. Management of digestive tract issues. Perhaps adjustment of medications or  additions. Follow Up - Call [facility] office after tests / studies doneto discuss further plans Pt Education - [facility] Esophageal Surgery Diet HCI (Edra Riccardi): discussed with patient and provided information. Pt Education - [facility] Laparoscopic Surgery HCI The anatomy & physiology of the foregut and anti-reflux mechanism was discussed. The pathophysiology of hiatal herniation and GERD was discussed. Natural history risks without surgery was discussed. The patient's symptoms are not adequately controlled by medicines and other non-operative treatments. I feel the risks of no intervention will lead to serious problems that outweigh the operative risks; therefore, I recommended surgery to reduce the hiatal hernia out of the chest and fundoplication to rebuild the anti-reflux valve and control reflux better. Need for a thorough workup to rule out the differential diagnosis and plan treatment was explained. I explained laparoscopic techniques with possible need for an open approach.  Risks such as bleeding, infection, abscess, leak, need for further treatment, heart attack, death, and other risks were discussed. I noted a good likelihood this will help address the problem. Goals of post-operative recovery were discussed as well. Possibility that this will not correct all symptoms was explained. Post-operative dysphagia, need for short-term liquid & pureed diet, inability to vomit, possibility of reherniation, possible need for medicines to help control symptoms in addition to surgery were discussed. We will work to minimize complications. Educational handouts further explaining the pathology, treatment options, and dysphagia diet was given as well. Questions were answered. The patient expresses understanding & wishes to proceed with surgery.  PARASTOMAL HERNIA WITHOUT OBSTRUCTION OR GANGRENE  (K43.5) Impression: Small recurrent parastomal hernia status post emergency Hartmann resection for perforated colon. Recent lap converted to open lysis adhesions with proximal colectomy and primary parastomal hernia repair.  She has a small parastomal hernia. I would hold off on trying to do any more aggressive repair at this time. She's pouching better. CHRONIC CONSTIPATION (K59.09) Impression: I strongly recommend that she take her MiraLAX every day just said that she is moving around bowels every day and not having to take a bunch twice a week to overcome constipation. Patients with chronic constipation can struggle with chronic nausea and pain. Current Plans Pt Education - [facility] Constipation (AT) Pt Education - [facility] Good Bowel Health (Delance Weide)   Adin Hector, MD, FACS, MASCRS Gastrointestinal and Minimally Invasive Surgery    [address] [place], [place] [id number] [phone] Main / Paging [phone] Fax    06/05/2018

## 2018-06-05 NOTE — Anesthesia Postprocedure Evaluation (Signed)
Anesthesia Post Note  Patient: [name]  Procedure(s) Performed: XI ROBOTIC REPAIR PARASOPHAGEAL HIATAL HERNIA WITH MESH REINFORCEMENT AND  FUNDOPLICATION, LYSIS OF ADHESIONS X 2 1/2 HOURS, EGD ERAS PATHWAY (N/A Abdomen)     Patient location during evaluation: PACU Anesthesia Type: General Level of consciousness: sedated Pain management: pain level controlled Vital Signs Assessment: post-procedure vital signs reviewed and stable Respiratory status: spontaneous breathing and respiratory function stable Cardiovascular status: stable Postop Assessment: no apparent nausea or vomiting Anesthetic complications: no    Last Vitals:  Vitals:   06/05/18 1515 06/05/18 1530  BP: (!) 146/65 139/63  Pulse: 88 85  Resp: 17 17  Temp:    SpO2: 97% 95%    Last Pain:  Vitals:   06/05/18 1530  TempSrc:   PainSc: 0-No pain                 Jourdain Guay DANIEL

## 2018-06-05 NOTE — Transfer of Care (Signed)
2Immediate Anesthesia Transfer of Care Note  Patient: [name]  Procedure(s) Performed: XI ROBOTIC REPAIR PARASOPHAGEAL HIATAL HERNIA WITH MESH REINFORCEMENT AND  FUNDOPLICATION, LYSIS OF ADHESIONS X 2 1/2 HOURS, EGD ERAS PATHWAY (N/A Abdomen)  Patient Location: PACU  Anesthesia Type:General  Level of Consciousness: drowsy and patient cooperative  Airway & Oxygen Therapy: Patient Spontanous Breathing and Patient connected to face mask oxygen  Post-op Assessment: Report given to RN and Post -op Vital signs reviewed and stable  Post vital signs: Reviewed and stable  Last Vitals:  Vitals Value Taken Time  BP 150/73 06/05/2018  2:15 PM  Temp    Pulse 84 06/05/2018  2:20 PM  Resp 19 06/05/2018  2:20 PM  SpO2 100 % 06/05/2018  2:20 PM  Vitals shown include unvalidated device data.  Last Pain:  Vitals:   06/05/18 0612  TempSrc:   PainSc: 0-No pain      Patients Stated Pain Goal: 3 (00/92/33 0076)  Complications: No apparent anesthesia complications

## 2018-06-05 NOTE — Anesthesia Procedure Notes (Signed)
Procedure Name: Intubation Date/Time: 06/05/2018 7:36 AM Performed by: Montel Clock, CRNA Pre-anesthesia Checklist: Patient identified, Emergency Drugs available, Suction available, Patient being monitored and Timeout performed Patient Re-evaluated:Patient Re-evaluated prior to induction Oxygen Delivery Method: Circle system utilized Preoxygenation: Pre-oxygenation with 100% oxygen Induction Type: IV induction and Rapid sequence Laryngoscope Size: Miller and 2 Grade View: Grade I Tube size: 7.0 mm Number of attempts: 1 Airway Equipment and Method: Stylet Placement Confirmation: ETT inserted through vocal cords under direct vision Secured at: 21 cm Tube secured with: Tape

## 2018-06-05 NOTE — Interval H&P Note (Signed)
History and Physical Interval Note:  06/05/2018 7:20 AM  [name]  has presented today for surgery, with the diagnosis of Recurrent hiatal hernia  The various methods of treatment have been discussed with the patient and family. After consideration of risks, benefits and other options for treatment, the patient has consented to  Procedure(s): XI ROBOTIC [place] (N/A) ENDOSCOPY (N/A) as a surgical intervention .  The patient's history has been reviewed, patient examined, no change in status, stable for surgery.  I have reviewed the patient's chart and labs.  Questions were answered to the patient's satisfaction.    I have re-reviewed the the patient's records, history, medications, and allergies.  I have re-examined the patient.  I again discussed intraoperative plans and goals of post-operative recovery.  The patient agrees to proceed.  [name]  05-Jul-1934 [id number]  Patient Care Team: [name] as PCP - General (General Practice) Okey Regal, [place] (Optometry) Herminio Commons, MD as Attending Physician (Cardiology) Truitt Merle, MD as Consulting Physician (Hematology) Leighton Ruff, MD as Consulting Physician (Colon and Rectal Surgery) Michael Boston, MD as Consulting Physician (General Surgery) Pyrtle, Lajuan Lines, MD as Consulting Physician (Gastroenterology) Fran Lowes, MD as Consulting Physician (Nephrology)  Patient Active Problem List   Diagnosis Date Noted  . Acquired hypothyroidism 01/30/2018  . Cancer of right colon ([place]) 05/20/2017  . Irritable bowel syndrome with constipation 10/15/2016  . Iron deficiency 08/13/2016  . Symptomatic cholelithiasis 07/18/2016  . Discitis of lumbar region L5- S1 03/01/2016  . Epidural abscess L5- S1 03/01/2016  . Chronic diastolic CHF (congestive heart failure) ([place]) 03/01/2016  . DDD (degenerative disc disease), lumbar 02/12/2016   . History of GI bleed 12/18/2015  . Gastric volvulus 08/29/2015  . Erosive esophagitis 08/29/2015  . Atrial fibrillation with RVR ([place]) 08/09/2015  . Irreducible hiatal hernia   . TIA (transient ischemic attack)   . Gastric outlet obstruction   . Pressure ulcer 06/17/2015  . Essential hypertension   . Colonic mass 05/02/2015  . Anemia due to chronic blood loss 05/02/2015  . PAF (paroxysmal atrial fibrillation) ([place]) 05/02/2015  . Anxiety and depression 05/02/2015  . CKD (chronic kidney disease) stage 3, GFR 30-59 ml/min (HCC) 03/03/2015  . History of percutaneous coronary intervention   . Coronary artery disease   . HYPERCHOLESTEROLEMIA 07/16/2007    Past Medical History:  Diagnosis Date  . Anemia due to blood loss, chronic   . Anxiety state, unspecified   . Atrophy of left kidney   . Cancer ([place])    melanoma on back  . CKD (chronic kidney disease), stage III (HCC)      sees Dr. Lowanda Foster   . Colon cancer ([place]) 2019   stage 3  . Colostomy in place [facility])    in 2016  . Coronary artery disease cardiologist-  dr Bronson Ing   a. Moderate to severe coronary disease involving the left anterior descending artery and right coronary artery.  Sequential stenosis in the LAD is significant.  Right coronary artery is moderate to severely likely nonischemic. Questionable small LVOT obstruction.  No Brockenbrough maneuver was performed.  Catheterization January 2013 w/ PTCA and DES x1 to pLAD b. 05/2014 MV no isch/infarct, EF 60%.  . Depressive disorder, not elsewhere classified   . Diastolic CHF, chronic [facility])    cardiologist-  dr Bronson Ing   pt. denies at preop  . Diversion colitis 2016  .  GERD (gastroesophageal reflux disease)   . H/O hiatal hernia   . History of [name]'s esophagus   . History of melanoma excision    back  . Hypertension   . Hypothyroidism   . Infection of spine ([place])    in hospial  . Macular degeneration    both eyes  . PAF (paroxysmal atrial fibrillation)  ([place])   . Parastomal hernia   . Pneumonia   . PONV (postoperative nausea and vomiting)   . Pulmonary hypertension ([place])    PA systolic [id number] mmHg by echocardiogram 06-17-2015  PA pressure 05-08-2011 by cardiac catheterization -- no sig. pulm. htn)PA saturation 62% thermodilution cardiac index 2.0 thick cardiac index 2.4  . Pure hypercholesterolemia   . S/P drug eluting coronary stent placement 05/08/2011   x1 DES to proximal LAD  . Tubulovillous adenoma of colon   . Wears dentures    lower  . Wears glasses     Past Surgical History:  Procedure Laterality Date  . CARDIAC CATHETERIZATION Left 03/05/2015   Procedure: CENTRAL LINE INSERTION;  Surgeon: Aviva Signs, MD;  Location: [facility];  Service: General;  Laterality: Left;  . CATARACT EXTRACTION W/ INTRAOCULAR LENS  IMPLANT, BILATERAL  ~ 2002  . CHOLECYSTECTOMY N/A 07/18/2016   Procedure: LAPAROSCOPIC CHOLECYSTECTOMY WITH INTRAOPERATIVE CHOLANGIOGRAM;  Surgeon: [name], MD;  Location: [facility];  Service: General;  Laterality: N/A;  . COLECTOMY WITH COLOSTOMY CREATION/HARTMANN PROCEDURE N/A 03/05/2015   Procedure: PARTIAL COLECTOMY WITH COLOSTOMY CREATION/HARTMANN PROCEDURE;  Surgeon: Aviva Signs, MD;  Location: [facility];  Service: General;  Laterality: N/A;  . COLON SURGERY     colostomy bag  LLQ  . ESOPHAGEAL MANOMETRY N/A 06/06/2014   Procedure: ESOPHAGEAL MANOMETRY (EM);  Surgeon: Jerene Bears, MD;  Location: [facility] ENDOS[facility];  Service: Gastroenterology;  Laterality: N/A;  . ESOPHAGOGASTRODUODENOSCOPY (EGD) WITH PROPOFOL N/A 08/04/2015   Procedure: ESOPHAGOGASTRODUODENOSCOPY (EGD) WITH PROPOFOL;  Surgeon: Mauri Pole, MD;  Location: [facility] END[facility];  Service: Endoscopy;  Laterality: N/A;  . HERNIA REPAIR  2019   left side/ removed mass  . IR GENERIC HISTORICAL  03/04/2016   IR LUMBAR DISC ASPIRATION W/IMG GUIDE 03/04/2016 Luanne Bras, MD [facility]-INTERV RAD  . LAPAROSCOPIC NISSEN FUNDOPLICATION N/A 2/94/7654   Procedure:  LAPAROSCOPIC  LYSIS OF ADHESIONS, SEGMENTAL GASTRECTOMY, PARTIAL REDUCTION OF HERNIA;  Surgeon: [name], MD;  Location: [facility];  Service: General;  Laterality: N/A;  . LAPAROSCOPIC PARTIAL COLECTOMY N/A 05/07/2017   Procedure: LAPAROSCOPIC  CONVERTED TO OPEN RIGHT COLECTOMY, PARASTOMAL HERNIA  REPAIR;  Surgeon: Leighton Ruff, MD;  Location: [facility];  Service: General;  Laterality: N/A;  . NISSEN FUNDOPLICATION  6503  . PERCUTANEOUS CORONARY STENT INTERVENTION (PCI-S) N/A 05/08/2011   Procedure: PERCUTANEOUS CORONARY STENT INTERVENTION (PCI-S);  Surgeon: Wellington Hampshire, MD;  Location: [facility] CATH LAB;  Service: Cardiovascular;  Laterality: N/A;  . ROTATOR CUFF REPAIR  ~ 2001   right  . SHOULDER ARTHROSCOPY  ~ 2004; 2005   left; "joint's wore out"  . TONSILLECTOMY  ~ 1944  . TUBAL LIGATION  1972    Social History   Socioeconomic History  . Marital status: Divorced    Spouse name: Not on file  . Number of children: 3  . Years of education: Not on file  . Highest education level: Not on file  Occupational History  . Occupation: Retired  Scientific laboratory technician  . Financial resource strain: Not on file  . Food insecurity:    Worry: Not on file  Inability: Not on file  . Transportation needs:    Medical: Not on file    Non-medical: Not on file  Tobacco Use  . Smoking status: Former Smoker    Packs/day: 1.00    Years: 20.00    Pack years: 20.00    Types: Cigarettes    Start date: 10/12/1958    Last attempt to quit: 04/26/1988    Years since quitting: 30.1  . Smokeless tobacco: Never Used  Substance and Sexual Activity  . Alcohol use: No    Alcohol/week: 0.0 standard drinks  . Drug use: No  . Sexual activity: Not Currently  Lifestyle  . Physical activity:    Days per week: Not on file    Minutes per session: Not on file  . Stress: Not on file  Relationships  . Social connections:    Talks on phone: Not on file    Gets together: Not on file    Attends religious service: Not on  file    Active member of club or organization: Not on file    Attends meetings of clubs or organizations: Not on file    Relationship status: Not on file  . Intimate partner violence:    Fear of current or ex partner: Not on file    Emotionally abused: Not on file    Physically abused: Not on file    Forced sexual activity: Not on file  Other Topics Concern  . Not on file  Social History Narrative   Divorced.  Lives alone.  Ambulates with a walker.     Family History  Problem Relation Age of Onset  . Heart attack Father   . Hypertension Father   . Hypertension Mother   . Skin cancer Brother        melanoma  . Atrial fibrillation Sister   . Skin cancer Sister   . Colon cancer Neg Hx   . Esophageal cancer Neg Hx   . Rectal cancer Neg Hx   . Stomach cancer Neg Hx     Medications Prior to Admission  Medication Sig Dispense Refill Last Dose  . albuterol (PROVENTIL HFA;VENTOLIN HFA) 108 (90 Base) MCG/ACT inhaler Inhale 2 puffs into the lungs every 6 (six) hours as needed for wheezing or shortness of breath. 18 g 2 Past Month at Unknown time  . ALPRAZolam (XANAX) 0.25 MG tablet TAKEK 1/2 TO 1 TABLET 3 TIMES DAILY AS NEEDED (Patient taking differently: Take 0.125-0.25 mg by mouth 3 (three) times daily as needed for anxiety. ) 270 tablet 1 06/04/2018 at Unknown time  . amLODipine (NORVASC) 5 MG tablet TAKE ONE (1) TABLET EACH DAY (Patient taking differently: Take 5 mg by mouth daily. ) 90 tablet 3 06/05/2018 at 0300  . citalopram (CELEXA) 40 MG tablet TAKE ONE (1) TABLET EACH DAY (Patient taking differently: Take 40 mg by mouth daily. ) 90 tablet 3 06/04/2018 at Unknown time  . denosumab (PROLIA) 60 MG/ML SOSY injection Inject 60 mg into the skin every 6 (six) months. 1 mL 0 Past Month at Unknown time  . esomeprazole (NEXIUM) 40 MG capsule TAKE ONE (1) CAPSULE EACH DAY (Patient taking differently: Take 40 mg by mouth daily. ) 90 capsule 3 06/05/2018 at 0300  . ferrous sulfate 325 (65 FE) MG EC  tablet Take 325 mg by mouth daily with breakfast.    06/05/2018 at 0300  . fluticasone (FLONASE) 50 MCG/ACT nasal spray USE 2 SPRAYS IN EACH NOSTRIL DAILY (Patient taking differently: Place 2  sprays into both nostrils daily. ) 48 g 9 06/05/2018 at 0300  . furosemide (LASIX) 40 MG tablet Take 40 mg by mouth as needed for fluid.    Past Month at Unknown time  . hydrALAZINE (APRESOLINE) 50 MG tablet TAKE ONE (1) TABLET THREE (3) TIMES EACH DAY (Patient taking differently: Take 50 mg by mouth 3 (three) times daily. ) 270 tablet 3 06/05/2018 at 0300  . isosorbide mononitrate (IMDUR) 30 MG 24 hr tablet Take 1 tablet (30 mg total) by mouth every morning. 90 tablet 3 06/05/2018 at 0300  . levothyroxine (SYNTHROID, LEVOTHROID) 50 MCG tablet TAKE ONE TABLET EACH MORNING BEFORE BREAKFAST (Patient taking differently: Take 50 mcg by mouth daily before breakfast. ) 90 tablet 3 06/05/2018 at 0300  . loratadine (CLARITIN) 10 MG tablet Take 1 tablet (10 mg total) by mouth at bedtime. 90 tablet 3 05/25/2018  . losartan (COZAAR) 100 MG tablet TAKE ONE (1) TABLET EACH DAY (Patient taking differently: Take 50 mg by mouth daily. ) 90 tablet 3 06/04/2018 at Unknown time  . metoprolol succinate (TOPROL-XL) 25 MG 24 hr tablet Take 0.5 tablets (12.5 mg total) by mouth daily. 90 tablet 3 06/04/2018 at Unknown time  . montelukast (SINGULAIR) 10 MG tablet Take 1 tablet (10 mg total) by mouth at bedtime. 90 tablet 3 06/04/2018 at Unknown time  . ondansetron (ZOFRAN) 8 MG tablet Take 1 tablet (8 mg total) by mouth every 8 (eight) hours as needed for nausea or vomiting. 20 tablet 0 06/04/2018 at Unknown time  . polyethylene glycol powder (GAVILAX) powder MIX 17 GRAMS IN 80Z OF WATER/JUICE AND DRINK TWICE DAILY (Patient taking differently: Take 17 g by mouth daily. ) 1020 g 3 06/04/2018 at Unknown time  . potassium chloride (K-DUR) 10 MEQ tablet TAKE ONE (1) TABLET EACH DAY (Patient taking differently: Take 10 mEq by mouth daily as needed (when taking  furosemide). ) 90 tablet 0 Past Month at Unknown time  . Propylene Glycol (SYSTANE BALANCE OP) Apply 1 drop to eye daily as needed (dry eyes).   06/04/2018 at Unknown time  . rosuvastatin (CRESTOR) 10 MG tablet TAKE ONE (1) TABLET EACH DAY (Patient taking differently: Take 10 mg by mouth daily. ) 90 tablet 3 06/04/2018 at Unknown time  . traZODone (DESYREL) 100 MG tablet Take 1 tablet (100 mg total) by mouth at bedtime. 90 tablet 3 06/04/2018 at Unknown time  . Vitamin D, Ergocalciferol, (DRISDOL) 50000 units CAPS capsule TAKE 1 CAPSULE ON TUESDAYS (Patient taking differently: Take 50,000 Units by mouth every Tuesday. ) 12 capsule 3 Past Week at Unknown time  . zolpidem (AMBIEN) 10 MG tablet Take 1 tablet (10 mg total) by mouth at bedtime. (Patient taking differently: Take 10 mg by mouth at bedtime as needed for sleep. ) 90 tablet 1 06/04/2018 at Unknown time  . Meclizine HCl 25 MG CHEW TAKE 1 TABLET 3 TIMES DAILY AS NEEDED FOR DIZZINESS (Patient taking differently: Chew 25 mg by mouth 3 (three) times daily as needed (dizziness). ) 30 each 0 More than a month at Unknown time  . nitroGLYCERIN (NITROSTAT) 0.4 MG SL tablet Place 1 tablet (0.4 mg total) under the tongue every 5 (five) minutes as needed for chest pain. Reported on 04/26/2015 25 tablet 3 More than a month at Unknown time  . nystatin (MYCOSTATIN/NYSTOP) powder Apply topically 4 (four) times daily. (Patient taking differently: Apply 1 Bottle topically as needed (yeast). ) 120 g 5 More than a month at Unknown time  .  traMADol (ULTRAM) 50 MG tablet Take 1 tablet (50 mg total) by mouth every 12 (twelve) hours as needed. (Patient taking differently: Take 50 mg by mouth daily as needed for moderate pain. ) 20 tablet 1 More than a month at Unknown time    Current Facility-Administered Medications  Medication Dose Route Frequency Provider Last Rate Last Dose  . bupivacaine liposome (EXPAREL) 1.3 % injection 266 mg  20 mL Infiltration Once Michael Boston, MD       . cefTRIAXone (ROCEPHIN) 2 g in sodium chloride 0.9 % 100 mL IVPB  2 g Intravenous On Call to OR Michael Boston, MD       And  . metroNIDAZOLE (FLAGYL) IVPB 500 mg  500 mg Intravenous On Call to OR Michael Boston, MD      . lactated ringers infusion   Intravenous Continuous Duane Boston, MD 50 mL/hr at 06/05/18 6168       Allergies  Allergen Reactions  . Sulfamethoxazole Rash    BP (!) 151/66   Pulse 69   Temp (!) 97.4 F (36.3 C) (Oral)   Resp 16   Ht 5\' 2"  (1.575 m)   Wt 71.7 kg   SpO2 98%   BMI 28.90 kg/m   Labs: No results found for this or any previous visit (from the past 48 hour(s)).  Imaging / Studies: No results found.   Adin Hector, M.D., F.A.C.S. Gastrointestinal and Minimally Invasive Surgery [facility] [address], [place], [place] [address] [phone] Main / Paging  06/05/2018 7:21 AM     Adin Hector

## 2018-06-05 NOTE — Op Note (Addendum)
06/05/2018  2:07 PM  PATIENT:  [name]  84 y.o. female  Patient Care Team: Terald Sleeper, PA-C as PCP - General (General Practice) Okey Regal, Spink (Optometry) Herminio Commons, MD as Attending Physician (Cardiology) Truitt Merle, MD as Consulting Physician (Hematology) Leighton Ruff, MD as Consulting Physician (Colon and Rectal Surgery) Michael Boston, MD as Consulting Physician (General Surgery) Pyrtle, Lajuan Lines, MD as Consulting Physician (Gastroenterology) Fran Lowes, MD as Consulting Physician (Nephrology)  PRE-OPERATIVE DIAGNOSIS:  Recurrent hiatal hernia  POST-OPERATIVE DIAGNOSIS:  Incarcerated, recurrent Hiatal Hernia with organoaxial volvulus  PROCEDURE:   XI ROBOTIC REPAIR PARASOPHAGEAL HIATAL HERNIA ABSORBABLE MESH REINFORCEMENT TOUPET FUNDOPLICATION LYSIS OF ADHESIONS X 3 HOURS EGD  SURGEON:  Adin Hector, MD  ASSIST:   Ralene Ok, MD, FACS (90% of [place]) Byrd Hesselbach, RNFA (last 10% of [place])  ANESTHESIA:   local and general  EBL:  Total I/O In: 1750 [I.V.:1000; IV Piggyback:750] Out: 295 [Urine:95; Blood:200]  Delay start of Pharmacological VTE agent (>24hrs) due to surgical blood loss or risk of bleeding:  no  ANESTHESIA: 1. General anesthesia. 2. Local anesthetic in a field block around all port sites.  SPECIMEN:  Mediastinal hernia sac (not sent).  DRAINS:  A 19-French Blake drain goes from the right upper quadrant along the lesser curvature of the stomach into the mediastinum.  COUNTS:  YES  PLAN OF CARE: Admit to inpatient   PATIENT DISPOSITION:  PACU - hemodynamically stable.  INDICATION:   Patient status post open hiatal hernia repair in the 1990s.  Attempt made for a laparoscopic repair in 2016.  Aborted after gastrotomy.  Patient with increasing symptoms and wish to reconsider hiatal hernia repair.  She had tolerated cholecystectomy as well as emergent Hartmann resection and proximal colectomy with colostomy revision in  the past few years.  Cleared by cardiology.  Having worsening dysphasia and crampy pain.  I long discussion with the patient and her family.  Markedly increased risk for esophageal or gastric injury, death, leak, cardiac or pulmonary events, etc. discussed.  :  The anatomy & physiology of the foregut and anti-reflux mechanism was discussed.  The pathophysiology of hiatal herniation and GERD was discussed.  Natural history risks without surgery was discussed.   The patient's symptoms are not adequately controlled by medicines and other non-operative treatments.  I feel the risks of no intervention will lead to serious problems that outweigh the operative risks; therefore, I recommended surgery to reduce the hiatal hernia out of the chest and fundoplication to rebuild the anti-reflux valve and control reflux better.  Need for a thorough workup to rule out the differential diagnosis and plan treatment was explained.  I explained laparoscopic techniques with possible need for an open approach.  Risks such as bleeding, infection, abscess, leak, need for further treatment, heart attack, death, and other risks were discussed.   I noted a good likelihood this will help address the problem.  Goals of post-operative recovery were discussed as well.  Possibility that this will not correct all symptoms was explained.  Post-operative dysphagia, need for short-term liquid & pureed diet, inability to vomit, possibility of reherniation, possible need for medicines to help control symptoms in addition to surgery were discussed.  We will work to minimize complications.   Educational handouts further explaining the pathology, treatment options, and dysphagia diet was given as well.  Questions were answered.  The patient expresses understanding & wishes to proceed with surgery.  OR FINDINGS:   Large recurrent  paraesophageal hiatal hernia with 80% of the stomach in the mediastinum.  Twisted in an organoaxial volvulus.  Dense  adhesions upon itself in a fold.  Stretched out fundus with somewhat of a slipped and partially separated prior fundoplication.  There was a 12 x 10 cm hiatal defect.  It is a primary repair over pledgets.  Mesh reinforcement was used with Mesh was used: Phasix Mesh (a knitted monofilament mesh scaffold using Poly-4-hydroxybutyrate (P4HB), a biologically derived, fully resorbable material)  The patient has a 3.5 Toupet partial 240 degree posterior fundoplication.  The patient has had anterior and posterior gastropexies.  DESCRIPTION:   Informed consent was confirmed.  The patient received IV antibiotics prior to incision.  The underwent general anesthesia without difficulty.  Left lower colostomy appliance was removed.  A large Foley catheter intubated in the end colostomy.  Balloon blown up for occlusion.  A Foley catheter sterilely placed.  The patient was positioned in split leg with arms tucked. The abdomen was prepped and draped in the sterile fashion.  Surgical time-out confirmed our plan.  I placed a 8 mm robotic port in the left upper quadrant after the abdomen had been insufflated in the left subcostal region using Varess entry technique with the patient in steep reverse Trendelenburg and left side up.   We induced carbon dioxide insufflation.  Symmetrical insufflation.  Port placed cleanly. Camera inspection revealed no injury.  Moderate dense cotton candy-like adhesions noted infraumbilically.  These were left alone.  Right side abdomen had less adhesions.  Right upper quadrant had some adhesions system prior cholecystectomy.  Under direct visualization, I placed extra ports.  Some laparoscopic adhesions to free some greater omentum out.  The left lateral sector of the liver was somewhat enlarged with probable fatty change.  It had adhesions to the anterior diaphragm.  Therefore we initially did not use a liver retractor.  The Xi robot was carefully docked and instruments placed and advanced  under direct visualization.  We focused on dissection.  Started out with scissors.  Mainly did cold dissection with some occasional focus cautery.  Occasionally switched over to a vessel sealer when the adhesions were rather thick.  Freed adhesions of the lesser curvature the stomach off the left lateral sector of the liver and got into a clean pocket between the caudate lobe and the right crura and lesser curvature the stomach to help identify the right crus.  I was able to identify the apex of the crura.  Scored peritoneal hernia sac and got into the anterior mediastinum, carefully freeing anterior mediastinal hernia sac off the anterior mediastinum including the pericardium.  Gradually was able to reduce out some greater omentum.  I followed and was able and divide the anterior left crus and come down.  Freed off the transverse colon and some the splenic flexure off its adhesions to the stomach.  Was able to identify and free some greater omentum of adhesions to the stomach and help reduce some of those out of the mediastinum back down into the abdominal cavity away from the stomach.   There are more thicker attachments to the left crura in the left lateral mediastinum.  Suspicious for perhaps some persistent short gastrics versus thickened hernia sac.  I returned back over towards the right side.  Densely sharply freed off adhesions to the right pleura.  Ended up having to breech through the pleura to help identify the thickened hernia sac and thickened medial pleura as well.  Opened that up  broadly to avoid any tension situation.  After finding hernia sac was asked gradually able to free off the fatty thickened mediastinal hernia sac off the pleura.  Ventrally was able to free the entire herniated contents and the large hernia sac off the right lateral central compartment and crura.  Gradually I was able to come down more posteriorly to the posterior mediastinum.  Freed the posterior mediastinal hernia sac  off the aorta and spine.  Started coming over on the left side.  Defined the thickened fatty hernia sac and transected that off the left pleura.    With that, the suction cup affect was more released and I could get to the stomach starting to come out of the mediastinum.  It was quite apparent and had a posterior organoaxial volvulus Ruthell Rummage were essentially the greater curvature the entire stomach had flipped up superiorly and was adherent to both pleura and pericardium.  Eventually untwisted and freed adhesions off of the mediastinum.  Gradually could follow the greater curvature the stomach from the antrum more proximally.  Began to untwist the colon and straighten it out.  Had a large fundus.  Saw an area that was very suspicious for fundoplication as the left anterior part of the wrap came over.  Was able to free off some stomach folds off itself and come up to that fundoplication and transect across it.  With that I was able to free the fundus off the posterior cardia.  Followed that up in the mediastinum to identify clear esophagus and free the remnants of the fundus and posterior wrap off of it to straighten it out.  We grasped the anterior mediastinal sac at the apex of the crus.  Continued dissection until all I had transected phrenoesophageal attachments to the inner right crus, preserving a cuff of mediastinal sac until I found the base of the crura.  I then came around anteriorly on the left side and freed up the phrenoesophageal attachments of the mediastinal sac on the medial part of the left crus on the superior half.  I did careful mediastinal dissection to free the mediastinal sac.  Most of the short gastrics had been ligated off with there was an area that was suspicious for persistent short gastrics.  Transected those with a vessel sealer.  Patient had numerous neo-retroperitoneal attachments to the stomach and wrap.  We carefully isolated and skeletonized and transected those to further  untwist the stomach and have it lay more naturally off tension.  With this, we had circumferential mobilization.  Spent some time freeing off mediastinal hernia sac as well as epiphrenic pads off the proximal stomach to better identify the cardia and esophagus.  We placed the stomach and esophagus on axial tension.  I then did a Type II mediastinal dissection where I freed the esophagus from its attachments to the aorta, spine, pleura, and pericardium using primarily gentle blunt as well as focused ultrasonic dissection.  Her heart and pericardium tended to fall down into our dissection.  I had already done a fair amount of it to get the giant mediastinal hernia sac down.  We saw some wispy anterior left anterior and right posterior vagus nerves.  We work to try and keep most of those intact.  Much of the planes were rather obliterated.  With this I dissected about 20 cm proximally into the mediastinum.    With that I could straighten out the esophagus and get 5 cm of intra-abdominal length of the esophagus at a  best estimation.  I clamped off at the pylorus of the stomach before did EGD.  Was able to pass the scope transorally without difficulty.  The esophagus was moderately dilated and boggy.  Some chronic mucus but no definite retained food.  Dr. Rosendo Gros could see I had entered into the abdominal cavity when I was at about 37 cm.  However I came to the Z line and esophagogastric junction around 43 cm from the incisors.  Specks in the stomach revealed no injury.  No ischemia.  I insufflated the stomach and esophagus with fluid irrigation and saw no evidence of any air leak, arguing against any gastrotomy or esophageal injury.  Stomach laid well without any twisting or torsion.  I desufflated the stomach and removed the endoscope.   I confirmed the the patient had 4 cm of intra-abdominal esophageal length off tension.  At this point I did free the left lateral sector of the liver off the anterior diaphragm.    Dr. Rosendo Gros a 5 mm port in the subxiphoid region under direct visualization.  I removed that and placed an Omega-shaped rigid Nathanson liver retractor to lift the left lateral sector of the liver anteriorly to expose the esophageal hiatus.  This was secured to the bed using the iron man system.  This better expose the right crura so I could free and mobilize it as well.  I brought the fundus of the stomach posterior to the esophagus over to the right side.  The wrap was mobile with the classic shoe shine maneuver.  Wrap became together gently.  We reflected the stomach left laterally and closed the esophageal hiatus using 0 Ethibond stitch using horizontal mattress stitches with pledgets on both sides.  I did that x3 stitches.  The crura had good substance and they came together well without any tension.  Did not fully come together but it was much more snug.  Because of the larger defect, I reinforced the repair using a 15 x 10 cm biologic Phasix mesh.  I cut out a 2x4 cm part of mesh in the middle third of the mesh such that the mesh had a broad U shape transversely, one tail 5 cm wide & the other 8cm.  We brought the mesh in and laid it over the crural repair, tails anterior over the crura.  I tucked the broader "U" tail of the mesh between the left diaphragm and the spleen, the narrower "U" tail over the right crus.  I then secured the posterior & anterior corners of the narrower"U" tail with 0 Ethibond upper interrupted suture to the right crus.  I secured to the left lateral and left superior sides of the broader "U" tail to the left diaphragm band with 2-0 V lock running suture.   I brought the fundus of the stomach behind the esophagus and cardia to set up a fundoplication wrap.  I did a posterior gastropexy by taking of #1 Ethibond stitches to the posterior part of the right side of the wrap and thru the Phasix mesh and crural closure.  The most anterior stitch helped close the hiatus down more  snugly to better affect.  I placed a similar stitch on the left anterior side as well.  That way the stomach covered the mesh and protected it from any esophageal exposure.  With the anterior and posterior gastropexy's, stomach laid well for a fundoplication wrap.   Given her advanced age and boggy appearing esophagus despite manometry looking okay 4 years  ago, I decided to do a partial fundoplication.  I then did a classic 3 cm Toupet fundoplication on the true esophagus above the cardia using Ethibond stitch in the left side of the wrap, then anterior esophagus, and then crura from the most anterior side.  I then did a similar stitch in a mirror image fashion on the right side of the wrap. Did 2 more pairs of stitches .  I measured it and it was 3.5 cm in length.  We removed the bougie.  It was intact.    The wrap was soft and floppy.  I placed a drain as noted above.  I did irrigation and ensured hemostasis.  I saw no evidence of any leak or perforation or other abnormality.  I removed the [facility] liver retractor under direct visualization.  I evacuated carbon dioxide and removed the ports.  The skin was closed with Monocryl and sterile dressings applied.  The patient extubated and looks comfortable in the recovery room.  I discussed postop care in detail with the patient and family in in the office.  Discussed again with the patient in the holding area.  I discussed operative findings, updated the patient's status, discussed probable steps to recovery, and gave postoperative recommendations to the patient's family.  Recommendations were made.  Questions were answered.  They expressed understanding & appreciation.  Adin Hector, M.D., F.A.C.S. Gastrointestinal and Minimally Invasive Surgery [facility] [address], [place], [place] [address] [phone] Main / Paging

## 2018-06-06 ENCOUNTER — Inpatient Hospital Stay (HOSPITAL_COMMUNITY): Payer: Medicare Other

## 2018-06-06 ENCOUNTER — Encounter (HOSPITAL_COMMUNITY): Payer: Self-pay

## 2018-06-06 MED ORDER — METOPROLOL TARTRATE 5 MG/5ML IV SOLN
5.0000 mg | Freq: Once | INTRAVENOUS | Status: AC
  Administered 2018-06-06: 5 mg via INTRAVENOUS
  Filled 2018-06-06: qty 5

## 2018-06-06 MED ORDER — FUROSEMIDE 10 MG/ML IJ SOLN
20.0000 mg | Freq: Once | INTRAMUSCULAR | Status: AC
  Administered 2018-06-06: 20 mg via INTRAVENOUS
  Filled 2018-06-06: qty 2

## 2018-06-06 MED ORDER — DILTIAZEM HCL 100 MG IV SOLR
5.0000 mg/h | INTRAVENOUS | Status: DC
  Administered 2018-06-07: 15 mg/h via INTRAVENOUS
  Administered 2018-06-07: 5 mg/h via INTRAVENOUS
  Filled 2018-06-06 (×4): qty 100

## 2018-06-06 MED ORDER — IOPAMIDOL (ISOVUE-300) INJECTION 61%
150.0000 mL | Freq: Once | INTRAVENOUS | Status: AC | PRN
  Administered 2018-06-06: 75 mL via ORAL

## 2018-06-06 MED ORDER — METOPROLOL SUCCINATE ER 25 MG PO TB24
12.5000 mg | ORAL_TABLET | Freq: Every day | ORAL | Status: DC
  Administered 2018-06-07 – 2018-06-10 (×5): 12.5 mg via ORAL
  Filled 2018-06-06 (×5): qty 1

## 2018-06-06 NOTE — Progress Notes (Signed)
Clarified cardiac monitoring order with Dr. Donne Hazel via phone. Telemetry discontinued.

## 2018-06-06 NOTE — Progress Notes (Signed)
Patient to transfer to 5W 1537 report given to receiving nurse Marcie Bal, all questions answered at this time.  Pt. VSS with no s/s of distress noted.  Pt. Stable at transfer.

## 2018-06-06 NOTE — Progress Notes (Signed)
PT Cancellation Note  Patient Details Name: [name] MRN: [id number] DOB: 1935-02-28   Cancelled Treatment:     PT order received but eval deferred this am - pt in [facility].  WIll follow.   Kais Monje 06/06/2018, 10:44 AM

## 2018-06-06 NOTE — Progress Notes (Signed)
1 Day Post-Op   Subjective/Chief Complaint: Doing ok today, swallow without a leak but slow to empty esophagus   Objective: Vital signs in last 24 hours: Temp:  [97.1 F (36.2 C)-99.4 F (37.4 C)] 98.3 F (36.8 C) (03/07 0735) Pulse Rate:  [73-90] 78 (03/07 0927) Resp:  [14-26] 19 (03/07 0927) BP: (118-171)/(55-99) 167/69 (03/07 0927) SpO2:  [93 %-100 %] 94 % (03/07 0927) Last BM Date: 06/04/18  Intake/Output from previous day: 03/06 0701 - 03/07 0700 In: 4715.2 [I.V.:3865.3; IV Piggyback:849.9] Out: 2065 [Urine:1045; Drains:820; Blood:200] Intake/Output this shift: Total I/O In: 190.9 [I.V.:190.9] Out: 450 [Urine:250; Drains:200]  Resp: clear to auscultation bilaterally Cardio: regular rate and rhythm GI: soft nt/ drain serous ostomy pink incisions clean   Anti-infectives: Anti-infectives (From admission, onward)   Start     Dose/Rate Route Frequency Ordered Stop   06/05/18 1700  metroNIDAZOLE (FLAGYL) IVPB 500 mg     500 mg 100 mL/hr over 60 Minutes Intravenous Every 8 hours 06/05/18 1621 06/05/18 1813   06/05/18 0600  cefTRIAXone (ROCEPHIN) 2 g in sodium chloride 0.9 % 100 mL IVPB     2 g 200 mL/hr over 30 Minutes Intravenous On call to O.R. 06/05/18 0541 06/05/18 1626   06/05/18 0600  metroNIDAZOLE (FLAGYL) IVPB 500 mg     500 mg 100 mL/hr over 60 Minutes Intravenous On call to O.R. 06/05/18 0541 06/05/18 0849      Assessment/Plan: POD 1 peh repair- Gross -tx out of icu today -swallow without leak but slow to empty, will leave on clears today -oob, pulm toilet, ics -lovenox, scds -home meds  Rolm Bookbinder 06/06/2018

## 2018-06-06 NOTE — Evaluation (Signed)
Physical Therapy Evaluation Patient Details Name: [name] MRN: [id number] DOB: Jul 04, 1934 Today's Date: 06/06/2018   History of Present Illness  Pt s/p laparoscopic repair of incarcerated hiatal hernia with lysis of adhesions.  Pt with hx of PAF, CAD, colon CA s/p colostomy, CHF and macular degeneration  Clinical Impression  Pt admitted as above and presenting with functional mobility limitations 2* generalized weakness, post op pain, and ambulatory balance deficits.  Pt should progress to dc home with assist of family/friends    Follow Up Recommendations No PT follow up    Equipment Recommendations  None recommended by PT    Recommendations for Other Services OT consult     Precautions / Restrictions Precautions Precautions: Fall Precaution Comments: colostomy bag, JP drain Restrictions Weight Bearing Restrictions: No      Mobility  Bed Mobility Overal bed mobility: Needs Assistance Bed Mobility: Supine to Sit     Supine to sit: HOB elevated;Min guard        Transfers Overall transfer level: Needs assistance Equipment used: Rolling walker (2 wheeled) Transfers: Sit to/from Stand Sit to Stand: Min assist         General transfer comment: assist to bring wt up and fwd and to balance in initial standing  Ambulation/Gait Ambulation/Gait assistance: Min assist;+2 safety/equipment Gait Distance (Feet): 100 Feet(twice) Assistive device: Rolling walker (2 wheeled) Gait Pattern/deviations: Step-through pattern;Decreased step length - right;Decreased step length - left;Shuffle;Trunk flexed Gait velocity: decr   General Gait Details: cues for posture and position from RW; Pt ambulated 100' before desating to 86% on RA,; pt ambulated additional 100' on 3L but desat to 86%  Stairs            Wheelchair Mobility    Modified Rankin (Stroke Patients Only)       Balance Overall balance assessment: Needs assistance Sitting-balance support: Feet supported;No  upper extremity supported Sitting balance-Leahy Scale: Good     Standing balance support: Bilateral upper extremity supported Standing balance-Leahy Scale: Fair                               Pertinent Vitals/Pain Pain Assessment: 0-10 Pain Score: 5  Pain Location: abdominal area and L shoulder Pain Descriptors / Indicators: Aching;Grimacing;Guarding;Sore Pain Intervention(s): Limited activity within patient's tolerance;Monitored during session;Premedicated before session    [place] expects to be discharged to:: Private residence Living Arrangements: Alone Available Help at Discharge: Family;Friend(s) Type of Home: Apartment Home Access: Level entry     Home Layout: One [place]: [place] - 2 wheels;Walker - 4 wheels;Cane - single point;Bedside commode;Shower seat      Prior Function Level of Independence: Independent               Hand Dominance        Extremity/Trunk Assessment   Upper Extremity Assessment Upper Extremity Assessment: Generalized weakness    Lower Extremity Assessment Lower Extremity Assessment: Generalized weakness    Cervical / Trunk Assessment Cervical / Trunk Assessment: Kyphotic  Communication   Communication: No difficulties  Cognition Arousal/Alertness: Awake/alert Behavior During Therapy: WFL for tasks assessed/performed Overall Cognitive Status: Within Functional Limits for tasks assessed                                        General Comments      Exercises  Assessment/Plan    PT Assessment Patient needs continued PT services  PT Problem List Decreased strength;Decreased activity tolerance;Decreased balance;Decreased mobility;Decreased knowledge of use of DME;Pain       PT Treatment Interventions DME instruction;Gait training;Functional mobility training;Therapeutic activities;Therapeutic exercise;Patient/family education    PT Goals (Current goals can be  found in the Care Plan section)  Acute Rehab PT Goals Patient Stated Goal: Regain IND PT Goal Formulation: With patient Time For Goal Achievement: 06/20/18 Potential to Achieve Goals: Good    Frequency Min 3X/week   Barriers to discharge        Co-evaluation               AM-PAC PT "6 Clicks" Mobility  Outcome Measure Help needed turning from your back to your side while in a flat bed without using bedrails?: A Little Help needed moving from lying on your back to sitting on the side of a flat bed without using bedrails?: A Little Help needed moving to and from a bed to a chair (including a wheelchair)?: A Little Help needed standing up from a chair using your arms (e.g., wheelchair or bedside chair)?: A Little Help needed to walk in hospital room?: A Little Help needed climbing 3-5 steps with a railing? : A Lot 6 Click Score: 17    End of Session Equipment Utilized During Treatment: Oxygen Activity Tolerance: Patient limited by fatigue Patient left: in chair;with call bell/phone within reach;with family/visitor present Nurse Communication: Mobility status PT Visit Diagnosis: Difficulty in walking, not elsewhere classified (R26.2)    Time: 4709-2957 PT Time Calculation (min) (ACUTE ONLY): 25 min   Charges:   PT Evaluation $PT Eval Low Complexity: 1 Low PT Treatments $Gait Training: 8-22 mins        [place] Pager [phone] Office [phone]   Danely Bayliss 06/06/2018, 3:05 PM

## 2018-06-06 NOTE — Consult Note (Addendum)
Ms. Keirra Garry is a 84y/o woman with PMH significant for HFpEF, A. Fib (not on anticoagulation due to long standing issues with anemia), CAD s/p PCI-LAD 05/2011 (not on aspirin due to anemia), and colon cancer s/p colostomy admitted with an incarcerated hernia. She underwent a fairly extensive surgery yesterday for this and tolerated the surgery well. This evening, she went into A. Fib with RVR ranging from heart rates in the low 100s to mid 130s. I spoke with her nurse at the bedside who stated that there have not been heart rates over the 130s. Ms. Dorantes remains hemodynamically stable with blood pressures in the 140s. She has been given metoprolol 5mg  IV x 2 doses and is being transferred back to step down. At this time, would recommend the following:  - Monitor on telemetry - Start diltiazem drip and titrate to heart rates <110bpm  - Per surgical team, not a candidate for heparin at this time.   As she is hemodynamically stable, she will be formally evaluated in person by the cardiology team tomorrow (3/8) for further recommendations.   1:32am: Called by nursing team to report a troponin of 10. Patient is chest pain free and resting comfortably. Asked the nurses to obtain a repeat ECG. Spoke with Dr. Donne Hazel from the surgery team regarding her troponin. Given the extent of her surgery, he does not feel that full anticoagulation or dual antiplatelet therapy is safe at this time. As such, there is no benefit to bringing her to the cath lab as we would be unable to intervene. Dr. Donne Hazel is comfortable starting aspirin which has been ordered. She will be transferred to the ICU at [facility] for further monitor.

## 2018-06-07 ENCOUNTER — Inpatient Hospital Stay (HOSPITAL_COMMUNITY): Payer: Medicare Other

## 2018-06-07 DIAGNOSIS — I25119 Atherosclerotic heart disease of native coronary artery with unspecified angina pectoris: Secondary | ICD-10-CM

## 2018-06-07 DIAGNOSIS — K440 Diaphragmatic hernia with obstruction, without gangrene: Principal | ICD-10-CM

## 2018-06-07 DIAGNOSIS — I4819 Other persistent atrial fibrillation: Secondary | ICD-10-CM

## 2018-06-07 DIAGNOSIS — I214 Non-ST elevation (NSTEMI) myocardial infarction: Secondary | ICD-10-CM

## 2018-06-07 DIAGNOSIS — I340 Nonrheumatic mitral (valve) insufficiency: Secondary | ICD-10-CM

## 2018-06-07 LAB — BASIC METABOLIC PANEL
Anion gap: 7 (ref 5–15)
BUN: 15 mg/dL (ref 8–23)
CO2: 22 mmol/L (ref 22–32)
Calcium: 7.6 mg/dL — ABNORMAL LOW (ref 8.9–10.3)
Chloride: 105 mmol/L (ref 98–111)
Creatinine, Ser: 1.17 mg/dL — ABNORMAL HIGH (ref 0.44–1.00)
GFR calc Af Amer: 51 mL/min — ABNORMAL LOW (ref >60)
GFR calc non Af Amer: 44 mL/min — ABNORMAL LOW (ref >60)
GLUCOSE: 118 mg/dL — AB (ref 70–99)
Potassium: 3.7 mmol/L (ref 3.5–5.1)
Sodium: 134 mmol/L — ABNORMAL LOW (ref 135–145)

## 2018-06-07 LAB — ECHOCARDIOGRAM COMPLETE
Height: 62.5 in
Weight: 2645.52 oz

## 2018-06-07 LAB — TROPONIN I: Troponin I: 10.58 ng/mL (ref <0.03)

## 2018-06-07 LAB — MRSA PCR SCREENING: MRSA by PCR: NEGATIVE

## 2018-06-07 MED ORDER — ROSUVASTATIN CALCIUM 5 MG PO TABS
10.0000 mg | ORAL_TABLET | Freq: Every day | ORAL | Status: DC
  Administered 2018-06-07 – 2018-06-08 (×2): 10 mg via ORAL
  Filled 2018-06-07 (×2): qty 2

## 2018-06-07 MED ORDER — DILTIAZEM HCL-DEXTROSE 100-5 MG/100ML-% IV SOLN (PREMIX)
5.0000 mg/h | INTRAVENOUS | Status: DC
  Administered 2018-06-07: 15 mg/h via INTRAVENOUS
  Filled 2018-06-07 (×2): qty 100

## 2018-06-07 MED ORDER — ASPIRIN EC 81 MG PO TBEC
81.0000 mg | DELAYED_RELEASE_TABLET | Freq: Every day | ORAL | Status: DC
  Administered 2018-06-07 – 2018-06-10 (×4): 81 mg via ORAL
  Filled 2018-06-07 (×4): qty 1

## 2018-06-07 MED ORDER — ASPIRIN 325 MG PO TABS: 325.0000 mg | ORAL_TABLET | Freq: Every day | ORAL | Status: DC

## 2018-06-07 MED ORDER — ASPIRIN 325 MG PO TABS
325.0000 mg | ORAL_TABLET | Freq: Once | ORAL | Status: AC
  Administered 2018-06-07: 325 mg via ORAL
  Filled 2018-06-07: qty 1

## 2018-06-07 NOTE — Consult Note (Signed)
Cardiology Consultation:   Patient ID: [name] MRN: [id number]; DOB: 10/08/1934  Admit date: 06/05/2018 Date of Consult: 06/07/2018  Primary Care Provider: Terald Sleeper, PA-C Primary Cardiologist: No primary care provider on file.  Primary Electrophysiologist:  None    Patient Profile:   [name] is a 84 y.o. female with a hx of HFpEF, A. Fib (not on anticoagulation due to long standing issues with anemia), CAD s/p PCI-LAD 05/2011 (not on aspirin due to anemia), and colon cancer s/p colostomy admitted with an incarcerated herniawho is being seen today for the evaluation of A. Fib with RVR and elevated troponin.  History of Present Illness:   [name] underwent extensive surgery on 3/6 but states that she has been doing quite well since. She has been out of bed walking around since surgery. She has not had any chest pain, shortness of breath or palpitations. She knows she has had A. Fib in the past but was unaware that she was in A. Fib now. She has not had any chest pain or shortness of breath with ambulation. She denies any lightheadedness, dizziness, palpitations, lower leg swelling, fevers or chills.   Past Medical History:  Diagnosis Date  . Anemia due to blood loss, chronic   . Anxiety state, unspecified   . Atrophy of left kidney   . Cancer ([place])    melanoma on back  . Cancer of right colon ([place]) 05/20/2017  . CKD (chronic kidney disease), stage III (HCC)      sees Dr. Lowanda Foster   . Colon cancer ([place]) 2019   stage 3  . Colostomy in place [facility])    in 2016  . Coronary artery disease cardiologist-  dr Bronson Ing   a. Moderate to severe coronary disease involving the left anterior descending artery and right coronary artery.  Sequential stenosis in the LAD is significant.  Right coronary artery is moderate to severely likely nonischemic. Questionable small LVOT obstruction.  No Brockenbrough maneuver was performed.  Catheterization January 2013 w/ PTCA and DES x1 to pLAD b.  05/2014 MV no isch/infarct, EF 60%.  . Depressive disorder, not elsewhere classified   . Diastolic CHF, chronic [facility])    cardiologist-  dr Bronson Ing   pt. denies at preop  . Diversion colitis 2016  . Epidural abscess L5- S1 03/01/2016  . Gastro-esophageal reflux disease without esophagitis 07/16/2007   Overview:  Overview:  Qualifier: Diagnosis of  By: Nelson-Smith CMA (AAMA), Dottie  Overview:  Overview:  Overview:  Qualifier: Diagnosis of  By: Nelson-Smith CMA (AAMA), Dottie   . GERD (gastroesophageal reflux disease)   . H/O hiatal hernia   . History of [name]'s esophagus   . History of melanoma excision    back  . Hypertension   . Hypothyroidism   . Infection of spine ([place])    in hospial  . Macular degeneration    both eyes  . PAF (paroxysmal atrial fibrillation) ([place])   . Parastomal hernia   . Pneumonia   . PONV (postoperative nausea and vomiting)   . Pulmonary hypertension ([place])    PA systolic [id number] mmHg by echocardiogram 06-17-2015  PA pressure 05-08-2011 by cardiac catheterization -- no sig. pulm. htn)PA saturation 62% thermodilution cardiac index 2.0 thick cardiac index 2.4  . Pure hypercholesterolemia   . S/P drug eluting coronary stent placement 05/08/2011   x1 DES to proximal LAD  . Symptomatic cholelithiasis 07/18/2016  . Tubulovillous adenoma of colon   . Wears dentures  lower  . Wears glasses     Past Surgical History:  Procedure Laterality Date  . CARDIAC CATHETERIZATION Left 03/05/2015   Procedure: CENTRAL LINE INSERTION;  Surgeon: Aviva Signs, MD;  Location: [facility];  Service: General;  Laterality: Left;  . CATARACT EXTRACTION W/ INTRAOCULAR LENS  IMPLANT, BILATERAL  ~ 2002  . CHOLECYSTECTOMY N/A 07/18/2016   Procedure: LAPAROSCOPIC CHOLECYSTECTOMY WITH INTRAOPERATIVE CHOLANGIOGRAM;  Surgeon: [name], MD;  Location: [facility];  Service: General;  Laterality: N/A;  . COLECTOMY WITH COLOSTOMY CREATION/HARTMANN PROCEDURE N/A 03/05/2015   Procedure:  PARTIAL COLECTOMY WITH COLOSTOMY CREATION/HARTMANN PROCEDURE;  Surgeon: Aviva Signs, MD;  Location: [facility];  Service: General;  Laterality: N/A;  . COLON SURGERY     colostomy bag  LLQ  . ESOPHAGEAL MANOMETRY N/A 06/06/2014   Procedure: ESOPHAGEAL MANOMETRY (EM);  Surgeon: Jerene Bears, MD;  Location: [facility];  Service: Gastroenterology;  Laterality: N/A;  . ESOPHAGOGASTRODUODENOSCOPY (EGD) WITH PROPOFOL N/A 08/04/2015   Procedure: ESOPHAGOGASTRODUODENOSCOPY (EGD) WITH PROPOFOL;  Surgeon: Mauri Pole, MD;  Location: [facility];  Service: Endoscopy;  Laterality: N/A;  . HERNIA REPAIR  2019   left side/ removed mass  . IR GENERIC HISTORICAL  03/04/2016   IR LUMBAR DISC ASPIRATION W/IMG GUIDE 03/04/2016 Luanne Bras, MD [facility]-INTERV RAD  . LAPAROSCOPIC NISSEN FUNDOPLICATION N/A 3/54/6568   Procedure: LAPAROSCOPIC  LYSIS OF ADHESIONS, SEGMENTAL GASTRECTOMY, PARTIAL REDUCTION OF HERNIA;  Surgeon: [name], MD;  Location: [facility];  Service: General;  Laterality: N/A;  . LAPAROSCOPIC PARTIAL COLECTOMY N/A 05/07/2017   Procedure: LAPAROSCOPIC  CONVERTED TO OPEN RIGHT COLECTOMY, PARASTOMAL HERNIA  REPAIR;  Surgeon: Leighton Ruff, MD;  Location: [facility];  Service: General;  Laterality: N/A;  . NISSEN FUNDOPLICATION  1275  . PERCUTANEOUS CORONARY STENT INTERVENTION (PCI-S) N/A 05/08/2011   Procedure: PERCUTANEOUS CORONARY STENT INTERVENTION (PCI-S);  Surgeon: Wellington Hampshire, MD;  Location: [facility] CATH LAB;  Service: Cardiovascular;  Laterality: N/A;  . ROTATOR CUFF REPAIR  ~ 2001   right  . SHOULDER ARTHROSCOPY  ~ 2004; 2005   left; "joint's wore out"  . TONSILLECTOMY  ~ 1944  . TUBAL LIGATION  1972     Home Medications:  Prior to Admission medications   Medication Sig Start Date End Date Taking? Authorizing Provider  albuterol (PROVENTIL HFA;VENTOLIN HFA) 108 (90 Base) MCG/ACT inhaler Inhale 2 puffs into the lungs every 6 (six) hours as needed for wheezing or shortness of breath.  04/15/18  Yes Terald Sleeper, PA-C  ALPRAZolam (XANAX) 0.25 MG tablet TAKEK 1/2 TO 1 TABLET 3 TIMES DAILY AS NEEDED Patient taking differently: Take 0.125-0.25 mg by mouth 3 (three) times daily as needed for anxiety.  01/30/18  Yes Terald Sleeper, PA-C  amLODipine (NORVASC) 5 MG tablet TAKE ONE (1) TABLET EACH DAY Patient taking differently: Take 5 mg by mouth daily.  01/30/18  Yes Terald Sleeper, PA-C  citalopram (CELEXA) 40 MG tablet TAKE ONE (1) TABLET EACH DAY Patient taking differently: Take 40 mg by mouth daily.  01/30/18  Yes Terald Sleeper, PA-C  denosumab (PROLIA) 60 MG/ML SOSY injection Inject 60 mg into the skin every 6 (six) months. 02/18/18  Yes Terald Sleeper, PA-C  esomeprazole (NEXIUM) 40 MG capsule TAKE ONE (1) [place] Patient taking differently: Take 40 mg by mouth daily.  01/30/18  Yes Terald Sleeper, PA-C  ferrous sulfate 325 (65 FE) MG EC tablet Take 325 mg by mouth daily with breakfast.  Yes [provider]  fluticasone (FLONASE) 50 MCG/ACT nasal spray USE 2 SPRAYS IN EACH NOSTRIL DAILY Patient taking differently: Place 2 sprays into both nostrils daily.  10/15/17  Yes Terald Sleeper, PA-C  furosemide (LASIX) 40 MG tablet Take 40 mg by mouth as needed for fluid.    Yes [provider]  hydrALAZINE (APRESOLINE) 50 MG tablet TAKE ONE (1) TABLET THREE (3) TIMES EACH DAY Patient taking differently: Take 50 mg by mouth 3 (three) times daily.  01/30/18  Yes Terald Sleeper, PA-C  isosorbide mononitrate (IMDUR) 30 MG 24 hr tablet Take 1 tablet (30 mg total) by mouth every morning. 01/30/18  Yes Terald Sleeper, PA-C  levothyroxine (SYNTHROID, LEVOTHROID) 50 MCG tablet TAKE ONE TABLET EACH MORNING BEFORE BREAKFAST Patient taking differently: Take 50 mcg by mouth daily before breakfast.  01/30/18  Yes Terald Sleeper, PA-C  loratadine (CLARITIN) 10 MG tablet Take 1 tablet (10 mg total) by mouth at bedtime. 01/30/18  Yes Terald Sleeper, PA-C  losartan (COZAAR) 100 MG  tablet TAKE ONE (1) TABLET EACH DAY Patient taking differently: Take 50 mg by mouth daily.  01/30/18  Yes Terald Sleeper, PA-C  metoprolol succinate (TOPROL-XL) 25 MG 24 hr tablet Take 0.5 tablets (12.5 mg total) by mouth daily. 05/05/18  Yes Terald Sleeper, PA-C  montelukast (SINGULAIR) 10 MG tablet Take 1 tablet (10 mg total) by mouth at bedtime. 01/30/18  Yes Terald Sleeper, PA-C  ondansetron (ZOFRAN) 8 MG tablet Take 1 tablet (8 mg total) by mouth every 8 (eight) hours as needed for nausea or vomiting. 06/12/17  Yes Alla Feeling, NP  polyethylene glycol powder (GAVILAX) powder MIX 17 GRAMS IN 80Z OF WATER/JUICE AND DRINK TWICE DAILY Patient taking differently: Take 17 g by mouth daily.  10/15/17  Yes Terald Sleeper, PA-C  potassium chloride (K-DUR) 10 MEQ tablet TAKE ONE (1) TABLET EACH DAY Patient taking differently: Take 10 mEq by mouth daily as needed (when taking furosemide).  10/15/17  Yes Terald Sleeper, PA-C  Propylene Glycol (SYSTANE BALANCE OP) Apply 1 drop to eye daily as needed (dry eyes).   Yes [provider]  rosuvastatin (CRESTOR) 10 MG tablet TAKE ONE (1) TABLET EACH DAY Patient taking differently: Take 10 mg by mouth daily.  01/30/18  Yes Terald Sleeper, PA-C  traZODone (DESYREL) 100 MG tablet Take 1 tablet (100 mg total) by mouth at bedtime. 01/30/18  Yes Terald Sleeper, PA-C  Vitamin D, Ergocalciferol, (DRISDOL) 50000 units CAPS capsule TAKE 1 CAPSULE ON TUESDAYS Patient taking differently: Take 50,000 Units by mouth every Tuesday.  09/11/17  Yes Terald Sleeper, PA-C  zolpidem (AMBIEN) 10 MG tablet Take 1 tablet (10 mg total) by mouth at bedtime. Patient taking differently: Take 10 mg by mouth at bedtime as needed for sleep.  01/30/18  Yes Terald Sleeper, PA-C  Meclizine HCl 25 MG CHEW TAKE 1 TABLET 3 TIMES DAILY AS NEEDED FOR DIZZINESS Patient taking differently: Chew 25 mg by mouth 3 (three) times daily as needed (dizziness).  10/15/17   Terald Sleeper, PA-C  nitroGLYCERIN  (NITROSTAT) 0.4 MG SL tablet Place 1 tablet (0.4 mg total) under the tongue every 5 (five) minutes as needed for chest pain. Reported on 04/26/2015 01/13/17   Herminio Commons, MD  nystatin (MYCOSTATIN/NYSTOP) powder Apply topically 4 (four) times daily. Patient taking differently: Apply 1 Bottle topically as needed (yeast).  05/05/18   Terald Sleeper, PA-C  traMADol (ULTRAM) 50 MG tablet Take 1 tablet (50 mg total) by mouth every 12 (twelve) hours as needed. Patient taking differently: Take 50 mg by mouth daily as needed for moderate pain.  06/12/17   Alla Feeling, NP    Inpatient Medications: Scheduled Meds: . acetaminophen  1,000 mg Oral Q8H  . aspirin EC  81 mg Oral Daily  . enoxaparin (LOVENOX) injection  40 mg Subcutaneous Q24H  . fluticasone  2 spray Each Nare Daily  . gabapentin  300 mg Oral BID  . isosorbide mononitrate  30 mg Oral q morning - 10a  . levothyroxine  50 mcg Oral QAC breakfast  . lip balm  1 application Topical BID  . loratadine  10 mg Oral QHS  . mouth rinse  15 mL Mouth Rinse BID  . metoprolol succinate  12.5 mg Oral Daily  . montelukast  10 mg Oral QHS   Continuous Infusions: . albumin human    . diltiazem (CARDIZEM) infusion 10 mg/hr (06/07/18 0300)  . lactated ringers 1,000 mL (06/05/18 1445)   PRN Meds: albumin human, albuterol, ALPRAZolam, alum & mag hydroxide-simeth, bisacodyl, diphenhydrAMINE **OR** diphenhydrAMINE, enalaprilat, guaiFENesin-dextromethorphan, hydrALAZINE, hydrocortisone, hydrocortisone cream, HYDROmorphone (DILAUDID) injection, lactated ringers, magic mouthwash, meclizine, menthol-cetylpyridinium, methocarbamol, metoprolol tartrate, nitroGLYCERIN, nystatin, ondansetron **OR** ondansetron (ZOFRAN) IV, phenol, polyethylene glycol, polyvinyl alcohol, prochlorperazine **OR** prochlorperazine, simethicone, zolpidem  Allergies:    Allergies  Allergen Reactions  . Sulfamethoxazole Rash    Social History:   Social History    Socioeconomic History  . Marital status: Divorced    Spouse name: Not on file  . Number of children: 3  . Years of education: Not on file  . Highest education level: Not on file  Occupational History  . Occupation: Retired  Scientific laboratory technician  . Financial resource strain: Not on file  . Food insecurity:    Worry: Not on file    Inability: Not on file  . Transportation needs:    Medical: Not on file    Non-medical: Not on file  Tobacco Use  . Smoking status: Former Smoker    Packs/day: 1.00    Years: 20.00    Pack years: 20.00    Types: Cigarettes    Start date: 10/12/1958    Last attempt to quit: 04/26/1988    Years since quitting: 30.1  . Smokeless tobacco: Never Used  Substance and Sexual Activity  . Alcohol use: No    Alcohol/week: 0.0 standard drinks  . Drug use: No  . Sexual activity: Not Currently  Lifestyle  . Physical activity:    Days per week: Not on file    Minutes per session: Not on file  . Stress: Not on file  Relationships  . Social connections:    Talks on phone: Not on file    Gets together: Not on file    Attends religious service: Not on file    Active member of club or organization: Not on file    Attends meetings of clubs or organizations: Not on file    Relationship status: Not on file  . Intimate partner violence:    Fear of current or ex partner: Not on file    Emotionally abused: Not on file    Physically abused: Not on file    Forced sexual activity: Not on file  Other Topics Concern  . Not on file  Social History Narrative   Divorced.  Lives alone.  Ambulates with a walker.     Family History:  Family History  Problem Relation Age of Onset  . Heart attack Father   . Hypertension Father   . Hypertension Mother   . Skin cancer Brother        melanoma  . Atrial fibrillation Sister   . Skin cancer Sister   . Colon cancer Neg Hx   . Esophageal cancer Neg Hx   . Rectal cancer Neg Hx   . Stomach cancer Neg Hx      ROS:  Please  see the history of present illness.   All other ROS reviewed and negative.     Physical Exam/Data:   Vitals:   06/06/18 2145 06/06/18 2255 06/06/18 2341 06/07/18 0045  BP: (!) 147/99 140/70 (!) 149/90   Pulse: (!) 117 (!) 117 (!) 117   Resp: 15 16    Temp: 98.9 F (37.2 C) 97.8 F (36.6 C) 97.9 F (36.6 C) 98.4 F (36.9 C)  TempSrc: Oral Oral Oral Oral  SpO2: 97% 97% 95%   Weight:    75 kg  Height:    5' 2.5" (1.588 m)    Intake/Output Summary (Last 24 hours) at 06/07/2018 0420 Last data filed at 06/07/2018 0300 Gross per 24 hour  Intake 1457.11 ml  Output 2800 ml  Net -1342.89 ml   Last 3 Weights 06/07/2018 06/05/2018 06/05/2018  Weight (lbs) 165 lb 5.5 oz 158 lb 158 lb  Weight (kg) 75 kg 71.668 kg 71.668 kg     Body mass index is 29.76 kg/m.  General:  Well nourished, well developed, in no acute distress HEENT: normal, dry mucous membranes  Neck: no JVD Cardiac:  Irregularly irregular rhythm with rates around 100bpm. No murmurs, rubs or gallops  Lungs:  clear to auscultation bilaterally, no wheezing, rhonchi or rales  Abd: Well-healed surgical incision over midnight with ostomy in LLQ. Soft, nontender to light palpation. Drain in place.  Ext: no edema Skin: warm and dry  Neuro:  CNs 2-12 intact, no focal abnormalities noted Psych:  Normal affect   EKG:  The EKG was personally reviewed and demonstrates:  Atrial fibrillation with RVR and nonspecific ST segment changes Telemetry:  Telemetry was personally reviewed and demonstrates:  A. Fib with RVR with rates mostly ~100bpm  Relevant CV Studies: Echo February 2019 - Left ventricle: The cavity size was normal. Wall thickness was   normal. Systolic function was normal. The estimated ejection   fraction was in the range of 55% to 60%. Wall motion was normal;   there were no regional wall motion abnormalities. Features are   consistent with a pseudonormal left ventricular filling pattern,   with concomitant abnormal  relaxation and increased filling   pressure (grade 2 diastolic dysfunction). Doppler parameters are   consistent with high ventricular filling pressure. - Aortic valve: Valve area (VTI): 1.64 cm^2. Valve area (Vmax):   1.55 cm^2. Valve area (Vmean): 1.65 cm^2. - Mitral valve: Moderately calcified annulus. Normal thickness   leaflets . There was mild regurgitation. - Left atrium: The atrium was severely dilated. - Atrial septum: No defect or patent foramen ovale was identified. - Pulmonary arteries: Systolic pressure was mildly increased. PA   peak pressure: 37 mm Hg (S).  Laboratory Data:  Chemistry Recent Labs  Lab 06/01/18 0939 06/06/18 2339  NA 139 134*  K 4.4 3.7  CL 103 105  CO2 30 22  GLUCOSE 113* 118*  BUN 22 15  CREATININE 1.65* 1.17*  CALCIUM 9.6 7.6*  GFRNONAA 29* 44*  GFRAA 33* 51*  ANIONGAP 6 7    Recent Labs  Lab 06/01/18 0939  PROT 6.9  ALBUMIN 4.1  AST 20  ALT 14  ALKPHOS 58  BILITOT 0.5   Hematology Recent Labs  Lab 06/01/18 0939  WBC 5.3  RBC 4.79  HGB 12.7  HCT 42.6  MCV 88.9  MCH 26.5  MCHC 29.8*  RDW 15.7*  PLT 192   Cardiac Enzymes Recent Labs  Lab 06/06/18 2339  TROPONINI 10.58*   No results for input(s): TROPIPOC in the last 168 hours.  BNPNo results for input(s): BNP, PROBNP in the last 168 hours.  DDimer No results for input(s): DDIMER in the last 168 hours.  Radiology/Studies:  Dg Chest Port 1 View  Result Date: 06/07/2018 CLINICAL DATA:  Tachycardia. EXAM: PORTABLE CHEST 1 VIEW COMPARISON:  Chest CT 06/04/2016 FINDINGS: Post recent repair of hiatal hernia with surgical drain in place. The heart is enlarged. Elevation of left hemidiaphragm. Mild left greater than right basilar atelectasis. No confluent airspace disease. No pulmonary edema, large pleural effusion or pneumothorax. IMPRESSION: Cardiomegaly with left greater than right basilar atelectasis. Electronically Signed   By: Keith Rake M.D.   On: 06/07/2018 00:00    Dg Esophagus W Single Cm (sol Or Thin Ba)  Result Date: 06/06/2018 CLINICAL DATA:  84 year old female with hiatal hernia repair and fundoplication. EXAM: ESOPHOGRAM/BARIUM SWALLOW TECHNIQUE: Single contrast examination was performed using water-soluble contrast. FLUOROSCOPY TIME:  Fluoroscopy Time:  1 minutes and 24 seconds Radiation Exposure Index (if provided by the fluoroscopic device): 47.2 mGy COMPARISON:  01/12/2018 FINDINGS: Patient drank water soluble contrast in a semi upright position. The esophagus is diffusely dilated. Contrast pools in the distal esophagus. Luminal narrowing at the GE junction, presumably at the fundoplication region. Contrast drains into the stomach. There is no evidence for extravasation or leak. Contrast travels through the stomach into the duodenum. The duodenum is mildly distended. Intermittent fluoroscopy was obtained with the patient in an upright position to evaluate the retained contrast in the distal esophagus. There was pooling contrast in the distal esophagus approximately 5 minutes after drinking. Patient was asymptomatic throughout the procedure. IMPRESSION: 1. No evidence for a postoperative leak. 2. Diffusely dilated esophagus. Luminal narrowing presumably related to the fundoplication and postoperative edema. Contrast drains into the stomach and duodenum. 3. Retained contrast in the distal esophagus at the end of the procedure. 4. These results were called by telephone at the time of interpretation on 06/06/2018 at 10:58 am to Dr. Rolm Bookbinder, who verbally acknowledged these results. Electronically Signed   By: Markus Daft M.D.   On: 06/06/2018 11:01    Assessment and Plan:   [name] is a 84 y.o. female with a hx of HFpEF, A. Fib (not on anticoagulation due to long standing issues with anemia), CAD s/p PCI-LAD 05/2011 (not on aspirin due to anemia), and colon cancer s/p colostomy admitted with an incarcerated herniawho is being seen today for the  evaluation of A. Fib with RVR and elevated troponin. Fortunately, she clinically appears well and is asymptomatic from her A. Fib. Her rates are pretty well controlled on a diltiazem drip at this time. As for her NSTEMI, she is not having any cardiac symptoms at this time. It is unclear if this is all demand from her surgery or if this is related to her A. Fib. She is not yet cleared for full anticoagulation or dual antiplatelet therapy per her surgical team.  - S/p aspirin 325mg   - Continue aspirin  81mg  daily - No other antiplatelet agents or anticoagulation at this time per surgical team - Monitor on tele - Continue diltiazem drip - Trend troponins  - Echo ordered       For questions or updates, please contact [place] Please consult [email or web address] for contact info under     Signed, Princella Pellegrini, MD  06/07/2018 4:20 AM

## 2018-06-07 NOTE — Progress Notes (Signed)
CRITICAL VALUE ALERT  Critical Value: Troponin 10.58  Date & Time Notied:  06/07/2018 1015  Provider Notified: Alric Quan  Orders Received/Actions taken: Obtaining EKG, awaiting other orders  Mariann Laster RN

## 2018-06-07 NOTE — Progress Notes (Signed)
Patient ID: [name], female   DOB: 08/17/1934, 84 y.o.   MRN: [id number] Troponin elevated with afib and rvr, discussed with cardiology, will give 325 asa and tx to [facility] for evaluation and montioring

## 2018-06-07 NOTE — Progress Notes (Signed)
Progress Note  Patient Name: [name] Date of Encounter: 06/07/2018  Primary Cardiologist: Dr. Kate Sable  Subjective   No chest pain or palpitations this morning, appears comfortable.  No shortness of breath at rest.  Inpatient Medications    Scheduled Meds: . acetaminophen  1,000 mg Oral Q8H  . aspirin EC  81 mg Oral Daily  . enoxaparin (LOVENOX) injection  40 mg Subcutaneous Q24H  . fluticasone  2 spray Each Nare Daily  . gabapentin  300 mg Oral BID  . isosorbide mononitrate  30 mg Oral q morning - 10a  . levothyroxine  50 mcg Oral QAC breakfast  . lip balm  1 application Topical BID  . loratadine  10 mg Oral QHS  . mouth rinse  15 mL Mouth Rinse BID  . metoprolol succinate  12.5 mg Oral Daily  . montelukast  10 mg Oral QHS   Continuous Infusions: . albumin human    . diltiazem (CARDIZEM) infusion 15 mg/hr (06/07/18 0647)   PRN Meds: albumin human, albuterol, ALPRAZolam, alum & mag hydroxide-simeth, bisacodyl, diphenhydrAMINE **OR** diphenhydrAMINE, enalaprilat, guaiFENesin-dextromethorphan, hydrALAZINE, hydrocortisone, hydrocortisone cream, HYDROmorphone (DILAUDID) injection, magic mouthwash, meclizine, menthol-cetylpyridinium, methocarbamol, metoprolol tartrate, nitroGLYCERIN, nystatin, ondansetron **OR** ondansetron (ZOFRAN) IV, phenol, polyethylene glycol, polyvinyl alcohol, prochlorperazine **OR** prochlorperazine, simethicone, zolpidem   Vital Signs    Vitals:   06/07/18 0530 06/07/18 0600 06/07/18 0630 06/07/18 0737  BP: (!) 86/73 129/68 129/76   Pulse: 82 66 85   Resp: (!) 23 20 (!) 22   Temp:    98.7 F (37.1 C)  TempSrc:    Oral  SpO2: 96% 98% 95%   Weight:      Height:        Intake/Output Summary (Last 24 hours) at 06/07/2018 0823 Last data filed at 06/07/2018 0647 Gross per 24 hour  Intake 1057.81 ml  Output 1890 ml  Net -832.19 ml   Filed Weights   06/05/18 0554 06/05/18 0612 06/07/18 0045  Weight: 71.7 kg 71.7 kg 75 kg    Telemetry     Atrial fibrillation.  Personally reviewed.  ECG    Tracing from 06/07/2018 shows atrial fibrillation with nonspecific ST-T changes and aberrantly conducted complexes.  Personally reviewed.  Physical Exam   GEN:  Elderly woman, no acute distress.   Neck: No JVD. Cardiac:  Irregularly irregular, no gallop.  Respiratory: Nonlabored. Clear to auscultation bilaterally. GI:  Well-healed surgical incision, ostomy left lower quadrant. MS: No edema; No deformity. Neuro:  Nonfocal. Psych: Alert and oriented x 3. Normal affect.  Labs    Chemistry Recent Labs  Lab 06/01/18 0939 06/06/18 2339  NA 139 134*  K 4.4 3.7  CL 103 105  CO2 30 22  GLUCOSE 113* 118*  BUN 22 15  CREATININE 1.65* 1.17*  CALCIUM 9.6 7.6*  PROT 6.9  --   ALBUMIN 4.1  --   AST 20  --   ALT 14  --   ALKPHOS 58  --   BILITOT 0.5  --   GFRNONAA 29* 44*  GFRAA 33* 51*  ANIONGAP 6 7     Hematology Recent Labs  Lab 06/01/18 0939  WBC 5.3  RBC 4.79  HGB 12.7  HCT 42.6  MCV 88.9  MCH 26.5  MCHC 29.8*  RDW 15.7*  PLT 192    Cardiac Enzymes Recent Labs  Lab 06/06/18 2339  TROPONINI 10.58*   No results for input(s): TROPIPOC in the last 168 hours.    Radiology  Dg Chest Port 1 View  Result Date: 06/07/2018 CLINICAL DATA:  Tachycardia. EXAM: PORTABLE CHEST 1 VIEW COMPARISON:  Chest CT 06/04/2016 FINDINGS: Post recent repair of hiatal hernia with surgical drain in place. The heart is enlarged. Elevation of left hemidiaphragm. Mild left greater than right basilar atelectasis. No confluent airspace disease. No pulmonary edema, large pleural effusion or pneumothorax. IMPRESSION: Cardiomegaly with left greater than right basilar atelectasis. Electronically Signed   By: Keith Rake M.D.   On: 06/07/2018 00:00   Dg Esophagus W Single Cm (sol Or Thin Ba)  Result Date: 06/06/2018 CLINICAL DATA:  85 year old female with hiatal hernia repair and fundoplication. EXAM: ESOPHOGRAM/BARIUM SWALLOW  TECHNIQUE: Single contrast examination was performed using water-soluble contrast. FLUOROSCOPY TIME:  Fluoroscopy Time:  1 minutes and 24 seconds Radiation Exposure Index (if provided by the fluoroscopic device): 47.2 mGy COMPARISON:  01/12/2018 FINDINGS: Patient drank water soluble contrast in a semi upright position. The esophagus is diffusely dilated. Contrast pools in the distal esophagus. Luminal narrowing at the GE junction, presumably at the fundoplication region. Contrast drains into the stomach. There is no evidence for extravasation or leak. Contrast travels through the stomach into the duodenum. The duodenum is mildly distended. Intermittent fluoroscopy was obtained with the patient in an upright position to evaluate the retained contrast in the distal esophagus. There was pooling contrast in the distal esophagus approximately 5 minutes after drinking. Patient was asymptomatic throughout the procedure. IMPRESSION: 1. No evidence for a postoperative leak. 2. Diffusely dilated esophagus. Luminal narrowing presumably related to the fundoplication and postoperative edema. Contrast drains into the stomach and duodenum. 3. Retained contrast in the distal esophagus at the end of the procedure. 4. These results were called by telephone at the time of interpretation on 06/06/2018 at 10:58 am to Dr. Rolm Bookbinder, who verbally acknowledged these results. Electronically Signed   By: Markus Daft M.D.   On: 06/06/2018 11:01    Cardiac Studies   Echocardiogram 05/28/2017: Study Conclusions  - Left ventricle: The cavity size was normal. Wall thickness was   normal. Systolic function was normal. The estimated ejection   fraction was in the range of 55% to 60%. Wall motion was normal;   there were no regional wall motion abnormalities. Features are   consistent with a pseudonormal left ventricular filling pattern,   with concomitant abnormal relaxation and increased filling   pressure (grade 2 diastolic  dysfunction). Doppler parameters are   consistent with high ventricular filling pressure. - Aortic valve: Valve area (VTI): 1.64 cm^2. Valve area (Vmax):   1.55 cm^2. Valve area (Vmean): 1.65 cm^2. - Mitral valve: Moderately calcified annulus. Normal thickness   leaflets . There was mild regurgitation. - Left atrium: The atrium was severely dilated. - Atrial septum: No defect or patent foramen ovale was identified. - Pulmonary arteries: Systolic pressure was mildly increased. PA   peak pressure: 37 mm Hg (S). - Technically adequate study.  Patient Profile     84 y.o. female with a history of chronic diastolic heart failure, pulmonary hypertension, paroxysmal atrial fibrillation, CKD stage III, and CAD status post DES to the proximal LAD in 2013.  Now being evaluated by cardiology with postoperative atrial fibrillation and enzymatic evidence of NSTEMI following robotic repair of paraesophageal hernia on March 6.  Assessment & Plan    1.  Persistent atrial fibrillation in the postoperative setting, heart rates better controlled on intravenous diltiazem.  She does not report any palpitations at this time.  She has not  been anticoagulated as an outpatient with history of PAF in the setting of chronic anemia and high perceived bleeding risk (I reviewed Dr. Court Joy last office note from March 2019).  2.  NSTEMI, troponin I 10.58.  Patient reports no active chest pain at this time and is otherwise hemodynamically stable.  3.  Known CAD status post DES to the proximal LAD in 2013.  Last LVEF 55 to 60% in February 2019.  4.  Status post robotic paraesophageal hernia repair on March 6 by Dr. Johney Maine.  I reviewed the overnight cardiology consultation note and reevaluated the patient today in the ICU.  She reports no angina and is hemodynamically stable with better heart rate control on IV diltiazem.  Echocardiogram will be obtained to follow-up LVEF, and she will need further cardiac markers to  better evaluate trend.  Cardiac catheterization will be considered for reevaluation of coronary anatomy when safe from a postoperative perspective.  It will be important to know when she could proceed with this and be considered able to take a dual antiplatelet regimen in [facility] PCI is needed.  Holding off on full anticoagulation at this time in the postoperative setting.  Continue aspirin, Imdur, and Toprol-XL.  Resume Crestor 10 mg daily.  We will continue to follow.  30 minutes of critical care time was utilized including review of the chart and hospital course, previous outpatient records, evaluation and examination of the patient with review of current medications and discussion with surgical team.  Signed, Rozann Lesches, MD  06/07/2018, 8:23 AM

## 2018-06-07 NOTE — Progress Notes (Addendum)
[facility]/Trauma Progress Note  2 Days Post-Op   Assessment/Plan Principal Problem:   Incarcerated recurrent hiatal hernia s/p robotic repair 06/05/2018 Active Problems:   CKD (chronic kidney disease) stage 3, GFR 30-59 ml/min (HCC)   Anemia due to chronic blood loss   Essential hypertension   Gastric volvulus   Irritable bowel syndrome with constipation   GERD (gastroesophageal reflux disease) s/p Toupet fundoplication (partial posterior wrap)   S/P robotic partial posterior (Toupet) fundoplication 0/05/5463   Pulmonary hypertension (HCC)   Hypothyroidism   History of [name]'s esophagus   Anxiety state  Recurrent hiatal hernia - S/P robotic repair of paraesophageal hiatal hernia with mesh, toupet fundoplication, LOA, Dr. Johney Maine, 03/06 - drain with high SS output  - UGI 03/07 showed Diffusely dilated esophagus. Luminal narrowing presumably related to the fundoplication and postoperative edema. Contrast drains into the stomach and duodenumno postoperative leak, retained contrast in distal esophagus.  - Slow emptying so will leave on clears again for today.    Elevated Troponin & a fib - pt moved to [facility] for cardiac evaluation, they plan for cath sometime this week which will be okay from a surgical standpoint, ECHO pending, okay for antiplatelet therapy from our standpoint as well.  - appreciate cardiology assistance  FEN: CLD VTE: SCD's, lovenox ID: pre-op only Foley: none Follow up: Dr. Johney Maine     LOS: 2 days    Subjective: CC: no complaints  Pt is not having nausea or vomiting. She states her ostomy bag was leaking stool yesterday so it was changed. She states gas and stool in her bag yesterday. Abdominal soreness but not much pain.   Objective: Vital signs in last 24 hours: Temp:  [97.7 F (36.5 C)-98.9 F (37.2 C)] 98.7 F (37.1 C) (03/08 0737) Pulse Rate:  [66-128] 85 (03/08 0630) Resp:  [15-24] 22 (03/08 0630) BP: (86-167)/(44-99) 129/76 (03/08  0630) SpO2:  [94 %-98 %] 95 % (03/08 0630) Weight:  [75 kg] 75 kg (03/08 0045) Last BM Date: 06/06/18  Intake/Output from previous day: 03/07 0701 - 03/08 0700 In: 1057.8 [P.O.:300; I.V.:757.8] Out: 2240 [Urine:1250; Drains:890; Stool:100] Intake/Output this shift: No intake/output data recorded.  PE: Gen:  Alert, NAD, pleasant, cooperative Card:  Irregularly irregular rhythm, normal rate Pulm:  CTA, no W/R/R, rate and effort normal Abd: Soft, ND, +BS, incisions C/D/I, drain with minimal serosanguinous drainage, ostomy in place with no stool or gas in bag. Mild generalized TTP without guarding, no peritonitis  Skin: no rashes noted, warm and dry   Anti-infectives: Anti-infectives (From admission, onward)   Start     Dose/Rate Route Frequency Ordered Stop   06/05/18 1700  metroNIDAZOLE (FLAGYL) IVPB 500 mg     500 mg 100 mL/hr over 60 Minutes Intravenous Every 8 hours 06/05/18 1621 06/05/18 1813   06/05/18 0600  cefTRIAXone (ROCEPHIN) 2 g in sodium chloride 0.9 % 100 mL IVPB     2 g 200 mL/hr over 30 Minutes Intravenous On call to O.R. 06/05/18 0541 06/05/18 1626   06/05/18 0600  metroNIDAZOLE (FLAGYL) IVPB 500 mg     500 mg 100 mL/hr over 60 Minutes Intravenous On call to O.R. 06/05/18 0541 06/05/18 0849      Lab Results:  No results for input(s): WBC, HGB, HCT, PLT in the last 72 hours. BMET Recent Labs    06/06/18 2339  NA 134*  K 3.7  CL 105  CO2 22  GLUCOSE 118*  BUN 15  CREATININE 1.17*  CALCIUM 7.6*  PT/INR No results for input(s): LABPROT, INR in the last 72 hours. CMP     Component Value Date/Time   NA 134 (L) 06/06/2018 2339   NA 141 01/30/2018 0913   K 3.7 06/06/2018 2339   CL 105 06/06/2018 2339   CO2 22 06/06/2018 2339   GLUCOSE 118 (H) 06/06/2018 2339   BUN 15 06/06/2018 2339   BUN 15 01/30/2018 0913   CREATININE 1.17 (H) 06/06/2018 2339   CREATININE 1.41 (H) 05/04/2018 0749   CALCIUM 7.6 (L) 06/06/2018 2339   PROT 6.9 06/01/2018 0939    PROT 6.4 01/30/2018 0913   ALBUMIN 4.1 06/01/2018 0939   ALBUMIN 4.0 01/30/2018 0913   AST 20 06/01/2018 0939   AST 11 (L) 05/04/2018 0749   ALT 14 06/01/2018 0939   ALT 9 05/04/2018 0749   ALKPHOS 58 06/01/2018 0939   BILITOT 0.5 06/01/2018 0939   BILITOT 0.3 05/04/2018 0749   GFRNONAA 44 (L) 06/06/2018 2339   GFRNONAA 35 (L) 05/04/2018 0749   GFRAA 51 (L) 06/06/2018 2339   GFRAA 40 (L) 05/04/2018 0749   Lipase     Component Value Date/Time   LIPASE <10 (L) 05/31/2016 1511    Studies/Results: Dg Chest Port 1 View  Result Date: 06/07/2018 CLINICAL DATA:  Tachycardia. EXAM: PORTABLE CHEST 1 VIEW COMPARISON:  Chest CT 06/04/2016 FINDINGS: Post recent repair of hiatal hernia with surgical drain in place. The heart is enlarged. Elevation of left hemidiaphragm. Mild left greater than right basilar atelectasis. No confluent airspace disease. No pulmonary edema, large pleural effusion or pneumothorax. IMPRESSION: Cardiomegaly with left greater than right basilar atelectasis. Electronically Signed   By: Keith Rake M.D.   On: 06/07/2018 00:00   Dg Esophagus W Single Cm (sol Or Thin Ba)  Result Date: 06/06/2018 CLINICAL DATA:  84 year old female with hiatal hernia repair and fundoplication. EXAM: ESOPHOGRAM/BARIUM SWALLOW TECHNIQUE: Single contrast examination was performed using water-soluble contrast. FLUOROSCOPY TIME:  Fluoroscopy Time:  1 minutes and 24 seconds Radiation Exposure Index (if provided by the fluoroscopic device): 47.2 mGy COMPARISON:  01/12/2018 FINDINGS: Patient drank water soluble contrast in a semi upright position. The esophagus is diffusely dilated. Contrast pools in the distal esophagus. Luminal narrowing at the GE junction, presumably at the fundoplication region. Contrast drains into the stomach. There is no evidence for extravasation or leak. Contrast travels through the stomach into the duodenum. The duodenum is mildly distended. Intermittent fluoroscopy was obtained  with the patient in an upright position to evaluate the retained contrast in the distal esophagus. There was pooling contrast in the distal esophagus approximately 5 minutes after drinking. Patient was asymptomatic throughout the procedure. IMPRESSION: 1. No evidence for a postoperative leak. 2. Diffusely dilated esophagus. Luminal narrowing presumably related to the fundoplication and postoperative edema. Contrast drains into the stomach and duodenum. 3. Retained contrast in the distal esophagus at the end of the procedure. 4. These results were called by telephone at the time of interpretation on 06/06/2018 at 10:58 am to Dr. Rolm Bookbinder, who verbally acknowledged these results. Electronically Signed   By: Markus Daft M.D.   On: 06/06/2018 [place] , [facility] 06/07/2018, 8:38 AM  Pager: [phone] Mon-Wed, Friday 7:00am-4:30pm Thurs 7am-11:30am  Consults: [phone]

## 2018-06-07 NOTE — Progress Notes (Signed)
  Echocardiogram 2D Echocardiogram has been performed.  [name] 06/07/2018, 10:31 AM

## 2018-06-08 ENCOUNTER — Encounter (HOSPITAL_COMMUNITY): Payer: Self-pay

## 2018-06-08 DIAGNOSIS — I480 Paroxysmal atrial fibrillation: Secondary | ICD-10-CM

## 2018-06-08 LAB — BASIC METABOLIC PANEL
ANION GAP: 7 (ref 5–15)
BUN: 12 mg/dL (ref 8–23)
CO2: 26 mmol/L (ref 22–32)
Calcium: 7.8 mg/dL — ABNORMAL LOW (ref 8.9–10.3)
Chloride: 100 mmol/L (ref 98–111)
Creatinine, Ser: 1.22 mg/dL — ABNORMAL HIGH (ref 0.44–1.00)
GFR calc Af Amer: 48 mL/min — ABNORMAL LOW (ref >60)
GFR calc non Af Amer: 42 mL/min — ABNORMAL LOW (ref >60)
Glucose, Bld: 137 mg/dL — ABNORMAL HIGH (ref 70–99)
POTASSIUM: 3.3 mmol/L — AB (ref 3.5–5.1)
Sodium: 133 mmol/L — ABNORMAL LOW (ref 135–145)

## 2018-06-08 LAB — CBC
HEMATOCRIT: 35.1 % — AB (ref 36.0–46.0)
Hemoglobin: 11 g/dL — ABNORMAL LOW (ref 12.0–15.0)
MCH: 26.1 pg (ref 26.0–34.0)
MCHC: 31.3 g/dL (ref 30.0–36.0)
MCV: 83.4 fL (ref 80.0–100.0)
PLATELETS: 177 10*3/uL (ref 150–400)
RBC: 4.21 MIL/uL (ref 3.87–5.11)
RDW: 15.2 % (ref 11.5–15.5)
WBC: 12.2 10*3/uL — ABNORMAL HIGH (ref 4.0–10.5)
nRBC: 0 % (ref 0.0–0.2)

## 2018-06-08 LAB — TROPONIN I
Troponin I: 5.01 ng/mL (ref <0.03)
Troponin I: 4.34 ng/mL (ref <0.03)
Troponin I: 4.39 ng/mL (ref <0.03)

## 2018-06-08 LAB — TSH: TSH: 1.95 u[IU]/mL (ref 0.350–4.500)

## 2018-06-08 MED ORDER — AMIODARONE HCL IN DEXTROSE 360-4.14 MG/200ML-% IV SOLN
60.0000 mg/h | INTRAVENOUS | Status: AC
  Administered 2018-06-08: 60 mg/h via INTRAVENOUS
  Filled 2018-06-08 (×2): qty 200

## 2018-06-08 MED ORDER — AMIODARONE LOAD VIA INFUSION
150.0000 mg | Freq: Once | INTRAVENOUS | Status: AC
  Administered 2018-06-08: 150 mg via INTRAVENOUS
  Filled 2018-06-08: qty 83.34

## 2018-06-08 MED ORDER — AMIODARONE HCL IN DEXTROSE 360-4.14 MG/200ML-% IV SOLN
30.0000 mg/h | INTRAVENOUS | Status: DC
  Administered 2018-06-08 – 2018-06-10 (×4): 30 mg/h via INTRAVENOUS
  Filled 2018-06-08 (×3): qty 200

## 2018-06-08 MED ORDER — POTASSIUM CHLORIDE CRYS ER 20 MEQ PO TBCR
40.0000 meq | EXTENDED_RELEASE_TABLET | Freq: Two times a day (BID) | ORAL | Status: AC
  Administered 2018-06-08 (×2): 40 meq via ORAL
  Filled 2018-06-08 (×2): qty 2

## 2018-06-08 MED ORDER — APIXABAN 5 MG PO TABS
5.0000 mg | ORAL_TABLET | Freq: Two times a day (BID) | ORAL | Status: DC
  Administered 2018-06-08 – 2018-06-11 (×7): 5 mg via ORAL
  Filled 2018-06-08 (×7): qty 1

## 2018-06-08 NOTE — Progress Notes (Signed)
Progress Note  Patient Name: [name] Date of Encounter: 06/08/2018  Primary Cardiologist: Dr. Bronson Ing  Subjective   Postop day 3 robotic hiatal hernia repair by Dr. Johney Maine.  Apparently this was a fairly long complicated operation.  The patient does have a history of PAF in the past as well as CAD status post LAD stenting in 2013 by Dr. Velva Harman.  She went into A. fib with RVR.  Her troponins rose to 10 although she is completely asymptomatic.  Inpatient Medications    Scheduled Meds: . acetaminophen  1,000 mg Oral Q8H  . apixaban  5 mg Oral BID  . aspirin EC  81 mg Oral Daily  . fluticasone  2 spray Each Nare Daily  . gabapentin  300 mg Oral BID  . isosorbide mononitrate  30 mg Oral q morning - 10a  . levothyroxine  50 mcg Oral QAC breakfast  . lip balm  1 application Topical BID  . loratadine  10 mg Oral QHS  . mouth rinse  15 mL Mouth Rinse BID  . metoprolol succinate  12.5 mg Oral Daily  . montelukast  10 mg Oral QHS  . rosuvastatin  10 mg Oral q1800   Continuous Infusions: . albumin human    . diltiazem (CARDIZEM) infusion Stopped (06/08/18 0234)   PRN Meds: albumin human, albuterol, ALPRAZolam, alum & mag hydroxide-simeth, bisacodyl, diphenhydrAMINE **OR** diphenhydrAMINE, enalaprilat, guaiFENesin-dextromethorphan, hydrALAZINE, hydrocortisone, hydrocortisone cream, HYDROmorphone (DILAUDID) injection, magic mouthwash, meclizine, menthol-cetylpyridinium, methocarbamol, metoprolol tartrate, nitroGLYCERIN, nystatin, ondansetron **OR** ondansetron (ZOFRAN) IV, phenol, polyethylene glycol, polyvinyl alcohol, prochlorperazine **OR** prochlorperazine, simethicone, zolpidem   Vital Signs    Vitals:   06/08/18 0530 06/08/18 0600 06/08/18 0630 06/08/18 0700  BP: 136/74 130/80 (!) 154/74   Pulse: 93 85 87 (!) 117  Resp: 19 18 20  (!) 26  Temp:      TempSrc:      SpO2: 99% 100% 100% 100%  Weight:   76.5 kg   Height:        Intake/Output Summary (Last 24 hours) at 06/08/2018  0826 Last data filed at 06/08/2018 0800 Gross per 24 hour  Intake 1227.28 ml  Output 1785 ml  Net -557.72 ml   Last 3 Weights 06/08/2018 06/07/2018 06/05/2018  Weight (lbs) 168 lb 10.4 oz 165 lb 5.5 oz 158 lb  Weight (kg) 76.5 kg 75 kg 71.668 kg      Telemetry    Atrial fibrillation with rapid ventricular response- Personally Reviewed  ECG    Atrial fibrillation 97- Personally Reviewed  Physical Exam   GEN: No acute distress.   Neck: No JVD Cardiac: RRR, no murmurs, rubs, or gallops.  Respiratory: Clear to auscultation bilaterally. GI: Soft, nontender, non-distended  MS: No edema; No deformity. Neuro:  Nonfocal  Psych: Normal affect   Labs    Chemistry Recent Labs  Lab 06/01/18 0939 06/06/18 2339  NA 139 134*  K 4.4 3.7  CL 103 105  CO2 30 22  GLUCOSE 113* 118*  BUN 22 15  CREATININE 1.65* 1.17*  CALCIUM 9.6 7.6*  PROT 6.9  --   ALBUMIN 4.1  --   AST 20  --   ALT 14  --   ALKPHOS 58  --   BILITOT 0.5  --   GFRNONAA 29* 44*  GFRAA 33* 51*  ANIONGAP 6 7     Hematology Recent Labs  Lab 06/01/18 0939  WBC 5.3  RBC 4.79  HGB 12.7  HCT 42.6  MCV 88.9  MCH 26.5  MCHC 29.8*  RDW 15.7*  PLT 192    Cardiac Enzymes Recent Labs  Lab 06/06/18 2339  TROPONINI 10.58*   No results for input(s): TROPIPOC in the last 168 hours.   BNPNo results for input(s): BNP, PROBNP in the last 168 hours.   DDimer No results for input(s): DDIMER in the last 168 hours.   Radiology    Dg Chest Port 1 View  Result Date: 06/07/2018 CLINICAL DATA:  Tachycardia. EXAM: PORTABLE CHEST 1 VIEW COMPARISON:  Chest CT 06/04/2016 FINDINGS: Post recent repair of hiatal hernia with surgical drain in place. The heart is enlarged. Elevation of left hemidiaphragm. Mild left greater than right basilar atelectasis. No confluent airspace disease. No pulmonary edema, large pleural effusion or pneumothorax. IMPRESSION: Cardiomegaly with left greater than right basilar atelectasis.  Electronically Signed   By: Keith Rake M.D.   On: 06/07/2018 00:00   Dg Esophagus W Single Cm (sol Or Thin Ba)  Result Date: 06/06/2018 CLINICAL DATA:  84 year old female with hiatal hernia repair and fundoplication. EXAM: ESOPHOGRAM/BARIUM SWALLOW TECHNIQUE: Single contrast examination was performed using water-soluble contrast. FLUOROSCOPY TIME:  Fluoroscopy Time:  1 minutes and 24 seconds Radiation Exposure Index (if provided by the fluoroscopic device): 47.2 mGy COMPARISON:  01/12/2018 FINDINGS: Patient drank water soluble contrast in a semi upright position. The esophagus is diffusely dilated. Contrast pools in the distal esophagus. Luminal narrowing at the GE junction, presumably at the fundoplication region. Contrast drains into the stomach. There is no evidence for extravasation or leak. Contrast travels through the stomach into the duodenum. The duodenum is mildly distended. Intermittent fluoroscopy was obtained with the patient in an upright position to evaluate the retained contrast in the distal esophagus. There was pooling contrast in the distal esophagus approximately 5 minutes after drinking. Patient was asymptomatic throughout the procedure. IMPRESSION: 1. No evidence for a postoperative leak. 2. Diffusely dilated esophagus. Luminal narrowing presumably related to the fundoplication and postoperative edema. Contrast drains into the stomach and duodenum. 3. Retained contrast in the distal esophagus at the end of the procedure. 4. These results were called by telephone at the time of interpretation on 06/06/2018 at 10:58 am to Dr. Rolm Bookbinder, who verbally acknowledged these results. Electronically Signed   By: Markus Daft M.D.   On: 06/06/2018 11:01    Cardiac Studies   2D echocardiogram (06/07/2018)  IMPRESSIONS    1. The left ventricle has normal systolic function with an ejection fraction of 60-65%. The cavity size was normal. Left ventricular diastolic Doppler parameters are  indeterminate in the setting of atrial fibrillation. No obvious wall motion  abnormalities.  2. The right ventricle has normal systolic function. The cavity was normal. There is no increase in right ventricular wall thickness. Right ventricular systolic pressure upper normal with an estimated pressure of 34.6 mmHg.  3. Left atrial size was moderately dilated.  4. The aortic valve is tricuspid. Mild calcification of the aortic valve. Moderate aortic annular calcification noted.  5. The mitral valve is normal in structure. Mitral valve regurgitation is mild to moderate by color flow Doppler.  6. The tricuspid valve is normal in structure.  7. The aortic root is normal in size and structure.  Patient Profile     84 y.o. female with a history of chronic diastolic heart failure, pulmonary hypertension, paroxysmal atrial fibrillation, CKD stage III, and CAD status post DES to the proximal LAD in 2013.  Now being evaluated by cardiology with postoperative atrial fibrillation  and enzymatic evidence of NSTEMI following robotic repair of paraesophageal hernia on March 6.  Assessment & Plan    1: Atrial fibrillation with rapid ventricular response- history of PAF in the past on Eliquis which was discontinued by Dr. Bronson Ing because of anemia.  She currently is in A. fib with heart rates in the low 100 range on low-dose beta-blocker.  Blood pressure is somewhat soft.  I am going to begin her on Eliquis and start IV amiodarone for chemical cardioversion.  2: Non-STEMI- troponins rose to 10.  Patient is completely asymptomatic.  She did have a LAD stent by Dr. Fletcher Anon in 2013 with a negative Myoview in 2016.  She is on low-dose aspirin.  At this point, I do not feel compelled to pursue an invasive catheterization approach.  I suspect her elevated enzymes are demand ischemia related to rapid ventricular response from her A. fib.  Uncomfortable getting a pharmacologic Myoview stress test as an outpatient to further  evaluate.  3: Coronary artery disease- history of LAD stenting by Dr. Billey Gosling 2013 with a negative Myoview stress test in 2016.  The patient denies chest pain.       For questions or updates, please contact [place] Please consult [email or web address] for contact info under        Signed, Quay Burow, MD  06/08/2018, 8:26 AM

## 2018-06-08 NOTE — Progress Notes (Signed)
Yue Flanigan Zakarian [id number] 12/31/1934  CARE TEAM:  PCP: Terald Sleeper, PA-C  Outpatient Care Team: Patient Care Team: [name] as PCP - General (General Practice) Okey Regal, [place] (Optometry) Herminio Commons, MD as Attending Physician (Cardiology) Truitt Merle, MD as Consulting Physician (Hematology) Leighton Ruff, MD as Consulting Physician (Colon and Rectal Surgery) Michael Boston, MD as Consulting Physician (General Surgery) Pyrtle, Lajuan Lines, MD as Consulting Physician (Gastroenterology) Fran Lowes, MD as Consulting Physician (Nephrology)  Inpatient Treatment Team: Treatment Team: Attending Provider: Michael Boston, MD; Technician: Raynelle Highland, NT; Consulting Physician: Lbcardiology, Michae Kava, MD; Registered Nurse: Chriss Driver, RN; Registered Nurse: [place], Olga Millers, RN; Occupational Therapist: Jaci Carrel, OT; Registered Nurse: France Ravens, RN; Physical Therapist: Duncan Dull, PT   Problem List:   Principal Problem:   Incarcerated recurrent hiatal hernia s/p robotic repair 06/05/2018 Active Problems:   CKD (chronic kidney disease) stage 3, GFR 30-59 ml/min (HCC)   Anemia due to chronic blood loss   Essential hypertension   Gastric volvulus   Irritable bowel syndrome with constipation   GERD (gastroesophageal reflux disease) s/p Toupet fundoplication (partial posterior wrap)   S/P robotic partial posterior (Toupet) fundoplication 10/30/8297   Pulmonary hypertension ([place])   Hypothyroidism   History of [name]'s esophagus   Anxiety state   3 Days Post-Op  06/05/2018  POST-OPERATIVE DIAGNOSIS:  Incarcerated, recurrent Hiatal Hernia with organoaxial volvulus  PROCEDURE:   XI ROBOTIC REPAIR PARASOPHAGEAL HIATAL HERNIA ABSORBABLE MESH REINFORCEMENT TOUPET FUNDOPLICATION LYSIS OF ADHESIONS X 3 HOURS EGD  SURGEON:  Adin Hector, MD   Assessment  Recovering  [facility] Stay = 3 days)  Plan:  -Dys1 diet as tolerated.  Stay  on that for ~2 weeks until wrap edema resolves   -defer cardiac workup for postop Afib & elevated troponin to Cardiology - help appreciated.  D/w Dr Gwenlyn Found - he will see. -ICU / SDU / telemetry per cardiology -anticoagulation as needed -continue mediastinal drain -hypothyroidism - synthroid -hyperchol - Crestor -VTE prophylaxis- SCDs, etc -mobilize as tolerated to help recovery  45 minutes spent in review, evaluation, examination, counseling, and coordination of care.  More than 50% of that time was spent in counseling.  06/08/2018    Subjective: (Chief complaint)  RN in room Weaned off diltiazem gtt.  Taking metoprolol Feels great Tolerating clears.  Hungry ("It's been a long time since I have felt that") Mobilizing w nursing  Denies chest pain ECHO done - normal EF with no pericardial effusion   Objective:  Vital signs:  Vitals:   06/08/18 0530 06/08/18 0600 06/08/18 0630 06/08/18 0700  BP: 136/74 130/80 (!) 154/74   Pulse: 93 85 87 (!) 117  Resp: 19 18 20  (!) 26  Temp:      TempSrc:      SpO2: 99% 100% 100% 100%  Weight:   76.5 kg   Height:        Last BM Date: 06/07/18  Intake/Output   Yesterday:  03/08 0701 - 03/09 0700 In: 1187.2 [P.O.:960; I.V.:227.2] Out: 1745 [Urine:1325; Drains:270; Stool:150] This shift:  No intake/output data recorded.  Bowel function:  Flatus: YES  BM:  YES  Drain: Serosanguinous   Physical Exam:  General: Pt awake/alert/oriented x4 in no acute distress.  Smiling - gives me warm handshake & big smile Eyes: PERRL, normal EOM.  Sclera clear.  No icterus Neuro: CN II-XII intact w/o focal sensory/motor deficits. Lymph: No head/neck/groin lymphadenopathy Psych:  No delerium/psychosis/paranoia HENT:  Normocephalic, Mucus membranes moist.  No thrush Neck: Supple, No tracheal deviation Chest: No chest wall pain w good excursion CV:  Pulses intact.  Regular rhythm MS: Normal AROM mjr joints.  No obvious deformity  Abdomen:  Soft.  Nondistended.  Nontender.  No evidence of peritonitis.  No incarcerated hernias.  Ext:  No deformity.  No mjr edema.  No cyanosis Skin: No petechiae / purpura  Results:   Patient Name:   Joniyah I Rout Date of Exam: 06/07/2018 Medical Rec #:  [id number]  Height:       62.5 in Accession #:    [id number] Weight:       165.3 lb Date of Birth:  82-27-1936  BSA:          1.77 m Patient Age:    84 years   BP:           129/76 mmHg Patient Gender: F          HR:           68 bpm. Exam Location:  Inpatient    Procedure: 2D Echo  Indications:    Atrial Fibrillation 427.31   History:        Patient has prior history of Echocardiogram examinations, most                 recent 05/29/2017. CHF, CAD, Pulmonary HTN; Risk Factors:                 Hypertension.   Sonographer:    Mikki Santee RDCS (AE) Referring Phys: [place]    1. The left ventricle has normal systolic function with an ejection fraction of 60-65%. The cavity size was normal. Left ventricular diastolic Doppler parameters are indeterminate in the setting of atrial fibrillation. No obvious wall motion  abnormalities.  2. The right ventricle has normal systolic function. The cavity was normal. There is no increase in right ventricular wall thickness. Right ventricular systolic pressure upper normal with an estimated pressure of 34.6 mmHg.  3. Left atrial size was moderately dilated.  4. The aortic valve is tricuspid. Mild calcification of the aortic valve. Moderate aortic annular calcification noted.  5. The mitral valve is normal in structure. Mitral valve regurgitation is mild to moderate by color flow Doppler.  6. The tricuspid valve is normal in structure.  7. The aortic root is normal in size and structure.  FINDINGS  Left Ventricle: The left ventricle has normal systolic function, with an ejection fraction of 60-65%. The cavity size was normal. There is no increase in left ventricular  wall thickness. Left ventricular diastolic Doppler parameters are indeterminate Right Ventricle: The right ventricle has normal systolic function. The cavity was normal. There is no increase in right ventricular wall thickness. Right ventricular systolic pressure upper normal with an estimated pressure of 34.6 mmHg. Left Atrium: left atrial size was moderately dilated Right Atrium: right atrial size was mild-moderately dilated. Right atrial pressure is estimated at 3 mmHg. Interatrial Septum: No atrial level shunt detected by color flow Doppler. Pericardium: There is no evidence of pericardial effusion. There is a pericardial fat pad noted. Mitral Valve: The mitral valve is normal in structure. Mitral valve regurgitation is mild to moderate by color flow Doppler. Tricuspid Valve: The tricuspid valve is normal in structure. Tricuspid valve regurgitation is mild by color flow Doppler. Aortic Valve: The aortic valve is tricuspid Mild calcification of the aortic valve. Aortic valve regurgitation was not visualized by color  flow Doppler. Moderate aortic annular calcification noted. Pulmonic Valve: The pulmonic valve was grossly normal. Pulmonic valve regurgitation is trivial by color flow Doppler. Aorta: The aortic root is normal in size and structure.   LEFT VENTRICLE PLAX 2D (Teich) LV EF:          54.4 % LVIDd:          4.30 cm LVIDs:          3.10 cm LV PW:          0.90 cm LV IVS:         0.90 cm LVOT diam:      2.20 cm LV SV:          45 ml LVOT Area:      3.80 cm  RIGHT VENTRICLE RVSP:           34.6 mmHg  LEFT ATRIUM              Index       RIGHT ATRIUM           Index LA diam:        4.40 cm  2.48 cm/m  RA Pressure: 3 mmHg LA Vol (A2C):   56.6 ml  31.91 ml/m RA Area:     21.40 cm LA Vol (A4C):   115.0 ml 64.84 ml/m RA Volume:   65.10 ml  36.70 ml/m LA Biplane Vol: 80.6 ml  45.44 ml/m  AORTIC VALVE LVOT Vmax:   84.40 cm/s LVOT Vmean:  57.000 cm/s LVOT VTI:    0.133 m     AORTA Ao Root diam: 3.00 cm  MR Peak grad: 113.2 mmHg  TRICUSPID VALVE MR Vmax:      532.00 cm/s TR Peak grad:   31.6 mmHg                           TR Vmax:        281.00 cm/s                           RVSP:           34.6 mmHg    Rozann Lesches MD Electronically signed by Rozann Lesches MD Signature Date/Time: 06/07/2018/11:26:23 AM     Labs: Results for orders placed or performed during the hospital encounter of 06/05/18 (from the past 48 hour(s))  Basic metabolic panel     Status: Abnormal   Collection Time: 06/06/18 11:39 PM  Result Value Ref Range   Sodium 134 (L) 135 - 145 mmol/L   Potassium 3.7 3.5 - 5.1 mmol/L   Chloride 105 98 - 111 mmol/L   CO2 22 22 - 32 mmol/L   Glucose, Bld 118 (H) 70 - 99 mg/dL   BUN 15 8 - 23 mg/dL   Creatinine, Ser 1.17 (H) 0.44 - 1.00 mg/dL   Calcium 7.6 (L) 8.9 - 10.3 mg/dL   GFR calc non Af Amer 44 (L) >60 mL/min   GFR calc Af Amer 51 (L) >60 mL/min   Anion gap 7 5 - 15    Comment: Performed at [facility], [place] [address], [place], [place] [address]  Troponin I - Now Then Q6H     Status: Abnormal   Collection Time: 06/06/18 11:39 PM  Result Value Ref Range   Troponin I 10.58 (HH) <0.03 ng/mL    Comment: CRITICAL RESULT CALLED TO, READ BACK  BY AND VERIFIED WITH: Dorthula Nettles RN 0120 06/07/2018 [place] Performed at [facility], [place] [address], [place], [place] [address]   MRSA PCR Screening     Status: None   Collection Time: 06/07/18  4:06 AM  Result Value Ref Range   MRSA by PCR NEGATIVE NEGATIVE    Comment:        The GeneXpert MRSA Assay (FDA approved for NASAL specimens only), is one component of a comprehensive MRSA colonization surveillance program. It is not intended to diagnose MRSA infection nor to guide or monitor treatment for MRSA infections. Performed at [facility], [place] [address], [place], [place] [address]     Imaging / Studies: Dg Chest Port 1 View  Result  Date: 06/07/2018 CLINICAL DATA:  Tachycardia. EXAM: PORTABLE CHEST 1 VIEW COMPARISON:  Chest CT 06/04/2016 FINDINGS: Post recent repair of hiatal hernia with surgical drain in place. The heart is enlarged. Elevation of left hemidiaphragm. Mild left greater than right basilar atelectasis. No confluent airspace disease. No pulmonary edema, large pleural effusion or pneumothorax. IMPRESSION: Cardiomegaly with left greater than right basilar atelectasis. Electronically Signed   By: Keith Rake M.D.   On: 06/07/2018 00:00   Dg Esophagus W Single Cm (sol Or Thin Ba)  Result Date: 06/06/2018 CLINICAL DATA:  84 year old female with hiatal hernia repair and fundoplication. EXAM: ESOPHOGRAM/BARIUM SWALLOW TECHNIQUE: Single contrast examination was performed using water-soluble contrast. FLUOROSCOPY TIME:  Fluoroscopy Time:  1 minutes and 24 seconds Radiation Exposure Index (if provided by the fluoroscopic device): 47.2 mGy COMPARISON:  01/12/2018 FINDINGS: Patient drank water soluble contrast in a semi upright position. The esophagus is diffusely dilated. Contrast pools in the distal esophagus. Luminal narrowing at the GE junction, presumably at the fundoplication region. Contrast drains into the stomach. There is no evidence for extravasation or leak. Contrast travels through the stomach into the duodenum. The duodenum is mildly distended. Intermittent fluoroscopy was obtained with the patient in an upright position to evaluate the retained contrast in the distal esophagus. There was pooling contrast in the distal esophagus approximately 5 minutes after drinking. Patient was asymptomatic throughout the procedure. IMPRESSION: 1. No evidence for a postoperative leak. 2. Diffusely dilated esophagus. Luminal narrowing presumably related to the fundoplication and postoperative edema. Contrast drains into the stomach and duodenum. 3. Retained contrast in the distal esophagus at the end of the procedure. 4. These results  were called by telephone at the time of interpretation on 06/06/2018 at 10:58 am to Dr. Rolm Bookbinder, who verbally acknowledged these results. Electronically Signed   By: Markus Daft M.D.   On: 06/06/2018 11:01    Medications / Allergies: per chart  Antibiotics: Anti-infectives (From admission, onward)   Start     Dose/Rate Route Frequency Ordered Stop   06/05/18 1700  metroNIDAZOLE (FLAGYL) IVPB 500 mg     500 mg 100 mL/hr over 60 Minutes Intravenous Every 8 hours 06/05/18 1621 06/05/18 1813   06/05/18 0600  cefTRIAXone (ROCEPHIN) 2 g in sodium chloride 0.9 % 100 mL IVPB     2 g 200 mL/hr over 30 Minutes Intravenous On call to O.R. 06/05/18 0541 06/05/18 1626   06/05/18 0600  metroNIDAZOLE (FLAGYL) IVPB 500 mg     500 mg 100 mL/hr over 60 Minutes Intravenous On call to O.R. 06/05/18 0541 06/05/18 0849        Note: Portions of this report may have been transcribed using voice recognition software. Every effort was made to  ensure accuracy; however, inadvertent computerized transcription errors may be present.   Any transcriptional errors that result from this process are unintentional.     Adin Hector, MD, FACS, MASCRS Gastrointestinal and Minimally Invasive Surgery    [address] [place], [place] [address] [phone] Main / Paging [phone] Fax

## 2018-06-08 NOTE — Progress Notes (Signed)
Physical Therapy Treatment Patient Details Name: [name] MRN: [id number] DOB: 1934-07-12 Today's Date: 06/08/2018    History of Present Illness Pt is an 84 yo female s/p laparoscopic repair of incarcerated hiatal hernia with lysis of adhesions.  Pt with hx of PAF, CAD, colon CA s/p colostomy, CHF and macular degeneration    PT Comments    Patient seen for activity progression. Tolerated well. Ambulated without device on room air with saturations >95%. HR elevation fluctuating between 102 and 136 peak. Current POC remains appropriate.   Follow Up Recommendations  No PT follow up     Equipment Recommendations  None recommended by PT    Recommendations for Other Services OT consult     Precautions / Restrictions Precautions Precautions: Fall Precaution Comments: colostomy bag, JP drain Restrictions Weight Bearing Restrictions: No    Mobility  Bed Mobility               General bed mobility comments: received in bedside recliner  Transfers Overall transfer level: Needs assistance Equipment used: None Transfers: Sit to/from Stand Sit to Stand: Min guard         General transfer comment: Min guard for safety, no physical assist required.   Ambulation/Gait Ambulation/Gait assistance: Min guard Gait Distance (Feet): 120 Feet Assistive device: None Gait Pattern/deviations: Step-through pattern;Decreased step length - right;Decreased step length - left;Shuffle;Trunk flexed Gait velocity: decr   General Gait Details: modest instability, VCs for increased cadence and pursed lip breathing   Stairs             Wheelchair Mobility    Modified Rankin (Stroke Patients Only)       Balance Overall balance assessment: Needs assistance Sitting-balance support: Feet supported;No upper extremity supported Sitting balance-Leahy Scale: Good     Standing balance support: No upper extremity supported Standing balance-Leahy Scale: Fair                               Cognition Arousal/Alertness: Awake/alert Behavior During Therapy: WFL for tasks assessed/performed Overall Cognitive Status: Within Functional Limits for tasks assessed                                        Exercises      General Comments        Pertinent Vitals/Pain Pain Assessment: No/denies pain    Home Living                      Prior Function            PT Goals (current goals can now be found in the care plan section) Acute Rehab PT Goals Patient Stated Goal: Regain IND PT Goal Formulation: With patient Time For Goal Achievement: 06/20/18 Potential to Achieve Goals: Good Progress towards PT goals: Progressing toward goals    Frequency    Min 3X/week      PT Plan Current plan remains appropriate    Co-evaluation PT/OT/SLP Co-Evaluation/Treatment: (dove tailed session)            AM-PAC PT "6 Clicks" Mobility   Outcome Measure  Help needed turning from your back to your side while in a flat bed without using bedrails?: A Little Help needed moving from lying on your back to sitting on the side of a flat bed without using bedrails?: A  Little Help needed moving to and from a bed to a chair (including a wheelchair)?: A Little Help needed standing up from a chair using your arms (e.g., wheelchair or bedside chair)?: A Little Help needed to walk in hospital room?: A Little Help needed climbing 3-5 steps with a railing? : A Lot 6 Click Score: 17    End of Session Equipment Utilized During Treatment: Oxygen(intially then at end of session 3 L) Activity Tolerance: Patient limited by fatigue Patient left: in chair;with call bell/phone within reach Nurse Communication: Mobility status PT Visit Diagnosis: Difficulty in walking, not elsewhere classified (R26.2)     Time: 1030-1041 PT Time Calculation (min) (ACUTE ONLY): 11 min  Charges:  $Gait Training: 8-22 mins                     Alben Deeds, PT DPT   Board Certified Neurologic Specialist [place] Pager [phone] Office [place] 06/08/2018, 10:42 AM

## 2018-06-08 NOTE — Discharge Instructions (Addendum)
EATING AFTER YOUR ESOPHAGEAL SURGERY (Stomach Fundoplication, Hiatal Hernia repair, Achalasia surgery, etc)  ######################################################################  EAT Start with a pureed / full liquid diet (see below) Gradually transition to a high fiber diet with a fiber supplement over the next month after discharge.    WALK Walk an hour a day.  Control your pain to do that.    CONTROL PAIN Control pain so that you can walk, sleep, tolerate sneezing/coughing, go up/down stairs.  HAVE A BOWEL MOVEMENT DAILY Keep your bowels regular to avoid problems.  OK to try a laxative to override constipation.  OK to use an antidairrheal to slow down diarrhea.  Call if not better after 2 tries  CALL IF YOU HAVE PROBLEMS/CONCERNS Call if you are still struggling despite following these instructions. Call if you have concerns not answered by these instructions  ######################################################################   After your esophageal surgery, expect some sticking with swallowing over the next 1-2 months.    If food sticks when you eat, it is called "dysphagia".  This is due to swelling around your esophagus at the wrap & hiatal diaphragm repair.  It will gradually ease off over the next few months.  To help you through this temporary phase, we start you out on a pureed (blenderized) diet.  Your first meal in the hospital was thin liquids.  You should have been given a pureed diet by the time you left the hospital.  We ask patients to stay on a pureed diet for the first 2-3 weeks to avoid anything getting "stuck" near your recent surgery.  Don't be alarmed if your ability to swallow doesn't progress according to this plan.  Everyone is different and some diets can advance more or less quickly.     Some BASIC RULES to follow are:  Maintain an upright position whenever eating or drinking.  Take small bites - just a teaspoon size bite at a time.  Eat slowly.   It may also help to eat only one food at a time.  Consider nibbling through smaller, more frequent meals & avoid the urge to eat BIG meals  Do not push through feelings of fullness, nausea, or bloatedness  Do not mix solid foods and liquids in the same mouthful  Try not to "wash foods down" with large gulps of liquids.  Avoid carbonated (bubbly/fizzy) drinks.    Avoid foods that make you feel gassy or bloated.  Start with bland foods first.  Wait on trying greasy, fried, or spicy meals until you are tolerating more bland solids well.  Understand that it will be hard to burp and belch at first.  This gradually improves with time.  Expect to be more gassy/flatulent/bloated initially.  Walking will help your body manage it better.  Consider using medications for bloating that contain simethicone such as  Maalox or Gas-X   Eat in a relaxed atmosphere & minimize distractions.  Avoid talking while eating.    Do not use straws.  Following each meal, sit in an upright position (90 degree angle) for 60 to 90 minutes.  Going for a short walk can help as well  If food does stick, don't panic.  Try to relax and let the food pass on its own.  Sipping WARM LIQUID such as strong hot black tea can also help slide it down.   Be gradual in changes & use common sense:  -If you easily tolerating a certain "level" of foods, advance to the next level gradually -If you are  having trouble swallowing a particular food, then avoid it.   -If food is sticking when you advance your diet, go back to thinner previous diet (the lower LEVEL) for 1-2 days.  LEVEL 1 = PUREED DIET  Do for the first 2 WEEKS AFTER SURGERY  -Foods in this group are pureed or blenderized to a smooth, mashed potato-like consistency.  -If necessary, the pureed foods can keep their shape with the addition of a thickening agent.   -Meat should be pureed to a smooth, pasty consistency.  Hot broth or gravy may be added to the pureed  meat, approximately 1 oz. of liquid per 3 oz. serving of meat. -CAUTION:  If any foods do not puree into a smooth consistency, swallowing will be more difficult.  (For example, nuts or seeds sometimes do not blend well.)  Hot Foods Cold Foods  Pureed scrambled eggs and cheese Pureed cottage cheese  Baby cereals Thickened juices and nectars  Thinned cooked cereals (no lumps) Thickened milk or eggnog  Pureed [place] toast or pancakes Ensure  Mashed potatoes Ice cream  Pureed parsley, au gratin, scalloped potatoes, candied sweet potatoes Fruit or [place] ice, sherbet  Pureed buttered or alfredo noodles Plain yogurt  Pureed vegetables (no corn or peas) Instant breakfast  Pureed soups and creamed soups Smooth pudding, mousse, custard  Pureed scalloped apples Whipped gelatin  Gravies Sugar, syrup, honey, jelly  Sauces, cheese, tomato, barbecue, white, creamed Cream  Any baby food Creamer  Alcohol in moderation (not beer or champagne) Margarine  Coffee or tea Mayonnaise   Ketchup, mustard   Apple sauce   SAMPLE MENU:  PUREED DIET Breakfast Lunch Dinner   Orange juice, 1/2 cup  Cream of wheat, 1/2 cup  Pineapple juice, 1/2 cup  Pureed [place], barley soup, 3/4 cup  Pureed Hawaiian chicken, 3 oz   Scrambled eggs, mashed or blended with cheese, 1/2 cup  Tea or coffee, 1 cup   Whole milk, 1 cup   Non-dairy creamer, 2 Tbsp.  Mashed potatoes, 1/2 cup  Pureed cooled broccoli, 1/2 cup  Apple sauce, 1/2 cup  Coffee or tea  Mashed potatoes, 1/2 cup  Pureed spinach, 1/2 cup  Frozen yogurt, 1/2 cup  Tea or coffee      LEVEL 2 = SOFT DIET  After your first 2 weeks, you can advance to a soft diet.   Keep on this diet until everything goes down easily.  Hot Foods Cold Foods  White fish Cottage cheese  Stuffed fish Junior baby fruit  Baby food meals Semi thickened juices  Minced soft cooked, scrambled, poached eggs nectars  Souffle & omelets Ripe mashed bananas  Cooked  cereals Canned fruit, pineapple sauce, milk  potatoes Milkshake  Buttered or Alfredo noodles Custard  Cooked cooled vegetable Puddings, including tapioca  Sherbet Yogurt  Vegetable soup or alphabet soup Fruit ice, [place] ice  Gravies Whipped gelatin  Sugar, syrup, honey, jelly Junior baby desserts  Sauces:  Cheese, creamed, barbecue, tomato, white Cream  Coffee or tea Margarine   SAMPLE MENU:  LEVEL 2 Breakfast Lunch Dinner   Orange juice, 1/2 cup  Oatmeal, 1/2 cup  Scrambled eggs with cheese, 1/2 cup  Decaffeinated tea, 1 cup  Whole milk, 1 cup  Non-dairy creamer, 2 Tbsp  Pineapple juice, 1/2 cup  Minced beef, 3 oz  Gravy, 2 Tbsp  Mashed potatoes, 1/2 cup  Minced fresh broccoli, 1/2 cup  Applesauce, 1/2 cup  Coffee, 1 cup  [place], barley soup, 3/4 cup  Minced Hawaiian chicken, 3 oz  Mashed potatoes, 1/2 cup  Cooked spinach, 1/2 cup  Frozen yogurt, 1/2 cup  Non-dairy creamer, 2 Tbsp      LEVEL 3 = CHOPPED DIET  -After all the foods in level 2 (soft diet) are passing through well you should advance up to more chopped foods.  -It is still important to cut these foods into small pieces and eat slowly.  Hot Foods Cold Foods  Poultry Cottage cheese  Chopped Swedish meatballs Yogurt  Meat salads (ground or flaked meat) Milk  Flaked fish (tuna) Milkshakes  Poached or scrambled eggs Soft, cold, dry cereal  Souffles and omelets Fruit juices or nectars  Cooked cereals Chopped canned fruit  Chopped [place] toast or pancakes Canned fruit cocktail  Noodles or pasta (no rice) Pudding, mousse, custard  Cooked vegetables (no frozen peas, corn, or mixed vegetables) Green salad  Canned small sweet peas Ice cream  Creamed soup or vegetable soup Fruit ice, [place] ice  Pureed vegetable soup or alphabet soup Non-dairy creamer  Ground scalloped apples Margarine  Gravies Mayonnaise  Sauces:  Cheese, creamed, barbecue, tomato, white Ketchup  Coffee or tea Mustard    SAMPLE MENU:  LEVEL 3 Breakfast Lunch Dinner   Orange juice, 1/2 cup  Oatmeal, 1/2 cup  Scrambled eggs with cheese, 1/2 cup  Decaffeinated tea, 1 cup  Whole milk, 1 cup  Non-dairy creamer, 2 Tbsp  Ketchup, 1 Tbsp  Margarine, 1 tsp  Salt, 1/4 tsp  Sugar, 2 tsp  Pineapple juice, 1/2 cup  Ground beef, 3 oz  Gravy, 2 Tbsp  Mashed potatoes, 1/2 cup  Cooked spinach, 1/2 cup  Applesauce, 1/2 cup  Decaffeinated coffee  Whole milk  Non-dairy creamer, 2 Tbsp  Margarine, 1 tsp  Salt, 1/4 tsp  Pureed [place], barley soup, 3/4 cup  Barbecue chicken, 3 oz  Mashed potatoes, 1/2 cup  Ground fresh broccoli, 1/2 cup  Frozen yogurt, 1/2 cup  Decaffeinated tea, 1 cup  Non-dairy creamer, 2 Tbsp  Margarine, 1 tsp  Salt, 1/4 tsp  Sugar, 1 tsp    LEVEL 4:  REGULAR FOODS  -Foods in this group are soft, moist, regularly textured foods.   -This level includes meat and breads, which tend to be the hardest things to swallow.   -Eat very slowly, chew well and continue to avoid carbonated drinks. -most people are at this level in 4-6 weeks  Hot Foods Cold Foods  Baked fish or skinned Soft cheeses - cottage cheese  Souffles and omelets Cream cheese  Eggs Yogurt  Stuffed shells Milk  Spaghetti with meat sauce Milkshakes  Cooked cereal Cold dry cereals (no nuts, dried fruit, coconut)  [place] toast or pancakes Crackers  Buttered toast Fruit juices or nectars  Noodles or pasta (no rice) Canned fruit  Potatoes (all types) Ripe bananas  Soft, cooked vegetables (no corn, lima, or baked beans) Peeled, ripe, fresh fruit  Creamed soups or vegetable soup Cakes (no nuts, dried fruit, coconut)  Canned chicken noodle soup Plain doughnuts  Gravies Ice cream  Bacon dressing Pudding, mousse, custard  Sauces:  Cheese, creamed, barbecue, tomato, white Fruit ice, [place] ice, sherbet  Decaffeinated tea or coffee Whipped gelatin  Pork chops Regular gelatin   Canned fruited  gelatin molds   Sugar, syrup, honey, jam, jelly   Cream   Non-dairy   Margarine   Oil   Mayonnaise   Ketchup   Mustard   TROUBLESHOOTING IRREGULAR BOWELS  1) Avoid extremes of bowel  movements (no bad constipation/diarrhea)  2) Miralax 17gm mixed in [place]. water or juice-daily. May use BID as needed.  3) Gas-x,Phazyme, etc. as needed for gas & bloating.  4) Soft,bland diet. No spicy,greasy,fried foods.  5) Prilosec over-the-counter as needed  6) May hold gluten/wheat products from diet to see if symptoms improve.  7) May try probiotics (Align, Activa, etc) to help calm the bowels down  7) If symptoms become worse call back immediately.    If you have any questions please call our office at [place]: [phone].  DRAIN CARE:   You have a closed bulb drain to help you heal.    A bulb drain is a small, plastic reservoir which creates a gentle suction. It is used to remove excess fluid from a surgical wound. The color and amount of fluid will vary. Immediately after surgery, the fluid is bright red. It may gradually change to a yellow color. When the amount decreases to about 1 or 2 tablespoons (15 to 30 cc) per 24 hours, your caregiver will usually remove it.  JP Care  The Jackson-Pratt drainage system has flexible tubing attached to a soft, plastic bulb with a stopper. The drainage end of the tubing, which is flat and white, goes into your body through a small opening near your incision (surgical cut). A stitch holds the drainage end in place. The rest of the tube is outside your body, attached to the bulb. When the bulb is compressed with the stopper in place, it creates a vacuum. This causes a constant gentle suction, which helps draw out fluid that collects under your incision. The bulb should be compressed at all times, except when you are emptying the drainage.  How long you will have your Jackson-Pratt depends on your surgery and the amount of fluid is draining.  This is different for everyone. The Jackson-Pratt is usually removed when the drainage is 30 mL or less over 24 hours. To keep track of how much drainage youre having, you will record the amount in a drainage log. Its important to bring the log with you to your follow-up appointments.  Caring for Your Jackson-Pratt at Home In order to care for your Jackson-Pratt at home, you or your caregiver will do the following:  Empty the drain once a day and record the color and amount of drainage  Care for the area where the tubing enters your skin by washing with soap and water.  Milk the tubing to help move clots into the bulb.  Do this before you empty and measure your drainage. Look in the mirror at the tubing. This will help you see where your hands need to be. Pinch the tubing close to where it goes into your skin between your thumb and forefinger. With the thumb and forefinger of your other hand, pinch the tubing right below your other fingers. Keep your fingers pinched and slide them down the tubing, pushing any clots down toward the bulb. You may want to use alcohol swabs to help you slide your fingers down the tubing. Repeat steps 3 and 4 as necessary to push clots from the tubing into the bulb. If you are not able to move a clot into the bulb, call your doctors office. The fluid may leak around the insertion site if a clot is blocking the drainage flow. If there is fluid in the bulb and no leakage at the insertion site, the drain is working.  How to Empty Your Jackson-Pratt and Record  the Drainage You will need to empty your Jackson-Pratt every day  Gather the following supplies:  Measuring container your nurse gave you Jackson-Pratt Drainage Record  Pen or pencil  Instructions Clean an area to work on. Clean your hands thoroughly. Unplug the stopper on top of your Jackson-Pratt. This will cause the bulb to expand. Do not touch the inside of the stopper or the inner area of the opening  on the bulb. Turn your Jackson-Pratt upside down, gently squeeze the bulb, and pour the drainage into the measuring container. Turn your Jackson-Pratt right side up. Squeeze the bulb until your fingers feel the palm of your hand. Keep squeezing the bulb while you replug the stopper. Make sure the bulb stays fully compressed to ensure constant, gentle suction.    Check the amount and color of drainage in the measuring container. The first couple days after surgery the fluid may be dark red. This is normal. As you heal the fluid may look pink or pale yellow. Record this amount and the color of drainage on your Jackson-Pratt Drainage Record. Flush the drainage down the toilet and rinse the measuring container with water.  Caring for the Insertion Site  Once you have emptied the drainage, clean your hands again. Check the area around the insertion site. Look for tenderness, swelling, or pus. If you have any of these, or if you have a temperature of 101 F (38.3 C) or higher, you may have an infection. Call your doctors office.  Sometimes, the drain causes redness the size of a dime at your insertion site. This is normal. Your healthcare provider will tell you if you should place a bandage over the insertion site.  Wash drain site with soap & water (dilute hydrogen peroxide PRN) daily & replace clean dressing / tape    DAILY CARE  Keep the bulb compressed at all times, except while emptying it. The compression creates suction.   Keep sites where the tubes enter the skin dry and covered with a light bandage (dressing).   Tape the tubes to your skin, 1 to 2 inches below the insertion sites, to keep from pulling on your stitches. Tubes are stitched in place and will not slip out.   Pin the bulb to your shirt (not to your pants) with a safety pin.   For the first few days after surgery, there usually is more fluid in the bulb. Empty the bulb whenever it becomes half full because the bulb  does not create enough suction if it is too full. Include this amount in your 24 hour totals.   When the amount of drainage decreases, empty the bulb at the same time every day. Write down the amounts and the 24 hour totals. Your caregiver will want to know them. This helps your caregiver know when the tubes can be removed.   (We anticipate removing the drain in 1-3 weeks, depending on when the output is <18mL a day for 2+ days)  If there is drainage around the tube sites, change dressings and keep the area dry. If you see a clot in the tube, leave it alone. However, if the tube does not appear to be draining, let your caregiver know.  TO EMPTY THE BULB  Open the stopper to release suction.   Holding the stopper out of the way, pour drainage into the measuring cup that was sent home with you.   Measure and write down the amount. If there are 2 bulbs, note the amount  of drainage from bulb 1 or bulb 2 and keep the totals separate. Your caregiver will want to know which tube is draining more.   Compress the bulb by folding it in half.   Replace the stopper.   Check the tape that holds the tube to your skin, and pin the bulb to your shirt.  SEEK MEDICAL CARE IF:  The drainage develops a bad odor.   You have an oral temperature above 102 F (38.9 C).   The amount of drainage from your wound suddenly increases or decreases.   You accidentally pull out your drain.   You have any other questions or concerns.  MAKE SURE YOU:   Understand these instructions.   Will watch your condition.   Will get help right away if you are not doing well or get worse.     Call our office if you have any questions about your drain. [phone]    LAPAROSCOPIC SURGERY:  POST OP INSTRUCTIONS  ######################################################################  EAT Gradually transition to a high fiber diet with a fiber supplement over the next few months after discharge.  Start with a  pureed / full liquid diet (see below)  WALK Walk an hour a day.  Control your pain to do that.    CONTROL PAIN Control pain so that you can walk, sleep, tolerate sneezing/coughing, go up/down stairs.  HAVE A BOWEL MOVEMENT DAILY Keep your bowels regular to avoid problems.  OK to try a laxative to override constipation.  OK to use an antidairrheal to slow down diarrhea.  Call if not better after 2 tries  CALL IF YOU HAVE PROBLEMS/CONCERNS Call if you are still struggling despite following these instructions. Call if you have concerns not answered by these instructions  ######################################################################    DIET: SEE ESOPHAGEAL SURGERY INSTRUCTIONS 1. Take your usually prescribed home medications unless otherwise directed.  2. PAIN CONTROL: a. Pain is best controlled by a usual combination of three different methods TOGETHER: i. Ice/Heat ii. Over the counter pain medication iii. Prescription pain medication b. Most patients will experience some swelling and bruising around the incisions.  Ice packs or heating pads (30-60 minutes up to 6 times a day) will help. Use ice for the first few days to help decrease swelling and bruising, then switch to heat to help relax tight/sore spots and speed recovery.  Some people prefer to use ice alone, heat alone, alternating between ice & heat.  Experiment to what works for you.  Swelling and bruising can take several weeks to resolve.   c. It is helpful to take an over-the-counter pain medication regularly for the first few weeks.  Choose one of the following that works best for you: i. Naproxen (Aleve, etc)  Two 220mg  tabs twice a day ii. Ibuprofen (Advil, etc) Three 200mg  tabs four times a day (every meal & bedtime) iii. Acetaminophen (Tylenol, etc) 500-650mg  four times a day (every meal & bedtime) d. A  prescription for pain medication (such as oxycodone, hydrocodone, tramadol, gabapentin, methocarbamol, etc)  should be given to you upon discharge.  Take your pain medication as prescribed.  i. If you are having problems/concerns with the prescription medicine (does not control pain, nausea, vomiting, rash, itching, etc), please call us [phone] to see if we need to switch you to a different pain medicine that will work better for you and/or control your side effect better. ii. If you need a refill on your pain medication, please give [place] 48 hour notice.  contact your pharmacy.  They will contact our office to request authorization. Prescriptions will not be filled after 5 pm or on week-ends  3. Avoid getting constipated.   a. Between the surgery and the pain medications, it is common to experience some constipation.   b. Increasing fluid intake and taking a fiber supplement (such as Metamucil, Citrucel, FiberCon, MiraLax, etc) 1-2 times a day regularly will usually help prevent this problem from occurring.   c. A mild laxative (prune juice, Milk of Magnesia, MiraLax, etc) should be taken according to package directions if there are no bowel movements after 48 hours.   4. Watch out for diarrhea.   a. If you have many loose bowel movements, simplify your diet to bland foods & liquids for a few days.   b. Stop any stool softeners and decrease your fiber supplement.   c. Switching to mild anti-diarrheal medications (Kayopectate, Pepto Bismol) can help.   d. If this worsens or does not improve, please call us.  5. Wash / shower every day.  You may shower over the dressings as they are waterproof.  Continue to shower over incision(s) after the dressing is off.  6. Remove your waterproof bandages 5 days after surgery.  You may leave the incision open to air.  You may replace a dressing/Band-Aid to cover the incision for comfort if you wish.   7. ACTIVITIES as tolerated:   a. You may resume regular (light) daily activities beginning the next day--such as daily self-care, walking, climbing stairs--gradually  increasing activities as tolerated.  If you can walk 30 minutes without difficulty, it is safe to try more intense activity such as jogging, treadmill, bicycling, low-impact aerobics, swimming, etc. b. Save the most intensive and strenuous activity for last such as sit-ups, heavy lifting, contact sports, etc  Refrain from any heavy lifting or straining until you are off narcotics for pain control.   c. DO NOT PUSH THROUGH PAIN.  Let pain be your guide: If it hurts to do something, don't do it.  Pain is your body warning you to avoid that activity for another week until the pain goes down. d. You may drive when you are no longer taking prescription pain medication, you can comfortably wear a seatbelt, and you can safely maneuver your car and apply brakes. e. Dennis Bast may have sexual intercourse when it is comfortable.  8. FOLLOW UP in our office a. Please call [facility] at [phone] to set up an appointment to see your surgeon in the office for a follow-up appointment approximately 2-3 weeks after your surgery. b. Make sure that you call for this appointment the day you arrive home to insure a convenient appointment time.  10. IF YOU HAVE DISABILITY OR FAMILY LEAVE FORMS, BRING THEM TO THE OFFICE FOR PROCESSING.  DO NOT GIVE THEM TO YOUR DOCTOR.   WHEN TO CALL us [phone]: 1. Poor pain control 2. Reactions / problems with new medications (rash/itching, nausea, etc)  3. Fever over 101.5 F (38.5 C) 4. Inability to urinate 5. Nausea and/or vomiting 6. Worsening swelling or bruising 7. Continued bleeding from incision. 8. Increased pain, redness, or drainage from the incision   The clinic staff is available to answer your questions during regular business hours (8:30am-5pm).  Please dont hesitate to call and ask to speak to one of our nurses for clinical concerns.   If you have a medical emergency, go to the nearest emergency room or call 911.  A surgeon from  [facility] is always  on call at the [facility], [place], [place], [place], [place]  [address] ? MAIN: [phone] ? TOLL FREE: [phone] ?  FAX [phone] [email or web address]   Atrial Fibrillation Atrial fibrillation is a type of irregular or rapid heartbeat (arrhythmia). In atrial fibrillation, the top part of the heart (atria) quivers in a chaotic pattern. This makes the heart unable to pump blood normally. Having atrial fibrillation can increase your risk for other health problems, such as:  Blood can pool in the atria and form clots. If a clot travels to the brain, it can cause a stroke.  The heart muscle may weaken from the irregular blood flow. This can cause heart failure. Atrial fibrillation may start suddenly and stop on its own, or it may become a long-lasting problem. What are the causes? This condition is caused by some heart-related conditions or procedures, including:  High blood pressure. This is the most common cause.  Heart failure.  Heart valve conditions.  Inflammation of the sac that surrounds the heart (pericarditis).  Heart surgery.  Coronary artery disease.  Certain heart rhythm disorders, such as Wolf-Parkinson-White syndrome. Other causes include:  Pneumonia.  Obstructive sleep apnea.  Lung cancer.  Thyroid problems, especially if the thyroid is overactive (hyperthyroidism).  Excessive alcohol or drug use. Sometimes, the cause of this condition is not known. What increases the risk? This condition is more likely to develop in:  Older people.  People who smoke.  People who have diabetes mellitus.  People who are overweight (obese).  Athletes who exercise vigorously.  People who have a family history. What are the signs or symptoms? Symptoms of this condition include:  A feeling that your heart is beating rapidly or irregularly.  A feeling of discomfort or pain in your  chest.  Shortness of breath.  Sudden light-headedness or weakness.  Getting tired easily during exercise. In some cases, there are no symptoms. How is this diagnosed? Your health care provider may be able to detect atrial fibrillation when taking your pulse. If detected, this condition may be diagnosed with:  Electrocardiogram (ECG).  Ambulatory cardiac monitor. This device records your heartbeats for 24 hours or more.  Transthoracic echocardiogram (TTE) to evaluate how blood flows through your heart.  Transesophageal echocardiogram (TEE) to view more detailed images of your heart.  A stress test.  Imaging tests, such as a CT scan or chest X-Christia.  Blood tests. How is this treated? This condition may be treated with:  Medicines to slow down the heart rate or bring the heart's rhythm back to normal.  Medicines to prevent blood clots from forming.  Electrical cardioversion. This delivers a low-energy shock to the heart to reset its rhythm.  Ablation. This procedure destroys the part of the heart tissue that sends abnormal signals.  Left atrial appendage occlusion/excision. This seals off a common place in the atria where blood clots can form (left atrial appendage). The goal of treatment is to prevent blood clots from forming and to keep your heart beating at a normal rate and rhythm. Treatment depends on underlying medical conditions and how you feel when you are experiencing fibrillation. Follow these instructions at home: Medicines  Take over-the counter and prescription medicines only as told by your health care provider.  If your health care provider prescribed a blood-thinning medicine (anticoagulant), take it exactly as told. Taking too much blood-thinning medicine can cause bleeding. Taking too little can  enable a blood clot to form and travel to the brain, causing a stroke. Lifestyle      Do not use any products that contain nicotine or tobacco, such as  cigarettes and e-cigarettes. If you need help quitting, ask your health care provider.  Do not drink beverages that contain caffeine, such as coffee, soda, and tea.  Follow diet instructions as told by your health care provider.  Exercise regularly as told by your health care provider.  Do not drink alcohol. General instructions  If you have obstructive sleep apnea, manage your condition as told by your health care provider.  Maintain a healthy weight. Do not use diet pills unless your health care provider approves. Diet pills may make heart problems worse.  Keep all follow-up visits as told by your health care provider. This is important. Contact a health care provider if you:  Notice a change in the rate, rhythm, or strength of your heartbeat.  Are taking an anticoagulant and you notice increased bruising.  Tire more easily when you exercise or exert yourself.  Have a sudden change in weight. Get help right away if you have:   Chest pain, abdominal pain, sweating, or weakness.  Difficulty breathing.  Blood in your vomit, stool (feces), or urine.  Any symptoms of a stroke. "BE FAST" is an easy way to remember the main warning signs of a stroke: ? B - Balance. Signs are dizziness, sudden trouble walking, or loss of balance. ? E - Eyes. Signs are trouble seeing or a sudden change in vision. ? F - Face. Signs are sudden weakness or numbness of the face, or the face or eyelid drooping on one side. ? A - Arms. Signs are weakness or numbness in an arm. This happens suddenly and usually on one side of the body. ? S - Speech. Signs are sudden trouble speaking, slurred speech, or trouble understanding what people say. ? T - Time. Time to call emergency services. Write down what time symptoms started.  Other signs of a stroke, such as: ? A sudden, severe headache with no known cause. ? Nausea or vomiting. ? Seizure. These symptoms may represent a serious problem that is an  emergency. Do not wait to see if the symptoms will go away. Get medical help right away. Call your local emergency services (911 in the [place] Do not drive yourself to the hospital. Summary  Atrial fibrillation is a type of irregular or rapid heartbeat (arrhythmia).  Symptoms include a feeling that your heart is beating fast or irregularly. In some cases, you may not have symptoms.  The condition is treated with medicines to slow down the heart rate or bring the heart's rhythm back to normal. You may also need blood-thinning medicines to prevent blood clots.  Get help right away if you have symptoms or signs of a stroke. This information is not intended to replace advice given to you by your health care provider. Make sure you discuss any questions you have with your health care provider. Document Released: 03/18/2005 Document Revised: 05/09/2017 Document Reviewed: 05/09/2017 Elsevier Interactive Patient Education  2019 [place] on my medicine - ELIQUIS (apixaban)  This medication education was reviewed with me or my healthcare representative as part of my discharge preparation.  The pharmacist that spoke with me during my hospital stay was:  Georgina Peer, [facility]  Why was Eliquis prescribed for you? Eliquis was prescribed for you to reduce the risk of a blood  clot forming that can cause a stroke if you have a medical condition called atrial fibrillation (a type of irregular heartbeat).  What do You need to know about Eliquis ? Take your Eliquis TWICE DAILY - one tablet in the morning and one tablet in the evening with or without food. If you have difficulty swallowing the tablet whole please discuss with your pharmacist how to take the medication safely.  Take Eliquis exactly as prescribed by your doctor and DO NOT stop taking Eliquis without talking to the doctor who prescribed the medication.  Stopping may increase your risk of developing a stroke.  Refill  your prescription before you run out.  After discharge, you should have regular check-up appointments with your healthcare provider that is prescribing your Eliquis.  In the future your dose may need to be changed if your kidney function or weight changes by a significant amount or as you get older.  What do you do if you miss a dose? If you miss a dose, take it as soon as you remember on the same day and resume taking twice daily.  Do not take more than one dose of ELIQUIS at the same time to make up a missed dose.  Important Safety Information A possible side effect of Eliquis is bleeding. You should call your healthcare provider right away if you experience any of the following: ? Bleeding from an injury or your nose that does not stop. ? Unusual colored urine (red or dark brown) or unusual colored stools (red or black). ? Unusual bruising for unknown reasons. ? A serious fall or if you hit your head (even if there is no bleeding).  Some medicines may interact with Eliquis and might increase your risk of bleeding or clotting while on Eliquis. To help avoid this, consult your healthcare provider or pharmacist prior to using any new prescription or non-prescription medications, including herbals, vitamins, non-steroidal anti-inflammatory drugs (NSAIDs) and supplements.  This website has more information on Eliquis (apixaban): http://www.eliquis.com/eliquis/home

## 2018-06-08 NOTE — Evaluation (Signed)
Occupational Therapy Evaluation Patient Details Name: [name] MRN: [id number] DOB: 1934-12-01 Today's Date: 06/08/2018    History of Present Illness Pt is an 84 yo female s/p laparoscopic repair of incarcerated hiatal hernia with lysis of adhesions.  Pt with hx of PAF, CAD, colon CA s/p colostomy, CHF and macular degeneration   Clinical Impression   PTA Pt independent in ADL and mobility. Pt is currently min guard/min A for line management - does not require physical assist for transfers or ADL. Able to perform LB dressing sitting in recliner, standing grooming tasks at sink this session at min guard. Pt will benefit from skilled OT in the acute setting to maximize safety and independence in ADL and functional transfers and anticipate that Pt will progress quickly and not require post-acute OT. Next session to continue to focus on transfers, LB ADL, and activity tolerance and sink.    Follow Up Recommendations  No OT follow up    Equipment Recommendations  None recommended by OT(Pt has appropriate DME)    Recommendations for Other Services       Precautions / Restrictions Precautions Precautions: Fall Precaution Comments: colostomy bag, JP drain Restrictions Weight Bearing Restrictions: No      Mobility Bed Mobility               General bed mobility comments: received in bedside recliner  Transfers Overall transfer level: Needs assistance Equipment used: None Transfers: Sit to/from Stand Sit to Stand: Min guard         General transfer comment: Min guard for safety, no physical assist required.     Balance Overall balance assessment: Needs assistance Sitting-balance support: Feet supported;No upper extremity supported Sitting balance-Leahy Scale: Good     Standing balance support: No upper extremity supported Standing balance-Leahy Scale: Fair                             ADL either performed or assessed with clinical judgement   ADL  Overall ADL's : Needs assistance/impaired Eating/Feeding: Modified independent;Sitting   Grooming: Supervision/safety;Standing;Oral care;Wash/dry face;Wash/dry hands Grooming Details (indicate cue type and reason): sink level, supervision overall- but required assist for lines Upper Body Bathing: Set up;Sitting   Lower Body Bathing: Set up;Sitting/lateral leans   Upper Body Dressing : Set up;Sitting   Lower Body Dressing: Set up;Sit to/from stand   Toilet Transfer: Min guard;Ambulation Toilet Transfer Details (indicate cue type and reason): assist for lines Toileting- Clothing Manipulation and Hygiene: Min guard;Sit to/from stand       Functional mobility during ADLs: Min guard       Vision Baseline Vision/History: Wears glasses Wears Glasses: At all times Patient Visual Report: No change from baseline Vision Assessment?: No apparent visual deficits     Perception     Praxis      Pertinent Vitals/Pain Pain Assessment: No/denies pain     Hand Dominance Right   Extremity/Trunk Assessment Upper Extremity Assessment Upper Extremity Assessment: Overall WFL for tasks assessed   Lower Extremity Assessment Lower Extremity Assessment: Defer to PT evaluation   Cervical / Trunk Assessment Cervical / Trunk Assessment: Kyphotic   Communication Communication Communication: No difficulties   Cognition Arousal/Alertness: Awake/alert Behavior During Therapy: WFL for tasks assessed/performed Overall Cognitive Status: Within Functional Limits for tasks assessed  General Comments  HR fluctated between 110-130 throughout session    Exercises     Shoulder Instructions      Home Living Family/patient expects to be discharged to:: Private residence Living Arrangements: Alone Available Help at Discharge: Family;Friend(s) Type of Home: Apartment Home Access: Level entry     Home Layout: One level     Bathroom  Shower/Tub: Teacher, early years/pre: Standard Bathroom Accessibility: Yes How Accessible: Accessible via walker Home Equipment: [place] - 2 wheels;Walker - 4 wheels;Cane - single point;Bedside commode;Shower seat          Prior Functioning/Environment Level of Independence: Independent        Comments: enjoys pickleball        OT Problem List: Decreased activity tolerance;Impaired balance (sitting and/or standing);Cardiopulmonary status limiting activity      OT Treatment/Interventions: Self-care/ADL training;Energy conservation;DME and/or AE instruction;Therapeutic activities;Patient/family education;Balance training    OT Goals(Current goals can be found in the care plan section) Acute Rehab OT Goals Patient Stated Goal: get back to pickle ball OT Goal Formulation: With patient Time For Goal Achievement: 06/22/18 Potential to Achieve Goals: Good ADL Goals Pt Will Perform Grooming: with modified independence;standing Pt Will Perform Upper Body Dressing: with modified independence;sitting Pt Will Perform Lower Body Dressing: with modified independence;sit to/from stand Pt Will Transfer to Toilet: ambulating;with modified independence Pt Will Perform Toileting - Clothing Manipulation and hygiene: with modified independence;sit to/from stand  OT Frequency: Min 2X/week   Barriers to D/C:    Pt lives alone - will watch for necessary progress - anticipate no post-acute follow up       Co-evaluation              AM-PAC OT "6 Clicks" Daily Activity     Outcome Measure Help from another person eating meals?: None Help from another person taking care of personal grooming?: A Little Help from another person toileting, which includes using toliet, bedpan, or urinal?: A Little Help from another person bathing (including washing, rinsing, drying)?: A Little Help from another person to put on and taking off regular upper body clothing?: A Little Help from another  person to put on and taking off regular lower body clothing?: A Little 6 Click Score: 19   End of Session Equipment Utilized During Treatment: Gait belt Nurse Communication: Mobility status;Other (comment)(HR 110-130 throughout session; O2 stable on RA)  Activity Tolerance: Patient tolerated treatment well Patient left: Other (comment)(walking unit with PT)  OT Visit Diagnosis: Unsteadiness on feet (R26.81);Muscle weakness (generalized) (M62.81)                Time: 1610-9604 OT Time Calculation (min): 13 min Charges:  OT General Charges $OT Visit: 1 Visit OT Evaluation $OT Eval Moderate Complexity: [place] OTR/L Acute Rehabilitation Services Pager: [phone] Office: [place] 06/08/2018, 1:10 PM

## 2018-06-09 LAB — BASIC METABOLIC PANEL
Anion gap: 9 (ref 5–15)
BUN: 11 mg/dL (ref 8–23)
CO2: 24 mmol/L (ref 22–32)
Calcium: 8 mg/dL — ABNORMAL LOW (ref 8.9–10.3)
Chloride: 101 mmol/L (ref 98–111)
Creatinine, Ser: 1.2 mg/dL — ABNORMAL HIGH (ref 0.44–1.00)
GFR calc Af Amer: 49 mL/min — ABNORMAL LOW (ref >60)
GFR calc non Af Amer: 42 mL/min — ABNORMAL LOW (ref >60)
Glucose, Bld: 120 mg/dL — ABNORMAL HIGH (ref 70–99)
Potassium: 4.2 mmol/L (ref 3.5–5.1)
Sodium: 134 mmol/L — ABNORMAL LOW (ref 135–145)

## 2018-06-09 LAB — CBC
HCT: 32.8 % — ABNORMAL LOW (ref 36.0–46.0)
Hemoglobin: 10.7 g/dL — ABNORMAL LOW (ref 12.0–15.0)
MCH: 27.4 pg (ref 26.0–34.0)
MCHC: 32.6 g/dL (ref 30.0–36.0)
MCV: 83.9 fL (ref 80.0–100.0)
NRBC: 0 % (ref 0.0–0.2)
Platelets: 198 10*3/uL (ref 150–400)
RBC: 3.91 MIL/uL (ref 3.87–5.11)
RDW: 15.5 % (ref 11.5–15.5)
WBC: 10.8 10*3/uL — ABNORMAL HIGH (ref 4.0–10.5)

## 2018-06-09 MED ORDER — VITAMIN D (ERGOCALCIFEROL) 1.25 MG (50000 UNIT) PO CAPS
50000.0000 [IU] | ORAL_CAPSULE | ORAL | Status: DC
  Administered 2018-06-09: 50000 [IU] via ORAL
  Filled 2018-06-09: qty 1

## 2018-06-09 MED ORDER — FERROUS SULFATE 325 (65 FE) MG PO TABS
325.0000 mg | ORAL_TABLET | Freq: Every day | ORAL | Status: DC
  Administered 2018-06-09 – 2018-06-11 (×3): 325 mg via ORAL
  Filled 2018-06-09 (×3): qty 1

## 2018-06-09 MED ORDER — ROSUVASTATIN CALCIUM 5 MG PO TABS
10.0000 mg | ORAL_TABLET | Freq: Every day | ORAL | Status: DC
  Administered 2018-06-09 – 2018-06-11 (×3): 10 mg via ORAL
  Filled 2018-06-09 (×3): qty 2

## 2018-06-09 MED ORDER — MAGIC MOUTHWASH
15.0000 mL | Freq: Three times a day (TID) | ORAL | Status: AC
  Administered 2018-06-09 (×2): 15 mL via ORAL
  Filled 2018-06-09 (×2): qty 15

## 2018-06-09 MED ORDER — GABAPENTIN 100 MG PO CAPS
200.0000 mg | ORAL_CAPSULE | Freq: Three times a day (TID) | ORAL | Status: DC
  Administered 2018-06-09 – 2018-06-11 (×7): 200 mg via ORAL
  Filled 2018-06-09 (×7): qty 2

## 2018-06-09 NOTE — Progress Notes (Addendum)
[name] [id number] 1934-10-26  CARE TEAM:  PCP: Terald Sleeper, PA-C  Outpatient Care Team: Patient Care Team: [name] as PCP - General (General Practice) Okey Regal, [place] (Optometry) Herminio Commons, MD as Attending Physician (Cardiology) Truitt Merle, MD as Consulting Physician (Hematology) Leighton Ruff, MD as Consulting Physician (Colon and Rectal Surgery) Michael Boston, MD as Consulting Physician (General Surgery) Pyrtle, Lajuan Lines, MD as Consulting Physician (Gastroenterology) Fran Lowes, MD as Consulting Physician (Nephrology)  Inpatient Treatment Team: Treatment Team: Attending Provider: Michael Boston, MD; Technician: Raynelle Highland, NT; Consulting Physician: Lbcardiology, Michae Kava, MD; Registered Nurse: Chriss Driver, RN; Registered Nurse: France Ravens, RN; Registered Nurse: Oswaldo Conroy, RN; Registered Nurse: Gennie Alma, RN   Problem List:   Principal Problem:   Incarcerated recurrent hiatal hernia s/p robotic repair 06/05/2018 Active Problems:   CKD (chronic kidney disease) stage 3, GFR 30-59 ml/min (HCC)   Anemia due to chronic blood loss   Essential hypertension   Gastric volvulus   Irritable bowel syndrome with constipation   GERD (gastroesophageal reflux disease) s/p Toupet fundoplication (partial posterior wrap)   S/P robotic partial posterior (Toupet) fundoplication 3/0/1601   Pulmonary hypertension ([place])   Hypothyroidism   History of [name]'s esophagus   Anxiety state   4 Days Post-Op  06/05/2018  POST-OPERATIVE DIAGNOSIS:  Incarcerated, recurrent Hiatal Hernia with organoaxial volvulus  PROCEDURE:   XI ROBOTIC REPAIR PARASOPHAGEAL HIATAL HERNIA ABSORBABLE MESH REINFORCEMENT TOUPET FUNDOPLICATION LYSIS OF ADHESIONS X 3 HOURS EGD  SURGEON:  Adin Hector, MD   Assessment  Recovering  [facility] Stay = 4 days)  Plan:  -Speech therapy consultation to double check that there is no other dysphasia  issues.  She feeds reporting more and oropharyngeal.  Put on clear liquids.  Advance to dysphagia 1 as tolerated.  She did not seem to have problems until the past 24 hours.  Somewhat odd.  Does not look toxic.  Does not seem to be aspirating.  Just double check.  Dys1 diet as tolerated.  Stay on that for ~2 weeks until wrap edema resolves    -defer cardiac workup for postop Afib & elevated troponin to Cardiology - help appreciated.   Transitioning to amiodarone drip.  Hopefully convert to oral.  D/w Dr Gwenlyn Found - he will see.  -ICU / SDU / telemetry per cardiology  -anticoagulation as needed  -continue mediastinal drain  -hypothyroidism - synthroid  -Mildly elevated creatinine most likely reflection of her chronic kidney disease.  Not particularly abnormal.  Agree with trying to keep her on the dry side from a cardiopulmonary standpoint which is pretty standard even for hiatal hernias, let alone in the setting of A. fib with RVR, etc.  -hyperchol - Crestor  -VTE prophylaxis- SCDs, etc  -mobilize as tolerated to help recovery.  Cleared by occupational physical therapy.  Walking well with nursing.  That is encouraging.  Most likely can transition home and avoid skilled nursing facility.  We will see.  45 minutes spent in review, evaluation, examination, counseling, and coordination of care.  More than 50% of that time was spent in counseling.  06/09/2018    Subjective: (Chief complaint)  Sitting in chair.  Nursing in room.  Cleared by physical and Occupational Therapy.  Feels like everything is sticking when she swallows this morning for some reason.  Switched to IV amiodarone.  Heart rate somewhat irregular but blood pressure more stable.  Denies pain or shortness of breath.  Objective:  Vital signs:  Vitals:   06/09/18 0444 06/09/18 0500 06/09/18 0600 06/09/18 0700  BP:  (!) 142/72 (!) 141/123 134/84  Pulse: 73 70 (!) 37 (!) 108  Resp: 19 14 (!) 23 20  Temp:        TempSrc:      SpO2: 99% 97% 99% 97%  Weight:   72.6 kg   Height:        Last BM Date: 06/07/18  Intake/Output   Yesterday:  03/09 0701 - 03/10 0700 In: 977.3 [P.O.:480; I.V.:497.3] Out: 2175 [Urine:1500; Drains:425; Stool:250] This shift:  No intake/output data recorded.  Bowel function:  Flatus: YES  BM:  YES  Drain: Serous   Physical Exam:  General: Pt awake/alert/oriented x4 in no acute distress.  Smiling - gives me warm handshake & big smile Eyes: PERRL, normal EOM.  Sclera clear.  No icterus Neuro: CN II-XII intact w/o focal sensory/motor deficits. Lymph: No head/neck/groin lymphadenopathy Psych:  No delerium/psychosis/paranoia HENT: Normocephalic, Mucus membranes moist.  No thrush Neck: Supple, No tracheal deviation Chest: No chest wall pain w good excursion CV:  Pulses intact.  Regular rhythm MS: Normal AROM mjr joints.  No obvious deformity  Abdomen: Soft.  Nondistended.  Nontender.  No evidence of peritonitis.  No incarcerated hernias.  Ext:  No deformity.  No mjr edema.  No cyanosis Skin: No petechiae / purpura  Results:   Patient Name:   [name] Date of Exam: 06/07/2018 Medical Rec #:  [id number]  Height:       62.5 in Accession #:    [id number] Weight:       165.3 lb Date of Birth:  06-Jan-1935  BSA:          1.77 m Patient Age:    84 years   BP:           129/76 mmHg Patient Gender: F          HR:           68 bpm. Exam Location:  Inpatient    Procedure: 2D Echo  Indications:    Atrial Fibrillation 427.31   History:        Patient has prior history of Echocardiogram examinations, most                 recent 05/29/2017. CHF, CAD, Pulmonary HTN; Risk Factors:                 Hypertension.   Sonographer:    Mikki [place] RDCS (AE) Referring Phys: [place]    1. The left ventricle has normal systolic function with an ejection fraction of 60-65%. The cavity size was normal. Left ventricular diastolic Doppler  parameters are indeterminate in the setting of atrial fibrillation. No obvious wall motion  abnormalities.  2. The right ventricle has normal systolic function. The cavity was normal. There is no increase in right ventricular wall thickness. Right ventricular systolic pressure upper normal with an estimated pressure of 34.6 mmHg.  3. Left atrial size was moderately dilated.  4. The aortic valve is tricuspid. Mild calcification of the aortic valve. Moderate aortic annular calcification noted.  5. The mitral valve is normal in structure. Mitral valve regurgitation is mild to moderate by color flow Doppler.  6. The tricuspid valve is normal in structure.  7. The aortic root is normal in size and structure.  FINDINGS  Left Ventricle: The left ventricle has normal systolic function, with an ejection fraction of  60-65%. The cavity size was normal. There is no increase in left ventricular wall thickness. Left ventricular diastolic Doppler parameters are indeterminate Right Ventricle: The right ventricle has normal systolic function. The cavity was normal. There is no increase in right ventricular wall thickness. Right ventricular systolic pressure upper normal with an estimated pressure of 34.6 mmHg. Left Atrium: left atrial size was moderately dilated Right Atrium: right atrial size was mild-moderately dilated. Right atrial pressure is estimated at 3 mmHg. Interatrial Septum: No atrial level shunt detected by color flow Doppler. Pericardium: There is no evidence of pericardial effusion. There is a pericardial fat pad noted. Mitral Valve: The mitral valve is normal in structure. Mitral valve regurgitation is mild to moderate by color flow Doppler. Tricuspid Valve: The tricuspid valve is normal in structure. Tricuspid valve regurgitation is mild by color flow Doppler. Aortic Valve: The aortic valve is tricuspid Mild calcification of the aortic valve. Aortic valve regurgitation was not visualized by color  flow Doppler. Moderate aortic annular calcification noted. Pulmonic Valve: The pulmonic valve was grossly normal. Pulmonic valve regurgitation is trivial by color flow Doppler. Aorta: The aortic root is normal in size and structure.   LEFT VENTRICLE PLAX 2D (Teich) LV EF:          54.4 % LVIDd:          4.30 cm LVIDs:          3.10 cm LV PW:          0.90 cm LV IVS:         0.90 cm LVOT diam:      2.20 cm LV SV:          45 ml LVOT Area:      3.80 cm  RIGHT VENTRICLE RVSP:           34.6 mmHg  LEFT ATRIUM              Index       RIGHT ATRIUM           Index LA diam:        4.40 cm  2.48 cm/m  RA Pressure: 3 mmHg LA Vol (A2C):   56.6 ml  31.91 ml/m RA Area:     21.40 cm LA Vol (A4C):   115.0 ml 64.84 ml/m RA Volume:   65.10 ml  36.70 ml/m LA Biplane Vol: 80.6 ml  45.44 ml/m  AORTIC VALVE LVOT Vmax:   84.40 cm/s LVOT Vmean:  57.000 cm/s LVOT VTI:    0.133 m   AORTA Ao Root diam: 3.00 cm  MR Peak grad: 113.2 mmHg  TRICUSPID VALVE MR Vmax:      532.00 cm/s TR Peak grad:   31.6 mmHg                           TR Vmax:        281.00 cm/s                           RVSP:           34.6 mmHg    Rozann Lesches MD Electronically signed by Rozann Lesches MD Signature Date/Time: 06/07/2018/11:26:23 AM     Labs: Results for orders placed or performed during the hospital encounter of 06/05/18 (from the past 48 hour(s))  CBC     Status: Abnormal   Collection Time: 06/08/18  9:13 AM  Result Value Ref Range   WBC 12.2 (H) 4.0 - 10.5 K/uL   RBC 4.21 3.87 - 5.11 MIL/uL   Hemoglobin 11.0 (L) 12.0 - 15.0 g/dL   HCT 35.1 (L) 36.0 - 46.0 %   MCV 83.4 80.0 - 100.0 fL   MCH 26.1 26.0 - 34.0 pg   MCHC 31.3 30.0 - 36.0 g/dL   RDW 15.2 11.5 - 15.5 %   Platelets 177 150 - 400 K/uL   nRBC 0.0 0.0 - 0.2 %    Comment: Performed at [facility], [place] [address] [place], [place] [address]  Troponin I - Now Then Q6H     Status: Abnormal   Collection Time: 06/08/18  9:13 AM    Result Value Ref Range   Troponin I 5.01 (HH) <0.03 ng/mL    Comment: CRITICAL RESULT CALLED TO, READ BACK BY AND VERIFIED WITH: KIMBLE,M RN @ 3762 06/08/18 LEONARD,A Performed at [facility], [place] [address], [place], [place] [address]   Basic metabolic panel     Status: Abnormal   Collection Time: 06/08/18  9:13 AM  Result Value Ref Range   Sodium 133 (L) 135 - 145 mmol/L   Potassium 3.3 (L) 3.5 - 5.1 mmol/L   Chloride 100 98 - 111 mmol/L   CO2 26 22 - 32 mmol/L   Glucose, Bld 137 (H) 70 - 99 mg/dL   BUN 12 8 - 23 mg/dL   Creatinine, Ser 1.22 (H) 0.44 - 1.00 mg/dL   Calcium 7.8 (L) 8.9 - 10.3 mg/dL   GFR calc non Af Amer 42 (L) >60 mL/min   GFR calc Af Amer 48 (L) >60 mL/min   Anion gap 7 5 - 15    Comment: Performed at [place] [address], [place], [place] [address]  TSH     Status: None   Collection Time: 06/08/18  9:13 AM  Result Value Ref Range   TSH 1.950 0.350 - 4.500 uIU/mL    Comment: Performed by a 3rd Generation assay with a functional sensitivity of <=0.01 uIU/mL. Performed at [facility], [place] [address] [place], [place] [address]   Troponin I - Now Then Q6H     Status: Abnormal   Collection Time: 06/08/18  2:12 PM  Result Value Ref Range   Troponin I 4.34 (HH) <0.03 ng/mL    Comment: CRITICAL VALUE NOTED.  VALUE IS CONSISTENT WITH PREVIOUSLY REPORTED AND CALLED VALUE. Performed at [facility], [place] [address] [place],  [address]   Troponin I - Now Then Q6H     Status: Abnormal   Collection Time: 06/08/18  9:02 PM  Result Value Ref Range   Troponin I 4.39 (HH) <0.03 ng/mL    Comment: CRITICAL VALUE NOTED.  VALUE IS CONSISTENT WITH PREVIOUSLY REPORTED AND CALLED VALUE. Performed at [facility], [place] [address], [place],  [address]   Basic metabolic panel     Status: Abnormal   Collection Time: 06/09/18  3:24 AM  Result Value Ref Range   Sodium 134 (L) 135 - 145 mmol/L   Potassium 4.2 3.5 - 5.1 mmol/L    Chloride 101 98 - 111 mmol/L   CO2 24 22 - 32 mmol/L   Glucose, Bld 120 (H) 70 - 99 mg/dL   BUN 11 8 - 23 mg/dL   Creatinine, Ser 1.20 (H) 0.44 - 1.00 mg/dL   Calcium 8.0 (L) 8.9 - 10.3 mg/dL  GFR calc non Af Amer 42 (L) >60 mL/min   GFR calc Af Amer 49 (L) >60 mL/min   Anion gap 9 5 - 15    Comment: Performed at [place] [address], [place], [place] [address]  CBC     Status: Abnormal   Collection Time: 06/09/18  3:24 AM  Result Value Ref Range   WBC 10.8 (H) 4.0 - 10.5 K/uL   RBC 3.91 3.87 - 5.11 MIL/uL   Hemoglobin 10.7 (L) 12.0 - 15.0 g/dL   HCT 32.8 (L) 36.0 - 46.0 %   MCV 83.9 80.0 - 100.0 fL   MCH 27.4 26.0 - 34.0 pg   MCHC 32.6 30.0 - 36.0 g/dL   RDW 15.5 11.5 - 15.5 %   Platelets 198 150 - 400 K/uL   nRBC 0.0 0.0 - 0.2 %    Comment: Performed at [facility], [place] [address], [place], [place] [address]    Imaging / Studies: No results found.  Medications / Allergies: per chart  Antibiotics: Anti-infectives (From admission, onward)   Start     Dose/Rate Route Frequency Ordered Stop   06/05/18 1700  metroNIDAZOLE (FLAGYL) IVPB 500 mg     500 mg 100 mL/hr over 60 Minutes Intravenous Every 8 hours 06/05/18 1621 06/05/18 1813   06/05/18 0600  cefTRIAXone (ROCEPHIN) 2 g in sodium chloride 0.9 % 100 mL IVPB     2 g 200 mL/hr over 30 Minutes Intravenous On call to O.R. 06/05/18 0541 06/05/18 1626   06/05/18 0600  metroNIDAZOLE (FLAGYL) IVPB 500 mg     500 mg 100 mL/hr over 60 Minutes Intravenous On call to O.R. 06/05/18 0541 06/05/18 0849        Note: Portions of this report may have been transcribed using voice recognition software. Every effort was made to ensure accuracy; however, inadvertent computerized transcription errors may be present.   Any transcriptional errors that result from this process are unintentional.     Adin Hector, MD, FACS, MASCRS Gastrointestinal and Minimally Invasive Surgery    [address] [place], [place] [address] [phone] Main / Paging [phone] Fax

## 2018-06-09 NOTE — Progress Notes (Signed)
Progress Note  Patient Name: [name] Date of Encounter: 06/09/2018  Primary Cardiologist: Dr. Bronson Ing  Subjective   Postop day 3 robotic hiatal hernia repair by Dr. Johney Maine.  Apparently this was a fairly long complicated operation.  The patient does have a history of PAF in the past as well as CAD status post LAD stenting in 2013 by Dr. Velva Harman.  She went into A. fib with RVR.  Her troponins rose to 10 although she is completely asymptomatic.  She was started on Eliquis yesterday and IV amiodarone.  She remains in A. fib with controlled ventricular response.  Inpatient Medications    Scheduled Meds: . acetaminophen  1,000 mg Oral Q8H  . apixaban  5 mg Oral BID  . aspirin EC  81 mg Oral Daily  . ferrous sulfate  325 mg Oral Q breakfast  . fluticasone  2 spray Each Nare Daily  . gabapentin  200 mg Oral TID  . isosorbide mononitrate  30 mg Oral q morning - 10a  . levothyroxine  50 mcg Oral QAC breakfast  . lip balm  1 application Topical BID  . loratadine  10 mg Oral QHS  . magic mouthwash  15 mL Oral TID  . mouth rinse  15 mL Mouth Rinse BID  . metoprolol succinate  12.5 mg Oral Daily  . montelukast  10 mg Oral QHS  . rosuvastatin  10 mg Oral Daily  . Vitamin D (Ergocalciferol)  50,000 Units Oral Q Tue   Continuous Infusions: . amiodarone 30 mg/hr (06/09/18 0600)   PRN Meds: albuterol, ALPRAZolam, alum & mag hydroxide-simeth, bisacodyl, diphenhydrAMINE **OR** diphenhydrAMINE, enalaprilat, guaiFENesin-dextromethorphan, hydrALAZINE, hydrocortisone, hydrocortisone cream, HYDROmorphone (DILAUDID) injection, meclizine, menthol-cetylpyridinium, methocarbamol, metoprolol tartrate, nitroGLYCERIN, nystatin, ondansetron **OR** ondansetron (ZOFRAN) IV, phenol, polyethylene glycol, polyvinyl alcohol, prochlorperazine **OR** prochlorperazine, simethicone, zolpidem   Vital Signs    Vitals:   06/09/18 0444 06/09/18 0500 06/09/18 0600 06/09/18 0700  BP:  (!) 142/72 (!) 141/123 134/84    Pulse: 73 70 (!) 37 (!) 108  Resp: 19 14 (!) 23 20  Temp:      TempSrc:      SpO2: 99% 97% 99% 97%  Weight:   72.6 kg   Height:        Intake/Output Summary (Last 24 hours) at 06/09/2018 0816 Last data filed at 06/09/2018 0600 Gross per 24 hour  Intake 737.33 ml  Output 1855 ml  Net -1117.67 ml   Last 3 Weights 06/09/2018 06/08/2018 06/07/2018  Weight (lbs) 160 lb 0.9 oz 168 lb 10.4 oz 165 lb 5.5 oz  Weight (kg) 72.6 kg 76.5 kg 75 kg      Telemetry    Atrial fibrillation with rapid ventricular response- Personally Reviewed  ECG    Atrial fibrillation 97- Personally Reviewed  Physical Exam   GEN: No acute distress.   Neck: No JVD Cardiac: RRR, no murmurs, rubs, or gallops.  Respiratory: Clear to auscultation bilaterally. GI: Soft, nontender, non-distended  MS: No edema; No deformity. Neuro:  Nonfocal  Psych: Normal affect   Labs    Chemistry Recent Labs  Lab 06/06/18 2339 06/08/18 0913 06/09/18 0324  NA 134* 133* 134*  K 3.7 3.3* 4.2  CL 105 100 101  CO2 22 26 24   GLUCOSE 118* 137* 120*  BUN 15 12 11   CREATININE 1.17* 1.22* 1.20*  CALCIUM 7.6* 7.8* 8.0*  GFRNONAA 44* 42* 42*  GFRAA 51* 48* 49*  ANIONGAP 7 7 9      Hematology Recent  Labs  Lab 06/08/18 0913 06/09/18 0324  WBC 12.2* 10.8*  RBC 4.21 3.91  HGB 11.0* 10.7*  HCT 35.1* 32.8*  MCV 83.4 83.9  MCH 26.1 27.4  MCHC 31.3 32.6  RDW 15.2 15.5  PLT 177 198    Cardiac Enzymes Recent Labs  Lab 06/06/18 2339 06/08/18 0913 06/08/18 1412 06/08/18 2102  TROPONINI 10.58* 5.01* 4.34* 4.39*   No results for input(s): TROPIPOC in the last 168 hours.   BNPNo results for input(s): BNP, PROBNP in the last 168 hours.   DDimer No results for input(s): DDIMER in the last 168 hours.   Radiology    No results found.  Cardiac Studies   2D echocardiogram (06/07/2018)  IMPRESSIONS    1. The left ventricle has normal systolic function with an ejection fraction of 60-65%. The cavity size was  normal. Left ventricular diastolic Doppler parameters are indeterminate in the setting of atrial fibrillation. No obvious wall motion  abnormalities.  2. The right ventricle has normal systolic function. The cavity was normal. There is no increase in right ventricular wall thickness. Right ventricular systolic pressure upper normal with an estimated pressure of 34.6 mmHg.  3. Left atrial size was moderately dilated.  4. The aortic valve is tricuspid. Mild calcification of the aortic valve. Moderate aortic annular calcification noted.  5. The mitral valve is normal in structure. Mitral valve regurgitation is mild to moderate by color flow Doppler.  6. The tricuspid valve is normal in structure.  7. The aortic root is normal in size and structure.  Patient Profile     84 y.o. female with a history of chronic diastolic heart failure, pulmonary hypertension, paroxysmal atrial fibrillation, CKD stage III, and CAD status post DES to the proximal LAD in 2013.  Now being evaluated by cardiology with postoperative atrial fibrillation and enzymatic evidence of NSTEMI following robotic repair of paraesophageal hernia on March 6.  Assessment & Plan    1: Atrial fibrillation with rapid ventricular response- history of PAF in the past on Eliquis which was discontinued by Dr. Bronson Ing because of anemia.  She currently is in A. fib with heart rates in the low 100 range on low-dose beta-blocker.  Blood pressure is somewhat soft.  I began to  on Eliquis and start IV amiodarone for chemical cardioversion, although she remains in A. fib today.  Apparently she briefly converted last night.  She is having difficulty swallowing this morning and therefore we will keep her on IV amiodarone until we can safely convert her to an oral load.  2: Non-STEMI- troponins rose to 10.  Patient is completely asymptomatic.  She did have a LAD stent by Dr. Fletcher Anon in 2013 with a negative Myoview in 2016.  She is on low-dose aspirin.  At  this point, I do not feel compelled to pursue an invasive catheterization approach.  I suspect her elevated enzymes are demand ischemia related to rapid ventricular response from her A. fib.  Uncomfortable getting a pharmacologic Myoview stress test as an outpatient to further evaluate.  3: Coronary artery disease- history of LAD stenting by Dr. Billey Gosling 2013 with a negative Myoview stress test in 2016.  The patient denies chest pain.       For questions or updates, please contact [place] Please consult [email or web address] for contact info under        Signed, Quay Burow, MD  06/09/2018, 8:16 AM

## 2018-06-09 NOTE — Evaluation (Signed)
Clinical/Bedside Swallow Evaluation Patient Details  Name: [name] MRN: [id number] Date of Birth: [birth year]  Today's Date: 06/09/2018 Time: SLP Start Time (ACUTE ONLY): 1037 SLP Stop Time (ACUTE ONLY): 1051 SLP Time Calculation (min) (ACUTE ONLY): 14 min  Past Medical History:  Past Medical History:  Diagnosis Date  . Anemia due to blood loss, chronic   . Anxiety state, unspecified   . Atrophy of left kidney   . Cancer ([place])    melanoma on back  . Cancer of right colon ([place]) 05/20/2017  . CKD (chronic kidney disease), stage III (HCC)      sees Dr. Lowanda Foster   . Colon cancer ([place]) 2019   stage 3  . Colostomy in place [facility])    in 2016  . Coronary artery disease cardiologist-  dr Bronson Ing   a. Moderate to severe coronary disease involving the left anterior descending artery and right coronary artery.  Sequential stenosis in the LAD is significant.  Right coronary artery is moderate to severely likely nonischemic. Questionable small LVOT obstruction.  No Brockenbrough maneuver was performed.  Catheterization January 2013 w/ PTCA and DES x1 to pLAD b. 05/2014 MV no isch/infarct, EF 60%.  . Depressive disorder, not elsewhere classified   . Diastolic CHF, chronic [facility])    cardiologist-  dr Bronson Ing   pt. denies at preop. 06/07/18 - EF 60-65% on echo, no obvious dysfunction appreciated  . Diversion colitis 2016  . Epidural abscess L5- S1 03/01/2016  . Gastro-esophageal reflux disease without esophagitis 07/16/2007   Overview:  Overview:  Qualifier: Diagnosis of  By: Nelson-Smith CMA (AAMA), Dottie  Overview:  Overview:  Overview:  Qualifier: Diagnosis of  By: Nelson-Smith CMA (AAMA), Dottie   . GERD (gastroesophageal reflux disease)   . H/O hiatal hernia   . History of [name]'s esophagus   . History of melanoma excision    back  . Hypertension   . Hypothyroidism   . Infection of spine ([place])    in hospial  . Macular degeneration    both eyes  . PAF (paroxysmal atrial fibrillation)  ([place])   . Parastomal hernia   . Pneumonia   . PONV (postoperative nausea and vomiting)   . Pulmonary hypertension ([place])    PA systolic [id number] mmHg by echocardiogram 06-17-2015  PA pressure 05-08-2011 by cardiac catheterization -- no sig. pulm. htn)PA saturation 62% thermodilution cardiac index 2.0 thick cardiac index 2.4  . Pure hypercholesterolemia   . S/P drug eluting coronary stent placement 05/08/2011   x1 DES to proximal LAD  . Symptomatic cholelithiasis 07/18/2016  . Tubulovillous adenoma of colon   . Wears dentures    lower  . Wears glasses    Past Surgical History:  Past Surgical History:  Procedure Laterality Date  . CARDIAC CATHETERIZATION Left 03/05/2015   Procedure: CENTRAL LINE INSERTION;  Surgeon: Aviva Signs, MD;  Location: [facility];  Service: General;  Laterality: Left;  . CATARACT EXTRACTION W/ INTRAOCULAR LENS  IMPLANT, BILATERAL  ~ 2002  . CHOLECYSTECTOMY N/A 07/18/2016   Procedure: LAPAROSCOPIC CHOLECYSTECTOMY WITH INTRAOPERATIVE CHOLANGIOGRAM;  Surgeon: [name], MD;  Location: [facility];  Service: General;  Laterality: N/A;  . COLECTOMY WITH COLOSTOMY CREATION/HARTMANN PROCEDURE N/A 03/05/2015   Procedure: PARTIAL COLECTOMY WITH COLOSTOMY CREATION/HARTMANN PROCEDURE;  Surgeon: Aviva Signs, MD;  Location: [facility];  Service: General;  Laterality: N/A;  . COLON SURGERY     colostomy bag  LLQ  . ESOPHAGEAL MANOMETRY N/A 06/06/2014   Procedure: ESOPHAGEAL MANOMETRY (EM);  Surgeon:  Jerene Bears, MD;  Location: [facility];  Service: Gastroenterology;  Laterality: N/A;  . ESOPHAGOGASTRODUODENOSCOPY (EGD) WITH PROPOFOL N/A 08/04/2015   Procedure: ESOPHAGOGASTRODUODENOSCOPY (EGD) WITH PROPOFOL;  Surgeon: Mauri Pole, MD;  Location: [facility];  Service: Endoscopy;  Laterality: N/A;  . HERNIA REPAIR  2019   left side/ removed mass  . IR GENERIC HISTORICAL  03/04/2016   IR LUMBAR DISC ASPIRATION W/IMG GUIDE 03/04/2016 Luanne Bras, MD [facility]  .  LAPAROSCOPIC NISSEN FUNDOPLICATION N/A 04/25/5807   Procedure: LAPAROSCOPIC  LYSIS OF ADHESIONS, SEGMENTAL GASTRECTOMY, PARTIAL REDUCTION OF HERNIA;  Surgeon: [name], MD;  Location: [facility];  Service: General;  Laterality: N/A;  . LAPAROSCOPIC PARTIAL COLECTOMY N/A 05/07/2017   Procedure: LAPAROSCOPIC  CONVERTED TO OPEN RIGHT COLECTOMY, PARASTOMAL HERNIA  REPAIR;  Surgeon: Leighton Ruff, MD;  Location: [facility];  Service: General;  Laterality: N/A;  . NISSEN FUNDOPLICATION  9833  . PERCUTANEOUS CORONARY STENT INTERVENTION (PCI-S) N/A 05/08/2011   Procedure: PERCUTANEOUS CORONARY STENT INTERVENTION (PCI-S);  Surgeon: Wellington Hampshire, MD;  Location: [facility] CATH LAB;  Service: Cardiovascular;  Laterality: N/A;  . ROTATOR CUFF REPAIR  ~ 2001   right  . SHOULDER ARTHROSCOPY  ~ 2004; 2005   left; "joint's wore out"  . TONSILLECTOMY  ~ 1944  . TUBAL LIGATION  1972   HPI:  Pt is an 84 yo female s/p laparoscopic repair of incarcerated hiatal hernia with lysis of adhesions. Per surgical note, pt is reporting that "everything is sticking when she swallows." Esophagram 3/7 showed a diffusely dilated esophagus and retained contrast in the distal esophagus at the end of the study. Pt with hx of GERD, [name]'s esophagus, PAF, CAD, colon CA s/p colostomy, CHF and macular degeneration   Assessment / Plan / Recommendation Clinical Impression  Pt has no overt symptoms of aspiration and oropharyngeal swallow appears swift and functional as can be observed clinically. Suspect that her symptoms may be more esophageal in nature given her history and pooling of contrast on her esophagram this admission. She decribes a globus sensation that was more noticeable with pureed solids, but also feeling like things "may come back up" even on clear liquid diet today. Of note, she had no regurgitation on trials today. SLP reviewed esophageal and aspiration precautions. Per surgeon's note, pt is to remain on a pureed diet or softer  for two weeks post-procedure. Could advance her diet back to purees from an oropharyngeal standpoint as long as she is comfortable with it, but will defer to surgery.  SLP will follow briefly for tolerance. SLP Visit Diagnosis: Dysphagia, unspecified (R13.10)    Aspiration Risk  Mild aspiration risk    Diet Recommendation Dysphagia 1 (Puree);Thin liquid   Liquid Administration via: Cup;Straw Medication Administration: Crushed with puree Supervision: Patient able to self feed;Intermittent supervision to cue for compensatory strategies Compensations: Slow rate;Small sips/bites;Follow solids with liquid Postural Changes: Seated upright at 90 degrees;Remain upright for at least 30 minutes after po intake    Other  Recommendations Oral Care Recommendations: Oral care BID   Follow up Recommendations None      Frequency and Duration min 2x/week  1 week       Prognosis Prognosis for Safe Diet Advancement: Good      Swallow Study   General HPI: Pt is an 84 yo female s/p laparoscopic repair of incarcerated hiatal hernia with lysis of adhesions. Per surgical note, pt is reporting that "everything is sticking when she swallows." Esophagram 3/7 showed a  diffusely dilated esophagus and retained contrast in the distal esophagus at the end of the study. Pt with hx of GERD, [name]'s esophagus, PAF, CAD, colon CA s/p colostomy, CHF and macular degeneration Type of Study: Bedside Swallow Evaluation Previous Swallow Assessment: none in chart Diet Prior to this Study: Thin liquids Temperature Spikes Noted: No Respiratory Status: Nasal cannula History of Recent Intubation: Yes Length of Intubations (days): (for procedure only) Date extubated: 06/05/18 Behavior/Cognition: Alert;Cooperative;Pleasant mood Oral Cavity Assessment: Within Functional Limits Oral Care Completed by SLP: No Oral Cavity - Dentition: Adequate natural dentition;Dentures, not available(bottom dentures not available) Vision:  Functional for self-feeding Self-Feeding Abilities: Able to feed self Patient Positioning: Upright in bed Baseline Vocal Quality: Normal Volitional Cough: Strong Volitional Swallow: Able to elicit    Oral/Motor/Sensory Function Overall Oral Motor/Sensory Function: Within functional limits   Ice Chips Ice chips: Not tested   Thin Liquid Thin Liquid: Within functional limits Presentation: Cup;Self Fed;Straw    Nectar Thick Nectar Thick Liquid: Not tested   Honey Thick Honey Thick Liquid: Not tested   Puree Puree: Within functional limits Presentation: Self Fed;Spoon   Solid     Solid: Not tested      [name] [name] 06/09/2018,1:01 PM  [name], M.A. [place] Acute Environmental education officer [phone] Office [phone]

## 2018-06-09 NOTE — Plan of Care (Signed)
Pt currently in chair. Ambulated this shift. No complaints of pain. Amiodarone still infusing. Vital signs stable. No complaints or changes at this time. Will continue to monitor.  Problem: Health Behavior/Discharge Planning: Goal: Ability to manage health-related needs will improve Outcome: Progressing   Problem: Clinical Measurements: Goal: Ability to maintain clinical measurements within normal limits will improve Outcome: Progressing Goal: Will remain free from infection Outcome: Progressing Goal: Diagnostic test results will improve Outcome: Progressing Goal: Respiratory complications will improve Outcome: Progressing Goal: Cardiovascular complication will be avoided Outcome: Progressing   Problem: Activity: Goal: Risk for activity intolerance will decrease Outcome: Progressing   Problem: Nutrition: Goal: Adequate nutrition will be maintained Outcome: Progressing   Problem: Coping: Goal: Level of anxiety will decrease Outcome: Progressing   Problem: Elimination: Goal: Will not experience complications related to bowel motility Outcome: Progressing Goal: Will not experience complications related to urinary retention Outcome: Progressing   Problem: Pain Managment: Goal: General experience of comfort will improve Outcome: Progressing   Problem: Safety: Goal: Ability to remain free from injury will improve Outcome: Progressing   Problem: Skin Integrity: Goal: Risk for impaired skin integrity will decrease Outcome: Progressing

## 2018-06-10 DIAGNOSIS — I10 Essential (primary) hypertension: Secondary | ICD-10-CM

## 2018-06-10 MED ORDER — METOPROLOL SUCCINATE ER 25 MG PO TB24
12.5000 mg | ORAL_TABLET | Freq: Once | ORAL | Status: AC
  Administered 2018-06-10: 12.5 mg via ORAL
  Filled 2018-06-10: qty 1

## 2018-06-10 MED ORDER — AMIODARONE HCL 200 MG PO TABS
400.0000 mg | ORAL_TABLET | Freq: Two times a day (BID) | ORAL | Status: DC
  Administered 2018-06-10 – 2018-06-11 (×3): 400 mg via ORAL
  Filled 2018-06-10 (×3): qty 2

## 2018-06-10 MED ORDER — METOPROLOL SUCCINATE ER 25 MG PO TB24
25.0000 mg | ORAL_TABLET | Freq: Every day | ORAL | Status: DC
  Administered 2018-06-11: 25 mg via ORAL
  Filled 2018-06-10: qty 1

## 2018-06-10 NOTE — Progress Notes (Signed)
  Speech Language Pathology Treatment: Dysphagia  Patient Details Name: [name] MRN: [id number] DOB: 1934/04/28 Today's Date: 06/10/2018 Time: 9381-0175 SLP Time Calculation (min) (ACUTE ONLY): 9 min  Assessment / Plan / Recommendation Clinical Impression  Pt says her odynophagia has resolved, and her symptoms are significantly mitigated today compared to previous date. Although her diet was advanced back to purees this morning, she has not yet had a tray with these textures. SLP provided skilled observation with thin liquid and puree snack with no overt signs of difficulty. Pt verbalized feeling full and needing to stop intake with Mod I. SLP reinforced education about additional esophageal and aspiration precautions. Given improving symptoms and no overt concerns for oropharyngeal dysphagia, SLP to sign off acutely. Further diet advancement can be done at the discretion of surgical team.    HPI HPI: Pt is an 84 yo female s/p laparoscopic repair of incarcerated hiatal hernia with lysis of adhesions. Per surgical note, pt is reporting that "everything is sticking when she swallows." Esophagram 3/7 showed a diffusely dilated esophagus and retained contrast in the distal esophagus at the end of the study. Pt with hx of GERD, [name]'s esophagus, PAF, CAD, colon CA s/p colostomy, CHF and macular degeneration      SLP Plan  All goals met       Recommendations  Diet recommendations: Dysphagia 1 (puree);Thin liquid;Other(comment)(advance per surgery) Liquids provided via: Cup;Straw Medication Administration: Whole meds with puree Supervision: Patient able to self feed;Intermittent supervision to cue for compensatory strategies Compensations: Slow rate;Small sips/bites;Follow solids with liquid Postural Changes and/or Swallow Maneuvers: Seated upright 90 degrees;Upright 30-60 min after meal                Oral Care Recommendations: Oral care BID Follow up Recommendations: None SLP Visit  Diagnosis: Dysphagia, unspecified (R13.10) Plan: All goals met       GO                [name] 06/10/2018, 9:21 AM  Pollyann Glen, M.A. [place] Acute Environmental education officer [phone] Office [phone]

## 2018-06-10 NOTE — Progress Notes (Signed)
Occupational Therapy Treatment Patient Details Name: [name] MRN: [id number] DOB: Feb 09, 1935 Today's Date: 06/10/2018    History of present illness Pt is an 84 yo female admitted 06/05/18 s/p laparoscopic repair of incarcerated hiatal hernia with lysis of adhesions. PMH includes PAF, CAD, colon CA s/p colostomy, CHF, macular degeneration.   OT comments  Pt progressing towards OT goals this session. Pt able to complete ambulation to bathroom without DME, toilet transfer and peri care at supervision/mod I. Pt then able to complete 3 consecutive grooming tasks at sink with supervision (approaching mod I) and LB dressing in recliner. Pt then able to perform bed mobility to return to supine at independent level. HR initially 115 and highest was 130. OT will continue to follow acutely, dc remains appropriate.    Follow Up Recommendations  No OT follow up    Equipment Recommendations  None recommended by OT    Recommendations for Other Services      Precautions / Restrictions Precautions Precautions: Fall;Other (comment) Precaution Comments: Watch HR (tachycardic) Restrictions Weight Bearing Restrictions: No       Mobility Bed Mobility Overal bed mobility: Independent             General bed mobility comments: Received sitting in recliner  Transfers Overall transfer level: Needs assistance Equipment used: None Transfers: Sit to/from Stand Sit to Stand: Supervision         General transfer comment: supervision for safety    Balance Overall balance assessment: Mild deficits observed, not formally tested Sitting-balance support: Feet supported;No upper extremity supported Sitting balance-Leahy Scale: Good     Standing balance support: No upper extremity supported Standing balance-Leahy Scale: Fair                             ADL either performed or assessed with clinical judgement   ADL Overall ADL's : Needs assistance/impaired     Grooming:  Supervision/safety;Standing;Oral care;Wash/dry face;Wash/dry Nurse, mental health Details (indicate cue type and reason): supervision for safety             Lower Body Dressing: Modified independent;Sitting/lateral leans Lower Body Dressing Details (indicate cue type and reason): donning socks in recliner Toilet Transfer: Supervision/safety;Ambulation   Toileting- Clothing Manipulation and Hygiene: Modified independent;Sit to/from stand       Functional mobility during ADLs: Supervision/safety       Vision       Perception     Praxis      Cognition Arousal/Alertness: Awake/alert Behavior During Therapy: WFL for tasks assessed/performed Overall Cognitive Status: Within Functional Limits for tasks assessed                                          Exercises     Shoulder Instructions       General Comments HR was 115 initially in supine, up to 130 with in room mobility     Pertinent Vitals/ Pain       Pain Assessment: No/denies pain  Home Living                                          Prior Functioning/Environment              Frequency  Min 2X/week  Progress Toward Goals  OT Goals(current goals can now be found in the care plan section)  Progress towards OT goals: Progressing toward goals  Acute Rehab OT Goals Patient Stated Goal: get back to pickle ball OT Goal Formulation: With patient Time For Goal Achievement: 06/22/18 Potential to Achieve Goals: Good  Plan Discharge plan remains appropriate;Frequency remains appropriate    Co-evaluation                 AM-PAC OT "6 Clicks" Daily Activity     Outcome Measure   Help from another person eating meals?: None Help from another person taking care of personal grooming?: None Help from another person toileting, which includes using toliet, bedpan, or urinal?: None Help from another person bathing (including washing, rinsing, drying)?: A Little Help  from another person to put on and taking off regular upper body clothing?: None Help from another person to put on and taking off regular lower body clothing?: None 6 Click Score: 23    End of Session Equipment Utilized During Treatment: Gait belt  OT Visit Diagnosis: Unsteadiness on feet (R26.81);Muscle weakness (generalized) (M62.81)   Activity Tolerance Patient tolerated treatment well   Patient Left in bed;with call bell/phone within reach;with family/visitor present   Nurse Communication Mobility status        Time: 1415-1440 OT Time Calculation (min): 25 min  Charges: OT General Charges $OT Visit: 1 Visit OT Treatments $Self Care/Home Management : 8-22 mins $Therapeutic Activity: 8-22 mins  [name] OTR/L Acute Rehabilitation Services Pager: [phone] Office: [place] 06/10/2018, 2:45 PM

## 2018-06-10 NOTE — Progress Notes (Signed)
Progress Note  Patient Name: [name] Date of Encounter: 06/10/2018  Primary Cardiologist: Dr. Bronson Ing  Subjective   Postop day 3 robotic hiatal hernia repair by Dr. Johney Maine.  Apparently this was a fairly long complicated operation.  The patient does have a history of PAF in the past as well as CAD status post LAD stenting in 2013 by Dr. Velva Harman.  She went into A. fib with RVR.  Her troponins rose to 10 although she is completely asymptomatic.  She was started on Eliquis on 06/08/2018 and IV amiodarone.  She remains in A. fib with controlled ventricular response.  She was also placed on IV amiodarone and has yet to convert.  We will transition her to oral load.  Inpatient Medications    Scheduled Meds: . acetaminophen  1,000 mg Oral Q8H  . apixaban  5 mg Oral BID  . aspirin EC  81 mg Oral Daily  . ferrous sulfate  325 mg Oral Q breakfast  . fluticasone  2 spray Each Nare Daily  . gabapentin  200 mg Oral TID  . isosorbide mononitrate  30 mg Oral q morning - 10a  . levothyroxine  50 mcg Oral QAC breakfast  . lip balm  1 application Topical BID  . loratadine  10 mg Oral QHS  . mouth rinse  15 mL Mouth Rinse BID  . metoprolol succinate  12.5 mg Oral Daily  . montelukast  10 mg Oral QHS  . rosuvastatin  10 mg Oral Daily  . Vitamin D (Ergocalciferol)  50,000 Units Oral Q Tue   Continuous Infusions: . amiodarone 30 mg/hr (06/10/18 0029)   PRN Meds: albuterol, ALPRAZolam, alum & mag hydroxide-simeth, bisacodyl, diphenhydrAMINE **OR** diphenhydrAMINE, enalaprilat, guaiFENesin-dextromethorphan, hydrALAZINE, hydrocortisone, hydrocortisone cream, HYDROmorphone (DILAUDID) injection, meclizine, menthol-cetylpyridinium, methocarbamol, metoprolol tartrate, nitroGLYCERIN, nystatin, ondansetron **OR** ondansetron (ZOFRAN) IV, phenol, polyethylene glycol, polyvinyl alcohol, prochlorperazine **OR** prochlorperazine, simethicone, zolpidem   Vital Signs    Vitals:   06/10/18 0400 06/10/18 0500  06/10/18 0700 06/10/18 0812  BP: (!) 142/75 129/85 (!) 136/125   Pulse:   (!) 122   Resp: (!) 25 20 (!) 35   Temp:    98.7 F (37.1 C)  TempSrc:    Oral  SpO2: 96%  95%   Weight:      Height:        Intake/Output Summary (Last 24 hours) at 06/10/2018 0844 Last data filed at 06/10/2018 0600 Gross per 24 hour  Intake 890.21 ml  Output 940 ml  Net -49.79 ml   Last 3 Weights 06/09/2018 06/08/2018 06/07/2018  Weight (lbs) 160 lb 0.9 oz 168 lb 10.4 oz 165 lb 5.5 oz  Weight (kg) 72.6 kg 76.5 kg 75 kg      Telemetry    Atrial fibrillation with rapid ventricular response- Personally Reviewed  ECG    Not performed today - Personally Reviewed  Physical Exam   GEN: No acute distress.   Neck: No JVD Cardiac: RRR, no murmurs, rubs, or gallops.  Respiratory: Clear to auscultation bilaterally. GI: Soft, nontender, non-distended  MS: No edema; No deformity. Neuro:  Nonfocal  Psych: Normal affect   Labs    Chemistry Recent Labs  Lab 06/06/18 2339 06/08/18 0913 06/09/18 0324  NA 134* 133* 134*  K 3.7 3.3* 4.2  CL 105 100 101  CO2 22 26 24   GLUCOSE 118* 137* 120*  BUN 15 12 11   CREATININE 1.17* 1.22* 1.20*  CALCIUM 7.6* 7.8* 8.0*  GFRNONAA 44* 42* 42*  GFRAA 51* 48* 49*  ANIONGAP 7 7 9      Hematology Recent Labs  Lab 06/08/18 0913 06/09/18 0324  WBC 12.2* 10.8*  RBC 4.21 3.91  HGB 11.0* 10.7*  HCT 35.1* 32.8*  MCV 83.4 83.9  MCH 26.1 27.4  MCHC 31.3 32.6  RDW 15.2 15.5  PLT 177 198    Cardiac Enzymes Recent Labs  Lab 06/06/18 2339 06/08/18 0913 06/08/18 1412 06/08/18 2102  TROPONINI 10.58* 5.01* 4.34* 4.39*   No results for input(s): TROPIPOC in the last 168 hours.   BNPNo results for input(s): BNP, PROBNP in the last 168 hours.   DDimer No results for input(s): DDIMER in the last 168 hours.   Radiology    No results found.  Cardiac Studies   2D echocardiogram (06/07/2018)  IMPRESSIONS    1. The left ventricle has normal systolic function  with an ejection fraction of 60-65%. The cavity size was normal. Left ventricular diastolic Doppler parameters are indeterminate in the setting of atrial fibrillation. No obvious wall motion  abnormalities.  2. The right ventricle has normal systolic function. The cavity was normal. There is no increase in right ventricular wall thickness. Right ventricular systolic pressure upper normal with an estimated pressure of 34.6 mmHg.  3. Left atrial size was moderately dilated.  4. The aortic valve is tricuspid. Mild calcification of the aortic valve. Moderate aortic annular calcification noted.  5. The mitral valve is normal in structure. Mitral valve regurgitation is mild to moderate by color flow Doppler.  6. The tricuspid valve is normal in structure.  7. The aortic root is normal in size and structure.  Patient Profile     84 y.o. female with a history of chronic diastolic heart failure, pulmonary hypertension, paroxysmal atrial fibrillation, CKD stage III, and CAD status post DES to the proximal LAD in 2013.  Now being evaluated by cardiology with postoperative atrial fibrillation and enzymatic evidence of NSTEMI following robotic repair of paraesophageal hernia on March 6.  She was seen the following day by the cardiology fellow and started on Eliquis on June 08, 1938 8 hours after going into atrial fibrillation.  Assessment & Plan    1: Atrial fibrillation with rapid ventricular response- history of PAF in the past on Eliquis which was discontinued by Dr. Bronson Ing because of anemia.  She currently is in A. fib with heart rates in the low 100 range on low-dose beta-blocker.  Blood pressure is somewhat soft.  I began to  on Eliquis and start IV amiodarone for chemical cardioversion, although she remains in A. fib today.  Apparently she briefly converted last night.  Her swallowing issues have slowly improved.  I am going to switch her to p.o. amiodarone load.  I am also going to increase her  beta-blocker for rate control.  Given the fact that she had 48 hours in A. fib off anticoagulation due to her surgery she is not a candidate for cardioversion without transesophageal echo guidance which I am hesitant to do with her recent surgery and therefore probably will require 4 weeks of oral anticoagulation followed by elective outpatient cardioversion.  2: Non-STEMI- troponins rose to 10.  Patient is completely asymptomatic.  She did have a LAD stent by Dr. Fletcher Anon in 2013 with a negative Myoview in 2016.  She is on low-dose aspirin.  At this point, I do not feel compelled to pursue an invasive catheterization approach.  I suspect her elevated enzymes are demand ischemia related to rapid ventricular  response from her A. fib.  Uncomfortable getting a pharmacologic Myoview stress test as an outpatient to further evaluate.  3: Coronary artery disease- history of LAD stenting by Dr. Fletcher Anon in 2013 with a negative Myoview stress test in 2016.  The patient denies chest pain.   Remains in A. fib, stable for discharge to telemetry.  Can be discharged home from cardiology point of view with close outpatient follow-up.  I rather  For questions or updates, please contact [place] Please consult [email or web address] for contact info under        Signed, Quay Burow, MD  06/10/2018, 8:44 AM

## 2018-06-10 NOTE — Progress Notes (Signed)
Physical Therapy Treatment Patient Details Name: [name] MRN: [id number] DOB: September 01, 1934 Today's Date: 06/10/2018    History of Present Illness Pt is an 84 yo female admitted 06/05/18 s/p laparoscopic repair of incarcerated hiatal hernia with lysis of adhesions. PMH includes PAF, CAD, colon CA s/p colostomy, CHF, macular degeneration.   PT Comments    Pt progressing well with mobility. Able to increase ambulation without device, requiring intermittent minA to maintain balance. Recommend continued ambulation with nursing staff using RW. Discussed RW for home use and rollator for community ambulation; pt will have initial 24/7 support from family.  HR 128-148 with mobility, SpO2 >90% on RA; RN aware   Follow Up Recommendations  No PT follow up;Supervision for mobility/OOB     Equipment Recommendations  None recommended by PT    Recommendations for Other Services       Precautions / Restrictions Precautions Precautions: Fall;Other (comment) Precaution Comments: Watch HR (tachycardic) Restrictions Weight Bearing Restrictions: No    Mobility  Bed Mobility               General bed mobility comments: Received sitting in recliner  Transfers Overall transfer level: Needs assistance Equipment used: None Transfers: Sit to/from Stand Sit to Stand: Supervision            Ambulation/Gait Ambulation/Gait assistance: Min guard;Min assist Gait Distance (Feet): 290 Feet Assistive device: None Gait Pattern/deviations: Step-through pattern;Decreased stride length;Staggering left Gait velocity: Decreased Gait velocity interpretation: 1.31 - 2.62 ft/sec, indicative of limited community ambulator General Gait Details: Slow, mostly steady gait with close min guard for balance; 2x LOB (one noted with turning a corner, another with head turn) requiring minA to correct. HR 128-148; pt cued for standing rest breaks to lower HR. DOE up to 3/4; SpO2 >90% on RA   Stairs              Wheelchair Mobility    Modified Rankin (Stroke Patients Only)       Balance Overall balance assessment: Needs assistance Sitting-balance support: Feet supported;No upper extremity supported Sitting balance-Leahy Scale: Good     Standing balance support: No upper extremity supported Standing balance-Leahy Scale: Fair                              Cognition Arousal/Alertness: Awake/alert Behavior During Therapy: WFL for tasks assessed/performed Overall Cognitive Status: Within Functional Limits for tasks assessed                                        Exercises      General Comments General comments (skin integrity, edema, etc.): RN aware of HR 128-148 with mobility. Increased time spent discussing pt's transition to home; recommend RW for household use and rollator for community ambulation, pt reports will have family avaialble for initial 24/7 support; no stairs into home, all one level and accessible in retirement community      Pertinent Vitals/Pain Pain Assessment: No/denies pain    Home Living                      Prior Function            PT Goals (current goals can now be found in the care plan section) Acute Rehab PT Goals Patient Stated Goal: get back to pickle ball PT Goal Formulation: With  patient Time For Goal Achievement: 06/20/18 Potential to Achieve Goals: Good Progress towards PT goals: Progressing toward goals    Frequency    Min 3X/week      PT Plan Current plan remains appropriate    Co-evaluation              AM-PAC PT "6 Clicks" Mobility   Outcome Measure  Help needed turning from your back to your side while in a flat bed without using bedrails?: A Little Help needed moving from lying on your back to sitting on the side of a flat bed without using bedrails?: A Little Help needed moving to and from a bed to a chair (including a wheelchair)?: A Little Help needed standing up from a  chair using your arms (e.g., wheelchair or bedside chair)?: A Little Help needed to walk in hospital room?: A Little Help needed climbing 3-5 steps with a railing? : A Lot 6 Click Score: 17    End of Session Equipment Utilized During Treatment: Gait belt Activity Tolerance: Patient tolerated treatment well Patient left: in chair;with call bell/phone within reach Nurse Communication: Mobility status PT Visit Diagnosis: Difficulty in walking, not elsewhere classified (R26.2)     Time: 1123-1140 PT Time Calculation (min) (ACUTE ONLY): 17 min  Charges:  $Gait Training: 8-22 mins                    Mabeline Caras, PT, DPT Acute Rehabilitation Services  Pager [phone] Office [place] 06/10/2018, 1:49 PM

## 2018-06-10 NOTE — Progress Notes (Signed)
[name] [id number] 13-Dec-1934  CARE TEAM:  PCP: Terald Sleeper, PA-C  Outpatient Care Team: Patient Care Team: [name] as PCP - General (General Practice) Okey Regal, [place] (Optometry) Herminio Commons, MD as Attending Physician (Cardiology) Truitt Merle, MD as Consulting Physician (Hematology) Leighton Ruff, MD as Consulting Physician (Colon and Rectal Surgery) Michael Boston, MD as Consulting Physician (General Surgery) Pyrtle, Lajuan Lines, MD as Consulting Physician (Gastroenterology) Fran Lowes, MD as Consulting Physician (Nephrology)  Inpatient Treatment Team: Treatment Team: Attending Provider: Michael Boston, MD; Technician: Raynelle Highland, NT; Consulting Physician: Lbcardiology, Michae Kava, MD; Registered Nurse: Chriss Driver, RN; Registered Nurse: France Ravens, RN; Registered Nurse: Oswaldo Conroy, RN; Occupational Therapist: Jaci Carrel, OT; Registered Nurse: Elspeth Cho, RN   Problem List:   Principal Problem:   Incarcerated recurrent hiatal hernia s/p robotic repair 06/05/2018 Active Problems:   CKD (chronic kidney disease) stage 3, GFR 30-59 ml/min (HCC)   Anemia due to chronic blood loss   Essential hypertension   Gastric volvulus   Irritable bowel syndrome with constipation   GERD (gastroesophageal reflux disease) s/p Toupet fundoplication (partial posterior wrap)   S/P robotic partial posterior (Toupet) fundoplication 0/11/2328   Pulmonary hypertension (HCC)   Hypothyroidism   History of [name]'s esophagus   Anxiety state   5 Days Post-Op  06/05/2018  POST-OPERATIVE DIAGNOSIS:  Incarcerated, recurrent Hiatal Hernia with organoaxial volvulus  PROCEDURE:   XI ROBOTIC REPAIR PARASOPHAGEAL HIATAL HERNIA ABSORBABLE MESH REINFORCEMENT TOUPET FUNDOPLICATION LYSIS OF ADHESIONS X 3 HOURS EGD  SURGEON:  Adin Hector, MD   Assessment  Recovering  [facility] Stay = 5 days)  Plan:  I wrote transfers orders for telemetry  yesterday.  There are no beds.  They are waiting.  Hopefully she can get up there today.  -Advance back to dysphagia 1 diet since no evidence of any significant dysphasia by speech therapy evaluation and dysphasia less today.  Stable from discharge from a surgery standpoint, but obviously discharge delayed until fibrillation can be better controlled.  -defer cardiac workup for postop Afib & elevated troponin to Cardiology - help appreciated.  I believe the concern is that she needs to be anticoagulated for a while before they attempt cardioversion.  Might transition to an outpatient plan.  Rate control and anticoagulation for now.  Amiodarone drip.  She is already tolerating most of her pills, so I do not see a problem with transitioning to oral medication.    -anticoagulation as needed.  Eliquis  -continue mediastinal drain  -hypothyroidism - synthroid  -Mildly elevated creatinine most likely reflection of her chronic kidney disease.  Not particularly abnormal.  Agree with trying to keep her on the dry side from a cardiopulmonary standpoint which is pretty standard even for hiatal hernias, let alone in the setting of A. fib with RVR, etc.  -hyperchol - Crestor  -VTE prophylaxis- SCDs, etc  -mobilize as tolerated to help recovery.  Cleared by occupational physical therapy.  Walking well with nursing.  That is encouraging.  Most likely can transition home and avoid skilled nursing facility.  We will see.  30 minutes spent in review, evaluation, examination, counseling, and coordination of care.  More than 50% of that time was spent in counseling.  06/10/2018    Subjective: (Chief complaint)  Sitting in chair.  Nursing in room.  Cleared by physical and Occupational Therapy.  Speech therapy evaluation argued against any oropharyngeal aspiration.  Her dysphasia is less.  Tolerating clears better.  Tolerating applesauce with her medications.  Switched to IV amiodarone.  Heart rate  somewhat irregular but blood pressure more stable.  Denies pain or shortness of breath.  Objective:  Vital signs:  Vitals:   06/10/18 0200 06/10/18 0300 06/10/18 0400 06/10/18 0500  BP: 119/76 124/81 (!) 142/75 129/85  Pulse:      Resp: 16 16 (!) 25 20  Temp:  98.4 F (36.9 C)    TempSrc:  Oral    SpO2:   96%   Weight:      Height:        Last BM Date: 06/09/18(colostomy bag)  Intake/Output   Yesterday:  03/10 0701 - 03/11 0700 In: 918.3 [P.O.:540; I.V.:378.3] Out: 940 [Urine:575; Drains:240; Stool:125] This shift:  No intake/output data recorded.  Bowel function:  Flatus: YES  BM:  YES  Drain: Serous   Physical Exam:  General: Pt awake/alert/oriented x4 in no acute distress.  Smiling.  Happy and alert. Eyes: PERRL, normal EOM.  Sclera clear.  No icterus Neuro: CN II-XII intact w/o focal sensory/motor deficits. Lymph: No head/neck/groin lymphadenopathy Psych:  No delerium/psychosis/paranoia HENT: Normocephalic, Mucus membranes moist.  No thrush Neck: Supple, No tracheal deviation Chest: No chest wall pain w good excursion CV:  Pulses intact.  Regular rhythm MS: Normal AROM mjr joints.  No obvious deformity  Abdomen: Soft.  Nondistended.  Nontender.  Permanent end colostomy pink with stool in bag.  No evidence of peritonitis.  No incarcerated hernias.  Ext:  No deformity.  No mjr edema.  No cyanosis Skin: No petechiae / purpura  Results:   Patient Name:   [name] Date of Exam: 06/07/2018 Medical Rec #:  [id number]  Height:       62.5 in Accession #:    [id number] Weight:       165.3 lb Date of Birth:  [birth year]  BSA:          1.77 m Patient Age:    84 years   BP:           129/76 mmHg Patient Gender: F          HR:           68 bpm. Exam Location:  Inpatient    Procedure: 2D Echo  Indications:    Atrial Fibrillation 427.31   History:        Patient has prior history of Echocardiogram examinations, most                 recent 05/29/2017. CHF,  CAD, Pulmonary HTN; Risk Factors:                 Hypertension.   Sonographer:    Mikki Santee RDCS (AE) Referring Phys: [place]    1. The left ventricle has normal systolic function with an ejection fraction of 60-65%. The cavity size was normal. Left ventricular diastolic Doppler parameters are indeterminate in the setting of atrial fibrillation. No obvious wall motion  abnormalities.  2. The right ventricle has normal systolic function. The cavity was normal. There is no increase in right ventricular wall thickness. Right ventricular systolic pressure upper normal with an estimated pressure of 34.6 mmHg.  3. Left atrial size was moderately dilated.  4. The aortic valve is tricuspid. Mild calcification of the aortic valve. Moderate aortic annular calcification noted.  5. The mitral valve is normal in structure. Mitral valve regurgitation is mild to moderate by color flow  Doppler.  6. The tricuspid valve is normal in structure.  7. The aortic root is normal in size and structure.  FINDINGS  Left Ventricle: The left ventricle has normal systolic function, with an ejection fraction of 60-65%. The cavity size was normal. There is no increase in left ventricular wall thickness. Left ventricular diastolic Doppler parameters are indeterminate Right Ventricle: The right ventricle has normal systolic function. The cavity was normal. There is no increase in right ventricular wall thickness. Right ventricular systolic pressure upper normal with an estimated pressure of 34.6 mmHg. Left Atrium: left atrial size was moderately dilated Right Atrium: right atrial size was mild-moderately dilated. Right atrial pressure is estimated at 3 mmHg. Interatrial Septum: No atrial level shunt detected by color flow Doppler. Pericardium: There is no evidence of pericardial effusion. There is a pericardial fat pad noted. Mitral Valve: The mitral valve is normal in structure.  Mitral valve regurgitation is mild to moderate by color flow Doppler. Tricuspid Valve: The tricuspid valve is normal in structure. Tricuspid valve regurgitation is mild by color flow Doppler. Aortic Valve: The aortic valve is tricuspid Mild calcification of the aortic valve. Aortic valve regurgitation was not visualized by color flow Doppler. Moderate aortic annular calcification noted. Pulmonic Valve: The pulmonic valve was grossly normal. Pulmonic valve regurgitation is trivial by color flow Doppler. Aorta: The aortic root is normal in size and structure.   LEFT VENTRICLE PLAX 2D (Teich) LV EF:          54.4 % LVIDd:          4.30 cm LVIDs:          3.10 cm LV PW:          0.90 cm LV IVS:         0.90 cm LVOT diam:      2.20 cm LV SV:          45 ml LVOT Area:      3.80 cm  RIGHT VENTRICLE RVSP:           34.6 mmHg  LEFT ATRIUM              Index       RIGHT ATRIUM           Index LA diam:        4.40 cm  2.48 cm/m  RA Pressure: 3 mmHg LA Vol (A2C):   56.6 ml  31.91 ml/m RA Area:     21.40 cm LA Vol (A4C):   115.0 ml 64.84 ml/m RA Volume:   65.10 ml  36.70 ml/m LA Biplane Vol: 80.6 ml  45.44 ml/m  AORTIC VALVE LVOT Vmax:   84.40 cm/s LVOT Vmean:  57.000 cm/s LVOT VTI:    0.133 m   AORTA Ao Root diam: 3.00 cm  MR Peak grad: 113.2 mmHg  TRICUSPID VALVE MR Vmax:      532.00 cm/s TR Peak grad:   31.6 mmHg                           TR Vmax:        281.00 cm/s                           RVSP:           34.6 mmHg    Rozann Lesches MD Electronically signed by Rozann Lesches MD Signature Date/Time: 06/07/2018/11:26:23 AM  Labs: Results for orders placed or performed during the hospital encounter of 06/05/18 (from the past 48 hour(s))  CBC     Status: Abnormal   Collection Time: 06/08/18  9:13 AM  Result Value Ref Range   WBC 12.2 (H) 4.0 - 10.5 K/uL   RBC 4.21 3.87 - 5.11 MIL/uL   Hemoglobin 11.0 (L) 12.0 - 15.0 g/dL   HCT 35.1 (L) 36.0 - 46.0 %   MCV 83.4  80.0 - 100.0 fL   MCH 26.1 26.0 - 34.0 pg   MCHC 31.3 30.0 - 36.0 g/dL   RDW 15.2 11.5 - 15.5 %   Platelets 177 150 - 400 K/uL   nRBC 0.0 0.0 - 0.2 %    Comment: Performed at [facility], [place] [address] [place], [place] [address]  Troponin I - Now Then Q6H     Status: Abnormal   Collection Time: 06/08/18  9:13 AM  Result Value Ref Range   Troponin I 5.01 (HH) <0.03 ng/mL    Comment: CRITICAL RESULT CALLED TO, READ BACK BY AND VERIFIED WITH: KIMBLE,M RN @ [phone] 06/08/18 LEONARD,A Performed at [facility], [place] [address] [place], [place] [address]   Basic metabolic panel     Status: Abnormal   Collection Time: 06/08/18  9:13 AM  Result Value Ref Range   Sodium 133 (L) 135 - 145 mmol/L   Potassium 3.3 (L) 3.5 - 5.1 mmol/L   Chloride 100 98 - 111 mmol/L   CO2 26 22 - 32 mmol/L   Glucose, Bld 137 (H) 70 - 99 mg/dL   BUN 12 8 - 23 mg/dL   Creatinine, Ser 1.22 (H) 0.44 - 1.00 mg/dL   Calcium 7.8 (L) 8.9 - 10.3 mg/dL   GFR calc non Af Amer 42 (L) >60 mL/min   GFR calc Af Amer 48 (L) >60 mL/min   Anion gap 7 5 - 15    Comment: Performed at [facility] [address], [place], [place] [address]  TSH     Status: None   Collection Time: 06/08/18  9:13 AM  Result Value Ref Range   TSH 1.950 0.350 - 4.500 uIU/mL    Comment: Performed by a 3rd Generation assay with a functional sensitivity of <=0.01 uIU/mL. Performed at [facility], [place] [address] [place], [place] [address]   Troponin I - Now Then Q6H     Status: Abnormal   Collection Time: 06/08/18  2:12 PM  Result Value Ref Range   Troponin I 4.34 (HH) <0.03 ng/mL    Comment: CRITICAL VALUE NOTED.  VALUE IS CONSISTENT WITH PREVIOUSLY REPORTED AND CALLED VALUE. Performed at [facility], [place] [address], [place], [place] [address]   Troponin I - Now Then Q6H     Status: Abnormal   Collection Time: 06/08/18  9:02 PM  Result Value Ref Range   Troponin I 4.39 (HH) <0.03 ng/mL    Comment: CRITICAL VALUE  NOTED.  VALUE IS CONSISTENT WITH PREVIOUSLY REPORTED AND CALLED VALUE. Performed at [facility], [place] [address], [place], [place] [address]   Basic metabolic panel     Status: Abnormal   Collection Time: 06/09/18  3:24 AM  Result Value Ref Range   Sodium 134 (L) 135 - 145 mmol/L   Potassium 4.2 3.5 - 5.1 mmol/L   Chloride 101 98 - 111 mmol/L   CO2 24 22 - 32 mmol/L   Glucose, Bld 120 (  H) 70 - 99 mg/dL   BUN 11 8 - 23 mg/dL   Creatinine, Ser 1.20 (H) 0.44 - 1.00 mg/dL   Calcium 8.0 (L) 8.9 - 10.3 mg/dL   GFR calc non Af Amer 42 (L) >60 mL/min   GFR calc Af Amer 49 (L) >60 mL/min   Anion gap 9 5 - 15    Comment: Performed at [facility] [address], [place], [place] [address]  CBC     Status: Abnormal   Collection Time: 06/09/18  3:24 AM  Result Value Ref Range   WBC 10.8 (H) 4.0 - 10.5 K/uL   RBC 3.91 3.87 - 5.11 MIL/uL   Hemoglobin 10.7 (L) 12.0 - 15.0 g/dL   HCT 32.8 (L) 36.0 - 46.0 %   MCV 83.9 80.0 - 100.0 fL   MCH 27.4 26.0 - 34.0 pg   MCHC 32.6 30.0 - 36.0 g/dL   RDW 15.5 11.5 - 15.5 %   Platelets 198 150 - 400 K/uL   nRBC 0.0 0.0 - 0.2 %    Comment: Performed at [facility], [place] [address], [place], [place] [address]    Imaging / Studies: No results found.  Medications / Allergies: per chart  Antibiotics: Anti-infectives (From admission, onward)   Start     Dose/Rate Route Frequency Ordered Stop   06/05/18 1700  metroNIDAZOLE (FLAGYL) IVPB 500 mg     500 mg 100 mL/hr over 60 Minutes Intravenous Every 8 hours 06/05/18 1621 06/05/18 1813   06/05/18 0600  cefTRIAXone (ROCEPHIN) 2 g in sodium chloride 0.9 % 100 mL IVPB     2 g 200 mL/hr over 30 Minutes Intravenous On call to O.R. 06/05/18 0541 06/05/18 1626   06/05/18 0600  metroNIDAZOLE (FLAGYL) IVPB 500 mg     500 mg 100 mL/hr over 60 Minutes Intravenous On call to O.R. 06/05/18 0541 06/05/18 0849        Note: Portions of this report may have been transcribed using voice  recognition software. Every effort was made to ensure accuracy; however, inadvertent computerized transcription errors may be present.   Any transcriptional errors that result from this process are unintentional.     Adin Hector, MD, FACS, MASCRS Gastrointestinal and Minimally Invasive Surgery    [address] [place], [place] [address] [phone] Main / Paging [phone] Fax

## 2018-06-11 DIAGNOSIS — Z7901 Long term (current) use of anticoagulants: Secondary | ICD-10-CM

## 2018-06-11 DIAGNOSIS — I214 Non-ST elevation (NSTEMI) myocardial infarction: Secondary | ICD-10-CM

## 2018-06-11 MED ORDER — ONDANSETRON 4 MG PO TBDP: 4.0000 mg | ORAL_TABLET | Freq: Four times a day (QID) | ORAL | 12 refills | Status: AC | PRN

## 2018-06-11 MED ORDER — APIXABAN 5 MG PO TABS: 5.0000 mg | ORAL_TABLET | Freq: Two times a day (BID) | ORAL | 2 refills | Status: AC

## 2018-06-11 MED ORDER — AMIODARONE HCL 400 MG PO TABS: 400.0000 mg | ORAL_TABLET | Freq: Two times a day (BID) | ORAL | 2 refills | Status: DC

## 2018-06-11 NOTE — Discharge Summary (Signed)
Physician Discharge Summary    Patient ID: [name] MRN: [id number] DOB/AGE: 08/29/1934  84 y.o.  Patient Care Team: Terald Sleeper, PA-C as PCP - General (General Practice) Okey Regal, [place] (Optometry) Herminio Commons, MD as Attending Physician (Cardiology) Truitt Merle, MD as Consulting Physician (Hematology) Leighton Ruff, MD as Consulting Physician (Colon and Rectal Surgery) Michael Boston, MD as Consulting Physician (General Surgery) Pyrtle, Lajuan Lines, MD as Consulting Physician (Gastroenterology) Fran Lowes, MD as Consulting Physician (Nephrology)  Admit date: 06/05/2018  Discharge date: 06/11/2018  Hospital Stay = 6 days    Discharge Diagnoses:  Principal Problem:   Incarcerated recurrent hiatal hernia s/p robotic repair 06/05/2018 Active Problems:   CKD (chronic kidney disease) stage 3, GFR 30-59 ml/min (HCC)   Anemia due to chronic blood loss   PAF (paroxysmal atrial fibrillation) ([place])   Essential hypertension   Gastric volvulus   Irritable bowel syndrome with constipation   GERD (gastroesophageal reflux disease) s/p Toupet fundoplication (partial posterior wrap)   S/P robotic partial posterior (Toupet) fundoplication 0/05/5425   Pulmonary hypertension ([place])   Hypothyroidism   History of [name]'s esophagus   Anxiety state   Non-STEMI (non-ST elevated myocardial infarction) ([place])   Current use of long term anticoagulation   6 Days Post-Op  06/05/2018  POST-OPERATIVE DIAGNOSIS:Incarcerated, recurrentHiatalHerniawith organoaxial volvulus  PROCEDURE:  XI ROBOTIC REPAIR PARASOPHAGEAL HIATAL HERNIA ABSORBABLEMESH REINFORCEMENT TOUPETFUNDOPLICATION LYSIS OF ADHESIONS X3HOURS EGD  SURGEON: Adin Hector, MD  Consults: cardiology  Hospital Course:   Pleasant elderly woman with history of hiatal hernia repair done in open fashion in the 1990s.  Developed recurrence.  Had worsening heartburn and dysphasia.  Laparoscopic repair attempt in  2016 aborted secondary to adhesions.  Patient had worsening volvulus [place] and pain and discomfort.  Wished to reattempt.  Cleared by cardiology.  History of coronary disease in the past.  The patient underwent the surgery above.    Patient felt immediate relief compared to her baseline.  She did have some questionable chest pain.  She developed postop tachycardia and was found to be in atrial fibrillation.  She had markedly elevated troponin suspicious for myocardial infarction but denied chest pain.  Cardiology was intimately involved.  She was transferred to [facility] for the possibility of needing more aggressive intervention.  However she quickly stabilized.  It was felt by cardiology that she did not need repeat catheterization or intervention at this time.  Her atrial fibrillation was resistant to beta blockade so she was switched over to amiodarone.  Her tachycardia while not totally resolved was markedly improved with rate control and cardiology felt it was safe to discharge.  Given her recurrent atrial fibrillation they placed her on Eliquis anticoagulation.  Dr. Alvester Chou tentatively is hoping that she could undergo cardioversion in a few weeks after appropriate anticoagulation.  Postoperatively, the patient gradually mobilized and advanced to a pured diet.  She did complain of some dysphasia that seem more oropharyngeal.  Speech therapy evaluation detected no evidence of aspiration risk.  Her dysphasia rapidly resolved and she was able to be advanced and tolerated a dysphagia 1 pured diet.Marland Kitchen  She was evaluated physical and Occupational Therapy and found to be doing quite well and did not need skilled nursing facility or home health.  By the time of discharge, the patient was walking well the hallways, eating food, having flatus.  Pain was well-controlled on an oral medications.  Based on meeting discharge criteria and continuing to recover, I felt it was safe  for the patient to be discharged from the  hospital to further recover with close followup. Postoperative recommendations were discussed in detail.  They are written as well.  Discharged Condition: stable  Discharge Exam: Blood pressure (!) 141/92, pulse (!) 116, temperature 98.7 F (37.1 C), temperature source Oral, resp. rate 18, height 5' 2.5" (1.588 m), weight 72.3 kg, SpO2 97 %.  General: Pt awake/alert/oriented x4 in No acute distress.  Happy.  Smiling.  Gave me a huge hug. Eyes: PERRL, normal EOM.  Sclera clear.  No icterus Neuro: CN II-XII intact w/o focal sensory/motor deficits. Lymph: No head/neck/groin lymphadenopathy Psych:  No delerium/psychosis/paranoia HENT: Normocephalic, Mucus membranes moist.  No thrush Neck: Supple, No tracheal deviation Chest: No chest wall pain w good excursion CV:  Pulses intact.  Regular rhythm MS: Normal AROM mjr joints.  No obvious deformity  Abdomen: Soft.  Nondistended.  Nontender.  No evidence of peritonitis.  No incarcerated hernias.  Drain with mostly serous slightly serosanguineous output.  Ext:  SCDs BLE.  No mjr edema.  No cyanosis Skin: No petechiae / purpura   Disposition:   Follow-up Information    Michael Boston, MD. Schedule an appointment as soon as possible for a visit in 3 weeks.   Specialty:  General Surgery Why:  To follow up after your operation, To follow up after your hospital stay Contact information: [address] [place] [address] [phone]        [facility], [place]. Call in 1 week.   Specialty:  General Surgery Why:  To have your drain removed & incisions re-checked Contact information: [address] [place]       Herminio Commons, MD. Call in 2 week(s).   Specialty:  Cardiology Why:  To follow up on NON STEMI heart attack with rercurrent atrial fibrillation irregular heart rate requiring amiodarone and anticoagulation.  Discuss possible cardioversion  as recommended by [facility] cardiologist, Dr. Donnella Bi. Contact information: [place] [address] [phone]           Discharge disposition: 01-Home or Self Care       Discharge Instructions    Call MD for:   Complete by:  As directed    Temperature > 101.87F   Call MD for:  extreme fatigue   Complete by:  As directed    Call MD for:  hives   Complete by:  As directed    Call MD for:  persistant nausea and vomiting   Complete by:  As directed    Call MD for:  redness, tenderness, or signs of infection (pain, swelling, redness, odor or green/yellow discharge around incision site)   Complete by:  As directed    Call MD for:  severe uncontrolled pain   Complete by:  As directed    Diet general   Complete by:  As directed    SEE ESOPHAGEAL SURGERY DIET INSTRUCTIONS  We using usually start you out on a pureed (blenderized) diet. Expect some sticking with swallowing over the next 1-2 months.   This is due to swelling around your esophagus at the wrap & hiatal diaphragm repair.  It will gradually ease off over the next few months.   Discharge instructions   Complete by:  As directed    Please see discharge instruction sheets.   Also refer to any handouts/printouts that may have been given from the [place] surgery office (  if you visited [place] there before surgery) Please call our office if you have any questions or concerns [phone]   Discharge wound care:   Complete by:  As directed    Remove dressing around plastic tube drain and replace with Band-Aid every day.  Anticipate coming back to the office in 7-10 days once the output of the drain is less than 50 mL a day x2 days.  Shower every day.  You do not need to replace dressings over the closed incisions unless you feel more comfortable with a Band-Aid covering it.   Please call our office [phone] if you have further questions.   Driving Restrictions   Complete by:  As directed    No  driving until off narcotics and can safely swerve away without pain during an emergency   Increase activity slowly   Complete by:  As directed    Lifting restrictions   Complete by:  As directed    Avoid heavy lifting initially, <20 pounds at first.   Do not push through pain.   You have no specific weight limit: If it hurts to do, DON'T DO IT.    If you feel no pain, you are not injuring anything.  Pain will protect you from injury.   Coughing and sneezing are far more stressful to your incision than any lifting.   Avoid resuming heavy lifting (>50 pounds) or other intense activity until off all narcotic pain medications.   When want to exercise more, give yourself 2 weeks to gradually get back to full intense exercise/activity.   May shower / Bathe   Complete by:  As directed    [place].  It is fine for dressings or wounds to be washed/rinsed.  Use gentle soap & water.  This will help the incisions and/or wounds get clean & minimize infection.   May walk up steps   Complete by:  As directed    Sexual Activity Restrictions   Complete by:  As directed    Sexual activity as tolerated.  Do not push through pain.  Pain will protect you from injury.   Walk with assistance   Complete by:  As directed    Walk over an hour a day.  May use a walker/cane/companion to help with balance and stamina.      Allergies as of 06/11/2018      Reactions   Sulfamethoxazole Rash      Medication List    STOP taking these medications   amLODipine 5 MG tablet Commonly known as:  NORVASC   ondansetron 8 MG tablet Commonly known as:  ZOFRAN     TAKE these medications   albuterol 108 (90 Base) MCG/ACT inhaler Commonly known as:  PROVENTIL HFA;VENTOLIN HFA Inhale 2 puffs into the lungs every 6 (six) hours as needed for wheezing or shortness of breath.   ALPRAZolam 0.25 MG tablet Commonly known as:  XANAX TAKEK 1/2 TO 1 TABLET 3 TIMES DAILY AS NEEDED What changed:    how much to take   how to take this  when to take this  reasons to take this  additional instructions   amiodarone 400 MG tablet Commonly known as:  PACERONE Take 1 tablet (400 mg total) by mouth 2 (two) times daily.   apixaban 5 MG Tabs tablet Commonly known as:  ELIQUIS Take 1 tablet (5 mg total) by mouth 2 (two) times daily.   citalopram 40 MG tablet Commonly known as:  CELEXA TAKE  ONE (1) TABLET EACH DAY What changed:    how much to take  how to take this  when to take this  additional instructions   denosumab 60 MG/ML Sosy injection Commonly known as:  PROLIA Inject 60 mg into the skin every 6 (six) months.   esomeprazole 40 MG capsule Commonly known as:  NEXIUM TAKE ONE (1) CAPSULE EACH DAY What changed:    how much to take  how to take this  when to take this  additional instructions   ferrous sulfate 325 (65 FE) MG EC tablet Take 325 mg by mouth daily with breakfast.   fluticasone 50 MCG/ACT nasal spray Commonly known as:  FLONASE USE 2 SPRAYS IN EACH NOSTRIL DAILY   furosemide 40 MG tablet Commonly known as:  LASIX Take 40 mg by mouth as needed for fluid.   hydrALAZINE 50 MG tablet Commonly known as:  APRESOLINE TAKE ONE (1) TABLET THREE (3) TIMES EACH DAY What changed:    how much to take  how to take this  when to take this  additional instructions   isosorbide mononitrate 30 MG 24 hr tablet Commonly known as:  IMDUR Take 1 tablet (30 mg total) by mouth every morning.   levothyroxine 50 MCG tablet Commonly known as:  SYNTHROID, LEVOTHROID TAKE ONE TABLET EACH MORNING BEFORE BREAKFAST What changed:    how much to take  how to take this  when to take this  additional instructions   loratadine 10 MG tablet Commonly known as:  CLARITIN Take 1 tablet (10 mg total) by mouth at bedtime.   losartan 100 MG tablet Commonly known as:  COZAAR TAKE ONE (1) TABLET EACH DAY What changed:    how much to take  how to take this  when to take  this  additional instructions   Meclizine HCl 25 MG Chew TAKE 1 TABLET 3 TIMES DAILY AS NEEDED FOR DIZZINESS What changed:  See the new instructions.   metoprolol succinate 25 MG 24 hr tablet Commonly known as:  TOPROL-XL Take 0.5 tablets (12.5 mg total) by mouth daily.   montelukast 10 MG tablet Commonly known as:  SINGULAIR Take 1 tablet (10 mg total) by mouth at bedtime.   nitroGLYCERIN 0.4 MG SL tablet Commonly known as:  NITROSTAT Place 1 tablet (0.4 mg total) under the tongue every 5 (five) minutes as needed for chest pain. Reported on 04/26/2015   nystatin powder Commonly known as:  MYCOSTATIN/NYSTOP Apply topically 4 (four) times daily. What changed:    how much to take  when to take this  reasons to take this   ondansetron 4 MG disintegrating tablet Commonly known as:  ZOFRAN-ODT Take 1 tablet (4 mg total) by mouth every 6 (six) hours as needed for nausea or vomiting.   polyethylene glycol powder powder Commonly known as:  GaviLAX MIX 17 GRAMS IN 80Z OF WATER/JUICE AND DRINK TWICE DAILY What changed:    how much to take  how to take this  when to take this  additional instructions   potassium chloride 10 MEQ tablet Commonly known as:  K-DUR TAKE ONE (1) TABLET EACH DAY What changed:  See the new instructions.   rosuvastatin 10 MG tablet Commonly known as:  CRESTOR TAKE ONE (1) TABLET EACH DAY What changed:    how much to take  how to take this  when to take this  additional instructions   SYSTANE BALANCE OP Apply 1 drop to eye daily as needed (dry  eyes).   traMADol 50 MG tablet Commonly known as:  ULTRAM Take 1 tablet (50 mg total) by mouth every 12 (twelve) hours as needed. What changed:    when to take this  reasons to take this   traZODone 100 MG tablet Commonly known as:  DESYREL Take 1 tablet (100 mg total) by mouth at bedtime.   Vitamin D (Ergocalciferol) 1.25 MG (50000 UT) Caps capsule Commonly known as:  DRISDOL TAKE  1 CAPSULE ON TUESDAYS What changed:    how much to take  how to take this  when to take this  additional instructions   zolpidem 10 MG tablet Commonly known as:  AMBIEN Take 1 tablet (10 mg total) by mouth at bedtime. What changed:    when to take this  reasons to take this            Discharge Care Instructions  (From admission, onward)         Start     Ordered   06/11/18 0000  Discharge wound care:    Comments:  Remove dressing around plastic tube drain and replace with Band-Aid every day.  Anticipate coming back to the office in 7-10 days once the output of the drain is less than 50 mL a day x2 days.  Shower every day.  You do not need to replace dressings over the closed incisions unless you feel more comfortable with a Band-Aid covering it.   Please call our office [phone] if you have further questions.   06/11/18 0715          Significant Diagnostic Studies:  Results for orders placed or performed during the hospital encounter of 06/05/18 (from the past 72 hour(s))  CBC     Status: Abnormal   Collection Time: 06/08/18  9:13 AM  Result Value Ref Range   WBC 12.2 (H) 4.0 - 10.5 K/uL   RBC 4.21 3.87 - 5.11 MIL/uL   Hemoglobin 11.0 (L) 12.0 - 15.0 g/dL   HCT 35.1 (L) 36.0 - 46.0 %   MCV 83.4 80.0 - 100.0 fL   MCH 26.1 26.0 - 34.0 pg   MCHC 31.3 30.0 - 36.0 g/dL   RDW 15.2 11.5 - 15.5 %   Platelets 177 150 - 400 K/uL   nRBC 0.0 0.0 - 0.2 %    Comment: Performed at [facility], [place] [address], [place], [place] [address]  Troponin I - Now Then Q6H     Status: Abnormal   Collection Time: 06/08/18  9:13 AM  Result Value Ref Range   Troponin I 5.01 (HH) <0.03 ng/mL    Comment: CRITICAL RESULT CALLED TO, READ BACK BY AND VERIFIED WITH: KIMBLE,M RN @ 4765 06/08/18 LEONARD,A Performed at [facility], [place] [address] [place], [place] [address]   Basic metabolic panel     Status: Abnormal   Collection Time: 06/08/18  9:13 AM  Result Value  Ref Range   Sodium 133 (L) 135 - 145 mmol/L   Potassium 3.3 (L) 3.5 - 5.1 mmol/L   Chloride 100 98 - 111 mmol/L   CO2 26 22 - 32 mmol/L   Glucose, Bld 137 (H) 70 - 99 mg/dL   BUN 12 8 - 23 mg/dL   Creatinine, Ser 1.22 (H) 0.44 - 1.00 mg/dL   Calcium 7.8 (L) 8.9 - 10.3 mg/dL   GFR calc non Af Amer 42 (L) >60 mL/min   GFR calc Af Amer 48 (L) >60 mL/min  Anion gap 7 5 - 15    Comment: Performed at [facility] [address] [place], [place] [address]  TSH     Status: None   Collection Time: 06/08/18  9:13 AM  Result Value Ref Range   TSH 1.950 0.350 - 4.500 uIU/mL    Comment: Performed by a 3rd Generation assay with a functional sensitivity of <=0.01 uIU/mL. Performed at [facility], [place] [address] [place], [place] [address]   Troponin I - Now Then Q6H     Status: Abnormal   Collection Time: 06/08/18  2:12 PM  Result Value Ref Range   Troponin I 4.34 (HH) <0.03 ng/mL    Comment: CRITICAL VALUE NOTED.  VALUE IS CONSISTENT WITH PREVIOUSLY REPORTED AND CALLED VALUE. Performed at [facility], [place] [address], [place], [place] [address]   Troponin I - Now Then Q6H     Status: Abnormal   Collection Time: 06/08/18  9:02 PM  Result Value Ref Range   Troponin I 4.39 (HH) <0.03 ng/mL    Comment: CRITICAL VALUE NOTED.  VALUE IS CONSISTENT WITH PREVIOUSLY REPORTED AND CALLED VALUE. Performed at [facility], [place] [address] [place], [place] [address]   Basic metabolic panel     Status: Abnormal   Collection Time: 06/09/18  3:24 AM  Result Value Ref Range   Sodium 134 (L) 135 - 145 mmol/L   Potassium 4.2 3.5 - 5.1 mmol/L   Chloride 101 98 - 111 mmol/L   CO2 24 22 - 32 mmol/L   Glucose, Bld 120 (H) 70 - 99 mg/dL   BUN 11 8 - 23 mg/dL   Creatinine, Ser 1.20 (H) 0.44 - 1.00 mg/dL   Calcium 8.0 (L) 8.9 - 10.3 mg/dL   GFR calc non Af Amer 42 (L) >60 mL/min   GFR calc Af Amer 49 (L) >60 mL/min   Anion gap 9 5 - 15    Comment: Performed at [place] [place] [address], [place], [place] [address]  CBC     Status: Abnormal   Collection Time: 06/09/18  3:24 AM  Result Value Ref Range   WBC 10.8 (H) 4.0 - 10.5 K/uL   RBC 3.91 3.87 - 5.11 MIL/uL   Hemoglobin 10.7 (L) 12.0 - 15.0 g/dL   HCT 32.8 (L) 36.0 - 46.0 %   MCV 83.9 80.0 - 100.0 fL   MCH 27.4 26.0 - 34.0 pg   MCHC 32.6 30.0 - 36.0 g/dL   RDW 15.5 11.5 - 15.5 %   Platelets 198 150 - 400 K/uL   nRBC 0.0 0.0 - 0.2 %    Comment: Performed at [facility], [place] [address], [place], [place] [address]    No results found.  Past Medical History:  Diagnosis Date  . Anemia due to blood loss, chronic   . Anxiety state, unspecified   . Atrophy of left kidney   . Cancer ([place])    melanoma on back  . Cancer of right colon ([place]) 05/20/2017  . CKD (chronic kidney disease), stage III (HCC)      sees Dr. Lowanda Foster   . Colon cancer ([place]) 2019   stage 3  . Colostomy in place [facility])    in 2016  . Coronary artery disease cardiologist-  dr Bronson Ing   a. Moderate to severe coronary disease involving the left anterior descending artery and right coronary artery.  Sequential stenosis in the LAD is  significant.  Right coronary artery is moderate to severely likely nonischemic. Questionable small LVOT obstruction.  No Brockenbrough maneuver was performed.  Catheterization January 2013 w/ PTCA and DES x1 to pLAD b. 05/2014 MV no isch/infarct, EF 60%.  . Depressive disorder, not elsewhere classified   . Diastolic CHF, chronic [facility])    cardiologist-  dr Bronson Ing   pt. denies at preop. 06/07/18 - EF 60-65% on echo, no obvious dysfunction appreciated  . Diversion colitis 2016  . Epidural abscess L5- S1 03/01/2016  . Gastro-esophageal reflux disease without esophagitis 07/16/2007   Overview:  Overview:  Qualifier: Diagnosis of  By: Nelson-Smith CMA (AAMA), Dottie  Overview:  Overview:  Overview:  Qualifier: Diagnosis of  By: Nelson-Smith CMA (AAMA), Dottie   . GERD (gastroesophageal reflux disease)   .  H/O hiatal hernia   . History of [name]'s esophagus   . History of melanoma excision    back  . Hypertension   . Hypothyroidism   . Infection of spine ([place])    in hospial  . Macular degeneration    both eyes  . PAF (paroxysmal atrial fibrillation) ([place])   . Parastomal hernia   . Pneumonia   . PONV (postoperative nausea and vomiting)   . Pulmonary hypertension ([place])    PA systolic [id number] mmHg by echocardiogram 06-17-2015  PA pressure 05-08-2011 by cardiac catheterization -- no sig. pulm. htn)PA saturation 62% thermodilution cardiac index 2.0 thick cardiac index 2.4  . Pure hypercholesterolemia   . S/P drug eluting coronary stent placement 05/08/2011   x1 DES to proximal LAD  . Symptomatic cholelithiasis 07/18/2016  . Tubulovillous adenoma of colon   . Wears dentures    lower  . Wears glasses     Past Surgical History:  Procedure Laterality Date  . CARDIAC CATHETERIZATION Left 03/05/2015   Procedure: CENTRAL LINE INSERTION;  Surgeon: Aviva Signs, MD;  Location: [facility];  Service: General;  Laterality: Left;  . CATARACT EXTRACTION W/ INTRAOCULAR LENS  IMPLANT, BILATERAL  ~ 2002  . CHOLECYSTECTOMY N/A 07/18/2016   Procedure: LAPAROSCOPIC CHOLECYSTECTOMY WITH INTRAOPERATIVE CHOLANGIOGRAM;  Surgeon: [name], MD;  Location: [facility];  Service: General;  Laterality: N/A;  . COLECTOMY WITH COLOSTOMY CREATION/HARTMANN PROCEDURE N/A 03/05/2015   Procedure: PARTIAL COLECTOMY WITH COLOSTOMY CREATION/HARTMANN PROCEDURE;  Surgeon: Aviva Signs, MD;  Location: [facility];  Service: General;  Laterality: N/A;  . COLON SURGERY     colostomy bag  LLQ  . ESOPHAGEAL MANOMETRY N/A 06/06/2014   Procedure: ESOPHAGEAL MANOMETRY (EM);  Surgeon: Jerene Bears, MD;  Location: [facility] ENDOSCOPY;  Service: Gastroenterology;  Laterality: N/A;  . ESOPHAGOGASTRODUODENOSCOPY (EGD) WITH PROPOFOL N/A 08/04/2015   Procedure: ESOPHAGOGASTRODUODENOSCOPY (EGD) WITH PROPOFOL;  Surgeon: Mauri Pole, MD;  Location: [facility]  ENDOSCOPY;  Service: Endoscopy;  Laterality: N/A;  . HERNIA REPAIR  2019   left side/ removed mass  . IR GENERIC HISTORICAL  03/04/2016   IR LUMBAR DISC ASPIRATION W/IMG GUIDE 03/04/2016 Luanne Bras, MD [facility]-INTERV RAD  . LAPAROSCOPIC NISSEN FUNDOPLICATION N/A 6/71/2458   Procedure: LAPAROSCOPIC  LYSIS OF ADHESIONS, SEGMENTAL GASTRECTOMY, PARTIAL REDUCTION OF HERNIA;  Surgeon: [name], MD;  Location: [facility];  Service: General;  Laterality: N/A;  . LAPAROSCOPIC PARTIAL COLECTOMY N/A 05/07/2017   Procedure: LAPAROSCOPIC  CONVERTED TO OPEN RIGHT COLECTOMY, PARASTOMAL HERNIA  REPAIR;  Surgeon: Leighton Ruff, MD;  Location: [facility];  Service: General;  Laterality: N/A;  . NISSEN FUNDOPLICATION  0998  . PERCUTANEOUS CORONARY STENT INTERVENTION (PCI-S) N/A 05/08/2011  Procedure: PERCUTANEOUS CORONARY STENT INTERVENTION (PCI-S);  Surgeon: Wellington Hampshire, MD;  Location: [facility] CATH LAB;  Service: Cardiovascular;  Laterality: N/A;  . ROTATOR CUFF REPAIR  ~ 2001   right  . SHOULDER ARTHROSCOPY  ~ 2004; 2005   left; "joint's wore out"  . TONSILLECTOMY  ~ 1944  . TUBAL LIGATION  1972    Social History   Socioeconomic History  . Marital status: Divorced    Spouse name: Not on file  . Number of children: 3  . Years of education: Not on file  . Highest education level: Not on file  Occupational History  . Occupation: Retired  Scientific laboratory technician  . Financial resource strain: Not on file  . Food insecurity:    Worry: Not on file    Inability: Not on file  . Transportation needs:    Medical: Not on file    Non-medical: Not on file  Tobacco Use  . Smoking status: Former Smoker    Packs/day: 1.00    Years: 20.00    Pack years: 20.00    Types: Cigarettes    Start date: 10/12/1958    Last attempt to quit: 04/26/1988    Years since quitting: 30.1  . Smokeless tobacco: Never Used  Substance and Sexual Activity  . Alcohol use: No    Alcohol/week: 0.0 standard drinks  . Drug use: No  . Sexual  activity: Not Currently  Lifestyle  . Physical activity:    Days per week: Not on file    Minutes per session: Not on file  . Stress: Not on file  Relationships  . Social connections:    Talks on phone: Not on file    Gets together: Not on file    Attends religious service: Not on file    Active member of club or organization: Not on file    Attends meetings of clubs or organizations: Not on file    Relationship status: Not on file  . Intimate partner violence:    Fear of current or ex partner: Not on file    Emotionally abused: Not on file    Physically abused: Not on file    Forced sexual activity: Not on file  Other Topics Concern  . Not on file  Social History Narrative   Divorced.  Lives alone.  Ambulates with a walker.     Family History  Problem Relation Age of Onset  . Heart attack Father   . Hypertension Father   . Hypertension Mother   . Skin cancer Brother        melanoma  . Atrial fibrillation Sister   . Skin cancer Sister   . Colon cancer Neg Hx   . Esophageal cancer Neg Hx   . Rectal cancer Neg Hx   . Stomach cancer Neg Hx     Current Facility-Administered Medications  Medication Dose Route Frequency Provider Last Rate Last Dose  . acetaminophen (TYLENOL) tablet 1,000 mg  1,000 mg Oral Tor Netters, MD   1,000 mg at 06/11/18 0559  . albuterol (PROVENTIL) (2.5 MG/3ML) 0.083% nebulizer solution 3 mL  3 mL Inhalation Q6H PRN Michael Boston, MD      . ALPRAZolam Duanne Moron) tablet 0.125-0.25 mg  0.125-0.25 mg Oral TID PRN Michael Boston, MD   0.25 mg at 06/10/18 2136  . alum & mag hydroxide-simeth (MAALOX/MYLANTA) 200-200-20 MG/5ML suspension 30 mL  30 mL Oral Q6H PRN Michael Boston, MD      . amiodarone (NEXTERONE PREMIX)  360-4.14 MG/200ML-% (1.8 mg/mL) IV infusion  30 mg/hr Intravenous Continuous Michael Boston, MD   Stopped at 06/10/18 1100  . amiodarone (PACERONE) tablet 400 mg  400 mg Oral BID Lorretta Harp, MD   400 mg at 06/10/18 2136  . apixaban  (ELIQUIS) tablet 5 mg  5 mg Oral BID Michael Boston, MD   5 mg at 06/10/18 2137  . bisacodyl (DULCOLAX) suppository 10 mg  10 mg Rectal Daily PRN Michael Boston, MD      . diphenhydrAMINE (BENADRYL) 12.5 MG/5ML elixir 12.5 mg  12.5 mg Oral Q6H PRN Michael Boston, MD       Or  . diphenhydrAMINE (BENADRYL) injection 12.5 mg  12.5 mg Intravenous Q6H PRN Michael Boston, MD      . enalaprilat (VASOTEC) injection 0.625-1.25 mg  0.625-1.25 mg Intravenous Q6H PRN Michael Boston, MD      . ferrous sulfate tablet 325 mg  325 mg Oral Q breakfast Michael Boston, MD   325 mg at 06/10/18 0834  . fluticasone (FLONASE) 50 MCG/ACT nasal spray 2 spray  2 spray Each Nare Daily Michael Boston, MD   2 spray at 06/10/18 0839  . gabapentin (NEURONTIN) capsule 200 mg  200 mg Oral TID Michael Boston, MD   200 mg at 06/10/18 2136  . guaiFENesin-dextromethorphan (ROBITUSSIN DM) 100-10 MG/5ML syrup 10 mL  10 mL Oral Q4H PRN Michael Boston, MD      . hydrALAZINE (APRESOLINE) injection 5-20 mg  5-20 mg Intravenous Q4H PRN Michael Boston, MD      . hydrocortisone (ANUSOL-HC) 2.5 % rectal cream 1 application  1 application Topical QID PRN Michael Boston, MD      . hydrocortisone cream 1 % 1 application  1 application Topical TID PRN Michael Boston, MD      . HYDROmorphone (DILAUDID) injection 0.5-2 mg  0.5-2 mg Intravenous Q2H PRN Michael Boston, MD      . isosorbide mononitrate (IMDUR) 24 hr tablet 30 mg  30 mg Oral q morning - Lorenza Burton, MD   30 mg at 06/10/18 7408  . levothyroxine (SYNTHROID, LEVOTHROID) tablet 50 mcg  50 mcg Oral QAC breakfast Michael Boston, MD   50 mcg at 06/11/18 0559  . lip balm (CARMEX) ointment 1 application  1 application Topical BID Michael Boston, MD   1 application at 14/48/18 2139  . loratadine (CLARITIN) tablet 10 mg  10 mg Oral Ardeen Fillers, MD   10 mg at 06/10/18 2139  . meclizine (ANTIVERT) tablet 25 mg  25 mg Oral TID PRN Michael Boston, MD      . MEDLINE mouth rinse  15 mL Mouth Rinse BID  Michael Boston, MD   15 mL at 06/10/18 2140  . menthol-cetylpyridinium (CEPACOL) lozenge 3 mg  1 lozenge Oral PRN Michael Boston, MD      . methocarbamol (ROBAXIN) tablet 750 mg  750 mg Oral Q6H PRN Michael Boston, MD   750 mg at 06/06/18 1950  . metoprolol succinate (TOPROL-XL) 24 hr tablet 25 mg  25 mg Oral Daily Lorretta Harp, MD      . metoprolol tartrate (LOPRESSOR) injection 5 mg  5 mg Intravenous Q6H PRN Michael Boston, MD   5 mg at 06/06/18 2152  . montelukast (SINGULAIR) tablet 10 mg  10 mg Oral Ardeen Fillers, MD   10 mg at 06/10/18 2136  . nitroGLYCERIN (NITROSTAT) SL tablet 0.4 mg  0.4 mg Sublingual Q5 min PRN Michael Boston, MD      .  nystatin (MYCOSTATIN/NYSTOP) topical powder 1 Bottle  1 Bottle Topical PRN Michael Boston, MD      . ondansetron (ZOFRAN-ODT) disintegrating tablet 4 mg  4 mg Oral Q6H PRN Michael Boston, MD   4 mg at 06/05/18 1702   Or  . ondansetron (ZOFRAN) injection 4 mg  4 mg Intravenous Q6H PRN Michael Boston, MD   4 mg at 06/06/18 1437  . phenol (CHLORASEPTIC) mouth spray 1-2 spray  1-2 spray Mouth/Throat PRN Michael Boston, MD      . polyethylene glycol (MIRALAX / GLYCOLAX) packet 17 g  17 g Oral Daily PRN Michael Boston, MD      . polyvinyl alcohol (LIQUIFILM TEARS) 1.4 % ophthalmic solution 1 drop  1 drop Both Eyes Daily PRN Michael Boston, MD      . prochlorperazine (COMPAZINE) tablet 10 mg  10 mg Oral Q6H PRN Michael Boston, MD       Or  . prochlorperazine (COMPAZINE) injection 5-10 mg  5-10 mg Intravenous Q6H PRN Michael Boston, MD      . rosuvastatin (CRESTOR) tablet 10 mg  10 mg Oral Daily Michael Boston, MD   10 mg at 06/10/18 0834  . simethicone (MYLICON) chewable tablet 40 mg  40 mg Oral Q6H PRN Michael Boston, MD      . Vitamin D (Ergocalciferol) (DRISDOL) capsule 50,000 Units  50,000 Units Oral Q Gearldine Bienenstock, MD   50,000 Units at 06/09/18 1124  . zolpidem (AMBIEN) tablet 5 mg  5 mg Oral QHS PRN Michael Boston, MD         Allergies  Allergen  Reactions  . Sulfamethoxazole Rash    Signed: Morton Peters, MD, FACS, MASCRS Gastrointestinal and Min[facility]ally [facility]    [address] [place], [place] [address] [phone] Main / Paging [phone] Fax   06/11/2018, 7:23 AM

## 2018-06-11 NOTE — Plan of Care (Signed)
  Problem: Health Behavior/Discharge Planning: Goal: Ability to manage health-related needs will improve Outcome: Progressing   Problem: Clinical Measurements: Goal: Will remain free from infection Outcome: Progressing   

## 2018-06-11 NOTE — Care Management Important Message (Signed)
Important Message  Patient Details  Name: Adrieana Fennelly Tromp MRN: [id number] Date of Birth: 1935/02/05   Medicare Important Message Given:  Yes    Barb Merino Caydance Kuehnle 06/11/2018, 2:34 PM

## 2018-06-14 ENCOUNTER — Encounter (HOSPITAL_COMMUNITY): Payer: Self-pay

## 2018-06-14 ENCOUNTER — Emergency Department (HOSPITAL_COMMUNITY): Payer: Medicare Other

## 2018-06-14 ENCOUNTER — Other Ambulatory Visit: Payer: Self-pay

## 2018-06-14 ENCOUNTER — Inpatient Hospital Stay (HOSPITAL_COMMUNITY)
Admission: EM | Admit: 2018-06-14 | Discharge: 2018-06-17 | DRG: 281 | Disposition: A | Discharge status: Home Health | Payer: Medicare Other | Attending: Internal Medicine | Admitting: Internal Medicine

## 2018-06-14 DIAGNOSIS — N179 Acute kidney failure, unspecified: Secondary | ICD-10-CM

## 2018-06-14 DIAGNOSIS — Z9049 Acquired absence of other specified parts of digestive tract: Secondary | ICD-10-CM

## 2018-06-14 DIAGNOSIS — I2510 Atherosclerotic heart disease of native coronary artery without angina pectoris: Secondary | ICD-10-CM | POA: Diagnosis present

## 2018-06-14 DIAGNOSIS — I130 Hypertensive heart and chronic kidney disease with heart failure and stage 1 through stage 4 chronic kidney disease, or unspecified chronic kidney disease: Secondary | ICD-10-CM | POA: Diagnosis present

## 2018-06-14 DIAGNOSIS — D638 Anemia in other chronic diseases classified elsewhere: Secondary | ICD-10-CM | POA: Diagnosis present

## 2018-06-14 DIAGNOSIS — Z955 Presence of coronary angioplasty implant and graft: Secondary | ICD-10-CM

## 2018-06-14 DIAGNOSIS — Z79891 Long term (current) use of opiate analgesic: Secondary | ICD-10-CM

## 2018-06-14 DIAGNOSIS — Z85038 Personal history of other malignant neoplasm of large intestine: Secondary | ICD-10-CM

## 2018-06-14 DIAGNOSIS — I5033 Acute on chronic diastolic (congestive) heart failure: Secondary | ICD-10-CM | POA: Diagnosis not present

## 2018-06-14 DIAGNOSIS — I272 Pulmonary hypertension, unspecified: Secondary | ICD-10-CM | POA: Diagnosis not present

## 2018-06-14 DIAGNOSIS — I5032 Chronic diastolic (congestive) heart failure: Secondary | ICD-10-CM | POA: Diagnosis present

## 2018-06-14 DIAGNOSIS — E039 Hypothyroidism, unspecified: Secondary | ICD-10-CM | POA: Diagnosis present

## 2018-06-14 DIAGNOSIS — N261 Atrophy of kidney (terminal): Secondary | ICD-10-CM | POA: Diagnosis present

## 2018-06-14 DIAGNOSIS — I97191 Other postprocedural cardiac functional disturbances following other surgery: Secondary | ICD-10-CM | POA: Diagnosis present

## 2018-06-14 DIAGNOSIS — Z9889 Other specified postprocedural states: Secondary | ICD-10-CM

## 2018-06-14 DIAGNOSIS — I4891 Unspecified atrial fibrillation: Secondary | ICD-10-CM

## 2018-06-14 DIAGNOSIS — F329 Major depressive disorder, single episode, unspecified: Secondary | ICD-10-CM | POA: Diagnosis present

## 2018-06-14 DIAGNOSIS — R0602 Shortness of breath: Secondary | ICD-10-CM | POA: Diagnosis present

## 2018-06-14 DIAGNOSIS — N189 Chronic kidney disease, unspecified: Secondary | ICD-10-CM

## 2018-06-14 DIAGNOSIS — Z7901 Long term (current) use of anticoagulants: Secondary | ICD-10-CM | POA: Diagnosis not present

## 2018-06-14 DIAGNOSIS — E860 Dehydration: Secondary | ICD-10-CM | POA: Diagnosis present

## 2018-06-14 DIAGNOSIS — N183 Chronic kidney disease, stage 3 (moderate): Secondary | ICD-10-CM | POA: Diagnosis present

## 2018-06-14 DIAGNOSIS — D649 Anemia, unspecified: Secondary | ICD-10-CM

## 2018-06-14 DIAGNOSIS — Z87891 Personal history of nicotine dependence: Secondary | ICD-10-CM

## 2018-06-14 DIAGNOSIS — K219 Gastro-esophageal reflux disease without esophagitis: Secondary | ICD-10-CM | POA: Diagnosis present

## 2018-06-14 DIAGNOSIS — I471 Supraventricular tachycardia: Secondary | ICD-10-CM | POA: Diagnosis present

## 2018-06-14 DIAGNOSIS — E876 Hypokalemia: Secondary | ICD-10-CM | POA: Diagnosis not present

## 2018-06-14 DIAGNOSIS — I4819 Other persistent atrial fibrillation: Secondary | ICD-10-CM | POA: Diagnosis present

## 2018-06-14 DIAGNOSIS — E440 Moderate protein-calorie malnutrition: Secondary | ICD-10-CM | POA: Diagnosis present

## 2018-06-14 DIAGNOSIS — I10 Essential (primary) hypertension: Secondary | ICD-10-CM | POA: Diagnosis not present

## 2018-06-14 DIAGNOSIS — I493 Ventricular premature depolarization: Secondary | ICD-10-CM | POA: Diagnosis present

## 2018-06-14 DIAGNOSIS — Z8661 Personal history of infections of the central nervous system: Secondary | ICD-10-CM

## 2018-06-14 DIAGNOSIS — Z79899 Other long term (current) drug therapy: Secondary | ICD-10-CM | POA: Diagnosis not present

## 2018-06-14 DIAGNOSIS — I214 Non-ST elevation (NSTEMI) myocardial infarction: Secondary | ICD-10-CM | POA: Diagnosis present

## 2018-06-14 DIAGNOSIS — Z6828 Body mass index (BMI) 28.0-28.9, adult: Secondary | ICD-10-CM | POA: Diagnosis not present

## 2018-06-14 DIAGNOSIS — Z933 Colostomy status: Secondary | ICD-10-CM

## 2018-06-14 DIAGNOSIS — Z882 Allergy status to sulfonamides status: Secondary | ICD-10-CM

## 2018-06-14 DIAGNOSIS — F411 Generalized anxiety disorder: Secondary | ICD-10-CM | POA: Diagnosis present

## 2018-06-14 DIAGNOSIS — D500 Iron deficiency anemia secondary to blood loss (chronic): Secondary | ICD-10-CM | POA: Diagnosis present

## 2018-06-14 DIAGNOSIS — Z79890 Hormone replacement therapy (postmenopausal): Secondary | ICD-10-CM

## 2018-06-14 DIAGNOSIS — Z85820 Personal history of malignant melanoma of skin: Secondary | ICD-10-CM

## 2018-06-14 DIAGNOSIS — K2270 Barrett's esophagus without dysplasia: Secondary | ICD-10-CM | POA: Diagnosis present

## 2018-06-14 DIAGNOSIS — R531 Weakness: Secondary | ICD-10-CM

## 2018-06-14 DIAGNOSIS — Z808 Family history of malignant neoplasm of other organs or systems: Secondary | ICD-10-CM

## 2018-06-14 DIAGNOSIS — Z7951 Long term (current) use of inhaled steroids: Secondary | ICD-10-CM

## 2018-06-14 DIAGNOSIS — Z8249 Family history of ischemic heart disease and other diseases of the circulatory system: Secondary | ICD-10-CM

## 2018-06-14 LAB — CBC WITH DIFFERENTIAL/PLATELET
Abs Immature Granulocytes: 0.09 10*3/uL — ABNORMAL HIGH (ref 0.00–0.07)
Basophils Absolute: 0 10*3/uL (ref 0.0–0.1)
Basophils Relative: 0 %
EOS PCT: 2 %
Eosinophils Absolute: 0.2 10*3/uL (ref 0.0–0.5)
HCT: 30.7 % — ABNORMAL LOW (ref 36.0–46.0)
HEMOGLOBIN: 9.1 g/dL — AB (ref 12.0–15.0)
Immature Granulocytes: 1 %
Lymphocytes Relative: 10 %
Lymphs Abs: 0.9 10*3/uL (ref 0.7–4.0)
MCH: 25.7 pg — ABNORMAL LOW (ref 26.0–34.0)
MCHC: 29.6 g/dL — AB (ref 30.0–36.0)
MCV: 86.7 fL (ref 80.0–100.0)
Monocytes Absolute: 0.4 10*3/uL (ref 0.1–1.0)
Monocytes Relative: 5 %
Neutro Abs: 7.8 10*3/uL — ABNORMAL HIGH (ref 1.7–7.7)
Neutrophils Relative %: 82 %
Platelets: 332 10*3/uL (ref 150–400)
RBC: 3.54 MIL/uL — ABNORMAL LOW (ref 3.87–5.11)
RDW: 16.4 % — ABNORMAL HIGH (ref 11.5–15.5)
WBC: 9.5 10*3/uL (ref 4.0–10.5)
nRBC: 0 % (ref 0.0–0.2)

## 2018-06-14 LAB — URINALYSIS, ROUTINE W REFLEX MICROSCOPIC
BILIRUBIN URINE: NEGATIVE
Bacteria, UA: NONE SEEN
Glucose, UA: NEGATIVE mg/dL
Hgb urine dipstick: NEGATIVE
Ketones, ur: NEGATIVE mg/dL
Leukocytes,Ua: NEGATIVE
NITRITE: NEGATIVE
PH: 5 (ref 5.0–8.0)
Protein, ur: 30 mg/dL — AB
Specific Gravity, Urine: 1.028 (ref 1.005–1.030)

## 2018-06-14 LAB — COMPREHENSIVE METABOLIC PANEL
ALT: 21 U/L (ref 0–44)
AST: 15 U/L (ref 15–41)
Albumin: 2.3 g/dL — ABNORMAL LOW (ref 3.5–5.0)
Alkaline Phosphatase: 58 U/L (ref 38–126)
Anion gap: 6 (ref 5–15)
BUN: 15 mg/dL (ref 8–23)
CO2: 23 mmol/L (ref 22–32)
CREATININE: 1.6 mg/dL — AB (ref 0.44–1.00)
Calcium: 7.9 mg/dL — ABNORMAL LOW (ref 8.9–10.3)
Chloride: 108 mmol/L (ref 98–111)
GFR calc Af Amer: 35 mL/min — ABNORMAL LOW (ref >60)
GFR, EST NON AFRICAN AMERICAN: 30 mL/min — AB (ref >60)
Glucose, Bld: 110 mg/dL — ABNORMAL HIGH (ref 70–99)
Potassium: 3.5 mmol/L (ref 3.5–5.1)
Sodium: 137 mmol/L (ref 135–145)
Total Bilirubin: 0.7 mg/dL (ref 0.3–1.2)
Total Protein: 5.7 g/dL — ABNORMAL LOW (ref 6.5–8.1)

## 2018-06-14 LAB — I-STAT TROPONIN, ED: Troponin i, poc: 1.16 ng/mL (ref 0.00–0.08)

## 2018-06-14 LAB — BRAIN NATRIURETIC PEPTIDE: B Natriuretic Peptide: 570.9 pg/mL — ABNORMAL HIGH (ref 0.0–100.0)

## 2018-06-14 LAB — MAGNESIUM: Magnesium: 2 mg/dL (ref 1.7–2.4)

## 2018-06-14 MED ORDER — MECLIZINE HCL 25 MG PO TABS: 25.0000 mg | ORAL_TABLET | Freq: Three times a day (TID) | ORAL | Status: DC | PRN

## 2018-06-14 MED ORDER — FUROSEMIDE 40 MG PO TABS: 40.0000 mg | ORAL_TABLET | Freq: Every day | ORAL | Status: DC | PRN

## 2018-06-14 MED ORDER — PANTOPRAZOLE SODIUM 40 MG PO TBEC
40.0000 mg | DELAYED_RELEASE_TABLET | Freq: Every day | ORAL | Status: DC
  Administered 2018-06-15 – 2018-06-17 (×3): 40 mg via ORAL
  Filled 2018-06-14 (×3): qty 1

## 2018-06-14 MED ORDER — ROSUVASTATIN CALCIUM 5 MG PO TABS
10.0000 mg | ORAL_TABLET | Freq: Every day | ORAL | Status: DC
  Administered 2018-06-15 – 2018-06-17 (×3): 10 mg via ORAL
  Filled 2018-06-14 (×3): qty 2

## 2018-06-14 MED ORDER — LOSARTAN POTASSIUM 50 MG PO TABS
50.0000 mg | ORAL_TABLET | Freq: Every day | ORAL | Status: DC
  Administered 2018-06-15 – 2018-06-17 (×3): 50 mg via ORAL
  Filled 2018-06-14 (×3): qty 1

## 2018-06-14 MED ORDER — SODIUM CHLORIDE 0.9 % IV SOLN
INTRAVENOUS | Status: DC
  Administered 2018-06-14: via INTRAVENOUS

## 2018-06-14 MED ORDER — ISOSORBIDE MONONITRATE ER 30 MG PO TB24
30.0000 mg | ORAL_TABLET | Freq: Every morning | ORAL | Status: DC
  Administered 2018-06-15 – 2018-06-17 (×3): 30 mg via ORAL
  Filled 2018-06-14 (×3): qty 1

## 2018-06-14 MED ORDER — POLYVINYL ALCOHOL 1.4 % OP SOLN: Freq: Every day | OPHTHALMIC | Status: DC | PRN

## 2018-06-14 MED ORDER — VITAMIN D (ERGOCALCIFEROL) 1.25 MG (50000 UNIT) PO CAPS
50000.0000 [IU] | ORAL_CAPSULE | ORAL | Status: DC
  Filled 2018-06-14: qty 1

## 2018-06-14 MED ORDER — POLYETHYLENE GLYCOL 3350 17 G PO PACK
17.0000 g | PACK | Freq: Every day | ORAL | Status: DC
  Administered 2018-06-15: 17 g via ORAL
  Filled 2018-06-14 (×3): qty 1

## 2018-06-14 MED ORDER — NYSTATIN 100000 UNIT/GM EX POWD: 1.0000 | Freq: Every day | CUTANEOUS | Status: DC | PRN

## 2018-06-14 MED ORDER — ALBUTEROL SULFATE (2.5 MG/3ML) 0.083% IN NEBU
2.5000 mg | INHALATION_SOLUTION | Freq: Four times a day (QID) | RESPIRATORY_TRACT | Status: DC | PRN
  Administered 2018-06-15 – 2018-06-17 (×4): 2.5 mg via RESPIRATORY_TRACT
  Filled 2018-06-14 (×4): qty 3

## 2018-06-14 MED ORDER — NITROGLYCERIN 0.4 MG SL SUBL: 0.4000 mg | SUBLINGUAL_TABLET | SUBLINGUAL | Status: DC | PRN

## 2018-06-14 MED ORDER — HYDRALAZINE HCL 50 MG PO TABS
50.0000 mg | ORAL_TABLET | Freq: Three times a day (TID) | ORAL | Status: DC
  Administered 2018-06-14 – 2018-06-17 (×8): 50 mg via ORAL
  Filled 2018-06-14 (×8): qty 1

## 2018-06-14 MED ORDER — FERROUS SULFATE 325 (65 FE) MG PO TABS
325.0000 mg | ORAL_TABLET | Freq: Every day | ORAL | Status: DC
  Administered 2018-06-15 – 2018-06-17 (×3): 325 mg via ORAL
  Filled 2018-06-14 (×3): qty 1

## 2018-06-14 MED ORDER — POTASSIUM CHLORIDE CRYS ER 10 MEQ PO TBCR
10.0000 meq | EXTENDED_RELEASE_TABLET | Freq: Every day | ORAL | Status: DC
  Administered 2018-06-15 – 2018-06-17 (×3): 10 meq via ORAL
  Filled 2018-06-14 (×3): qty 1

## 2018-06-14 MED ORDER — TRAMADOL HCL 50 MG PO TABS
50.0000 mg | ORAL_TABLET | Freq: Every day | ORAL | Status: DC | PRN
  Administered 2018-06-15: 50 mg via ORAL
  Filled 2018-06-14: qty 1

## 2018-06-14 MED ORDER — LORATADINE 10 MG PO TABS
10.0000 mg | ORAL_TABLET | Freq: Every day | ORAL | Status: DC
  Administered 2018-06-14 – 2018-06-16 (×3): 10 mg via ORAL
  Filled 2018-06-14 (×3): qty 1

## 2018-06-14 MED ORDER — MONTELUKAST SODIUM 10 MG PO TABS
10.0000 mg | ORAL_TABLET | Freq: Every day | ORAL | Status: DC
  Administered 2018-06-14 – 2018-06-16 (×3): 10 mg via ORAL
  Filled 2018-06-14 (×3): qty 1

## 2018-06-14 MED ORDER — AMIODARONE HCL 200 MG PO TABS
400.0000 mg | ORAL_TABLET | Freq: Two times a day (BID) | ORAL | Status: DC
  Administered 2018-06-14 – 2018-06-16 (×4): 400 mg via ORAL
  Filled 2018-06-14 (×4): qty 2

## 2018-06-14 MED ORDER — APIXABAN 5 MG PO TABS
5.0000 mg | ORAL_TABLET | Freq: Two times a day (BID) | ORAL | Status: DC
  Administered 2018-06-14 – 2018-06-17 (×6): 5 mg via ORAL
  Filled 2018-06-14 (×6): qty 1

## 2018-06-14 MED ORDER — ONDANSETRON HCL 4 MG PO TABS: 4.0000 mg | ORAL_TABLET | Freq: Four times a day (QID) | ORAL | Status: DC | PRN

## 2018-06-14 MED ORDER — METOPROLOL SUCCINATE ER 25 MG PO TB24
12.5000 mg | ORAL_TABLET | Freq: Every day | ORAL | Status: DC
  Administered 2018-06-15 – 2018-06-17 (×3): 12.5 mg via ORAL
  Filled 2018-06-14 (×3): qty 1

## 2018-06-14 MED ORDER — LEVOTHYROXINE SODIUM 50 MCG PO TABS
50.0000 ug | ORAL_TABLET | Freq: Every day | ORAL | Status: DC
  Administered 2018-06-15 – 2018-06-17 (×3): 50 ug via ORAL
  Filled 2018-06-14 (×3): qty 1

## 2018-06-14 MED ORDER — CITALOPRAM HYDROBROMIDE 20 MG PO TABS
40.0000 mg | ORAL_TABLET | Freq: Every day | ORAL | Status: DC
  Administered 2018-06-15 – 2018-06-17 (×3): 40 mg via ORAL
  Filled 2018-06-14 (×3): qty 2

## 2018-06-14 MED ORDER — DENOSUMAB 60 MG/ML ~~LOC~~ SOSY: 60.0000 mg | PREFILLED_SYRINGE | SUBCUTANEOUS | Status: DC

## 2018-06-14 MED ORDER — ONDANSETRON HCL 4 MG/2ML IJ SOLN: 4.0000 mg | Freq: Four times a day (QID) | INTRAMUSCULAR | Status: DC | PRN

## 2018-06-14 MED ORDER — SODIUM CHLORIDE 0.9 % IV BOLUS
500.0000 mL | Freq: Once | INTRAVENOUS | Status: AC
  Administered 2018-06-14: 500 mL via INTRAVENOUS

## 2018-06-14 MED ORDER — ONDANSETRON 4 MG PO TBDP: 4.0000 mg | ORAL_TABLET | Freq: Four times a day (QID) | ORAL | Status: DC | PRN

## 2018-06-14 MED ORDER — ALPRAZOLAM 0.25 MG PO TABS
0.1250 mg | ORAL_TABLET | Freq: Three times a day (TID) | ORAL | Status: DC | PRN
  Administered 2018-06-15 – 2018-06-16 (×4): 0.25 mg via ORAL
  Filled 2018-06-14 (×4): qty 1

## 2018-06-14 MED ORDER — TRAZODONE HCL 100 MG PO TABS
100.0000 mg | ORAL_TABLET | Freq: Every day | ORAL | Status: DC
  Administered 2018-06-14 – 2018-06-16 (×3): 100 mg via ORAL
  Filled 2018-06-14 (×3): qty 1

## 2018-06-14 MED ORDER — ZOLPIDEM TARTRATE 5 MG PO TABS: 5.0000 mg | ORAL_TABLET | Freq: Every evening | ORAL | Status: DC | PRN

## 2018-06-14 MED ORDER — FLUTICASONE PROPIONATE 50 MCG/ACT NA SUSP
2.0000 | Freq: Every day | NASAL | Status: DC
  Administered 2018-06-15 – 2018-06-17 (×3): 2 via NASAL
  Filled 2018-06-14: qty 16

## 2018-06-14 NOTE — ED Notes (Signed)
Pt's daughter took her purse, cell phone and other items home with her and will bring them tomorrow.

## 2018-06-14 NOTE — ED Notes (Signed)
Ambulated pt in pod with pulse ox. Pt started at 100% on RA and didn't drop below 96% while ambulating. Pt is working to breathe and her only complaint is that she feels weak. Pt expressing that she would like to go home. Angela Nevin RN and [place] PA notified

## 2018-06-14 NOTE — ED Notes (Signed)
Urine culture collected sent downstairs

## 2018-06-14 NOTE — ED Triage Notes (Signed)
Pt arrives with [facility] from home c/o increased shortness of breath since abdominal laparoscopic operation on 06/05/2018 at [facility]. Pt also has hx of afib and reports overall weakness. Pt states she is on Eliquis starting post-op. A&o x4.  EMS vitals: BP 111/76 HR 99 with PVCs O2 sat 96-98 on RA

## 2018-06-14 NOTE — ED Notes (Signed)
Langston Masker PA made aware of critical istat troponin level. ED-Lab

## 2018-06-14 NOTE — ED Notes (Signed)
pts daughter, Helene Kelp, would like a call with an update if pt is admitted or discharged if possible at [phone]

## 2018-06-14 NOTE — ED Notes (Signed)
ED TO INPATIENT HANDOFF REPORT  ED Nurse Name and Phone #: Angela Nevin RN  S Name/Age/Gender Haadiya I Kellenberger 84 y.o. female Room/Bed: 036C/036C  Code Status   Code Status: Prior  Home/SNF/Other Home Patient oriented to: self, place, time and situation Is this baseline? Yes   Triage Complete: Triage complete  Chief Complaint Weakness; SOB; AFIB; Post-op  Triage Note Pt arrives with [facility] EMS from home c/o increased shortness of breath since abdominal laparoscopic operation on 06/05/2018 at [facility]. Pt also has hx of afib and reports overall weakness. Pt states she is on Eliquis starting post-op. A&o x4.  EMS vitals: BP 111/76 HR 99 with PVCs O2 sat 96-98 on RA    Allergies Allergies  Allergen Reactions  . Sulfamethoxazole Rash    Level of Care/Admitting Diagnosis ED Disposition    ED Disposition Condition [facility] Area: [place] [100100]  Level of Care: Telemetry Cardiac [103]  Diagnosis: Weakness generalized [[id number]]  Admitting Physician: Elwyn Reach [[id number]]  Attending Physician: Elwyn Reach [[id number]]  Estimated length of stay: past midnight tomorrow  Certification:: I certify this patient will need inpatient services for at least 2 midnights  PT Class (Do Not Modify): Inpatient [101]  PT Acc Code (Do Not Modify): Private [1]       B Medical/Surgery History Past Medical History:  Diagnosis Date  . Anemia due to blood loss, chronic   . Anxiety state, unspecified   . Atrophy of left kidney   . Cancer ([place])    melanoma on back  . Cancer of right colon ([place]) 05/20/2017  . CKD (chronic kidney disease), stage III (HCC)      sees Dr. Lowanda Foster   . Colon cancer ([place]) 2019   stage 3  . Colostomy in place [facility])    in 2016  . Coronary artery disease cardiologist-  dr Bronson Ing   a. Moderate to severe coronary disease involving the left anterior descending artery and right coronary artery.  Sequential stenosis in the  LAD is significant.  Right coronary artery is moderate to severely likely nonischemic. Questionable small LVOT obstruction.  No Brockenbrough maneuver was performed.  Catheterization January 2013 w/ PTCA and DES x1 to pLAD b. 05/2014 MV no isch/infarct, EF 60%.  . Depressive disorder, not elsewhere classified   . Diastolic CHF, chronic [facility])    cardiologist-  dr Bronson Ing   pt. denies at preop. 06/07/18 - EF 60-65% on echo, no obvious dysfunction appreciated  . Diversion colitis 2016  . Epidural abscess L5- S1 03/01/2016  . Gastro-esophageal reflux disease without esophagitis 07/16/2007   Overview:  Overview:  Qualifier: Diagnosis of  By: Nelson-Smith CMA (AAMA), Dottie  Overview:  Overview:  Overview:  Qualifier: Diagnosis of  By: Nelson-Smith CMA (AAMA), Dottie   . GERD (gastroesophageal reflux disease)   . H/O hiatal hernia   . History of [name]'s esophagus   . History of melanoma excision    back  . Hypertension   . Hypothyroidism   . Infection of spine ([place])    in hospial  . Macular degeneration    both eyes  . PAF (paroxysmal atrial fibrillation) ([place])   . Parastomal hernia   . Pneumonia   . PONV (postoperative nausea and vomiting)   . Pulmonary hypertension ([place])    PA systolic [id number] mmHg by echocardiogram 06-17-2015  PA pressure 05-08-2011 by cardiac catheterization -- no sig. pulm. htn)PA saturation 62% thermodilution cardiac index 2.0 thick cardiac index  2.4  . Pure hypercholesterolemia   . S/P drug eluting coronary stent placement 05/08/2011   x1 DES to proximal LAD  . Symptomatic cholelithiasis 07/18/2016  . Tubulovillous adenoma of colon   . Wears dentures    lower  . Wears glasses    Past Surgical History:  Procedure Laterality Date  . CARDIAC CATHETERIZATION Left 03/05/2015   Procedure: CENTRAL LINE INSERTION;  Surgeon: Aviva Signs, MD;  Location: [facility];  Service: General;  Laterality: Left;  . CATARACT EXTRACTION W/ INTRAOCULAR LENS  IMPLANT, BILATERAL  ~ 2002   . CHOLECYSTECTOMY N/A 07/18/2016   Procedure: LAPAROSCOPIC CHOLECYSTECTOMY WITH INTRAOPERATIVE CHOLANGIOGRAM;  Surgeon: [name], MD;  Location: [facility];  Service: General;  Laterality: N/A;  . COLECTOMY WITH COLOSTOMY CREATION/HARTMANN PROCEDURE N/A 03/05/2015   Procedure: PARTIAL COLECTOMY WITH COLOSTOMY CREATION/HARTMANN PROCEDURE;  Surgeon: Aviva Signs, MD;  Location: [facility];  Service: General;  Laterality: N/A;  . COLON SURGERY     colostomy bag  LLQ  . ESOPHAGEAL MANOMETRY N/A 06/06/2014   Procedure: ESOPHAGEAL MANOMETRY (EM);  Surgeon: Jerene Bears, MD;  Location: [facility];  Service: Gastroenterology;  Laterality: N/A;  . ESOPHAGOGASTRODUODENOSCOPY (EGD) WITH PROPOFOL N/A 08/04/2015   Procedure: ESOPHAGOGASTRODUODENOSCOPY (EGD) WITH PROPOFOL;  Surgeon: Mauri Pole, MD;  Location: [facility];  Service: Endoscopy;  Laterality: N/A;  . HERNIA REPAIR  2019   left side/ removed mass  . IR GENERIC HISTORICAL  03/04/2016   IR LUMBAR DISC ASPIRATION W/IMG GUIDE 03/04/2016 Luanne Bras, MD [facility]-INTERV RAD  . LAPAROSCOPIC NISSEN FUNDOPLICATION N/A 5/57/3220   Procedure: LAPAROSCOPIC  LYSIS OF ADHESIONS, SEGMENTAL GASTRECTOMY, PARTIAL REDUCTION OF HERNIA;  Surgeon: [name], MD;  Location: [facility];  Service: General;  Laterality: N/A;  . LAPAROSCOPIC PARTIAL COLECTOMY N/A 05/07/2017   Procedure: LAPAROSCOPIC  CONVERTED TO OPEN RIGHT COLECTOMY, PARASTOMAL HERNIA  REPAIR;  Surgeon: Leighton Ruff, MD;  Location: [facility];  Service: General;  Laterality: N/A;  . NISSEN FUNDOPLICATION  2542  . PERCUTANEOUS CORONARY STENT INTERVENTION (PCI-S) N/A 05/08/2011   Procedure: PERCUTANEOUS CORONARY STENT INTERVENTION (PCI-S);  Surgeon: Wellington Hampshire, MD;  Location: [facility] CATH LAB;  Service: Cardiovascular;  Laterality: N/A;  . ROTATOR CUFF REPAIR  ~ 2001   right  . SHOULDER ARTHROSCOPY  ~ 2004; 2005   left; "joint's wore out"  . TONSILLECTOMY  ~ 1944  . TUBAL LIGATION  1972     A IV  Location/Drains/Wounds Patient Lines/Drains/Airways Status   Active Line/Drains/Airways    Name:   Placement date:   Placement time:   Site:   Days:   Peripheral IV 06/14/18 Left Forearm   06/14/18    1300    Forearm   less than 1   Closed System Drain Right RLQ Bulb (JP) 19 Fr.   06/05/18    1337    RLQ   9   Colostomy LLQ   03/17/15    -    LLQ   1185   Incision (Closed) 05/07/17 Abdomen   05/07/17    1414     403   Incision (Closed) 06/05/18 Abdomen Other (Comment)   06/05/18    1348     9   Incision - 4 Ports Abdomen Right;Lower Mid;Upper Left;Mid;Medial Left;Lateral   06/05/18    0810     9   Incision - 2 Ports Abdomen Left;Lateral;Upper Left;Lateral;Lower   05/07/17    1133     403  Intake/Output Last 24 hours No intake or output data in the 24 hours ending 06/14/18 1943  Labs/Imaging Results for orders placed or performed during the hospital encounter of 06/14/18 (from the past 48 hour(s))  Comprehensive metabolic panel     Status: Abnormal   Collection Time: 06/14/18  1:28 PM  Result Value Ref Range   Sodium 137 135 - 145 mmol/L   Potassium 3.5 3.5 - 5.1 mmol/L   Chloride 108 98 - 111 mmol/L   CO2 23 22 - 32 mmol/L   Glucose, Bld 110 (H) 70 - 99 mg/dL   BUN 15 8 - 23 mg/dL   Creatinine, Ser 1.60 (H) 0.44 - 1.00 mg/dL   Calcium 7.9 (L) 8.9 - 10.3 mg/dL   Total Protein 5.7 (L) 6.5 - 8.1 g/dL   Albumin 2.3 (L) 3.5 - 5.0 g/dL   AST 15 15 - 41 U/L   ALT 21 0 - 44 U/L   Alkaline Phosphatase 58 38 - 126 U/L   Total Bilirubin 0.7 0.3 - 1.2 mg/dL   GFR calc non Af Amer 30 (L) >60 mL/min   GFR calc Af Amer 35 (L) >60 mL/min   Anion gap 6 5 - 15    Comment: Performed at [facility], [address] [place], [place] [address]  CBC with Differential     Status: Abnormal   Collection Time: 06/14/18  1:28 PM  Result Value Ref Range   WBC 9.5 4.0 - 10.5 K/uL   RBC 3.54 (L) 3.87 - 5.11 MIL/uL   Hemoglobin 9.1 (L) 12.0 - 15.0 g/dL   HCT 30.7 (L) 36.0 - 46.0 %    MCV 86.7 80.0 - 100.0 fL   MCH 25.7 (L) 26.0 - 34.0 pg   MCHC 29.6 (L) 30.0 - 36.0 g/dL   RDW 16.4 (H) 11.5 - 15.5 %   Platelets 332 150 - 400 K/uL   nRBC 0.0 0.0 - 0.2 %   Neutrophils Relative % 82 %   Neutro Abs 7.8 (H) 1.7 - 7.7 K/uL   Lymphocytes Relative 10 %   Lymphs Abs 0.9 0.7 - 4.0 K/uL   Monocytes Relative 5 %   Monocytes Absolute 0.4 0.1 - 1.0 K/uL   Eosinophils Relative 2 %   Eosinophils Absolute 0.2 0.0 - 0.5 K/uL   Basophils Relative 0 %   Basophils Absolute 0.0 0.0 - 0.1 K/uL   Immature Granulocytes 1 %   Abs Immature Granulocytes 0.09 (H) 0.00 - 0.07 K/uL    Comment: Performed at [facility], [address], [place], [place] [address]  Urinalysis, Routine w reflex microscopic     Status: Abnormal   Collection Time: 06/14/18  1:28 PM  Result Value Ref Range   Color, Urine AMBER (A) YELLOW    Comment: BIOCHEMICALS MAY BE AFFECTED BY COLOR   APPearance HAZY (A) CLEAR   Specific Gravity, Urine 1.028 1.005 - 1.030   pH 5.0 5.0 - 8.0   Glucose, UA NEGATIVE NEGATIVE mg/dL   Hgb urine dipstick NEGATIVE NEGATIVE   Bilirubin Urine NEGATIVE NEGATIVE   Ketones, ur NEGATIVE NEGATIVE mg/dL   Protein, ur 30 (A) NEGATIVE mg/dL   Nitrite NEGATIVE NEGATIVE   Leukocytes,Ua NEGATIVE NEGATIVE   RBC / HPF 0-5 0 - 5 RBC/hpf   WBC, UA 0-5 0 - 5 WBC/hpf   Bacteria, UA NONE SEEN NONE SEEN   Squamous Epithelial / LPF 0-5 0 - 5   Mucus PRESENT  Comment: Performed at [facility], [place] [address] [place], [place] [address]  Magnesium     Status: None   Collection Time: 06/14/18  1:28 PM  Result Value Ref Range   Magnesium 2.0 1.7 - 2.4 mg/dL    Comment: Performed at [facility] [address], [place], [place] [address]  Brain natriuretic peptide     Status: Abnormal   Collection Time: 06/14/18  1:28 PM  Result Value Ref Range   B Natriuretic Peptide 570.9 (H) 0.0 - 100.0 pg/mL    Comment: Performed at [facility] [address], [place],  [place] [address]  I-Stat Troponin, ED (not at [facility])     Status: Abnormal   Collection Time: 06/14/18  1:40 PM  Result Value Ref Range   Troponin i, poc 1.16 (HH) 0.00 - 0.08 ng/mL   Comment NOTIFIED PHYSICIAN    Comment 3            Comment: Due to the release kinetics of cTnI, a negative result within the first hours of the onset of symptoms does not rule out myocardial infarction with certainty. If myocardial infarction is still suspected, repeat the test at appropriate intervals.    Dg Chest 2 View  Result Date: 06/14/2018 CLINICAL DATA:  Shortness of breath. EXAM: CHEST - 2 VIEW COMPARISON:  06/06/2018 FINDINGS: The heart is enlarged but stable. There is a moderate-sized hiatal hernia. The mediastinal and hilar contours are within normal limits and stable. Stable eventration of the left hemidiaphragm with mild overlying vascular crowding and streaky atelectasis. There are small bilateral pleural effusions but no pulmonary edema or definite infiltrates. IMPRESSION: Small bilateral pleural effusions without pulmonary edema or infiltrates. Electronically Signed   By: Marijo Sanes M.D.   On: 06/14/2018 14:03    Pending Labs [employer] (From admission, onward)    Start     Ordered   Signed and Held  Comprehensive metabolic panel  Tomorrow morning,   R     Signed and Held   Signed and Held  CBC  Tomorrow morning,   R     Signed and Held          Vitals/Pain Today's Vitals   06/14/18 1715 06/14/18 1800 06/14/18 1815 06/14/18 1845  BP: 101/85 114/66 127/70 110/64  Pulse: (!) 102 94 94 95  Resp: (!) 22 (!) 41 (!) 24 19  Temp:      TempSrc:      SpO2: 96% 98% 97% 96%  Weight:      Height:      PainSc:        Isolation Precautions No active isolations  Medications Medications  sodium chloride 0.9 % bolus 500 mL (0 mLs Intravenous Stopped 06/14/18 1648)    Mobility walks with device High fall risk   Focused Assessments Pulmonary Assessment Handoff:  Lung sounds:  Bilateral Breath Sounds: Clear L Breath Sounds: Clear R Breath Sounds: Clear O2 Device: Room Air        R Recommendations: See Admitting Provider Note  Report given to:   Additional Notes: n/a

## 2018-06-14 NOTE — ED Provider Notes (Signed)
[facility] EMERGENCY DEPARTMENT Provider Note   CSN: [id number] Arrival date & time: 06/14/18  1256    History   Chief Complaint Chief Complaint  Patient presents with  . Shortness of Breath     & weakness,afib  . Post-op Problem    HPI [name] is a 84 y.o. female.     HPI  Patient is an 84 year old female with a history of atrial fibrillation on Eliquis, stage III colon cancer status post colectomy and subsequent colostomy in place, history of hiatal hernia s/p repair by Dr. Gerald Stabs on 06-05-2018 with laparoscopic fundoplication, and repair of incarcerated hiatal hernia presenting for generalized weakness and shortness of breath.  Patient reports that she is felt this way ever since she was discharged on 06-11-2018.  She reports that she does not have any physical therapy or home health currently.  She reports that she has not typically been in atrial fibrillation until recently.  She reports that she is taking Eliquis compliantly, and does not know when she is to follow-up with cardiology.  She denies any chest pain, feeling palpitations, presyncope or syncope.  Denies fevers, chills, cough, congestion, rhinorrhea, or cough productive of sputum.  Denies any dark and tarry output or bloody output from her colostomy.  Denies any dysuria, urgency, frequency, flank pain.  She does feel that anytime she tries to walk about her house she cannot go very far without getting short of breath.  She reports that he can barely get to the kitchen to get water.  Patient has family, but they are not available 24/7 for assistance.  She reports that she is getting progressively worse instead of better.  Past Medical History:  Diagnosis Date  . Anemia due to blood loss, chronic   . Anxiety state, unspecified   . Atrophy of left kidney   . Cancer ([place])    melanoma on back  . Cancer of right colon ([place]) 05/20/2017  . CKD (chronic kidney disease), stage III (HCC)      sees Dr. Lowanda Foster    . Colon cancer ([place]) 2019   stage 3  . Colostomy in place [facility])    in 2016  . Coronary artery disease cardiologist-  dr Bronson Ing   a. Moderate to severe coronary disease involving the left anterior descending artery and right coronary artery.  Sequential stenosis in the LAD is significant.  Right coronary artery is moderate to severely likely nonischemic. Questionable small LVOT obstruction.  No Brockenbrough maneuver was performed.  Catheterization January 2013 w/ PTCA and DES x1 to pLAD b. 05/2014 MV no isch/infarct, EF 60%.  . Depressive disorder, not elsewhere classified   . Diastolic CHF, chronic [facility])    cardiologist-  dr Bronson Ing   pt. denies at preop. 06/07/18 - EF 60-65% on echo, no obvious dysfunction appreciated  . Diversion colitis 2016  . Epidural abscess L5- S1 03/01/2016  . Gastro-esophageal reflux disease without esophagitis 07/16/2007   Overview:  Overview:  Qualifier: Diagnosis of  By: Nelson-Smith CMA (AAMA), Dottie  Overview:  Overview:  Overview:  Qualifier: Diagnosis of  By: Nelson-Smith CMA (AAMA), Dottie   . GERD (gastroesophageal reflux disease)   . H/O hiatal hernia   . History of [name]'s esophagus   . History of melanoma excision    back  . Hypertension   . Hypothyroidism   . Infection of spine ([place])    in hospial  . Macular degeneration    both eyes  . PAF (paroxysmal  atrial fibrillation) ([place])   . Parastomal hernia   . Pneumonia   . PONV (postoperative nausea and vomiting)   . Pulmonary hypertension ([place])    PA systolic [id number] mmHg by echocardiogram 06-17-2015  PA pressure 05-08-2011 by cardiac catheterization -- no sig. pulm. htn)PA saturation 62% thermodilution cardiac index 2.0 thick cardiac index 2.4  . Pure hypercholesterolemia   . S/P drug eluting coronary stent placement 05/08/2011   x1 DES to proximal LAD  . Symptomatic cholelithiasis 07/18/2016  . Tubulovillous adenoma of colon   . Wears dentures    lower  . Wears glasses      Patient Active Problem List   Diagnosis Date Noted  . Non-STEMI (non-ST elevated myocardial infarction) ([place]) 06/11/2018  . Current use of long term anticoagulation 06/11/2018  . Incarcerated recurrent hiatal hernia s/p robotic repair 06/05/2018 06/05/2018  . GERD (gastroesophageal reflux disease) s/p [place] fundoplication (partial posterior wrap) 06/05/2018  . S/P robotic partial posterior ([place]) fundoplication 11/03/2776 24/23/5361  . Pulmonary hypertension ([place])   . Hypothyroidism   . History of [name]'s esophagus   . Anxiety state   . Acquired hypothyroidism 01/30/2018  . Cancer of right colon ([place]) 05/20/2017  . Irritable bowel syndrome with constipation 10/15/2016  . Iron deficiency 08/13/2016  . Discitis of lumbar region L5- S1 03/01/2016  . Chronic diastolic CHF (congestive heart failure) ([place]) 03/01/2016  . DDD (degenerative disc disease), lumbar 02/12/2016  . History of GI bleed 12/18/2015  . Gastric volvulus 08/29/2015  . Erosive esophagitis 08/29/2015  . Atrial fibrillation with RVR ([place]) 08/09/2015  . TIA (transient ischemic attack)   . Pressure ulcer 06/17/2015  . Essential hypertension   . Colonic mass 05/02/2015  . Anemia due to chronic blood loss 05/02/2015  . PAF (paroxysmal atrial fibrillation) ([place]) 05/02/2015  . Anxiety and depression 05/02/2015  . CKD (chronic kidney disease) stage 3, GFR 30-59 ml/min (HCC) 03/03/2015  . History of percutaneous coronary intervention   . Coronary artery disease   . HYPERCHOLESTEROLEMIA 07/16/2007  . Major depressive disorder, single episode, unspecified 07/16/2007    Past Surgical History:  Procedure Laterality Date  . CARDIAC CATHETERIZATION Left 03/05/2015   Procedure: CENTRAL LINE INSERTION;  Surgeon: Aviva Signs, MD;  Location: [facility] ORS;  Service: General;  Laterality: Left;  . CATARACT EXTRACTION W/ INTRAOCULAR LENS  IMPLANT, BILATERAL  ~ 2002  . CHOLECYSTECTOMY N/A 07/18/2016   Procedure: LAPAROSCOPIC CHOLECYSTECTOMY  WITH INTRAOPERATIVE CHOLANGIOGRAM;  Surgeon: [name], MD;  Location: [facility] ORS;  Service: General;  Laterality: N/A;  . COLECTOMY WITH COLOSTOMY CREATION/HARTMANN PROCEDURE N/A 03/05/2015   Procedure: PARTIAL COLECTOMY WITH COLOSTOMY CREATION/HARTMANN PROCEDURE;  Surgeon: Aviva Signs, MD;  Location: [facility] ORS;  Service: General;  Laterality: N/A;  . COLON SURGERY     colostomy bag  LLQ  . ESOPHAGEAL MANOMETRY N/A 06/06/2014   Procedure: ESOPHAGEAL MANOMETRY (EM);  Surgeon: Jerene Bears, MD;  Location: [facility] ENDOSCOPY;  Service: Gastroenterology;  Laterality: N/A;  . ESOPHAGOGASTRODUODENOSCOPY (EGD) WITH PROPOFOL N/A 08/04/2015   Procedure: ESOPHAGOGASTRODUODENOSCOPY (EGD) WITH PROPOFOL;  Surgeon: Mauri Pole, MD;  Location: [facility] ENDOS[facility];  Service: Endoscopy;  Laterality: N/A;  . HERNIA REPAIR  2019   left side/ removed mass  . IR GENERIC HISTORICAL  03/04/2016   IR LUMBAR DISC ASPIRATION W/IMG GUIDE 03/04/2016 Luanne Bras, MD [facility]-INTERV RAD  . LAPAROSCOPIC NISSEN FUNDOPLICATION N/A 4/43/1540   Procedure: LAPAROSCOPIC  LYSIS OF ADHESIONS, SEGMENTAL GASTRECTOMY, PARTIAL REDUCTION OF HERNIA;  Surgeon: [name], MD;  Location: [facility] OR[facility];  Service: General;  Laterality: N/A;  . LAPAROSCOPIC PARTIAL COLECTOMY N/A 05/07/2017   Procedure: LAPAROSCOPIC  CONVERTED TO OPEN RIGHT COLECTOMY, PARASTOMAL HERNIA  REPAIR;  Surgeon: Leighton Ruff, MD;  Location: [facility] OR[facility];  Service: General;  Laterality: N/A;  . NISSEN FUNDOPLICATION  8938  . PERCUTANEOUS CORONARY STENT INTERVENTION (PCI-S) N/A 05/08/2011   Procedure: PERCUTANEOUS CORONARY STENT INTERVENTION (PCI-S);  Surgeon: Wellington Hampshire, MD;  Location: [facility] CATH LAB;  Service: Cardiovascular;  Laterality: N/A;  . ROTATOR CUFF REPAIR  ~ 2001   right  . SHOULDER ARTHROSCOPY  ~ 2004; 2005   left; "joint's wore out"  . TONSILLECTOMY  ~ 1944  . TUBAL LIGATION  1972     OB History   No obstetric history on file.      Home Medications    Prior to  Admission medications   Medication Sig Start Date End Date Taking? Authorizing Provider  albuterol (PROVENTIL HFA;VENTOLIN HFA) 108 (90 Base) MCG/ACT inhaler Inhale 2 puffs into the lungs every 6 (six) hours as needed for wheezing or shortness of breath. 04/15/18   Terald Sleeper, PA-C  ALPRAZolam (XANAX) 0.25 MG tablet TAKEK 1/2 TO 1 TABLET 3 TIMES DAILY AS NEEDED Patient taking differently: Take 0.125-0.25 mg by mouth 3 (three) times daily as needed for anxiety.  01/30/18   Terald Sleeper, PA-C  amiodarone (PACERONE) 400 MG tablet Take 1 tablet (400 mg total) by mouth 2 (two) times daily. 06/11/18   Michael Boston, MD  apixaban (ELIQUIS) 5 MG TABS tablet Take 1 tablet (5 mg total) by mouth 2 (two) times daily. 06/11/18   Michael Boston, MD  citalopram (CELEXA) 40 MG tablet TAKE ONE (1) TABLET EACH DAY Patient taking differently: Take 40 mg by mouth daily.  01/30/18   Terald Sleeper, PA-C  denosumab (PROLIA) 60 MG/ML SOSY injection Inject 60 mg into the skin every 6 (six) months. 02/18/18   Terald Sleeper, PA-C  esomeprazole (NEXIUM) 40 MG capsule TAKE ONE (1) [place] Patient taking differently: Take 40 mg by mouth daily.  01/30/18   Terald Sleeper, PA-C  ferrous sulfate 325 (65 FE) MG EC tablet Take 325 mg by mouth daily with breakfast.     [provider]  fluticasone (FLONASE) 50 MCG/ACT nasal spray USE 2 SPRAYS IN EACH NOSTRIL DAILY Patient taking differently: Place 2 sprays into both nostrils daily.  10/15/17   Terald Sleeper, PA-C  furosemide (LASIX) 40 MG tablet Take 40 mg by mouth as needed for fluid.     [provider]  hydrALAZINE (APRESOLINE) 50 MG tablet TAKE ONE (1) TABLET THREE (3) TIMES EACH DAY Patient taking differently: Take 50 mg by mouth 3 (three) times daily.  01/30/18   Terald Sleeper, PA-C  isosorbide mononitrate (IMDUR) 30 MG 24 hr tablet Take 1 tablet (30 mg total) by mouth every morning. 01/30/18   Terald Sleeper, PA-C  levothyroxine (SYNTHROID,  LEVOTHROID) 50 MCG tablet TAKE ONE TABLET EACH MORNING BEFORE BREAKFAST Patient taking differently: Take 50 mcg by mouth daily before breakfast.  01/30/18   Terald Sleeper, PA-C  loratadine (CLARITIN) 10 MG tablet Take 1 tablet (10 mg total) by mouth at bedtime. 01/30/18   Terald Sleeper, PA-C  losartan (COZAAR) 100 MG tablet TAKE ONE (1) TABLET EACH DAY Patient taking differently: Take 50 mg by mouth daily.  01/30/18   Terald Sleeper, PA-C  Meclizine HCl 25 MG CHEW TAKE 1 TABLET 3 TIMES DAILY  AS NEEDED FOR DIZZINESS Patient taking differently: Chew 25 mg by mouth 3 (three) times daily as needed (dizziness).  10/15/17   Terald Sleeper, PA-C  metoprolol succinate (TOPROL-XL) 25 MG 24 hr tablet Take 0.5 tablets (12.5 mg total) by mouth daily. 05/05/18   Terald Sleeper, PA-C  montelukast (SINGULAIR) 10 MG tablet Take 1 tablet (10 mg total) by mouth at bedtime. 01/30/18   Terald Sleeper, PA-C  nitroGLYCERIN (NITROSTAT) 0.4 MG SL tablet Place 1 tablet (0.4 mg total) under the tongue every 5 (five) minutes as needed for chest pain. Reported on 04/26/2015 01/13/17   Herminio Commons, MD  nystatin (MYCOSTATIN/NYSTOP) powder Apply topically 4 (four) times daily. Patient taking differently: Apply 1 Bottle topically as needed (yeast).  05/05/18   Terald Sleeper, PA-C  ondansetron (ZOFRAN-ODT) 4 MG disintegrating tablet Take 1 tablet (4 mg total) by mouth every 6 (six) hours as needed for nausea or vomiting. 06/11/18   Michael Boston, MD  polyethylene glycol powder (GAVILAX) powder MIX 17 GRAMS IN 80Z OF WATER/JUICE AND DRINK TWICE DAILY Patient taking differently: Take 17 g by mouth daily.  10/15/17   Terald Sleeper, PA-C  potassium chloride (K-DUR) 10 MEQ tablet TAKE ONE (1) TABLET EACH DAY Patient taking differently: Take 10 mEq by mouth daily as needed (when taking furosemide).  10/15/17   Terald Sleeper, PA-C  Propylene Glycol (SYSTANE BALANCE OP) Apply 1 drop to eye daily as needed (dry eyes).    [provider]  rosuvastatin (CRESTOR) 10 MG tablet TAKE ONE (1) TABLET EACH DAY Patient taking differently: Take 10 mg by mouth daily.  01/30/18   Terald Sleeper, PA-C  traMADol (ULTRAM) 50 MG tablet Take 1 tablet (50 mg total) by mouth every 12 (twelve) hours as needed. Patient taking differently: Take 50 mg by mouth daily as needed for moderate pain.  06/12/17   Alla Feeling, NP  traZODone (DESYREL) 100 MG tablet Take 1 tablet (100 mg total) by mouth at bedtime. 01/30/18   Terald Sleeper, PA-C  Vitamin D, Ergocalciferol, (DRISDOL) 50000 units CAPS capsule TAKE 1 CAPSULE ON TUESDAYS Patient taking differently: Take 50,000 Units by mouth every Tuesday.  09/11/17   Terald Sleeper, PA-C  zolpidem (AMBIEN) 10 MG tablet Take 1 tablet (10 mg total) by mouth at bedtime. Patient taking differently: Take 10 mg by mouth at bedtime as needed for sleep.  01/30/18   Terald Sleeper, PA-C    Family History Family History  Problem Relation Age of Onset  . Heart attack Father   . Hypertension Father   . Hypertension Mother   . Skin cancer Brother        melanoma  . Atrial fibrillation Sister   . Skin cancer Sister   . Colon cancer Neg Hx   . Esophageal cancer Neg Hx   . Rectal cancer Neg Hx   . Stomach cancer Neg Hx     Social History Social History   Tobacco Use  . Smoking status: Former Smoker    Packs/day: 1.00    Years: 20.00    Pack years: 20.00    Types: Cigarettes    Start date: 10/12/1958    Last attempt to quit: 04/26/1988    Years since quitting: 30.1  . Smokeless tobacco: Never Used  Substance Use Topics  . Alcohol use: No    Alcohol/week: 0.0 standard drinks  . Drug use: No     Allergies  Sulfamethoxazole   Review of Systems Review of Systems  Constitutional: Negative for chills and fever.  HENT: Negative for congestion, rhinorrhea, sinus pain and sore throat.   Eyes: Negative for visual disturbance.  Respiratory: Positive for shortness of breath. Negative for  cough, chest tightness and wheezing.   Cardiovascular: Negative for chest pain, palpitations and leg swelling.  Gastrointestinal: Negative for abdominal pain, diarrhea, nausea and vomiting.  Genitourinary: Negative for dysuria and flank pain.  Musculoskeletal: Negative for back pain and myalgias.  Skin: Negative for rash.  Neurological: Positive for weakness and light-headedness. Negative for dizziness, syncope and headaches.     Physical Exam Updated Vital Signs BP 116/76   Pulse 87   Temp 97.9 F (36.6 C) (Oral)   Resp (!) 21   Ht 5' 2.5" (1.588 m)   Wt 72.3 kg   SpO2 100%   BMI 28.69 kg/m   Physical Exam Vitals signs and nursing note reviewed.  Constitutional:      General: She is not in acute distress.    Appearance: She is well-developed.  HENT:     Head: Normocephalic and atraumatic.  Eyes:     Conjunctiva/sclera: Conjunctivae normal.     Pupils: Pupils are equal, round, and reactive to light.  Neck:     Musculoskeletal: Normal range of motion and neck supple.  Cardiovascular:     Rate and Rhythm: Normal rate. Rhythm irregular.     Heart sounds: S1 normal and S2 normal. No murmur.     Comments: No lower extremity edema.  No calf tenderness. Pulmonary:     Effort: Pulmonary effort is normal.     Breath sounds: Examination of the right-lower field reveals decreased breath sounds. Examination of the left-lower field reveals decreased breath sounds. Decreased breath sounds present. No wheezing or rales.  Abdominal:     General: There is no distension.     Palpations: Abdomen is soft.     Tenderness: There is no abdominal tenderness. There is no guarding.  Musculoskeletal: Normal range of motion.        General: No deformity.  Lymphadenopathy:     Cervical: No cervical adenopathy.  Skin:    General: Skin is warm and dry.     Findings: No erythema or rash.  Neurological:     Mental Status: She is alert.     Comments: Cranial nerves grossly intact. Patient moves  extremities symmetrically and with good coordination.  Psychiatric:        Behavior: Behavior normal.        Thought Content: Thought content normal.        Judgment: Judgment normal.      ED Treatments / Results  Labs (all labs ordered are listed, but only abnormal results are displayed) Labs Reviewed  COMPREHENSIVE METABOLIC PANEL - Abnormal; Notable for the following components:      Result Value   Glucose, Bld 110 (*)    Creatinine, Ser 1.60 (*)    Calcium 7.9 (*)    Total Protein 5.7 (*)    Albumin 2.3 (*)    GFR calc non Af Amer 30 (*)    GFR calc Af Amer 35 (*)    All other components within normal limits  CBC WITH DIFFERENTIAL/PLATELET - Abnormal; Notable for the following components:   RBC 3.54 (*)    Hemoglobin 9.1 (*)    HCT 30.7 (*)    MCH 25.7 (*)    MCHC 29.6 (*)    RDW 16.4 (*)  Neutro Abs 7.8 (*)    Abs Immature Granulocytes 0.09 (*)    All other components within normal limits  URINALYSIS, ROUTINE W REFLEX MICROSCOPIC - Abnormal; Notable for the following components:   Color, Urine AMBER (*)    APPearance HAZY (*)    Protein, ur 30 (*)    All other components within normal limits  BRAIN NATRIURETIC PEPTIDE - Abnormal; Notable for the following components:   B Natriuretic Peptide 570.9 (*)    All other components within normal limits  I-STAT TROPONIN, ED - Abnormal; Notable for the following components:   Troponin i, poc 1.16 (*)    All other components within normal limits  MAGNESIUM  POC OCCULT BLOOD, ED    EKG EKG Interpretation  Date/Time:  Sunday June 14 2018 12:57:54 EDT Ventricular Rate:  88 PR Interval:    QRS Duration: 100 QT Interval:  387 QTC Calculation: 469 R Axis:   -17 Text Interpretation:  Atrial fibrillation Ventricular premature complex Borderline left axis deviation Borderline repolarization abnormality Confirmed by Julianne Rice [phone]) on 06/14/2018 2:08:10 PM   Radiology Dg Chest 2 View  Result Date: 06/14/2018  CLINICAL DATA:  Shortness of breath. EXAM: CHEST - 2 VIEW COMPARISON:  06/06/2018 FINDINGS: The heart is enlarged but stable. There is a moderate-sized hiatal hernia. The mediastinal and hilar contours are within normal limits and stable. Stable eventration of the left hemidiaphragm with mild overlying vascular crowding and streaky atelectasis. There are small bilateral pleural effusions but no pulmonary edema or definite infiltrates. IMPRESSION: Small bilateral pleural effusions without pulmonary edema or infiltrates. Electronically Signed   By: Marijo Sanes M.D.   On: 06/14/2018 14:03    Procedures Procedures (including critical care time)  Medications Ordered in ED Medications  sodium chloride 0.9 % bolus 500 mL (500 mLs Intravenous New Bag/Given 06/14/18 1439)     Initial Impression / Assessment and Plan / ED Course  I have reviewed the triage vital signs and the nursing notes.  Pertinent labs & imaging results that were available during my care of the patient were reviewed by me and considered in my medical decision making (see chart for details).  Clinical Course as of Jun 13 1856  Sun Jun 14, 2018  1351 Pt had troponin elevation while in hospital and last value on 06/08/2018 was 4.39. Max trop 10 in hospital and thought to be NSTEMI; pt had no active CP and was not cathed. Hx of LAD stent in 2013. Will obtain delta but likely downtrending from recent leak. Cards consult placed.   Troponin i, poc(!!): 1.16 [AM]  4315 Spoke with Dr. Percival Spanish of cardiology who states that since patient's troponin is downtrending he feels that this is not indicative of ACS at this time, and does not need to be repeated in delta troponin.  Additionally, patient had a echocardiogram 3 days ago which showed a normal EF, and he feels that this is sufficiently recent to determine that patient has appropriate cardiac output.  Additionally, would not recommend moving up patient's cardioversion at this time, as she is  clinically stable with heart rate, blood pressures, and without evidence of florid pulmonary edema.  I appreciate his involvement in the care of this patient.   [AM]  1758 Patient ambulating Cane tachypneic and tachycardic.  Will discuss with cardiology.    [AM]  1841 [place] discussed with Dr. Neena Rhymes of cardiology who states that given the symptomatic nature of patient's shortness of breath and weakness with ambulation, admission  is reasonable with cardiology consultation in the morning.  Appreciate his involvement.   [AM]  1857 Spoke with Dr. Jonelle Sidle who will admit patient.  Appreciate his involvement in the care of this patient.   [AM]    Clinical Course User Index [AM] Albesa Seen, PA-C       Patient nontoxic-appearing and hemodynamically stable at rest.  Differential diagnosis includes symptomatic atrial fibrillation, ACS, pneumonia, urinary tract infection, general debilitation status post surgery.  Pulmonary embolism was considered in this patient, however not pursued, she is compliantly anticoagulated with Eliquis.  Work-up demonstrating no leukocytosis.  Hemoglobin is 9.1, down approximately 1.5 g since her discharge from the hospital.  She has not had any sources of bleeding.  She has a slight AKI with a creatinine of 1.6.  Will provide soft repletion.  Patient has elevated troponin of 1.16.  She is trending down from hospital stay, and repeat not recommended by Dr. Percival Spanish.  BNP is 570.  Magnesium normal.  No other significant electrolyte normalities.  No evidence of urinary tract infection.  Chest x-Shilah, reviewed by me, shows small bilateral pleural effusions, but no significant pulmonary edema.  EKG is in atrial fibrillation with no evidence of changes from her recent hospitalization to suggest acute coronary event.  Patient is highly symptomatic on ambulation.  She may be chronically debilitated from her recent surgery, however she was cleared for home health physical therapy  by PT/OT on her recent discharge.  She continues to be in atrial fibrillation, and she is not usually in atrial fibrillation prior to her recent hospitalization.  She was ambulated and will maintain saturation 96 and greater she was tachypneic and tachycardic up to 140.  Will discuss again with Dr. Percival Spanish..   Patient to be admitted per Dr. Jonelle Sidle.  Appreciate his involvement in the care of this patient.  This is a shared visit with Dr. Julianne Rice. Patient was independently evaluated by this attending physician. Attending physician consulted in evaluation and management.  Final Clinical Impressions(s) / ED Diagnoses   Final diagnoses:  Generalized weakness  Atrial fibrillation, unspecified type ([place])  AKI (acute kidney injury) ([place])  Anemia, unspecified type    ED Discharge Orders    None       [place], [place] 06/14/18 1859    Julianne Rice, MD 06/15/18 [phone]

## 2018-06-14 NOTE — ED Notes (Signed)
Patient transported to [facility] 

## 2018-06-14 NOTE — ED Notes (Signed)
pts mother, Helene Kelp, would like a call with an update if pt is admitted or discharged if possible at [phone]

## 2018-06-14 NOTE — ED Notes (Signed)
Patient transported to [facility] 

## 2018-06-14 NOTE — H&P (Signed)
History and Physical   [name] HYW:[id number] DOB: 09/27/1934 DOA: 06/14/2018  Referring MD/NP/PA: Dr. Lita Mains  PCP: Terald Sleeper, PA-C   Outpatient Specialists: Dr. [name] pyrtle  Patient coming from: Home  Chief Complaint: Shortness of breath and weakness  HPI: [name] is a 84 y.o. female with medical history significant of incarcerated hiatal hernia with recent laparoscopic fundoplication, atrial fibrillation on Eliquis, stage III colon cancer status post colectomy with colostomy in place, history of hiatal hernia that was initially repaired on March 6, anemia of chronic disease, anxiety disorder who was recently discharged on March 12.  Patient postoperative period  Complicated with non-ST elevation MI with very high troponins.  She went home postoperatively and was doing better until the last few days.  Patient has been unable to walk even to the bathroom.  She has been so weak and debilitated.  She lives alone.  Denied any fever or chills.  She denied any nausea or vomiting.  She has colostomy with normal colored stool.  No dysuria.  Patient could not even get to the kitchen.  She lives by herself so she came to the ER by EMS where she was seen and evaluated.  Her surgical site appears normal no evidence of infection.  Patient still in atrial fibrillation with a rate controlled.  She is on Eliquis with plan for possible cardioversion in the next few weeks.  Cardiology has been called and recommended admission and they will follow.  Patient has been admitted to the medical service with significant weakness..  ED Course: Temperature is 97.9 blood pressure 101/60 per pulse 140 respiratory rate 41 oxygen sat 92% on 2 L.  White count is 9.5 hemoglobin 9.1 platelets 332.  Chemistry is within normal except creatinine 1.60 her discharge creatinine was 1.1.  Calcium 7.9 and glucose 110.  BNP is 570 and troponin I 0.16 which is trending downwards.  Urinalysis is negative and chest x-Metzli negative.   Showed atrial fibrillation with ventricular premature complex and a rate of 88.  Review of Systems: As per HPI otherwise 10 point review of systems negative.    Past Medical History:  Diagnosis Date   Anemia due to blood loss, chronic    Anxiety state, unspecified    Atrophy of left kidney    Cancer ([place])    melanoma on back   Cancer of right colon ([place]) 05/20/2017   CKD (chronic kidney disease), stage III [facility])      sees Dr. Lowanda Foster    Colon cancer [facility]) 2019   stage 3   Colostomy in place [facility])    in 2016   Coronary artery disease cardiologist-  dr Bronson Ing   a. Moderate to severe coronary disease involving the left anterior descending artery and right coronary artery.  Sequential stenosis in the LAD is significant.  Right coronary artery is moderate to severely likely nonischemic. Questionable small LVOT obstruction.  No Brockenbrough maneuver was performed.  Catheterization January 2013 w/ PTCA and DES x1 to pLAD b. 05/2014 MV no isch/infarct, EF 60%.   Depressive disorder, not elsewhere classified    Diastolic CHF, chronic [facility])    cardiologist-  dr Bronson Ing   pt. denies at preop. 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 pulm. htn)PA saturation 62% thermodilution cardiac index 2.0 thick cardiac index 2.4   Pure hypercholesterolemia    S/P drug eluting coronary stent placement 05/08/2011   x1 DES to proximal LAD   Symptomatic cholelithiasis 07/18/2016   Tubulovillous adenoma of colon    Wears dentures    lower   Wears glasses     Past Surgical History:  Procedure Laterality Date   CARDIAC CATHETERIZATION Left 03/05/2015   Procedure: CENTRAL LINE INSERTION;  Surgeon: Aviva Signs, MD;  Location: [facility];  Service: General;  Laterality: Left;   CATARACT EXTRACTION W/ INTRAOCULAR LENS  IMPLANT, BILATERAL  ~ 2002   CHOLECYSTECTOMY N/A 07/18/2016   Procedure: LAPAROSCOPIC CHOLECYSTECTOMY WITH INTRAOPERATIVE CHOLANGIOGRAM;  Surgeon: [name], MD;  Location: [facility];  Service: General;  Laterality: N/A;   COLECTOMY WITH COLOSTOMY CREATION/HARTMANN PROCEDURE N/A 03/05/2015   Procedure: PARTIAL COLECTOMY WITH COLOSTOMY CREATION/HARTMANN PROCEDURE;  Surgeon: Aviva Signs, MD;  Location: [facility];  Service: General;  Laterality: N/A;   COLON SURGERY     colostomy bag  LLQ   ESOPHAGEAL MANOMETRY N/A 06/06/2014   Procedure: ESOPHAGEAL MANOMETRY (EM);  Surgeon: Jerene Bears, MD;  Location: [facility];  Service: Gastroenterology;  Laterality: N/A;   ESOPHAGOGASTRODUODENOSCOPY (EGD) WITH PROPOFOL N/A 08/04/2015   Procedure: ESOPHAGOGASTRODUODENOSCOPY (EGD) WITH PROPOFOL;  Surgeon: Mauri Pole, MD;  Location: [facility];  Service: Endoscopy;  Laterality: N/A;   HERNIA REPAIR  2019   left side/ removed mass   IR GENERIC HISTORICAL  03/04/2016   IR LUMBAR DISC ASPIRATION W/IMG GUIDE 03/04/2016 Luanne Bras, MD [facility]INTER[facility] RAD   LAPAROSCOPIC NISSEN FUNDOPLICATION N/A 1/96/2229   Procedure: LAPAROSCOPIC  LYSIS OF ADHESIONS, SEGMENTAL GASTRECTOMY, PARTIAL REDUCTION OF HERNIA;   Surgeon: [name], MD;  Location: [facility];  Service: General;  Laterality: N/A;   LAPAROSCOPIC PARTIAL COLECTOMY N/A 05/07/2017   Procedure: LAPAROSCOPIC  CONVERTED TO OPEN RIGHT COLECTOMY, PARASTOMAL HERNIA  REPAIR;  Surgeon: Leighton Ruff, MD;  Location: [facility];  Service: General;  Laterality: N/A;   NISSEN FUNDOPLICATION  7989   PERCUTANEOUS CORONARY STENT INTERVENTION (PCI-S) N/A 05/08/2011   Procedure: PERCUTANEOUS CORONARY STENT INTERVENTION (PCI-S);  Surgeon: Wellington Hampshire, MD;  Location: [facility] CATH LAB;  Service: Cardiovascular;  Laterality: N/A;   ROTATOR CUFF REPAIR  ~ 2001   right   SHOULDER ARTHROSCOPY  ~ 2004; 2005   left; "joint's wore out"   TONSILLECTOMY  ~ [place]     reports that she quit smoking about 30 years ago. Her smoking use included cigarettes. She started smoking about 59 years ago. She has a 20.00 pack-year smoking history. She has never used smokeless tobacco. She reports that she does not drink alcohol or use drugs.  Allergies  Allergen  Reactions   Sulfamethoxazole Rash    Family History  Problem Relation Age of Onset   Heart attack Father    Hypertension Father    Hypertension Mother    Skin cancer Brother        melanoma   Atrial fibrillation Sister    Skin cancer Sister    Colon cancer Neg Hx    Esophageal cancer Neg Hx    Rectal cancer Neg Hx    Stomach cancer Neg Hx      Prior to Admission medications   Medication Sig Start Date End Date Taking? Authorizing Provider  albuterol (PROVENTIL HFA;VENTOLIN HFA) 108 (90 Base) MCG/ACT inhaler Inhale 2 puffs into the lungs every 6 (six) hours as needed for wheezing or shortness of breath. 04/15/18  Yes Terald Sleeper, PA-C  ALPRAZolam (XANAX) 0.25 MG tablet TAKEK 1/2 TO 1 TABLET 3 TIMES DAILY AS NEEDED Patient taking differently: Take 0.125-0.25 mg by mouth 3 (three) times daily as needed for anxiety.  01/30/18  Yes Terald Sleeper, PA-C  amiodarone (PACERONE)  400 MG tablet Take 1 tablet (400 mg total) by mouth 2 (two) times daily. 06/11/18  Yes Heather Boston, MD  apixaban (ELIQUIS) 5 MG TABS tablet Take 1 tablet (5 mg total) by mouth 2 (two) times daily. 06/11/18  Yes Heather Boston, MD  citalopram (CELEXA) 40 MG tablet TAKE ONE (1) TABLET EACH DAY Patient taking differently: Take 40 mg by mouth daily.  01/30/18  Yes Terald Sleeper, PA-C  denosumab (PROLIA) 60 MG/ML SOSY injection Inject 60 mg into the skin every 6 (six) months. 02/18/18  Yes Terald Sleeper, PA-C  esomeprazole (NEXIUM) 40 MG capsule TAKE ONE (1) [place] Patient taking differently: Take 40 mg by mouth daily.  01/30/18  Yes Terald Sleeper, PA-C  ferrous sulfate 325 (65 FE) MG EC tablet Take 325 mg by mouth daily with breakfast.    Yes [provider]  fluticasone (FLONASE) 50 MCG/ACT nasal spray USE 2 SPRAYS IN EACH NOSTRIL DAILY Patient taking differently: Place 2 sprays into both nostrils daily.  10/15/17  Yes Terald Sleeper, PA-C  furosemide (LASIX) 40 MG tablet Take 40 mg by mouth as needed for fluid.    Yes [provider]  hydrALAZINE (APRESOLINE) 50 MG tablet TAKE ONE (1) TABLET THREE (3) TIMES EACH DAY Patient taking differently: Take 50 mg by mouth 3 (three) times daily.  01/30/18  Yes Terald Sleeper, PA-C  isosorbide mononitrate (IMDUR) 30 MG 24 hr tablet Take 1 tablet (30 mg total) by mouth every morning. 01/30/18  Yes Terald Sleeper, PA-C  levothyroxine (SYNTHROID, LEVOTHROID) 50 MCG tablet TAKE ONE TABLET EACH MORNING BEFORE BREAKFAST Patient taking differently: Take 50 mcg by mouth daily before breakfast.  01/30/18  Yes Terald Sleeper, PA-C  loratadine (CLARITIN) 10 MG tablet Take 1 tablet (10 mg total) by mouth at bedtime. 01/30/18  Yes Terald Sleeper, PA-C  losartan (COZAAR) 100 MG tablet TAKE ONE (1) TABLET EACH DAY Patient taking differently: Take 50 mg by mouth daily.  01/30/18  Yes Terald Sleeper, PA-C  Meclizine HCl 25 MG CHEW TAKE 1 TABLET 3 TIMES  DAILY AS NEEDED FOR DIZZINESS Patient taking differently: Chew 25 mg by mouth 3 (three) times daily as needed (dizziness).  10/15/17  Yes Terald Sleeper, PA-C  metoprolol succinate (TOPROL-XL) 25 MG 24 hr tablet Take 0.5 tablets (12.5 mg total) by mouth daily. 05/05/18  Yes Terald Sleeper, PA-C  montelukast (SINGULAIR) 10 MG tablet Take 1 tablet (10 mg total) by mouth at bedtime. 01/30/18  Yes Terald Sleeper, PA-C  nitroGLYCERIN (NITROSTAT) 0.4 MG SL tablet Place 1 tablet (0.4 mg total) under the tongue every 5 (five) minutes as needed for chest pain. Reported on 04/26/2015 01/13/17  Yes Herminio Commons, MD  nystatin (MYCOSTATIN/NYSTOP) powder Apply topically 4 (four) times daily. Patient taking differently: Apply 1 Bottle topically as needed (yeast).  05/05/18  Yes Terald Sleeper, PA-C  ondansetron (ZOFRAN-ODT) 4 MG disintegrating tablet Take 1 tablet (4 mg total) by mouth every 6 (six) hours as needed for nausea or vomiting. 06/11/18  Yes Gross, Remo Lipps, MD  polyethylene glycol powder (GAVILAX) powder MIX 17 GRAMS IN 80Z OF WATER/JUICE AND DRINK TWICE DAILY Patient taking differently: Take 17 g by mouth daily.  10/15/17  Yes Terald Sleeper, PA-C  potassium chloride (K-DUR) 10 MEQ tablet TAKE ONE (1) TABLET EACH DAY Patient taking differently: Take 10 mEq by mouth daily.  10/15/17  Yes Terald Sleeper, PA-C  Propylene Glycol (SYSTANE BALANCE OP) Apply 1 drop to eye daily as needed (dry eyes).   Yes [provider]  rosuvastatin (CRESTOR) 10 MG tablet TAKE ONE (1) TABLET EACH DAY Patient taking differently: Take 10 mg by mouth daily.  01/30/18  Yes Terald Sleeper, PA-C  traMADol (ULTRAM) 50 MG tablet Take 1 tablet (50 mg total) by mouth every 12 (twelve) hours as needed. Patient taking differently: Take 50 mg by mouth daily as needed for moderate pain.  06/12/17  Yes Alla Feeling, NP  traZODone (DESYREL) 100 MG tablet Take 1 tablet (100 mg total) by mouth at bedtime. 01/30/18  Yes Terald Sleeper,  PA-C  Vitamin D, Ergocalciferol, (DRISDOL) 50000 units CAPS capsule TAKE 1 CAPSULE ON TUESDAYS Patient taking differently: Take 50,000 Units by mouth every Tuesday.  09/11/17  Yes Terald Sleeper, PA-C  zolpidem (AMBIEN) 10 MG tablet Take 1 tablet (10 mg total) by mouth at bedtime. Patient taking differently: Take 10 mg by mouth at bedtime as needed for sleep.  01/30/18  Yes Terald Sleeper, PA-C    Physical Exam: Vitals:   06/14/18 1715 06/14/18 1800 06/14/18 1815 06/14/18 1845  BP: 101/85 114/66 127/70 110/64  Pulse: (!) 102 94 94 95  Resp: (!) 22 (!) 41 (!) 24 19  Temp:      TempSrc:      SpO2: 96% 98% 97% 96%  Weight:      Height:          Constitutional: NAD, calm, comfortable, chronically ill looking Vitals:   06/14/18 1715 06/14/18 1800 06/14/18 1815 06/14/18 1845  BP: 101/85 114/66 127/70 110/64  Pulse: (!) 102 94 94 95  Resp: (!) 22 (!) 41 (!) 24 19  Temp:      TempSrc:      SpO2: 96% 98% 97% 96%  Weight:      Height:       Eyes: PERRL, lids and conjunctivae normal ENMT: Mucous membranes are dry posterior pharynx clear of any exudate or lesions.Normal dentition.  Neck: normal, supple, no masses, no thyromegaly Respiratory: clear to auscultation bilaterally, no wheezing, no crackles. Normal respiratory effort. No accessory muscle use.  Cardiovascular: Irregularly irregular rate and rhythm, no murmurs / rubs / gallops. No extremity edema. 2+ pedal pulses. No carotid bruits.  Abdomen: no tenderness, no masses palpated. No hepatosplenomegaly. Bowel sounds positive.  Colostomy bag in place with brown stool Musculoskeletal: no  clubbing / cyanosis. No joint deformity upper and lower extremities. Good ROM, no contractures. Normal muscle tone.  Skin: no rashes, lesions, ulcers. No induration Neurologic: CN 2-12 grossly intact. Sensation intact, DTR normal. Strength 5/5 in all 4.  Psychiatric: Normal judgment and insight. Alert and oriented x 3. Normal mood.     Labs on  Admission: I have personally reviewed following labs and imaging studies  CBC: Recent Labs  Lab 06/08/18 0913 06/09/18 0324 06/14/18 1328  WBC 12.2* 10.8* 9.5  NEUTROABS  --   --  7.8*  HGB 11.0* 10.7* 9.1*  HCT 35.1* 32.8* 30.7*  MCV 83.4 83.9 86.7  PLT 177 198 829   Basic Metabolic Panel: Recent Labs  Lab 06/08/18 0913 06/09/18 0324 06/14/18 1328  NA 133* 134* 137  K 3.3* 4.2 3.5  CL 100 101 108  CO2 26 24 23   GLUCOSE 137* 120* 110*  BUN 12 11 15   CREATININE 1.22* 1.20* 1.60*  CALCIUM 7.8* 8.0* 7.9*  MG  --   --  2.0   GFR: Estimated Creatinine Clearance: 26 mL/min (A) (by C-G formula based on SCr of 1.6 mg/dL (H)). Liver Function Tests: Recent Labs  Lab 06/14/18 1328  AST 15  ALT 21  ALKPHOS 58  BILITOT 0.7  PROT 5.7*  ALBUMIN 2.3*   No results for input(s): LIPASE, AMYLASE in the last 168 hours. No results for input(s): AMMONIA in the last 168 hours. Coagulation Profile: No results for input(s): INR, PROTIME in the last 168 hours. Cardiac Enzymes: Recent Labs  Lab 06/08/18 0913 06/08/18 1412 06/08/18 2102  TROPONINI 5.01* 4.34* 4.39*   BNP (last 3 results) No results for input(s): PROBNP in the last 8760 hours. HbA1C: No results for input(s): HGBA1C in the last 72 hours. CBG: No results for input(s): GLUCAP in the last 168 hours. Lipid Profile: No results for input(s): CHOL, HDL, LDLCALC, TRIG, CHOLHDL, LDLDIRECT in the last 72 hours. Thyroid Function Tests: No results for input(s): TSH, T4TOTAL, FREET4, T3FREE, THYROIDAB in the last 72 hours. Anemia Panel: No results for input(s): VITAMINB12, FOLATE, FERRITIN, TIBC, IRON, RETICCTPCT in the last 72 hours. Urine analysis:    Component Value Date/Time   COLORURINE AMBER (A) 06/14/2018 1328   APPEARANCEUR HAZY (A) 06/14/2018 1328   APPEARANCEUR Clear 05/13/2016 0856   LABSPEC 1.028 06/14/2018 1328   PHURINE 5.0 06/14/2018 1328   GLUCOSEU NEGATIVE 06/14/2018 1328   HGBUR NEGATIVE 06/14/2018  1328   BILIRUBINUR NEGATIVE 06/14/2018 1328   BILIRUBINUR Negative 05/13/2016 0856   [place] 06/14/2018 1328   PROTEINUR 30 (A) 06/14/2018 1328   UROBILINOGEN 0.2 02/10/2015 1936   NITRITE NEGATIVE 06/14/2018 1328   LEUKOCYTESUR NEGATIVE 06/14/2018 1328   Sepsis Labs: @LABRCNTIP (procalcitonin:4,lacticidven:4) ) Recent Results (from the past 240 hour(s))  MRSA PCR Screening     Status: None   Collection Time: 06/07/18  4:06 AM  Result Value Ref Range Status   MRSA by PCR NEGATIVE NEGATIVE Final    Comment:        The GeneXpert MRSA Assay (FDA approved for NASAL specimens only), is one component of a comprehensive MRSA colonization surveillance program. It is not intended to diagnose MRSA infection nor to guide or monitor treatment for MRSA infections. Performed at [facility], [place] [address], [place], [place] [address]      Radiological Exams on Admission: Dg Chest 2 View  Result Date: 06/14/2018 CLINICAL DATA:  Shortness of breath. EXAM: CHEST - 2 VIEW COMPARISON:  06/06/2018  FINDINGS: The heart is enlarged but stable. There is a moderate-sized hiatal hernia. The mediastinal and hilar contours are within normal limits and stable. Stable eventration of the left hemidiaphragm with mild overlying vascular crowding and streaky atelectasis. There are small bilateral pleural effusions but no pulmonary edema or definite infiltrates. IMPRESSION: Small bilateral pleural effusions without pulmonary edema or infiltrates. Electronically Signed   By: Marijo Sanes M.D.   On: 06/14/2018 14:03    EKG: Independently reviewed.  Atrial fibrillation with a rate of 88, borderline left axis deviation.  Ventricular premature complexes with flipped T waves at the lateral leads.  Infrequently unchanged from previous  Assessment/Plan Principal Problem:   Weakness generalized Active Problems:   Essential hypertension   Atrial fibrillation with RVR (HCC)   Dehydration   Acquired  hypothyroidism   S/P robotic partial posterior ([place]) fundoplication 04/06/1094   Pulmonary hypertension (HCC)   Anxiety state   Non-STEMI (non-ST elevated myocardial infarction) ([place])   Current use of long term anticoagulation   Renal failure (ARF), acute on chronic (HCC)     #1 generalized weakness: Probably multifactorial.  No clear-cut cause.  At this point suspected A. fib with possible CHF in the postoperative period.  Patient will be admitted.  PT OT consulted.  Cardiology to see patient.  Patient may require going to skilled facility for rehab.  #2 recent non-ST elevation MI: Troponin is trending downwards.  Defer to cardiology.  #3 dehydration: May be patient's problem at this point.  Hydrate the patient and monitor.  #4 acute on chronic kidney failure: BUN/creatinine is elevated over her baseline.  Most likely prerenal.  Hydrate the patient and monitor renal function.  #4 anxiety disorder: Continue home regimen  #5 essential hypertension: Blood pressure appears controlled.  Continue close monitoring.  #6 atrial fibrillation with some rapid response: Currently rate is high.  Resume home medications.  Hydrate patient.  Monitor closely.  If persistent may have to use Cardizem IV.  Continue Eliquis  #7 recent abdominal surgery: Patient is stable.  She is on pured diet.  No evidence of any abnormalities.  Colostomy bag also appears stable.  #8 normocytic anemia: No evidence of GI bleed.  Hemoglobin may drop after hydration.  Check stool guaiacs.   DVT prophylaxis: Eliquis Code Status: Full code Family Communication: No family at bedside Disposition Plan: To be determined Consults called: Cardiology Admission status: Inpatient  Severity of Illness: The appropriate patient status for this patient is INPATIENT. Inpatient status is judged to be reasonable and necessary in order to provide the required intensity of service to ensure the patient's safety. The patient's presenting  symptoms, physical exam findings, and initial radiographic and laboratory data in the context of their chronic comorbidities is felt to place them at high risk for further clinical deterioration. Furthermore, it is not anticipated that the patient will be medically stable for discharge from the hospital within 2 midnights of admission. The following factors support the patient status of inpatient.   " The patient's presenting symptoms include abdominal pain generalized weakness. " The worrisome physical exam findings include colostomy in place with generalized weakness. " The initial radiographic and laboratory data are worrisome because of elevated troponin. " The chronic co-morbidities include: Cancer.   * I certify that at the point of admission it is my clinical judgment that the patient will require inpatient hospital care spanning beyond 2 midnights from the point of admission due to high intensity of service, high risk for further deterioration  and high frequency of surveillance required.Barbette Merino MD [facility] Pager [phone]  If 7PM-7AM, please contact night-coverage [email or web address] Password [facility]  06/14/2018, 7:42 PM

## 2018-06-15 DIAGNOSIS — Z7901 Long term (current) use of anticoagulants: Secondary | ICD-10-CM

## 2018-06-15 DIAGNOSIS — I4891 Unspecified atrial fibrillation: Secondary | ICD-10-CM

## 2018-06-15 DIAGNOSIS — I214 Non-ST elevation (NSTEMI) myocardial infarction: Secondary | ICD-10-CM

## 2018-06-15 DIAGNOSIS — I272 Pulmonary hypertension, unspecified: Secondary | ICD-10-CM

## 2018-06-15 LAB — COMPREHENSIVE METABOLIC PANEL
ALT: 17 U/L (ref 0–44)
AST: 16 U/L (ref 15–41)
Albumin: 2.3 g/dL — ABNORMAL LOW (ref 3.5–5.0)
Alkaline Phosphatase: 59 U/L (ref 38–126)
Anion gap: 8 (ref 5–15)
BILIRUBIN TOTAL: 0.4 mg/dL (ref 0.3–1.2)
BUN: 12 mg/dL (ref 8–23)
CO2: 22 mmol/L (ref 22–32)
Calcium: 8 mg/dL — ABNORMAL LOW (ref 8.9–10.3)
Chloride: 108 mmol/L (ref 98–111)
Creatinine, Ser: 1.44 mg/dL — ABNORMAL HIGH (ref 0.44–1.00)
GFR calc Af Amer: 39 mL/min — ABNORMAL LOW (ref >60)
GFR calc non Af Amer: 34 mL/min — ABNORMAL LOW (ref >60)
Glucose, Bld: 114 mg/dL — ABNORMAL HIGH (ref 70–99)
Potassium: 3.3 mmol/L — ABNORMAL LOW (ref 3.5–5.1)
Sodium: 138 mmol/L (ref 135–145)
TOTAL PROTEIN: 5.3 g/dL — AB (ref 6.5–8.1)

## 2018-06-15 LAB — CBC
HEMATOCRIT: 28.3 % — AB (ref 36.0–46.0)
Hemoglobin: 8.8 g/dL — ABNORMAL LOW (ref 12.0–15.0)
MCH: 26.6 pg (ref 26.0–34.0)
MCHC: 31.1 g/dL (ref 30.0–36.0)
MCV: 85.5 fL (ref 80.0–100.0)
Platelets: 329 10*3/uL (ref 150–400)
RBC: 3.31 MIL/uL — ABNORMAL LOW (ref 3.87–5.11)
RDW: 16.2 % — ABNORMAL HIGH (ref 11.5–15.5)
WBC: 7.6 10*3/uL (ref 4.0–10.5)
nRBC: 0 % (ref 0.0–0.2)

## 2018-06-15 LAB — OCCULT BLOOD X 1 CARD TO LAB, STOOL: Fecal Occult Bld: NEGATIVE

## 2018-06-15 LAB — OCCULT BLOOD, POC DEVICE: Fecal Occult Bld: POSITIVE — AB

## 2018-06-15 MED ORDER — POTASSIUM CHLORIDE CRYS ER 20 MEQ PO TBCR
40.0000 meq | EXTENDED_RELEASE_TABLET | Freq: Once | ORAL | Status: AC
  Administered 2018-06-15: 40 meq via ORAL
  Filled 2018-06-15: qty 2

## 2018-06-15 NOTE — Progress Notes (Signed)
Patient admit to 3e03 from [facility]. A&Ox4. Afib on telemetry. VSS. No complaints of pain. Patient has colostomy. Patient has closing wounds to abdomen from recent surgery. Patient is low/mod fall risk.

## 2018-06-15 NOTE — Progress Notes (Signed)
PROGRESS NOTE  [name] PTW:[id number] DOB: 04-11-1934 DOA: 06/14/2018 PCP: Terald Sleeper, PA-C  HPI/Recap of past 24 hours: [name] is a 84 y.o. female with medical history significant of incarcerated hiatal hernia with recent laparoscopic fundoplication, atrial fibrillation on Eliquis, stage III colon cancer status post colectomy with colostomy in place, history of hiatal hernia that was initially repaired on March 6, anemia of chronic disease, anxiety disorder who was recently discharged on March 12.  Patient postoperative period  Complicated with non-ST elevation MI with very high troponins.  She went home postoperatively and was doing better until the last few days.  Patient has been unable to walk even to the bathroom.  She has been so weak and debilitated.  She lives alone.  Denied any fever or chills.  She denied any nausea or vomiting.  She has colostomy with normal colored stool.  No dysuria.  Patient could not even get to the kitchen.  She lives by herself so she came to the ER by EMS where she was seen and evaluated.  Her surgical site appears normal no evidence of infection.  Patient still in atrial fibrillation with a rate controlled.  She is on Eliquis with plan for possible cardioversion in the next few weeks.  Cardiology has been called and recommended admission and they will follow.  Patient has been admitted to the medical service with significant weakness.   06/15/18: Patient seen and examined at bedside.  No acute events overnight.  States her weakness is improving.  She denies shortness of breath.  Denies chest pain.  Assessment/Plan: Principal Problem:   Weakness generalized Active Problems:   Essential hypertension   Atrial fibrillation with RVR (HCC)   Dehydration   Acquired hypothyroidism   S/P robotic partial posterior ([place]) fundoplication 7/0/0174   Pulmonary hypertension (HCC)   Anxiety state   Non-STEMI (non-ST elevated myocardial infarction) (HCC)  Current use of long term anticoagulation   Renal failure (ARF), acute on chronic (HCC)  Generalized weakness suspect multifactorial secondary to A. fib with RVR versus AKI versus others Presented with generalized weakness, A. fib with RVR, and AKI Rate is better controlled today Also presented with bilateral pleural effusion Independently reviewed chest x-Geniene done on admission which revealed cardiomegaly with mild increase in pulmonary vascularity and bilateral pleural effusion. PT OT to assess Fall precautions  Chronic A. fib with RVR Rate is better controlled today Continue amiodarone 400 mg twice daily, continue Toprol-XL 12.5 mg daily Continue Eliquis 5 mg twice daily for CVA prophylaxis  Bilateral pleural effusion suspect cardiogenic Stop IV fluid Strict I's and O's and daily weight May consider diuretics if O2 saturation does not improve Maintain O2 sat greater than 92%  AKI on CKD 3 Baseline creatinine appears to be 1.20 with GFR 42 Presented with creatinine of 1.60 with GFR of 30 Creatinine is trending down to 1.44 on 06/15/2018 Stop IV fluid due to bilateral pleural effusion to avoid pulmonary edema Avoid nephrotoxic agents/hypotension Repeat BMP in the morning  Hypokalemia Potassium 3.3 Repleted with oral potassium 40 mEq once  Moderate protein calorie malnutrition Albumin 2.3 BMI 28 Encourage increasing protein calorie intake  Chronic normocytic anemia Baseline creatinine hemoglobin appears to be 10 Presented with hemoglobin of 9.1 trending down to 8.8 No sign of overt bleeding Obtain FOBT MCV 85 Continue to monitor repeat CBC in the morning  Recent NSTEMI  Troponin trended down Denies chest pain Continue cardiac medications Continue Crestor  Chronic diastolic CHF  Continue Imdur, Toprol, losartan Last 2D echo done on 06/07/2018 revealed LVEF 60 to 65%  Essential hypertension: Blood pressure appears controlled.  Continue close monitoring.  Recent  abdominal surgery: Patient is stable.  She is on pured diet.  No evidence of any abnormalities.  Colostomy bag also appears stable.    DVT prophylaxis: Eliquis Code Status: Full code Family Communication: No family at bedside Disposition Plan:  Home when clinically stable possibly in 1 to 2 days Consults called: Cardiology     Objective: Vitals:   06/14/18 1845 06/14/18 2030 06/14/18 2108 06/15/18 0518  BP: 110/64 120/75 135/75 130/65  Pulse: 95 (!) 106 85 (!) 105  Resp: 19 (!) 25 16 16   Temp:   97.7 F (36.5 C) 98.2 F (36.8 C)  TempSrc:   Oral Oral  SpO2: 96% 97% 97% 95%  Weight:   71.9 kg 72.4 kg  Height:   5' 2.5" (1.588 m)     Intake/Output Summary (Last 24 hours) at 06/15/2018 0902 Last data filed at 06/15/2018 6948 Gross per 24 hour  Intake 953.68 ml  Output 100 ml  Net 853.68 ml   Filed Weights   06/14/18 1306 06/14/18 2108 06/15/18 0518  Weight: 72.3 kg 71.9 kg 72.4 kg    Exam:  . General: 84 y.o. year-old female well developed well nourished in no acute distress.  Alert and oriented x3. . Cardiovascular: Regular rate and rhythm with no rubs or gallops.  No thyromegaly or JVD noted.   Marland Kitchen Respiratory: Mild rales at bases with no wheezes. Good inspiratory effort. . Abdomen: Soft nontender nondistended with normal bowel sounds x4 quadrants. . Musculoskeletal: No lower extremity edema. 2/4 pulses in all 4 extremities. Marland Kitchen Psychiatry: Mood is appropriate for condition and setting   Data Reviewed: CBC: Recent Labs  Lab 06/08/18 0913 06/09/18 0324 06/14/18 1328 06/15/18 0543  WBC 12.2* 10.8* 9.5 7.6  NEUTROABS  --   --  7.8*  --   HGB 11.0* 10.7* 9.1* 8.8*  HCT 35.1* 32.8* 30.7* 28.3*  MCV 83.4 83.9 86.7 85.5  PLT 177 198 332 546   Basic Metabolic Panel: Recent Labs  Lab 06/08/18 0913 06/09/18 0324 06/14/18 1328 06/15/18 0543  NA 133* 134* 137 138  K 3.3* 4.2 3.5 3.3*  CL 100 101 108 108  CO2 26 24 23 22   GLUCOSE 137* 120* 110* 114*  BUN 12  11 15 12   CREATININE 1.22* 1.20* 1.60* 1.44*  CALCIUM 7.8* 8.0* 7.9* 8.0*  MG  --   --  2.0  --    GFR: Estimated Creatinine Clearance: 28.9 mL/min (A) (by C-G formula based on SCr of 1.44 mg/dL (H)). Liver Function Tests: Recent Labs  Lab 06/14/18 1328 06/15/18 0543  AST 15 16  ALT 21 17  ALKPHOS 58 59  BILITOT 0.7 0.4  PROT 5.7* 5.3*  ALBUMIN 2.3* 2.3*   No results for input(s): LIPASE, AMYLASE in the last 168 hours. No results for input(s): AMMONIA in the last 168 hours. Coagulation Profile: No results for input(s): INR, PROTIME in the last 168 hours. Cardiac Enzymes: Recent Labs  Lab 06/08/18 0913 06/08/18 1412 06/08/18 2102  TROPONINI 5.01* 4.34* 4.39*   BNP (last 3 results) No results for input(s): PROBNP in the last 8760 hours. HbA1C: No results for input(s): HGBA1C in the last 72 hours. CBG: No results for input(s): GLUCAP in the last 168 hours. Lipid Profile: No results for input(s): CHOL, HDL, LDLCALC, TRIG, CHOLHDL, LDLDIRECT in the last 72  hours. Thyroid Function Tests: No results for input(s): TSH, T4TOTAL, FREET4, T3FREE, THYROIDAB in the last 72 hours. Anemia Panel: No results for input(s): VITAMINB12, FOLATE, FERRITIN, TIBC, IRON, RETICCTPCT in the last 72 hours. Urine analysis:    Component Value Date/Time   COLORURINE AMBER (A) 06/14/2018 1328   APPEARANCEUR HAZY (A) 06/14/2018 1328   APPEARANCEUR Clear 05/13/2016 0856   LABSPEC 1.028 06/14/2018 1328   PHURINE 5.0 06/14/2018 1328   GLUCOSEU NEGATIVE 06/14/2018 1328   HGBUR NEGATIVE 06/14/2018 1328   [place] 06/14/2018 1328   BILIRUBINUR Negative 05/13/2016 0856   [place] 06/14/2018 1328   PROTEINUR 30 (A) 06/14/2018 1328   UROBILINOGEN 0.2 02/10/2015 1936   NITRITE NEGATIVE 06/14/2018 1328   LEUKOCYTESUR NEGATIVE 06/14/2018 1328   Sepsis Labs: @LABRCNTIP (procalcitonin:4,lacticidven:4)  ) Recent Results (from the past 240 hour(s))  MRSA PCR Screening      Status: None   Collection Time: 06/07/18  4:06 AM  Result Value Ref Range Status   MRSA by PCR NEGATIVE NEGATIVE Final    Comment:        The GeneXpert MRSA Assay (FDA approved for NASAL specimens only), is one component of a comprehensive MRSA colonization surveillance program. It is not intended to diagnose MRSA infection nor to guide or monitor treatment for MRSA infections. Performed at [facility], [place] [address] [place], [place] [address]       Studies: Dg Chest 2 View  Result Date: 06/14/2018 CLINICAL DATA:  Shortness of breath. EXAM: CHEST - 2 VIEW COMPARISON:  06/06/2018 FINDINGS: The heart is enlarged but stable. There is a moderate-sized hiatal hernia. The mediastinal and hilar contours are within normal limits and stable. Stable eventration of the left hemidiaphragm with mild overlying vascular crowding and streaky atelectasis. There are small bilateral pleural effusions but no pulmonary edema or definite infiltrates. IMPRESSION: Small bilateral pleural effusions without pulmonary edema or infiltrates. Electronically Signed   By: Marijo Sanes M.D.   On: 06/14/2018 14:03    Scheduled Meds: . amiodarone  400 mg Oral BID  . apixaban  5 mg Oral BID  . citalopram  40 mg Oral Daily  . ferrous sulfate  325 mg Oral Q breakfast  . fluticasone  2 spray Each Nare Daily  . hydrALAZINE  50 mg Oral Q8H  . isosorbide mononitrate  30 mg Oral q morning - 10a  . levothyroxine  50 mcg Oral QAC breakfast  . loratadine  10 mg Oral QHS  . losartan  50 mg Oral Daily  . metoprolol succinate  12.5 mg Oral Daily  . montelukast  10 mg Oral QHS  . pantoprazole  40 mg Oral Daily  . polyethylene glycol  17 g Oral Daily  . potassium chloride  10 mEq Oral Daily  . rosuvastatin  10 mg Oral Daily  . traZODone  100 mg Oral QHS  . [START ON 06/16/2018] Vitamin D (Ergocalciferol)  50,000 Units Oral Q Tue    Continuous Infusions: . sodium chloride 50 mL/hr at 06/14/18 2342     LOS: 1  day     Kayleen Memos, MD [facility] Pager [phone]  If 7PM-7AM, please contact night-coverage [email or web address] Password [email or web address] 06/15/2018, 9:02 AM

## 2018-06-15 NOTE — Consult Note (Addendum)
Cardiology Consultation:   Patient ID: [name] MRN: [id number]; DOB: 1935/01/26  Admit date: 06/14/2018 Date of Consult: 06/15/2018  Primary Care Provider: Terald Sleeper, PA-C Primary Cardiologist: Kate Sable, MD  Primary Electrophysiologist:  None    Patient Profile:   [name] is a 84 y.o. female with a hx of significant for HFpEF, A. Fib recently started on Eliquis, CAD s/p PCI-LAD 05/2011 and colon cancer s/p colostomy who was recently admitted on 06/05/18 with an incarcerated hernia and required extensive surgery (laparoscopic fundoplication). Cardiology was consulted on 06/06/18 for post operative atrial fibrillation and pt was started on amiodarone and Eliquis w/ plans to return back for outpatient DCCV. Pt discharged on 3/12 and has now been readmitted for SOB and weakness. She is being seen today for the evaluation of persistent atrial fibrillation at the request of Dr. Nevada Crane, Internal Medicine.   History of Present Illness:   [name] is a 84y/o female with PMH significant for HFpEF, A. Fib recently started on Eliquis, CAD s/p PCI-LAD 05/2011 and colon cancer s/p colostomy who was recently admitted on 06/05/18 with an incarcerated hernia and required extensive surgery (laparoscopic fundoplication). Cardiology was consulted on 06/06/18 for post operative atrial fibrillation. She was started on IV amiodarone and later transitioned to PO. She was also placed on Eliquis for anticoagulation (of note, pt had been on anticoagulation in the past for afib, but had been discontinued due to anemia). Recommendations were to consider outpatient cardioversion after being on anticoagulation x 3-4 weeks. Also of note, she had significant troponin elevation that admission, up to 10 but she was completely asymptomatic. Echo was done on 3/8 and showed normal LVEF at 60-65% and no obvious wall motion abnormalities. Decision was made not to pursue invasive cardiac catheterization. It was felt that her  troponin elevation was likely demand ischemia related to her rapid afib. Pt was discharge home on 06/11/18, on amiodarone 400 mg BID, Eliquis 5 mg BID, metoprolol succinate 12.5 daily, losartan 100, hydralazine 50 TID, lasix 40 mg, Imdur 30 mg and rosuvastatin 10 mg. Pt apparently lives home along and has been struggling with ADLs since discharge.   She [facility] with complaints of SOB and weakness. SOB is mostly with exertion. Dyspnea improves with rest. She denies CP. She has been fully compliant w/ medications. No missed dose of Eliquis.   ED Course: Temperature is 97.9 blood pressure 101/60 mmHg, HR 140, RR 41 oxygen sat 92% on 2 L.  White count  9.5.  Hemoglobin 9.1 Platelets 332.  Chemistry panel within normal limits except creatinine was up to 1.60 (her discharge creatinine was 1.1).  Calcium 7.9 and glucose 110.  BNP was 570 and troponin I 1.16 which was trending downwards.  Urinalysis negative and chest x-Pearley negative.  EKG showed atrial fibrillation with CVR HR 88 and PVCs.   Pt readmitted to the hospital by IM. Cardiology asked to assist with atrial fibrillation management. HR currently in the 110s. Asymptomatic at rest.   Past Medical History:  Diagnosis Date   Anemia due to blood loss, chronic    Anxiety state, unspecified    Atrophy of left kidney    Cancer ([place])    melanoma on back   Cancer of right colon ([name]) 05/20/2017   CKD (chronic kidney disease), stage III [facility])      sees Dr. Lowanda Foster    Colon cancer [facility]) 2019   stage 3   Colostomy in place (  [place])    in 2016   Coronary artery disease cardiologist-  dr Bronson Ing   a. Moderate to severe coronary disease involving the left anterior descending artery and right coronary artery.  Sequential stenosis in the LAD is significant.  Right coronary artery is moderate to severely likely nonischemic. Questionable small LVOT obstruction.  No Brockenbrough maneuver was performed.  Catheterization  January 2013 w/ PTCA and DES x1 to pLAD b. 05/2014 MV no isch/infarct, EF 60%.   Depressive disorder, not elsewhere classified    Diastolic CHF, chronic [facility])    cardiologist-  dr Bronson Ing   pt. denies at preop. 06/07/18 - EF 60-65% on echo, no obvious dysfunction appreciated   Diversion colitis 2016   Epidural abscess L5- S1 03/01/2016   Gastro-esophageal reflux disease without esophagitis 07/16/2007   Overview:  Overview:  Qualifier: Diagnosis of  By: Nelson-Smith CMA (AAMA), Dottie  Overview:  Overview:  Overview:  Qualifier: Diagnosis of  By: Nelson-Smith CMA (AAMA), Dottie    GERD (gastroesophageal reflux disease)    H/O hiatal hernia    History of [name]'s esophagus    History of melanoma excision    back   Hypertension    Hypothyroidism    Infection of spine ([place])    in hospial   Macular degeneration    both eyes   PAF (paroxysmal atrial fibrillation) ([place])    Parastomal hernia    Pneumonia    PONV (postoperative nausea and vomiting)    Pulmonary hypertension (HCC)    PA systolic [id number] mmHg by echocardiogram 06-17-2015  PA pressure 05-08-2011 by cardiac catheterization -- no sig. pulm. htn)PA saturation 62% thermodilution cardiac index 2.0 thick cardiac index 2.4   Pure hypercholesterolemia    S/P drug eluting coronary stent placement 05/08/2011   x1 DES to proximal LAD   Symptomatic cholelithiasis 07/18/2016   Tubulovillous adenoma of colon    Wears dentures    lower   Wears glasses     Past Surgical History:  Procedure Laterality Date   CARDIAC CATHETERIZATION Left 03/05/2015   Procedure: CENTRAL LINE INSERTION;  Surgeon: Aviva Signs, MD;  Location: [facility];  Service: General;  Laterality: Left;   CATARACT EXTRACTION W/ INTRAOCULAR LENS  IMPLANT, BILATERAL  ~ 2002   CHOLECYSTECTOMY N/A 07/18/2016   Procedure: LAPAROSCOPIC CHOLECYSTECTOMY WITH INTRAOPERATIVE CHOLANGIOGRAM;  Surgeon: [name], MD;  Location: [facility];  Service: General;   Laterality: N/A;   COLECTOMY WITH COLOSTOMY CREATION/HARTMANN PROCEDURE N/A 03/05/2015   Procedure: PARTIAL COLECTOMY WITH COLOSTOMY CREATION/HARTMANN PROCEDURE;  Surgeon: Aviva Signs, MD;  Location: [facility];  Service: General;  Laterality: N/A;   COLON SURGERY     colostomy bag  LLQ   ESOPHAGEAL MANOMETRY N/A 06/06/2014   Procedure: ESOPHAGEAL MANOMETRY (EM);  Surgeon: Jerene Bears, MD;  Location: [facility];  Service: Gastroenterology;  Laterality: N/A;   ESOPHAGOGASTRODUODENOSCOPY (EGD) WITH PROPOFOL N/A 08/04/2015   Procedure: ESOPHAGOGASTRODUODENOSCOPY (EGD) WITH PROPOFOL;  Surgeon: Mauri Pole, MD;  Location: [facility];  Service: Endoscopy;  Laterality: N/A;   HERNIA REPAIR  2019   left side/ removed mass   IR GENERIC HISTORICAL  03/04/2016   IR LUMBAR DISC ASPIRATION W/IMG GUIDE 03/04/2016 Luanne Bras, MD [facility]INTER[facility] RAD   LAPAROSCOPIC NISSEN FUNDOPLICATION N/A 9/50/9326   Procedure: LAPAROSCOPIC  LYSIS OF ADHESIONS, SEGMENTAL GASTRECTOMY, PARTIAL REDUCTION OF HERNIA;  Surgeon: [name], MD;  Location: [facility];  Service: General;  Laterality: N/A;   LAPAROSCOPIC PARTIAL COLECTOMY N/A 05/07/2017   Procedure: LAPAROSCOPIC  CONVERTED  TO OPEN RIGHT COLECTOMY, PARASTOMAL HERNIA  REPAIR;  Surgeon: Leighton Ruff, MD;  Location: [facility];  Service: General;  Laterality: N/A;   NISSEN FUNDOPLICATION  7782   PERCUTANEOUS CORONARY STENT INTERVENTION (PCI-S) N/A 05/08/2011   Procedure: PERCUTANEOUS CORONARY STENT INTERVENTION (PCI-S);  Surgeon: Wellington Hampshire, MD;  Location: [facility] CATH LAB;  Service: Cardiovascular;  Laterality: N/A;   ROTATOR CUFF REPAIR  ~ 2001   right   SHOULDER ARTHROSCOPY  ~ 2004; 2005   left; "joint's wore out"   TONSILLECTOMY  ~ [place] Medications:  Prior to Admission medications   Medication Sig Start Date End Date Taking? Authorizing Provider  albuterol (PROVENTIL HFA;VENTOLIN HFA) 108 (90 Base) MCG/ACT inhaler  Inhale 2 puffs into the lungs every 6 (six) hours as needed for wheezing or shortness of breath. 04/15/18  Yes Terald Sleeper, PA-C  ALPRAZolam (XANAX) 0.25 MG tablet TAKEK 1/2 TO 1 TABLET 3 TIMES DAILY AS NEEDED Patient taking differently: Take 0.125-0.25 mg by mouth 3 (three) times daily as needed for anxiety.  01/30/18  Yes Terald Sleeper, PA-C  amiodarone (PACERONE) 400 MG tablet Take 1 tablet (400 mg total) by mouth 2 (two) times daily. 06/11/18  Yes Michael Boston, MD  apixaban (ELIQUIS) 5 MG TABS tablet Take 1 tablet (5 mg total) by mouth 2 (two) times daily. 06/11/18  Yes Michael Boston, MD  citalopram (CELEXA) 40 MG tablet TAKE ONE (1) TABLET EACH DAY Patient taking differently: Take 40 mg by mouth daily.  01/30/18  Yes Terald Sleeper, PA-C  denosumab (PROLIA) 60 MG/ML SOSY injection Inject 60 mg into the skin every 6 (six) months. 02/18/18  Yes Terald Sleeper, PA-C  esomeprazole (NEXIUM) 40 MG capsule TAKE ONE (1) [place] Patient taking differently: Take 40 mg by mouth daily.  01/30/18  Yes Terald Sleeper, PA-C  ferrous sulfate 325 (65 FE) MG EC tablet Take 325 mg by mouth daily with breakfast.    Yes [provider]  fluticasone (FLONASE) 50 MCG/ACT nasal spray USE 2 SPRAYS IN EACH NOSTRIL DAILY Patient taking differently: Place 2 sprays into both nostrils daily.  10/15/17  Yes Terald Sleeper, PA-C  furosemide (LASIX) 40 MG tablet Take 40 mg by mouth as needed for fluid.    Yes [provider]  hydrALAZINE (APRESOLINE) 50 MG tablet TAKE ONE (1) TABLET THREE (3) TIMES EACH DAY Patient taking differently: Take 50 mg by mouth 3 (three) times daily.  01/30/18  Yes Terald Sleeper, PA-C  isosorbide mononitrate (IMDUR) 30 MG 24 hr tablet Take 1 tablet (30 mg total) by mouth every morning. 01/30/18  Yes Terald Sleeper, PA-C  levothyroxine (SYNTHROID, LEVOTHROID) 50 MCG tablet TAKE ONE TABLET EACH MORNING BEFORE BREAKFAST Patient taking differently: Take 50 mcg by mouth daily  before breakfast.  01/30/18  Yes Terald Sleeper, PA-C  loratadine (CLARITIN) 10 MG tablet Take 1 tablet (10 mg total) by mouth at bedtime. 01/30/18  Yes Terald Sleeper, PA-C  losartan (COZAAR) 100 MG tablet TAKE ONE (1) TABLET EACH DAY Patient taking differently: Take 50 mg by mouth daily.  01/30/18  Yes Terald Sleeper, PA-C  Meclizine HCl 25 MG CHEW TAKE 1 TABLET 3 TIMES DAILY AS NEEDED FOR DIZZINESS Patient taking differently: Chew 25 mg by mouth 3 (three) times daily as needed (dizziness).  10/15/17  Yes Terald Sleeper, PA-C  metoprolol succinate (TOPROL-XL) 25 MG 24  hr tablet Take 0.5 tablets (12.5 mg total) by mouth daily. 05/05/18  Yes Terald Sleeper, PA-C  montelukast (SINGULAIR) 10 MG tablet Take 1 tablet (10 mg total) by mouth at bedtime. 01/30/18  Yes Terald Sleeper, PA-C  nitroGLYCERIN (NITROSTAT) 0.4 MG SL tablet Place 1 tablet (0.4 mg total) under the tongue every 5 (five) minutes as needed for chest pain. Reported on 04/26/2015 01/13/17  Yes Herminio Commons, MD  nystatin (MYCOSTATIN/NYSTOP) powder Apply topically 4 (four) times daily. Patient taking differently: Apply 1 Bottle topically as needed (yeast).  05/05/18  Yes Terald Sleeper, PA-C  ondansetron (ZOFRAN-ODT) 4 MG disintegrating tablet Take 1 tablet (4 mg total) by mouth every 6 (six) hours as needed for nausea or vomiting. 06/11/18  Yes Gross, Remo Lipps, MD  polyethylene glycol powder (GAVILAX) powder MIX 17 GRAMS IN 80Z OF WATER/JUICE AND DRINK TWICE DAILY Patient taking differently: Take 17 g by mouth daily.  10/15/17  Yes Terald Sleeper, PA-C  potassium chloride (K-DUR) 10 MEQ tablet TAKE ONE (1) TABLET EACH DAY Patient taking differently: Take 10 mEq by mouth daily.  10/15/17  Yes Terald Sleeper, PA-C  Propylene Glycol (SYSTANE BALANCE OP) Apply 1 drop to eye daily as needed (dry eyes).   Yes [provider]  rosuvastatin (CRESTOR) 10 MG tablet TAKE ONE (1) TABLET EACH DAY Patient taking differently: Take 10 mg by mouth  daily.  01/30/18  Yes Terald Sleeper, PA-C  traMADol (ULTRAM) 50 MG tablet Take 1 tablet (50 mg total) by mouth every 12 (twelve) hours as needed. Patient taking differently: Take 50 mg by mouth daily as needed for moderate pain.  06/12/17  Yes Alla Feeling, NP  traZODone (DESYREL) 100 MG tablet Take 1 tablet (100 mg total) by mouth at bedtime. 01/30/18  Yes Terald Sleeper, PA-C  Vitamin D, Ergocalciferol, (DRISDOL) 50000 units CAPS capsule TAKE 1 CAPSULE ON TUESDAYS Patient taking differently: Take 50,000 Units by mouth every Tuesday.  09/11/17  Yes Terald Sleeper, PA-C  zolpidem (AMBIEN) 10 MG tablet Take 1 tablet (10 mg total) by mouth at bedtime. Patient taking differently: Take 10 mg by mouth at bedtime as needed for sleep.  01/30/18  Yes Terald Sleeper, PA-C    Inpatient Medications: Scheduled Meds:  amiodarone  400 mg Oral BID   apixaban  5 mg Oral BID   citalopram  40 mg Oral Daily   ferrous sulfate  325 mg Oral Q breakfast   fluticasone  2 spray Each Nare Daily   hydrALAZINE  50 mg Oral Q8H   isosorbide mononitrate  30 mg Oral q morning - 10a   levothyroxine  50 mcg Oral QAC breakfast   loratadine  10 mg Oral QHS   losartan  50 mg Oral Daily   metoprolol succinate  12.5 mg Oral Daily   montelukast  10 mg Oral QHS   pantoprazole  40 mg Oral Daily   polyethylene glycol  17 g Oral Daily   potassium chloride  10 mEq Oral Daily   rosuvastatin  10 mg Oral Daily   traZODone  100 mg Oral QHS   [START ON 06/16/2018] Vitamin D (Ergocalciferol)  50,000 Units Oral Q Tue   Continuous Infusions:  PRN Meds: albuterol, ALPRAZolam, furosemide, meclizine, nitroGLYCERIN, nystatin, ondansetron **OR** ondansetron (ZOFRAN) IV, ondansetron, polyvinyl alcohol, traMADol, zolpidem  Allergies:    Allergies  Allergen Reactions   Sulfamethoxazole Rash    Social History:   Social History  Socioeconomic History   Marital status: Divorced    Spouse name: Not on file    Number of children: 3   Years of education: Not on file   Highest education level: Not on file  Occupational History   Occupation: Retired  Scientist, product/process development strain: Not on file   Food insecurity:    Worry: Not on file    Inability: Not on file   Transportation needs:    Medical: Not on file    Non-medical: Not on file  Tobacco Use   Smoking status: Former Smoker    Packs/day: 1.00    Years: 20.00    Pack years: 20.00    Types: Cigarettes    Start date: 10/12/1958    Last attempt to quit: 04/26/1988    Years since quitting: 30.1   Smokeless tobacco: Never Used  Substance and Sexual Activity   Alcohol use: No    Alcohol/week: 0.0 standard drinks   Drug use: No   Sexual activity: Not Currently  Lifestyle   Physical activity:    Days per week: Not on file    Minutes per session: Not on file   Stress: Not on file  Relationships   Social connections:    Talks on phone: Not on file    Gets together: Not on file    Attends religious service: Not on file    Active member of club or organization: Not on file    Attends meetings of clubs or organizations: Not on file    Relationship status: Not on file   Intimate partner violence:    Fear of current or ex partner: Not on file    Emotionally abused: Not on file    Physically abused: Not on file    Forced sexual activity: Not on file  Other Topics Concern   Not on file  Social History Narrative   Divorced.  Lives alone.  Ambulates with a walker.     Family History:    Family History  Problem Relation Age of Onset   Heart attack Father    Hypertension Father    Hypertension Mother    Skin cancer Brother        melanoma   Atrial fibrillation Sister    Skin cancer Sister    Colon cancer Neg Hx    Esophageal cancer Neg Hx    Rectal cancer Neg Hx    Stomach cancer Neg Hx      ROS:  Please see the history of present illness.   All other ROS reviewed and negative.      Physical Exam/Data:   Vitals:   06/14/18 2108 06/15/18 0518 06/15/18 0943 06/15/18 1305  BP: 135/75 130/65 128/78 140/74  Pulse: 85 (!) 105 (!) 116 (!) 102  Resp: 16 16 18 18   Temp: 97.7 F (36.5 C) 98.2 F (36.8 C) 98.2 F (36.8 C) 98.1 F (36.7 C)  TempSrc: Oral Oral Oral Oral  SpO2: 97% 95% 97% 99%  Weight: 71.9 kg 72.4 kg    Height: 5' 2.5" (1.588 m)       Intake/Output Summary (Last 24 hours) at 06/15/2018 1334 Last data filed at 06/15/2018 0852 Gross per 24 hour  Intake 953.68 ml  Output 100 ml  Net 853.68 ml   Last 3 Weights 06/15/2018 06/14/2018 06/14/2018  Weight (lbs) 159 lb 9.6 oz 158 lb 8 oz 159 lb 6.4 oz  Weight (kg) 72.394 kg 71.895 kg 72.303 kg  Body mass index is 28.73 kg/m.  General:  Elderly WF in no acute distress HEENT: normal Lymph: no adenopathy Neck: no JVD Endocrine:  No thryomegaly Vascular: No carotid bruits; FA pulses 2+ bilaterally without bruits  Cardiac:  irregularly irregular, tachy rate  Lungs:  clear to auscultation bilaterally, no wheezing, rhonchi or rales  Abd: colostomy bag present  Ext: no edema Musculoskeletal:  No deformities, BUE and BLE strength normal and equal Skin: warm and dry  Neuro:  CNs 2-12 intact, no focal abnormalities noted Psych:  Normal affect   EKG:  The EKG was personally reviewed and demonstrates:  Afib CVR 88 bpm  Telemetry:  Telemetry was personally reviewed and demonstrates: atrial fibrillation 110s.   Relevant CV Studies: 2D Echo 06/07/18 IMPRESSIONS    1. The left ventricle has normal systolic function with an ejection fraction of 60-65%. The cavity size was normal. Left ventricular diastolic Doppler parameters are indeterminate in the setting of atrial fibrillation. No obvious wall motion  abnormalities.  2. The right ventricle has normal systolic function. The cavity was normal. There is no increase in right ventricular wall thickness. Right ventricular systolic pressure upper normal with an  estimated pressure of 34.6 mmHg.  3. Left atrial size was moderately dilated.  4. The aortic valve is tricuspid. Mild calcification of the aortic valve. Moderate aortic annular calcification noted.  5. The mitral valve is normal in structure. Mitral valve regurgitation is mild to moderate by color flow Doppler.  6. The tricuspid valve is normal in structure.  7. The aortic root is normal in size and structure.  Laboratory Data:  Chemistry Recent Labs  Lab 06/09/18 0324 06/14/18 1328 06/15/18 0543  NA 134* 137 138  K 4.2 3.5 3.3*  CL 101 108 108  CO2 24 23 22   GLUCOSE 120* 110* 114*  BUN 11 15 12   CREATININE 1.20* 1.60* 1.44*  CALCIUM 8.0* 7.9* 8.0*  GFRNONAA 42* 30* 34*  GFRAA 49* 35* 39*  ANIONGAP 9 6 8     Recent Labs  Lab 06/14/18 1328 06/15/18 0543  PROT 5.7* 5.3*  ALBUMIN 2.3* 2.3*  AST 15 16  ALT 21 17  ALKPHOS 58 59  BILITOT 0.7 0.4   Hematology Recent Labs  Lab 06/09/18 0324 06/14/18 1328 06/15/18 0543  WBC 10.8* 9.5 7.6  RBC 3.91 3.54* 3.31*  HGB 10.7* 9.1* 8.8*  HCT 32.8* 30.7* 28.3*  MCV 83.9 86.7 85.5  MCH 27.4 25.7* 26.6  MCHC 32.6 29.6* 31.1  RDW 15.5 16.4* 16.2*  PLT 198 332 329   Cardiac Enzymes Recent Labs  Lab 06/08/18 1412 06/08/18 2102  TROPONINI 4.34* 4.39*    Recent Labs  Lab 06/14/18 1340  TROPIPOC 1.16*    BNP Recent Labs  Lab 06/14/18 1328  BNP 570.9*    DDimer No results for input(s): DDIMER in the last 168 hours.  Radiology/Studies:  Dg Chest 2 View  Result Date: 06/14/2018 CLINICAL DATA:  Shortness of breath. EXAM: CHEST - 2 VIEW COMPARISON:  06/06/2018 FINDINGS: The heart is enlarged but stable. There is a moderate-sized hiatal hernia. The mediastinal and hilar contours are within normal limits and stable. Stable eventration of the left hemidiaphragm with mild overlying vascular crowding and streaky atelectasis. There are small bilateral pleural effusions but no pulmonary edema or definite infiltrates.  IMPRESSION: Small bilateral pleural effusions without pulmonary edema or infiltrates. Electronically Signed   By: Marijo Sanes M.D.   On: 06/14/2018 14:03    Assessment and Plan:  Mack I Lienhard is a 84 y.o. female with a hx of significant for HFpEF, A. Fib recently started on Eliquis, CAD s/p PCI-LAD 05/2011 and colon cancer s/p colostomy who was recently admitted on 06/05/18 with an incarcerated hernia and required extensive surgery (laparoscopic fundoplication). Cardiology was consulted on 06/06/18 for post operative atrial fibrillation and pt was started on amiodarone and Eliquis w/ plans to return back for outpatient DCCV. Pt discharged on 3/12 and has now been readmitted for SOB and weakness. She is being seen today for the evaluation of persistent atrial fibrillation at the request of Dr. Nevada Crane, Internal Medicine.    1. Persistent Atrial Fibrillation: HR in the 110s resting. She is symptomatic w/ exertional dyspnea. She is on PO amiodarone and metoprolol and recently started on Eliquis for a/c. Recent echo showed normal LVEF and moderately dilated LA. Given recent extensive GI surgery, original plans when cardiology evaluated previous admission was to bring back for elective DCCV after 3 weeks of anticoagulation w/ Eliquis. However pt with progressive decline in Hgb since starting Eliquis. Hgb down to 8.8. Exertional dyspnea likely multifactorial 2/2 atrial fibrillation, anemia and diastolic CHF. Given worsening anemia, ? Long term use of anticoagulation. This may also impact our decision to proceed with DCCV, as pt will need to remain on a/c for a minimum of 4 weeks post cardioversion. For now, attempt at rate control may be the best treatment course,although we may face roadblock as pt also reports h/o significant bradycardia w/ HR in the 40s on higher doses of metoprolol in the past. Will discuss further w/ attending. For now, continue PO amiodarone, metoprolol and Eliquis. Keep electrolytes stable.  Continue to monitor on tele.   2. HFpEF: echo on 3/8 showed normal LVEF. BNP yesterday mildly elevated at 570. Complains of exertional dyspnea, although afib and anemia also likely contributing. She does not appear grossly volume overloaded, but may benefit from a dose of IV Lasix. Monitor renal function and K.   3. Hypokalemia: 3.3 this morning. Supplemental K given. F/u BMP in the AM.   4. Anemia: progressive decline in hgb post op from 10>>8.8. Eliquis was recently started for a/c given persistent atrial fibrillation. FOBT pending. Continue to monitor.   5. GI: H/o colon cancer s/p colostomy who was recently admitted on 06/05/18 with an incarcerated hernia and required extensive surgery (laparoscopic fundoplication).  6. AKI: Scr improved today, down from 1.60>>1.44. Baseline ~1.2. Continue to monitor   7. Elevated Troponin: POC troponin yesterday 1.16. This is down from prior admission when troponin peaked at 10.58. Pt continues to deny chest pain and echo last admission reassuring with normal LVEF and no wall motion abnormalities. Elevated troponin felt to be demand ischemia from rapid afib.    For questions or updates, please contact [facility] Please consult [email or web address] for contact info under     Signed, Lyda Jester, PA-C  06/15/2018 1:34 PM   I have seen and examined the patient along with Lyda Jester, PA-C .  I have reviewed the chart, notes and new data.  I agree with PA's note.  Key new complaints: tolerating AFib very poorly - dyspneic with minimal activity. Key examination changes: irregular rhythm , very difficult to rate control and previous problem with bradycardia Key new findings / data: troponin still declining from recent moderate elevation  PLAN: Symptomatic atrial fibrillation. Since rate control will be challenging, it is probably best to schedule for TEE-DCCV. This procedure has been fully reviewed with the patient and written  informed consent  has been obtained.. Unless Hgb drops further in AM (which would make [place] cautious about the need to stop anticoagulants), plan TEE-DCCV tomorrow. This procedure has been fully reviewed with the patient and written informed consent has been obtained.   Sanda Klein, MD, [place] [phone] 06/15/2018, 5:25 PM

## 2018-06-15 NOTE — Evaluation (Signed)
Physical Therapy Evaluation Patient Details Name: [name] MRN: [id number] DOB: 11/06/1934 Today's Date: 06/15/2018   History of Present Illness  [name] is a 84 y.o. female with medical history significant of incarcerated hiatal hernia with recent laparoscopic fundoplication, atrial fibrillation on Eliquis, stage III colon cancer status post colectomy with colostomy in place,  Just s/p repair of incarcerated hernia 06/05/18 and post-op NSTEMI, d/c home on 06/11/18.  Returned due to weakness, A-fib w/ RVR, AKI.  Clinical Impression  Patient presents with decreased independence with mobility due to generalized weakness with HR up to 135 with ambulation and difficulty with managing ADL/IADL's at home since d/c.  Feel she will benefit from skilled PT in the acute setting to allow return home to independence following STSNF level rehab stay.  Patient interested in looking into [facility].     Follow Up Recommendations SNF;Supervision for mobility/OOB    Equipment Recommendations  None recommended by PT    Recommendations for Other Services       Precautions / Restrictions Precautions Precautions: Fall Precaution Comments: Watch HR (tachycardic) Restrictions Weight Bearing Restrictions: No      Mobility  Bed Mobility   Bed Mobility: Supine to Sit     Supine to sit: Min guard;HOB elevated     General bed mobility comments: cues for technique due to back pain with railing  Transfers   Equipment used: Rolling walker (2 wheeled) Transfers: Sit to/from Stand Sit to Stand: Min guard         General transfer comment: assist for safety  Ambulation/Gait Ambulation/Gait assistance: Min guard Gait Distance (Feet): 120 Feet Assistive device: Rolling walker (2 wheeled) Gait Pattern/deviations: Step-through pattern;Decreased stride length     General Gait Details: assist for balance, HR max 135, SpO2 92% on RA, dyspnea 2/4  Stairs            Wheelchair Mobility     Modified Rankin (Stroke Patients Only)       Balance Overall balance assessment: Needs assistance Sitting-balance support: Feet supported;No upper extremity supported Sitting balance-Leahy Scale: Good     Standing balance support: No upper extremity supported Standing balance-Leahy Scale: Fair Standing balance comment: able to walk short distance in room no device with minguard A                             Pertinent Vitals/Pain Pain Score: 7  Pain Location: back pain Pain Descriptors / Indicators: Tightness;Sore Pain Intervention(s): Monitored during session;Repositioned    Home Living Family/patient expects to be discharged to:: Private residence Living Arrangements: Alone Available Help at Discharge: Family;Friend(s);Available PRN/intermittently Type of Home: Apartment Home Access: Level entry     Home Layout: One level Home Equipment: Walker - 2 wheels;Walker - 4 wheels;Cane - single point;Bedside commode;Shower seat      Prior Function Level of Independence: Independent         Comments: difficulty managing at home alone since surgery, but no falls.     Hand Dominance   Dominant Hand: Right    Extremity/Trunk Assessment   Upper Extremity Assessment Upper Extremity Assessment: Generalized weakness    Lower Extremity Assessment Lower Extremity Assessment: Generalized weakness    Cervical / Trunk Assessment Cervical / Trunk Assessment: Kyphotic  Communication   Communication: No difficulties  Cognition Arousal/Alertness: Awake/alert Behavior During Therapy: WFL for tasks assessed/performed Overall Cognitive Status: Within Functional Limits for tasks assessed  General Comments      Exercises     Assessment/Plan    PT Assessment Patient needs continued PT services  PT Problem List Decreased balance;Decreased activity tolerance;Decreased strength;Cardiopulmonary status  limiting activity       PT Treatment Interventions DME instruction;Gait training;Functional mobility training;Therapeutic activities;Therapeutic exercise;Patient/family education;Balance training    PT Goals (Current goals can be found in the Care Plan section)  Acute Rehab PT Goals Patient Stated Goal: reutrn to independent PT Goal Formulation: With patient Time For Goal Achievement: 06/29/18 Potential to Achieve Goals: Good    Frequency Min 3X/week   Barriers to discharge        Co-evaluation               AM-PAC PT "6 Clicks" Mobility  Outcome Measure Help needed turning from your back to your side while in a flat bed without using bedrails?: A Little Help needed moving from lying on your back to sitting on the side of a flat bed without using bedrails?: A Little Help needed moving to and from a bed to a chair (including a wheelchair)?: A Little Help needed standing up from a chair using your arms (e.g., wheelchair or bedside chair)?: A Little Help needed to walk in hospital room?: A Little Help needed climbing 3-5 steps with a railing? : A Little 6 Click Score: 18    End of Session Equipment Utilized During Treatment: Gait belt Activity Tolerance: Patient limited by fatigue Patient left: in chair;with chair alarm set;with call bell/phone within reach   PT Visit Diagnosis: Difficulty in walking, not elsewhere classified (R26.2);Muscle weakness (generalized) (M62.81)    Time: 2197-5883 PT Time Calculation (min) (ACUTE ONLY): 34 min   Charges:   PT Evaluation $PT Eval Moderate Complexity: 1 Mod PT Treatments $Gait Training: 8-22 mins        Magda Kiel, [place] [facility] [phone] 06/15/2018   Reginia Naas 06/15/2018, 1:04 PM

## 2018-06-15 NOTE — Discharge Instructions (Addendum)
Chemical Cardioversion  Chemical cardioversion, also called pharmacologic cardioversion, is the use of medicine to make an abnormal heart rhythm normal again. You may have this treatment if you have a new abnormal heart rhythm such as atrial fibrillation, or if your abnormal heart rhythm is causing problems such as shortness of breath. If this treatment is not successful, you may need to have a different kind of cardioversion called electrical cardioversion. Electrical cardioversion is the delivery of a jolt of electricity to restore a normal rhythm to the heart. Tell a health care provider about:  Any allergies you have.  All medicines you are taking, including vitamins, herbs, eye drops, creams, and over-the-counter medicines.  Any problems you or family members have had with anesthetic medicines.  Any blood disorders you have.  Any surgeries you have had.  Any medical conditions you have.  Whether you are pregnant or may be pregnant. What are the risks? Generally, this is a safe procedure. However, problems may occur, including:  Worsening of your abnormal heart rhythm.  An abnormal heart rhythm that is life-threatening.  A stroke from a blood clot. What happens before the procedure?  Take over-the-counter and prescription medicines only as told by your health care provider. Your health care provider may have you start taking blood-thinning medicines (anticoagulants) so your blood does not clot easily. Your health care provider may also give you medicines to help stabilize your heart rhythm.  Ask your health care provider about changing or stopping your regular medicines. This is especially important if you are taking diabetes medicines or blood thinners.  You may have a test to look for blood clots in your heart (transesophageal echocardiogram, or TEE). In this test, a tube with an instrument is passed down the esophagus. Sound waves (ultrasound) are then used to produce very  clear, detailed images of the heart. What happens during the procedure?  An IV may be inserted into one of your veins.  You may be given medicine to help you relax (sedative).  You will be given one or more medicines by mouth or through an IV. The number of medicines will depend on your heart rhythm. You may take the medicines while you are at home, in a clinic, or in the hospital.  Sticky patches (electrodes) or metal paddles may be placed on your chest to monitor your heart. The procedure may vary among health care providers and hospitals. What happens after the procedure?  You may be asked to stay in the hospital so your heart rhythm can be monitored.  Do not drive for 24 hours if you were given a sedative during your procedure. Summary  Chemical cardioversion, also called pharmacologic cardioversion, is the use of medicine to make an abnormal heart rhythm normal again.  You will be given one or more medicines by mouth or through an IV tube in your vein.  You may be asked to stay in the hospital so your heart rhythm can be monitored. This information is not intended to replace advice given to you by your health care provider. Make sure you discuss any questions you have with your health care provider. Document Released: 01/07/2006 Document Revised: 11/12/2016 Document Reviewed: 03/22/2016 Elsevier Interactive Patient Education  2019 [place] on my medicine - ELIQUIS (apixaban)  Why was Eliquis prescribed for you? Eliquis was prescribed for you to reduce the risk of a blood clot forming that can cause a stroke if you have a medical condition called atrial fibrillation (a type  of irregular heartbeat).  What do You need to know about Eliquis ? Take your Eliquis TWICE DAILY - one tablet in the morning and one tablet in the evening with or without food. If you have difficulty swallowing the tablet whole please discuss with your pharmacist how to take the  medication safely.  Take Eliquis exactly as prescribed by your doctor and DO NOT stop taking Eliquis without talking to the doctor who prescribed the medication.  Stopping may increase your risk of developing a stroke.  Refill your prescription before you run out.  After discharge, you should have regular check-up appointments with your healthcare provider that is prescribing your Eliquis.  In the future your dose may need to be changed if your kidney function or weight changes by a significant amount or as you get older.  What do you do if you miss a dose? If you miss a dose, take it as soon as you remember on the same day and resume taking twice daily.  Do not take more than one dose of ELIQUIS at the same time to make up a missed dose.  Important Safety Information A possible side effect of Eliquis is bleeding. You should call your healthcare provider right away if you experience any of the following: ? Bleeding from an injury or your nose that does not stop. ? Unusual colored urine (red or dark brown) or unusual colored stools (red or black). ? Unusual bruising for unknown reasons. ? A serious fall or if you hit your head (even if there is no bleeding).  Some medicines may interact with Eliquis and might increase your risk of bleeding or clotting while on Eliquis. To help avoid this, consult your healthcare provider or pharmacist prior to using any new prescription or non-prescription medications, including herbals, vitamins, non-steroidal anti-inflammatory drugs (NSAIDs) and supplements.  This website has more information on Eliquis (apixaban): http://www.eliquis.com/eliquis/home

## 2018-06-15 NOTE — Progress Notes (Signed)
Received consult - recently discharge home, possibly need rehab vs [place]; awaiting for Physical Therapy eval for disposition needs; Aneta Mins [phone]

## 2018-06-16 ENCOUNTER — Encounter (HOSPITAL_COMMUNITY): Payer: Self-pay | Admitting: Certified Registered Nurse Anesthetist

## 2018-06-16 ENCOUNTER — Encounter (HOSPITAL_COMMUNITY)
Admission: EM | Disposition: A | Discharge status: Home Health | Payer: Self-pay | Source: Home / Self Care | Attending: Internal Medicine

## 2018-06-16 LAB — CBC
HEMATOCRIT: 27.7 % — AB (ref 36.0–46.0)
Hemoglobin: 8.6 g/dL — ABNORMAL LOW (ref 12.0–15.0)
MCH: 26.4 pg (ref 26.0–34.0)
MCHC: 31 g/dL (ref 30.0–36.0)
MCV: 85 fL (ref 80.0–100.0)
Platelets: 361 10*3/uL (ref 150–400)
RBC: 3.26 MIL/uL — ABNORMAL LOW (ref 3.87–5.11)
RDW: 16 % — AB (ref 11.5–15.5)
WBC: 8.7 10*3/uL (ref 4.0–10.5)
nRBC: 0 % (ref 0.0–0.2)

## 2018-06-16 LAB — BASIC METABOLIC PANEL
Anion gap: 8 (ref 5–15)
BUN: 10 mg/dL (ref 8–23)
CO2: 21 mmol/L — ABNORMAL LOW (ref 22–32)
Calcium: 8.2 mg/dL — ABNORMAL LOW (ref 8.9–10.3)
Chloride: 109 mmol/L (ref 98–111)
Creatinine, Ser: 1.35 mg/dL — ABNORMAL HIGH (ref 0.44–1.00)
GFR calc Af Amer: 43 mL/min — ABNORMAL LOW (ref >60)
GFR calc non Af Amer: 37 mL/min — ABNORMAL LOW (ref >60)
Glucose, Bld: 113 mg/dL — ABNORMAL HIGH (ref 70–99)
Potassium: 4.2 mmol/L (ref 3.5–5.1)
Sodium: 138 mmol/L (ref 135–145)

## 2018-06-16 SURGERY — CANCELLED PROCEDURE

## 2018-06-16 MED ORDER — AMIODARONE HCL 200 MG PO TABS
400.0000 mg | ORAL_TABLET | Freq: Every day | ORAL | Status: DC
  Administered 2018-06-17: 400 mg via ORAL
  Filled 2018-06-16: qty 2

## 2018-06-16 NOTE — Progress Notes (Addendum)
Progress Note  Patient Name: [name] Date of Encounter: 06/16/2018  Primary Cardiologist: Kate Sable, MD   Subjective   Pt began feeling much better immediately after conversion from afib to sinus rhythm.   Inpatient Medications    Scheduled Meds:  amiodarone  400 mg Oral BID   apixaban  5 mg Oral BID   citalopram  40 mg Oral Daily   ferrous sulfate  325 mg Oral Q breakfast   fluticasone  2 spray Each Nare Daily   hydrALAZINE  50 mg Oral Q8H   isosorbide mononitrate  30 mg Oral q morning - 10a   levothyroxine  50 mcg Oral QAC breakfast   loratadine  10 mg Oral QHS   losartan  50 mg Oral Daily   metoprolol succinate  12.5 mg Oral Daily   montelukast  10 mg Oral QHS   pantoprazole  40 mg Oral Daily   polyethylene glycol  17 g Oral Daily   potassium chloride  10 mEq Oral Daily   rosuvastatin  10 mg Oral Daily   traZODone  100 mg Oral QHS   Vitamin D (Ergocalciferol)  50,000 Units Oral Q Tue   Continuous Infusions:  PRN Meds: albuterol, ALPRAZolam, furosemide, meclizine, nitroGLYCERIN, nystatin, ondansetron **OR** ondansetron (ZOFRAN) IV, ondansetron, polyvinyl alcohol, traMADol, zolpidem   Vital Signs    Vitals:   06/16/18 0030 06/16/18 0109 06/16/18 0415 06/16/18 1012  BP:  140/60 (!) 145/55 (!) 130/57  Pulse: 79 74 83 79  Resp:  18 18   Temp:  98.6 F (37 C) 99 F (37.2 C)   TempSrc:  Oral Oral   SpO2:  94% 94% 95%  Weight:   72.7 kg   Height:        Intake/Output Summary (Last 24 hours) at 06/16/2018 1152 Last data filed at 06/16/2018 0400 Gross per 24 hour  Intake 462 ml  Output 200 ml  Net 262 ml   Last 3 Weights 06/16/2018 06/15/2018 06/14/2018  Weight (lbs) 160 lb 3.2 oz 159 lb 9.6 oz 158 lb 8 oz  Weight (kg) 72.666 kg 72.394 kg 71.895 kg      Telemetry    Sinus rhythm in the 70's with occ PVC - Personally Reviewed  ECG    Sinus rhythm with occasional Premature ventricular complexes and Premature atrial complexes,  non-specific T changes inferiorly- Personally Reviewed  Physical Exam   GEN: Elderly female, No acute distress.   Neck: No JVD Cardiac: RRR, no murmurs, rubs, or gallops.  Respiratory: Clear to auscultation bilaterally. GI: Soft, nontender, non-distended  MS: No edema; No deformity. Neuro:  Nonfocal  Psych: Normal affect   Labs    Chemistry Recent Labs  Lab 06/14/18 1328 06/15/18 0543 06/16/18 0401  NA 137 138 138  K 3.5 3.3* 4.2  CL 108 108 109  CO2 23 22 21*  GLUCOSE 110* 114* 113*  BUN 15 12 10   CREATININE 1.60* 1.44* 1.35*  CALCIUM 7.9* 8.0* 8.2*  PROT 5.7* 5.3*  --   ALBUMIN 2.3* 2.3*  --   AST 15 16  --   ALT 21 17  --   ALKPHOS 58 59  --   BILITOT 0.7 0.4  --   GFRNONAA 30* 34* 37*  GFRAA 35* 39* 43*  ANIONGAP 6 8 8      Hematology Recent Labs  Lab 06/14/18 1328 06/15/18 0543 06/16/18 0401  WBC 9.5 7.6 8.7  RBC 3.54* 3.31* 3.26*  HGB 9.1* 8.8* 8.6*  HCT 30.7* 28.3* 27.7*  MCV 86.7 85.5 85.0  MCH 25.7* 26.6 26.4  MCHC 29.6* 31.1 31.0  RDW 16.4* 16.2* 16.0*  PLT 332 329 361    Cardiac EnzymesNo results for input(s): TROPONINI in the last 168 hours.  Recent Labs  Lab 06/14/18 1340  TROPIPOC 1.16*     BNP Recent Labs  Lab 06/14/18 1328  BNP 570.9*     DDimer No results for input(s): DDIMER in the last 168 hours.   Radiology    Dg Chest 2 View  Result Date: 06/14/2018 CLINICAL DATA:  Shortness of breath. EXAM: CHEST - 2 VIEW COMPARISON:  06/06/2018 FINDINGS: The heart is enlarged but stable. There is a moderate-sized hiatal hernia. The mediastinal and hilar contours are within normal limits and stable. Stable eventration of the left hemidiaphragm with mild overlying vascular crowding and streaky atelectasis. There are small bilateral pleural effusions but no pulmonary edema or definite infiltrates. IMPRESSION: Small bilateral pleural effusions without pulmonary edema or infiltrates. Electronically Signed   By: Marijo Sanes M.D.   On:  06/14/2018 14:03    Cardiac Studies   Echocardiogram 06/07/2018 IMPRESSIONS   1. The left ventricle has normal systolic function with an ejection fraction of 60-65%. The cavity size was normal. Left ventricular diastolic Doppler parameters are indeterminate in the setting of atrial fibrillation. No obvious wall motion  abnormalities.  2. The right ventricle has normal systolic function. The cavity was normal. There is no increase in right ventricular wall thickness. Right ventricular systolic pressure upper normal with an estimated pressure of 34.6 mmHg.  3. Left atrial size was moderately dilated.  4. The aortic valve is tricuspid. Mild calcification of the aortic valve. Moderate aortic annular calcification noted.  5. The mitral valve is normal in structure. Mitral valve regurgitation is mild to moderate by color flow Doppler.  6. The tricuspid valve is normal in structure.  7. The aortic root is normal in size and structure.   Patient Profile     84 y.o. female with a hx of significant for HFpEF, A. Fib recently started on Eliquis, CAD s/p PCI-LAD 05/2011 and colon cancer s/p colostomy who was recently admitted on 06/05/18 with an incarcerated hernia and required extensive surgery (laparoscopic fundoplication). Cardiology was consulted on 06/06/18 for post operative atrial fibrillation and pt was started on amiodarone and Eliquis w/ plans to return back for outpatient DCCV. Pt discharged on 3/12 and has now been readmitted for SOB and weakness. She is being seen for the evaluation of persistent atrial fibrillation  Assessment & Plan    Persistent atrial fibrillation -On amiodarone, metoprolol and Eliquis.  -Pt readmitted with Exertional dyspnea likely multifactorial 2/2 atrial fibrillation, anemia and diastolic CHF. -Initially in afib with rates ~110. Cardioversion was planned for today however pt converted to sinus rhythm at about 20:15 last night. She continues in sinus rhythm in the 70's.    -continue current therapy and watch rhythm. Important to maintain uninterrupted anticoagulation for the next 4 weeks after conversion from afib to sinus rhythm .  -We will plan to have pt follow up in 3 months but will arrange a follow up phone call with the afib clinic in a week to check on her status.   HRpEF -Echo on 3/8 showed normal LVEF.  -BNP on presentation was 570. Pt with exertional dyspnea, now much improved with attainment of sinus rhythm. Pt appears euvolemic.   AKI -SCr 1.60>1.44>1.35  Anemia -Hgb stable at 8.6.    [facility] will sign off.   Medication Recommendations:  Drop Amiodarone dose to 400 mg daily for one more week (until 06/24/2018), then 200 mg daily until follow up visit.. Do not interrupt anticoagulation for the next 4 weeks.  Other recommendations (labs, testing, etc):  none Follow up as an outpatient:  Will have afib clinic call pt in 1 week to check on her status. Will arrange office f/u in 3 months.   For questions or updates, please contact [facility] Please consult [email or web address] for contact info under        Signed, Daune Perch, NP  06/16/2018, 11:52 AM     I have seen and examined the patient along with Daune Perch, NP .  I have reviewed the chart, notes and new data.  I agree with PA/NP's note.  Key new complaints: marked improvement after conversion to NSR last night 2000h Key examination changes: RRR Key new findings / data: NSR on monitor  PLAN: Drop Amiodarone dose to 400 mg daily for one more week (until 06/24/2018), then 200 mg daily until follow up visit.  Sanda Klein, MD, [place] [phone] 06/16/2018, 12:41 PM

## 2018-06-16 NOTE — Progress Notes (Signed)
PROGRESS NOTE  [name] ZGY:[id number] DOB: 1934/07/05 DOA: 06/14/2018 PCP: Terald Sleeper, PA-C  HPI/Recap of past 24 hours: [name] is a 84 y.o. female with medical history significant of incarcerated hiatal hernia with recent laparoscopic fundoplication, atrial fibrillation on Eliquis, stage III colon cancer status post colectomy with colostomy in place, history of hiatal hernia that was initially repaired on March 6, anemia of chronic disease, anxiety disorder who was recently discharged on March 12.  Patient postoperative period  Complicated with non-ST elevation MI with very high troponins.  She went home postoperatively and was doing better until the last few days.  Patient has been unable to walk even to the bathroom.  She has been so weak and debilitated.  She lives alone.  Denied any fever or chills.  She denied any nausea or vomiting.  She has colostomy with normal colored stool.  No dysuria.  Patient could not even get to the kitchen.  She lives by herself so she came to the ER by EMS where she was seen and evaluated.  Her surgical site appears normal no evidence of infection.  Patient still in atrial fibrillation with a rate controlled.  She is on Eliquis with plan for possible cardioversion in the next few weeks.  Cardiology has been called and recommended admission and they will follow.  Patient has been admitted to the medical service with significant weakness.   06/16/18: Seen and examined at bedside.  No acute events overnight.  She has no new complaints.  She denies any chest pain, dyspnea or palpitations.  Plan for TEE directed cardioversion.   Assessment/Plan: Principal Problem:   Weakness generalized Active Problems:   Essential hypertension   Atrial fibrillation with RVR (HCC)   Dehydration   Acquired hypothyroidism   S/P robotic partial posterior (Toupet) fundoplication 08/07/1636   Pulmonary hypertension (HCC)   Anxiety state   Non-STEMI (non-ST elevated myocardial  infarction) (HCC)   Current use of long term anticoagulation   Renal failure (ARF), acute on chronic (HCC)  Resolving generalized weakness suspect multifactorial secondary to A. fib with RVR versus AKI versus others Presented with generalized weakness, A. fib with RVR, and AKI Rate is better controlled today Also presented with bilateral pleural effusion Independently reviewed chest x-[place] done on admission which revealed cardiomegaly with mild increase in pulmonary vascularity and bilateral pleural effusion. PT OT to assess Fall precautions  Chronic A. fib with RVR Plan for TEE directed cardioversion possibly today 06/16/2018 Continue amiodarone 400 mg twice daily, continue Toprol-XL 12.5 mg daily Continue Eliquis 5 mg twice daily for CVA prophylaxis  Bilateral pleural effusion suspect cardiogenic Stop IV fluid Strict I's and O's and daily weight May consider diuretics if O2 saturation does not improve Maintain O2 sat greater than 92%  Improving AKI on CKD 3 Baseline creatinine appears to be 1.20 with GFR 42 Presented with creatinine of 1.60 with GFR of 30 Creatinine is trending down to 1.35 on 04/17/2018 from 1.44 on 06/15/2018 Stop IV fluid due to bilateral pleural effusion to avoid pulmonary edema Avoid nephrotoxic agents/hypotension Repeat BMP in the morning  Resolved hypokalemia post repletion   Moderate protein calorie malnutrition Albumin 2.3 BMI 28 Encourage increasing protein calorie intake  Chronic normocytic anemia Baseline creatinine hemoglobin appears to be 10 Presented with hemoglobin of 9.1 trending down to 8.8 No sign of overt bleeding Negative FOBT MCV 85 Continue to monitor repeat CBC in the morning  Recent NSTEMI  Troponin trended down Denies  chest pain Continue cardiac medications Continue Crestor  Chronic diastolic CHF Continue Imdur, Toprol, losartan Last 2D echo done on 06/07/2018 revealed LVEF 60 to 65%  Essential hypertension: Blood pressure  appears controlled.  Continue close monitoring.  Recent abdominal surgery: Patient is stable.  She is on pured diet.  No evidence of any abnormalities.  Colostomy bag also appears stable.    DVT prophylaxis: Eliquis Code Status: Full code Family Communication: No family at bedside Disposition Plan:  Home when cardiology signs off possibly tomorrow 06/17/2018 Consults called: Cardiology     Objective: Vitals:   06/16/18 0030 06/16/18 0109 06/16/18 0415 06/16/18 1012  BP:  140/60 (!) 145/55 (!) 130/57  Pulse: 79 74 83 79  Resp:  18 18   Temp:  98.6 F (37 C) 99 F (37.2 C)   TempSrc:  Oral Oral   SpO2:  94% 94% 95%  Weight:   72.7 kg   Height:        Intake/Output Summary (Last 24 hours) at 06/16/2018 1143 Last data filed at 06/16/2018 0400 Gross per 24 hour  Intake 462 ml  Output 200 ml  Net 262 ml   Filed Weights   06/14/18 2108 06/15/18 0518 06/16/18 0415  Weight: 71.9 kg 72.4 kg 72.7 kg    Exam:  . General: 84 y.o. year-old female well-developed well-nourished in no acute distress.  Alert and oriented x3.   . Cardiovascular: Irregular rate and rhythm with no rubs or gallops.  No JVD or thyromegaly. Marland Kitchen Respiratory: Clear to auscultation with no wheezes or rales.  Good inspiratory effort. . Abdomen: Soft nontender nondistended with normal bowel sounds x4 quadrants. . Musculoskeletal: No lower extremity edema. 2/4 pulses in all 4 extremities. Marland Kitchen Psychiatry: Mood is appropriate for condition and setting   Data Reviewed: CBC: Recent Labs  Lab 06/14/18 1328 06/15/18 0543 06/16/18 0401  WBC 9.5 7.6 8.7  NEUTROABS 7.8*  --   --   HGB 9.1* 8.8* 8.6*  HCT 30.7* 28.3* 27.7*  MCV 86.7 85.5 85.0  PLT 332 329 213   Basic Metabolic Panel: Recent Labs  Lab 06/14/18 1328 06/15/18 0543 06/16/18 0401  NA 137 138 138  K 3.5 3.3* 4.2  CL 108 108 109  CO2 23 22 21*  GLUCOSE 110* 114* 113*  BUN 15 12 10   CREATININE 1.60* 1.44* 1.35*  CALCIUM 7.9* 8.0* 8.2*   MG 2.0  --   --    GFR: Estimated Creatinine Clearance: 30.9 mL/min (A) (by C-G formula based on SCr of 1.35 mg/dL (H)). Liver Function Tests: Recent Labs  Lab 06/14/18 1328 06/15/18 0543  AST 15 16  ALT 21 17  ALKPHOS 58 59  BILITOT 0.7 0.4  PROT 5.7* 5.3*  ALBUMIN 2.3* 2.3*   No results for input(s): LIPASE, AMYLASE in the last 168 hours. No results for input(s): AMMONIA in the last 168 hours. Coagulation Profile: No results for input(s): INR, PROTIME in the last 168 hours. Cardiac Enzymes: No results for input(s): CKTOTAL, CKMB, CKMBINDEX, TROPONINI in the last 168 hours. BNP (last 3 results) No results for input(s): PROBNP in the last 8760 hours. HbA1C: No results for input(s): HGBA1C in the last 72 hours. CBG: No results for input(s): GLUCAP in the last 168 hours. Lipid Profile: No results for input(s): CHOL, HDL, LDLCALC, TRIG, CHOLHDL, LDLDIRECT in the last 72 hours. Thyroid Function Tests: No results for input(s): TSH, T4TOTAL, FREET4, T3FREE, THYROIDAB in the last 72 hours. Anemia Panel: No results for input(s):  VITAMINB12, FOLATE, FERRITIN, TIBC, IRON, RETICCTPCT in the last 72 hours. Urine analysis:    Component Value Date/Time   COLORURINE AMBER (A) 06/14/2018 1328   APPEARANCEUR HAZY (A) 06/14/2018 1328   APPEARANCEUR Clear 05/13/2016 0856   LABSPEC 1.028 06/14/2018 1328   PHURINE 5.0 06/14/2018 1328   GLUCOSEU NEGATIVE 06/14/2018 1328   HGBUR NEGATIVE 06/14/2018 1328   [place] 06/14/2018 1328   BILIRUBINUR Negative 05/13/2016 0856   [place] 06/14/2018 1328   PROTEINUR 30 (A) 06/14/2018 1328   UROBILINOGEN 0.2 02/10/2015 1936   NITRITE NEGATIVE 06/14/2018 1328   LEUKOCYTESUR NEGATIVE 06/14/2018 1328   Sepsis Labs: @LABRCNTIP (procalcitonin:4,lacticidven:4)  ) Recent Results (from the past 240 hour(s))  MRSA PCR Screening     Status: None   Collection Time: 06/07/18  4:06 AM  Result Value Ref Range Status   MRSA by PCR  NEGATIVE NEGATIVE Final    Comment:        The GeneXpert MRSA Assay (FDA approved for NASAL specimens only), is one component of a comprehensive MRSA colonization surveillance program. It is not intended to diagnose MRSA infection nor to guide or monitor treatment for MRSA infections. Performed at [facility], [place] [address] [place], [place] [address]       Studies: No results found.  Scheduled Meds: . amiodarone  400 mg Oral BID  . apixaban  5 mg Oral BID  . citalopram  40 mg Oral Daily  . ferrous sulfate  325 mg Oral Q breakfast  . fluticasone  2 spray Each Nare Daily  . hydrALAZINE  50 mg Oral Q8H  . isosorbide mononitrate  30 mg Oral q morning - 10a  . levothyroxine  50 mcg Oral QAC breakfast  . loratadine  10 mg Oral QHS  . losartan  50 mg Oral Daily  . metoprolol succinate  12.5 mg Oral Daily  . montelukast  10 mg Oral QHS  . pantoprazole  40 mg Oral Daily  . polyethylene glycol  17 g Oral Daily  . potassium chloride  10 mEq Oral Daily  . rosuvastatin  10 mg Oral Daily  . traZODone  100 mg Oral QHS  . Vitamin D (Ergocalciferol)  50,000 Units Oral Q Tue    Continuous Infusions:    LOS: 2 days     Kayleen Memos, MD [facility] Pager [phone]  If 7PM-7AM, please contact night-coverage [email or web address] Password [facility] 06/16/2018, 11:43 AM

## 2018-06-16 NOTE — TOC Initial Note (Signed)
Transition of Care [facility]) - Initial/Assessment Note    Patient Details  Name: [name] MRN: [id number] Date of Birth: 1934/10/17  [facility] CM/SW Contact:    Sherrilyn Rist Phone Number: [phone] 06/16/2018, 12:02 PM  Clinical Narrative:                 CM talked to patient at the bedside; she lives alone, supportive daughters; PCP: Terald Sleeper, PA-C; has private insurance with [facility] with prescription drug coverage; pharmacy of choice is [facility] in [place]; patient reports no problem getting her medication; she states that she goes to the gym and works out 3 times a week ( prior to last hospitalization) and plans to return when she is stronger; no DME in the home; patient is refusing SNF placement at this time and plans to return home at discharge; [facility] choice offered, pt chose [facility]; [place] with [facility] called for arrangements; No other needs identified at this time; CM will continue to follow for progression of care.  Expected Discharge Plan: [place] Barriers to Discharge: No Barriers Identified   Patient Goals and CMS Choice Patient states their goals for this hospitalization and ongoing recovery are:: To get stronger CMS Medicare.gov Compare Post Acute Care list provided to:: Patient Choice offered to / list presented to : Patient  Expected Discharge Plan and Services Expected Discharge Plan: [place] Discharge Planning Services: CM Consult   Living arrangements for the past 2 months: Single Family Home Expected Discharge Date: 06/18/18                   HH Arranged: PT HH Agency: [facility] [employer]  Prior Living Arrangements/Services Living arrangements for the past 2 months: [place] with:: Self Patient language and need for interpreter reviewed:: No Do you feel safe going back to the place where you live?: Yes      Need  for Family Participation in Patient Care: No (Comment) Care giver support system in place?: Yes (comment)   Criminal Activity/Legal Involvement Pertinent to Current Situation/Hospitalization: No - Comment as needed  Activities of Daily Living Home Assistive Devices/Equipment: Cane (specify quad or straight), Walker (specify type) ADL Screening (condition at time of admission) Patient's cognitive ability adequate to safely complete daily activities?: Yes Is the patient deaf or have difficulty hearing?: No Does the patient have difficulty seeing, even when wearing glasses/contacts?: No Does the patient have difficulty concentrating, remembering, or making decisions?: No Patient able to express need for assistance with ADLs?: Yes Does the patient have difficulty dressing or bathing?: No Independently performs ADLs?: Yes (appropriate for developmental age) Does the patient have difficulty walking or climbing stairs?: No Weakness of Legs: None Weakness of Arms/Hands: None  Permission Sought/Granted Permission sought to share information with : [place] Manager Permission granted to share information with : Yes, Verbal Permission Granted  Share Information with NAME: Colleen Can (Daughter  Permission granted to share info w AGENCY: [employer] agency        Emotional Assessment Appearance:: Appears younger than stated age Attitude/Demeanor/Rapport: Gracious Affect (typically observed): Accepting, Calm Orientation: : Oriented to Self, Oriented to Place, Oriented to  Time, Oriented to Situation Alcohol / Substance Use: Not Applicable Psych Involvement: No (comment)  Admission diagnosis:  Generalized weakness [R53.1] AKI (acute kidney injury) ([place]) [N17.9] Anemia, unspecified type [D64.9] Atrial fibrillation, unspecified type [facility]) [I48.91] Patient  Active Problem List   Diagnosis Date Noted  . Weakness generalized 06/14/2018  . Renal failure (ARF), acute on chronic ([place]) 06/14/2018  .  Non-STEMI (non-ST elevated myocardial infarction) ([place]) 06/11/2018  . Current use of long term anticoagulation 06/11/2018  . Incarcerated recurrent hiatal hernia s/p robotic repair 06/05/2018 06/05/2018  . GERD (gastroesophageal reflux disease) s/p Toupet fundoplication (partial posterior wrap) 06/05/2018  . S/P robotic partial posterior (Toupet) fundoplication 04/06/1094 04/54/0981  . Pulmonary hypertension ([place])   . Hypothyroidism   . History of [name]'s esophagus   . Anxiety state   . Acquired hypothyroidism 01/30/2018  . Cancer of right colon ([place]) 05/20/2017  . Irritable bowel syndrome with constipation 10/15/2016  . Iron deficiency 08/13/2016  . Discitis of lumbar region L5- S1 03/01/2016  . Chronic diastolic CHF (congestive heart failure) ([place]) 03/01/2016  . DDD (degenerative disc disease), lumbar 02/12/2016  . History of GI bleed 12/18/2015  . Dehydration 08/29/2015  . Gastric volvulus 08/29/2015  . Erosive esophagitis 08/29/2015  . Atrial fibrillation with RVR ([place]) 08/09/2015  . TIA (transient ischemic attack)   . Pressure ulcer 06/17/2015  . Essential hypertension   . Colonic mass 05/02/2015  . Anemia due to chronic blood loss 05/02/2015  . PAF (paroxysmal atrial fibrillation) ([place]) 05/02/2015  . Anxiety and depression 05/02/2015  . CKD (chronic kidney disease) stage 3, GFR 30-59 ml/min (HCC) 03/03/2015  . History of percutaneous coronary intervention   . Coronary artery disease   . HYPERCHOLESTEROLEMIA 07/16/2007  . Major depressive disorder, single episode, unspecified 07/16/2007   PCP:  Terald Sleeper, PA-C Pharmacy:   [place], [place] [address] Phone: [phone] Fax: [phone]     Social Determinants of Health (SDOH) Interventions    Readmission Risk Interventions 30 Day Unplanned Readmission Risk Score     ED to Hosp-Admission (Current) from 06/14/2018 in [place]  CHF  30 Day Unplanned Readmission Risk Score (%)  31 Filed at 06/16/2018 0801     This score is the patient's risk of an unplanned readmission within 30 days of being discharged (0 -100%). The score is based on dignosis, age, lab data, medications, orders, and past utilization.   Low:  0-14.9   Medium: 15-21.9   High: 22-29.9   Extreme: 30 and above       Readmission Risk Prevention Plan 06/15/2018  Transportation Screening Complete  Medication Review Press photographer) Complete  PCP or Specialist appointment within 3-5 days of discharge Complete  HRI or [place] Complete  SW Recovery Care/Counseling Consult Complete  Palliative Care Screening Not [place] Complete  Some recent data might be hidden

## 2018-06-16 NOTE — Progress Notes (Signed)
Noted patient to be in sinus rhythm with PVC on monitor.  Will continue to monitor closely

## 2018-06-17 DIAGNOSIS — I5033 Acute on chronic diastolic (congestive) heart failure: Secondary | ICD-10-CM

## 2018-06-17 DIAGNOSIS — Z79899 Other long term (current) drug therapy: Secondary | ICD-10-CM

## 2018-06-17 DIAGNOSIS — I10 Essential (primary) hypertension: Secondary | ICD-10-CM

## 2018-06-17 MED ORDER — AMIODARONE HCL 200 MG PO TABS: 400.0000 mg | ORAL_TABLET | Freq: Every day | ORAL | 0 refills | Status: DC

## 2018-06-17 NOTE — Progress Notes (Signed)
Physical Therapy Treatment Patient Details Name: [name] MRN: [id number] DOB: October 29, 1934 Today's Date: 06/17/2018    History of Present Illness Pt is an 84 y.o. female with recent d/c home (06/11/18) after admission for incarcerated hernia repair (3/6) with post-op NSTEMI, now admitted 06/14/18 with weakness; worked up for afib with RVR and AKI. S/p cardioversion 3/17. PMH includes afib on Eliquis, colon CA with colostomy.   PT Comments    Pt progressing well with mobility. Ambulatory with RW at [place]. Pt declining SNF-level therapies with plans to return home this afternoon. Will have appropriate support available upon return home; owns necessary DME. SpO2 >92% on RA with mobility; HR 129-150 while walking (cardiologist aware). If to remain admitted, will follow acutely.     Follow Up Recommendations  Home health PT;Supervision for mobility/OOB     Equipment Recommendations  None recommended by PT    Recommendations for Other Services       Precautions / Restrictions Precautions Precautions: Fall Precaution Comments: Watch HR (tachycardic) Restrictions Weight Bearing Restrictions: No    Mobility  Bed Mobility Overal bed mobility: Independent                Transfers Overall transfer level: Modified independent Equipment used: Rolling walker (2 wheeled) Transfers: Sit to/from Stand           General transfer comment: Requesting use of RW; mod indep  Ambulation/Gait Ambulation/Gait assistance: Supervision Gait Distance (Feet): 200 Feet Assistive device: Rolling walker (2 wheeled) Gait Pattern/deviations: Step-through pattern;Decreased stride length Gait velocity: Decreased Gait velocity interpretation: 1.31 - 2.62 ft/sec, indicative of limited community ambulator General Gait Details: Slow, steady gait with RW; supervisoin for safety. DOE 3/4 with SpO2 >92% on RA; HR 129-150 while walking, cued for rest breaks accordingly   Stairs             Wheelchair Mobility    Modified Rankin (Stroke Patients Only)       Balance Overall balance assessment: Needs assistance Sitting-balance support: Feet supported;No upper extremity supported Sitting balance-Leahy Scale: Good     Standing balance support: No upper extremity supported Standing balance-Leahy Scale: Fair                              Cognition Arousal/Alertness: Awake/alert Behavior During Therapy: WFL for tasks assessed/performed Overall Cognitive Status: Within Functional Limits for tasks assessed                                        Exercises      General Comments General comments (skin integrity, edema, etc.): Cardiologist present in hallway to notify of pt's tachycardia and abnormal rhythm      Pertinent Vitals/Pain Pain Assessment: No/denies pain    Home Living                      Prior Function            PT Goals (current goals can now be found in the care plan section) Acute Rehab PT Goals Patient Stated Goal: Go home today PT Goal Formulation: With patient Time For Goal Achievement: 06/29/18 Potential to Achieve Goals: Good Progress towards PT goals: Progressing toward goals    Frequency    Min 3X/week      PT Plan Discharge plan needs to be updated  Co-evaluation              AM-PAC PT "6 Clicks" Mobility   Outcome Measure  Help needed turning from your back to your side while in a flat bed without using bedrails?: None Help needed moving from lying on your back to sitting on the side of a flat bed without using bedrails?: None Help needed moving to and from a bed to a chair (including a wheelchair)?: None Help needed standing up from a chair using your arms (e.g., wheelchair or bedside chair)?: A Little Help needed to walk in hospital room?: A Little Help needed climbing 3-5 steps with a railing? : A Little 6 Click Score: 21    End of Session Equipment Utilized During  Treatment: Gait belt Activity Tolerance: Patient tolerated treatment well Patient left: in chair;with chair alarm set;with call bell/phone within reach Nurse Communication: Mobility status PT Visit Diagnosis: Difficulty in walking, not elsewhere classified (R26.2);Muscle weakness (generalized) (M62.81)     Time: 1194-1740 PT Time Calculation (min) (ACUTE ONLY): 15 min  Charges:  $Gait Training: 8-22 mins                    Mabeline Caras, PT, DPT Acute Rehabilitation Services  Pager [phone] Office [place] 06/17/2018, 10:45 AM

## 2018-06-17 NOTE — Discharge Summary (Signed)
Discharge Summary  [name] SFK:[id number] DOB: 1935-01-13  PCP: Terald Sleeper, PA-C  Admit date: 06/14/2018 Discharge date: 06/17/2018  Time spent: 35 minutes  Recommendations for Outpatient Follow-up:  1. Follow-up with cardiology 2. Follow-up with your PCP 3. Take your medications as prescribed 4. Continue physical therapy 5. Fall precautions  Discharge Diagnoses:  Active Hospital Problems   Diagnosis Date Noted   Weakness generalized 06/14/2018   Renal failure (ARF), acute on chronic (HCC) 06/14/2018   Current use of long term anticoagulation 06/11/2018   Non-STEMI (non-ST elevated myocardial infarction) ([place]) 06/11/2018   S/P robotic partial posterior ([place]et) fundoplication 04/07/4942 96/75/9163   Anxiety state    Pulmonary hypertension ([place])    Acquired hypothyroidism 01/30/2018   Dehydration 08/29/2015   Atrial fibrillation with RVR ([place]) 08/09/2015   Essential hypertension     Resolved Hospital Problems  No resolved problems to display.    Discharge Condition: Stable  Diet recommendation: Resume previous diet  Vitals:   06/17/18 0147 06/17/18 0615  BP:  (!) 141/98  Pulse:  75  Resp:    Temp:  97.8 F (36.6 C)  SpO2: 90% 91%    History of present illness:   Heather I Caseis a 84 y.o.femalewith medical history significant ofincarcerated hiatal hernia with recent laparoscopic fundoplication, atrial fibrillation on Eliquis, stage III colon cancer status post colectomy with colostomy in place, history of hiatal hernia that was initially repaired on March 6, anemia of chronic disease, anxiety disorder who was recently discharged on March 12. Patient postoperative periodComplicated with non-ST elevation MI with very high troponins. She went home postoperatively and was doing better until the last few days. Patient has been unable to walk even to the bathroom. She has been so weak and debilitated. She lives alone. Denied any fever or chills. She  denied any nausea or vomiting. She has colostomy with normal colored stool. No dysuria. Patient could not even get to the kitchen. She lives by herself so she came to the ER by EMS where she was seen and evaluated. Her surgical site appears normal no evidence of infection. Patient still in atrial fibrillation with a rate controlled. She is on Eliquis with plan for possible cardioversion in the next few weeks. Cardiology has been called and recommended admission and they will follow. Patient has been admitted to the medical service with significant weakness which is thought to be due to symptomatic A. fib with RVR.     Cardiology consulted and followed.  TEE directed cardioversion plan on 06/16/2018 canceled due to the patient converting back to sinus rhythm.  06/17/18: Patient seen and examined at her bedside.  She feels improved and states she feels great.  Telemetry this morning showed recent brief onset of recurrent paroxysmal A. fib.  She seems unaware of these brief events.  Cardiology saw her this morning and will continue to follow up outpatient.  PT assessed and recommended SNF.  Patient declined SNF and wants to go home.  Home health services arranged.  On the day of discharge, the patient was hemodynamically stable.  She will need to follow-up with cardiology and her PCP post hospitalization.  She will also need to continue physical therapy and apply fall precautions.    Hospital Course:  Principal Problem:   Weakness generalized Active Problems:   Essential hypertension   Atrial fibrillation with RVR (HCC)   Dehydration   Acquired hypothyroidism   S/P robotic partial posterior (T[place]et) fundoplication 11/02/6657   Pulmonary hypertension ([place])  Anxiety state   Non-STEMI (non-ST elevated myocardial infarction) (HCC)   Current use of long term anticoagulation   Renal failure (ARF), acute on chronic (HCC)  Resolving generalized weakness suspect multifactorial secondary to  paroxysmal A. fib with RVR versus AKI versus others Presented with generalized weakness, A. fib with RVR, and AKI Rate is better controlled PT assessed and recommended SNF, patient declined SNF. We will discharge with home health services Continue physical therapy Continue fall precautions Follow-up with your PCP  Paroxysmal A. fib with RVR Plan for TEE directed cardioversion canceled on 06/16/2018 due to conversion back to sinus rhythm Cardiology recommended amiodarone 400 mg daily x7 days then 200 mg daily afterwards Continue Toprol-XL 12.5 mg daily for rate control Continue Eliquis 5 mg twice daily for primary CVA prevention Follow-up with cardiology outpatient  Bilateral pleural effusion suspect cardiogenic from A. fib with RVR Continue p.o. Lasix as needed as recommended by cardiology Continue strict I's and O's and daily weight Maintain O2 saturation greater than 92% Follow-up with cardiology Home O2 evaluation prior to discharge  Improving AKI on CKD 3 Baseline creatinine appears to be 1.20 with GFR 42 Presented with creatinine of 1.60 with GFR of 30 Creatinine is trending down to 1.35 on 04/17/2018 from 1.44 on 06/15/2018 Continue to avoid nephrotoxic agents Follow-up with your PCP  Resolved hypokalemia post repletion   Moderate protein calorie malnutrition Albumin 2.3 BMI 28 Encourage increasing p.o. protein calorie intake  Chronic normocytic anemia Baseline creatinine hemoglobin appears to be 10 Hemoglobin 8.6 with MCV 85 on 06/16/2018 Follow-up with your PCP  Recent NSTEMI  Troponin trended down Denies chest pain Continue cardiac medications Continue Crestor  Chronic diastolic CHF Continue Imdur, Toprol, losartan, Lasix as needed Last 2D echo done on 06/07/2018 revealed LVEF 60 to 65%  Essential hypertension:Blood pressure normotensive.  Recent abdominal surgery: No acute issues DVT prophylaxis:Eliquis Code Status:Full code  Consults  called:Cardiology    Discharge Exam: BP (!) 141/98    Pulse 75    Temp 97.8 F (36.6 C) (Oral)    Resp 18    Ht 5' 2.5" (1.588 m)    Wt 72.8 kg    SpO2 91%    BMI 28.87 kg/m   General: 84 y.o. year-old female well developed well nourished in no acute distress.  Alert and oriented x3.  Cardiovascular: Regular rate and rhythm with no rubs or gallops.  No thyromegaly or JVD noted.    Respiratory: Clear to auscultation with no wheezes or rales. Good inspiratory effort.  Abdomen: Soft nontender nondistended with normal bowel sounds x4 quadrants.  Colostomy bag in place.  Musculoskeletal: No lower extremity edema. 2/4 pulses in all 4 extremities.  Psychiatry: Mood is appropriate for condition and setting  Discharge Instructions You were cared for by a hospitalist during your hospital stay. If you have any questions about your discharge medications or the care you received while you were in the hospital after you are discharged, you can call the unit and asked to speak with the hospitalist on call if the hospitalist that took care of you is not available. Once you are discharged, your primary care physician will handle any further medical issues. Please note that NO REFILLS for any discharge medications will be authorized once you are discharged, as it is imperative that you return to your primary care physician (or establish a relationship with a primary care physician if you do not have one) for your aftercare needs so that they can reassess your  need for medications and monitor your lab values.   Allergies as of 06/17/2018      Reactions   Sulfamethoxazole Rash      Medication List    STOP taking these medications   potassium chloride 10 MEQ tablet Commonly known as:  K-DUR   traZODone 100 MG tablet Commonly known as:  DESYREL   zolpidem 10 MG tablet Commonly known as:  AMBIEN     TAKE these medications   albuterol 108 (90 Base) MCG/ACT inhaler Commonly known as:   PROVENTIL HFA;VENTOLIN HFA Inhale 2 puffs into the lungs every 6 (six) hours as needed for wheezing or shortness of breath.   ALPRAZolam 0.25 MG tablet Commonly known as:  XANAX TAKEK 1/2 TO 1 TABLET 3 TIMES DAILY AS NEEDED What changed:    how much to take  how to take this  when to take this  reasons to take this  additional instructions   amiodarone 200 MG tablet Commonly known as:  PACERONE Take 2 tablets (400 mg total) by mouth daily. Please take 400 mg total by mouth daily for 7 days (end date 06/24/18) Then 200 mg total by mouth daily for remaining days What changed:    medication strength  when to take this  additional instructions   apixaban 5 MG Tabs tablet Commonly known as:  ELIQUIS Take 1 tablet (5 mg total) by mouth 2 (two) times daily.   citalopram 40 MG tablet Commonly known as:  CELEXA TAKE ONE (1) TABLET EACH DAY What changed:    how much to take  how to take this  when to take this  additional instructions   denosumab 60 MG/ML Sosy injection Commonly known as:  PROLIA Inject 60 mg into the skin every 6 (six) months.   esomeprazole 40 MG capsule Commonly known as:  NEXIUM TAKE ONE (1) CAPSULE EACH DAY What changed:    how much to take  how to take this  when to take this  additional instructions   ferrous sulfate 325 (65 FE) MG EC tablet Take 325 mg by mouth daily with breakfast.   fluticasone 50 MCG/ACT nasal spray Commonly known as:  FLONASE USE 2 SPRAYS IN EACH NOSTRIL DAILY   furosemide 40 MG tablet Commonly known as:  LASIX Take 40 mg by mouth as needed for fluid.   hydrALAZINE 50 MG tablet Commonly known as:  APRESOLINE TAKE ONE (1) TABLET THREE (3) TIMES EACH DAY What changed:    how much to take  how to take this  when to take this  additional instructions   isosorbide mononitrate 30 MG 24 hr tablet Commonly known as:  IMDUR Take 1 tablet (30 mg total) by mouth every morning.   levothyroxine 50 MCG  tablet Commonly known as:  SYNTHROID, LEVOTHROID TAKE ONE TABLET EACH MORNING BEFORE BREAKFAST What changed:    how much to take  how to take this  when to take this  additional instructions   loratadine 10 MG tablet Commonly known as:  CLARITIN Take 1 tablet (10 mg total) by mouth at bedtime.   losartan 100 MG tablet Commonly known as:  COZAAR TAKE ONE (1) TABLET EACH DAY What changed:    how much to take  how to take this  when to take this  additional instructions   Meclizine HCl 25 MG Chew TAKE 1 TABLET 3 TIMES DAILY AS NEEDED FOR DIZZINESS What changed:  See the new instructions.   metoprolol succinate 25 MG 24  hr tablet Commonly known as:  TOPROL-XL Take 0.5 tablets (12.5 mg total) by mouth daily.   montelukast 10 MG tablet Commonly known as:  SINGULAIR Take 1 tablet (10 mg total) by mouth at bedtime.   nitroGLYCERIN 0.4 MG SL tablet Commonly known as:  NITROSTAT Place 1 tablet (0.4 mg total) under the tongue every 5 (five) minutes as needed for chest pain. Reported on 04/26/2015   nystatin powder Commonly known as:  MYCOSTATIN/NYSTOP Apply topically 4 (four) times daily. What changed:    how much to take  when to take this  reasons to take this   ondansetron 4 MG disintegrating tablet Commonly known as:  ZOFRAN-ODT Take 1 tablet (4 mg total) by mouth every 6 (six) hours as needed for nausea or vomiting.   polyethylene glycol powder powder Commonly known as:  GaviLAX MIX 17 GRAMS IN 80Z OF WATER/JUICE AND DRINK TWICE DAILY What changed:    how much to take  how to take this  when to take this  additional instructions   rosuvastatin 10 MG tablet Commonly known as:  CRESTOR TAKE ONE (1) TABLET EACH DAY What changed:    how much to take  how to take this  when to take this  additional instructions   SYSTANE BALANCE OP Apply 1 drop to eye daily as needed (dry eyes).   traMADol 50 MG tablet Commonly known as:  ULTRAM Take 1  tablet (50 mg total) by mouth every 12 (twelve) hours as needed. What changed:    when to take this  reasons to take this   Vitamin D (Ergocalciferol) 1.25 MG (50000 UT) Caps capsule Commonly known as:  DRISDOL TAKE 1 CAPSULE ON TUESDAYS What changed:    how much to take  how to take this  when to take this  additional instructions      Allergies  Allergen Reactions   Sulfamethoxazole Rash   Follow-up Information    [facility] Follow up.   Specialty:  [place] Why:  They will do your home health care at your home Contact information: [place] [address] [phone]        [facility] Follow up.   Specialty:  Cardiology Why:  You be called on Tuesday at 11:30 to check on how you are doing. Please be prepared to answer the phone.  Contact information: [address] [id number] [place] [phone]       Herminio Commons, MD Follow up.   Specialty:  Cardiology Why:  Cardiology follow up 06/25/2018 at 9:00.  Contact information: [place] [address] [phone]        Terald Sleeper, PA-C. Call in 1 day(s).   Specialties:  Physician Assistant, Family Medicine Why:  Please call for a post hospital follow-up appointment. Contact information: [place] [address] [phone]        Herminio Commons, MD .   Specialty:  Cardiology Contact information: [place] [address] [phone]            The results of significant diagnostics from this hospitalization (including imaging, microbiology, ancillary and laboratory) are listed below for reference.    Significant Diagnostic Studies: Dg Chest 2 View  Result Date: 06/14/2018 CLINICAL DATA:  Shortness of breath. EXAM: CHEST - 2 VIEW COMPARISON:  06/06/2018 FINDINGS: The heart is enlarged but stable. There is  a moderate-sized  hiatal hernia. The mediastinal and hilar contours are within normal limits and stable. Stable eventration of the left hemidiaphragm with mild overlying vascular crowding and streaky atelectasis. There are small bilateral pleural effusions but no pulmonary edema or definite infiltrates. IMPRESSION: Small bilateral pleural effusions without pulmonary edema or infiltrates. Electronically Signed   By: Marijo Sanes M.D.   On: 06/14/2018 14:03   Dg Chest Port 1 View  Result Date: 06/07/2018 CLINICAL DATA:  Tachycardia. EXAM: PORTABLE CHEST 1 VIEW COMPARISON:  Chest CT 06/04/2016 FINDINGS: Post recent repair of hiatal hernia with surgical drain in place. The heart is enlarged. Elevation of left hemidiaphragm. Mild left greater than right basilar atelectasis. No confluent airspace disease. No pulmonary edema, large pleural effusion or pneumothorax. IMPRESSION: Cardiomegaly with left greater than right basilar atelectasis. Electronically Signed   By: Keith Rake M.D.   On: 06/07/2018 00:00   Dg Esophagus W Single Cm (sol Or Thin Ba)  Result Date: 06/06/2018 CLINICAL DATA:  84 year old female with hiatal hernia repair and fundoplication. EXAM: ESOPHOGRAM/BARIUM SWALLOW TECHNIQUE: Single contrast examination was performed using water-soluble contrast. FLUOROSCOPY TIME:  Fluoroscopy Time:  1 minutes and 24 seconds Radiation Exposure Index (if provided by the fluoroscopic device): 47.2 mGy COMPARISON:  01/12/2018 FINDINGS: Patient drank water soluble contrast in a semi upright position. The esophagus is diffusely dilated. Contrast pools in the distal esophagus. Luminal narrowing at the GE junction, presumably at the fundoplication region. Contrast drains into the stomach. There is no evidence for extravasation or leak. Contrast travels through the stomach into the duodenum. The duodenum is mildly distended. Intermittent fluoroscopy was obtained with the patient in an upright position to evaluate the retained contrast  in the distal esophagus. There was pooling contrast in the distal esophagus approximately 5 minutes after drinking. Patient was asymptomatic throughout the procedure. IMPRESSION: 1. No evidence for a postoperative leak. 2. Diffusely dilated esophagus. Luminal narrowing presumably related to the fundoplication and postoperative edema. Contrast drains into the stomach and duodenum. 3. Retained contrast in the distal esophagus at the end of the procedure. 4. These results were called by telephone at the time of interpretation on 06/06/2018 at 10:58 am to Dr. Rolm Bookbinder, who verbally acknowledged these results. Electronically Signed   By: Markus Daft M.D.   On: 06/06/2018 11:01    Microbiology: No results found for this or any previous visit (from the past 240 hour(s)).   Labs: Basic Metabolic Panel: Recent Labs  Lab 06/14/18 1328 06/15/18 0543 06/16/18 0401  NA 137 138 138  K 3.5 3.3* 4.2  CL 108 108 109  CO2 23 22 21*  GLUCOSE 110* 114* 113*  BUN 15 12 10   CREATININE 1.60* 1.44* 1.35*  CALCIUM 7.9* 8.0* 8.2*  MG 2.0  --   --    Liver Function Tests: Recent Labs  Lab 06/14/18 1328 06/15/18 0543  AST 15 16  ALT 21 17  ALKPHOS 58 59  BILITOT 0.7 0.4  PROT 5.7* 5.3*  ALBUMIN 2.3* 2.3*   No results for input(s): LIPASE, AMYLASE in the last 168 hours. No results for input(s): AMMONIA in the last 168 hours. CBC: Recent Labs  Lab 06/14/18 1328 06/15/18 0543 06/16/18 0401  WBC 9.5 7.6 8.7  NEUTROABS 7.8*  --   --   HGB 9.1* 8.8* 8.6*  HCT 30.7* 28.3* 27.7*  MCV 86.7 85.5 85.0  PLT 332 329 361   Cardiac Enzymes: No results for input(s): CKTOTAL, CKMB, CKMBINDEX, TROPONINI in the  last 168 hours. BNP: BNP (last 3 results) Recent Labs    06/14/18 1328  BNP 570.9*    ProBNP (last 3 results) No results for input(s): PROBNP in the last 8760 hours.  CBG: No results for input(s): GLUCAP in the last 168 hours.     Signed:  Kayleen Memos, MD [facility] 06/17/2018, 10:08 AM

## 2018-06-17 NOTE — Clinical Social Work Note (Signed)
Readmission prevention screening complete.  Skyllar Notarianni, CSW [phone]  

## 2018-06-17 NOTE — Progress Notes (Signed)
Progress Note  Patient Name: [name] Date of Encounter: 06/17/2018  Primary Cardiologist: Kate Sable, MD   Subjective   Walking in the hallway with physical therapy, continues to feel greatly improved. Telemetry this morning shows very recent onset of recurrent paroxysmal atrial fibrillation with a heart rate around 130 bpm.  She has had brief bursts of atrial tachycardia and very brief paroxysmal atrial fibrillation overnight as well.  He seems to be unaware of these brief events.  Inpatient Medications    Scheduled Meds: . amiodarone  400 mg Oral Daily  . apixaban  5 mg Oral BID  . citalopram  40 mg Oral Daily  . ferrous sulfate  325 mg Oral Q breakfast  . fluticasone  2 spray Each Nare Daily  . hydrALAZINE  50 mg Oral Q8H  . isosorbide mononitrate  30 mg Oral q morning - 10a  . levothyroxine  50 mcg Oral QAC breakfast  . loratadine  10 mg Oral QHS  . losartan  50 mg Oral Daily  . metoprolol succinate  12.5 mg Oral Daily  . montelukast  10 mg Oral QHS  . pantoprazole  40 mg Oral Daily  . polyethylene glycol  17 g Oral Daily  . potassium chloride  10 mEq Oral Daily  . rosuvastatin  10 mg Oral Daily  . traZODone  100 mg Oral QHS  . Vitamin D (Ergocalciferol)  50,000 Units Oral Q Tue   Continuous Infusions:  PRN Meds: albuterol, ALPRAZolam, furosemide, meclizine, nitroGLYCERIN, nystatin, ondansetron **OR** ondansetron (ZOFRAN) IV, ondansetron, polyvinyl alcohol, traMADol, zolpidem   Vital Signs    Vitals:   06/16/18 1236 06/16/18 1940 06/17/18 0147 06/17/18 0615  BP: (!) 129/56 139/61  (!) 141/98  Pulse: 83 71  75  Resp: 18 18    Temp: 98.4 F (36.9 C) 98.9 F (37.2 C)  97.8 F (36.6 C)  TempSrc: Oral Oral  Oral  SpO2: 96% 94% 90% 91%  Weight:    72.8 kg  Height:        Intake/Output Summary (Last 24 hours) at 06/17/2018 0930 Last data filed at 06/17/2018 5364 Gross per 24 hour  Intake 460 ml  Output 100 ml  Net 360 ml   Last 3 Weights  06/17/2018 06/16/2018 06/15/2018  Weight (lbs) 160 lb 6.4 oz 160 lb 3.2 oz 159 lb 9.6 oz  Weight (kg) 72.757 kg 72.666 kg 72.394 kg      Telemetry    Mostly sinus rhythm with PACs, frequent brief bursts of paroxysmal atrial tachycardia overnight, currently in very recent onset paroxysmal atrial fibrillation with RVR, while walking with physical therapy.- Personally Reviewed  ECG    Today's ECG shows sinus rhythm with occasional atrial and ventricular ectopic beats, questionable old inferior wall myocardial infarction, no acute repolarization abnormalities, mildly prolonged QTC 497 ms- Personally Reviewed  Physical Exam  Appears well, walking at a moderately brisk pace GEN: No acute distress.   Neck: No JVD Cardiac:  irregular, tachycardic, no murmurs, rubs, or gallops.  Respiratory: Clear to auscultation bilaterally. GI: Soft, nontender, non-distended  MS: No edema; No deformity. Neuro:  Nonfocal  Psych: Normal affect   Labs    Chemistry Recent Labs  Lab 06/14/18 1328 06/15/18 0543 06/16/18 0401  NA 137 138 138  K 3.5 3.3* 4.2  CL 108 108 109  CO2 23 22 21*  GLUCOSE 110* 114* 113*  BUN 15 12 10   CREATININE 1.60* 1.44* 1.35*  CALCIUM 7.9* 8.0* 8.2*  PROT  5.7* 5.3*  --   ALBUMIN 2.3* 2.3*  --   AST 15 16  --   ALT 21 17  --   ALKPHOS 58 59  --   BILITOT 0.7 0.4  --   GFRNONAA 30* 34* 37*  GFRAA 35* 39* 43*  ANIONGAP 6 8 8      Hematology Recent Labs  Lab 06/14/18 1328 06/15/18 0543 06/16/18 0401  WBC 9.5 7.6 8.7  RBC 3.54* 3.31* 3.26*  HGB 9.1* 8.8* 8.6*  HCT 30.7* 28.3* 27.7*  MCV 86.7 85.5 85.0  MCH 25.7* 26.6 26.4  MCHC 29.6* 31.1 31.0  RDW 16.4* 16.2* 16.0*  PLT 332 329 361    Cardiac EnzymesNo results for input(s): TROPONINI in the last 168 hours.  Recent Labs  Lab 06/14/18 1340  TROPIPOC 1.16*     BNP Recent Labs  Lab 06/14/18 1328  BNP 570.9*     DDimer No results for input(s): DDIMER in the last 168 hours.   Radiology    No  results found.  Cardiac Studies   Echocardiogram 06/07/2018 IMPRESSIONS  1. The left ventricle has normal systolic function with an ejection fraction of 60-65%. The cavity size was normal. Left ventricular diastolic Doppler parameters are indeterminate in the setting of atrial fibrillation. No obvious wall motion  abnormalities. 2. The right ventricle has normal systolic function. The cavity was normal. There is no increase in right ventricular wall thickness. Right ventricular systolic pressure upper normal with an estimated pressure of 34.6 mmHg. 3. Left atrial size was moderately dilated. 4. The aortic valve is tricuspid. Mild calcification of the aortic valve. Moderate aortic annular calcification noted. 5. The mitral valve is normal in structure. Mitral valve regurgitation is mild to moderate by color flow Doppler. 6. The tricuspid valve is normal in structure. 7. The aortic root is normal in size and structure.   Patient Profile     84 y.o. female with a hx ofsignificant for HFpEF, A. Fibrecently started on Eliquis, CAD s/p PCI-LAD 05/2011 and colon cancer s/p colostomywho was recently admitted on 3/6/20with an incarcerated hernia and required extensive surgery (laparoscopic fundoplication). Cardiology was consulted on 06/06/18 for post operative atrial fibrillation and pt was started on amiodarone and Eliquis w/ plans to return back for outpatient DCCV. Pt discharged on 3/12 and has now been readmitted for SOB and weakness. She isbeing seen for the evaluation ofpersistent atrial fibrillation, but spontaneously converted to sinus rhythm on 3/16  Assessment & Plan    1. CHF: Clinically euvolemic 2.  Paroxysmal A. Fib: It is not surprising that she will continue to have intermittent episodes of atrial tachycardia and atrial fibrillation as the amiodarone continues to "kick in" over the next couple of months.  Continue amiodarone 400 mg daily until March 25 then 200 mg daily.   Appropriately anticoagulated.  [facility] will sign off.   Medication Recommendations:   Continue amiodarone 400 mg daily until March 25 then 200 mg daily;  apixaban 5 mg twice daily;  furosemide 40 mg daily; hydralazine 50 mg 3 times daily and isosorbide mononitrate 30 mg once daily; losartan 50 mg daily;  metoprolol succinate 12.5 mg once daily Other recommendations (labs, testing, etc): Liver function tests and thyroid function tests at least every 6 months while on amiodarone therapy; yearly ophthalmological exam Follow up as an outpatient: Under normal circumstances would like to see her back in office or the atrial fibrillation clinic within 1-2 weeks, but in the current challenging healthcare environment suggest  a transition of care phone call in 1-2 weeks and a follow-up office visit in 3 months  For questions or updates, please contact [place] Please consult [email or web address] for contact info under        Signed, Sanda Klein, MD  06/17/2018, 9:30 AM

## 2018-06-18 ENCOUNTER — Other Ambulatory Visit: Payer: Self-pay | Admitting: Physician Assistant

## 2018-06-18 ENCOUNTER — Telehealth: Payer: Self-pay | Admitting: Physician Assistant

## 2018-06-18 MED ORDER — BENZONATATE 200 MG PO CAPS: 200.0000 mg | ORAL_CAPSULE | Freq: Two times a day (BID) | ORAL | 0 refills | Status: DC | PRN

## 2018-06-18 NOTE — Progress Notes (Signed)
Tessalon

## 2018-06-18 NOTE — Telephone Encounter (Signed)
Tessalon 200 mg is sent to her pharmacy.

## 2018-06-18 NOTE — Telephone Encounter (Signed)
Says she can't sleep at night due to cough, no fever. Just got out of hosp yesterday

## 2018-06-18 NOTE — Telephone Encounter (Signed)
Pt aware and has already gotten

## 2018-06-19 ENCOUNTER — Telehealth: Payer: Self-pay | Admitting: Physician Assistant

## 2018-06-19 NOTE — Telephone Encounter (Signed)
Phone visit is okay.

## 2018-06-19 NOTE — Telephone Encounter (Signed)
Please review and advise.

## 2018-06-21 ENCOUNTER — Telehealth: Payer: Self-pay | Admitting: Surgery

## 2018-06-21 NOTE — Telephone Encounter (Signed)
[name] underwent HH repair by Dr. Johney Maine on 06/05/2018.  It sound like she was home and then got readmitted from 3/15 - 06/19/2018.  There are several notes in Epic since discharge.  The nurse called because of "black phlegm".  Ms. Brander is drinking liquids, has no nausea. She pulls 500 cc on her IS.  She is not febrile or tachcardic.  Encouraged to keep using the IS, drink plenty of fluids, and check with [place] or her PCP if no better in a day or two.  Alphonsa Overall, MD, [facility] Pager: [phone] Office phone:  [phone]

## 2018-06-23 ENCOUNTER — Ambulatory Visit (HOSPITAL_COMMUNITY): Payer: Medicare Other | Admitting: Nurse Practitioner

## 2018-06-23 ENCOUNTER — Ambulatory Visit (HOSPITAL_COMMUNITY)
Admission: RE | Admit: 2018-06-23 | Discharge: 2018-06-23 | Disposition: A | Discharge status: Home / Self Care | Payer: Medicare Other | Source: Ambulatory Visit | Attending: Nurse Practitioner | Admitting: Nurse Practitioner

## 2018-06-23 ENCOUNTER — Other Ambulatory Visit: Payer: Self-pay

## 2018-06-23 DIAGNOSIS — I4891 Unspecified atrial fibrillation: Secondary | ICD-10-CM

## 2018-06-23 DIAGNOSIS — I2510 Atherosclerotic heart disease of native coronary artery without angina pectoris: Secondary | ICD-10-CM | POA: Diagnosis not present

## 2018-06-23 DIAGNOSIS — I10 Essential (primary) hypertension: Secondary | ICD-10-CM | POA: Diagnosis not present

## 2018-06-23 NOTE — Progress Notes (Signed)
Electrophysiology TeleHealth Note   Due to national recommendations of social distancing due to [place] 19,  telehealth visit is felt to be most appropriate for this patient at this time.  See MyChart message/consent below from today for patient consent regarding telehealth for the [facility].    Date:  06/23/2018   ID:  [name], DOB [birth year], MRN [id number]  Location: home  Provider location: [address] [place], [place] [address] Evaluation Performed: new pt consult  PCP:  Terald Sleeper, PA-C  Primary Cardiologist: Dr. Bronson Ing  Primary Electrophysiologist:  None   CC: afib with RVR   History of Present Illness: [name] is a 84 y.o. female who presents via telepnone conferencing for a telehealth visit today.   The patient is referred for new consultation regarding afib by [facility] f/u.  Pt with h/o paroxysmal afib, Diastolic HF, CKD, CAD had a recent hernia repair and presented post op day 3 to [facility] ER  for weakness and found to be in afib with RVR. She was admitted,seen by cardiology and started on amiodarone but converted prior to TEE DCCV.  She reports by phone today that she is feeling improved except for some continued weakness. Her V/S this am with a BP of 115/78 and a HR of 63 bpm. She feels that her heart rate is regular. She is taking her eliquis 5 mg bid and no issues with bleeding.    Today, she denies symptoms of palpitations, chest pain, shortness of breath, orthopnea, PND, lower extremity edema, claudication, dizziness, presyncope, syncope, bleeding, or neurologic sequela. The patient is tolerating medications without difficulties and is otherwise without complaint today.   she denies symptoms of cough, fevers, chills, or new SOB worrisome for COVID 19.     Atrial Fibrillation Risk Factors:  she does not have symptoms or diagnosis of sleep apnea. she does not have a history of alcohol use. The patient does not have a history of early  familial atrial fibrillation or other arrhythmias.  she has a BMI of There is no height or weight on file to calculate BMI.. There were no vitals filed for this visit.  Past Medical History:  Diagnosis Date  . Anemia due to blood loss, chronic   . Anxiety state, unspecified   . Atrophy of left kidney   . Cancer ([place])    melanoma on back  . Cancer of right colon ([place]) 05/20/2017  . CKD (chronic kidney disease), stage III (HCC)      sees Dr. Lowanda Foster   . Colon cancer ([place]) 2019   stage 3  . Colostomy in place [facility])    in 2016  . Coronary artery disease cardiologist-  dr Bronson Ing   a. Moderate to severe coronary disease involving the left anterior descending artery and right coronary artery.  Sequential stenosis in the LAD is significant.  Right coronary artery is moderate to severely likely nonischemic. Questionable small LVOT obstruction.  No Brockenbrough maneuver was performed.  Catheterization January 2013 w/ PTCA and DES x1 to pLAD b. 05/2014 MV no isch/infarct, EF 60%.  . Depressive disorder, not elsewhere classified   . Diastolic CHF, chronic [facility])    cardiologist-  dr Bronson Ing   pt. denies at preop. 06/07/18 - EF 60-65% on echo, no obvious dysfunction appreciated  . Diversion colitis 2016  . Epidural abscess L5- S1 03/01/2016  . Gastro-esophageal reflux disease without esophagitis 07/16/2007   Overview:  Overview:  Qualifier: Diagnosis of  By: Harlon Ditty CMA (AAMA), Dottie  Overview:  Overview:  Overview:  Qualifier: Diagnosis of  By: Nelson-Smith CMA (AAMA), Dottie   . GERD (gastroesophageal reflux disease)   . H/O hiatal hernia   . History of [name]'s esophagus   . History of melanoma excision    back  . Hypertension   . Hypothyroidism   . Infection of spine ([place])    in hospial  . Macular degeneration    both eyes  . PAF (paroxysmal atrial fibrillation) ([place])   . Parastomal hernia   . Pneumonia   . PONV (postoperative nausea and vomiting)   . Pulmonary hypertension  ([place])    PA systolic [id number] mmHg by echocardiogram 06-17-2015  PA pressure 05-08-2011 by cardiac catheterization -- no sig. pulm. htn)PA saturation 62% thermodilution cardiac index 2.0 thick cardiac index 2.4  . Pure hypercholesterolemia   . S/P drug eluting coronary stent placement 05/08/2011   x1 DES to proximal LAD  . Symptomatic cholelithiasis 07/18/2016  . Tubulovillous adenoma of colon   . Wears dentures    lower  . Wears glasses    Past Surgical History:  Procedure Laterality Date  . CARDIAC CATHETERIZATION Left 03/05/2015   Procedure: CENTRAL LINE INSERTION;  Surgeon: Aviva Signs, MD;  Location: [facility];  Service: General;  Laterality: Left;  . CATARACT EXTRACTION W/ INTRAOCULAR LENS  IMPLANT, BILATERAL  ~ 2002  . CHOLECYSTECTOMY N/A 07/18/2016   Procedure: LAPAROSCOPIC CHOLECYSTECTOMY WITH INTRAOPERATIVE CHOLANGIOGRAM;  Surgeon: [name], MD;  Location: [facility];  Service: General;  Laterality: N/A;  . COLECTOMY WITH COLOSTOMY CREATION/HARTMANN PROCEDURE N/A 03/05/2015   Procedure: PARTIAL COLECTOMY WITH COLOSTOMY CREATION/HARTMANN PROCEDURE;  Surgeon: Aviva Signs, MD;  Location: [facility];  Service: General;  Laterality: N/A;  . COLON SURGERY     colostomy bag  LLQ  . ESOPHAGEAL MANOMETRY N/A 06/06/2014   Procedure: ESOPHAGEAL MANOMETRY (EM);  Surgeon: Jerene Bears, MD;  Location: [facility];  Service: Gastroenterology;  Laterality: N/A;  . ESOPHAGOGASTRODUODENOSCOPY (EGD) WITH PROPOFOL N/A 08/04/2015   Procedure: ESOPHAGOGASTRODUODENOSCOPY (EGD) WITH PROPOFOL;  Surgeon: Mauri Pole, MD;  Location: [facility];  Service: Endoscopy;  Laterality: N/A;  . HERNIA REPAIR  2019   left side/ removed mass  . IR GENERIC HISTORICAL  03/04/2016   IR LUMBAR DISC ASPIRATION W/IMG GUIDE 03/04/2016 Luanne Bras, MD [facility]-INTER[facility] RAD  . LAPAROSCOPIC NISSEN FUNDOPLICATION N/A 3/53/2992   Procedure: LAPAROSCOPIC  LYSIS OF ADHESIONS, SEGMENTAL GASTRECTOMY, PARTIAL REDUCTION OF HERNIA;   Surgeon: [name], MD;  Location: [facility];  Service: General;  Laterality: N/A;  . LAPAROSCOPIC PARTIAL COLECTOMY N/A 05/07/2017   Procedure: LAPAROSCOPIC  CONVERTED TO OPEN RIGHT COLECTOMY, PARASTOMAL HERNIA  REPAIR;  Surgeon: Leighton Ruff, MD;  Location: [facility];  Service: General;  Laterality: N/A;  . NISSEN FUNDOPLICATION  4268  . PERCUTANEOUS CORONARY STENT INTERVENTION (PCI-S) N/A 05/08/2011   Procedure: PERCUTANEOUS CORONARY STENT INTERVENTION (PCI-S);  Surgeon: Wellington Hampshire, MD;  Location: [facility] CATH LAB;  Service: Cardiovascular;  Laterality: N/A;  . ROTATOR CUFF REPAIR  ~ 2001   right  . SHOULDER ARTHROSCOPY  ~ 2004; 2005   left; "joint's wore out"  . TONSILLECTOMY  ~ 1944  . TUBAL LIGATION  1972     Current Outpatient Medications  Medication Sig Dispense Refill  . albuterol (PROVENTIL HFA;VENTOLIN HFA) 108 (90 Base) MCG/ACT inhaler Inhale 2 puffs into the lungs every 6 (six) hours as needed for wheezing or shortness of breath. 18 g 2  . ALPRAZolam (  XANAX) 0.25 MG tablet TAKEK 1/2 TO 1 TABLET 3 TIMES DAILY AS NEEDED (Patient taking differently: Take 0.125-0.25 mg by mouth 3 (three) times daily as needed for anxiety. ) 270 tablet 1  . amiodarone (PACERONE) 200 MG tablet Take 2 tablets (400 mg total) by mouth daily. Please take 400 mg total by mouth daily for 7 days (end date 06/24/18) Then 200 mg total by mouth daily for remaining days 70 tablet 0  . apixaban (ELIQUIS) 5 MG TABS tablet Take 1 tablet (5 mg total) by mouth 2 (two) times daily. 60 tablet 2  . benzonatate (TESSALON) 200 MG capsule Take 1 capsule (200 mg total) by mouth 2 (two) times daily as needed for cough. 20 capsule 0  . citalopram (CELEXA) 40 MG tablet TAKE ONE (1) TABLET EACH DAY (Patient taking differently: Take 40 mg by mouth daily. ) 90 tablet 3  . denosumab (PROLIA) 60 MG/ML SOSY injection Inject 60 mg into the skin every 6 (six) months. 1 mL 0  . esomeprazole (NEXIUM) 40 MG capsule TAKE ONE (1) CAPSULE  EACH DAY (Patient taking differently: Take 40 mg by mouth daily. ) 90 capsule 3  . ferrous sulfate 325 (65 FE) MG EC tablet Take 325 mg by mouth daily with breakfast.     . fluticasone (FLONASE) 50 MCG/ACT nasal spray USE 2 SPRAYS IN EACH NOSTRIL DAILY (Patient taking differently: Place 2 sprays into both nostrils daily. ) 48 g 9  . furosemide (LASIX) 40 MG tablet Take 40 mg by mouth as needed for fluid.     . hydrALAZINE (APRESOLINE) 50 MG tablet TAKE ONE (1) TABLET THREE (3) TIMES EACH DAY (Patient taking differently: Take 50 mg by mouth 3 (three) times daily. ) 270 tablet 3  . isosorbide mononitrate (IMDUR) 30 MG 24 hr tablet Take 1 tablet (30 mg total) by mouth every morning. 90 tablet 3  . levothyroxine (SYNTHROID, LEVOTHROID) 50 MCG tablet TAKE ONE TABLET EACH MORNING BEFORE BREAKFAST (Patient taking differently: Take 50 mcg by mouth daily before breakfast. ) 90 tablet 3  . loratadine (CLARITIN) 10 MG tablet Take 1 tablet (10 mg total) by mouth at bedtime. 90 tablet 3  . losartan (COZAAR) 100 MG tablet TAKE ONE (1) TABLET EACH DAY (Patient taking differently: Take 50 mg by mouth daily. ) 90 tablet 3  . Meclizine HCl 25 MG CHEW TAKE 1 TABLET 3 TIMES DAILY AS NEEDED FOR DIZZINESS (Patient taking differently: Chew 25 mg by mouth 3 (three) times daily as needed (dizziness). ) 30 each 0  . metoprolol succinate (TOPROL-XL) 25 MG 24 hr tablet Take 0.5 tablets (12.5 mg total) by mouth daily. 90 tablet 3  . montelukast (SINGULAIR) 10 MG tablet Take 1 tablet (10 mg total) by mouth at bedtime. 90 tablet 3  . nitroGLYCERIN (NITROSTAT) 0.4 MG SL tablet Place 1 tablet (0.4 mg total) under the tongue every 5 (five) minutes as needed for chest pain. Reported on 04/26/2015 25 tablet 3  . nystatin (MYCOSTATIN/NYSTOP) powder Apply topically 4 (four) times daily. (Patient taking differently: Apply 1 Bottle topically as needed (yeast). ) 120 g 5  . ondansetron (ZOFRAN-ODT) 4 MG disintegrating tablet Take 1 tablet (4  mg total) by mouth every 6 (six) hours as needed for nausea or vomiting. 6 tablet 12  . polyethylene glycol powder (GAVILAX) powder MIX 17 GRAMS IN 80Z OF WATER/JUICE AND DRINK TWICE DAILY (Patient taking differently: Take 17 g by mouth daily. ) 1020 g 3  . Propylene Glycol (  SYSTANE BALANCE OP) Apply 1 drop to eye daily as needed (dry eyes).    . rosuvastatin (CRESTOR) 10 MG tablet TAKE ONE (1) TABLET EACH DAY (Patient taking differently: Take 10 mg by mouth daily. ) 90 tablet 3  . traMADol (ULTRAM) 50 MG tablet Take 1 tablet (50 mg total) by mouth every 12 (twelve) hours as needed. (Patient taking differently: Take 50 mg by mouth daily as needed for moderate pain. ) 20 tablet 1  . Vitamin D, Ergocalciferol, (DRISDOL) 50000 units CAPS capsule TAKE 1 CAPSULE ON TUESDAYS (Patient taking differently: Take 50,000 Units by mouth every Tuesday. ) 12 capsule 3   No current facility-administered medications for this encounter.     Allergies:   Sulfamethoxazole   Social History:  The patient  reports that she quit smoking about 30 years ago. Her smoking use included cigarettes. She started smoking about 59 years ago. She has a 20.00 pack-year smoking history. She has never used smokeless tobacco. She reports that she does not drink alcohol or use drugs.   Family History:  The patient's  family history includes Atrial fibrillation in her sister; Heart attack in her father; Hypertension in her father and mother; Skin cancer in her brother and sister.    ROS:  Please see the history of present illness.   All other systems are personally reviewed and negative.   Exam: N/A  Recent Labs: 06/08/2018: TSH 1.950 06/14/2018: B Natriuretic Peptide 570.9; Magnesium 2.0 06/15/2018: ALT 17 06/16/2018: BUN 10; Creatinine, Ser 1.35; Hemoglobin 8.6; Platelets 361; Potassium 4.2; Sodium 138  personally reviewed    Other studies personally reviewed: Additional studies/ records that were reviewed today include: EPIC  NOTES,LABS, EKG   Prior radiographs  IMPRESSION: Small bilateral pleural effusions without pulmonary edema or infiltrates. Echo-   1. The left ventricle has normal systolic function with an ejection fraction of 60-65%. The cavity size was normal. Left ventricular diastolic Doppler parameters are indeterminate in the setting of atrial fibrillation. No obvious wall motion  abnormalities.  2. The right ventricle has normal systolic function. The cavity was normal. There is no increase in right ventricular wall thickness. Right ventricular systolic pressure upper normal with an estimated pressure of 34.6 mmHg.  3. Left atrial size was moderately dilated.  4. The aortic valve is tricuspid. Mild calcification of the aortic valve. Moderate aortic annular calcification noted.  5. The mitral valve is normal in structure. Mitral valve regurgitation is mild to moderate by color flow Doppler.  6. The tricuspid valve is normal in structure.  7. The aortic root is normal in size and structure.     ASSESSMENT AND PLAN:  1.  Atrial fibrillation with RVR Pt with known paroxysmal  afib but with onset of RVR after hernia surgery She was started on IV amio then d/c on 400 mg daily x 7 days and now will reduce to 200 mg daily She appears by V/S reported to continue in SR, HR reported 63 and regular  2. HTN Stable  BP reported at 115/78  3. CAD No anginal symptoms  4. This patients CHA2DS2-VASc Score and unadjusted Ischemic Stroke Rate (% per year) is equal to 7.2 % stroke rate/year from a score of 5  Bleeding  precautions reviewed  Above score calculated as 1 point each if present [CHF, HTN, DM, Vascular=MI/PAD/Aortic Plaque, Age if 65-74, or Female] Above score calculated as 2 points each if present [Age > 75, or Stroke/TIA/TE]    COVID screen The patient  does not have any symptoms that suggest any further testing/ screening at this time.  Social distancing reinforced today.    Follow-up:   One month with Dr. Bronson Ing  Current medicines are reviewed at length with the patient today.   The patient does not have concerns regarding her medicines.  The following changes were made today:  none  Labs/ tests ordered today include No orders of the defined types were placed in this encounter.   Patient Risk:  after full review of this patients clinical status, I feel that they are at moderate risk at this time.   Today, I have spent 10 minutes with the patient with telehealth technology discussing afib   Signed, Roderic Palau, NP 06/23/2018 11:44 AM  Afib [facility] [address] [place], [place] [address] [phone]   I hereby voluntarily request, consent and authorize the [facility] and its employed or contracted physicians, physician assistants, nurse practitioners or other licensed health care professionals (the Practitioner), to provide me with telemedicine health care services (the "Services") as deemed necessary by the treating Practitioner. I acknowledge and consent to receive the Services by the Practitioner via telemedicine. I understand that the telemedicine visit will involve communicating with the Practitioner through live audiovisual communication technology and the disclosure of certain medical information by electronic transmission. I acknowledge that I have been given the opportunity to request an in-person assessment or other available alternative prior to the telemedicine visit and am voluntarily participating in the telemedicine visit.   I understand that I have the right to withhold or withdraw my consent to the use of telemedicine in the course of my care at any time, without affecting my right to future care or treatment, and that the Practitioner or I may terminate the telemedicine visit at any time. I understand that I have the right to inspect all information obtained and/or recorded in the course of the telemedicine  visit and may receive copies of available information for a reasonable fee.  I understand that some of the potential risks of receiving the Services via telemedicine include:   Delay or interruption in medical evaluation due to technological equipment failure or disruption;  Information transmitted may not be sufficient (e.g. poor resolution of images) to allow for appropriate medical decision making by the Practitioner; and/or  In rare instances, security protocols could fail, causing a breach of personal health information.   Furthermore, I acknowledge that it is my responsibility to provide information about my medical history, conditions and care that is complete and accurate to the best of my ability. I acknowledge that Practitioner's advice, recommendations, and/or decision may be based on factors not within their control, such as incomplete or inaccurate data provided by me or distortions of diagnostic images or specimens that may result from electronic transmissions. I understand that the practice of medicine is not an exact science and that Practitioner makes no warranties or guarantees regarding treatment outcomes. I acknowledge that I will receive a copy of this consent concurrently upon execution via email to the email address I last provided but may also request a printed copy by calling the office of the [facility].  I understand that my insurance will be billed for this visit.   I have read or had this consent read to me.  I understand the contents of this consent, which adequately explains the benefits and risks of the Services being provided via telemedicine.  I have been provided ample opportunity to  ask questions regarding this consent and the Services and have had my questions answered to my satisfaction.  I give my informed consent for the services to be provided through the use of telemedicine in my medical care  By participating in this telemedicine visit I agree  to the above.

## 2018-06-24 NOTE — Telephone Encounter (Signed)
Pt aware a phone visit is ok and that the call could come at any time during the day. Pt states she will not be going anywhere

## 2018-06-25 ENCOUNTER — Ambulatory Visit: Payer: Medicare Other | Admitting: Cardiovascular Disease

## 2018-06-29 ENCOUNTER — Other Ambulatory Visit: Payer: Self-pay | Admitting: Physician Assistant

## 2018-06-29 ENCOUNTER — Telehealth: Payer: Self-pay | Admitting: Physician Assistant

## 2018-06-29 MED ORDER — HYDROCODONE-HOMATROPINE 5-1.5 MG/5ML PO SYRP: 5.0000 mL | ORAL_SOLUTION | Freq: Three times a day (TID) | ORAL | 0 refills | Status: DC | PRN

## 2018-06-29 NOTE — Telephone Encounter (Signed)
Patient aware.

## 2018-06-29 NOTE — Telephone Encounter (Signed)
Sent hycodan to the pharmacy

## 2018-06-29 NOTE — Telephone Encounter (Signed)
Please advise on refill.

## 2018-06-29 NOTE — Telephone Encounter (Signed)
What is the name of the medication? Cough medicine   Have you contacted your pharmacy to request a refill? Cough medicine-pt did not know the name of med  Which pharmacy would you like this sent to? [facility]   Patient notified that their request is being sent to the clinical staff for review and that they should receive a call once it is complete. If they do not receive a call within 24 hours they can check with their pharmacy or our office.

## 2018-06-30 ENCOUNTER — Other Ambulatory Visit: Payer: Self-pay

## 2018-06-30 ENCOUNTER — Ambulatory Visit (INDEPENDENT_AMBULATORY_CARE_PROVIDER_SITE_OTHER): Payer: Medicare Other | Admitting: Physician Assistant

## 2018-06-30 ENCOUNTER — Encounter: Payer: Self-pay | Admitting: Physician Assistant

## 2018-06-30 DIAGNOSIS — I4891 Unspecified atrial fibrillation: Secondary | ICD-10-CM | POA: Diagnosis not present

## 2018-06-30 DIAGNOSIS — K440 Diaphragmatic hernia with obstruction, without gangrene: Secondary | ICD-10-CM

## 2018-06-30 NOTE — Progress Notes (Signed)
Telephone visit  Subjective: CC: Incarcerated recurrent hiatal hernia recent repair, atrial fibrillation PCP: Terald Sleeper, PA-C HPI:[name] is a 84 y.o. female calls for telephone consult today. Patient provides verbal consent for consult held via phone.  Patient is identified with 2 separate identifiers.  At this time the entire area is on COVID-19 social distancing and stay home orders are in place.  Patient is of higher risk and therefore we are performing this by a virtual method.  Location of provider: home Location of patient: home Others present for call: no  Visit is for short follow-up for the patient having been in the hospital having a hiatal hernia repair.  This was performed on June 05, 2018.  She reports she is feeling incredibly better.  She states she has never felt so good in many many years with her stomach.  She is up to soft foods and tolerating that well.  She did have a change in the amiodarone.  They went from 400 mg.  And had her after 7 days go down to 200 mg.  When she went down to that dose she had a great increase in her cardiac palpitations and symptoms.  I have asked her to contact her cardiologist.  They will probably give her another phone visit like we have.  For now I have asked her to stay on amiodarone 400 mg daily.  She is to contact us if there is any changes.   ROS: Per HPI  Allergies  Allergen Reactions  . Sulfamethoxazole Rash   Past Medical History:  Diagnosis Date  . Anemia due to blood loss, chronic   . Anxiety state, unspecified   . Atrophy of left kidney   . Cancer ([place])    melanoma on back  . Cancer of right colon ([place]) 05/20/2017  . CKD (chronic kidney disease), stage III (HCC)      sees Dr. Lowanda Foster   . Colon cancer ([place]) 2019   stage 3  . Colostomy in place [facility])    in 2016  . Coronary artery disease cardiologist-  dr Bronson Ing   a. Moderate to severe coronary disease involving the left anterior descending artery and  right coronary artery.  Sequential stenosis in the LAD is significant.  Right coronary artery is moderate to severely likely nonischemic. Questionable small LVOT obstruction.  No Brockenbrough maneuver was performed.  Catheterization January 2013 w/ PTCA and DES x1 to pLAD b. 05/2014 MV no isch/infarct, EF 60%.  . Depressive disorder, not elsewhere classified   . Diastolic CHF, chronic [facility])    cardiologist-  dr Bronson Ing   pt. denies at preop. 06/07/18 - EF 60-65% on echo, no obvious dysfunction appreciated  . Diversion colitis 2016  . Epidural abscess L5- S1 03/01/2016  . Gastro-esophageal reflux disease without esophagitis 07/16/2007   Overview:  Overview:  Qualifier: Diagnosis of  By: Nelson-Smith CMA (AAMA), Dottie  Overview:  Overview:  Overview:  Qualifier: Diagnosis of  By: Nelson-Smith CMA (AAMA), Dottie   . GERD (gastroesophageal reflux disease)   . H/O hiatal hernia   . History of [name]'s esophagus   . History of melanoma excision    back  . Hypertension   . Hypothyroidism   . Infection of spine ([place])    in hospial  . Macular degeneration    both eyes  . PAF (paroxysmal atrial fibrillation) ([place])   . Parastomal hernia   . Pneumonia   . PONV (postoperative nausea and vomiting)   .  Pulmonary hypertension ([place])    PA systolic [id number] mmHg by echocardiogram 06-17-2015  PA pressure 05-08-2011 by cardiac catheterization -- no sig. pulm. htn)PA saturation 62% thermodilution cardiac index 2.0 thick cardiac index 2.4  . Pure hypercholesterolemia   . S/P drug eluting coronary stent placement 05/08/2011   x1 DES to proximal LAD  . Symptomatic cholelithiasis 07/18/2016  . Tubulovillous adenoma of colon   . Wears dentures    lower  . Wears glasses     Current Outpatient Medications:  .  albuterol (PROVENTIL HFA;VENTOLIN HFA) 108 (90 Base) MCG/ACT inhaler, Inhale 2 puffs into the lungs every 6 (six) hours as needed for wheezing or shortness of breath., Disp: 18 g, Rfl: 2 .   ALPRAZolam (XANAX) 0.25 MG tablet, TAKEK 1/2 TO 1 TABLET 3 TIMES DAILY AS NEEDED (Patient taking differently: Take 0.125-0.25 mg by mouth 3 (three) times daily as needed for anxiety. ), Disp: 270 tablet, Rfl: 1 .  amiodarone (PACERONE) 200 MG tablet, Take 2 tablets (400 mg total) by mouth daily. Please take 400 mg total by mouth daily for 7 days (end date 06/24/18) Then 200 mg total by mouth daily for remaining days, Disp: 70 tablet, Rfl: 0 .  apixaban (ELIQUIS) 5 MG TABS tablet, Take 1 tablet (5 mg total) by mouth 2 (two) times daily., Disp: 60 tablet, Rfl: 2 .  benzonatate (TESSALON) 200 MG capsule, Take 1 capsule (200 mg total) by mouth 2 (two) times daily as needed for cough., Disp: 20 capsule, Rfl: 0 .  citalopram (CELEXA) 40 MG tablet, TAKE ONE (1) TABLET EACH DAY (Patient taking differently: Take 40 mg by mouth daily. ), Disp: 90 tablet, Rfl: 3 .  denosumab (PROLIA) 60 MG/ML SOSY injection, Inject 60 mg into the skin every 6 (six) months., Disp: 1 mL, Rfl: 0 .  esomeprazole (NEXIUM) 40 MG capsule, TAKE ONE (1) CAPSULE EACH DAY (Patient taking differently: Take 40 mg by mouth daily. ), Disp: 90 capsule, Rfl: 3 .  ferrous sulfate 325 (65 FE) MG EC tablet, Take 325 mg by mouth daily with breakfast. , Disp: , Rfl:  .  fluticasone (FLONASE) 50 MCG/ACT nasal spray, USE 2 SPRAYS IN EACH NOSTRIL DAILY (Patient taking differently: Place 2 sprays into both nostrils daily. ), Disp: 48 g, Rfl: 9 .  furosemide (LASIX) 40 MG tablet, Take 40 mg by mouth as needed for fluid. , Disp: , Rfl:  .  hydrALAZINE (APRESOLINE) 50 MG tablet, TAKE ONE (1) TABLET THREE (3) TIMES EACH DAY (Patient taking differently: Take 50 mg by mouth 3 (three) times daily. ), Disp: 270 tablet, Rfl: 3 .  HYDROcodone-homatropine (HYCODAN) 5-1.5 MG/5ML syrup, Take 5 mLs by mouth every 8 (eight) hours as needed for cough., Disp: 120 mL, Rfl: 0 .  isosorbide mononitrate (IMDUR) 30 MG 24 hr tablet, Take 1 tablet (30 mg total) by mouth every  morning., Disp: 90 tablet, Rfl: 3 .  levothyroxine (SYNTHROID, LEVOTHROID) 50 MCG tablet, TAKE ONE TABLET EACH MORNING BEFORE BREAKFAST (Patient taking differently: Take 50 mcg by mouth daily before breakfast. ), Disp: 90 tablet, Rfl: 3 .  loratadine (CLARITIN) 10 MG tablet, Take 1 tablet (10 mg total) by mouth at bedtime., Disp: 90 tablet, Rfl: 3 .  losartan (COZAAR) 100 MG tablet, TAKE ONE (1) TABLET EACH DAY (Patient taking differently: Take 50 mg by mouth daily. ), Disp: 90 tablet, Rfl: 3 .  Meclizine HCl 25 MG CHEW, TAKE 1 TABLET 3 TIMES DAILY AS NEEDED FOR DIZZINESS (Patient taking  differently: Chew 25 mg by mouth 3 (three) times daily as needed (dizziness). ), Disp: 30 each, Rfl: 0 .  metoprolol succinate (TOPROL-XL) 25 MG 24 hr tablet, Take 0.5 tablets (12.5 mg total) by mouth daily., Disp: 90 tablet, Rfl: 3 .  montelukast (SINGULAIR) 10 MG tablet, Take 1 tablet (10 mg total) by mouth at bedtime., Disp: 90 tablet, Rfl: 3 .  nitroGLYCERIN (NITROSTAT) 0.4 MG SL tablet, Place 1 tablet (0.4 mg total) under the tongue every 5 (five) minutes as needed for chest pain. Reported on 04/26/2015, Disp: 25 tablet, Rfl: 3 .  nystatin (MYCOSTATIN/NYSTOP) powder, Apply topically 4 (four) times daily. (Patient taking differently: Apply 1 Bottle topically as needed (yeast). ), Disp: 120 g, Rfl: 5 .  ondansetron (ZOFRAN-ODT) 4 MG disintegrating tablet, Take 1 tablet (4 mg total) by mouth every 6 (six) hours as needed for nausea or vomiting., Disp: 6 tablet, Rfl: 12 .  polyethylene glycol powder (GAVILAX) powder, MIX 17 GRAMS IN 80Z OF WATER/JUICE AND DRINK TWICE DAILY (Patient taking differently: Take 17 g by mouth daily. ), Disp: 1020 g, Rfl: 3 .  Propylene Glycol (SYSTANE BALANCE OP), Apply 1 drop to eye daily as needed (dry eyes)., Disp: , Rfl:  .  rosuvastatin (CRESTOR) 10 MG tablet, TAKE ONE (1) TABLET EACH DAY (Patient taking differently: Take 10 mg by mouth daily. ), Disp: 90 tablet, Rfl: 3 .  traMADol  (ULTRAM) 50 MG tablet, Take 1 tablet (50 mg total) by mouth every 12 (twelve) hours as needed. (Patient taking differently: Take 50 mg by mouth daily as needed for moderate pain. ), Disp: 20 tablet, Rfl: 1 .  Vitamin D, Ergocalciferol, (DRISDOL) 50000 units CAPS capsule, TAKE 1 CAPSULE ON TUESDAYS (Patient taking differently: Take 50,000 Units by mouth every Tuesday. ), Disp: 12 capsule, Rfl: 3  Assessment/ Plan: 84 y.o. female   1. Incarcerated recurrent hiatal hernia s/p robotic repair 06/05/2018 Follow instructions of surgeon  2. Atrial fibrillation with RVR (HCC) Continue amiodarone 400 mg until she can speak with cardiology   Start time: 2:12 PM End time: 2:23 PM  No orders of the defined types were placed in this encounter.   Particia Nearing PA-C [place] [phone]

## 2018-07-06 ENCOUNTER — Ambulatory Visit (INDEPENDENT_AMBULATORY_CARE_PROVIDER_SITE_OTHER): Payer: Medicare Other

## 2018-07-06 ENCOUNTER — Other Ambulatory Visit: Payer: Self-pay

## 2018-07-06 DIAGNOSIS — Z933 Colostomy status: Secondary | ICD-10-CM

## 2018-07-06 DIAGNOSIS — I130 Hypertensive heart and chronic kidney disease with heart failure and stage 1 through stage 4 chronic kidney disease, or unspecified chronic kidney disease: Secondary | ICD-10-CM | POA: Diagnosis not present

## 2018-07-06 DIAGNOSIS — Z7901 Long term (current) use of anticoagulants: Secondary | ICD-10-CM

## 2018-07-06 DIAGNOSIS — Z48815 Encounter for surgical aftercare following surgery on the digestive system: Secondary | ICD-10-CM | POA: Diagnosis not present

## 2018-07-06 DIAGNOSIS — I509 Heart failure, unspecified: Secondary | ICD-10-CM | POA: Diagnosis not present

## 2018-07-06 DIAGNOSIS — F419 Anxiety disorder, unspecified: Secondary | ICD-10-CM

## 2018-07-06 DIAGNOSIS — I4891 Unspecified atrial fibrillation: Secondary | ICD-10-CM

## 2018-07-06 DIAGNOSIS — E1122 Type 2 diabetes mellitus with diabetic chronic kidney disease: Secondary | ICD-10-CM

## 2018-07-06 DIAGNOSIS — N189 Chronic kidney disease, unspecified: Secondary | ICD-10-CM

## 2018-07-06 DIAGNOSIS — D631 Anemia in chronic kidney disease: Secondary | ICD-10-CM

## 2018-07-06 DIAGNOSIS — I214 Non-ST elevation (NSTEMI) myocardial infarction: Secondary | ICD-10-CM

## 2018-07-06 DIAGNOSIS — I272 Pulmonary hypertension, unspecified: Secondary | ICD-10-CM

## 2018-07-08 ENCOUNTER — Telehealth: Payer: Self-pay | Admitting: *Deleted

## 2018-07-08 NOTE — Telephone Encounter (Signed)
Left detailed mssg on [name]'s VM

## 2018-07-08 NOTE — Telephone Encounter (Signed)
VM from [place] w/ [facility] Pt c/o urinary frequency, abd pressure, pt looks pale as well, HA & nausea Would like order for UA and CBC, and verbal order to cont skilled nursing and aide Please advise

## 2018-07-08 NOTE — Telephone Encounter (Signed)
Order U/A, CBC and continue skilled nursing and aid

## 2018-07-09 ENCOUNTER — Telehealth: Payer: Self-pay

## 2018-07-09 NOTE — Telephone Encounter (Signed)
Received a call from [place] with [facility] that she just left the patient and she is having a lot of SOB and gasping for breath. States patient does not want to go to ER since COVID. Resting oxygen level is 93 and states when patient starts to stand or walk her oxygen drops to 82. Patient is not on oxygen.     Called patient and advised that she needed to go to the ER due to the dropping in her oxygen level. Patient verbalizes understanding and states she would go to ER.

## 2018-07-14 ENCOUNTER — Other Ambulatory Visit: Payer: Self-pay

## 2018-07-14 ENCOUNTER — Inpatient Hospital Stay (HOSPITAL_COMMUNITY)
Admission: EM | Admit: 2018-07-14 | Discharge: 2018-07-18 | DRG: 186 | Disposition: A | Discharge status: Home Health | Payer: Medicare Other | Attending: Internal Medicine | Admitting: Internal Medicine

## 2018-07-14 ENCOUNTER — Telehealth: Payer: Self-pay | Admitting: *Deleted

## 2018-07-14 ENCOUNTER — Emergency Department (HOSPITAL_COMMUNITY): Payer: Medicare Other

## 2018-07-14 ENCOUNTER — Encounter (HOSPITAL_COMMUNITY): Payer: Self-pay | Admitting: *Deleted

## 2018-07-14 DIAGNOSIS — E7800 Pure hypercholesterolemia, unspecified: Secondary | ICD-10-CM | POA: Diagnosis present

## 2018-07-14 DIAGNOSIS — I5032 Chronic diastolic (congestive) heart failure: Secondary | ICD-10-CM | POA: Diagnosis not present

## 2018-07-14 DIAGNOSIS — Z8673 Personal history of transient ischemic attack (TIA), and cerebral infarction without residual deficits: Secondary | ICD-10-CM | POA: Diagnosis not present

## 2018-07-14 DIAGNOSIS — R9431 Abnormal electrocardiogram [ECG] [EKG]: Secondary | ICD-10-CM

## 2018-07-14 DIAGNOSIS — Y95 Nosocomial condition: Secondary | ICD-10-CM | POA: Diagnosis present

## 2018-07-14 DIAGNOSIS — R0602 Shortness of breath: Secondary | ICD-10-CM | POA: Diagnosis present

## 2018-07-14 DIAGNOSIS — K219 Gastro-esophageal reflux disease without esophagitis: Secondary | ICD-10-CM | POA: Diagnosis not present

## 2018-07-14 DIAGNOSIS — I2510 Atherosclerotic heart disease of native coronary artery without angina pectoris: Secondary | ICD-10-CM | POA: Diagnosis not present

## 2018-07-14 DIAGNOSIS — Z7901 Long term (current) use of anticoagulants: Secondary | ICD-10-CM

## 2018-07-14 DIAGNOSIS — F411 Generalized anxiety disorder: Secondary | ICD-10-CM | POA: Diagnosis not present

## 2018-07-14 DIAGNOSIS — Z85820 Personal history of malignant melanoma of skin: Secondary | ICD-10-CM | POA: Diagnosis not present

## 2018-07-14 DIAGNOSIS — I272 Pulmonary hypertension, unspecified: Secondary | ICD-10-CM | POA: Diagnosis present

## 2018-07-14 DIAGNOSIS — I10 Essential (primary) hypertension: Secondary | ICD-10-CM | POA: Diagnosis present

## 2018-07-14 DIAGNOSIS — H3530 Unspecified macular degeneration: Secondary | ICD-10-CM | POA: Diagnosis present

## 2018-07-14 DIAGNOSIS — Z20828 Contact with and (suspected) exposure to other viral communicable diseases: Secondary | ICD-10-CM | POA: Diagnosis present

## 2018-07-14 DIAGNOSIS — J9601 Acute respiratory failure with hypoxia: Secondary | ICD-10-CM | POA: Diagnosis present

## 2018-07-14 DIAGNOSIS — Z85038 Personal history of other malignant neoplasm of large intestine: Secondary | ICD-10-CM

## 2018-07-14 DIAGNOSIS — F329 Major depressive disorder, single episode, unspecified: Secondary | ICD-10-CM | POA: Diagnosis not present

## 2018-07-14 DIAGNOSIS — Z955 Presence of coronary angioplasty implant and graft: Secondary | ICD-10-CM | POA: Diagnosis not present

## 2018-07-14 DIAGNOSIS — Z79890 Hormone replacement therapy (postmenopausal): Secondary | ICD-10-CM

## 2018-07-14 DIAGNOSIS — J189 Pneumonia, unspecified organism: Secondary | ICD-10-CM | POA: Diagnosis not present

## 2018-07-14 DIAGNOSIS — T462X5A Adverse effect of other antidysrhythmic drugs, initial encounter: Secondary | ICD-10-CM | POA: Diagnosis present

## 2018-07-14 DIAGNOSIS — E039 Hypothyroidism, unspecified: Secondary | ICD-10-CM | POA: Diagnosis present

## 2018-07-14 DIAGNOSIS — M5136 Other intervertebral disc degeneration, lumbar region: Secondary | ICD-10-CM | POA: Diagnosis not present

## 2018-07-14 DIAGNOSIS — K440 Diaphragmatic hernia with obstruction, without gangrene: Secondary | ICD-10-CM | POA: Diagnosis present

## 2018-07-14 DIAGNOSIS — I480 Paroxysmal atrial fibrillation: Secondary | ICD-10-CM | POA: Diagnosis not present

## 2018-07-14 DIAGNOSIS — Z87891 Personal history of nicotine dependence: Secondary | ICD-10-CM

## 2018-07-14 DIAGNOSIS — J90 Pleural effusion, not elsewhere classified: Principal | ICD-10-CM | POA: Diagnosis present

## 2018-07-14 DIAGNOSIS — I130 Hypertensive heart and chronic kidney disease with heart failure and stage 1 through stage 4 chronic kidney disease, or unspecified chronic kidney disease: Secondary | ICD-10-CM | POA: Diagnosis present

## 2018-07-14 DIAGNOSIS — N183 Chronic kidney disease, stage 3 unspecified: Secondary | ICD-10-CM | POA: Diagnosis present

## 2018-07-14 DIAGNOSIS — C182 Malignant neoplasm of ascending colon: Secondary | ICD-10-CM | POA: Diagnosis not present

## 2018-07-14 DIAGNOSIS — Z7951 Long term (current) use of inhaled steroids: Secondary | ICD-10-CM

## 2018-07-14 DIAGNOSIS — Z808 Family history of malignant neoplasm of other organs or systems: Secondary | ICD-10-CM

## 2018-07-14 DIAGNOSIS — F419 Anxiety disorder, unspecified: Secondary | ICD-10-CM | POA: Diagnosis present

## 2018-07-14 DIAGNOSIS — R0902 Hypoxemia: Secondary | ICD-10-CM

## 2018-07-14 DIAGNOSIS — Z8249 Family history of ischemic heart disease and other diseases of the circulatory system: Secondary | ICD-10-CM

## 2018-07-14 DIAGNOSIS — Z882 Allergy status to sulfonamides status: Secondary | ICD-10-CM

## 2018-07-14 LAB — LIPASE, BLOOD: Lipase: 19 U/L (ref 11–51)

## 2018-07-14 LAB — CBC WITH DIFFERENTIAL/PLATELET
Abs Immature Granulocytes: 0.07 10*3/uL (ref 0.00–0.07)
Basophils Absolute: 0 10*3/uL (ref 0.0–0.1)
Basophils Relative: 0 %
Eosinophils Absolute: 0.2 10*3/uL (ref 0.0–0.5)
Eosinophils Relative: 2 %
HCT: 34.6 % — ABNORMAL LOW (ref 36.0–46.0)
Hemoglobin: 10.1 g/dL — ABNORMAL LOW (ref 12.0–15.0)
Immature Granulocytes: 1 %
Lymphocytes Relative: 21 %
Lymphs Abs: 2 10*3/uL (ref 0.7–4.0)
MCH: 25.2 pg — ABNORMAL LOW (ref 26.0–34.0)
MCHC: 29.2 g/dL — ABNORMAL LOW (ref 30.0–36.0)
MCV: 86.3 fL (ref 80.0–100.0)
Monocytes Absolute: 0.8 10*3/uL (ref 0.1–1.0)
Monocytes Relative: 8 %
Neutro Abs: 6.6 10*3/uL (ref 1.7–7.7)
Neutrophils Relative %: 68 %
Platelets: 348 10*3/uL (ref 150–400)
RBC: 4.01 MIL/uL (ref 3.87–5.11)
RDW: 17.2 % — ABNORMAL HIGH (ref 11.5–15.5)
WBC: 9.6 10*3/uL (ref 4.0–10.5)
nRBC: 0 % (ref 0.0–0.2)

## 2018-07-14 LAB — COMPREHENSIVE METABOLIC PANEL
ALT: 15 U/L (ref 0–44)
AST: 23 U/L (ref 15–41)
Albumin: 3 g/dL — ABNORMAL LOW (ref 3.5–5.0)
Alkaline Phosphatase: 78 U/L (ref 38–126)
Anion gap: 9 (ref 5–15)
BUN: 8 mg/dL (ref 8–23)
CO2: 23 mmol/L (ref 22–32)
Calcium: 8.2 mg/dL — ABNORMAL LOW (ref 8.9–10.3)
Chloride: 99 mmol/L (ref 98–111)
Creatinine, Ser: 1.13 mg/dL — ABNORMAL HIGH (ref 0.44–1.00)
GFR calc Af Amer: 53 mL/min — ABNORMAL LOW (ref >60)
GFR calc non Af Amer: 46 mL/min — ABNORMAL LOW (ref >60)
Glucose, Bld: 106 mg/dL — ABNORMAL HIGH (ref 70–99)
Potassium: 4.1 mmol/L (ref 3.5–5.1)
Sodium: 131 mmol/L — ABNORMAL LOW (ref 135–145)
Total Bilirubin: 0.6 mg/dL (ref 0.3–1.2)
Total Protein: 6.2 g/dL — ABNORMAL LOW (ref 6.5–8.1)

## 2018-07-14 LAB — LACTIC ACID, PLASMA
Lactic Acid, Venous: 1.6 mmol/L (ref 0.5–1.9)
Lactic Acid, Venous: 1 mmol/L (ref 0.5–1.9)

## 2018-07-14 LAB — TROPONIN I
Troponin I: 0.04 ng/mL (ref <0.03)
Troponin I: 0.03 ng/mL (ref <0.03)

## 2018-07-14 LAB — BRAIN NATRIURETIC PEPTIDE: B Natriuretic Peptide: 434 pg/mL — ABNORMAL HIGH (ref 0.0–100.0)

## 2018-07-14 MED ORDER — HYDRALAZINE HCL 50 MG PO TABS
50.0000 mg | ORAL_TABLET | Freq: Three times a day (TID) | ORAL | Status: DC
  Administered 2018-07-14 – 2018-07-18 (×12): 50 mg via ORAL
  Filled 2018-07-14 (×12): qty 1

## 2018-07-14 MED ORDER — ROSUVASTATIN CALCIUM 5 MG PO TABS
10.0000 mg | ORAL_TABLET | Freq: Every day | ORAL | Status: DC
  Administered 2018-07-15 – 2018-07-18 (×4): 10 mg via ORAL
  Filled 2018-07-14 (×4): qty 2

## 2018-07-14 MED ORDER — LOSARTAN POTASSIUM 50 MG PO TABS
100.0000 mg | ORAL_TABLET | Freq: Every day | ORAL | Status: DC
  Administered 2018-07-15 – 2018-07-18 (×4): 100 mg via ORAL
  Filled 2018-07-14 (×4): qty 2

## 2018-07-14 MED ORDER — ZOLPIDEM TARTRATE 5 MG PO TABS
5.0000 mg | ORAL_TABLET | Freq: Every day | ORAL | Status: DC
  Administered 2018-07-14 – 2018-07-17 (×4): 5 mg via ORAL
  Filled 2018-07-14 (×5): qty 1

## 2018-07-14 MED ORDER — ONDANSETRON HCL 4 MG/2ML IJ SOLN
4.0000 mg | Freq: Four times a day (QID) | INTRAMUSCULAR | Status: DC | PRN
  Administered 2018-07-15 – 2018-07-18 (×4): 4 mg via INTRAVENOUS
  Filled 2018-07-14 (×5): qty 2

## 2018-07-14 MED ORDER — SODIUM CHLORIDE 0.9 % IV SOLN
2.0000 g | INTRAVENOUS | Status: DC
  Administered 2018-07-14 – 2018-07-17 (×4): 2 g via INTRAVENOUS
  Filled 2018-07-14 (×6): qty 2

## 2018-07-14 MED ORDER — FLUTICASONE PROPIONATE 50 MCG/ACT NA SUSP
2.0000 | Freq: Every day | NASAL | Status: DC
  Administered 2018-07-15 – 2018-07-18 (×4): 2 via NASAL
  Filled 2018-07-14: qty 16

## 2018-07-14 MED ORDER — VANCOMYCIN HCL 10 G IV SOLR
1500.0000 mg | Freq: Once | INTRAVENOUS | Status: AC
  Administered 2018-07-14: 1500 mg via INTRAVENOUS
  Filled 2018-07-14 (×2): qty 1500

## 2018-07-14 MED ORDER — AMIODARONE HCL 200 MG PO TABS
200.0000 mg | ORAL_TABLET | Freq: Every day | ORAL | Status: DC
  Administered 2018-07-15 – 2018-07-18 (×4): 200 mg via ORAL
  Filled 2018-07-14 (×4): qty 1

## 2018-07-14 MED ORDER — MONTELUKAST SODIUM 10 MG PO TABS
10.0000 mg | ORAL_TABLET | Freq: Every day | ORAL | Status: DC
  Administered 2018-07-14 – 2018-07-17 (×4): 10 mg via ORAL
  Filled 2018-07-14 (×4): qty 1

## 2018-07-14 MED ORDER — CITALOPRAM HYDROBROMIDE 40 MG PO TABS
40.0000 mg | ORAL_TABLET | Freq: Every day | ORAL | Status: DC
  Administered 2018-07-15 – 2018-07-18 (×4): 40 mg via ORAL
  Filled 2018-07-14 (×2): qty 1
  Filled 2018-07-14: qty 2
  Filled 2018-07-14: qty 1

## 2018-07-14 MED ORDER — ONDANSETRON HCL 4 MG PO TABS
4.0000 mg | ORAL_TABLET | Freq: Four times a day (QID) | ORAL | Status: DC | PRN
  Administered 2018-07-16 (×2): 4 mg via ORAL
  Filled 2018-07-14 (×2): qty 1

## 2018-07-14 MED ORDER — METOPROLOL SUCCINATE ER 25 MG PO TB24
12.5000 mg | ORAL_TABLET | Freq: Every day | ORAL | Status: DC
  Administered 2018-07-15 – 2018-07-18 (×4): 12.5 mg via ORAL
  Filled 2018-07-14 (×4): qty 1

## 2018-07-14 MED ORDER — ACETAMINOPHEN 325 MG PO TABS: 650.0000 mg | ORAL_TABLET | Freq: Four times a day (QID) | ORAL | Status: DC | PRN

## 2018-07-14 MED ORDER — VANCOMYCIN HCL 10 G IV SOLR
1250.0000 mg | INTRAVENOUS | Status: DC
  Administered 2018-07-15 – 2018-07-17 (×3): 1250 mg via INTRAVENOUS
  Filled 2018-07-14 (×5): qty 1250

## 2018-07-14 MED ORDER — POLYETHYLENE GLYCOL 3350 17 G PO PACK
17.0000 g | PACK | Freq: Every day | ORAL | Status: DC | PRN
  Administered 2018-07-16: 17 g via ORAL
  Filled 2018-07-14: qty 1

## 2018-07-14 MED ORDER — ISOSORBIDE MONONITRATE ER 30 MG PO TB24
30.0000 mg | ORAL_TABLET | Freq: Every morning | ORAL | Status: DC
  Administered 2018-07-15 – 2018-07-18 (×4): 30 mg via ORAL
  Filled 2018-07-14 (×4): qty 1

## 2018-07-14 MED ORDER — LEVOTHYROXINE SODIUM 50 MCG PO TABS
50.0000 ug | ORAL_TABLET | Freq: Every day | ORAL | Status: DC
  Administered 2018-07-15 – 2018-07-18 (×4): 50 ug via ORAL
  Filled 2018-07-14 (×4): qty 1

## 2018-07-14 MED ORDER — ACETAMINOPHEN 650 MG RE SUPP: 650.0000 mg | Freq: Four times a day (QID) | RECTAL | Status: DC | PRN

## 2018-07-14 MED ORDER — PANTOPRAZOLE SODIUM 40 MG PO TBEC
40.0000 mg | DELAYED_RELEASE_TABLET | Freq: Every day | ORAL | Status: DC
  Administered 2018-07-15 – 2018-07-18 (×4): 40 mg via ORAL
  Filled 2018-07-14 (×4): qty 1

## 2018-07-14 NOTE — Telephone Encounter (Signed)
TC w/ [place] nurse w/ [facility] @ home and with patient Pt's is still having a hard time breathing, sat's at 93 while sitting and dropping to the 80s when standing, also dry heaving and has a clear productive cough, she feels worse than last Thursday Last Thursday, she did not go to the ED, the EMS gave her O2 and discouraged her from going to ED d/t COVID-19 In talking w/ Particia Nearing, encouraged patient to go to [facility]. Informed her that all Covid-19 patients within the [facility] system are going to either [facility] or the [facility]. She is agreeing to go

## 2018-07-14 NOTE — ED Triage Notes (Signed)
Pt with generalized weakness for a week, cough for a month.  sats decreased with home health PT.

## 2018-07-14 NOTE — Progress Notes (Signed)
Pt arrived to 2w-23 via [facility] transport.  Upon arrival pt transferred to bed, assessment completed.  Pt alert, oriented, in no acute distress.  Up to [facility] to void.  No skin breakdown noted.  BP reported to provider.  PM meds to be administered.  Pt on 2L [place].  Tele monitor on.  Denies pain.  Belongings at bedside.  Reviewed call bell, asking for help to get up, TV, lights.

## 2018-07-14 NOTE — ED Notes (Signed)
CRITICAL VALUE ALERT  Critical Value:  Trop 0.04  Date & Time Notied:  07/14/18 1601  Provider Notified: Thurnell Garbe  Orders Received/Actions taken: na

## 2018-07-14 NOTE — ED Provider Notes (Signed)
[facility] EMERGENCY DEPARTMENT Provider Note   CSN: [id number] Arrival date & time: 07/14/18  1328    History   Chief Complaint Chief Complaint  Patient presents with   Weakness    x 1 week    HPI [name] is a 84 y.o. female.     HPI Pt was seen at 1425. Per pt, c/o gradual onset and worsening of persistent SOB for the past 1 month, worse over the past few weeks. Pt's home health RN called pt's PMD and told them pt's "resting oxygen level was 93 and when pt starts to stand or walk her oxygen drops to 82." Pt is not on oxygen at baseline. Has been associated with cough and "dry heaving." Denies fevers, COVID+ exposure. Denies CP/palpitations, no abd pain, no N/V/D, no back pain, no pedal edema.    Past Medical History:  Diagnosis Date   Anemia due to blood loss, chronic    Anxiety state, unspecified    Atrophy of left kidney    Cancer ([place])    melanoma on back   Cancer of right colon ([place]) 05/20/2017   CKD (chronic kidney disease), stage III [facility])      sees Dr. Lowanda Foster    Colon cancer [facility]) 2019   stage 3   Colostomy in place [facility])    in 2016   Coronary artery disease cardiologist-  dr Bronson Ing   a. Moderate to severe coronary disease involving the left anterior descending artery and right coronary artery.  Sequential stenosis in the LAD is significant.  Right coronary artery is moderate to severely likely nonischemic. Questionable small LVOT obstruction.  No Brockenbrough maneuver was performed.  Catheterization January 2013 w/ PTCA and DES x1 to pLAD b. 05/2014 MV no isch/infarct, EF 60%.   Depressive disorder, not elsewhere classified    Diastolic CHF, chronic [facility])    cardiologist-  dr Bronson Ing   pt. denies at preop. 06/07/18 - EF 60-65% on echo, no obvious dysfunction appreciated   Diversion colitis 2016   Epidural abscess L5- S1 03/01/2016   Gastro-esophageal reflux disease without esophagitis 07/16/2007   Overview:  Overview:  Qualifier:  Diagnosis of  By: Nelson-Smith CMA (AAMA), Dottie  Overview:  Overview:  Overview:  Qualifier: Diagnosis of  By: Nelson-Smith CMA (AAMA), Dottie    GERD (gastroesophageal reflux disease)    H/O hiatal hernia    History of [name]'s esophagus    History of melanoma excision    back   Hypertension    Hypothyroidism    Infection of spine ([place])    in hospial   Macular degeneration    both eyes   PAF (paroxysmal atrial fibrillation) ([place])    Parastomal hernia    Pneumonia    PONV (postoperative nausea and vomiting)    Pulmonary hypertension (HCC)    PA systolic [id number] mmHg by echocardiogram 06-17-2015  PA pressure 05-08-2011 by cardiac catheterization -- no sig. pulm. htn)PA saturation 62% thermodilution cardiac index 2.0 thick cardiac index 2.4   Pure hypercholesterolemia    S/P drug eluting coronary stent placement 05/08/2011   x1 DES to proximal LAD   Symptomatic cholelithiasis 07/18/2016   Tubulovillous adenoma of colon    Wears dentures    lower   Wears glasses     Patient Active Problem List   Diagnosis Date Noted   Weakness generalized 06/14/2018   Renal failure (ARF), acute on chronic ([place]) 06/14/2018   Non-STEMI (non-ST elevated myocardial infarction) ([place]) 06/11/2018   Current  use of long term anticoagulation 06/11/2018   Incarcerated recurrent hiatal hernia s/p robotic repair 06/05/2018 06/05/2018   GERD (gastroesophageal reflux disease) s/p [place] fundoplication (partial posterior wrap) 06/05/2018   S/P robotic partial posterior ([place]) fundoplication 08/02/6642 03/47/4259   Pulmonary hypertension ([place])    Hypothyroidism    History of [name]'s esophagus    Anxiety state    Acquired hypothyroidism 01/30/2018   Cancer of right colon ([place]) 05/20/2017   Irritable bowel syndrome with constipation 10/15/2016   Iron deficiency 08/13/2016   Discitis of lumbar region L5- S1 03/01/2016   Chronic diastolic CHF (congestive heart failure)  ([place]) 03/01/2016   DDD (degenerative disc disease), lumbar 02/12/2016   History of GI bleed 12/18/2015   Dehydration 08/29/2015   Gastric volvulus 08/29/2015   Erosive esophagitis 08/29/2015   Atrial fibrillation with RVR ([place]) 08/09/2015   TIA (transient ischemic attack)    Pressure ulcer 06/17/2015   Essential hypertension    Colonic mass 05/02/2015   Anemia due to chronic blood loss 05/02/2015   Anxiety and depression 05/02/2015   CKD (chronic kidney disease) stage 3, GFR 30-59 ml/min ([place]) 03/03/2015   History of percutaneous coronary intervention    Coronary artery disease    HYPERCHOLESTEROLEMIA 07/16/2007   Major depressive disorder, single episode, unspecified 07/16/2007    Past Surgical History:  Procedure Laterality Date   CARDIAC CATHETERIZATION Left 03/05/2015   Procedure: CENTRAL LINE INSERTION;  Surgeon: Aviva Signs, MD;  Location: [facility];  Service: General;  Laterality: Left;   CATARACT EXTRACTION W/ INTRAOCULAR LENS  IMPLANT, BILATERAL  ~ 2002   CHOLECYSTECTOMY N/A 07/18/2016   Procedure: LAPAROSCOPIC CHOLECYSTECTOMY WITH INTRAOPERATIVE CHOLANGIOGRAM;  Surgeon: [name], MD;  Location: [facility];  Service: General;  Laterality: N/A;   COLECTOMY WITH COLOSTOMY CREATION/HARTMANN PROCEDURE N/A 03/05/2015   Procedure: PARTIAL COLECTOMY WITH COLOSTOMY CREATION/HARTMANN PROCEDURE;  Surgeon: Aviva Signs, MD;  Location: [facility];  Service: General;  Laterality: N/A;   COLON SURGERY     colostomy bag  LLQ   ESOPHAGEAL MANOMETRY N/A 06/06/2014   Procedure: ESOPHAGEAL MANOMETRY (EM);  Surgeon: Jerene Bears, MD;  Location: [facility];  Service: Gastroenterology;  Laterality: N/A;   ESOPHAGOGASTRODUODENOSCOPY (EGD) WITH PROPOFOL N/A 08/04/2015   Procedure: ESOPHAGOGASTRODUODENOSCOPY (EGD) WITH PROPOFOL;  Surgeon: Mauri Pole, MD;  Location: [facility];  Service: Endoscopy;  Laterality: N/A;   HERNIA REPAIR  2019   left side/ removed mass   IR  GENERIC HISTORICAL  03/04/2016   IR LUMBAR DISC ASPIRATION W/IMG GUIDE 03/04/2016 Luanne Bras, MD [facility]-INTERV RAD   LAPAROSCOPIC NISSEN FUNDOPLICATION N/A 5/63/8756   Procedure: LAPAROSCOPIC  LYSIS OF ADHESIONS, SEGMENTAL GASTRECTOMY, PARTIAL REDUCTION OF HERNIA;  Surgeon: [name], MD;  Location: [facility];  Service: General;  Laterality: N/A;   LAPAROSCOPIC PARTIAL COLECTOMY N/A 05/07/2017   Procedure: LAPAROSCOPIC  CONVERTED TO OPEN RIGHT COLECTOMY, PARASTOMAL HERNIA  REPAIR;  Surgeon: Leighton Ruff, MD;  Location: [facility];  Service: General;  Laterality: N/A;   NISSEN FUNDOPLICATION  4332   PERCUTANEOUS CORONARY STENT INTERVENTION (PCI-S) N/A 05/08/2011   Procedure: PERCUTANEOUS CORONARY STENT INTERVENTION (PCI-S);  Surgeon: Wellington Hampshire, MD;  Location: [facility] CATH LAB;  Service: Cardiovascular;  Laterality: N/A;   ROTATOR CUFF REPAIR  ~ 2001   right   SHOULDER ARTHROSCOPY  ~ 2004; 2005   left; "joint's wore out"   TONSILLECTOMY  ~ [place]     OB History   No obstetric history on file.  Home Medications    Prior to Admission medications   Medication Sig Start Date End Date Taking? Authorizing Provider  albuterol (PROVENTIL HFA;VENTOLIN HFA) 108 (90 Base) MCG/ACT inhaler Inhale 2 puffs into the lungs every 6 (six) hours as needed for wheezing or shortness of breath. 04/15/18  Yes Terald Sleeper, PA-C  ALPRAZolam (XANAX) 0.25 MG tablet TAKEK 1/2 TO 1 TABLET 3 TIMES DAILY AS NEEDED Patient taking differently: Take 0.125-0.25 mg by mouth 3 (three) times daily as needed for anxiety.  01/30/18  Yes Terald Sleeper, PA-C  amiodarone (PACERONE) 200 MG tablet Take 2 tablets (400 mg total) by mouth daily. Please take 400 mg total by mouth daily for 7 days (end date 06/24/18) Then 200 mg total by mouth daily for remaining days Patient taking differently: Take 200 mg by mouth daily.  06/17/18 07/15/2018 Yes Kayleen Memos, DO  apixaban (ELIQUIS) 5 MG TABS tablet  Take 1 tablet (5 mg total) by mouth 2 (two) times daily. 06/11/18  Yes Michael Boston, MD  citalopram (CELEXA) 40 MG tablet TAKE ONE (1) TABLET EACH DAY Patient taking differently: Take 40 mg by mouth daily.  01/30/18  Yes Terald Sleeper, PA-C  denosumab (PROLIA) 60 MG/ML SOSY injection Inject 60 mg into the skin every 6 (six) months. 02/18/18  Yes Terald Sleeper, PA-C  esomeprazole (NEXIUM) 40 MG capsule TAKE ONE (1) [place] Patient taking differently: Take 40 mg by mouth daily.  01/30/18  Yes Terald Sleeper, PA-C  ferrous sulfate 325 (65 FE) MG EC tablet Take 325 mg by mouth daily with breakfast.    Yes [provider]  hydrALAZINE (APRESOLINE) 50 MG tablet TAKE ONE (1) TABLET THREE (3) TIMES EACH DAY Patient taking differently: Take 50 mg by mouth 3 (three) times daily.  01/30/18  Yes Terald Sleeper, PA-C  HYDROcodone-homatropine (HYCODAN) 5-1.5 MG/5ML syrup Take 5 mLs by mouth every 8 (eight) hours as needed for cough. 06/29/18  Yes Terald Sleeper, PA-C  isosorbide mononitrate (IMDUR) 30 MG 24 hr tablet Take 1 tablet (30 mg total) by mouth every morning. 01/30/18  Yes Terald Sleeper, PA-C  levothyroxine (SYNTHROID, LEVOTHROID) 50 MCG tablet TAKE ONE TABLET EACH MORNING BEFORE BREAKFAST Patient taking differently: Take 50 mcg by mouth daily before breakfast.  01/30/18  Yes Terald Sleeper, PA-C  loratadine (CLARITIN) 10 MG tablet Take 1 tablet (10 mg total) by mouth at bedtime. 01/30/18  Yes Terald Sleeper, PA-C  losartan (COZAAR) 100 MG tablet TAKE ONE (1) TABLET EACH DAY Patient taking differently: Take 100 mg by mouth daily.  01/30/18  Yes Terald Sleeper, PA-C  Meclizine HCl 25 MG CHEW TAKE 1 TABLET 3 TIMES DAILY AS NEEDED FOR DIZZINESS Patient taking differently: Chew 25 mg by mouth 3 (three) times daily as needed (dizziness).  10/15/17  Yes Terald Sleeper, PA-C  metoprolol succinate (TOPROL-XL) 25 MG 24 hr tablet Take 0.5 tablets (12.5 mg total) by mouth daily. 05/05/18  Yes Terald Sleeper, PA-C  montelukast (SINGULAIR) 10 MG tablet Take 1 tablet (10 mg total) by mouth at bedtime. 01/30/18  Yes Terald Sleeper, PA-C  nitroGLYCERIN (NITROSTAT) 0.4 MG SL tablet Place 1 tablet (0.4 mg total) under the tongue every 5 (five) minutes as needed for chest pain. Reported on 04/26/2015 01/13/17  Yes Herminio Commons, MD  nystatin (MYCOSTATIN/NYSTOP) powder Apply topically 4 (four) times daily. Patient taking differently: Apply 1 Bottle topically as needed (yeast).  05/05/18  Yes Particia Nearing S, PA-C  ondansetron (ZOFRAN-ODT) 4 MG disintegrating tablet Take 1 tablet (4 mg total) by mouth every 6 (six) hours as needed for nausea or vomiting. 06/11/18  Yes Gross, Remo Lipps, MD  polyethylene glycol powder (GAVILAX) powder MIX 17 GRAMS IN 80Z OF WATER/JUICE AND DRINK TWICE DAILY Patient taking differently: Take 17 g by mouth daily.  10/15/17  Yes Terald Sleeper, PA-C  Propylene Glycol (SYSTANE BALANCE OP) Apply 1 drop to eye daily as needed (dry eyes).   Yes [provider]  rosuvastatin (CRESTOR) 10 MG tablet TAKE ONE (1) TABLET EACH DAY Patient taking differently: Take 10 mg by mouth daily.  01/30/18  Yes Terald Sleeper, PA-C  traMADol (ULTRAM) 50 MG tablet Take 1 tablet (50 mg total) by mouth every 12 (twelve) hours as needed. Patient taking differently: Take 50 mg by mouth daily as needed for moderate pain.  06/12/17  Yes Alla Feeling, NP  Vitamin D, Ergocalciferol, (DRISDOL) 50000 units CAPS capsule TAKE 1 CAPSULE ON TUESDAYS Patient taking differently: Take 50,000 Units by mouth every Tuesday.  09/11/17  Yes Terald Sleeper, PA-C  zolpidem (AMBIEN) 10 MG tablet Take 10 mg by mouth at bedtime.   Yes [provider]  benzonatate (TESSALON) 200 MG capsule Take 1 capsule (200 mg total) by mouth 2 (two) times daily as needed for cough. Patient not taking: Reported on 07/14/2018 06/18/18   Terald Sleeper, PA-C  fluticasone (FLONASE) 50 MCG/ACT nasal spray USE 2 SPRAYS IN EACH  NOSTRIL DAILY Patient taking differently: Place 2 sprays into both nostrils daily.  10/15/17   Terald Sleeper, PA-C    Family History Family History  Problem Relation Age of Onset   Heart attack Father    Hypertension Father    Hypertension Mother    Skin cancer Brother        melanoma   Atrial fibrillation Sister    Skin cancer Sister    Colon cancer Neg Hx    Esophageal cancer Neg Hx    Rectal cancer Neg Hx    Stomach cancer Neg Hx     Social History Social History   Tobacco Use   Smoking status: Former Smoker    Packs/day: 1.00    Years: 20.00    Pack years: 20.00    Types: Cigarettes    Start date: 10/12/1958    Last attempt to quit: 04/26/1988    Years since quitting: 30.2   Smokeless tobacco: Never Used  Substance Use Topics   Alcohol use: No    Alcohol/week: 0.0 standard drinks   Drug use: No     Allergies   Sulfamethoxazole   Review of Systems Review of Systems ROS: Statement: All systems negative except as marked or noted in the HPI; Constitutional: Negative for fever and chills. ; ; Eyes: Negative for eye pain, redness and discharge. ; ; ENMT: Negative for ear pain, hoarseness, nasal congestion, sinus pressure and sore throat. ; ; Cardiovascular: Negative for chest pain, palpitations, diaphoresis, and peripheral edema. ; ; Respiratory: +cough, SOB. Negative for wheezing and stridor. ; ; Gastrointestinal: Negative for nausea, vomiting, diarrhea, abdominal pain, blood in stool, hematemesis, jaundice and rectal bleeding. . ; ; Genitourinary: Negative for dysuria, flank pain and hematuria. ; ; Musculoskeletal: Negative for back pain and neck pain. Negative for swelling and trauma.; ; Skin: Negative for pruritus, rash, abrasions, blisters, bruising and skin lesion.; ; Neuro: Negative for headache, lightheadedness and neck stiffness. Negative for weakness,  altered level of consciousness, altered mental status, extremity weakness, paresthesias, involuntary  movement, seizure and syncope.       Physical Exam Updated Vital Signs BP (!) 168/98    Pulse 79    Temp 98.6 F (37 C) (Oral)    Resp (!) 24    Ht 5' 2.5" (1.588 m)    Wt 73 kg    SpO2 98%    BMI 28.98 kg/m   Patient Vitals for the past 24 hrs:  BP Temp Temp src Pulse Resp SpO2 Height Weight  07/14/18 1600 -- -- -- 79 (!) 24 98 % -- --  07/14/18 1515 -- -- -- 78 (!) 22 98 % -- --  07/14/18 1500 -- -- -- 79 (!) 23 99 % -- --  07/14/18 1451 -- -- -- 83 (!) 27 97 % -- --  07/14/18 1448 -- -- -- 84 20 91 % -- --  07/14/18 1358 -- -- -- 80 (!) 23 94 % -- --  07/14/18 1335 (!) 168/98 98.6 F (37 C) Oral 81 (!) 22 94 % -- --  07/14/18 1331 -- -- -- -- -- -- 5' 2.5" (1.588 m) 73 kg     14:44 Orthostatic Vital Signs TH  Orthostatic Lying   BP- Lying: 159/84  Pulse- Lying: 80      Orthostatic Sitting  BP- Sitting: 161/84  Pulse- Sitting: 81      Orthostatic Standing at 0 minutes  BP- Standing at 0 minutes: 147/77  Pulse- Standing at 0 minutes: 84     Physical Exam 1430: Physical examination:  Nursing notes reviewed; Vital signs and O2 SAT reviewed;  Constitutional: Well developed, Well nourished, Well hydrated, In no acute distress; Head:  Normocephalic, atraumatic; Eyes: EOMI, PERRL, No scleral icterus; ENMT: Mouth and pharynx normal, Mucous membranes moist; Neck: Supple, Full range of motion, No lymphadenopathy; Cardiovascular: Regular rate and rhythm, No gallop; Respiratory: Breath sounds diminished & equal bilaterally, No wheezes.  Speaking full sentences with ease, Normal respiratory effort/excursion. Mild tachypnea.; Chest: Nontender, Movement normal; Abdomen: Soft, Nontender, Nondistended, Normal bowel sounds; Genitourinary: No CVA tenderness; Extremities: Peripheral pulses normal, No tenderness, No edema, No calf edema or asymmetry.; Neuro: AA&Ox3, Major CN grossly intact.  Speech clear. No gross focal motor or sensory deficits in extremities.; Skin: Color normal, Warm,  Dry.    ED Treatments / Results  Labs (all labs ordered are listed, but only abnormal results are displayed)   EKG EKG Interpretation  Date/Time:  Tuesday July 14 2018 13:35:20 EDT Ventricular Rate:  82 PR Interval:    QRS Duration: 117 QT Interval:  487 QTC Calculation: 566 R Axis:   70 Text Interpretation:  Sinus rhythm Short PR interval Incomplete left bundle branch block Low voltage, extremity and precordial leads Baseline wander Artifact When compared with ECG of 06/16/2018 No significant change was found Confirmed by Francine Graven [phone]) on 07/14/2018 2:50:36 PM   Radiology   Procedures Procedures (including critical care time)  Medications Ordered in ED Medications - No data to display   Initial Impression / Assessment and Plan / ED Course  I have reviewed the triage vital signs and the nursing notes.  Pertinent labs & imaging results that were available during my care of the patient were reviewed by me and considered in my medical decision making (see chart for details).     MDM Reviewed: previous chart, nursing note and vitals Reviewed previous: labs and ECG Interpretation: labs, ECG and x-Jaleisa   Results for  orders placed or performed during the hospital encounter of 07/14/18  Brain natriuretic peptide  Result Value Ref Range   B Natriuretic Peptide 434.0 (H) 0.0 - 100.0 pg/mL  Troponin I - Once  Result Value Ref Range   Troponin I 0.04 (HH) <0.03 ng/mL  Lactic acid, plasma  Result Value Ref Range   Lactic Acid, Venous 1.6 0.5 - 1.9 mmol/L  Lactic acid, plasma  Result Value Ref Range   Lactic Acid, Venous 1.0 0.5 - 1.9 mmol/L  CBC with Differential  Result Value Ref Range   WBC 9.6 4.0 - 10.5 K/uL   RBC 4.01 3.87 - 5.11 MIL/uL   Hemoglobin 10.1 (L) 12.0 - 15.0 g/dL   HCT 34.6 (L) 36.0 - 46.0 %   MCV 86.3 80.0 - 100.0 fL   MCH 25.2 (L) 26.0 - 34.0 pg   MCHC 29.2 (L) 30.0 - 36.0 g/dL   RDW 17.2 (H) 11.5 - 15.5 %   Platelets 348 150 - 400  K/uL   nRBC 0.0 0.0 - 0.2 %   Neutrophils Relative % 68 %   Neutro Abs 6.6 1.7 - 7.7 K/uL   Lymphocytes Relative 21 %   Lymphs Abs 2.0 0.7 - 4.0 K/uL   Monocytes Relative 8 %   Monocytes Absolute 0.8 0.1 - 1.0 K/uL   Eosinophils Relative 2 %   Eosinophils Absolute 0.2 0.0 - 0.5 K/uL   Basophils Relative 0 %   Basophils Absolute 0.0 0.0 - 0.1 K/uL   Immature Granulocytes 1 %   Abs Immature Granulocytes 0.07 0.00 - 0.07 K/uL  Comprehensive metabolic panel  Result Value Ref Range   Sodium 131 (L) 135 - 145 mmol/L   Potassium 4.1 3.5 - 5.1 mmol/L   Chloride 99 98 - 111 mmol/L   CO2 23 22 - 32 mmol/L   Glucose, Bld 106 (H) 70 - 99 mg/dL   BUN 8 8 - 23 mg/dL   Creatinine, Ser 1.13 (H) 0.44 - 1.00 mg/dL   Calcium 8.2 (L) 8.9 - 10.3 mg/dL   Total Protein 6.2 (L) 6.5 - 8.1 g/dL   Albumin 3.0 (L) 3.5 - 5.0 g/dL   AST 23 15 - 41 U/L   ALT 15 0 - 44 U/L   Alkaline Phosphatase 78 38 - 126 U/L   Total Bilirubin 0.6 0.3 - 1.2 mg/dL   GFR calc non Af Amer 46 (L) >60 mL/min   GFR calc Af Amer 53 (L) >60 mL/min   Anion gap 9 5 - 15  Lipase, blood  Result Value Ref Range   Lipase 19 11 - 51 U/L   Dg Chest Portable 1 View Result Date: 07/14/2018 CLINICAL DATA:  Chest pain and shortness of breath. EXAM: PORTABLE CHEST 1 VIEW COMPARISON:  Chest x-Sueanne dated June 14, 2018. FINDINGS: Stable cardiomegaly. Normal pulmonary vascularity. Increasing now moderate right pleural effusion with right basilar opacity. Unchanged small left pleural effusion with mild left lower lobe subsegmental atelectasis. No pneumothorax. No acute osseous abnormality. IMPRESSION: 1. Enlarging now moderate right pleural effusion with adjacent right basilar atelectasis versus consolidation. 2. Unchanged small left pleural effusion. Electronically Signed   By: Titus Dubin M.D.   On: 07/14/2018 14:01    Results for Glahn, ALITHEA LAPAGE (MRN [id number]) as of 07/14/2018 17:15  Ref. Range 06/09/2018 03:24 06/14/2018 13:28 06/15/2018 05:43  06/16/2018 04:01 07/14/2018 14:49  Hemoglobin Latest Ref Range: 12.0 - 15.0 g/dL 10.7 (L) 9.1 (L) 8.8 (L) 8.6 (L) 10.1 (L)  HCT  Latest Ref Range: 36.0 - 46.0 % 32.8 (L) 30.7 (L) 28.3 (L) 27.7 (L) 34.6 (L)    Results for Dicaprio, BANNIE LOBBAN (MRN [id number]) as of 07/14/2018 17:15  Ref. Range 06/19/2015 05:55 06/14/2018 13:28 07/14/2018 15:11  B Natriuretic Peptide Latest Ref Range: 0.0 - 100.0 pg/mL 217.0 (H) 570.9 (H) 434.0 (H)   Results for Deharo, JO-ANNE KLUTH (MRN [id number]) as of 07/14/2018 17:15  Ref. Range 06/06/2018 23:39 06/08/2018 09:13 06/08/2018 14:12 06/08/2018 21:02 07/14/2018 14:49  Troponin I Latest Ref Range: <0.03 ng/mL 10.58 (HH) 5.01 (HH) 4.34 (HH) 4.39 (HH) 0.04 (HH)    1650:  Pt ambulated with O2 Sats dropping to 88% R/A, and pt c/o increasing SOB. H/H per baseline. Troponin decreasing from previous levels last month. BNP mildly elevated, but no overt CHF on CXR.  T/C returned from [facility] Denton Brick, Reger discussed, including:  HPI, pertinent PM/SHx, VS/PE, dx testing, ED course and treatment:  Agreeable to come to ED for evaluation for admission.       Final Clinical Impressions(s) / ED Diagnoses   Final diagnoses:  None    ED Discharge Orders    None       Francine Graven, DO 07/17/18 1555

## 2018-07-14 NOTE — Care Management (Signed)
Notified by Octavia Bruckner of [facility] that patient is active with them for RN, PT, and OT services.

## 2018-07-14 NOTE — ED Notes (Signed)
ED TO INPATIENT HANDOFF REPORT  ED Nurse Name and Phone #: Myiesha Edgar rn  S Name/Age/Gender [name] 84 y.o. female Room/Bed: APOTF/OTF  Code Status   Code Status: Prior  Home/SNF/Other Home Patient oriented to: self, place, time and situation Is this baseline? Yes   Triage Complete: Triage complete  Chief Complaint .  Triage Note Pt with generalized weakness for a week, cough for a month.  sats decreased with home health PT.    Allergies Allergies  Allergen Reactions  . Sulfamethoxazole Rash    Level of Care/Admitting Diagnosis ED Disposition    ED Disposition Condition [facility] Area: [place] [[id number]]  Level of Care: Telemetry Medical [104]  I expect the patient will be discharged within 24 hours: No (not a candidate for 5C-Observation unit)  Diagnosis: Pleural effusion [242230]  Admitting Physician: Bethena Roys [[id number]]  Attending Physician: Bethena Roys [id number]  Bed request comments: Low risk Co-Vid Rule out.  PT Class (Do Not Modify): Observation [104]  PT Acc Code (Do Not Modify): Observation [10022]       B Medical/Surgery History Past Medical History:  Diagnosis Date  . Anemia due to blood loss, chronic   . Anxiety state, unspecified   . Atrophy of left kidney   . Cancer ([place])    melanoma on back  . Cancer of right colon ([place]) 05/20/2017  . CKD (chronic kidney disease), stage III (HCC)      sees Dr. Lowanda Foster   . Colon cancer ([place]) 2019   stage 3  . Colostomy in place [facility])    in 2016  . Coronary artery disease cardiologist-  dr Bronson Ing   a. Moderate to severe coronary disease involving the left anterior descending artery and right coronary artery.  Sequential stenosis in the LAD is significant.  Right coronary artery is moderate to severely likely nonischemic. Questionable small LVOT obstruction.  No Brockenbrough maneuver was performed.  Catheterization January 2013 w/ PTCA and DES x1 to  pLAD b. 05/2014 MV no isch/infarct, EF 60%.  . Depressive disorder, not elsewhere classified   . Diastolic CHF, chronic [facility])    cardiologist-  dr Bronson Ing   pt. denies at preop. 06/07/18 - EF 60-65% on echo, no obvious dysfunction appreciated  . Diversion colitis 2016  . Epidural abscess L5- S1 03/01/2016  . Gastro-esophageal reflux disease without esophagitis 07/16/2007   Overview:  Overview:  Qualifier: Diagnosis of  By: Nelson-Smith CMA (AAMA), Dottie  Overview:  Overview:  Overview:  Qualifier: Diagnosis of  By: Nelson-Smith CMA (AAMA), Dottie   . GERD (gastroesophageal reflux disease)   . H/O hiatal hernia   . History of [name]'s esophagus   . History of melanoma excision    back  . Hypertension   . Hypothyroidism   . Infection of spine ([place])    in hospial  . Macular degeneration    both eyes  . PAF (paroxysmal atrial fibrillation) ([place])   . Parastomal hernia   . Pneumonia   . PONV (postoperative nausea and vomiting)   . Pulmonary hypertension ([place])    PA systolic [id number] mmHg by echocardiogram 06-17-2015  PA pressure 05-08-2011 by cardiac catheterization -- no sig. pulm. htn)PA saturation 62% thermodilution cardiac index 2.0 thick cardiac index 2.4  . Pure hypercholesterolemia   . S/P drug eluting coronary stent placement 05/08/2011   x1 DES to proximal LAD  . Symptomatic cholelithiasis 07/18/2016  . Tubulovillous adenoma of colon   .  Wears dentures    lower  . Wears glasses    Past Surgical History:  Procedure Laterality Date  . CARDIAC CATHETERIZATION Left 03/05/2015   Procedure: CENTRAL LINE INSERTION;  Surgeon: Aviva Signs, MD;  Location: [facility];  Service: General;  Laterality: Left;  . CATARACT EXTRACTION W/ INTRAOCULAR LENS  IMPLANT, BILATERAL  ~ 2002  . CHOLECYSTECTOMY N/A 07/18/2016   Procedure: LAPAROSCOPIC CHOLECYSTECTOMY WITH INTRAOPERATIVE CHOLANGIOGRAM;  Surgeon: [name], MD;  Location: [facility];  Service: General;  Laterality: N/A;  . COLECTOMY WITH  COLOSTOMY CREATION/HARTMANN PROCEDURE N/A 03/05/2015   Procedure: PARTIAL COLECTOMY WITH COLOSTOMY CREATION/HARTMANN PROCEDURE;  Surgeon: Aviva Signs, MD;  Location: [facility];  Service: General;  Laterality: N/A;  . COLON SURGERY     colostomy bag  LLQ  . ESOPHAGEAL MANOMETRY N/A 06/06/2014   Procedure: ESOPHAGEAL MANOMETRY (EM);  Surgeon: Jerene Bears, MD;  Location: [facility];  Service: Gastroenterology;  Laterality: N/A;  . ESOPHAGOGASTRODUODENOSCOPY (EGD) WITH PROPOFOL N/A 08/04/2015   Procedure: ESOPHAGOGASTRODUODENOSCOPY (EGD) WITH PROPOFOL;  Surgeon: Mauri Pole, MD;  Location: [facility];  Service: Endoscopy;  Laterality: N/A;  . HERNIA REPAIR  2019   left side/ removed mass  . IR GENERIC HISTORICAL  03/04/2016   IR LUMBAR DISC ASPIRATION W/IMG GUIDE 03/04/2016 Luanne Bras, MD [facility]-INTERV RAD  . LAPAROSCOPIC NISSEN FUNDOPLICATION N/A 8/46/9629   Procedure: LAPAROSCOPIC  LYSIS OF ADHESIONS, SEGMENTAL GASTRECTOMY, PARTIAL REDUCTION OF HERNIA;  Surgeon: [name], MD;  Location: [facility];  Service: General;  Laterality: N/A;  . LAPAROSCOPIC PARTIAL COLECTOMY N/A 05/07/2017   Procedure: LAPAROSCOPIC  CONVERTED TO OPEN RIGHT COLECTOMY, PARASTOMAL HERNIA  REPAIR;  Surgeon: Leighton Ruff, MD;  Location: [facility];  Service: General;  Laterality: N/A;  . NISSEN FUNDOPLICATION  5284  . PERCUTANEOUS CORONARY STENT INTERVENTION (PCI-S) N/A 05/08/2011   Procedure: PERCUTANEOUS CORONARY STENT INTERVENTION (PCI-S);  Surgeon: Wellington Hampshire, MD;  Location: [facility] CATH LAB;  Service: Cardiovascular;  Laterality: N/A;  . ROTATOR CUFF REPAIR  ~ 2001   right  . SHOULDER ARTHROSCOPY  ~ 2004; 2005   left; "joint's wore out"  . TONSILLECTOMY  ~ 1944  . TUBAL LIGATION  1972     A IV Location/Drains/Wounds Patient Lines/Drains/Airways Status   Active Line/Drains/Airways    Name:   Placement date:   Placement time:   Site:   Days:   Peripheral IV 07/14/18 Right Antecubital   07/14/18    1451     Antecubital   less than 1   Colostomy LLQ   03/17/15    -    LLQ   1215   Incision (Closed) 06/05/18 Abdomen Other (Comment)   06/05/18    1348     39   Incision - 4 Ports Abdomen Right;Lower Mid;Upper Left;Mid;Medial Left;Lateral   06/05/18    0810     39          Intake/Output Last 24 hours No intake or output data in the 24 hours ending 07/14/18 2031  Labs/Imaging Results for orders placed or performed during the hospital encounter of 07/14/18 (from the past 48 hour(s))  Troponin I - Once     Status: Abnormal   Collection Time: 07/14/18  2:49 PM  Result Value Ref Range   Troponin I 0.04 (HH) <0.03 ng/mL    Comment: CRITICAL RESULT CALLED TO, READ BACK BY AND VERIFIED WITH: DANIELS,B ON 07/14/18 AT 1600 BY LOY,C Performed at [facility], [address] [place], [place] [address]  CBC with Differential     Status: Abnormal   Collection Time: 07/14/18  2:49 PM  Result Value Ref Range   WBC 9.6 4.0 - 10.5 K/uL   RBC 4.01 3.87 - 5.11 MIL/uL   Hemoglobin 10.1 (L) 12.0 - 15.0 g/dL   HCT 34.6 (L) 36.0 - 46.0 %   MCV 86.3 80.0 - 100.0 fL   MCH 25.2 (L) 26.0 - 34.0 pg   MCHC 29.2 (L) 30.0 - 36.0 g/dL   RDW 17.2 (H) 11.5 - 15.5 %   Platelets 348 150 - 400 K/uL   nRBC 0.0 0.0 - 0.2 %   Neutrophils Relative % 68 %   Neutro Abs 6.6 1.7 - 7.7 K/uL   Lymphocytes Relative 21 %   Lymphs Abs 2.0 0.7 - 4.0 K/uL   Monocytes Relative 8 %   Monocytes Absolute 0.8 0.1 - 1.0 K/uL   Eosinophils Relative 2 %   Eosinophils Absolute 0.2 0.0 - 0.5 K/uL   Basophils Relative 0 %   Basophils Absolute 0.0 0.0 - 0.1 K/uL   Immature Granulocytes 1 %   Abs Immature Granulocytes 0.07 0.00 - 0.07 K/uL    Comment: Performed at [facility], [address] [place], [place] [address]  Brain natriuretic peptide     Status: Abnormal   Collection Time: 07/14/18  3:11 PM  Result Value Ref Range   B Natriuretic Peptide 434.0 (H) 0.0 - 100.0 pg/mL    Comment: Performed at [facility], [address],  [place], [place] [address]  Lactic acid, plasma     Status: None   Collection Time: 07/14/18  3:12 PM  Result Value Ref Range   Lactic Acid, Venous 1.6 0.5 - 1.9 mmol/L    Comment: Performed at [facility], [address], [place], [place] [address]  Comprehensive metabolic panel     Status: Abnormal   Collection Time: 07/14/18  3:12 PM  Result Value Ref Range   Sodium 131 (L) 135 - 145 mmol/L   Potassium 4.1 3.5 - 5.1 mmol/L   Chloride 99 98 - 111 mmol/L   CO2 23 22 - 32 mmol/L   Glucose, Bld 106 (H) 70 - 99 mg/dL   BUN 8 8 - 23 mg/dL   Creatinine, Ser 1.13 (H) 0.44 - 1.00 mg/dL   Calcium 8.2 (L) 8.9 - 10.3 mg/dL   Total Protein 6.2 (L) 6.5 - 8.1 g/dL   Albumin 3.0 (L) 3.5 - 5.0 g/dL   AST 23 15 - 41 U/L   ALT 15 0 - 44 U/L   Alkaline Phosphatase 78 38 - 126 U/L   Total Bilirubin 0.6 0.3 - 1.2 mg/dL   GFR calc non Af Amer 46 (L) >60 mL/min   GFR calc Af Amer 53 (L) >60 mL/min   Anion gap 9 5 - 15    Comment: Performed at [facility], [address] [place], [place] [address]  Lipase, blood     Status: None   Collection Time: 07/14/18  3:12 PM  Result Value Ref Range   Lipase 19 11 - 51 U/L    Comment: Performed at [facility], [address] [place], [place] [address]  Lactic acid, plasma     Status: None   Collection Time: 07/14/18  4:21 PM  Result Value Ref Range   Lactic Acid, Venous 1.0 0.5 - 1.9 mmol/L    Comment: Performed at [facility], [address], [place], [place] [address]   Dg Chest Portable  1 View  Result Date: 07/14/2018 CLINICAL DATA:  Chest pain and shortness of breath. EXAM: PORTABLE CHEST 1 VIEW COMPARISON:  Chest x-Zeanna dated June 14, 2018. FINDINGS: Stable cardiomegaly. Normal pulmonary vascularity. Increasing now moderate right pleural effusion with right basilar opacity. Unchanged small left pleural effusion with mild left lower lobe subsegmental atelectasis. No pneumothorax. No acute osseous abnormality. IMPRESSION: 1. Enlarging now moderate right  pleural effusion with adjacent right basilar atelectasis versus consolidation. 2. Unchanged small left pleural effusion. Electronically Signed   By: Titus Dubin M.D.   On: 07/14/2018 14:01    Pending Labs Unresulted Labs (From admission, onward)    Start     Ordered   07/15/18 0500  Creatinine, serum  Every Mon-Wed-Fri (0500),   R     07/14/18 1816   07/14/18 2000  Troponin I - Now Then Q6H  Now then every 6 hours,   R     07/14/18 1753   07/14/18 1755  SARS Coronavirus 2 [facility] order, Performed in [facility] hospital lab)  (Novel Coronavirus, [facility] Order) with precautions panel)  Once,   R     07/14/18 1754   07/14/18 1745  Culture, blood (routine x 2)  BLOOD CULTURE X 2,   R     07/14/18 1744   07/14/18 1429  Urinalysis, Routine w reflex microscopic  ONCE - STAT,   STAT     07/14/18 1428   Signed and Held  Basic metabolic panel  Tomorrow morning,   R     Signed and Held   Signed and Held  CBC  Tomorrow morning,   R     Signed and Held          Vitals/Pain Today's Vitals   07/14/18 1900 07/14/18 1922 07/14/18 1930 07/14/18 2000  BP:  (!) 156/83 (!) 169/91 (!) 168/91  Pulse: 80 80 80 80  Resp: (!) 29 (!) 24 (!) 24 (!) 21  Temp:      TempSrc:      SpO2: 100% 93% 98% 97%  Weight:      Height:      PainSc:        Isolation Precautions Droplet and Contact precautions  Medications Medications  ceFEPIme (MAXIPIME) 2 g in sodium chloride 0.9 % 100 mL IVPB (0 g Intravenous Stopped 07/14/18 1911)  vancomycin (VANCOCIN) 1,500 mg in sodium chloride 0.9 % 500 mL IVPB (1,500 mg Intravenous New Bag/Given 07/14/18 1913)  vancomycin (VANCOCIN) 1,250 mg in sodium chloride 0.9 % 250 mL IVPB (1,250 mg Intravenous Not Given 07/14/18 1912)    Mobility walks with person assist  Low fall risk   Focused Assessments    R Recommendations: See Admitting Provider Note  Report given to:   Additional Notes:

## 2018-07-14 NOTE — ED Notes (Signed)
Pt was dizzy and SOB while ambulating. O2 sats dropped down to 88%.

## 2018-07-14 NOTE — ED Notes (Signed)
[facility] to transport patient

## 2018-07-14 NOTE — ED Notes (Signed)
Attempted to call report, nurse busy

## 2018-07-14 NOTE — ED Notes (Signed)
Pt was informed that we need a urine sample. Pt states that she can not urinate at this time. 

## 2018-07-14 NOTE — ED Notes (Signed)
Nurse not available for report. Will provide ed handoff for nurse to read

## 2018-07-14 NOTE — Progress Notes (Signed)
Pharmacy Antibiotic Note  [name] is a 84 y.o. female admitted on 07/14/2018 with pneumonia.  Pharmacy has been consulted for Vancomycin and Cefepime dosing.  Plan: Vancomycin 1500 mg IV x 1 dose. Vancomycin 1250 mg IV every 24 hours.  Goal trough 15-20 mcg/mL.  Cefepime 2000 mg IV every 24 hours. Monitor labs, c/s, and vanco levels as indicated.  Height: 5' 2.5" (158.8 cm) Weight: 161 lb (73 kg) IBW/kg (Calculated) : 51.25  Temp (24hrs), Avg:98.6 F (37 C), Min:98.6 F (37 C), Max:98.6 F (37 C)  Recent Labs  Lab 07/14/18 1449 07/14/18 1512 07/14/18 1621  WBC 9.6  --   --   CREATININE  --  1.13*  --   LATICACIDVEN  --  1.6 1.0    Estimated Creatinine Clearance: 37 mL/min (A) (by C-G formula based on SCr of 1.13 mg/dL (H)).    Allergies  Allergen Reactions  . Sulfamethoxazole Rash    Antimicrobials this admission: Vanco 4/14 >>  Cefepime 4/14 >>   Dose adjustments this admission: Vanco/Cefepime  Microbiology results: 4/14 BCx: pending 4/14 COVID: pending      Thank you for allowing pharmacy to be a part of this patient's care.  Ramond Craver 07/14/2018 6:24 PM

## 2018-07-14 NOTE — H&P (Addendum)
History and Physical    [name] BPZ:[id number] DOB: 1934-05-14 DOA: 07/14/2018  PCP: Terald Sleeper, PA-C   Patient coming from: Home  I have personally briefly reviewed patient's old medical records in [place]  Chief Complaint: SOB, Cough  HPI: [name] is a 84 y.o. female with medical history significant for coronary artery disease, CKD 3, atrial fibrillation, diastolic CHF, pulmonary hypertension, hypothyroidism, right colon cancer.  Who presented to the ED with complaints of increasing difficulty breathing and cough productive of whitish sputum over the past month-since she was discharged from the hospital. She denies fever or chills, no sore throat, no myalgias, no sick contacts, she has been home over the past few weeks.  No leg swelling.  No chest pain. Patient's home health RN evaluated patient at home, compelled patient to come to the ED-as her difficulty breathing was worse O2 sats 92% while sitting dropped to 80s on standing. A week ago patient declined coming to the hospital due to fear of COVID infection, but per chat- Last Thursday, she did not go to the ED, the EMS gave her O2 and discouraged her from going to ED d/t COVID-19  Recent hospitalization 3/15 to 3/18- for generalized weakness thought type factorial, secondary to paroxysmal A. fib with RVR and AKI.  Discharged to home after patient is compliant SNF discharge.  Plans for TEE cardioversion on admission canceled as patient converted to sinus rhythm.  Hospitalization to 3/6- 06/11/2018-for incarcerated recurrent hiatal hernia, underwent robotic repair 06/05/2018.  Stay complicated by postop A. Fib, marked elevated troponin, 13.58, asymptomatic, evaluated by cardiology, did not feel compelled to pursue invasive cardiac cath.  Started on amiodarone and Eliquis.    ED Course: Tachypneic to 27, O2 sats at the time of evaluation 90% on room air.  With ambulation in the ED O2 sats dropped to 88%. WBC 9.6, without  lymphopenia.  Mild lactic acid 1.  Sodium 131. Cr-1.13.  Stable chest x-Alexiana showed enlarging now moderate right pleural effusion with adjacent right basilar atelectasis versus consolidation, unchanged small left pleural effusion.  Hospitalist called to admit for pleural effusion.  Review of Systems: As per HPI all other systems reviewed and negative.  Past Medical History:  Diagnosis Date   Anemia due to blood loss, chronic    Anxiety state, unspecified    Atrophy of left kidney    Cancer ([place])    melanoma on back   Cancer of right colon ([place]) 05/20/2017   CKD (chronic kidney disease), stage III [facility])      sees Dr. Lowanda Foster    Colon cancer [facility]) 2019   stage 3   Colostomy in place [facility])    in 2016   Coronary artery disease cardiologist-  dr Bronson Ing   a. Moderate to severe coronary disease involving the left anterior descending artery and right coronary artery.  Sequential stenosis in the LAD is significant.  Right coronary artery is moderate to severely likely nonischemic. Questionable small LVOT obstruction.  No Brockenbrough maneuver was performed.  Catheterization January 2013 w/ PTCA and DES x1 to pLAD b. 05/2014 MV no isch/infarct, EF 60%.   Depressive disorder, not elsewhere classified    Diastolic CHF, chronic [facility])    cardiologist-  dr Bronson Ing   pt. denies at preop. 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 pulm. htn)PA saturation 62% thermodilution cardiac index 2.0 thick cardiac index 2.4   Pure hypercholesterolemia    S/P drug eluting coronary stent placement 05/08/2011   x1 DES to proximal LAD   Symptomatic cholelithiasis 07/18/2016   Tubulovillous adenoma of colon    Wears dentures    lower   Wears glasses     Past Surgical History:  Procedure Laterality Date   CARDIAC CATHETERIZATION Left 03/05/2015   Procedure: CENTRAL LINE INSERTION;  Surgeon: Aviva Signs, MD;  Location: [facility];  Service: General;  Laterality: Left;   CATARACT EXTRACTION W/ INTRAOCULAR LENS  IMPLANT, BILATERAL  ~ 2002   CHOLECYSTECTOMY N/A 07/18/2016   Procedure: LAPAROSCOPIC CHOLECYSTECTOMY WITH INTRAOPERATIVE CHOLANGIOGRAM;  Surgeon: [name], MD;  Location: [facility];  Service: General;  Laterality: N/A;   COLECTOMY WITH COLOSTOMY CREATION/HARTMANN PROCEDURE N/A 03/05/2015   Procedure: PARTIAL COLECTOMY WITH COLOSTOMY CREATION/HARTMANN PROCEDURE;  Surgeon: Aviva Signs, MD;  Location: [facility];  Service: General;  Laterality: N/A;   COLON SURGERY     colostomy bag  LLQ   ESOPHAGEAL MANOMETRY N/A 06/06/2014   Procedure: ESOPHAGEAL MANOMETRY (EM);  Surgeon: Jerene Bears, MD;  Location: [facility];  Service: Gastroenterology;  Laterality: N/A;   ESOPHAGOGASTRODUODENOSCOPY (EGD) WITH PROPOFOL N/A 08/04/2015   Procedure: ESOPHAGOGASTRODUODENOSCOPY (EGD) WITH PROPOFOL;  Surgeon: Mauri Pole, MD;  Location: [facility];  Service: Endoscopy;  Laterality: N/A;   HERNIA REPAIR  2019   left side/ removed mass   IR GENERIC HISTORICAL  03/04/2016   IR LUMBAR DISC ASPIRATION W/IMG GUIDE 03/04/2016  Luanne Bras, MD [facility]-INTERV RAD   LAPAROSCOPIC NISSEN FUNDOPLICATION N/A 2/99/2426   Procedure: LAPAROSCOPIC  LYSIS OF ADHESIONS, SEGMENTAL GASTRECTOMY, PARTIAL REDUCTION OF HERNIA;  Surgeon: [name], MD;  Location: [facility];  Service: General;  Laterality: N/A;   LAPAROSCOPIC PARTIAL COLECTOMY N/A 05/07/2017   Procedure: LAPAROSCOPIC  CONVERTED TO OPEN RIGHT COLECTOMY, PARASTOMAL HERNIA  REPAIR;  Surgeon: Leighton Ruff, MD;  Location: [facility];  Service: General;  Laterality: N/A;   NISSEN FUNDOPLICATION  8341   PERCUTANEOUS CORONARY STENT INTERVENTION (PCI-S) N/A 05/08/2011   Procedure: PERCUTANEOUS CORONARY STENT INTERVENTION (PCI-S);  Surgeon: Wellington Hampshire, MD;  Location: [facility] CATH LAB;  Service: Cardiovascular;  Laterality: N/A;   ROTATOR CUFF REPAIR  ~ 2001   right   SHOULDER ARTHROSCOPY  ~ 2004; 2005   left; "joint's wore out"   TONSILLECTOMY  ~ [place]     reports that she quit smoking about 30 years ago. Her smoking use included cigarettes. She started smoking about 59 years ago. She has a 20.00 pack-year smoking history. She has never used smokeless tobacco. She reports  that she does not drink alcohol or use drugs.  Allergies  Allergen Reactions   Sulfamethoxazole Rash    Family History  Problem Relation Age of Onset   Heart attack Father    Hypertension Father    Hypertension Mother    Skin cancer Brother        melanoma   Atrial fibrillation Sister    Skin cancer Sister    Colon cancer Neg Hx    Esophageal cancer Neg Hx    Rectal cancer Neg Hx    Stomach cancer Neg Hx     Prior to Admission medications   Medication Sig Start Date End Date Taking? Authorizing Provider  albuterol (PROVENTIL HFA;VENTOLIN HFA) 108 (90 Base) MCG/ACT inhaler Inhale 2 puffs into the lungs every 6 (six) hours as needed for wheezing or shortness of breath. 04/15/18  Yes Terald Sleeper, PA-C  ALPRAZolam (XANAX) 0.25 MG tablet TAKEK 1/2 TO 1  TABLET 3 TIMES DAILY AS NEEDED Patient taking differently: Take 0.125-0.25 mg by mouth 3 (three) times daily as needed for anxiety.  01/30/18  Yes Terald Sleeper, PA-C  amiodarone (PACERONE) 200 MG tablet Take 2 tablets (400 mg total) by mouth daily. Please take 400 mg total by mouth daily for 7 days (end date 06/24/18) Then 200 mg total by mouth daily for remaining days Patient taking differently: Take 200 mg by mouth daily.  06/17/18 07/02/2018 Yes Kayleen Memos, DO  apixaban (ELIQUIS) 5 MG TABS tablet Take 1 tablet (5 mg total) by mouth 2 (two) times daily. 06/11/18  Yes Michael Boston, MD  citalopram (CELEXA) 40 MG tablet TAKE ONE (1) TABLET EACH DAY Patient taking differently: Take 40 mg by mouth daily.  01/30/18  Yes Terald Sleeper, PA-C  denosumab (PROLIA) 60 MG/ML SOSY injection Inject 60 mg into the skin every 6 (six) months. 02/18/18  Yes Terald Sleeper, PA-C  esomeprazole (NEXIUM) 40 MG capsule TAKE ONE (1) [place] Patient taking differently: Take 40 mg by mouth daily.  01/30/18  Yes Terald Sleeper, PA-C  ferrous sulfate 325 (65 FE) MG EC tablet Take 325 mg by mouth daily with breakfast.    Yes [provider]  hydrALAZINE (APRESOLINE) 50 MG tablet TAKE ONE (1) TABLET THREE (3) TIMES EACH DAY Patient taking differently: Take 50 mg by mouth 3 (three) times daily.  01/30/18  Yes Terald Sleeper, PA-C  HYDROcodone-homatropine (HYCODAN) 5-1.5 MG/5ML syrup Take 5 mLs by mouth every 8 (eight) hours as needed for cough. 06/29/18  Yes Terald Sleeper, PA-C  isosorbide mononitrate (IMDUR) 30 MG 24 hr tablet Take 1 tablet (30 mg total) by mouth every morning. 01/30/18  Yes Terald Sleeper, PA-C  levothyroxine (SYNTHROID, LEVOTHROID) 50 MCG tablet TAKE ONE TABLET EACH MORNING BEFORE BREAKFAST Patient taking differently: Take 50 mcg by mouth daily before breakfast.  01/30/18  Yes Terald Sleeper, PA-C  loratadine (CLARITIN) 10 MG tablet Take 1 tablet (10 mg total) by mouth at bedtime. 01/30/18  Yes  Terald Sleeper, PA-C  losartan (COZAAR) 100 MG tablet TAKE ONE (1) TABLET EACH DAY Patient taking differently: Take 100 mg by mouth daily.  01/30/18  Yes Terald Sleeper, PA-C  Meclizine HCl 25 MG CHEW TAKE 1 TABLET 3 TIMES DAILY AS NEEDED FOR DIZZINESS Patient taking differently: Chew 25 mg by mouth 3 (three) times daily as needed (dizziness).  10/15/17  Yes Terald Sleeper, PA-C  metoprolol succinate (TOPROL-XL) 25 MG 24 hr tablet  Take 0.5 tablets (12.5 mg total) by mouth daily. 05/05/18  Yes Terald Sleeper, PA-C  montelukast (SINGULAIR) 10 MG tablet Take 1 tablet (10 mg total) by mouth at bedtime. 01/30/18  Yes Terald Sleeper, PA-C  nitroGLYCERIN (NITROSTAT) 0.4 MG SL tablet Place 1 tablet (0.4 mg total) under the tongue every 5 (five) minutes as needed for chest pain. Reported on 04/26/2015 01/13/17  Yes Herminio Commons, MD  nystatin (MYCOSTATIN/NYSTOP) powder Apply topically 4 (four) times daily. Patient taking differently: Apply 1 Bottle topically as needed (yeast).  05/05/18  Yes Terald Sleeper, PA-C  ondansetron (ZOFRAN-ODT) 4 MG disintegrating tablet Take 1 tablet (4 mg total) by mouth every 6 (six) hours as needed for nausea or vomiting. 06/11/18  Yes Gross, Remo Lipps, MD  polyethylene glycol powder (GAVILAX) powder MIX 17 GRAMS IN 80Z OF WATER/JUICE AND DRINK TWICE DAILY Patient taking differently: Take 17 g by mouth daily.  10/15/17  Yes Terald Sleeper, PA-C  Propylene Glycol (SYSTANE BALANCE OP) Apply 1 drop to eye daily as needed (dry eyes).   Yes [provider]  rosuvastatin (CRESTOR) 10 MG tablet TAKE ONE (1) TABLET EACH DAY Patient taking differently: Take 10 mg by mouth daily.  01/30/18  Yes Terald Sleeper, PA-C  traMADol (ULTRAM) 50 MG tablet Take 1 tablet (50 mg total) by mouth every 12 (twelve) hours as needed. Patient taking differently: Take 50 mg by mouth daily as needed for moderate pain.  06/12/17  Yes Alla Feeling, NP  Vitamin D, Ergocalciferol, (DRISDOL) 50000 units  CAPS capsule TAKE 1 CAPSULE ON TUESDAYS Patient taking differently: Take 50,000 Units by mouth every Tuesday.  09/11/17  Yes Terald Sleeper, PA-C  zolpidem (AMBIEN) 10 MG tablet Take 10 mg by mouth at bedtime.   Yes [provider]  benzonatate (TESSALON) 200 MG capsule Take 1 capsule (200 mg total) by mouth 2 (two) times daily as needed for cough. Patient not taking: Reported on 07/14/2018 06/18/18   Terald Sleeper, PA-C  fluticasone (FLONASE) 50 MCG/ACT nasal spray USE 2 SPRAYS IN EACH NOSTRIL DAILY Patient taking differently: Place 2 sprays into both nostrils daily.  10/15/17   Terald Sleeper, PA-C    Physical Exam: Vitals:   07/14/18 1500 07/14/18 1515 07/14/18 1600 07/14/18 1715  BP:      Pulse: 79 78 79 79  Resp: (!) 23 (!) 22 (!) 24 (!) 24  Temp:      TempSrc:      SpO2: 99% 98% 98% 99%  Weight:      Height:        Constitutional: NAD, calm, comfortable Vitals:   07/14/18 1500 07/14/18 1515 07/14/18 1600 07/14/18 1715  BP:      Pulse: 79 78 79 79  Resp: (!) 23 (!) 22 (!) 24 (!) 24  Temp:      TempSrc:      SpO2: 99% 98% 98% 99%  Weight:      Height:       Eyes: PERRL, lids and conjunctivae normal ENMT: Mucous membranes are moist. Posterior pharynx clear of any exudate or lesions Neck: normal, supple, no masses, no thyromegaly Respiratory: Normal respiratory effort. No accessory muscle use.  Cardiovascular: Regular rate and rhythm, no murmurs / rubs / gallops. No extremity edema. 2+ pedal pulses.   Abdomen: no tenderness, no masses palpated. No hepatosplenomegaly. Bowel sounds positive.  Colostomy in situ left side of abdomen. Musculoskeletal: no clubbing / cyanosis. No joint deformity  upper and lower extremities. Good ROM, no contractures. Normal muscle tone.  Skin: no rashes, lesions, ulcers. No induration Neurologic: CN 2-12 grossly intact.  Strength 5/5 in all 4.  Psychiatric: Normal judgment and insight. Alert and oriented x 3. Normal mood.   Labs on  Admission: I have personally reviewed following labs and imaging studies  CBC: Recent Labs  Lab 07/14/18 1449  WBC 9.6  NEUTROABS 6.6  HGB 10.1*  HCT 34.6*  MCV 86.3  PLT 510   Basic Metabolic Panel: Recent Labs  Lab 07/14/18 1512  NA 131*  K 4.1  CL 99  CO2 23  GLUCOSE 106*  BUN 8  CREATININE 1.13*  CALCIUM 8.2*   Liver Function Tests: Recent Labs  Lab 07/14/18 1512  AST 23  ALT 15  ALKPHOS 78  BILITOT 0.6  PROT 6.2*  ALBUMIN 3.0*   Recent Labs  Lab 07/14/18 1512  LIPASE 19   Cardiac Enzymes: Recent Labs  Lab 07/14/18 1449  TROPONINI 0.04*    Radiological Exams on Admission: Dg Chest Portable 1 View  Result Date: 07/14/2018 CLINICAL DATA:  Chest pain and shortness of breath. EXAM: PORTABLE CHEST 1 VIEW COMPARISON:  Chest x-Raffaela dated June 14, 2018. FINDINGS: Stable cardiomegaly. Normal pulmonary vascularity. Increasing now moderate right pleural effusion with right basilar opacity. Unchanged small left pleural effusion with mild left lower lobe subsegmental atelectasis. No pneumothorax. No acute osseous abnormality. IMPRESSION: 1. Enlarging now moderate right pleural effusion with adjacent right basilar atelectasis versus consolidation. 2. Unchanged small left pleural effusion. Electronically Signed   By: Titus Dubin M.D.   On: 07/14/2018 14:01    EKG: Independently reviewed.  QTc 566, sinus rhythm.  Incomplete left bundle branch block.  No significant T or T wave changes compared to prior.  Assessment/Plan Active Problems:   Pleural effusion   Dyspnea- likely secondary to pleural effusion possibly parapneumonic, chest x-Nea suggest adjacent atelectasis versus consolidation.  Differentials include CHF related pleural effusion, but weights are stable and BNP is not significantly elevated.  WBC 9.6, without leukopenia or lactic acidosis. Last dose eliquis today - Considering recent hospitalization, will start empiric coverage for HCAP with cefepime and  vancomycin -Rule out COVID-19, patient low risk without sick contact or flulike symptoms. -BMP CBC a.m. -Obtain blood cultures -Would benefit from thoracocentesis. Please consult pulmonary/interventional radiology for thoracocentesis in a.m. But last dose of eliquis was today.  Paroxysmal atrial fibrillation-currently in sinus rhythm.  Rate controlled- 80s, anticoagulated with Eliquis. -Continue home amiodarone and metoprolol -Hold Eliquis for thoracocentesis  Hypertension-systolic 258N to 277O. -Continue home hydralazine, metoprolol, losartan, Imdur.  CAD- LAD stent 2013 with negative Myoview 2016.  Recent hospitalization with troponin peaking at 10.  Troponin today 0.04.  EKG without significant ST or T wave abnormalities. -Continue home metoprolol, Imdur, Crestor. -Trend Troponin  Prolonged QTC-566. Appears new.  Likely related to amiodarone.  Patient also on Celexa.  Potassium 4.1. -Continue amiodarone -Hold Celexa -Check magnesium -Repeat EKG in a.m.  Diastolic CHF-weight stable, no sign of peripheral edema.  BNP not significantly elevated.  06/07/2018-EF 60 to 24%, diastolic parameters not evaluated due to atrial fibrillation.  Not on diuretics at home.  Depression and anxiety-Home medications include Celexa -Hold Celexa for now in the setting of prolonged QTC  DVT prophylaxis: SCDS pending paracentesis. Code Status: Full Family Communication: None at bedside Disposition Plan: per rounding team Consults called: Please consult Pulmonology/interventional radiology in a.m Admission status: OBS, telemetry.  Bethena Roys MD [facility]  07/14/2018, 10:36 PM

## 2018-07-15 ENCOUNTER — Observation Stay (HOSPITAL_COMMUNITY): Payer: Medicare Other

## 2018-07-15 ENCOUNTER — Encounter (HOSPITAL_COMMUNITY): Payer: Self-pay | Admitting: Student

## 2018-07-15 DIAGNOSIS — K440 Diaphragmatic hernia with obstruction, without gangrene: Secondary | ICD-10-CM | POA: Diagnosis present

## 2018-07-15 DIAGNOSIS — J189 Pneumonia, unspecified organism: Secondary | ICD-10-CM | POA: Diagnosis present

## 2018-07-15 DIAGNOSIS — Y95 Nosocomial condition: Secondary | ICD-10-CM | POA: Diagnosis present

## 2018-07-15 DIAGNOSIS — E039 Hypothyroidism, unspecified: Secondary | ICD-10-CM | POA: Diagnosis present

## 2018-07-15 DIAGNOSIS — Z808 Family history of malignant neoplasm of other organs or systems: Secondary | ICD-10-CM | POA: Diagnosis not present

## 2018-07-15 DIAGNOSIS — M5136 Other intervertebral disc degeneration, lumbar region: Secondary | ICD-10-CM | POA: Diagnosis present

## 2018-07-15 DIAGNOSIS — I2510 Atherosclerotic heart disease of native coronary artery without angina pectoris: Secondary | ICD-10-CM | POA: Diagnosis present

## 2018-07-15 DIAGNOSIS — F411 Generalized anxiety disorder: Secondary | ICD-10-CM | POA: Diagnosis present

## 2018-07-15 DIAGNOSIS — C182 Malignant neoplasm of ascending colon: Secondary | ICD-10-CM | POA: Diagnosis present

## 2018-07-15 DIAGNOSIS — I272 Pulmonary hypertension, unspecified: Secondary | ICD-10-CM | POA: Diagnosis present

## 2018-07-15 DIAGNOSIS — E7800 Pure hypercholesterolemia, unspecified: Secondary | ICD-10-CM | POA: Diagnosis present

## 2018-07-15 DIAGNOSIS — Z85038 Personal history of other malignant neoplasm of large intestine: Secondary | ICD-10-CM | POA: Diagnosis not present

## 2018-07-15 DIAGNOSIS — Z8673 Personal history of transient ischemic attack (TIA), and cerebral infarction without residual deficits: Secondary | ICD-10-CM | POA: Diagnosis not present

## 2018-07-15 DIAGNOSIS — F329 Major depressive disorder, single episode, unspecified: Secondary | ICD-10-CM | POA: Diagnosis present

## 2018-07-15 DIAGNOSIS — Z85820 Personal history of malignant melanoma of skin: Secondary | ICD-10-CM | POA: Diagnosis not present

## 2018-07-15 DIAGNOSIS — N183 Chronic kidney disease, stage 3 (moderate): Secondary | ICD-10-CM | POA: Diagnosis present

## 2018-07-15 DIAGNOSIS — Z20828 Contact with and (suspected) exposure to other viral communicable diseases: Secondary | ICD-10-CM | POA: Diagnosis present

## 2018-07-15 DIAGNOSIS — K219 Gastro-esophageal reflux disease without esophagitis: Secondary | ICD-10-CM | POA: Diagnosis present

## 2018-07-15 DIAGNOSIS — J9601 Acute respiratory failure with hypoxia: Secondary | ICD-10-CM | POA: Diagnosis present

## 2018-07-15 DIAGNOSIS — Z955 Presence of coronary angioplasty implant and graft: Secondary | ICD-10-CM | POA: Diagnosis not present

## 2018-07-15 DIAGNOSIS — I5032 Chronic diastolic (congestive) heart failure: Secondary | ICD-10-CM | POA: Diagnosis present

## 2018-07-15 DIAGNOSIS — J90 Pleural effusion, not elsewhere classified: Secondary | ICD-10-CM | POA: Diagnosis present

## 2018-07-15 DIAGNOSIS — H3530 Unspecified macular degeneration: Secondary | ICD-10-CM | POA: Diagnosis present

## 2018-07-15 DIAGNOSIS — I480 Paroxysmal atrial fibrillation: Secondary | ICD-10-CM | POA: Diagnosis present

## 2018-07-15 DIAGNOSIS — R0602 Shortness of breath: Secondary | ICD-10-CM | POA: Diagnosis present

## 2018-07-15 DIAGNOSIS — I130 Hypertensive heart and chronic kidney disease with heart failure and stage 1 through stage 4 chronic kidney disease, or unspecified chronic kidney disease: Secondary | ICD-10-CM | POA: Diagnosis present

## 2018-07-15 HISTORY — PX: IR THORACENTESIS ASP PLEURAL SPACE W/IMG GUIDE: IMG5380

## 2018-07-15 LAB — LACTATE DEHYDROGENASE, PLEURAL OR PERITONEAL FLUID: LD, Fluid: 100 U/L — ABNORMAL HIGH (ref 3–23)

## 2018-07-15 LAB — BODY FLUID CELL COUNT WITH DIFFERENTIAL
Eos, Fluid: 0 %
Lymphs, Fluid: 79 %
Monocyte-Macrophage-Serous Fluid: 16 % — ABNORMAL LOW (ref 50–90)
Neutrophil Count, Fluid: 5 % (ref 0–25)
Total Nucleated Cell Count, Fluid: 121 cu mm (ref 0–1000)

## 2018-07-15 LAB — CBC
HCT: 30.8 % — ABNORMAL LOW (ref 36.0–46.0)
Hemoglobin: 9.4 g/dL — ABNORMAL LOW (ref 12.0–15.0)
MCH: 25.5 pg — ABNORMAL LOW (ref 26.0–34.0)
MCHC: 30.5 g/dL (ref 30.0–36.0)
MCV: 83.5 fL (ref 80.0–100.0)
Platelets: 276 10*3/uL (ref 150–400)
RBC: 3.69 MIL/uL — ABNORMAL LOW (ref 3.87–5.11)
RDW: 17.2 % — ABNORMAL HIGH (ref 11.5–15.5)
WBC: 8.5 10*3/uL (ref 4.0–10.5)
nRBC: 0 % (ref 0.0–0.2)

## 2018-07-15 LAB — GRAM STAIN

## 2018-07-15 LAB — BASIC METABOLIC PANEL
Anion gap: 13 (ref 5–15)
BUN: 7 mg/dL — ABNORMAL LOW (ref 8–23)
CO2: 20 mmol/L — ABNORMAL LOW (ref 22–32)
Calcium: 8.2 mg/dL — ABNORMAL LOW (ref 8.9–10.3)
Chloride: 101 mmol/L (ref 98–111)
Creatinine, Ser: 1.21 mg/dL — ABNORMAL HIGH (ref 0.44–1.00)
GFR calc Af Amer: 49 mL/min — ABNORMAL LOW (ref >60)
GFR calc non Af Amer: 42 mL/min — ABNORMAL LOW (ref >60)
Glucose, Bld: 105 mg/dL — ABNORMAL HIGH (ref 70–99)
Potassium: 4 mmol/L (ref 3.5–5.1)
Sodium: 134 mmol/L — ABNORMAL LOW (ref 135–145)

## 2018-07-15 LAB — ALBUMIN, PLEURAL OR PERITONEAL FLUID: Albumin, Fluid: 1.6 g/dL

## 2018-07-15 LAB — GLUCOSE, PLEURAL OR PERITONEAL FLUID: Glucose, Fluid: 103 mg/dL

## 2018-07-15 LAB — ABO/RH: ABO/RH(D): O POS

## 2018-07-15 LAB — SARS CORONAVIRUS 2 BY RT PCR (HOSPITAL ORDER, PERFORMED IN ~~LOC~~ HOSPITAL LAB): SARS Coronavirus 2: NEGATIVE

## 2018-07-15 MED ORDER — ALPRAZOLAM 0.25 MG PO TABS
0.2500 mg | ORAL_TABLET | Freq: Three times a day (TID) | ORAL | Status: DC | PRN
  Administered 2018-07-15 – 2018-07-16 (×2): 0.25 mg via ORAL
  Filled 2018-07-15 (×2): qty 1

## 2018-07-15 MED ORDER — LIDOCAINE HCL 1 % IJ SOLN
INTRAMUSCULAR | Status: AC | PRN
  Administered 2018-07-15: 10 mL

## 2018-07-15 MED ORDER — LIDOCAINE HCL 1 % IJ SOLN
INTRAMUSCULAR | Status: AC
  Filled 2018-07-15: qty 20

## 2018-07-15 MED ORDER — GUAIFENESIN-DM 100-10 MG/5ML PO SYRP
5.0000 mL | ORAL_SOLUTION | ORAL | Status: DC | PRN
  Administered 2018-07-15 – 2018-07-16 (×3): 5 mL via ORAL
  Filled 2018-07-15 (×3): qty 5

## 2018-07-15 NOTE — Progress Notes (Addendum)
PROGRESS NOTE  [name] TIW:[id number] DOB: [birth year] DOA: 07/14/2018 PCP: Terald Sleeper, PA-C  HPI/Recap of past 24 hours: [name] is a 84 y.o. female with medical history significant for coronary artery disease, CKD 3, atrial fibrillation, diastolic CHF, pulmonary hypertension, hypothyroidism, right colon cancer.  Who presented to the ED with complaints of increasing difficulty breathing and cough productive of whitish sputum over the past month-since she was discharged from the hospital. She denies fever or chills, no sore throat, no myalgias, no sick contacts, she has been home over the past few weeks.  No leg swelling.  No chest pain. Patient's home health RN evaluated patient at home, compelled patient to come to the ED-as her difficulty breathing was worse O2 sats 92% while sitting dropped to 80s on standing. A week ago patient declined coming to the hospital due to fear of COVID infection, but per chat- Last Thursday, she did not go to the ED, the EMS gave her O2 and discouraged her from going to ED d/t COVID-19. ED Course: Tachypneic to 27, O2 sats at the time of evaluation 90% on room air.  With ambulation in the ED O2 sats dropped to 88%. WBC 9.6, without lymphopenia.  Mild lactic acid 1.  Sodium 131. Cr-1.13.  Stable chest x-Lorian showed enlarging now moderate right pleural effusion with adjacent right basilar atelectasis versus consolidation, unchanged small left pleural effusion.  Hospitalist called to admit for pleural effusion.  07/15/18: Patient seen and examined at her bedside.  Report dyspnea at rest pre-thoracentesis.  IR right thoracentesis completed this morning with 1 L of dark yellow fluid removed.  Fluid sent for cytology and analysis.  She denies chest pain or palpitations.   Assessment/Plan: Active Problems:   Pleural effusion  Acute hypoxic respiratory failure secondary to large right pleural effusion Independently reviewed chest x-Annamary done on admission which  revealed large right pleural effusion COVID-19 negative No sign of active infective process Afebrile with no leukocytosis Right thoracentesis done on 07/15/2018 with 1 L of dark yellow fluid removed Fluid sent for analysis and cytology, awaiting results Maintain O2 saturation greater than 92% Home O2 evaluation prior to discharge possibly tomorrow  Large right pleural effusion, unclear etiology No sign of active infective process Possibly cardiogenic Fluid sent out for cytology and analysis  Chronic diastolic CHF Last 2D echo done on 06/07/2018 revealed LVEF 60 to 65% Strict I's and O's and daily weight Continue cardiac medications  Paroxysmal A. fib Rate controlled on beta-blocker On Eliquis for CVA prevention Resume Eliquis post thoracentesis  Hypertension Blood pressure is normotensive Resume home medications  CKD 3 Creatinine appears to be at baseline 1.13 with GFR 46 Continue to avoid nephrotoxic agents Continue to monitor urine output Repeat BMP in the morning  Prolonged QTC Presented with QTC 566 on initial twelve-lead EKG Repeat twelve-lead EKG  Coronary artery disease status post PCI with stent Continue metoprolol, Imdur, Crestor  Chronic depression/anxiety Continue to hold Celexa Repeat twelve-lead EKG Resume antidepressant once QTC has normalized   Code Status: Full code  Family Communication: None at bedside  Disposition Plan: Home when hemodynamically stable possibly tomorrow 07/16/2018   Consultants:  None  Procedures:  Right thoracentesis on 07/15/2018  Antimicrobials:  None  DVT prophylaxis: Resume Eliquis post thoracentesis   Objective: Vitals:   07/14/18 2242 07/15/18 0600 07/15/18 0747 07/15/18 1056  BP: (!) 162/91 (!) 150/82 137/85 137/85  Pulse:  82 (!) 111 (!) 101  Resp:   16  Temp:  98.4 F (36.9 C) 98.4 F (36.9 C) 98.4 F (36.9 C)  TempSrc:  Oral Oral Oral  SpO2:  98% 97% 100%  Weight:      Height:         Intake/Output Summary (Last 24 hours) at 07/15/2018 1139 Last data filed at 07/15/2018 0900 Gross per 24 hour  Intake 840 ml  Output -  Net 840 ml   Filed Weights   07/14/18 1331 07/14/18 2139  Weight: 73 kg 68.3 kg    Exam:  . General: 84 y.o. year-old female well developed well nourished in no acute distress.  Alert and oriented x3. . Cardiovascular: Regular rate and rhythm with no rubs or gallops.  No thyromegaly or JVD noted.   Marland Kitchen Respiratory: Mild rales at bases bilaterally.  Poor inspiratory effort. . Abdomen: Soft nontender nondistended with normal bowel sounds x4 quadrants. . Musculoskeletal: No lower extremity edema. 2/4 pulses in all 4 extremities. Marland Kitchen Psychiatry: Mood is appropriate for condition and setting   Data Reviewed: CBC: Recent Labs  Lab 07/14/18 1449 07/15/18 0125  WBC 9.6 8.5  NEUTROABS 6.6  --   HGB 10.1* 9.4*  HCT 34.6* 30.8*  MCV 86.3 83.5  PLT 348 086   Basic Metabolic Panel: Recent Labs  Lab 07/14/18 1512 07/15/18 0125  NA 131* 134*  K 4.1 4.0  CL 99 101  CO2 23 20*  GLUCOSE 106* 105*  BUN 8 7*  CREATININE 1.13* 1.21*  CALCIUM 8.2* 8.2*   GFR: Estimated Creatinine Clearance: 33 mL/min (A) (by C-G formula based on SCr of 1.21 mg/dL (H)). Liver Function Tests: Recent Labs  Lab 07/14/18 1512  AST 23  ALT 15  ALKPHOS 78  BILITOT 0.6  PROT 6.2*  ALBUMIN 3.0*   Recent Labs  Lab 07/14/18 1512  LIPASE 19   No results for input(s): AMMONIA in the last 168 hours. Coagulation Profile: No results for input(s): INR, PROTIME in the last 168 hours. Cardiac Enzymes: Recent Labs  Lab 07/14/18 1449 07/14/18 2015  TROPONINI 0.04* 0.03*   BNP (last 3 results) No results for input(s): PROBNP in the last 8760 hours. HbA1C: No results for input(s): HGBA1C in the last 72 hours. CBG: No results for input(s): GLUCAP in the last 168 hours. Lipid Profile: No results for input(s): CHOL, HDL, LDLCALC, TRIG, CHOLHDL, LDLDIRECT in the last 72  hours. Thyroid Function Tests: No results for input(s): TSH, T4TOTAL, FREET4, T3FREE, THYROIDAB in the last 72 hours. Anemia Panel: No results for input(s): VITAMINB12, FOLATE, FERRITIN, TIBC, IRON, RETICCTPCT in the last 72 hours. Urine analysis:    Component Value Date/Time   COLORURINE AMBER (A) 06/14/2018 1328   APPEARANCEUR HAZY (A) 06/14/2018 1328   APPEARANCEUR Clear 05/13/2016 0856   LABSPEC 1.028 06/14/2018 1328   PHURINE 5.0 06/14/2018 1328   GLUCOSEU NEGATIVE 06/14/2018 1328   HGBUR NEGATIVE 06/14/2018 1328   BILIRUBINUR NEGATIVE 06/14/2018 1328   BILIRUBINUR Negative 05/13/2016 0856   KETONESUR NEGATIVE 06/14/2018 1328   PROTEINUR 30 (A) 06/14/2018 1328   UROBILINOGEN 0.2 02/10/2015 1936   NITRITE NEGATIVE 06/14/2018 1328   LEUKOCYTESUR NEGATIVE 06/14/2018 1328   Sepsis Labs: @LABRCNTIP (procalcitonin:4,lacticidven:4)  ) Recent Results (from the past 240 hour(s))  SARS Coronavirus 2 [facility] order, Performed in [place] hospital lab)     Status: None   Collection Time: 07/14/18  1:59 AM  Result Value Ref Range Status   SARS Coronavirus 2 NEGATIVE NEGATIVE Final    Comment: (NOTE) If result is  NEGATIVE SARS-CoV-2 target nucleic acids are NOT DETECTED. The SARS-CoV-2 RNA is generally detectable in upper and lower  respiratory specimens during the acute phase of infection. The lowest  concentration of SARS-CoV-2 viral copies this assay can detect is 250  copies / mL. A negative result does not preclude SARS-CoV-2 infection  and should not be used as the sole basis for treatment or other  patient management decisions.  A negative result may occur with  improper specimen collection / handling, submission of specimen other  than nasopharyngeal swab, presence of viral mutation(s) within the  areas targeted by this assay, and inadequate number of viral copies  (<250 copies / mL). A negative result must be combined with clinical  observations, patient history, and  epidemiological information. If result is POSITIVE SARS-CoV-2 target nucleic acids are DETECTED. The SARS-CoV-2 RNA is generally detectable in upper and lower  respiratory specimens dur ing the acute phase of infection.  Positive  results are indicative of active infection with SARS-CoV-2.  Clinical  correlation with patient history and other diagnostic information is  necessary to determine patient infection status.  Positive results do  not rule out bacterial infection or co-infection with other viruses. If result is PRESUMPTIVE POSTIVE SARS-CoV-2 nucleic acids MAY BE PRESENT.   A presumptive positive result was obtained on the submitted specimen  and confirmed on repeat testing.  While 2019 novel coronavirus  (SARS-CoV-2) nucleic acids may be present in the submitted sample  additional confirmatory testing may be necessary for epidemiological  and / or clinical management purposes  to differentiate between  SARS-CoV-2 and other Sarbecovirus currently known to infect humans.  If clinically indicated additional testing with an alternate test  methodology [phone]) is advised. The SARS-CoV-2 RNA is generally  detectable in upper and lower respiratory sp ecimens during the acute  phase of infection. The expected result is Negative. Fact Sheet for Patients:  StrictlyIdeas.no Fact Sheet for Healthcare Providers: BankingDealers.co.za This test is not yet approved or cleared by the [place] FDA and has been authorized for detection and/or diagnosis of SARS-CoV-2 by FDA under an Emergency Use Authorization (EUA).  This EUA will remain in effect (meaning this test can be used) for the duration of the COVID-19 declaration under Section 564(b)(1) of the Act, 21 U.S.C. section 360bbb-3(b)(1), unless the authorization is terminated or revoked sooner. Performed at [facility], [place] [address], [place], [place] [address]   Culture,  blood (routine x 2)     Status: None (Preliminary result)   Collection Time: 07/14/18  6:10 PM  Result Value Ref Range Status   Specimen Description BLOOD LEFT ANTECUBITAL  Final   Special Requests   Final    BOTTLES DRAWN AEROBIC AND ANAEROBIC Blood Culture adequate volume   Culture   Final    NO GROWTH < 12 HOURS Performed at [facility], [address], [place], [place] [address]    Report Status PENDING  Incomplete  Culture, blood (routine x 2)     Status: None (Preliminary result)   Collection Time: 07/14/18  6:10 PM  Result Value Ref Range Status   Specimen Description BLOOD BLOOD LEFT WRIST  Final   Special Requests   Final    BOTTLES DRAWN AEROBIC AND ANAEROBIC Blood Culture adequate volume   Culture   Final    NO GROWTH < 12 HOURS Performed at [facility], [address] [place], [place] [address]    Report Status PENDING  Incomplete  Studies: Dg Chest 1 View  Result Date: 07/15/2018 CLINICAL DATA:  Followup right thoracentesis EXAM: CHEST  1 VIEW COMPARISON:  07/14/2018 FINDINGS: There is less pleural fluid on the right with better aeration of the right lung. Small to moderate effusion does persist. There is a small effusion on the left with left base atelectasis. IMPRESSION: Reduction in amount of pleural fluid on the right with better aeration of the right lung. No visible pneumothorax. Small to moderate right effusion does persist with right base volume loss. Small effusion persists on the left. Electronically Signed   By: Nelson Chimes M.D.   On: 07/15/2018 10:52   Dg Chest Portable 1 View  Result Date: 07/14/2018 CLINICAL DATA:  Chest pain and shortness of breath. EXAM: PORTABLE CHEST 1 VIEW COMPARISON:  Chest x-[place] dated June 14, 2018. FINDINGS: Stable cardiomegaly. Normal pulmonary vascularity. Increasing now moderate right pleural effusion with right basilar opacity. Unchanged small left pleural effusion with mild left lower lobe subsegmental atelectasis. No  pneumothorax. No acute osseous abnormality. IMPRESSION: 1. Enlarging now moderate right pleural effusion with adjacent right basilar atelectasis versus consolidation. 2. Unchanged small left pleural effusion. Electronically Signed   By: Titus Dubin M.D.   On: 07/14/2018 14:01    Scheduled Meds: . amiodarone  200 mg Oral Daily  . citalopram  40 mg Oral Daily  . fluticasone  2 spray Each Nare Daily  . hydrALAZINE  50 mg Oral TID  . isosorbide mononitrate  30 mg Oral q morning - 10a  . levothyroxine  50 mcg Oral Q0600  . lidocaine      . losartan  100 mg Oral Daily  . metoprolol succinate  12.5 mg Oral Daily  . montelukast  10 mg Oral QHS  . pantoprazole  40 mg Oral Daily  . rosuvastatin  10 mg Oral Daily  . zolpidem  5 mg Oral QHS    Continuous Infusions: . ceFEPime (MAXIPIME) IV Stopped (07/14/18 1911)  . vancomycin       LOS: 0 days     Kayleen Memos, MD [facility] Pager [phone]  If 7PM-7AM, please contact night-coverage [email or web address] Password [facility] 07/15/2018, 11:39 AM

## 2018-07-15 NOTE — Procedures (Signed)
PROCEDURE SUMMARY:  Successful US guided diagnostic and therapeutic right thoracentesis. Yielded 1.0 liters of dark, yellow fluid. Pt tolerated procedure well. No immediate complications.  Specimen was sent for labs. CXR ordered.  EBL < 5 mL  Docia Barrier PA-C 07/15/2018 10:50 AM

## 2018-07-15 NOTE — TOC Initial Note (Addendum)
Transition of Care [place]) - Initial/Assessment Note    Patient Details  Name: [name] MRN: [id number] Date of Birth: [birth year]  Transition of Care [facility] [place]) CM/SW Contact:    Zenon Mayo, RN Phone Number: 07/15/2018, 2:06 PM  Clinical Narrative:                 From home alone, active with [facility] for HHRN,HHPT,HHOT, and would like to continue with [facility] services at home, list is on chart.  Patient states has a walker at home, has no problem with getting medication, she states her daughter will be her transport at discharge.  She has PCP.  This NCM notified Tiffany with [facility] , she is aware.   Expected Discharge Plan: [place] Barriers to Discharge: No Barriers Identified   Patient Goals and CMS Choice Patient states their goals for this hospitalization and ongoing recovery are:: get to where she can breathe CMS Medicare.gov Compare Post Acute Care list provided to:: Patient Choice offered to / list presented to : Patient  Expected Discharge Plan and Services Expected Discharge Plan: [place] In-house Referral: NA Discharge Planning Services: CM Consult Post Acute Care Choice: Home Health, Resumption of Svcs/PTA Provider Living arrangements for the past 2 months: Single Family Home                 DME Arranged: N/A DME Agency: NA HH Arranged: RN, PT, OT Register Agency: [facility] (formerly [employer])  Prior Living Arrangements/Services Living arrangements for the past 2 months: [place] with:: Self Patient language and need for interpreter reviewed:: Yes Do you feel safe going back to the place where you live?: Yes      Need for Family Participation in Patient Care: No (Comment) Care giver support system in place?: No (comment) Current home services: Home RN, Home PT, Home OT Criminal Activity/Legal Involvement Pertinent to Current Situation/Hospitalization: No - Comment as needed  Activities of Daily  Living Home Assistive Devices/Equipment: Environmental consultant (specify type) ADL Screening (condition at time of admission) Patient's cognitive ability adequate to safely complete daily activities?: Yes Is the patient deaf or have difficulty hearing?: No Does the patient have difficulty seeing, even when wearing glasses/contacts?: No Does the patient have difficulty concentrating, remembering, or making decisions?: No Patient able to express need for assistance with ADLs?: Yes Does the patient have difficulty dressing or bathing?: No Independently performs ADLs?: Yes (appropriate for developmental age) Does the patient have difficulty walking or climbing stairs?: No Weakness of Legs: None Weakness of Arms/Hands: None  Permission Sought/Granted                  Emotional Assessment   Attitude/Demeanor/Rapport: Engaged Affect (typically observed): Accepting, Appropriate Orientation: : Oriented to Self, Oriented to Place, Oriented to  Time, Oriented to Situation   Psych Involvement: No (comment)  Admission diagnosis:  Shortness of breath [R06.02] Hypoxia [R09.02] Pleural effusion, right [J90] Patient Active Problem List   Diagnosis Date Noted  . Pleural effusion 07/14/2018  . Weakness generalized 06/14/2018  . Renal failure (ARF), acute on chronic ([place]) 06/14/2018  . Non-STEMI (non-ST elevated myocardial infarction) ([place]) 06/11/2018  . Current use of long term anticoagulation 06/11/2018  . Incarcerated recurrent hiatal hernia s/p robotic repair 06/05/2018 06/05/2018  . GERD (gastroesophageal reflux disease) s/p [place] fundoplication (partial posterior wrap) 06/05/2018  . S/P robotic partial posterior ([place]) fundoplication 07/08/6757 16/38/4665  . Pulmonary hypertension ([place])   .  Hypothyroidism   . History of [name]'s esophagus   . Anxiety state   . Acquired hypothyroidism 01/30/2018  . Cancer of right colon ([place]) 05/20/2017  . Irritable bowel syndrome with constipation 10/15/2016  .  Iron deficiency 08/13/2016  . Discitis of lumbar region L5- S1 03/01/2016  . Chronic diastolic CHF (congestive heart failure) ([place]) 03/01/2016  . DDD (degenerative disc disease), lumbar 02/12/2016  . History of GI bleed 12/18/2015  . Dehydration 08/29/2015  . Gastric volvulus 08/29/2015  . Erosive esophagitis 08/29/2015  . Atrial fibrillation with RVR ([place]) 08/09/2015  . TIA (transient ischemic attack)   . Pressure ulcer 06/17/2015  . Essential hypertension   . Colonic mass 05/02/2015  . Anemia due to chronic blood loss 05/02/2015  . Anxiety and depression 05/02/2015  . CKD (chronic kidney disease) stage 3, GFR 30-59 ml/min (HCC) 03/03/2015  . History of percutaneous coronary intervention   . Coronary artery disease   . HYPERCHOLESTEROLEMIA 07/16/2007  . Major depressive disorder, single episode, unspecified 07/16/2007   PCP:  Terald Sleeper, PA-C Pharmacy:   [facility], [address] [place] [address] Phone: [phone] Fax: [phone]     Social Determinants of Health (SDOH) Interventions    Readmission Risk Interventions Readmission Risk Prevention Plan 07/15/2018 06/17/2018 06/17/2018  Transportation Screening Complete Complete Complete  Medication Review Press photographer) Complete Complete Complete  PCP or Specialist appointment within 3-5 days of discharge Complete Complete Complete  HRI or Home Care Consult Complete Complete -  SW Recovery Care/Counseling Consult Not Complete Complete -  SW Consult Not Complete Comments NA - -  Palliative Care Screening Not Applicable Not Applicable -  [place] - Patient Refused Patient Refused  Some recent data might be hidden

## 2018-07-16 DIAGNOSIS — C182 Malignant neoplasm of ascending colon: Secondary | ICD-10-CM

## 2018-07-16 DIAGNOSIS — F329 Major depressive disorder, single episode, unspecified: Secondary | ICD-10-CM

## 2018-07-16 DIAGNOSIS — I25118 Atherosclerotic heart disease of native coronary artery with other forms of angina pectoris: Secondary | ICD-10-CM

## 2018-07-16 DIAGNOSIS — N183 Chronic kidney disease, stage 3 (moderate): Secondary | ICD-10-CM

## 2018-07-16 DIAGNOSIS — I480 Paroxysmal atrial fibrillation: Secondary | ICD-10-CM

## 2018-07-16 DIAGNOSIS — I5032 Chronic diastolic (congestive) heart failure: Secondary | ICD-10-CM

## 2018-07-16 DIAGNOSIS — J9601 Acute respiratory failure with hypoxia: Secondary | ICD-10-CM

## 2018-07-16 DIAGNOSIS — F419 Anxiety disorder, unspecified: Secondary | ICD-10-CM

## 2018-07-16 DIAGNOSIS — R9431 Abnormal electrocardiogram [ECG] [EKG]: Secondary | ICD-10-CM

## 2018-07-16 DIAGNOSIS — E039 Hypothyroidism, unspecified: Secondary | ICD-10-CM

## 2018-07-16 DIAGNOSIS — F411 Generalized anxiety disorder: Secondary | ICD-10-CM

## 2018-07-16 DIAGNOSIS — I10 Essential (primary) hypertension: Secondary | ICD-10-CM

## 2018-07-16 DIAGNOSIS — I272 Pulmonary hypertension, unspecified: Secondary | ICD-10-CM

## 2018-07-16 DIAGNOSIS — J189 Pneumonia, unspecified organism: Secondary | ICD-10-CM | POA: Diagnosis present

## 2018-07-16 LAB — COMPREHENSIVE METABOLIC PANEL
ALT: 17 U/L (ref 0–44)
AST: 25 U/L (ref 15–41)
Albumin: 2.5 g/dL — ABNORMAL LOW (ref 3.5–5.0)
Alkaline Phosphatase: 70 U/L (ref 38–126)
Anion gap: 11 (ref 5–15)
BUN: 13 mg/dL (ref 8–23)
CO2: 22 mmol/L (ref 22–32)
Calcium: 8.2 mg/dL — ABNORMAL LOW (ref 8.9–10.3)
Chloride: 103 mmol/L (ref 98–111)
Creatinine, Ser: 1.37 mg/dL — ABNORMAL HIGH (ref 0.44–1.00)
GFR calc Af Amer: 42 mL/min — ABNORMAL LOW (ref >60)
GFR calc non Af Amer: 36 mL/min — ABNORMAL LOW (ref >60)
Glucose, Bld: 113 mg/dL — ABNORMAL HIGH (ref 70–99)
Potassium: 4.6 mmol/L (ref 3.5–5.1)
Sodium: 136 mmol/L (ref 135–145)
Total Bilirubin: 0.9 mg/dL (ref 0.3–1.2)
Total Protein: 5.8 g/dL — ABNORMAL LOW (ref 6.5–8.1)

## 2018-07-16 MED ORDER — ALPRAZOLAM 0.25 MG PO TABS
0.1250 mg | ORAL_TABLET | Freq: Three times a day (TID) | ORAL | Status: DC | PRN
  Administered 2018-07-17 – 2018-07-18 (×4): 0.25 mg via ORAL
  Filled 2018-07-16 (×4): qty 1

## 2018-07-16 NOTE — Progress Notes (Signed)
PROGRESS NOTE  [name] DXI:[id number] DOB: 1935-01-09 DOA: 07/14/2018 PCP: Terald Sleeper, PA-C  Brief History   The patient is a 84 yr old woman who presented from home with a complaint of shortness of breath and cough. She carries a past medical history significant for coronary artery disease, CKD 3, atrial fibrillation, diastolic CHF, pulmonary hypertension, hypothyroidism, right colon cancer.  In the ED the patient was found to have tachypnea, hypoxia, and right pleural effusion with adjacent right basilar atelectasis vs consolidation. The patient had 1 L hazy yellow fluid removed from the right lung. She is being covered for HCAP due to her recent hospitalizations with cefepime and vancomycin. She has been tested for COVID-19 and was negative. Blood cultures have been obtained. There has been no growth.  Consultants  . Interventional Radiology  Procedures  . Right thoracentesis  Antibiotics   Anti-infectives (From admission, onward)   Start     Dose/Rate Route Frequency Ordered Stop   07/15/18 2000  vancomycin (VANCOCIN) 1,250 mg in sodium chloride 0.9 % 250 mL IVPB     1,250 mg 166.7 mL/hr over 90 Minutes Intravenous Every 24 hours 07/14/18 1823     07/14/18 1830  ceFEPIme (MAXIPIME) 2 g in sodium chloride 0.9 % 100 mL IVPB     2 g 200 mL/hr over 30 Minutes Intravenous Every 24 hours 07/14/18 1815     07/14/18 1830  vancomycin (VANCOCIN) 1,500 mg in sodium chloride 0.9 % 500 mL IVPB     1,500 mg 250 mL/hr over 120 Minutes Intravenous  Once 07/14/18 1816 07/15/18 0802    .  Marland Kitchen   Interval History/Subjective  The patient states that her breathing is improved since thoracentesis.  Objective   Vitals:  Vitals:   07/16/18 1317 07/16/18 1746  BP: 109/65 111/72  Pulse: 98 99  Resp:  (!) 21  Temp: 97.7 F (36.5 C) 97.8 F (36.6 C)  SpO2: 95% 95%    Exam:  Constitutional:  . The patient is awake, alert, and oriented x 3. No acute distress. Respiratory:  . No  wheezes, rales, or rhonchi.  . No tactile fremitus. . No increased work of breathing. Cardiovascular:  . Regular rate and rhythm. . No murmurs, ectopy, or gallups. . No LE extremity edema   . Normal pedal pulses Abdomen:  . Abdomen is soft, non-tender, non-distended. . Scaffoid . No hernias, masses, or organomegaly are appreciated . Normoactive bowel sound.s Musculoskeletal:  . No cyanosis, clubbing or edema Skin:  . No rashes, lesions, ulcers . palpation of skin: no induration or nodules Neurologic:  . CN 2-12 intact . Sensation all 4 extremities intact Psychiatric:  . Mental status o Mood, affect appropriate o Orientation to person, place, time   I have personally reviewed the following:   Today's Data  . CMP, Body fluid  - exudative by [place] criteria.  Micro Data  . Body fluid cultures, Blood cultures. .   Scheduled Meds: . amiodarone  200 mg Oral Daily  . citalopram  40 mg Oral Daily  . fluticasone  2 spray Each Nare Daily  . hydrALAZINE  50 mg Oral TID  . isosorbide mononitrate  30 mg Oral q morning - 10a  . levothyroxine  50 mcg Oral Q0600  . losartan  100 mg Oral Daily  . metoprolol succinate  12.5 mg Oral Daily  . montelukast  10 mg Oral QHS  . pantoprazole  40 mg Oral Daily  . rosuvastatin  10 mg  Oral Daily  . zolpidem  5 mg Oral QHS   Continuous Infusions: . ceFEPime (MAXIPIME) IV 2 g (07/15/18 1819)  . vancomycin 1,250 mg (07/15/18 2207)    Active Problems:   Pleural effusion Acute respiratory failure HCAP Diastolic heart failure Paroxysmal atrial fibrillation Prolonged QTc CAD Hypertension CKD 3 Anxiety Depression A & P  Acute respiratory failure: Resolved following thoracentesis. Pt is saturating in the 90's on room air.  HCAP: The patient is receiving cefepime and vancomycin due to her recent 2 admissions for pneumonia. Her pleural effusion is exudative. Possibly a parapneumonic effusion. Await cultures and cytology.   Right  Pleural Effusion: Cultures have had no growth. Fluid has been sent for cytology which is potentially important as the patient carries a diagnosis of colon mass. Possible metastatic effusion. It is exudative.  Diastolic heart failure: The patient appears euvolemic. Monitor volume status.   Paroxysmal Atrial Fibrillation: The patien's heart rate is controlled. She has been continued on her home dose of amiodarone and metoprolol. She is on apixaban for stroke prophylaxis. She is being monitored on telemetry.  Prolonged Qtc: Noted. Avoid medications that will further prolong this interval. Monitor. She is being monitored on telemetry.  CAD: The patient is being continued on her home dose of Crestor, Nitrostat, metoprolol, and losartan. She is being monitored on telemetry.  Hypertension: Blood pressures are well controlled on metoprolol, hydralazine, and losartan. Monitor.  CKD 3: The patient's creatinine is roughly at baseline at 1.37. Monitor creatinine, electrolytes, and volume status. Avoid nephrotoxic medications and hypotension.  Anxiety: Continue home doses of Xanax. Monitor.  Depression: Continue home doses of Celexa. Monitor.  I have seen and examined this patient myself. I have spent 35 minutes in her evaluation and care.  DVT prophylaxis: Apixaban Code Status: Full Code Family Communication: None present Disposition Plan: tbd   Heather Panik, DO [facility] Direct contact: see [email or web address]  7PM-7AM contact night coverage as above 07/16/2018, 6:11 PM  LOS: 1 day    LOS: 1 day

## 2018-07-17 LAB — BASIC METABOLIC PANEL
Anion gap: 12 (ref 5–15)
BUN: 15 mg/dL (ref 8–23)
CO2: 19 mmol/L — ABNORMAL LOW (ref 22–32)
Calcium: 7.6 mg/dL — ABNORMAL LOW (ref 8.9–10.3)
Chloride: 101 mmol/L (ref 98–111)
Creatinine, Ser: 1.2 mg/dL — ABNORMAL HIGH (ref 0.44–1.00)
GFR calc Af Amer: 49 mL/min — ABNORMAL LOW (ref >60)
GFR calc non Af Amer: 42 mL/min — ABNORMAL LOW (ref >60)
Glucose, Bld: 91 mg/dL (ref 70–99)
Potassium: 4.3 mmol/L (ref 3.5–5.1)
Sodium: 132 mmol/L — ABNORMAL LOW (ref 135–145)

## 2018-07-17 LAB — CBC WITH DIFFERENTIAL/PLATELET
Abs Immature Granulocytes: 0.04 10*3/uL (ref 0.00–0.07)
Basophils Absolute: 0 10*3/uL (ref 0.0–0.1)
Basophils Relative: 0 %
Eosinophils Absolute: 0 10*3/uL (ref 0.0–0.5)
Eosinophils Relative: 0 %
HCT: 30.2 % — ABNORMAL LOW (ref 36.0–46.0)
Hemoglobin: 9.1 g/dL — ABNORMAL LOW (ref 12.0–15.0)
Immature Granulocytes: 0 %
Lymphocytes Relative: 20 %
Lymphs Abs: 1.9 10*3/uL (ref 0.7–4.0)
MCH: 25.3 pg — ABNORMAL LOW (ref 26.0–34.0)
MCHC: 30.1 g/dL (ref 30.0–36.0)
MCV: 84.1 fL (ref 80.0–100.0)
Monocytes Absolute: 0.7 10*3/uL (ref 0.1–1.0)
Monocytes Relative: 7 %
Neutro Abs: 6.8 10*3/uL (ref 1.7–7.7)
Neutrophils Relative %: 73 %
Platelets: 317 10*3/uL (ref 150–400)
RBC: 3.59 MIL/uL — ABNORMAL LOW (ref 3.87–5.11)
RDW: 17.5 % — ABNORMAL HIGH (ref 11.5–15.5)
WBC: 9.5 10*3/uL (ref 4.0–10.5)
nRBC: 0 % (ref 0.0–0.2)

## 2018-07-17 MED ORDER — LORAZEPAM 2 MG/ML IJ SOLN
0.5000 mg | Freq: Once | INTRAMUSCULAR | Status: AC
  Administered 2018-07-17: 0.5 mg via INTRAVENOUS
  Filled 2018-07-17: qty 1

## 2018-07-17 MED ORDER — APIXABAN 5 MG PO TABS
5.0000 mg | ORAL_TABLET | Freq: Two times a day (BID) | ORAL | Status: DC
  Administered 2018-07-17 – 2018-07-18 (×3): 5 mg via ORAL
  Filled 2018-07-17 (×3): qty 1

## 2018-07-17 NOTE — Discharge Instructions (Signed)

## 2018-07-17 NOTE — Progress Notes (Signed)
Pharmacy Antibiotic Note  Hunter I Brazel is a 84 y.o. female admitted on 07/14/2018 with pneumonia.  Pharmacy has been consulted for Vancomycin and Cefepime dosing.  Plan: Vancomycin 1,250 mg IV every 24 hours.  Goal trough 15-20 mcg/mL.  Cefepime 2g IV every 24 hours. Monitor labs, c/s, and vanco levels as indicated.   Height: 5\' 2"  (157.5 cm) Weight: 160 lb 15 oz (73 kg) IBW/kg (Calculated) : 50.1  Temp (24hrs), Avg:98 F (36.7 C), Min:97.8 F (36.6 C), Max:98.4 F (36.9 C)  Recent Labs  Lab 07/14/18 1449 07/14/18 1512 07/14/18 1621 07/15/18 0125 07/16/18 0922 07/17/18 0525  WBC 9.6  --   --  8.5  --  9.5  CREATININE  --  1.13*  --  1.21* 1.37* 1.20*  LATICACIDVEN  --  1.6 1.0  --   --   --     Estimated Creatinine Clearance: 34.4 mL/min (A) (by C-G formula based on SCr of 1.2 mg/dL (H)).    Allergies  Allergen Reactions  . Sulfamethoxazole Rash    Antimicrobials this admission: Vanco 4/14 >>  Cefepime 4/14 >>   Dose adjustments this admission: Vanco/Cefepime  Microbiology results: 4/14 BCx: ngtd 4/14 COVID: negative  4/15 Pleural fluid cx: ngtd  Thank you for allowing pharmacy to be a part of this patient's care.  Reginia Naas 07/17/2018 3:18 PM

## 2018-07-17 NOTE — Progress Notes (Signed)
PROGRESS NOTE  [name] OVZ:[id number] DOB: September 13, 1934 DOA: 07/14/2018 PCP: Terald Sleeper, PA-C  Brief History   The patient is a 84 yr old woman who presented from home with a complaint of shortness of breath and cough. She carries a past medical history significant for coronary artery disease, CKD 3, atrial fibrillation, diastolic CHF, pulmonary hypertension, hypothyroidism, right colon cancer.  In the ED the patient was found to have tachypnea, hypoxia, and right pleural effusion with adjacent right basilar atelectasis vs consolidation. The patient had 1 L hazy yellow fluid removed from the right lung. She is being covered for HCAP due to her recent hospitalizations with cefepime and vancomycin. She has been tested for COVID-19 and was negative. Blood cultures have been obtained. There has been no growth. Cytology is pending.  Consultants  . Interventional Radiology . General Surgery - Follow up on [place] with [place], TOUPETFUNDOPLICATION, and LYSIS OF ADHESIONS X3HOURS . EGD in March of 2020  Procedures  . Right thoracentesis  Antibiotics   Anti-infectives (From admission, onward)   Start     Dose/Rate Route Frequency Ordered Stop   07/15/18 2000  vancomycin (VANCOCIN) 1,250 mg in sodium chloride 0.9 % 250 mL IVPB     1,250 mg 166.7 mL/hr over 90 Minutes Intravenous Every 24 hours 07/14/18 1823     07/14/18 1830  ceFEPIme (MAXIPIME) 2 g in sodium chloride 0.9 % 100 mL IVPB     2 g 200 mL/hr over 30 Minutes Intravenous Every 24 hours 07/14/18 1815     07/14/18 1830  vancomycin (VANCOCIN) 1,500 mg in sodium chloride 0.9 % 500 mL IVPB     1,500 mg 250 mL/hr over 120 Minutes Intravenous  Once 07/14/18 1816 07/15/18 0802     .   Interval History/Subjective  The patient states that her breathing is improved since thoracentesis. Today she is complaining of nausea and slight shortness of breath.  Objective    Vitals:  Vitals:   07/17/18 0504 07/17/18 0900  BP: 116/70 112/71  Pulse: 90 95  Resp: 19   Temp: 98.4 F (36.9 C) 98.1 F (36.7 C)  SpO2: 97% 98%    Exam:  Constitutional:  . The patient is awake, alert, and oriented x 3. No acute distress. Respiratory:  . No wheezes, rales, or rhonchi.  . No tactile fremitus. . No increased work of breathing. Cardiovascular:  . Regular rate and rhythm. . No murmurs, ectopy, or gallups. . No LE extremity edema   . Normal pedal pulses Abdomen:  . Abdomen is soft, non-tender, non-distended. . Scaffoid . No hernias, masses, or organomegaly are appreciated . Normoactive bowel sound.s Musculoskeletal:  . No cyanosis, clubbing or edema Skin:  . No rashes, lesions, ulcers . palpation of skin: no induration or nodules Neurologic:  . CN 2-12 intact . Sensation all 4 extremities intact Psychiatric:  . Mental status o Mood, affect appropriate o Orientation to person, place, time   I have personally reviewed the following:   Today's Data  . BMP, Vitals  Micro Data  . Body fluid cultures, Blood cultures - no growth to date. . Cytology - Pending.  Scheduled Meds: . amiodarone  200 mg Oral Daily  . apixaban  5 mg Oral BID  . citalopram  40 mg Oral Daily  . fluticasone  2 spray Each Nare Daily  . hydrALAZINE  50 mg Oral TID  . isosorbide mononitrate  30 mg Oral q morning -  10a  . levothyroxine  50 mcg Oral Q0600  . losartan  100 mg Oral Daily  . metoprolol succinate  12.5 mg Oral Daily  . montelukast  10 mg Oral QHS  . pantoprazole  40 mg Oral Daily  . rosuvastatin  10 mg Oral Daily  . zolpidem  5 mg Oral QHS   Continuous Infusions: . ceFEPime (MAXIPIME) IV 2 g (07/16/18 1838)  . vancomycin 1,250 mg (07/16/18 2008)    Active Problems:   Coronary artery disease   Prolonged QT interval   CKD (chronic kidney disease) stage 3, GFR 30-59 ml/min (HCC)   AF (paroxysmal atrial fibrillation) (HCC)   Anxiety and depression    Essential hypertension   Chronic diastolic CHF (congestive heart failure) (HCC)   Cancer of right colon (HCC)   Acquired hypothyroidism   Major depressive disorder, single episode, unspecified   Pulmonary hypertension (HCC)   Anxiety state   Pleural effusion   HCAP (healthcare-associated pneumonia) Acute respiratory failure HCAP Diastolic heart failure Paroxysmal atrial fibrillation Prolonged QTc CAD Hypertension CKD 3 Anxiety Depression A & P  Acute respiratory failure: Resolved following thoracentesis. Pt is saturating in the high 90's on 2 liters today. Wean as possible.  HCAP: The patient is receiving cefepime and vancomycin due to her recent 2 admissions for pneumonia. Her pleural effusion is exudative. Possibly a parapneumonic effusion. Await cultures and cytology.   Right Pleural Effusion: Cultures have had no growth. Fluid has been sent for cytology which is potentially important as the patient carries a diagnosis of colon mass. Possible metastatic effusion. It is exudative.  Diastolic heart failure: The patient appears euvolemic. Monitor volume status.   Paroxysmal Atrial Fibrillation: The patien's heart rate is controlled. She has been continued on her home dose of amiodarone and metoprolol. She is on apixaban for stroke prophylaxis. She is being monitored on telemetry.  Prolonged Qtc: Noted. Avoid medications that will further prolong this interval. Monitor. She is being monitored on telemetry.  CAD: The patient is being continued on her home dose of Crestor, Nitrostat, metoprolol, and losartan. She is being monitored on telemetry.  Hypertension: Blood pressures are well controlled on metoprolol, hydralazine, and losartan. Monitor.  CKD 3: The patient's creatinine is roughly at baseline at 1.37. Monitor creatinine, electrolytes, and volume status. Avoid nephrotoxic medications and hypotension.  Anxiety: Continue home doses of Xanax. Monitor.  Depression: Continue  home doses of Celexa. Monitor.  S/P Recent XI ROBOTIC REPAIR PARASOPHAGEAL HIATAL HERNIA ABSORBABLEMESH REINFORCEMENT TOUPETFUNDOPLICATION LYSIS OF ADHESIONS X3HOURS and EGD in March of 05/2018. Follow up by General Surgery is appreciated.  I have seen and examined this patient myself. I have spent 32 minutes in her evaluation and care.  DVT prophylaxis: Apixaban Code Status: Full Code Family Communication: None present Disposition Plan: tbd   Glyndon Tursi, DO [facility] Direct contact: see [email or web address]  7PM-7AM contact night coverage as above 07/16/2018, 6:11 PM  LOS: 1 day    LOS: 2 days

## 2018-07-17 NOTE — Care Management Important Message (Signed)
Important Message  Patient Details  Name: [name] MRN: [id number] Date of Birth: 12-Apr-1934   Medicare Important Message Given:  Yes    [name] 07/17/2018, 3:35 PM

## 2018-07-17 NOTE — Progress Notes (Signed)
[name] [id number] 08/27/1934  CARE TEAM:  PCP: Terald Sleeper, PA-C  Outpatient Care Team: Patient Care Team: [name] as PCP - General (General Practice) Herminio Commons, MD as PCP - Cardiology (Cardiology) Okey Regal, [place] (Optometry) Herminio Commons, MD as Attending Physician (Cardiology) Truitt Merle, MD as Consulting Physician (Hematology) Leighton Ruff, MD as Consulting Physician (Colon and Rectal Surgery) Michael Boston, MD as Consulting Physician (General Surgery) Pyrtle, Lajuan Lines, MD as Consulting Physician (Gastroenterology) Fran Lowes, MD as Consulting Physician (Nephrology)  Inpatient Treatment Team: Treatment Team: Attending Provider: Karie Kirks, DO; Consulting Physician: Michael Boston, MD; Technician: Letitia Caul, NT; Rounding Team: Trenton Gammon, MD; [place] Manager: Bartholomew Crews, RN; Registered Nurse: Haywood Lasso, RN; Social Worker: Faye Ramsay, LCSW   Problem List:   Active Problems:   Coronary artery disease   Prolonged QT interval   CKD (chronic kidney disease) stage 3, GFR 30-59 ml/min (HCC)   AF (paroxysmal atrial fibrillation) (HCC)   Anxiety and depression   Essential hypertension   Chronic diastolic CHF (congestive heart failure) ([place])   Cancer of right colon ([place])   Acquired hypothyroidism   Major depressive disorder, single episode, unspecified   Pulmonary hypertension ([place])   Anxiety state   Pleural effusion   HCAP (healthcare-associated pneumonia)      06/05/2018  POST-OPERATIVE DIAGNOSIS:  Incarcerated, recurrent Hiatal Hernia with organoaxial volvulus  PROCEDURE:   XI ROBOTIC REPAIR PARASOPHAGEAL HIATAL HERNIA ABSORBABLE MESH REINFORCEMENT TOUPET FUNDOPLICATION LYSIS OF ADHESIONS X 3 HOURS EGD  SURGEON:  Adin Hector, MD   Assessment  Recovering  [facility] Stay = 2 days)  Plan:  No evidence of pneumonia or empyema so far is reassuring.  Waiting final cultures.   Improving with thoracentesis and continue diuresis.  No evidence of recurrent hiatal hernia, intractable dysphasia, leak.  Suspect nausea related to her feeling sickly with the effusion as she was feeling great last week.  She can follow-up with me as needed at this point since she is outside the window of new complications related to her surgery.  Hopefully her cardiopulmonary issues will improve the further out she gets.  She is motivated to try and be more active.  20 minutes spent in review, evaluation, examination, counseling, and coordination of care.  More than 50% of that time was spent in counseling.  07/17/2018    Subjective: (Chief complaint)  Feeling much better overall.  Has been having some nausea while she has been feeling short of breath.  Zofran helps.  She is tolerating soft foods without difficulty.  No dysphagia.  No heartburn or reflux.  No retching.  Wants to go home soon.  Objective:  Vital signs:  Vitals:   07/16/18 1317 07/16/18 1746 07/16/18 2107 07/17/18 0504  BP: 109/65 111/72 120/74 116/70  Pulse: 98 99 93 90  Resp:  (!) 21 20 19   Temp: 97.7 F (36.5 C) 97.8 F (36.6 C) 97.8 F (36.6 C) 98.4 F (36.9 C)  TempSrc: Oral Oral Oral Oral  SpO2: 95% 95% 99% 97%  Weight:   73 kg   Height:        Last BM Date: 07/15/18  Intake/Output   Yesterday:  04/16 0701 - 04/17 0700 In: 0  Out: 300 [Urine:300] This shift:  No intake/output data recorded.  Bowel function:  Flatus: YES  BM:  YES  Drain: Serous   Physical Exam:  General: Pt sleeping without tachypnea.  She awakens  alert & oriented x4 in no acute distress.  Smiling.  Her usual sharp-witted self Eyes: PERRL, normal EOM.  Sclera clear.  No icterus Neuro: CN II-XII intact w/o focal sensory/motor deficits. Lymph: No head/neck/groin lymphadenopathy Psych:  No delerium/psychosis/paranoia HENT: Normocephalic, Mucus membranes moist.  No thrush Neck: Supple, No tracheal deviation Chest:  No chest wall pain w good excursion CV:  Pulses intact.  Regular rhythm MS: Normal AROM mjr joints.  No obvious deformity  Abdomen: Soft.  Nondistended.  Nontender.  Permanent end colostomy pink with stool in bag.  No evidence of peritonitis.  No incarcerated hernias.  Ext:  No deformity.  No mjr edema.  No cyanosis Skin: No petechiae / purpura  Results:       Labs: Results for orders placed or performed during the hospital encounter of 07/14/18 (from the past 48 hour(s))  Lactate dehydrogenase (pleural or peritoneal fluid)     Status: Abnormal   Collection Time: 07/15/18 10:27 AM  Result Value Ref Range   LD, Fluid 100 (H) 3 - 23 U/L    Comment: (NOTE) Results should be evaluated in conjunction with serum values    Fluid Type-FLDH Pleural R     Comment: Performed at [facility] [address], [place], [place] [address]  Body fluid cell count with differential     Status: Abnormal   Collection Time: 07/15/18 10:27 AM  Result Value Ref Range   Fluid Type-FCT Pleural R    Color, Fluid AMBER    Appearance, Fluid CLOUDY (A) CLEAR   WBC, Fluid 121 0 - 1,000 cu mm   Neutrophil Count, Fluid 5 0 - 25 %   Lymphs, Fluid 79 %   Monocyte-Macrophage-Serous Fluid 16 (L) 50 - 90 %   Eos, Fluid 0 %    Comment: Performed at [facility] [address] [place], [place] [address]  Gram stain     Status: None   Collection Time: 07/15/18 10:27 AM  Result Value Ref Range   Specimen Description PLEURAL RIGHT    Special Requests NONE    Gram Stain      RARE WBC PRESENT, PREDOMINANTLY MONONUCLEAR NO ORGANISMS SEEN Performed at [facility], [place] [address] [place], [place] [address]    Report Status 07/15/2018 FINAL   Albumin, pleural or peritoneal fluid     Status: None   Collection Time: 07/15/18 10:27 AM  Result Value Ref Range   Albumin, Fluid 1.6 g/dL    Comment: (NOTE) No normal range established for this test Results should be evaluated in conjunction with  serum values    Fluid Type-FALB Pleural R     Comment: Performed at [facility] [address], [place], [place] [address]  Glucose, pleural or peritoneal fluid     Status: None   Collection Time: 07/15/18 10:27 AM  Result Value Ref Range   Glucose, Fluid 103 mg/dL    Comment: (NOTE) No normal range established for this test Results should be evaluated in conjunction with serum values    Fluid Type-FGLU Pleural R     Comment: Performed at [facility] [address], [place],  [address]  Culture, body fluid-bottle     Status: None (Preliminary result)   Collection Time: 07/15/18 10:27 AM  Result Value Ref Range   Specimen Description PLEURAL RIGHT    Special Requests NONE    Culture      NO GROWTH 2 DAYS Performed  at [facility], [place] [address], [place], [place] [address]    Report Status PENDING   Comprehensive metabolic panel     Status: Abnormal   Collection Time: 07/16/18  9:22 AM  Result Value Ref Range   Sodium 136 135 - 145 mmol/L   Potassium 4.6 3.5 - 5.1 mmol/L   Chloride 103 98 - 111 mmol/L   CO2 22 22 - 32 mmol/L   Glucose, Bld 113 (H) 70 - 99 mg/dL   BUN 13 8 - 23 mg/dL   Creatinine, Ser 1.37 (H) 0.44 - 1.00 mg/dL   Calcium 8.2 (L) 8.9 - 10.3 mg/dL   Total Protein 5.8 (L) 6.5 - 8.1 g/dL   Albumin 2.5 (L) 3.5 - 5.0 g/dL   AST 25 15 - 41 U/L   ALT 17 0 - 44 U/L   Alkaline Phosphatase 70 38 - 126 U/L   Total Bilirubin 0.9 0.3 - 1.2 mg/dL   GFR calc non Af Amer 36 (L) >60 mL/min   GFR calc Af Amer 42 (L) >60 mL/min   Anion gap 11 5 - 15    Comment: Performed at [facility], [place] [address] [place], [place] [address]  CBC with Differential/Platelet     Status: Abnormal   Collection Time: 07/17/18  5:25 AM  Result Value Ref Range   WBC 9.5 4.0 - 10.5 K/uL   RBC 3.59 (L) 3.87 - 5.11 MIL/uL   Hemoglobin 9.1 (L) 12.0 - 15.0 g/dL   HCT 30.2 (L) 36.0 - 46.0 %   MCV 84.1 80.0 - 100.0 fL   MCH 25.3 (L) 26.0 - 34.0 pg   MCHC 30.1  30.0 - 36.0 g/dL   RDW 17.5 (H) 11.5 - 15.5 %   Platelets 317 150 - 400 K/uL   nRBC 0.0 0.0 - 0.2 %   Neutrophils Relative % 73 %   Neutro Abs 6.8 1.7 - 7.7 K/uL   Lymphocytes Relative 20 %   Lymphs Abs 1.9 0.7 - 4.0 K/uL   Monocytes Relative 7 %   Monocytes Absolute 0.7 0.1 - 1.0 K/uL   Eosinophils Relative 0 %   Eosinophils Absolute 0.0 0.0 - 0.5 K/uL   Basophils Relative 0 %   Basophils Absolute 0.0 0.0 - 0.1 K/uL   Immature Granulocytes 0 %   Abs Immature Granulocytes 0.04 0.00 - 0.07 K/uL    Comment: Performed at [facility], [place] [address], [place], [place] [address]  Basic metabolic panel     Status: Abnormal   Collection Time: 07/17/18  5:25 AM  Result Value Ref Range   Sodium 132 (L) 135 - 145 mmol/L   Potassium 4.3 3.5 - 5.1 mmol/L   Chloride 101 98 - 111 mmol/L   CO2 19 (L) 22 - 32 mmol/L   Glucose, Bld 91 70 - 99 mg/dL   BUN 15 8 - 23 mg/dL   Creatinine, Ser 1.20 (H) 0.44 - 1.00 mg/dL   Calcium 7.6 (L) 8.9 - 10.3 mg/dL   GFR calc non Af Amer 42 (L) >60 mL/min   GFR calc Af Amer 49 (L) >60 mL/min   Anion gap 12 5 - 15    Comment: Performed at [facility] [address], [place], [place] [address]    Imaging / Studies: Dg Chest 1 View  Result Date: 07/15/2018 CLINICAL DATA:  Followup right thoracentesis EXAM: CHEST  1 VIEW COMPARISON:  07/14/2018 FINDINGS: There is less pleural  fluid on the right with better aeration of the right lung. Small to moderate effusion does persist. There is a small effusion on the left with left base atelectasis. IMPRESSION: Reduction in amount of pleural fluid on the right with better aeration of the right lung. No visible pneumothorax. Small to moderate right effusion does persist with right base volume loss. Small effusion persists on the left. Electronically Signed   By: Nelson Chimes M.D.   On: 07/15/2018 10:52   Ir Thoracentesis Asp Pleural Space W/img Guide  Result Date: 07/15/2018 INDICATION: Patient with shortness  of breath, right pleural effusion. Request made for diagnostic and therapeutic thoracentesis. EXAM: ULTRASOUND GUIDED RIGHT THORACENTESIS MEDICATIONS: 10 mL 1% lidocaine COMPLICATIONS: None immediate. PROCEDURE: An ultrasound guided thoracentesis was thoroughly discussed with the patient and questions answered. The benefits, risks, alternatives and complications were also discussed. The patient understands and wishes to proceed with the procedure. Written consent was obtained. Ultrasound was performed to localize and mark an adequate pocket of fluid in the right chest. The area was then prepped and draped in the normal sterile fashion. 1% Lidocaine was used for local anesthesia. Under ultrasound guidance a 6 Fr Safe-T-Centesis catheter was introduced. Thoracentesis was performed. The catheter was removed and a dressing applied. FINDINGS: A total of approximately 1.0 liters of dark, yellow fluid was removed. Samples were sent to the laboratory as requested by the clinical team. IMPRESSION: Successful ultrasound guided diagnostic and therapeutic thoracentesis yielding 1.0 of pleural fluid. Read by: Brynda Greathouse PA-C Electronically Signed   By: Markus Daft M.D.   On: 07/15/2018 11:36    Medications / Allergies: per chart  Antibiotics: Anti-infectives (From admission, onward)   Start     Dose/Rate Route Frequency Ordered Stop   07/15/18 2000  vancomycin (VANCOCIN) 1,250 mg in sodium chloride 0.9 % 250 mL IVPB     1,250 mg 166.7 mL/hr over 90 Minutes Intravenous Every 24 hours 07/14/18 1823     07/14/18 1830  ceFEPIme (MAXIPIME) 2 g in sodium chloride 0.9 % 100 mL IVPB     2 g 200 mL/hr over 30 Minutes Intravenous Every 24 hours 07/14/18 1815     07/14/18 1830  vancomycin (VANCOCIN) 1,500 mg in sodium chloride 0.9 % 500 mL IVPB     1,500 mg 250 mL/hr over 120 Minutes Intravenous  Once 07/14/18 1816 07/15/18 0802        Note: Portions of this report may have been transcribed using voice recognition  software. Every effort was made to ensure accuracy; however, inadvertent computerized transcription errors may be present.   Any transcriptional errors that result from this process are unintentional.     Adin Hector, MD, FACS, MASCRS Gastrointestinal and Minimally Invasive Surgery    [address] [place], [place] [address] [phone] Main / Paging [phone] Fax

## 2018-07-18 LAB — CBC WITH DIFFERENTIAL/PLATELET
Abs Immature Granulocytes: 0.07 10*3/uL (ref 0.00–0.07)
Basophils Absolute: 0 10*3/uL (ref 0.0–0.1)
Basophils Relative: 0 %
Eosinophils Absolute: 0 10*3/uL (ref 0.0–0.5)
Eosinophils Relative: 0 %
HCT: 29.4 % — ABNORMAL LOW (ref 36.0–46.0)
Hemoglobin: 8.6 g/dL — ABNORMAL LOW (ref 12.0–15.0)
Immature Granulocytes: 1 %
Lymphocytes Relative: 12 %
Lymphs Abs: 1.2 10*3/uL (ref 0.7–4.0)
MCH: 24.4 pg — ABNORMAL LOW (ref 26.0–34.0)
MCHC: 29.3 g/dL — ABNORMAL LOW (ref 30.0–36.0)
MCV: 83.5 fL (ref 80.0–100.0)
Monocytes Absolute: 0.7 10*3/uL (ref 0.1–1.0)
Monocytes Relative: 7 %
Neutro Abs: 7.9 10*3/uL — ABNORMAL HIGH (ref 1.7–7.7)
Neutrophils Relative %: 80 %
Platelets: 299 10*3/uL (ref 150–400)
RBC: 3.52 MIL/uL — ABNORMAL LOW (ref 3.87–5.11)
RDW: 17.4 % — ABNORMAL HIGH (ref 11.5–15.5)
WBC: 10 10*3/uL (ref 4.0–10.5)
nRBC: 0 % (ref 0.0–0.2)

## 2018-07-18 LAB — BASIC METABOLIC PANEL
Anion gap: 10 (ref 5–15)
BUN: 15 mg/dL (ref 8–23)
CO2: 24 mmol/L (ref 22–32)
Calcium: 8 mg/dL — ABNORMAL LOW (ref 8.9–10.3)
Chloride: 101 mmol/L (ref 98–111)
Creatinine, Ser: 1.15 mg/dL — ABNORMAL HIGH (ref 0.44–1.00)
GFR calc Af Amer: 52 mL/min — ABNORMAL LOW (ref >60)
GFR calc non Af Amer: 45 mL/min — ABNORMAL LOW (ref >60)
Glucose, Bld: 99 mg/dL (ref 70–99)
Potassium: 4.9 mmol/L (ref 3.5–5.1)
Sodium: 135 mmol/L (ref 135–145)

## 2018-07-18 MED ORDER — ACETAMINOPHEN 325 MG PO TABS: 650.0000 mg | ORAL_TABLET | Freq: Four times a day (QID) | ORAL | 0 refills | Status: AC | PRN

## 2018-07-18 MED ORDER — AMIODARONE HCL 200 MG PO TABS: 200.0000 mg | ORAL_TABLET | Freq: Every day | ORAL | 0 refills | Status: AC

## 2018-07-18 MED ORDER — METOPROLOL SUCCINATE ER 25 MG PO TB24: 12.5000 mg | ORAL_TABLET | Freq: Every day | ORAL | 0 refills | Status: AC

## 2018-07-18 NOTE — Progress Notes (Signed)
SATURATION QUALIFICATIONS: (This note is used to comply with regulatory documentation for home oxygen)  Patient Saturations on Room Air at Rest = 90%  Patient Saturations on Room Air while Ambulating = 85%  Patient Saturations on 2 Liters of oxygen while Ambulating = 97%  Please briefly explain why patient needs home oxygen: Pt oxygen drops when ambulating.

## 2018-07-18 NOTE — Progress Notes (Signed)
DISCHARGE NOTE HOME [name] to be discharged Home with [facility] per MD order. Discussed prescriptions and follow up appointments with the patient. Prescriptions given to patient; medication list explained in detail. Patient verbalized understanding.  Skin clean, dry and intact without evidence of skin break down, no evidence of skin tears noted. IV catheter discontinued intact. Site without signs and symptoms of complications. Dressing and pressure applied. Pt denies pain at the site currently. No complaints noted.  Patient free of lines, drains, and wounds.  Home DME oxygen was sent with pt prior to DC.  An After Visit Summary (AVS) was printed and given to the patient. Patient escorted via wheelchair, and discharged home via private auto.  Orville Govern, RN

## 2018-07-18 NOTE — Care Management (Signed)
Requested home oxygen to delivered to room through [employer] prior to DC. Notified [place] that patient will DC today. No other CM needs idenified

## 2018-07-18 NOTE — Discharge Summary (Signed)
Physician Discharge Summary  [name] CHE:[id number] DOB: Mar 16, 1935 DOA: 07/14/2018  PCP: Terald Sleeper, PA-C  Admit date: 07/14/2018 Discharge date: 07/18/2018  Recommendations for Outpatient Follow-up:  1. Follow up with PCP in 7-10 days.  Follow-up Information    [facility] Follow up.   Specialty:  Home Health Services Why:  HHRN,HHPT, HHOT Contact information: [address] [place] [address] [phone]            Discharge Diagnoses: Principal diagnosis is #1 Coronary artery disease   Prolonged QT interval   CKD (chronic kidney disease) stage 3, GFR 30-59 ml/min (HCC)   AF (paroxysmal atrial fibrillation) (HCC)   Anxiety and depression   Essential hypertension   Chronic diastolic CHF (congestive heart failure) ([place])   Cancer of right colon (HCC)   Acquired hypothyroidism   Major depressive disorder, single episode, unspecified   Pulmonary hypertension (HCC)   Anxiety state   Pleural effusion   HCAP (healthcare-associated pneumonia) Acute respiratory failure HCAP Diastolic heart failure Paroxysmal atrial fibrillation Prolonged QTc CAD Hypertension CKD 3 Anxiety Depression  Discharge Condition: Fair Disposition: Home  Diet recommendation: Heart healthy with 2 gm sodium restriction.  Filed Weights   07/15/18 2104 07/16/18 2107 07/18/18 0506  Weight: 68.3 kg 73 kg 67 kg    History of present illness:  [name] is a 84 y.o. female with medical history significant for coronary artery disease, CKD 3, atrial fibrillation, diastolic CHF, pulmonary hypertension, hypothyroidism, right colon cancer.  Who presented to the ED with complaints of increasing difficulty breathing and cough productive of whitish sputum over the past month-since she was discharged from the hospital. She denies fever or chills, no sore throat, no myalgias, no sick contacts, she has been home over the past few weeks.  No leg swelling.  No chest pain. Patient's  home health RN evaluated patient at home, compelled patient to come to the ED-as her difficulty breathing was worse O2 sats 92% while sitting dropped to 80s on standing. A week ago patient declined coming to the hospital due to fear of COVID infection, but per chat- Last Thursday, she did not go to the ED, the EMS gave her O2 and discouraged her from going to ED d/t COVID-19  Recent hospitalization 3/15 to 3/18- for generalized weakness thought type factorial, secondary to paroxysmal A. fib with RVR and AKI.  Discharged to home after patient is compliant SNF discharge.  Plans for TEE cardioversion on admission canceled as patient converted to sinus rhythm.  Hospitalization to 3/6- 06/11/2018-for incarcerated recurrent hiatal hernia, underwent robotic repair 06/05/2018.  Stay complicated by postop A. Fib, marked elevated troponin, 13.58, asymptomatic, evaluated by cardiology, did not feel compelled to pursue invasive cardiac cath.  Started on amiodarone and Eliquis.    ED Course: Tachypneic to 27, O2 sats at the time of evaluation 90% on room air.  With ambulation in the ED O2 sats dropped to 88%. WBC 9.6, without lymphopenia.  Mild lactic acid 1.  Sodium 131. Cr-1.13.  Stable chest x-[place] showed enlarging now moderate right pleural effusion with adjacent right basilar atelectasis versus consolidation, unchanged small left pleural effusion.  Hospitalist called to admit for pleural effusion.  Hospital Course:  In the ED the patient was found to have tachypnea, hypoxia, and right pleural effusion with adjacent right basilar atelectasis vs consolidation. The patient had 1 L hazy yellow fluid removed from the right lung. She is being covered for HCAP due to  her recent hospitalizations with cefepime and vancomycin. She has been tested for COVID-19 and was negative. Blood cultures have been obtained. There has been no growth. Cytology is negative for malignant cells. Her Oxygen requirements have been weaned down to  2L. She will be discharged to home as she continues to desaturate to the mid-80's on room air with ambulation.  Today's assessment: S:  The patient is resting comfortably. She badly wants to go home. O: Vitals:  Vitals:   07/18/18 0506 07/18/18 0910  BP: 128/76 128/76  Pulse: 92 90  Resp: 18 18  Temp: 97.8 F (36.6 C) 97.7 F (36.5 C)  SpO2:  98%    Constitutional: The patient is awake, alert, and oriented x 3. No acute distress. Respiratory:   No wheezes, rales, or rhonchi.   No tactile fremitus.  No increased work of breathing. Cardiovascular:   Regular rate and rhythm.  No murmurs, ectopy, or gallups.  No LE extremity edema    Normal pedal pulses Abdomen:   Abdomen is soft, non-tender, non-distended.  Scaffoid  No hernias, masses, or organomegaly are appreciated  Normoactive bowel sound.s Musculoskeletal:   No cyanosis, clubbing or edema Skin:   No rashes, lesions, ulcers  palpation of skin: no induration or nodules Neurologic:   CN 2-12 intact  Sensation all 4 extremities intact Psychiatric:   Mental status ? Mood, affect appropriate ? Orientation to person, place, time    Discharge Instructions  Discharge Instructions    Activity as tolerated - No restrictions   Complete by:  As directed    Call MD for:  difficulty breathing, headache or visual disturbances   Complete by:  As directed    Diet - low sodium heart healthy   Complete by:  As directed    Discharge instructions   Complete by:  As directed    Follow up with PCP in 7-10 days.   Increase activity slowly   Complete by:  As directed      Allergies as of 07/18/2018      Reactions   Sulfamethoxazole Rash      Medication List    TAKE these medications   acetaminophen 325 MG tablet Commonly known as:  TYLENOL Take 2 tablets (650 mg total) by mouth every 6 (six) hours as needed for mild pain (or Fever >/= 101).   albuterol 108 (90 Base) MCG/ACT inhaler Commonly known as:   VENTOLIN HFA Inhale 2 puffs into the lungs every 6 (six) hours as needed for wheezing or shortness of breath.   ALPRAZolam 0.25 MG tablet Commonly known as:  XANAX TAKEK 1/2 TO 1 TABLET 3 TIMES DAILY AS NEEDED What changed:    how much to take  how to take this  when to take this  reasons to take this  additional instructions   amiodarone 200 MG tablet Commonly known as:  PACERONE Take 1 tablet (200 mg total) by mouth daily. Start taking on:  July 19, 2018   apixaban 5 MG Tabs tablet Commonly known as:  ELIQUIS Take 1 tablet (5 mg total) by mouth 2 (two) times daily.   benzonatate 200 MG capsule Commonly known as:  TESSALON Take 1 capsule (200 mg total) by mouth 2 (two) times daily as needed for cough.   citalopram 40 MG tablet Commonly known as:  CELEXA TAKE ONE (1) TABLET EACH DAY What changed:    how much to take  how to take this  when to take this  additional instructions  denosumab 60 MG/ML Sosy injection Commonly known as:  PROLIA Inject 60 mg into the skin every 6 (six) months.   esomeprazole 40 MG capsule Commonly known as:  NEXIUM TAKE ONE (1) CAPSULE EACH DAY What changed:    how much to take  how to take this  when to take this  additional instructions   ferrous sulfate 325 (65 FE) MG EC tablet Take 325 mg by mouth daily with breakfast.   fluticasone 50 MCG/ACT nasal spray Commonly known as:  FLONASE USE 2 SPRAYS IN EACH NOSTRIL DAILY   hydrALAZINE 50 MG tablet Commonly known as:  APRESOLINE TAKE ONE (1) TABLET THREE (3) TIMES EACH DAY What changed:    how much to take  how to take this  when to take this  additional instructions   HYDROcodone-homatropine 5-1.5 MG/5ML syrup Commonly known as:  HYCODAN Take 5 mLs by mouth every 8 (eight) hours as needed for cough.   isosorbide mononitrate 30 MG 24 hr tablet Commonly known as:  IMDUR Take 1 tablet (30 mg total) by mouth every morning.   levothyroxine 50 MCG  tablet Commonly known as:  SYNTHROID TAKE ONE TABLET EACH MORNING BEFORE BREAKFAST What changed:    how much to take  how to take this  when to take this  additional instructions   loratadine 10 MG tablet Commonly known as:  CLARITIN Take 1 tablet (10 mg total) by mouth at bedtime.   losartan 100 MG tablet Commonly known as:  COZAAR TAKE ONE (1) TABLET EACH DAY What changed:    how much to take  how to take this  when to take this  additional instructions   Meclizine HCl 25 MG Chew TAKE 1 TABLET 3 TIMES DAILY AS NEEDED FOR DIZZINESS What changed:  See the new instructions.   metoprolol succinate 25 MG 24 hr tablet Commonly known as:  TOPROL-XL Take 0.5 tablets (12.5 mg total) by mouth daily. Start taking on:  July 19, 2018   montelukast 10 MG tablet Commonly known as:  SINGULAIR Take 1 tablet (10 mg total) by mouth at bedtime.   nitroGLYCERIN 0.4 MG SL tablet Commonly known as:  NITROSTAT Place 1 tablet (0.4 mg total) under the tongue every 5 (five) minutes as needed for chest pain. Reported on 04/26/2015   nystatin powder Commonly known as:  MYCOSTATIN/NYSTOP Apply topically 4 (four) times daily. What changed:    how much to take  when to take this  reasons to take this   ondansetron 4 MG disintegrating tablet Commonly known as:  ZOFRAN-ODT Take 1 tablet (4 mg total) by mouth every 6 (six) hours as needed for nausea or vomiting.   polyethylene glycol powder 17 GM/SCOOP powder Commonly known as:  GaviLAX MIX 17 GRAMS IN 80Z OF WATER/JUICE AND DRINK TWICE DAILY What changed:    how much to take  how to take this  when to take this  additional instructions   rosuvastatin 10 MG tablet Commonly known as:  CRESTOR TAKE ONE (1) TABLET EACH DAY What changed:    how much to take  how to take this  when to take this  additional instructions   SYSTANE BALANCE OP Apply 1 drop to eye daily as needed (dry eyes).   traMADol 50 MG  tablet Commonly known as:  ULTRAM Take 1 tablet (50 mg total) by mouth every 12 (twelve) hours as needed. What changed:    when to take this  reasons to take this  Vitamin D (Ergocalciferol) 1.25 MG (50000 UT) Caps capsule Commonly known as:  DRISDOL TAKE 1 CAPSULE ON TUESDAYS What changed:    how much to take  how to take this  when to take this  additional instructions   zolpidem 10 MG tablet Commonly known as:  AMBIEN Take 10 mg by mouth at bedtime.            Durable Medical Equipment  (From admission, onward)         Start     Ordered   07/18/18 1600  DME Oxygen  Once    Question Answer Comment  Mode or (Route) Nasal cannula   Liters per Minute 2   Oxygen conserving device Yes   Oxygen delivery system Gas      07/18/18 1600         Allergies  Allergen Reactions   Sulfamethoxazole Rash    The results of significant diagnostics from this hospitalization (including imaging, microbiology, ancillary and laboratory) are listed below for reference.    Significant Diagnostic Studies: Dg Chest 1 View  Result Date: 07/15/2018 CLINICAL DATA:  Followup right thoracentesis EXAM: CHEST  1 VIEW COMPARISON:  07/14/2018 FINDINGS: There is less pleural fluid on the right with better aeration of the right lung. Small to moderate effusion does persist. There is a small effusion on the left with left base atelectasis. IMPRESSION: Reduction in amount of pleural fluid on the right with better aeration of the right lung. No visible pneumothorax. Small to moderate right effusion does persist with right base volume loss. Small effusion persists on the left. Electronically Signed   By: Nelson Chimes M.D.   On: 07/15/2018 10:52   Dg Chest Portable 1 View  Result Date: 07/14/2018 CLINICAL DATA:  Chest pain and shortness of breath. EXAM: PORTABLE CHEST 1 VIEW COMPARISON:  Chest x-[place] dated June 14, 2018. FINDINGS: Stable cardiomegaly. Normal pulmonary vascularity. Increasing  now moderate right pleural effusion with right basilar opacity. Unchanged small left pleural effusion with mild left lower lobe subsegmental atelectasis. No pneumothorax. No acute osseous abnormality. IMPRESSION: 1. Enlarging now moderate right pleural effusion with adjacent right basilar atelectasis versus consolidation. 2. Unchanged small left pleural effusion. Electronically Signed   By: Titus Dubin M.D.   On: 07/14/2018 14:01   Ir Thoracentesis Asp Pleural Space W/img Guide  Result Date: 07/15/2018 INDICATION: Patient with shortness of breath, right pleural effusion. Request made for diagnostic and therapeutic thoracentesis. EXAM: ULTRASOUND GUIDED RIGHT THORACENTESIS MEDICATIONS: 10 mL 1% lidocaine COMPLICATIONS: None immediate. PROCEDURE: An ultrasound guided thoracentesis was thoroughly discussed with the patient and questions answered. The benefits, risks, alternatives and complications were also discussed. The patient understands and wishes to proceed with the procedure. Written consent was obtained. Ultrasound was performed to localize and mark an adequate pocket of fluid in the right chest. The area was then prepped and draped in the normal sterile fashion. 1% Lidocaine was used for local anesthesia. Under ultrasound guidance a 6 Fr Safe-T-Centesis catheter was introduced. Thoracentesis was performed. The catheter was removed and a dressing applied. FINDINGS: A total of approximately 1.0 liters of dark, yellow fluid was removed. Samples were sent to the laboratory as requested by the clinical team. IMPRESSION: Successful ultrasound guided diagnostic and therapeutic thoracentesis yielding 1.0 of pleural fluid. Read by: Brynda Greathouse PA-C Electronically Signed   By: Markus Daft M.D.   On: 07/15/2018 11:36    Microbiology: Recent Results (from the past 240 hour(s))  SARS Coronavirus 2 [facility]  order, Performed in [place] hospital lab)     Status: None   Collection Time: 07/14/18  1:59 AM   Result Value Ref Range Status   SARS Coronavirus 2 NEGATIVE NEGATIVE Final    Comment: (NOTE) If result is NEGATIVE SARS-CoV-2 target nucleic acids are NOT DETECTED. The SARS-CoV-2 RNA is generally detectable in upper and lower  respiratory specimens during the acute phase of infection. The lowest  concentration of SARS-CoV-2 viral copies this assay can detect is 250  copies / mL. A negative result does not preclude SARS-CoV-2 infection  and should not be used as the sole basis for treatment or other  patient management decisions.  A negative result may occur with  improper specimen collection / handling, submission of specimen other  than nasopharyngeal swab, presence of viral mutation(s) within the  areas targeted by this assay, and inadequate number of viral copies  (<250 copies / mL). A negative result must be combined with clinical  observations, patient history, and epidemiological information. If result is POSITIVE SARS-CoV-2 target nucleic acids are DETECTED. The SARS-CoV-2 RNA is generally detectable in upper and lower  respiratory specimens dur ing the acute phase of infection.  Positive  results are indicative of active infection with SARS-CoV-2.  Clinical  correlation with patient history and other diagnostic information is  necessary to determine patient infection status.  Positive results do  not rule out bacterial infection or co-infection with other viruses. If result is PRESUMPTIVE POSTIVE SARS-CoV-2 nucleic acids MAY BE PRESENT.   A presumptive positive result was obtained on the submitted specimen  and confirmed on repeat testing.  While 2019 novel coronavirus  (SARS-CoV-2) nucleic acids may be present in the submitted sample  additional confirmatory testing may be necessary for epidemiological  and / or clinical management purposes  to differentiate between  SARS-CoV-2 and other Sarbecovirus currently known to infect humans.  If clinically indicated additional  testing with an alternate test  methodology [phone]) is advised. The SARS-CoV-2 RNA is generally  detectable in upper and lower respiratory sp ecimens during the acute  phase of infection. The expected result is Negative. Fact Sheet for Patients:  StrictlyIdeas.no Fact Sheet for Healthcare Providers: BankingDealers.co.za This test is not yet approved or cleared by the [place] FDA and has been authorized for detection and/or diagnosis of SARS-CoV-2 by FDA under an Emergency Use Authorization (EUA).  This EUA will remain in effect (meaning this test can be used) for the duration of the COVID-19 declaration under Section 564(b)(1) of the Act, 21 U.S.C. section 360bbb-3(b)(1), unless the authorization is terminated or revoked sooner. Performed at [facility], [place] [address] [place], [place] [address]   Culture, blood (routine x 2)     Status: None (Preliminary result)   Collection Time: 07/14/18  6:10 PM  Result Value Ref Range Status   Specimen Description BLOOD LEFT ANTECUBITAL  Final   Special Requests   Final    BOTTLES DRAWN AEROBIC AND ANAEROBIC Blood Culture adequate volume   Culture   Final    NO GROWTH 4 DAYS Performed at [facility], [address] [place],  [address]    Report Status PENDING  Incomplete  Culture, blood (routine x 2)     Status: None (Preliminary result)   Collection Time: 07/14/18  6:10 PM  Result Value Ref Range Status   Specimen Description BLOOD BLOOD LEFT WRIST  Final   Special Requests   Final    BOTTLES DRAWN AEROBIC  AND ANAEROBIC Blood Culture adequate volume   Culture   Final    NO GROWTH 4 DAYS Performed at [facility], [address] [place], [place] [address]    Report Status PENDING  Incomplete  Gram stain     Status: None   Collection Time: 07/15/18 10:27 AM  Result Value Ref Range Status   Specimen Description PLEURAL RIGHT  Final   Special Requests NONE  Final    Gram Stain   Final    RARE WBC PRESENT, PREDOMINANTLY MONONUCLEAR NO ORGANISMS SEEN Performed at [facility], [address], [place], [place] [address]    Report Status 07/15/2018 FINAL  Final  Culture, body fluid-bottle     Status: None (Preliminary result)   Collection Time: 07/15/18 10:27 AM  Result Value Ref Range Status   Specimen Description PLEURAL RIGHT  Final   Special Requests NONE  Final   Culture   Final    NO GROWTH 3 DAYS Performed at [facility] [address], [place], [place] [address]    Report Status PENDING  Incomplete     Labs: Basic Metabolic Panel: Recent Labs  Lab 07/14/18 1512 07/15/18 0125 07/16/18 0922 07/17/18 0525 07/18/18 0443  NA 131* 134* 136 132* 135  K 4.1 4.0 4.6 4.3 4.9  CL 99 101 103 101 101  CO2 23 20* 22 19* 24  GLUCOSE 106* 105* 113* 91 99  BUN 8 7* 13 15 15   CREATININE 1.13* 1.21* 1.37* 1.20* 1.15*  CALCIUM 8.2* 8.2* 8.2* 7.6* 8.0*   Liver Function Tests: Recent Labs  Lab 07/14/18 1512 07/16/18 0922  AST 23 25  ALT 15 17  ALKPHOS 78 70  BILITOT 0.6 0.9  PROT 6.2* 5.8*  ALBUMIN 3.0* 2.5*   Recent Labs  Lab 07/14/18 1512  LIPASE 19   No results for input(s): AMMONIA in the last 168 hours. CBC: Recent Labs  Lab 07/14/18 1449 07/15/18 0125 07/17/18 0525 07/18/18 0443  WBC 9.6 8.5 9.5 10.0  NEUTROABS 6.6  --  6.8 7.9*  HGB 10.1* 9.4* 9.1* 8.6*  HCT 34.6* 30.8* 30.2* 29.4*  MCV 86.3 83.5 84.1 83.5  PLT 348 276 317 299   Cardiac Enzymes: Recent Labs  Lab 07/14/18 1449 07/14/18 2015  TROPONINI 0.04* 0.03*   BNP: BNP (last 3 results) Recent Labs    06/14/18 1328 07/14/18 1511  BNP 570.9* 434.0*    ProBNP (last 3 results) No results for input(s): PROBNP in the last 8760 hours.  CBG: No results for input(s): GLUCAP in the last 168 hours.  Active Problems:   Coronary artery disease   Prolonged QT interval   CKD (chronic kidney disease) stage 3, GFR 30-59 ml/min (HCC)   AF  (paroxysmal atrial fibrillation) (HCC)   Anxiety and depression   Essential hypertension   Chronic diastolic CHF (congestive heart failure) (HCC)   Cancer of right colon (HCC)   Acquired hypothyroidism   Major depressive disorder, single episode, unspecified   Pulmonary hypertension (HCC)   Anxiety state   Pleural effusion   HCAP (healthcare-associated pneumonia)   Time coordinating discharge: 38 minutes.  Signed:        Deneene Tarver, DO [facility]  07/18/2018, 4:00 PM

## 2018-07-19 LAB — CULTURE, BLOOD (ROUTINE X 2)
Culture: NO GROWTH
Culture: NO GROWTH
Special Requests: ADEQUATE
Special Requests: ADEQUATE

## 2018-07-20 LAB — PH, BODY FLUID: pH, Body Fluid: 7.6

## 2018-07-20 LAB — CULTURE, BODY FLUID W GRAM STAIN -BOTTLE: Culture: NO GROWTH

## 2018-07-20 LAB — CULTURE, BODY FLUID-BOTTLE

## 2018-07-22 ENCOUNTER — Telehealth: Payer: Self-pay | Admitting: *Deleted

## 2018-07-22 ENCOUNTER — Encounter (HOSPITAL_COMMUNITY): Payer: Self-pay

## 2018-07-22 ENCOUNTER — Other Ambulatory Visit: Payer: Self-pay

## 2018-07-22 ENCOUNTER — Inpatient Hospital Stay (HOSPITAL_COMMUNITY)
Admission: EM | Admit: 2018-07-22 | Discharge: 2018-07-31 | DRG: 291 | Disposition: E | Payer: Medicare Other | Attending: Internal Medicine | Admitting: Internal Medicine

## 2018-07-22 ENCOUNTER — Emergency Department (HOSPITAL_COMMUNITY): Payer: Medicare Other

## 2018-07-22 DIAGNOSIS — F329 Major depressive disorder, single episode, unspecified: Secondary | ICD-10-CM | POA: Diagnosis present

## 2018-07-22 DIAGNOSIS — Z8661 Personal history of infections of the central nervous system: Secondary | ICD-10-CM

## 2018-07-22 DIAGNOSIS — I4891 Unspecified atrial fibrillation: Secondary | ICD-10-CM

## 2018-07-22 DIAGNOSIS — I130 Hypertensive heart and chronic kidney disease with heart failure and stage 1 through stage 4 chronic kidney disease, or unspecified chronic kidney disease: Principal | ICD-10-CM | POA: Diagnosis present

## 2018-07-22 DIAGNOSIS — Z972 Presence of dental prosthetic device (complete) (partial): Secondary | ICD-10-CM

## 2018-07-22 DIAGNOSIS — R0600 Dyspnea, unspecified: Secondary | ICD-10-CM

## 2018-07-22 DIAGNOSIS — K219 Gastro-esophageal reflux disease without esophagitis: Secondary | ICD-10-CM | POA: Diagnosis present

## 2018-07-22 DIAGNOSIS — I509 Heart failure, unspecified: Secondary | ICD-10-CM | POA: Diagnosis not present

## 2018-07-22 DIAGNOSIS — N183 Chronic kidney disease, stage 3 unspecified: Secondary | ICD-10-CM

## 2018-07-22 DIAGNOSIS — Z87891 Personal history of nicotine dependence: Secondary | ICD-10-CM

## 2018-07-22 DIAGNOSIS — Z85820 Personal history of malignant melanoma of skin: Secondary | ICD-10-CM

## 2018-07-22 DIAGNOSIS — E785 Hyperlipidemia, unspecified: Secondary | ICD-10-CM | POA: Diagnosis present

## 2018-07-22 DIAGNOSIS — Z9049 Acquired absence of other specified parts of digestive tract: Secondary | ICD-10-CM

## 2018-07-22 DIAGNOSIS — Z933 Colostomy status: Secondary | ICD-10-CM

## 2018-07-22 DIAGNOSIS — K449 Diaphragmatic hernia without obstruction or gangrene: Secondary | ICD-10-CM | POA: Diagnosis present

## 2018-07-22 DIAGNOSIS — R7989 Other specified abnormal findings of blood chemistry: Secondary | ICD-10-CM

## 2018-07-22 DIAGNOSIS — Z955 Presence of coronary angioplasty implant and graft: Secondary | ICD-10-CM

## 2018-07-22 DIAGNOSIS — Z882 Allergy status to sulfonamides status: Secondary | ICD-10-CM

## 2018-07-22 DIAGNOSIS — K2270 Barrett's esophagus without dysplasia: Secondary | ICD-10-CM | POA: Diagnosis present

## 2018-07-22 DIAGNOSIS — D638 Anemia in other chronic diseases classified elsewhere: Secondary | ICD-10-CM | POA: Diagnosis present

## 2018-07-22 DIAGNOSIS — E039 Hypothyroidism, unspecified: Secondary | ICD-10-CM | POA: Diagnosis present

## 2018-07-22 DIAGNOSIS — R0603 Acute respiratory distress: Secondary | ICD-10-CM

## 2018-07-22 DIAGNOSIS — D72829 Elevated white blood cell count, unspecified: Secondary | ICD-10-CM

## 2018-07-22 DIAGNOSIS — D500 Iron deficiency anemia secondary to blood loss (chronic): Secondary | ICD-10-CM | POA: Diagnosis present

## 2018-07-22 DIAGNOSIS — R823 Hemoglobinuria: Secondary | ICD-10-CM | POA: Diagnosis present

## 2018-07-22 DIAGNOSIS — Z808 Family history of malignant neoplasm of other organs or systems: Secondary | ICD-10-CM

## 2018-07-22 DIAGNOSIS — C189 Malignant neoplasm of colon, unspecified: Secondary | ICD-10-CM | POA: Diagnosis present

## 2018-07-22 DIAGNOSIS — Z515 Encounter for palliative care: Secondary | ICD-10-CM | POA: Diagnosis present

## 2018-07-22 DIAGNOSIS — Z9981 Dependence on supplemental oxygen: Secondary | ICD-10-CM

## 2018-07-22 DIAGNOSIS — I5033 Acute on chronic diastolic (congestive) heart failure: Secondary | ICD-10-CM | POA: Diagnosis present

## 2018-07-22 DIAGNOSIS — I480 Paroxysmal atrial fibrillation: Secondary | ICD-10-CM | POA: Diagnosis present

## 2018-07-22 DIAGNOSIS — Z79899 Other long term (current) drug therapy: Secondary | ICD-10-CM

## 2018-07-22 DIAGNOSIS — E7800 Pure hypercholesterolemia, unspecified: Secondary | ICD-10-CM | POA: Diagnosis present

## 2018-07-22 DIAGNOSIS — Z7901 Long term (current) use of anticoagulants: Secondary | ICD-10-CM

## 2018-07-22 DIAGNOSIS — I252 Old myocardial infarction: Secondary | ICD-10-CM

## 2018-07-22 DIAGNOSIS — R9431 Abnormal electrocardiogram [ECG] [EKG]: Secondary | ICD-10-CM | POA: Diagnosis not present

## 2018-07-22 DIAGNOSIS — E8770 Fluid overload, unspecified: Secondary | ICD-10-CM | POA: Diagnosis present

## 2018-07-22 DIAGNOSIS — F419 Anxiety disorder, unspecified: Secondary | ICD-10-CM | POA: Diagnosis present

## 2018-07-22 DIAGNOSIS — Z8249 Family history of ischemic heart disease and other diseases of the circulatory system: Secondary | ICD-10-CM

## 2018-07-22 DIAGNOSIS — I4819 Other persistent atrial fibrillation: Secondary | ICD-10-CM | POA: Diagnosis present

## 2018-07-22 DIAGNOSIS — Z66 Do not resuscitate: Secondary | ICD-10-CM | POA: Diagnosis present

## 2018-07-22 DIAGNOSIS — I340 Nonrheumatic mitral (valve) insufficiency: Secondary | ICD-10-CM | POA: Diagnosis present

## 2018-07-22 DIAGNOSIS — N261 Atrophy of kidney (terminal): Secondary | ICD-10-CM | POA: Diagnosis present

## 2018-07-22 DIAGNOSIS — H3530 Unspecified macular degeneration: Secondary | ICD-10-CM | POA: Diagnosis present

## 2018-07-22 DIAGNOSIS — I272 Pulmonary hypertension, unspecified: Secondary | ICD-10-CM | POA: Diagnosis present

## 2018-07-22 DIAGNOSIS — Z8701 Personal history of pneumonia (recurrent): Secondary | ICD-10-CM

## 2018-07-22 DIAGNOSIS — Z7189 Other specified counseling: Secondary | ICD-10-CM

## 2018-07-22 DIAGNOSIS — I10 Essential (primary) hypertension: Secondary | ICD-10-CM | POA: Diagnosis present

## 2018-07-22 DIAGNOSIS — R778 Other specified abnormalities of plasma proteins: Secondary | ICD-10-CM | POA: Diagnosis present

## 2018-07-22 DIAGNOSIS — I5031 Acute diastolic (congestive) heart failure: Secondary | ICD-10-CM | POA: Diagnosis not present

## 2018-07-22 DIAGNOSIS — I2510 Atherosclerotic heart disease of native coronary artery without angina pectoris: Secondary | ICD-10-CM | POA: Diagnosis present

## 2018-07-22 DIAGNOSIS — Z79890 Hormone replacement therapy (postmenopausal): Secondary | ICD-10-CM

## 2018-07-22 DIAGNOSIS — K435 Parastomal hernia without obstruction or  gangrene: Secondary | ICD-10-CM | POA: Diagnosis present

## 2018-07-22 DIAGNOSIS — J9621 Acute and chronic respiratory failure with hypoxia: Secondary | ICD-10-CM

## 2018-07-22 DIAGNOSIS — I471 Supraventricular tachycardia: Secondary | ICD-10-CM | POA: Diagnosis present

## 2018-07-22 DIAGNOSIS — E871 Hypo-osmolality and hyponatremia: Secondary | ICD-10-CM | POA: Diagnosis present

## 2018-07-22 DIAGNOSIS — R627 Adult failure to thrive: Secondary | ICD-10-CM | POA: Diagnosis present

## 2018-07-22 DIAGNOSIS — R001 Bradycardia, unspecified: Secondary | ICD-10-CM | POA: Diagnosis present

## 2018-07-22 DIAGNOSIS — I482 Chronic atrial fibrillation, unspecified: Secondary | ICD-10-CM

## 2018-07-22 LAB — TROPONIN I
Troponin I: 0.06 ng/mL (ref <0.03)
Troponin I: 0.05 ng/mL (ref <0.03)

## 2018-07-22 LAB — URINALYSIS, ROUTINE W REFLEX MICROSCOPIC
Bilirubin Urine: NEGATIVE
Glucose, UA: NEGATIVE mg/dL
Ketones, ur: NEGATIVE mg/dL
Leukocytes,Ua: NEGATIVE
Nitrite: NEGATIVE
Protein, ur: NEGATIVE mg/dL
Specific Gravity, Urine: 1.012 (ref 1.005–1.030)
pH: 5 (ref 5.0–8.0)

## 2018-07-22 LAB — BLOOD GAS, VENOUS
Acid-base deficit: 0.1 mmol/L (ref 0.0–2.0)
Bicarbonate: 22.8 mmol/L (ref 20.0–28.0)
FIO2: 36
O2 Saturation: 51.3 %
Patient temperature: 36.5
pCO2, Ven: 44.9 mmHg (ref 44.0–60.0)
pH, Ven: 7.359 (ref 7.250–7.430)
pO2, Ven: 33.2 mmHg (ref 32.0–45.0)

## 2018-07-22 LAB — CBC WITH DIFFERENTIAL/PLATELET
Abs Immature Granulocytes: 0.17 10*3/uL — ABNORMAL HIGH (ref 0.00–0.07)
Basophils Absolute: 0 10*3/uL (ref 0.0–0.1)
Basophils Relative: 0 %
Eosinophils Absolute: 0 10*3/uL (ref 0.0–0.5)
Eosinophils Relative: 0 %
HCT: 33.2 % — ABNORMAL LOW (ref 36.0–46.0)
Hemoglobin: 10 g/dL — ABNORMAL LOW (ref 12.0–15.0)
Immature Granulocytes: 1 %
Lymphocytes Relative: 11 %
Lymphs Abs: 1.5 10*3/uL (ref 0.7–4.0)
MCH: 25.3 pg — ABNORMAL LOW (ref 26.0–34.0)
MCHC: 30.1 g/dL (ref 30.0–36.0)
MCV: 83.8 fL (ref 80.0–100.0)
Monocytes Absolute: 1 10*3/uL (ref 0.1–1.0)
Monocytes Relative: 7 %
Neutro Abs: 11.9 10*3/uL — ABNORMAL HIGH (ref 1.7–7.7)
Neutrophils Relative %: 81 %
Platelets: 404 10*3/uL — ABNORMAL HIGH (ref 150–400)
RBC: 3.96 MIL/uL (ref 3.87–5.11)
RDW: 17.8 % — ABNORMAL HIGH (ref 11.5–15.5)
WBC: 14.6 10*3/uL — ABNORMAL HIGH (ref 4.0–10.5)
nRBC: 0.2 % (ref 0.0–0.2)

## 2018-07-22 LAB — COMPREHENSIVE METABOLIC PANEL
ALT: 28 U/L (ref 0–44)
AST: 40 U/L (ref 15–41)
Albumin: 3 g/dL — ABNORMAL LOW (ref 3.5–5.0)
Alkaline Phosphatase: 138 U/L — ABNORMAL HIGH (ref 38–126)
Anion gap: 10 (ref 5–15)
BUN: 21 mg/dL (ref 8–23)
CO2: 23 mmol/L (ref 22–32)
Calcium: 8.4 mg/dL — ABNORMAL LOW (ref 8.9–10.3)
Chloride: 99 mmol/L (ref 98–111)
Creatinine, Ser: 1.31 mg/dL — ABNORMAL HIGH (ref 0.44–1.00)
GFR calc Af Amer: 44 mL/min — ABNORMAL LOW (ref >60)
GFR calc non Af Amer: 38 mL/min — ABNORMAL LOW (ref >60)
Glucose, Bld: 115 mg/dL — ABNORMAL HIGH (ref 70–99)
Potassium: 4.6 mmol/L (ref 3.5–5.1)
Sodium: 132 mmol/L — ABNORMAL LOW (ref 135–145)
Total Bilirubin: 0.8 mg/dL (ref 0.3–1.2)
Total Protein: 6.9 g/dL (ref 6.5–8.1)

## 2018-07-22 LAB — BRAIN NATRIURETIC PEPTIDE: B Natriuretic Peptide: 2781 pg/mL — ABNORMAL HIGH (ref 0.0–100.0)

## 2018-07-22 LAB — MAGNESIUM: Magnesium: 2.2 mg/dL (ref 1.7–2.4)

## 2018-07-22 LAB — LACTIC ACID, PLASMA
Lactic Acid, Venous: 1.9 mmol/L (ref 0.5–1.9)
Lactic Acid, Venous: 1.9 mmol/L (ref 0.5–1.9)

## 2018-07-22 MED ORDER — APIXABAN 5 MG PO TABS
5.0000 mg | ORAL_TABLET | Freq: Two times a day (BID) | ORAL | Status: DC
  Administered 2018-07-22 – 2018-07-27 (×11): 5 mg via ORAL
  Filled 2018-07-22 (×12): qty 1

## 2018-07-22 MED ORDER — ROSUVASTATIN CALCIUM 10 MG PO TABS
10.0000 mg | ORAL_TABLET | Freq: Every day | ORAL | Status: DC
  Administered 2018-07-22 – 2018-07-27 (×6): 10 mg via ORAL
  Filled 2018-07-22 (×6): qty 1

## 2018-07-22 MED ORDER — FUROSEMIDE 10 MG/ML IJ SOLN
20.0000 mg | Freq: Once | INTRAMUSCULAR | Status: AC
  Administered 2018-07-22: 20 mg via INTRAVENOUS
  Filled 2018-07-22: qty 2

## 2018-07-22 MED ORDER — ALPRAZOLAM 0.25 MG PO TABS
0.1250 mg | ORAL_TABLET | Freq: Three times a day (TID) | ORAL | Status: DC | PRN
  Administered 2018-07-22 – 2018-07-27 (×8): 0.25 mg via ORAL
  Filled 2018-07-22 (×8): qty 1

## 2018-07-22 MED ORDER — LORATADINE 10 MG PO TABS
10.0000 mg | ORAL_TABLET | Freq: Every day | ORAL | Status: DC
  Administered 2018-07-22 – 2018-07-27 (×6): 10 mg via ORAL
  Filled 2018-07-22 (×6): qty 1

## 2018-07-22 MED ORDER — ALBUTEROL SULFATE (2.5 MG/3ML) 0.083% IN NEBU
3.0000 mL | INHALATION_SOLUTION | Freq: Four times a day (QID) | RESPIRATORY_TRACT | Status: DC | PRN
  Filled 2018-07-22: qty 3

## 2018-07-22 MED ORDER — FUROSEMIDE 10 MG/ML IJ SOLN
20.0000 mg | Freq: Two times a day (BID) | INTRAMUSCULAR | Status: DC
  Administered 2018-07-23 – 2018-07-26 (×7): 20 mg via INTRAVENOUS
  Filled 2018-07-22 (×7): qty 2

## 2018-07-22 MED ORDER — MAGNESIUM SULFATE 2 GM/50ML IV SOLN
2.0000 g | Freq: Once | INTRAVENOUS | Status: AC
  Administered 2018-07-22: 2 g via INTRAVENOUS
  Filled 2018-07-22: qty 50

## 2018-07-22 MED ORDER — CITALOPRAM HYDROBROMIDE 20 MG PO TABS: 40.0000 mg | ORAL_TABLET | Freq: Every day | ORAL | Status: DC

## 2018-07-22 MED ORDER — PANTOPRAZOLE SODIUM 40 MG PO TBEC: 40.0000 mg | DELAYED_RELEASE_TABLET | Freq: Every day | ORAL | Status: DC

## 2018-07-22 MED ORDER — ISOSORBIDE MONONITRATE ER 60 MG PO TB24
30.0000 mg | ORAL_TABLET | Freq: Every morning | ORAL | Status: DC
  Administered 2018-07-23: 09:00:00 30 mg via ORAL
  Filled 2018-07-22: qty 1

## 2018-07-22 MED ORDER — ZOLPIDEM TARTRATE 5 MG PO TABS
5.0000 mg | ORAL_TABLET | Freq: Every day | ORAL | Status: DC
  Administered 2018-07-22 – 2018-07-27 (×6): 5 mg via ORAL
  Filled 2018-07-22 (×6): qty 1

## 2018-07-22 MED ORDER — MONTELUKAST SODIUM 10 MG PO TABS
10.0000 mg | ORAL_TABLET | Freq: Every day | ORAL | Status: DC
  Administered 2018-07-22 – 2018-07-27 (×6): 10 mg via ORAL
  Filled 2018-07-22 (×6): qty 1

## 2018-07-22 MED ORDER — HYDRALAZINE HCL 25 MG PO TABS
50.0000 mg | ORAL_TABLET | Freq: Three times a day (TID) | ORAL | Status: DC
  Administered 2018-07-22 – 2018-07-23 (×2): 50 mg via ORAL
  Filled 2018-07-22 (×3): qty 2

## 2018-07-22 MED ORDER — ACETAMINOPHEN 325 MG PO TABS
650.0000 mg | ORAL_TABLET | Freq: Four times a day (QID) | ORAL | Status: DC | PRN
  Administered 2018-07-25 – 2018-07-26 (×2): 650 mg via ORAL
  Filled 2018-07-22 (×2): qty 2

## 2018-07-22 MED ORDER — SODIUM CHLORIDE 0.9 % IV SOLN
INTRAVENOUS | Status: DC | PRN
  Administered 2018-07-22: 20:00:00 via INTRAVENOUS

## 2018-07-22 MED ORDER — FERROUS SULFATE 325 (65 FE) MG PO TABS
325.0000 mg | ORAL_TABLET | Freq: Every day | ORAL | Status: DC
  Administered 2018-07-23 – 2018-07-27 (×5): 325 mg via ORAL
  Filled 2018-07-22 (×6): qty 1

## 2018-07-22 MED ORDER — METOPROLOL SUCCINATE ER 25 MG PO TB24
12.5000 mg | ORAL_TABLET | Freq: Every day | ORAL | Status: DC
  Administered 2018-07-23 – 2018-07-24 (×2): 12.5 mg via ORAL
  Filled 2018-07-22 (×2): qty 1

## 2018-07-22 MED ORDER — LEVOTHYROXINE SODIUM 25 MCG PO TABS
50.0000 ug | ORAL_TABLET | Freq: Every day | ORAL | Status: DC
  Administered 2018-07-23 – 2018-07-27 (×5): 50 ug via ORAL
  Filled 2018-07-22 (×4): qty 2
  Filled 2018-07-22: qty 1
  Filled 2018-07-22: qty 2
  Filled 2018-07-22: qty 1

## 2018-07-22 MED ORDER — MECLIZINE HCL 12.5 MG PO TABS
25.0000 mg | ORAL_TABLET | Freq: Three times a day (TID) | ORAL | Status: DC | PRN
  Filled 2018-07-22: qty 2

## 2018-07-22 MED ORDER — AMIODARONE HCL 200 MG PO TABS
200.0000 mg | ORAL_TABLET | Freq: Every day | ORAL | Status: DC
  Administered 2018-07-23 – 2018-07-27 (×5): 200 mg via ORAL
  Filled 2018-07-22 (×5): qty 1

## 2018-07-22 MED ORDER — ACETAMINOPHEN 650 MG RE SUPP: 650.0000 mg | Freq: Four times a day (QID) | RECTAL | Status: DC | PRN

## 2018-07-22 MED ORDER — NITROGLYCERIN 0.4 MG SL SUBL: 0.4000 mg | SUBLINGUAL_TABLET | SUBLINGUAL | Status: DC | PRN

## 2018-07-22 MED ORDER — LOSARTAN POTASSIUM 50 MG PO TABS
100.0000 mg | ORAL_TABLET | Freq: Every day | ORAL | Status: DC
  Administered 2018-07-23: 09:00:00 100 mg via ORAL
  Filled 2018-07-22: qty 2

## 2018-07-22 MED ORDER — TRAMADOL HCL 50 MG PO TABS: 50.0000 mg | ORAL_TABLET | Freq: Every day | ORAL | Status: DC | PRN

## 2018-07-22 MED ORDER — FLUTICASONE PROPIONATE 50 MCG/ACT NA SUSP
2.0000 | Freq: Every day | NASAL | Status: DC
  Administered 2018-07-22 – 2018-07-27 (×5): 2 via NASAL
  Filled 2018-07-22 (×2): qty 16

## 2018-07-22 NOTE — ED Provider Notes (Signed)
[facility] EMERGENCY DEPARTMENT Provider Note   CSN: [id number] Arrival date & time: 07/09/2018  1417    History   Chief Complaint Chief Complaint  Patient presents with   Shortness of Breath    HPI [name] is a 84 y.o. female.     HPI   She presents by EMS for evaluation of shortness of breath and hypoxia.  Patient is on home oxygen.  Recently admitted and evaluated for shortness of breath, found to have H CAP and pleural effusion which was drained, by thoracentesis.  Sent home on oxygen, at that time.  Last month hospitalized after inguinal hernia repair with complication atrial fibrillation, and subsequent troponin elevation.  Cardiac catheterization deferred.  Cardiac echo done at that time indicates normal left ventricular function, without other significant cardiac abnormality.  Today, she states she called EMS because she was having trouble breathing.  Her oxygen was increased to 4 L because of a saturation of 86%.  She denies fever, cough, vomiting, dizziness, focal weakness or paresthesia.  She states that she has been heaving today, but nothing comes up.  There are no other known modifying factors.    Past Medical History:  Diagnosis Date   Anemia due to blood loss, chronic    Anxiety state, unspecified    Atrophy of left kidney    Cancer ([place])    melanoma on back   Cancer of right colon ([place]) 05/20/2017   CKD (chronic kidney disease), stage III [facility])      sees Dr. Lowanda Foster    Colon cancer [facility]) 2019   stage 3   Colostomy in place [facility])    in 2016   Coronary artery disease cardiologist-  dr Bronson Ing   a. Moderate to severe coronary disease involving the left anterior descending artery and right coronary artery.  Sequential stenosis in the LAD is significant.  Right coronary artery is moderate to severely likely nonischemic. Questionable small LVOT obstruction.  No Brockenbrough maneuver was performed.  Catheterization January 2013 w/ PTCA and DES x1  to pLAD b. 05/2014 MV no isch/infarct, EF 60%.   Depressive disorder, not elsewhere classified    Diastolic CHF, chronic [facility])    cardiologist-  dr Bronson Ing   pt. denies at preop. 06/07/18 - EF 60-65% on echo, no obvious dysfunction appreciated   Diversion colitis 2016   Epidural abscess L5- S1 03/01/2016   Gastro-esophageal reflux disease without esophagitis 07/16/2007   Overview:  Overview:  Qualifier: Diagnosis of  By: Nelson-Smith CMA (AAMA), Dottie  Overview:  Overview:  Overview:  Qualifier: Diagnosis of  By: Nelson-Smith CMA (AAMA), Dottie    GERD (gastroesophageal reflux disease)    H/O hiatal hernia    History of [name]'s esophagus    History of melanoma excision    back   Hypertension    Hypothyroidism    Infection of spine ([place])    in hospial   Macular degeneration    both eyes   PAF (paroxysmal atrial fibrillation) ([place])    Parastomal hernia    Pneumonia    PONV (postoperative nausea and vomiting)    Pulmonary hypertension (HCC)    PA systolic [id number] mmHg by echocardiogram 06-17-2015  PA pressure 05-08-2011 by cardiac catheterization -- no sig. pulm. htn)PA saturation 62% thermodilution cardiac index 2.0 thick cardiac index 2.4   Pure hypercholesterolemia    S/P drug eluting coronary stent placement 05/08/2011   x1 DES to proximal LAD   Symptomatic cholelithiasis 07/18/2016   Tubulovillous adenoma  of colon    Wears dentures    lower   Wears glasses     Patient Active Problem List   Diagnosis Date Noted   Volume overload 07/12/2018   HCAP (healthcare-associated pneumonia) 07/16/2018   Pleural effusion 07/14/2018   Weakness generalized 06/14/2018   Renal failure (ARF), acute on chronic ([place]) 06/14/2018   Non-STEMI (non-ST elevated myocardial infarction) ([place]) 06/11/2018   Current use of long term anticoagulation 06/11/2018   Incarcerated recurrent hiatal hernia s/p robotic repair 06/05/2018 06/05/2018   GERD (gastroesophageal  reflux disease) s/p [place] fundoplication (partial posterior wrap) 06/05/2018   S/P robotic partial posterior ([place]) fundoplication 04/07/5100 58/52/7782   Pulmonary hypertension ([place])    Hypothyroidism    History of [name]'s esophagus    Anxiety state    Acquired hypothyroidism 01/30/2018   Cancer of right colon ([place]) 05/20/2017   Irritable bowel syndrome with constipation 10/15/2016   Iron deficiency 08/13/2016   Discitis of lumbar region L5- S1 03/01/2016   Chronic diastolic CHF (congestive heart failure) ([place]) 03/01/2016   DDD (degenerative disc disease), lumbar 02/12/2016   History of GI bleed 12/18/2015   Dehydration 08/29/2015   Gastric volvulus 08/29/2015   Erosive esophagitis 08/29/2015   Atrial fibrillation with RVR ([place]) 08/09/2015   TIA (transient ischemic attack)    Pressure ulcer 06/17/2015   Essential hypertension    Colonic mass 05/02/2015   Anemia due to chronic blood loss 05/02/2015   AF (paroxysmal atrial fibrillation) ([place]) 05/02/2015   Anxiety and depression 05/02/2015   Prolonged QT interval 03/03/2015   CKD (chronic kidney disease) stage 3, GFR 30-59 ml/min ([place]) 03/03/2015   History of percutaneous coronary intervention    Coronary artery disease    HYPERCHOLESTEROLEMIA 07/16/2007   Major depressive disorder, single episode, unspecified 07/16/2007    Past Surgical History:  Procedure Laterality Date   CARDIAC CATHETERIZATION Left 03/05/2015   Procedure: CENTRAL LINE INSERTION;  Surgeon: Aviva Signs, MD;  Location: [facility];  Service: General;  Laterality: Left;   CATARACT EXTRACTION W/ INTRAOCULAR LENS  IMPLANT, BILATERAL  ~ 2002   CHOLECYSTECTOMY N/A 07/18/2016   Procedure: LAPAROSCOPIC CHOLECYSTECTOMY WITH INTRAOPERATIVE CHOLANGIOGRAM;  Surgeon: [name], MD;  Location: [facility];  Service: General;  Laterality: N/A;   COLECTOMY WITH COLOSTOMY CREATION/HARTMANN PROCEDURE N/A 03/05/2015   Procedure: PARTIAL COLECTOMY  WITH COLOSTOMY CREATION/HARTMANN PROCEDURE;  Surgeon: Aviva Signs, MD;  Location: [facility];  Service: General;  Laterality: N/A;   COLON SURGERY     colostomy bag  LLQ   ESOPHAGEAL MANOMETRY N/A 06/06/2014   Procedure: ESOPHAGEAL MANOMETRY (EM);  Surgeon: Jerene Bears, MD;  Location: [facility] END[facility];  Service: Gastroenterology;  Laterality: N/A;   ESOPHAGOGASTRODUODENOSCOPY (EGD) WITH PROPOFOL N/A 08/04/2015   Procedure: ESOPHAGOGASTRODUODENOSCOPY (EGD) WITH PROPOFOL;  Surgeon: Mauri Pole, MD;  Location: [facility];  Service: Endoscopy;  Laterality: N/A;   HERNIA REPAIR  2019   left side/ removed mass   IR GENERIC HISTORICAL  03/04/2016   IR LUMBAR DISC ASPIRATION W/IMG GUIDE 03/04/2016 Luanne Bras, MD [facility]-INTERV RAD   IR THORACENTESIS ASP PLEURAL SPACE W/IMG GUIDE  07/15/2018   LAPAROSCOPIC NISSEN FUNDOPLICATION N/A 07/23/5359   Procedure: LAPAROSCOPIC  LYSIS OF ADHESIONS, SEGMENTAL GASTRECTOMY, PARTIAL REDUCTION OF HERNIA;  Surgeon: [name], MD;  Location: [facility];  Service: General;  Laterality: N/A;   LAPAROSCOPIC PARTIAL COLECTOMY N/A 05/07/2017   Procedure: LAPAROSCOPIC  CONVERTED TO OPEN RIGHT COLECTOMY, PARASTOMAL HERNIA  REPAIR;  Surgeon: Leighton Ruff, MD;  Location: [facility];  Service: General;  Laterality: N/A;   NISSEN FUNDOPLICATION  3762   PERCUTANEOUS CORONARY STENT INTERVENTION (PCI-S) N/A 05/08/2011   Procedure: PERCUTANEOUS CORONARY STENT INTERVENTION (PCI-S);  Surgeon: Wellington Hampshire, MD;  Location: [facility] CATH LAB;  Service: Cardiovascular;  Laterality: N/A;   ROTATOR CUFF REPAIR  ~ 2001   right   SHOULDER ARTHROSCOPY  ~ 2004; 2005   left; "joint's wore out"   TONSILLECTOMY  ~ [place]     OB History   No obstetric history on file.      Home Medications    Prior to Admission medications   Medication Sig Start Date End Date Taking? Authorizing Provider  acetaminophen (TYLENOL) 325 MG tablet Take 2 tablets (650 mg total) by  mouth every 6 (six) hours as needed for mild pain (or Fever >/= 101). 07/18/18  Yes Swayze, Ava, DO  albuterol (PROVENTIL HFA;VENTOLIN HFA) 108 (90 Base) MCG/ACT inhaler Inhale 2 puffs into the lungs every 6 (six) hours as needed for wheezing or shortness of breath. 04/15/18  Yes Terald Sleeper, PA-C  ALPRAZolam (XANAX) 0.25 MG tablet TAKEK 1/2 TO 1 TABLET 3 TIMES DAILY AS NEEDED Patient taking differently: Take 0.125-0.25 mg by mouth 3 (three) times daily as needed for anxiety.  01/30/18  Yes Terald Sleeper, PA-C  amiodarone (PACERONE) 200 MG tablet Take 1 tablet (200 mg total) by mouth daily. 07/19/18  Yes Swayze, Ava, DO  apixaban (ELIQUIS) 5 MG TABS tablet Take 1 tablet (5 mg total) by mouth 2 (two) times daily. 06/11/18  Yes Michael Boston, MD  citalopram (CELEXA) 40 MG tablet TAKE ONE (1) TABLET EACH DAY Patient taking differently: Take 40 mg by mouth daily.  01/30/18  Yes Terald Sleeper, PA-C  denosumab (PROLIA) 60 MG/ML SOSY injection Inject 60 mg into the skin every 6 (six) months. 02/18/18  Yes Terald Sleeper, PA-C  esomeprazole (NEXIUM) 40 MG capsule TAKE ONE (1) [place] Patient taking differently: Take 40 mg by mouth daily.  01/30/18  Yes Terald Sleeper, PA-C  ferrous sulfate 325 (65 FE) MG EC tablet Take 325 mg by mouth daily with breakfast.    Yes [provider]  fluticasone (FLONASE) 50 MCG/ACT nasal spray USE 2 SPRAYS IN EACH NOSTRIL DAILY Patient taking differently: Place 2 sprays into both nostrils daily.  10/15/17  Yes Terald Sleeper, PA-C  hydrALAZINE (APRESOLINE) 50 MG tablet TAKE ONE (1) TABLET THREE (3) [place] Patient taking differently: Take 50 mg by mouth 3 (three) times daily.  01/30/18  Yes Terald Sleeper, PA-C  isosorbide mononitrate (IMDUR) 30 MG 24 hr tablet Take 1 tablet (30 mg total) by mouth every morning. 01/30/18  Yes Terald Sleeper, PA-C  levothyroxine (SYNTHROID, LEVOTHROID) 50 MCG tablet TAKE ONE TABLET EACH MORNING BEFORE BREAKFAST Patient  taking differently: Take 50 mcg by mouth daily before breakfast.  01/30/18  Yes Terald Sleeper, PA-C  loratadine (CLARITIN) 10 MG tablet Take 1 tablet (10 mg total) by mouth at bedtime. 01/30/18  Yes Terald Sleeper, PA-C  losartan (COZAAR) 100 MG tablet TAKE ONE (1) TABLET EACH DAY Patient taking differently: Take 100 mg by mouth daily.  01/30/18  Yes Terald Sleeper, PA-C  Meclizine HCl 25 MG CHEW TAKE 1 TABLET 3 TIMES DAILY AS NEEDED FOR DIZZINESS Patient taking differently: Chew 25 mg by mouth 3 (three) times daily as needed (dizziness).  10/15/17  Yes Terald Sleeper, PA-C  metoprolol succinate (TOPROL-XL) 25 MG 24 hr tablet Take 0.5 tablets (12.5 mg total) by mouth daily. 07/19/18  Yes Swayze, Ava, DO  montelukast (SINGULAIR) 10 MG tablet Take 1 tablet (10 mg total) by mouth at bedtime. 01/30/18  Yes Terald Sleeper, PA-C  Multiple Vitamins-Minerals (ICAPS AREDS 2 PO) Take 1 capsule by mouth 2 (two) times a day.   Yes [provider]  nitroGLYCERIN (NITROSTAT) 0.4 MG SL tablet Place 1 tablet (0.4 mg total) under the tongue every 5 (five) minutes as needed for chest pain. Reported on 04/26/2015 01/13/17  Yes Herminio Commons, MD  nystatin (MYCOSTATIN/NYSTOP) powder Apply topically 4 (four) times daily. Patient taking differently: Apply 1 Bottle topically as needed (yeast).  05/05/18  Yes Terald Sleeper, PA-C  ondansetron (ZOFRAN-ODT) 4 MG disintegrating tablet Take 1 tablet (4 mg total) by mouth every 6 (six) hours as needed for nausea or vomiting. 06/11/18  Yes Gross, Remo Lipps, MD  polyethylene glycol powder (GAVILAX) powder MIX 17 GRAMS IN 80Z OF WATER/JUICE AND DRINK TWICE DAILY Patient taking differently: Take 17 g by mouth daily.  10/15/17  Yes Terald Sleeper, PA-C  Propylene Glycol (SYSTANE BALANCE OP) Apply 1 drop to eye daily as needed (dry eyes).   Yes [provider]  rosuvastatin (CRESTOR) 10 MG tablet TAKE ONE (1) TABLET EACH DAY Patient taking differently: Take 10 mg by mouth  daily.  01/30/18  Yes Terald Sleeper, PA-C  traMADol (ULTRAM) 50 MG tablet Take 1 tablet (50 mg total) by mouth every 12 (twelve) hours as needed. Patient taking differently: Take 50 mg by mouth daily as needed for moderate pain.  06/12/17  Yes Alla Feeling, NP  Vitamin D, Ergocalciferol, (DRISDOL) 50000 units CAPS capsule TAKE 1 CAPSULE ON TUESDAYS Patient taking differently: Take 50,000 Units by mouth every Tuesday.  09/11/17  Yes Terald Sleeper, PA-C  zolpidem (AMBIEN) 10 MG tablet Take 10 mg by mouth at bedtime.   Yes [provider]    Family History Family History  Problem Relation Age of Onset   Heart attack Father    Hypertension Father    Hypertension Mother    Skin cancer Brother        melanoma   Atrial fibrillation Sister    Skin cancer Sister    Colon cancer Neg Hx    Esophageal cancer Neg Hx    Rectal cancer Neg Hx    Stomach cancer Neg Hx     Social History Social History   Tobacco Use   Smoking status: Former Smoker    Packs/day: 1.00    Years: 20.00    Pack years: 20.00    Types: Cigarettes    Start date: 10/12/1958    Last attempt to quit: 04/26/1988    Years since quitting: 30.2   Smokeless tobacco: Never Used  Substance Use Topics   Alcohol use: No    Alcohol/week: 0.0 standard drinks   Drug use: No     Allergies   Sulfamethoxazole   Review of Systems Review of Systems  All other systems reviewed and are negative.    Physical Exam Updated Vital Signs BP (!) 144/87    Pulse 86    Temp 97.8 F (36.6 C) (Oral)    Resp (!) 27    Ht 5\' 2"  (1.575 m)    Wt 67 kg    SpO2 95%    BMI 27.02 kg/m   Physical Exam Vitals signs and nursing note reviewed.  Constitutional:      General: She is not in acute distress.    Appearance: She is well-developed. She is ill-appearing. She is not toxic-appearing or diaphoretic.  HENT:     Head: Normocephalic and atraumatic.     Right Ear: External ear normal.     Left Ear: External ear  normal.  Eyes:     Conjunctiva/sclera: Conjunctivae normal.     Pupils: Pupils are equal, round, and reactive to light.  Neck:     Musculoskeletal: Normal range of motion and neck supple.     Trachea: Phonation normal.  Cardiovascular:     Rate and Rhythm: Normal rate and regular rhythm.     Heart sounds: Normal heart sounds.     Comments: No JVD. Pulmonary:     Effort: Pulmonary effort is normal. No respiratory distress.     Breath sounds: No stridor.     Comments: Tachypneic.  Decreased breath sounds left mid to lower lung.  Few scattered rhonchi. Abdominal:     Palpations: Abdomen is soft.     Tenderness: There is no abdominal tenderness.  Musculoskeletal: Normal range of motion.        General: No swelling or tenderness.  Skin:    General: Skin is warm and dry.  Neurological:     Mental Status: She is alert and oriented to person, place, and time.     Cranial Nerves: No cranial nerve deficit.     Sensory: No sensory deficit.     Motor: No abnormal muscle tone.     Coordination: Coordination normal.  Psychiatric:        Mood and Affect: Mood normal.        Behavior: Behavior normal.      ED Treatments / Results  Labs (all labs ordered are listed, but only abnormal results are displayed) Labs Reviewed  COMPREHENSIVE METABOLIC PANEL - Abnormal; Notable for the following components:      Result Value   Sodium 132 (*)    Glucose, Bld 115 (*)    Creatinine, Ser 1.31 (*)    Calcium 8.4 (*)    Albumin 3.0 (*)    Alkaline Phosphatase 138 (*)    GFR calc non Af Amer 38 (*)    GFR calc Af Amer 44 (*)    All other components within normal limits  CBC WITH DIFFERENTIAL/PLATELET - Abnormal; Notable for the following components:   WBC 14.6 (*)    Hemoglobin 10.0 (*)    HCT 33.2 (*)    MCH 25.3 (*)    RDW 17.8 (*)    Platelets 404 (*)    Neutro Abs 11.9 (*)    Abs Immature Granulocytes 0.17 (*)    All other components within normal limits  TROPONIN I - Abnormal;  Notable for the following components:   Troponin I 0.06 (*)    All other components within normal limits  TROPONIN I - Abnormal; Notable for the following components:   Troponin I 0.05 (*)    All other components within normal limits  CULTURE, BLOOD (ROUTINE X 2)  CULTURE, BLOOD (ROUTINE X 2)  BLOOD GAS, VENOUS  LACTIC ACID, PLASMA  LACTIC ACID, PLASMA  URINALYSIS, ROUTINE W REFLEX MICROSCOPIC  MAGNESIUM  BRAIN NATRIURETIC PEPTIDE    EKG EKG Interpretation  Date/Time:  Wednesday July 22 2018 14:28:11 EDT Ventricular Rate:  86 PR Interval:    QRS Duration: 104 QT Interval:  421 QTC Calculation: 504 R Axis:   90 Text  Interpretation:  Sinus rhythm Probable anterior infarct, age indeterminate Prolonged QT interval Since last tracing QT has lengthened Confirmed by Daleen Bo [id number]) on 07/06/2018 3:09:03 PM   Radiology Dg Chest Port 1 View  Result Date: 07/07/2018 CLINICAL DATA:  Shortness of breath EXAM: PORTABLE CHEST 1 VIEW COMPARISON:  07/15/2018 FINDINGS: Cardiac shadow is enlarged but stable. Bilateral pleural effusions are noted right greater than left with associated underlying atelectatic changes. Increased vascular congestion is noted as well. IMPRESSION: Increasing changes of CHF with bilateral effusions. Bibasilar atelectatic changes are as well. Electronically Signed   By: Inez Catalina M.D.   On: 07/23/2018 15:11    Procedures .Critical Care Performed by: Daleen Bo, MD Authorized by: Daleen Bo, MD   Critical care provider statement:    Critical care time (minutes):  35   Critical care start time:  07/08/2018 2:25 PM   Critical care end time:  07/03/2018 7:43 PM   Critical care time was exclusive of:  Separately billable procedures and treating other patients   Critical care was necessary to treat or prevent imminent or life-threatening deterioration of the following conditions:  Cardiac failure   Critical care was time spent personally by me on the  following activities:  Blood draw for specimens, development of treatment plan with patient or surrogate, discussions with consultants, evaluation of patient's response to treatment, examination of patient, obtaining history from patient or surrogate, ordering and performing treatments and interventions, ordering and review of laboratory studies, pulse oximetry, re-evaluation of patient's condition, review of old charts and ordering and review of radiographic studies   (including critical care time)  Medications Ordered in ED Medications  furosemide (LASIX) injection 20 mg (20 mg Intravenous Given 07/29/2018 1804)     Initial Impression / Assessment and Plan / ED Course  I have reviewed the triage vital signs and the nursing notes.  Pertinent labs & imaging results that were available during my care of the patient were reviewed by me and considered in my medical decision making (see chart for details).  Clinical Course as of Jul 21 1941  Wed Jul 22, 2018  1542 Normal  Blood gas, venous [EW]  1542 Normal except sodium low, glucose high, creatinine high, calcium low, albumin low, alkaline phosphatase high, GFR low  Comprehensive metabolic panel(!) [EW]  9924 High  Troponin I - Once(!!) [EW]  1542 Normal except white count high, hemoglobin low  CBC with Differential(!) [EW]  2683 CHF with bilateral effusions, and basilar atelectasis.  Progression since 1 week ago.  Image reviewed by me.  DG Chest Port 1 View [EW]  1722 Normal  Lactic acid, plasma [EW]    Clinical Course User Index [EW] Daleen Bo, MD        Patient Vitals for the past 24 hrs:  BP Temp Temp src Pulse Resp SpO2 Height Weight  07/05/2018 1900 (!) 144/87 -- -- 86 (!) 27 95 % -- --  07/29/2018 1830 138/77 -- -- 84 (!) 26 96 % -- --  07/08/2018 1800 136/85 -- -- 83 (!) 28 95 % -- --  07/14/2018 1730 129/75 -- -- 83 (!) 25 98 % -- --  07/11/2018 1700 135/73 -- -- 84 (!) 27 98 % -- --  07/21/2018 1630 (!) 148/82 -- -- 89 (!) 34  94 % -- --  07/27/2018 1600 (!) 144/79 -- -- 83 (!) 26 98 % -- --  07/16/2018 1536 140/85 -- -- 85 (!) 29 97 % -- --  07/09/2018 1500  139/87 -- -- 84 (!) 30 97 % -- --  07/02/2018 1429 (!) 143/79 97.8 F (36.6 C) Oral 85 (!) 31 96 % -- --  07/06/2018 1428 -- -- -- -- -- -- 5\' 2"  (1.575 m) 67 kg    7:43 PM Reevaluation with update and discussion. After initial assessment and treatment, an updated evaluation reveals change in clinical status, she has not yet urinated.  Findings discussed with the patient and questions were answered.  She agrees to admission. Daleen Bo   Medical Decision Making: Patient with shortness of breath, and evaluation consistent with CHF.  She previously had a right-sided effusion which was drained, she is not currently on diuretic medications.  Initial mild elevation of troponin, repeated.  Doubt ACS, PE or pneumonia.  Recent cardiac echo showed normal left ventricular function.  The cause of the CHF is not clear.  Laboratory testing of thoracocentesis done, 7 days ago did not indicate infection.  Culture still pending.  Patient's hypoxia will require admission for monitoring stabilization.  ACS, PE or pneumonia.  CRITICAL CARE-yes Performed by: Daleen Bo   Nursing Notes Reviewed/ Care Coordinated Applicable Imaging Reviewed Interpretation of Laboratory Data incorporated into ED treatment  7:02 P.M.-Consult complete with Hospitalist. Patient [place] explained and discussed. He agrees to admit patient for further evaluation and treatment. Call ended at 7:40 PM  Plan: Admit   Final Clinical Impressions(s) / ED Diagnoses   Final diagnoses:  Acute congestive heart failure, unspecified heart failure type [facility])    ED Discharge Orders    None       Daleen Bo, MD 07/06/2018 1944

## 2018-07-22 NOTE — H&P (Addendum)
History and Physical    [name] ZCH:[id number] DOB: 01-29-1935 DOA: 07/08/2018  PCP: Terald Sleeper, PA-C   Patient coming from: Home.  I have personally briefly reviewed patient's old medical records in [place]  Chief Complaint: Shortness of breath.  HPI: [name] is a 84 y.o. female with medical history significant of chronic blood loss anemia, anxiety, depression, atrophy of the left kidney, melanoma, right colon cancer, stage III chronic kidney disease, GERD, hiatal hernia, [name]'s esophagus, hypertension, hypothyroidism, macular degeneration, pneumonia, pulmonary hypertension, paroxysmal atrial fibrillation, CAD, history of NSTEMI, chronic diastolic CHF who has been admitted 3 times in the past 6 weeks, initially for robotic repair of incarcerated recurrent hiatal hernia that was complicated by postop A. fib with marked "asymptomatic" elevation of troponin to 13.58 and started on amiodarone and Eliquis.  She was readmitted a few days later after discharge due to paroxysmal A. fib with RVR and acute kidney injury.  She was supposed to have TEE cardioversion, but subsequently converted to sinus rhythm.  She was readmitted on 07/14/2018 for a total of 4 days after presenting there with dyspnea and clear or whitish sputum producing cough.  COVID-19 test was negative.  Today she presents again to the emergency department with complaints of still feeling weak and dyspneic.  Her O2 sat was 86% when EMS arrived.  This improved with nasal cannula oxygen.  She denies fever, sore throat, wheezing or hemoptysis.  She denies chest pain, palpitations, dizziness, diaphoresis, but complains of lower extremity edema, PND and orthopnea.  She denies abdominal pain, nausea, emesis, diarrhea, constipation, melena or hematochezia.  No dysuria, frequency or hematuria.  ED Course: Initial vital signs temperature 97.8 F, pulse 85, respirations 31, blood pressure 143/79 mmHg and O2 sat 96% on room air.  The  patient received supplemental oxygen and furosemide 20 mg IVP in the emergency department.  Blood cultures x2 were drawn.  Urinalysis shows small hemoglobinuria and rare bacteria, but was otherwise unremarkable.  A venous blood gas was normal.  Lactic acid was 1.9 mmol/L x2.  CBC shows a white count of 14.6, with 81% neutrophils, 11% lymphocytes and 7% monocytes.  Hemoglobin is 10.0 g/dL and platelets 404.  Troponin was 0.06 and 0.05 ng/mL.  BNP was 2781 pg/mL.  Chemistry shows that her sodium was 132 mmol/L, all other CMP electrolytes are within normal limits when calcium is corrected to an albumin of 3.0 g/dL.  Glucose 115, BUN 21, creatinine 1.31 and calcium 8.4 mg/dL.  Alkaline phosphatase is 138 units/L.  Total protein, total bilirubin and transaminases are normal.  Her chest radiograph shows increased CHF with bilateral effusions and atelectatic changes.  Please see images and full radiology report for further detail.  Review of Systems: As per HPI otherwise 10 point review of systems negative.   Past Medical History:  Diagnosis Date  . Anemia due to blood loss, chronic   . Anxiety state, unspecified   . Atrophy of left kidney   . Cancer ([place])    melanoma on back  . Cancer of right colon ([place]) 05/20/2017  . CKD (chronic kidney disease), stage III (HCC)      sees Dr. Lowanda Foster   . Colon cancer ([place]) 2019   stage 3  . Colostomy in place [facility])    in 2016  . Coronary artery disease cardiologist-  dr Bronson Ing   a. Moderate to severe coronary disease involving the left anterior descending artery and right coronary artery.  Sequential  stenosis in the LAD is significant.  Right coronary artery is moderate to severely likely nonischemic. Questionable small LVOT obstruction.  No Brockenbrough maneuver was performed.  Catheterization January 2013 w/ PTCA and DES x1 to pLAD b. 05/2014 MV no isch/infarct, EF 60%.  . Depressive disorder, not elsewhere classified   . Diastolic CHF, chronic [facility])     cardiologist-  dr Bronson Ing   pt. denies at preop. 06/07/18 - EF 60-65% on echo, no obvious dysfunction appreciated  . Diversion colitis 2016  . Epidural abscess L5- S1 03/01/2016  . Gastro-esophageal reflux disease without esophagitis 07/16/2007   Overview:  Overview:  Qualifier: Diagnosis of  By: Nelson-Smith CMA (AAMA), Dottie  Overview:  Overview:  Overview:  Qualifier: Diagnosis of  By: Nelson-Smith CMA (AAMA), Dottie   . GERD (gastroesophageal reflux disease)   . H/O hiatal hernia   . History of [name]'s esophagus   . History of melanoma excision    back  . Hypertension   . Hypothyroidism   . Infection of spine ([place])    in hospial  . Macular degeneration    both eyes  . PAF (paroxysmal atrial fibrillation) ([place])   . Parastomal hernia   . Pneumonia   . PONV (postoperative nausea and vomiting)   . Pulmonary hypertension ([place])    PA systolic [id number] mmHg by echocardiogram 06-17-2015  PA pressure 05-08-2011 by cardiac catheterization -- no sig. pulm. htn)PA saturation 62% thermodilution cardiac index 2.0 thick cardiac index 2.4  . Pure hypercholesterolemia   . S/P drug eluting coronary stent placement 05/08/2011   x1 DES to proximal LAD  . Symptomatic cholelithiasis 07/18/2016  . Tubulovillous adenoma of colon   . Wears dentures    lower  . Wears glasses     Past Surgical History:  Procedure Laterality Date  . CARDIAC CATHETERIZATION Left 03/05/2015   Procedure: CENTRAL LINE INSERTION;  Surgeon: Aviva Signs, MD;  Location: [facility];  Service: General;  Laterality: Left;  . CATARACT EXTRACTION W/ INTRAOCULAR LENS  IMPLANT, BILATERAL  ~ 2002  . CHOLECYSTECTOMY N/A 07/18/2016   Procedure: LAPAROSCOPIC CHOLECYSTECTOMY WITH INTRAOPERATIVE CHOLANGIOGRAM;  Surgeon: [name], MD;  Location: [facility];  Service: General;  Laterality: N/A;  . COLECTOMY WITH COLOSTOMY CREATION/HARTMANN PROCEDURE N/A 03/05/2015   Procedure: PARTIAL COLECTOMY WITH COLOSTOMY CREATION/HARTMANN PROCEDURE;   Surgeon: Aviva Signs, MD;  Location: [facility];  Service: General;  Laterality: N/A;  . COLON SURGERY     colostomy bag  LLQ  . ESOPHAGEAL MANOMETRY N/A 06/06/2014   Procedure: ESOPHAGEAL MANOMETRY (EM);  Surgeon: Jerene Bears, MD;  Location: [facility];  Service: Gastroenterology;  Laterality: N/A;  . ESOPHAGOGASTRODUODENOSCOPY (EGD) WITH PROPOFOL N/A 08/04/2015   Procedure: ESOPHAGOGASTRODUODENOSCOPY (EGD) WITH PROPOFOL;  Surgeon: Mauri Pole, MD;  Location: [facility];  Service: Endoscopy;  Laterality: N/A;  . HERNIA REPAIR  2019   left side/ removed mass  . IR GENERIC HISTORICAL  03/04/2016   IR LUMBAR DISC ASPIRATION W/IMG GUIDE 03/04/2016 Luanne Bras, MD [facility]-INTERV RAD  . IR THORACENTESIS ASP PLEURAL SPACE W/IMG GUIDE  07/15/2018  . LAPAROSCOPIC NISSEN FUNDOPLICATION N/A 5/36/6440   Procedure: LAPAROSCOPIC  LYSIS OF ADHESIONS, SEGMENTAL GASTRECTOMY, PARTIAL REDUCTION OF HERNIA;  Surgeon: [name], MD;  Location: [facility];  Service: General;  Laterality: N/A;  . LAPAROSCOPIC PARTIAL COLECTOMY N/A 05/07/2017   Procedure: LAPAROSCOPIC  CONVERTED TO OPEN RIGHT COLECTOMY, PARASTOMAL HERNIA  REPAIR;  Surgeon: Leighton Ruff, MD;  Location: [facility];  Service: General;  Laterality: N/A;  .  NISSEN FUNDOPLICATION  4287  . PERCUTANEOUS CORONARY STENT INTERVENTION (PCI-S) N/A 05/08/2011   Procedure: PERCUTANEOUS CORONARY STENT INTERVENTION (PCI-S);  Surgeon: Wellington Hampshire, MD;  Location: [facility] CATH LAB;  Service: Cardiovascular;  Laterality: N/A;  . ROTATOR CUFF REPAIR  ~ 2001   right  . SHOULDER ARTHROSCOPY  ~ 2004; 2005   left; "joint's wore out"  . TONSILLECTOMY  ~ 1944  . TUBAL LIGATION  1972     reports that she quit smoking about 30 years ago. Her smoking use included cigarettes. She started smoking about 59 years ago. She has a 20.00 pack-year smoking history. She has never used smokeless tobacco. She reports that she does not drink alcohol or use drugs.  Allergies  Allergen  Reactions  . Sulfamethoxazole Rash    Family History  Problem Relation Age of Onset  . Heart attack Father   . Hypertension Father   . Hypertension Mother   . Skin cancer Brother        melanoma  . Atrial fibrillation Sister   . Skin cancer Sister   . Colon cancer Neg Hx   . Esophageal cancer Neg Hx   . Rectal cancer Neg Hx   . Stomach cancer Neg Hx    Prior to Admission medications   Medication Sig Start Date End Date Taking? Authorizing Provider  acetaminophen (TYLENOL) 325 MG tablet Take 2 tablets (650 mg total) by mouth every 6 (six) hours as needed for mild pain (or Fever >/= 101). 07/18/18  Yes Swayze, Ava, DO  albuterol (PROVENTIL HFA;VENTOLIN HFA) 108 (90 Base) MCG/ACT inhaler Inhale 2 puffs into the lungs every 6 (six) hours as needed for wheezing or shortness of breath. 04/15/18  Yes Terald Sleeper, PA-C  ALPRAZolam (XANAX) 0.25 MG tablet TAKEK 1/2 TO 1 TABLET 3 TIMES DAILY AS NEEDED Patient taking differently: Take 0.125-0.25 mg by mouth 3 (three) times daily as needed for anxiety.  01/30/18  Yes Terald Sleeper, PA-C  amiodarone (PACERONE) 200 MG tablet Take 1 tablet (200 mg total) by mouth daily. 07/19/18  Yes Swayze, Ava, DO  apixaban (ELIQUIS) 5 MG TABS tablet Take 1 tablet (5 mg total) by mouth 2 (two) times daily. 06/11/18  Yes Michael Boston, MD  citalopram (CELEXA) 40 MG tablet TAKE ONE (1) TABLET EACH DAY Patient taking differently: Take 40 mg by mouth daily.  01/30/18  Yes Terald Sleeper, PA-C  denosumab (PROLIA) 60 MG/ML SOSY injection Inject 60 mg into the skin every 6 (six) months. 02/18/18  Yes Terald Sleeper, PA-C  esomeprazole (NEXIUM) 40 MG capsule TAKE ONE (1) [place] Patient taking differently: Take 40 mg by mouth daily.  01/30/18  Yes Terald Sleeper, PA-C  ferrous sulfate 325 (65 FE) MG EC tablet Take 325 mg by mouth daily with breakfast.    Yes [provider]  fluticasone (FLONASE) 50 MCG/ACT nasal spray USE 2 SPRAYS IN EACH NOSTRIL DAILY  Patient taking differently: Place 2 sprays into both nostrils daily.  10/15/17  Yes Terald Sleeper, PA-C  hydrALAZINE (APRESOLINE) 50 MG tablet TAKE ONE (1) TABLET THREE (3) [place] Patient taking differently: Take 50 mg by mouth 3 (three) times daily.  01/30/18  Yes Terald Sleeper, PA-C  isosorbide mononitrate (IMDUR) 30 MG 24 hr tablet Take 1 tablet (30 mg total) by mouth every morning. 01/30/18  Yes Terald Sleeper, PA-C  levothyroxine (SYNTHROID, LEVOTHROID) 50 MCG tablet TAKE ONE TABLET EACH MORNING BEFORE BREAKFAST Patient  taking differently: Take 50 mcg by mouth daily before breakfast.  01/30/18  Yes Terald Sleeper, PA-C  loratadine (CLARITIN) 10 MG tablet Take 1 tablet (10 mg total) by mouth at bedtime. 01/30/18  Yes Terald Sleeper, PA-C  losartan (COZAAR) 100 MG tablet TAKE ONE (1) TABLET EACH DAY Patient taking differently: Take 100 mg by mouth daily.  01/30/18  Yes Terald Sleeper, PA-C  Meclizine HCl 25 MG CHEW TAKE 1 TABLET 3 TIMES DAILY AS NEEDED FOR DIZZINESS Patient taking differently: Chew 25 mg by mouth 3 (three) times daily as needed (dizziness).  10/15/17  Yes Terald Sleeper, PA-C  metoprolol succinate (TOPROL-XL) 25 MG 24 hr tablet Take 0.5 tablets (12.5 mg total) by mouth daily. 07/19/18  Yes Swayze, Ava, DO  montelukast (SINGULAIR) 10 MG tablet Take 1 tablet (10 mg total) by mouth at bedtime. 01/30/18  Yes Terald Sleeper, PA-C  Multiple Vitamins-Minerals (ICAPS AREDS 2 PO) Take 1 capsule by mouth 2 (two) times a day.   Yes [provider]  nitroGLYCERIN (NITROSTAT) 0.4 MG SL tablet Place 1 tablet (0.4 mg total) under the tongue every 5 (five) minutes as needed for chest pain. Reported on 04/26/2015 01/13/17  Yes Herminio Commons, MD  nystatin (MYCOSTATIN/NYSTOP) powder Apply topically 4 (four) times daily. Patient taking differently: Apply 1 Bottle topically as needed (yeast).  05/05/18  Yes Terald Sleeper, PA-C  ondansetron (ZOFRAN-ODT) 4 MG disintegrating tablet Take  1 tablet (4 mg total) by mouth every 6 (six) hours as needed for nausea or vomiting. 06/11/18  Yes Gross, Remo Lipps, MD  polyethylene glycol powder (GAVILAX) powder MIX 17 GRAMS IN 80Z OF WATER/JUICE AND DRINK TWICE DAILY Patient taking differently: Take 17 g by mouth daily.  10/15/17  Yes Terald Sleeper, PA-C  Propylene Glycol (SYSTANE BALANCE OP) Apply 1 drop to eye daily as needed (dry eyes).   Yes [provider]  rosuvastatin (CRESTOR) 10 MG tablet TAKE ONE (1) TABLET EACH DAY Patient taking differently: Take 10 mg by mouth daily.  01/30/18  Yes Terald Sleeper, PA-C  traMADol (ULTRAM) 50 MG tablet Take 1 tablet (50 mg total) by mouth every 12 (twelve) hours as needed. Patient taking differently: Take 50 mg by mouth daily as needed for moderate pain.  06/12/17  Yes Alla Feeling, NP  Vitamin D, Ergocalciferol, (DRISDOL) 50000 units CAPS capsule TAKE 1 CAPSULE ON TUESDAYS Patient taking differently: Take 50,000 Units by mouth every Tuesday.  09/11/17  Yes Terald Sleeper, PA-C  zolpidem (AMBIEN) 10 MG tablet Take 10 mg by mouth at bedtime.   Yes [provider]    Physical Exam: Vitals:   07/24/2018 1800 07/07/2018 1830 07/26/2018 1900 07/15/2018 2000  BP: 136/85 138/77 (!) 144/87   Pulse: 83 84 86 86  Resp: (!) 28 (!) 26 (!) 27 (!) 27  Temp:      TempSrc:      SpO2: 95% 96% 95% 96%  Weight:      Height:        Constitutional: Looks chronically ill, but currently in NAD, calm, comfortable Eyes: PERRL, lids and conjunctivae normal ENMT: Oxygen nasal cannula in place.  Mucous membranes are moist. Posterior pharynx clear of any exudate or lesions. Neck: normal, supple, no masses, no thyromegaly, no JVD. Respiratory: Tachypneic in the low to mid 20s bpm, bibasilar Rales, no rhonchi, no wheezing. No accessory muscle use.  Cardiovascular: Regular rate and rhythm, no murmurs / rubs / gallops.  1+ lower extremities pitting edema. 2+ pedal pulses. No carotid bruits.  Abdomen: Soft, no  tenderness, no masses palpated. No hepatosplenomegaly. Bowel sounds positive.  Musculoskeletal: no clubbing / cyanosis. Good ROM, no contractures. Normal muscle tone.  Skin: no rashes, lesions, ulcers on very limited dermatological examination. Neurologic: CN 2-12 grossly intact. Sensation intact, DTR normal. Strength 5/5 in all 4.  Psychiatric: Alert and oriented x 3. Normal mood.   Labs on Admission: I have personally reviewed following labs and imaging studies  CBC: Recent Labs  Lab 07/17/18 0525 07/18/18 0443 07/27/2018 1455  WBC 9.5 10.0 14.6*  NEUTROABS 6.8 7.9* 11.9*  HGB 9.1* 8.6* 10.0*  HCT 30.2* 29.4* 33.2*  MCV 84.1 83.5 83.8  PLT 317 299 326*   Basic Metabolic Panel: Recent Labs  Lab 07/16/18 0922 07/17/18 0525 07/18/18 0443 07/03/2018 1455  NA 136 132* 135 132*  K 4.6 4.3 4.9 4.6  CL 103 101 101 99  CO2 22 19* 24 23  GLUCOSE 113* 91 99 115*  BUN 13 15 15 21   CREATININE 1.37* 1.20* 1.15* 1.31*  CALCIUM 8.2* 7.6* 8.0* 8.4*   GFR: Estimated Creatinine Clearance: 30.3 mL/min (A) (by C-G formula based on SCr of 1.31 mg/dL (H)). Liver Function Tests: Recent Labs  Lab 07/16/18 0922 07/08/2018 1455  AST 25 40  ALT 17 28  ALKPHOS 70 138*  BILITOT 0.9 0.8  PROT 5.8* 6.9  ALBUMIN 2.5* 3.0*   No results for input(s): LIPASE, AMYLASE in the last 168 hours. No results for input(s): AMMONIA in the last 168 hours. Coagulation Profile: No results for input(s): INR, PROTIME in the last 168 hours. Cardiac Enzymes: Recent Labs  Lab 07/05/2018 1455 07/21/2018 1738  TROPONINI 0.06* 0.05*   BNP (last 3 results) No results for input(s): PROBNP in the last 8760 hours. HbA1C: No results for input(s): HGBA1C in the last 72 hours. CBG: No results for input(s): GLUCAP in the last 168 hours. Lipid Profile: No results for input(s): CHOL, HDL, LDLCALC, TRIG, CHOLHDL, LDLDIRECT in the last 72 hours. Thyroid Function Tests: No results for input(s): TSH, T4TOTAL, FREET4,  T3FREE, THYROIDAB in the last 72 hours. Anemia Panel: No results for input(s): VITAMINB12, FOLATE, FERRITIN, TIBC, IRON, RETICCTPCT in the last 72 hours. Urine analysis:    Component Value Date/Time   COLORURINE YELLOW 07/26/2018 1723   APPEARANCEUR CLEAR 07/12/2018 1723   APPEARANCEUR Clear 05/13/2016 0856   LABSPEC 1.012 07/04/2018 1723   PHURINE 5.0 07/16/2018 1723   GLUCOSEU NEGATIVE 07/29/2018 1723   HGBUR SMALL (A) 07/21/2018 1723   BILIRUBINUR NEGATIVE 07/04/2018 1723   BILIRUBINUR Negative 05/13/2016 0856   KETONESUR NEGATIVE 07/01/2018 1723   PROTEINUR NEGATIVE 07/24/2018 1723   UROBILINOGEN 0.2 02/10/2015 1936   NITRITE NEGATIVE 07/04/2018 1723   LEUKOCYTESUR NEGATIVE 07/04/2018 1723    Radiological Exams on Admission: Dg Chest Port 1 View  Result Date: 07/02/2018 CLINICAL DATA:  Shortness of breath EXAM: PORTABLE CHEST 1 VIEW COMPARISON:  07/15/2018 FINDINGS: Cardiac shadow is enlarged but stable. Bilateral pleural effusions are noted right greater than left with associated underlying atelectatic changes. Increased vascular congestion is noted as well. IMPRESSION: Increasing changes of CHF with bilateral effusions. Bibasilar atelectatic changes are as well. Electronically Signed   By: Inez Catalina M.D.   On: 07/21/2018 15:11   06/07/2018 echo  Indications:    Atrial Fibrillation 427.31   History:        Patient has prior history of Echocardiogram examinations, most  recent 05/29/2017. CHF, CAD, Pulmonary HTN; Risk Factors:                 Hypertension.  IMPRESSIONS   1. The left ventricle has normal systolic function with an ejection fraction of 60-65%. The cavity size was normal. Left ventricular diastolic Doppler parameters are indeterminate in the setting of atrial fibrillation. No obvious wall motion  abnormalities.  2. The right ventricle has normal systolic function. The cavity was normal. There is no increase in right ventricular wall thickness.  Right ventricular systolic pressure upper normal with an estimated pressure of 34.6 mmHg.  3. Left atrial size was moderately dilated.  4. The aortic valve is tricuspid. Mild calcification of the aortic valve. Moderate aortic annular calcification noted.  5. The mitral valve is normal in structure. Mitral valve regurgitation is mild to moderate by color flow Doppler.  6. The tricuspid valve is normal in structure.  7. The aortic root is normal in size and structure.  EKG: Independently reviewed.  Vent. rate 86 BPM PR interval * ms QRS duration 104 ms QT/QTc 421/504 ms P-R-T axes 60 90 231 Sinus rhythm Probable anterior infarct, age indeterminate Prolonged QT interval  Assessment/Plan Principal Problem:   Volume overload   Acute diastolic (congestive) heart failure (HCC) Observation/telemetry. Continue supplemental oxygen. Furosemide 20 mg IVP twice a day. Electrolyte supplementation as needed. Fluid restriction. Follow-up daily weights. Monitor intake and output. Continue losartan and hydralazine. Resume beta-blocker in a.m. once symptoms have improved.  Active Problems:   Prolonged QT interval Likely due to medications. Decrease use of QT prolonging medications. Magnesium sulfate 2 g IVPB given. EKG in a.m.    Elevated troponin Trend troponin level.    Leukocytosis No fever or clear source of infection. Monitor temperature. Follow-up WBC.    HYPERCHOLESTEROLEMIA Continue Crestor.    Coronary artery disease Continue Eliquis, low-dose and Crestor. Resume metoprolol in a.m. once feeling better.    Anemia due to chronic blood loss Monitor hematocrit and hemoglobin.    AF (paroxysmal atrial fibrillation) (HCC) CHA?DS?-VASc Score of at least 6. Continue amiodarone for rate control. Resume metoprolol in a.m. Eliquis for anticoagulation.    Anxiety and depression Continue citalopram.    Essential hypertension Continue hydralazine 50 mg p.o. 3 times daily.  Continue losartan 100 mg p.o. daily. Holding metoprolol for now. Follow-up blood pressure, heart rate and renal function.    GERD (gastroesophageal reflux disease)  s/p Toupet fundoplication (partial posterior wrap) Continue PPI.    Hypothyroidism Continue levothyroxine 50 mcg p.o. daily. Follow-up TSH as needed.   DVT prophylaxis: On Eliquis Code Status: Full code. Family Communication: Disposition Plan: Observation for volume overload treatment. Consults called: Admission status: Observation/telemetry.   Reubin Milan MD [facility]  07/06/2018, 8:14 PM   This document was prepared using Dragon voice recognition software and may contain some unintended transcription errors.

## 2018-07-22 NOTE — Telephone Encounter (Signed)
TC from [facility] OT with [facility],saw her today, nurse was also there this morning. Pt's resp were 28, breathing is shallow, she's SOB, O2 in the 90's while laying in bed but if she sits up O2 dropping to 87. Pt is not eating much, burping, not even keeping jello down. Has not had a BM in 5 days. Not able to walk to the bathroom.

## 2018-07-22 NOTE — ED Triage Notes (Addendum)
Pt was discharged from hospital Saturday and since she has been home she remains weak and SOB. Sats 86 % w EMS O2 increased to 4 L/min via [place]. Cough is dry at this time per pt. Pt negative for covid while at [facility]

## 2018-07-22 NOTE — ED Notes (Signed)
Date and time results received: 07/04/2018 1540 (use smartphrase ".now" to insert current time)  Test: Troponin Critical Value: 0.06  Name of Provider Notified:Dr Eulis Foster  Orders Received? Or Actions Taken?: NA

## 2018-07-22 NOTE — Telephone Encounter (Signed)
TC to [facility], Angel's recommendations was for patient to go back to the ED, which is where patient currently is.

## 2018-07-22 NOTE — ED Notes (Signed)
CRITICAL VALUE ALERT  Critical Value:  Troponin 0.05  Date & Time Notied:  07/27/2018 1850  Provider Notified: Dr. Eulis Foster   Orders Received/Actions taken: Admission

## 2018-07-22 NOTE — ED Notes (Signed)
Pt has a pressure ulcer to sacrum. 6 inches across. Stage 2.

## 2018-07-23 DIAGNOSIS — Z515 Encounter for palliative care: Secondary | ICD-10-CM | POA: Diagnosis present

## 2018-07-23 DIAGNOSIS — F329 Major depressive disorder, single episode, unspecified: Secondary | ICD-10-CM | POA: Diagnosis present

## 2018-07-23 DIAGNOSIS — R0603 Acute respiratory distress: Secondary | ICD-10-CM | POA: Diagnosis not present

## 2018-07-23 DIAGNOSIS — E8770 Fluid overload, unspecified: Secondary | ICD-10-CM | POA: Diagnosis not present

## 2018-07-23 DIAGNOSIS — I2510 Atherosclerotic heart disease of native coronary artery without angina pectoris: Secondary | ICD-10-CM | POA: Diagnosis present

## 2018-07-23 DIAGNOSIS — I480 Paroxysmal atrial fibrillation: Secondary | ICD-10-CM | POA: Diagnosis not present

## 2018-07-23 DIAGNOSIS — E039 Hypothyroidism, unspecified: Secondary | ICD-10-CM | POA: Diagnosis present

## 2018-07-23 DIAGNOSIS — Z85820 Personal history of malignant melanoma of skin: Secondary | ICD-10-CM | POA: Diagnosis not present

## 2018-07-23 DIAGNOSIS — D500 Iron deficiency anemia secondary to blood loss (chronic): Secondary | ICD-10-CM

## 2018-07-23 DIAGNOSIS — R7989 Other specified abnormal findings of blood chemistry: Secondary | ICD-10-CM | POA: Diagnosis not present

## 2018-07-23 DIAGNOSIS — R823 Hemoglobinuria: Secondary | ICD-10-CM | POA: Diagnosis present

## 2018-07-23 DIAGNOSIS — I130 Hypertensive heart and chronic kidney disease with heart failure and stage 1 through stage 4 chronic kidney disease, or unspecified chronic kidney disease: Secondary | ICD-10-CM | POA: Diagnosis present

## 2018-07-23 DIAGNOSIS — Z66 Do not resuscitate: Secondary | ICD-10-CM | POA: Diagnosis present

## 2018-07-23 DIAGNOSIS — Z7189 Other specified counseling: Secondary | ICD-10-CM | POA: Diagnosis not present

## 2018-07-23 DIAGNOSIS — I471 Supraventricular tachycardia: Secondary | ICD-10-CM | POA: Diagnosis present

## 2018-07-23 DIAGNOSIS — Z933 Colostomy status: Secondary | ICD-10-CM | POA: Diagnosis not present

## 2018-07-23 DIAGNOSIS — H3530 Unspecified macular degeneration: Secondary | ICD-10-CM | POA: Diagnosis present

## 2018-07-23 DIAGNOSIS — Z7901 Long term (current) use of anticoagulants: Secondary | ICD-10-CM | POA: Diagnosis not present

## 2018-07-23 DIAGNOSIS — D72829 Elevated white blood cell count, unspecified: Secondary | ICD-10-CM

## 2018-07-23 DIAGNOSIS — K449 Diaphragmatic hernia without obstruction or gangrene: Secondary | ICD-10-CM | POA: Diagnosis present

## 2018-07-23 DIAGNOSIS — I4819 Other persistent atrial fibrillation: Secondary | ICD-10-CM | POA: Diagnosis present

## 2018-07-23 DIAGNOSIS — C189 Malignant neoplasm of colon, unspecified: Secondary | ICD-10-CM | POA: Diagnosis present

## 2018-07-23 DIAGNOSIS — I5031 Acute diastolic (congestive) heart failure: Secondary | ICD-10-CM | POA: Diagnosis not present

## 2018-07-23 DIAGNOSIS — I509 Heart failure, unspecified: Secondary | ICD-10-CM | POA: Diagnosis present

## 2018-07-23 DIAGNOSIS — I482 Chronic atrial fibrillation, unspecified: Secondary | ICD-10-CM | POA: Diagnosis not present

## 2018-07-23 DIAGNOSIS — N261 Atrophy of kidney (terminal): Secondary | ICD-10-CM | POA: Diagnosis present

## 2018-07-23 DIAGNOSIS — I25119 Atherosclerotic heart disease of native coronary artery with unspecified angina pectoris: Secondary | ICD-10-CM | POA: Diagnosis not present

## 2018-07-23 DIAGNOSIS — E871 Hypo-osmolality and hyponatremia: Secondary | ICD-10-CM | POA: Diagnosis present

## 2018-07-23 DIAGNOSIS — R9431 Abnormal electrocardiogram [ECG] [EKG]: Secondary | ICD-10-CM | POA: Diagnosis not present

## 2018-07-23 DIAGNOSIS — K2270 Barrett's esophagus without dysplasia: Secondary | ICD-10-CM | POA: Diagnosis present

## 2018-07-23 DIAGNOSIS — J9621 Acute and chronic respiratory failure with hypoxia: Secondary | ICD-10-CM | POA: Diagnosis not present

## 2018-07-23 DIAGNOSIS — I4891 Unspecified atrial fibrillation: Secondary | ICD-10-CM | POA: Diagnosis not present

## 2018-07-23 DIAGNOSIS — K219 Gastro-esophageal reflux disease without esophagitis: Secondary | ICD-10-CM | POA: Diagnosis present

## 2018-07-23 DIAGNOSIS — F419 Anxiety disorder, unspecified: Secondary | ICD-10-CM | POA: Diagnosis present

## 2018-07-23 DIAGNOSIS — I5033 Acute on chronic diastolic (congestive) heart failure: Secondary | ICD-10-CM | POA: Diagnosis present

## 2018-07-23 DIAGNOSIS — N183 Chronic kidney disease, stage 3 (moderate): Secondary | ICD-10-CM | POA: Diagnosis present

## 2018-07-23 DIAGNOSIS — I252 Old myocardial infarction: Secondary | ICD-10-CM | POA: Diagnosis not present

## 2018-07-23 LAB — BASIC METABOLIC PANEL
Anion gap: 10 (ref 5–15)
BUN: 24 mg/dL — ABNORMAL HIGH (ref 8–23)
CO2: 23 mmol/L (ref 22–32)
Calcium: 8.1 mg/dL — ABNORMAL LOW (ref 8.9–10.3)
Chloride: 103 mmol/L (ref 98–111)
Creatinine, Ser: 1.33 mg/dL — ABNORMAL HIGH (ref 0.44–1.00)
GFR calc Af Amer: 43 mL/min — ABNORMAL LOW (ref >60)
GFR calc non Af Amer: 37 mL/min — ABNORMAL LOW (ref >60)
Glucose, Bld: 100 mg/dL — ABNORMAL HIGH (ref 70–99)
Potassium: 4.4 mmol/L (ref 3.5–5.1)
Sodium: 136 mmol/L (ref 135–145)

## 2018-07-23 LAB — TROPONIN I
Troponin I: 0.06 ng/mL (ref <0.03)
Troponin I: 0.05 ng/mL (ref <0.03)

## 2018-07-23 MED ORDER — ISOSORBIDE MONONITRATE ER 30 MG PO TB24
15.0000 mg | ORAL_TABLET | Freq: Every morning | ORAL | Status: DC
  Administered 2018-07-24: 15 mg via ORAL
  Filled 2018-07-23: qty 1

## 2018-07-23 MED ORDER — LOSARTAN POTASSIUM 50 MG PO TABS
50.0000 mg | ORAL_TABLET | Freq: Every day | ORAL | Status: DC
  Administered 2018-07-24: 50 mg via ORAL
  Filled 2018-07-23: qty 1

## 2018-07-23 MED ORDER — CITALOPRAM HYDROBROMIDE 20 MG PO TABS: 20.0000 mg | ORAL_TABLET | Freq: Every day | ORAL | Status: DC

## 2018-07-23 NOTE — Progress Notes (Signed)
PROGRESS NOTE    [name]  EHU:[id number]  DOB: 28-Nov-1934  DOA: 07/08/2018 PCP: Terald Sleeper, PA-C   Brief Admission Hx: 84 year old female with coronary artery disease and chronic atrial fibrillation recent NSTEMI, chronic diastolic CHF with 3 recent admissions now presents with weakness and shortness of breath, hypoxia and noted to have acute diastolic CHF exacerbation  MDM/Assessment & Plan:   1. Acute diastolic CHF exacerbation with mild volume overload-patient is responding to diuresis with IV Lasix.  We are being very gentle monitoring her renal function closely.  She has been evaluated by the PT service and they are recommending SNF placement as she is very weakened by her recent admissions and heart condition. 2. Prolonged QT interval- likely due to medications and she has been taken off Celexa she has been loaded with magnesium and will have an EKG in the morning to reassess.  She is on continuous telemetry at this time. 3. Elevated troponin- mild elevation secondary to demand ischemia associated with congestive heart failure exacerbation do not suspect ACS. 4. Leukocytosis- no signs or symptoms of infection found.  Repeat CBC with differential in a.m. 5. Hyponatremia-likely secondary to volume overload-sodium has improved with diuresis. 6. Chronic atrial fibrillation-she is rate controlled now on amiodarone and Toprol-XL. 7. Essential hypertension- her blood pressures are soft on her cardiac regimen.  I am going to hold the hydralazine at this time.  We may need to further reduce some of her cardiac medications due to soft blood pressures.  We will follow and make adjustments as needed.  The patient is fully anticoagulated with apixaban. 8. Hypothyroidism -patient is on levothyroxine 50 mcg daily which we have continued.  DVT prophylaxis: Apixaban Code Status: Full Family Communication: daughter Helene Kelp Disposition Plan: SNF placement    Consultants:    Procedures:     Antimicrobials:     Subjective: Patient reports that she is feeling better.  She is diuresing.  She does not have as much shortness of breath with resting but she is very short of breath when trying to ambulate.  She denies having any chest pain symptoms.  Objective: Vitals:   07/23/18 0500 07/23/18 0551 07/23/18 1325 07/23/18 1327  BP:  (!) 151/83 (!) 80/66 97/69  Pulse:  91 79 83  Resp:  (!) 24 20   Temp:  98.4 F (36.9 C) 98.3 F (36.8 C)   TempSrc:  Oral Oral   SpO2:  93% 96%   Weight: 69.2 kg     Height:        Intake/Output Summary (Last 24 hours) at 07/23/2018 1425 Last data filed at 07/23/2018 7858 Gross per 24 hour  Intake 120.16 ml  Output 275 ml  Net -154.84 ml   Filed Weights   07/24/2018 1428 07/23/18 0500  Weight: 67 kg 69.2 kg     REVIEW OF SYSTEMS  As per history otherwise all reviewed and reported negative  Exam:  General exam: Elderly female sitting up in the chair in no apparent distress.  She is cooperative. Respiratory system: Clear. No increased work of breathing. Cardiovascular system: S1 & S2 heard. No JVD, murmurs, gallops, clicks or pedal edema. Gastrointestinal system: Abdomen is nondistended, soft and nontender. Normal bowel sounds heard. Central nervous system: Alert and oriented. No focal neurological deficits. Extremities: 1+ pretibial edema.  Data Reviewed: Basic Metabolic Panel: Recent Labs  Lab 07/17/18 0525 07/18/18 0443 07/19/2018 1455 07/23/18 0626  NA 132* 135 132* 136  K 4.3 4.9 4.6 4.4  CL 101 101 99 103  CO2 19* 24 23 23   GLUCOSE 91 99 115* 100*  BUN 15 15 21  24*  CREATININE 1.20* 1.15* 1.31* 1.33*  CALCIUM 7.6* 8.0* 8.4* 8.1*  MG  --   --  2.2  --    Liver Function Tests: Recent Labs  Lab 07/17/2018 1455  AST 40  ALT 28  ALKPHOS 138*  BILITOT 0.8  PROT 6.9  ALBUMIN 3.0*   No results for input(s): LIPASE, AMYLASE in the last 168 hours. No results for input(s): AMMONIA in the last 168 hours. CBC: Recent  Labs  Lab 07/17/18 0525 07/18/18 0443 07/14/2018 1455  WBC 9.5 10.0 14.6*  NEUTROABS 6.8 7.9* 11.9*  HGB 9.1* 8.6* 10.0*  HCT 30.2* 29.4* 33.2*  MCV 84.1 83.5 83.8  PLT 317 299 404*   Cardiac Enzymes: Recent Labs  Lab 07/14/2018 1455 07/21/2018 1738 07/23/18 0038 07/23/18 0626  TROPONINI 0.06* 0.05* 0.06* 0.05*   CBG (last 3)  No results for input(s): GLUCAP in the last 72 hours. Recent Results (from the past 240 hour(s))  SARS Coronavirus 2 [facility] order, Performed in [facility] hospital lab)     Status: None   Collection Time: 07/14/18  1:59 AM  Result Value Ref Range Status   SARS Coronavirus 2 NEGATIVE NEGATIVE Final    Comment: (NOTE) If result is NEGATIVE SARS-CoV-2 target nucleic acids are NOT DETECTED. The SARS-CoV-2 RNA is generally detectable in upper and lower  respiratory specimens during the acute phase of infection. The lowest  concentration of SARS-CoV-2 viral copies this assay can detect is 250  copies / mL. A negative result does not preclude SARS-CoV-2 infection  and should not be used as the sole basis for treatment or other  patient management decisions.  A negative result may occur with  improper specimen collection / handling, submission of specimen other  than nasopharyngeal swab, presence of viral mutation(s) within the  areas targeted by this assay, and inadequate number of viral copies  (<250 copies / mL). A negative result must be combined with clinical  observations, patient history, and epidemiological information. If result is POSITIVE SARS-CoV-2 target nucleic acids are DETECTED. The SARS-CoV-2 RNA is generally detectable in upper and lower  respiratory specimens dur ing the acute phase of infection.  Positive  results are indicative of active infection with SARS-CoV-2.  Clinical  correlation with patient history and other diagnostic information is  necessary to determine patient infection status.  Positive results do  not rule out  bacterial infection or co-infection with other viruses. If result is PRESUMPTIVE POSTIVE SARS-CoV-2 nucleic acids MAY BE PRESENT.   A presumptive positive result was obtained on the submitted specimen  and confirmed on repeat testing.  While 2019 novel coronavirus  (SARS-CoV-2) nucleic acids may be present in the submitted sample  additional confirmatory testing may be necessary for epidemiological  and / or clinical management purposes  to differentiate between  SARS-CoV-2 and other Sarbecovirus currently known to infect humans.  If clinically indicated additional testing with an alternate test  methodology [phone]) is advised. The SARS-CoV-2 RNA is generally  detectable in upper and lower respiratory sp ecimens during the acute  phase of infection. The expected result is Negative. Fact Sheet for Patients:  StrictlyIdeas.no Fact Sheet for Healthcare Providers: BankingDealers.co.za This test is not yet approved or cleared by the [place] FDA and has been authorized for detection and/or diagnosis of SARS-CoV-2 by FDA under an Emergency Use Authorization (EUA).  This EUA will remain in effect (meaning this test can be used) for the duration of the COVID-19 declaration under Section 564(b)(1) of the Act, 21 U.S.C. section 360bbb-3(b)(1), unless the authorization is terminated or revoked sooner. Performed at [facility], [place] [address] [place], [place] [address]   Culture, blood (routine x 2)     Status: None   Collection Time: 07/14/18  6:10 PM  Result Value Ref Range Status   Specimen Description BLOOD LEFT ANTECUBITAL  Final   Special Requests   Final    BOTTLES DRAWN AEROBIC AND ANAEROBIC Blood Culture adequate volume   Culture   Final    NO GROWTH 5 DAYS Performed at [facility], [address] [place], [place] [address]    Report Status 07/19/2018 FINAL  Final  Culture, blood (routine x 2)     Status: None    Collection Time: 07/14/18  6:10 PM  Result Value Ref Range Status   Specimen Description BLOOD BLOOD LEFT WRIST  Final   Special Requests   Final    BOTTLES DRAWN AEROBIC AND ANAEROBIC Blood Culture adequate volume   Culture   Final    NO GROWTH 5 DAYS Performed at [facility], [address], [place], [place] [address]    Report Status 07/19/2018 FINAL  Final  Gram stain     Status: None   Collection Time: 07/15/18 10:27 AM  Result Value Ref Range Status   Specimen Description PLEURAL RIGHT  Final   Special Requests NONE  Final   Gram Stain   Final    RARE WBC PRESENT, PREDOMINANTLY MONONUCLEAR NO ORGANISMS SEEN Performed at [facility], [place] [address] [place], [place] [address]    Report Status 07/15/2018 FINAL  Final  Culture, body fluid-bottle     Status: None   Collection Time: 07/15/18 10:27 AM  Result Value Ref Range Status   Specimen Description PLEURAL RIGHT  Final   Special Requests NONE  Final   Culture   Final    NO GROWTH 5 DAYS Performed at [place] [address], [place], [place] [address]    Report Status 07/20/2018 FINAL  Final  Culture, blood (routine x 2)     Status: None (Preliminary result)   Collection Time: 07/18/2018  3:15 PM  Result Value Ref Range Status   Specimen Description RIGHT ANTECUBITAL  Final   Special Requests   Final    BOTTLES DRAWN AEROBIC AND ANAEROBIC Blood Culture adequate volume   Culture   Final    NO GROWTH < 24 HOURS Performed at [facility], [address] [place], [place] [address]    Report Status PENDING  Incomplete  Culture, blood (routine x 2)     Status: None (Preliminary result)   Collection Time: 07/19/2018  3:15 PM  Result Value Ref Range Status   Specimen Description BLOOD RIGHT WRIST  Final   Special Requests   Final    BOTTLES DRAWN AEROBIC AND ANAEROBIC Blood Culture adequate volume   Culture   Final    NO GROWTH < 24 HOURS Performed at [facility], [address] [place], [place] [address]     Report Status PENDING  Incomplete     Studies: Dg Chest Port 1 View  Result Date: 07/12/2018 CLINICAL DATA:  Shortness of breath EXAM: PORTABLE CHEST 1 VIEW COMPARISON:  07/15/2018 FINDINGS: Cardiac shadow is enlarged but stable. Bilateral pleural effusions are noted right greater than left with  associated underlying atelectatic changes. Increased vascular congestion is noted as well. IMPRESSION: Increasing changes of CHF with bilateral effusions. Bibasilar atelectatic changes are as well. Electronically Signed   By: Inez Catalina M.D.   On: 07/18/2018 15:11     Scheduled Meds: . amiodarone  200 mg Oral Daily  . apixaban  5 mg Oral BID  . ferrous sulfate  325 mg Oral Q breakfast  . fluticasone  2 spray Each Nare Daily  . furosemide  20 mg Intravenous BID  . isosorbide mononitrate  30 mg Oral q morning - 10a  . levothyroxine  50 mcg Oral Q0600  . loratadine  10 mg Oral QHS  . losartan  100 mg Oral Daily  . metoprolol succinate  12.5 mg Oral Daily  . montelukast  10 mg Oral QHS  . rosuvastatin  10 mg Oral q1800  . zolpidem  5 mg Oral QHS   Continuous Infusions: . sodium chloride Stopped (07/05/2018 2016)    Principal Problem:   Volume overload Active Problems:   HYPERCHOLESTEROLEMIA   Coronary artery disease   Prolonged QT interval   Anemia due to chronic blood loss   AF (paroxysmal atrial fibrillation) (HCC)   Anxiety and depression   Acute diastolic (congestive) heart failure (HCC)   Essential hypertension   GERD (gastroesophageal reflux disease) s/p Toupet fundoplication (partial posterior wrap)   Hypothyroidism   Elevated troponin   Leukocytosis   Time spent:   Irwin Brakeman, MD [facility] 07/23/2018, 2:25 PM    LOS: 0 days  How to contact the [facility] Attending or Consulting provider [place] or covering provider during after hours [place], for this patient?  1. Check the care team in [facility] and look for a) attending/consulting TRH provider listed and b) the  [facility] team listed 2. Log into [email or web address] and use [place]'s universal password to access. If you do not have the password, please contact the hospital operator. 3. Locate the [facility] provider you are looking for under [facility] and page to a number that you can be directly reached. 4. If you still have difficulty reaching the provider, please page the [facility] (Director on Call) for the Hospitalists listed on amion for assistance.

## 2018-07-23 NOTE — Progress Notes (Signed)
Mobility Note  Patient Details  Name: [name] MRN: [id number] Date of Birth: 1934/12/05 Today's Date: 07/23/2018   Pt assisted from chair to bed with use of RW and min A for safety.  Pt left in bed with call bell within reach and warm blanket cover to address coldness.  [address], LPTA; [place]   [name], [name] 07/23/2018, 12:41 PM

## 2018-07-23 NOTE — Progress Notes (Addendum)
CRITICAL VALUE ALERT  Critical Value:  0.06  Date & Time Notied: 07/23/2018 2:00 AM  Provider Notified: dr Olevia Bowens  Orders Received/Actions taken: no new orders

## 2018-07-23 NOTE — TOC Initial Note (Signed)
Transition of Care [facility]) - Initial/Assessment Note    Patient Details  Name: Pernell Dikes Lightsey MRN: [id number] Date of Birth: Dec 26, 1934  Transition of Care [facility]) CM/SW Contact:    Ihor Gully, LCSW Phone Number: 07/23/2018, 5:03 PM  Clinical Narrative:                 Patient is active with [facility] with RN, PT, OT. She has a walker but does not use it. At baseline, she lives alone, ambulates independently. She is agreeable to SNF for short term rehab. Referrals were sent to facilities of choice.    Expected Discharge Plan: Skilled Nursing Facility Barriers to Discharge: No Barriers Identified   Patient Goals and CMS Choice Patient states their goals for this hospitalization and ongoing recovery are:: To go to rehab and go home  CMS Medicare.gov Compare Post Acute Care list provided to:: Patient Choice offered to / list presented to : Patient  Expected Discharge Plan and Services Expected Discharge Plan: [place] In-house Referral: NA Discharge Planning Services: NA Post Acute Care Choice: Nursing Home Living arrangements for the past 2 months: [place] Agency: [facility] (formerly [facility])        Prior Living Arrangements/Services Living arrangements for the past 2 months: [place] with:: Self Patient language and need for interpreter reviewed:: No Do you feel safe going back to the place where you live?: Yes      Need for Family Participation in Patient Care: Yes (Comment) Care giver support system in place?: Yes (comment) Current home services: Home OT, Home PT, Home RN Criminal Activity/Legal Involvement Pertinent to Current Situation/Hospitalization: No - Comment as needed  Activities of Daily Living Home Assistive Devices/Equipment: [place] Rile (specify type) ADL Screening (condition at time of admission) Patient's cognitive ability adequate to safely complete daily  activities?: Yes Is the patient deaf or have difficulty hearing?: No Does the patient have difficulty seeing, even when wearing glasses/contacts?: No Does the patient have difficulty concentrating, remembering, or making decisions?: No Patient able to express need for assistance with ADLs?: Yes Does the patient have difficulty dressing or bathing?: No Independently performs ADLs?: Yes (appropriate for developmental age) Does the patient have difficulty walking or climbing stairs?: No Weakness of Legs: None Weakness of Arms/Hands: None  Permission Sought/Granted                  Emotional Assessment   Attitude/Demeanor/Rapport: Engaged, Gracious Affect (typically observed): Accepting, Appropriate Orientation: : Oriented to Self, Oriented to Place, Oriented to  Time, Oriented to Situation Alcohol / Substance Use: Not Applicable Psych Involvement: No (comment)  Admission diagnosis:  Acute congestive heart failure, unspecified heart failure type [facility]) [I50.9] Patient Active Problem List   Diagnosis Date Noted  . Leukocytosis 07/23/2018  . Volume overload 07/29/2018  . Elevated troponin 07/13/2018  . HCAP (healthcare-associated pneumonia) 07/16/2018  . Pleural effusion 07/14/2018  . Weakness generalized 06/14/2018  . Renal failure (ARF), acute on chronic ([place]) 06/14/2018  . Non-STEMI (non-ST elevated myocardial infarction) ([place]) 06/11/2018  . Current use of long term anticoagulation 06/11/2018  . Incarcerated recurrent hiatal hernia s/p robotic repair 06/05/2018 06/05/2018  . GERD (gastroesophageal reflux disease) s/p Toupet fundoplication (partial posterior wrap) 06/05/2018  . S/P robotic partial posterior (Toupet) fundoplication 06/04/91 81/82/9937  .  Pulmonary hypertension ([place])   . Hypothyroidism   . History of [name]'s esophagus   . Anxiety state   . Acquired hypothyroidism 01/30/2018  . Cancer of right colon ([place]) 05/20/2017  . Irritable bowel syndrome with  constipation 10/15/2016  . Iron deficiency 08/13/2016  . Discitis of lumbar region L5- S1 03/01/2016  . Chronic diastolic CHF (congestive heart failure) ([place]) 03/01/2016  . DDD (degenerative disc disease), lumbar 02/12/2016  . History of GI bleed 12/18/2015  . Dehydration 08/29/2015  . Gastric volvulus 08/29/2015  . Erosive esophagitis 08/29/2015  . Atrial fibrillation with RVR ([place]) 08/09/2015  . TIA (transient ischemic attack)   . Acute diastolic (congestive) heart failure ([place]) 06/17/2015  . Pressure ulcer 06/17/2015  . Essential hypertension   . Colonic mass 05/02/2015  . Anemia due to chronic blood loss 05/02/2015  . AF (paroxysmal atrial fibrillation) ([place]) 05/02/2015  . Anxiety and depression 05/02/2015  . Prolonged QT interval 03/03/2015  . CKD (chronic kidney disease) stage 3, GFR 30-59 ml/min (HCC) 03/03/2015  . History of percutaneous coronary intervention   . Coronary artery disease   . HYPERCHOLESTEROLEMIA 07/16/2007  . Major depressive disorder, single episode, unspecified 07/16/2007   PCP:  Terald Sleeper, PA-C Pharmacy:   [place], [place] [address] Phone: [phone] Fax: [phone]     Social Determinants of Health (SDOH) Interventions    Readmission Risk Interventions Readmission Risk Prevention Plan 07/15/2018 06/17/2018 06/17/2018  Transportation Screening Complete Complete Complete  Medication Review Press photographer) Complete Complete Complete  PCP or Specialist appointment within 3-5 days of discharge Complete Complete Complete  HRI or Home Care Consult Complete Complete -  SW Recovery Care/Counseling Consult Not Complete Complete -  SW Consult Not Complete Comments NA - -  Palliative Care Screening Not Applicable Not Applicable -  [place] - Patient Refused Patient Refused  Some recent data might be hidden

## 2018-07-23 NOTE — Plan of Care (Signed)
  Problem: Acute Rehab PT Goals(only PT should resolve) Goal: Pt Will Go Supine/Side To Sit Outcome: Progressing Flowsheets (Taken 07/23/2018 1107) Pt will go Supine/Side to Sit: with modified independence Goal: Patient Will Transfer Sit To/From Stand Outcome: Progressing Flowsheets (Taken 07/23/2018 1107) Patient will transfer sit to/from stand: with min guard assist Goal: Pt Will Transfer Bed To Chair/Chair To Bed Outcome: Progressing Flowsheets (Taken 07/23/2018 1107) Pt will Transfer Bed to Chair/Chair to Bed: min guard assist Goal: Pt Will Ambulate Outcome: Progressing Flowsheets (Taken 07/23/2018 1107) Pt will Ambulate: 50 feet; with rolling walker; with min guard assist   11:08 AM, 07/23/18 Lonell Grandchild, MPT Physical Therapist with [facility] [phone] office [phone] mobile phone

## 2018-07-23 NOTE — Evaluation (Signed)
Physical Therapy Evaluation Patient Details Name: [name] MRN: [id number] DOB: [birth year] Today's Date: 07/23/2018   History of Present Illness  [name] is a 84 y.o. female with medical history significant of chronic blood loss anemia, anxiety, depression, atrophy of the left kidney, melanoma, right colon cancer, stage III chronic kidney disease, GERD, hiatal hernia, [name]'s esophagus, hypertension, hypothyroidism, macular degeneration, pneumonia, pulmonary hypertension, paroxysmal atrial fibrillation, CAD, history of NSTEMI, chronic diastolic CHF who has been admitted 3 times in the past 6 weeks, initially for robotic repair of incarcerated recurrent hiatal hernia that was complicated by postop A. fib with marked "asymptomatic" elevation of troponin to 13.58 and started on amiodarone and Eliquis.  She was readmitted a few days later after discharge due to paroxysmal A. fib with RVR and acute kidney injury.  She was supposed to have TEE cardioversion, but subsequently converted to sinus rhythm.  She was readmitted on 07/14/2018 for a total of 4 days after presenting there with dyspnea and clear or whitish sputum producing cough.  COVID-19 test was negative.  Today she presents again to the emergency department with complaints of still feeling weak and dyspneic.  Her O2 sat was 86% when EMS arrived.  This improved with nasal cannula oxygen.  She denies fever, sore throat, wheezing or hemoptysis.  She denies chest pain, palpitations, dizziness, diaphoresis, but complains of lower extremity edema, PND and orthopnea.  She denies abdominal pain, nausea, emesis, diarrhea, constipation, melena or hematochezia.  No dysuria, frequency or hematuria.    Clinical Impression  Patient limited to a few steps at bedside mostly due to poor standing balance, fatigue and c/o lightheadedness that worsens when standing.  Patient able to transfer to [facility] prior to attempting gait training, SpO2 dropped below 85% while on  room air and put back on 4 LPM during treatment with SpO2 remaining WNL.  Patient c/o increased lightheadedness when standing, had sitting BP 115/85, standing BP 113/66, declined to attempt ambulation away from bedside and tolerated sitting up in chair after therapy.  Patient will benefit from continued physical therapy in hospital and recommended venue below to increase strength, balance, endurance for safe ADLs and gait.    Follow Up Recommendations SNF;Supervision/Assistance - 24 hour;Supervision for mobility/OOB    Equipment Recommendations  Rolling walker with 5" wheels    Recommendations for Other Services       Precautions / Restrictions Precautions Precautions: Fall Restrictions Weight Bearing Restrictions: No      Mobility  Bed Mobility Overal bed mobility: Needs Assistance Bed Mobility: Supine to Sit     Supine to sit: Min guard     General bed mobility comments: increased time  Transfers Overall transfer level: Needs assistance Equipment used: Rolling walker (2 wheeled);None Transfers: Sit to/from [employer] to Stand: Min assist Stand pivot transfers: Min assist       General transfer comment: unsteady on feet with near loss of balance without use of AD, required RW for safety  Ambulation/Gait Ambulation/Gait assistance: Min assist Gait Distance (Feet): 5 Feet Assistive device: Rolling walker (2 wheeled) Gait Pattern/deviations: Decreased step length - right;Decreased step length - left;Decreased stride length Gait velocity: decreased   General Gait Details: limited to 4-5 steps at bedside mostly due to c/o lightheadedness and fatigue  Stairs            Wheelchair Mobility    Modified Rankin (Stroke Patients Only)       Balance Overall balance assessment: Needs assistance Sitting-balance  support: Feet supported;No upper extremity supported Sitting balance-Leahy Scale: Fair     Standing balance support: No upper  extremity supported;During functional activity Standing balance-Leahy Scale: Poor Standing balance comment: fair/poor without AD, fair using RW                             Pertinent Vitals/Pain Pain Assessment: No/denies pain    Home Living Family/patient expects to be discharged to:: Private residence Living Arrangements: Alone Available Help at Discharge: Family;Friend(s);Available PRN/intermittently Type of Home: Apartment Home Access: Level entry     Home Layout: One level Home Equipment: Walker - 2 wheels;Walker - 4 wheels;Cane - single point;Bedside commode;Shower seat Additional Comments: Colostomy bag in right lower quadrant of abdomen    Prior Function Level of Independence: Independent         Comments: Hydrographic surveyor, drives     Hand Dominance   Dominant Hand: Right    Extremity/Trunk Assessment   Upper Extremity Assessment Upper Extremity Assessment: Generalized weakness    Lower Extremity Assessment Lower Extremity Assessment: Generalized weakness    Cervical / Trunk Assessment Cervical / Trunk Assessment: Normal  Communication   Communication: No difficulties  Cognition Arousal/Alertness: Awake/alert Behavior During Therapy: WFL for tasks assessed/performed Overall Cognitive Status: Within Functional Limits for tasks assessed                                        General Comments      Exercises     Assessment/Plan    PT Assessment Patient needs continued PT services  PT Problem List Decreased strength;Decreased activity tolerance;Decreased balance;Decreased mobility       PT Treatment Interventions Therapeutic exercise;Gait training;Stair training;Functional mobility training;Therapeutic activities;Patient/family education    PT Goals (Current goals can be found in the Care Plan section)  Acute Rehab PT Goals Patient Stated Goal: return home after rehab PT Goal Formulation: With patient Time For  Goal Achievement: 08/06/18 Potential to Achieve Goals: Good    Frequency Min 3X/week   Barriers to discharge        Co-evaluation               AM-PAC PT "6 Clicks" Mobility  Outcome Measure Help needed turning from your back to your side while in a flat bed without using bedrails?: None Help needed moving from lying on your back to sitting on the side of a flat bed without using bedrails?: A Little Help needed moving to and from a bed to a chair (including a wheelchair)?: A Little Help needed standing up from a chair using your arms (e.g., wheelchair or bedside chair)?: A Little Help needed to walk in hospital room?: A Lot Help needed climbing 3-5 steps with a railing? : A Lot 6 Click Score: 17    End of Session Equipment Utilized During Treatment: Gait belt;Oxygen Activity Tolerance: Patient tolerated treatment well;Patient limited by fatigue Patient left: in chair;with call bell/phone within reach Nurse Communication: Mobility status PT Visit Diagnosis: Unsteadiness on feet (R26.81);Other abnormalities of gait and mobility (R26.89);Muscle weakness (generalized) (M62.81)    Time: 1245-8099 PT Time Calculation (min) (ACUTE ONLY): 36 min   Charges:   PT Evaluation $PT Eval Moderate Complexity: 1 Mod PT Treatments $Therapeutic Activity: 23-37 mins        11:05 AM, 07/23/18 Lonell Grandchild, MPT Physical Therapist with [facility]  [place] office [phone] mobile phone

## 2018-07-23 NOTE — NC FL2 (Signed)
[place] MEDICAID FL2 LEVEL OF CARE SCREENING TOOL     IDENTIFICATION  Patient Name: [name] Birthdate: [birth year] Sex: female Admission Date (Current Location): 07/27/2018  [facility] and [place] Number:  [employer] and Address:  [facility] [address], [place]      Provider Number: [id number]  Attending Physician Name and Address:  Murlean Iba, MD  Relative Name and Phone Number:       Current Level of Care: Hospital Recommended Level of Care: [place] Prior Approval Number:    Date Approved/Denied:   PASRR Number:    Discharge Plan: SNF    Current Diagnoses: Patient Active Problem List   Diagnosis Date Noted  . Leukocytosis 07/23/2018  . Volume overload 07/03/2018  . Elevated troponin 07/27/2018  . HCAP (healthcare-associated pneumonia) 07/16/2018  . Pleural effusion 07/14/2018  . Weakness generalized 06/14/2018  . Renal failure (ARF), acute on chronic ([place]) 06/14/2018  . Non-STEMI (non-ST elevated myocardial infarction) ([place]) 06/11/2018  . Current use of long term anticoagulation 06/11/2018  . Incarcerated recurrent hiatal hernia s/p robotic repair 06/05/2018 06/05/2018  . GERD (gastroesophageal reflux disease) s/p Toupet fundoplication (partial posterior wrap) 06/05/2018  . S/P robotic partial posterior (Toupet) fundoplication 07/06/6544 50/35/4656  . Pulmonary hypertension ([place])   . Hypothyroidism   . History of [name]'s esophagus   . Anxiety state   . Acquired hypothyroidism 01/30/2018  . Cancer of right colon ([place]) 05/20/2017  . Irritable bowel syndrome with constipation 10/15/2016  . Iron deficiency 08/13/2016  . Discitis of lumbar region L5- S1 03/01/2016  . Chronic diastolic CHF (congestive heart failure) ([place]) 03/01/2016  . DDD (degenerative disc disease), lumbar 02/12/2016  . History of GI bleed 12/18/2015  . Dehydration 08/29/2015  . Gastric volvulus 08/29/2015  . Erosive  esophagitis 08/29/2015  . Atrial fibrillation with RVR ([place]) 08/09/2015  . TIA (transient ischemic attack)   . Acute diastolic (congestive) heart failure ([place]) 06/17/2015  . Pressure ulcer 06/17/2015  . Essential hypertension   . Colonic mass 05/02/2015  . Anemia due to chronic blood loss 05/02/2015  . AF (paroxysmal atrial fibrillation) ([place]) 05/02/2015  . Anxiety and depression 05/02/2015  . Prolonged QT interval 03/03/2015  . CKD (chronic kidney disease) stage 3, GFR 30-59 ml/min (HCC) 03/03/2015  . History of percutaneous coronary intervention   . Coronary artery disease   . HYPERCHOLESTEROLEMIA 07/16/2007  . Major depressive disorder, single episode, unspecified 07/16/2007    Orientation RESPIRATION BLADDER Height & Weight     Self, Time, Situation, Place  O2(4L) Continent Weight: 152 lb 8.9 oz (69.2 kg) Height:  5\' 2"  (157.5 cm)  BEHAVIORAL SYMPTOMS/MOOD NEUROLOGICAL BOWEL NUTRITION STATUS      Colostomy Diet(heart healthy)  AMBULATORY STATUS COMMUNICATION OF NEEDS Skin   Limited Assist Verbally Normal                       Personal Care Assistance Level of Assistance  Bathing, Feeding, Dressing Bathing Assistance: Limited assistance Feeding assistance: Independent Dressing Assistance: Limited assistance     Functional Limitations Info             SPECIAL CARE FACTORS FREQUENCY  PT (By licensed PT)     PT Frequency: 5x/week              Contractures Contractures Info: Not present    Additional Factors Info  Code Status, Allergies, Psychotropic Code Status Info: Full Code Allergies Info: Sulfamethoxazole  Psychotropic Info: Xanax, Celexa         Current Medications (07/23/2018):  This is the current hospital active medication list Current Facility-Administered Medications  Medication Dose Route Frequency Provider Last Rate Last Dose  . 0.9 %  sodium chloride infusion   Intravenous PRN Reubin Milan, MD   Stopped at 07/17/2018 2016  .  acetaminophen (TYLENOL) tablet 650 mg  650 mg Oral Q6H PRN Reubin Milan, MD       Or  . acetaminophen (TYLENOL) suppository 650 mg  650 mg Rectal Q6H PRN Reubin Milan, MD      . albuterol (PROVENTIL) (2.5 MG/3ML) 0.083% nebulizer solution 3 mL  3 mL Inhalation Q6H PRN Reubin Milan, MD      . ALPRAZolam Duanne Moron) tablet 0.125-0.25 mg  0.125-0.25 mg Oral TID PRN Reubin Milan, MD   0.25 mg at 07/17/2018 2134  . amiodarone (PACERONE) tablet 200 mg  200 mg Oral Daily Reubin Milan, MD   200 mg at 07/23/18 0848  . apixaban (ELIQUIS) tablet 5 mg  5 mg Oral BID Reubin Milan, MD   5 mg at 07/23/18 0848  . ferrous sulfate tablet 325 mg  325 mg Oral Q breakfast Reubin Milan, MD   325 mg at 07/23/18 0847  . fluticasone (FLONASE) 50 MCG/ACT nasal spray 2 spray  2 spray Each Nare Daily Reubin Milan, MD   2 spray at 07/23/18 0848  . furosemide (LASIX) injection 20 mg  20 mg Intravenous BID Reubin Milan, MD   20 mg at 07/23/18 1632  . [START ON 07/24/2018] isosorbide mononitrate (IMDUR) 24 hr tablet 15 mg  15 mg Oral q morning - 10a Johnson, Clanford L, MD      . levothyroxine (SYNTHROID) tablet 50 mcg  50 mcg Oral Q0600 Reubin Milan, MD   50 mcg at 07/23/18 0543  . loratadine (CLARITIN) tablet 10 mg  10 mg Oral QHS Reubin Milan, MD   10 mg at 07/02/2018 2135  . [START ON 07/24/2018] losartan (COZAAR) tablet 50 mg  50 mg Oral Daily Johnson, Clanford L, MD      . meclizine (ANTIVERT) tablet 25 mg  25 mg Oral TID PRN Reubin Milan, MD      . metoprolol succinate (TOPROL-XL) 24 hr tablet 12.5 mg  12.5 mg Oral Daily Reubin Milan, MD   12.5 mg at 07/23/18 0848  . montelukast (SINGULAIR) tablet 10 mg  10 mg Oral QHS Reubin Milan, MD   10 mg at 07/26/2018 2135  . nitroGLYCERIN (NITROSTAT) SL tablet 0.4 mg  0.4 mg Sublingual Q5 min PRN Reubin Milan, MD      . rosuvastatin (CRESTOR) tablet 10 mg  10 mg Oral q1800 Reubin Milan, MD   10 mg at 07/23/18 1631  . traMADol (ULTRAM) tablet 50 mg  50 mg Oral Daily PRN Reubin Milan, MD      . zolpidem [facility]) tablet 5 mg  5 mg Oral QHS Reubin Milan, MD   5 mg at 07/15/2018 2135     Discharge Medications: Please see discharge summary for a list of discharge medications.  Relevant Imaging Results:  Relevant Lab Results:   Additional Information SSN [id number] Clydene Pugh, LCSW

## 2018-07-24 DIAGNOSIS — N183 Chronic kidney disease, stage 3 (moderate): Secondary | ICD-10-CM

## 2018-07-24 DIAGNOSIS — R7989 Other specified abnormal findings of blood chemistry: Secondary | ICD-10-CM

## 2018-07-24 DIAGNOSIS — I25118 Atherosclerotic heart disease of native coronary artery with other forms of angina pectoris: Secondary | ICD-10-CM

## 2018-07-24 DIAGNOSIS — I4891 Unspecified atrial fibrillation: Secondary | ICD-10-CM

## 2018-07-24 DIAGNOSIS — I5033 Acute on chronic diastolic (congestive) heart failure: Secondary | ICD-10-CM

## 2018-07-24 DIAGNOSIS — D72829 Elevated white blood cell count, unspecified: Secondary | ICD-10-CM

## 2018-07-24 DIAGNOSIS — D638 Anemia in other chronic diseases classified elsewhere: Secondary | ICD-10-CM

## 2018-07-24 DIAGNOSIS — I10 Essential (primary) hypertension: Secondary | ICD-10-CM

## 2018-07-24 DIAGNOSIS — R9431 Abnormal electrocardiogram [ECG] [EKG]: Secondary | ICD-10-CM

## 2018-07-24 LAB — CBC
HCT: 30 % — ABNORMAL LOW (ref 36.0–46.0)
Hemoglobin: 8.8 g/dL — ABNORMAL LOW (ref 12.0–15.0)
MCH: 25 pg — ABNORMAL LOW (ref 26.0–34.0)
MCHC: 29.3 g/dL — ABNORMAL LOW (ref 30.0–36.0)
MCV: 85.2 fL (ref 80.0–100.0)
Platelets: 328 10*3/uL (ref 150–400)
RBC: 3.52 MIL/uL — ABNORMAL LOW (ref 3.87–5.11)
RDW: 17.8 % — ABNORMAL HIGH (ref 11.5–15.5)
WBC: 11.7 10*3/uL — ABNORMAL HIGH (ref 4.0–10.5)
nRBC: 0.2 % (ref 0.0–0.2)

## 2018-07-24 LAB — BASIC METABOLIC PANEL
Anion gap: 11 (ref 5–15)
BUN: 24 mg/dL — ABNORMAL HIGH (ref 8–23)
CO2: 24 mmol/L (ref 22–32)
Calcium: 7.9 mg/dL — ABNORMAL LOW (ref 8.9–10.3)
Chloride: 100 mmol/L (ref 98–111)
Creatinine, Ser: 1.22 mg/dL — ABNORMAL HIGH (ref 0.44–1.00)
GFR calc Af Amer: 48 mL/min — ABNORMAL LOW (ref >60)
GFR calc non Af Amer: 42 mL/min — ABNORMAL LOW (ref >60)
Glucose, Bld: 80 mg/dL (ref 70–99)
Potassium: 3.7 mmol/L (ref 3.5–5.1)
Sodium: 135 mmol/L (ref 135–145)

## 2018-07-24 MED ORDER — DILTIAZEM HCL 100 MG IV SOLR
5.0000 mg/h | INTRAVENOUS | Status: DC
  Administered 2018-07-24 – 2018-07-28 (×6): 5 mg/h via INTRAVENOUS
  Filled 2018-07-24 (×6): qty 100

## 2018-07-24 MED ORDER — METOPROLOL TARTRATE 5 MG/5ML IV SOLN
5.0000 mg | Freq: Once | INTRAVENOUS | Status: AC
  Administered 2018-07-24: 13:00:00 5 mg via INTRAVENOUS
  Filled 2018-07-24: qty 5

## 2018-07-24 MED ORDER — METOPROLOL SUCCINATE ER 25 MG PO TB24
25.0000 mg | ORAL_TABLET | Freq: Every day | ORAL | Status: DC
  Administered 2018-07-25 – 2018-07-27 (×3): 25 mg via ORAL
  Filled 2018-07-24 (×3): qty 1

## 2018-07-24 MED ORDER — MAGNESIUM SULFATE 2 GM/50ML IV SOLN
2.0000 g | Freq: Once | INTRAVENOUS | Status: AC
  Administered 2018-07-24: 08:00:00 2 g via INTRAVENOUS
  Filled 2018-07-24: qty 50

## 2018-07-24 NOTE — Progress Notes (Signed)
Patient transferred into ICU room 10. Dr. Wynetta Emery made aware that patient has been successfully transferred. Patient's daughter, Helene Kelp, called and updated on change in status per patient request. All questions addressed and answered. Awaiting further orders at this time.  Celestia Khat, RN

## 2018-07-24 NOTE — Clinical Social Work Note (Signed)
Patient will discharge to [facility]. She cannot admit this weekend due to facility not accepting admissions on the weekend.     Kenyona Rena, Clydene Pugh, LCSW

## 2018-07-24 NOTE — Progress Notes (Signed)
PROGRESS NOTE [name]  JXB:[id number]  DOB: 1934/09/24  DOA: 07/03/2018 PCP: Terald Sleeper, PA-C   Brief Admission Hx: 84 year old female with coronary artery disease, diastolic congestive heart failure, chronic atrial fibrillation and recent NSTEMI presented with acute on chronic diastolic congestive heart failure exacerbation with shortness of breath and hypoxia and progressive weakness.  MDM/Assessment & Plan:   1. Acute diastolic CHF exacerbation with volume overload- is clinically improving and responding to IV diuresis with Lasix.  We are monitoring renal function closely and it is holding stable at this time.  She remains short of breath and deconditioned.  PT recommending SNF placement. 2. Prolonged QT-persists, she was given an additional dose of IV magnesium, her Celexa has been discontinued, Protonix has been discontinued, she remains on amiodarone.  Repeat EKG in a.m.  Check magnesium and electrolytes in a.m. 3. Elevated troponin- mild elevation secondary to demand ischemia associated with congestive heart failure exacerbation, do not suspect ACS.  Patient has no chest pain symptoms. 4. Leukocytosis-WBC trending down. 5. Hyponatremia-secondary to volume overload-this is improved with diuresis and now sodium has corrected. 6. Stage III CKD-creatinine has improved slightly with diuresis.  Continue to monitor closely. 7. Chronic atrial fibrillation- she is rate controlled on amiodarone and Toprol-XL.  She is fully anticoagulated with apixaban. 8. Essential hypertension-her blood pressures remain controlled however she has had some soft blood pressure readings and we have discontinued the hydralazine and reduced the doses of some of her blood pressure medicines. 9. Hypothyroidism- continue levothyroxine 50 mcg daily.  DVT prophylaxis: Apixaban Code Status: Full Family Communication: Phone conversation with daughter Helene Kelp Disposition Plan: SNF placement when medically stable    Consultants:    Procedures:    Antimicrobials:     Subjective: The patient reports that she continues to feel weak but overall has had some improvement.  She denies chest pain and shortness of breath.  She is not ambulating well.  She requires a lot of assistance with ADLs.  Objective: Vitals:   07/23/18 2130 07/24/18 0500 07/24/18 0615 07/24/18 1156  BP: 120/70  124/66   Pulse: 86  86   Resp: 17  16   Temp: 97.7 F (36.5 C)  97.7 F (36.5 C)   TempSrc: Oral  Oral   SpO2: 93%  96% 91%  Weight:  69.7 kg    Height:        Intake/Output Summary (Last 24 hours) at 07/24/2018 1230 Last data filed at 07/24/2018 0830 Gross per 24 hour  Intake 480 ml  Output 950 ml  Net -470 ml   Filed Weights   07/10/2018 1428 07/23/18 0500 07/24/18 0500  Weight: 67 kg 69.2 kg 69.7 kg    REVIEW OF SYSTEMS  As per history otherwise all reviewed and reported negative  Exam:  General exam: Elderly, chronically ill-appearing female in no apparent distress sitting up in a chair.  She is cooperative and pleasant. Respiratory system: Faint crackles in left base.  No increased work of breathing. Cardiovascular system: Irregularly irregular S1 & S2 heard.  Gastrointestinal system: Abdomen is nondistended, soft and nontender. Normal bowel sounds heard. Central nervous system: Alert and oriented. No focal neurological deficits. Extremities: Trace pretibial edema bilateral lower extremities.  Data Reviewed: Basic Metabolic Panel: Recent Labs  Lab 07/18/18 0443 07/24/2018 1455 07/23/18 0626 07/24/18 0426  NA 135 132* 136 135  K 4.9 4.6 4.4 3.7  CL 101 99 103 100  CO2 24 23 23 24   GLUCOSE 99 115* 100*  80  BUN 15 21 24* 24*  CREATININE 1.15* 1.31* 1.33* 1.22*  CALCIUM 8.0* 8.4* 8.1* 7.9*  MG  --  2.2  --   --    Liver Function Tests: Recent Labs  Lab 07/30/2018 1455  AST 40  ALT 28  ALKPHOS 138*  BILITOT 0.8  PROT 6.9  ALBUMIN 3.0*   No results for input(s): LIPASE, AMYLASE in  the last 168 hours. No results for input(s): AMMONIA in the last 168 hours. CBC: Recent Labs  Lab 07/18/18 0443 07/30/2018 1455 07/24/18 0426  WBC 10.0 14.6* 11.7*  NEUTROABS 7.9* 11.9*  --   HGB 8.6* 10.0* 8.8*  HCT 29.4* 33.2* 30.0*  MCV 83.5 83.8 85.2  PLT 299 404* 328   Cardiac Enzymes: Recent Labs  Lab 07/08/2018 1455 07/30/2018 1738 07/23/18 0038 07/23/18 0626  TROPONINI 0.06* 0.05* 0.06* 0.05*   CBG (last 3)  No results for input(s): GLUCAP in the last 72 hours. Recent Results (from the past 240 hour(s))  Culture, blood (routine x 2)     Status: None   Collection Time: 07/14/18  6:10 PM  Result Value Ref Range Status   Specimen Description BLOOD LEFT ANTECUBITAL  Final   Special Requests   Final    BOTTLES DRAWN AEROBIC AND ANAEROBIC Blood Culture adequate volume   Culture   Final    NO GROWTH 5 DAYS Performed at [facility], [address], [place], [place] [address]    Report Status 07/19/2018 FINAL  Final  Culture, blood (routine x 2)     Status: None   Collection Time: 07/14/18  6:10 PM  Result Value Ref Range Status   Specimen Description BLOOD BLOOD LEFT WRIST  Final   Special Requests   Final    BOTTLES DRAWN AEROBIC AND ANAEROBIC Blood Culture adequate volume   Culture   Final    NO GROWTH 5 DAYS Performed at [facility], [address], [place], [place] [address]    Report Status 07/19/2018 FINAL  Final  Gram stain     Status: None   Collection Time: 07/15/18 10:27 AM  Result Value Ref Range Status   Specimen Description PLEURAL RIGHT  Final   Special Requests NONE  Final   Gram Stain   Final    RARE WBC PRESENT, PREDOMINANTLY MONONUCLEAR NO ORGANISMS SEEN Performed at [facility], [place] [address], [place], [place] [address]    Report Status 07/15/2018 FINAL  Final  Culture, body fluid-bottle     Status: None   Collection Time: 07/15/18 10:27 AM  Result Value Ref Range Status   Specimen Description PLEURAL RIGHT  Final   Special Requests  NONE  Final   Culture   Final    NO GROWTH 5 DAYS Performed at [facility] [address] [place], [place] [address]    Report Status 07/20/2018 FINAL  Final  Culture, blood (routine x 2)     Status: None (Preliminary result)   Collection Time: 07/10/2018  3:15 PM  Result Value Ref Range Status   Specimen Description RIGHT ANTECUBITAL  Final   Special Requests   Final    BOTTLES DRAWN AEROBIC AND ANAEROBIC Blood Culture adequate volume   Culture   Final    NO GROWTH 2 DAYS Performed at [facility], [address] [place], [place] [address]    Report Status PENDING  Incomplete  Culture, blood (routine x 2)     Status: None (Preliminary result)  Collection Time: 07/11/2018  3:15 PM  Result Value Ref Range Status   Specimen Description BLOOD RIGHT WRIST  Final   Special Requests   Final    BOTTLES DRAWN AEROBIC AND ANAEROBIC Blood Culture adequate volume   Culture   Final    NO GROWTH 2 DAYS Performed at [facility], [address] [place], [place] [address]    Report Status PENDING  Incomplete    Studies: Dg Chest Port 1 View  Result Date: 07/08/2018 CLINICAL DATA:  Shortness of breath EXAM: PORTABLE CHEST 1 VIEW COMPARISON:  07/15/2018 FINDINGS: Cardiac shadow is enlarged but stable. Bilateral pleural effusions are noted right greater than left with associated underlying atelectatic changes. Increased vascular congestion is noted as well. IMPRESSION: Increasing changes of CHF with bilateral effusions. Bibasilar atelectatic changes are as well. Electronically Signed   By: Inez Catalina M.D.   On: 07/27/2018 15:11   Scheduled Meds: . amiodarone  200 mg Oral Daily  . apixaban  5 mg Oral BID  . ferrous sulfate  325 mg Oral Q breakfast  . fluticasone  2 spray Each Nare Daily  . furosemide  20 mg Intravenous BID  . isosorbide mononitrate  15 mg Oral q morning - 10a  . levothyroxine  50 mcg Oral Q0600  . loratadine  10 mg Oral QHS  . losartan  50 mg Oral Daily  . metoprolol  succinate  12.5 mg Oral Daily  . montelukast  10 mg Oral QHS  . rosuvastatin  10 mg Oral q1800  . zolpidem  5 mg Oral QHS   Continuous Infusions: . sodium chloride Stopped (07/11/2018 2016)    Principal Problem:   Volume overload Active Problems:   HYPERCHOLESTEROLEMIA   Coronary artery disease   Prolonged QT interval   Anemia due to chronic blood loss   AF (paroxysmal atrial fibrillation) (HCC)   Anxiety and depression   Acute diastolic (congestive) heart failure (HCC)   Essential hypertension   GERD (gastroesophageal reflux disease) s/p Toupet fundoplication (partial posterior wrap)   Hypothyroidism   Elevated troponin   Leukocytosis  Time spent:   Irwin Brakeman, MD [facility] 07/24/2018, 12:30 PM    LOS: 1 day  How to contact the [facility] Attending or Consulting provider [place] or covering provider during after hours [place], for this patient?  1. Check the care team in [facility] and look for a) attending/consulting TR[facility] provider listed and b) the [facility] team listed 2. Log into [email or web address] and use [place]'s universal password to access. If you do not have the password, please contact the hospital operator. 3. Locate the [facility] provider you are looking for under [facility] and page to a number that you can be directly reached. 4. If you still have difficulty reaching the provider, please page the [facility] (Director on Call) for the Hospitalists listed on amion for assistance.

## 2018-07-24 NOTE — Significant Event (Signed)
07/24/2018 12:53 PM  Pt developed Afib RVR.  Will give lopressor 5 mg IV x 1 and will transfer to stepdown unit given her problems with prolonged QTc.  If necessary will start IV diltiazem infusion.    Murvin Natal  MD

## 2018-07-24 NOTE — Care Management Important Message (Signed)
Important Message  Patient Details  Name: [name] MRN: [id number] Date of Birth: 1935/01/12   Medicare Important Message Given:  Yes    [name] 07/24/2018, 2:49 PM

## 2018-07-24 NOTE — Progress Notes (Signed)
Physical Therapy Treatment Patient Details Name: [name] [place] MRN: [id number] DOB: [birth year] Today's Date: 07/24/2018    History of Present Illness Onesty I Ayllon is a 84 y.o. female with medical history significant of chronic blood loss anemia, anxiety, depression, atrophy of the left kidney, melanoma, right colon cancer, stage III chronic kidney disease, GERD, hiatal hernia, [name]'s esophagus, hypertension, hypothyroidism, macular degeneration, pneumonia, pulmonary hypertension, paroxysmal atrial fibrillation, CAD, history of NSTEMI, chronic diastolic CHF who has been admitted 3 times in the past 6 weeks, initially for robotic repair of incarcerated recurrent hiatal hernia that was complicated by postop A. fib with marked "asymptomatic" elevation of troponin to 13.58 and started on amiodarone and Eliquis.  She was readmitted a few days later after discharge due to paroxysmal A. fib with RVR and acute kidney injury.  She was supposed to have TEE cardioversion, but subsequently converted to sinus rhythm.  She was readmitted on 07/14/2018 for a total of 4 days after presenting there with dyspnea and clear or whitish sputum producing cough.  COVID-19 test was negative.  Today she presents again to the emergency department with complaints of still feeling weak and dyspneic.  Her O2 sat was 86% when EMS arrived.  This improved with nasal cannula oxygen.  She denies fever, sore throat, wheezing or hemoptysis.  She denies chest pain, palpitations, dizziness, diaphoresis, but complains of lower extremity edema, PND and orthopnea.  She denies abdominal pain, nausea, emesis, diarrhea, constipation, melena or hematochezia.  No dysuria, frequency or hematuria.    PT Comments    Pt supine and willing to participate with therapy, no reports of pain.  Min A and increased time required with bed mobilty and transfer training.  Pt educated on proper handplacement to assist with standing and elevated bed height to assist with  standing.  Pt with decreased O2 saturation to 87% upon standing, increased to 5L O2 A during transfer to chair.  Pt able to ambulate 5 steps to chair, limited by fatigue and overall weakness.  Reduced to 4L O2 A upon sitting with ability to keep O2 saturation at 94% and higher.  EOS pt left in chair with call bell within reach and RN aware of status.   Follow Up Recommendations  SNF;Supervision/Assistance - 24 hour;Supervision for mobility/OOB     Equipment Recommendations  Rolling walker with 5" wheels    Recommendations for Other Services       Precautions / Restrictions Precautions Precautions: Fall Restrictions Weight Bearing Restrictions: No    Mobility  Bed Mobility Overal bed mobility: Needs Assistance Bed Mobility: Supine to Sit     Supine to sit: Min guard     General bed mobility comments: increased time, labored movements.  Cueing for handrail assistance for increased ease  Transfers Overall transfer level: Needs assistance Equipment used: Rolling walker (2 wheeled);None Transfers: Sit to/from Stand Sit to Stand: Min assist         General transfer comment: Multiple attempts with STS, educated handplacement to assist and safety, increased bed height for increased ease.  Upon standing used RW for safety  Ambulation/Gait Ambulation/Gait assistance: Min Web designer (Feet): 5 Feet   Gait Pattern/deviations: Decreased step length - right;Decreased step length - left;Decreased stride length Gait velocity: decreased   General Gait Details: limited to 4-5 steps at bedside mostly due to c/o lightheadedness and fatigue   Stairs             Wheelchair Mobility    Modified Rankin (Stroke  Patients Only)       Balance                                            Cognition Arousal/Alertness: Awake/alert Behavior During Therapy: WFL for tasks assessed/performed Overall Cognitive Status: Within Functional Limits for tasks  assessed                                        Exercises      General Comments        Pertinent Vitals/Pain Pain Assessment: No/denies pain    Home Living                      Prior Function            PT Goals (current goals can now be found in the care plan section)      Frequency    Min 3X/week      PT Plan Current plan remains appropriate    Co-evaluation              AM-PAC PT "6 Clicks" Mobility   Outcome Measure  Help needed turning from your back to your side while in a flat bed without using bedrails?: None Help needed moving from lying on your back to sitting on the side of a flat bed without using bedrails?: A Little Help needed moving to and from a bed to a chair (including a wheelchair)?: A Little Help needed standing up from a chair using your arms (e.g., wheelchair or bedside chair)?: A Little Help needed to walk in hospital room?: A Lot Help needed climbing 3-5 steps with a railing? : A Lot 6 Click Score: 17    End of Session Equipment Utilized During Treatment: Gait belt;Oxygen Activity Tolerance: Patient tolerated treatment well;Patient limited by fatigue Patient left: in chair;with call bell/phone within reach Nurse Communication: Mobility status PT Visit Diagnosis: Unsteadiness on feet (R26.81);Other abnormalities of gait and mobility (R26.89);Muscle weakness (generalized) (M62.81)     Time: 6168-3729 PT Time Calculation (min) (ACUTE ONLY): 30 min  Charges:  $Therapeutic Activity: 23-37 mins                     [address] LPTA; CBIS [phone]  Jerzey, Komperda 07/24/2018, 11:59 AM

## 2018-07-24 NOTE — Progress Notes (Signed)
Report called to Barbra Sarks, RN, patient to transfer to stepdown, bed 10

## 2018-07-24 NOTE — Progress Notes (Signed)
Dr Olevia Bowens notified of prolonged QTC from AM EKG. Awaiting new orders

## 2018-07-24 NOTE — Consult Note (Signed)
Cardiology Consultation:   Patient ID: Gricelda Foland Craker MRN: [id number]; DOB: Dec 30, 1934  Admit date: 07/20/2018 Date of Consult: 07/24/2018  Primary Care Provider: Terald Sleeper, PA-C Primary Cardiologist: Kate Sable, MD  Primary Electrophysiologist:  None    Patient Profile:   [name] is a 84 y.o. female with a hx of chronic diastolic heart failure, coronary artery disease, and persistent atrial fibrillation who is being seen today for the evaluation of rapid atrial fibrillation at the request of Dr. Wynetta Emery.  History of Present Illness:   [name] is an 84 year old woman with multiple recent hospitalizations.  Most recently, she was hospitalized at [facility] in March.  She has colon cancer status post colostomy and was admitted on 06/05/2018 with an incarcerated hernia requiring extensive surgery (laparoscopic fundoplication).  She developed rapid atrial fibrillation and atrial tachycardia.  She was started on amiodarone.  She is anticoagulated with Eliquis.  She had a troponin elevation with enzymatic evidence of non-STEMI earlier in March but she was entirely asymptomatic.  The decision was made at that time not to pursue invasive cardiac catheterization and it was felt her troponin elevation was likely demand ischemia related to rapid atrial fibrillation.  Echocardiogram on 06/07/2018 showed normal left ventricular systolic function, LVEF 60 to 65%, with normal regional wall motion.  She then presented to [facility] ED on 07/08/2018 complaining of generalized weakness and shortness of breath.  BNP elevated at 2781.  Chest x-Shaqueena was consistent with CHF with bilateral pleural effusions.  She was started on IV Lasix and has been diuresing well.  Due to prolonged QT, she was given IV magnesium and both Celexa and Protonix were discontinued.  She has had a mild troponin elevation deemed secondary to demand ischemia associated with CHF exacerbation.  She had some hyponatremia  which has been improving and was secondary to volume overload.  Initially heart rate was controlled on amiodarone and Toprol-XL.  She then developed rapid atrial fibrillation earlier today and was given IV Lopressor.  She denies chest pain.  She said her breathing is better than what it was when she first came to the ED.  Past Medical History:  Diagnosis Date  . Anemia due to blood loss, chronic   . Anxiety state, unspecified   . Atrophy of left kidney   . Cancer ([place])    melanoma on back  . Cancer of right colon ([place]) 05/20/2017  . CKD (chronic kidney disease), stage III (HCC)      sees Dr. Lowanda Foster   . Colon cancer ([place]) 2019   stage 3  . Colostomy in place [facility])    in 2016  . Coronary artery disease cardiologist-  dr Bronson Ing   a. Moderate to severe coronary disease involving the left anterior descending artery and right coronary artery.  Sequential stenosis in the LAD is significant.  Right coronary artery is moderate to severely likely nonischemic. Questionable small LVOT obstruction.  No Brockenbrough maneuver was performed.  Catheterization January 2013 w/ PTCA and DES x1 to pLAD b. 05/2014 MV no isch/infarct, EF 60%.  . Depressive disorder, not elsewhere classified   . Diastolic CHF, chronic [facility])    cardiologist-  dr Bronson Ing   pt. denies at preop. 06/07/18 - EF 60-65% on echo, no obvious dysfunction appreciated  . Diversion colitis 2016  . Epidural abscess L5- S1 03/01/2016  . Gastro-esophageal reflux disease without esophagitis 07/16/2007   Overview:  Overview:  Qualifier: Diagnosis of  By: Harlon Ditty CMA (  AAMA), Dottie  Overview:  Overview:  Overview:  Qualifier: Diagnosis of  By: Nelson-Smith CMA (AAMA), Dottie   . GERD (gastroesophageal reflux disease)   . H/O hiatal hernia   . History of [name]'s esophagus   . History of melanoma excision    back  . Hypertension   . Hypothyroidism   . Infection of spine ([place])    in hospial  . Macular degeneration    both eyes   . PAF (paroxysmal atrial fibrillation) ([place])   . Parastomal hernia   . Pneumonia   . PONV (postoperative nausea and vomiting)   . Pulmonary hypertension ([place])    PA systolic [id number] mmHg by echocardiogram 06-17-2015  PA pressure 05-08-2011 by cardiac catheterization -- no sig. pulm. htn)PA saturation 62% thermodilution cardiac index 2.0 thick cardiac index 2.4  . Pure hypercholesterolemia   . S/P drug eluting coronary stent placement 05/08/2011   x1 DES to proximal LAD  . Symptomatic cholelithiasis 07/18/2016  . Tubulovillous adenoma of colon   . Wears dentures    lower  . Wears glasses     Past Surgical History:  Procedure Laterality Date  . CARDIAC CATHETERIZATION Left 03/05/2015   Procedure: CENTRAL LINE INSERTION;  Surgeon: Aviva Signs, MD;  Location: [facility];  Service: General;  Laterality: Left;  . CATARACT EXTRACTION W/ INTRAOCULAR LENS  IMPLANT, BILATERAL  ~ 2002  . CHOLECYSTECTOMY N/A 07/18/2016   Procedure: LAPAROSCOPIC CHOLECYSTECTOMY WITH INTRAOPERATIVE CHOLANGIOGRAM;  Surgeon: [name], MD;  Location: [facility];  Service: General;  Laterality: N/A;  . COLECTOMY WITH COLOSTOMY CREATION/HARTMANN PROCEDURE N/A 03/05/2015   Procedure: PARTIAL COLECTOMY WITH COLOSTOMY CREATION/HARTMANN PROCEDURE;  Surgeon: Aviva Signs, MD;  Location: [facility];  Service: General;  Laterality: N/A;  . COLON SURGERY     colostomy bag  LLQ  . ESOPHAGEAL MANOMETRY N/A 06/06/2014   Procedure: ESOPHAGEAL MANOMETRY (EM);  Surgeon: Jerene Bears, MD;  Location: [facility];  Service: Gastroenterology;  Laterality: N/A;  . ESOPHAGOGASTRODUODENOSCOPY (EGD) WITH PROPOFOL N/A 08/04/2015   Procedure: ESOPHAGOGASTRODUODENOSCOPY (EGD) WITH PROPOFOL;  Surgeon: Mauri Pole, MD;  Location: [facility];  Service: Endoscopy;  Laterality: N/A;  . HERNIA REPAIR  2019   left side/ removed mass  . IR GENERIC HISTORICAL  03/04/2016   IR LUMBAR DISC ASPIRATION W/IMG GUIDE 03/04/2016 Luanne Bras, MD  [facility]  . IR THORACENTESIS ASP PLEURAL SPACE W/IMG GUIDE  07/15/2018  . LAPAROSCOPIC NISSEN FUNDOPLICATION N/A 1/60/1093   Procedure: LAPAROSCOPIC  LYSIS OF ADHESIONS, SEGMENTAL GASTRECTOMY, PARTIAL REDUCTION OF HERNIA;  Surgeon: [name], MD;  Location: [facility];  Service: General;  Laterality: N/A;  . LAPAROSCOPIC PARTIAL COLECTOMY N/A 05/07/2017   Procedure: LAPAROSCOPIC  CONVERTED TO OPEN RIGHT COLECTOMY, PARASTOMAL HERNIA  REPAIR;  Surgeon: Leighton Ruff, MD;  Location: [facility];  Service: General;  Laterality: N/A;  . NISSEN FUNDOPLICATION  2355  . PERCUTANEOUS CORONARY STENT INTERVENTION (PCI-S) N/A 05/08/2011   Procedure: PERCUTANEOUS CORONARY STENT INTERVENTION (PCI-S);  Surgeon: Wellington Hampshire, MD;  Location: [facility] CATH LAB;  Service: Cardiovascular;  Laterality: N/A;  . ROTATOR CUFF REPAIR  ~ 2001   right  . SHOULDER ARTHROSCOPY  ~ 2004; 2005   left; "joint's wore out"  . TONSILLECTOMY  ~ 1944  . TUBAL LIGATION  1972     Home Medications:  Prior to Admission medications   Medication Sig Start Date End Date Taking? Authorizing Provider  acetaminophen (TYLENOL) 325 MG tablet Take 2 tablets (650 mg total) by mouth every 6 (  six) hours as needed for mild pain (or Fever >/= 101). 07/18/18  Yes Swayze, Ava, DO  albuterol (PROVENTIL HFA;VENTOLIN HFA) 108 (90 Base) MCG/ACT inhaler Inhale 2 puffs into the lungs every 6 (six) hours as needed for wheezing or shortness of breath. 04/15/18  Yes Terald Sleeper, PA-C  ALPRAZolam (XANAX) 0.25 MG tablet TAKEK 1/2 TO 1 TABLET 3 TIMES DAILY AS NEEDED Patient taking differently: Take 0.125-0.25 mg by mouth 3 (three) times daily as needed for anxiety.  01/30/18  Yes Terald Sleeper, PA-C  amiodarone (PACERONE) 200 MG tablet Take 1 tablet (200 mg total) by mouth daily. 07/19/18  Yes Swayze, Ava, DO  apixaban (ELIQUIS) 5 MG TABS tablet Take 1 tablet (5 mg total) by mouth 2 (two) times daily. 06/11/18  Yes Michael Boston, MD  citalopram (CELEXA) 40 MG  tablet TAKE ONE (1) TABLET EACH DAY Patient taking differently: Take 40 mg by mouth daily.  01/30/18  Yes Terald Sleeper, PA-C  denosumab (PROLIA) 60 MG/ML SOSY injection Inject 60 mg into the skin every 6 (six) months. 02/18/18  Yes Terald Sleeper, PA-C  esomeprazole (NEXIUM) 40 MG capsule TAKE ONE (1) [place] Patient taking differently: Take 40 mg by mouth daily.  01/30/18  Yes Terald Sleeper, PA-C  ferrous sulfate 325 (65 FE) MG EC tablet Take 325 mg by mouth daily with breakfast.    Yes [provider]  fluticasone (FLONASE) 50 MCG/ACT nasal spray USE 2 SPRAYS IN EACH NOSTRIL DAILY Patient taking differently: Place 2 sprays into both nostrils daily.  10/15/17  Yes Terald Sleeper, PA-C  hydrALAZINE (APRESOLINE) 50 MG tablet TAKE ONE (1) TABLET THREE (3) [place] Patient taking differently: Take 50 mg by mouth 3 (three) times daily.  01/30/18  Yes Terald Sleeper, PA-C  isosorbide mononitrate (IMDUR) 30 MG 24 hr tablet Take 1 tablet (30 mg total) by mouth every morning. 01/30/18  Yes Terald Sleeper, PA-C  levothyroxine (SYNTHROID, LEVOTHROID) 50 MCG tablet TAKE ONE TABLET EACH MORNING BEFORE BREAKFAST Patient taking differently: Take 50 mcg by mouth daily before breakfast.  01/30/18  Yes Terald Sleeper, PA-C  loratadine (CLARITIN) 10 MG tablet Take 1 tablet (10 mg total) by mouth at bedtime. 01/30/18  Yes Terald Sleeper, PA-C  losartan (COZAAR) 100 MG tablet TAKE ONE (1) TABLET EACH DAY Patient taking differently: Take 100 mg by mouth daily.  01/30/18  Yes Terald Sleeper, PA-C  Meclizine HCl 25 MG CHEW TAKE 1 TABLET 3 TIMES DAILY AS NEEDED FOR DIZZINESS Patient taking differently: Chew 25 mg by mouth 3 (three) times daily as needed (dizziness).  10/15/17  Yes Terald Sleeper, PA-C  metoprolol succinate (TOPROL-XL) 25 MG 24 hr tablet Take 0.5 tablets (12.5 mg total) by mouth daily. 07/19/18  Yes Swayze, Ava, DO  montelukast (SINGULAIR) 10 MG tablet Take 1 tablet (10 mg total) by mouth  at bedtime. 01/30/18  Yes Terald Sleeper, PA-C  Multiple Vitamins-Minerals (ICAPS AREDS 2 PO) Take 1 capsule by mouth 2 (two) times a day.   Yes [provider]  nitroGLYCERIN (NITROSTAT) 0.4 MG SL tablet Place 1 tablet (0.4 mg total) under the tongue every 5 (five) minutes as needed for chest pain. Reported on 04/26/2015 01/13/17  Yes Herminio Commons, MD  nystatin (MYCOSTATIN/NYSTOP) powder Apply topically 4 (four) times daily. Patient taking differently: Apply 1 Bottle topically as needed (yeast).  05/05/18  Yes Terald Sleeper, PA-C  ondansetron (ZOFRAN-ODT) 4  MG disintegrating tablet Take 1 tablet (4 mg total) by mouth every 6 (six) hours as needed for nausea or vomiting. 06/11/18  Yes Gross, Remo Lipps, MD  polyethylene glycol powder (GAVILAX) powder MIX 17 GRAMS IN 80Z OF WATER/JUICE AND DRINK TWICE DAILY Patient taking differently: Take 17 g by mouth daily.  10/15/17  Yes Terald Sleeper, PA-C  Propylene Glycol (SYSTANE BALANCE OP) Apply 1 drop to eye daily as needed (dry eyes).   Yes [provider]  rosuvastatin (CRESTOR) 10 MG tablet TAKE ONE (1) TABLET EACH DAY Patient taking differently: Take 10 mg by mouth daily.  01/30/18  Yes Terald Sleeper, PA-C  traMADol (ULTRAM) 50 MG tablet Take 1 tablet (50 mg total) by mouth every 12 (twelve) hours as needed. Patient taking differently: Take 50 mg by mouth daily as needed for moderate pain.  06/12/17  Yes Alla Feeling, NP  Vitamin D, Ergocalciferol, (DRISDOL) 50000 units CAPS capsule TAKE 1 CAPSULE ON TUESDAYS Patient taking differently: Take 50,000 Units by mouth every Tuesday.  09/11/17  Yes Terald Sleeper, PA-C  zolpidem (AMBIEN) 10 MG tablet Take 10 mg by mouth at bedtime.   Yes [provider]    Inpatient Medications: Scheduled Meds: . amiodarone  200 mg Oral Daily  . apixaban  5 mg Oral BID  . ferrous sulfate  325 mg Oral Q breakfast  . fluticasone  2 spray Each Nare Daily  . furosemide  20 mg Intravenous BID   . isosorbide mononitrate  15 mg Oral q morning - 10a  . levothyroxine  50 mcg Oral Q0600  . loratadine  10 mg Oral QHS  . losartan  50 mg Oral Daily  . metoprolol succinate  12.5 mg Oral Daily  . montelukast  10 mg Oral QHS  . rosuvastatin  10 mg Oral q1800  . zolpidem  5 mg Oral QHS   Continuous Infusions: . sodium chloride Stopped (07/29/2018 2016)   PRN Meds: sodium chloride, acetaminophen **OR** acetaminophen, albuterol, ALPRAZolam, meclizine, nitroGLYCERIN, traMADol  Allergies:    Allergies  Allergen Reactions  . Sulfamethoxazole Rash    Social History:   Social History   Socioeconomic History  . Marital status: Divorced    Spouse name: Not on file  . Number of children: 3  . Years of education: Not on file  . Highest education level: Not on file  Occupational History  . Occupation: Retired  Scientific laboratory technician  . Financial resource strain: Not hard at all  . Food insecurity:    Worry: Never true    Inability: Never true  . Transportation needs:    Medical: No    Non-medical: No  Tobacco Use  . Smoking status: Former Smoker    Packs/day: 1.00    Years: 20.00    Pack years: 20.00    Types: Cigarettes    Start date: 10/12/1958    Last attempt to quit: 04/26/1988    Years since quitting: 30.2  . Smokeless tobacco: Never Used  Substance and Sexual Activity  . Alcohol use: No    Alcohol/week: 0.0 standard drinks  . Drug use: No  . Sexual activity: Not Currently  Lifestyle  . Physical activity:    Days per week: 2 days    Minutes per session: 20 min  . Stress: Only a little  Relationships  . Social connections:    Talks on phone: More than three times a week    Gets together: Three times a week  Attends religious service: More than 4 times per year    Active member of club or organization: No    Attends meetings of clubs or organizations: Never    Relationship status: Divorced  . Intimate partner violence:    Fear of current or ex partner: No     Emotionally abused: No    Physically abused: No    Forced sexual activity: No  Other Topics Concern  . Not on file  Social History Narrative   Divorced.  Lives alone.  Ambulates with a walker.     Family History:    Family History  Problem Relation Age of Onset  . Heart attack Father   . Hypertension Father   . Hypertension Mother   . Skin cancer Brother        melanoma  . Atrial fibrillation Sister   . Skin cancer Sister   . Colon cancer Neg Hx   . Esophageal cancer Neg Hx   . Rectal cancer Neg Hx   . Stomach cancer Neg Hx      ROS:  Please see the history of present illness.    All other ROS reviewed and negative.     Physical Exam/Data:   Vitals:   07/24/18 0615 07/24/18 1156 07/24/18 1251 07/24/18 1415  BP: 124/66  (!) 109/59 90/69  Pulse: 86  (!) 122 (!) 29  Resp: 16  18 (!) 25  Temp: 97.7 F (36.5 C)  97.9 F (36.6 C)   TempSrc: Oral  Oral   SpO2: 96% 91% 93%   Weight:      Height:        Intake/Output Summary (Last 24 hours) at 07/24/2018 1510 Last data filed at 07/24/2018 0830 Gross per 24 hour  Intake 480 ml  Output 950 ml  Net -470 ml   Last 3 Weights 07/24/2018 07/23/2018 07/25/2018  Weight (lbs) 153 lb 10.6 oz 152 lb 8.9 oz 147 lb 11.3 oz  Weight (kg) 69.7 kg 69.2 kg 67 kg     Body mass index is 28.1 kg/m.  General: Elderly, frail, no acute distress HEENT: normal Lymph: no adenopathy Neck: no JVD Endocrine:  No thryomegaly Cardiac: Tachycardic, irregular, normal S1/S2, no S3, no murmurs Lungs: Bibasilar Rales Abd: soft, nontender, no hepatomegaly  Ext: no edema Musculoskeletal:  No deformities, BUE and BLE strength normal and equal Skin: warm and dry  Neuro:  CNs 2-12 intact, no focal abnormalities noted Psych:  Normal affect   EKG:  The EKG was personally reviewed and demonstrates: ECG on 07/24/2018 at 639 showed sinus rhythm with late R wave transition, prolonged QT interval of 551 ms, and diffuse T wave abnormality  Telemetry:   Telemetry was personally reviewed and demonstrates: Rapid atrial fibrillation/atrial tachycardia  Relevant CV Studies: 2D Echo 06/07/18 IMPRESSIONS   1. The left ventricle has normal systolic function with an ejection fraction of 60-65%. The cavity size was normal. Left ventricular diastolic Doppler parameters are indeterminate in the setting of atrial fibrillation. No obvious wall motion  abnormalities. 2. The right ventricle has normal systolic function. The cavity was normal. There is no increase in right ventricular wall thickness. Right ventricular systolic pressure upper normal with an estimated pressure of 34.6 mmHg. 3. Left atrial size was moderately dilated. 4. The aortic valve is tricuspid. Mild calcification of the aortic valve. Moderate aortic annular calcification noted. 5. The mitral valve is normal in structure. Mitral valve regurgitation is mild to moderate by color flow Doppler. 6. The tricuspid  valve is normal in structure. 7. The aortic root is normal in size and structure.  Laboratory Data:  Chemistry Recent Labs  Lab 07/08/2018 1455 07/23/18 0626 07/24/18 0426  NA 132* 136 135  K 4.6 4.4 3.7  CL 99 103 100  CO2 23 23 24   GLUCOSE 115* 100* 80  BUN 21 24* 24*  CREATININE 1.31* 1.33* 1.22*  CALCIUM 8.4* 8.1* 7.9*  GFRNONAA 38* 37* 42*  GFRAA 44* 43* 48*  ANIONGAP 10 10 11     Recent Labs  Lab 07/03/2018 1455  PROT 6.9  ALBUMIN 3.0*  AST 40  ALT 28  ALKPHOS 138*  BILITOT 0.8   Hematology Recent Labs  Lab 07/18/18 0443 07/15/2018 1455 07/24/18 0426  WBC 10.0 14.6* 11.7*  RBC 3.52* 3.96 3.52*  HGB 8.6* 10.0* 8.8*  HCT 29.4* 33.2* 30.0*  MCV 83.5 83.8 85.2  MCH 24.4* 25.3* 25.0*  MCHC 29.3* 30.1 29.3*  RDW 17.4* 17.8* 17.8*  PLT 299 404* 328   Cardiac Enzymes Recent Labs  Lab 07/27/2018 1455 07/19/2018 1738 07/23/18 0038 07/23/18 0626  TROPONINI 0.06* 0.05* 0.06* 0.05*   No results for input(s): TROPIPOC in the last 168 hours.  BNP  Recent Labs  Lab 07/01/2018 1455  BNP 2,781.0*    DDimer No results for input(s): DDIMER in the last 168 hours.  Radiology/Studies:  Dg Chest Port 1 View  Result Date: 07/27/2018 CLINICAL DATA:  Shortness of breath EXAM: PORTABLE CHEST 1 VIEW COMPARISON:  07/15/2018 FINDINGS: Cardiac shadow is enlarged but stable. Bilateral pleural effusions are noted right greater than left with associated underlying atelectatic changes. Increased vascular congestion is noted as well. IMPRESSION: Increasing changes of CHF with bilateral effusions. Bibasilar atelectatic changes are as well. Electronically Signed   By: Inez Catalina M.D.   On: 07/06/2018 15:11    Assessment and Plan:   1.  Persistent/rapid atrial fibrillation: Currently on amiodarone 200 mg daily and Toprol-XL 12.5 mg daily.  Outpatient hydralazine has been discontinued and losartan has been reduced to 50 mg daily.  Imdur also reduced to 15 mg daily from 30 mg daily.  I will stop losartan and Imdur altogether so that AV nodal blocking agents can be advanced in order to control rapid atrial fibrillation.  I will increase Toprol-XL to 25 mg daily.  I will start an IV diltiazem infusion and perhaps she may only need this briefly to get her back into sinus rhythm.  At that point, Toprol-XL can be increased as needed. Of note, she does have a history of symptomatic bradycardia so she will need careful monitoring.  2.  Acute on chronic diastolic heart failure: Currently on IV Lasix 20 mg twice daily.  I/so recordings would indicate that she has had at least 865 cc of output in the last 48 hours.  3.  Prolonged QTc: She has been given IV magnesium and both Protonix and Celexa were discontinued.  4.  Troponin elevation: This is likely secondary to decompensated diastolic heart failure.  This peaked at 0.06 on 07/23/2018.  5.  Chronic kidney disease stage III: Creatinine 1.312 days ago and is 1.22 today.  6.  Leukocytosis: White blood cells were 14.62  days ago and are 11.7 today.  She is SARS coronavirus 2 negative.  7.  Anemia of chronic disease: Hemoglobin 8.8 today.  8.  Hypertension: Blood pressures have been low to low normal.  Hydralazine has been discontinued and losartan was reduced to 50 mg daily and Imdur was reduced to  15 mg daily by hospitalist service.  I will stop losartan and Imdur altogether so that AV nodal blocking agents can be advanced in order to control rapid atrial fibrillation.  9.  Coronary artery disease: She has an LAD stent.  She underwent a normal nuclear stress test in February 2016.  Troponin elevation indicative of decompensated diastolic heart failure/demand ischemia which peaked at 0.06 on 07/23/2018.  Left ventricular systolic function is normal by most recent echocardiogram in March 2020.  She is on beta-blocker and statin therapy.  No aspirin as she is on Eliquis.  For questions or updates, please contact [place] Please consult [email or web address] for contact info under     Signed, Kate Sable, MD  07/24/2018 3:10 PM

## 2018-07-24 NOTE — Progress Notes (Signed)
Verbal order received for cardiology consult. Call placed to [name] as Dr. Wynetta Emery requests cardiology to see her prior to them leaving for the day.

## 2018-07-25 ENCOUNTER — Inpatient Hospital Stay (HOSPITAL_COMMUNITY): Payer: Medicare Other

## 2018-07-25 LAB — MAGNESIUM: Magnesium: 2.1 mg/dL (ref 1.7–2.4)

## 2018-07-25 LAB — BASIC METABOLIC PANEL
Anion gap: 11 (ref 5–15)
BUN: 24 mg/dL — ABNORMAL HIGH (ref 8–23)
CO2: 26 mmol/L (ref 22–32)
Calcium: 7.8 mg/dL — ABNORMAL LOW (ref 8.9–10.3)
Chloride: 95 mmol/L — ABNORMAL LOW (ref 98–111)
Creatinine, Ser: 1.18 mg/dL — ABNORMAL HIGH (ref 0.44–1.00)
GFR calc Af Amer: 50 mL/min — ABNORMAL LOW (ref >60)
GFR calc non Af Amer: 43 mL/min — ABNORMAL LOW (ref >60)
Glucose, Bld: 102 mg/dL — ABNORMAL HIGH (ref 70–99)
Potassium: 3.1 mmol/L — ABNORMAL LOW (ref 3.5–5.1)
Sodium: 132 mmol/L — ABNORMAL LOW (ref 135–145)

## 2018-07-25 LAB — MRSA PCR SCREENING: MRSA by PCR: NEGATIVE

## 2018-07-25 MED ORDER — ALUM & MAG HYDROXIDE-SIMETH 200-200-20 MG/5ML PO SUSP
30.0000 mL | Freq: Once | ORAL | Status: AC
  Administered 2018-07-25: 30 mL via ORAL
  Filled 2018-07-25: qty 30

## 2018-07-25 MED ORDER — POTASSIUM CHLORIDE CRYS ER 20 MEQ PO TBCR
40.0000 meq | EXTENDED_RELEASE_TABLET | Freq: Once | ORAL | Status: AC
  Administered 2018-07-25: 12:00:00 40 meq via ORAL
  Filled 2018-07-25: qty 2

## 2018-07-25 MED ORDER — LEVALBUTEROL HCL 0.63 MG/3ML IN NEBU
0.6300 mg | INHALATION_SOLUTION | Freq: Four times a day (QID) | RESPIRATORY_TRACT | Status: DC | PRN
  Administered 2018-07-26 – 2018-07-27 (×3): 0.63 mg via RESPIRATORY_TRACT
  Filled 2018-07-25 (×4): qty 3

## 2018-07-25 MED ORDER — NITROGLYCERIN 2 % TD OINT
0.5000 [in_us] | TOPICAL_OINTMENT | Freq: Four times a day (QID) | TRANSDERMAL | Status: AC
  Administered 2018-07-25: 0.5 [in_us] via TOPICAL
  Filled 2018-07-25: qty 1

## 2018-07-25 MED ORDER — CHLORHEXIDINE GLUCONATE CLOTH 2 % EX PADS
6.0000 | MEDICATED_PAD | Freq: Every day | CUTANEOUS | Status: DC
  Administered 2018-07-25 – 2018-07-27 (×3): 6 via TOPICAL

## 2018-07-25 MED ORDER — FUROSEMIDE 10 MG/ML IJ SOLN
20.0000 mg | Freq: Once | INTRAMUSCULAR | Status: AC
  Administered 2018-07-25: 22:00:00 20 mg via INTRAVENOUS
  Filled 2018-07-25: qty 2

## 2018-07-25 MED ORDER — POTASSIUM CHLORIDE 10 MEQ/100ML IV SOLN
10.0000 meq | INTRAVENOUS | Status: AC
  Administered 2018-07-25 (×6): 10 meq via INTRAVENOUS
  Filled 2018-07-25 (×6): qty 100

## 2018-07-25 MED ORDER — LEVALBUTEROL HCL 0.63 MG/3ML IN NEBU
INHALATION_SOLUTION | RESPIRATORY_TRACT | Status: AC
  Administered 2018-07-25: 0.63 mg
  Filled 2018-07-25: qty 3

## 2018-07-25 MED ORDER — ORAL CARE MOUTH RINSE
15.0000 mL | Freq: Two times a day (BID) | OROMUCOSAL | Status: DC
  Administered 2018-07-25 – 2018-07-27 (×7): 15 mL via OROMUCOSAL

## 2018-07-25 MED ORDER — POTASSIUM CHLORIDE CRYS ER 20 MEQ PO TBCR
20.0000 meq | EXTENDED_RELEASE_TABLET | Freq: Two times a day (BID) | ORAL | Status: DC
  Administered 2018-07-25 – 2018-07-26 (×2): 20 meq via ORAL
  Filled 2018-07-25 (×2): qty 1

## 2018-07-25 MED ORDER — BUDESONIDE 0.25 MG/2ML IN SUSP
0.2500 mg | Freq: Two times a day (BID) | RESPIRATORY_TRACT | Status: DC
  Administered 2018-07-25 – 2018-07-27 (×5): 0.25 mg via RESPIRATORY_TRACT
  Filled 2018-07-25 (×6): qty 2

## 2018-07-25 NOTE — Progress Notes (Signed)
Patients oxygen saturation drops very quickly, with removal. Spoke to Dr Olevia Bowens about slowly worsening CHF. Oxygen now on HFNC 10 liters. Patient given xopenex instead of albuterol by nebulizer because of heart issues. X-[place] obtained.

## 2018-07-25 NOTE — Progress Notes (Signed)
PROGRESS NOTE [name]  LDJ:[id number]  DOB: 1934-10-10  DOA: 07/30/2018 PCP: Terald Sleeper, PA-C   Brief Admission Hx: 84 year old female with coronary artery disease, diastolic congestive heart failure, chronic atrial fibrillation and recent NSTEMI presented with acute on chronic diastolic congestive heart failure exacerbation with shortness of breath and hypoxia and progressive weakness.  MDM/Assessment & Plan:   Acute diastolic CHF exacerbation with volume overload- is clinically improving and responding to IV diuresis with Lasix.  We are monitoring renal function closely and it is holding stable at this time.  She remains short of breath and deconditioned.  PT recommending SNF placement. 07/25/2018: Patient is still requiring 7 L of supplemental oxygen.  We will continue IV diuretics.  Will add nebs Pulmicort.  Will repeat chest x-[place] in the morning.  Prolonged QT-persists, she was given an additional dose of IV magnesium, her Celexa has been discontinued, Protonix has been discontinued, she remains on amiodarone.  Repeat EKG in a.m.  Check magnesium and electrolytes in a.m. 07/25/2018: EKG done today revealed QTc interval of 560 ms.  Potassium is 3.1.  Will optimize potassium.  Repeat EKG in the morning.  Elevated troponin- mild elevation secondary to demand ischemia associated with congestive heart failure exacerbation, do not suspect ACS.  Patient has no chest pain symptoms.  Leukocytosis-WBC trending down.  Hyponatremia-secondary to volume overload-this is improved with diuresis and now sodium has corrected. 07/25/2018: Likely related to volume.  May also have prognostic significance.  Stage III CKD-creatinine has improved slightly with diuresis.  Continue to monitor closely. 07/25/2018: Serum creatinine is slowly improving with diuresis.  There may be component of cardiorenal syndrome.  Continue to monitor closely.  Chronic atrial fibrillation- she is rate controlled on  amiodarone and Toprol-XL.  She is fully anticoagulated with apixaban.  Essential hypertension-her blood pressures remain controlled however she has had some soft blood pressure readings and we have discontinued the hydralazine and reduced the doses of some of her blood pressure medicines. 07/25/2018: Low normal blood pressure.  Monitor closely.  Hypothyroidism- continue levothyroxine 50 mcg daily.  DVT prophylaxis: Apixaban Code Status: Full Family Communication:  Disposition Plan: SNF placement when medically stable   Consultants:    Procedures:    Antimicrobials:     Subjective: Patient seen alongside patient's nurse. No new complaints. Still requiring 7 liters of supplemental oxygen.  Objective: Vitals:   07/25/18 0430 07/25/18 0800 07/25/18 0802 07/25/18 0900  BP: (!) 95/56  110/73 108/66  Pulse: 98 98 (!) 109 86  Resp: (!) 23 (!) 24 (!) 23 (!) 24  Temp:   (!) 97.4 F (36.3 C)   TempSrc:   Oral   SpO2: 91%  91%   Weight:      Height:        Intake/Output Summary (Last 24 hours) at 07/25/2018 1103 Last data filed at 07/25/2018 0427 Gross per 24 hour  Intake 300.19 ml  Output 450 ml  Net -149.81 ml   Filed Weights   07/23/18 0500 07/24/18 0500 07/25/18 0401  Weight: 69.2 kg 69.7 kg 68 kg    REVIEW OF SYSTEMS  As per history otherwise all reviewed and reported negative  Exam:  General exam: Thin lady.  Not in any distress.   Respiratory system: Decreased air entry.  Mild expiratory wheeze Cardiovascular system: S1-S2.  Gastrointestinal system: Abdomen is nondistended, soft and nontender. Normal bowel sounds heard. Central nervous system: Alert and oriented. No focal neurological deficits.  Data Reviewed: Basic Metabolic Panel:  Recent Labs  Lab 07/19/2018 1455 07/23/18 0626 07/24/18 0426 07/25/18 0358  NA 132* 136 135 132*  K 4.6 4.4 3.7 3.1*  CL 99 103 100 95*  CO2 23 23 24 26   GLUCOSE 115* 100* 80 102*  BUN 21 24* 24* 24*  CREATININE  1.31* 1.33* 1.22* 1.18*  CALCIUM 8.4* 8.1* 7.9* 7.8*  MG 2.2  --   --   --    Liver Function Tests: Recent Labs  Lab 07/21/2018 1455  AST 40  ALT 28  ALKPHOS 138*  BILITOT 0.8  PROT 6.9  ALBUMIN 3.0*   No results for input(s): LIPASE, AMYLASE in the last 168 hours. No results for input(s): AMMONIA in the last 168 hours. CBC: Recent Labs  Lab 07/21/2018 1455 07/24/18 0426  WBC 14.6* 11.7*  NEUTROABS 11.9*  --   HGB 10.0* 8.8*  HCT 33.2* 30.0*  MCV 83.8 85.2  PLT 404* 328   Cardiac Enzymes: Recent Labs  Lab 07/26/2018 1455 07/06/2018 1738 07/23/18 0038 07/23/18 0626  TROPONINI 0.06* 0.05* 0.06* 0.05*   CBG (last 3)  No results for input(s): GLUCAP in the last 72 hours. Recent Results (from the past 240 hour(s))  Culture, blood (routine x 2)     Status: None (Preliminary result)   Collection Time: 07/23/2018  3:15 PM  Result Value Ref Range Status   Specimen Description RIGHT ANTECUBITAL  Final   Special Requests   Final    BOTTLES DRAWN AEROBIC AND ANAEROBIC Blood Culture adequate volume   Culture   Final    NO GROWTH 3 DAYS Performed at [facility], [address], [place], [place] [address]    Report Status PENDING  Incomplete  Culture, blood (routine x 2)     Status: None (Preliminary result)   Collection Time: 07/30/2018  3:15 PM  Result Value Ref Range Status   Specimen Description BLOOD RIGHT WRIST  Final   Special Requests   Final    BOTTLES DRAWN AEROBIC AND ANAEROBIC Blood Culture adequate volume   Culture   Final    NO GROWTH 3 DAYS Performed at [facility], [address], [place], [place] [address]    Report Status PENDING  Incomplete  MRSA PCR Screening     Status: None   Collection Time: 07/24/18  2:08 PM  Result Value Ref Range Status   MRSA by PCR NEGATIVE NEGATIVE Final    Comment:        The GeneXpert MRSA Assay (FDA approved for NASAL specimens only), is one component of a comprehensive MRSA colonization surveillance program. It is not  intended to diagnose MRSA infection nor to guide or monitor treatment for MRSA infections. Performed at [facility], [address], [place], [place] [address]     Studies: No results found. Scheduled Meds: . amiodarone  200 mg Oral Daily  . apixaban  5 mg Oral BID  . Chlorhexidine Gluconate Cloth  6 each Topical Q0600  . ferrous sulfate  325 mg Oral Q breakfast  . fluticasone  2 spray Each Nare Daily  . furosemide  20 mg Intravenous BID  . levothyroxine  50 mcg Oral Q0600  . loratadine  10 mg Oral QHS  . mouth rinse  15 mL Mouth Rinse BID  . metoprolol succinate  25 mg Oral Daily  . montelukast  10 mg Oral QHS  . rosuvastatin  10 mg Oral q1800  . zolpidem  5 mg Oral QHS   Continuous Infusions: . sodium chloride  Stopped (07/23/2018 2016)  . diltiazem (CARDIZEM) infusion 5 mg/hr (07/25/18 0306)    Principal Problem:   Volume overload Active Problems:   HYPERCHOLESTEROLEMIA   Coronary artery disease   Prolonged Q-T interval on ECG   Chronic kidney disease (CKD), stage III (moderate) (HCC)   Anemia due to chronic blood loss   AF (paroxysmal atrial fibrillation) (HCC)   Anxiety and depression   Acute diastolic (congestive) heart failure (HCC)   Essential hypertension   GERD (gastroesophageal reflux disease) s/p Toupet fundoplication (partial posterior wrap)   Hypothyroidism   Elevated troponin   Leukocytosis   Acute on chronic diastolic heart failure (HCC)   Rapid atrial fibrillation (HCC)   Anemia of chronic disease  Time spent:   Bonnell Public, MD [facility] 07/25/2018, 11:03 AM    LOS: 2 days  How to contact the [facility] Attending or Consulting provider [place] or covering provider during after hours 7P -7A, for this patient?  1. Check the care team in [facility] and look for a) attending/consulting TRH provider listed and b) the [facility] team listed 2. Log into [email or web address] and use [place]'s universal password to access. If you do not have the password, please  contact the hospital operator. 3. Locate the [facility] provider you are looking for under [facility] and page to a number that you can be directly reached. 4. If you still have difficulty reaching the provider, please page the [facility] (Director on Call) for the Hospitalists listed on amion for assistance.

## 2018-07-25 NOTE — Progress Notes (Signed)
Night shift stepdown coverage note.  The patient was seen due to tachypnea in the high 20s per minute and increased oxygen requirement to 10 LPN via high flow [facility].  She has also been persistently tachycardic in the last few hours.  Her most recent hemoglobin was 8.8 g/dL on the morning of the 24th.  This morning sodium was 132, potassium 3.1, chloride 95 and CO2 26 mmol/L.  Glucose 102, BUN 24, creatinine 1.18, calcium seven-point a.m. magnesium 2.1 g/dL. Repeat portable chest radiograph did not show persistent cardiomegaly and CHF, practically unchanged from my initial evaluation 3 nights ago.  Plan: Will give extra dose of furosemide 20 mg IVP. Start fluid restriction 1200 mL/day. Replace potassium. Nitropaste overnight. If no improvement, BiPAP PRN.  Tennis Must, MD

## 2018-07-26 LAB — RENAL FUNCTION PANEL
Albumin: 2.5 g/dL — ABNORMAL LOW (ref 3.5–5.0)
Anion gap: 10 (ref 5–15)
BUN: 24 mg/dL — ABNORMAL HIGH (ref 8–23)
CO2: 26 mmol/L (ref 22–32)
Calcium: 7.8 mg/dL — ABNORMAL LOW (ref 8.9–10.3)
Chloride: 95 mmol/L — ABNORMAL LOW (ref 98–111)
Creatinine, Ser: 1.2 mg/dL — ABNORMAL HIGH (ref 0.44–1.00)
GFR calc Af Amer: 49 mL/min — ABNORMAL LOW (ref >60)
GFR calc non Af Amer: 42 mL/min — ABNORMAL LOW (ref >60)
Glucose, Bld: 112 mg/dL — ABNORMAL HIGH (ref 70–99)
Phosphorus: 1.6 mg/dL — ABNORMAL LOW (ref 2.5–4.6)
Potassium: 5.1 mmol/L (ref 3.5–5.1)
Sodium: 131 mmol/L — ABNORMAL LOW (ref 135–145)

## 2018-07-26 LAB — MAGNESIUM: Magnesium: 2.1 mg/dL (ref 1.7–2.4)

## 2018-07-26 MED ORDER — ALBUMIN HUMAN 25 % IV SOLN
25.0000 g | Freq: Four times a day (QID) | INTRAVENOUS | Status: DC
  Administered 2018-07-26 – 2018-07-28 (×7): 25 g via INTRAVENOUS
  Filled 2018-07-26 (×3): qty 50
  Filled 2018-07-26 (×5): qty 100

## 2018-07-26 MED ORDER — FUROSEMIDE 10 MG/ML IJ SOLN
40.0000 mg | Freq: Two times a day (BID) | INTRAMUSCULAR | Status: DC
  Administered 2018-07-26 – 2018-07-27 (×2): 40 mg via INTRAVENOUS
  Filled 2018-07-26 (×2): qty 4

## 2018-07-26 MED ORDER — FUROSEMIDE 10 MG/ML IJ SOLN
40.0000 mg | Freq: Once | INTRAMUSCULAR | Status: AC
  Administered 2018-07-26: 08:00:00 40 mg via INTRAVENOUS
  Filled 2018-07-26: qty 4

## 2018-07-26 NOTE — Progress Notes (Signed)
PROGRESS NOTE [name]  KXF:[id number]  DOB: [birth year]  DOA: 07/11/2018 PCP: Terald Sleeper, PA-C   Brief Admission Hx: 84 year old female with coronary artery disease, diastolic congestive heart failure, chronic atrial fibrillation and recent NSTEMI presented with acute on chronic diastolic congestive heart failure exacerbation with shortness of breath and hypoxia and progressive weakness.  07/26/2018: Respiratory distress noted last night.  Extra dose of Lasix, IV Lasix 40 Mg given.  Symptoms have improved.  Will change the dose of Lasix from IV Lasix 20 mg twice daily to 40 Mg twice daily.  Albumin is 2.5.  We will also start patient on IV albumin.  Will monitor patient's respiratory status closely while on IV albumin.  MDM/Assessment & Plan:   Acute diastolic CHF exacerbation with volume overload- is clinically improving and responding to IV diuresis with Lasix.  We are monitoring renal function closely and it is holding stable at this time.  She remains short of breath and deconditioned.  PT recommending SNF placement. 07/25/2018: Patient is still requiring 7 L of supplemental oxygen.  We will continue IV diuretics.  Will add nebs Pulmicort.  Will repeat chest x-[place] in the morning. 07/26/2018: Increase dose of IV Lasix as above.  Prolonged QT-persists, she was given an additional dose of IV magnesium, her Celexa has been discontinued, Protonix has been discontinued, she remains on amiodarone.  Repeat EKG in a.m.  Check magnesium and electrolytes in a.m. 07/25/2018: EKG done today revealed QTc interval of 560 ms.  Potassium is 3.1.  Will optimize potassium.  Repeat EKG in the morning. 07/26/2018: QTc interval has improved significantly.  Potassium is 5.1.  See EKG done earlier this morning.  Elevated troponin- mild elevation secondary to demand ischemia associated with congestive heart failure exacerbation, do not suspect ACS.  Patient has no chest pain symptoms.  Leukocytosis-WBC trending  down.  Repeat CBC in the morning.  Hyponatremia-secondary to volume overload-this is improved with diuresis and now sodium has corrected. 07/26/2018: Likely related to volume.  May also have prognostic significance.  Stage III CKD-creatinine has improved slightly with diuresis.  Continue to monitor closely. 07/26/2018: Serum creatinine is slowly improving with diuresis.  There may be component of cardiorenal syndrome.  Continue to monitor closely.  Chronic atrial fibrillation- she is rate controlled on amiodarone and Toprol-XL.  She is fully anticoagulated with apixaban.  Essential hypertension-her blood pressures remain controlled however she has had some soft blood pressure readings and we have discontinued the hydralazine and reduced the doses of some of her blood pressure medicines. 07/26/2018: Low normal blood pressure.  Monitor closely.  Hypothyroidism- continue levothyroxine 50 mcg daily.  DVT prophylaxis: Apixaban Code Status: Full Family Communication:  Disposition Plan: SNF placement when medically stable   Consultants:    Procedures:    Antimicrobials:     Subjective: Patient seen alongside patient's nurse. No new complaints. Still requiring 7 liters of supplemental oxygen.  Objective: Vitals:   07/26/18 0900 07/26/18 1000 07/26/18 1059 07/26/18 1100  BP: 119/74 96/66  (!) 120/92  Pulse: (!) 105 94 94 (!) 106  Resp: (!) 24 20 (!) 27 (!) 27  Temp:   (!) 97.4 F (36.3 C)   TempSrc:   Axillary   SpO2: 95% 100% 93% 94%  Weight:      Height:        Intake/Output Summary (Last 24 hours) at 07/26/2018 1151 Last data filed at 07/26/2018 0900 Gross per 24 hour  Intake 1029.25 ml  Output 545 ml  Net 484.25 ml   Filed Weights   07/24/18 0500 07/25/18 0401 07/26/18 0630  Weight: 69.7 kg 68 kg 68.9 kg    REVIEW OF SYSTEMS  As per history otherwise all reviewed and reported negative  Exam:  General exam: Thin lady.  Not in any distress.   Respiratory  system: Decreased air entry.  Mild expiratory wheeze Cardiovascular system: S1-S2.  Gastrointestinal system: Abdomen is nondistended, soft and nontender. Normal bowel sounds heard. Central nervous system: Alert and oriented. No focal neurological deficits.  Data Reviewed: Basic Metabolic Panel: Recent Labs  Lab 07/24/2018 1455 07/23/18 0626 07/24/18 0426 07/25/18 0358 07/25/18 1124 07/26/18 0656  NA 132* 136 135 132*  --  131*  K 4.6 4.4 3.7 3.1*  --  5.1  CL 99 103 100 95*  --  95*  CO2 23 23 24 26   --  26  GLUCOSE 115* 100* 80 102*  --  112*  BUN 21 24* 24* 24*  --  24*  CREATININE 1.31* 1.33* 1.22* 1.18*  --  1.20*  CALCIUM 8.4* 8.1* 7.9* 7.8*  --  7.8*  MG 2.2  --   --   --  2.1 2.1  PHOS  --   --   --   --   --  1.6*   Liver Function Tests: Recent Labs  Lab 07/27/2018 1455 07/26/18 0656  AST 40  --   ALT 28  --   ALKPHOS 138*  --   BILITOT 0.8  --   PROT 6.9  --   ALBUMIN 3.0* 2.5*   No results for input(s): LIPASE, AMYLASE in the last 168 hours. No results for input(s): AMMONIA in the last 168 hours. CBC: Recent Labs  Lab 07/27/2018 1455 07/24/18 0426  WBC 14.6* 11.7*  NEUTROABS 11.9*  --   HGB 10.0* 8.8*  HCT 33.2* 30.0*  MCV 83.8 85.2  PLT 404* 328   Cardiac Enzymes: Recent Labs  Lab 07/16/2018 1455 07/03/2018 1738 07/23/18 0038 07/23/18 0626  TROPONINI 0.06* 0.05* 0.06* 0.05*   CBG (last 3)  No results for input(s): GLUCAP in the last 72 hours. Recent Results (from the past 240 hour(s))  Culture, blood (routine x 2)     Status: None (Preliminary result)   Collection Time: 07/13/2018  3:15 PM  Result Value Ref Range Status   Specimen Description RIGHT ANTECUBITAL  Final   Special Requests   Final    BOTTLES DRAWN AEROBIC AND ANAEROBIC Blood Culture adequate volume   Culture   Final    NO GROWTH 4 DAYS Performed at [facility], [address], [place], [place] [address]    Report Status PENDING  Incomplete  Culture, blood (routine x 2)     Status:  None (Preliminary result)   Collection Time: 07/12/2018  3:15 PM  Result Value Ref Range Status   Specimen Description BLOOD RIGHT WRIST  Final   Special Requests   Final    BOTTLES DRAWN AEROBIC AND ANAEROBIC Blood Culture adequate volume   Culture   Final    NO GROWTH 4 DAYS Performed at [facility], [address] [place], [place] [address]    Report Status PENDING  Incomplete  MRSA PCR Screening     Status: None   Collection Time: 07/24/18  2:08 PM  Result Value Ref Range Status   MRSA by PCR NEGATIVE NEGATIVE Final    Comment:        The GeneXpert MRSA Assay (FDA approved for NASAL  specimens only), is one component of a comprehensive MRSA colonization surveillance program. It is not intended to diagnose MRSA infection nor to guide or monitor treatment for MRSA infections. Performed at [facility], [address] [place], [place] [address]     Studies: Dg Chest [facility] 1 View  Result Date: 07/25/2018 CLINICAL DATA:  Dyspnea EXAM: PORTABLE CHEST 1 VIEW COMPARISON:  07/20/2018 FINDINGS: Cardiomegaly with small pleural effusions and severe pulmonary edema, unchanged. IMPRESSION: Unchanged severe CHF. Electronically Signed   By: Ulyses Jarred M.D.   On: 07/25/2018 22:10   Scheduled Meds: . amiodarone  200 mg Oral Daily  . apixaban  5 mg Oral BID  . budesonide (PULMICORT) nebulizer solution  0.25 mg Nebulization BID  . Chlorhexidine Gluconate Cloth  6 each Topical Q0600  . ferrous sulfate  325 mg Oral Q breakfast  . fluticasone  2 spray Each Nare Daily  . furosemide  40 mg Intravenous BID  . levothyroxine  50 mcg Oral Q0600  . loratadine  10 mg Oral QHS  . mouth rinse  15 mL Mouth Rinse BID  . metoprolol succinate  25 mg Oral Daily  . montelukast  10 mg Oral QHS  . potassium chloride  20 mEq Oral BID  . rosuvastatin  10 mg Oral q1800  . zolpidem  5 mg Oral QHS   Continuous Infusions: . sodium chloride Stopped (07/27/2018 2016)  . albumin human    . diltiazem (CARDIZEM)  infusion 5 mg/hr (07/26/18 0651)    Principal Problem:   Volume overload Active Problems:   HYPERCHOLESTEROLEMIA   Coronary artery disease   Prolonged Q-T interval on ECG   Chronic kidney disease (CKD), stage III (moderate) (HCC)   Anemia due to chronic blood loss   AF (paroxysmal atrial fibrillation) (HCC)   Anxiety and depression   Acute diastolic (congestive) heart failure (HCC)   Essential hypertension   GERD (gastroesophageal reflux disease) s/p Toupet fundoplication (partial posterior wrap)   Hypothyroidism   Elevated troponin   Leukocytosis   Acute on chronic diastolic heart failure (HCC)   Rapid atrial fibrillation (HCC)   Anemia of chronic disease  Time spent:   Bonnell Public, MD [facility] 07/26/2018, 11:51 AM    LOS: 3 days  How to contact the [facility] Attending or Consulting provider [place] or covering provider during after hours 7P -7A, for this patient?  1. Check the care team in [facility] and look for a) attending/consulting TRH provider listed and b) the [facility] team listed 2. Log into [email or web address] and use [place]'s universal password to access. If you do not have the password, please contact the hospital operator. 3. Locate the [facility] provider you are looking for under [facility] and page to a number that you can be directly reached. 4. If you still have difficulty reaching the provider, please page the [facility] (Director on Call) for the Hospitalists listed on amion for assistance.

## 2018-07-27 ENCOUNTER — Inpatient Hospital Stay (HOSPITAL_COMMUNITY): Payer: Medicare Other

## 2018-07-27 DIAGNOSIS — I4819 Other persistent atrial fibrillation: Secondary | ICD-10-CM

## 2018-07-27 DIAGNOSIS — I25119 Atherosclerotic heart disease of native coronary artery with unspecified angina pectoris: Secondary | ICD-10-CM

## 2018-07-27 DIAGNOSIS — I482 Chronic atrial fibrillation, unspecified: Secondary | ICD-10-CM

## 2018-07-27 DIAGNOSIS — J9621 Acute and chronic respiratory failure with hypoxia: Secondary | ICD-10-CM

## 2018-07-27 DIAGNOSIS — I5033 Acute on chronic diastolic (congestive) heart failure: Secondary | ICD-10-CM

## 2018-07-27 LAB — BASIC METABOLIC PANEL
Anion gap: 10 (ref 5–15)
BUN: 24 mg/dL — ABNORMAL HIGH (ref 8–23)
CO2: 28 mmol/L (ref 22–32)
Calcium: 8.1 mg/dL — ABNORMAL LOW (ref 8.9–10.3)
Chloride: 94 mmol/L — ABNORMAL LOW (ref 98–111)
Creatinine, Ser: 1.31 mg/dL — ABNORMAL HIGH (ref 0.44–1.00)
GFR calc Af Amer: 44 mL/min — ABNORMAL LOW (ref >60)
GFR calc non Af Amer: 38 mL/min — ABNORMAL LOW (ref >60)
Glucose, Bld: 115 mg/dL — ABNORMAL HIGH (ref 70–99)
Potassium: 4.5 mmol/L (ref 3.5–5.1)
Sodium: 132 mmol/L — ABNORMAL LOW (ref 135–145)

## 2018-07-27 LAB — CULTURE, BLOOD (ROUTINE X 2)
Culture: NO GROWTH
Culture: NO GROWTH
Special Requests: ADEQUATE
Special Requests: ADEQUATE

## 2018-07-27 LAB — CBC WITH DIFFERENTIAL/PLATELET
Abs Immature Granulocytes: 0.12 10*3/uL — ABNORMAL HIGH (ref 0.00–0.07)
Basophils Absolute: 0 10*3/uL (ref 0.0–0.1)
Basophils Relative: 0 %
Eosinophils Absolute: 0.1 10*3/uL (ref 0.0–0.5)
Eosinophils Relative: 0 %
HCT: 28.6 % — ABNORMAL LOW (ref 36.0–46.0)
Hemoglobin: 8.6 g/dL — ABNORMAL LOW (ref 12.0–15.0)
Immature Granulocytes: 1 %
Lymphocytes Relative: 12 %
Lymphs Abs: 1.5 10*3/uL (ref 0.7–4.0)
MCH: 24.9 pg — ABNORMAL LOW (ref 26.0–34.0)
MCHC: 30.1 g/dL (ref 30.0–36.0)
MCV: 82.7 fL (ref 80.0–100.0)
Monocytes Absolute: 0.7 10*3/uL (ref 0.1–1.0)
Monocytes Relative: 6 %
Neutro Abs: 9.4 10*3/uL — ABNORMAL HIGH (ref 1.7–7.7)
Neutrophils Relative %: 81 %
Platelets: 319 10*3/uL (ref 150–400)
RBC: 3.46 MIL/uL — ABNORMAL LOW (ref 3.87–5.11)
RDW: 17.9 % — ABNORMAL HIGH (ref 11.5–15.5)
WBC: 11.7 10*3/uL — ABNORMAL HIGH (ref 4.0–10.5)
nRBC: 0.3 % — ABNORMAL HIGH (ref 0.0–0.2)

## 2018-07-27 LAB — RENAL FUNCTION PANEL
Albumin: 3.2 g/dL — ABNORMAL LOW (ref 3.5–5.0)
Anion gap: 11 (ref 5–15)
BUN: 24 mg/dL — ABNORMAL HIGH (ref 8–23)
CO2: 27 mmol/L (ref 22–32)
Calcium: 8.1 mg/dL — ABNORMAL LOW (ref 8.9–10.3)
Chloride: 94 mmol/L — ABNORMAL LOW (ref 98–111)
Creatinine, Ser: 1.31 mg/dL — ABNORMAL HIGH (ref 0.44–1.00)
GFR calc Af Amer: 44 mL/min — ABNORMAL LOW (ref >60)
GFR calc non Af Amer: 38 mL/min — ABNORMAL LOW (ref >60)
Glucose, Bld: 114 mg/dL — ABNORMAL HIGH (ref 70–99)
Phosphorus: 1.7 mg/dL — ABNORMAL LOW (ref 2.5–4.6)
Potassium: 4.5 mmol/L (ref 3.5–5.1)
Sodium: 132 mmol/L — ABNORMAL LOW (ref 135–145)

## 2018-07-27 LAB — MAGNESIUM: Magnesium: 2.1 mg/dL (ref 1.7–2.4)

## 2018-07-27 MED ORDER — METOPROLOL SUCCINATE ER 25 MG PO TB24
25.0000 mg | ORAL_TABLET | Freq: Once | ORAL | Status: AC
  Administered 2018-07-27: 12:00:00 25 mg via ORAL
  Filled 2018-07-27: qty 1

## 2018-07-27 MED ORDER — FUROSEMIDE 10 MG/ML IJ SOLN
40.0000 mg | Freq: Once | INTRAMUSCULAR | Status: AC
  Administered 2018-07-27: 40 mg via INTRAVENOUS
  Filled 2018-07-27: qty 4

## 2018-07-27 MED ORDER — MORPHINE SULFATE (CONCENTRATE) 10 MG/0.5ML PO SOLN
2.5000 mg | ORAL | Status: DC | PRN
  Administered 2018-07-27: 2.6 mg via ORAL
  Filled 2018-07-27: qty 0.5

## 2018-07-27 MED ORDER — FUROSEMIDE 10 MG/ML IJ SOLN
60.0000 mg | Freq: Every day | INTRAMUSCULAR | Status: DC
  Filled 2018-07-27: qty 6

## 2018-07-27 MED ORDER — METOPROLOL SUCCINATE ER 50 MG PO TB24
50.0000 mg | ORAL_TABLET | Freq: Every day | ORAL | Status: DC
  Filled 2018-07-27: qty 1

## 2018-07-27 MED ORDER — AMIODARONE HCL 200 MG PO TABS
200.0000 mg | ORAL_TABLET | Freq: Two times a day (BID) | ORAL | Status: DC
  Administered 2018-07-27: 200 mg via ORAL
  Filled 2018-07-27 (×2): qty 1

## 2018-07-27 NOTE — Progress Notes (Signed)
Pt having labored breathing- RR 30s, crackles in bilateral lung bases. RT in room giving breathing treatment, oxygen requirements increased to 10L HFNC. K. Schorr paged to see if pt can have an extra dose of Lasix. BUN & creatinine have remained stable. Waiting for call back/orders. Will continue to monitor pt

## 2018-07-27 NOTE — Progress Notes (Signed)
Progress Note  Patient Name: [name] Date of Encounter: 07/27/2018  Primary Cardiologist: Kate Sable MD  Subjective   Feels somewhat better in terms of shortness of breath.  No palpitations or chest pain.  Inpatient Medications    Scheduled Meds: . amiodarone  200 mg Oral Daily  . apixaban  5 mg Oral BID  . budesonide (PULMICORT) nebulizer solution  0.25 mg Nebulization BID  . Chlorhexidine Gluconate Cloth  6 each Topical Q0600  . ferrous sulfate  325 mg Oral Q breakfast  . fluticasone  2 spray Each Nare Daily  . furosemide  40 mg Intravenous BID  . levothyroxine  50 mcg Oral Q0600  . loratadine  10 mg Oral QHS  . mouth rinse  15 mL Mouth Rinse BID  . metoprolol succinate  25 mg Oral Daily  . montelukast  10 mg Oral QHS  . rosuvastatin  10 mg Oral q1800  . zolpidem  5 mg Oral QHS   Continuous Infusions: . sodium chloride Stopped (07/14/2018 2016)  . albumin human 25 g (07/27/18 0542)  . diltiazem (CARDIZEM) infusion 5 mg/hr (07/26/18 1805)   PRN Meds: sodium chloride, acetaminophen **OR** acetaminophen, ALPRAZolam, levalbuterol, meclizine, nitroGLYCERIN   Vital Signs    Vitals:   07/27/18 0830 07/27/18 0900 07/27/18 0930 07/27/18 1000  BP: 121/70 121/71 125/68 124/74  Pulse: (!) 135 90 88 96  Resp: (!) 26 (!) 26 (!) 26 (!) 24  Temp:      TempSrc:      SpO2: (!) 89% 93% 94% 95%  Weight:      Height:        Intake/Output Summary (Last 24 hours) at 07/27/2018 1100 Last data filed at 07/27/2018 1056 Gross per 24 hour  Intake 1166.72 ml  Output 2500 ml  Net -1333.28 ml   Filed Weights   07/25/18 0401 07/26/18 0630 07/27/18 0354  Weight: 68 kg 68.9 kg 67 kg    Telemetry    Atrial fibrillation.  Personally reviewed.  ECG    Tracing from 07/26/2018 shows atrial fibrillation with IVCD and diffuse ST-T wave abnormalities.  Personally reviewed.  Physical Exam   GEN:  Elderly woman.  No acute distress.   Neck: No JVD. Cardiac:  Irregularly  irregular, soft systolic murmur, no gallop.  Respiratory:  Decreased breath sounds at the bases with diminished air movement.Marland Kitchen GI: Soft, nontender, bowel sounds present. MS:  Mild leg edema; No deformity. Neuro:  Nonfocal. Psych: Alert and oriented x 3. Normal affect.  Labs    Chemistry Recent Labs  Lab 07/09/2018 1455  07/25/18 0358 07/26/18 0656 07/27/18 0536  NA 132*   < > 132* 131* 132*  132*  K 4.6   < > 3.1* 5.1 4.5  4.5  CL 99   < > 95* 95* 94*  94*  CO2 23   < > 26 26 27  28   GLUCOSE 115*   < > 102* 112* 114*  115*  BUN 21   < > 24* 24* 24*  24*  CREATININE 1.31*   < > 1.18* 1.20* 1.31*  1.31*  CALCIUM 8.4*   < > 7.8* 7.8* 8.1*  8.1*  PROT 6.9  --   --   --   --   ALBUMIN 3.0*  --   --  2.5* 3.2*  AST 40  --   --   --   --   ALT 28  --   --   --   --  ALKPHOS 138*  --   --   --   --   BILITOT 0.8  --   --   --   --   GFRNONAA 38*   < > 43* 42* 38*  38*  GFRAA 44*   < > 50* 49* 44*  44*  ANIONGAP 10   < > 11 10 11  10    < > = values in this interval not displayed.     Hematology Recent Labs  Lab 07/11/2018 1455 07/24/18 0426 07/27/18 0536  WBC 14.6* 11.7* 11.7*  RBC 3.96 3.52* 3.46*  HGB 10.0* 8.8* 8.6*  HCT 33.2* 30.0* 28.6*  MCV 83.8 85.2 82.7  MCH 25.3* 25.0* 24.9*  MCHC 30.1 29.3* 30.1  RDW 17.8* 17.8* 17.9*  PLT 404* 328 319    Cardiac Enzymes Recent Labs  Lab 07/06/2018 1455 07/11/2018 1738 07/23/18 0038 07/23/18 0626  TROPONINI 0.06* 0.05* 0.06* 0.05*   No results for input(s): TROPIPOC in the last 168 hours.   BNP Recent Labs  Lab 07/24/2018 1455  BNP 2,781.0*     Radiology    Dg Chest Port 1 View  Result Date: 07/27/2018 CLINICAL DATA:  Congestive heart failure. EXAM: PORTABLE CHEST 1 VIEW COMPARISON:  Radiograph of July 25, 2018. FINDINGS: Stable cardiomegaly. Stable diffuse interstitial opacities are noted throughout both lungs concerning for edema. Stable bilateral pleural effusions are noted, right greater than left. No  pneumothorax is noted. Bony thorax is unremarkable. IMPRESSION: Stable cardiomegaly with bilateral pulmonary edema and pleural effusions. Electronically Signed   By: Marijo Conception M.D.   On: 07/27/2018 07:42   Dg Chest Port 1 View  Result Date: 07/25/2018 CLINICAL DATA:  Dyspnea EXAM: PORTABLE CHEST 1 VIEW COMPARISON:  07/21/2018 FINDINGS: Cardiomegaly with small pleural effusions and severe pulmonary edema, unchanged. IMPRESSION: Unchanged severe CHF. Electronically Signed   By: Ulyses Jarred M.D.   On: 07/25/2018 22:10    Cardiac Studies   Echocardiogram 06/07/2018:  1. The left ventricle has normal systolic function with an ejection fraction of 60-65%. The cavity size was normal. Left ventricular diastolic Doppler parameters are indeterminate in the setting of atrial fibrillation. No obvious wall motion  abnormalities.  2. The right ventricle has normal systolic function. The cavity was normal. There is no increase in right ventricular wall thickness. Right ventricular systolic pressure upper normal with an estimated pressure of 34.6 mmHg.  3. Left atrial size was moderately dilated.  4. The aortic valve is tricuspid. Mild calcification of the aortic valve. Moderate aortic annular calcification noted.  5. The mitral valve is normal in structure. Mitral valve regurgitation is mild to moderate by color flow Doppler.  6. The tricuspid valve is normal in structure.  7. The aortic root is normal in size and structure.  Patient Profile     84 y.o. female with a history of CAD status post DES to the LAD in 9935, chronic diastolic heart failure, pulmonary hypertension, essential hypertension, hypothyroidism, and paroxysmal atrial fibrillation.  Acute on chronic diastolic heart failure complicated by rapid atrial fibrillation.  Assessment & Plan    1.  Persistent atrial fibrillation, CHADSVASC score is 6.  Currently on amiodarone,, and intravenous diltiazem for rate control.  On Eliquis for stroke  prophylaxis.  2.  Acute on chronic diastolic heart failure.  She has diuresed approximately 2 L net output with IV Lasix.  Creatinine is starting to bump up some.  3.  CAD status post DES to the LAD in 2013.  No active angina symptoms at this time.  Minor troponin I elevation s most consistent with demand ischemia.  4.  Essential hypertension.  Currently off hydralazine, losartan, and Imdur to allow titration of AV nodal blockers.  5.  CKD stage III.  6.  Prolonged QTc, Celexa and Protonix discontinued.  Plan to increase Toprol-XL to 50 mg daily and increase amiodarone to 200 mg twice daily for now.  This should allow for weaning off of IV diltiazem.  Otherwise continue Eliquis, not clear that we will need to pursue a cardioversion now as she is clinically improving at this point.  Reduce Lasix to 60 mg IV once daily.  Follow-up ECG and BMET in a.m.  Signed, Rozann Lesches, MD  07/27/2018, 11:00 AM

## 2018-07-27 NOTE — Progress Notes (Signed)
Patient appears to be doing better. Oxygen down to 7 lpm.

## 2018-07-27 NOTE — Progress Notes (Signed)
PROGRESS NOTE  [name] UUV:[id number] DOB: Feb 06, 1935 DOA: 07/20/2018 PCP: Terald Sleeper, PA-C  Brief History:  84 year old female with a history of chronic blood loss anemia, anxiety, atrophic left kidney, colon cancer status post colostomy, CKD stage III, GERD, [name]'s esophagus, hypertension, hypothyroidism, pulmonary hypertension, and paroxysmal atrial fibrillation presenting with generalized weakness and shortness of breath.  The patient had oxygen saturation of 86% upon EMS arrival.  She had denied a fevers, sore throat, wheezing, coughing, hemoptysis.  This represents the patient's fourth admission in the past 6 weeks.  The patient was initially admitted in March for robotic repair of incarcerated recurrent hiatal hernia that was complicated by postop A. fib with marked "asymptomatic" elevation of troponin to 13.58 and started on amiodarone and Eliquis.  She was readmitted a few days later after discharge due to paroxysmal A. fib with RVR and acute kidney injury.  She was supposed to have TEE cardioversion, but subsequently converted to sinus rhythm.  She was readmitted on 07/14/2018 for a total of 4 days after presenting there with dyspnea and she was treated for HCAP.  COVID-19 test was negative.  Upon presentation, the patient was noted to have BNP 2781.  Chest x-Chaunice showed pulmonary edema and bilateral pleural effusions.  She was started on IV furosemide.  Cardiology was consulted to assist with management.  Assessment/Plan: Acute on chronic respiratory failure with hypoxia -Patient is on 2 L nasal cannula baseline -Secondary to pulmonary edema pleural effusions -Currently on 7 L HFNC -Wean oxygen for saturation greater 92%  Acute on chronic diastolic CHF -I/Os incomplete -Continue IV furosemide -Weight has improved from 153.6-->147.7 lbs -06/07/2018 echo EF 60 to 65%, no WMA -remains fluid overloaded  Persistent atrial fibrillation -Concerned this may be contributing  to the patient's fluid overload -Cardiology consultation although not sure pt's respiratory status would allow for cardioversion -Continue diltiazem IV -Continue metoprolol succinate -Continue apixaban -Continue amiodarone  Essential hypertension -Losartan and Imdur stopped to allow for margin for titration of AV nodal agents -Blood pressure controlled  CKD stage III -Baseline creatinine 1.1-1.4 -Monitor with diuresis  Elevated troponin -Not consistent with ACS -Trend is flat -Secondary to CHF and tachycardia  Hyponatremia -Secondary to fluid overload -Continue diuresis  Hypothyroidism -Continue levothyroxine  Hyperlipidemia -Continue statin  Prolonged QTc -celexa and protonix discontinued      Disposition Plan:   Remain in SDU, not stable for d/c Family Communication:   No Family at bedside  Consultants:  cardiology  Code Status:  FULL  DVT Prophylaxis:  apixaban   Procedures: As Listed in Progress Note Above  Antibiotics: None       Subjective: Pt complains of dyspnea slightly improved.  Patient denies fevers, chills, headache, chest pain, vomiting, diarrhea, abdominal pain, dysuria, hematuria, hematochezia, and melena.  She has some nausea   Objective: Vitals:   07/27/18 0753 07/27/18 0800 07/27/18 0802 07/27/18 0830  BP:   (!) 125/95 121/70  Pulse:  (!) 122 75 (!) 135  Resp:  (!) 33 (!) 31 (!) 26  Temp: 97.8 F (36.6 C)     TempSrc: Oral     SpO2: 95% 93% 94% (!) 89%  Weight:      Height:        Intake/Output Summary (Last 24 hours) at 07/27/2018 0902 Last data filed at 07/27/2018 0500 Gross per 24 hour  Intake 1166.72 ml  Output 1550 ml  Net -383.28 ml   Weight  change: -1.9 kg Exam:   General:  Pt is alert, follows commands appropriately, not in acute distress  HEENT: No icterus, No thrush, No neck mass, [place]/AT  Cardiovascular: IRRR, S1/S2, no rubs, no gallops  Respiratory: bilateral crackles, no wheeze  Abdomen:  Soft/+BS, non tender, non distended, no guarding  Extremities: trace LE edema, No lymphangitis, No petechiae, No rashes, no synovitis   Data Reviewed: I have personally reviewed following labs and imaging studies Basic Metabolic Panel: Recent Labs  Lab 07/21/2018 1455 07/23/18 0626 07/24/18 0426 07/25/18 0358 07/25/18 1124 07/26/18 0656 07/27/18 0536  NA 132* 136 135 132*  --  131* 132*   132*  K 4.6 4.4 3.7 3.1*  --  5.1 4.5   4.5  CL 99 103 100 95*  --  95* 94*   94*  CO2 23 23 24 26   --  26 27   28   GLUCOSE 115* 100* 80 102*  --  112* 114*   115*  BUN 21 24* 24* 24*  --  24* 24*   24*  CREATININE 1.31* 1.33* 1.22* 1.18*  --  1.20* 1.31*   1.31*  CALCIUM 8.4* 8.1* 7.9* 7.8*  --  7.8* 8.1*   8.1*  MG 2.2  --   --   --  2.1 2.1 2.1  PHOS  --   --   --   --   --  1.6* 1.7*   Liver Function Tests: Recent Labs  Lab 07/16/2018 1455 07/26/18 0656 07/27/18 0536  AST 40  --   --   ALT 28  --   --   ALKPHOS 138*  --   --   BILITOT 0.8  --   --   PROT 6.9  --   --   ALBUMIN 3.0* 2.5* 3.2*   No results for input(s): LIPASE, AMYLASE in the last 168 hours. No results for input(s): AMMONIA in the last 168 hours. Coagulation Profile: No results for input(s): INR, PROTIME in the last 168 hours. CBC: Recent Labs  Lab 07/30/2018 1455 07/24/18 0426 07/27/18 0536  WBC 14.6* 11.7* 11.7*  NEUTROABS 11.9*  --  9.4*  HGB 10.0* 8.8* 8.6*  HCT 33.2* 30.0* 28.6*  MCV 83.8 85.2 82.7  PLT 404* 328 319   Cardiac Enzymes: Recent Labs  Lab 07/12/2018 1455 07/01/2018 1738 07/23/18 0038 07/23/18 0626  TROPONINI 0.06* 0.05* 0.06* 0.05*   BNP: Invalid input(s): POCBNP CBG: No results for input(s): GLUCAP in the last 168 hours. HbA1C: No results for input(s): HGBA1C in the last 72 hours. Urine analysis:    Component Value Date/Time   COLORURINE YELLOW 07/27/2018 1723   APPEARANCEUR CLEAR 07/27/2018 1723   APPEARANCEUR Clear 05/13/2016 0856   LABSPEC 1.012 07/09/2018 1723   PHURINE  5.0 07/03/2018 1723   GLUCOSEU NEGATIVE 07/29/2018 1723   HGBUR SMALL (A) 07/30/2018 1723   BILIRUBINUR NEGATIVE 07/05/2018 1723   BILIRUBINUR Negative 05/13/2016 0856   [place] 07/16/2018 1723   PROTEINUR NEGATIVE 07/21/2018 1723   UROBILINOGEN 0.2 02/10/2015 1936   NITRITE NEGATIVE 07/11/2018 1723   LEUKOCYTESUR NEGATIVE 07/15/2018 1723   Sepsis Labs: @LABRCNTIP (procalcitonin:4,lacticidven:4) ) Recent Results (from the past 240 hour(s))  Culture, blood (routine x 2)     Status: None (Preliminary result)   Collection Time: 07/15/2018  3:15 PM  Result Value Ref Range Status   Specimen Description RIGHT ANTECUBITAL  Final   Special Requests   Final    BOTTLES DRAWN AEROBIC AND ANAEROBIC Blood Culture adequate  volume   Culture   Final    NO GROWTH 4 DAYS Performed at [facility], [address] [place], [place] [address]    Report Status PENDING  Incomplete  Culture, blood (routine x 2)     Status: None (Preliminary result)   Collection Time: 07/24/2018  3:15 PM  Result Value Ref Range Status   Specimen Description BLOOD RIGHT WRIST  Final   Special Requests   Final    BOTTLES DRAWN AEROBIC AND ANAEROBIC Blood Culture adequate volume   Culture   Final    NO GROWTH 4 DAYS Performed at [facility], [address], [place], [place] [address]    Report Status PENDING  Incomplete  MRSA PCR Screening     Status: None   Collection Time: 07/24/18  2:08 PM  Result Value Ref Range Status   MRSA by PCR NEGATIVE NEGATIVE Final    Comment:        The GeneXpert MRSA Assay (FDA approved for NASAL specimens only), is one component of a comprehensive MRSA colonization surveillance program. It is not intended to diagnose MRSA infection nor to guide or monitor treatment for MRSA infections. Performed at [facility], [address] [place], [place] [address]      Scheduled Meds:  amiodarone  200 mg Oral Daily   apixaban  5 mg Oral BID   budesonide (PULMICORT) nebulizer  solution  0.25 mg Nebulization BID   Chlorhexidine Gluconate Cloth  6 each Topical Q0600   ferrous sulfate  325 mg Oral Q breakfast   fluticasone  2 spray Each Nare Daily   furosemide  40 mg Intravenous BID   levothyroxine  50 mcg Oral Q0600   loratadine  10 mg Oral QHS   mouth rinse  15 mL Mouth Rinse BID   metoprolol succinate  25 mg Oral Daily   montelukast  10 mg Oral QHS   rosuvastatin  10 mg Oral q1800   zolpidem  5 mg Oral QHS   Continuous Infusions:  sodium chloride Stopped (07/06/2018 2016)   albumin human 25 g (07/27/18 0542)   diltiazem (CARDIZEM) infusion 5 mg/hr (07/26/18 1805)    Procedures/Studies: Dg Chest 1 View  Result Date: 07/15/2018 CLINICAL DATA:  Followup right thoracentesis EXAM: CHEST  1 VIEW COMPARISON:  07/14/2018 FINDINGS: There is less pleural fluid on the right with better aeration of the right lung. Small to moderate effusion does persist. There is a small effusion on the left with left base atelectasis. IMPRESSION: Reduction in amount of pleural fluid on the right with better aeration of the right lung. No visible pneumothorax. Small to moderate right effusion does persist with right base volume loss. Small effusion persists on the left. Electronically Signed   By: Nelson Chimes M.D.   On: 07/15/2018 10:52   Dg Chest Port 1 View  Result Date: 07/27/2018 CLINICAL DATA:  Congestive heart failure. EXAM: PORTABLE CHEST 1 VIEW COMPARISON:  Radiograph of July 25, 2018. FINDINGS: Stable cardiomegaly. Stable diffuse interstitial opacities are noted throughout both lungs concerning for edema. Stable bilateral pleural effusions are noted, right greater than left. No pneumothorax is noted. Bony thorax is unremarkable. IMPRESSION: Stable cardiomegaly with bilateral pulmonary edema and pleural effusions. Electronically Signed   By: Marijo Conception M.D.   On: 07/27/2018 07:42   Dg Chest Port 1 View  Result Date: 07/25/2018 CLINICAL DATA:  Dyspnea EXAM:  PORTABLE CHEST 1 VIEW COMPARISON:  07/20/2018 FINDINGS: Cardiomegaly with small pleural effusions and severe  pulmonary edema, unchanged. IMPRESSION: Unchanged severe CHF. Electronically Signed   By: Ulyses Jarred M.D.   On: 07/25/2018 22:10   Dg Chest Port 1 View  Result Date: 07/08/2018 CLINICAL DATA:  Shortness of breath EXAM: PORTABLE CHEST 1 VIEW COMPARISON:  07/15/2018 FINDINGS: Cardiac shadow is enlarged but stable. Bilateral pleural effusions are noted right greater than left with associated underlying atelectatic changes. Increased vascular congestion is noted as well. IMPRESSION: Increasing changes of CHF with bilateral effusions. Bibasilar atelectatic changes are as well. Electronically Signed   By: Inez Catalina M.D.   On: 07/08/2018 15:11   Dg Chest Portable 1 View  Result Date: 07/14/2018 CLINICAL DATA:  Chest pain and shortness of breath. EXAM: PORTABLE CHEST 1 VIEW COMPARISON:  Chest x-Yomira dated June 14, 2018. FINDINGS: Stable cardiomegaly. Normal pulmonary vascularity. Increasing now moderate right pleural effusion with right basilar opacity. Unchanged small left pleural effusion with mild left lower lobe subsegmental atelectasis. No pneumothorax. No acute osseous abnormality. IMPRESSION: 1. Enlarging now moderate right pleural effusion with adjacent right basilar atelectasis versus consolidation. 2. Unchanged small left pleural effusion. Electronically Signed   By: Titus Dubin M.D.   On: 07/14/2018 14:01   Ir Thoracentesis Asp Pleural Space W/img Guide  Result Date: 07/15/2018 INDICATION: Patient with shortness of breath, right pleural effusion. Request made for diagnostic and therapeutic thoracentesis. EXAM: ULTRASOUND GUIDED RIGHT THORACENTESIS MEDICATIONS: 10 mL 1% lidocaine COMPLICATIONS: None immediate. PROCEDURE: An ultrasound guided thoracentesis was thoroughly discussed with the patient and questions answered. The benefits, risks, alternatives and complications were also  discussed. The patient understands and wishes to proceed with the procedure. Written consent was obtained. Ultrasound was performed to localize and mark an adequate pocket of fluid in the right chest. The area was then prepped and draped in the normal sterile fashion. 1% Lidocaine was used for local anesthesia. Under ultrasound guidance a 6 Fr Safe-T-Centesis catheter was introduced. Thoracentesis was performed. The catheter was removed and a dressing applied. FINDINGS: A total of approximately 1.0 liters of dark, yellow fluid was removed. Samples were sent to the laboratory as requested by the clinical team. IMPRESSION: Successful ultrasound guided diagnostic and therapeutic thoracentesis yielding 1.0 of pleural fluid. Read by: Brynda Greathouse PA-C Electronically Signed   By: Markus Daft M.D.   On: 07/15/2018 11:36    Orson Eva, DO  [facility] Pager [phone]  If 7PM-7AM, please contact night-coverage [email or web address] Password [email or web address] 07/27/2018, 9:02 AM   LOS: 4 days

## 2018-07-27 NOTE — Care Management Important Message (Signed)
Important Message  Patient Details  Name: [name] MRN: [id number] Date of Birth: May 09, 1934   Medicare Important Message Given:  Yes    [name] 07/27/2018, 3:10 PM

## 2018-07-27 NOTE — Progress Notes (Signed)
This RN spoke with [name] (pts daughter) about pts status. Adv daughter of pts increased oxygen requirements, Cardizem gtt, and diagnosis of HF. Daughter stated that no one has called her since Friday to tell her updates and the patient states she doesn't know what is going on.  [name] told this RN that pt had surgery back in March. Her PCP adv her not to do the surgery, but her retired and the physician that took over urged her to get it done. After a couple of admits to the hospital all pt wanted to do was lay in bed and not do anything. [name] also stated that pt has abused narcotics all her life and when she told pts PCP about it, he stated there was no point in taking her off of her mediations now since she is older. Daughter states that every time she is admitted to the hospital pt never sleeps because at home she takes 3 ambiens to sleep among other things and here she only takes one.  [name] would like to be called in the am about updates on mothers care.

## 2018-07-27 NOTE — Progress Notes (Addendum)
RT called d/t pt stating she couldn't breathe. O2 sats 84% on 9L HFNC- increased to 11L HFNC, pulled pt up and sat her up to high fowlers. Pt placed on bipap per PRN order. ** pt stated tonight that she wondered if she should keep fighting or not**  Will continue to monitor pt

## 2018-07-27 NOTE — Progress Notes (Signed)
Pt given 40mg  Lasix IV per MD order.  RR- 30 currently and pt continues to have labored breathing on 9L HFNC- o2 sats 94%. Will continue to monitor pt

## 2018-07-27 NOTE — Clinical Social Work Note (Signed)
Confirmed with patient that she is good with going to [facility] for rehab.  "It's close to my house.  I think that will be good."

## 2018-07-27 NOTE — Progress Notes (Signed)
PT Cancellation Note  Patient Details Name: [name] MRN: [id number] DOB: [birth year]   Cancelled Treatment:    Reason Eval/Treat Not Completed: Medical issues which prohibited therapy.  Patient transferred to a higher level of care and will need new PT consult resume therapy when patient is medically stable.  Thank you.   9:05 AM, 07/27/18 Lonell Grandchild, MPT Physical Therapist with [facility] [phone] office [phone] mobile phone

## 2018-07-28 ENCOUNTER — Inpatient Hospital Stay (HOSPITAL_COMMUNITY): Payer: Medicare Other

## 2018-07-28 ENCOUNTER — Encounter: Payer: Self-pay | Admitting: Physician Assistant

## 2018-07-28 DIAGNOSIS — Z7189 Other specified counseling: Secondary | ICD-10-CM

## 2018-07-28 DIAGNOSIS — Z515 Encounter for palliative care: Secondary | ICD-10-CM

## 2018-07-28 DIAGNOSIS — R0603 Acute respiratory distress: Secondary | ICD-10-CM

## 2018-07-28 DIAGNOSIS — I509 Heart failure, unspecified: Secondary | ICD-10-CM

## 2018-07-28 LAB — RENAL FUNCTION PANEL
Albumin: 4.7 g/dL (ref 3.5–5.0)
Anion gap: 15 (ref 5–15)
BUN: 26 mg/dL — ABNORMAL HIGH (ref 8–23)
CO2: 26 mmol/L (ref 22–32)
Calcium: 8.6 mg/dL — ABNORMAL LOW (ref 8.9–10.3)
Chloride: 92 mmol/L — ABNORMAL LOW (ref 98–111)
Creatinine, Ser: 1.38 mg/dL — ABNORMAL HIGH (ref 0.44–1.00)
GFR calc Af Amer: 41 mL/min — ABNORMAL LOW (ref >60)
GFR calc non Af Amer: 36 mL/min — ABNORMAL LOW (ref >60)
Glucose, Bld: 124 mg/dL — ABNORMAL HIGH (ref 70–99)
Phosphorus: 1.8 mg/dL — ABNORMAL LOW (ref 2.5–4.6)
Potassium: 3.9 mmol/L (ref 3.5–5.1)
Sodium: 133 mmol/L — ABNORMAL LOW (ref 135–145)

## 2018-07-28 MED ORDER — ONDANSETRON HCL 4 MG/2ML IJ SOLN: 4.0000 mg | Freq: Four times a day (QID) | INTRAMUSCULAR | Status: DC | PRN

## 2018-07-28 MED ORDER — MORPHINE BOLUS VIA INFUSION
2.0000 mg | INTRAVENOUS | Status: DC | PRN
  Filled 2018-07-28: qty 2

## 2018-07-28 MED ORDER — GLYCOPYRROLATE 0.2 MG/ML IJ SOLN: 0.2000 mg | INTRAMUSCULAR | Status: DC | PRN

## 2018-07-28 MED ORDER — MORPHINE SULFATE (PF) 2 MG/ML IV SOLN
1.0000 mg | INTRAVENOUS | Status: DC | PRN
  Administered 2018-07-28: 2 mg via INTRAVENOUS
  Filled 2018-07-28: qty 1

## 2018-07-28 MED ORDER — ACETAMINOPHEN 325 MG PO TABS: 650.0000 mg | ORAL_TABLET | Freq: Four times a day (QID) | ORAL | Status: DC | PRN

## 2018-07-28 MED ORDER — HALOPERIDOL LACTATE 2 MG/ML PO CONC
0.5000 mg | ORAL | Status: DC | PRN
  Filled 2018-07-28: qty 0.3

## 2018-07-28 MED ORDER — FUROSEMIDE 10 MG/ML IJ SOLN
80.0000 mg | Freq: Once | INTRAMUSCULAR | Status: AC
  Administered 2018-07-28: 80 mg via INTRAVENOUS
  Filled 2018-07-28: qty 8

## 2018-07-28 MED ORDER — MORPHINE SULFATE (PF) 4 MG/ML IV SOLN
4.0000 mg | INTRAVENOUS | Status: AC
  Administered 2018-07-28: 11:00:00 4 mg via INTRAVENOUS
  Filled 2018-07-28: qty 1

## 2018-07-28 MED ORDER — BIOTENE DRY MOUTH MT LIQD: 15.0000 mL | OROMUCOSAL | Status: DC | PRN

## 2018-07-28 MED ORDER — ACETAMINOPHEN 650 MG RE SUPP: 650.0000 mg | Freq: Four times a day (QID) | RECTAL | Status: DC | PRN

## 2018-07-28 MED ORDER — MORPHINE SULFATE (PF) 2 MG/ML IV SOLN
1.0000 mg | INTRAVENOUS | Status: DC | PRN
  Administered 2018-07-28 (×2): 2 mg via INTRAVENOUS
  Filled 2018-07-28 (×2): qty 1

## 2018-07-28 MED ORDER — MORPHINE 100MG IN NS 100ML (1MG/ML) PREMIX INFUSION
1.0000 mg/h | INTRAVENOUS | Status: DC
  Administered 2018-07-28: 1 mg/h via INTRAVENOUS
  Filled 2018-07-28: qty 100

## 2018-07-28 MED ORDER — HALOPERIDOL LACTATE 5 MG/ML IJ SOLN: 0.5000 mg | INTRAMUSCULAR | Status: DC | PRN

## 2018-07-28 MED ORDER — POLYVINYL ALCOHOL 1.4 % OP SOLN: 1.0000 [drp] | Freq: Four times a day (QID) | OPHTHALMIC | Status: DC | PRN

## 2018-07-28 MED ORDER — ONDANSETRON 4 MG PO TBDP: 4.0000 mg | ORAL_TABLET | Freq: Four times a day (QID) | ORAL | Status: DC | PRN

## 2018-07-28 MED ORDER — LORAZEPAM 2 MG/ML IJ SOLN: 0.5000 mg | INTRAMUSCULAR | Status: DC | PRN

## 2018-07-28 MED ORDER — GLYCOPYRROLATE 1 MG PO TABS: 1.0000 mg | ORAL_TABLET | ORAL | Status: DC | PRN

## 2018-07-28 MED ORDER — HALOPERIDOL 0.5 MG PO TABS: 0.5000 mg | ORAL_TABLET | ORAL | Status: DC | PRN

## 2018-07-31 NOTE — Significant Event (Addendum)
[name] is admitted with acute CHF, a fib with RVR, and acute hypoxic respiratory failure. Her respiratory status has been worsening with increasing oxygen requirements. She was given an extra dose of Lasix last night, has 300 cc UOP, but continues to require increased FiO2.   RN called with concern that sat remains in upper 80's on 15 Lpm HFNC, is refusing to go back on BiPAP, and has expressed that she is "ready to give up."   On exam, she is tachypneic in the 30's, saturating upper 80's on 15 Lpm, able to speak in full sentences, and A&O x4.   We discussed several options including a trial of BiPAP (with anti-anxiety medications and/or analgesia as needed to tolerate), intubation and mechanical ventilation, or focus more on symptom-management than trying to fix the underlying illness. She expresses a desire to "just keep me comfortable," relating that her quality of life has been very poor and expressing her understanding of the options. She understands that by focusing on symptom-management and comfort, her underlying disease process will likely worsen. She no longer wants to be full code. RN Ebony Hail was present during the discussion and has been keeping the patient's daughter abreast of these developments.   Plan to try Lasix 80 mg IVP now, check CXR (though clinically she has worsening CHF), use analgesia and anxiolytics as needed.

## 2018-07-31 NOTE — Progress Notes (Signed)
   Progress Note  Patient Name: [name] Date of Encounter: 08/25/2018  Primary Cardiologist: Kate Sable, MD  Chart reviewed since follow-up yesterday.  Dr. Doristine Devoid note from today also reviewed.  Patient has had significant clinical decline with progressive hypoxic respiratory failure, plan is now for comfort care.  I note that her oral cardiac medications have been discontinued.  DNR status in place.  We will sign off at this point.  Signed, Rozann Lesches, MD  2018-08-25, 9:04 AM

## 2018-07-31 NOTE — Progress Notes (Signed)
Dr. Myna Hidalgo came up and spoke with patient about options (bipap, ventilation, being comfortable). Pt stated she wanted to remain comfortable and not have anything invasive.  Code status changed to DNR. This RN called and spoke with patients daughter [name] about changes in patients status as well as code status change. Family would like to do a video call with patient this morning after 10am- This RN gave information on that.  DNR bracelet placed on patient- Lasix 80mg  IV given as well Morphing 2mg  IV for comfort. Pt continues to be on 15L HFNC with O2 sats 88%. Will continue to monitor pt.

## 2018-07-31 NOTE — Consult Note (Signed)
Consultation Note Date: 2018/08/11   Patient Name: [name]  DOB: 1934-05-16  MRN: [id number]  Age / Sex: 84 y.o., female  PCP: Terald Sleeper, PA-C Referring Physician: Orson Eva, MD  Reason for Consultation: Establishing goals of care  HPI/Patient Profile: 84 y.o. female  with past medical history of colon cancer, CKD III, HTN, pulmonary htn, chronic a fib, recent NSTEMI, recent hospitalization in March for hernia repair for which recovery was complicated by a. Fib and acute on chronic kidney injury, recent admission on 4/14 for HCAP and now  admitted on 07/21/2018 with respiratory distress and hypoxia.  Workup revealed acute CHF with volume overload- initially responding well to diuresis, found to have prolonged QT interval- medications were adjusted. In the evening of 4/27 she again developed respiratory distress, and per primary team in the morning of 4/28 in discussion with patient and patient and her daughters- patient was made DNR status and transitioned to full comfort care.      Clinical Assessment and Goals of Care: Patient's daughters at bedside. Patient in bed- visibly SOB, she states she is uncomfortable.  Patient's daughters confirm GOC is comfort with natural death. We discussed starting morphine infusion for patient comfort and the need to possibly increase doses. Discussed that the need to increase doses to maintain comfort may overtake the patient's ability to stay awake. Patient and daughters stated understanding and verbalized that comfort was priority. They also requested that patient be removed from telemetry wires.   Primary Decision Maker PATIENT with support of her children    SUMMARY OF RECOMMENDATIONS - Morphine 4mg  IV push now -Will start morphine continuous infusion 1mg /hr with 2mg  IV bolus q9min prn -Insert foley catheter -Continue lorazepam as ordered  -Robinul .2mg  IV q4hr  prn secretions -D/C telemetry -Comfort measures     Code Status/Advance Care Planning:  DNR  Additional Recommendations (Limitations, Scope, Preferences):  Full Comfort Care  Prognosis:    Hours - Days  Discharge Planning: Anticipated Hospital Death  Primary Diagnoses: Present on Admission: . Volume overload . Acute diastolic (congestive) heart failure ([place]) . AF (paroxysmal atrial fibrillation) ([place]) . Anxiety and depression . Anemia due to chronic blood loss . Coronary artery disease . Essential hypertension . Hypothyroidism . GERD (gastroesophageal reflux disease) s/p Toupet fundoplication (partial posterior wrap) . HYPERCHOLESTEROLEMIA . Elevated troponin   I have reviewed the medical record, interviewed the patient and family, and examined the patient. The following aspects are pertinent.   Physical Exam Vitals signs and nursing note reviewed.  Constitutional:      Comments: Frail, thin  Cardiovascular:     Comments: irregular Pulmonary:     Comments: Increased rate and effort Abdominal:     General: There is distension.  Skin:    Coloration: Skin is pale.     Vital Signs: BP 101/70   Pulse 88   Temp (!) 97.5 F (36.4 C) (Axillary)   Resp 17   Ht 5\' 2"  (1.575 m)   Wt 66 kg   SpO2 Marland Kitchen)  82%   BMI 26.61 kg/m  Pain Scale: 0-10 POSS *See Group Information*: 1-Acceptable,Awake and alert Pain Score: Asleep   SpO2: SpO2: (!) 82 % O2 Device:SpO2: (!) 82 % O2 Flow Rate: .O2 Flow Rate (L/min): 10 L/min  IO: Intake/output summary:   Intake/Output Summary (Last 24 hours) at 07-29-18 1127 Last data filed at 07-29-2018 0900 Gross per 24 hour  Intake 809.75 ml  Output 850 ml  Net -40.25 ml    LBM: Last BM Date: 07/27/18 Baseline Weight: Weight: 67 kg Most recent weight: Weight: 66 kg     Palliative Assessment/Data: PPS: 10%     Thank you for this consult. Palliative medicine will continue to follow and assist as needed.   Time In: 1015   Time Out: 1115 Time Total: 60 minutes Greater than 50%  of this time was spent counseling and coordinating care related to the above assessment and plan.  Signed by: Mariana Kaufman, AGNP-C Palliative Medicine    Please contact Palliative Medicine Team phone at [phone] for questions and concerns.  For individual provider: See Shea Evans

## 2018-07-31 NOTE — Progress Notes (Signed)
CRITICAL VALUE ALERT  Critical Value:  EKG- critical Long QTC 456/566  Date & Time Notied:  2018-08-07 @ 8412  Provider Notified: Dr. Myna Hidalgo  Orders Received/Actions taken: Waiting for orders/call back

## 2018-07-31 NOTE — Progress Notes (Signed)
Pt stated "I am about ready to give up." Pt states she does not want to wear bipap anymore- she is now on 15L HFNC and O2 sats 86%. Pt situated in bed- dr. Myna Hidalgo paged to make aware. Daughter-Teresa called to make aware of what her mother is saying.

## 2018-07-31 NOTE — Progress Notes (Addendum)
Patient passed away with family at bedside at 1328. MD notified. Body prepared and transported to morgue. Morphine drip wasted (43mL) by this RN with Loni Muse, RN [phone]) as witness.   Celestia Khat, RN

## 2018-07-31 NOTE — Progress Notes (Signed)
PROGRESS NOTE  [name] FFM:[id number] DOB: 12/28/1934 DOA: 07/15/2018 PCP: Terald Sleeper, PA-C  Brief History:  84 year old female with a history of chronic blood loss anemia, anxiety, atrophic left kidney, colon cancer status post colostomy, CKD stage III, GERD, [name]'s esophagus, hypertension, hypothyroidism, pulmonary hypertension, and paroxysmal atrial fibrillation presenting with generalized weakness and shortness of breath.  The patient had oxygen saturation of 86% upon EMS arrival.  She had denied a fevers, sore throat, wheezing, coughing, hemoptysis.  This represents the patient's fourth admission in the past 6 weeks.  The patient was initially admitted in March for robotic repair ofincarcerated recurrent hiatal hernia that was complicated by postop A. fib with marked "asymptomatic" elevation of troponin to 13.58 andstarted on amiodarone and Eliquis. She was readmitteda few days later after discharge due to paroxysmal A. fib with RVR and acute kidney injury. She was supposed to have TEE cardioversion, but subsequently converted to sinus rhythm. She was readmitted on 07/14/2018 for a total of 4 days after presenting there with dyspnea and she was treated for HCAP. COVID-19 test was negative.  Upon presentation, the patient was noted to have BNP 2781.  Chest x-Jaide showed pulmonary edema and bilateral pleural effusions.  She was started on IV furosemide.  Cardiology was consulted to assist with management.  In the early morning 25-Aug-2018, the patient developed dyspnea and respiratory distress and hypoxia.  The patient was given additional furosemide.  Goals of care was discussed with the patient and daughter.    I discussed with the patient and daughter advanced care directives.  In the setting of the patient's advanced age, multiple core morbidities, failure to thrive symptoms, and persistent respiratory failure and distress despite optimal therapy, the patient expressed desire to  transition her focus of care to full comfort to maintain dignity and to allow for natural death.  This was discussed with the patient's daughter who agreed.  As result, medications with curative intent were discontinued to allow focus on the patient's comfort.  All laboratory and diagnostic studies were discontinued.  Assessment/Plan: Acute on chronic respiratory failure with hypoxia -Patient is on 2 L nasal cannula baseline -Secondary to pulmonary edema pleural effusions -Currently on 7 L HFNC -Wean oxygen for saturation greater 92% -early am 4/28--respiratory distress - I discussed with the patient and daughter advanced care directives.  In the setting of the patient's advanced age, multiple core morbidities, failure to thrive symptoms, and persistent respiratory failure and distress despite optimal therapy, the patient expressed desire to transition her focus of care to full comfort to maintain dignity and to allow for natural death.  This was discussed with the patient's daughter who agreed.  As result, medications with curative intent were discontinued to allow focus on the patient's comfort.  All laboratory and diagnostic studies were discontinued.  Acute on chronic diastolic CHF -I/Os incomplete -Continue IV furosemide -Weight has improved from 153.6-->147.7 lbs -06/07/2018 echo EF 60 to 65%, no WMA -remains fluid overloaded - I discussed with the patient and daughter advanced care directives.  In the setting of the patient's advanced age, multiple core morbidities, failure to thrive symptoms, and persistent respiratory failure and distress despite optimal therapy, the patient expressed desire to transition her focus of care to full comfort to maintain dignity and to allow for natural death.  This was discussed with the patient's daughter who agreed.  As result, medications with curative intent were discontinued to allow focus on  the patient's comfort.  All laboratory and diagnostic studies  were discontinued.  Persistent atrial fibrillation -Concerned this may be contributing to the patient's fluid overload -Cardiology consultation appreciated-->treat medically -I discussed with the patient and daughter advanced care directives.  In the setting of the patient's advanced age, multiple core morbidities, failure to thrive symptoms, and persistent respiratory failure and distress despite optimal therapy, the patient expressed desire to transition her focus of care to full comfort to maintain dignity and to allow for natural death.  This was discussed with the patient's daughter who agreed.  As result, medications with curative intent were discontinued to allow focus on the patient's comfort.  All laboratory and diagnostic studies were discontinued. -disContinue diltiazem IV -disContinue metoprolol succinate -disContinue apixaban -disContinue amiodarone  Essential hypertension -Losartan and Imdur stopped to allow for margin for titration of AV nodal agents -Blood pressure controlled  CKD stage III -Baseline creatinine 1.1-1.4 -Monitor with diuresis  Elevated troponin -Not consistent with ACS -Trend is flat -Secondary to CHF and tachycardia  Hyponatremia -Secondary to fluid overload -Continue diuresis  Hypothyroidism -Continue levothyroxine  Hyperlipidemia -Continue statin  Prolonged QTc -celexa and protonix discontinued  Goals of Care -FULL COMFORT  I discussed with the patient and daughter advanced care directives.  In the setting of the patient's advanced age, multiple core morbidities, failure to thrive symptoms, and persistent respiratory failure and distress despite optimal therapy, the patient expressed desire to transition her focus of care to full comfort to maintain dignity and to allow for natural death.  This was discussed with the patient's daughter who agreed.  As result, medications with curative intent were discontinued to allow focus on the  patient's comfort.  All laboratory and diagnostic studies were discontinued.    Disposition Plan:   In hospital death Family Communication:   Daughter updated on phone  Consultants:  cardiology  Code Status:  FULL COMFORT  DVT Prophylaxis:  FULL COMFORT   Procedures: As Listed in Progress Note Above  Antibiotics: None      Subjective: Patient continues to complain some shortness of breath.  She is requesting IV morphine.  She denies any chest pain, abdominal pain, headache.  She was requesting to transition to full comfort and wants to remain comfortable without any struggling.  The patient states "I am ready to go.  I just want to be comfortable."  Objective: Vitals:   Aug 21, 2018 0700 Aug 21, 2018 0730 21-Aug-2018 0800 08/21/2018 0830  BP: (!) 93/58 90/65 113/66 101/70  Pulse: 94 76 85 90  Resp: 20 17 (!) 23 (!) 26  Temp:  (!) 97.5 F (36.4 C)    TempSrc:  Axillary    SpO2: 90% (!) 86% (!) 87% (!) 88%  Weight:      Height:        Intake/Output Summary (Last 24 hours) at Aug 21, 2018 0841 Last data filed at Aug 21, 2018 0500 Gross per 24 hour  Intake 696.18 ml  Output 1800 ml  Net -1103.82 ml   Weight change: -1 kg Exam:   General:  Pt is alert, follows commands appropriately, not in acute distress  HEENT: No icterus, No thrush, No neck mass, [place]/AT  Cardiovascular: IRRR, S1/S2, no rubs, no gallops  Respiratory: bilateral crackles  Abdomen: Soft/+BS, non tender, non distended, no guarding  Extremities: trace LE edema, No lymphangitis, No petechiae, No rashes, no synovitis   Data Reviewed: I have personally reviewed following labs and imaging studies Basic Metabolic Panel: Recent Labs  Lab 07/17/2018 1455  07/24/18 0426  07/25/18 0358 07/25/18 1124 07/26/18 0656 07/27/18 0536 2018-08-04 0439  NA 132*   < > 135 132*  --  131* 132*   132* 133*  K 4.6   < > 3.7 3.1*  --  5.1 4.5   4.5 3.9  CL 99   < > 100 95*  --  95* 94*   94* 92*  CO2 23   < > 24 26  --  26 27    28 26   GLUCOSE 115*   < > 80 102*  --  112* 114*   115* 124*  BUN 21   < > 24* 24*  --  24* 24*   24* 26*  CREATININE 1.31*   < > 1.22* 1.18*  --  1.20* 1.31*   1.31* 1.38*  CALCIUM 8.4*   < > 7.9* 7.8*  --  7.8* 8.1*   8.1* 8.6*  MG 2.2  --   --   --  2.1 2.1 2.1  --   PHOS  --   --   --   --   --  1.6* 1.7* 1.8*   < > = values in this interval not displayed.   Liver Function Tests: Recent Labs  Lab 07/07/2018 1455 07/26/18 0656 07/27/18 0536 08/04/18 0439  AST 40  --   --   --   ALT 28  --   --   --   ALKPHOS 138*  --   --   --   BILITOT 0.8  --   --   --   PROT 6.9  --   --   --   ALBUMIN 3.0* 2.5* 3.2* 4.7   No results for input(s): LIPASE, AMYLASE in the last 168 hours. No results for input(s): AMMONIA in the last 168 hours. Coagulation Profile: No results for input(s): INR, PROTIME in the last 168 hours. CBC: Recent Labs  Lab 07/03/2018 1455 07/24/18 0426 07/27/18 0536  WBC 14.6* 11.7* 11.7*  NEUTROABS 11.9*  --  9.4*  HGB 10.0* 8.8* 8.6*  HCT 33.2* 30.0* 28.6*  MCV 83.8 85.2 82.7  PLT 404* 328 319   Cardiac Enzymes: Recent Labs  Lab 07/17/2018 1455 07/05/2018 1738 07/23/18 0038 07/23/18 0626  TROPONINI 0.06* 0.05* 0.06* 0.05*   BNP: Invalid input(s): POCBNP CBG: No results for input(s): GLUCAP in the last 168 hours. HbA1C: No results for input(s): HGBA1C in the last 72 hours. Urine analysis:    Component Value Date/Time   COLORURINE YELLOW 07/17/2018 1723   APPEARANCEUR CLEAR 07/05/2018 1723   APPEARANCEUR Clear 05/13/2016 0856   LABSPEC 1.012 07/16/2018 1723   PHURINE 5.0 07/03/2018 1723   GLUCOSEU NEGATIVE 07/15/2018 1723   HGBUR SMALL (A) 07/26/2018 1723   BILIRUBINUR NEGATIVE 07/23/2018 1723   BILIRUBINUR Negative 05/13/2016 0856   KETONESUR NEGATIVE 07/29/2018 1723   PROTEINUR NEGATIVE 07/04/2018 1723   UROBILINOGEN 0.2 02/10/2015 1936   NITRITE NEGATIVE 07/23/2018 1723   LEUKOCYTESUR NEGATIVE 07/16/2018 1723   Sepsis  Labs: @LABRCNTIP (procalcitonin:4,lacticidven:4) ) Recent Results (from the past 240 hour(s))  Culture, blood (routine x 2)     Status: None   Collection Time: 07/07/2018  3:15 PM  Result Value Ref Range Status   Specimen Description RIGHT ANTECUBITAL  Final   Special Requests   Final    BOTTLES DRAWN AEROBIC AND ANAEROBIC Blood Culture adequate volume   Culture   Final    NO GROWTH 5 DAYS Performed at [facility], [address], [place], [place] [address]  Report Status 07/27/2018 FINAL  Final  Culture, blood (routine x 2)     Status: None   Collection Time: 07/03/2018  3:15 PM  Result Value Ref Range Status   Specimen Description BLOOD RIGHT WRIST  Final   Special Requests   Final    BOTTLES DRAWN AEROBIC AND ANAEROBIC Blood Culture adequate volume   Culture   Final    NO GROWTH 5 DAYS Performed at [facility], [address], [place], [place] [address]    Report Status 07/27/2018 FINAL  Final  MRSA PCR Screening     Status: None   Collection Time: 07/24/18  2:08 PM  Result Value Ref Range Status   MRSA by PCR NEGATIVE NEGATIVE Final    Comment:        The GeneXpert MRSA Assay (FDA approved for NASAL specimens only), is one component of a comprehensive MRSA colonization surveillance program. It is not intended to diagnose MRSA infection nor to guide or monitor treatment for MRSA infections. Performed at [facility], [address] [place], [place] [address]      Scheduled Meds:  mouth rinse  15 mL Mouth Rinse BID   Continuous Infusions:  sodium chloride Stopped (07/24/2018 2016)   albumin human 25 g (Aug 04, 2018 0508)   diltiazem (CARDIZEM) infusion 5 mg/hr (2018-08-04 0504)    Procedures/Studies: Dg Chest 1 View  Result Date: 07/15/2018 CLINICAL DATA:  Followup right thoracentesis EXAM: CHEST  1 VIEW COMPARISON:  07/14/2018 FINDINGS: There is less pleural fluid on the right with better aeration of the right lung. Small to moderate effusion does persist. There is  a small effusion on the left with left base atelectasis. IMPRESSION: Reduction in amount of pleural fluid on the right with better aeration of the right lung. No visible pneumothorax. Small to moderate right effusion does persist with right base volume loss. Small effusion persists on the left. Electronically Signed   By: Nelson Chimes M.D.   On: 07/15/2018 10:52   Dg Chest Port 1 View  Result Date: 04-Aug-2018 CLINICAL DATA:  Acute respiratory distress. EXAM: PORTABLE CHEST 1 VIEW COMPARISON:  Radiograph of July 27, 2018. FINDINGS: Stable cardiomegaly. No pneumothorax is noted. Stable bilateral interstitial opacities are noted throughout both lungs with pleural effusions concerning for pulmonary edema. Bony thorax is unremarkable. IMPRESSION: Stable bilateral pulmonary and pleural effusions. Electronically Signed   By: Marijo Conception M.D.   On: 2018/08/04 07:13   Dg Chest Port 1 View  Result Date: 07/27/2018 CLINICAL DATA:  Congestive heart failure. EXAM: PORTABLE CHEST 1 VIEW COMPARISON:  Radiograph of July 25, 2018. FINDINGS: Stable cardiomegaly. Stable diffuse interstitial opacities are noted throughout both lungs concerning for edema. Stable bilateral pleural effusions are noted, right greater than left. No pneumothorax is noted. Bony thorax is unremarkable. IMPRESSION: Stable cardiomegaly with bilateral pulmonary edema and pleural effusions. Electronically Signed   By: Marijo Conception M.D.   On: 07/27/2018 07:42   Dg Chest Port 1 View  Result Date: 07/25/2018 CLINICAL DATA:  Dyspnea EXAM: PORTABLE CHEST 1 VIEW COMPARISON:  07/03/2018 FINDINGS: Cardiomegaly with small pleural effusions and severe pulmonary edema, unchanged. IMPRESSION: Unchanged severe CHF. Electronically Signed   By: Ulyses Jarred M.D.   On: 07/25/2018 22:10   Dg Chest Port 1 View  Result Date: 07/30/2018 CLINICAL DATA:  Shortness of breath EXAM: PORTABLE CHEST 1 VIEW COMPARISON:  07/15/2018 FINDINGS: Cardiac shadow is  enlarged but stable. Bilateral pleural effusions are noted right greater than left with  associated underlying atelectatic changes. Increased vascular congestion is noted as well. IMPRESSION: Increasing changes of CHF with bilateral effusions. Bibasilar atelectatic changes are as well. Electronically Signed   By: Inez Catalina M.D.   On: 07/03/2018 15:11   Dg Chest Portable 1 View  Result Date: 07/14/2018 CLINICAL DATA:  Chest pain and shortness of breath. EXAM: PORTABLE CHEST 1 VIEW COMPARISON:  Chest x-[place] dated June 14, 2018. FINDINGS: Stable cardiomegaly. Normal pulmonary vascularity. Increasing now moderate right pleural effusion with right basilar opacity. Unchanged small left pleural effusion with mild left lower lobe subsegmental atelectasis. No pneumothorax. No acute osseous abnormality. IMPRESSION: 1. Enlarging now moderate right pleural effusion with adjacent right basilar atelectasis versus consolidation. 2. Unchanged small left pleural effusion. Electronically Signed   By: Titus Dubin M.D.   On: 07/14/2018 14:01   Ir Thoracentesis Asp Pleural Space W/img Guide  Result Date: 07/15/2018 INDICATION: Patient with shortness of breath, right pleural effusion. Request made for diagnostic and therapeutic thoracentesis. EXAM: ULTRASOUND GUIDED RIGHT THORACENTESIS MEDICATIONS: 10 mL 1% lidocaine COMPLICATIONS: None immediate. PROCEDURE: An ultrasound guided thoracentesis was thoroughly discussed with the patient and questions answered. The benefits, risks, alternatives and complications were also discussed. The patient understands and wishes to proceed with the procedure. Written consent was obtained. Ultrasound was performed to localize and mark an adequate pocket of fluid in the right chest. The area was then prepped and draped in the normal sterile fashion. 1% Lidocaine was used for local anesthesia. Under ultrasound guidance a 6 Fr Safe-T-Centesis catheter was introduced. Thoracentesis was  performed. The catheter was removed and a dressing applied. FINDINGS: A total of approximately 1.0 liters of dark, yellow fluid was removed. Samples were sent to the laboratory as requested by the clinical team. IMPRESSION: Successful ultrasound guided diagnostic and therapeutic thoracentesis yielding 1.0 of pleural fluid. Read by: Brynda Greathouse PA-C Electronically Signed   By: Markus Daft M.D.   On: 07/15/2018 11:36    Orson Eva, DO  [facility] Pager [phone]  If 7PM-7AM, please contact night-coverage [email or web address] Password [facility] 08-04-2018, 8:41 AM   LOS: 5 days

## 2018-07-31 DEATH — deceased

## 2018-08-31 ENCOUNTER — Ambulatory Visit: Payer: Medicare Other | Admitting: Hematology

## 2018-08-31 ENCOUNTER — Other Ambulatory Visit: Payer: Medicare Other

## 2018-08-31 NOTE — Death Summary Note (Signed)
DEATH SUMMARY   Patient Details  Name: [name] MRN: [id number] DOB: November 07, 1934  Admission/Discharge Information   Admit Date:  05-Aug-2018  Date of Death: Date of Death: 11-Aug-2018  Time of Death: Time of Death: 07/08/1326  Length of Stay: 5  Referring Physician: Terald Sleeper, PA-C   Reason(s) for Hospitalization  dyspnea  Diagnoses  Preliminary cause of death: acute on chronic CHF Secondary Diagnoses (including complications and co-morbidities):  Acute on chronic respiratory failure with hypoxia -Patient is on 2 L nasal cannula baseline -Secondary to pulmonary edema pleural effusions -Currently on 7 LHFNC -Wean oxygen for saturation greater 92% -early am 4/28--respiratory distress - I discussed with the patient and daughter advanced care directives.  In the setting of the patient's advanced age, multiple core morbidities, failure to thrive symptoms, and persistent respiratory failure and distress despite optimal therapy, the patient expressed desire to transition her focus of care to full comfort to maintain dignity and to allow for natural death.  This was discussed with the patient's daughter who agreed.  As result, medications with curative intent were discontinued to allow focus on the patient's comfort.  All laboratory and diagnostic studies were discontinued.  Acute on chronic diastolic CHF -I/Os incomplete -Continue IV furosemide -Weight has improved from 153.6-->147.7 lbs -06/07/2018 echo EF 60 to 65%, no WMA -remains fluid overloaded - I discussed with the patient and daughter advanced care directives.  In the setting of the patient's advanced age, multiple core morbidities, failure to thrive symptoms, and persistent respiratory failure and distress despite optimal therapy, the patient expressed desire to transition her focus of care to full comfort to maintain dignity and to allow for natural death.  This was discussed with the patient's daughter who agreed.  As result,  medications with curative intent were discontinued to allow focus on the patient's comfort.  All laboratory and diagnostic studies were discontinued.  Persistent atrial fibrillation -Concerned this may be contributing to the patient's fluid overload -Cardiology consultationappreciated-->treat medically -I discussed with the patient and daughter advanced care directives.  In the setting of the patient's advanced age, multiple core morbidities, failure to thrive symptoms, and persistent respiratory failure and distress despite optimal therapy, the patient expressed desire to transition her focus of care to full comfort to maintain dignity and to allow for natural death.  This was discussed with the patient's daughter who agreed.  As result, medications with curative intent were discontinued to allow focus on the patient's comfort.  All laboratory and diagnostic studies were discontinued. -disContinue diltiazem IV -disContinue metoprolol succinate -disContinue apixaban -disContinue amiodarone  Essential hypertension -Losartan and Imdur stopped to allow for margin for titration of AV nodal agents -Blood pressure controlled  CKD stage III -Baseline creatinine 1.1-1.4 -Monitor with diuresis  Elevated troponin -Not consistent with ACS -Trend is flat -Secondary to CHF and tachycardia  Hyponatremia -Secondary to fluid overload -Continue diuresis  Hypothyroidism -Continue levothyroxine  Hyperlipidemia -Continue statin  Prolonged QTc -celexa and protonix discontinued  Goals of Care -FULL COMFORT  I discussed with the patient and daughter advanced care directives.  In the setting of the patient's advanced age, multiple core morbidities, failure to thrive symptoms, and persistent respiratory failure and distress despite optimal therapy, the patient expressed desire to transition her focus of care to full comfort to maintain dignity and to allow for natural death.  This was  discussed with the patient's daughter who agreed.  As result, medications with curative intent were discontinued to allow focus on the  patient's comfort.  All laboratory and diagnostic studies were discontinued.   Brief Hospital Course (including significant findings, care, treatment, and services provided and events leading to death)  84 year old female with a history of chronic blood loss anemia, anxiety, atrophic left kidney, colon cancer status post colostomy, CKD stage III, GERD, [name]'s esophagus, hypertension, hypothyroidism, pulmonary hypertension, and paroxysmal atrial fibrillation presenting with generalized weakness and shortness of breath. The patient had oxygen saturation of 86% upon EMS arrival. She had denied a fevers, sore throat, wheezing, coughing, hemoptysis. This represents the patient's fourthadmission in the past 6 weeks.The patient was initially admitted in [facility] ofincarcerated recurrent hiatal hernia that was complicated by postop A. fib with marked "asymptomatic" elevation of troponin to 13.58 andstarted on amiodarone and Eliquis. She was readmitteda few days later after discharge due to paroxysmal A. fib with RVR and acute kidney injury. She was supposed to have TEE cardioversion, but subsequently converted to sinus rhythm. She was readmitted on 07/14/2018 for a total of 4 days after presenting there with dyspneaand she was treated for HCAP. COVID-19 test was negative.Upon presentation, the patient was noted to have BNP 2781. Chest x-Marielle showed pulmonary edema and bilateral pleural effusions. She was started on IV furosemide.Cardiology was consulted to assist with management.  In the early morning Aug 27, 2018, the patient developed dyspnea and respiratory distress and hypoxia.  The patient was given additional furosemide.  Goals of care was discussed with the patient and daughter.    I discussed with the patient and daughter advanced care  directives.  In the setting of the patient's advanced age, multiple core morbidities, failure to thrive symptoms, and persistent respiratory failure and distress despite optimal therapy, the patient expressed desire to transition her focus of care to full comfort to maintain dignity and to allow for natural death.  This was discussed with the patient's daughter who agreed.  As result, medications with curative intent were discontinued to allow focus on the patient's comfort.  All laboratory and diagnostic studies were discontinued.   Pertinent Labs and Studies  Significant Diagnostic Studies Dg Chest 1 View  Result Date: 07/15/2018 CLINICAL DATA:  Followup right thoracentesis EXAM: CHEST  1 VIEW COMPARISON:  07/14/2018 FINDINGS: There is less pleural fluid on the right with better aeration of the right lung. Small to moderate effusion does persist. There is a small effusion on the left with left base atelectasis. IMPRESSION: Reduction in amount of pleural fluid on the right with better aeration of the right lung. No visible pneumothorax. Small to moderate right effusion does persist with right base volume loss. Small effusion persists on the left. Electronically Signed   By: Nelson Chimes M.D.   On: 07/15/2018 10:52   Dg Chest Port 1 View  Result Date: 2018/08/27 CLINICAL DATA:  Acute respiratory distress. EXAM: PORTABLE CHEST 1 VIEW COMPARISON:  Radiograph of July 27, 2018. FINDINGS: Stable cardiomegaly. No pneumothorax is noted. Stable bilateral interstitial opacities are noted throughout both lungs with pleural effusions concerning for pulmonary edema. Bony thorax is unremarkable. IMPRESSION: Stable bilateral pulmonary and pleural effusions. Electronically Signed   By: Marijo Conception M.D.   On: 08/27/18 07:13   Dg Chest Port 1 View  Result Date: 07/27/2018 CLINICAL DATA:  Congestive heart failure. EXAM: PORTABLE CHEST 1 VIEW COMPARISON:  Radiograph of July 25, 2018. FINDINGS: Stable  cardiomegaly. Stable diffuse interstitial opacities are noted throughout both lungs concerning for edema. Stable bilateral pleural effusions are noted, right greater than left. No pneumothorax  is noted. Bony thorax is unremarkable. IMPRESSION: Stable cardiomegaly with bilateral pulmonary edema and pleural effusions. Electronically Signed   By: Marijo Conception M.D.   On: 07/27/2018 07:42   Dg Chest Port 1 View  Result Date: 07/25/2018 CLINICAL DATA:  Dyspnea EXAM: PORTABLE CHEST 1 VIEW COMPARISON:  07/05/2018 FINDINGS: Cardiomegaly with small pleural effusions and severe pulmonary edema, unchanged. IMPRESSION: Unchanged severe CHF. Electronically Signed   By: Ulyses Jarred M.D.   On: 07/25/2018 22:10   Dg Chest Port 1 View  Result Date: 07/10/2018 CLINICAL DATA:  Shortness of breath EXAM: PORTABLE CHEST 1 VIEW COMPARISON:  07/15/2018 FINDINGS: Cardiac shadow is enlarged but stable. Bilateral pleural effusions are noted right greater than left with associated underlying atelectatic changes. Increased vascular congestion is noted as well. IMPRESSION: Increasing changes of CHF with bilateral effusions. Bibasilar atelectatic changes are as well. Electronically Signed   By: Inez Catalina M.D.   On: 07/10/2018 15:11   Dg Chest Portable 1 View  Result Date: 07/14/2018 CLINICAL DATA:  Chest pain and shortness of breath. EXAM: PORTABLE CHEST 1 VIEW COMPARISON:  Chest x-Belvia dated June 14, 2018. FINDINGS: Stable cardiomegaly. Normal pulmonary vascularity. Increasing now moderate right pleural effusion with right basilar opacity. Unchanged small left pleural effusion with mild left lower lobe subsegmental atelectasis. No pneumothorax. No acute osseous abnormality. IMPRESSION: 1. Enlarging now moderate right pleural effusion with adjacent right basilar atelectasis versus consolidation. 2. Unchanged small left pleural effusion. Electronically Signed   By: Titus Dubin M.D.   On: 07/14/2018 14:01   Ir Thoracentesis  Asp Pleural Space W/img Guide  Result Date: 07/15/2018 INDICATION: Patient with shortness of breath, right pleural effusion. Request made for diagnostic and therapeutic thoracentesis. EXAM: ULTRASOUND GUIDED RIGHT THORACENTESIS MEDICATIONS: 10 mL 1% lidocaine COMPLICATIONS: None immediate. PROCEDURE: An ultrasound guided thoracentesis was thoroughly discussed with the patient and questions answered. The benefits, risks, alternatives and complications were also discussed. The patient understands and wishes to proceed with the procedure. Written consent was obtained. Ultrasound was performed to localize and mark an adequate pocket of fluid in the right chest. The area was then prepped and draped in the normal sterile fashion. 1% Lidocaine was used for local anesthesia. Under ultrasound guidance a 6 Fr Safe-T-Centesis catheter was introduced. Thoracentesis was performed. The catheter was removed and a dressing applied. FINDINGS: A total of approximately 1.0 liters of dark, yellow fluid was removed. Samples were sent to the laboratory as requested by the clinical team. IMPRESSION: Successful ultrasound guided diagnostic and therapeutic thoracentesis yielding 1.0 of pleural fluid. Read by: Brynda Greathouse PA-C Electronically Signed   By: Markus Daft M.D.   On: 07/15/2018 11:36    Microbiology No results found for this or any previous visit (from the past 240 hour(s)).  Lab Basic Metabolic Panel: No results for input(s): NA, K, CL, CO2, GLUCOSE, BUN, CREATININE, CALCIUM, MG, PHOS in the last 168 hours. Liver Function Tests: No results for input(s): AST, ALT, ALKPHOS, BILITOT, PROT, ALBUMIN in the last 168 hours. No results for input(s): LIPASE, AMYLASE in the last 168 hours. No results for input(s): AMMONIA in the last 168 hours. CBC: No results for input(s): WBC, NEUTROABS, HGB, HCT, MCV, PLT in the last 168 hours. Cardiac Enzymes: No results for input(s): CKTOTAL, CKMB, CKMBINDEX, TROPONINI in the last  168 hours. Sepsis Labs: No results for input(s): PROCALCITON, WBC, LATICACIDVEN in the last 168 hours.  Procedures/Operations  As above    Shanon Brow Santo Zahradnik 08/08/2018, 5:20 PM

## 2018-09-04 ENCOUNTER — Ambulatory Visit: Payer: Medicare Other | Admitting: Physician Assistant

## 2018-10-07 ENCOUNTER — Ambulatory Visit: Payer: Medicare Other | Admitting: Cardiovascular Disease

## 2018-11-26 ENCOUNTER — Encounter (INDEPENDENT_AMBULATORY_CARE_PROVIDER_SITE_OTHER): Payer: Medicare Other | Admitting: Ophthalmology

## 2019-02-04 ENCOUNTER — Encounter: Payer: Medicare Other | Admitting: *Deleted
